# Patient Record
Sex: Female | Born: 1985
Health system: Southern US, Community
[De-identification: ages and names within clinical notes are randomized; demographics above are authoritative.]

## PROBLEM LIST (undated history)

## (undated) DIAGNOSIS — B370 Candidal stomatitis: Secondary | ICD-10-CM

## (undated) DIAGNOSIS — E871 Hypo-osmolality and hyponatremia: Secondary | ICD-10-CM

## (undated) DIAGNOSIS — B59 Pneumocystosis: Secondary | ICD-10-CM

## (undated) DIAGNOSIS — N390 Urinary tract infection, site not specified: Secondary | ICD-10-CM

## (undated) DIAGNOSIS — Z21 Asymptomatic human immunodeficiency virus [HIV] infection status: Secondary | ICD-10-CM

## (undated) DIAGNOSIS — R091 Pleurisy: Secondary | ICD-10-CM

## (undated) DIAGNOSIS — L309 Dermatitis, unspecified: Secondary | ICD-10-CM

## (undated) DIAGNOSIS — B3781 Candidal esophagitis: Secondary | ICD-10-CM

## (undated) DIAGNOSIS — J189 Pneumonia, unspecified organism: Secondary | ICD-10-CM

## (undated) DIAGNOSIS — D649 Anemia, unspecified: Secondary | ICD-10-CM

## (undated) DIAGNOSIS — B20 Human immunodeficiency virus [HIV] disease: Secondary | ICD-10-CM

## (undated) HISTORY — DX: Pneumocystosis: B59

## (undated) HISTORY — DX: Pneumonia, unspecified organism: J18.9

## (undated) HISTORY — PX: NO PAST SURGERIES: SHX2092

## (undated) HISTORY — DX: Anemia, unspecified: D64.9

## (undated) HISTORY — DX: Candidal stomatitis: B37.0

## (undated) HISTORY — DX: Pleurisy: R09.1

## (undated) HISTORY — DX: Hypo-osmolality and hyponatremia: E87.1

## (undated) HISTORY — DX: Dermatitis, unspecified: L30.9

## (undated) HISTORY — DX: Candidal esophagitis: B37.81

---

## 2009-03-02 ENCOUNTER — Emergency Department (HOSPITAL_COMMUNITY): Admission: EM | Admit: 2009-03-02 | Discharge: 2009-03-02 | Payer: Self-pay | Admitting: Emergency Medicine

## 2010-04-25 LAB — URINALYSIS, ROUTINE W REFLEX MICROSCOPIC
Bilirubin Urine: NEGATIVE
Nitrite: NEGATIVE
Protein, ur: NEGATIVE mg/dL
Specific Gravity, Urine: 1.017 (ref 1.005–1.030)
Urobilinogen, UA: 0.2 mg/dL (ref 0.0–1.0)

## 2010-04-25 LAB — URINE CULTURE
Colony Count: NO GROWTH
Culture: NO GROWTH

## 2010-04-25 LAB — PREGNANCY, URINE: Preg Test, Ur: NEGATIVE

## 2010-04-25 LAB — URINE MICROSCOPIC-ADD ON

## 2011-12-03 ENCOUNTER — Emergency Department (HOSPITAL_COMMUNITY)
Admission: EM | Admit: 2011-12-03 | Discharge: 2011-12-03 | Disposition: A | Discharge status: Home / Self Care | Payer: BC Managed Care – PPO | Attending: Emergency Medicine | Admitting: Emergency Medicine

## 2011-12-03 ENCOUNTER — Encounter (HOSPITAL_COMMUNITY): Payer: Self-pay | Admitting: Emergency Medicine

## 2011-12-03 DIAGNOSIS — M7918 Myalgia, other site: Secondary | ICD-10-CM

## 2011-12-03 DIAGNOSIS — Z87440 Personal history of urinary (tract) infections: Secondary | ICD-10-CM | POA: Insufficient documentation

## 2011-12-03 DIAGNOSIS — N898 Other specified noninflammatory disorders of vagina: Secondary | ICD-10-CM | POA: Insufficient documentation

## 2011-12-03 DIAGNOSIS — IMO0001 Reserved for inherently not codable concepts without codable children: Secondary | ICD-10-CM | POA: Insufficient documentation

## 2011-12-03 DIAGNOSIS — F17200 Nicotine dependence, unspecified, uncomplicated: Secondary | ICD-10-CM | POA: Insufficient documentation

## 2011-12-03 HISTORY — DX: Urinary tract infection, site not specified: N39.0

## 2011-12-03 LAB — URINE MICROSCOPIC-ADD ON

## 2011-12-03 LAB — URINALYSIS, ROUTINE W REFLEX MICROSCOPIC
Bilirubin Urine: NEGATIVE
Ketones, ur: NEGATIVE mg/dL
Nitrite: NEGATIVE
Protein, ur: NEGATIVE mg/dL
Specific Gravity, Urine: 1.021 (ref 1.005–1.030)
Urobilinogen, UA: 1 mg/dL (ref 0.0–1.0)

## 2011-12-03 MED ORDER — IBUPROFEN 400 MG PO TABS: 600.0000 mg | ORAL_TABLET | Freq: Once | ORAL | Status: DC

## 2011-12-03 MED ORDER — HYDROCODONE-ACETAMINOPHEN 5-325 MG PO TABS
2.0000 | ORAL_TABLET | Freq: Once | ORAL | Status: AC
  Administered 2011-12-03: 2 via ORAL
  Filled 2011-12-03: qty 2

## 2011-12-03 MED ORDER — HYDROCODONE-ACETAMINOPHEN 5-500 MG PO TABS: 1.0000 | ORAL_TABLET | Freq: Four times a day (QID) | ORAL | Status: DC | PRN

## 2011-12-03 NOTE — ED Notes (Signed)
Pt reports Right flank pain onset Thursday but has worsened tonight

## 2011-12-03 NOTE — ED Notes (Signed)
POCT preg NEG

## 2011-12-03 NOTE — ED Provider Notes (Signed)
Complaint of right flank pain onset 3 days ago. Pain is worse with moving or changing positions improved with remains still it became worse last night when she coughed one time she denies urinary symptoms she denies abdominal pain pain is nonradiating. Currently on menses. No other associated symptoms. On exam no acute distress lungs clear heart regular rate and rhythm abdomen nondistended normal active bowel sounds nontender. Back left-sided paralumbar pain exacerbated by sick sitting up from supine position.  Pain felt to be muscular in etiology    Orlie Dakin, MD 12/03/11 [id number]

## 2011-12-03 NOTE — ED Notes (Signed)
Pt presents with c/c of R sided flank pain that started this past Thursday (10/24).  Pt st's she's had UTIs in the past and this is the same type of pain.

## 2011-12-03 NOTE — ED Provider Notes (Signed)
History     CSN: [id number]  Arrival date & time 12/03/11  0448   First MD Initiated Contact with Patient 12/03/11 [phone]      Chief Complaint  Patient presents with  . Flank Pain    (Consider location/radiation/quality/duration/timing/severity/associated sxs/prior treatment) Patient is a 26 y.o. female presenting with flank pain. The history is provided by the patient. No language interpreter was used.  Flank Pain This is a new problem. The current episode started in the past 7 days. The problem occurs constantly. The problem has been gradually worsening. Associated symptoms include abdominal pain. Pertinent negatives include no chills, fever, nausea, rash, vomiting or weakness. The symptoms are aggravated by twisting and bending. She has tried nothing for the symptoms.   R flank/RLQ pain since last Thursday. Patient is on her period.  States that it feels like she has a uti.  Denies dysuria, frequency, or fever.    Past Medical History  Diagnosis Date  . UTI (urinary tract infection)     History reviewed. No pertinent past surgical history.  History reviewed. No pertinent family history.  History  Substance Use Topics  . Smoking status: Current Some Day Smoker  . Smokeless tobacco: Not on file  . Alcohol Use: Yes    OB History    Grav Para Term Preterm Abortions TAB SAB Ect Mult Living                  Review of Systems  Constitutional: Negative.  Negative for fever and chills.  HENT: Negative.   Eyes: Negative.   Respiratory: Negative.  Negative for shortness of breath.   Cardiovascular: Negative.   Gastrointestinal: Positive for abdominal pain. Negative for nausea and vomiting.  Genitourinary: Positive for flank pain and vaginal bleeding. Negative for dysuria, urgency, frequency, vaginal discharge and difficulty urinating.  Musculoskeletal: Positive for back pain. Negative for gait problem.       R flank  Skin: Negative for rash.  Neurological: Negative.   Negative for dizziness, weakness and light-headedness.  Psychiatric/Behavioral: Negative.   All other systems reviewed and are negative.    Allergies  Review of patient's allergies indicates no known allergies.  Home Medications  No current outpatient prescriptions on file.  BP 130/94  Pulse 90  Temp 98.5 F (36.9 C) (Oral)  Resp 18  Ht 5\' 1"  (1.549 m)  Wt 110 lb (49.896 kg)  BMI 20.78 kg/m2  SpO2 100%  LMP 12/03/2011  Physical Exam  Nursing note and vitals reviewed. Constitutional: She is oriented to person, place, and time. She appears well-developed and well-nourished.  HENT:  Head: Normocephalic and atraumatic.  Eyes: Conjunctivae normal and EOM are normal. Pupils are equal, round, and reactive to light.  Neck: Normal range of motion. Neck supple.  Cardiovascular: Normal rate, regular rhythm, normal heart sounds and intact distal pulses.  Exam reveals no gallop and no friction rub.   No murmur heard. Pulmonary/Chest: Effort normal and breath sounds normal. No respiratory distress.  Abdominal: Soft. Bowel sounds are normal. She exhibits no distension. There is tenderness. There is no rebound and no guarding.       Suprapubic tender  Genitourinary: Guaiac negative stool. No vaginal discharge found.  Musculoskeletal: Normal range of motion. She exhibits no edema and no tenderness.  Neurological: She is alert and oriented to person, place, and time. She has normal reflexes.  Skin: Skin is warm and dry.  Psychiatric: She has a normal mood and affect.    ED Course  Procedures (including critical care time)   Labs Reviewed  URINALYSIS, ROUTINE W REFLEX MICROSCOPIC   No results found.   No diagnosis found.    MDM  Flank pain better after vicodin.  U/a show no infection.  Vss. Afebrile .  Doubt kidney stone. Follow up with pcp tomorrow.  Return for fever, n/v or severe pain.  Shared visit with dr. Cathleen Fears.        Julieta Bellini, NP 12/03/11 1927

## 2013-03-11 ENCOUNTER — Emergency Department (HOSPITAL_COMMUNITY)
Admission: EM | Admit: 2013-03-11 | Discharge: 2013-03-11 | Disposition: A | Discharge status: Home / Self Care | Payer: Self-pay | Attending: Emergency Medicine | Admitting: Emergency Medicine

## 2013-03-11 ENCOUNTER — Emergency Department (HOSPITAL_COMMUNITY): Payer: Self-pay

## 2013-03-11 ENCOUNTER — Encounter (HOSPITAL_COMMUNITY): Payer: Self-pay | Admitting: Emergency Medicine

## 2013-03-11 DIAGNOSIS — F17200 Nicotine dependence, unspecified, uncomplicated: Secondary | ICD-10-CM | POA: Insufficient documentation

## 2013-03-11 DIAGNOSIS — Z87440 Personal history of urinary (tract) infections: Secondary | ICD-10-CM | POA: Insufficient documentation

## 2013-03-11 DIAGNOSIS — R000 Tachycardia, unspecified: Secondary | ICD-10-CM | POA: Insufficient documentation

## 2013-03-11 DIAGNOSIS — J029 Acute pharyngitis, unspecified: Secondary | ICD-10-CM | POA: Insufficient documentation

## 2013-03-11 DIAGNOSIS — J189 Pneumonia, unspecified organism: Secondary | ICD-10-CM

## 2013-03-11 DIAGNOSIS — J159 Unspecified bacterial pneumonia: Secondary | ICD-10-CM | POA: Insufficient documentation

## 2013-03-11 MED ORDER — AZITHROMYCIN 250 MG PO TABS
250.0000 mg | ORAL_TABLET | Freq: Every day | ORAL | Status: DC
Start: 2013-03-11 — End: 2015-12-10

## 2013-03-11 MED ORDER — ACETAMINOPHEN 325 MG PO TABS
650.0000 mg | ORAL_TABLET | Freq: Once | ORAL | Status: AC
  Administered 2013-03-11: 650 mg via ORAL
  Filled 2013-03-11: qty 2

## 2013-03-11 NOTE — ED Notes (Signed)
Pt c/o chills and generalized body aches x 2 weeks.  Pain score 6/10.

## 2013-03-11 NOTE — Discharge Instructions (Signed)
Follow up with [facility] health Community health and wellness in 2 days for reevaluation. Take full course of antibiotics. Avoid smoking. Practice good hand hygiene. Return to ED should you develop worsening symptoms, chest pain, or shortness of breath.    Emergency Department Resource Guide 1) Find a Doctor and Pay Out of Pocket Although you won't have to find out who is covered by your insurance plan, it is a good idea to ask around and get recommendations. You will then need to call the office and see if the doctor you have chosen will accept you as a new patient and what types of options they offer for patients who are self-pay. Some doctors offer discounts or will set up payment plans for their patients who do not have insurance, but you will need to ask so you aren't surprised when you get to your appointment.  2) Contact Your Local Health Department Not all health departments have doctors that can see patients for sick visits, but many do, so it is worth a call to see if yours does. If you don't know where your local health department is, you can check in your phone book. The CDC also has a tool to help you locate your state's health department, and many state websites also have listings of all of their local health departments.  3) Find a [facility] If your illness is not likely to be very severe or complicated, you may want to try a walk in clinic. These are popping up all over the country in pharmacies, drugstores, and shopping centers. They're usually staffed by nurse practitioners or physician assistants that have been trained to treat common illnesses and complaints. They're usually fairly quick and inexpensive. However, if you have serious medical issues or chronic medical problems, these are probably not your best option.  No Primary Care Doctor: - Call Health Connect at  [phone] - they can help you locate a primary care doctor that  accepts your insurance, provides certain services,  etc. - Physician Referral Service- [phone]  Chronic Pain Problems: Organization         Address  Phone   Notes  [facility]  [phone] Patients need to be referred by their primary care doctor.   Medication Assistance: Organization         Address  Phone   Notes  [facility] Medication [facility] [address] [place], [place] [address] [phone] --Must be a resident of [facility] -- Must have NO insurance coverage whatsoever (no Medicaid/ Medicare, etc.) -- The pt. MUST have a primary care doctor that directs their care regularly and follows them in the community   Med[facility]ist  [phone]   [employer]  [phone]    Agencies that provide inexpensive medical care: Organization         Address  Phone   Notes  [place]  [phone]   [facility]    [phone]   [facility] [place], [place] [address] [phone]   [place] [address] [place] [phone]   Planned Parenthood    [phone]   [facility]    [phone]   [place] and [place] [address], [place] Phone:  [phone], Fax:  [phone] Hours of Operation:  9 am - 6 pm, M-F.  Also accepts Medicaid/Medicare and  self-pay.  [facility] for [place], [address], [place] Phone: [phone], Fax: [phone]. Hours of Operation:  8:30 am - 5:30 pm, M-F.  Also accepts Medicaid and self-pay.  [facility] [place] [address] [place] Phone: [phone]   [place], [place], [place] [phone], Ext. [phone] Mondays & Thursdays: 7-9 AM.  First 15 patients are seen on a first come, first serve basis.    [place] Providers:  Organization         Address  Phone   Notes  [facility] [address], [place] [phone] Also accepts self-pay patients.  [facility] [id number] [place], [place]  [phone]   [place], [address], [place] [phone]   [facility] [address], [place] [phone]   Lucianne Lei [address], [place]   [phone] Only accepts [place] Access [place] patients after they have their name applied to their card.   Self-Pay (no insurance) in [facility]:  Organization         Address  Phone   Notes  Sickle Cell Patients, [facility] [place] [phone]   [facility] [place] [phone]   [facility] [place]  [place], [place], [place] [phone]   [facility]/Dr. Osei-Bonsu  [address], [place] or [address], [place] [phone] Phone number for both [place] and [place] locations is the same.  Urgent Medical and [facility] [place] [address] [place] [phone]   [facility] [address], [place] or [address] [phone]   [facility] [address] [place] [phone], phone; [phone], fax Sees patients 1st and 3rd Saturday of every month.  Must not qualify for public or private insurance (i.e. Medicaid, Medicare, [place] Health Choice, Veterans' Benefits)  Household income should be no more than 200% of the poverty level The clinic cannot treat you if you are pregnant or think you are pregnant  Sexually transmitted diseases are not treated at the clinic.    Dental Care: Organization         Address  Phone  Notes  [facility] of [facility] [place] [phone] Accepts children up to  age 32 who are enrolled in [place] or [place]; pregnant women with a Medicaid card; and children who have applied for Medicaid or [place] Health Choice, but were declined, whose parents can pay a reduced fee at time of service.  [facility] Department of [facility]  [address], [place] [phone] Accepts children up to age 69 who are enrolled in [place] or [place]; pregnant women with a Medicaid card; and children who have applied for Medicaid or [place] Health Choice, but were declined, whose parents can pay a reduced fee at time of service.  [facility] Adult Dental Access PROGRAM  [place] [phone] Patients are seen by appointment only. Walk-ins are not accepted. [place] will see patients 72 years of age and older. Monday - Tuesday (8am-5pm) Most Wednesdays (8:30-5pm) $  30 per visit, cash only  [facility] Adult [employer] PROGRAM  [address], [facility] [phone] Patients are seen by appointment only. Walk-ins are not accepted. [place] will see patients 41 years of age and older. One Wednesday Evening (Monthly: Volunteer Based).  $30 per visit, cash only  [place]  [phone] for adults; Children under age 58, call Graduate Pediatric Dentistry at [phone]. Children aged 52-14, please call [phone] to request a pediatric application.  Dental services are provided in all areas of dental care including fillings, crowns and bridges, complete and partial dentures, implants, gum treatment, root canals, and extractions. Preventive care is also provided. Treatment is provided to both adults and children. Patients are selected via a lottery and there is often a waiting list.   [facility] [address], [place]  [phone] [email or web address]   [facility] [address] [place], [place] [phone], Ext. [phone] Second and Fourth Thursday of  each month, opens at 6:30 AM; Clinic ends at 9 AM.  Patients are seen on a first-come first-served basis, and a limited number are seen during each clinic.   [facility]  [address] Hillard Danker [place], [place] [phone]   Eligibility Requirements You must have lived in [place], [place], or [place] counties for at least the last three months.   You cannot be eligible for state or federal sponsored [employer], including [employer], [place], or [employer].   You generally cannot be eligible for healthcare insurance through your employer.    How to apply: Eligibility screenings are held every Tuesday and Wednesday afternoon from 1:00 pm until 4:00 pm. You do not need an appointment for the interview!  [facility] [address] [place], [place]   [place]  [place] Department  [place]  [phone]    Behavioral Health Resources in the Community: Intensive Outpatient Programs Organization         Address  Phone  Notes  [place]. [address], [place], [place] [phone]   [facility]patient [address] [place], [place]   ADS: Alcohol & Drug Svcs [address] [place], [place]   [place] [address],  [place], [place] or [phone]   Substance Abuse Resources Organization         Address  Phone  Notes  Alcohol and Drug Services  [phone]   [place]  [phone]   [place]   Chinita Pester  [phone]   Residential & Outpatient Substance Abuse Program  [phone]   Psychological Services Organization         Address  Phone  Notes  [facility] [place]  [place]  [phone]   [place] [address]  [place] or [phone]    Mobile Crisis Teams Organization         Address  Phone  Notes  [facility]rapeutic [facility]atives, Mobile Crisis Care Unit  [phone]   Assertive Psychotherapeutic Services  [address] [place], [place]   [place] Levels [address] [place] [phone]    Self-Help/Support Groups Organization         Address  Phone  Notes  Mental Health Assoc. of [place] - variety of support groups  [place] Call for more information  Narcotics Anonymous (NA), Caring Services [address], [employer] [place]  2 meetings at this location   Special educational needs teacher         Address  Phone  Notes  ASAP Residential Treatment [place],    [place]  [phone]   [facility]  [address], [place] [phone], [place], [place]   [place], [place] [phone] Admissions: 8am-3pm M-F  Incentives Substance [place] [address],    [place], [place] [phone]   [facility] [address] [place], [place], [place]   [facility] [address],  [place], [place]   [facility]ight Programs - Intensive Outpatient [address] Kristeen [address], [place], [place]   [facility] ([place].) [place].,  [place], [place] [phone] or [phone]   [facility]idential Treatment Services (RTS) [address] [place], [place] Accepts Medicaid  [facility] [place] [address],  [place] [phone] Substance Abuse/Addiction Treatment   [facility] Organization         Address  Phone  Notes  [facility]  [phone]   Domenic Schwab, PhD [address] Arlis Porta [place], [place]   [phone] or [phone]   [place], [place] [phone]     [facility] [address] [place], [place] [phone] Insurance/Medicaid/sponsorship through [facility] and Families [address], [address] [place]                                    [place], [place] [phone] [place] [address], [place] [phone]    Dr. Adele Schilder  [phone]   [facility] Dept. 1) [address]  2) [place] 3)  [place] [address], [place] [phone]  [phone]   [place] [phone] or [phone] (After Hours)

## 2013-03-11 NOTE — Progress Notes (Signed)
[facility] provided pt with a list of primary care resources and ACA information.  °

## 2013-03-11 NOTE — ED Provider Notes (Signed)
CSN: [id number]     Arrival date & time 03/11/13  1216 History   First MD Initiated Contact with Patient 03/11/13 1236     Chief Complaint  Patient presents with  . Chills  . Generalized Body Aches   (Consider location/radiation/quality/duration/timing/severity/associated sxs/prior Treatment) HPI 28 yo female presents with recurrent fever/chills, cough, sore throat, and body aches. Patient states it started about 3 weeks ago and then got better with OTC medications, then started back up. Sxs got a little bit better and then worsened again. Patient states she has been taking care of her god son recently who has been sick with a cough. Patient denies any difficulty swallowing, HA, Chest pain, SOB, abdominal pain, N/V/D/C, Urinary sxs, neck pain/stiffness, or rash. Patient states sxs improved with OTC medications but continue to return. Patient is a current every other day smoker x 5 years. Admits to alcohol use on the weekends. Denies any pertinent PMH.  Past Medical History  Diagnosis Date  . UTI (urinary tract infection)    History reviewed. No pertinent past surgical history. History reviewed. No pertinent family history. History  Substance Use Topics  . Smoking status: Current Some Day Smoker    Types: Cigarettes  . Smokeless tobacco: Never Used  . Alcohol Use: Yes   OB History   Grav Para Term Preterm Abortions TAB SAB Ect Mult Living                 Review of Systems  All other systems reviewed and are negative.    Allergies  Review of patient's allergies indicates no known allergies.  Home Medications   Current Outpatient Rx  Name  Route  Sig  Dispense  Refill  . Phenylephrine-DM-GG-APAP (TYLENOL COLD/FLU SEVERE) 5-10-200-325 MG TABS   Oral   Take 1 tablet by mouth 2 (two) times daily as needed (flu-like symptoms).         Marland Kitchen azithromycin (ZITHROMAX) 250 MG tablet   Oral   Take 1 tablet (250 mg total) by mouth daily. Take first 2 tablets together, then 1 every day  until finished.   6 tablet   0    BP 94/57  Pulse 92  Temp(Src) 97.8 F (36.6 C) (Oral)  Resp 16  SpO2 100%  LMP 03/09/2013 Physical Exam  Nursing note and vitals reviewed. Constitutional: She is oriented to person, place, and time. She appears well-developed and well-nourished. No distress.  HENT:  Head: Normocephalic and atraumatic.  Eyes: Conjunctivae and EOM are normal. No scleral icterus.  Neck: Normal range of motion. Neck supple.  Cardiovascular: Regular rhythm.  Tachycardia present.  Exam reveals no gallop and no friction rub.   No murmur heard. Pulmonary/Chest: Effort normal and breath sounds normal. No respiratory distress. She has no wheezes. She has no rales.  Musculoskeletal: Normal range of motion. She exhibits no edema.  Neurological: She is alert and oriented to person, place, and time.  Skin: Skin is warm and dry. She is not diaphoretic.  Psychiatric: She has a normal mood and affect. Her behavior is normal.    ED Course  Procedures (including critical care time) Labs Review Labs Reviewed - No data to display Imaging Review Dg Chest 2 View  03/11/2013   CLINICAL DATA:  Two week history of fever and chills and body aches  EXAM: CHEST  2 VIEW  COMPARISON:  None.  FINDINGS: The lungs are adequately inflated. There are patchy increased interstitial densities anteriorly in the right lower lobe. There is no  alveolar infiltrate. There is no pleural effusion or pneumothorax. The cardiac silhouette is normal in size. The pulmonary vascularity is not engorged. The mediastinum is normal in width. The bony thorax exhibits no acute abnormality  IMPRESSION: The findings are consistent with interstitial type pneumonia in the anterior aspect of the right lower lobe.   Electronically Signed   By: David  Martinique   On: 03/11/2013 14:09    EKG Interpretation   None       MDM   1. CAP (community acquired pneumonia)    Patient afebrile. O2 sat 100% on RA. Patient tachycardic  at admission, resolved prior to discharge.   CXR shows interstitial type pneumonia. Plan to treat patient for CAP and have [facility].  Resource guide provided for additional follow up.   Meds given in ED:  Medications  acetaminophen (TYLENOL) tablet 650 mg (650 mg Oral Given 03/11/13 1321)    Discharge Medication List as of 03/11/2013  2:39 PM    START taking these medications   Details  azithromycin (ZITHROMAX) 250 MG tablet Take 1 tablet (250 mg total) by mouth daily. Take first 2 tablets together, then 1 every day until finished., Starting 03/11/2013, Until Discontinued, Print            Dossie Arbour [place], [place] 03/11/13 2339

## 2013-03-11 NOTE — ED Notes (Signed)
Patient transported to X-ray 

## 2013-03-12 NOTE — ED Provider Notes (Signed)
Medical screening examination/treatment/procedure(s) were conducted as a shared visit with non-physician practitioner(s) or resident  and myself.  I personally evaluated the patient during the encounter and agree with the findings and plan unless otherwise indicated.    I have personally reviewed any xrays and/ or EKG's with the provider and I agree with interpretation.   Fever, chills and body aches with cough.  Few rales on exam, no distress, no murmurs, abd soft/ NT, neck supple, mild dry mm.  CXR reviewed, small infiltrate right lower.  CAP abx, fup discussed.  Non toxic appearing in ED.   CAP, Flu sxs  Mariea Clonts, MD 03/12/13 1622

## 2014-12-21 ENCOUNTER — Emergency Department (HOSPITAL_COMMUNITY)
Admission: EM | Admit: 2014-12-21 | Discharge: 2014-12-21 | Disposition: A | Discharge status: Home / Self Care | Payer: Self-pay | Attending: Emergency Medicine | Admitting: Emergency Medicine

## 2014-12-21 ENCOUNTER — Encounter (HOSPITAL_COMMUNITY): Payer: Self-pay | Admitting: Family Medicine

## 2014-12-21 DIAGNOSIS — M545 Low back pain, unspecified: Secondary | ICD-10-CM

## 2014-12-21 DIAGNOSIS — J029 Acute pharyngitis, unspecified: Secondary | ICD-10-CM | POA: Insufficient documentation

## 2014-12-21 DIAGNOSIS — J069 Acute upper respiratory infection, unspecified: Secondary | ICD-10-CM | POA: Insufficient documentation

## 2014-12-21 DIAGNOSIS — Z87440 Personal history of urinary (tract) infections: Secondary | ICD-10-CM | POA: Insufficient documentation

## 2014-12-21 DIAGNOSIS — Z720 Tobacco use: Secondary | ICD-10-CM | POA: Insufficient documentation

## 2014-12-21 MED ORDER — ALBUTEROL SULFATE HFA 108 (90 BASE) MCG/ACT IN AERS
2.0000 | INHALATION_SPRAY | Freq: Once | RESPIRATORY_TRACT | Status: AC
  Administered 2014-12-21: 2 via RESPIRATORY_TRACT
  Filled 2014-12-21: qty 6.7

## 2014-12-21 NOTE — Discharge Instructions (Signed)
Read the information below.  You may return to the Emergency Department at any time for worsening condition or any new symptoms that concern you.  If you develop fevers, loss of control of bowel or bladder, weakness or numbness in your legs, or are unable to walk, return to the ER for a recheck.  If you develop high fevers that do not resolve with tylenol or ibuprofen, you have difficulty swallowing or breathing, or you are unable to tolerate fluids by mouth, return to the ER for a recheck.     Please follow up with your regular doctor.  If you miss your period next week please take a pregnancy test.  In the meantime, given your midcycle spotting, use medications that are considered safe in pregnancy.  Please see the attached list.  Take tylenol only for pain.      Back Pain, Adult Back pain is very common in adults.The cause of back pain is rarely dangerous and the pain often gets better over time.The cause of your back pain may not be known. Some common causes of back pain include:  Strain of the muscles or ligaments supporting the spine.  Wear and tear (degeneration) of the spinal disks.  Arthritis.  Direct injury to the back. For many people, back pain may return. Since back pain is rarely dangerous, most people can learn to manage this condition on their own. HOME CARE INSTRUCTIONS Watch your back pain for any changes. The following actions may help to lessen any discomfort you are feeling:  Remain active. It is stressful on your back to sit or stand in one place for long periods of time. Do not sit, drive, or stand in one place for more than 30 minutes at a time. Take short walks on even surfaces as soon as you are able.Try to increase the length of time you walk each day.  Exercise regularly as directed by your health care provider. Exercise helps your back heal faster. It also helps avoid future injury by keeping your muscles strong and flexible.  Do not stay in bed.Resting more  than 1-2 days can delay your recovery.  Pay attention to your body when you bend and lift. The most comfortable positions are those that put less stress on your recovering back. Always use proper lifting techniques, including:  Bending your knees.  Keeping the load close to your body.  Avoiding twisting.  Find a comfortable position to sleep. Use a firm mattress and lie on your side with your knees slightly bent. If you lie on your back, put a pillow under your knees.  Avoid feeling anxious or stressed.Stress increases muscle tension and can worsen back pain.It is important to recognize when you are anxious or stressed and learn ways to manage it, such as with exercise.  Take medicines only as directed by your health care provider. Over-the-counter medicines to reduce pain and inflammation are often the most helpful.Your health care provider may prescribe muscle relaxant drugs.These medicines help dull your pain so you can more quickly return to your normal activities and healthy exercise.  Apply ice to the injured area:  Put ice in a plastic bag.  Place a towel between your skin and the bag.  Leave the ice on for 20 minutes, 2-3 times a day for the first 2-3 days. After that, ice and heat may be alternated to reduce pain and spasms.  Maintain a healthy weight. Excess weight puts extra stress on your back and makes it difficult to  maintain good posture. SEEK MEDICAL CARE IF:  You have pain that is not relieved with rest or medicine.  You have increasing pain going down into the legs or buttocks.  You have pain that does not improve in one week.  You have night pain.  You lose weight.  You have a fever or chills. SEEK IMMEDIATE MEDICAL CARE IF:   You develop new bowel or bladder control problems.  You have unusual weakness or numbness in your arms or legs.  You develop nausea or vomiting.  You develop abdominal pain.  You feel faint.   This information is not  intended to replace advice given to you by your health care provider. Make sure you discuss any questions you have with your health care provider.   Document Released: 01/23/2005 Document Revised: 02/13/2014 Document Reviewed: 05/27/2013 Elsevier Interactive Patient Education 2016 [employer]  Upper Respiratory Infection, Adult Most upper respiratory infections (URIs) are a viral infection of the air passages leading to the lungs. A URI affects the nose, throat, and upper air passages. The most common type of URI is nasopharyngitis and is typically referred to as "the common cold." URIs run their course and usually go away on their own. Most of the time, a URI does not require medical attention, but sometimes a bacterial infection in the upper airways can follow a viral infection. This is called a secondary infection. Sinus and middle ear infections are common types of secondary upper respiratory infections. Bacterial pneumonia can also complicate a URI. A URI can worsen asthma and chronic obstructive pulmonary disease (COPD). Sometimes, these complications can require emergency medical care and may be life threatening.  CAUSES Almost all URIs are caused by viruses. A virus is a type of germ and can spread from one person to another.  RISKS FACTORS You may be at risk for a URI if:   You smoke.   You have chronic heart or lung disease.  You have a weakened defense (immune) system.   You are very young or very old.   You have nasal allergies or asthma.  You work in crowded or poorly ventilated areas.  You work in health care facilities or schools. SIGNS AND SYMPTOMS  Symptoms typically develop 2-3 days after you come in contact with a cold virus. Most viral URIs last 7-10 days. However, viral URIs from the influenza virus (flu virus) can last 14-18 days and are typically more severe. Symptoms may include:   Runny or stuffy (congested) nose.   Sneezing.   Cough.   Sore  throat.   Headache.   Fatigue.   Fever.   Loss of appetite.   Pain in your forehead, behind your eyes, and over your cheekbones (sinus pain).  Muscle aches.  DIAGNOSIS  Your health care provider may diagnose a URI by:  Physical exam.  Tests to check that your symptoms are not due to another condition such as:  Strep throat.  Sinusitis.  Pneumonia.  Asthma. TREATMENT  A URI goes away on its own with time. It cannot be cured with medicines, but medicines may be prescribed or recommended to relieve symptoms. Medicines may help:  Reduce your fever.  Reduce your cough.  Relieve nasal congestion. HOME CARE INSTRUCTIONS   Take medicines only as directed by your health care provider.   Gargle warm saltwater or take cough drops to comfort your throat as directed by your health care provider.  Use a warm mist humidifier or inhale steam from a shower  to increase air moisture. This may make it easier to breathe.  Drink enough fluid to keep your urine clear or pale yellow.   Eat soups and other clear broths and maintain good nutrition.   Rest as needed.   Return to work when your temperature has returned to normal or as your health care provider advises. You may need to stay home longer to avoid infecting others. You can also use a face mask and careful hand washing to prevent spread of the virus.  Increase the usage of your inhaler if you have asthma.   Do not use any tobacco products, including cigarettes, chewing tobacco, or electronic cigarettes. If you need help quitting, ask your health care provider. PREVENTION  The best way to protect yourself from getting a cold is to practice good hygiene.   Avoid oral or hand contact with people with cold symptoms.   Wash your hands often if contact occurs.  There is no clear evidence that vitamin C, vitamin E, echinacea, or exercise reduces the chance of developing a cold. However, it is always recommended to  get plenty of rest, exercise, and practice good nutrition.  SEEK MEDICAL CARE IF:   You are getting worse rather than better.   Your symptoms are not controlled by medicine.   You have chills.  You have worsening shortness of breath.  You have brown or red mucus.  You have yellow or brown nasal discharge.  You have pain in your face, especially when you bend forward.  You have a fever.  You have swollen neck glands.  You have pain while swallowing.  You have white areas in the back of your throat. SEEK IMMEDIATE MEDICAL CARE IF:   You have severe or persistent:  Headache.  Ear pain.  Sinus pain.  Chest pain.  You have chronic lung disease and any of the following:  Wheezing.  Prolonged cough.  Coughing up blood.  A change in your usual mucus.  You have a stiff neck.  You have changes in your:  Vision.  Hearing.  Thinking.  Mood. MAKE SURE YOU:   Understand these instructions.  Will watch your condition.  Will get help right away if you are not doing well or get worse.   This information is not intended to replace advice given to you by your health care provider. Make sure you discuss any questions you have with your health care provider.   Document Released: 07/19/2000 Document Revised: 06/09/2014 Document Reviewed: 04/30/2013 Elsevier Interactive Patient Education [employer].

## 2014-12-21 NOTE — ED Provider Notes (Signed)
CSN: [id number]     Arrival date & time 12/21/14  1503 History  By signing my name below, I, Stephania Fragmin, attest that this documentation has been prepared under the direction and in the presence of [facility], PA-C. Electronically Signed: Stephania Fragmin, ED Scribe. 12/21/2014. 4:12 PM.    Chief Complaint  Patient presents with  . Back Pain  . URI   The history is provided by the patient. No language interpreter was used.    HPI Comments: Krystal Short is a 29 y.o. female who presents to the Emergency Department complaining of a gradual-onset, constant, waxing and waning, non-productive cough for the past 2 weeks with associated congestion and sore throat. She states her symptoms seemed to have started out as a cold, but she is concerned about how persistent her symptoms have been. She states she has been taking Mucinex, Theraflu, and ibuprofen for her symptoms with moderate relief. Patient notes she had just returned from the [place] for vacation yesterday but states she was sick before that.   Patient also complains of atraumatic lower back pain that began when she woke up 2 weeks ago. She states her back pain is exacerbated by bending over, sitting straight, and coughing. She notes she has had increased difficulty with heavy lifting in her job secondary to her back discomfort. She states there is a possibility of pregnancy, as she has been having unprotected sex with her fiance. She also mentions having vaginal spotting 2 days ago, which is unusual for her. Patient's LMP was on 11/27/14 and was completely normal.   Patient denies SOB, wheezing, burning with urination, urinary frequency or urgency, abdominal pain, vaginal discharge, vaginal bleeding, nausea, vomiting, or loss of appetite.   Past Medical History  Diagnosis Date  . UTI (urinary tract infection)    History reviewed. No pertinent past surgical history. History reviewed. No pertinent family history. Social History   Substance Use Topics  . Smoking status: Current Some Day Smoker    Types: Cigarettes  . Smokeless tobacco: Never Used  . Alcohol Use: Yes   OB History    No data available     Review of Systems  Constitutional: Negative for appetite change.  HENT: Positive for congestion and sore throat.   Respiratory: Positive for cough. Negative for shortness of breath and wheezing.   Gastrointestinal: Negative for nausea and abdominal pain.  Genitourinary: Negative for dysuria, urgency, frequency, vaginal bleeding and vaginal discharge.  Musculoskeletal: Positive for back pain.  All other systems reviewed and are negative.    Allergies  Review of patient's allergies indicates no known allergies.  Home Medications   Prior to Admission medications   Medication Sig Start Date End Date Taking? Authorizing Provider  azithromycin (ZITHROMAX) 250 MG tablet Take 1 tablet (250 mg total) by mouth daily. Take first 2 tablets together, then 1 every day until finished. 03/11/13   Maude Leriche, PA-C  Phenylephrine-DM-GG-APAP (TYLENOL COLD/FLU SEVERE) 5-10-200-325 MG TABS Take 1 tablet by mouth 2 (two) times daily as needed (flu-like symptoms).    Historical Provider, MD   BP 115/74 mmHg  Pulse 85  Temp(Src) 97.7 F (36.5 C)  Resp 18  SpO2 100%  LMP 11/27/2014 Physical Exam  Constitutional: She appears well-developed and well-nourished. No distress.  HENT:  Head: Normocephalic and atraumatic.  Neck: Neck supple.  Cardiovascular: Normal rate and regular rhythm.   Pulmonary/Chest: Effort normal and breath sounds normal. No respiratory distress. She has no wheezes. She has no rales.  Abdominal: Soft. She exhibits no distension and no mass. There is no tenderness. There is no rebound and no guarding.  Musculoskeletal:  Spine nontender, no crepitus, or stepoffs. Lower extremities:  Strength 5/5, sensation intact, distal pulses intact.    Normal gait.   Neurological: She is alert.  Skin: She is not  diaphoretic.  Nursing note and vitals reviewed.   ED Course  Procedures (including critical care time)  DIAGNOSTIC STUDIES: Oxygen Saturation is 100% on RA, normal by my interpretation.    COORDINATION OF CARE: 3:43 PM - Discussed treatment plan with pt at bedside. Pt verbalized understanding and agreed to plan.   MDM   Final diagnoses:  URI (upper respiratory infection)  Bilateral low back pain without sciatica    Afebrile, nontoxic patient with URI symptoms x 2 weeks, lungs CTAB and O2 100% on room air.  Rare cough heard from the room.  Also complaining of muscular back pain - all worse with movement.  She had no GI/GU symptoms but did have some vaginal spotting a few days ago that is atypical for her in the middle of her cycle.  Given her mild symptoms and her possibility of pregnancy (though it would be extremely early pregnancy unlikely to show up on pregnancy test) I have advised her to use conservative management of her symptoms with home pregnancy test in 1-2 weeks and close PCP follow up.   D/C home with information regarding medications to use in pregnancy, if applicable.  Discussed result, findings, treatment, and follow up  with patient.  Pt given return precautions.  Pt verbalizes understanding and agrees with plan.       I personally performed the services described in this documentation, which was scribed in my presence. The recorded information has been reviewed and is accurate.     Clayton Bibles, PA-C 12/21/14 1746  Quintella Reichert, MD 12/25/14 [phone]

## 2014-12-21 NOTE — ED Notes (Signed)
Pt here for URI symptoms and back pain with coughing and moving x 2 weeks. Denies any urinary or vaginal complaints.

## 2014-12-24 ENCOUNTER — Emergency Department (HOSPITAL_COMMUNITY)
Admission: EM | Admit: 2014-12-24 | Discharge: 2014-12-24 | Disposition: A | Discharge status: Home / Self Care | Payer: Self-pay | Attending: Emergency Medicine | Admitting: Emergency Medicine

## 2014-12-24 ENCOUNTER — Encounter (HOSPITAL_COMMUNITY): Payer: Self-pay | Admitting: Emergency Medicine

## 2014-12-24 ENCOUNTER — Emergency Department (HOSPITAL_COMMUNITY): Payer: Self-pay

## 2014-12-24 DIAGNOSIS — Z87440 Personal history of urinary (tract) infections: Secondary | ICD-10-CM | POA: Insufficient documentation

## 2014-12-24 DIAGNOSIS — Z792 Long term (current) use of antibiotics: Secondary | ICD-10-CM | POA: Insufficient documentation

## 2014-12-24 DIAGNOSIS — F17210 Nicotine dependence, cigarettes, uncomplicated: Secondary | ICD-10-CM | POA: Insufficient documentation

## 2014-12-24 DIAGNOSIS — J069 Acute upper respiratory infection, unspecified: Secondary | ICD-10-CM | POA: Insufficient documentation

## 2014-12-24 LAB — RAPID STREP SCREEN (MED CTR MEBANE ONLY): STREPTOCOCCUS, GROUP A SCREEN (DIRECT): NEGATIVE

## 2014-12-24 MED ORDER — IBUPROFEN 800 MG PO TABS
800.0000 mg | ORAL_TABLET | Freq: Once | ORAL | Status: AC
  Administered 2014-12-24: 800 mg via ORAL
  Filled 2014-12-24: qty 1

## 2014-12-24 NOTE — ED Provider Notes (Signed)
CSN: [id number]     Arrival date & time 12/24/14  1015 History   First MD Initiated Contact with Patient 12/24/14 1103     Chief Complaint  Patient presents with  . URI     (Consider location/radiation/quality/duration/timing/severity/associated sxs/prior Treatment) HPI  Pulse 94, temperature 97.5 F (36.4 C), resp. rate 20, last menstrual period 11/27/2014, SpO2 98 %.  Krystal Short is a 29 y.o. female complaining of rhinorrhea, sore throat, dry cough onset approximately 10 days ago. Patient has low back pain which is exacerbated by cough onset several days ago. She denies fever, chills, nausea, vomiting, chest pain, shortness of breath, change in bowel or bladder habits. She has been using inhaler at home with little relief. Patient denies dysuria, hematuria, urinary frequency, urinary discharge, difficulty ambulating, saddle anesthesia, numbness, weakness, history of cancer, history of IV drug use.  Past Medical History  Diagnosis Date  . UTI (urinary tract infection)    History reviewed. No pertinent past surgical history. No family history on file. Social History  Substance Use Topics  . Smoking status: Current Some Day Smoker    Types: Cigarettes  . Smokeless tobacco: Never Used  . Alcohol Use: Yes   OB History    No data available     Review of Systems  10 systems reviewed and found to be negative, except as noted in the HPI.   Allergies  Review of patient's allergies indicates no known allergies.  Home Medications   Prior to Admission medications   Medication Sig Start Date End Date Taking? Authorizing Provider  azithromycin (ZITHROMAX) 250 MG tablet Take 1 tablet (250 mg total) by mouth daily. Take first 2 tablets together, then 1 every day until finished. 03/11/13   Maude Leriche, PA-C  Phenylephrine-DM-GG-APAP (TYLENOL COLD/FLU SEVERE) 5-10-200-325 MG TABS Take 1 tablet by mouth 2 (two) times daily as needed (flu-like symptoms).    Historical Provider, MD    Pulse 94  Temp(Src) 97.5 F (36.4 C)  Resp 20  SpO2 98%  LMP 11/27/2014 Physical Exam  Constitutional: She is oriented to person, place, and time. She appears well-developed and well-nourished. No distress.  HENT:  Head: Normocephalic and atraumatic.  Mouth/Throat: Oropharynx is clear and moist.  Eyes: Conjunctivae and EOM are normal. Pupils are equal, round, and reactive to light.  Neck: Normal range of motion.  Cardiovascular: Normal rate, regular rhythm and intact distal pulses.   Pulmonary/Chest: Effort normal and breath sounds normal.  Abdominal: Soft. There is no tenderness.  Musculoskeletal: Normal range of motion.  Neurological: She is alert and oriented to person, place, and time.  No point tenderness to percussion of lumbar spinal processes.  No TTP or paraspinal muscular spasm. Strength is 5 out of 5 to bilateral lower extremities at hip and knee; extensor hallucis longus 5 out of 5. Ankle strength 5 out of 5, no clonus, neurovascularly intact. No saddle anaesthesia. Patellar reflexes are 2+ bilaterally.    Ambulates with a coordinated in non-antalgic gait  Skin: She is not diaphoretic.  Psychiatric: She has a normal mood and affect.  Nursing note and vitals reviewed.   ED Course  Procedures (including critical care time) Labs Review Labs Reviewed  RAPID STREP SCREEN (NOT AT [facility])  CULTURE, GROUP A STREP  URINALYSIS, ROUTINE W REFLEX MICROSCOPIC (NOT AT [facility])    Imaging Review Dg Chest 2 View  12/24/2014  CLINICAL DATA:  Chest pain, sore throat for 2 weeks EXAM: CHEST  2 VIEW COMPARISON:  03/11/2013 FINDINGS: Cardiomediastinal  silhouette is stable. No acute infiltrate or pleural effusion. No pulmonary edema. Bony thorax is unremarkable. IMPRESSION: No active cardiopulmonary disease. Electronically Signed   By: Lahoma Crocker M.D.   On: 12/24/2014 11:19   I have personally reviewed and evaluated these images and lab results as part of my medical decision-making.    EKG Interpretation None      MDM   Final diagnoses:  URI (upper respiratory infection)    Filed Vitals:   12/24/14 1031 12/24/14 1035  Pulse: 94   Temp:  97.5 F (36.4 C)  TempSrc: Oral   Resp: 20   SpO2: 98%     Medications  ibuprofen (ADVIL,MOTRIN) tablet 800 mg (800 mg Oral Given 12/24/14 1206)    Krystal Short is 29 y.o. female presenting with cough, sore throat. Vital signs stable, lung sounds are clear to auscultation. Chest x-ray negative, rapid strep negative, think this is likely a viral URI. If her low back pain is simple muscular skeletal pain related to cough. I've advised her that I do not think antibiotics would be beneficial to her at this time. Advised her to rest and push fluids. Patient verbalizes her understanding.  Evaluation does not show pathology that would require ongoing emergent intervention or inpatient treatment. Pt is hemodynamically stable and mentating appropriately. Discussed findings and plan with patient/guardian, who agrees with care plan. All questions answered. Return precautions discussed and outpatient follow up given.       Monico Blitz, PA-C 12/24/14 1236  Merrily Pew, MD 12/24/14 1547

## 2014-12-24 NOTE — Discharge Instructions (Signed)
For pain control please take ibuprofen (also known as Motrin or Advil) 800mg  (this is normally 4 over the counter pills) 3 times a day  for 5 days. Take with food to minimize stomach irritation.  Do not hesitate to return to the emergency room for any new, worsening or concerning symptoms.  Please obtain primary care using resource guide below. Let them know that you were seen in the emergency room and that they will need to obtain records for further outpatient management.    Upper Respiratory Infection, Adult Most upper respiratory infections (URIs) are caused by a virus. A URI affects the nose, throat, and upper air passages. The most common type of URI is often called "the common cold." HOME CARE   Take medicines only as told by your doctor.  Gargle warm saltwater or take cough drops to comfort your throat as told by your doctor.  Use a warm mist humidifier or inhale steam from a shower to increase air moisture. This may make it easier to breathe.  Drink enough fluid to keep your pee (urine) clear or pale yellow.  Eat soups and other clear broths.  Have a healthy diet.  Rest as needed.  Go back to work when your fever is gone or your doctor says it is okay.  You may need to stay home longer to avoid giving your URI to others.  You can also wear a face mask and wash your hands often to prevent spread of the virus.  Use your inhaler more if you have asthma.  Do not use any tobacco products, including cigarettes, chewing tobacco, or electronic cigarettes. If you need help quitting, ask your doctor. GET HELP IF:  You are getting worse, not better.  Your symptoms are not helped by medicine.  You have chills.  You are getting more short of breath.  You have brown or red mucus.  You have yellow or brown discharge from your nose.  You have pain in your face, especially when you bend forward.  You have a fever.  You have puffy (swollen) neck glands.  You have pain  while swallowing.  You have white areas in the back of your throat. GET HELP RIGHT AWAY IF:   You have very bad or constant:  Headache.  Ear pain.  Pain in your forehead, behind your eyes, and over your cheekbones (sinus pain).  Chest pain.  You have long-lasting (chronic) lung disease and any of the following:  Wheezing.  Long-lasting cough.  Coughing up blood.  A change in your usual mucus.  You have a stiff neck.  You have changes in your:  Vision.  Hearing.  Thinking.  Mood. MAKE SURE YOU:   Understand these instructions.  Will watch your condition.  Will get help right away if you are not doing well or get worse.   This information is not intended to replace advice given to you by your health care provider. Make sure you discuss any questions you have with your health care provider.   Document Released: 07/12/2007 Document Revised: 06/09/2014 Document Reviewed: 04/30/2013 Elsevier Interactive Patient Education 2016 [employer].  Emergency Department Resource Guide 1) Find a Doctor and Pay Out of Pocket Although you won't have to find out who is covered by your insurance plan, it is a good idea to ask around and get recommendations. You will then need to call the office and see if the doctor you have chosen will accept you as a new patient and what  types of options they offer for patients who are self-pay. Some doctors offer discounts or will set up payment plans for their patients who do not have insurance, but you will need to ask so you aren't surprised when you get to your appointment.  2) Contact Your Local Health Department Not all health departments have doctors that can see patients for sick visits, but many do, so it is worth a call to see if yours does. If you don't know where your local health department is, you can check in your phone book. The CDC also has a tool to help you locate your state's health department, and many state websites also  have listings of all of their local health departments.  3) Find a [facility] If your illness is not likely to be very severe or complicated, you may want to try a walk in clinic. These are popping up all over the country in pharmacies, drugstores, and shopping centers. They're usually staffed by nurse practitioners or physician assistants that have been trained to treat common illnesses and complaints. They're usually fairly quick and inexpensive. However, if you have serious medical issues or chronic medical problems, these are probably not your best option.  No Primary Care Doctor: - Call Health Connect at  [phone] - they can help you locate a primary care doctor that  accepts your insurance, provides certain services, etc. - Physician Referral Service- [phone]  Chronic Pain Problems: Organization         Address  Phone   Notes  [facility]  [phone] Patients need to be referred by their primary care doctor.   Medication Assistance: Organization         Address  Phone   Notes  [facility] Medication [facility] [address] [place], [place] [address] [phone] --Must be a resident of [facility] -- Must have NO insurance coverage whatsoever (no Medicaid/ Medicare, etc.) -- The pt. MUST have a primary care doctor that directs their care regularly and follows them in the community   MedAssist  [phone]   [employer]  [phone]    Agencies that provide inexpensive medical care: Organization         Address  Phone   Notes  [place]  [phone]   [facility]    [phone]   [facility] [place], [place] [address] [phone]   [place] [address] [place] [phone]   Planned Parenthood    [phone]   [facility]    [phone]   [place] and [place], [place] Phone:  [phone], Fax:  [phone] Hours of Operation:  9 am - 6 pm, M-F.  Also accepts Medicaid/Medicare and self-pay.  [facility] for [address], [address], [place] Phone: [phone], Fax: [phone]. Hours of Operation:  8:30 am - 5:30 pm, M-F.  Also accepts Medicaid and self-pay.  [facility] [place] [address], [place] Phone: [phone]   [place], [place], [place] [phone], Ext. [phone] Mondays & Thursdays: 7-9 AM.  First 15 patients are seen on a first come, first serve basis.    [place] Providers:  Patent attorney  Notes  [facility] [address], [place] [phone] Also accepts self-pay patients.  [facility] [id number] [place], [place]  [phone]   [place], [address], [place] [phone]   [facility] [address] [place] [phone]   Lucianne Lei [address], [place]   [phone] Only accepts [place] Access [place] patients after they have their name applied to their card.   Self-Pay (no insurance) in [facility]:  Organization         Address  Phone   Notes  Sickle Cell Patients, [facility] [place] [phone]   [facility] [place] [phone]   [facility] [place]  [place], [place], [place] [phone]   [facility]/Dr. Osei-Bonsu  [address], [place] or [address], [place] [phone] Phone number for both [place] and [place] locations is the same.  Urgent Medical and [facility] [place] [address], [place] [phone]   [facility] [address]  [place] or [address] [phone]   [facility] [address], [place] [phone], phone; [phone], fax Sees patients 1st and 3rd Saturday of every month.  Must not qualify for public or private insurance (i.e. Medicaid, Medicare, [place] Health Choice, Veterans' Benefits)  Household income should be no more than 200% of the poverty level The clinic cannot treat you if you are pregnant or think you are pregnant  Sexually transmitted diseases are not treated at the clinic.    Dental Care: Organization         Address  Phone  Notes  [facility] Department of [facility] [place] [phone] Accepts children up to age 58 who are enrolled in [place] or [place]; pregnant women with a Medicaid card; and children who have applied for Medicaid or [place] Health Choice, but were declined, whose parents can pay a reduced fee at time of service.  [facility] Department of [facility]  [address], [place] [phone] Accepts children up to age 43 who are enrolled in [place] or [place]; pregnant women with a Medicaid card; and children who have applied for Medicaid or [place] Health Choice, but were declined, whose parents can pay a reduced fee at time of service.  [place] Adult Dental Access PROGRAM  [place] [phone] Patients are seen by appointment only. Walk-ins are not accepted. [place] will see patients 34 years of age and older. Monday - Tuesday (8am-5pm) Most Wednesdays (8:30-5pm) $30 per visit, cash only  [facility] Adult Dental Access PROGRAM  [address], [facility] [phone] Patients are seen by appointment only. Walk-ins are not accepted. [place] will see patients 86 years of age and older. One Wednesday Evening (Monthly: Volunteer Based).  $30 per visit, cash only  [place]  [phone] for adults; Children under age 84, call Graduate Pediatric Dentistry at [phone]. Children aged 39-14, please call [phone] to request a pediatric application.  Dental services are provided in all areas of dental care including fillings, crowns and bridges, complete  and partial dentures, implants, gum treatment, root canals, and extractions. Preventive care is also provided. Treatment is provided to both adults and children. Patients are selected via a lottery and there is often a waiting list.   [facility] [address], [place]  [phone] [email or web address]   [facility] [address] [place], [place] [phone], Ext. [phone] Second and Fourth Thursday of each month, opens at 6:30 AM; Clinic ends at 9 AM.  Patients are seen on a first-come first-served basis, and a limited number are seen during each clinic.   [facility]  [address] [place], [place] [phone]   Eligibility Requirements You must have lived in [place], [place], or [place] counties for at least the last three months.   You cannot be eligible for state or federal sponsored [employer], including [employer], [place], or [employer].   You generally cannot be eligible for healthcare insurance through your employer.    How to apply: Eligibility screenings are held every Tuesday and Wednesday afternoon from 1:00 pm until 4:00 pm. You do not need an appointment for the interview!  [facility] [address] [place], [place]   [place]  [place] Department  [place]  [phone]    Behavioral Health Resources in the Community: Intensive Outpatient Programs Organization         Address  Phone  Notes  [place] Nelson. [address], [place], [place] [phone]     [facility] Outpatient [address], [place], [place]   ADS: Alcohol & Drug Svcs [address], [place], [place]   [place] [address]  [place], [place] or [phone]   Substance Abuse Resources Organization         Address  Phone  Notes  Alcohol and Drug Services  [phone]   [place]  [phone]   [facility] [place]   Chinita Pester  [phone]   Residential & Outpatient Substance Abuse Program  [phone]   Psychological Services Organization         Address  Phone  Notes  [facility] [place]  [place]  [phone]   [place] [address], [place] or [phone]    Mobile Crisis Teams Organization         Address  Phone  Notes  [facility], Mobile Crisis Care Unit  [phone]   [facility] Psych[facility]  [address] [place], [place]   [place] [address], [place] [phone]    Self-Help/Support Groups Organization         Address  Phone             Notes  [place]. of [place] - variety of support groups  [place] Call for more information  Narcotics Anonymous (NA), [facility] [address], [employer] [place]  2 meetings at this location   Special educational needs teacher         Address  Phone  Notes  [facility] Residential Treatment [place],    [place]  [facility]  [address] [place], [place], [place]   [place], [place] [phone] Admissions: 8am-3pm  M-F  [facility] [address],    [place], [place] [phone]   [facility] [address] [place], [place], [place]   [facility] [address]  [place],  [place]   [facility] - Intensive Outpatient [address], [place], [place]   [facility] [address].,  [place], [place] [phone] or [phone]   [facility]idential Treatment Services (RTS) [address] [place], [place] Accepts Medicaid  [facility] [place] [address],  [place] [phone] Substance Abuse/Addiction Treatment   [facility] Organization         Address  Phone  Notes  [facility]  [phone]   Domenic Schwab, PhD [address] [place], [place]   [phone] or [phone]   [place], [place] [phone]   [facility] [address] [place], [place] [phone] Insurance/Medicaid/sponsorship through [facility] and Families [address], [address] [place]                                    [place], [place] [phone] [place] [address], [place] [phone]    Dr. Adele Schilder  [phone]   [facility] Dept. 1) [address] [place] 2) [place] 3)  [place] [address], [place] [phone]  [phone]   [place] [phone] or [phone] (After Hours)

## 2014-12-24 NOTE — ED Notes (Signed)
Per pt, states diagnosed with URI on the 14th-given an inhaler-states not getting better-sore throat and now having lower back pain-no dysuria

## 2014-12-24 NOTE — ED Notes (Signed)
AVS explained in detail. Knows to follow up with PCP if needed and to increase rest and fluid intake. Work note given. RR even/unlabored. No other c/c.

## 2014-12-26 LAB — CULTURE, GROUP A STREP: STREP A CULTURE: NEGATIVE

## 2015-07-26 DIAGNOSIS — H6692 Otitis media, unspecified, left ear: Secondary | ICD-10-CM | POA: Diagnosis not present

## 2015-07-26 DIAGNOSIS — R0981 Nasal congestion: Secondary | ICD-10-CM | POA: Diagnosis not present

## 2015-07-29 ENCOUNTER — Encounter (HOSPITAL_COMMUNITY): Payer: Self-pay | Admitting: Emergency Medicine

## 2015-07-29 ENCOUNTER — Ambulatory Visit (HOSPITAL_COMMUNITY)
Admission: EM | Admit: 2015-07-29 | Discharge: 2015-07-29 | Disposition: A | Discharge status: Home / Self Care | Payer: 59 | Attending: Family Medicine | Admitting: Family Medicine

## 2015-07-29 DIAGNOSIS — H6983 Other specified disorders of Eustachian tube, bilateral: Secondary | ICD-10-CM | POA: Diagnosis not present

## 2015-07-29 MED ORDER — PREDNISONE 50 MG PO TABS: ORAL_TABLET | ORAL | Status: DC

## 2015-07-29 MED ORDER — IPRATROPIUM BROMIDE 0.06 % NA SOLN: 2.0000 | Freq: Four times a day (QID) | NASAL | Status: DC

## 2015-07-29 NOTE — ED Provider Notes (Signed)
CSN: [id number]     Arrival date & time 07/29/15  1550 History   First MD Initiated Contact with Patient 07/29/15 1603     Chief Complaint  Patient presents with  . Otalgia  . Hearing Loss   (Consider location/radiation/quality/duration/timing/severity/associated sxs/prior Treatment) Patient is a 30 y.o. female presenting with ear pain. The history is provided by the patient.  Otalgia Location:  Bilateral Behind ear:  No abnormality Quality:  Pressure Severity:  Mild Onset quality:  Gradual Duration:  2 days Chronicity:  New Context comment:  Inability to hear properly , trying to valsalva. Associated symptoms: congestion   Associated symptoms: no ear discharge and no hearing loss     Past Medical History  Diagnosis Date  . UTI (urinary tract infection)    History reviewed. No pertinent past surgical history. No family history on file. Social History  Substance Use Topics  . Smoking status: Current Some Day Smoker    Types: Cigarettes  . Smokeless tobacco: Never Used  . Alcohol Use: Yes   OB History    No data available     Review of Systems  HENT: Positive for congestion, ear pain and postnasal drip. Negative for ear discharge and hearing loss.   Respiratory: Negative.   All other systems reviewed and are negative.   Allergies  Review of patient's allergies indicates no known allergies.  Home Medications   Prior to Admission medications   Medication Sig Start Date End Date Taking? Authorizing Provider  acetaminophen (TYLENOL) 325 MG tablet Take 650 mg by mouth every 6 (six) hours as needed.   Yes Historical Provider, MD  AMOXICILLIN PO Take by mouth.   Yes Historical Provider, MD  azithromycin (ZITHROMAX) 250 MG tablet Take 1 tablet (250 mg total) by mouth daily. Take first 2 tablets together, then 1 every day until finished. 03/11/13   Maude Leriche, PA-C  ipratropium (ATROVENT) 0.06 % nasal spray Place 2 sprays into both nostrils 4 (four) times daily. 07/29/15    Billy Fischer, MD  Phenylephrine-DM-GG-APAP (TYLENOL COLD/FLU SEVERE) 5-10-200-325 MG TABS Take 1 tablet by mouth 2 (two) times daily as needed (flu-like symptoms).    Historical Provider, MD  predniSONE (DELTASONE) 50 MG tablet 1 tab daily for 2 days then 1/2 tab daily for 2 days. 07/29/15   Billy Fischer, MD   Meds Ordered and Administered this Visit  Medications - No data to display  BP 120/84 mmHg  Pulse 89  Temp(Src) 98.5 F (36.9 C) (Oral)  Resp 14  SpO2 98%  LMP 07/09/2015 No data found.   Physical Exam  Constitutional: She appears well-developed and well-nourished. No distress.  HENT:  Head: Normocephalic.  Right Ear: External ear normal. Tympanic membrane is injected and retracted. Tympanic membrane mobility is abnormal. No middle ear effusion.  Left Ear: External ear normal. Tympanic membrane is injected and retracted. Tympanic membrane mobility is abnormal.  No middle ear effusion.  Nose: Nose normal.  Mouth/Throat: Oropharynx is clear and moist.  Nursing note and vitals reviewed.   ED Course  Procedures (including critical care time)  Labs Review Labs Reviewed - No data to display  Imaging Review No results found.   Visual Acuity Review  Right Eye Distance:   Left Eye Distance:   Bilateral Distance:    Right Eye Near:   Left Eye Near:    Bilateral Near:         MDM   1. ETD (eustachian tube dysfunction), bilateral  Billy Fischer, MD 07/29/15 [phone]

## 2015-07-29 NOTE — ED Notes (Signed)
Patient reports being seen for ear infection, started on amoxicillin, and no better, left ear hurts worse.  Decreased ability to hear

## 2015-09-21 ENCOUNTER — Encounter (HOSPITAL_COMMUNITY): Payer: Self-pay | Admitting: *Deleted

## 2015-09-21 ENCOUNTER — Ambulatory Visit (HOSPITAL_COMMUNITY)
Admission: EM | Admit: 2015-09-21 | Discharge: 2015-09-21 | Disposition: A | Discharge status: Home / Self Care | Payer: 59 | Attending: Family Medicine | Admitting: Family Medicine

## 2015-09-21 DIAGNOSIS — R197 Diarrhea, unspecified: Secondary | ICD-10-CM

## 2015-09-21 DIAGNOSIS — R112 Nausea with vomiting, unspecified: Secondary | ICD-10-CM

## 2015-09-21 MED ORDER — ONDANSETRON 4 MG PO TBDP
4.0000 mg | ORAL_TABLET | Freq: Once | ORAL | Status: AC
  Administered 2015-09-21: 4 mg via ORAL

## 2015-09-21 MED ORDER — DIPHENOXYLATE-ATROPINE 2.5-0.025 MG PO TABS: 2.0000 | ORAL_TABLET | Freq: Four times a day (QID) | ORAL | 0 refills | Status: DC | PRN

## 2015-09-21 MED ORDER — ONDANSETRON 4 MG PO TBDP
ORAL_TABLET | ORAL | Status: AC
  Filled 2015-09-21: qty 1

## 2015-09-21 NOTE — ED Triage Notes (Signed)
Pt  Reports   Symptoms  Of  Nausea   Vomiting  Yesterday  As  Well  As      Diarrhea  That  Has  Been       Going  On  For  sev  Days

## 2015-09-21 NOTE — ED Provider Notes (Signed)
[facility]    CSN: [id number] Arrival date & time: 09/21/15  1340  First Provider Contact:  First MD Initiated Contact with Patient 09/21/15 1445        History   Chief Complaint Chief Complaint  Patient presents with  . Diarrhea    HPI Krystal Short is a 30 y.o. female.   The history is provided by the patient.  Diarrhea  Quality:  Watery Severity:  Mild Duration:  4 days Progression:  Unchanged Ineffective treatments:  Anti-motility medications Associated symptoms: abdominal pain and vomiting   Associated symptoms: no fever     Past Medical History:  Diagnosis Date  . UTI (urinary tract infection)     There are no active problems to display for this patient.   History reviewed. No pertinent surgical history.  OB History    No data available       Home Medications    Prior to Admission medications   Medication Sig Start Date End Date Taking? Authorizing Provider  acetaminophen (TYLENOL) 325 MG tablet Take 650 mg by mouth every 6 (six) hours as needed.    Historical Provider, MD  AMOXICILLIN PO Take by mouth.    Historical Provider, MD  azithromycin (ZITHROMAX) 250 MG tablet Take 1 tablet (250 mg total) by mouth daily. Take first 2 tablets together, then 1 every day until finished. 03/11/13   Maude Leriche, PA-C  diphenoxylate-atropine (LOMOTIL) 2.5-0.025 MG tablet Take 2 tablets by mouth 4 (four) times daily as needed for diarrhea or loose stools. 09/21/15   Billy Fischer, MD  ipratropium (ATROVENT) 0.06 % nasal spray Place 2 sprays into both nostrils 4 (four) times daily. 07/29/15   Billy Fischer, MD  Phenylephrine-DM-GG-APAP (TYLENOL COLD/FLU SEVERE) 5-10-200-325 MG TABS Take 1 tablet by mouth 2 (two) times daily as needed (flu-like symptoms).    Historical Provider, MD  predniSONE (DELTASONE) 50 MG tablet 1 tab daily for 2 days then 1/2 tab daily for 2 days. 07/29/15   Billy Fischer, MD    Family History History reviewed. No pertinent family  history.  Social History Social History  Substance Use Topics  . Smoking status: Current Some Day Smoker    Types: Cigarettes  . Smokeless tobacco: Never Used  . Alcohol use Yes     Allergies   Review of patient's allergies indicates no known allergies.   Review of Systems Review of Systems  Constitutional: Negative for fever.  Respiratory: Negative.   Cardiovascular: Negative.   Gastrointestinal: Positive for abdominal pain, diarrhea, nausea and vomiting. Negative for blood in stool.  Genitourinary: Negative.   All other systems reviewed and are negative.    Physical Exam Triage Vital Signs ED Triage Vitals [09/21/15 1425]  Enc Vitals Group     BP 116/81     Pulse Rate 117     Resp 12     Temp 98.5 F (36.9 C)     Temp Source Oral     SpO2 100 %     Weight      Height      Head Circumference      Peak Flow      Pain Score      Pain Loc      Pain Edu?      Excl. in [place]?    No data found.   Updated Vital Signs BP 116/81 (BP Location: Left Arm)   Pulse 117   Temp 98.5 F (36.9 C) (Oral)  Resp 12   LMP 08/31/2015   SpO2 100%   Visual Acuity Right Eye Distance:   Left Eye Distance:   Bilateral Distance:    Right Eye Near:   Left Eye Near:    Bilateral Near:     Physical Exam  Constitutional: She is oriented to person, place, and time. She appears well-developed and well-nourished. No distress.  HENT:  Right Ear: External ear normal.  Left Ear: External ear normal.  Mouth/Throat: Oropharynx is clear and moist.  Neck: Normal range of motion. Neck supple.  Cardiovascular: Normal rate, regular rhythm and normal heart sounds.   Pulmonary/Chest: Effort normal and breath sounds normal.  Abdominal: Soft. Bowel sounds are normal. She exhibits no mass. There is no tenderness. There is no rebound and no guarding. No hernia.  Lymphadenopathy:    She has no cervical adenopathy.  Neurological: She is alert and oriented to person, place, and time.  Skin:  Skin is warm and dry.  Nursing note and vitals reviewed.    UC Treatments / Results  Labs (all labs ordered are listed, but only abnormal results are displayed) Labs Reviewed - No data to display  EKG  EKG Interpretation None       Radiology No results found.  Procedures Procedures (including critical care time)  Medications Ordered in UC Medications  ondansetron (ZOFRAN-ODT) disintegrating tablet 4 mg (4 mg Oral Given 09/21/15 1458)     Initial Impression / Assessment and Plan / UC Course  I have reviewed the triage vital signs and the nursing notes.  Pertinent labs & imaging results that were available during my care of the patient were reviewed by me and considered in my medical decision making (see chart for details).  Clinical Course      Final Clinical Impressions(s) / UC Diagnoses   Final diagnoses:  Nausea vomiting and diarrhea    New Prescriptions New Prescriptions   DIPHENOXYLATE-ATROPINE (LOMOTIL) 2.5-0.025 MG TABLET    Take 2 tablets by mouth 4 (four) times daily as needed for diarrhea or loose stools.     Billy Fischer, MD 09/21/15 1500

## 2015-12-10 ENCOUNTER — Ambulatory Visit (HOSPITAL_COMMUNITY)
Admission: EM | Admit: 2015-12-10 | Discharge: 2015-12-10 | Disposition: A | Discharge status: Home / Self Care | Payer: 59 | Attending: Family Medicine | Admitting: Family Medicine

## 2015-12-10 ENCOUNTER — Encounter (HOSPITAL_COMMUNITY): Payer: Self-pay | Admitting: Emergency Medicine

## 2015-12-10 DIAGNOSIS — L509 Urticaria, unspecified: Secondary | ICD-10-CM

## 2015-12-10 MED ORDER — PREDNISONE 20 MG PO TABS: ORAL_TABLET | ORAL | 0 refills | Status: DC

## 2015-12-10 NOTE — ED Provider Notes (Signed)
[place]    CSN: [id number] Arrival date & time: 12/10/15  1529     History   Chief Complaint Chief Complaint  Patient presents with  . Rash    HPI Krystal Short is a 30 y.o. female.   Is a 30 year old woman who presents at the urgent care center at [facility] for evaluation of a rash which began this morning.   She's been eating quite a bit of Chinese shrimp this last week. Other than the Chinese shrimp, patient's had no change in her clothing, exposures to chemicals, different soap, etc.  Patient works in the hospital.  She lives with her boyfriend who does not have a rash.      Past Medical History:  Diagnosis Date  . UTI (urinary tract infection)     There are no active problems to display for this patient.   History reviewed. No pertinent surgical history.  OB History    No data available       Home Medications    Prior to Admission medications   Medication Sig Start Date End Date Taking? Authorizing Provider  acetaminophen (TYLENOL) 325 MG tablet Take 650 mg by mouth every 6 (six) hours as needed.   Yes Historical Provider, MD  ipratropium (ATROVENT) 0.06 % nasal spray Place 2 sprays into both nostrils 4 (four) times daily. 07/29/15   Billy Fischer, MD  predniSONE (DELTASONE) 20 MG tablet Two daily with food 12/10/15   Robyn Haber, MD    Family History History reviewed. No pertinent family history.  Social History Social History  Substance Use Topics  . Smoking status: Current Some Day Smoker    Types: Cigarettes  . Smokeless tobacco: Never Used  . Alcohol use Yes     Allergies   Review of patient's allergies indicates no known allergies.   Review of Systems Review of Systems  Constitutional: Negative.   HENT: Negative.   Eyes: Negative.   Respiratory: Negative.   Cardiovascular: Negative.   Gastrointestinal: Negative.   Musculoskeletal: Negative.   Skin: Positive for rash.     Physical Exam Triage Vital  Signs ED Triage Vitals [12/10/15 1616]  Enc Vitals Group     BP 134/89     Pulse Rate 77     Resp 18     Temp 98.1 F (36.7 C)     Temp Source Oral     SpO2 100 %     Weight      Height      Head Circumference      Peak Flow      Pain Score      Pain Loc      Pain Edu?      Excl. in [place]?    No data found.   Updated Vital Signs BP 134/89 (BP Location: Left Arm)   Pulse 77   Temp 98.1 F (36.7 C) (Oral)   Resp 18   LMP 11/19/2015   SpO2 100%       Physical Exam  Constitutional: She is oriented to person, place, and time. She appears well-developed and well-nourished.  HENT:  Head: Normocephalic and atraumatic.  Right Ear: External ear normal.  Left Ear: External ear normal.  Mouth/Throat: Oropharynx is clear and moist.  Eyes: Conjunctivae and EOM are normal. Pupils are equal, round, and reactive to light.  Neck: Normal range of motion. Neck supple.  Cardiovascular: Normal rate, regular rhythm and normal heart sounds.   Pulmonary/Chest: Effort normal and  breath sounds normal.  Musculoskeletal: Normal range of motion.  Lymphadenopathy:    She has no cervical adenopathy.  Neurological: She is alert and oriented to person, place, and time.  Skin: Skin is warm and dry.  Diffuse urticarial rash, entire body  Nursing note and vitals reviewed.    UC Treatments / Results  Labs (all labs ordered are listed, but only abnormal results are displayed) Labs Reviewed - No data to display  EKG  EKG Interpretation None       Radiology No results found.  Procedures Procedures (including critical care time)  Medications Ordered in UC Medications - No data to display   Initial Impression / Assessment and Plan / UC Course  I have reviewed the triage vital signs and the nursing notes.  Pertinent labs & imaging results that were available during my care of the patient were reviewed by me and considered in my medical decision making (see chart for  details).  Clinical Course    Final Clinical Impressions(s) / UC Diagnoses   Final diagnoses:  Urticaria    New Prescriptions New Prescriptions   PREDNISONE (DELTASONE) 20 MG TABLET    Two daily with food     Robyn Haber, MD 12/10/15 1631

## 2015-12-10 NOTE — ED Triage Notes (Signed)
Here for rash all over body onset this am when she woke up  Denies new: foods, hygiene products, meds, fevers  Reports new puppy onset 1 month  A&O x4... NAD

## 2016-03-08 DIAGNOSIS — F4320 Adjustment disorder, unspecified: Secondary | ICD-10-CM | POA: Diagnosis not present

## 2016-03-21 DIAGNOSIS — F4320 Adjustment disorder, unspecified: Secondary | ICD-10-CM | POA: Diagnosis not present

## 2016-03-28 DIAGNOSIS — F4320 Adjustment disorder, unspecified: Secondary | ICD-10-CM | POA: Diagnosis not present

## 2016-04-06 ENCOUNTER — Encounter (HOSPITAL_COMMUNITY): Payer: Self-pay | Admitting: *Deleted

## 2016-04-06 ENCOUNTER — Ambulatory Visit (HOSPITAL_COMMUNITY)
Admission: EM | Admit: 2016-04-06 | Discharge: 2016-04-06 | Disposition: A | Discharge status: Home / Self Care | Payer: 59 | Attending: Internal Medicine | Admitting: Internal Medicine

## 2016-04-06 DIAGNOSIS — J111 Influenza due to unidentified influenza virus with other respiratory manifestations: Secondary | ICD-10-CM | POA: Diagnosis not present

## 2016-04-06 MED ORDER — OSELTAMIVIR PHOSPHATE 75 MG PO CAPS: 75.0000 mg | ORAL_CAPSULE | Freq: Two times a day (BID) | ORAL | 0 refills | Status: DC

## 2016-04-06 MED ORDER — TRAMADOL HCL 50 MG PO TABS: 50.0000 mg | ORAL_TABLET | Freq: Four times a day (QID) | ORAL | 0 refills | Status: DC | PRN

## 2016-04-06 NOTE — Discharge Instructions (Signed)
Tamiflu as directed. Drink plenty fluids and stay well-hydrated. Ibuprofen for aches and pains and fever. Tramadol for achiness and pain. plenty of rest.

## 2016-04-06 NOTE — ED Triage Notes (Signed)
Pt  Reports    Symptoms  Of  Cough     sortethroat   Chills  And   Body  Aches   With onset of  Symptoms   Since  Yesterday

## 2016-04-06 NOTE — ED Provider Notes (Signed)
CSN: [id number]     Arrival date & time 04/06/16  1525 History   First MD Initiated Contact with Patient 04/06/16 1552     Chief Complaint  Patient presents with  . URI   (Consider location/radiation/quality/duration/timing/severity/associated sxs/prior Treatment) 31 year old female states she had sudden onset of flulike symptoms this morning. She cough, sore throat, chills, body aches, headache, fatigue and malaise and fever. She has taken some DayQuil with modest relief. She had a flu shot this year. Denies respiratory or GI symptoms. Current temperature 99.2.      Past Medical History:  Diagnosis Date  . UTI (urinary tract infection)    History reviewed. No pertinent surgical history. History reviewed. No pertinent family history. Social History  Substance Use Topics  . Smoking status: Current Some Day Smoker    Types: Cigarettes  . Smokeless tobacco: Never Used  . Alcohol use Yes   OB History    No data available     Review of Systems  Constitutional: Positive for activity change, fatigue and fever. Negative for appetite change and chills.  HENT: Positive for sore throat. Negative for congestion, ear discharge, facial swelling, postnasal drip and rhinorrhea.   Eyes: Negative.   Respiratory: Negative.   Cardiovascular: Negative.   Gastrointestinal: Negative.   Musculoskeletal: Positive for myalgias. Negative for neck pain and neck stiffness.  Skin: Negative for pallor and rash.  Neurological: Positive for headaches.  All other systems reviewed and are negative.   Allergies  Patient has no known allergies.  Home Medications   Prior to Admission medications   Medication Sig Start Date End Date Taking? Authorizing Provider  oseltamivir (TAMIFLU) 75 MG capsule Take 1 capsule (75 mg total) by mouth 2 (two) times daily. X 5 days 04/06/16   Janne Napoleon, NP  traMADol (ULTRAM) 50 MG tablet Take 1 tablet (50 mg total) by mouth every 6 (six) hours as needed. 04/06/16   Janne Napoleon, NP   Meds Ordered and Administered this Visit  Medications - No data to display  BP 124/76   Pulse 80   Temp 99.2 F (37.3 C) (Oral)   Resp 18   LMP 03/31/2016   SpO2 100%  No data found.   Physical Exam  Constitutional: She is oriented to person, place, and time. She appears well-developed. No distress.  HENT:  Right Ear: External ear normal.  Left Ear: External ear normal.  Nose: Nose normal.  Mouth/Throat: Oropharynx is clear and moist.  Eyes: EOM are normal. Pupils are equal, round, and reactive to light.  Neck: Normal range of motion. Neck supple.  Cardiovascular: Normal rate, regular rhythm, normal heart sounds and intact distal pulses.   Pulmonary/Chest: Effort normal and breath sounds normal. No respiratory distress. She has no wheezes. She has no rales.  Musculoskeletal: Normal range of motion. She exhibits no edema.  Lymphadenopathy:    She has no cervical adenopathy.  Neurological: She is alert and oriented to person, place, and time.  Skin: Skin is warm and dry.  Psychiatric: She has a normal mood and affect.  Nursing note and vitals reviewed.   Urgent Care Course     Procedures (including critical care time)  Labs Review Labs Reviewed - No data to display  Imaging Review No results found.   Visual Acuity Review  Right Eye Distance:   Left Eye Distance:   Bilateral Distance:    Right Eye Near:   Left Eye Near:    Bilateral Near:  MDM   1. Influenza    Tamiflu as directed. Drink plenty fluids and stay well-hydrated. Ibuprofen for aches and pains and fever. Tramadol for achiness and pain. plenty of rest. Meds ordered this encounter  Medications  . oseltamivir (TAMIFLU) 75 MG capsule    Sig: Take 1 capsule (75 mg total) by mouth 2 (two) times daily. X 5 days    Dispense:  10 capsule    Refill:  0    Order Specific Question:   Supervising Provider    Answer:   Sherlene Shams [[id number]]  . traMADol (ULTRAM) 50 MG tablet     Sig: Take 1 tablet (50 mg total) by mouth every 6 (six) hours as needed.    Dispense:  15 tablet    Refill:  0    Order Specific Question:   Supervising Provider    Answer:   Sherlene Shams [[id number]]       Janne Napoleon, NP 04/06/16 [id number]

## 2016-04-13 ENCOUNTER — Encounter (HOSPITAL_COMMUNITY): Payer: Self-pay | Admitting: Emergency Medicine

## 2016-04-13 ENCOUNTER — Ambulatory Visit (HOSPITAL_COMMUNITY)
Admission: EM | Admit: 2016-04-13 | Discharge: 2016-04-13 | Disposition: A | Discharge status: Home / Self Care | Payer: 59 | Attending: Internal Medicine | Admitting: Internal Medicine

## 2016-04-13 ENCOUNTER — Ambulatory Visit (INDEPENDENT_AMBULATORY_CARE_PROVIDER_SITE_OTHER): Payer: 59

## 2016-04-13 DIAGNOSIS — J181 Lobar pneumonia, unspecified organism: Secondary | ICD-10-CM

## 2016-04-13 DIAGNOSIS — J189 Pneumonia, unspecified organism: Secondary | ICD-10-CM

## 2016-04-13 DIAGNOSIS — R05 Cough: Secondary | ICD-10-CM | POA: Diagnosis not present

## 2016-04-13 MED ORDER — AZITHROMYCIN 250 MG PO TABS: 250.0000 mg | ORAL_TABLET | Freq: Every day | ORAL | 0 refills | Status: DC

## 2016-04-13 MED ORDER — CEFTRIAXONE SODIUM 1 G IJ SOLR
INTRAMUSCULAR | Status: AC
  Filled 2016-04-13: qty 10

## 2016-04-13 MED ORDER — CEFTRIAXONE SODIUM 1 G IJ SOLR
1.0000 g | Freq: Once | INTRAMUSCULAR | Status: AC
  Administered 2016-04-13: 1 g via INTRAMUSCULAR

## 2016-04-13 MED ORDER — ALBUTEROL SULFATE HFA 108 (90 BASE) MCG/ACT IN AERS: 1.0000 | INHALATION_SPRAY | Freq: Four times a day (QID) | RESPIRATORY_TRACT | 0 refills | Status: DC | PRN

## 2016-04-13 MED ORDER — BENZONATATE 100 MG PO CAPS: 100.0000 mg | ORAL_CAPSULE | Freq: Three times a day (TID) | ORAL | 0 refills | Status: DC

## 2016-04-13 NOTE — Discharge Instructions (Signed)

## 2016-04-13 NOTE — ED Triage Notes (Signed)
Pt c/o cold sx onset: 1 week  Sx include: prod cough, LH, BA, ST, HA, intermittent fevers, SOB  Seen on 03/01 for similar sx... Given tamiflu   Taking: OTC cold meds w/temp relief.   A&O x4... NAD

## 2016-04-13 NOTE — ED Provider Notes (Signed)
CSN: [id number]     Arrival date & time 04/13/16  1417 History   First MD Initiated Contact with Patient 04/13/16 1444     Chief Complaint  Patient presents with  . URI   (Consider location/radiation/quality/duration/timing/severity/associated sxs/prior Treatment) HPI  Krystal Short is a 31 y.o. female presenting to UC with c/o 1 week of worsening URI symptoms.  Pt was seen at this facility 1 week ago, dx with influenza and started on Tamiflu.  She has been taking as prescribed but only has mild temporary relief with OTC mediations.  She reports 2 episodes of vomiting earlier today. Mild intermittent SOB. No hx of asthma. She did get the flu vaccine this year.    Past Medical History:  Diagnosis Date  . UTI (urinary tract infection)    History reviewed. No pertinent surgical history. History reviewed. No pertinent family history. Social History  Substance Use Topics  . Smoking status: Current Some Day Smoker    Types: Cigarettes  . Smokeless tobacco: Never Used  . Alcohol use Yes   OB History    No data available     Review of Systems  Constitutional: Positive for appetite change, chills, fatigue and fever.  HENT: Positive for congestion and rhinorrhea. Negative for ear pain, sore throat, trouble swallowing and voice change.   Respiratory: Positive for cough, chest tightness and shortness of breath.   Cardiovascular: Negative for chest pain and palpitations.  Gastrointestinal: Positive for nausea and vomiting. Negative for abdominal pain and diarrhea.  Musculoskeletal: Negative for arthralgias, back pain and myalgias.  Skin: Negative for rash.    Allergies  Patient has no known allergies.  Home Medications   Prior to Admission medications   Medication Sig Start Date End Date Taking? Authorizing Provider  oseltamivir (TAMIFLU) 75 MG capsule Take 1 capsule (75 mg total) by mouth 2 (two) times daily. X 5 days 04/06/16  Yes Janne Napoleon, NP  traMADol (ULTRAM) 50 MG tablet Take 1  tablet (50 mg total) by mouth every 6 (six) hours as needed. 04/06/16  Yes Janne Napoleon, NP  albuterol (PROVENTIL HFA;VENTOLIN HFA) 108 (90 Base) MCG/ACT inhaler Inhale 1-2 puffs into the lungs every 6 (six) hours as needed for wheezing or shortness of breath. 04/13/16   Noland Fordyce, PA-C  azithromycin (ZITHROMAX) 250 MG tablet Take 1 tablet (250 mg total) by mouth daily. Take first 2 tablets together, then 1 every day until finished. 04/13/16   Noland Fordyce, PA-C  benzonatate (TESSALON) 100 MG capsule Take 1 capsule (100 mg total) by mouth every 8 (eight) hours. 04/13/16   Noland Fordyce, PA-C   Meds Ordered and Administered this Visit   Medications  cefTRIAXone (ROCEPHIN) injection 1 g (not administered)    BP 122/88 (BP Location: Left Arm)   Pulse 107   Temp 98.2 F (36.8 C) (Oral)   Resp 16   LMP 04/02/2016   SpO2 95%  No data found.   Physical Exam  Constitutional: She is oriented to person, place, and time. She appears well-developed and well-nourished. No distress.  HENT:  Head: Normocephalic and atraumatic.  Right Ear: Tympanic membrane normal.  Left Ear: Tympanic membrane normal.  Nose: Nose normal.  Mouth/Throat: Uvula is midline, oropharynx is clear and moist and mucous membranes are normal.  Eyes: EOM are normal.  Neck: Normal range of motion. Neck supple.  Cardiovascular: Normal rate and regular rhythm.   Pulmonary/Chest: Effort normal. No stridor. No respiratory distress. She has no wheezes. She has rhonchi (  diffuse, clears with cough). She has no rales.  Musculoskeletal: Normal range of motion.  Lymphadenopathy:    She has no cervical adenopathy.  Neurological: She is alert and oriented to person, place, and time.  Skin: Skin is warm and dry. She is not diaphoretic.  Psychiatric: She has a normal mood and affect. Her behavior is normal.  Nursing note and vitals reviewed.   Urgent Care Course     Procedures (including critical care time)  Labs Review Labs  Reviewed - No data to display  Imaging Review Dg Chest 2 View  Result Date: 04/13/2016 CLINICAL DATA:  31 year old presenting with 1 week history of cough, fever, sore throat, headache and myalgias. Symptoms are worsening despite the fact that she is currently taking Tamiflu. EXAM: CHEST  2 VIEW COMPARISON:  12/24/2014, 03/11/2013. FINDINGS: Cardiomediastinal silhouette unremarkable, unchanged. Streaky and patchy airspace opacities in the anteromedial right lower lobe. Lungs otherwise clear. No pleural effusions. Pulmonary vascularity normal. Visualized bony thorax intact. IMPRESSION: Bronchopneumonia involving the right lower lobe. Electronically Signed   By: Evangeline Dakin M.D.   On: 04/13/2016 15:03     MDM   1. Pneumonia of right lower lobe due to infectious organism ([place])    Hx and exam c/w Right lower lobe pneumonia likely secondary to influenza.  Tx in UC: 1g Rocephin IM  Rx: Tessalon, Azithromycin, and albuterol inhaler.  F/u with PCP in 1 week if not improving.     Noland Fordyce, PA-C 04/13/16 1530

## 2016-08-13 ENCOUNTER — Encounter (HOSPITAL_COMMUNITY): Payer: Self-pay | Admitting: *Deleted

## 2016-08-13 ENCOUNTER — Ambulatory Visit (HOSPITAL_COMMUNITY)
Admission: EM | Admit: 2016-08-13 | Discharge: 2016-08-13 | Disposition: A | Discharge status: Home / Self Care | Payer: 59 | Attending: Family Medicine | Admitting: Family Medicine

## 2016-08-13 DIAGNOSIS — H1013 Acute atopic conjunctivitis, bilateral: Secondary | ICD-10-CM

## 2016-08-13 MED ORDER — OLOPATADINE HCL 0.7 % OP SOLN: 1.0000 [drp] | Freq: Every day | OPHTHALMIC | 0 refills | Status: DC

## 2016-08-13 NOTE — ED Triage Notes (Signed)
Eye  Irritation   With  Redness  In  Drainage      X  3  Days        Both  Eyes

## 2016-08-13 NOTE — ED Provider Notes (Signed)
[place]    CSN: [id number] Arrival date & time: 08/13/16  1339     History   Chief Complaint Chief Complaint  Patient presents with  . Eye Problem    HPI Krystal Short is a 31 y.o. female.   HPI  Patient presents with a two-day history of itchy, burning, and red eyes. She woke up this morning and her eyes were sealed shut with crusting. Her eyes have been burning with subsequent watering. She has not tried anything at home. No sick contacts. She's not having any headaches, nausea, or vomiting.  Past Medical History:  Diagnosis Date  . UTI (urinary tract infection)    History reviewed. No pertinent surgical history.   Home Medications    Prior to Admission medications   Medication Sig Start Date End Date Taking? Authorizing Provider  albuterol (PROVENTIL HFA;VENTOLIN HFA) 108 (90 Base) MCG/ACT inhaler Inhale 1-2 puffs into the lungs every 6 (six) hours as needed for wheezing or shortness of breath. 04/13/16   Noe Gens, PA-C  Olopatadine HCl (PAZEO) 0.7 % SOLN Apply 1 drop to eye daily. 08/13/16   Shelda Pal, DO    Family History History reviewed. No pertinent family history.  Social History Social History  Substance Use Topics  . Smoking status: Current Some Day Smoker    Types: Cigarettes  . Smokeless tobacco: Never Used  . Alcohol use Yes     Allergies   Patient has no known allergies.   Review of Systems Review of Systems  Eyes: Positive for redness.     Physical Exam Triage Vital Signs ED Triage Vitals  Enc Vitals Group     BP 08/13/16 1432 100/70     Pulse Rate 08/13/16 1432 80     Resp 08/13/16 1432 18     Temp 08/13/16 1432 98.2 F (36.8 C)     Temp Source 08/13/16 1432 Oral     SpO2 08/13/16 1432 97 %   Updated Vital Signs BP 100/70 (BP Location: Right Arm)   Pulse 80   Temp 98.2 F (36.8 C) (Oral)   Resp 18   LMP 07/29/2016   SpO2 97%   Physical Exam  Constitutional: She appears well-developed and  well-nourished. No distress.  HENT:  Head: Normocephalic and atraumatic.  Eyes:  Sclera injected b/l, no drainage, sclera unaffected, PERRLA, EOMi  Pulmonary/Chest: No respiratory distress.  Skin: Skin is warm and dry. She is not diaphoretic.  Psychiatric: She has a normal mood and affect. Judgment normal.     UC Treatments / Results  Procedures Procedures none  Initial Impression / Assessment and Plan / UC Course  I have reviewed the triage vital signs and the nursing notes.  Pertinent labs & imaging results that were available during my care of the patient were reviewed by me and considered in my medical decision making (see chart for details).     Patient presents with bilateral erythematous sclera, no significant discharge. Could be viral, will treat as a allergic. Artificial tears also recommended. Practice good hand and do not share towels. Follow-up with PCP if symptoms fail to improve. The patient is to be discharged in stable condition. The patient voiced understanding and agreement to the plan.  Final Clinical Impressions(s) / UC Diagnoses   Final diagnoses:  Acute atopic conjunctivitis of both eyes    New Prescriptions Discharge Medication List as of 08/13/2016  2:56 PM    START taking these medications   Details  Olopatadine  HCl (PAZEO) 0.7 % SOLN Apply 1 drop to eye daily., Starting Sun 08/13/2016, Normal         Shelda Pal, DO 08/13/16 1502

## 2016-08-13 NOTE — Discharge Instructions (Signed)
Use warm compresses to help with drainage/crustiness.  Use artificial tears (Refresh and Systane are name brand options) as needed to help with the burning sensation. You can use these as much as you wish, but generally people find that every 3-4 hours is helpful.   Try not to touch anything or share towels. Wash your hands frequently.

## 2017-05-11 ENCOUNTER — Ambulatory Visit (HOSPITAL_COMMUNITY)
Admission: EM | Admit: 2017-05-11 | Discharge: 2017-05-11 | Disposition: A | Discharge status: Home / Self Care | Payer: No Typology Code available for payment source | Attending: Internal Medicine | Admitting: Internal Medicine

## 2017-05-11 ENCOUNTER — Encounter (HOSPITAL_COMMUNITY): Payer: Self-pay | Admitting: Family Medicine

## 2017-05-11 DIAGNOSIS — R05 Cough: Secondary | ICD-10-CM | POA: Diagnosis not present

## 2017-05-11 DIAGNOSIS — R0789 Other chest pain: Secondary | ICD-10-CM

## 2017-05-11 DIAGNOSIS — R5381 Other malaise: Secondary | ICD-10-CM | POA: Insufficient documentation

## 2017-05-11 DIAGNOSIS — J029 Acute pharyngitis, unspecified: Secondary | ICD-10-CM

## 2017-05-11 DIAGNOSIS — Z79899 Other long term (current) drug therapy: Secondary | ICD-10-CM | POA: Diagnosis not present

## 2017-05-11 DIAGNOSIS — R0989 Other specified symptoms and signs involving the circulatory and respiratory systems: Secondary | ICD-10-CM | POA: Diagnosis not present

## 2017-05-11 DIAGNOSIS — R69 Illness, unspecified: Secondary | ICD-10-CM | POA: Diagnosis not present

## 2017-05-11 DIAGNOSIS — R0602 Shortness of breath: Secondary | ICD-10-CM | POA: Diagnosis not present

## 2017-05-11 DIAGNOSIS — F17210 Nicotine dependence, cigarettes, uncomplicated: Secondary | ICD-10-CM | POA: Diagnosis not present

## 2017-05-11 DIAGNOSIS — J111 Influenza due to unidentified influenza virus with other respiratory manifestations: Secondary | ICD-10-CM | POA: Diagnosis not present

## 2017-05-11 LAB — POCT RAPID STREP A: STREPTOCOCCUS, GROUP A SCREEN (DIRECT): NEGATIVE

## 2017-05-11 MED ORDER — ONDANSETRON HCL 4 MG PO TABS: 8.0000 mg | ORAL_TABLET | ORAL | 0 refills | Status: DC | PRN

## 2017-05-11 MED ORDER — BENZONATATE 200 MG PO CAPS: 200.0000 mg | ORAL_CAPSULE | Freq: Three times a day (TID) | ORAL | 0 refills | Status: DC | PRN

## 2017-05-11 MED ORDER — OSELTAMIVIR PHOSPHATE 75 MG PO CAPS: 75.0000 mg | ORAL_CAPSULE | Freq: Two times a day (BID) | ORAL | 0 refills | Status: DC

## 2017-05-11 NOTE — ED Triage Notes (Addendum)
Pt here for chest tightness, cough, heart burn and SOB. Reports this has been for the past week. Chest pain with breathing and belching.

## 2017-05-11 NOTE — ED Provider Notes (Signed)
[place]    CSN: [id number] Arrival date & time: 05/11/17  1527     History   Chief Complaint Chief Complaint  Patient presents with  . Shortness of Breath  . Cough    HPI Krystal Short is a 32 y.o. female.   She presents today with 2-3-day history of chest tightness, feels short of breath, increased coughing and sinus congestion.  Runny nose.  Some sore throat when she coughs.  Not vomiting, no diarrhea.  Does have achiness especially in her mid back.  Malaise.    HPI  Past Medical History:  Diagnosis Date  . UTI (urinary tract infection)     History reviewed. No pertinent surgical history.   Home Medications    Prior to Admission medications   Medication Sig Start Date End Date Taking? Authorizing Provider  albuterol (PROVENTIL HFA;VENTOLIN HFA) 108 (90 Base) MCG/ACT inhaler Inhale 1-2 puffs into the lungs every 6 (six) hours as needed for wheezing or shortness of breath. 04/13/16   Noe Gens, PA-C  benzonatate (TESSALON) 200 MG capsule Take 1 capsule (200 mg total) by mouth 3 (three) times daily as needed for cough. 05/11/17   Wynona Luna, MD  Olopatadine HCl (PAZEO) 0.7 % SOLN Apply 1 drop to eye daily. 08/13/16   Shelda Pal, DO  ondansetron (ZOFRAN) 4 MG tablet Take 2 tablets (8 mg total) by mouth every 4 (four) hours as needed for nausea or vomiting. 05/11/17   Wynona Luna, MD  oseltamivir (TAMIFLU) 75 MG capsule Take 1 capsule (75 mg total) by mouth every 12 (twelve) hours. 05/11/17   Wynona Luna, MD    Family History History reviewed. No pertinent family history.  Social History Social History   Tobacco Use  . Smoking status: Current Some Day Smoker    Types: Cigarettes  . Smokeless tobacco: Never Used  Substance Use Topics  . Alcohol use: Yes  . Drug use: No     Allergies   Patient has no known allergies.   Review of Systems Review of Systems  All other systems reviewed and are  negative.    Physical Exam Triage Vital Signs ED Triage Vitals  Enc Vitals Group     BP 05/11/17 1601 126/77     Pulse Rate 05/11/17 1601 96     Resp 05/11/17 1601 (!) 22     Temp 05/11/17 1601 97.7 F (36.5 C)     Temp src --      SpO2 05/11/17 1601 100 %     Weight --      Height --      Pain Score 05/11/17 1559 7     Pain Loc --    Updated Vital Signs BP 126/77   Pulse 96   Temp 97.7 F (36.5 C)   Resp (!) 22   LMP 05/02/2017   SpO2 100%  Physical Exam  Constitutional: She is oriented to person, place, and time.  Sounds congested, looks ill but not toxic Little bit of mouth breathing Frequent coughing  HENT:  Head: Atraumatic.  Bilateral TMs are dull, no erythema Moderate nasal congestion bilaterally Throat is injected  Eyes:  Conjugate gaze observed, no eye redness/discharge  Neck: Neck supple.  Cardiovascular: Normal rate and regular rhythm.  Pulmonary/Chest: No respiratory distress. She has no wheezes. She has no rales.  Coarse but symmetric breath sounds throughout  Abdominal: She exhibits no distension.  Musculoskeletal: Normal range of motion.  Neurological: She is  alert and oriented to person, place, and time.  Skin: Skin is warm and dry.  Nursing note and vitals reviewed.    UC Treatments / Results  Labs Results for orders placed or performed during the hospital encounter of 05/11/17  Culture, group A strep  Result Value Ref Range   Specimen Description THROAT    Special Requests NONE    Culture      CULTURE REINCUBATED FOR BETTER GROWTH Performed at [facility], [place] [address], [place],  [address]    Report Status PENDING   POCT rapid strep A [facility])  Result Value Ref Range   Streptococcus, Group A Screen (Direct) NEGATIVE NEGATIVE    EKG None Radiology No results found.  Procedures Procedures (including critical care time) None today  Final Clinical Impressions(s) / UC Diagnoses   Final diagnoses:   Influenza-like illness   Push fluids and rest. No danger signs on exam.  Ibuprofen or aleve or tylenol otc as needed for fever, achiness.  Strep swab was negative; a throat culture is pending .  The urgent care will contact you if more treatment is needed.  Prescriptions for tamiflu (for possible flu), benzonatate (for cough), and zofran (for nausea) were sent to the pharmacy.  Anticipate gradual improvement in cough, chest tightness/breathlessness over the next several days.  Cough may take a couple weeks to subside.  Recheck for persistent (>3-4 more days) fever >100.5, increasing phlegm production/nasal discharge, or if not starting to improve in a few days.   Note for work today.    ED Discharge Orders        Ordered    oseltamivir (TAMIFLU) 75 MG capsule  Every 12 hours     05/11/17 1650    ondansetron (ZOFRAN) 4 MG tablet  Every 4 hours PRN     05/11/17 1650    benzonatate (TESSALON) 200 MG capsule  3 times daily PRN     05/11/17 1652         Wynona Luna, MD 05/13/17 2019

## 2017-05-11 NOTE — Discharge Instructions (Addendum)
Push fluids and rest. No danger signs on exam.  Ibuprofen or aleve or tylenol otc as needed for fever, achiness.  Strep swab was negative; a throat culture is pending .  The urgent care will contact you if more treatment is needed.  Prescriptions for tamiflu (for possible flu), benzonatate (for cough), and zofran (for nausea) were sent to the pharmacy.  Anticipate gradual improvement in cough, chest tightness/breathlessness over the next several days.  Cough may take a couple weeks to subside.  Recheck for persistent (>3-4 more days) fever >100.5, increasing phlegm production/nasal discharge, or if not starting to improve in a few days.   Note for work today.

## 2017-05-14 ENCOUNTER — Telehealth (HOSPITAL_COMMUNITY): Payer: Self-pay

## 2017-05-14 LAB — CULTURE, GROUP A STREP (THRC)

## 2017-05-14 NOTE — Telephone Encounter (Signed)
Attempted to reach patient regarding results from recent visit. No answer at this time, voicemail left. Need to educate pt on results being within normal limits.

## 2017-06-03 ENCOUNTER — Emergency Department (HOSPITAL_COMMUNITY): Payer: No Typology Code available for payment source

## 2017-06-03 ENCOUNTER — Inpatient Hospital Stay (HOSPITAL_COMMUNITY)
Admission: EM | Admit: 2017-06-03 | Discharge: 2017-06-11 | DRG: 974 | Disposition: A | Discharge status: Home / Self Care | Payer: No Typology Code available for payment source | Attending: Internal Medicine | Admitting: Internal Medicine

## 2017-06-03 ENCOUNTER — Other Ambulatory Visit: Payer: Self-pay

## 2017-06-03 ENCOUNTER — Encounter (HOSPITAL_COMMUNITY): Payer: Self-pay | Admitting: Emergency Medicine

## 2017-06-03 DIAGNOSIS — R609 Edema, unspecified: Secondary | ICD-10-CM | POA: Diagnosis present

## 2017-06-03 DIAGNOSIS — R091 Pleurisy: Secondary | ICD-10-CM | POA: Diagnosis present

## 2017-06-03 DIAGNOSIS — D638 Anemia in other chronic diseases classified elsewhere: Secondary | ICD-10-CM | POA: Diagnosis present

## 2017-06-03 DIAGNOSIS — R6510 Systemic inflammatory response syndrome (SIRS) of non-infectious origin without acute organ dysfunction: Secondary | ICD-10-CM | POA: Diagnosis not present

## 2017-06-03 DIAGNOSIS — E860 Dehydration: Secondary | ICD-10-CM | POA: Diagnosis present

## 2017-06-03 DIAGNOSIS — K5900 Constipation, unspecified: Secondary | ICD-10-CM | POA: Diagnosis present

## 2017-06-03 DIAGNOSIS — J189 Pneumonia, unspecified organism: Secondary | ICD-10-CM | POA: Diagnosis not present

## 2017-06-03 DIAGNOSIS — L309 Dermatitis, unspecified: Secondary | ICD-10-CM

## 2017-06-03 DIAGNOSIS — E871 Hypo-osmolality and hyponatremia: Secondary | ICD-10-CM

## 2017-06-03 DIAGNOSIS — Z716 Tobacco abuse counseling: Secondary | ICD-10-CM

## 2017-06-03 DIAGNOSIS — B3781 Candidal esophagitis: Secondary | ICD-10-CM | POA: Diagnosis present

## 2017-06-03 DIAGNOSIS — F17210 Nicotine dependence, cigarettes, uncomplicated: Secondary | ICD-10-CM | POA: Diagnosis present

## 2017-06-03 DIAGNOSIS — K2970 Gastritis, unspecified, without bleeding: Secondary | ICD-10-CM | POA: Diagnosis present

## 2017-06-03 DIAGNOSIS — Z87440 Personal history of urinary (tract) infections: Secondary | ICD-10-CM

## 2017-06-03 DIAGNOSIS — B20 Human immunodeficiency virus [HIV] disease: Secondary | ICD-10-CM | POA: Diagnosis not present

## 2017-06-03 DIAGNOSIS — E876 Hypokalemia: Secondary | ICD-10-CM | POA: Diagnosis present

## 2017-06-03 DIAGNOSIS — Z720 Tobacco use: Secondary | ICD-10-CM | POA: Diagnosis present

## 2017-06-03 DIAGNOSIS — J9601 Acute respiratory failure with hypoxia: Secondary | ICD-10-CM | POA: Diagnosis present

## 2017-06-03 DIAGNOSIS — B370 Candidal stomatitis: Secondary | ICD-10-CM | POA: Diagnosis present

## 2017-06-03 DIAGNOSIS — B9729 Other coronavirus as the cause of diseases classified elsewhere: Secondary | ICD-10-CM | POA: Diagnosis present

## 2017-06-03 DIAGNOSIS — Z8701 Personal history of pneumonia (recurrent): Secondary | ICD-10-CM

## 2017-06-03 DIAGNOSIS — F17290 Nicotine dependence, other tobacco product, uncomplicated: Secondary | ICD-10-CM | POA: Diagnosis present

## 2017-06-03 DIAGNOSIS — B59 Pneumocystosis: Secondary | ICD-10-CM | POA: Diagnosis present

## 2017-06-03 DIAGNOSIS — Z79899 Other long term (current) drug therapy: Secondary | ICD-10-CM

## 2017-06-03 DIAGNOSIS — R000 Tachycardia, unspecified: Secondary | ICD-10-CM | POA: Diagnosis present

## 2017-06-03 DIAGNOSIS — J181 Lobar pneumonia, unspecified organism: Secondary | ICD-10-CM

## 2017-06-03 HISTORY — DX: Pneumonia, unspecified organism: J18.9

## 2017-06-03 HISTORY — DX: Dermatitis, unspecified: L30.9

## 2017-06-03 HISTORY — DX: Pleurisy: R09.1

## 2017-06-03 HISTORY — DX: Hypo-osmolality and hyponatremia: E87.1

## 2017-06-03 LAB — COMPREHENSIVE METABOLIC PANEL
ALK PHOS: 69 U/L (ref 38–126)
ALT: 12 U/L — ABNORMAL LOW (ref 14–54)
ANION GAP: 11 (ref 5–15)
AST: 30 U/L (ref 15–41)
Albumin: 2.6 g/dL — ABNORMAL LOW (ref 3.5–5.0)
BILIRUBIN TOTAL: 0.6 mg/dL (ref 0.3–1.2)
BUN: 6 mg/dL (ref 6–20)
CALCIUM: 8.2 mg/dL — AB (ref 8.9–10.3)
CO2: 23 mmol/L (ref 22–32)
Chloride: 98 mmol/L — ABNORMAL LOW (ref 101–111)
Creatinine, Ser: 0.66 mg/dL (ref 0.44–1.00)
GFR calc Af Amer: >60 mL/min (ref >60)
GFR calc non Af Amer: >60 mL/min (ref >60)
GLUCOSE: 101 mg/dL — AB (ref 65–99)
Potassium: 3.7 mmol/L (ref 3.5–5.1)
SODIUM: 132 mmol/L — AB (ref 135–145)
TOTAL PROTEIN: 8.4 g/dL — AB (ref 6.5–8.1)

## 2017-06-03 LAB — I-STAT BETA HCG BLOOD, ED (MC, WL, AP ONLY): I-stat hCG, quantitative: <5 m[IU]/mL (ref <5)

## 2017-06-03 LAB — CBC WITH DIFFERENTIAL/PLATELET
BASOS ABS: 0 10*3/uL (ref 0.0–0.1)
BASOS PCT: 0 %
EOS PCT: 1 %
Eosinophils Absolute: 0 10*3/uL (ref 0.0–0.7)
HEMATOCRIT: 34.4 % — AB (ref 36.0–46.0)
Hemoglobin: 11.7 g/dL — ABNORMAL LOW (ref 12.0–15.0)
LYMPHS PCT: 24 %
Lymphs Abs: 1.9 10*3/uL (ref 0.7–4.0)
MCH: 32.1 pg (ref 26.0–34.0)
MCHC: 34 g/dL (ref 30.0–36.0)
MCV: 94.5 fL (ref 78.0–100.0)
MONOS PCT: 6 %
Monocytes Absolute: 0.5 10*3/uL (ref 0.1–1.0)
NEUTROS ABS: 5.4 10*3/uL (ref 1.7–7.7)
Neutrophils Relative %: 69 %
PLATELETS: 350 10*3/uL (ref 150–400)
RBC: 3.64 MIL/uL — ABNORMAL LOW (ref 3.87–5.11)
RDW: 12.8 % (ref 11.5–15.5)
WBC: 7.8 10*3/uL (ref 4.0–10.5)

## 2017-06-03 LAB — I-STAT CG4 LACTIC ACID, ED
LACTIC ACID, VENOUS: 1.23 mmol/L (ref 0.5–1.9)
LACTIC ACID, VENOUS: 1.47 mmol/L (ref 0.5–1.9)

## 2017-06-03 MED ORDER — SODIUM CHLORIDE 0.9% FLUSH
3.0000 mL | Freq: Two times a day (BID) | INTRAVENOUS | Status: DC
  Administered 2017-06-03 – 2017-06-10 (×12): 3 mL via INTRAVENOUS

## 2017-06-03 MED ORDER — CEFTRIAXONE SODIUM 1 G IJ SOLR
1.0000 g | Freq: Once | INTRAMUSCULAR | Status: AC
  Administered 2017-06-03: 1 g via INTRAVENOUS
  Filled 2017-06-03: qty 10

## 2017-06-03 MED ORDER — ONDANSETRON HCL 4 MG PO TABS: 4.0000 mg | ORAL_TABLET | Freq: Four times a day (QID) | ORAL | Status: DC | PRN

## 2017-06-03 MED ORDER — PANTOPRAZOLE SODIUM 40 MG PO TBEC
40.0000 mg | DELAYED_RELEASE_TABLET | Freq: Every day | ORAL | Status: DC
  Administered 2017-06-04 – 2017-06-11 (×8): 40 mg via ORAL
  Filled 2017-06-03 (×8): qty 1

## 2017-06-03 MED ORDER — SODIUM CHLORIDE 0.9 % IV SOLN
INTRAVENOUS | Status: DC
  Administered 2017-06-03 – 2017-06-04 (×2): via INTRAVENOUS

## 2017-06-03 MED ORDER — ACETAMINOPHEN 325 MG PO TABS
650.0000 mg | ORAL_TABLET | Freq: Four times a day (QID) | ORAL | Status: DC | PRN
  Administered 2017-06-06 – 2017-06-10 (×4): 650 mg via ORAL
  Filled 2017-06-03 (×5): qty 2

## 2017-06-03 MED ORDER — IPRATROPIUM-ALBUTEROL 0.5-2.5 (3) MG/3ML IN SOLN
3.0000 mL | Freq: Four times a day (QID) | RESPIRATORY_TRACT | Status: DC
  Administered 2017-06-03 – 2017-06-05 (×5): 3 mL via RESPIRATORY_TRACT
  Filled 2017-06-03 (×6): qty 3

## 2017-06-03 MED ORDER — METHYLPREDNISOLONE SODIUM SUCC 40 MG IJ SOLR
40.0000 mg | Freq: Two times a day (BID) | INTRAMUSCULAR | Status: DC
  Administered 2017-06-03 – 2017-06-06 (×6): 40 mg via INTRAVENOUS
  Filled 2017-06-03 (×6): qty 1

## 2017-06-03 MED ORDER — SODIUM CHLORIDE 0.9 % IV BOLUS
1000.0000 mL | Freq: Once | INTRAVENOUS | Status: AC
  Administered 2017-06-03: 1000 mL via INTRAVENOUS

## 2017-06-03 MED ORDER — ACETAMINOPHEN 500 MG PO TABS
1000.0000 mg | ORAL_TABLET | Freq: Once | ORAL | Status: AC
  Administered 2017-06-03: 1000 mg via ORAL
  Filled 2017-06-03: qty 2

## 2017-06-03 MED ORDER — ONDANSETRON HCL 4 MG/2ML IJ SOLN: 4.0000 mg | Freq: Four times a day (QID) | INTRAMUSCULAR | Status: DC | PRN

## 2017-06-03 MED ORDER — FAMOTIDINE IN NACL 20-0.9 MG/50ML-% IV SOLN
20.0000 mg | Freq: Two times a day (BID) | INTRAVENOUS | Status: DC
  Administered 2017-06-04 – 2017-06-05 (×3): 20 mg via INTRAVENOUS
  Filled 2017-06-03 (×3): qty 50

## 2017-06-03 MED ORDER — KETOROLAC TROMETHAMINE 15 MG/ML IJ SOLN
15.0000 mg | Freq: Four times a day (QID) | INTRAMUSCULAR | Status: DC
  Administered 2017-06-03 – 2017-06-04 (×2): 15 mg via INTRAVENOUS
  Filled 2017-06-03 (×2): qty 1

## 2017-06-03 MED ORDER — SODIUM CHLORIDE 0.9 % IV SOLN
500.0000 mg | Freq: Once | INTRAVENOUS | Status: AC
  Administered 2017-06-03: 500 mg via INTRAVENOUS
  Filled 2017-06-03: qty 500

## 2017-06-03 MED ORDER — ENOXAPARIN SODIUM 40 MG/0.4ML ~~LOC~~ SOLN
40.0000 mg | SUBCUTANEOUS | Status: DC
  Administered 2017-06-04 – 2017-06-10 (×7): 40 mg via SUBCUTANEOUS
  Filled 2017-06-03 (×9): qty 0.4

## 2017-06-03 MED ORDER — SODIUM CHLORIDE 0.9 % IV SOLN
500.0000 mg | INTRAVENOUS | Status: DC
  Administered 2017-06-04: 500 mg via INTRAVENOUS
  Filled 2017-06-03 (×2): qty 500

## 2017-06-03 MED ORDER — ALBUTEROL SULFATE (2.5 MG/3ML) 0.083% IN NEBU: 2.5000 mg | INHALATION_SOLUTION | RESPIRATORY_TRACT | Status: DC | PRN

## 2017-06-03 MED ORDER — GUAIFENESIN 100 MG/5ML PO SOLN
5.0000 mL | ORAL | Status: DC | PRN
  Administered 2017-06-06: 100 mg via ORAL
  Filled 2017-06-03 (×2): qty 5

## 2017-06-03 MED ORDER — ACETAMINOPHEN 650 MG RE SUPP: 650.0000 mg | Freq: Four times a day (QID) | RECTAL | Status: DC | PRN

## 2017-06-03 MED ORDER — SODIUM CHLORIDE 0.9 % IV SOLN
1.0000 g | INTRAVENOUS | Status: DC
  Administered 2017-06-04: 1 g via INTRAVENOUS
  Filled 2017-06-03 (×2): qty 10

## 2017-06-03 NOTE — ED Notes (Signed)
Admitting provider at bedside.

## 2017-06-03 NOTE — H&P (Signed)
History and Physical    Krystal Short YKD:[id number] DOB: [birth year] DOA: 06/03/2017  **Will admit patient based on the expectation that the patient will need hospitalization/ hospital care that crosses at least 2 midnights  PCP: Patient, No Pcp Per   Attending physician: [place]  Patient coming from/Resides with: Private residence/husband  Chief Complaint: Cough, shortness of breath, chest discomfort  HPI: Krystal Short is a 32 y.o. female with medical history significant for tobacco abuse otherwise no chronic medical problems.  Patient was diagnosed with influenza on 4/5 and was treated with Tamiflu.  About 2 weeks later patient had not had any improvement in her symptoms return to urgent care and was diagnosed with right lower lobe pneumonia and treated with amoxicillin and steroids.  She has subsequently completed those antibiotics.  Again she has had no improvement in her symptoms and is reporting increased cough, intermittent chest discomfort.  She reports productive sputum that is anywhere from clear to yellow.  She reports subjective fevers.  No nausea vomiting and diarrhea but she has had a poor appetite.  She typically smokes 1 cigar a day but has not smoked in over a month.  In the ER repeat chest x-ray here concerning for either bilateral atelectasis versus evolving bilateral lower lobe pneumonia.  Patient did not have leukocytosis and was not hypoxemic.  She was observed with wet sounding paroxysmal coughing and persistent tachycardia and tachypnea.  She will be treated empirically for community-acquired pneumonia.  ED Course:  Vital Signs: BP 105/79   Pulse (!) 104   Temp 100 F (37.8 C) (Oral)   Resp (!) 30   Ht 5' 1.5" (1.562 m)   Wt 53.5 kg (118 lb)   LMP 05/30/2017   SpO2 96%   BMI 21.93 kg/m  CXR: As above Lab data: Sodium 132, potassium 3.7, chloride 98, CO2 23, glucose 101, BUN 6, creatinine 0.66, calcium 8.2, anion gap 11, albumin 2.6, LFTs not elevated,  lactic acid normal, white count 7800 with normal differential, hemoglobin 11.7, platelets 350,000, i-STAT hCG quantitative negative Medications and treatments: 1 bolus x2 L, Rocephin 1 g IV x1, Zithromax 500 mg IV x1  Review of Systems:  In addition to the HPI above,  No Headache, changes with Vision or hearing, new weakness, tingling, numbness in any extremity, dizziness, dysarthria or word finding difficulty, gait disturbance or imbalance, tremors or seizure activity No problems swallowing food or Liquids, indigestion/reflux, choking or coughing while eating, abdominal pain with or after eating No palpitations, orthopnea or DOE No Abdominal pain, N/V, melena,hematochezia, dark tarry stools, constipation No dysuria, malodorous urine, hematuria or flank pain No new skin rashes, masses or bruises, No new joint pains, aches, swelling or redness No recent unintentional weight gain or loss No polyuria, polydypsia or polyphagia   Past Medical History:  Diagnosis Date  . UTI (urinary tract infection)     No past surgical history on file.  Social History   Socioeconomic History  . Marital status: Single    Spouse name: Not on file  . Number of children: Not on file  . Years of education: Not on file  . Highest education level: Not on file  Occupational History  . Not on file  Social Needs  . Financial resource strain: Not on file  . Food insecurity:    Worry: Not on file    Inability: Not on file  . Transportation needs:    Medical: Not on file    Non-medical: Not on file  Tobacco Use  . Smoking status: Current Some Day Smoker    Types: Cigarettes  . Smokeless tobacco: Never Used  Substance and Sexual Activity  . Alcohol use: Yes  . Drug use: No  . Sexual activity: Yes    Birth control/protection: Condom  Lifestyle  . Physical activity:    Days per week: Not on file    Minutes per session: Not on file  . Stress: Not on file  Relationships  . Social connections:     Talks on phone: Not on file    Gets together: Not on file    Attends religious service: Not on file    Active member of club or organization: Not on file    Attends meetings of clubs or organizations: Not on file    Relationship status: Not on file  . Intimate partner violence:    Fear of current or ex partner: Not on file    Emotionally abused: Not on file    Physically abused: Not on file    Forced sexual activity: Not on file  Other Topics Concern  . Not on file  Social History Narrative  . Not on file    Mobility: Independent Work history: Fluid with food services at [employer]   No Known Allergies  Family history reviewed and not pertinent to current admission findings of diagnosis  Prior to Admission medications   Medication Sig Start Date End Date Taking? Authorizing Provider  albuterol (PROVENTIL HFA;VENTOLIN HFA) 108 (90 Base) MCG/ACT inhaler Inhale 1-2 puffs into the lungs every 6 (six) hours as needed for wheezing or shortness of breath. 04/13/16   Noe Gens, PA-C  benzonatate (TESSALON) 200 MG capsule Take 1 capsule (200 mg total) by mouth 3 (three) times daily as needed for cough. 05/11/17   Wynona Luna, MD  Olopatadine HCl (PAZEO) 0.7 % SOLN Apply 1 drop to eye daily. 08/13/16   Shelda Pal, DO  ondansetron (ZOFRAN) 4 MG tablet Take 2 tablets (8 mg total) by mouth every 4 (four) hours as needed for nausea or vomiting. 05/11/17   Wynona Luna, MD  oseltamivir (TAMIFLU) 75 MG capsule Take 1 capsule (75 mg total) by mouth every 12 (twelve) hours. 05/11/17   Wynona Luna, MD    Physical Exam: Vitals:   06/03/17 1535 06/03/17 1618 06/03/17 1700 06/03/17 1709  BP: 113/73 108/85 105/79   Pulse: (!) 123 (!) 117 (!) 104   Resp: 14 20 (!) 30   Temp: 98.2 F (36.8 C) 100 F (37.8 C)    TempSrc: Oral Oral    SpO2: 93% 97% 96%   Weight:    53.5 kg (118 lb)  Height:    5' 1.5" (1.562 m)      Constitutional: NAD, calm,  uncomfortable 2/2 going paroxysmal coughing and pleuritic chest discomfort Eyes: PERRL, lids and conjunctivae normal ENMT: Mucous membranes are dry. Posterior pharynx clear of any exudate or lesions. Normal dentition.  Neck: normal, supple, no masses, no thyromegaly Respiratory: To auscultation without wheezing, anteriorly with pleuritic rub area chest wall tender to palpation anteriorly.  Persistent tachypnea with increased work of breathing noted after paroxysmal coughing episodes.  Has wet sounding cough. RA Cardiovascular: Regular rhythm with resting tachycardia, no murmurs / rubs / gallops. No extremity edema. 2+ pedal pulses. No carotid bruits.  Hands and feet are cool to touch Abdomen: no tenderness, no masses palpated. No hepatosplenomegaly. Bowel sounds positive.  Musculoskeletal: no clubbing / cyanosis. No  joint deformity upper and lower extremities. Good ROM, no contractures. Normal muscle tone.  Skin: no lesions, ulcers. No induration, has diffuse facial rash with white lesions with punctate center-wart has been ongoing for greater than 1 year Neurologic: CN 2-12 grossly intact. Sensation intact, DTR normal. Strength 5/5 x all 4 extremities.  Psychiatric: Normal judgment and insight. Alert and oriented x 3. Normal mood.    Labs on Admission: I have personally reviewed following labs and imaging studies  CBC: Recent Labs  Lab 06/03/17 1413  WBC 7.8  NEUTROABS 5.4  HGB 11.7*  HCT 34.4*  MCV 94.5  PLT 270   Basic Metabolic Panel: Recent Labs  Lab 06/03/17 1413  NA 132*  K 3.7  CL 98*  CO2 23  GLUCOSE 101*  BUN 6  CREATININE 0.66  CALCIUM 8.2*   GFR: Estimated Creatinine Clearance: 79.5 mL/min (by C-G formula based on SCr of 0.66 mg/dL). Liver Function Tests: Recent Labs  Lab 06/03/17 1413  AST 30  ALT 12*  ALKPHOS 69  BILITOT 0.6  PROT 8.4*  ALBUMIN 2.6*   No results for input(s): LIPASE, AMYLASE in the last 168 hours. No results for input(s): AMMONIA in  the last 168 hours. Coagulation Profile: No results for input(s): INR, PROTIME in the last 168 hours. Cardiac Enzymes: No results for input(s): CKTOTAL, CKMB, CKMBINDEX, TROPONINI in the last 168 hours. BNP (last 3 results) No results for input(s): PROBNP in the last 8760 hours. HbA1C: No results for input(s): HGBA1C in the last 72 hours. CBG: No results for input(s): GLUCAP in the last 168 hours. Lipid Profile: No results for input(s): CHOL, HDL, LDLCALC, TRIG, CHOLHDL, LDLDIRECT in the last 72 hours. Thyroid Function Tests: No results for input(s): TSH, T4TOTAL, FREET4, T3FREE, THYROIDAB in the last 72 hours. Anemia Panel: No results for input(s): VITAMINB12, FOLATE, FERRITIN, TIBC, IRON, RETICCTPCT in the last 72 hours. Urine analysis:    Component Value Date/Time   COLORURINE YELLOW 12/03/2011 0550   APPEARANCEUR CLOUDY (A) 12/03/2011 0550   LABSPEC 1.021 12/03/2011 0550   PHURINE 6.0 12/03/2011 0550   GLUCOSEU NEGATIVE 12/03/2011 0550   HGBUR LARGE (A) 12/03/2011 0550   BILIRUBINUR NEGATIVE 12/03/2011 0550   KETONESUR NEGATIVE 12/03/2011 0550   PROTEINUR NEGATIVE 12/03/2011 0550   UROBILINOGEN 1.0 12/03/2011 0550   NITRITE NEGATIVE 12/03/2011 0550   LEUKOCYTESUR NEGATIVE 12/03/2011 0550   Sepsis Labs: @LABRCNTIP (procalcitonin:4,lacticidven:4) )No results found for this or any previous visit (from the past 240 hour(s)).   Radiological Exams on Admission: Dg Chest 2 View  Result Date: 06/03/2017 CLINICAL DATA:  Persistent cough and shortness of breath EXAM: CHEST - 2 VIEW COMPARISON:  04/13/2016 FINDINGS: Low volumes with patchy bilateral lung opacity distinct from medial right base opacity seen previously. Normal heart size. Normal mediastinal contours. No effusion or pneumothorax. No acute osseous finding. IMPRESSION: Low volumes with bilateral atelectasis or pneumonia. Electronically Signed   By: Monte Fantasia M.D.   On: 06/03/2017 15:08    EKG: (Independently  reviewed) sinus tachycardia with ventricular rate 117 bpm, QTC 435 ms, normal R wave rotation, no acute changes  Assessment/Plan Principal Problem:   CAP (community acquired pneumonia)/ Pleurisy -Patient presents with persistent URI symptoms after outpatient treatment for influenza as well as for right pneumonia (amoxicillin) with apparent failure of outpatient antibiotic treatment -Begin empiric Zithromax/Rocephin IV -Follow-up on blood cultures obtained in ER -Obtain sputum culture, urinary strep, ESR, procalcitonin, [place] viral panel -HIV per protocol -Oxygen available PRN -Scheduled IV Toradol for  pleurisy -IV Solu-Medrol 40 mg every 12 hours for pneumonia symptoms -Robitussin PRN for cough and as mucolytic -Albuterol neb every 4 hours PRN to assist with paroxysmal coughing -Protonix and Pepcid while on scheduled NSAID -If d-dimer negative patient will need CT chest without contrast to better clarify if pneumonia or other process  Active Problems:   Tachycardia -Likely related to ongoing chest wall pain, pleurisy and dehydration -Chest discomfort will obtain stat d-dimer-we will need CTA chest if positive    Acute hyponatremia -Likely secondary to dehydration -Continue normal saline at 150/HR  -Follow labs    Facial dermatitis  -Patient reports rash as described above ongoing for greater than 1 year -Continue to follow-May need dermatology follow-up as outpatient -Follow-up on HIV    Tobacco abuse -Admits to smoking 1 cigar daily up until 2 months ago -Tobacco cessation counseling    **Additional lab, imaging and/or diagnostic evaluation at discretion of supervising physician  DVT prophylaxis: Lovenox Code Status: Full Family Communication: Spouse Disposition Plan: Home Consults called: None    Krystal Short L. ANP-BC [facility] Pager [phone]   If 7PM-7AM, please contact night-coverage [email or web address] Password [facility]  06/03/2017, 5:41 PM

## 2017-06-03 NOTE — ED Provider Notes (Addendum)
[facility] EMERGENCY DEPARTMENT Provider Note   CSN: [id number] Arrival date & time: 06/03/17  1352     History   Chief Complaint Chief Complaint  Patient presents with  . Fever  . Cough  . Pneumonia    HPI Krystal Short is a 32 y.o. female presenting for evaluation of cough, shortness of breath, and fevers.  Pt states that her symptoms began 3 weeks ago.  She was evaluated at urgent care, diagnosed with possible flu and prescribed Tamiflu.  Symptoms did not improve.  She followed up with urgent care again, chest x-ray showed a possible pneumonia.  She was treated with amoxicillin, given a breathing treatment and an albuterol inhaler.  She improved for several days, until her symptoms worsened again.  On Wednesday, patient reports significant increase in shortness of breath and weakness.  She states she was unable to get up from her bed to her bathroom without becoming severely winded.  She reports continued productive cough.  She has left-sided chest pain with inspiration, coughing, and movement.  She has been using the inhaler without improvement in symptoms.  She reports subjective fevers.  She denies headache, nausea, vomiting, urinary symptoms, abnormal bowel movements.  She reports some cramping of the left upper quadrant, which she thinks is due to being hungry.  She denies leg pain or swelling.  She recently traveled to [place], was a 1 hour flight.  She has no risk factors for PE including recent surgeries, immobilization, trauma, travel, history of PE/DVT, hormone use, or history of cancer.  She does not smoke cigarettes, drinks about 3 drinks a week, denies other drug use including marijuana.  No history of asthma or COPD.  She has no other medical problems, does not take medications daily.  HPI  Past Medical History:  Diagnosis Date  . UTI (urinary tract infection)     There are no active problems to display for this patient.   No past surgical history  on file.   OB History   None      Home Medications    Prior to Admission medications   Medication Sig Start Date End Date Taking? Authorizing Provider  albuterol (PROVENTIL HFA;VENTOLIN HFA) 108 (90 Base) MCG/ACT inhaler Inhale 1-2 puffs into the lungs every 6 (six) hours as needed for wheezing or shortness of breath. 04/13/16   Noe Gens, PA-C  benzonatate (TESSALON) 200 MG capsule Take 1 capsule (200 mg total) by mouth 3 (three) times daily as needed for cough. 05/11/17   Wynona Luna, MD  Olopatadine HCl (PAZEO) 0.7 % SOLN Apply 1 drop to eye daily. 08/13/16   Shelda Pal, DO  ondansetron (ZOFRAN) 4 MG tablet Take 2 tablets (8 mg total) by mouth every 4 (four) hours as needed for nausea or vomiting. 05/11/17   Wynona Luna, MD  oseltamivir (TAMIFLU) 75 MG capsule Take 1 capsule (75 mg total) by mouth every 12 (twelve) hours. 05/11/17   Wynona Luna, MD    Family History No family history on file.  Social History Social History   Tobacco Use  . Smoking status: Current Some Day Smoker    Types: Cigarettes  . Smokeless tobacco: Never Used  Substance Use Topics  . Alcohol use: Yes  . Drug use: No     Allergies   Patient has no known allergies.   Review of Systems Review of Systems  Constitutional: Positive for fever.  Respiratory: Positive for cough and shortness of breath.  Cardiovascular: Positive for chest pain.  Gastrointestinal: Positive for abdominal pain.  All other systems reviewed and are negative.    Physical Exam Updated Vital Signs BP 108/85 (BP Location: Right Arm)   Pulse (!) 117   Temp 100 F (37.8 C) (Oral)   Resp 20   LMP 05/30/2017   SpO2 97%   Physical Exam  Constitutional: She is oriented to person, place, and time. She appears well-developed.  Patient appears acutely ill.  HENT:  Head: Normocephalic and atraumatic.  Mouth/Throat: Uvula is midline and oropharynx is clear and moist. Mucous membranes are  dry.  MM dry  Eyes: Pupils are equal, round, and reactive to light. Conjunctivae and EOM are normal.  Neck: Normal range of motion. Neck supple.  Cardiovascular: Regular rhythm and intact distal pulses.  tachycardic  Pulmonary/Chest: Breath sounds normal. Accessory muscle usage present. Tachypnea noted. No respiratory distress. She has no wheezes. She has no rhonchi. She has no rales.  Patient with increased work of breathing, speaking 3-4 words before taking a breath.  Tachypneic.  Not in respiratory distress.  Abdominal: Soft. She exhibits no distension and no mass. There is no tenderness. There is no guarding.  No TTP. Soft without rigidity, guarding, or distention  Musculoskeletal: Normal range of motion. She exhibits no edema.  No leg pain or swelling.  Radial and pedal pulses intact bilaterally.  Neurological: She is alert and oriented to person, place, and time.  Skin: Skin is warm and dry.  Vesicular rash noted on the face, has been present for > 1 yr.  Psychiatric: She has a normal mood and affect.  Nursing note and vitals reviewed.    ED Treatments / Results  Labs (all labs ordered are listed, but only abnormal results are displayed) Labs Reviewed  COMPREHENSIVE METABOLIC PANEL - Abnormal; Notable for the following components:      Result Value   Sodium 132 (*)    Chloride 98 (*)    Glucose, Bld 101 (*)    Calcium 8.2 (*)    Total Protein 8.4 (*)    Albumin 2.6 (*)    ALT 12 (*)    All other components within normal limits  CBC WITH DIFFERENTIAL/PLATELET - Abnormal; Notable for the following components:   RBC 3.64 (*)    Hemoglobin 11.7 (*)    HCT 34.4 (*)    All other components within normal limits  I-STAT CG4 LACTIC ACID, ED  I-STAT BETA HCG BLOOD, ED ([facility], [facility], [facility] ONLY)  I-STAT CG4 LACTIC ACID, ED    EKG None  Radiology Dg Chest 2 View  Result Date: 06/03/2017 CLINICAL DATA:  Persistent cough and shortness of breath EXAM: CHEST - 2 VIEW COMPARISON:   04/13/2016 FINDINGS: Low volumes with patchy bilateral lung opacity distinct from medial right base opacity seen previously. Normal heart size. Normal mediastinal contours. No effusion or pneumothorax. No acute osseous finding. IMPRESSION: Low volumes with bilateral atelectasis or pneumonia. Electronically Signed   By: Monte Fantasia M.D.   On: 06/03/2017 15:08    Procedures .Critical Care Performed by: Franchot Heidelberg, PA-C Authorized by: Franchot Heidelberg, PA-C   Critical care provider statement:    Critical care time (minutes):  35   Critical care time was exclusive of:  Teaching time and separately billable procedures and treating other patients   Critical care was necessary to treat or prevent imminent or life-threatening deterioration of the following conditions:  Sepsis   Critical care was time spent personally by me  on the following activities:  Blood draw for specimens, development of treatment plan with patient or surrogate, discussions with consultants, evaluation of patient's response to treatment, examination of patient, obtaining history from patient or surrogate, ordering and performing treatments and interventions, ordering and review of laboratory studies, ordering and review of radiographic studies, pulse oximetry, re-evaluation of patient's condition and review of old charts   I assumed direction of critical care for this patient from another provider in my specialty: no   Comments:     Pt meets sepsis criteria, tachycardia, tachypneic and febrile. 2 IV abx started   (including critical care time)  Medications Ordered in ED Medications  sodium chloride 0.9 % bolus 1,000 mL (has no administration in time range)  cefTRIAXone (ROCEPHIN) 1 g in sodium chloride 0.9 % 100 mL IVPB (has no administration in time range)  azithromycin (ZITHROMAX) 500 mg in sodium chloride 0.9 % 250 mL IVPB (has no administration in time range)  acetaminophen (TYLENOL) tablet 1,000 mg (has no  administration in time range)     Initial Impression / Assessment and Plan / ED Course  I have reviewed the triage vital signs and the nursing notes.  Pertinent labs & imaging results that were available during my care of the patient were reviewed by me and considered in my medical decision making (see chart for details).     Pt presenting for evaluation of fever, cough, shortness of breath.  Physical exam shows patient who is tachypneic and using accessory muscles.  She appears ill.  Has tried outpatient antibiotics without improvement in symptoms.  Patient meets sirs criteria as she is tachypneic and tachycardic.  X-ray reviewed and interpreted by me, shows bilateral pneumonia.  Labs show dehydration, no leukocytosis.  Initial lactate negative.  SpO2 stable.  I am concerned due to patient's respiratory status and failure of outpatient antibiotics.  Will start IV fluids and IV antibiotics and consult for admission. Case discussed with attending, Dr. Vanita Panda agrees to plan. Considered PE, but as pt with low grade fever and no risk factors, I believe infection is more likely.   Discussed with Dr. Tyrell Antonio from [facility], pt to be admitted to hospitalist service.    Final Clinical Impressions(s) / ED Diagnoses   Final diagnoses:  Pneumonia of both lower lobes due to infectious organism ([facility])  SIRS (systemic inflammatory response syndrome) [facility])    ED Discharge Orders    None       Franchot Heidelberg, PA-C 06/03/17 1808    Carmin Muskrat, MD 06/03/17 2041    Franchot Heidelberg, PA-C 06/04/17 0050    Carmin Muskrat, MD 06/04/17 [phone]

## 2017-06-03 NOTE — ED Notes (Signed)
Regular dinner tray ordered 

## 2017-06-03 NOTE — ED Triage Notes (Signed)
Pt states 3 weeks of cough with white/yellow mucous, shortness of breath, diagnosis of flu then pneumonia. Took a course of antibiotics and steroids. Still short of breath with a cough. Lung sounds clear to all lobs. Tachypnic and tachycardic during triage. Traveled in plane to [place] last week for vacation. Has pain to central chest and central back. Not on BC pills.

## 2017-06-04 ENCOUNTER — Encounter (HOSPITAL_COMMUNITY): Payer: Self-pay | Admitting: Radiology

## 2017-06-04 ENCOUNTER — Observation Stay (HOSPITAL_COMMUNITY): Payer: No Typology Code available for payment source

## 2017-06-04 DIAGNOSIS — R000 Tachycardia, unspecified: Secondary | ICD-10-CM | POA: Diagnosis not present

## 2017-06-04 DIAGNOSIS — E871 Hypo-osmolality and hyponatremia: Secondary | ICD-10-CM | POA: Diagnosis not present

## 2017-06-04 DIAGNOSIS — L309 Dermatitis, unspecified: Secondary | ICD-10-CM | POA: Diagnosis not present

## 2017-06-04 DIAGNOSIS — Z720 Tobacco use: Secondary | ICD-10-CM | POA: Diagnosis not present

## 2017-06-04 DIAGNOSIS — J189 Pneumonia, unspecified organism: Secondary | ICD-10-CM | POA: Diagnosis not present

## 2017-06-04 DIAGNOSIS — Z21 Asymptomatic human immunodeficiency virus [HIV] infection status: Secondary | ICD-10-CM | POA: Diagnosis not present

## 2017-06-04 LAB — CBC
HEMATOCRIT: 31.5 % — AB (ref 36.0–46.0)
Hemoglobin: 10.4 g/dL — ABNORMAL LOW (ref 12.0–15.0)
MCH: 31.2 pg (ref 26.0–34.0)
MCHC: 33 g/dL (ref 30.0–36.0)
MCV: 94.6 fL (ref 78.0–100.0)
PLATELETS: 335 10*3/uL (ref 150–400)
RBC: 3.33 MIL/uL — AB (ref 3.87–5.11)
RDW: 12.9 % (ref 11.5–15.5)
WBC: 7.4 10*3/uL (ref 4.0–10.5)

## 2017-06-04 LAB — PHOSPHORUS: Phosphorus: 2.1 mg/dL — ABNORMAL LOW (ref 2.5–4.6)

## 2017-06-04 LAB — COMPREHENSIVE METABOLIC PANEL
ALBUMIN: 2.2 g/dL — AB (ref 3.5–5.0)
ALT: 11 U/L — ABNORMAL LOW (ref 14–54)
AST: 25 U/L (ref 15–41)
Alkaline Phosphatase: 51 U/L (ref 38–126)
Anion gap: 5 (ref 5–15)
BILIRUBIN TOTAL: 0.4 mg/dL (ref 0.3–1.2)
BUN: 5 mg/dL — AB (ref 6–20)
CO2: 20 mmol/L — AB (ref 22–32)
Calcium: 7.2 mg/dL — ABNORMAL LOW (ref 8.9–10.3)
Chloride: 110 mmol/L (ref 101–111)
Creatinine, Ser: 0.58 mg/dL (ref 0.44–1.00)
GFR calc Af Amer: >60 mL/min (ref >60)
GFR calc non Af Amer: >60 mL/min (ref >60)
GLUCOSE: 102 mg/dL — AB (ref 65–99)
Potassium: 3.8 mmol/L (ref 3.5–5.1)
SODIUM: 135 mmol/L (ref 135–145)
TOTAL PROTEIN: 7.1 g/dL (ref 6.5–8.1)

## 2017-06-04 LAB — RESPIRATORY PANEL BY PCR
ADENOVIRUS-RVPPCR: NOT DETECTED
BORDETELLA PERTUSSIS-RVPCR: NOT DETECTED
CHLAMYDOPHILA PNEUMONIAE-RVPPCR: NOT DETECTED
CORONAVIRUS HKU1-RVPPCR: NOT DETECTED
CORONAVIRUS NL63-RVPPCR: NOT DETECTED
Coronavirus 229E: DETECTED — AB
Coronavirus OC43: NOT DETECTED
Influenza A: NOT DETECTED
Influenza B: NOT DETECTED
MYCOPLASMA PNEUMONIAE-RVPPCR: NOT DETECTED
Metapneumovirus: NOT DETECTED
PARAINFLUENZA VIRUS 3-RVPPCR: NOT DETECTED
Parainfluenza Virus 1: NOT DETECTED
Parainfluenza Virus 2: NOT DETECTED
Parainfluenza Virus 4: NOT DETECTED
RHINOVIRUS / ENTEROVIRUS - RVPPCR: NOT DETECTED
Respiratory Syncytial Virus: NOT DETECTED

## 2017-06-04 LAB — STREP PNEUMONIAE URINARY ANTIGEN: STREP PNEUMO URINARY ANTIGEN: NEGATIVE

## 2017-06-04 LAB — SEDIMENTATION RATE: SED RATE: 130 mm/h — AB (ref 0–22)

## 2017-06-04 LAB — MAGNESIUM
Magnesium: 1.4 mg/dL — ABNORMAL LOW (ref 1.7–2.4)
Magnesium: 1.5 mg/dL — ABNORMAL LOW (ref 1.7–2.4)

## 2017-06-04 LAB — PROCALCITONIN

## 2017-06-04 LAB — BRAIN NATRIURETIC PEPTIDE: B Natriuretic Peptide: 118.3 pg/mL — ABNORMAL HIGH (ref 0.0–100.0)

## 2017-06-04 LAB — D-DIMER, QUANTITATIVE: D-Dimer, Quant: 0.78 ug/mL-FEU — ABNORMAL HIGH (ref 0.00–0.50)

## 2017-06-04 MED ORDER — IOPAMIDOL (ISOVUE-370) INJECTION 76%
INTRAVENOUS | Status: AC
  Filled 2017-06-04: qty 100

## 2017-06-04 MED ORDER — K PHOS MONO-SOD PHOS DI & MONO 155-852-130 MG PO TABS
250.0000 mg | ORAL_TABLET | Freq: Every day | ORAL | Status: DC
  Administered 2017-06-04 – 2017-06-11 (×8): 250 mg via ORAL
  Filled 2017-06-04 (×8): qty 1

## 2017-06-04 MED ORDER — IOPAMIDOL (ISOVUE-370) INJECTION 76%
100.0000 mL | Freq: Once | INTRAVENOUS | Status: AC | PRN
  Administered 2017-06-04: 100 mL via INTRAVENOUS

## 2017-06-04 MED ORDER — KETOROLAC TROMETHAMINE 15 MG/ML IJ SOLN
15.0000 mg | Freq: Four times a day (QID) | INTRAMUSCULAR | Status: AC | PRN
  Administered 2017-06-05 (×2): 15 mg via INTRAVENOUS
  Filled 2017-06-04 (×3): qty 1

## 2017-06-04 MED ORDER — MAGNESIUM SULFATE 2 GM/50ML IV SOLN
2.0000 g | Freq: Once | INTRAVENOUS | Status: AC
  Administered 2017-06-04: 2 g via INTRAVENOUS
  Filled 2017-06-04: qty 50

## 2017-06-04 NOTE — ED Notes (Signed)
Pt back from CT

## 2017-06-04 NOTE — ED Notes (Signed)
Pt ambulated to restroom with steady gait.  No acute distress noted.

## 2017-06-04 NOTE — ED Notes (Signed)
Patient transported to CT 

## 2017-06-04 NOTE — ED Notes (Signed)
Breakfast tray ordered 

## 2017-06-04 NOTE — ED Notes (Signed)
Pt sleeping soundly. NAD noted. Resp even and non-labored. Will continue to monitor.

## 2017-06-04 NOTE — Progress Notes (Signed)
PROGRESS NOTE    Krystal Short  RKY:[id number] DOB: Feb 21, 1985 DOA: 06/03/2017 PCP: Patient, No Pcp Per   Outpatient Specialists:     Brief Narrative:  Krystal Short is a 32 y.o. female with medical history significant for tobacco abuse otherwise no chronic medical problems.  Patient was diagnosed with influenza on 4/5 and was treated with Tamiflu.  About 2 weeks later patient had not had any improvement in her symptoms return to urgent care and was diagnosed with right lower lobe pneumonia and treated with amoxicillin and steroids.  She has subsequently completed those antibiotics.  Again she has had no improvement in her symptoms and is reporting increased cough, intermittent chest discomfort.  She reports productive sputum that is anywhere from clear to yellow.  She reports subjective fevers.  No nausea vomiting and diarrhea but she has had a poor appetite.  She typically smokes 1 cigar a day but has not smoked in over a month.  In the ER repeat chest x-ray here concerning for either bilateral atelectasis versus evolving bilateral lower lobe pneumonia.  Patient did not have leukocytosis and was not hypoxemic.  She was observed with wet sounding paroxysmal coughing and persistent tachycardia and tachypnea.  She will be treated empirically for community-acquired pneumonia.      Assessment & Plan:   Principal Problem:   CAP (community acquired pneumonia) Active Problems:   Pleurisy   Tachycardia   Facial dermatitis   Tobacco abuse   Acute hyponatremia   CAP (community acquired pneumonia)/ Pleurisy -Patient presents with persistent URI symptoms after outpatient treatment for influenza as well as for right pneumonia (amoxicillin) with apparent failure of outpatient antibiotic treatment -IV abx (for now as pro-calcitonin is negative) -NP swab pending -HIV pending -Scheduled IV Toradol for pleurisy -IV Solu-Medrol 40 mg every 12 hours for pneumonia symptoms -Robitussin PRN for cough  and as mucolytic -Albuterol neb every 4 hours PRN to assist with paroxysmal coughing -Protonix and Pepcid while on scheduled NSAID    Tachycardia -Likely related to ongoing chest wall pain, pleurisy and dehydration -d dimer mildly elevated so have ordered CTA this AM    Acute hyponatremia -Likely secondary to dehydration -improved with IVF    Facial dermatitis  -Patient reports rash as described above ongoing for greater than 1 year -Continue to follow-May need dermatology follow-up as outpatient -HIV pending    Tobacco abuse -Admits to smoking 1 cigar daily up until 2 months ago -Tobacco cessation counseling  Hypomagnesemia/hypophosphatemia -replace  Awaiting CTA, otherwise will need outpatient pulmonary follow up for further work up with PFTs etc   DVT prophylaxis:  Lovenox   Code Status: Full Code   Family Communication:   Disposition Plan:     Consultants:        Subjective: Feeling the same as yesterday, no improvement  Objective: Vitals:   06/03/17 1709 06/03/17 1730 06/03/17 2320 06/04/17 0345  BP:  121/87 110/76 130/90  Pulse:  100 98 77  Resp:  (!) 39 (!) 22 20  Temp:   99.2 F (37.3 C) 97.8 F (36.6 C)  TempSrc:   Oral Oral  SpO2:  91% 97% 97%  Weight: 53.5 kg (118 lb)     Height: 5' 1.5" (1.562 m)       Intake/Output Summary (Last 24 hours) at 06/04/2017 1123 Last data filed at 06/04/2017 0615 Gross per 24 hour  Intake 3350 ml  Output -  Net 3350 ml   Filed Weights   06/03/17 1709  Weight:  53.5 kg (118 lb)    Examination:  General exam: Appears calm and comfortable- thin african [place] female Respiratory system: no increased work of breathing Cardiovascular system: rrr Gastrointestinal system: Abdomen is nondistended, soft and nontender. No organomegaly or masses felt. Normal bowel sounds heard. Central nervous system: Alert and oriented. No focal neurological deficits. Extremities: Symmetric 5 x 5 power. Skin: No  rashes, lesions or ulcers Psychiatry: Judgement and insight appear normal. Mood & affect appropriate.     Data Reviewed: I have personally reviewed following labs and imaging studies  CBC: Recent Labs  Lab 06/03/17 1413  WBC 7.8  NEUTROABS 5.4  HGB 11.7*  HCT 34.4*  MCV 94.5  PLT 518   Basic Metabolic Panel: Recent Labs  Lab 06/03/17 1413 06/03/17 2320 06/04/17 0022  NA 132*  --  135  K 3.7  --  3.8  CL 98*  --  110  CO2 23  --  20*  GLUCOSE 101*  --  102*  BUN 6  --  5*  CREATININE 0.66  --  0.58  CALCIUM 8.2*  --  7.2*  MG  --  1.5* 1.4*  PHOS  --  2.1*  --    GFR: Estimated Creatinine Clearance: 79.5 mL/min (by C-G formula based on SCr of 0.58 mg/dL). Liver Function Tests: Recent Labs  Lab 06/03/17 1413 06/04/17 0022  AST 30 25  ALT 12* 11*  ALKPHOS 69 51  BILITOT 0.6 0.4  PROT 8.4* 7.1  ALBUMIN 2.6* 2.2*   No results for input(s): LIPASE, AMYLASE in the last 168 hours. No results for input(s): AMMONIA in the last 168 hours. Coagulation Profile: No results for input(s): INR, PROTIME in the last 168 hours. Cardiac Enzymes: No results for input(s): CKTOTAL, CKMB, CKMBINDEX, TROPONINI in the last 168 hours. BNP (last 3 results) No results for input(s): PROBNP in the last 8760 hours. HbA1C: No results for input(s): HGBA1C in the last 72 hours. CBG: No results for input(s): GLUCAP in the last 168 hours. Lipid Profile: No results for input(s): CHOL, HDL, LDLCALC, TRIG, CHOLHDL, LDLDIRECT in the last 72 hours. Thyroid Function Tests: No results for input(s): TSH, T4TOTAL, FREET4, T3FREE, THYROIDAB in the last 72 hours. Anemia Panel: No results for input(s): VITAMINB12, FOLATE, FERRITIN, TIBC, IRON, RETICCTPCT in the last 72 hours. Urine analysis:    Component Value Date/Time   COLORURINE YELLOW 12/03/2011 0550   APPEARANCEUR CLOUDY (A) 12/03/2011 0550   LABSPEC 1.021 12/03/2011 0550   PHURINE 6.0 12/03/2011 0550   GLUCOSEU NEGATIVE 12/03/2011 0550    HGBUR LARGE (A) 12/03/2011 0550   BILIRUBINUR NEGATIVE 12/03/2011 0550   KETONESUR NEGATIVE 12/03/2011 0550   PROTEINUR NEGATIVE 12/03/2011 0550   UROBILINOGEN 1.0 12/03/2011 0550   NITRITE NEGATIVE 12/03/2011 0550   LEUKOCYTESUR NEGATIVE 12/03/2011 0550     )No results found for this or any previous visit (from the past 240 hour(s)).    Anti-infectives (From admission, onward)   Start     Dose/Rate Route Frequency Ordered Stop   06/04/17 1800  azithromycin (ZITHROMAX) 500 mg in sodium chloride 0.9 % 250 mL IVPB     500 mg 250 mL/hr over 60 Minutes Intravenous Every 24 hours 06/03/17 1721     06/04/17 1700  cefTRIAXone (ROCEPHIN) 1 g in sodium chloride 0.9 % 100 mL IVPB     1 g 200 mL/hr over 30 Minutes Intravenous Every 24 hours 06/03/17 1721     06/03/17 1645  cefTRIAXone (ROCEPHIN) 1 g in sodium  chloride 0.9 % 100 mL IVPB     1 g 200 mL/hr over 30 Minutes Intravenous  Once 06/03/17 1637 06/03/17 1741   06/03/17 1645  azithromycin (ZITHROMAX) 500 mg in sodium chloride 0.9 % 250 mL IVPB     500 mg 250 mL/hr over 60 Minutes Intravenous  Once 06/03/17 1637 06/03/17 2030       Radiology Studies: Dg Chest 2 View  Result Date: 06/03/2017 CLINICAL DATA:  Persistent cough and shortness of breath EXAM: CHEST - 2 VIEW COMPARISON:  04/13/2016 FINDINGS: Low volumes with patchy bilateral lung opacity distinct from medial right base opacity seen previously. Normal heart size. Normal mediastinal contours. No effusion or pneumothorax. No acute osseous finding. IMPRESSION: Low volumes with bilateral atelectasis or pneumonia. Electronically Signed   By: Monte Fantasia M.D.   On: 06/03/2017 15:08        Scheduled Meds: . enoxaparin (LOVENOX) injection  40 mg Subcutaneous Q24H  . iopamidol      . ipratropium-albuterol  3 mL Nebulization Q6H  . ketorolac  15 mg Intravenous Q6H  . methylPREDNISolone (SOLU-MEDROL) injection  40 mg Intravenous Q12H  . pantoprazole  40 mg Oral Daily  .  sodium chloride flush  3 mL Intravenous Q12H   Continuous Infusions: . sodium chloride 150 mL/hr at 06/04/17 0620  . azithromycin    . cefTRIAXone (ROCEPHIN)  IV    . famotidine (PEPCID) IV 20 mg (06/04/17 1053)  . magnesium sulfate 1 - 4 g bolus IVPB       LOS: 0 days    Time spent: 25 min    Geradine Girt, DO [facility] Pager [phone]  If 7PM-7AM, please contact night-coverage [email or web address] Password [facility] 06/04/2017, 11:23 AM

## 2017-06-05 DIAGNOSIS — K2970 Gastritis, unspecified, without bleeding: Secondary | ICD-10-CM | POA: Diagnosis present

## 2017-06-05 DIAGNOSIS — R21 Rash and other nonspecific skin eruption: Secondary | ICD-10-CM

## 2017-06-05 DIAGNOSIS — R000 Tachycardia, unspecified: Secondary | ICD-10-CM | POA: Diagnosis present

## 2017-06-05 DIAGNOSIS — K1370 Unspecified lesions of oral mucosa: Secondary | ICD-10-CM

## 2017-06-05 DIAGNOSIS — I361 Nonrheumatic tricuspid (valve) insufficiency: Secondary | ICD-10-CM | POA: Diagnosis not present

## 2017-06-05 DIAGNOSIS — Z8701 Personal history of pneumonia (recurrent): Secondary | ICD-10-CM | POA: Diagnosis not present

## 2017-06-05 DIAGNOSIS — F17210 Nicotine dependence, cigarettes, uncomplicated: Secondary | ICD-10-CM

## 2017-06-05 DIAGNOSIS — B379 Candidiasis, unspecified: Secondary | ICD-10-CM | POA: Diagnosis not present

## 2017-06-05 DIAGNOSIS — R091 Pleurisy: Secondary | ICD-10-CM | POA: Diagnosis present

## 2017-06-05 DIAGNOSIS — J189 Pneumonia, unspecified organism: Secondary | ICD-10-CM | POA: Diagnosis present

## 2017-06-05 DIAGNOSIS — E871 Hypo-osmolality and hyponatremia: Secondary | ICD-10-CM | POA: Diagnosis not present

## 2017-06-05 DIAGNOSIS — J9601 Acute respiratory failure with hypoxia: Secondary | ICD-10-CM | POA: Diagnosis not present

## 2017-06-05 DIAGNOSIS — D638 Anemia in other chronic diseases classified elsewhere: Secondary | ICD-10-CM | POA: Diagnosis present

## 2017-06-05 DIAGNOSIS — R6510 Systemic inflammatory response syndrome (SIRS) of non-infectious origin without acute organ dysfunction: Secondary | ICD-10-CM | POA: Diagnosis not present

## 2017-06-05 DIAGNOSIS — R1013 Epigastric pain: Secondary | ICD-10-CM | POA: Diagnosis not present

## 2017-06-05 DIAGNOSIS — F17290 Nicotine dependence, other tobacco product, uncomplicated: Secondary | ICD-10-CM | POA: Diagnosis present

## 2017-06-05 DIAGNOSIS — B370 Candidal stomatitis: Secondary | ICD-10-CM | POA: Diagnosis not present

## 2017-06-05 DIAGNOSIS — B20 Human immunodeficiency virus [HIV] disease: Secondary | ICD-10-CM | POA: Diagnosis not present

## 2017-06-05 DIAGNOSIS — Z21 Asymptomatic human immunodeficiency virus [HIV] infection status: Secondary | ICD-10-CM | POA: Diagnosis not present

## 2017-06-05 DIAGNOSIS — B9729 Other coronavirus as the cause of diseases classified elsewhere: Secondary | ICD-10-CM | POA: Diagnosis present

## 2017-06-05 DIAGNOSIS — J1289 Other viral pneumonia: Secondary | ICD-10-CM

## 2017-06-05 DIAGNOSIS — R609 Edema, unspecified: Secondary | ICD-10-CM | POA: Diagnosis present

## 2017-06-05 DIAGNOSIS — Z720 Tobacco use: Secondary | ICD-10-CM | POA: Diagnosis not present

## 2017-06-05 DIAGNOSIS — L309 Dermatitis, unspecified: Secondary | ICD-10-CM | POA: Diagnosis not present

## 2017-06-05 DIAGNOSIS — Z87440 Personal history of urinary (tract) infections: Secondary | ICD-10-CM | POA: Diagnosis not present

## 2017-06-05 DIAGNOSIS — E876 Hypokalemia: Secondary | ICD-10-CM | POA: Diagnosis present

## 2017-06-05 DIAGNOSIS — Z79899 Other long term (current) drug therapy: Secondary | ICD-10-CM | POA: Diagnosis not present

## 2017-06-05 DIAGNOSIS — B3781 Candidal esophagitis: Secondary | ICD-10-CM | POA: Diagnosis not present

## 2017-06-05 DIAGNOSIS — Z716 Tobacco abuse counseling: Secondary | ICD-10-CM | POA: Diagnosis not present

## 2017-06-05 DIAGNOSIS — B59 Pneumocystosis: Secondary | ICD-10-CM | POA: Diagnosis not present

## 2017-06-05 DIAGNOSIS — K5900 Constipation, unspecified: Secondary | ICD-10-CM | POA: Diagnosis present

## 2017-06-05 DIAGNOSIS — E860 Dehydration: Secondary | ICD-10-CM | POA: Diagnosis present

## 2017-06-05 LAB — HIV 1/2 AB DIFFERENTIATION
HIV 1 AB: POSITIVE — AB
HIV 2 AB: NEGATIVE

## 2017-06-05 LAB — LEGIONELLA PNEUMOPHILA SEROGP 1 UR AG: L. pneumophila Serogp 1 Ur Ag: NEGATIVE

## 2017-06-05 LAB — HIV ANTIBODY (ROUTINE TESTING W REFLEX): HIV SCREEN 4TH GENERATION: REACTIVE — AB

## 2017-06-05 MED ORDER — DEXTROSE 5 % IV SOLN
240.0000 mg | Freq: Four times a day (QID) | INTRAVENOUS | Status: DC
Start: 2017-06-05 — End: 2017-06-08
  Administered 2017-06-05 – 2017-06-08 (×12): 240 mg via INTRAVENOUS
  Filled 2017-06-05 (×13): qty 15

## 2017-06-05 MED ORDER — FAMOTIDINE 20 MG PO TABS
20.0000 mg | ORAL_TABLET | Freq: Two times a day (BID) | ORAL | Status: DC
  Administered 2017-06-05 – 2017-06-11 (×12): 20 mg via ORAL
  Filled 2017-06-05 (×12): qty 1

## 2017-06-05 NOTE — Consult Note (Signed)
Name: Krystal Short MRN: [id number] DOB: October 24, 1985    ADMISSION DATE:  06/03/2017 CONSULTATION DATE:  06/05/2017  REFERRING MD :  Dr. Eliseo Squires  CHIEF COMPLAINT: Dyspnea  BRIEF PATIENT DESCRIPTION: 32 year old female with recent flu and subsequent bronchitis.  Now admitted for "pneumonia".  CT scan of the chest with diffuse infiltrates.  SIGNIFICANT EVENTS    STUDIES:  CTA chest 4/30 > No demonstrable pulmonary embolus. No thoracic aortic aneurysm or Dissection. Widespread airspace opacity bilaterally. There is frank consolidation in portions of the right lower lobe. Diffuse ground-glass opacity elsewhere is likely due to small airways obstructive disease and atelectasis. A degree of alveolar edema superimposed cannot be excluded. Echo 4/30 >      Respiratory Viral 4/28 > Coronavirus 229E  HISTORY OF PRESENT ILLNESS: 32 year old female with no significant past medical history.  She was in her usual state of health into the beginning of April 2019 when she developed upper respiratory type symptoms and was diagnosed with the flu.  She was treated with Tamiflu and initially improved.  In the immediate subsequent period she developed shortness of breath and recurrent cough.  She was seen by her primary care provider and imaging was performed.  It was felt as though she had developed bronchitis and was started on antibiotics, steroids, and was given albuterol.  She initially improved and went on her family's vacation to [place] the week of 4/21, however, her shortness of breath was so profound that she was unable to participate in activities and remained in the hotel room for most of the trip.  Upon returning, she presented to the emergency department with these complaints.  Initial imaging was concerning for hemoglobin infiltrate and she was admitted for recurrent pneumonia.  She was treated with antibiotics and steroids.  She had been having some slow improvement and went for CT angiogram on 4/30 to  rule out PE.  No PE was found, however, there was diffuse airspace disease.  There were some areas of consolidation surrounded by groundglass opacification. Pulmonay asked to see.   She reports smoking one cigar per day (but stopped a few months ago), has about 4-5 alcoholic drinks per week, and does not use illegal drugs. She is married and reports her husband as her only sexual partner. 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 There are pets in the home, but only dogs. No birds. She has no recent environmental or occupational exposures that she knows of. No known sick contacts, but she has been on a plane twice in the past week or so. No family history of autoimmune disease.  PAST MEDICAL HISTORY :   has a past medical history of UTI (urinary tract infection).  has no past surgical history on file. Prior to Admission medications   Medication Sig Start Date End Date Taking? Authorizing Provider  albuterol (PROVENTIL HFA;VENTOLIN HFA) 108 (90 Base) MCG/ACT inhaler Inhale 1-2 puffs into the lungs every 6 (six) hours as needed for wheezing or shortness of breath. 04/13/16  Yes Phelps, Erin O, PA-C  guaiFENesin (MUCINEX PO) Take 1 tablet by mouth 2 (two) times daily as needed (cough/congestion).   Yes [provider]  ibuprofen (ADVIL,MOTRIN) 200 MG tablet Take 400 mg by mouth every 6 (six) hours as needed for fever or headache (pain).   Yes [provider]  amoxicillin-clavulanate (AUGMENTIN) 875-125 MG tablet Take 1 tablet by mouth 2 (two) times daily. 7 day course completed 06/02/17 05/26/17   [provider]  benzonatate (  TESSALON) 200 MG capsule Take 1 capsule (200 mg total) by mouth 3 (three) times daily as needed for cough. Patient not taking: Reported on 06/03/2017 05/11/17   Wynona Luna, MD  Olopatadine HCl (PAZEO) 0.7 % SOLN Apply 1 drop to eye daily. Patient not taking: Reported on 06/03/2017 08/13/16   Shelda Pal, DO  ondansetron (ZOFRAN)  4 MG tablet Take 2 tablets (8 mg total) by mouth every 4 (four) hours as needed for nausea or vomiting. Patient not taking: Reported on 06/03/2017 05/11/17   Wynona Luna, MD  predniSONE (DELTASONE) 10 MG tablet Take 40 mg by mouth daily. 5 day course completed on or about 4/25/109 05/26/17   [provider]   No Known Allergies  FAMILY HISTORY:  family history is not on file. SOCIAL HISTORY:  reports that she has been smoking cigarettes.  She has never used smokeless tobacco. She reports that she drinks alcohol. She reports that she does not use drugs.  REVIEW OF SYSTEMS:   Bolds are positive  Constitutional: weight loss, gain, night sweats, Fevers, chills, fatigue .  HEENT: headaches, Sore throat, sneezing, nasal congestion, post nasal drip, Difficulty swallowing, Tooth/dental problems, visual complaints visual changes, ear ache CV:  chest pain, radiates:,Orthopnea, PND, swelling in lower extremities, dizziness, palpitations, syncope.  GI  heartburn, indigestion, abdominal pain, nausea, vomiting, diarrhea, change in bowel habits, loss of appetite, bloody stools.  Resp: cough, productive: white/occasionally yellow sputum , hemoptysis, dyspnea, chest pain, pleuritic.  Skin: rash or itching or icterus GU: dysuria, change in color of urine, urgency or frequency. flank pain, hematuria  MS: joint pain or swelling. decreased range of motion  Psych: change in mood or affect. depression or anxiety.  Neuro: difficulty with speech, weakness, numbness, ataxia    SUBJECTIVE:   VITAL SIGNS: Temp:  [97.2 F (36.2 C)-97.9 F (36.6 C)] 97.2 F (36.2 C) (04/30 0500) Pulse Rate:  [68-87] 68 (04/30 0500) Resp:  [17-20] 17 (04/30 0500) BP: (121-137)/(81-104) 137/93 (04/30 0500) SpO2:  [96 %-100 %] 96 % (04/30 0759) Weight:  [54.6 kg (120 lb 5.9 oz)] 54.6 kg (120 lb 5.9 oz) (04/29 1622)  PHYSICAL EXAMINATION: General:  Young adult black female in NAD on oxygen Neuro:  Alert,  oriented, non-focal HEENT:  [place]/AT, PERRL, no JVD. Cardiovascular:  Borderline tachy, regular, no MRG. No peripheral edema.  Lungs:  Rales throughout. Unlabored on 2L [place]. O2 sats 99% Abdomen:  Soft, non-tender, non-distended Musculoskeletal:  No acute deformity or ROM limitation Skin:  Grossly intact  Recent Labs  Lab 06/03/17 1413 06/04/17 0022  NA 132* 135  K 3.7 3.8  CL 98* 110  CO2 23 20*  BUN 6 5*  CREATININE 0.66 0.58  GLUCOSE 101* 102*   Recent Labs  Lab 06/03/17 1413 06/04/17 1633  HGB 11.7* 10.4*  HCT 34.4* 31.5*  WBC 7.8 7.4  PLT 350 335   Dg Chest 2 View  Result Date: 06/03/2017 CLINICAL DATA:  Persistent cough and shortness of breath EXAM: CHEST - 2 VIEW COMPARISON:  04/13/2016 FINDINGS: Low volumes with patchy bilateral lung opacity distinct from medial right base opacity seen previously. Normal heart size. Normal mediastinal contours. No effusion or pneumothorax. No acute osseous finding. IMPRESSION: Low volumes with bilateral atelectasis or pneumonia. Electronically Signed   By: Monte Fantasia M.D.   On: 06/03/2017 15:08   Ct Angio Chest Pe W Or Wo Contrast  Result Date: 06/04/2017 CLINICAL DATA:  Shortness of breath and chest pain EXAM: CT  ANGIOGRAPHY CHEST WITH CONTRAST TECHNIQUE: Multidetector CT imaging of the chest was performed using the standard protocol during bolus administration of intravenous contrast. Multiplanar CT image reconstructions and MIPs were obtained to evaluate the vascular anatomy. CONTRAST:  190mL ISOVUE-370 IOPAMIDOL (ISOVUE-370) INJECTION 76% COMPARISON:  Chest radiograph June 03, 2017 FINDINGS: Cardiovascular: There is no demonstrable pulmonary embolus. There is no thoracic aortic aneurysm or dissection. The visualized great vessels appear normal. There is no pericardial effusion or pericardial thickening. Mediastinum/Nodes: Visualized thyroid appears unremarkable. There are scattered subcentimeter mediastinal lymph nodes. There is no  adenopathy by size criteria on this study. There is a small hiatal hernia. Lungs/Pleura: There is multifocal airspace opacity, likely due to a combination of small airways obstructive disease and atelectasis. There is frank consolidation throughout portions of the right lower lobe. There is possibly a degree of superimposed alveolar edema. There are small pleural effusions bilaterally. Upper Abdomen: Visualized upper abdominal structures appear unremarkable. Musculoskeletal: There are no blastic or lytic bone lesions. No chest wall lesions are evident. Review of the MIP images confirms the above findings. IMPRESSION: 1. No demonstrable pulmonary embolus. No thoracic aortic aneurysm or dissection. 2. Widespread airspace opacity bilaterally. There is frank consolidation in portions of the right lower lobe. Diffuse ground-glass opacity elsewhere is likely due to small airways obstructive disease and atelectasis. A degree of alveolar edema superimposed cannot be excluded. 3.  No adenopathy by size criteria. 4.  Small hiatal hernia. Electronically Signed   By: Lowella Grip III M.D.   On: 06/04/2017 15:42    ASSESSMENT / PLAN:  32 year old female with no PMH. Recent flu and never quite returned to baseline. Was felt to have bronchitis and treated with ABX/steroids. Then went to [place] and got worse again. Admitted for CAP. PCT and WBC normal. With recent history of flu, and current coronavirus by PCR. Suspect this is a viral or post-viral process. Doesn't sound like ILD, no true personal or family history that would raise suspicion for this. Although, she does have a facial rash for over a year. There may be an element of pulmonary edema, but she does not appear volume up on exam. If anything she has not been eating or drinking well, so could be some risk of diuresis. BNP borderline high and Echo pending.   A: Diffuse pulmonary infiltrates with hypoxia. Suspect this is viral or post viral process. Cannot  rule out CAP.   P: - Supplemental O2 as indicated to keep SpO2 > 92% - Low threshold to DC antibiotics - No diuresis for now. Will see what echo looks like.  - Continue steroids with slow taper.  - Continue patient feels they help.  - HIV pending - Consider sending autoimmune panel, however, she has been on steroids for a few days now.  Ultimately, may be slow to recover and need to go home on O2 with plans for pulmonary follow up for outpatient continued workup.   Georgann Housekeeper, AGACNP-BC [facility] Pulmonology/Critical Care Pager [phone] or [phone]  06/05/2017 1:05 PM

## 2017-06-05 NOTE — Progress Notes (Signed)
PROGRESS NOTE    Krystal Short  RKY:[id number] DOB: 1985/06/12 DOA: 06/03/2017 PCP: Patient, No Pcp Per   Outpatient Specialists:     Brief Narrative:  Krystal Short is a 32 y.o. female with medical history significant for tobacco abuse otherwise no chronic medical problems.  Patient was diagnosed with influenza on 4/5 and was treated with Tamiflu.  About 2 weeks later patient had not had any improvement in her symptoms return to urgent care and was diagnosed with right lower lobe pneumonia and treated with amoxicillin and steroids.  She has subsequently completed those antibiotics.  Again she has had no improvement in her symptoms and is reporting increased cough, intermittent chest discomfort.  She reports productive sputum that is anywhere from clear to yellow.  She reports subjective fevers.  No nausea vomiting and diarrhea but she has had a poor appetite.  She typically smokes 1 cigar a day but has not smoked in over a month.  In the ER repeat chest x-ray here concerning for either bilateral atelectasis versus evolving bilateral lower lobe pneumonia.  Patient did not have leukocytosis and was not hypoxemic.  She was observed with wet sounding paroxysmal coughing and persistent tachycardia and tachypnea.  She will be treated empirically for community-acquired pneumonia.      Assessment & Plan:   Principal Problem:   CAP (community acquired pneumonia) Active Problems:   Pleurisy   Tachycardia   Facial dermatitis   Tobacco abuse   Acute hyponatremia   Community acquired pneumonia  HIV + -appreciate Dr. Algis Downs consult Per ID: Plan: 1. Check CD4 and HIV RNA and genotype 2. Start bactrim 3. Continue steroids 4. Check RPR, gc/chlamydia 5. Check hepatitis panel 6. Hold ART for now.  7. Sputum for PCP DFA.   CAP (community acquired pneumonia)/ Pleurisy -Patient presents with persistent URI symptoms after outpatient treatment for influenza as well as for right pneumonia  (amoxicillin) with apparent failure of outpatient antibiotic treatment -NP swab with virus -HIV positive, see above -PRN pain meds -IV Solu-Medrol 40 mg every 12 hours for pneumonia symptoms -Robitussin PRN for cough and as mucolytic -Albuterol neb every 4 hours PRN to assist with paroxysmal coughing -Protonix and Pepcid while on scheduled NSAID    Tachycardia -resolved -CTA negative     Acute hyponatremia -Likely secondary to dehydration -improved with IVF    Facial dermatitis  -Patient reports rash as described above ongoing for greater than 1 year -Continue to follow-May need dermatology follow-up as outpatient -HIV positive    Tobacco abuse -Admits to smoking 1 cigar daily up until 2 months ago -Tobacco cessation counseling  Hypomagnesemia/hypophosphatemia -replace     DVT prophylaxis:  Lovenox   Code Status: Full Code   Family Communication:   Disposition Plan:     Consultants:   ID  PCCM     Subjective: Still very short of breath with exertion  Objective: Vitals:   06/05/17 0500 06/05/17 0759 06/05/17 1342 06/05/17 1402  BP: (!) 137/93   (!) 133/96  Pulse: 68   (!) 103  Resp: 17   17  Temp: (!) 97.2 F (36.2 C)   97.6 F (36.4 C)  TempSrc: Oral   Oral  SpO2: 100% 96% 98% 98%  Weight:      Height:        Intake/Output Summary (Last 24 hours) at 06/05/2017 1605 Last data filed at 06/05/2017 1403 Gross per 24 hour  Intake 540 ml  Output -  Net 540 ml  Filed Weights   06/03/17 1709 06/04/17 1622  Weight: 53.5 kg (118 lb) 54.6 kg (120 lb 5.9 oz)    Examination:  General exam: in bed- on 2L Respiratory system: increased work ofbreathing with talking Cardiovascular system: rrr Gastrointestinal system: +BS, soft Central nervous system: alert Extremities: moves all 4 ext     Data Reviewed: I have personally reviewed following labs and imaging studies  CBC: Recent Labs  Lab 06/03/17 1413 06/04/17 1633  WBC 7.8 7.4   NEUTROABS 5.4  --   HGB 11.7* 10.4*  HCT 34.4* 31.5*  MCV 94.5 94.6  PLT 350 322   Basic Metabolic Panel: Recent Labs  Lab 06/03/17 1413 06/03/17 2320 06/04/17 0022  NA 132*  --  135  K 3.7  --  3.8  CL 98*  --  110  CO2 23  --  20*  GLUCOSE 101*  --  102*  BUN 6  --  5*  CREATININE 0.66  --  0.58  CALCIUM 8.2*  --  7.2*  MG  --  1.5* 1.4*  PHOS  --  2.1*  --    GFR: Estimated Creatinine Clearance: 79.5 mL/min (by C-G formula based on SCr of 0.58 mg/dL). Liver Function Tests: Recent Labs  Lab 06/03/17 1413 06/04/17 0022  AST 30 25  ALT 12* 11*  ALKPHOS 69 51  BILITOT 0.6 0.4  PROT 8.4* 7.1  ALBUMIN 2.6* 2.2*   No results for input(s): LIPASE, AMYLASE in the last 168 hours. No results for input(s): AMMONIA in the last 168 hours. Coagulation Profile: No results for input(s): INR, PROTIME in the last 168 hours. Cardiac Enzymes: No results for input(s): CKTOTAL, CKMB, CKMBINDEX, TROPONINI in the last 168 hours. BNP (last 3 results) No results for input(s): PROBNP in the last 8760 hours. HbA1C: No results for input(s): HGBA1C in the last 72 hours. CBG: No results for input(s): GLUCAP in the last 168 hours. Lipid Profile: No results for input(s): CHOL, HDL, LDLCALC, TRIG, CHOLHDL, LDLDIRECT in the last 72 hours. Thyroid Function Tests: No results for input(s): TSH, T4TOTAL, FREET4, T3FREE, THYROIDAB in the last 72 hours. Anemia Panel: No results for input(s): VITAMINB12, FOLATE, FERRITIN, TIBC, IRON, RETICCTPCT in the last 72 hours. Urine analysis:    Component Value Date/Time   COLORURINE YELLOW 12/03/2011 0550   APPEARANCEUR CLOUDY (A) 12/03/2011 0550   LABSPEC 1.021 12/03/2011 0550   PHURINE 6.0 12/03/2011 0550   GLUCOSEU NEGATIVE 12/03/2011 0550   HGBUR LARGE (A) 12/03/2011 0550   BILIRUBINUR NEGATIVE 12/03/2011 0550   KETONESUR NEGATIVE 12/03/2011 0550   PROTEINUR NEGATIVE 12/03/2011 0550   UROBILINOGEN 1.0 12/03/2011 0550   NITRITE NEGATIVE  12/03/2011 0550   LEUKOCYTESUR NEGATIVE 12/03/2011 0550     ) Recent Results (from the past 240 hour(s))  Respiratory Panel by PCR     Status: Abnormal   Collection Time: 06/03/17 11:31 PM  Result Value Ref Range Status   Adenovirus NOT DETECTED NOT DETECTED Final   Coronavirus 229E DETECTED (A) NOT DETECTED Final   Coronavirus HKU1 NOT DETECTED NOT DETECTED Final   Coronavirus NL63 NOT DETECTED NOT DETECTED Final   Coronavirus OC43 NOT DETECTED NOT DETECTED Final   Metapneumovirus NOT DETECTED NOT DETECTED Final   Rhinovirus / Enterovirus NOT DETECTED NOT DETECTED Final   Influenza A NOT DETECTED NOT DETECTED Final   Influenza B NOT DETECTED NOT DETECTED Final   Parainfluenza Virus 1 NOT DETECTED NOT DETECTED Final   Parainfluenza Virus 2 NOT DETECTED NOT  DETECTED Final   Parainfluenza Virus 3 NOT DETECTED NOT DETECTED Final   Parainfluenza Virus 4 NOT DETECTED NOT DETECTED Final   Respiratory Syncytial Virus NOT DETECTED NOT DETECTED Final   Bordetella pertussis NOT DETECTED NOT DETECTED Final   Chlamydophila pneumoniae NOT DETECTED NOT DETECTED Final   Mycoplasma pneumoniae NOT DETECTED NOT DETECTED Final      Anti-infectives (From admission, onward)   Start     Dose/Rate Route Frequency Ordered Stop   06/04/17 1800  azithromycin (ZITHROMAX) 500 mg in sodium chloride 0.9 % 250 mL IVPB  Status:  Discontinued     500 mg 250 mL/hr over 60 Minutes Intravenous Every 24 hours 06/03/17 1721 06/05/17 1553   06/04/17 1700  cefTRIAXone (ROCEPHIN) 1 g in sodium chloride 0.9 % 100 mL IVPB  Status:  Discontinued     1 g 200 mL/hr over 30 Minutes Intravenous Every 24 hours 06/03/17 1721 06/05/17 1553   06/03/17 1645  cefTRIAXone (ROCEPHIN) 1 g in sodium chloride 0.9 % 100 mL IVPB     1 g 200 mL/hr over 30 Minutes Intravenous  Once 06/03/17 1637 06/03/17 1741   06/03/17 1645  azithromycin (ZITHROMAX) 500 mg in sodium chloride 0.9 % 250 mL IVPB     500 mg 250 mL/hr over 60 Minutes  Intravenous  Once 06/03/17 1637 06/03/17 2030       Radiology Studies: Ct Angio Chest Pe W Or Wo Contrast  Result Date: 06/04/2017 CLINICAL DATA:  Shortness of breath and chest pain EXAM: CT ANGIOGRAPHY CHEST WITH CONTRAST TECHNIQUE: Multidetector CT imaging of the chest was performed using the standard protocol during bolus administration of intravenous contrast. Multiplanar CT image reconstructions and MIPs were obtained to evaluate the vascular anatomy. CONTRAST:  150mL ISOVUE-370 IOPAMIDOL (ISOVUE-370) INJECTION 76% COMPARISON:  Chest radiograph June 03, 2017 FINDINGS: Cardiovascular: There is no demonstrable pulmonary embolus. There is no thoracic aortic aneurysm or dissection. The visualized great vessels appear normal. There is no pericardial effusion or pericardial thickening. Mediastinum/Nodes: Visualized thyroid appears unremarkable. There are scattered subcentimeter mediastinal lymph nodes. There is no adenopathy by size criteria on this study. There is a small hiatal hernia. Lungs/Pleura: There is multifocal airspace opacity, likely due to a combination of small airways obstructive disease and atelectasis. There is frank consolidation throughout portions of the right lower lobe. There is possibly a degree of superimposed alveolar edema. There are small pleural effusions bilaterally. Upper Abdomen: Visualized upper abdominal structures appear unremarkable. Musculoskeletal: There are no blastic or lytic bone lesions. No chest wall lesions are evident. Review of the MIP images confirms the above findings. IMPRESSION: 1. No demonstrable pulmonary embolus. No thoracic aortic aneurysm or dissection. 2. Widespread airspace opacity bilaterally. There is frank consolidation in portions of the right lower lobe. Diffuse ground-glass opacity elsewhere is likely due to small airways obstructive disease and atelectasis. A degree of alveolar edema superimposed cannot be excluded. 3.  No adenopathy by size  criteria. 4.  Small hiatal hernia. Electronically Signed   By: Lowella Grip III M.D.   On: 06/04/2017 15:42        Scheduled Meds: . enoxaparin (LOVENOX) injection  40 mg Subcutaneous Q24H  . famotidine  20 mg Oral BID  . ipratropium-albuterol  3 mL Nebulization Q6H  . methylPREDNISolone (SOLU-MEDROL) injection  40 mg Intravenous Q12H  . pantoprazole  40 mg Oral Daily  . phosphorus  250 mg Oral Daily  . sodium chloride flush  3 mL Intravenous Q12H   Continuous Infusions:  LOS: 0 days    Time spent: 25 min    Geradine Girt, DO [facility] Pager [phone]  If 7PM-7AM, please contact night-coverage [email or web address] Password [email or web address] 06/05/2017, 4:05 PM

## 2017-06-05 NOTE — Progress Notes (Signed)
ANTIBIOTIC CONSULT NOTE - INITIAL  Pharmacy Consult for Bactrim Indication: pneumonia PCP  No Known Allergies  Patient Measurements: Height: 5' 1.5" (156.2 cm) Weight: 120 lb 5.9 oz (54.6 kg) IBW/kg (Calculated) : 48.95 Adjusted Body Weight:   Vital Signs: Temp: 97.6 F (36.4 C) (04/30 1402) Temp Source: Oral (04/30 1402) BP: 133/96 (04/30 1402) Pulse Rate: 103 (04/30 1402) Intake/Output from previous day: 04/29 0701 - 04/30 0700 In: 400 [IV Piggyback:400] Out: -  Intake/Output from this shift: Total I/O In: 240 [P.O.:240] Out: -   Labs: Recent Labs    06/03/17 1413 06/04/17 0022 06/04/17 1633  WBC 7.8  --  7.4  HGB 11.7*  --  10.4*  PLT 350  --  335  CREATININE 0.66 0.58  --    Estimated Creatinine Clearance: 79.5 mL/min (by C-G formula based on SCr of 0.58 mg/dL). No results for input(s): VANCOTROUGH, VANCOPEAK, VANCORANDOM, GENTTROUGH, GENTPEAK, GENTRANDOM, TOBRATROUGH, TOBRAPEAK, TOBRARND, AMIKACINPEAK, AMIKACINTROU, AMIKACIN in the last 72 hours.   Microbiology:   Medical History: Past Medical History:  Diagnosis Date  . UTI (urinary tract infection)    Assessment: CC/HPI: Recent flu and bronchitis. Treated with Tamiflu. Developed SOB and recurrent cough. Treated outpatient for bronchitis.CT scan of the chest with diffuse infiltrates  PMH: none  Significant events: Recent flu and bronchitis  ID: new dx HIV, non-resolving PNA, thrush: viral or post viral process, cannot r/o CAP; D3 CTX + azith, steroids, PCT <0.1. WBC 7.4  4/28: RVP: Coronavirus 229E 4/28: Rocephin>>4/30 4/28: Azithro>>4/30  Goal of Therapy:  Eradication of infection  Plan:  Bactrim IV 240mg  IV q 6 hrs (17mg /kg/d trimethoprim)  Krystal Short, PharmD, BCPS Clinical Staff Pharmacist Pager [phone]  Krystal Short 06/05/2017,4:11 PM

## 2017-06-05 NOTE — Consult Note (Signed)
[facility] for Infectious Disease    Date of Admission:  06/03/2017   Total days of antibiotics: 2 ceftriaxone/azithro               Reason for Consult: HIV+    Referring Provider: CHAMP   Assessment: HIV+ Pneumonia thrush  Plan: 1. Check CD4 and HIV RNA and genotype 2. Start bactrim 3. Continue steroids 4. Check RPR, gc/chlamydia 5. Check hepatitis panel 6. Hold ART for now.  7. Can stop ceftriaxone/azithro 8. Sputum for PCP DFA.   Comment- I disclosed her dx to her. I let her know that we are awaiting more test to confirm her dx.  Her husband will be her this pm.    Thank you so much for this truly  interesting consult,  Principal Problem:   CAP (community acquired pneumonia) Active Problems:   Pleurisy   Tachycardia   Facial dermatitis   Tobacco abuse   Acute hyponatremia   Community acquired pneumonia   . enoxaparin (LOVENOX) injection  40 mg Subcutaneous Q24H  . famotidine  20 mg Oral BID  . ipratropium-albuterol  3 mL Nebulization Q6H  . methylPREDNISolone (SOLU-MEDROL) injection  40 mg Intravenous Q12H  . pantoprazole  40 mg Oral Daily  . phosphorus  250 mg Oral Daily  . sodium chloride flush  3 mL Intravenous Q12H    HPI: Krystal Short is a 32 y.o. female with several weeks of DOE and then cough. She states she had worsening doe with her cough. She had a few episodes of f/c.  Her PCP gve her a course of prednisone which improved her however she then worsened.  She did have oral lesions at her last PCP f/u and was given a mouth wash for this .  She is now found to have a positive HIV test.  She is also found to have corona virus positive on her viral screen.   She has been married for 1 year. Her last HIV test was several years ago (-).  She has also noted a bumpy skin rash on her face for the last year.   Review of Systems: Review of Systems  Constitutional: Positive for chills and fever.  Respiratory: Positive for cough and  shortness of breath.   Gastrointestinal: Negative for constipation and diarrhea.  Genitourinary: Negative for dysuria.  Skin: Positive for rash.  no lymphadenopathy Normal periods.  ,Please see HPI. All other systems reviewed and negative.   Past Medical History:  Diagnosis Date  . UTI (urinary tract infection)     Social History   Tobacco Use  . Smoking status: Current Some Day Smoker    Types: Cigarettes  . Smokeless tobacco: Never Used  Substance Use Topics  . Alcohol use: Yes  . Drug use: No    No family history on file.   Medications:  Scheduled: . enoxaparin (LOVENOX) injection  40 mg Subcutaneous Q24H  . famotidine  20 mg Oral BID  . ipratropium-albuterol  3 mL Nebulization Q6H  . methylPREDNISolone (SOLU-MEDROL) injection  40 mg Intravenous Q12H  . pantoprazole  40 mg Oral Daily  . phosphorus  250 mg Oral Daily  . sodium chloride flush  3 mL Intravenous Q12H    Abtx:  Anti-infectives (From admission, onward)   Start     Dose/Rate Route Frequency Ordered Stop   06/04/17 1800  azithromycin (ZITHROMAX) 500 mg in sodium chloride 0.9 % 250 mL IVPB     500  mg 250 mL/hr over 60 Minutes Intravenous Every 24 hours 06/03/17 1721     06/04/17 1700  cefTRIAXone (ROCEPHIN) 1 g in sodium chloride 0.9 % 100 mL IVPB     1 g 200 mL/hr over 30 Minutes Intravenous Every 24 hours 06/03/17 1721     06/03/17 1645  cefTRIAXone (ROCEPHIN) 1 g in sodium chloride 0.9 % 100 mL IVPB     1 g 200 mL/hr over 30 Minutes Intravenous  Once 06/03/17 1637 06/03/17 1741   06/03/17 1645  azithromycin (ZITHROMAX) 500 mg in sodium chloride 0.9 % 250 mL IVPB     500 mg 250 mL/hr over 60 Minutes Intravenous  Once 06/03/17 1637 06/03/17 2030        OBJECTIVE: Blood pressure (!) 133/96, pulse (!) 103, temperature 97.6 F (36.4 C), temperature source Oral, resp. rate 17, height 5' 1.5" (1.562 m), weight 54.6 kg (120 lb 5.9 oz), last menstrual period 05/30/2017, SpO2 98 %.  Physical Exam    Constitutional: She is oriented to person, place, and time. She appears well-developed and well-nourished.  HENT:  Mouth/Throat: Oropharyngeal exudate present.  Eyes: Pupils are equal, round, and reactive to light. EOM are normal.  Neck: Normal range of motion. Neck supple.  Cardiovascular: Normal rate, regular rhythm and normal heart sounds.  Pulmonary/Chest: Effort normal. She has decreased breath sounds.  Abdominal: Soft. Bowel sounds are normal. She exhibits no distension. There is no tenderness.  Lymphadenopathy:    She has no cervical adenopathy.  Neurological: She is alert and oriented to person, place, and time.  Skin: Skin is warm and dry. Rash noted.    Lab Results Results for orders placed or performed during the hospital encounter of 06/03/17 (from the past 48 hour(s))  I-Stat CG4 Lactic Acid, ED     Status: None   Collection Time: 06/03/17  5:20 PM  Result Value Ref Range   Lactic Acid, Venous 1.47 0.5 - 1.9 mmol/L  Procalcitonin - Baseline     Status: None   Collection Time: 06/03/17 11:20 PM  Result Value Ref Range   Procalcitonin <0.10 ng/mL    Comment:        Interpretation: PCT (Procalcitonin) <= 0.5 ng/mL: Systemic infection (sepsis) is not likely. Local bacterial infection is possible. (NOTE)       Sepsis PCT Algorithm           Lower Respiratory Tract                                      Infection PCT Algorithm    ----------------------------     ----------------------------         PCT < 0.25 ng/mL                PCT < 0.10 ng/mL         Strongly encourage             Strongly discourage   discontinuation of antibiotics    initiation of antibiotics    ----------------------------     -----------------------------       PCT 0.25 - 0.50 ng/mL            PCT 0.10 - 0.25 ng/mL               OR       >80% decrease in PCT  Discourage initiation of                                            antibiotics      Encourage discontinuation           of  antibiotics    ----------------------------     -----------------------------         PCT >= 0.50 ng/mL              PCT 0.26 - 0.50 ng/mL               AND        <80% decrease in PCT             Encourage initiation of                                             antibiotics       Encourage continuation           of antibiotics    ----------------------------     -----------------------------        PCT >= 0.50 ng/mL                  PCT > 0.50 ng/mL               AND         increase in PCT                  Strongly encourage                                      initiation of antibiotics    Strongly encourage escalation           of antibiotics                                     -----------------------------                                           PCT <= 0.25 ng/mL                                                 OR                                        > 80% decrease in PCT                                     Discontinue / Do not initiate  antibiotics Performed at [facility], [place] [address], [place], [place] [address]   Sedimentation rate     Status: Abnormal   Collection Time: 06/03/17 11:20 PM  Result Value Ref Range   Sed Rate 130 (H) 0 - 22 mm/hr    Comment: Performed at [facility] [address], [place], [place] [address]  HIV antibody (Routine Screening)     Status: Abnormal   Collection Time: 06/03/17 11:20 PM  Result Value Ref Range   HIV Screen 4th Generation wRfx Reactive (A) Non Reactive    Comment: (NOTE)                  Client Requested Flag See additional algorithm testing elsewhere in this report. Performed At: [facility] [place], [place] [id number] Rush Farmer MD [id number] Performed at [facility], [place] [address], [place], [place] [address]   Magnesium     Status: Abnormal   Collection Time: 06/03/17 11:20 PM  Result Value Ref Range   Magnesium 1.5  (L) 1.7 - 2.4 mg/dL    Comment: Performed at [facility] [address] [place], [place] [address]  Phosphorus     Status: Abnormal   Collection Time: 06/03/17 11:20 PM  Result Value Ref Range   Phosphorus 2.1 (L) 2.5 - 4.6 mg/dL    Comment: Performed at [facility] [address] [place], [place] [address]  D-dimer, quantitative (not at [facility])     Status: Abnormal   Collection Time: 06/03/17 11:20 PM  Result Value Ref Range   D-Dimer, Quant 0.78 (H) 0.00 - 0.50 ug/mL-FEU    Comment: (NOTE) At the manufacturer cut-off of 0.50 ug/mL FEU, this assay has been documented to exclude PE with a sensitivity and negative predictive value of 97 to 99%.  At this time, this assay has not been approved by the FDA to exclude DVT/VTE. Results should be correlated with clinical presentation. Performed at [facility], [place] [address] [place], [place] [address]   HIV 1/2 Ab Differentiation     Status: Abnormal   Collection Time: 06/03/17 11:20 PM  Result Value Ref Range   HIV 1 Ab Positive (A) Negative    Comment:                   Client Requested Flag   HIV 2 Ab Negative Negative   Note SEE COMMENT     Comment: (NOTE) RESULT:HIV-1 Positive Laboratory evidence consistent with established HIV-1 infection is present. Performed At: [facility] [place], [place] [id number] Rush Farmer MD [id number] Performed at [facility], [place] [address], [place],  [address]   Respiratory Panel by PCR     Status: Abnormal   Collection Time: 06/03/17 11:31 PM  Result Value Ref Range   Adenovirus NOT DETECTED NOT DETECTED   Coronavirus 229E DETECTED (A) NOT DETECTED   Coronavirus HKU1 NOT DETECTED NOT DETECTED   Coronavirus NL63 NOT DETECTED NOT DETECTED   Coronavirus OC43 NOT DETECTED NOT DETECTED   Metapneumovirus NOT DETECTED NOT DETECTED   Rhinovirus / Enterovirus NOT DETECTED NOT DETECTED   Influenza A NOT DETECTED NOT DETECTED    Influenza B NOT DETECTED NOT DETECTED   Parainfluenza Virus 1 NOT DETECTED NOT DETECTED   Parainfluenza Virus 2 NOT DETECTED NOT DETECTED   Parainfluenza Virus 3 NOT DETECTED NOT DETECTED   Parainfluenza Virus 4 NOT DETECTED NOT DETECTED  Respiratory Syncytial Virus NOT DETECTED NOT DETECTED   Bordetella pertussis NOT DETECTED NOT DETECTED   Chlamydophila pneumoniae NOT DETECTED NOT DETECTED   Mycoplasma pneumoniae NOT DETECTED NOT DETECTED  Strep pneumoniae urinary antigen     Status: None   Collection Time: 06/03/17 11:31 PM  Result Value Ref Range   Strep Pneumo Urinary Antigen NEGATIVE NEGATIVE    Comment:        Infection due to S. pneumoniae cannot be absolutely ruled out since the antigen present may be below the detection limit of the test. Performed at [facility], [address], [place], [place] [address]   Legionella Pneumophila Serogp 1 Ur Ag     Status: None   Collection Time: 06/03/17 11:31 PM  Result Value Ref Range   L. pneumophila Serogp 1 Ur Ag Negative Negative    Comment: (NOTE) Presumptive negative for L. pneumophila serogroup 1 antigen in urine, suggesting no recent or current infection. Legionnaires' disease cannot be ruled out since other serogroups and species may also cause disease. Performed At: [facility] [place], [place] [id number] Rush Farmer MD [id number]    Source of Sample URINE, RANDOM   Magnesium     Status: Abnormal   Collection Time: 06/04/17 12:22 AM  Result Value Ref Range   Magnesium 1.4 (L) 1.7 - 2.4 mg/dL    Comment: Performed at [facility] [address], [place], [place] [address]  Comprehensive metabolic panel     Status: Abnormal   Collection Time: 06/04/17 12:22 AM  Result Value Ref Range   Sodium 135 135 - 145 mmol/L   Potassium 3.8 3.5 - 5.1 mmol/L   Chloride 110 101 - 111 mmol/L   CO2 20 (L) 22 - 32 mmol/L   Glucose, Bld 102 (H) 65 - 99 mg/dL   BUN 5 (L) 6 - 20 mg/dL    Creatinine, Ser 0.58 0.44 - 1.00 mg/dL   Calcium 7.2 (L) 8.9 - 10.3 mg/dL   Total Protein 7.1 6.5 - 8.1 g/dL   Albumin 2.2 (L) 3.5 - 5.0 g/dL   AST 25 15 - 41 U/L   ALT 11 (L) 14 - 54 U/L   Alkaline Phosphatase 51 38 - 126 U/L   Total Bilirubin 0.4 0.3 - 1.2 mg/dL   GFR calc non Af Amer >60 >60 mL/min   GFR calc Af Amer >60 >60 mL/min    Comment: (NOTE) The eGFR has been calculated using the CKD EPI equation. This calculation has not been validated in all clinical situations. eGFR's persistently <60 mL/min signify possible Chronic Kidney Disease.    Anion gap 5 5 - 15    Comment: Performed at [facility] [address], [place], [place] [address]  CBC     Status: Abnormal   Collection Time: 06/04/17  4:33 PM  Result Value Ref Range   WBC 7.4 4.0 - 10.5 K/uL   RBC 3.33 (L) 3.87 - 5.11 MIL/uL   Hemoglobin 10.4 (L) 12.0 - 15.0 g/dL   HCT 31.5 (L) 36.0 - 46.0 %   MCV 94.6 78.0 - 100.0 fL   MCH 31.2 26.0 - 34.0 pg   MCHC 33.0 30.0 - 36.0 g/dL   RDW 12.9 11.5 - 15.5 %   Platelets 335 150 - 400 K/uL    Comment: Performed at [facility] [address], [place], [place] [address]  Brain natriuretic peptide     Status: Abnormal  Collection Time: 06/04/17  4:33 PM  Result Value Ref Range   B Natriuretic Peptide 118.3 (H) 0.0 - 100.0 pg/mL    Comment: Performed at [facility] [address], [place], [place] [address]      Component Value Date/Time   SDES THROAT 05/11/2017 1639   SPECREQUEST NONE 05/11/2017 1639   CULT  05/11/2017 1639    NO GROUP A STREP (S.PYOGENES) ISOLATED Performed at [facility], [place] [address] [place], [place] [address]    REPTSTATUS 05/14/2017 FINAL 05/11/2017 1639   Ct Angio Chest Pe W Or Wo Contrast  Result Date: 06/04/2017 CLINICAL DATA:  Shortness of breath and chest pain EXAM: CT ANGIOGRAPHY CHEST WITH CONTRAST TECHNIQUE: Multidetector CT imaging of the chest was performed using the standard protocol during bolus  administration of intravenous contrast. Multiplanar CT image reconstructions and MIPs were obtained to evaluate the vascular anatomy. CONTRAST:  133m ISOVUE-370 IOPAMIDOL (ISOVUE-370) INJECTION 76% COMPARISON:  Chest radiograph June 03, 2017 FINDINGS: Cardiovascular: There is no demonstrable pulmonary embolus. There is no thoracic aortic aneurysm or dissection. The visualized great vessels appear normal. There is no pericardial effusion or pericardial thickening. Mediastinum/Nodes: Visualized thyroid appears unremarkable. There are scattered subcentimeter mediastinal lymph nodes. There is no adenopathy by size criteria on this study. There is a small hiatal hernia. Lungs/Pleura: There is multifocal airspace opacity, likely due to a combination of small airways obstructive disease and atelectasis. There is frank consolidation throughout portions of the right lower lobe. There is possibly a degree of superimposed alveolar edema. There are small pleural effusions bilaterally. Upper Abdomen: Visualized upper abdominal structures appear unremarkable. Musculoskeletal: There are no blastic or lytic bone lesions. No chest wall lesions are evident. Review of the MIP images confirms the above findings. IMPRESSION: 1. No demonstrable pulmonary embolus. No thoracic aortic aneurysm or dissection. 2. Widespread airspace opacity bilaterally. There is frank consolidation in portions of the right lower lobe. Diffuse ground-glass opacity elsewhere is likely due to small airways obstructive disease and atelectasis. A degree of alveolar edema superimposed cannot be excluded. 3.  No adenopathy by size criteria. 4.  Small hiatal hernia. Electronically Signed   By: WLowella GripIII M.D.   On: 06/04/2017 15:42   Recent Results (from the past 240 hour(s))  Respiratory Panel by PCR     Status: Abnormal   Collection Time: 06/03/17 11:31 PM  Result Value Ref Range Status   Adenovirus NOT DETECTED NOT DETECTED Final   Coronavirus  229E DETECTED (A) NOT DETECTED Final   Coronavirus HKU1 NOT DETECTED NOT DETECTED Final   Coronavirus NL63 NOT DETECTED NOT DETECTED Final   Coronavirus OC43 NOT DETECTED NOT DETECTED Final   Metapneumovirus NOT DETECTED NOT DETECTED Final   Rhinovirus / Enterovirus NOT DETECTED NOT DETECTED Final   Influenza A NOT DETECTED NOT DETECTED Final   Influenza B NOT DETECTED NOT DETECTED Final   Parainfluenza Virus 1 NOT DETECTED NOT DETECTED Final   Parainfluenza Virus 2 NOT DETECTED NOT DETECTED Final   Parainfluenza Virus 3 NOT DETECTED NOT DETECTED Final   Parainfluenza Virus 4 NOT DETECTED NOT DETECTED Final   Respiratory Syncytial Virus NOT DETECTED NOT DETECTED Final   Bordetella pertussis NOT DETECTED NOT DETECTED Final   Chlamydophila pneumoniae NOT DETECTED NOT DETECTED Final   Mycoplasma pneumoniae NOT DETECTED NOT DETECTED Final    Microbiology: Recent Results (from the past 240 hour(s))  Respiratory Panel by PCR     Status: Abnormal   Collection  Time: 06/03/17 11:31 PM  Result Value Ref Range Status   Adenovirus NOT DETECTED NOT DETECTED Final   Coronavirus 229E DETECTED (A) NOT DETECTED Final   Coronavirus HKU1 NOT DETECTED NOT DETECTED Final   Coronavirus NL63 NOT DETECTED NOT DETECTED Final   Coronavirus OC43 NOT DETECTED NOT DETECTED Final   Metapneumovirus NOT DETECTED NOT DETECTED Final   Rhinovirus / Enterovirus NOT DETECTED NOT DETECTED Final   Influenza A NOT DETECTED NOT DETECTED Final   Influenza B NOT DETECTED NOT DETECTED Final   Parainfluenza Virus 1 NOT DETECTED NOT DETECTED Final   Parainfluenza Virus 2 NOT DETECTED NOT DETECTED Final   Parainfluenza Virus 3 NOT DETECTED NOT DETECTED Final   Parainfluenza Virus 4 NOT DETECTED NOT DETECTED Final   Respiratory Syncytial Virus NOT DETECTED NOT DETECTED Final   Bordetella pertussis NOT DETECTED NOT DETECTED Final   Chlamydophila pneumoniae NOT DETECTED NOT DETECTED Final   Mycoplasma pneumoniae NOT DETECTED  NOT DETECTED Final    Radiographs and labs were personally reviewed by me.   Bobby Rumpf, MD [facility] [phone] 06/05/2017, 3:46 PM

## 2017-06-06 ENCOUNTER — Inpatient Hospital Stay (HOSPITAL_COMMUNITY): Payer: No Typology Code available for payment source

## 2017-06-06 DIAGNOSIS — B3781 Candidal esophagitis: Secondary | ICD-10-CM

## 2017-06-06 DIAGNOSIS — J189 Pneumonia, unspecified organism: Secondary | ICD-10-CM

## 2017-06-06 DIAGNOSIS — Z21 Asymptomatic human immunodeficiency virus [HIV] infection status: Secondary | ICD-10-CM

## 2017-06-06 DIAGNOSIS — I361 Nonrheumatic tricuspid (valve) insufficiency: Secondary | ICD-10-CM

## 2017-06-06 LAB — HEPATITIS PANEL, ACUTE
HCV Ab: 0.1 s/co ratio (ref 0.0–0.9)
HEP B C IGM: NEGATIVE
Hep A IgM: NEGATIVE
Hepatitis B Surface Ag: NEGATIVE

## 2017-06-06 LAB — T-HELPER CELLS (CD4) COUNT (NOT AT ARMC)
CD4 % Helper T Cell: 1 % — ABNORMAL LOW (ref 33–55)
CD4 T Cell Abs: 10 /uL — ABNORMAL LOW (ref 400–2700)

## 2017-06-06 LAB — BASIC METABOLIC PANEL
ANION GAP: 9 (ref 5–15)
BUN: <5 mg/dL — ABNORMAL LOW (ref 6–20)
CHLORIDE: 102 mmol/L (ref 101–111)
CO2: 25 mmol/L (ref 22–32)
Calcium: 8.4 mg/dL — ABNORMAL LOW (ref 8.9–10.3)
Creatinine, Ser: 0.59 mg/dL (ref 0.44–1.00)
GFR calc non Af Amer: >60 mL/min (ref >60)
Glucose, Bld: 110 mg/dL — ABNORMAL HIGH (ref 65–99)
Potassium: 3.8 mmol/L (ref 3.5–5.1)
Sodium: 136 mmol/L (ref 135–145)

## 2017-06-06 LAB — MAGNESIUM: Magnesium: 1.7 mg/dL (ref 1.7–2.4)

## 2017-06-06 LAB — ECHOCARDIOGRAM COMPLETE
HEIGHTINCHES: 61.5 in
Weight: 1925.94 oz

## 2017-06-06 LAB — CBC
HEMATOCRIT: 29.7 % — AB (ref 36.0–46.0)
HEMOGLOBIN: 9.9 g/dL — AB (ref 12.0–15.0)
MCH: 31.3 pg (ref 26.0–34.0)
MCHC: 33.3 g/dL (ref 30.0–36.0)
MCV: 94 fL (ref 78.0–100.0)
Platelets: 332 10*3/uL (ref 150–400)
RBC: 3.16 MIL/uL — AB (ref 3.87–5.11)
RDW: 13.1 % (ref 11.5–15.5)
WBC: 9.7 10*3/uL (ref 4.0–10.5)

## 2017-06-06 LAB — RPR: RPR Ser Ql: NONREACTIVE

## 2017-06-06 MED ORDER — IPRATROPIUM-ALBUTEROL 0.5-2.5 (3) MG/3ML IN SOLN
3.0000 mL | Freq: Three times a day (TID) | RESPIRATORY_TRACT | Status: DC
  Administered 2017-06-06 – 2017-06-07 (×6): 3 mL via RESPIRATORY_TRACT
  Filled 2017-06-06 (×6): qty 3

## 2017-06-06 MED ORDER — PREDNISONE 20 MG PO TABS
40.0000 mg | ORAL_TABLET | Freq: Two times a day (BID) | ORAL | Status: AC
  Administered 2017-06-06 – 2017-06-08 (×4): 40 mg via ORAL
  Filled 2017-06-06 (×5): qty 2

## 2017-06-06 MED ORDER — FLUCONAZOLE 100 MG PO TABS
100.0000 mg | ORAL_TABLET | Freq: Every day | ORAL | Status: DC
  Administered 2017-06-06 – 2017-06-11 (×6): 100 mg via ORAL
  Filled 2017-06-06 (×6): qty 1

## 2017-06-06 MED ORDER — PREDNISONE 20 MG PO TABS: 20.0000 mg | ORAL_TABLET | Freq: Every day | ORAL | Status: DC

## 2017-06-06 MED ORDER — PREDNISONE 20 MG PO TABS
40.0000 mg | ORAL_TABLET | Freq: Every day | ORAL | Status: DC
  Administered 2017-06-09 – 2017-06-11 (×3): 40 mg via ORAL
  Filled 2017-06-06 (×2): qty 2

## 2017-06-06 NOTE — Progress Notes (Addendum)
PROGRESS NOTE    Kharma Sampsel  KJZ:[id number] DOB: 11/29/1985 DOA: 06/03/2017 PCP: Patient, No Pcp Per   Outpatient Specialists:     Brief Narrative:  Alycen Mack is a 32 y.o. female with medical history significant for tobacco abuse otherwise no chronic medical problems.  Patient was diagnosed with influenza on 4/5 and was treated with Tamiflu.  About 2 weeks later patient had not had any improvement in her symptoms return to urgent care and was diagnosed with right lower lobe pneumonia and treated with amoxicillin and steroids.  She has subsequently completed those antibiotics.  Again she has had no improvement in her symptoms and is reporting increased cough, intermittent chest discomfort.  She reports productive sputum that is anywhere from clear to yellow.  She reports subjective fevers.  No nausea vomiting and diarrhea but she has had a poor appetite.  She typically smokes 1 cigar a day but has not smoked in over a month.  In the ER repeat chest x-ray here concerning for either bilateral atelectasis versus evolving bilateral lower lobe pneumonia.  Patient did not have leukocytosis and was not hypoxemic.  She was observed with wet sounding paroxysmal coughing and persistent tachycardia and tachypnea.  She will be treated empirically for community-acquired pneumonia.      Assessment & Plan:   Principal Problem:   CAP (community acquired pneumonia) Active Problems:   Pleurisy   Tachycardia   Facial dermatitis   Tobacco abuse   Acute hyponatremia   Community acquired pneumonia  HIV + -appreciate Dr. Algis Downs consult Per ID: Plan: 1. Check CD4 and HIV RNA and genotype 2. Start bactrim 3. Continue steroids 4. Check RPR, gc/chlamydia 5. Check hepatitis panel 6. Hold ART for now.  7. Sputum for PCP DFA.   Acute respiratory failure with hypoxia -wean O2 as tolerated -BNP elevated-- echo pending -wean as tolerated -? PCP PNA  CAP (community acquired pneumonia)/  Pleurisy -NP swab with coronavirus -HIV positive, see above -PRN pain meds -steroids -Robitussin PRN for cough and as mucolytic -Albuterol neb every 4 hours PRN to assist with paroxysmal coughing    Tachycardia -resolved -CTA negative for PE    Acute hyponatremia -Likely secondary to dehydration -improved with IVF    Facial dermatitis  -Patient reports rash as described above ongoing for greater than 1 year -Continue to follow-May need dermatology follow-up as outpatient -HIV positive    Tobacco abuse -Admits to smoking 1 cigar daily up until 2 months ago -Tobacco cessation counseling  Hypomagnesemia/hypophosphatemia/hypokalemia -replace     DVT prophylaxis:  Lovenox   Code Status: Full Code   Family Communication: Husband at bedside  Disposition Plan:     Consultants:   ID  PCCM     Subjective: Still shocked by diagnosis Gets winded walking still  Objective: Vitals:   06/05/17 2124 06/06/17 0456 06/06/17 0746 06/06/17 1330  BP: (!) 130/95 (!) 137/106  121/81  Pulse: 88 (!) 59 70 68  Resp: 18 18 18 18   Temp: 98.5 F (36.9 C) 97.8 F (36.6 C)  97.9 F (36.6 C)  TempSrc: Oral   Oral  SpO2: 99% 100% 95% 96%  Weight:      Height:        Intake/Output Summary (Last 24 hours) at 06/06/2017 1352 Last data filed at 06/06/2017 1123 Gross per 24 hour  Intake 625 ml  Output -  Net 625 ml   Filed Weights   06/03/17 1709 06/04/17 1622  Weight: 53.5 kg (118 lb) 54.6  kg (120 lb 5.9 oz)    Examination:  General exam: in bed- on2L Respiratory system: diminished, increased work of breathing with simple tasks like speaking Cardiovascular system: rrr Gastrointestinal system: +BS, soft Central nervous system: awake, interactive Extremities: no LE edema     Data Reviewed: I have personally reviewed following labs and imaging studies  CBC: Recent Labs  Lab 06/03/17 1413 06/04/17 1633 06/06/17 0502  WBC 7.8 7.4 9.7  NEUTROABS 5.4  --    --   HGB 11.7* 10.4* 9.9*  HCT 34.4* 31.5* 29.7*  MCV 94.5 94.6 94.0  PLT 350 335 606   Basic Metabolic Panel: Recent Labs  Lab 06/03/17 1413 06/03/17 2320 06/04/17 0022 06/06/17 0502  NA 132*  --  135 136  K 3.7  --  3.8 3.8  CL 98*  --  110 102  CO2 23  --  20* 25  GLUCOSE 101*  --  102* 110*  BUN 6  --  5* <5*  CREATININE 0.66  --  0.58 0.59  CALCIUM 8.2*  --  7.2* 8.4*  MG  --  1.5* 1.4* 1.7  PHOS  --  2.1*  --   --    GFR: Estimated Creatinine Clearance: 79.5 mL/min (by C-G formula based on SCr of 0.59 mg/dL). Liver Function Tests: Recent Labs  Lab 06/03/17 1413 06/04/17 0022  AST 30 25  ALT 12* 11*  ALKPHOS 69 51  BILITOT 0.6 0.4  PROT 8.4* 7.1  ALBUMIN 2.6* 2.2*   No results for input(s): LIPASE, AMYLASE in the last 168 hours. No results for input(s): AMMONIA in the last 168 hours. Coagulation Profile: No results for input(s): INR, PROTIME in the last 168 hours. Cardiac Enzymes: No results for input(s): CKTOTAL, CKMB, CKMBINDEX, TROPONINI in the last 168 hours. BNP (last 3 results) No results for input(s): PROBNP in the last 8760 hours. HbA1C: No results for input(s): HGBA1C in the last 72 hours. CBG: No results for input(s): GLUCAP in the last 168 hours. Lipid Profile: No results for input(s): CHOL, HDL, LDLCALC, TRIG, CHOLHDL, LDLDIRECT in the last 72 hours. Thyroid Function Tests: No results for input(s): TSH, T4TOTAL, FREET4, T3FREE, THYROIDAB in the last 72 hours. Anemia Panel: No results for input(s): VITAMINB12, FOLATE, FERRITIN, TIBC, IRON, RETICCTPCT in the last 72 hours. Urine analysis:    Component Value Date/Time   COLORURINE YELLOW 12/03/2011 0550   APPEARANCEUR CLOUDY (A) 12/03/2011 0550   LABSPEC 1.021 12/03/2011 0550   PHURINE 6.0 12/03/2011 0550   GLUCOSEU NEGATIVE 12/03/2011 0550   HGBUR LARGE (A) 12/03/2011 0550   BILIRUBINUR NEGATIVE 12/03/2011 0550   KETONESUR NEGATIVE 12/03/2011 0550   PROTEINUR NEGATIVE 12/03/2011 0550    UROBILINOGEN 1.0 12/03/2011 0550   NITRITE NEGATIVE 12/03/2011 0550   LEUKOCYTESUR NEGATIVE 12/03/2011 0550     ) Recent Results (from the past 240 hour(s))  Respiratory Panel by PCR     Status: Abnormal   Collection Time: 06/03/17 11:31 PM  Result Value Ref Range Status   Adenovirus NOT DETECTED NOT DETECTED Final   Coronavirus 229E DETECTED (A) NOT DETECTED Final   Coronavirus HKU1 NOT DETECTED NOT DETECTED Final   Coronavirus NL63 NOT DETECTED NOT DETECTED Final   Coronavirus OC43 NOT DETECTED NOT DETECTED Final   Metapneumovirus NOT DETECTED NOT DETECTED Final   Rhinovirus / Enterovirus NOT DETECTED NOT DETECTED Final   Influenza A NOT DETECTED NOT DETECTED Final   Influenza B NOT DETECTED NOT DETECTED Final   Parainfluenza Virus 1  NOT DETECTED NOT DETECTED Final   Parainfluenza Virus 2 NOT DETECTED NOT DETECTED Final   Parainfluenza Virus 3 NOT DETECTED NOT DETECTED Final   Parainfluenza Virus 4 NOT DETECTED NOT DETECTED Final   Respiratory Syncytial Virus NOT DETECTED NOT DETECTED Final   Bordetella pertussis NOT DETECTED NOT DETECTED Final   Chlamydophila pneumoniae NOT DETECTED NOT DETECTED Final   Mycoplasma pneumoniae NOT DETECTED NOT DETECTED Final      Anti-infectives (From admission, onward)   Start     Dose/Rate Route Frequency Ordered Stop   06/05/17 1800  sulfamethoxazole-trimethoprim (BACTRIM) 240 mg in dextrose 5 % 250 mL IVPB     240 mg 265 mL/hr over 60 Minutes Intravenous Every 6 hours 06/05/17 1610     06/04/17 1800  azithromycin (ZITHROMAX) 500 mg in sodium chloride 0.9 % 250 mL IVPB  Status:  Discontinued     500 mg 250 mL/hr over 60 Minutes Intravenous Every 24 hours 06/03/17 1721 06/05/17 1553   06/04/17 1700  cefTRIAXone (ROCEPHIN) 1 g in sodium chloride 0.9 % 100 mL IVPB  Status:  Discontinued     1 g 200 mL/hr over 30 Minutes Intravenous Every 24 hours 06/03/17 1721 06/05/17 1553   06/03/17 1645  cefTRIAXone (ROCEPHIN) 1 g in sodium chloride  0.9 % 100 mL IVPB     1 g 200 mL/hr over 30 Minutes Intravenous  Once 06/03/17 1637 06/03/17 1741   06/03/17 1645  azithromycin (ZITHROMAX) 500 mg in sodium chloride 0.9 % 250 mL IVPB     500 mg 250 mL/hr over 60 Minutes Intravenous  Once 06/03/17 1637 06/03/17 2030       Radiology Studies: Ct Angio Chest Pe W Or Wo Contrast  Result Date: 06/04/2017 CLINICAL DATA:  Shortness of breath and chest pain EXAM: CT ANGIOGRAPHY CHEST WITH CONTRAST TECHNIQUE: Multidetector CT imaging of the chest was performed using the standard protocol during bolus administration of intravenous contrast. Multiplanar CT image reconstructions and MIPs were obtained to evaluate the vascular anatomy. CONTRAST:  163mL ISOVUE-370 IOPAMIDOL (ISOVUE-370) INJECTION 76% COMPARISON:  Chest radiograph June 03, 2017 FINDINGS: Cardiovascular: There is no demonstrable pulmonary embolus. There is no thoracic aortic aneurysm or dissection. The visualized great vessels appear normal. There is no pericardial effusion or pericardial thickening. Mediastinum/Nodes: Visualized thyroid appears unremarkable. There are scattered subcentimeter mediastinal lymph nodes. There is no adenopathy by size criteria on this study. There is a small hiatal hernia. Lungs/Pleura: There is multifocal airspace opacity, likely due to a combination of small airways obstructive disease and atelectasis. There is frank consolidation throughout portions of the right lower lobe. There is possibly a degree of superimposed alveolar edema. There are small pleural effusions bilaterally. Upper Abdomen: Visualized upper abdominal structures appear unremarkable. Musculoskeletal: There are no blastic or lytic bone lesions. No chest wall lesions are evident. Review of the MIP images confirms the above findings. IMPRESSION: 1. No demonstrable pulmonary embolus. No thoracic aortic aneurysm or dissection. 2. Widespread airspace opacity bilaterally. There is frank consolidation in  portions of the right lower lobe. Diffuse ground-glass opacity elsewhere is likely due to small airways obstructive disease and atelectasis. A degree of alveolar edema superimposed cannot be excluded. 3.  No adenopathy by size criteria. 4.  Small hiatal hernia. Electronically Signed   By: Lowella Grip III M.D.   On: 06/04/2017 15:42        Scheduled Meds: . enoxaparin (LOVENOX) injection  40 mg Subcutaneous Q24H  . famotidine  20 mg Oral BID  .  ipratropium-albuterol  3 mL Nebulization TID  . methylPREDNISolone (SOLU-MEDROL) injection  40 mg Intravenous Q12H  . pantoprazole  40 mg Oral Daily  . phosphorus  250 mg Oral Daily  . sodium chloride flush  3 mL Intravenous Q12H   Continuous Infusions: . sulfamethoxazole-trimethoprim Stopped (06/06/17 0732)     LOS: 1 day    Time spent: 25 min    Geradine Girt, DO [facility] Pager [phone]  If 7PM-7AM, please contact night-coverage [email or web address] Password [email or web address] 06/06/2017, 1:52 PM

## 2017-06-06 NOTE — Progress Notes (Signed)
  Echocardiogram 2D Echocardiogram has been performed.  Krystal Short 06/06/2017, 9:51 AM

## 2017-06-06 NOTE — Progress Notes (Addendum)
[facility]  Date of Admission:  06/03/2017              ASSESSMENT/PLAN  Newly diagnosed HIV positive likely AIDS with probable PCP and thrush who was being treated for pneumonia that was unresponsive to previous antibiotic therapy. Awaiting viral load and CD4 count. Started on Bactrim yesterday and on steroids. May need to adjust steroid dose. Husband aware of diagnosis and would like to be tested.   1. Continue Bactrim (day 2) with goal of therapy for 21 days. Will adjust prednisone as needed for PCP therapy. 2. Fluconazole for thrush. 3. Await CD4 and viral load prior to ART initiation.    Principal Problem:   CAP (community acquired pneumonia) Active Problems:   Pleurisy   Tachycardia   Facial dermatitis   Tobacco abuse   Acute hyponatremia   Community acquired pneumonia   . enoxaparin (LOVENOX) injection  40 mg Subcutaneous Q24H  . famotidine  20 mg Oral BID  . ipratropium-albuterol  3 mL Nebulization TID  . methylPREDNISolone (SOLU-MEDROL) injection  40 mg Intravenous Q12H  . pantoprazole  40 mg Oral Daily  . phosphorus  250 mg Oral Daily  . sodium chloride flush  3 mL Intravenous Q12H    SUBJECTIVE:  Doing okay today. She is aware of HIV and has discussed with her husband. Occasional shortness of breath. Decreased cough. No fevers, chills, nausea, vomiting, diarrhea, changes in vision or headaches.   No Known Allergies   Review of Systems: Review of Systems  Constitutional: Negative for chills, diaphoresis, fever and weight loss.  Respiratory: Negative for cough, shortness of breath and wheezing.   Cardiovascular: Negative for chest pain and leg swelling.  Gastrointestinal: Negative for abdominal pain, constipation, diarrhea, nausea and vomiting.      OBJECTIVE: Vitals:   06/05/17 2124 06/06/17 0456 06/06/17 0746 06/06/17 1330  BP: (!) 130/95 (!) 137/106  121/81  Pulse: 88 (!) 59 70 68  Resp: 18 18 18 18   Temp: 98.5 F  (36.9 C) 97.8 F (36.6 C)  97.9 F (36.6 C)  TempSrc: Oral   Oral  SpO2: 99% 100% 95% 96%  Weight:      Height:       Body mass index is 22.38 kg/m.  Physical Exam  Constitutional: She is oriented to person, place, and time. No distress.  Cardiovascular: Normal rate, regular rhythm, normal heart sounds and intact distal pulses.  Pulmonary/Chest: Effort normal and breath sounds normal. No stridor. No respiratory distress. She has no wheezes. She has no rales. She exhibits no tenderness.  Neurological: She is alert and oriented to person, place, and time.  Skin: Skin is warm and dry.  Psychiatric: She has a normal mood and affect. Her behavior is normal.    Lab Results Lab Results  Component Value Date   WBC 9.7 06/06/2017   HGB 9.9 (L) 06/06/2017   HCT 29.7 (L) 06/06/2017   MCV 94.0 06/06/2017   PLT 332 06/06/2017    Lab Results  Component Value Date   CREATININE 0.59 06/06/2017   BUN <5 (L) 06/06/2017   NA 136 06/06/2017   K 3.8 06/06/2017   CL 102 06/06/2017   CO2 25 06/06/2017    Lab Results  Component Value Date   ALT 11 (L) 06/04/2017   AST 25 06/04/2017   ALKPHOS 51 06/04/2017   BILITOT 0.4 06/04/2017     Microbiology: Recent Results (from the past 240 hour(s))  Respiratory Panel  by PCR     Status: Abnormal   Collection Time: 06/03/17 11:31 PM  Result Value Ref Range Status   Adenovirus NOT DETECTED NOT DETECTED Final   Coronavirus 229E DETECTED (A) NOT DETECTED Final   Coronavirus HKU1 NOT DETECTED NOT DETECTED Final   Coronavirus NL63 NOT DETECTED NOT DETECTED Final   Coronavirus OC43 NOT DETECTED NOT DETECTED Final   Metapneumovirus NOT DETECTED NOT DETECTED Final   Rhinovirus / Enterovirus NOT DETECTED NOT DETECTED Final   Influenza A NOT DETECTED NOT DETECTED Final   Influenza B NOT DETECTED NOT DETECTED Final   Parainfluenza Virus 1 NOT DETECTED NOT DETECTED Final   Parainfluenza Virus 2 NOT DETECTED NOT DETECTED Final   Parainfluenza Virus 3  NOT DETECTED NOT DETECTED Final   Parainfluenza Virus 4 NOT DETECTED NOT DETECTED Final   Respiratory Syncytial Virus NOT DETECTED NOT DETECTED Final   Bordetella pertussis NOT DETECTED NOT DETECTED Final   Chlamydophila pneumoniae NOT DETECTED NOT DETECTED Final   Mycoplasma pneumoniae NOT DETECTED NOT DETECTED Final     Terri Piedra, NP [facility] [phone] Pager  06/06/2017  1:50 PM

## 2017-06-07 ENCOUNTER — Encounter (HOSPITAL_COMMUNITY): Payer: Self-pay | Admitting: General Practice

## 2017-06-07 DIAGNOSIS — Z21 Asymptomatic human immunodeficiency virus [HIV] infection status: Secondary | ICD-10-CM

## 2017-06-07 DIAGNOSIS — B370 Candidal stomatitis: Secondary | ICD-10-CM

## 2017-06-07 DIAGNOSIS — B3781 Candidal esophagitis: Secondary | ICD-10-CM

## 2017-06-07 DIAGNOSIS — B59 Pneumocystosis: Secondary | ICD-10-CM

## 2017-06-07 DIAGNOSIS — B20 Human immunodeficiency virus [HIV] disease: Secondary | ICD-10-CM

## 2017-06-07 LAB — BASIC METABOLIC PANEL
Anion gap: 11 (ref 5–15)
CHLORIDE: 101 mmol/L (ref 101–111)
CO2: 24 mmol/L (ref 22–32)
CREATININE: 0.74 mg/dL (ref 0.44–1.00)
Calcium: 8.8 mg/dL — ABNORMAL LOW (ref 8.9–10.3)
GFR calc Af Amer: >60 mL/min (ref >60)
GFR calc non Af Amer: >60 mL/min (ref >60)
Glucose, Bld: 95 mg/dL (ref 65–99)
Potassium: 3.2 mmol/L — ABNORMAL LOW (ref 3.5–5.1)
SODIUM: 136 mmol/L (ref 135–145)

## 2017-06-07 LAB — CBC WITH DIFFERENTIAL/PLATELET
Basophils Absolute: 0 10*3/uL (ref 0.0–0.1)
Basophils Relative: 0 %
EOS ABS: 0 10*3/uL (ref 0.0–0.7)
EOS PCT: 0 %
HCT: 33.5 % — ABNORMAL LOW (ref 36.0–46.0)
HEMOGLOBIN: 11.4 g/dL — AB (ref 12.0–15.0)
LYMPHS ABS: 1.5 10*3/uL (ref 0.7–4.0)
Lymphocytes Relative: 17 %
MCH: 31.7 pg (ref 26.0–34.0)
MCHC: 34 g/dL (ref 30.0–36.0)
MCV: 93.1 fL (ref 78.0–100.0)
MONO ABS: 0.5 10*3/uL (ref 0.1–1.0)
MONOS PCT: 6 %
Neutro Abs: 6.7 10*3/uL (ref 1.7–7.7)
Neutrophils Relative %: 77 %
Platelets: 328 10*3/uL (ref 150–400)
RBC: 3.6 MIL/uL — ABNORMAL LOW (ref 3.87–5.11)
RDW: 13 % (ref 11.5–15.5)
WBC: 8.7 10*3/uL (ref 4.0–10.5)

## 2017-06-07 LAB — MAGNESIUM: MAGNESIUM: 1.5 mg/dL — AB (ref 1.7–2.4)

## 2017-06-07 MED ORDER — GUAIFENESIN ER 600 MG PO TB12
600.0000 mg | ORAL_TABLET | Freq: Two times a day (BID) | ORAL | Status: AC
  Administered 2017-06-07 (×2): 600 mg via ORAL
  Filled 2017-06-07 (×2): qty 1

## 2017-06-07 MED ORDER — MAGNESIUM OXIDE 400 (241.3 MG) MG PO TABS
400.0000 mg | ORAL_TABLET | Freq: Once | ORAL | Status: AC
Start: 2017-06-07 — End: 2017-06-07
  Administered 2017-06-07: 400 mg via ORAL
  Filled 2017-06-07: qty 1

## 2017-06-07 MED ORDER — POTASSIUM CHLORIDE CRYS ER 20 MEQ PO TBCR
40.0000 meq | EXTENDED_RELEASE_TABLET | Freq: Once | ORAL | Status: AC
  Administered 2017-06-07: 40 meq via ORAL
  Filled 2017-06-07: qty 2

## 2017-06-07 MED ORDER — BICTEGRAVIR-EMTRICITAB-TENOFOV 50-200-25 MG PO TABS
1.0000 | ORAL_TABLET | Freq: Every day | ORAL | Status: DC
  Administered 2017-06-07 – 2017-06-11 (×5): 1 via ORAL
  Filled 2017-06-07 (×5): qty 1

## 2017-06-07 NOTE — Progress Notes (Signed)
PROGRESS NOTE    Krystal Short  EVO:[id number] DOB: Jan 22, 1986 DOA: 06/03/2017 PCP: Patient, No Pcp Per   Outpatient Specialists:     Brief Narrative:  Krystal Short is a 32 y.o. female with medical history significant for tobacco abuse otherwise no chronic medical problems.  Patient was diagnosed with influenza on 4/5 and was treated with Tamiflu.  About 2 weeks later patient had not had any improvement in her symptoms return to urgent care and was diagnosed with right lower lobe pneumonia and treated with amoxicillin and steroids.  She has subsequently completed those antibiotics.  Again she has had no improvement in her symptoms and is reporting increased cough, intermittent chest discomfort.  She reports productive sputum that is anywhere from clear to yellow.  She reports subjective fevers.  No nausea vomiting and diarrhea but she has had a poor appetite.  She typically smokes 1 cigar a day but has not smoked in over a month.  In the ER repeat chest x-ray here concerning for either bilateral atelectasis versus evolving bilateral lower lobe pneumonia.  Patient did not have leukocytosis and was not hypoxemic.  She was observed with wet sounding paroxysmal coughing and persistent tachycardia and tachypnea.  She will be treated empirically for community-acquired pneumonia.      Assessment & Plan:   Principal Problem:   CAP (community acquired pneumonia) Active Problems:   Pleurisy   Tachycardia   Facial dermatitis   Tobacco abuse   Acute hyponatremia   Community acquired pneumonia   Thrush of mouth and esophagus ([place])   AIDS ([place])   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 (HCC)  HIV + -appreciate Dr. Algis Downs consult Per ID: Plan: 1. Check CD4 and HIV RNA and genotype 2. Cont bactrim 3. Continue steroids 4. Check RPR, gc/chlamydia 5. Check hepatitis panel 6. Retroviral meds per ID 7. Sputum for PCP DFA.   CAP (community acquired pneumonia)/  Pleurisy -Patient presents with persistent URI symptoms after outpatient treatment for influenza as well as for right pneumonia (amoxicillin) with apparent failure of outpatient antibiotic treatment -NP swab with virus -HIV positive, see above -PRN pain meds -IV Solu-Medrol 40 mg every 12 hours for pneumonia symptoms -Robitussin PRN for cough and as mucolytic -Albuterol neb every 4 hours PRN to assist with paroxysmal coughing -Protonix and Pepcid while on scheduled NSAID    Tachycardia -resolved -CTA negative     Acute hyponatremia -Likely secondary to dehydration -improved with IVF    Facial dermatitis  -Patient reports rash as described above ongoing for greater than 1 year -Continue to follow-May need dermatology follow-up as outpatient -HIV positive    Tobacco abuse -Admits to smoking 1 cigar daily up until 2 months ago -Tobacco cessation counseling  Hypomagnesemia/hypophosphatemia -replace     DVT prophylaxis:  Lovenox   Code Status: Full Code   Family Communication:   Disposition Plan:     Consultants:   ID  PCCM     Subjective: Still short of breath with exertion. Able to move across the room.  Objective: Vitals:   06/07/17 0434 06/07/17 0927 06/07/17 1429 06/07/17 1601  BP: (!) 125/100   105/75  Pulse: 68 77 83 100  Resp: 18 18 16 18   Temp: 98 F (36.7 C)   98.1 F (36.7 C)  TempSrc: Oral   Oral  SpO2: 100% 95% 95% 96%  Weight:      Height:        Intake/Output Summary (Last 24 hours) at 06/07/2017  [place] filed at 06/07/2017 1036 Gross per 24 hour  Intake 265 ml  Output -  Net 265 ml   Filed Weights   06/03/17 1709 06/04/17 1622  Weight: 53.5 kg (118 lb) 54.6 kg (120 lb 5.9 oz)    Examination:  General exam: in bed- on 2L Respiratory system: increased work ofbreathing with talking Cardiovascular system: rrr Gastrointestinal system: +BS, soft Central nervous system: alert Extremities: moves all 4  ext     Data Reviewed: I have personally reviewed following labs and imaging studies  CBC: Recent Labs  Lab 06/03/17 1413 06/04/17 1633 06/06/17 0502 06/07/17 1022  WBC 7.8 7.4 9.7 8.7  NEUTROABS 5.4  --   --  6.7  HGB 11.7* 10.4* 9.9* 11.4*  HCT 34.4* 31.5* 29.7* 33.5*  MCV 94.5 94.6 94.0 93.1  PLT 350 335 332 161   Basic Metabolic Panel: Recent Labs  Lab 06/03/17 1413 06/03/17 2320 06/04/17 0022 06/06/17 0502 06/07/17 1022  NA 132*  --  135 136 136  K 3.7  --  3.8 3.8 3.2*  CL 98*  --  110 102 101  CO2 23  --  20* 25 24  GLUCOSE 101*  --  102* 110* 95  BUN 6  --  5* <5* <5*  CREATININE 0.66  --  0.58 0.59 0.74  CALCIUM 8.2*  --  7.2* 8.4* 8.8*  MG  --  1.5* 1.4* 1.7 1.5*  PHOS  --  2.1*  --   --   --    GFR: Estimated Creatinine Clearance: 79.5 mL/min (by C-G formula based on SCr of 0.74 mg/dL). Liver Function Tests: Recent Labs  Lab 06/03/17 1413 06/04/17 0022  AST 30 25  ALT 12* 11*  ALKPHOS 69 51  BILITOT 0.6 0.4  PROT 8.4* 7.1  ALBUMIN 2.6* 2.2*   No results for input(s): LIPASE, AMYLASE in the last 168 hours. No results for input(s): AMMONIA in the last 168 hours. Coagulation Profile: No results for input(s): INR, PROTIME in the last 168 hours. Cardiac Enzymes: No results for input(s): CKTOTAL, CKMB, CKMBINDEX, TROPONINI in the last 168 hours. BNP (last 3 results) No results for input(s): PROBNP in the last 8760 hours. HbA1C: No results for input(s): HGBA1C in the last 72 hours. CBG: No results for input(s): GLUCAP in the last 168 hours. Lipid Profile: No results for input(s): CHOL, HDL, LDLCALC, TRIG, CHOLHDL, LDLDIRECT in the last 72 hours. Thyroid Function Tests: No results for input(s): TSH, T4TOTAL, FREET4, T3FREE, THYROIDAB in the last 72 hours. Anemia Panel: No results for input(s): VITAMINB12, FOLATE, FERRITIN, TIBC, IRON, RETICCTPCT in the last 72 hours. Urine analysis:    Component Value Date/Time   COLORURINE YELLOW  12/03/2011 0550   APPEARANCEUR CLOUDY (A) 12/03/2011 0550   LABSPEC 1.021 12/03/2011 0550   PHURINE 6.0 12/03/2011 0550   GLUCOSEU NEGATIVE 12/03/2011 0550   HGBUR LARGE (A) 12/03/2011 0550   BILIRUBINUR NEGATIVE 12/03/2011 0550   KETONESUR NEGATIVE 12/03/2011 0550   PROTEINUR NEGATIVE 12/03/2011 0550   UROBILINOGEN 1.0 12/03/2011 0550   NITRITE NEGATIVE 12/03/2011 0550   LEUKOCYTESUR NEGATIVE 12/03/2011 0550     ) Recent Results (from the past 240 hour(s))  Respiratory Panel by PCR     Status: Abnormal   Collection Time: 06/03/17 11:31 PM  Result Value Ref Range Status   Adenovirus NOT DETECTED NOT DETECTED Final   Coronavirus 229E DETECTED (A) NOT DETECTED Final   Coronavirus HKU1 NOT DETECTED NOT DETECTED Final  Coronavirus NL63 NOT DETECTED NOT DETECTED Final   Coronavirus OC43 NOT DETECTED NOT DETECTED Final   Metapneumovirus NOT DETECTED NOT DETECTED Final   Rhinovirus / Enterovirus NOT DETECTED NOT DETECTED Final   Influenza A NOT DETECTED NOT DETECTED Final   Influenza B NOT DETECTED NOT DETECTED Final   Parainfluenza Virus 1 NOT DETECTED NOT DETECTED Final   Parainfluenza Virus 2 NOT DETECTED NOT DETECTED Final   Parainfluenza Virus 3 NOT DETECTED NOT DETECTED Final   Parainfluenza Virus 4 NOT DETECTED NOT DETECTED Final   Respiratory Syncytial Virus NOT DETECTED NOT DETECTED Final   Bordetella pertussis NOT DETECTED NOT DETECTED Final   Chlamydophila pneumoniae NOT DETECTED NOT DETECTED Final   Mycoplasma pneumoniae NOT DETECTED NOT DETECTED Final      Anti-infectives (From admission, onward)   Start     Dose/Rate Route Frequency Ordered Stop   06/07/17 1400  bictegravir-emtricitabine-tenofovir AF (BIKTARVY) 50-200-25 MG per tablet 1 tablet     1 tablet Oral Daily 06/07/17 1303     06/06/17 1500  fluconazole (DIFLUCAN) tablet 100 mg     100 mg Oral Daily 06/06/17 1423 06/13/17 0959   06/05/17 1800  sulfamethoxazole-trimethoprim (BACTRIM) 240 mg in dextrose 5 %  250 mL IVPB     240 mg 265 mL/hr over 60 Minutes Intravenous Every 6 hours 06/05/17 1610     06/04/17 1800  azithromycin (ZITHROMAX) 500 mg in sodium chloride 0.9 % 250 mL IVPB  Status:  Discontinued     500 mg 250 mL/hr over 60 Minutes Intravenous Every 24 hours 06/03/17 1721 06/05/17 1553   06/04/17 1700  cefTRIAXone (ROCEPHIN) 1 g in sodium chloride 0.9 % 100 mL IVPB  Status:  Discontinued     1 g 200 mL/hr over 30 Minutes Intravenous Every 24 hours 06/03/17 1721 06/05/17 1553   06/03/17 1645  cefTRIAXone (ROCEPHIN) 1 g in sodium chloride 0.9 % 100 mL IVPB     1 g 200 mL/hr over 30 Minutes Intravenous  Once 06/03/17 1637 06/03/17 1741   06/03/17 1645  azithromycin (ZITHROMAX) 500 mg in sodium chloride 0.9 % 250 mL IVPB     500 mg 250 mL/hr over 60 Minutes Intravenous  Once 06/03/17 1637 06/03/17 2030       Radiology Studies: No results found.      Scheduled Meds: . bictegravir-emtricitabine-tenofovir AF  1 tablet Oral Daily  . enoxaparin (LOVENOX) injection  40 mg Subcutaneous Q24H  . famotidine  20 mg Oral BID  . fluconazole  100 mg Oral Daily  . guaiFENesin  600 mg Oral BID  . ipratropium-albuterol  3 mL Nebulization TID  . pantoprazole  40 mg Oral Daily  . phosphorus  250 mg Oral Daily  . predniSONE  40 mg Oral BID WC   Followed by  . [START ON 06/09/2017] predniSONE  40 mg Oral Q breakfast   Followed by  . [START ON 06/14/2017] predniSONE  20 mg Oral Q breakfast  . sodium chloride flush  3 mL Intravenous Q12H   Continuous Infusions: . sulfamethoxazole-trimethoprim 240 mg (06/07/17 1704)     LOS: 2 days    Time spent: 25 min    Elwin Mocha, DO [facility] Pager AMION  If 7PM-7AM, please contact night-coverage [email or web address] Password [facility] 06/07/2017, 6:15 PM

## 2017-06-07 NOTE — Progress Notes (Signed)
[facility] for Infectious Disease  Date of Admission:  06/03/2017              ASSESSMENT/PLAN  Krystal Short has newly diagnosed AIDS with a CD4 count of 10 and probable PCP Pneumonia. She remains on Bactrim and prednisone. She is stable and appears to be improving. Tolerating the antibiotics well with no adverse side effects. Still requires nasal cannula for now. HIV viral load and genotype remain pending.   1. Continue Bactrim with prednisone taper. 2. Start Biktarvy.  3. Continue Fluconazole for thrush - if no improvements may need a higher dosage.  4. We will continue to follow.   Principal Problem:   CAP (community acquired pneumonia) Active Problems:   Pleurisy   Tachycardia   Facial dermatitis   Tobacco abuse   Acute hyponatremia   Community acquired pneumonia   Thrush of mouth and esophagus ([place])   . enoxaparin (LOVENOX) injection  40 mg Subcutaneous Q24H  . famotidine  20 mg Oral BID  . fluconazole  100 mg Oral Daily  . guaiFENesin  600 mg Oral BID  . ipratropium-albuterol  3 mL Nebulization TID  . pantoprazole  40 mg Oral Daily  . phosphorus  250 mg Oral Daily  . predniSONE  40 mg Oral BID WC   Followed by  . [START ON 06/09/2017] predniSONE  40 mg Oral Q breakfast   Followed by  . [START ON 06/14/2017] predniSONE  20 mg Oral Q breakfast  . sodium chloride flush  3 mL Intravenous Q12H    SUBJECTIVE:  Feeling better today. Continues to have episodes of shortness of breath. Tolerating antibiotics with no adverse side effects. Has been up and walking around a little.   No Known Allergies   Review of Systems: Review of Systems  Constitutional: Negative for chills and fever.  Eyes: Negative for blurred vision, double vision, photophobia, pain, discharge and redness.  Respiratory: Positive for shortness of breath (occasional). Negative for cough and wheezing.   Cardiovascular: Negative for chest pain and leg swelling.  Gastrointestinal: Negative for  abdominal pain, constipation, diarrhea, nausea and vomiting.  Skin: Positive for rash.      OBJECTIVE: Vitals:   06/06/17 2059 06/06/17 2113 06/07/17 0434 06/07/17 0927  BP:  (!) 132/97 (!) 125/100   Pulse: 91 (!) 101 68 77  Resp: 18 18 18 18   Temp:  98 F (36.7 C) 98 F (36.7 C)   TempSrc:  Oral Oral   SpO2: 99% 93% 100% 95%  Weight:      Height:       Body mass index is 22.38 kg/m.  Physical Exam  Constitutional: She is cooperative. She appears ill. Nasal cannula in place.  Appears thin; pleasant.   Cardiovascular: Normal rate, regular rhythm, normal heart sounds and intact distal pulses. Exam reveals no gallop and no friction rub.  No murmur heard. Pulmonary/Chest: Effort normal and breath sounds normal. No stridor. No respiratory distress. She has no wheezes. She has no rales. She exhibits no tenderness.  Abdominal: Soft. Bowel sounds are normal. She exhibits no distension. There is no tenderness.  Neurological: She is alert.  Skin: Skin is warm and dry.  Psychiatric: She has a normal mood and affect. Her behavior is normal.    Lab Results Lab Results  Component Value Date   WBC 8.7 06/07/2017   HGB 11.4 (L) 06/07/2017   HCT 33.5 (L) 06/07/2017   MCV 93.1 06/07/2017   PLT 328 06/07/2017  Lab Results  Component Value Date   CREATININE 0.59 06/06/2017   BUN <5 (L) 06/06/2017   NA 136 06/06/2017   K 3.8 06/06/2017   CL 102 06/06/2017   CO2 25 06/06/2017    Lab Results  Component Value Date   ALT 11 (L) 06/04/2017   AST 25 06/04/2017   ALKPHOS 51 06/04/2017   BILITOT 0.4 06/04/2017     Microbiology: Recent Results (from the past 240 hour(s))  Respiratory Panel by PCR     Status: Abnormal   Collection Time: 06/03/17 11:31 PM  Result Value Ref Range Status   Adenovirus NOT DETECTED NOT DETECTED Final   Coronavirus 229E DETECTED (A) NOT DETECTED Final   Coronavirus HKU1 NOT DETECTED NOT DETECTED Final   Coronavirus NL63 NOT DETECTED NOT DETECTED  Final   Coronavirus OC43 NOT DETECTED NOT DETECTED Final   Metapneumovirus NOT DETECTED NOT DETECTED Final   Rhinovirus / Enterovirus NOT DETECTED NOT DETECTED Final   Influenza A NOT DETECTED NOT DETECTED Final   Influenza B NOT DETECTED NOT DETECTED Final   Parainfluenza Virus 1 NOT DETECTED NOT DETECTED Final   Parainfluenza Virus 2 NOT DETECTED NOT DETECTED Final   Parainfluenza Virus 3 NOT DETECTED NOT DETECTED Final   Parainfluenza Virus 4 NOT DETECTED NOT DETECTED Final   Respiratory Syncytial Virus NOT DETECTED NOT DETECTED Final   Bordetella pertussis NOT DETECTED NOT DETECTED Final   Chlamydophila pneumoniae NOT DETECTED NOT DETECTED Final   Mycoplasma pneumoniae NOT DETECTED NOT DETECTED Final     Terri Piedra, NP [place] [phone] Pager  06/07/2017  11:08 AM

## 2017-06-08 LAB — BASIC METABOLIC PANEL
ANION GAP: 7 (ref 5–15)
BUN: 6 mg/dL (ref 6–20)
CO2: 23 mmol/L (ref 22–32)
Calcium: 8.7 mg/dL — ABNORMAL LOW (ref 8.9–10.3)
Chloride: 102 mmol/L (ref 101–111)
Creatinine, Ser: 0.73 mg/dL (ref 0.44–1.00)
GFR calc Af Amer: >60 mL/min (ref >60)
GLUCOSE: 113 mg/dL — AB (ref 65–99)
POTASSIUM: 4.5 mmol/L (ref 3.5–5.1)
Sodium: 132 mmol/L — ABNORMAL LOW (ref 135–145)

## 2017-06-08 LAB — GC/CHLAMYDIA PROBE AMP (~~LOC~~) NOT AT ARMC
Chlamydia: NEGATIVE
NEISSERIA GONORRHEA: NEGATIVE

## 2017-06-08 LAB — CBC WITH DIFFERENTIAL/PLATELET
BASOS ABS: 0 10*3/uL (ref 0.0–0.1)
Basophils Relative: 0 %
Eosinophils Absolute: 0 10*3/uL (ref 0.0–0.7)
Eosinophils Relative: 0 %
HEMATOCRIT: 30.5 % — AB (ref 36.0–46.0)
HEMOGLOBIN: 10.4 g/dL — AB (ref 12.0–15.0)
LYMPHS PCT: 8 %
Lymphs Abs: 0.9 10*3/uL (ref 0.7–4.0)
MCH: 31.2 pg (ref 26.0–34.0)
MCHC: 34.1 g/dL (ref 30.0–36.0)
MCV: 91.6 fL (ref 78.0–100.0)
Monocytes Absolute: 0.6 10*3/uL (ref 0.1–1.0)
Monocytes Relative: 5 %
NEUTROS ABS: 9.7 10*3/uL — AB (ref 1.7–7.7)
NEUTROS PCT: 87 %
Platelets: 330 10*3/uL (ref 150–400)
RBC: 3.33 MIL/uL — ABNORMAL LOW (ref 3.87–5.11)
RDW: 13 % (ref 11.5–15.5)
WBC: 11.2 10*3/uL — AB (ref 4.0–10.5)

## 2017-06-08 LAB — MAGNESIUM: Magnesium: 1.7 mg/dL (ref 1.7–2.4)

## 2017-06-08 LAB — PHOSPHORUS: Phosphorus: 3.2 mg/dL (ref 2.5–4.6)

## 2017-06-08 MED ORDER — SULFAMETHOXAZOLE-TRIMETHOPRIM 800-160 MG PO TABS
2.0000 | ORAL_TABLET | Freq: Three times a day (TID) | ORAL | Status: DC
  Administered 2017-06-08 – 2017-06-11 (×9): 2 via ORAL
  Filled 2017-06-08 (×9): qty 2

## 2017-06-08 MED ORDER — PREDNISONE 20 MG PO TABS: ORAL_TABLET | ORAL | 0 refills | Status: AC

## 2017-06-08 MED ORDER — BICTEGRAVIR-EMTRICITAB-TENOFOV 50-200-25 MG PO TABS: 1.0000 | ORAL_TABLET | Freq: Every day | ORAL | 2 refills | Status: DC

## 2017-06-08 MED ORDER — SULFAMETHOXAZOLE-TRIMETHOPRIM 800-160 MG PO TABS: 2.0000 | ORAL_TABLET | Freq: Three times a day (TID) | ORAL | 0 refills | Status: AC

## 2017-06-08 MED FILL — BIKTARVY 50-200-25 MG TABS: 50-200-25 | 30 days supply | Qty: 30 | Fill #0

## 2017-06-08 MED FILL — predniSONE 20 MG TABS: 20 | 16 days supply | Qty: 21 | Fill #0

## 2017-06-08 MED FILL — SULFAMETHOXAZOLE-TMP DS TAB: 800-160 | 17 days supply | Qty: 102 | Fill #0

## 2017-06-08 NOTE — Progress Notes (Addendum)
Pharmacy Consult  Consulted by ID team to ensure patient had discharge prescriptions. Delivered a 30 day supply of Biktarvy and remainder of prednisone and Bactrim prescriptions to patient bedside. Counseled patient on how to take medications and she expressed understanding.   Jimmy Footman, PharmD, BCPS PGY2 Infectious Diseases Pharmacy Resident Pager: [phone]

## 2017-06-08 NOTE — Progress Notes (Signed)
[facility] for Infectious Disease  Date of Admission:  06/03/2017            ASSESSMENT/PLAN  Krystal Short is newly diagnosed HIV and presumed PCP given initial CD4 count at 10. Viral load and genotype remain pending. Started on [place] yesterday and tolerating it well. Continues to receive treatment for PCP with Bactrim and prednisone. She is tolerating these medications with no adverse side effects. Now off nasal cannula at rest with need following activity. Will arrange for patient to obtain 30 day supply of Biktarvy prior to discharge and follow up in ID clinic.  1. Continue Bactrim and Prednisone for presumed PCP with goal therapy of 21 days.  2. Continue Biktarvy for ART.  3. Continue to wean O2.   Dr. Megan Salon is available this weekend as needed. We will follow up on Monday Principal Problem:   CAP (community acquired pneumonia) Active Problems:   Pleurisy   Tachycardia   Facial dermatitis   Tobacco abuse   Acute hyponatremia   Community acquired pneumonia   Thrush of mouth and esophagus ([place])   AIDS ([place])   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 ([place])   . bictegravir-emtricitabine-tenofovir AF  1 tablet Oral Daily  . enoxaparin (LOVENOX) injection  40 mg Subcutaneous Q24H  . famotidine  20 mg Oral BID  . fluconazole  100 mg Oral Daily  . pantoprazole  40 mg Oral Daily  . phosphorus  250 mg Oral Daily  . predniSONE  40 mg Oral BID WC   Followed by  . [START ON 06/09/2017] predniSONE  40 mg Oral Q breakfast   Followed by  . [START ON 06/14/2017] predniSONE  20 mg Oral Q breakfast  . sodium chloride flush  3 mL Intravenous Q12H    SUBJECTIVE:  Feeling better today. Activity levels continue to improve. Off oxygen last night, but continues to need it following going to the bathroom and movement. Denies fevers, chills, night sweats, nausea, vomiting or diarrhea. Denies adverse side effects of Biktarvy that was started yesterday.   No Known  Allergies   Review of Systems: Review of Systems  Constitutional: Positive for malaise/fatigue. Negative for chills, diaphoresis and fever.  Respiratory: Positive for cough and shortness of breath. Negative for hemoptysis, sputum production and wheezing.   Cardiovascular: Negative for chest pain and leg swelling.  Gastrointestinal: Negative for abdominal pain, constipation, diarrhea, nausea and vomiting.      OBJECTIVE: Vitals:   06/07/17 1601 06/07/17 1937 06/07/17 2107 06/08/17 0506  BP: 105/75  119/67 100/66  Pulse: 100  (!) 104 76  Resp: 18  16 (!) 24  Temp: 98.1 F (36.7 C)  98 F (36.7 C) 97.8 F (36.6 C)  TempSrc: Oral  Oral Oral  SpO2: 96% 95% 100% 100%  Weight:      Height:       Body mass index is 22.38 kg/m.  Physical Exam  Constitutional: No distress.  Appears thin; pleasant.   Cardiovascular: Normal rate, regular rhythm, normal heart sounds and intact distal pulses. Exam reveals no gallop and no friction rub.  No murmur heard. Pulmonary/Chest: Effort normal and breath sounds normal. No stridor. No respiratory distress. She has no wheezes. She has no rales. She exhibits no tenderness.  Abdominal: Soft. Bowel sounds are normal. She exhibits no distension and no mass. There is no tenderness. There is no rebound and no guarding. No hernia.  Neurological: She is alert.  Skin: Skin is warm and  dry.  Psychiatric: She has a normal mood and affect. Her behavior is normal.    Lab Results Lab Results  Component Value Date   WBC 11.2 (H) 06/08/2017   HGB 10.4 (L) 06/08/2017   HCT 30.5 (L) 06/08/2017   MCV 91.6 06/08/2017   PLT 330 06/08/2017    Lab Results  Component Value Date   CREATININE 0.73 06/08/2017   BUN 6 06/08/2017   NA 132 (L) 06/08/2017   K 4.5 06/08/2017   CL 102 06/08/2017   CO2 23 06/08/2017    Lab Results  Component Value Date   ALT 11 (L) 06/04/2017   AST 25 06/04/2017   ALKPHOS 51 06/04/2017   BILITOT 0.4 06/04/2017      Microbiology: Recent Results (from the past 240 hour(s))  Respiratory Panel by PCR     Status: Abnormal   Collection Time: 06/03/17 11:31 PM  Result Value Ref Range Status   Adenovirus NOT DETECTED NOT DETECTED Final   Coronavirus 229E DETECTED (A) NOT DETECTED Final   Coronavirus HKU1 NOT DETECTED NOT DETECTED Final   Coronavirus NL63 NOT DETECTED NOT DETECTED Final   Coronavirus OC43 NOT DETECTED NOT DETECTED Final   Metapneumovirus NOT DETECTED NOT DETECTED Final   Rhinovirus / Enterovirus NOT DETECTED NOT DETECTED Final   Influenza A NOT DETECTED NOT DETECTED Final   Influenza B NOT DETECTED NOT DETECTED Final   Parainfluenza Virus 1 NOT DETECTED NOT DETECTED Final   Parainfluenza Virus 2 NOT DETECTED NOT DETECTED Final   Parainfluenza Virus 3 NOT DETECTED NOT DETECTED Final   Parainfluenza Virus 4 NOT DETECTED NOT DETECTED Final   Respiratory Syncytial Virus NOT DETECTED NOT DETECTED Final   Bordetella pertussis NOT DETECTED NOT DETECTED Final   Chlamydophila pneumoniae NOT DETECTED NOT DETECTED Final   Mycoplasma pneumoniae NOT DETECTED NOT DETECTED Final     Terri Piedra, NP [place] [facility] Infectious Disease [facility] [phone] Pager  06/08/2017  8:54 AM

## 2017-06-08 NOTE — Progress Notes (Signed)
PROGRESS NOTE    Krystal Short  OXB:[id number] DOB: 09/08/1985 DOA: 06/03/2017 PCP: Patient, No Pcp Per   Outpatient Specialists:     Brief Narrative:  Krystal Short is a 32 y.o. female with medical history significant for tobacco abuse otherwise no chronic medical problems.  Patient was diagnosed with influenza on 4/5 and was treated with Tamiflu.  About 2 weeks later patient had not had any improvement in her symptoms return to urgent care and was diagnosed with right lower lobe pneumonia and treated with amoxicillin and steroids.  She has subsequently completed those antibiotics.  Again she has had no improvement in her symptoms and is reporting increased cough, intermittent chest discomfort.  She reports productive sputum that is anywhere from clear to yellow.  She reports subjective fevers.  No nausea vomiting and diarrhea but she has had a poor appetite.  She typically smokes 1 cigar a day but has not smoked in over a month.  In the ER repeat chest x-ray here concerning for either bilateral atelectasis versus evolving bilateral lower lobe pneumonia.  Patient did not have leukocytosis and was not hypoxemic.  She was observed with wet sounding paroxysmal coughing and persistent tachycardia and tachypnea.  She is being seen by infectious disease and suspected PCP pneumonia. Initiated on Bactrim    Assessment & Plan:   Principal Problem:   CAP (community acquired pneumonia) Active Problems:   Pleurisy   Tachycardia   Facial dermatitis   Tobacco abuse   Acute hyponatremia   Community acquired pneumonia   Thrush of mouth and esophagus ([place])   AIDS ([place])   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 ([place])  1. CAP (community acquired pneumonia)/ PCP pneumonia: -patient on Bactrimand prednisone. Also on Diflucan. Symptoms seem to be improving. Oxygen demand has decreased. Continue care per infectious disease  2 HIV +/AIDS:  -appreciate Dr. Algis Downs consult Per ID: -[id number]  count of 10 -Viral load still pending. -Patient initiated on BIKTARVY today -Fluconazole added for possible oral trash   3 Sinus Tachycardia -resolved -CTA negative    4. Acute hyponatremia -Likely secondary to dehydration -improved with IVF   5. Facial dermatitis  -Patient reports rash as described above ongoing for greater than 1 year -Most likely due to his HIV 8 -Continue to follow-May need dermatology follow-up as outpatient   6. Tobacco abuse -Admits to smoking 1 cigar daily up until 2 months ago -Tobacco cessation counseling  7. Hypomagnesemia/hypophosphatemia -replaced  DVT prophylaxis:  Lovenox   Code Status: Full Code   Family Communication:   Disposition Plan:   -Home  Consultants:   ID  PCCM     Subjective: Still Short of breath with exertion. Able to move across the room.  Objective: Vitals:   06/07/17 1601 06/07/17 1937 06/07/17 2107 06/08/17 0506  BP: 105/75  119/67 100/66  Pulse: 100  (!) 104 76  Resp: 18  16 (!) 24  Temp: 98.1 F (36.7 C)  98 F (36.7 C) 97.8 F (36.6 C)  TempSrc: Oral  Oral Oral  SpO2: 96% 95% 100% 100%  Weight:      Height:        Intake/Output Summary (Last 24 hours) at 06/08/2017 0920 Last data filed at 06/07/2017 1036 Gross per 24 hour  Intake 265 ml  Output -  Net 265 ml   Filed Weights   06/03/17 1709 06/04/17 1622  Weight: 53.5 kg (118 lb) 54.6 kg (120 lb 5.9 oz)    Examination:  General exam: Stable, NAD Respiratory system: increased work ofbreathing with talking Cardiovascular system: rrr Gastrointestinal system: +BS, soft Central nervous system: alert Extremities: moves all 4 ext     Data Reviewed: I have personally reviewed following labs and imaging studies  CBC: Recent Labs  Lab 06/03/17 1413 06/04/17 1633 06/06/17 0502 06/07/17 1022 06/08/17 0402  WBC 7.8 7.4 9.7 8.7 11.2*  NEUTROABS 5.4  --   --  6.7 9.7*  HGB 11.7* 10.4* 9.9* 11.4* 10.4*  HCT 34.4* 31.5* 29.7*  33.5* 30.5*  MCV 94.5 94.6 94.0 93.1 91.6  PLT 350 335 332 328 213   Basic Metabolic Panel: Recent Labs  Lab 06/03/17 1413 06/03/17 2320 06/04/17 0022 06/06/17 0502 06/07/17 1022 06/08/17 0402  NA 132*  --  135 136 136 132*  K 3.7  --  3.8 3.8 3.2* 4.5  CL 98*  --  110 102 101 102  CO2 23  --  20* 25 24 23   GLUCOSE 101*  --  102* 110* 95 113*  BUN 6  --  5* <5* <5* 6  CREATININE 0.66  --  0.58 0.59 0.74 0.73  CALCIUM 8.2*  --  7.2* 8.4* 8.8* 8.7*  MG  --  1.5* 1.4* 1.7 1.5* 1.7  PHOS  --  2.1*  --   --   --  3.2   GFR: Estimated Creatinine Clearance: 79.5 mL/min (by C-G formula based on SCr of 0.73 mg/dL). Liver Function Tests: Recent Labs  Lab 06/03/17 1413 06/04/17 0022  AST 30 25  ALT 12* 11*  ALKPHOS 69 51  BILITOT 0.6 0.4  PROT 8.4* 7.1  ALBUMIN 2.6* 2.2*   No results for input(s): LIPASE, AMYLASE in the last 168 hours. No results for input(s): AMMONIA in the last 168 hours. Coagulation Profile: No results for input(s): INR, PROTIME in the last 168 hours. Cardiac Enzymes: No results for input(s): CKTOTAL, CKMB, CKMBINDEX, TROPONINI in the last 168 hours. BNP (last 3 results) No results for input(s): PROBNP in the last 8760 hours. HbA1C: No results for input(s): HGBA1C in the last 72 hours. CBG: No results for input(s): GLUCAP in the last 168 hours. Lipid Profile: No results for input(s): CHOL, HDL, LDLCALC, TRIG, CHOLHDL, LDLDIRECT in the last 72 hours. Thyroid Function Tests: No results for input(s): TSH, T4TOTAL, FREET4, T3FREE, THYROIDAB in the last 72 hours. Anemia Panel: No results for input(s): VITAMINB12, FOLATE, FERRITIN, TIBC, IRON, RETICCTPCT in the last 72 hours. Urine analysis:    Component Value Date/Time   COLORURINE YELLOW 12/03/2011 0550   APPEARANCEUR CLOUDY (A) 12/03/2011 0550   LABSPEC 1.021 12/03/2011 0550   PHURINE 6.0 12/03/2011 0550   GLUCOSEU NEGATIVE 12/03/2011 0550   HGBUR LARGE (A) 12/03/2011 0550   BILIRUBINUR NEGATIVE  12/03/2011 0550   KETONESUR NEGATIVE 12/03/2011 0550   PROTEINUR NEGATIVE 12/03/2011 0550   UROBILINOGEN 1.0 12/03/2011 0550   NITRITE NEGATIVE 12/03/2011 0550   LEUKOCYTESUR NEGATIVE 12/03/2011 0550     ) Recent Results (from the past 240 hour(s))  Respiratory Panel by PCR     Status: Abnormal   Collection Time: 06/03/17 11:31 PM  Result Value Ref Range Status   Adenovirus NOT DETECTED NOT DETECTED Final   Coronavirus 229E DETECTED (A) NOT DETECTED Final   Coronavirus HKU1 NOT DETECTED NOT DETECTED Final   Coronavirus NL63 NOT DETECTED NOT DETECTED Final   Coronavirus OC43 NOT DETECTED NOT DETECTED Final   Metapneumovirus NOT DETECTED NOT DETECTED Final   Rhinovirus / Enterovirus NOT DETECTED  NOT DETECTED Final   Influenza A NOT DETECTED NOT DETECTED Final   Influenza B NOT DETECTED NOT DETECTED Final   Parainfluenza Virus 1 NOT DETECTED NOT DETECTED Final   Parainfluenza Virus 2 NOT DETECTED NOT DETECTED Final   Parainfluenza Virus 3 NOT DETECTED NOT DETECTED Final   Parainfluenza Virus 4 NOT DETECTED NOT DETECTED Final   Respiratory Syncytial Virus NOT DETECTED NOT DETECTED Final   Bordetella pertussis NOT DETECTED NOT DETECTED Final   Chlamydophila pneumoniae NOT DETECTED NOT DETECTED Final   Mycoplasma pneumoniae NOT DETECTED NOT DETECTED Final      Anti-infectives (From admission, onward)   Start     Dose/Rate Route Frequency Ordered Stop   06/07/17 1400  bictegravir-emtricitabine-tenofovir AF (BIKTARVY) 50-200-25 MG per tablet 1 tablet     1 tablet Oral Daily 06/07/17 1303     06/06/17 1500  fluconazole (DIFLUCAN) tablet 100 mg     100 mg Oral Daily 06/06/17 1423 06/13/17 0959   06/05/17 1800  sulfamethoxazole-trimethoprim (BACTRIM) 240 mg in dextrose 5 % 250 mL IVPB     240 mg 265 mL/hr over 60 Minutes Intravenous Every 6 hours 06/05/17 1610     06/04/17 1800  azithromycin (ZITHROMAX) 500 mg in sodium chloride 0.9 % 250 mL IVPB  Status:  Discontinued     500  mg 250 mL/hr over 60 Minutes Intravenous Every 24 hours 06/03/17 1721 06/05/17 1553   06/04/17 1700  cefTRIAXone (ROCEPHIN) 1 g in sodium chloride 0.9 % 100 mL IVPB  Status:  Discontinued     1 g 200 mL/hr over 30 Minutes Intravenous Every 24 hours 06/03/17 1721 06/05/17 1553   06/03/17 1645  cefTRIAXone (ROCEPHIN) 1 g in sodium chloride 0.9 % 100 mL IVPB     1 g 200 mL/hr over 30 Minutes Intravenous  Once 06/03/17 1637 06/03/17 1741   06/03/17 1645  azithromycin (ZITHROMAX) 500 mg in sodium chloride 0.9 % 250 mL IVPB     500 mg 250 mL/hr over 60 Minutes Intravenous  Once 06/03/17 1637 06/03/17 2030       Radiology Studies: No results found.  Scheduled Meds: . bictegravir-emtricitabine-tenofovir AF  1 tablet Oral Daily  . enoxaparin (LOVENOX) injection  40 mg Subcutaneous Q24H  . famotidine  20 mg Oral BID  . fluconazole  100 mg Oral Daily  . pantoprazole  40 mg Oral Daily  . phosphorus  250 mg Oral Daily  . predniSONE  40 mg Oral BID WC   Followed by  . [START ON 06/09/2017] predniSONE  40 mg Oral Q breakfast   Followed by  . [START ON 06/14/2017] predniSONE  20 mg Oral Q breakfast  . sodium chloride flush  3 mL Intravenous Q12H   Continuous Infusions: . sulfamethoxazole-trimethoprim 240 mg (06/08/17 0739)     LOS: 3 days    Time spent: 25 min    GARBA,LAWAL, MD [facility] Pager [email or web address]  If 7PM-7AM, please contact night-coverage [email or web address] Password [email or web address] 06/08/2017, 9:20 AM

## 2017-06-09 DIAGNOSIS — L309 Dermatitis, unspecified: Secondary | ICD-10-CM

## 2017-06-09 DIAGNOSIS — R1013 Epigastric pain: Secondary | ICD-10-CM

## 2017-06-09 DIAGNOSIS — B59 Pneumocystosis: Secondary | ICD-10-CM

## 2017-06-09 DIAGNOSIS — Z720 Tobacco use: Secondary | ICD-10-CM

## 2017-06-09 MED ORDER — SENNA 8.6 MG PO TABS
2.0000 | ORAL_TABLET | Freq: Every day | ORAL | Status: DC
  Administered 2017-06-09 – 2017-06-11 (×3): 17.2 mg via ORAL
  Filled 2017-06-09 (×3): qty 2

## 2017-06-09 MED ORDER — ALUM & MAG HYDROXIDE-SIMETH 200-200-20 MG/5ML PO SUSP
30.0000 mL | Freq: Four times a day (QID) | ORAL | Status: DC | PRN
  Administered 2017-06-10 – 2017-06-11 (×3): 30 mL via ORAL
  Filled 2017-06-09 (×3): qty 30

## 2017-06-09 MED ORDER — BISACODYL 10 MG RE SUPP: 10.0000 mg | Freq: Every day | RECTAL | Status: DC | PRN

## 2017-06-09 NOTE — Progress Notes (Signed)
PROGRESS NOTE   Krystal Short  BJY:[id number]    DOB: 1985/04/28    DOA: 06/03/2017  PCP: Patient, No Pcp Per   I have briefly reviewed patients previous medical records in [facility].  Brief Narrative:  32 year old married female with no known prior significant PMH, tobacco abuse, diagnosed with influenza on 4/5 and treated with Tamiflu but did not get better, seen at urgent care 2 weeks later and diagnosed with RLL pneumonia and completed a course of antibiotics and steroids but had ongoing productive cough, subjective fevers, dyspnea on exertion and presented to ED 4/28.  Now diagnosed newly with HIV and presumed PCP.  ID consulted.  Improving.   Assessment & Plan:   Principal Problem:   CAP (community acquired pneumonia) Active Problems:   Pleurisy   Tachycardia   Facial dermatitis   Tobacco abuse   Acute hyponatremia   Community acquired pneumonia   Thrush of mouth and esophagus ([place])   AIDS ([place])   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 ([place])   Newly diagnosed HIV: HIV screen +.  HIV 1 Ab +.  HIV 2 Ab -.  CD4 count: 10.  Hepatitis panel, RPR, chlamydia and gonorrhea probe: Negative.  Infectious disease consulted.  Viral load and genotype pending.  Started on Biktarvy 5/2.  Outpatient follow-up in ID clinic in 2 weeks with Dr. Bobby Rumpf or Ulice Brilliant.  Presumed PCP pneumonia: HIV work-up as indicated above.  Initially treated with IV azithromycin and ceftriaxone.  ID was consulted.  Now transitioned to 21 days of Bactrim DS 2 tid + prednisone taper for 21 days.  RSV panel positive for coronavirus.  Slowly improving but still hypoxic with activity.  Patient also on Diflucan for oral thrush?  Need to continue at DC.  Sputum culture 4/5: No group A strep identified.  Urine pneumococcal and Legionella antigen negative.  CTA chest 4/29: No PE, thoracic aortic aneurysm or dissection.  Widespread ASD bilaterally.  Pilar Plate consolidation in portions of right lower  lobe.  Diffuse groundglass opacity.  Degree of alveolar edema.  TTE: LVEF 60-65%.  Oral thrush: Remains on fluconazole.  Acute respiratory failure with hypoxia: Secondary to suspected PCP pneumonia.  Treat pneumonia as above.  She desaturated to 84% on room air just standing in the room.  Wean off of oxygen as tolerated.  May need home oxygen temporarily.  Normocytic anemia: Likely chronic disease.  Stable.  Follow CBCs periodically.  Mild hyponatremia: Clinically euvolemic.  Could be related to PCP.  Stable.  Hypokalemia/hypomagnesemia/hypophosphatemia: Replaced.  Facial rash/dermatitis: Rash apparently ongoing for 1 year.  May need outpatient dermatology consultation.  Tobacco abuse: Cessation counseled.  Abdominal pain: DD: Gastritis, musculoskeletal related to prior coughing, constipation.  Continue PPI.  Added Maalox.  Bowel regimen.  Monitor closely.  Sinus tachycardia: Resolved.  Not on telemetry.   DVT prophylaxis: Lovenox Code Status: Full Family Communication: None at bed Disposition: See home possibly in the next 1-2 days.   Consultants:  Infectious  Procedures:  None  Antimicrobials:  IV ceftriaxone and azithromycin-discontinued Fluconazole > Bactrim >   Subjective: Cough has improved, mild intermittent and less productive.  Denies chest pain on coughing.  Dyspnea on mild exertion, walking to the bathroom.  Since sometime last night, reports pain in the epigastric/on the ribcages mostly on the right side.  Last BM 3 days ago.  No nausea or vomiting.  ROS: As above.  Objective:  Vitals:   06/08/17 1355 06/08/17 2215 06/09/17 0522  06/09/17 1307  BP: 102/71 (!) 96/57 110/66 96/64  Pulse: 78 74 71 87  Resp: (!) 22 16 12 16   Temp: 97.7 F (36.5 C) 98.6 F (37 C) 98 F (36.7 C) 97.7 F (36.5 C)  TempSrc: Oral Oral Oral Oral  SpO2: 98% 100% 100% 97%  Weight:      Height:        Examination:  General exam: Pleasant young female, moderately built in  thinly nourished, lying comfortably propped up in bed without distress. Respiratory system: Slightly harsh breath sounds bilaterally without wheezing, rhonchi or crackles. Respiratory effort normal. Cardiovascular system: S1 & S2 heard, RRR. No JVD, murmurs, rubs, gallops or clicks. No pedal edema. Gastrointestinal system: Abdomen is nondistended, soft.  Mild tenderness in epigastric region without rigidity, guarding or rebound.  No chest wall tenderness appreciated. No organomegaly or masses felt. Normal bowel sounds heard. Central nervous system: Alert and oriented. No focal neurological deficits. Extremities: Symmetric 5 x 5 power. Skin: Multiple facial small polyp-like lesions. Psychiatry: Judgement and insight appear normal. Mood & affect appropriate.     Data Reviewed: I have personally reviewed following labs and imaging studies  CBC: Recent Labs  Lab 06/03/17 1413 06/04/17 1633 06/06/17 0502 06/07/17 1022 06/08/17 0402  WBC 7.8 7.4 9.7 8.7 11.2*  NEUTROABS 5.4  --   --  6.7 9.7*  HGB 11.7* 10.4* 9.9* 11.4* 10.4*  HCT 34.4* 31.5* 29.7* 33.5* 30.5*  MCV 94.5 94.6 94.0 93.1 91.6  PLT 350 335 332 328 675   Basic Metabolic Panel: Recent Labs  Lab 06/03/17 1413 06/03/17 2320 06/04/17 0022 06/06/17 0502 06/07/17 1022 06/08/17 0402  NA 132*  --  135 136 136 132*  K 3.7  --  3.8 3.8 3.2* 4.5  CL 98*  --  110 102 101 102  CO2 23  --  20* 25 24 23   GLUCOSE 101*  --  102* 110* 95 113*  BUN 6  --  5* <5* <5* 6  CREATININE 0.66  --  0.58 0.59 0.74 0.73  CALCIUM 8.2*  --  7.2* 8.4* 8.8* 8.7*  MG  --  1.5* 1.4* 1.7 1.5* 1.7  PHOS  --  2.1*  --   --   --  3.2   Liver Function Tests: Recent Labs  Lab 06/03/17 1413 06/04/17 0022  AST 30 25  ALT 12* 11*  ALKPHOS 69 51  BILITOT 0.6 0.4  PROT 8.4* 7.1  ALBUMIN 2.6* 2.2*     Recent Results (from the past 240 hour(s))  Respiratory Panel by PCR     Status: Abnormal   Collection Time: 06/03/17 11:31 PM  Result Value Ref  Range Status   Adenovirus NOT DETECTED NOT DETECTED Final   Coronavirus 229E DETECTED (A) NOT DETECTED Final   Coronavirus HKU1 NOT DETECTED NOT DETECTED Final   Coronavirus NL63 NOT DETECTED NOT DETECTED Final   Coronavirus OC43 NOT DETECTED NOT DETECTED Final   Metapneumovirus NOT DETECTED NOT DETECTED Final   Rhinovirus / Enterovirus NOT DETECTED NOT DETECTED Final   Influenza A NOT DETECTED NOT DETECTED Final   Influenza B NOT DETECTED NOT DETECTED Final   Parainfluenza Virus 1 NOT DETECTED NOT DETECTED Final   Parainfluenza Virus 2 NOT DETECTED NOT DETECTED Final   Parainfluenza Virus 3 NOT DETECTED NOT DETECTED Final   Parainfluenza Virus 4 NOT DETECTED NOT DETECTED Final   Respiratory Syncytial Virus NOT DETECTED NOT DETECTED Final   Bordetella pertussis NOT DETECTED NOT DETECTED Final  Chlamydophila pneumoniae NOT DETECTED NOT DETECTED Final   Mycoplasma pneumoniae NOT DETECTED NOT DETECTED Final         Radiology Studies: No results found.      Scheduled Meds: . bictegravir-emtricitabine-tenofovir AF  1 tablet Oral Daily  . enoxaparin (LOVENOX) injection  40 mg Subcutaneous Q24H  . famotidine  20 mg Oral BID  . fluconazole  100 mg Oral Daily  . pantoprazole  40 mg Oral Daily  . phosphorus  250 mg Oral Daily  . predniSONE  40 mg Oral Q breakfast   Followed by  . [START ON 06/14/2017] predniSONE  20 mg Oral Q breakfast  . senna  2 tablet Oral Daily  . sodium chloride flush  3 mL Intravenous Q12H  . sulfamethoxazole-trimethoprim  2 tablet Oral Q8H   Continuous Infusions:   LOS: 4 days     Vernell Leep, MD, FACP, [facility]. [facility] Pager [phone]  If 7PM-7AM, please contact night-coverage [email or web address] Password [email or web address] 06/09/2017, 2:14 PM

## 2017-06-09 NOTE — Progress Notes (Signed)
SATURATION QUALIFICATIONS: (This note is used to comply with regulatory documentation for home oxygen)  Patient Saturations on Room Air at Rest = 93%  Patient Saturations on Room Air while Ambulating= 84% just standing around in the room  prior to the walking in the hallway. Patient stated she can be on RA prior to going to the rest room  and when she returns she has to use O2 100% of the time to re- oxygenate.  Patient Saturations on  2 Liters of oxygen while Ambulating = 94-87% Longer patient walked the more her sats decreased. We walked 120+ feet and increased patient to 4L and she was 87 when when return to the room. Patient recoverd in less than 2 mins on 4L to 100%.  Patient is 96 percent now on 2L.

## 2017-06-10 NOTE — Progress Notes (Signed)
PROGRESS NOTE   Krystal Short  KGM:[id number]    DOB: 08-16-1985    DOA: 06/03/2017  PCP: Patient, No Pcp Per   I have briefly reviewed patients previous medical records in [facility].  Brief Narrative:  32 year old married female with no known prior significant PMH, tobacco abuse, diagnosed with influenza on 4/5 and treated with Tamiflu but did not get better, seen at urgent care 2 weeks later and diagnosed with RLL pneumonia and completed a course of antibiotics and steroids but had ongoing productive cough, subjective fevers, dyspnea on exertion and presented to ED 4/28.  Now diagnosed newly with HIV and presumed PCP.  ID consulted.  Improving.   Assessment & Plan:   Principal Problem:   CAP (community acquired pneumonia) Active Problems:   Pleurisy   Tachycardia   Facial dermatitis   Tobacco abuse   Acute hyponatremia   Community acquired pneumonia   Thrush of mouth and esophagus ([place])   AIDS ([place])   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 ([place])   Newly diagnosed HIV: HIV screen +.  HIV 1 Ab +.  HIV 2 Ab -.  CD4 count: 10.  Hepatitis panel, RPR, chlamydia and gonorrhea probe: Negative.  Infectious disease consulted.  Viral load and genotype pending.  Started on Biktarvy 5/2.  Outpatient follow-up in ID clinic in 2 weeks with Dr. Bobby Rumpf or Ulice Brilliant.  Stable.  Presumed PCP pneumonia: HIV work-up as indicated above.  Initially treated with IV azithromycin and ceftriaxone.  ID was consulted.  Now transitioned to 21 days of Bactrim DS 2 tid + prednisone taper for 21 days.  RSV panel positive for coronavirus.  Slowly improving but still dyspneic and hypoxic with minimal activity.  Complete 7 days of Diflucan for oral thrush.  Sputum culture 4/5: No group A strep identified.  Urine pneumococcal and Legionella antigen negative.  CTA chest 4/29: No PE, thoracic aortic aneurysm or dissection.  Widespread ASD bilaterally.  Pilar Plate consolidation in portions of  right lower lobe.  Diffuse groundglass opacity.  Degree of alveolar edema.  TTE: LVEF 60-65%.  Slowly improving.  Oral thrush: Remains on fluconazole, complete total 7 days course..  Acute respiratory failure with hypoxia: Secondary to suspected PCP pneumonia.  Treat pneumonia as above.  She desaturated to 84% on room air just standing in the room on 5/4.  Wean off of oxygen as tolerated.  May need home oxygen temporarily.  Reassess home oxygen requirement prior to possible discharge 5/6.  Normocytic anemia: Likely chronic disease.  Stable.  Follow CBCs periodically.  Mild hyponatremia: Clinically euvolemic.  Could be related to PCP.  Stable.  Hypokalemia/hypomagnesemia/hypophosphatemia: Replaced.  Facial rash/dermatitis: Rash apparently ongoing for 1 year.  May need outpatient dermatology consultation.?  Improving.  Tobacco abuse: Cessation counseled.  Abdominal pain: DD: Gastritis, musculoskeletal related to prior coughing, constipation.  Continue PPI.  Added Maalox.  Bowel regimen.  Monitor closely.  Relief with Tylenol suggesting musculoskeletal rather than others.  Better but still has pain.  Sinus tachycardia: Resolved.  Not on telemetry.   DVT prophylaxis: Lovenox Code Status: Full Family Communication: None at bed Disposition: DC home pending ID follow-up possibly 5/6.   Consultants:  Infectious  Procedures:  None  Antimicrobials:  IV ceftriaxone and azithromycin-discontinued Fluconazole > Bactrim >   Subjective: Still having intermittent epigastric/lower substernal region pain, intermittent, relieved with Tylenol, had small soft BM today, has not tried Maalox.  Gets easily short of breath just getting up from her  bed and going to the bathroom.  As per RN, no acute issues reported.  ROS: As above.  Objective:  Vitals:   06/09/17 0522 06/09/17 1307 06/09/17 2051 06/10/17 0425  BP: 110/66 96/64 91/61  95/60  Pulse: 71 87 75 76  Resp: 12 16 17 18   Temp: 98 F  (36.7 C) 97.7 F (36.5 C) 97.7 F (36.5 C) (!) 97.5 F (36.4 C)  TempSrc: Oral Oral Oral Oral  SpO2: 100% 97% 98% 98%  Weight:      Height:        Examination: No significant change in exam compared to 06/09/2017.  Patient was interviewed and examined along with her RN in room.  General exam: Pleasant young female, moderately built in thinly nourished, lying comfortably propped up in bed without distress. Respiratory system: Slightly harsh breath sounds bilaterally without wheezing, rhonchi or crackles. Respiratory effort normal. Cardiovascular system: S1 & S2 heard, RRR. No JVD, murmurs, rubs, gallops or clicks. No pedal edema. Gastrointestinal system: Abdomen is nondistended, soft.  Mild tenderness in epigastric region without rigidity, guarding or rebound.  No chest wall tenderness appreciated. No organomegaly or masses felt. Normal bowel sounds heard. Central nervous system: Alert and oriented. No focal neurological deficits. Extremities: Symmetric 5 x 5 power. Skin: Multiple facial small polyp-like lesions. Psychiatry: Judgement and insight appear normal. Mood & affect appropriate.     Data Reviewed: I have personally reviewed following labs and imaging studies  CBC: Recent Labs  Lab 06/03/17 1413 06/04/17 1633 06/06/17 0502 06/07/17 1022 06/08/17 0402  WBC 7.8 7.4 9.7 8.7 11.2*  NEUTROABS 5.4  --   --  6.7 9.7*  HGB 11.7* 10.4* 9.9* 11.4* 10.4*  HCT 34.4* 31.5* 29.7* 33.5* 30.5*  MCV 94.5 94.6 94.0 93.1 91.6  PLT 350 335 332 328 474   Basic Metabolic Panel: Recent Labs  Lab 06/03/17 1413 06/03/17 2320 06/04/17 0022 06/06/17 0502 06/07/17 1022 06/08/17 0402  NA 132*  --  135 136 136 132*  K 3.7  --  3.8 3.8 3.2* 4.5  CL 98*  --  110 102 101 102  CO2 23  --  20* 25 24 23   GLUCOSE 101*  --  102* 110* 95 113*  BUN 6  --  5* <5* <5* 6  CREATININE 0.66  --  0.58 0.59 0.74 0.73  CALCIUM 8.2*  --  7.2* 8.4* 8.8* 8.7*  MG  --  1.5* 1.4* 1.7 1.5* 1.7  PHOS  --   2.1*  --   --   --  3.2   Liver Function Tests: Recent Labs  Lab 06/03/17 1413 06/04/17 0022  AST 30 25  ALT 12* 11*  ALKPHOS 69 51  BILITOT 0.6 0.4  PROT 8.4* 7.1  ALBUMIN 2.6* 2.2*     Recent Results (from the past 240 hour(s))  Respiratory Panel by PCR     Status: Abnormal   Collection Time: 06/03/17 11:31 PM  Result Value Ref Range Status   Adenovirus NOT DETECTED NOT DETECTED Final   Coronavirus 229E DETECTED (A) NOT DETECTED Final   Coronavirus HKU1 NOT DETECTED NOT DETECTED Final   Coronavirus NL63 NOT DETECTED NOT DETECTED Final   Coronavirus OC43 NOT DETECTED NOT DETECTED Final   Metapneumovirus NOT DETECTED NOT DETECTED Final   Rhinovirus / Enterovirus NOT DETECTED NOT DETECTED Final   Influenza A NOT DETECTED NOT DETECTED Final   Influenza B NOT DETECTED NOT DETECTED Final   Parainfluenza Virus 1 NOT DETECTED NOT DETECTED Final  Parainfluenza Virus 2 NOT DETECTED NOT DETECTED Final   Parainfluenza Virus 3 NOT DETECTED NOT DETECTED Final   Parainfluenza Virus 4 NOT DETECTED NOT DETECTED Final   Respiratory Syncytial Virus NOT DETECTED NOT DETECTED Final   Bordetella pertussis NOT DETECTED NOT DETECTED Final   Chlamydophila pneumoniae NOT DETECTED NOT DETECTED Final   Mycoplasma pneumoniae NOT DETECTED NOT DETECTED Final         Radiology Studies: No results found.      Scheduled Meds: . bictegravir-emtricitabine-tenofovir AF  1 tablet Oral Daily  . enoxaparin (LOVENOX) injection  40 mg Subcutaneous Q24H  . famotidine  20 mg Oral BID  . fluconazole  100 mg Oral Daily  . pantoprazole  40 mg Oral Daily  . phosphorus  250 mg Oral Daily  . predniSONE  40 mg Oral Q breakfast   Followed by  . [START ON 06/14/2017] predniSONE  20 mg Oral Q breakfast  . senna  2 tablet Oral Daily  . sodium chloride flush  3 mL Intravenous Q12H  . sulfamethoxazole-trimethoprim  2 tablet Oral Q8H   Continuous Infusions:   LOS: 5 days     Vernell Leep, MD, FACP,  [facility]. [facility] Pager [phone]  If 7PM-7AM, please contact night-coverage [email or web address] Password [email or web address] 06/10/2017, 12:06 PM

## 2017-06-11 DIAGNOSIS — J9601 Acute respiratory failure with hypoxia: Secondary | ICD-10-CM

## 2017-06-11 MED ORDER — ALUM & MAG HYDROXIDE-SIMETH 200-200-20 MG/5ML PO SUSP: 30.0000 mL | Freq: Four times a day (QID) | ORAL | 0 refills | Status: DC | PRN

## 2017-06-11 MED ORDER — FAMOTIDINE 20 MG PO TABS: 20.0000 mg | ORAL_TABLET | Freq: Two times a day (BID) | ORAL | 0 refills | Status: DC

## 2017-06-11 MED FILL — FAMOTIDINE 20 MG TABLET: 20 | 30 days supply | Qty: 60 | Fill #0

## 2017-06-11 NOTE — Discharge Summary (Signed)
Physician Discharge Summary  Krystal Short AVW:[id number] DOB: 08-13-1985  PCP: Seward Carol, MD  Admit date: 06/03/2017 Discharge date: 06/11/2017  Recommendations for Outpatient Follow-up:  1. Mauricio Po, FNP/ID on 06/26/2017 at 10 AM.  To be seen with repeat labs (CBC & CMP). 2. Dr. Seward Carol, PCP on 08/06/2017 at 1:45 PM. 3. Consider outpatient dermatology consultation for facial rash.  Home Health: None Equipment/Devices: Oxygen via nasal cannula at 3 L/min continuously.  Discharge Condition: Improved and stable CODE STATUS: Full Diet recommendation: Regular diet.  Discharge Diagnoses:  Principal Problem:   CAP (community acquired pneumonia) Active Problems:   Pleurisy   Tachycardia   Facial dermatitis   Tobacco abuse   Acute hyponatremia   Community acquired pneumonia   Thrush of mouth and esophagus ([place])   AIDS ([place])   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 [facility])   Brief Summary: 32 year old married female with no known prior significant PMH, tobacco abuse, diagnosed with influenza on 4/5 and treated with Tamiflu but did not get better, seen at urgent care 2 weeks later and diagnosed with RLL pneumonia and completed a course of antibiotics and steroids but had ongoing productive cough, subjective fevers, dyspnea on exertion and presented to ED 4/28.  Now diagnosed newly with HIV and presumed PCP.  ID consulted.     Assessment & Plan:   Newly diagnosed HIV: HIV screen +.  HIV 1 Ab +.  HIV 2 Ab -.  CD4 count: 10.  Hepatitis panel, RPR, chlamydia and gonorrhea probe: Negative.  Infectious disease consulted.  Viral load and genotype pending.  Started on Biktarvy 5/2.  She has ART with her and has outpatient follow-up in ID clinic in 2 weeks with Ulice Brilliant.  Stable.  Presumed PCP pneumonia: HIV work-up as indicated above.  Initially treated with IV azithromycin and ceftriaxone.  ID was consulted.  Now transitioned to 21 days of Bactrim DS 2 tid +  prednisone taper for 21 days.  RSV panel positive for coronavirus.  Slowly improving but still dyspneic and hypoxic with minimal activity.  Completed 6 days of Diflucan for oral thrush which has resolved.  Sputum culture 4/5: No group A strep identified.  Urine pneumococcal and Legionella antigen negative.  CTA chest 4/29: No PE, thoracic aortic aneurysm or dissection.  Widespread ASD bilaterally.  Pilar Plate consolidation in portions of right lower lobe.  Diffuse groundglass opacity.  Degree of alveolar edema.  TTE: LVEF 60-65%.  Slowly improving.  Patient has Bactrim DS and prednisone prescriptions already filled.  Discussed with pharmacy and advised patient to skip days of these medications that she has already received in the hospital and complete the remainder of course at home.  Oral thrush: Completed 6 days of fluconazole.  Thrush resolved.  Discontinued at discharge.  Acute respiratory failure with hypoxia: Secondary to suspected PCP pneumonia.  Treat pneumonia as above.    She remains hypoxic on room air and will be discharged on home oxygen.  This has to be reassessed during outpatient follow-up and wean off as tolerated.  Normocytic anemia: Likely chronic disease.  Stable.  Follow CBCs periodically.  Mild hyponatremia: Clinically euvolemic.  Could be related to PCP.  Stable.  Hypokalemia/hypomagnesemia/hypophosphatemia: Replaced.  Periodic outpatient follow-up.  Facial rash/dermatitis: Rash apparently ongoing for 1 year.  May need outpatient dermatology consultation.?  Improving.  Tobacco abuse: Cessation counseled.  Abdominal pain: DD: Gastritis, musculoskeletal related to prior coughing, constipation.    As per patient, improved with Maalox that she  got yesterday.  Continue PRN Maalox and Pepcid.  Resolved.  Sinus tachycardia: Resolved.  Not on telemetry.   Consultants:  Infectious disease PCCM  Procedures:  None    Discharge Instructions  Discharge Instructions     Call MD for:  difficulty breathing, headache or visual disturbances   Complete by:  As directed    Call MD for:  extreme fatigue   Complete by:  As directed    Call MD for:  persistant dizziness or light-headedness   Complete by:  As directed    Call MD for:  persistant nausea and vomiting   Complete by:  As directed    Call MD for:  severe uncontrolled pain   Complete by:  As directed    Call MD for:  temperature >100.4   Complete by:  As directed    Diet general   Complete by:  As directed    Increase activity slowly   Complete by:  As directed        Medication List    STOP taking these medications   amoxicillin-clavulanate 875-125 MG tablet Commonly known as:  AUGMENTIN   benzonatate 200 MG capsule Commonly known as:  TESSALON   Olopatadine HCl 0.7 % Soln Commonly known as:  PAZEO   ondansetron 4 MG tablet Commonly known as:  ZOFRAN     TAKE these medications   albuterol 108 (90 Base) MCG/ACT inhaler Commonly known as:  PROVENTIL HFA;VENTOLIN HFA Inhale 1-2 puffs into the lungs every 6 (six) hours as needed for wheezing or shortness of breath.   alum & mag hydroxide-simeth 200-200-20 MG/5ML suspension Commonly known as:  MAALOX/MYLANTA Take 30 mLs by mouth every 6 (six) hours as needed for indigestion or heartburn.   bictegravir-emtricitabine-tenofovir AF 50-200-25 MG Tabs tablet Commonly known as:  BIKTARVY Take 1 tablet by mouth daily.   famotidine 20 MG tablet Commonly known as:  PEPCID Take 1 tablet (20 mg total) by mouth 2 (two) times daily.   ibuprofen 200 MG tablet Commonly known as:  ADVIL,MOTRIN Take 400 mg by mouth every 6 (six) hours as needed for fever or headache (pain).   MUCINEX PO Take 1 tablet by mouth 2 (two) times daily as needed (cough/congestion).   predniSONE 20 MG tablet Commonly known as:  DELTASONE Take 2 tablets (40 mg total) by mouth daily with breakfast for 5 days, THEN 1 tablet (20 mg total) daily with breakfast for 11  days. Start taking on:  06/09/2017 What changed:    medication strength  See the new instructions.   sulfamethoxazole-trimethoprim 800-160 MG tablet Commonly known as:  BACTRIM DS,SEPTRA DS Take 2 tablets by mouth every 8 (eight) hours for 17 days.      Follow-up Information    [facility]. Go on 08/06/2017.   Why:  Appointment with Dr.Ronald Polite 08/06/2017 at 1:45pm.   Contact information:    [address], [address] [place]       [place], FNP Follow up on 06/26/2017.   Specialties:  Family Medicine, Infectious Diseases Why:  10 am. To be seen with repeat labs (CBC & CMP). Contact information: [address] [place] [address] [phone]          No Known Allergies    Procedures/Studies: Dg Chest 2 View  Result Date: 06/03/2017 CLINICAL DATA:  Persistent cough and shortness of breath EXAM: CHEST - 2 VIEW  COMPARISON:  04/13/2016 FINDINGS: Low volumes with patchy bilateral lung opacity distinct from medial right base opacity seen previously. Normal heart size. Normal mediastinal contours. No effusion or pneumothorax. No acute osseous finding. IMPRESSION: Low volumes with bilateral atelectasis or pneumonia. Electronically Signed   By: Monte Fantasia M.D.   On: 06/03/2017 15:08   Ct Angio Chest Pe W Or Wo Contrast  Result Date: 06/04/2017 CLINICAL DATA:  Shortness of breath and chest pain EXAM: CT ANGIOGRAPHY CHEST WITH CONTRAST TECHNIQUE: Multidetector CT imaging of the chest was performed using the standard protocol during bolus administration of intravenous contrast. Multiplanar CT image reconstructions and MIPs were obtained to evaluate the vascular anatomy. CONTRAST:  142mL ISOVUE-370 IOPAMIDOL (ISOVUE-370) INJECTION 76% COMPARISON:  Chest radiograph June 03, 2017 FINDINGS: Cardiovascular: There is no demonstrable pulmonary embolus. There is no thoracic aortic aneurysm or dissection.  The visualized great vessels appear normal. There is no pericardial effusion or pericardial thickening. Mediastinum/Nodes: Visualized thyroid appears unremarkable. There are scattered subcentimeter mediastinal lymph nodes. There is no adenopathy by size criteria on this study. There is a small hiatal hernia. Lungs/Pleura: There is multifocal airspace opacity, likely due to a combination of small airways obstructive disease and atelectasis. There is frank consolidation throughout portions of the right lower lobe. There is possibly a degree of superimposed alveolar edema. There are small pleural effusions bilaterally. Upper Abdomen: Visualized upper abdominal structures appear unremarkable. Musculoskeletal: There are no blastic or lytic bone lesions. No chest wall lesions are evident. Review of the MIP images confirms the above findings. IMPRESSION: 1. No demonstrable pulmonary embolus. No thoracic aortic aneurysm or dissection. 2. Widespread airspace opacity bilaterally. There is frank consolidation in portions of the right lower lobe. Diffuse ground-glass opacity elsewhere is likely due to small airways obstructive disease and atelectasis. A degree of alveolar edema superimposed cannot be excluded. 3.  No adenopathy by size criteria. 4.  Small hiatal hernia. Electronically Signed   By: Lowella Grip III M.D.   On: 06/04/2017 15:42      Subjective: States that she feels much better.  Abdominal pain resolved yesterday after 2 doses of Maalox.  No chest pain, cough, fever or chills.  No dizziness or lightheadedness.  Dyspnea on exertion.  Discharge Exam:  Vitals:   06/10/17 0425 06/10/17 1426 06/10/17 2100 06/11/17 0500  BP: 95/60 103/67 97/68 99/81   Pulse: 76 79 82 71  Resp: 18 18 18 18   Temp: (!) 97.5 F (36.4 C) 98 F (36.7 C) 98 F (36.7 C) 97.9 F (36.6 C)  TempSrc: Oral Oral Oral Oral  SpO2: 98% 97% 96% 99%  Weight:      Height:        General exam: Pleasant young female, moderately  built in thinly nourished, lying comfortably propped up in bed without distress. Respiratory system: Clear to auscultation. Respiratory effort normal. Cardiovascular system: S1 & S2 heard, RRR. No JVD, murmurs, rubs, gallops or clicks. No pedal edema. Gastrointestinal system: Abdomen is nondistended, soft and nontender today. No organomegaly or masses felt. Normal bowel sounds heard. Central nervous system: Alert and oriented. No focal neurological deficits. Extremities: Symmetric 5 x 5 power. Skin: Multiple facial minute polyp-like lesions. Psychiatry: Judgement and insight appear normal. Mood & affect appropriate.       The results of significant diagnostics from this hospitalization (including imaging, microbiology, ancillary and laboratory) are listed below for reference.     Microbiology: Recent Results (from the past 240 hour(s))  Respiratory Panel by PCR  Status: Abnormal   Collection Time: 06/03/17 11:31 PM  Result Value Ref Range Status   Adenovirus NOT DETECTED NOT DETECTED Final   Coronavirus 229E DETECTED (A) NOT DETECTED Final   Coronavirus HKU1 NOT DETECTED NOT DETECTED Final   Coronavirus NL63 NOT DETECTED NOT DETECTED Final   Coronavirus OC43 NOT DETECTED NOT DETECTED Final   Metapneumovirus NOT DETECTED NOT DETECTED Final   Rhinovirus / Enterovirus NOT DETECTED NOT DETECTED Final   Influenza A NOT DETECTED NOT DETECTED Final   Influenza B NOT DETECTED NOT DETECTED Final   Parainfluenza Virus 1 NOT DETECTED NOT DETECTED Final   Parainfluenza Virus 2 NOT DETECTED NOT DETECTED Final   Parainfluenza Virus 3 NOT DETECTED NOT DETECTED Final   Parainfluenza Virus 4 NOT DETECTED NOT DETECTED Final   Respiratory Syncytial Virus NOT DETECTED NOT DETECTED Final   Bordetella pertussis NOT DETECTED NOT DETECTED Final   Chlamydophila pneumoniae NOT DETECTED NOT DETECTED Final   Mycoplasma pneumoniae NOT DETECTED NOT DETECTED Final     Labs: CBC: Recent Labs  Lab  06/04/17 1633 06/06/17 0502 06/07/17 1022 06/08/17 0402  WBC 7.4 9.7 8.7 11.2*  NEUTROABS  --   --  6.7 9.7*  HGB 10.4* 9.9* 11.4* 10.4*  HCT 31.5* 29.7* 33.5* 30.5*  MCV 94.6 94.0 93.1 91.6  PLT 335 332 328 917   Basic Metabolic Panel: Recent Labs  Lab 06/06/17 0502 06/07/17 1022 06/08/17 0402  NA 136 136 132*  K 3.8 3.2* 4.5  CL 102 101 102  CO2 25 24 23   GLUCOSE 110* 95 113*  BUN <5* <5* 6  CREATININE 0.59 0.74 0.73  CALCIUM 8.4* 8.8* 8.7*  MG 1.7 1.5* 1.7  PHOS  --   --  3.2   BNP (last 3 results) Recent Labs    06/04/17 1633  BNP 118.3*     Time coordinating discharge: 35 minutes  SIGNED:  Vernell Leep, MD, FACP, [facility]. [facility] Pager [phone]  If 7PM-7AM, please contact night-coverage [email or web address] Password [email or web address] 06/11/2017, 1:16 PM

## 2017-06-11 NOTE — Progress Notes (Signed)
SATURATION QUALIFICATIONS: (This note is used to comply with regulatory documentation for home oxygen)  Patient Saturations on Room Air at Rest = 82%  Patient Saturations on Room Air while Ambulating = N/A, Pt could not tolerate ambulation on room air  Patient Saturations on 3 Liters of oxygen while Ambulating = 93%  Please briefly explain why patient needs home oxygen: Pt requires oxygen in order to maintain O2 saturations

## 2017-06-11 NOTE — Care Management Note (Signed)
Case Management Note  Patient Details  Name: Krystal Short MRN: [id number] Date of Birth: [birth year]  Subjective/Objective:   CAP                 Action/Plan: Transition to home with oxygen. Pt without PCP, NCM scheduled appointment with  [place] [facility] [place] Gaynelle Arabian, 08/06/2017 at 1:45pm. NCM made pt aware and noted on AVS  Pt states has transportation to home.  Expected Discharge Date:   06/11/2017           Expected Discharge Plan:  Home/Self Care  In-House Referral:     Discharge planning Services  CM Consult  Post Acute Care Choice:    Choice offered to:   patient  DME Arranged:   oxygen  DME Agency:   [facility], referral made .... Orders pending and have been requested  from MD by NCM.   HH Arranged:   n/a HH Agency:   n/a  Status of Service:  Completed, signed off  If discussed at [place] of Stay Meetings, dates discussed:    Additional Comments:  Sharin Mons, RN 06/11/2017, 10:53 AM

## 2017-06-11 NOTE — Progress Notes (Signed)
Pt discharged home with husband. instructed on how to "crack" a new oxygen tank as well as how to turn the key to start the flow.  Husband and pt verbalize understanding.  Letter given for work excuse.

## 2017-06-11 NOTE — Discharge Instructions (Signed)
Please get your medications reviewed and adjusted by your Primary MD. ° °Please request your Primary MD to go over all Hospital Tests and Procedure/Radiological results at the follow up, please get all Hospital records sent to your Prim MD by signing hospital release before you go home. ° °If you had Pneumonia of Lung problems at the Hospital: °Please get a 2 view Chest X ray done in 6-8 weeks after hospital discharge or sooner if instructed by your Primary MD. ° °If you have Congestive Heart Failure: °Please call your Cardiologist or Primary MD anytime you have any of the following symptoms:  °1) 3 pound weight gain in 24 hours or 5 pounds in 1 week  °2) shortness of breath, with or without a dry hacking cough  °3) swelling in the hands, feet or stomach  °4) if you have to sleep on extra pillows at night in order to breathe ° °Follow cardiac low salt diet and 1.5 lit/day fluid restriction. ° °If you have diabetes °Accuchecks 4 times/day, Once in AM empty stomach and then before each meal. °Log in all results and show them to your primary doctor at your next visit. °If any glucose reading is under 80 or above 300 call your primary MD immediately. ° °If you have Seizure/Convulsions/Epilepsy: °Please do not drive, operate heavy machinery, participate in activities at heights or participate in high speed sports until you have seen by Primary MD or a Neurologist and advised to do so again. ° °If you had Gastrointestinal Bleeding: °Please ask your Primary MD to check a complete blood count within one week of discharge or at your next visit. Your endoscopic/colonoscopic biopsies that are pending at the time of discharge, will also need to followed by your Primary MD. ° °Get Medicines reviewed and adjusted. °Please take all your medications with you for your next visit with your Primary MD ° °Please request your Primary MD to go over all hospital tests and procedure/radiological results at the follow up, please ask your  Primary MD to get all Hospital records sent to his/her office. ° °If you experience worsening of your admission symptoms, develop shortness of breath, life threatening emergency, suicidal or homicidal thoughts you must seek medical attention immediately by calling 911 or calling your MD immediately  if symptoms less severe. ° °You must read complete instructions/literature along with all the possible adverse reactions/side effects for all the Medicines you take and that have been prescribed to you. Take any new Medicines after you have completely understood and accpet all the possible adverse reactions/side effects.  ° °Do not drive or operate heavy machinery when taking Pain medications.  ° °Do not take more than prescribed Pain, Sleep and Anxiety Medications ° °Special Instructions: If you have smoked or chewed Tobacco  in the last 2 yrs please stop smoking, stop any regular Alcohol  and or any Recreational drug use. ° °Wear Seat belts while driving. ° °Please note °You were cared for by a hospitalist during your hospital stay. If you have any questions about your discharge medications or the care you received while you were in the hospital after you are discharged, you can call the unit and asked to speak with the hospitalist on call if the hospitalist that took care of you is not available. Once you are discharged, your primary care physician will handle any further medical issues. Please note that NO REFILLS for any discharge medications will be authorized once you are discharged, as it is imperative that you   return to your primary care physician (or establish a relationship with a primary care physician if you do not have one) for your aftercare needs so that they can reassess your need for medications and monitor your lab values.  You can reach the hospitalist office at phone [phone] or fax [phone]   If you do not have a primary care physician, you can call [phone] for a physician  referral.   HIV Infection and AIDS HIV (human immunodeficiency virus) infection is a permanent (chronic) viral infection. HIV kills white blood cells that are called CD4 cells. These cells help to control the body's defense system (immune system) and fight infection. If a person does not have enough CD4 cells, he or she can develop infections, cancers, and other health problems. What are the causes? This condition is caused by HIV. This virus is passed from one person to another person:  Through sex.  Through contact with infected blood.  During childbirth or breastfeeding.  What increases the risk? This condition is more likely to develop in people who:  Have unprotected sex.  Share needles or other drug equipment.  What are the signs or symptoms? Symptoms of this condition usually develop in phases: Asymptomatic phase You may not feel sick, or you may only feel sick some of the time. Many people in this phase do not know that they have HIV. Symptoms may include:  Low-grade fever.  Rash.  Fatigue.  Sore throat.  Headaches.  Nausea, vomiting, or diarrhea.  Night sweats.  Early symptomatic phase You may notice:  Your early symptoms getting worse or happening more often.  Oral, vaginal, or rectal sores that are caused by infections.  Problems that are related to inflammation, such as joint pain.  Symptomatic phase (AIDS, or acquired immunodeficiency syndrome) Your immune system no longer protects you from infections and other health problems. You may get infections that you would not normally get if your immune system was healthy and working properly (opportunistic diseases). Problems that are caused by opportunistic diseases include:  Coughing.  Trouble breathing.  Diarrhea.  Skin sores.  Trouble swallowing.  High fevers.  Blurred vision.  Stiff neck.  Mental confusion.  You may also begin to notice:  Weight loss.  Tingling or pain in your  hands and feet.  Mouth sores or tooth pain.  Severe fatigue.  How is this diagnosed? This condition is diagnosed with:  A screening test to check blood for a chemical (antibody) that is produced only when the body is fighting HIV.  A blood test to confirm the presence of HIV.  How is this treated? There is no cure for this condition, but treatment can help to keep HIV from getting worse.  You will be given medicines that may slow down the rate at which HIV multiplies in your body (antiretroviral therapy, or ART). ART may: ? Keep your immune system as healthy as possible and help it work better. ? Decrease the amount of HIV in your body. ? Reduce the risk of problems caused by HIV. ? Prolong your life. ? Improve the quality of your life. ? Help prevent passing HIV to someone.  You will need to have routine lab tests performed to monitor your treatment and immune system.  Follow these instructions at home: Medicines  Take over-the-counter and prescription medicines only as told by your health care provider.  If you were prescribed an antibiotic medicine, take it as told by your health care provider. Do not stop taking  the antibiotic even if you start to feel better. Lifestyle  Stop or decrease your use of alcohol and recreational drugs, which can cause further damage to your immune system. They can also cause problems with your liver, lungs, and heart.  Do not use any products that contain nicotine or tobacco, such as cigarettes and e-cigarettes. If you need help quitting, ask your health care provider.  Do not share needles or other equipment that is used for injecting, smoking, or snorting drugs.  Protect yourself from other STIs (sexually transmitted infections) by using condoms when you have sex. This includes vaginal, oral, and anal sex.  Eat in a healthy way, get enough sleep, and exercise. General instructions  Tell your sexual partners that you have HIV. Encourage  them to get tested.  Keep your vaccinations up to date. Make sure that you get all recommended vaccines, including vaccines for hepatitis A, hepatitis B, measles, and influenza.  See your dentist regularly. Brush and floss your teeth every day.  See a counselor or a Education officer, museum to help you solve problems and find any services that you need.  Get support from your family and friends.  Keep all follow-up visits as told by your health care provider. This is important. You will need to have routine blood tests every 3-6 months to monitor your health and to make sure your treatment is working. How is this prevented? To prevent the spread of HIV:  Talk with your health care provider about protecting your sexual partners from HIV. Your health care provider may encourage your partner to take medicines to decrease the risk of getting HIV (pre-exposure prophylaxis, or PrEP).  Use a condom every time you have sex. This includes vaginal, oral, and anal sex. ? The condom should be in place from the beginning of the sexual activity to the end. ? Use only latex or polyurethane condoms and water-based lubricants. ? Wearing a condom reduces, but does not completely eliminate, your risk of spreading HIV. ? Condoms also protect you from other STIs.  Avoid alcohol and recreational drugs that affect your judgment. They may make you forget to use a condom or may increase your chances of participating in high-risk sex.  Do not share equipment that is used to take drugs, such as needles, syringes, cookers, tourniquets, pipes, or straws. If you share equipment, clean it before and after you use it.  Contact a health care provider if:  You lose a lot of weight.  You have extreme fatigue.  You have trouble swallowing.  You have vomiting or diarrhea that does not get better.  You have muscle pain or joint pain.  You have any problems that are related to your medicines. Get help right away if:  You  have a rash that causes your skin to peel.  You develop blisters inside your mouth.  You have pain in your abdomen.  You have swelling around your eyes, or you have eye redness.  You have a high fever and chills.  You have shortness of breath.  You have a cough that is dry (nonproductive) or wet (productive).  You have vision problems, such as blind spots, flashing lights, or decreased or blurred vision.  You have a persistent headache, confusion, or changes in the way that you think, feel, or behave (altered mental status). This information is not intended to replace advice given to you by your health care provider. Make sure you discuss any questions you have with your health care provider.  Document Released: 11/07/2013 Document Revised: 08/13/2015 Document Reviewed: 07/12/2015 Elsevier Interactive Patient Education  2018 [employer].    Pneumocystis Pneumonia Pneumocystis pneumonia is an infection that affects the lungs. The infection is extremely rare in healthy people. It most frequently occurs in people whose natural disease-fighting system (immune system) is weak. What are the causes? This condition is caused by breathing in a fungus called Pneumocystis jiroveci. What are the signs or symptoms? Common symptoms of this condition include:  Fever.  Mild or dry cough.  Shortness of breath, especially with any physical activity.  Rapid breathing.  How is this diagnosed? This condition may be diagnosed with tests, such as:  A test in which a sample of fluid or tissue from your lungs is looked at under a microscope (immunologic stain). To get the sample, you may be asked to cough into a tissue. Or, a small, flexible tube will be guided to your lungs through your nose or mouth to get the sample (bronchoscopy).  A chest X-ray.  Blood tests.  How is this treated? This condition is treated with antibiotic medicine. Sometimes it is also treated with a medicine to reduce  inflammation (steroid). Treatment may begin before test results confirm your diagnosis. This is because the condition is very serious. Treatment may take several weeks. Follow these instructions at home:  Take over-the-counter and prescription medicines only as told by your health care provider.  Take your antibiotic medicine as told by your health care provider. Do not stop taking the antibiotic even if you start to feel better.  Do not smoke. Smoking can make your condition worse.  Keep all follow-up visits as told by your health care provider. This is important. How is this prevented? If you are at high risk for getting this condition again, your health care provider may prescribe an antibiotic to prevent this from happening. Contact a health care provider if:  Any of your symptoms are getting worse instead of better.  You have nausea or vomiting.  You have diarrhea.  You have a skin rash. This information is not intended to replace advice given to you by your health care provider. Make sure you discuss any questions you have with your health care provider. Document Released: 04/15/2002 Document Revised: 11/05/2015 Document Reviewed: 11/05/2015 Elsevier Interactive Patient Education  [employer].

## 2017-06-12 ENCOUNTER — Other Ambulatory Visit: Payer: Self-pay

## 2017-06-12 NOTE — Patient Outreach (Signed)
[facility]) Care Management  06/12/2017  Krystal Short [birth year] [id number]   Telephone call for transition of care call.  Member was hospitalized on 06/03/17 for pneumonia and new dx of HIV.  Member discharged on 06/11/17  Subjective: Member states that she is slowly getting better.  States that her home O2 was delivered and she is using.  States she gets SOB with exertion.  States she is still coughing.  States she has the incentive spirometer and she is using it.  States she is to see the doctor at infectious disease on 06/26/17.  States she does not have an appt with her primary care yet.  States she has called to file for her [employer].  States she does not remember if she has the short term disability or the hospital indemnity.  States that her husband is supportive and he will take her to appts if she is not able to drive.  States she has her medications and she uses the [facility].   Objective:  Per chart review Member was hospitalized on 06/03/17 for pneumonia and new dx of HIV.  Member discharged on 06/11/17.  Hx of tobacco abuse.    Assessment:  Transition of care call completed Reviewed discharge instructions and s/s to call MD Instructed to call primary care provider to schedule follow in the next 7 days Reinforced to keep appt with infectious disease on 06/26/17 Reviewed [facility] benefits and instructed to call Benefits line to verify if she has short term disability and hospital indemnity Discussed contacting the [facility] and given contact number.  Instructed RNCM will mail brochure for Employee Assistance Counseling Program to her   Plan: Plan to close case.  Member was assessed with no further interventions needed Plan to mail successful outreach letter Plan to mail Employee Assistance Counseling Program brochure  Peter Garter RN, [place] Management Coordinator [facility] [employer] [phone]

## 2017-06-20 ENCOUNTER — Emergency Department (HOSPITAL_COMMUNITY)
Admission: EM | Admit: 2017-06-20 | Discharge: 2017-06-20 | Disposition: A | Discharge status: Home / Self Care | Payer: No Typology Code available for payment source | Attending: Emergency Medicine | Admitting: Emergency Medicine

## 2017-06-20 ENCOUNTER — Emergency Department (HOSPITAL_BASED_OUTPATIENT_CLINIC_OR_DEPARTMENT_OTHER)
Admit: 2017-06-20 | Discharge: 2017-06-20 | Disposition: A | Discharge status: Home / Self Care | Payer: No Typology Code available for payment source | Attending: Emergency Medicine | Admitting: Emergency Medicine

## 2017-06-20 ENCOUNTER — Other Ambulatory Visit: Payer: Self-pay

## 2017-06-20 ENCOUNTER — Encounter (HOSPITAL_COMMUNITY): Payer: Self-pay

## 2017-06-20 DIAGNOSIS — M79609 Pain in unspecified limb: Secondary | ICD-10-CM

## 2017-06-20 DIAGNOSIS — Z87891 Personal history of nicotine dependence: Secondary | ICD-10-CM | POA: Insufficient documentation

## 2017-06-20 DIAGNOSIS — M79662 Pain in left lower leg: Secondary | ICD-10-CM | POA: Diagnosis not present

## 2017-06-20 DIAGNOSIS — B20 Human immunodeficiency virus [HIV] disease: Secondary | ICD-10-CM | POA: Insufficient documentation

## 2017-06-20 DIAGNOSIS — M79604 Pain in right leg: Secondary | ICD-10-CM

## 2017-06-20 DIAGNOSIS — M79661 Pain in right lower leg: Secondary | ICD-10-CM | POA: Insufficient documentation

## 2017-06-20 DIAGNOSIS — Z79899 Other long term (current) drug therapy: Secondary | ICD-10-CM | POA: Diagnosis not present

## 2017-06-20 DIAGNOSIS — M79605 Pain in left leg: Secondary | ICD-10-CM

## 2017-06-20 LAB — COMPREHENSIVE METABOLIC PANEL
ALT: 29 U/L (ref 14–54)
AST: 26 U/L (ref 15–41)
Albumin: 3.1 g/dL — ABNORMAL LOW (ref 3.5–5.0)
Alkaline Phosphatase: 49 U/L (ref 38–126)
Anion gap: 8 (ref 5–15)
BUN: 19 mg/dL (ref 6–20)
CHLORIDE: 103 mmol/L (ref 101–111)
CO2: 21 mmol/L — AB (ref 22–32)
Calcium: 8.4 mg/dL — ABNORMAL LOW (ref 8.9–10.3)
Creatinine, Ser: 1.2 mg/dL — ABNORMAL HIGH (ref 0.44–1.00)
GFR calc Af Amer: >60 mL/min (ref >60)
GFR, EST NON AFRICAN AMERICAN: 60 mL/min — AB (ref >60)
Glucose, Bld: 81 mg/dL (ref 65–99)
POTASSIUM: 4.8 mmol/L (ref 3.5–5.1)
Sodium: 132 mmol/L — ABNORMAL LOW (ref 135–145)
Total Bilirubin: 0.2 mg/dL — ABNORMAL LOW (ref 0.3–1.2)
Total Protein: 7.2 g/dL (ref 6.5–8.1)

## 2017-06-20 LAB — CBC WITH DIFFERENTIAL/PLATELET
Abs Immature Granulocytes: 0 10*3/uL (ref 0.0–0.1)
Basophils Absolute: 0 10*3/uL (ref 0.0–0.1)
Basophils Relative: 1 %
EOS ABS: 0 10*3/uL (ref 0.0–0.7)
Eosinophils Relative: 1 %
HEMATOCRIT: 36 % (ref 36.0–46.0)
Hemoglobin: 12.2 g/dL (ref 12.0–15.0)
Immature Granulocytes: 1 %
LYMPHS ABS: 1.2 10*3/uL (ref 0.7–4.0)
Lymphocytes Relative: 26 %
MCH: 31 pg (ref 26.0–34.0)
MCHC: 33.9 g/dL (ref 30.0–36.0)
MCV: 91.4 fL (ref 78.0–100.0)
MONO ABS: 0.4 10*3/uL (ref 0.1–1.0)
Monocytes Relative: 10 %
Neutro Abs: 2.8 10*3/uL (ref 1.7–7.7)
Neutrophils Relative %: 61 %
Platelets: 186 10*3/uL (ref 150–400)
RBC: 3.94 MIL/uL (ref 3.87–5.11)
RDW: 13.1 % (ref 11.5–15.5)
WBC: 4.4 10*3/uL (ref 4.0–10.5)

## 2017-06-20 LAB — D-DIMER, QUANTITATIVE: D-Dimer, Quant: 0.66 ug/mL-FEU — ABNORMAL HIGH (ref 0.00–0.50)

## 2017-06-20 NOTE — ED Triage Notes (Signed)
Pt states she has bilateral leg pain from the knee to the ankle. Pt states she had recent pneumonia and has been sedentary. Pt concerned she could have blood clot, no hx, no oral contraceptive.

## 2017-06-20 NOTE — Progress Notes (Signed)
Bilateral lower extremity venous duplex has been completed. Negative for DVT. Results were given to Dr. Alvino Chapel.  06/20/17 2:07 PM Krystal Short RVT

## 2017-06-20 NOTE — ED Notes (Signed)
This tech attempted to collect blood work x2 unsuccessfully. Phlebotomy made aware.

## 2017-06-20 NOTE — Discharge Instructions (Addendum)
Continue current medications.   Schedule to see Dr. Delfina Redwood for recheck.   Follow up in the ID clinic for evaluation.  Try ibuprofen for muscle cramps.  Drink plenty of fluids.

## 2017-06-21 NOTE — ED Provider Notes (Signed)
[place] EMERGENCY DEPARTMENT Provider Note   CSN: [id number] Arrival date & time: 06/20/17  2409     History   Chief Complaint Chief Complaint  Patient presents with  . Leg Pain    HPI Krystal Short is a 32 y.o. female.  The history is provided by the patient. No language interpreter was used.  Leg Pain   This is a new problem. The current episode started yesterday. The problem occurs constantly. The pain is present in the right lower leg. The quality of the pain is described as aching. The pain is moderate. Associated symptoms include numbness. Pertinent negatives include full range of motion. The symptoms are aggravated by activity. She has tried nothing for the symptoms. The treatment provided no relief. There has been no history of extremity trauma.  Pt reports she had same in left leg yesterday.  Pt is sorried about having a blood clot.  Pt recently diagnosed with HIV/Aids.    Past Medical History:  Diagnosis Date  . Pneumonia 06/06/2017  . UTI (urinary tract infection)     Patient Active Problem List   Diagnosis Date Noted  . AIDS ([place])   . 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 ([place])   . Thrush of mouth and esophagus ([place])   . Community acquired pneumonia 06/05/2017  . CAP (community acquired pneumonia) 06/03/2017  . Pleurisy 06/03/2017  . Tachycardia 06/03/2017  . Facial dermatitis 06/03/2017  . Tobacco abuse 06/03/2017  . Acute hyponatremia 06/03/2017    Past Surgical History:  Procedure Laterality Date  . NO PAST SURGERIES       OB History   None      Home Medications    Prior to Admission medications   Medication Sig Start Date End Date Taking? Authorizing Provider  albuterol (PROVENTIL HFA;VENTOLIN HFA) 108 (90 Base) MCG/ACT inhaler Inhale 1-2 puffs into the lungs every 6 (six) hours as needed for wheezing or shortness of breath. 04/13/16   Noe Gens, PA-C  alum & mag hydroxide-simeth (MAALOX/MYLANTA)  200-200-20 MG/5ML suspension Take 30 mLs by mouth every 6 (six) hours as needed for indigestion or heartburn. 06/11/17   Hongalgi, Lenis Dickinson, MD  bictegravir-emtricitabine-tenofovir AF (BIKTARVY) 50-200-25 MG TABS tablet Take 1 tablet by mouth daily. 06/08/17   Elwyn Reach, MD  famotidine (PEPCID) 20 MG tablet Take 1 tablet (20 mg total) by mouth 2 (two) times daily. 06/11/17   Hongalgi, Lenis Dickinson, MD  guaiFENesin (MUCINEX PO) Take 1 tablet by mouth 2 (two) times daily as needed (cough/congestion).    [provider]  ibuprofen (ADVIL,MOTRIN) 200 MG tablet Take 400 mg by mouth every 6 (six) hours as needed for fever or headache (pain).    [provider]  predniSONE (DELTASONE) 20 MG tablet Take 2 tablets (40 mg total) by mouth daily with breakfast for 5 days, THEN 1 tablet (20 mg total) daily with breakfast for 11 days. 06/09/17 06/25/17  Elwyn Reach, MD  sulfamethoxazole-trimethoprim (BACTRIM DS,SEPTRA DS) 800-160 MG tablet Take 2 tablets by mouth every 8 (eight) hours for 17 days. 06/08/17 06/25/17  Elwyn Reach, MD    Family History History reviewed. No pertinent family history.  Social History Social History   Tobacco Use  . Smoking status: Former Smoker    Types: Cigarettes    Last attempt to quit: 04/24/2017    Years since quitting: 0.1  . Smokeless tobacco: Never Used  Substance Use Topics  . Alcohol use: Yes  .  Drug use: No     Allergies   Patient has no known allergies.   Review of Systems Review of Systems  Neurological: Positive for numbness.  All other systems reviewed and are negative.    Physical Exam Updated Vital Signs BP 105/79 (BP Location: Right Arm)   Pulse 91   Temp 97.8 F (36.6 C) (Oral)   Resp 20   Ht 5\' 1"  (1.549 m)   Wt 54.4 kg (120 lb)   LMP 05/30/2017   SpO2 100%   BMI 22.67 kg/m   Physical Exam  Constitutional: She appears well-developed and well-nourished.  HENT:  Head: Normocephalic and atraumatic.  Right Ear:  External ear normal.  Left Ear: External ear normal.  Nose: Nose normal.  Mouth/Throat: Oropharynx is clear and moist.  Eyes: Pupils are equal, round, and reactive to light.  Neck: Normal range of motion.  Cardiovascular: Normal rate.  Pulmonary/Chest: Effort normal.  Abdominal: Soft.  Musculoskeletal: Normal range of motion.  Neurological: She is alert.  Skin: Skin is warm.  Psychiatric: She has a normal mood and affect.  Nursing note and vitals reviewed.    ED Treatments / Results  Labs (all labs ordered are listed, but only abnormal results are displayed) Labs Reviewed  COMPREHENSIVE METABOLIC PANEL - Abnormal; Notable for the following components:      Result Value   Sodium 132 (*)    CO2 21 (*)    Creatinine, Ser 1.20 (*)    Calcium 8.4 (*)    Albumin 3.1 (*)    Total Bilirubin 0.2 (*)    GFR calc non Af Amer 60 (*)    All other components within normal limits  D-DIMER, QUANTITATIVE (NOT AT [facility]) - Abnormal; Notable for the following components:   D-Dimer, Quant 0.66 (*)    All other components within normal limits  CBC WITH DIFFERENTIAL/PLATELET    EKG None  Radiology No results found.  Procedures Procedures (including critical care time)  Medications Ordered in ED Medications - No data to display   Initial Impression / Assessment and Plan / ED Course  I have reviewed the triage vital signs and the nursing notes.  Pertinent labs & imaging results that were available during my care of the patient were reviewed by me and considered in my medical decision making (see chart for details).     MDM  Pt had elevated ddimer.  Doppler of bilat lower legs, no dvt.  Pt counseled on results.  Pt has an appointment in the ID clinic.  Pt plans to schedule to see Dr. Delfina Redwood. Final Clinical Impressions(s) / ED Diagnoses   Final diagnoses:  Bilateral leg pain    ED Discharge Orders    None    An After Visit Summary was printed and given to the patient.     Sidney Ace 06/21/17 1300    Davonna Belling, MD 06/25/17 [phone]

## 2017-06-26 ENCOUNTER — Encounter: Payer: Self-pay | Admitting: Family

## 2017-06-26 ENCOUNTER — Ambulatory Visit (INDEPENDENT_AMBULATORY_CARE_PROVIDER_SITE_OTHER): Payer: No Typology Code available for payment source | Admitting: Family

## 2017-06-26 VITALS — BP 109/84 | HR 93 | Temp 97.5°F | Ht 61.0 in | Wt 116.1 lb

## 2017-06-26 DIAGNOSIS — B081 Molluscum contagiosum: Secondary | ICD-10-CM | POA: Diagnosis not present

## 2017-06-26 DIAGNOSIS — B20 Human immunodeficiency virus [HIV] disease: Secondary | ICD-10-CM

## 2017-06-26 DIAGNOSIS — B59 Pneumocystosis: Secondary | ICD-10-CM

## 2017-06-26 MED ORDER — SULFAMETHOXAZOLE-TRIMETHOPRIM 800-160 MG PO TABS: 1.0000 | ORAL_TABLET | Freq: Every day | ORAL | 3 refills | Status: DC

## 2017-06-26 NOTE — Patient Instructions (Addendum)
Nice to see you.  Please continue to take your Biktarvy and Bactrim  Bactrim will change from 3x per day to 1x per day with the refill.   If the pain in legs worsen, please let us know.   They will call with a referral to dermatology.

## 2017-06-26 NOTE — Progress Notes (Signed)
Subjective:    Patient ID: [name], female    DOB: 02-Dec-1985, 32 y.o.   MRN: [id number]  Chief Complaint  Patient presents with  . Hospitalization Follow-up    HPI:  Maleaha Breiner is a 32 y.o. female who presents today for an initial office visit following hospitalization.  Ms. Gintz was evaluated in the emergency department and admitted to the hospital on 06/03/17 with the chief complaint of fever, cough, pneumonia. Symptoms had started approximately 3 weeks prior to presentation. She was treated previously by urgent care with amoxicillin with no symptom improvement. Chest x-ray showed low volumes with bilateral atelectasis or pneumonia. CT scan with no pulmonary emboli and widespread airspace opacities bilaterally with diffuse groundglass opacity. She was also noted to have oral lesions and given a mouthwash by her primary care provider. She was found to be positive for corona virus on viral screen and HIV 1 positive. Initially placed on ceftriaxone and azithromycin which was discontinued for Bactrim and prednisone. Found to have a CD4 count of 10. Diagnosed with PCP pneumonia and continued on Bactrim and prednisone for the remainder of hospitalization. She was also started on Biktarvy for her HIV disease. All hospital records, labs and imaging were reviewed in detail.  Since leaving the hospital she has had improvement in her symptoms although continues to experience the associated symptoms of fatigue. This is slowly improving. Describes getting easily fatigued with activities of daily living such as taking a shower or standing for periods of time. Denies fevers, chills, or sweats. Continues to take the Bactrim, prednisone and Biktarvy as prescribed with no adverse side effects. Notes the new associated symptom of waxing and waning sharp and shooting pains located in her bilateral lower extremities occurring a couple times per day and lasting for a few seconds to a couple minutes.  There are no triggers or movements that she is aware of. Denies any trauma or injury.   No Known Allergies    Outpatient Medications Prior to Visit  Medication Sig Dispense Refill  . albuterol (PROVENTIL HFA;VENTOLIN HFA) 108 (90 Base) MCG/ACT inhaler Inhale 1-2 puffs into the lungs every 6 (six) hours as needed for wheezing or shortness of breath. 1 Inhaler 0  . alum & mag hydroxide-simeth (MAALOX/MYLANTA) 200-200-20 MG/5ML suspension Take 30 mLs by mouth every 6 (six) hours as needed for indigestion or heartburn. 355 mL 0  . bictegravir-emtricitabine-tenofovir AF (BIKTARVY) 50-200-25 MG TABS tablet Take 1 tablet by mouth daily. 30 tablet 2  . famotidine (PEPCID) 20 MG tablet Take 1 tablet (20 mg total) by mouth 2 (two) times daily. 60 tablet 0  . guaiFENesin (MUCINEX PO) Take 1 tablet by mouth 2 (two) times daily as needed (cough/congestion).    Marland Kitchen ibuprofen (ADVIL,MOTRIN) 200 MG tablet Take 400 mg by mouth every 6 (six) hours as needed for fever or headache (pain).     No facility-administered medications prior to visit.      Past Medical History:  Diagnosis Date  . Pneumonia 06/06/2017  . UTI (urinary tract infection)       Past Surgical History:  Procedure Laterality Date  . NO PAST SURGERIES        No family history on file.    Social History   Socioeconomic History  . Marital status: Single    Spouse name: Not on file  . Number of children: Not on file  . Years of education: Not on file  . Highest education level: Not on file  Occupational  History  . Not on file  Social Needs  . Financial resource strain: Not on file  . Food insecurity:    Worry: Not on file    Inability: Not on file  . Transportation needs:    Medical: Not on file    Non-medical: Not on file  Tobacco Use  . Smoking status: Former Smoker    Types: Cigarettes    Last attempt to quit: 04/24/2017    Years since quitting: 0.1  . Smokeless tobacco: Never Used  Substance and Sexual  Activity  . Alcohol use: Yes  . Drug use: No  . Sexual activity: Yes    Birth control/protection: Condom  Lifestyle  . Physical activity:    Days per week: Not on file    Minutes per session: Not on file  . Stress: Not on file  Relationships  . Social connections:    Talks on phone: Not on file    Gets together: Not on file    Attends religious service: Not on file    Active member of club or organization: Not on file    Attends meetings of clubs or organizations: Not on file    Relationship status: Not on file  . Intimate partner violence:    Fear of current or ex partner: Not on file    Emotionally abused: Not on file    Physically abused: Not on file    Forced sexual activity: Not on file  Other Topics Concern  . Not on file  Social History Narrative  . Not on file      Review of Systems  Constitutional: Positive for fatigue. Negative for activity change, appetite change, diaphoresis, fever and unexpected weight change.  HENT: Negative for congestion, sinus pressure and sore throat.   Respiratory: Positive for cough. Negative for chest tightness, shortness of breath and wheezing.   Cardiovascular: Negative for chest pain and leg swelling.  Gastrointestinal: Negative for abdominal pain, constipation, diarrhea, nausea and vomiting.  Genitourinary: Negative for dysuria, flank pain, frequency, genital sores, hematuria and urgency.  Neurological: Negative for weakness and headaches.       Objective:    BP 109/84   Pulse 93   Temp (!) 97.5 F (36.4 C)   Ht 5\' 1"  (1.549 m)   Wt 116 lb 1.9 oz (52.7 kg)   LMP 05/30/2017   SpO2 100%   BMI 21.94 kg/m  Nursing note and vital signs reviewed.  Physical Exam  Constitutional: She is oriented to person, place, and time. She appears well-developed and well-nourished. No distress.  HENT:  Mouth/Throat: Oropharynx is clear and moist.  Eyes: Conjunctivae are normal.  Neck: Neck supple.  Cardiovascular: Normal rate, regular  rhythm, normal heart sounds and intact distal pulses. Exam reveals no gallop and no friction rub.  No murmur heard. Pulmonary/Chest: Effort normal and breath sounds normal. No respiratory distress. She has no wheezes. She has no rales. She exhibits no tenderness.  Abdominal: Soft. Bowel sounds are normal. There is no tenderness.  Lymphadenopathy:    She has no cervical adenopathy.  Neurological: She is alert and oriented to person, place, and time.  Skin: Skin is warm and dry. Rash (Located primarily on her face with apparent vesicles that are skin colored with umbilication) noted. She is not diaphoretic.  Psychiatric: She has a normal mood and affect. Her behavior is normal.       Assessment & Plan:   Problem List Items Addressed This Visit  Respiratory   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 ([place]) - Primary    Appears improved with Bactrim and prednisone treatments. Continues to have some fatigue although is improving. Complete Bactrim 3 times daily as prescribed with change to once daily at completion for PCP prophylaxis. Complete prednisone as prescribed. No additional treatment is necessary at this time. Follow-up if symptoms return or worsen.      Relevant Medications   sulfamethoxazole-trimethoprim (BACTRIM DS,SEPTRA DS) 800-160 MG tablet     Musculoskeletal and Integument   Molluscum contagiosum    Facial rash appears consistent with molluscum contagiosum most likely related to low CD4 count. Referral placed to dermatology for further assessment and treatment as indicated.      Relevant Medications   sulfamethoxazole-trimethoprim (BACTRIM DS,SEPTRA DS) 800-160 MG tablet   Other Relevant Orders   Ambulatory referral to Dermatology     Other   AIDS [facility])    Ms. Lover is newly diagnosed with HIV/AIDs with most recent CD4 count of 10. Her initial viral load remains pending at this time. She is tolerating the [place] with no adverse side effects. She was  negative for gonorrhea, chlamydia, and syphilis. She was not immune to Hepatitis B. Will recheck HIV viral load and CD4 count today. Will continue on Bactrim for PCP prophylaxis following completion of treatment dosage. Continue current dose of Biktarvy. Declines condoms. Husband is now on PrEP. Plan to follow up in 1 month or sooner if needed. Will plan for Hepatitis B series as well as Prevnar at next office visit.       Relevant Medications   sulfamethoxazole-trimethoprim (BACTRIM DS,SEPTRA DS) 800-160 MG tablet   Other Relevant Orders   T-helper cell (CD4)- ([facility] clinic only)   HIV 1 RNA quant-no reflex-bld       I am having Triva Ruz start on sulfamethoxazole-trimethoprim. I am also having her maintain her albuterol, ibuprofen, guaiFENesin (MUCINEX PO), bictegravir-emtricitabine-tenofovir AF, alum & mag hydroxide-simeth, and famotidine.   Meds ordered this encounter  Medications  . sulfamethoxazole-trimethoprim (BACTRIM DS,SEPTRA DS) 800-160 MG tablet    Sig: Take 1 tablet by mouth daily. Start at completion of other prescription.    Dispense:  30 tablet    Refill:  3    Order Specific Question:   Supervising Provider    Answer:   Carlyle Basques [[id number]]     Follow-up: Return in about 1 month (around 07/27/2017).  Mauricio Po, [place] for Infectious Disease

## 2017-06-27 ENCOUNTER — Telehealth: Payer: Self-pay

## 2017-06-27 ENCOUNTER — Telehealth: Payer: Self-pay | Admitting: Behavioral Health

## 2017-06-27 ENCOUNTER — Encounter: Payer: Self-pay | Admitting: Family

## 2017-06-27 DIAGNOSIS — B081 Molluscum contagiosum: Secondary | ICD-10-CM | POA: Insufficient documentation

## 2017-06-27 LAB — REFLEX TO GENOSURE(R) MG EDI: HIV GenoSure(R): 1

## 2017-06-27 LAB — HIV GENOSURE(R) MG

## 2017-06-27 LAB — T-HELPER CELL (CD4) - (RCID CLINIC ONLY)
CD4 T CELL HELPER: 3 % — AB (ref 33–55)
CD4 T Cell Abs: 40 /uL — ABNORMAL LOW (ref 400–2700)

## 2017-06-27 LAB — HIV-1 RNA, PCR (GRAPH) RFX/GENO EDI
HIV-1 RNA BY PCR: 344000 copies/mL
HIV-1 RNA QUANT, LOG: 5.537 {Log_copies}/mL

## 2017-06-27 NOTE — Telephone Encounter (Signed)
Paperwork completed and returned to [facility] who copied the paperwork, faxed it and has a copy available for pick up.

## 2017-06-27 NOTE — Telephone Encounter (Signed)
Pt came into clinic today for Terri Piedra, np to sign some FMLA papers. Pt would like for [place] to fax the paper work once it has been completed and signed to the fax number listed. PT would also like a call back to get a copy of the paper work to keep in her records. Will inform Marya Amsler that the paper work is ready for him to sign in triage. [place]

## 2017-06-27 NOTE — Assessment & Plan Note (Signed)
Facial rash appears consistent with molluscum contagiosum most likely related to low CD4 count. Referral placed to dermatology for further assessment and treatment as indicated.

## 2017-06-27 NOTE — Telephone Encounter (Signed)
Patient's FMLA paperwork faxed to [employer] [phone].  Also a copy placed to be scanned in Epic, and patient copy ready for pick up. Pricilla Riffle RN

## 2017-06-27 NOTE — Telephone Encounter (Signed)
Thank you :)

## 2017-06-27 NOTE — Assessment & Plan Note (Signed)
Krystal Short is newly diagnosed with HIV/AIDs with most recent CD4 count of 10. Her initial viral load remains pending at this time. She is tolerating the [place] with no adverse side effects. She was negative for gonorrhea, chlamydia, and syphilis. She was not immune to Hepatitis B. Will recheck HIV viral load and CD4 count today. Will continue on Bactrim for PCP prophylaxis following completion of treatment dosage. Continue current dose of Biktarvy. Declines condoms. Husband is now on PrEP. Plan to follow up in 1 month or sooner if needed. Will plan for Hepatitis B series as well as Prevnar at next office visit.

## 2017-06-27 NOTE — Assessment & Plan Note (Signed)
Appears improved with Bactrim and prednisone treatments. Continues to have some fatigue although is improving. Complete Bactrim 3 times daily as prescribed with change to once daily at completion for PCP prophylaxis. Complete prednisone as prescribed. No additional treatment is necessary at this time. Follow-up if symptoms return or worsen.

## 2017-06-28 LAB — HIV-1 RNA QUANT-NO REFLEX-BLD
HIV 1 RNA QUANT: 124 {copies}/mL — AB
HIV-1 RNA Quant, Log: 2.09 Log copies/mL — ABNORMAL HIGH

## 2017-06-29 ENCOUNTER — Encounter (INDEPENDENT_AMBULATORY_CARE_PROVIDER_SITE_OTHER): Payer: Self-pay

## 2017-07-09 ENCOUNTER — Encounter: Payer: Self-pay | Admitting: *Deleted

## 2017-07-12 MED FILL — BIKTARVY 50-200-25 MG TABS: 50-200-25 | 30 days supply | Qty: 30 | Fill #1

## 2017-08-06 ENCOUNTER — Other Ambulatory Visit: Payer: Self-pay | Admitting: Nurse Practitioner

## 2017-08-06 DIAGNOSIS — N63 Unspecified lump in unspecified breast: Secondary | ICD-10-CM

## 2017-08-08 ENCOUNTER — Other Ambulatory Visit: Payer: No Typology Code available for payment source

## 2017-08-10 MED FILL — BIKTARVY 50-200-25 MG TABS: 50-200-25 | 30 days supply | Qty: 30 | Fill #2

## 2017-08-15 ENCOUNTER — Telehealth: Payer: Self-pay

## 2017-08-15 ENCOUNTER — Encounter: Payer: Self-pay | Admitting: Family

## 2017-08-15 ENCOUNTER — Ambulatory Visit
Admission: RE | Admit: 2017-08-15 | Discharge: 2017-08-15 | Disposition: A | Discharge status: Home / Self Care | Payer: No Typology Code available for payment source | Source: Ambulatory Visit | Attending: Family | Admitting: Family

## 2017-08-15 ENCOUNTER — Ambulatory Visit
Admission: RE | Admit: 2017-08-15 | Discharge: 2017-08-15 | Disposition: A | Discharge status: Home / Self Care | Payer: No Typology Code available for payment source | Source: Ambulatory Visit | Attending: Nurse Practitioner | Admitting: Nurse Practitioner

## 2017-08-15 ENCOUNTER — Ambulatory Visit (INDEPENDENT_AMBULATORY_CARE_PROVIDER_SITE_OTHER): Payer: No Typology Code available for payment source | Admitting: Family

## 2017-08-15 VITALS — BP 118/82 | HR 103 | Temp 97.6°F | Resp 18 | Ht 61.0 in | Wt 120.0 lb

## 2017-08-15 DIAGNOSIS — R05 Cough: Secondary | ICD-10-CM | POA: Insufficient documentation

## 2017-08-15 DIAGNOSIS — N63 Unspecified lump in unspecified breast: Secondary | ICD-10-CM

## 2017-08-15 DIAGNOSIS — R059 Cough, unspecified: Secondary | ICD-10-CM

## 2017-08-15 DIAGNOSIS — B20 Human immunodeficiency virus [HIV] disease: Secondary | ICD-10-CM | POA: Diagnosis not present

## 2017-08-15 DIAGNOSIS — B081 Molluscum contagiosum: Secondary | ICD-10-CM | POA: Diagnosis not present

## 2017-08-15 MED ORDER — AZITHROMYCIN 600 MG PO TABS
1200.0000 mg | ORAL_TABLET | ORAL | 0 refills | Status: DC
Start: 2017-08-15 — End: 2017-09-27

## 2017-08-15 MED ORDER — AZITHROMYCIN 600 MG PO TABS: 1200.0000 mg | ORAL_TABLET | Freq: Every day | ORAL | 0 refills | Status: DC

## 2017-08-15 NOTE — Assessment & Plan Note (Signed)
Symptoms and exam appear consistent with viral infection as she seems improved since initial onset with the exception of the dizziness. She remains on Biktarvy and Bactrim following PCP about 2 months ago. There is concern for potential of opportunistic infection given her most recent CD4 count of 40 previously. Will start her on weekly azithromycin and obtain a chest x-ray. Recommend continued Biktarvy and Bactrim. OTC medications as needed for symptom relief. Follow up pending results of labs and imaging.

## 2017-08-15 NOTE — Assessment & Plan Note (Signed)
Awaiting dermatology referral. Will check status. New referral placed if needed.

## 2017-08-15 NOTE — Progress Notes (Signed)
Subjective:    Patient ID: Krystal Short, female    DOB: 12-18-1985, 32 y.o.   MRN: [id number]  Chief Complaint  Patient presents with  . sick visit    non productive cough, dizziness, SOB     HPI:  Krystal Short is a 32 y.o. female who presents today for an acute office visit.  She recently returned to work and felt okay, however on Monday she began experiencing the associated symptoms of shortness of breath, cough, and dizziness. Felt like she may have had a fever on Monday. She has noted some improvements and is a little dizzy presently. She continues to take her Biktarvy and Bactrim. She was most recently treated for pneumonia. She is drinking about 64 ounces of water. Dizzy spells occur 2-3 times per week.   No Known Allergies   Outpatient Medications Prior to Visit  Medication Sig Dispense Refill  . bictegravir-emtricitabine-tenofovir AF (BIKTARVY) 50-200-25 MG TABS tablet Take 1 tablet by mouth daily. 30 tablet 2  . ibuprofen (ADVIL,MOTRIN) 200 MG tablet Take 400 mg by mouth every 6 (six) hours as needed for fever or headache (pain).    Marland Kitchen sulfamethoxazole-trimethoprim (BACTRIM DS,SEPTRA DS) 800-160 MG tablet Take 1 tablet by mouth daily. Start at completion of other prescription. 30 tablet 3  . albuterol (PROVENTIL HFA;VENTOLIN HFA) 108 (90 Base) MCG/ACT inhaler Inhale 1-2 puffs into the lungs every 6 (six) hours as needed for wheezing or shortness of breath. (Patient not taking: Reported on 08/15/2017) 1 Inhaler 0  . alum & mag hydroxide-simeth (MAALOX/MYLANTA) 200-200-20 MG/5ML suspension Take 30 mLs by mouth every 6 (six) hours as needed for indigestion or heartburn. (Patient not taking: Reported on 08/15/2017) 355 mL 0  . famotidine (PEPCID) 20 MG tablet Take 1 tablet (20 mg total) by mouth 2 (two) times daily. (Patient not taking: Reported on 08/15/2017) 60 tablet 0  . guaiFENesin (MUCINEX PO) Take 1 tablet by mouth 2 (two) times daily as needed (cough/congestion).     No  facility-administered medications prior to visit.      Past Medical History:  Diagnosis Date  . Pneumonia 06/06/2017  . UTI (urinary tract infection)      Past Surgical History:  Procedure Laterality Date  . NO PAST SURGERIES         Review of Systems  Constitutional: Positive for fever. Negative for chills and fatigue.  HENT: Negative for congestion.   Eyes:       Denies changes in vision  Respiratory: Positive for cough and shortness of breath. Negative for chest tightness and wheezing.   Cardiovascular: Negative for chest pain and leg swelling.  Neurological: Positive for dizziness. Negative for weakness and headaches.      Objective:    BP 118/82   Pulse (!) 103   Temp 97.6 F (36.4 C) (Oral)   Resp 18   Ht 5' 1"  (1.549 m)   Wt 120 lb (54.4 kg)   LMP 08/03/2017 (Exact Date)   SpO2 100%   BMI 22.67 kg/m  Nursing note and vital signs reviewed.  Physical Exam  Constitutional: She is oriented to person, place, and time. She appears well-developed and well-nourished. No distress.  HENT:  Right Ear: Hearing, tympanic membrane, external ear and ear canal normal.  Left Ear: Hearing, tympanic membrane, external ear and ear canal normal.  Nose: Right sinus exhibits no maxillary sinus tenderness and no frontal sinus tenderness. Left sinus exhibits no maxillary sinus tenderness and no frontal sinus tenderness.  Mouth/Throat: Uvula  is midline, oropharynx is clear and moist and mucous membranes are normal.  Cardiovascular: Normal rate, regular rhythm, normal heart sounds and intact distal pulses.  Pulmonary/Chest: Effort normal and breath sounds normal.  Neurological: She is alert and oriented to person, place, and time.  Skin: Skin is warm and dry.  Psychiatric: She has a normal mood and affect. Her behavior is normal. Judgment and thought content normal.       Assessment & Plan:   Problem List Items Addressed This Visit      Musculoskeletal and Integument    Molluscum contagiosum    Awaiting dermatology referral. Will check status. New referral placed if needed.       Relevant Medications   azithromycin (ZITHROMAX) 600 MG tablet   Other Relevant Orders   Ambulatory referral to Dermatology     Other   AIDS ([place])    Most recent CD4 count of 40 with viral load of 120. She reports adherence to [facility] and Bactrim. There is concern for opportunistic infection and will check Crypto antigen, Toxoplasma IGG/IGM, Urine histoplasmosis, HIV viral load and CD4. Added azithromycin for MAC prevention. Continue current dosage of Bactrim and Biktarvy.  Follow up pending lab work results.       Relevant Medications   azithromycin (ZITHROMAX) 600 MG tablet   Other Relevant Orders   HIV 1 RNA quant-no reflex-bld   T-helper cell (CD4)- ([facility] clinic only)   Cryptococcal antigen   Toxoplasma antibodies- IgG and  IgM   Histoplasma Antigen, Urine   CBC   Comprehensive metabolic panel   Cough - Primary    Symptoms and exam appear consistent with viral infection as she seems improved since initial onset with the exception of the dizziness. She remains on Biktarvy and Bactrim following PCP about 2 months ago. There is concern for potential of opportunistic infection given her most recent CD4 count of 40 previously. Will start her on weekly azithromycin and obtain a chest x-ray. Recommend continued Biktarvy and Bactrim. OTC medications as needed for symptom relief. Follow up pending results of labs and imaging.       Relevant Orders   Cryptococcal antigen   Toxoplasma antibodies- IgG and  IgM   Histoplasma Antigen, Urine   CBC   Comprehensive metabolic panel   DG Chest 2 View       I have changed Leonarda Eslick's azithromycin. I am also having her maintain her albuterol, ibuprofen, guaiFENesin (MUCINEX PO), bictegravir-emtricitabine-tenofovir AF, alum & mag hydroxide-simeth, famotidine, and sulfamethoxazole-trimethoprim.   Meds ordered this encounter    Medications  . DISCONTD: azithromycin (ZITHROMAX) 600 MG tablet    Sig: Take 2 tablets (1,200 mg total) by mouth daily.    Dispense:  8 tablet    Refill:  0    Order Specific Question:   Supervising Provider    Answer:   Carlyle Basques [[id number]]  . azithromycin (ZITHROMAX) 600 MG tablet    Sig: Take 2 tablets (1,200 mg total) by mouth once a week.    Dispense:  8 tablet    Refill:  0    Please cancel previous order for azithromycin    Order Specific Question:   Supervising Provider    Answer:   Carlyle Basques [[id number]]     Follow-up: Return in about 1 month (around 09/15/2017), or if symptoms worsen or fail to improve.   Terri Piedra, MSN, [facility] for Infectious Disease

## 2017-08-15 NOTE — Assessment & Plan Note (Signed)
Most recent CD4 count of 40 with viral load of 120. She reports adherence to [facility] [address] and Bactrim. There is concern for opportunistic infection and will check Crypto antigen, Toxoplasma IGG/IGM, Urine histoplasmosis, HIV viral load and CD4. Added azithromycin for MAC prevention. Continue current dosage of Bactrim and Biktarvy.  Follow up pending lab work results.

## 2017-08-15 NOTE — Patient Instructions (Signed)
Nice to see you.  We will check your lab work and x-ray today.   Please start taking azithromycin.  Continue to take Biktarvy and Bactrim as prescribed.   Additional treatment pending results.

## 2017-08-15 NOTE — Telephone Encounter (Signed)
Patient walked into office with complaint of cough and shortness of breath.  She returned to work last week and has feeling exhausted.  Would like to return to work with reduced hours.  Office visit suggested.  Will see Marya Amsler, NP this evening.   Laverle Patter, RN

## 2017-08-16 LAB — T-HELPER CELL (CD4) - (RCID CLINIC ONLY)
CD4 % Helper T Cell: 3 % — ABNORMAL LOW (ref 33–55)
CD4 T CELL ABS: 40 /uL — AB (ref 400–2700)

## 2017-08-17 ENCOUNTER — Encounter (INDEPENDENT_AMBULATORY_CARE_PROVIDER_SITE_OTHER): Payer: Self-pay

## 2017-08-17 LAB — CBC
HEMATOCRIT: 27.5 % — AB (ref 35.0–45.0)
Hemoglobin: 9.6 g/dL — ABNORMAL LOW (ref 11.7–15.5)
MCH: 33.8 pg — ABNORMAL HIGH (ref 27.0–33.0)
MCHC: 34.9 g/dL (ref 32.0–36.0)
MCV: 96.8 fL (ref 80.0–100.0)
MPV: 9.6 fL (ref 7.5–12.5)
PLATELETS: 263 10*3/uL (ref 140–400)
RBC: 2.84 10*6/uL — AB (ref 3.80–5.10)
RDW: 16.9 % — AB (ref 11.0–15.0)
WBC: 5.1 10*3/uL (ref 3.8–10.8)

## 2017-08-17 LAB — COMPREHENSIVE METABOLIC PANEL
AG Ratio: 0.8 (calc) — ABNORMAL LOW (ref 1.0–2.5)
ALKALINE PHOSPHATASE (APISO): 57 U/L (ref 33–115)
ALT: 7 U/L (ref 6–29)
AST: 22 U/L (ref 10–30)
Albumin: 3.5 g/dL — ABNORMAL LOW (ref 3.6–5.1)
BILIRUBIN TOTAL: 0.3 mg/dL (ref 0.2–1.2)
BUN: 7 mg/dL (ref 7–25)
CALCIUM: 8.1 mg/dL — AB (ref 8.6–10.2)
CO2: 26 mmol/L (ref 20–32)
Chloride: 102 mmol/L (ref 98–110)
Creat: 0.75 mg/dL (ref 0.50–1.10)
Globulin: 4.2 g/dL (calc) — ABNORMAL HIGH (ref 1.9–3.7)
Glucose, Bld: 99 mg/dL (ref 65–99)
POTASSIUM: 3.6 mmol/L (ref 3.5–5.3)
Sodium: 135 mmol/L (ref 135–146)
Total Protein: 7.7 g/dL (ref 6.1–8.1)

## 2017-08-17 LAB — TOXOPLASMA ANTIBODIES- IGG AND  IGM: Toxoplasma IgG Ratio: <7.2 IU/mL

## 2017-08-17 LAB — HIV-1 RNA QUANT-NO REFLEX-BLD
HIV 1 RNA QUANT: 60 {copies}/mL — AB
HIV-1 RNA Quant, Log: 1.78 Log copies/mL — ABNORMAL HIGH

## 2017-08-18 LAB — HISTOPLASMA ANTIGEN, URINE

## 2017-08-20 ENCOUNTER — Encounter (INDEPENDENT_AMBULATORY_CARE_PROVIDER_SITE_OTHER): Payer: Self-pay

## 2017-09-13 ENCOUNTER — Telehealth: Payer: Self-pay

## 2017-09-13 ENCOUNTER — Other Ambulatory Visit: Payer: No Typology Code available for payment source

## 2017-09-13 ENCOUNTER — Other Ambulatory Visit: Payer: Self-pay

## 2017-09-13 DIAGNOSIS — B20 Human immunodeficiency virus [HIV] disease: Secondary | ICD-10-CM

## 2017-09-13 MED ORDER — BICTEGRAVIR-EMTRICITAB-TENOFOV 50-200-25 MG PO TABS: 1.0000 | ORAL_TABLET | Freq: Every day | ORAL | 3 refills | Status: DC

## 2017-09-13 MED FILL — BIKTARVY 50-200-25 MG TABS: 50-200-25 | 30 days supply | Qty: 30 | Fill #0

## 2017-09-13 NOTE — Telephone Encounter (Addendum)
Patient walked into office for refill of Biktarvy sent to [facility] outpatient pharmacy. Refill sent they have it in stock for pickup today. Patient needed labs and appointment for one month follow- up per last office note. Orders placed, lab collected, appointments scheduled. [place]

## 2017-09-14 LAB — T-HELPER CELL (CD4) - (RCID CLINIC ONLY)
CD4 % Helper T Cell: 6 % — ABNORMAL LOW (ref 33–55)
CD4 T CELL ABS: 70 /uL — AB (ref 400–2700)

## 2017-09-17 LAB — CBC WITH DIFFERENTIAL/PLATELET
BASOS ABS: 20 {cells}/uL (ref 0–200)
Basophils Relative: 0.4 %
Eosinophils Absolute: 41 cells/uL (ref 15–500)
Eosinophils Relative: 0.8 %
HEMATOCRIT: 28.7 % — AB (ref 35.0–45.0)
Hemoglobin: 9.9 g/dL — ABNORMAL LOW (ref 11.7–15.5)
LYMPHS ABS: 1010 {cells}/uL (ref 850–3900)
MCH: 34.3 pg — ABNORMAL HIGH (ref 27.0–33.0)
MCHC: 34.5 g/dL (ref 32.0–36.0)
MCV: 99.3 fL (ref 80.0–100.0)
MPV: 10.1 fL (ref 7.5–12.5)
Monocytes Relative: 15.1 %
NEUTROS PCT: 63.9 %
Neutro Abs: 3259 cells/uL (ref 1500–7800)
PLATELETS: 267 10*3/uL (ref 140–400)
RBC: 2.89 10*6/uL — AB (ref 3.80–5.10)
RDW: 13.6 % (ref 11.0–15.0)
TOTAL LYMPHOCYTE: 19.8 %
WBC: 5.1 10*3/uL (ref 3.8–10.8)
WBCMIX: 770 {cells}/uL (ref 200–950)

## 2017-09-17 LAB — COMPLETE METABOLIC PANEL WITH GFR
AG Ratio: 0.9 (calc) — ABNORMAL LOW (ref 1.0–2.5)
ALBUMIN MSPROF: 4.1 g/dL (ref 3.6–5.1)
ALKALINE PHOSPHATASE (APISO): 61 U/L (ref 33–115)
ALT: 8 U/L (ref 6–29)
AST: 23 U/L (ref 10–30)
BUN: 12 mg/dL (ref 7–25)
CALCIUM: 9.1 mg/dL (ref 8.6–10.2)
CO2: 25 mmol/L (ref 20–32)
CREATININE: 0.66 mg/dL (ref 0.50–1.10)
Chloride: 101 mmol/L (ref 98–110)
GFR, EST AFRICAN AMERICAN: 137 mL/min/{1.73_m2} (ref >=60)
GFR, EST NON AFRICAN AMERICAN: 119 mL/min/{1.73_m2} (ref >=60)
GLOBULIN: 4.8 g/dL — AB (ref 1.9–3.7)
GLUCOSE: 94 mg/dL (ref 65–99)
Potassium: 4.1 mmol/L (ref 3.5–5.3)
SODIUM: 130 mmol/L — AB (ref 135–146)
TOTAL PROTEIN: 8.9 g/dL — AB (ref 6.1–8.1)
Total Bilirubin: 0.5 mg/dL (ref 0.2–1.2)

## 2017-09-17 LAB — HIV-1 RNA QUANT-NO REFLEX-BLD
HIV 1 RNA Quant: 40 copies/mL — ABNORMAL HIGH
HIV-1 RNA Quant, Log: 1.6 Log copies/mL — ABNORMAL HIGH

## 2017-09-27 ENCOUNTER — Ambulatory Visit (INDEPENDENT_AMBULATORY_CARE_PROVIDER_SITE_OTHER): Payer: No Typology Code available for payment source | Admitting: Family

## 2017-09-27 ENCOUNTER — Encounter: Payer: Self-pay | Admitting: Family

## 2017-09-27 VITALS — BP 109/72 | HR 92 | Temp 98.3°F | Wt 126.8 lb

## 2017-09-27 DIAGNOSIS — Z23 Encounter for immunization: Secondary | ICD-10-CM | POA: Diagnosis not present

## 2017-09-27 DIAGNOSIS — Z01419 Encounter for gynecological examination (general) (routine) without abnormal findings: Secondary | ICD-10-CM

## 2017-09-27 DIAGNOSIS — B20 Human immunodeficiency virus [HIV] disease: Secondary | ICD-10-CM

## 2017-09-27 MED ORDER — BICTEGRAVIR-EMTRICITAB-TENOFOV 50-200-25 MG PO TABS: 1.0000 | ORAL_TABLET | Freq: Every day | ORAL | 3 refills | Status: DC

## 2017-09-27 MED ORDER — SULFAMETHOXAZOLE-TRIMETHOPRIM 800-160 MG PO TABS: 1.0000 | ORAL_TABLET | Freq: Every day | ORAL | 3 refills | Status: DC

## 2017-09-27 MED FILL — SULFAMETHOXAZOLE-TMP DS TAB: 800-160 | 30 days supply | Qty: 30 | Fill #0

## 2017-09-27 NOTE — Patient Instructions (Signed)
Nice to see you.  Continue to take your Biktarvy and Bactrim.  Schedule a nurse visit in a month for a flu shot.   Follow up office visit in 3 months or sooner if needed with lab work 1-2 weeks prior to your appointment.  Please check on your vaccinations.

## 2017-09-27 NOTE — Progress Notes (Signed)
Subjective:    Patient ID: Krystal Short, female    DOB: 09/24/1985, 32 y.o.   MRN: [id number]  Chief Complaint  Patient presents with  . HIV Positive/AIDS     HPI:  Krystal Short is a 32 y.o. female who presents today for routine follow up of HIV disease.   Ms. Kotowski was last seen in the office on 06/26/17 following initial hospitalization for PCP pneumonia and newly diagnosed HIV. Her initial viral load was 344,000 with a CD4 count of 10 . She was started on Biktarvy and Bactrim in the hospital. She followed up with continued cough on 08/15/17 with a CD4 count of 40 and viral load 124.  She recently completed blood work on 09/13/17 showing a viral load of 40 and CD4 count of 70.   Ms. Burd continues to take her Biktarvy and Bactrim as prescribed with occasional nausea in the afternoons that generally waxes and wanes. Severity of the symptoms is mild. She has not missed any doses and has no problems obtaining her medication from the [facility] Denies fevers, chills, night sweats, headaches, changes in vision, neck pain/stiffness, nausea, diarrhea, vomiting, lesions or rashes.    No Known Allergies    Outpatient Medications Prior to Visit  Medication Sig Dispense Refill  . albuterol (PROVENTIL HFA;VENTOLIN HFA) 108 (90 Base) MCG/ACT inhaler Inhale 1-2 puffs into the lungs every 6 (six) hours as needed for wheezing or shortness of breath. 1 Inhaler 0  . bictegravir-emtricitabine-tenofovir AF (BIKTARVY) 50-200-25 MG TABS tablet Take 1 tablet by mouth daily. 30 tablet 3  . sulfamethoxazole-trimethoprim (BACTRIM DS,SEPTRA DS) 800-160 MG tablet Take 1 tablet by mouth daily. Start at completion of other prescription. 30 tablet 3  . alum & mag hydroxide-simeth (MAALOX/MYLANTA) 200-200-20 MG/5ML suspension Take 30 mLs by mouth every 6 (six) hours as needed for indigestion or heartburn. (Patient not taking: Reported on 09/27/2017) 355 mL 0  . famotidine (PEPCID) 20 MG  tablet Take 1 tablet (20 mg total) by mouth 2 (two) times daily. (Patient not taking: Reported on 08/15/2017) 60 tablet 0  . guaiFENesin (MUCINEX PO) Take 1 tablet by mouth 2 (two) times daily as needed (cough/congestion).    Marland Kitchen ibuprofen (ADVIL,MOTRIN) 200 MG tablet Take 400 mg by mouth every 6 (six) hours as needed for fever or headache (pain).    Marland Kitchen azithromycin (ZITHROMAX) 600 MG tablet Take 2 tablets (1,200 mg total) by mouth once a week. (Patient not taking: Reported on 09/27/2017) 8 tablet 0   No facility-administered medications prior to visit.      Past Medical History:  Diagnosis Date  . Pneumonia 06/06/2017  . UTI (urinary tract infection)      Past Surgical History:  Procedure Laterality Date  . NO PAST SURGERIES         Review of Systems  Constitutional: Negative for appetite change, chills, diaphoresis, fatigue, fever and unexpected weight change.  Eyes:       Negative for acute change in vision  Respiratory: Negative for chest tightness, shortness of breath and wheezing.   Cardiovascular: Negative for chest pain.  Gastrointestinal: Negative for diarrhea, nausea and vomiting.  Genitourinary: Negative for dysuria, pelvic pain and vaginal discharge.  Musculoskeletal: Negative for neck pain and neck stiffness.  Skin: Negative for rash.  Neurological: Negative for seizures, syncope, weakness and headaches.  Hematological: Negative for adenopathy. Does not bruise/bleed easily.  Psychiatric/Behavioral: Negative for hallucinations.      Objective:    BP 109/72  Pulse 92   Temp 98.3 F (36.8 C)   Wt 126 lb 12.8 oz (57.5 kg)   LMP 09/22/2017 (Exact Date)   BMI 23.96 kg/m  Nursing note and vital signs reviewed.  Physical Exam  Constitutional: She is oriented to person, place, and time. She appears well-developed. No distress.  HENT:  Mouth/Throat: Oropharynx is clear and moist.  Eyes: Conjunctivae are normal.  Neck: Neck supple.  Cardiovascular: Normal rate,  regular rhythm, normal heart sounds and intact distal pulses. Exam reveals no gallop and no friction rub.  No murmur heard. Pulmonary/Chest: Effort normal and breath sounds normal. No respiratory distress. She has no wheezes. She has no rales. She exhibits no tenderness.  Abdominal: Soft. Bowel sounds are normal. There is no tenderness.  Lymphadenopathy:    She has no cervical adenopathy.  Neurological: She is alert and oriented to person, place, and time.  Skin: Skin is warm and dry. No rash noted.  Psychiatric: She has a normal mood and affect. Her behavior is normal. Judgment and thought content normal.       Assessment & Plan:   Problem List Items Addressed This Visit      Other   AIDS ([place]) - Primary    Ms. Dattilio has improved viral load of 40 with a CD4 count of 70. She is adherent to her Biktarvy and Bactrim with minimal side effects and has no problems obtaining her medications. She has no signs/symptoms of opportunistic infection currently through history or physical exam. Menveo updated today. She will check on pneumococcal and HPV vaccinations. Continue current dose of Biktarvy and Bactrim. No indication for Azithromycin, so will discontinue. Follow up office visit in 3 months or sooner if needed with lab work 1-2 weeks prior to appointment. Recommend flu shot when available.       Relevant Medications   bictegravir-emtricitabine-tenofovir AF (BIKTARVY) 50-200-25 MG TABS tablet   sulfamethoxazole-trimethoprim (BACTRIM DS,SEPTRA DS) 800-160 MG tablet   Other Relevant Orders   T-helper cell (CD4)- ([facility] clinic only)   HIV 1 RNA quant-no reflex-bld   CBC   Comprehensive metabolic panel   MENINGOCOCCAL MCV4O (Completed)    Other Visit Diagnoses    Encounter for well woman exam       Relevant Orders   Ambulatory referral to Gynecology       I have discontinued Kirah Haralson's azithromycin. I am also having her maintain her albuterol, ibuprofen, guaiFENesin (MUCINEX  PO), alum & mag hydroxide-simeth, famotidine, bictegravir-emtricitabine-tenofovir AF, and sulfamethoxazole-trimethoprim.   Meds ordered this encounter  Medications  . bictegravir-emtricitabine-tenofovir AF (BIKTARVY) 50-200-25 MG TABS tablet    Sig: Take 1 tablet by mouth daily.    Dispense:  30 tablet    Refill:  3    Order Specific Question:   Supervising Provider    Answer:   Carlyle Basques [[id number]]  . sulfamethoxazole-trimethoprim (BACTRIM DS,SEPTRA DS) 800-160 MG tablet    Sig: Take 1 tablet by mouth daily. Start at completion of other prescription.    Dispense:  30 tablet    Refill:  3    Order Specific Question:   Supervising Provider    Answer:   Carlyle Basques [[id number]]     Follow-up: Return in about 3 months (around 12/28/2017), or if symptoms worsen or fail to improve.   Terri Piedra, MSN, FNP-C Nurse Practitioner [facility] [place] [facility] Office phone: [phone] Pager: [place] number: [phone]

## 2017-09-27 NOTE — Assessment & Plan Note (Signed)
Krystal Short has improved viral load of 40 with a CD4 count of 70. She is adherent to her Biktarvy and Bactrim with minimal side effects and has no problems obtaining her medications. She has no signs/symptoms of opportunistic infection currently through history or physical exam. Menveo updated today. She will check on pneumococcal and HPV vaccinations. Continue current dose of Biktarvy and Bactrim. No indication for Azithromycin, so will discontinue. Follow up office visit in 3 months or sooner if needed with lab work 1-2 weeks prior to appointment. Recommend flu shot when available.

## 2017-10-05 MED FILL — BIKTARVY 50-200-25 MG TABS: 50-200-25 | 30 days supply | Qty: 30 | Fill #0

## 2017-10-15 ENCOUNTER — Telehealth: Payer: Self-pay

## 2017-10-15 NOTE — Telephone Encounter (Signed)
Patient walked into office today to inform Terri Piedra, NP about some vaccine concerns. Patient would like to update Marya Amsler that she has received her prevnar 13 vaccine at her PCP office. Also that she has not received her HPV vaccine yet. Patient would like to have HPV vaccine done at next appointment 12/31/17. [place]

## 2017-10-15 NOTE — Telephone Encounter (Signed)
Noted. Thanks.

## 2017-10-15 NOTE — Telephone Encounter (Signed)
Called patient to find out when she had her last pap smear. Patient states it has been awhile since she had one done. Patient is waiting on a call from gynecology to schedule a pap smear.  [place]

## 2017-10-16 NOTE — Telephone Encounter (Signed)
Noted  

## 2017-10-25 ENCOUNTER — Ambulatory Visit (HOSPITAL_COMMUNITY)
Admission: EM | Admit: 2017-10-25 | Discharge: 2017-10-25 | Disposition: A | Discharge status: Home / Self Care | Payer: No Typology Code available for payment source | Attending: Family Medicine | Admitting: Family Medicine

## 2017-10-25 ENCOUNTER — Ambulatory Visit (INDEPENDENT_AMBULATORY_CARE_PROVIDER_SITE_OTHER): Payer: No Typology Code available for payment source

## 2017-10-25 ENCOUNTER — Other Ambulatory Visit: Payer: Self-pay

## 2017-10-25 ENCOUNTER — Encounter (HOSPITAL_COMMUNITY): Payer: Self-pay | Admitting: *Deleted

## 2017-10-25 DIAGNOSIS — J22 Unspecified acute lower respiratory infection: Secondary | ICD-10-CM

## 2017-10-25 DIAGNOSIS — R05 Cough: Secondary | ICD-10-CM | POA: Diagnosis not present

## 2017-10-25 HISTORY — DX: Asymptomatic human immunodeficiency virus (hiv) infection status: Z21

## 2017-10-25 HISTORY — DX: Human immunodeficiency virus (HIV) disease: B20

## 2017-10-25 MED ORDER — AZITHROMYCIN 250 MG PO TABS: 250.0000 mg | ORAL_TABLET | Freq: Every day | ORAL | 0 refills | Status: DC

## 2017-10-25 NOTE — Discharge Instructions (Signed)
Get plenty of rest and push fluids Use OTC medication as needed for symptomatic relief Take antibiotic as directed and to completion Continue with inhaler as needed for shortness of breath or wheezing Follow up with PCP if symptoms persist Return or go to ER if you have any new or worsening symptoms such as worsening cough, shortness of breath, chest pain, fever that does not moderate with treatment, worsening fatigue, night sweats, etc..Marland Kitchen

## 2017-10-25 NOTE — ED Provider Notes (Signed)
[place]   [id number] 10/25/17 Arrival Time: 1552  CC: cough   SUBJECTIVE:  Krystal Short is a 32 y.o. female hx significant for HIV, who presents with cough x 3 days ago.  Denies positive sick exposure or precipitating event.  Describes cough as constant and dry.  Has tried therflu with relief.  Denies worsening factors.  Reports previous symptoms in the past and diagnosed with pneumonia.  Complains of fever, chills, night sweats, fatigue, and rhinorrhea. Denies sinus pain, sore throat, SOB, wheezing, chest pain, nausea, changes in bowel or bladder habits.    ROS: As per HPI.  Past Medical History:  Diagnosis Date  . Pneumonia 06/06/2017  . UTI (urinary tract infection)    Past Surgical History:  Procedure Laterality Date  . NO PAST SURGERIES     No Known Allergies No current facility-administered medications on file prior to encounter.    Current Outpatient Medications on File Prior to Encounter  Medication Sig Dispense Refill  . albuterol (PROVENTIL HFA;VENTOLIN HFA) 108 (90 Base) MCG/ACT inhaler Inhale 1-2 puffs into the lungs every 6 (six) hours as needed for wheezing or shortness of breath. 1 Inhaler 0  . bictegravir-emtricitabine-tenofovir AF (BIKTARVY) 50-200-25 MG TABS tablet Take 1 tablet by mouth daily. 30 tablet 3  . guaiFENesin (MUCINEX PO) Take 1 tablet by mouth 2 (two) times daily as needed (cough/congestion).    Marland Kitchen ibuprofen (ADVIL,MOTRIN) 200 MG tablet Take 400 mg by mouth every 6 (six) hours as needed for fever or headache (pain).    Marland Kitchen sulfamethoxazole-trimethoprim (BACTRIM DS,SEPTRA DS) 800-160 MG tablet Take 1 tablet by mouth daily. Start at completion of other prescription. 30 tablet 3    Social History   Socioeconomic History  . Marital status: Married    Spouse name: Not on file  . Number of children: Not on file  . Years of education: Not on file  . Highest education level: Not on file  Occupational History  . Not on file  Social Needs    . Financial resource strain: Not on file  . Food insecurity:    Worry: Not on file    Inability: Not on file  . Transportation needs:    Medical: Not on file    Non-medical: Not on file  Tobacco Use  . Smoking status: Former Smoker    Types: Cigarettes    Last attempt to quit: 12/25/2016    Years since quitting: 0.8  . Smokeless tobacco: Never Used  Substance and Sexual Activity  . Alcohol use: Yes  . Drug use: No  . Sexual activity: Yes    Birth control/protection: Condom  Lifestyle  . Physical activity:    Days per week: Not on file    Minutes per session: Not on file  . Stress: Not on file  Relationships  . Social connections:    Talks on phone: Not on file    Gets together: Not on file    Attends religious service: Not on file    Active member of club or organization: Not on file    Attends meetings of clubs or organizations: Not on file    Relationship status: Not on file  . Intimate partner violence:    Fear of current or ex partner: Not on file    Emotionally abused: Not on file    Physically abused: Not on file    Forced sexual activity: Not on file  Other Topics Concern  . Not on file  Social History Narrative  .  Not on file   No family history on file.  OBJECTIVE:  Vitals:   10/25/17 1624  BP: 102/66  Pulse: (!) 121  Resp: 20  Temp: 100 F (37.8 C)  TempSrc: Oral  SpO2: 100%     General appearance: AOx3; appears fatigued HEENT: PERRL.  EOM grossly intact.  Sinuses nontender; mild clear rhinorrhea; tonsils nonerythematous, uvula midline Neck: supple without LAD Lungs: clear to auscultation bilaterally without adventitious breath sounds Heart: regular rate and rhythm.  Radial pulses 2+ symmetrical bilaterally Skin: warm and moist Psychological: alert and cooperative; normal mood and affect  DIAGNOSTIC STUDIES:  Dg Chest 2 View  Result Date: 10/25/2017 CLINICAL DATA:  Dry cough with fever EXAM: CHEST - 2 VIEW COMPARISON:  08/15/2017  FINDINGS: The heart size and mediastinal contours are within normal limits. Both lungs are clear. The visualized skeletal structures are unremarkable. IMPRESSION: No active cardiopulmonary disease. Electronically Signed   By: Donavan Foil M.D.   On: 10/25/2017 17:10   ASSESSMENT & PLAN:  1. Lower respiratory infection     Meds ordered this encounter  Medications  . azithromycin (ZITHROMAX) 250 MG tablet    Sig: Take 1 tablet (250 mg total) by mouth daily. Take first 2 tablets together, then 1 every day until finished.    Dispense:  6 tablet    Refill:  0    Order Specific Question:   Supervising Provider    Answer:   Wynona Luna [[id number]]    Get plenty of rest and push fluids Use OTC medication as needed for symptomatic relief Take antibiotic as directed and to completion Continue with inhaler as needed for shortness of breath or wheezing Follow up with PCP next week for reevaluation (handwritten on AVS) Return or go to ER if you have any new or worsening symptoms such as worsening cough, shortness of breath, chest pain, fever that does not moderate with treatment, worsening fatigue, night sweats, etc...  Given strict return and ER precautions due to recent HIV diagnosis with cough, elevated heart rate, and fever in office.  Patient aware and in agreement with this plan.    Reviewed expectations re: course of current medical issues. Questions answered. Outlined signs and symptoms indicating need for more acute intervention. Patient verbalized understanding. After Visit Summary given.          Lestine Box, PA-C 10/25/17 1743

## 2017-10-25 NOTE — ED Triage Notes (Signed)
C/o cough and generalized bodyaches onset Tues

## 2017-10-29 ENCOUNTER — Other Ambulatory Visit: Payer: Self-pay

## 2017-11-12 MED FILL — BIKTARVY 50-200-25 MG TABS: 50-200-25 | 30 days supply | Qty: 30 | Fill #1

## 2017-11-15 ENCOUNTER — Ambulatory Visit: Payer: No Typology Code available for payment source | Admitting: Family

## 2017-11-22 ENCOUNTER — Encounter: Payer: Self-pay | Admitting: General Practice

## 2017-12-13 MED FILL — BIKTARVY 50-200-25 MG TABS: 50-200-25 | 30 days supply | Qty: 30 | Fill #2

## 2017-12-13 MED FILL — SULFAMETHOXAZOLE-TMP DS TAB: 800-160 | 30 days supply | Qty: 30 | Fill #1

## 2017-12-17 ENCOUNTER — Other Ambulatory Visit: Payer: No Typology Code available for payment source

## 2017-12-17 DIAGNOSIS — B20 Human immunodeficiency virus [HIV] disease: Secondary | ICD-10-CM

## 2017-12-19 LAB — T-HELPER CELL (CD4) - (RCID CLINIC ONLY)
CD4 % Helper T Cell: 4 % — ABNORMAL LOW (ref 33–55)
CD4 T Cell Abs: 50 /uL — ABNORMAL LOW (ref 400–2700)

## 2017-12-19 LAB — COMPREHENSIVE METABOLIC PANEL
AG Ratio: 0.7 (calc) — ABNORMAL LOW (ref 1.0–2.5)
ALT: 5 U/L — AB (ref 6–29)
AST: 20 U/L (ref 10–30)
Albumin: 3.7 g/dL (ref 3.6–5.1)
Alkaline phosphatase (APISO): 75 U/L (ref 33–115)
BILIRUBIN TOTAL: 0.6 mg/dL (ref 0.2–1.2)
BUN: 11 mg/dL (ref 7–25)
CALCIUM: 8.9 mg/dL (ref 8.6–10.2)
CO2: 24 mmol/L (ref 20–32)
Chloride: 100 mmol/L (ref 98–110)
Creat: 0.86 mg/dL (ref 0.50–1.10)
Globulin: 5 g/dL (calc) — ABNORMAL HIGH (ref 1.9–3.7)
Glucose, Bld: 90 mg/dL (ref 65–99)
Potassium: 4.4 mmol/L (ref 3.5–5.3)
SODIUM: 132 mmol/L — AB (ref 135–146)
TOTAL PROTEIN: 8.7 g/dL — AB (ref 6.1–8.1)

## 2017-12-19 LAB — CBC
HCT: 29.6 % — ABNORMAL LOW (ref 35.0–45.0)
HEMOGLOBIN: 9.9 g/dL — AB (ref 11.7–15.5)
MCH: 32.1 pg (ref 27.0–33.0)
MCHC: 33.4 g/dL (ref 32.0–36.0)
MCV: 96.1 fL (ref 80.0–100.0)
MPV: 10.8 fL (ref 7.5–12.5)
PLATELETS: 238 10*3/uL (ref 140–400)
RBC: 3.08 10*6/uL — ABNORMAL LOW (ref 3.80–5.10)
RDW: 13.8 % (ref 11.0–15.0)
WBC: 6.3 10*3/uL (ref 3.8–10.8)

## 2017-12-19 LAB — HIV-1 RNA QUANT-NO REFLEX-BLD
HIV 1 RNA Quant: 26 copies/mL — ABNORMAL HIGH
HIV-1 RNA Quant, Log: 1.41 Log copies/mL — ABNORMAL HIGH

## 2017-12-31 ENCOUNTER — Ambulatory Visit (INDEPENDENT_AMBULATORY_CARE_PROVIDER_SITE_OTHER): Payer: No Typology Code available for payment source | Admitting: Family

## 2017-12-31 ENCOUNTER — Encounter: Payer: Self-pay | Admitting: Family

## 2017-12-31 VITALS — BP 109/72 | HR 84 | Temp 97.6°F | Wt 127.0 lb

## 2017-12-31 DIAGNOSIS — B20 Human immunodeficiency virus [HIV] disease: Secondary | ICD-10-CM

## 2017-12-31 DIAGNOSIS — Z5181 Encounter for therapeutic drug level monitoring: Secondary | ICD-10-CM | POA: Insufficient documentation

## 2017-12-31 DIAGNOSIS — Z23 Encounter for immunization: Secondary | ICD-10-CM

## 2017-12-31 DIAGNOSIS — Z0000 Encounter for general adult medical examination without abnormal findings: Secondary | ICD-10-CM | POA: Diagnosis not present

## 2017-12-31 MED ORDER — SULFAMETHOXAZOLE-TRIMETHOPRIM 800-160 MG PO TABS: 1.0000 | ORAL_TABLET | Freq: Every day | ORAL | 3 refills | Status: DC

## 2017-12-31 MED ORDER — BICTEGRAVIR-EMTRICITAB-TENOFOV 50-200-25 MG PO TABS: 1.0000 | ORAL_TABLET | Freq: Every day | ORAL | 3 refills | Status: DC

## 2017-12-31 NOTE — Progress Notes (Signed)
HPI: Krystal Short is a 32 y.o. female who presents to the [place] [facility] clinic today to follow-up with Marya Amsler for her HIV infection.  Patient Active Problem List   Diagnosis Date Noted  . Cough 08/15/2017  . Molluscum contagiosum 06/27/2017  . AIDS ([place])   . 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 ([place])   . Thrush of mouth and esophagus ([place])   . Community acquired pneumonia 06/05/2017  . CAP (community acquired pneumonia) 06/03/2017  . Pleurisy 06/03/2017  . Tachycardia 06/03/2017  . Facial dermatitis 06/03/2017  . Tobacco abuse 06/03/2017  . Acute hyponatremia 06/03/2017    Patient's Medications  New Prescriptions   No medications on file  Previous Medications   No medications on file  Modified Medications   Modified Medication Previous Medication   BICTEGRAVIR-EMTRICITABINE-TENOFOVIR AF (BIKTARVY) 50-200-25 MG TABS TABLET bictegravir-emtricitabine-tenofovir AF (BIKTARVY) 50-200-25 MG TABS tablet      Take 1 tablet by mouth daily.    Take 1 tablet by mouth daily.   SULFAMETHOXAZOLE-TRIMETHOPRIM (BACTRIM DS,SEPTRA DS) 800-160 MG TABLET sulfamethoxazole-trimethoprim (BACTRIM DS,SEPTRA DS) 800-160 MG tablet      Take 1 tablet by mouth daily. Start at completion of other prescription.    Take 1 tablet by mouth daily. Start at completion of other prescription.  Discontinued Medications   ALBUTEROL (PROVENTIL HFA;VENTOLIN HFA) 108 (90 BASE) MCG/ACT INHALER    Inhale 1-2 puffs into the lungs every 6 (six) hours as needed for wheezing or shortness of breath.   AZITHROMYCIN (ZITHROMAX) 250 MG TABLET    Take 1 tablet (250 mg total) by mouth daily. Take first 2 tablets together, then 1 every day until finished.   GUAIFENESIN (MUCINEX PO)    Take 1 tablet by mouth 2 (two) times daily as needed (cough/congestion).   IBUPROFEN (ADVIL,MOTRIN) 200 MG TABLET    Take 400 mg by mouth every 6 (six) hours as needed for fever or headache (pain).    Allergies: No Known Allergies  Past  Medical History: Past Medical History:  Diagnosis Date  . HIV (human immunodeficiency virus infection) ([place])   . Pneumonia 06/06/2017  . UTI (urinary tract infection)     Social History: Social History   Socioeconomic History  . Marital status: Married    Spouse name: Not on file  . Number of children: Not on file  . Years of education: Not on file  . Highest education level: Not on file  Occupational History  . Not on file  Social Needs  . Financial resource strain: Not on file  . Food insecurity:    Worry: Not on file    Inability: Not on file  . Transportation needs:    Medical: Not on file    Non-medical: Not on file  Tobacco Use  . Smoking status: Former Smoker    Types: Cigarettes    Last attempt to quit: 12/25/2016    Years since quitting: 1.0  . Smokeless tobacco: Never Used  Substance and Sexual Activity  . Alcohol use: Yes  . Drug use: No  . Sexual activity: Yes    Birth control/protection: Condom  Lifestyle  . Physical activity:    Days per week: Not on file    Minutes per session: Not on file  . Stress: Not on file  Relationships  . Social connections:    Talks on phone: Not on file    Gets together: Not on file    Attends religious service: Not on file    Active  member of club or organization: Not on file    Attends meetings of clubs or organizations: Not on file    Relationship status: Not on file  Other Topics Concern  . Not on file  Social History Narrative  . Not on file    Labs: Lab Results  Component Value Date   HIV1RNAQUANT 26 (H) 12/17/2017   HIV1RNAQUANT 40 (H) 09/13/2017   HIV1RNAQUANT 60 (H) 08/15/2017   CD4TABS 50 (L) 12/17/2017   CD4TABS 70 (L) 09/13/2017   CD4TABS 40 (L) 08/15/2017    RPR and STI Lab Results  Component Value Date   LABRPR Non Reactive 06/05/2017    STI Results GC CT  06/05/2017 Negative Negative    Hepatitis B Lab Results  Component Value Date   HEPBSAG Negative 06/05/2017   Hepatitis C No  results found for: HEPCAB, HCVRNAPCRQN Hepatitis A No results found for: HAV Lipids: No results found for: CHOL, TRIG, HDL, CHOLHDL, VLDL, LDLCALC  Current HIV Regimen: Biktarvy  Assessment: Krystal Short is here today to see Marya Amsler to follow up for her HIV infection. I originally saw her husband for PrEP but he wanted to defer Truvada due to the kidney and bone issues associated with it.  Descovy is now approved for PrEP so I was able to meet with her and discuss the difference in Descovy and Truvada so she can share that information with her husband.  I also told her to have him call me and I can discuss with him as well and get him started.   Plan: - Information about Descovy for PrEP  Cassie L. Kuppelweiser, PharmD, [place], [place] 12/31/2017, 4:39 PM

## 2017-12-31 NOTE — Progress Notes (Signed)
Subjective:    Patient ID: Krystal Short, female    DOB: August 18, 1985, 32 y.o.   MRN: [id number]  Chief Complaint  Patient presents with  . HIV Positive/AIDS     HPI:  Krystal Short is a 32 y.o. female who presents today for routine follow up of HIV/AIDS.   Krystal Short was last seen in the office of 09/27/17 for routine follow up and had been adherent to her antiretroviral regimen of Biktarvy supplemented by Bactrim for OI prophylaxis. CD4 count was 70 and viral load was 40. She was continued on Biktarvy and Bactrim. Most recent blood work completed shows a viral load that is undetectable and CD4 count of 50. Kidney function, liver function and electrolytes were normal. Health maintaince due includes Prevnar, influenza vaccination, and cervical cancer screening.   Krystal Short continues to take her Biktarvy as prescribed supplemented by Bactrim for OI prophylaxis.  She does have nausea when taking her medications about 3-4 times per week with a severity that is manageable.  She has not missed any doses of medication and has no problems obtaining her medication from [facility] outpatient pharmacy.  Able to see his a dermatologist with removal of musculum contagiosum spots and  will continue to follow-up.  She has gained 20 pounds since diagnosis and is feeling well.  Her last dental visit was approximately 2 months ago and generally has appointments every 6 months for checkups.  Denies fevers, chills, night sweats, headaches, changes in vision, neck pain/stiffness, nausea, diarrhea, vomiting, lesions or rashes.    No Known Allergies    Outpatient Medications Prior to Visit  Medication Sig Dispense Refill  . albuterol (PROVENTIL HFA;VENTOLIN HFA) 108 (90 Base) MCG/ACT inhaler Inhale 1-2 puffs into the lungs every 6 (six) hours as needed for wheezing or shortness of breath. 1 Inhaler 0  . azithromycin (ZITHROMAX) 250 MG tablet Take 1 tablet (250 mg total) by mouth daily. Take first 2  tablets together, then 1 every day until finished. 6 tablet 0  . bictegravir-emtricitabine-tenofovir AF (BIKTARVY) 50-200-25 MG TABS tablet Take 1 tablet by mouth daily. 30 tablet 3  . guaiFENesin (MUCINEX PO) Take 1 tablet by mouth 2 (two) times daily as needed (cough/congestion).    Marland Kitchen ibuprofen (ADVIL,MOTRIN) 200 MG tablet Take 400 mg by mouth every 6 (six) hours as needed for fever or headache (pain).    Marland Kitchen sulfamethoxazole-trimethoprim (BACTRIM DS,SEPTRA DS) 800-160 MG tablet Take 1 tablet by mouth daily. Start at completion of other prescription. 30 tablet 3   No facility-administered medications prior to visit.      Past Medical History:  Diagnosis Date  . HIV (human immunodeficiency virus infection) ([place])   . Pneumonia 06/06/2017  . UTI (urinary tract infection)      Past Surgical History:  Procedure Laterality Date  . NO PAST SURGERIES      Review of Systems  Constitutional: Negative for appetite change, chills, diaphoresis, fatigue, fever and unexpected weight change.  Eyes:       Negative for acute change in vision  Respiratory: Negative for chest tightness, shortness of breath and wheezing.   Cardiovascular: Negative for chest pain.  Gastrointestinal: Negative for diarrhea, nausea and vomiting.  Genitourinary: Negative for dysuria, pelvic pain and vaginal discharge.  Musculoskeletal: Negative for neck pain and neck stiffness.  Skin: Negative for rash.  Neurological: Negative for seizures, syncope, weakness and headaches.  Hematological: Negative for adenopathy. Does not bruise/bleed easily.  Psychiatric/Behavioral: Negative for hallucinations.  Objective:    BP 109/72   Pulse 84   Temp 97.6 F (36.4 C) (Oral)   Wt 127 lb (57.6 kg)   LMP 11/20/2017   BMI 24.00 kg/m  Nursing note and vital signs reviewed.  Physical Exam  Constitutional: She is oriented to person, place, and time. She appears well-developed and well-nourished. No distress.  Seated in the  chair; pleasant.  HENT:  Mouth/Throat: Oropharynx is clear and moist.  Musculum contagiosum noted on face.   Eyes: Conjunctivae are normal.  Neck: Neck supple.  Cardiovascular: Normal rate, regular rhythm, normal heart sounds and intact distal pulses. Exam reveals no gallop and no friction rub.  No murmur heard. Pulmonary/Chest: Effort normal and breath sounds normal. No respiratory distress. She has no wheezes. She has no rales. She exhibits no tenderness.  Abdominal: Soft. Bowel sounds are normal. There is no tenderness.  Lymphadenopathy:    She has no cervical adenopathy.  Neurological: She is alert and oriented to person, place, and time.  Skin: Skin is warm and dry. No rash noted.  Psychiatric: She has a normal mood and affect.       Assessment & Plan:   Problem List Items Addressed This Visit      Other   AIDS ([place]) - Primary    Krystal Short continues to have well-controlled HIV/AIDS with her current antiretroviral regimen of Biktarvy supplemented with Bactrim.  She remains undetectable with a CD4 count of 50.  She did describe feeling sick when doing her blood work which has since resolved.  She has no signs/symptoms of opportunistic infection or progressive HIV disease.  Continue current dose of Biktarvy supplemented with Bactrim for OI prophylaxis.  Declines condoms.  Has been not currently on PrEP but may consider.  Follow-up office visit in 3 months with blood work 1 to 2 weeks prior to appointment.      Relevant Medications   sulfamethoxazole-trimethoprim (BACTRIM DS,SEPTRA DS) 800-160 MG tablet   bictegravir-emtricitabine-tenofovir AF (BIKTARVY) 50-200-25 MG TABS tablet   Other Relevant Orders   T-helper cell (CD4)- ([facility] clinic only)   HIV-1 RNA quant-no reflex-bld   CBC   Lipid panel   Comprehensive metabolic panel   RPR   Healthcare maintenance    HPV series initiated and Menveo updated today.  She has cervical cancer screening scheduled for 01/10/2018 with  gynecology.  Dental screenings are up-to-date per recommendations and attends every 6 months for cleanings.  She received her flu shot from work.       Other Visit Diagnoses    Need for meningococcal vaccination       Relevant Orders   MENINGOCOCCAL MCV4O (Completed)   Need for HPV vaccination       Relevant Orders   HPV 9-valent vaccine,Recombinat (Completed)       I have discontinued [place]'s albuterol, ibuprofen, guaiFENesin (MUCINEX PO), and azithromycin. I am also having her maintain her sulfamethoxazole-trimethoprim and bictegravir-emtricitabine-tenofovir AF.   Meds ordered this encounter  Medications  . sulfamethoxazole-trimethoprim (BACTRIM DS,SEPTRA DS) 800-160 MG tablet    Sig: Take 1 tablet by mouth daily. Start at completion of other prescription.    Dispense:  30 tablet    Refill:  3    Order Specific Question:   Supervising Provider    Answer:   Carlyle Basques [[id number]]  . bictegravir-emtricitabine-tenofovir AF (BIKTARVY) 50-200-25 MG TABS tablet    Sig: Take 1 tablet by mouth daily.    Dispense:  30 tablet    Refill:  3    Order Specific Question:   Supervising Provider    Answer:   Carlyle Basques [[id number]]     Follow-up: Return in about 3 months (around 04/02/2018), or if symptoms worsen or fail to improve.   Terri Piedra, MSN, FNP-C Nurse Practitioner [facility] [place] [facility] Office phone: [phone] Pager: [place] number: [phone]

## 2017-12-31 NOTE — Assessment & Plan Note (Signed)
HPV series initiated and Menveo updated today.  She has cervical cancer screening scheduled for 01/10/2018 with gynecology.  Dental screenings are up-to-date per recommendations and attends every 6 months for cleanings.  She received her flu shot from work.

## 2017-12-31 NOTE — Assessment & Plan Note (Signed)
[name] continues to have well-controlled HIV/AIDS with her current antiretroviral regimen of Biktarvy supplemented with Bactrim.  She remains undetectable with a CD4 count of 50.  She did describe feeling sick when doing her blood work which has since resolved.  She has no signs/symptoms of opportunistic infection or progressive HIV disease.  Continue current dose of Biktarvy supplemented with Bactrim for OI prophylaxis.  Declines condoms.  Has been not currently on PrEP but may consider.  Follow-up office visit in 3 months with blood work 1 to 2 weeks prior to appointment.

## 2017-12-31 NOTE — Patient Instructions (Signed)
Nice to see you.  You are looking great!  Please continue to take your Biktarvy and Bactrim.  Plan for follow up in 3 months or sooner if needed with lab work 1-2 weeks prior to your appointment.   Please schedule an appointment for a nurse visit for the second HPV vaccination.

## 2018-01-09 ENCOUNTER — Ambulatory Visit (INDEPENDENT_AMBULATORY_CARE_PROVIDER_SITE_OTHER): Payer: No Typology Code available for payment source | Admitting: Obstetrics and Gynecology

## 2018-01-09 ENCOUNTER — Encounter: Payer: Self-pay | Admitting: Obstetrics and Gynecology

## 2018-01-09 ENCOUNTER — Other Ambulatory Visit: Payer: Self-pay

## 2018-01-09 VITALS — BP 112/75 | HR 88 | Temp 97.8°F | Ht 61.5 in | Wt 122.4 lb

## 2018-01-09 DIAGNOSIS — Z1151 Encounter for screening for human papillomavirus (HPV): Secondary | ICD-10-CM | POA: Diagnosis not present

## 2018-01-09 DIAGNOSIS — B373 Candidiasis of vulva and vagina: Secondary | ICD-10-CM

## 2018-01-09 DIAGNOSIS — N760 Acute vaginitis: Secondary | ICD-10-CM | POA: Diagnosis not present

## 2018-01-09 DIAGNOSIS — Z124 Encounter for screening for malignant neoplasm of cervix: Secondary | ICD-10-CM | POA: Diagnosis not present

## 2018-01-09 DIAGNOSIS — Z0000 Encounter for general adult medical examination without abnormal findings: Secondary | ICD-10-CM | POA: Diagnosis not present

## 2018-01-09 DIAGNOSIS — N898 Other specified noninflammatory disorders of vagina: Secondary | ICD-10-CM | POA: Diagnosis not present

## 2018-01-09 DIAGNOSIS — B9689 Other specified bacterial agents as the cause of diseases classified elsewhere: Secondary | ICD-10-CM

## 2018-01-09 DIAGNOSIS — Z30013 Encounter for initial prescription of injectable contraceptive: Secondary | ICD-10-CM

## 2018-01-09 DIAGNOSIS — Z3202 Encounter for pregnancy test, result negative: Secondary | ICD-10-CM | POA: Diagnosis not present

## 2018-01-09 DIAGNOSIS — Z3042 Encounter for surveillance of injectable contraceptive: Secondary | ICD-10-CM | POA: Diagnosis not present

## 2018-01-09 DIAGNOSIS — Z113 Encounter for screening for infections with a predominantly sexual mode of transmission: Secondary | ICD-10-CM | POA: Diagnosis not present

## 2018-01-09 LAB — POCT URINE PREGNANCY: PREG TEST UR: NEGATIVE

## 2018-01-09 MED ORDER — MEDROXYPROGESTERONE ACETATE 150 MG/ML IM SUSP
150.0000 mg | Freq: Once | INTRAMUSCULAR | Status: AC
  Administered 2018-01-09: 150 mg via INTRAMUSCULAR

## 2018-01-09 NOTE — Progress Notes (Addendum)
Subjective:    Krystal Short is a 32 y.o. female who presents for an annual exam. The patient has no complaints today. The patient is sexually active. GYN screening history: 2016, normal. The patient wears seatbelts: yes. The patient participates in regular exercise: no. Has the patient ever been transfused or tattooed?: no. The patient reports that there is not domestic violence in her life.   Menstrual History: OB History    Gravida  0   Para  0   Term  0   Preterm  0   AB  0   Living  0     SAB  0   TAB  0   Ectopic  0   Multiple  0   Live Births  0           Patient's last menstrual period was 12/29/2017 (approximate).   Review of Systems A comprehensive review of systems was negative except for: Respiratory: positive for cough    Objective:    BP 112/75 (BP Location: Left Arm, Patient Position: Sitting, Cuff Size: Normal)   Pulse 88   Temp 97.8 F (36.6 C) (Oral)   Ht 5' 1.5" (1.562 m)   Wt 55.5 kg   LMP 12/29/2017 (Approximate)   SpO2 100%   BMI 22.75 kg/m   General Appearance:    Alert, cooperative, no distress, appears stated age  Head:    Normocephalic, without obvious abnormality, atraumatic  Eyes:    PERRL, conjunctiva/corneas clear, EOM's intact, fundi    benign, both eyes  Ears:    Normal TM's and external ear canals, both ears  Nose:   Nares normal, septum midline, mucosa normal, no drainage    or sinus tenderness  Throat:   Lips, mucosa, and tongue normal; teeth and gums normal  Neck:   Supple, symmetrical, trachea midline, no adenopathy;    thyroid:  no enlargement/tenderness/nodules; no carotid   bruit or JVD  Back:     Symmetric, no curvature, ROM normal, no CVA tenderness  Lungs:     Clear to auscultation bilaterally, respirations unlabored  Chest Wall:    No tenderness or deformity   Heart:    Regular rate and rhythm, S1 and S2 normal, no murmur, rub   or gallop  Breast Exam:    No tenderness, masses, or nipple abnormality   Abdomen:     Soft, non-tender, bowel sounds active all four quadrants,    no masses, no organomegaly  Genitalia:    Normal female without lesion, discharge or tenderness  Rectal:    Normal tone, normal prostate, no masses or tenderness;   guaiac negative stool  Extremities:   Extremities normal, atraumatic, no cyanosis or edema  Pulses:   2+ and symmetric all extremities  Skin:   Skin color, texture, turgor normal, no rashes or lesions  Lymph nodes:   Cervical, supraclavicular, and axillary nodes normal  Neurologic:   CNII-XII intact, normal strength, sensation and reflexes    throughout  .    Assessment:  Pap smear for cervical cancer screening - Plan: Cytology - PAP( [place])  Screen for sexually transmitted diseases  Encounter for Depo-Provera contraception - Plan: POCT urine pregnancy, medroxyPROGESTERone (DEPO-PROVERA) injection 150 mg  Healthcare maintenance  Encounter for initial prescription of injectable contraceptive HIV(+) status  Healthy female exam.    Plan:     All questions answered. Await pap smear results. Chlamydia specimen. Contraception: condoms and Depo-Provera injections. Discussed healthy lifestyle modifications. Neurosurgeon distributed. GC  specimen. HSV specimen. Pap smear. Pregnancy test, result: negative.

## 2018-01-11 LAB — CYTOLOGY - PAP
BACTERIAL VAGINITIS: POSITIVE — AB
Candida vaginitis: POSITIVE — AB
Chlamydia: NEGATIVE
DIAGNOSIS: HIGH — AB
HPV: DETECTED — AB
Neisseria Gonorrhea: NEGATIVE
Trichomonas: NEGATIVE

## 2018-01-14 ENCOUNTER — Telehealth: Payer: Self-pay | Admitting: *Deleted

## 2018-01-14 DIAGNOSIS — B379 Candidiasis, unspecified: Secondary | ICD-10-CM

## 2018-01-14 DIAGNOSIS — N760 Acute vaginitis: Principal | ICD-10-CM

## 2018-01-14 DIAGNOSIS — B9689 Other specified bacterial agents as the cause of diseases classified elsewhere: Secondary | ICD-10-CM

## 2018-01-14 MED ORDER — METRONIDAZOLE 500 MG PO TABS: 500.0000 mg | ORAL_TABLET | Freq: Two times a day (BID) | ORAL | 0 refills | Status: DC

## 2018-01-14 MED ORDER — TERCONAZOLE 0.4 % VA CREA: 1.0000 | TOPICAL_CREAM | Freq: Every day | VAGINAL | 0 refills | Status: DC

## 2018-01-14 MED FILL — BIKTARVY 50-200-25 MG TABS: 50-200-25 | 30 days supply | Qty: 30 | Fill #3

## 2018-01-14 MED FILL — SULFAMETHOXAZOLE-TMP DS TAB: 800-160 | 30 days supply | Qty: 30 | Fill #2

## 2018-01-14 NOTE — Telephone Encounter (Signed)
-----   Message from Laury Deep, [place] sent at 01/13/2018 12:16 PM EST ----- Treat for BV and yeast

## 2018-01-31 ENCOUNTER — Ambulatory Visit (INDEPENDENT_AMBULATORY_CARE_PROVIDER_SITE_OTHER): Payer: No Typology Code available for payment source | Admitting: *Deleted

## 2018-01-31 DIAGNOSIS — B20 Human immunodeficiency virus [HIV] disease: Secondary | ICD-10-CM | POA: Diagnosis not present

## 2018-01-31 DIAGNOSIS — Z23 Encounter for immunization: Secondary | ICD-10-CM

## 2018-02-11 ENCOUNTER — Other Ambulatory Visit: Payer: Self-pay | Admitting: Nurse Practitioner

## 2018-02-11 ENCOUNTER — Ambulatory Visit
Admission: RE | Admit: 2018-02-11 | Discharge: 2018-02-11 | Disposition: A | Discharge status: Home / Self Care | Payer: No Typology Code available for payment source | Source: Ambulatory Visit | Attending: Nurse Practitioner | Admitting: Nurse Practitioner

## 2018-02-11 DIAGNOSIS — R52 Pain, unspecified: Secondary | ICD-10-CM | POA: Diagnosis not present

## 2018-02-11 DIAGNOSIS — R059 Cough, unspecified: Secondary | ICD-10-CM

## 2018-02-11 DIAGNOSIS — R05 Cough: Secondary | ICD-10-CM

## 2018-02-11 DIAGNOSIS — R062 Wheezing: Secondary | ICD-10-CM | POA: Diagnosis not present

## 2018-02-12 MED FILL — BIKTARVY 50-200-25 MG TABS: 50-200-25 | 30 days supply | Qty: 30 | Fill #0

## 2018-02-12 MED FILL — SULFAMETHOXAZOLE-TMP DS TAB: 800-160 | 30 days supply | Qty: 30 | Fill #3

## 2018-02-14 ENCOUNTER — Telehealth: Payer: Self-pay | Admitting: Behavioral Health

## 2018-02-14 NOTE — Telephone Encounter (Signed)
Does she have any other symptoms beside congestion? Any fevers? This could be a viral infection that is going to take some time to improve despite over the counter medications. If she is not feeling better she can make an acute appointment.

## 2018-02-14 NOTE — Telephone Encounter (Signed)
Patient called stating she has been sick since Sunday 02/10/2018.  She states she went to see her primary care provider and they tested her for the flu, did a chest x-ray and she states everything was negative.  She states her primary care providers office told her to call her infectious disease doctor if she was not feeling better.  Patient states she continues to have nasal congestion.  She states she has increased her fluid intake, she is taking sudafed and has tried Mucinex daily.  She wanted Terri Piedra to be aware and see if he had any additional suggestions. Pricilla Riffle RN

## 2018-02-18 NOTE — Telephone Encounter (Signed)
Called patient to see how she was doing.  Patient states her symptoms have finally subsided.  Patient states she does not need f/u at this time.  Also gave her information about [facility]  An evisits through My chart in the future. Pricilla Riffle RN

## 2018-02-25 ENCOUNTER — Ambulatory Visit (INDEPENDENT_AMBULATORY_CARE_PROVIDER_SITE_OTHER): Payer: No Typology Code available for payment source | Admitting: Family Medicine

## 2018-02-25 DIAGNOSIS — R112 Nausea with vomiting, unspecified: Secondary | ICD-10-CM

## 2018-02-25 DIAGNOSIS — R87613 High grade squamous intraepithelial lesion on cytologic smear of cervix (HGSIL): Secondary | ICD-10-CM

## 2018-02-25 MED ORDER — ONDANSETRON 4 MG PO TBDP
4.0000 mg | ORAL_TABLET | Freq: Once | ORAL | Status: AC
  Administered 2018-02-25: 4 mg via ORAL

## 2018-02-25 NOTE — Progress Notes (Signed)
Patient scheduled for colposcopy due to HSIL PAP. Patient having fevers, nausea, vomiting, diarrhea. Will reschedule.

## 2018-02-25 NOTE — Addendum Note (Signed)
Addended by: Bethanne Ginger on: 02/25/2018 03:38 PM   Modules accepted: Orders

## 2018-02-25 NOTE — Progress Notes (Addendum)
Per provider gave pt Ondansetron orally.Zofran for nausea & vomiting.

## 2018-02-27 DIAGNOSIS — B081 Molluscum contagiosum: Secondary | ICD-10-CM | POA: Diagnosis not present

## 2018-02-27 DIAGNOSIS — B078 Other viral warts: Secondary | ICD-10-CM | POA: Diagnosis not present

## 2018-03-04 ENCOUNTER — Encounter: Payer: Self-pay | Admitting: Family

## 2018-03-04 ENCOUNTER — Ambulatory Visit: Payer: 59 | Admitting: Family

## 2018-03-04 VITALS — BP 95/68 | HR 132 | Temp 97.6°F | Wt 120.0 lb

## 2018-03-04 DIAGNOSIS — R059 Cough, unspecified: Secondary | ICD-10-CM

## 2018-03-04 DIAGNOSIS — R05 Cough: Secondary | ICD-10-CM

## 2018-03-04 MED ORDER — AZITHROMYCIN 250 MG PO TABS: 500.0000 mg | ORAL_TABLET | Freq: Every day | ORAL | 0 refills | Status: AC

## 2018-03-04 MED ORDER — AMOXICILLIN-POT CLAVULANATE 875-125 MG PO TABS: 1.0000 | ORAL_TABLET | Freq: Two times a day (BID) | ORAL | 0 refills | Status: DC

## 2018-03-04 NOTE — Assessment & Plan Note (Signed)
Krystal Short appears to have signs/symptoms consistent with a bacterial sinus infection with concern for pneumonia given the shortness of breath and tachycardia. This may be related to dehydration as well. Unlikely opportunistic infection as she remains on Bactrim and Biktarvy. Will treat for CAP with Augmentin and Azithromycin. Continue OTC medications as needed for symptoms relief and supportive care. Follow up if symptoms worsen or do not improve.

## 2018-03-04 NOTE — Patient Instructions (Signed)
Nice to see you.  Please continue to take your Biktarvy.  Start the antibiotics and take until completed.  General Recommendations:    Please drink plenty of fluids.  Get plenty of rest   Sleep in humidified air  Use saline nasal sprays  Netti pot   OTC Medications:  Decongestants - helps relieve congestion   Flonase (generic fluticasone) or Nasacort (generic triamcinolone) - please make sure to use the "cross-over" technique at a 45 degree angle towards the opposite eye as opposed to straight up the nasal passageway.   Sudafed (generic pseudoephedrine - Note this is the one that is available behind the pharmacy counter); Products with phenylephrine (-PE) may also be used but is often not as effective as pseudoephedrine.   If you have HIGH BLOOD PRESSURE - Coricidin HBP; AVOID any product that is -D as this contains pseudoephedrine which may increase your blood pressure.  Afrin (oxymetazoline) every 6-8 hours for up to 3 days.   Allergies - helps relieve runny nose, itchy eyes and sneezing   Claritin (generic loratidine), Allegra (fexofenidine), or Zyrtec (generic cyrterizine) for runny nose. These medications should not cause drowsiness.  Note - Benadryl (generic diphenhydramine) may be used however may cause drowsiness  Cough -   Delsym or Robitussin (generic dextromethorphan)  Expectorants - helps loosen mucus to ease removal   Mucinex (generic guaifenesin) as directed on the package.  Headaches / General Aches   Tylenol (generic acetaminophen) - DO NOT EXCEED 3 grams (3,000 mg) in a 24 hour time period  Advil/Motrin (generic ibuprofen)   Sore Throat -   Salt water gargle   Chloraseptic (generic benzocaine) spray or lozenges / Sucrets (generic dyclonine)

## 2018-03-04 NOTE — Progress Notes (Signed)
Subjective:    Patient ID: Krystal Short, female    DOB: [birth year], 33 y.o.   MRN: [id number]  Chief Complaint  Patient presents with  . URI      HPI:  Krystal Short is a 33 y.o. female who presents today for an acute office visit.   This is a new problem. Mr. Ghazarian has been experiencing cough, congestion, sinus pain/pressure with occasional lightheadedness and shortness of breath that has been going on for about 3 weeks and been refractory to OTC Alkaseltzer plus. Last fevers were about 1 week ago. Course of the symptoms appears to be improving very slowly. Previously seen in primary care about 2 weeks ago with recommended symptomatic treatment.    No Known Allergies    Outpatient Medications Prior to Visit  Medication Sig Dispense Refill  . bictegravir-emtricitabine-tenofovir AF (BIKTARVY) 50-200-25 MG TABS tablet Take 1 tablet by mouth daily. 30 tablet 3  . metroNIDAZOLE (FLAGYL) 500 MG tablet Take 1 tablet (500 mg total) by mouth 2 (two) times daily. 14 tablet 0  . sulfamethoxazole-trimethoprim (BACTRIM DS,SEPTRA DS) 800-160 MG tablet Take 1 tablet by mouth daily. Start at completion of other prescription. 30 tablet 3  . terconazole (TERAZOL 7) 0.4 % vaginal cream Place 1 applicator vaginally at bedtime. 45 g 0   No facility-administered medications prior to visit.      Past Medical History:  Diagnosis Date  . Acute hyponatremia 06/03/2017  . CAP (community acquired pneumonia) 06/03/2017  . Community acquired pneumonia 06/05/2017  . Facial dermatitis 06/03/2017  . HIV (human immunodeficiency virus infection) ([place])   . Pleurisy 06/03/2017  . Pneumonia 06/06/2017  . 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 ([place])   . Thrush of mouth and esophagus ([place])   . UTI (urinary tract infection)      Past Surgical History:  Procedure Laterality Date  . NO PAST SURGERIES      Review of Systems  Constitutional: Negative for chills and fever.  HENT: Positive  for congestion, sinus pressure, sinus pain and sore throat.   Respiratory: Positive for cough and shortness of breath. Negative for chest tightness.   Cardiovascular: Negative for chest pain.  Gastrointestinal: Negative for constipation, diarrhea, nausea and vomiting.  Neurological: Positive for light-headedness. Negative for weakness.      Objective:    BP 95/68   Pulse (!) 132   Temp 97.6 F (36.4 C) (Oral)   Wt 120 lb (54.4 kg)   BMI 22.31 kg/m  Nursing note and vital signs reviewed.  Physical Exam Constitutional:      General: She is not in acute distress.    Appearance: She is well-developed. She is ill-appearing.  HENT:     Right Ear: Hearing, tympanic membrane, ear canal and external ear normal.     Left Ear: Hearing, tympanic membrane, ear canal and external ear normal.     Nose:     Right Sinus: Maxillary sinus tenderness present. No frontal sinus tenderness.     Left Sinus: Maxillary sinus tenderness present. No frontal sinus tenderness.     Mouth/Throat:     Mouth: Mucous membranes are moist.     Pharynx: Oropharynx is clear. Uvula midline. No pharyngeal swelling or oropharyngeal exudate.  Cardiovascular:     Rate and Rhythm: Regular rhythm. Tachycardia present.     Heart sounds: Normal heart sounds.  Pulmonary:     Effort: Pulmonary effort is normal.     Breath sounds: Normal breath sounds.  Skin:  General: Skin is warm and dry.  Neurological:     Mental Status: She is alert and oriented to person, place, and time.  Psychiatric:        Behavior: Behavior normal.        Thought Content: Thought content normal.        Judgment: Judgment normal.        Assessment & Plan:   Problem List Items Addressed This Visit      Other   Cough - Primary    Krystal Short appears to have signs/symptoms consistent with a bacterial sinus infection with concern for pneumonia given the shortness of breath and tachycardia. This may be related to dehydration as well.  Unlikely opportunistic infection as she remains on Bactrim and Biktarvy. Will treat for CAP with Augmentin and Azithromycin. Continue OTC medications as needed for symptoms relief and supportive care. Follow up if symptoms worsen or do not improve.       Relevant Medications   amoxicillin-clavulanate (AUGMENTIN) 875-125 MG tablet   azithromycin (ZITHROMAX Z-PAK) 250 MG tablet       I am having Krystal Short start on amoxicillin-clavulanate and azithromycin. I am also having her maintain her sulfamethoxazole-trimethoprim, bictegravir-emtricitabine-tenofovir AF, metroNIDAZOLE, and terconazole.   Meds ordered this encounter  Medications  . amoxicillin-clavulanate (AUGMENTIN) 875-125 MG tablet    Sig: Take 1 tablet by mouth 2 (two) times daily.    Dispense:  14 tablet    Refill:  0    Order Specific Question:   Supervising Provider    Answer:   Carlyle Basques [[id number]]  . azithromycin (ZITHROMAX Z-PAK) 250 MG tablet    Sig: Take 2 tablets (500 mg total) by mouth daily for 3 days.    Dispense:  6 each    Refill:  0    Order Specific Question:   Supervising Provider    Answer:   Carlyle Basques [[id number]]     Follow-up: Return if symptoms worsen or fail to improve.   Terri Piedra, MSN, FNP-C Nurse Practitioner [facility] [place] [facility] Office phone: [phone] Pager: [place] number: [phone]

## 2018-03-08 ENCOUNTER — Inpatient Hospital Stay (HOSPITAL_COMMUNITY)
Admission: EM | Admit: 2018-03-08 | Discharge: 2018-03-10 | DRG: 974 | Disposition: A | Discharge status: Home / Self Care | Payer: 59 | Source: Ambulatory Visit | Attending: Internal Medicine | Admitting: Internal Medicine

## 2018-03-08 ENCOUNTER — Encounter (HOSPITAL_COMMUNITY): Payer: Self-pay

## 2018-03-08 ENCOUNTER — Other Ambulatory Visit: Payer: Self-pay

## 2018-03-08 ENCOUNTER — Emergency Department (HOSPITAL_COMMUNITY): Payer: 59

## 2018-03-08 DIAGNOSIS — R21 Rash and other nonspecific skin eruption: Secondary | ICD-10-CM | POA: Diagnosis present

## 2018-03-08 DIAGNOSIS — R0602 Shortness of breath: Secondary | ICD-10-CM | POA: Diagnosis not present

## 2018-03-08 DIAGNOSIS — Z87891 Personal history of nicotine dependence: Secondary | ICD-10-CM

## 2018-03-08 DIAGNOSIS — R0902 Hypoxemia: Secondary | ICD-10-CM | POA: Diagnosis not present

## 2018-03-08 DIAGNOSIS — B20 Human immunodeficiency virus [HIV] disease: Principal | ICD-10-CM | POA: Diagnosis present

## 2018-03-08 DIAGNOSIS — J189 Pneumonia, unspecified organism: Secondary | ICD-10-CM | POA: Diagnosis present

## 2018-03-08 DIAGNOSIS — Z792 Long term (current) use of antibiotics: Secondary | ICD-10-CM

## 2018-03-08 DIAGNOSIS — R0789 Other chest pain: Secondary | ICD-10-CM | POA: Diagnosis not present

## 2018-03-08 DIAGNOSIS — Z79899 Other long term (current) drug therapy: Secondary | ICD-10-CM | POA: Diagnosis not present

## 2018-03-08 DIAGNOSIS — I950 Idiopathic hypotension: Secondary | ICD-10-CM | POA: Diagnosis not present

## 2018-03-08 DIAGNOSIS — B348 Other viral infections of unspecified site: Secondary | ICD-10-CM | POA: Diagnosis present

## 2018-03-08 DIAGNOSIS — J9601 Acute respiratory failure with hypoxia: Secondary | ICD-10-CM | POA: Diagnosis present

## 2018-03-08 DIAGNOSIS — D539 Nutritional anemia, unspecified: Secondary | ICD-10-CM | POA: Diagnosis present

## 2018-03-08 DIAGNOSIS — R002 Palpitations: Secondary | ICD-10-CM | POA: Diagnosis not present

## 2018-03-08 DIAGNOSIS — R42 Dizziness and giddiness: Secondary | ICD-10-CM | POA: Diagnosis not present

## 2018-03-08 DIAGNOSIS — B59 Pneumocystosis: Secondary | ICD-10-CM | POA: Diagnosis present

## 2018-03-08 DIAGNOSIS — R000 Tachycardia, unspecified: Secondary | ICD-10-CM | POA: Diagnosis not present

## 2018-03-08 DIAGNOSIS — R0603 Acute respiratory distress: Secondary | ICD-10-CM

## 2018-03-08 DIAGNOSIS — R079 Chest pain, unspecified: Secondary | ICD-10-CM | POA: Diagnosis not present

## 2018-03-08 DIAGNOSIS — R05 Cough: Secondary | ICD-10-CM | POA: Diagnosis not present

## 2018-03-08 LAB — CREATININE, SERUM
Creatinine, Ser: 1.03 mg/dL — ABNORMAL HIGH (ref 0.44–1.00)
GFR calc Af Amer: >60 mL/min (ref >60)
GFR calc non Af Amer: >60 mL/min (ref >60)

## 2018-03-08 LAB — CBC
HCT: 21.1 % — ABNORMAL LOW (ref 36.0–46.0)
HEMATOCRIT: 19.1 % — AB (ref 36.0–46.0)
Hemoglobin: 5.9 g/dL — CL (ref 12.0–15.0)
Hemoglobin: 6.5 g/dL — CL (ref 12.0–15.0)
MCH: 31.4 pg (ref 26.0–34.0)
MCH: 31.1 pg (ref 26.0–34.0)
MCHC: 30.9 g/dL (ref 30.0–36.0)
MCHC: 30.8 g/dL (ref 30.0–36.0)
MCV: 101.6 fL — ABNORMAL HIGH (ref 80.0–100.0)
MCV: 101 fL — ABNORMAL HIGH (ref 80.0–100.0)
PLATELETS: 130 10*3/uL — AB (ref 150–400)
Platelets: 149 10*3/uL — ABNORMAL LOW (ref 150–400)
RBC: 1.88 MIL/uL — ABNORMAL LOW (ref 3.87–5.11)
RBC: 2.09 MIL/uL — ABNORMAL LOW (ref 3.87–5.11)
RDW: 17.2 % — ABNORMAL HIGH (ref 11.5–15.5)
RDW: 17.1 % — ABNORMAL HIGH (ref 11.5–15.5)
WBC: 8.6 10*3/uL (ref 4.0–10.5)
WBC: 8.2 10*3/uL (ref 4.0–10.5)
nRBC: 0.3 % — ABNORMAL HIGH (ref 0.0–0.2)
nRBC: 0.4 % — ABNORMAL HIGH (ref 0.0–0.2)

## 2018-03-08 LAB — RETICULOCYTES
Immature Retic Fract: 25 % — ABNORMAL HIGH (ref 2.3–15.9)
RBC.: 1.88 MIL/uL — ABNORMAL LOW (ref 3.87–5.11)
RETIC CT PCT: 6.2 % — AB (ref 0.4–3.1)
Retic Count, Absolute: 116.6 10*3/uL (ref 19.0–186.0)

## 2018-03-08 LAB — URINALYSIS, ROUTINE W REFLEX MICROSCOPIC
Bilirubin Urine: NEGATIVE
Glucose, UA: NEGATIVE mg/dL
Hgb urine dipstick: NEGATIVE
Ketones, ur: NEGATIVE mg/dL
Leukocytes, UA: NEGATIVE
Nitrite: NEGATIVE
Protein, ur: NEGATIVE mg/dL
Specific Gravity, Urine: 1.015 (ref 1.005–1.030)
pH: 6 (ref 5.0–8.0)

## 2018-03-08 LAB — CBC WITH DIFFERENTIAL/PLATELET
Abs Immature Granulocytes: 0.55 K/uL — ABNORMAL HIGH (ref 0.00–0.07)
Basophils Absolute: 0 K/uL (ref 0.0–0.1)
Basophils Relative: 0 %
Eosinophils Absolute: 0 K/uL (ref 0.0–0.5)
Eosinophils Relative: 0 %
HCT: 29.9 % — ABNORMAL LOW (ref 36.0–46.0)
Hemoglobin: 8.9 g/dL — ABNORMAL LOW (ref 12.0–15.0)
Immature Granulocytes: 6 %
Lymphocytes Relative: 17 %
Lymphs Abs: 1.7 K/uL (ref 0.7–4.0)
MCH: 30.3 pg (ref 26.0–34.0)
MCHC: 29.8 g/dL — ABNORMAL LOW (ref 30.0–36.0)
MCV: 101.7 fL — ABNORMAL HIGH (ref 80.0–100.0)
Monocytes Absolute: 1.9 K/uL — ABNORMAL HIGH (ref 0.1–1.0)
Monocytes Relative: 20 %
Neutro Abs: 5.3 K/uL (ref 1.7–7.7)
Neutrophils Relative %: 57 %
Platelets: 183 K/uL (ref 150–400)
RBC: 2.94 MIL/uL — ABNORMAL LOW (ref 3.87–5.11)
RDW: 17.2 % — ABNORMAL HIGH (ref 11.5–15.5)
WBC: 9.5 K/uL (ref 4.0–10.5)
nRBC: 0.2 % (ref 0.0–0.2)

## 2018-03-08 LAB — COMPREHENSIVE METABOLIC PANEL WITH GFR
ALT: 20 U/L (ref 0–44)
AST: 37 U/L (ref 15–41)
Albumin: 3.5 g/dL (ref 3.5–5.0)
Alkaline Phosphatase: 95 U/L (ref 38–126)
Anion gap: 12 (ref 5–15)
BUN: 16 mg/dL (ref 6–20)
CO2: 19 mmol/L — ABNORMAL LOW (ref 22–32)
Calcium: 9.3 mg/dL (ref 8.9–10.3)
Chloride: 99 mmol/L (ref 98–111)
Creatinine, Ser: 1.22 mg/dL — ABNORMAL HIGH (ref 0.44–1.00)
GFR calc Af Amer: >60 mL/min
GFR calc non Af Amer: 59 mL/min — ABNORMAL LOW
Glucose, Bld: 84 mg/dL (ref 70–99)
Potassium: 5.2 mmol/L — ABNORMAL HIGH (ref 3.5–5.1)
Sodium: 130 mmol/L — ABNORMAL LOW (ref 135–145)
Total Bilirubin: 0.6 mg/dL (ref 0.3–1.2)
Total Protein: 9.8 g/dL — ABNORMAL HIGH (ref 6.5–8.1)

## 2018-03-08 LAB — LACTIC ACID, PLASMA: Lactic Acid, Venous: 1.4 mmol/L (ref 0.5–1.9)

## 2018-03-08 LAB — LACTATE DEHYDROGENASE: LDH: 341 U/L — ABNORMAL HIGH (ref 98–192)

## 2018-03-08 LAB — IRON AND TIBC
Iron: 82 ug/dL (ref 28–170)
Saturation Ratios: 26 % (ref 10.4–31.8)
TIBC: 315 ug/dL (ref 250–450)
UIBC: 233 ug/dL

## 2018-03-08 LAB — VITAMIN B12: VITAMIN B 12: 920 pg/mL — AB (ref 180–914)

## 2018-03-08 LAB — FOLATE: Folate: 8.1 ng/mL (ref >5.9)

## 2018-03-08 LAB — BRAIN NATRIURETIC PEPTIDE: B Natriuretic Peptide: 8.8 pg/mL (ref 0.0–100.0)

## 2018-03-08 LAB — FERRITIN: Ferritin: 578 ng/mL — ABNORMAL HIGH (ref 11–307)

## 2018-03-08 MED ORDER — ONDANSETRON HCL 4 MG PO TABS: 4.0000 mg | ORAL_TABLET | Freq: Four times a day (QID) | ORAL | Status: DC | PRN

## 2018-03-08 MED ORDER — ACETAMINOPHEN 325 MG PO TABS: 650.0000 mg | ORAL_TABLET | Freq: Four times a day (QID) | ORAL | Status: DC | PRN

## 2018-03-08 MED ORDER — SODIUM CHLORIDE 0.9 % IV SOLN
2.0000 g | INTRAVENOUS | Status: DC
  Administered 2018-03-08: 2 g via INTRAVENOUS
  Filled 2018-03-08 (×2): qty 20

## 2018-03-08 MED ORDER — LACTATED RINGERS IV BOLUS (SEPSIS)
1000.0000 mL | Freq: Once | INTRAVENOUS | Status: AC
  Administered 2018-03-08: 1000 mL via INTRAVENOUS

## 2018-03-08 MED ORDER — SULFAMETHOXAZOLE-TRIMETHOPRIM 400-80 MG/5ML IV SOLN
320.0000 mg | Freq: Three times a day (TID) | INTRAVENOUS | Status: DC
  Administered 2018-03-08 – 2018-03-10 (×5): 320 mg via INTRAVENOUS
  Filled 2018-03-08 (×7): qty 20

## 2018-03-08 MED ORDER — ONDANSETRON HCL 4 MG/2ML IJ SOLN
4.0000 mg | Freq: Four times a day (QID) | INTRAMUSCULAR | Status: DC | PRN
  Administered 2018-03-10: 4 mg via INTRAVENOUS
  Filled 2018-03-08: qty 2

## 2018-03-08 MED ORDER — LACTATED RINGERS IV BOLUS (SEPSIS)
500.0000 mL | Freq: Once | INTRAVENOUS | Status: AC
  Administered 2018-03-08: 500 mL via INTRAVENOUS

## 2018-03-08 MED ORDER — SULFAMETHOXAZOLE-TRIMETHOPRIM 800-160 MG PO TABS
1.0000 | ORAL_TABLET | Freq: Once | ORAL | Status: AC
  Administered 2018-03-08: 1 via ORAL
  Filled 2018-03-08: qty 1

## 2018-03-08 MED ORDER — BICTEGRAVIR-EMTRICITAB-TENOFOV 50-200-25 MG PO TABS
1.0000 | ORAL_TABLET | Freq: Every day | ORAL | Status: DC
  Administered 2018-03-09: 1 via ORAL
  Filled 2018-03-08 (×2): qty 1

## 2018-03-08 MED ORDER — ACETAMINOPHEN 650 MG RE SUPP: 650.0000 mg | Freq: Four times a day (QID) | RECTAL | Status: DC | PRN

## 2018-03-08 MED ORDER — LACTATED RINGERS IV BOLUS (SEPSIS)
250.0000 mL | Freq: Once | INTRAVENOUS | Status: AC
  Administered 2018-03-08: 250 mL via INTRAVENOUS

## 2018-03-08 MED ORDER — VANCOMYCIN HCL IN DEXTROSE 1-5 GM/200ML-% IV SOLN
1000.0000 mg | Freq: Once | INTRAVENOUS | Status: AC
  Administered 2018-03-08: 1000 mg via INTRAVENOUS
  Filled 2018-03-08: qty 200

## 2018-03-08 MED ORDER — ENOXAPARIN SODIUM 40 MG/0.4ML ~~LOC~~ SOLN
40.0000 mg | SUBCUTANEOUS | Status: DC
  Administered 2018-03-09: 40 mg via SUBCUTANEOUS
  Filled 2018-03-08 (×2): qty 0.4

## 2018-03-08 MED ORDER — SODIUM CHLORIDE 0.9% IV SOLUTION
Freq: Once | INTRAVENOUS | Status: AC
  Administered 2018-03-09: 01:00:00 via INTRAVENOUS

## 2018-03-08 MED ORDER — SODIUM CHLORIDE 0.9 % IV SOLN
500.0000 mg | INTRAVENOUS | Status: DC
  Administered 2018-03-08 – 2018-03-09 (×2): 500 mg via INTRAVENOUS
  Filled 2018-03-08 (×3): qty 500

## 2018-03-08 NOTE — ED Notes (Signed)
Pt ambulatory to the bathroom w/o assistance and with steady gait On returning to rm pt c/o SOB RN Dewayne Hatch notified

## 2018-03-08 NOTE — ED Triage Notes (Signed)
Coming from PCP-states she has had a cough for about a month-has been worked up for PNA-CXR's have been negative-states SOB upon exertion-on birth control injection since December-states HR 125, BP 118/63-history of HIV

## 2018-03-08 NOTE — H&P (Signed)
History and Physical    Krystal Short NKN:[id number] DOB: [birth year] DOA: 03/08/2018  PCP: Dianna Rossetti, NP  Patient coming from: Home.  Chief Complaint: Shortness of breath.  HPI: Krystal Short is a 33 y.o. female with history of AIDS last CD4 count was 27 December 17, 2017 presents to the ER with ongoing shortness of breath and weakness.  Patient states her symptoms started a month ago and had been to her PCP on February 10, 2018 and had checked for flu which was negative.  Patient had persistent symptoms shortness of breath on exertion had fever chills sweats and productive cough.  Had gone to her infectious disease clinic on March 04, 2018 and was prescribed Zithromax and Augmentin for sinusitis and possible pneumonia.  At the time patient also was noticed to be tachycardic.  Since patient symptoms persisted patient came to the ER.  ED Course: In the ER patient was tachycardic with heart rate around 120s hemoglobin was around 8.9 dropped from 9.9 and November and patient is observed to have progressive decrease in hemoglobin.  Macrocytic picture.  Patient states he does not have menstrual cycle since she is getting Depo shot and EKG shows sinus tachycardia.  Chest x-ray shows bilateral haziness concerning for atypical pneumonia.  Patient started on empiric antibiotics for community-acquired pneumonia and also Bactrim added for possible pneumocystis.  Patient is not hypoxic at this time.  Patient admitted for pneumonia and progressive anemia.  Review of Systems: As per HPI, rest all negative.   Past Medical History:  Diagnosis Date  . Acute hyponatremia 06/03/2017  . CAP (community acquired pneumonia) 06/03/2017  . Community acquired pneumonia 06/05/2017  . Facial dermatitis 06/03/2017  . HIV (human immunodeficiency virus infection) ([place])   . Pleurisy 06/03/2017  . Pneumonia 06/06/2017  . 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 ([place])   . Thrush of mouth and esophagus ([place])    . UTI (urinary tract infection)     Past Surgical History:  Procedure Laterality Date  . NO PAST SURGERIES       reports that she quit smoking about 14 months ago. Her smoking use included cigarettes. She has never used smokeless tobacco. She reports current alcohol use. She reports that she does not use drugs.  No Known Allergies  History reviewed. No pertinent family history.  Prior to Admission medications   Medication Sig Start Date End Date Taking? Authorizing Provider  amoxicillin-clavulanate (AUGMENTIN) 875-125 MG tablet Take 1 tablet by mouth 2 (two) times daily. 03/04/18  Yes Golden Circle, FNP  bictegravir-emtricitabine-tenofovir AF (BIKTARVY) 50-200-25 MG TABS tablet Take 1 tablet by mouth daily. 12/31/17  Yes Golden Circle, FNP  imiquimod [place]) 5 % cream Apply 1 application topically See admin instructions. Every 3 days 03/05/18  Yes [provider]  sulfamethoxazole-trimethoprim (BACTRIM DS,SEPTRA DS) 800-160 MG tablet Take 1 tablet by mouth daily. Start at completion of other prescription. 12/31/17  Yes Golden Circle, FNP  metroNIDAZOLE (FLAGYL) 500 MG tablet Take 1 tablet (500 mg total) by mouth 2 (two) times daily. Patient not taking: Reported on 03/08/2018 01/14/18   Laury Deep, CNM  terconazole (TERAZOL 7) 0.4 % vaginal cream Place 1 applicator vaginally at bedtime. Patient not taking: Reported on 03/08/2018 01/14/18   Laury Deep, CNM    Physical Exam: Vitals:   03/08/18 1730 03/08/18 1754 03/08/18 1800 03/08/18 1900  BP: 94/75 94/75 100/75 109/80  Pulse:  (!) 111 99 (!) 101  Resp: 19 19 19  (!)  21  Temp:      TempSrc:      SpO2:  100% 100% 100%      Constitutional: Moderately built and nourished. Vitals:   03/08/18 1730 03/08/18 1754 03/08/18 1800 03/08/18 1900  BP: 94/75 94/75 100/75 109/80  Pulse:  (!) 111 99 (!) 101  Resp: 19 19 19  (!) 21  Temp:      TempSrc:      SpO2:  100% 100% 100%   Eyes: Anicteric no  pallor. ENMT: No discharge from the ears eyes nose and mouth. Neck: No mass felt.  No neck rigidity.  No JVD appreciated. Respiratory: No rhonchi or crepitations. Cardiovascular: S1-S2 heard. Abdomen: Soft nontender bowel sounds present. Musculoskeletal: No edema.  No joint effusion. Skin: No rash. Neurologic: Alert awake oriented to time place and person.  Moves all extremities. Psychiatric: Appears normal.  Normal affect.   Labs on Admission: I have personally reviewed following labs and imaging studies  CBC: Recent Labs  Lab 03/08/18 1722  WBC 9.5  NEUTROABS 5.3  HGB 8.9*  HCT 29.9*  MCV 101.7*  PLT 093   Basic Metabolic Panel: Recent Labs  Lab 03/08/18 1722  NA 130*  K 5.2*  CL 99  CO2 19*  GLUCOSE 84  BUN 16  CREATININE 1.22*  CALCIUM 9.3   GFR: Estimated Creatinine Clearance: 52.2 mL/min (A) (by C-G formula based on SCr of 1.22 mg/dL (H)). Liver Function Tests: Recent Labs  Lab 03/08/18 1722  AST 37  ALT 20  ALKPHOS 95  BILITOT 0.6  PROT 9.8*  ALBUMIN 3.5   No results for input(s): LIPASE, AMYLASE in the last 168 hours. No results for input(s): AMMONIA in the last 168 hours. Coagulation Profile: No results for input(s): INR, PROTIME in the last 168 hours. Cardiac Enzymes: No results for input(s): CKTOTAL, CKMB, CKMBINDEX, TROPONINI in the last 168 hours. BNP (last 3 results) No results for input(s): PROBNP in the last 8760 hours. HbA1C: No results for input(s): HGBA1C in the last 72 hours. CBG: No results for input(s): GLUCAP in the last 168 hours. Lipid Profile: No results for input(s): CHOL, HDL, LDLCALC, TRIG, CHOLHDL, LDLDIRECT in the last 72 hours. Thyroid Function Tests: No results for input(s): TSH, T4TOTAL, FREET4, T3FREE, THYROIDAB in the last 72 hours. Anemia Panel: No results for input(s): VITAMINB12, FOLATE, FERRITIN, TIBC, IRON, RETICCTPCT in the last 72 hours. Urine analysis:    Component Value Date/Time   COLORURINE YELLOW  03/08/2018 1722   APPEARANCEUR CLEAR 03/08/2018 1722   LABSPEC 1.015 03/08/2018 1722   PHURINE 6.0 03/08/2018 1722   GLUCOSEU NEGATIVE 03/08/2018 1722   HGBUR NEGATIVE 03/08/2018 1722   BILIRUBINUR NEGATIVE 03/08/2018 1722   KETONESUR NEGATIVE 03/08/2018 1722   PROTEINUR NEGATIVE 03/08/2018 1722   UROBILINOGEN 1.0 12/03/2011 0550   NITRITE NEGATIVE 03/08/2018 1722   LEUKOCYTESUR NEGATIVE 03/08/2018 1722   Sepsis Labs: @LABRCNTIP (procalcitonin:4,lacticidven:4) )No results found for this or any previous visit (from the past 240 hour(s)).   Radiological Exams on Admission: Dg Chest 2 View  Result Date: 03/08/2018 CLINICAL DATA:  Cough for approximately 1 month. EXAM: CHEST - 2 VIEW COMPARISON:  PA and lateral chest 02/11/2018, 06/03/2017 and 10/25/2017. FINDINGS: Hazy bilateral airspace disease is present bilaterally and new since the most recent exam. Heart size is normal. No pneumothorax or pleural effusion. No acute or focal bony abnormality. IMPRESSION: Diffuse hazy bilateral airspace disease is new since the most recent examination and worrisome for pneumonia, possibly atypical. Electronically Signed  By: Inge Rise M.D.   On: 03/08/2018 15:54    EKG: Independently reviewed.  Sinus tachycardia.  Assessment/Plan Principal Problem:   Acute respiratory failure with hypoxia (HCC) Active Problems:   AIDS ([place])   CAP (community acquired pneumonia)    1. Acute respiratory failure with hypoxia secondary to pneumonia -patient on ceftriaxone and Zithromax.  I have added Bactrim per pharmacy for now for covering pneumocystis.  Patient is non-hypoxic so did not had prednisone for now.  Will check LDH level.  Check sputum cultures blood cultures urine for Legionella strep antigen.  Respiratory viral panel is pending. 2. Macrocytic anemia progressively worsening -patient denies any obvious GI bleed symptoms or any menorrhagia.  We will check anemia panel repeat CBC and if there is any  further drop or hemoglobin less than 7 will transfuse.  There is no obvious cause for blood loss and may have to check for any bone marrow suppression from patient medications.  May need hematology consult at that time. 3. AIDS on antiretroviral and prophylactic medications.   DVT prophylaxis: SCDs. Code Status: Full code. Family Communication: Discussed with patient. Disposition Plan: Home. Consults called: None. Admission status: Inpatient.   [name] MD [facility] Pager [phone].  If 7PM-7AM, please contact night-coverage [email or web address] Password [facility]  03/08/2018, 8:47 PM

## 2018-03-08 NOTE — Progress Notes (Signed)
A consult was received from an ED physician for vanc per pharmacy dosing.  The patient's profile has been reviewed for ht/wt/allergies/indication/available labs.   A one time order has been placed for vanc 1g.  Further antibiotics/pharmacy consults should be ordered by admitting physician if indicated.                       Thank you, Kara Mead 03/08/2018  4:57 PM

## 2018-03-08 NOTE — ED Notes (Signed)
ED TO INPATIENT HANDOFF REPORT  Name/Age/Gender Krystal Short 33 y.o. female  Code Status    Code Status Orders  (From admission, onward)         Start     Ordered   03/08/18 2044  Full code  Continuous     03/08/18 2046        Code Status History    Date Active Date Inactive Code Status Order ID Comments User Context   06/03/2017 1741 06/11/2017 2216 Full Code [id number]  [name], NP ED      Home/SNF/Other Home  Chief Complaint shortness of breath, dizziness  Level of Care/Admitting Diagnosis ED Disposition    ED Disposition Condition [facility]: [facility] [[id number]]  Level of Care: Telemetry [5]  Admit to tele based on following criteria: Monitor for Ischemic changes  Diagnosis: Acute respiratory failure with hypoxia [facility]) [[id number]]  Admitting Physician: [name] [phone]  Attending Physician: [name] [phone]  Estimated length of stay: past midnight tomorrow  Certification:: I certify this patient will need inpatient services for at least 2 midnights  PT Class (Do Not Modify): Inpatient [101]  PT Acc Code (Do Not Modify): Private [1]       Medical History Past Medical History:  Diagnosis Date  . Acute hyponatremia 06/03/2017  . CAP (community acquired pneumonia) 06/03/2017  . Community acquired pneumonia 06/05/2017  . Facial dermatitis 06/03/2017  . HIV (human immunodeficiency virus infection) ([place])   . Pleurisy 06/03/2017  . Pneumonia 06/06/2017  . 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 ([place])   . Thrush of mouth and esophagus ([place])   . UTI (urinary tract infection)     Allergies No Known Allergies  IV Location/Drains/Wounds Patient Lines/Drains/Airways Status   Active Line/Drains/Airways    Name:   Placement date:   Placement time:   Site:   Days:   Peripheral IV 03/08/18 Left Antecubital   03/08/18    1720    Antecubital   less than 1   Peripheral IV 03/08/18 Left  Arm   03/08/18    1745    Arm   less than 1          Labs/Imaging Results for orders placed or performed during the hospital encounter of 03/08/18 (from the past 48 hour(s))  Lactic acid, plasma     Status: None   Collection Time: 03/08/18  5:22 PM  Result Value Ref Range   Lactic Acid, Venous 1.4 0.5 - 1.9 mmol/L    Comment: Performed at [facility], [place] [address] [place], [place] [address]  Comprehensive metabolic panel     Status: Abnormal   Collection Time: 03/08/18  5:22 PM  Result Value Ref Range   Sodium 130 (L) 135 - 145 mmol/L   Potassium 5.2 (H) 3.5 - 5.1 mmol/L   Chloride 99 98 - 111 mmol/L   CO2 19 (L) 22 - 32 mmol/L   Glucose, Bld 84 70 - 99 mg/dL   BUN 16 6 - 20 mg/dL   Creatinine, Ser 1.22 (H) 0.44 - 1.00 mg/dL   Calcium 9.3 8.9 - 10.3 mg/dL   Total Protein 9.8 (H) 6.5 - 8.1 g/dL   Albumin 3.5 3.5 - 5.0 g/dL   AST 37 15 - 41 U/L   ALT 20 0 - 44 U/L   Alkaline Phosphatase 95 38 - 126 U/L   Total Bilirubin 0.6 0.3 - 1.2 mg/dL  GFR calc non Af Amer 59 (L) >60 mL/min   GFR calc Af Amer >60 >60 mL/min   Anion gap 12 5 - 15    Comment: Performed at [facility], [place] [address], [place], [place] [address]  CBC WITH DIFFERENTIAL     Status: Abnormal   Collection Time: 03/08/18  5:22 PM  Result Value Ref Range   WBC 9.5 4.0 - 10.5 K/uL   RBC 2.94 (L) 3.87 - 5.11 MIL/uL   Hemoglobin 8.9 (L) 12.0 - 15.0 g/dL   HCT 29.9 (L) 36.0 - 46.0 %   MCV 101.7 (H) 80.0 - 100.0 fL   MCH 30.3 26.0 - 34.0 pg   MCHC 29.8 (L) 30.0 - 36.0 g/dL   RDW 17.2 (H) 11.5 - 15.5 %   Platelets 183 150 - 400 K/uL   nRBC 0.2 0.0 - 0.2 %   Neutrophils Relative % 57 %   Neutro Abs 5.3 1.7 - 7.7 K/uL   Lymphocytes Relative 17 %   Lymphs Abs 1.7 0.7 - 4.0 K/uL   Monocytes Relative 20 %   Monocytes Absolute 1.9 (H) 0.1 - 1.0 K/uL   Eosinophils Relative 0 %   Eosinophils Absolute 0.0 0.0 - 0.5 K/uL   Basophils Relative 0 %   Basophils Absolute 0.0 0.0  - 0.1 K/uL   WBC Morphology MILD LEFT SHIFT (1-5% METAS, OCC MYELO, OCC BANDS)    Immature Granulocytes 6 %   Abs Immature Granulocytes 0.55 (H) 0.00 - 0.07 K/uL    Comment: Performed at [facility], [place] [address] [place], [place] [address]  Urinalysis, Routine w reflex microscopic     Status: None   Collection Time: 03/08/18  5:22 PM  Result Value Ref Range   Color, Urine YELLOW YELLOW   APPearance CLEAR CLEAR   Specific Gravity, Urine 1.015 1.005 - 1.030   pH 6.0 5.0 - 8.0   Glucose, UA NEGATIVE NEGATIVE mg/dL   Hgb urine dipstick NEGATIVE NEGATIVE   Bilirubin Urine NEGATIVE NEGATIVE   Ketones, ur NEGATIVE NEGATIVE mg/dL   Protein, ur NEGATIVE NEGATIVE mg/dL   Nitrite NEGATIVE NEGATIVE   Leukocytes, UA NEGATIVE NEGATIVE    Comment: Performed at [place] [address] [place], [place] [address]  CBC     Status: Abnormal   Collection Time: 03/08/18  9:29 PM  Result Value Ref Range   WBC 8.6 4.0 - 10.5 K/uL   RBC 1.88 (L) 3.87 - 5.11 MIL/uL   Hemoglobin 5.9 (LL) 12.0 - 15.0 g/dL    Comment: REPEATED TO VERIFY DELTA CHECK NOTED THIS CRITICAL RESULT HAS VERIFIED AND BEEN CALLED TO J TALKINGTON RN BY [facility] ON 01 31 2020 AT 2204, AND HAS BEEN READ BACK. CRITICAL RESULT VERIFIED    HCT 19.1 (L) 36.0 - 46.0 %   MCV 101.6 (H) 80.0 - 100.0 fL   MCH 31.4 26.0 - 34.0 pg   MCHC 30.9 30.0 - 36.0 g/dL   RDW 17.2 (H) 11.5 - 15.5 %   Platelets 130 (L) 150 - 400 K/uL   nRBC 0.3 (H) 0.0 - 0.2 %    Comment: Performed at [facility], [place] [address] [place], [place] [address]  Creatinine, serum     Status: Abnormal   Collection Time: 03/08/18  9:29 PM  Result Value Ref Range   Creatinine, Ser 1.03 (H) 0.44 - 1.00 mg/dL   GFR calc non Af Amer >60 >60 mL/min  GFR calc Af Amer >60 >60 mL/min    Comment: Performed at [facility], [place] [address] [place], [place] [address]  Lactate dehydrogenase      Status: Abnormal   Collection Time: 03/08/18  9:29 PM  Result Value Ref Range   LDH 341 (H) 98 - 192 U/L    Comment: Performed at [facility], [place] [address] [place], [place] [address]  Vitamin B12     Status: Abnormal   Collection Time: 03/08/18  9:29 PM  Result Value Ref Range   Vitamin B-12 920 (H) 180 - 914 pg/mL    Comment: (NOTE) This assay is not validated for testing neonatal or myeloproliferative syndrome specimens for Vitamin B12 levels. Performed at [facility], [place] [address] [place], [place] [address]   Folate     Status: None   Collection Time: 03/08/18  9:29 PM  Result Value Ref Range   Folate 8.1 >5.9 ng/mL    Comment: Performed at [facility], [place] [address] [place], [place] [address]  Iron and TIBC     Status: None   Collection Time: 03/08/18  9:29 PM  Result Value Ref Range   Iron 82 28 - 170 ug/dL   TIBC 315 250 - 450 ug/dL   Saturation Ratios 26 10.4 - 31.8 %   UIBC 233 ug/dL    Comment: Performed at [facility], [place] [address], [place], [place] [address]  Ferritin     Status: Abnormal   Collection Time: 03/08/18  9:29 PM  Result Value Ref Range   Ferritin 578 (H) 11 - 307 ng/mL    Comment: Performed at [facility], [place] [address], [place], [place] [address]  Reticulocytes     Status: Abnormal   Collection Time: 03/08/18  9:29 PM  Result Value Ref Range   Retic Ct Pct 6.2 (H) 0.4 - 3.1 %   RBC. 1.88 (L) 3.87 - 5.11 MIL/uL   Retic Count, Absolute 116.6 19.0 - 186.0 K/uL   Immature Retic Fract 25.0 (H) 2.3 - 15.9 %    Comment: Performed at [facility], [place] [address], [place], [place] [address]  Type and screen [place]     Status: None (Preliminary result)   Collection Time: 03/08/18  9:29 PM  Result Value Ref Range   ABO/RH(D) PENDING    Antibody Screen PENDING    Sample Expiration       03/11/2018 Performed at [facility], [place] [address] [place], [place] [address]   Brain natriuretic peptide     Status: None   Collection Time: 03/08/18  9:29 PM  Result Value Ref Range   B Natriuretic Peptide 8.8 0.0 - 100.0 pg/mL    Comment: Performed at [facility], [place] [address], [place],  [address]   Dg Chest 2 View  Result Date: 03/08/2018 CLINICAL DATA:  Cough for approximately 1 month. EXAM: CHEST - 2 VIEW COMPARISON:  PA and lateral chest 02/11/2018, 06/03/2017 and 10/25/2017. FINDINGS: Hazy bilateral airspace disease is present bilaterally and new since the most recent exam. Heart size is normal. No pneumothorax or pleural effusion. No acute or focal bony abnormality. IMPRESSION: Diffuse hazy bilateral airspace disease is new since the most recent examination and worrisome for pneumonia, possibly atypical. Electronically Signed   By: Inge Rise M.D.   On: 03/08/2018 15:54   EKG Interpretation  Date/Time:  Friday March 08 2018 15:11:10 EST Ventricular Rate:  125 PR Interval:    QRS Duration: 80 QT Interval:  302 QTC Calculation: 436 R Axis:   67 Text Interpretation:  Sinus tachycardia No acute changes No significant change since last tracing Confirmed by Varney Biles [phone]) on 03/08/2018 5:25:38 PM   Pending Labs Unresulted Labs (From admission, onward)    Start     Ordered   03/15/18 0500  Creatinine, serum  (enoxaparin (LOVENOX)    CrCl >/= 30 ml/min)  Weekly,   R    Comments:  while on enoxaparin therapy    03/08/18 2046   03/09/18 2458  Basic metabolic panel  Tomorrow morning,   R     03/08/18 2046   03/09/18 0500  CBC  Tomorrow morning,   R     03/08/18 2046   03/08/18 2210  CBC  ONCE - STAT,   STAT     03/08/18 2209   03/08/18 1926  Pneumocystis smear by DFA  Once,   R     03/08/18 1925   03/08/18 1727  Respiratory Panel by PCR  (Respiratory virus panel with precautions)  Once,   R     03/08/18 1726    03/08/18 1651  Blood Culture (routine x 2)  BLOOD CULTURE X 2,   STAT     03/08/18 1653   03/08/18 1651  Urine culture  ONCE - STAT,   STAT     03/08/18 1653          Vitals/Pain Today's Vitals   03/08/18 1900 03/08/18 2200 03/08/18 2230 03/08/18 2300  BP: 109/80 90/63 (!) 89/56 91/60  Pulse: (!) 101 (!) 101 (!) 101 (!) 101  Resp: (!) 21 19 (!) 25 15  Temp:      TempSrc:      SpO2: 100% 98% 100% 99%  PainSc:        Isolation Precautions Droplet precaution  Medications Medications  cefTRIAXone (ROCEPHIN) 2 g in sodium chloride 0.9 % 100 mL IVPB (0 g Intravenous Stopped 03/08/18 1848)  azithromycin (ZITHROMAX) 500 mg in sodium chloride 0.9 % 250 mL IVPB (0 mg Intravenous Stopped 03/08/18 1954)  bictegravir-emtricitabine-tenofovir AF (BIKTARVY) 50-200-25 MG per tablet 1 tablet (has no administration in time range)  acetaminophen (TYLENOL) tablet 650 mg (has no administration in time range)    Or  acetaminophen (TYLENOL) suppository 650 mg (has no administration in time range)  ondansetron (ZOFRAN) tablet 4 mg (has no administration in time range)    Or  ondansetron (ZOFRAN) injection 4 mg (has no administration in time range)  enoxaparin (LOVENOX) injection 40 mg (has no administration in time range)  sulfamethoxazole-trimethoprim (BACTRIM) 320 mg in dextrose 5 % 500 mL IVPB (320 mg Intravenous New Bag/Given 03/08/18 2226)  lactated ringers bolus 1,000 mL (0 mLs Intravenous Stopped 03/08/18 1848)    And  lactated ringers bolus 500 mL (0 mLs Intravenous Stopped 03/08/18 1922)    And  lactated ringers bolus 250 mL (0 mLs Intravenous Stopped 03/08/18 1923)  vancomycin (VANCOCIN) IVPB 1000 mg/200 mL premix (0 mg Intravenous Stopped 03/08/18 2034)  sulfamethoxazole-trimethoprim (BACTRIM DS,SEPTRA DS) 800-160 MG per tablet 1 tablet (1 tablet Oral Given 03/08/18 1935)    Mobility walks

## 2018-03-08 NOTE — Progress Notes (Signed)
Pharmacy Antibiotic Note  Krystal Short is a 33 y.o. female admitted on 03/08/2018 with suspected PCP.  Pharmacy has been consulted for Bactrim dosing.  Plan: Start Bactrim IV 15-20mg /kg/day divided q8     Temp (24hrs), Avg:97.9 F (36.6 C), Min:97.7 F (36.5 C), Max:98.1 F (36.7 C)  Recent Labs  Lab 03/08/18 1722  WBC 9.5  CREATININE 1.22*  LATICACIDVEN 1.4    Estimated Creatinine Clearance: 52.2 mL/min (A) (by C-G formula based on SCr of 1.22 mg/dL (H)).    No Known Allergies   Thank you for allowing pharmacy to be a part of this patient's care.  Kara Mead 03/08/2018 8:58 PM

## 2018-03-08 NOTE — ED Notes (Signed)
CRITICAL VALUE STICKER  CRITICAL VALUE: Hgb 5.9  RECEIVER (on-site recipient of call): Jake T RN  [place] NOTIFIED: 2214  MESSENGER (representative from lab): Harrell Lark  MD NOTIFIED: Hal Hope  TIME OF NOTIFICATION: 2220  RESPONSE: see orders

## 2018-03-08 NOTE — ED Notes (Signed)
EKG completed, cannot print off of computer. Will try again later

## 2018-03-08 NOTE — ED Provider Notes (Signed)
[facility] DEPT Provider Note   CSN: [id number] Arrival date & time: 03/08/18  1500     History   Chief Complaint Chief Complaint  Patient presents with  . Shortness of Breath    HPI Krystal Short is a 33 y.o. female.  HPI  33 year old female with history of HIV-AIDS comes in with chief complaint of shortness of breath. Patient states that she has been sick with URI-like symptoms for the last 1 month.  She was seen by her PCP and checked for influenza on January 5.  At that time her flu came back negative and she did not have any abnormal lung findings therefore she was closely monitored.  Over the past few days patient has had worsening of her shortness of breath and cough.  She is also having URI-like symptoms.  Her ID doctor started patient on Augmentin and azithromycin.  She states that despite taking the medications her symptoms have not improved significantly.  She saw her PCP today, and was advised to come to the ER.  Patient has history of PCP pneumonia and she takes Bactrim prophylactically.  Past Medical History:  Diagnosis Date  . Acute hyponatremia 06/03/2017  . CAP (community acquired pneumonia) 06/03/2017  . Community acquired pneumonia 06/05/2017  . Facial dermatitis 06/03/2017  . HIV (human immunodeficiency virus infection) ([place])   . Pleurisy 06/03/2017  . Pneumonia 06/06/2017  . 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)   . Thrush of mouth and esophagus ([place])   . UTI (urinary tract infection)     Patient Active Problem List   Diagnosis Date Noted  . Acute respiratory failure with hypoxia ([place]) 03/08/2018  . CAP (community acquired pneumonia) 03/08/2018  . Healthcare maintenance 12/31/2017  . Cough 08/15/2017  . Molluscum contagiosum 06/27/2017  . AIDS ([place])   . Tachycardia 06/03/2017  . Tobacco abuse 06/03/2017    Past Surgical History:  Procedure Laterality Date  . NO PAST SURGERIES       OB  History    Gravida  0   Para  0   Term  0   Preterm  0   AB  0   Living  0     SAB  0   TAB  0   Ectopic  0   Multiple  0   Live Births  0            Home Medications    Prior to Admission medications   Medication Sig Start Date End Date Taking? Authorizing Provider  amoxicillin-clavulanate (AUGMENTIN) 875-125 MG tablet Take 1 tablet by mouth 2 (two) times daily. 03/04/18  Yes Golden Circle, FNP  bictegravir-emtricitabine-tenofovir AF (BIKTARVY) 50-200-25 MG TABS tablet Take 1 tablet by mouth daily. 12/31/17  Yes Golden Circle, FNP  imiquimod [place]) 5 % cream Apply 1 application topically See admin instructions. Every 3 days 03/05/18  Yes [provider]  sulfamethoxazole-trimethoprim (BACTRIM DS,SEPTRA DS) 800-160 MG tablet Take 1 tablet by mouth daily. Start at completion of other prescription. 12/31/17  Yes Golden Circle, FNP  metroNIDAZOLE (FLAGYL) 500 MG tablet Take 1 tablet (500 mg total) by mouth 2 (two) times daily. Patient not taking: Reported on 03/08/2018 01/14/18   Laury Deep, CNM  terconazole (TERAZOL 7) 0.4 % vaginal cream Place 1 applicator vaginally at bedtime. Patient not taking: Reported on 03/08/2018 01/14/18   Laury Deep, CNM    Family History History reviewed. No pertinent family history.  Social History  Social History   Tobacco Use  . Smoking status: Former Smoker    Types: Cigarettes    Last attempt to quit: 12/25/2016    Years since quitting: 1.2  . Smokeless tobacco: Never Used  Substance Use Topics  . Alcohol use: Yes    Comment: weekend/socially  . Drug use: No     Allergies   Patient has no known allergies.   Review of Systems Review of Systems  Constitutional: Positive for activity change.  Respiratory: Positive for cough and shortness of breath.   Cardiovascular: Negative for chest pain.  Allergic/Immunologic: Positive for immunocompromised state.  All other systems reviewed and are  negative.    Physical Exam Updated Vital Signs BP 97/72 (BP Location: Right Arm)   Pulse 96   Temp 98 F (36.7 C) (Oral)   Resp 20   Ht 5\' 1"  (1.549 m)   Wt 53 kg   SpO2 95%   BMI 22.09 kg/m   Physical Exam Vitals signs and nursing note reviewed.  Constitutional:      Appearance: She is well-developed.  HENT:     Head: Normocephalic and atraumatic.  Eyes:     Pupils: Pupils are equal, round, and reactive to light.  Neck:     Musculoskeletal: Neck supple.  Cardiovascular:     Rate and Rhythm: Regular rhythm. Tachycardia present.     Heart sounds: Normal heart sounds. No murmur.  Pulmonary:     Effort: Pulmonary effort is normal. Tachypnea present. No accessory muscle usage.     Breath sounds: Examination of the right-lower field reveals rales. Examination of the left-lower field reveals rales. Rales present. No decreased breath sounds, wheezing or rhonchi.  Abdominal:     General: There is no distension.     Palpations: Abdomen is soft.  Skin:    General: Skin is warm and dry.     Findings: Rash present.  Neurological:     Mental Status: She is alert and oriented to person, place, and time.      ED Treatments / Results  Labs (all labs ordered are listed, but only abnormal results are displayed) Labs Reviewed  RESPIRATORY PANEL BY PCR - Abnormal; Notable for the following components:      Result Value   Rhinovirus / Enterovirus DETECTED (*)    All other components within normal limits  COMPREHENSIVE METABOLIC PANEL - Abnormal; Notable for the following components:   Sodium 130 (*)    Potassium 5.2 (*)    CO2 19 (*)    Creatinine, Ser 1.22 (*)    Total Protein 9.8 (*)    GFR calc non Af Amer 59 (*)    All other components within normal limits  CBC WITH DIFFERENTIAL/PLATELET - Abnormal; Notable for the following components:   RBC 2.94 (*)    Hemoglobin 8.9 (*)    HCT 29.9 (*)    MCV 101.7 (*)    MCHC 29.8 (*)    RDW 17.2 (*)    Monocytes Absolute 1.9  (*)    Abs Immature Granulocytes 0.55 (*)    All other components within normal limits  BASIC METABOLIC PANEL - Abnormal; Notable for the following components:   Sodium 130 (*)    CO2 18 (*)    Glucose, Bld 150 (*)    Calcium 8.1 (*)    All other components within normal limits  CBC - Abnormal; Notable for the following components:   RBC 2.52 (*)    Hemoglobin 7.7 (*)  HCT 24.5 (*)    RDW 17.4 (*)    Platelets 137 (*)    All other components within normal limits  CBC - Abnormal; Notable for the following components:   RBC 1.88 (*)    Hemoglobin 5.9 (*)    HCT 19.1 (*)    MCV 101.6 (*)    RDW 17.2 (*)    Platelets 130 (*)    nRBC 0.3 (*)    All other components within normal limits  CREATININE, SERUM - Abnormal; Notable for the following components:   Creatinine, Ser 1.03 (*)    All other components within normal limits  LACTATE DEHYDROGENASE - Abnormal; Notable for the following components:   LDH 341 (*)    All other components within normal limits  VITAMIN B12 - Abnormal; Notable for the following components:   Vitamin B-12 920 (*)    All other components within normal limits  FERRITIN - Abnormal; Notable for the following components:   Ferritin 578 (*)    All other components within normal limits  RETICULOCYTES - Abnormal; Notable for the following components:   Retic Ct Pct 6.2 (*)    RBC. 1.88 (*)    Immature Retic Fract 25.0 (*)    All other components within normal limits  CBC - Abnormal; Notable for the following components:   RBC 2.09 (*)    Hemoglobin 6.5 (*)    HCT 21.1 (*)    MCV 101.0 (*)    RDW 17.1 (*)    Platelets 149 (*)    nRBC 0.4 (*)    All other components within normal limits  CULTURE, BLOOD (ROUTINE X 2)  CULTURE, BLOOD (ROUTINE X 2)  URINE CULTURE  PNEUMOCYSTIS JIROVECI SMEAR BY DFA  LACTIC ACID, PLASMA  URINALYSIS, ROUTINE W REFLEX MICROSCOPIC  FOLATE  IRON AND TIBC  BRAIN NATRIURETIC PEPTIDE  CBC  TYPE AND SCREEN  ABO/RH    PREPARE RBC (CROSSMATCH)    EKG EKG Interpretation  Date/Time:  Friday March 08 2018 15:11:10 EST Ventricular Rate:  125 PR Interval:    QRS Duration: 80 QT Interval:  302 QTC Calculation: 436 R Axis:   67 Text Interpretation:  Sinus tachycardia No acute changes No significant change since last tracing Confirmed by Varney Biles ([id number]) on 03/08/2018 5:25:38 PM   Radiology Dg Chest 2 View  Result Date: 03/08/2018 CLINICAL DATA:  Cough for approximately 1 month. EXAM: CHEST - 2 VIEW COMPARISON:  PA and lateral chest 02/11/2018, 06/03/2017 and 10/25/2017. FINDINGS: Hazy bilateral airspace disease is present bilaterally and new since the most recent exam. Heart size is normal. No pneumothorax or pleural effusion. No acute or focal bony abnormality. IMPRESSION: Diffuse hazy bilateral airspace disease is new since the most recent examination and worrisome for pneumonia, possibly atypical. Electronically Signed   By: Inge Rise M.D.   On: 03/08/2018 15:54    Procedures .Critical Care Performed by: Varney Biles, MD Authorized by: Varney Biles, MD   Critical care provider statement:    Critical care time (minutes):  35   Critical care time was exclusive of:  Separately billable procedures and treating other patients   Critical care was necessary to treat or prevent imminent or life-threatening deterioration of the following conditions:  Sepsis and circulatory failure   Critical care was time spent personally by me on the following activities:  Discussions with consultants, evaluation of patient's response to treatment, examination of patient, ordering and performing treatments and interventions, ordering and review of laboratory  studies, ordering and review of radiographic studies, pulse oximetry, re-evaluation of patient's condition, obtaining history from patient or surrogate and review of old charts   (including critical care time)  Medications Ordered in  ED Medications  azithromycin (ZITHROMAX) 500 mg in sodium chloride 0.9 % 250 mL IVPB ( Intravenous Stopped 03/09/18 1733)  bictegravir-emtricitabine-tenofovir AF (BIKTARVY) 50-200-25 MG per tablet 1 tablet (1 tablet Oral Given 03/09/18 2251)  acetaminophen (TYLENOL) tablet 650 mg (has no administration in time range)    Or  acetaminophen (TYLENOL) suppository 650 mg (has no administration in time range)  ondansetron (ZOFRAN) tablet 4 mg (has no administration in time range)    Or  ondansetron (ZOFRAN) injection 4 mg (has no administration in time range)  sulfamethoxazole-trimethoprim (BACTRIM) 320 mg in dextrose 5 % 500 mL IVPB (0 mg Intravenous Duplicate 06/10/54 2563)  cefTRIAXone (ROCEPHIN) 1 g in sodium chloride 0.9 % 100 mL IVPB (1 g Intravenous New Bag/Given 03/09/18 1738)  lactated ringers bolus 1,000 mL (0 mLs Intravenous Stopped 03/08/18 1848)    And  lactated ringers bolus 500 mL (0 mLs Intravenous Stopped 03/08/18 1922)    And  lactated ringers bolus 250 mL (0 mLs Intravenous Stopped 03/08/18 1923)  vancomycin (VANCOCIN) IVPB 1000 mg/200 mL premix (0 mg Intravenous Stopped 03/08/18 2034)  sulfamethoxazole-trimethoprim (BACTRIM DS,SEPTRA DS) 800-160 MG per tablet 1 tablet (1 tablet Oral Given 03/08/18 1935)  0.9 %  sodium chloride infusion (Manually program via Guardrails IV Fluids) ( Intravenous Stopped 03/09/18 0432)     Initial Impression / Assessment and Plan / ED Course  I have reviewed the triage vital signs and the nursing notes.  Pertinent labs & imaging results that were available during my care of the patient were reviewed by me and considered in my medical decision making (see chart for details).     33 year old female comes in with chief complaint of exertional dyspnea.  She is got history of HIV-AIDS and is on Bactrim prophylactically.  She has history of PCP pneumonia.  On exam patient is tachycardic and mildly tachypneic.  She is not hypoxic and lung exam reveals bibasilar  rales.  Patient is already on Augmentin and azithromycin without significant improvement.  Differential diagnosis includes.  Other possibilities include multidrug-resistant pneumonia and severe PCP pneumonia.  Viral infection is also possible.  We will call infectious disease doctor to be on board, but patient will need admission to the hospital for optimization.  Final Clinical Impressions(s) / ED Diagnoses   Final diagnoses:  Acute respiratory distress  Community acquired pneumonia, unspecified laterality    ED Discharge Orders    None       Varney Biles, MD 03/10/18 [phone]

## 2018-03-08 NOTE — ED Notes (Signed)
Bed: WA10 Expected date:  Expected time:  Means of arrival:  Comments: 

## 2018-03-09 DIAGNOSIS — B20 Human immunodeficiency virus [HIV] disease: Principal | ICD-10-CM

## 2018-03-09 LAB — BASIC METABOLIC PANEL WITH GFR
Anion gap: 9 (ref 5–15)
BUN: 10 mg/dL (ref 6–20)
CO2: 18 mmol/L — ABNORMAL LOW (ref 22–32)
Calcium: 8.1 mg/dL — ABNORMAL LOW (ref 8.9–10.3)
Chloride: 103 mmol/L (ref 98–111)
Creatinine, Ser: 0.95 mg/dL (ref 0.44–1.00)
GFR calc Af Amer: >60 mL/min
GFR calc non Af Amer: >60 mL/min
Glucose, Bld: 150 mg/dL — ABNORMAL HIGH (ref 70–99)
Potassium: 4.1 mmol/L (ref 3.5–5.1)
Sodium: 130 mmol/L — ABNORMAL LOW (ref 135–145)

## 2018-03-09 LAB — RESPIRATORY PANEL BY PCR

## 2018-03-09 LAB — CBC
HCT: 24.5 % — ABNORMAL LOW (ref 36.0–46.0)
Hemoglobin: 7.7 g/dL — ABNORMAL LOW (ref 12.0–15.0)
MCH: 30.6 pg (ref 26.0–34.0)
MCHC: 31.4 g/dL (ref 30.0–36.0)
MCV: 97.2 fL (ref 80.0–100.0)
Platelets: 137 K/uL — ABNORMAL LOW (ref 150–400)
RBC: 2.52 MIL/uL — ABNORMAL LOW (ref 3.87–5.11)
RDW: 17.4 % — ABNORMAL HIGH (ref 11.5–15.5)
WBC: 9 K/uL (ref 4.0–10.5)
nRBC: 0.2 % (ref 0.0–0.2)

## 2018-03-09 LAB — PREPARE RBC (CROSSMATCH)

## 2018-03-09 LAB — ABO/RH: ABO/RH(D): O POS

## 2018-03-09 MED ORDER — SODIUM CHLORIDE 0.9 % IV SOLN
1.0000 g | INTRAVENOUS | Status: DC
  Administered 2018-03-09: 1 g via INTRAVENOUS
  Filled 2018-03-09: qty 10
  Filled 2018-03-09: qty 1

## 2018-03-09 NOTE — Progress Notes (Signed)
PROGRESS NOTE    Krystal Short  RAQ:[id number] DOB: 03-Nov-1985 DOA: 03/08/2018 PCP: Dianna Rossetti, NP    Brief Narrative:  33 y.o. female with history of AIDS last CD4 count was 50 December 17, 2017 presents to the ER with ongoing shortness of breath and weakness.  Patient states her symptoms started a month ago and had been to her PCP on February 10, 2018 and had checked for flu which was negative.  Patient had persistent symptoms shortness of breath on exertion had fever chills sweats and productive cough.  Had gone to her infectious disease clinic on March 04, 2018 and was prescribed Zithromax and Augmentin for sinusitis and possible pneumonia.  At the time patient also was noticed to be tachycardic.  Since patient symptoms persisted patient came to the ER.  ED Course: In the ER patient was tachycardic with heart rate around 120s hemoglobin was around 8.9 dropped from 9.9 and November and patient is observed to have progressive decrease in hemoglobin.  Macrocytic picture.  Patient states he does not have menstrual cycle since she is getting Depo shot and EKG shows sinus tachycardia.  Chest x-ray shows bilateral haziness concerning for atypical pneumonia.  Patient started on empiric antibiotics for community-acquired pneumonia and also Bactrim added for possible pneumocystis.  Patient is not hypoxic at this time.  Patient admitted for pneumonia and progressive anemia.  Assessment & Plan:   Principal Problem:   Acute respiratory failure with hypoxia (HCC) Active Problems:   AIDS ([place])   CAP (community acquired pneumonia)  1. Acute respiratory failure with hypoxia secondary to pneumonia, rhinovirus 1. patient on ceftriaxone and Zithromax.   2. Bactrim was added per pharmacy for now for covering pneumocystis.  3. sputum cultures blood cultures urine for Legionella strep antigen. 4. Respiratory viral panel pos for rhinovirus 2. Macrocytic anemia progressively worsening 1. patient denies  any obvious GI bleed symptoms or any menorrhagia.   2. Iron level normal 3. Suspect marrow suppression related to AIDS. 3. AIDS on antiretroviral and prophylactic medications. 1. Stable at present  DVT prophylaxis: SCD's Code Status: Full Family Communication: Pt in room, family at bedside Disposition Plan: Uncertain at this time  Consultants:     Procedures:     Antimicrobials: Anti-infectives (From admission, onward)   Start     Dose/Rate Route Frequency Ordered Stop   03/09/18 1700  cefTRIAXone (ROCEPHIN) 1 g in sodium chloride 0.9 % 100 mL IVPB     1 g 200 mL/hr over 30 Minutes Intravenous Every 24 hours 03/09/18 1315     03/09/18 1000  bictegravir-emtricitabine-tenofovir AF (BIKTARVY) 50-200-25 MG per tablet 1 tablet     1 tablet Oral Daily 03/08/18 2046     03/08/18 2200  sulfamethoxazole-trimethoprim (BACTRIM) 320 mg in dextrose 5 % 500 mL IVPB     320 mg 346.7 mL/hr over 90 Minutes Intravenous Every 8 hours 03/08/18 2101     03/08/18 1930  sulfamethoxazole-trimethoprim (BACTRIM DS,SEPTRA DS) 800-160 MG per tablet 1 tablet     1 tablet Oral  Once 03/08/18 1920 03/08/18 1935   03/08/18 1700  vancomycin (VANCOCIN) IVPB 1000 mg/200 mL premix     1,000 mg 200 mL/hr over 60 Minutes Intravenous  Once 03/08/18 1654 03/08/18 2034   03/08/18 1700  cefTRIAXone (ROCEPHIN) 2 g in sodium chloride 0.9 % 100 mL IVPB  Status:  Discontinued     2 g 200 mL/hr over 30 Minutes Intravenous Every 24 hours 03/08/18 1654 03/09/18 1315   03/08/18 1700  azithromycin (ZITHROMAX) 500 mg in sodium chloride 0.9 % 250 mL IVPB     500 mg 250 mL/hr over 60 Minutes Intravenous Every 24 hours 03/08/18 1654         Subjective: Reports feeling better. Sneezing, runny nose  Objective: Vitals:   03/09/18 0031 03/09/18 0050 03/09/18 0100 03/09/18 0430  BP: (!) 90/56 100/68  92/70  Pulse: 99 (!) 101 91 91  Resp: 14 16  18   Temp: 98.2 F (36.8 C) 98.1 F (36.7 C)  98.3 F (36.8 C)  TempSrc:  Oral Oral  Oral  SpO2: 100% 100%  100%  Weight:      Height:        Intake/Output Summary (Last 24 hours) at 03/09/2018 1358 Last data filed at 03/09/2018 0600 Gross per 24 hour  Intake 3454.16 ml  Output -  Net 3454.16 ml   Filed Weights   03/08/18 2359  Weight: 53 kg    Examination:  General exam: Appears calm and comfortable  Respiratory system: Clear to auscultation. Respiratory effort normal. Cardiovascular system: S1 & S2 heard, RRR Gastrointestinal system: Abdomen is nondistended, soft and nontender. No organomegaly or masses felt. Normal bowel sounds heard. Central nervous system: Alert and oriented. No focal neurological deficits. Extremities: Symmetric 5 x 5 power. Skin: No rashes, lesions Psychiatry: Judgement and insight appear normal. Mood & affect appropriate.   Data Reviewed: I have personally reviewed following labs and imaging studies  CBC: Recent Labs  Lab 03/08/18 1722 03/08/18 2129 03/08/18 2222 03/09/18 0714  WBC 9.5 8.6 8.2 9.0  NEUTROABS 5.3  --   --   --   HGB 8.9* 5.9* 6.5* 7.7*  HCT 29.9* 19.1* 21.1* 24.5*  MCV 101.7* 101.6* 101.0* 97.2  PLT 183 130* 149* 761*   Basic Metabolic Panel: Recent Labs  Lab 03/08/18 1722 03/08/18 2129 03/09/18 0714  NA 130*  --  130*  K 5.2*  --  4.1  CL 99  --  103  CO2 19*  --  18*  GLUCOSE 84  --  150*  BUN 16  --  10  CREATININE 1.22* 1.03* 0.95  CALCIUM 9.3  --  8.1*   GFR: Estimated Creatinine Clearance: 65.3 mL/min (by C-G formula based on SCr of 0.95 mg/dL). Liver Function Tests: Recent Labs  Lab 03/08/18 1722  AST 37  ALT 20  ALKPHOS 95  BILITOT 0.6  PROT 9.8*  ALBUMIN 3.5   No results for input(s): LIPASE, AMYLASE in the last 168 hours. No results for input(s): AMMONIA in the last 168 hours. Coagulation Profile: No results for input(s): INR, PROTIME in the last 168 hours. Cardiac Enzymes: No results for input(s): CKTOTAL, CKMB, CKMBINDEX, TROPONINI in the last 168 hours. BNP  (last 3 results) No results for input(s): PROBNP in the last 8760 hours. HbA1C: No results for input(s): HGBA1C in the last 72 hours. CBG: No results for input(s): GLUCAP in the last 168 hours. Lipid Profile: No results for input(s): CHOL, HDL, LDLCALC, TRIG, CHOLHDL, LDLDIRECT in the last 72 hours. Thyroid Function Tests: No results for input(s): TSH, T4TOTAL, FREET4, T3FREE, THYROIDAB in the last 72 hours. Anemia Panel: Recent Labs    03/08/18 2129  VITAMINB12 920*  FOLATE 8.1  FERRITIN 578*  TIBC 315  IRON 82  RETICCTPCT 6.2*   Sepsis Labs: Recent Labs  Lab 03/08/18 1722  LATICACIDVEN 1.4    Recent Results (from the past 240 hour(s))  Blood Culture (routine x 2)     Status:  None (Preliminary result)   Collection Time: 03/08/18  5:20 PM  Result Value Ref Range Status   Specimen Description BLOOD LEFT ANTECUBITAL  Final   Special Requests   Final    BOTTLES DRAWN AEROBIC AND ANAEROBIC Blood Culture adequate volume Performed at [facility] [address], [place], [place] [address]    Culture NO GROWTH < 12 HOURS  Final   Report Status PENDING  Incomplete  Respiratory Panel by PCR     Status: Abnormal   Collection Time: 03/08/18  5:36 PM  Result Value Ref Range Status   Adenovirus NOT DETECTED NOT DETECTED Final   Coronavirus 229E NOT DETECTED NOT DETECTED Final   Coronavirus HKU1 NOT DETECTED NOT DETECTED Final   Coronavirus NL63 NOT DETECTED NOT DETECTED Final   Coronavirus OC43 NOT DETECTED NOT DETECTED Final   Metapneumovirus NOT DETECTED NOT DETECTED Final   Rhinovirus / Enterovirus DETECTED (A) NOT DETECTED Final   Influenza A NOT DETECTED NOT DETECTED Final   Influenza B NOT DETECTED NOT DETECTED Final   Parainfluenza Virus 1 NOT DETECTED NOT DETECTED Final   Parainfluenza Virus 2 NOT DETECTED NOT DETECTED Final   Parainfluenza Virus 3 NOT DETECTED NOT DETECTED Final   Parainfluenza Virus 4 NOT DETECTED NOT DETECTED Final    Respiratory Syncytial Virus NOT DETECTED NOT DETECTED Final   Bordetella pertussis NOT DETECTED NOT DETECTED Final   Chlamydophila pneumoniae NOT DETECTED NOT DETECTED Final   Mycoplasma pneumoniae NOT DETECTED NOT DETECTED Final    Comment: Performed at [facility], [place] [address], [place], [place] [address]  Blood Culture (routine x 2)     Status: None (Preliminary result)   Collection Time: 03/08/18  5:50 PM  Result Value Ref Range Status   Specimen Description BLOOD LEFT ARM  Final   Special Requests   Final    BOTTLES DRAWN AEROBIC AND ANAEROBIC Blood Culture results may not be optimal due to an excessive volume of blood received in culture bottles Performed at [facility], [place] [address] [place], [place] [address]    Culture NO GROWTH < 12 HOURS  Final   Report Status PENDING  Incomplete     Radiology Studies: Dg Chest 2 View  Result Date: 03/08/2018 CLINICAL DATA:  Cough for approximately 1 month. EXAM: CHEST - 2 VIEW COMPARISON:  PA and lateral chest 02/11/2018, 06/03/2017 and 10/25/2017. FINDINGS: Hazy bilateral airspace disease is present bilaterally and new since the most recent exam. Heart size is normal. No pneumothorax or pleural effusion. No acute or focal bony abnormality. IMPRESSION: Diffuse hazy bilateral airspace disease is new since the most recent examination and worrisome for pneumonia, possibly atypical. Electronically Signed   By: Inge Rise M.D.   On: 03/08/2018 15:54    Scheduled Meds: . bictegravir-emtricitabine-tenofovir AF  1 tablet Oral Daily   Continuous Infusions: . azithromycin Stopped (03/08/18 1954)  . cefTRIAXone (ROCEPHIN)  IV    . sulfamethoxazole-trimethoprim 320 mg (03/09/18 0554)     LOS: 1 day   Marylu Lund, MD [facility] Pager On Amion  If 7PM-7AM, please contact night-coverage 03/09/2018, 1:58 PM

## 2018-03-10 LAB — TYPE AND SCREEN
ABO/RH(D): O POS
Antibody Screen: NEGATIVE
Unit division: 0

## 2018-03-10 LAB — CBC
HCT: 26.8 % — ABNORMAL LOW (ref 36.0–46.0)
Hemoglobin: 8.2 g/dL — ABNORMAL LOW (ref 12.0–15.0)
MCH: 29.7 pg (ref 26.0–34.0)
MCHC: 30.6 g/dL (ref 30.0–36.0)
MCV: 97.1 fL (ref 80.0–100.0)
Platelets: 156 10*3/uL (ref 150–400)
RBC: 2.76 MIL/uL — ABNORMAL LOW (ref 3.87–5.11)
RDW: 18.7 % — ABNORMAL HIGH (ref 11.5–15.5)
WBC: 7.8 10*3/uL (ref 4.0–10.5)
nRBC: 0 % (ref 0.0–0.2)

## 2018-03-10 LAB — BPAM RBC
Blood Product Expiration Date: 202003012359
ISSUE DATE / TIME: 202002010029
UNIT TYPE AND RH: 5100

## 2018-03-10 LAB — URINE CULTURE: Culture: NO GROWTH

## 2018-03-10 MED ORDER — AZITHROMYCIN 250 MG PO TABS: ORAL_TABLET | ORAL | 0 refills | Status: AC

## 2018-03-10 MED ORDER — CEFDINIR 300 MG PO CAPS: 300.0000 mg | ORAL_CAPSULE | Freq: Two times a day (BID) | ORAL | 0 refills | Status: AC

## 2018-03-10 NOTE — Discharge Summary (Signed)
Physician Discharge Summary  Krystal Short UMP:[id number] DOB: 1985-07-13 DOA: 03/08/2018  PCP: Dianna Rossetti, NP  Admit date: 03/08/2018 Discharge date: 03/10/2018  Admitted From: Home Disposition:  Home  Recommendations for Outpatient Follow-up:  1. Follow up with PCP in 1-2 weeks  Discharge Condition:Improved CODE STATUS:Full Diet recommendation: Regular   Brief/Interim Summary: 33 y.o.femalewithhistory of AIDS last CD4 count was 50 December 17, 2017 presents to the ER with ongoing shortness of breath and weakness. Patient states her symptoms started a month ago and had been to her PCP on February 10, 2018 and had checked for flu which was negative. Patient had persistent symptoms shortness of breath on exertion had fever chills sweats and productive cough. Had gone to her infectious disease clinic on March 04, 2018 and was prescribed Zithromax and Augmentin for sinusitis and possible pneumonia. At the time patient also was noticed to be tachycardic. Since patient symptoms persisted patient came to the ER.  ED Course:In the ER patient was tachycardic with heart rate around 120s hemoglobin was around 8.9 dropped from 9.9 and November and patient is observed to have progressive decrease in hemoglobin. Macrocytic picture. Patient states he does not have menstrual cycle since she is getting Depo shot and EKG shows sinus tachycardia. Chest x-ray shows bilateral haziness concerning for atypical pneumonia. Patient started on empiric antibiotics for community-acquired pneumonia and also Bactrim added for possible pneumocystis. Patient is not hypoxic at this time. Patient admitted for pneumonia and progressive anemia.  Discharge Diagnoses:  Principal Problem:   Acute respiratory failure with hypoxia ([place]) Active Problems:   AIDS ([place])   CAP (community acquired pneumonia)   1. Acute respiratory failure with hypoxia secondary to pneumonia, rhinovirus 1. patient on ceftriaxone  and Zithromax.  2. Bactrim was added per pharmacy for now for covering pneumocystis.  3. Respiratory viral panel pos for rhinovirus 4. Clinically improved 5. Will discharge on azithromycin and omicef to complete course 2. Macrocytic anemia progressively worsening 1. patient denies any obvious GI bleed symptoms or any menorrhagia.  2. Iron level normal 3. Suspect marrow suppression related to AIDS. 4. Hgb now trending up without further intervention 3. AIDS on antiretroviral and prophylactic medications. 1. Stable at present  Discharge Instructions   Allergies as of 03/10/2018   No Known Allergies     Medication List    STOP taking these medications   amoxicillin-clavulanate 875-125 MG tablet Commonly known as:  AUGMENTIN   metroNIDAZOLE 500 MG tablet Commonly known as:  FLAGYL   terconazole 0.4 % vaginal cream Commonly known as:  TERAZOL 7     TAKE these medications   azithromycin 250 MG tablet Commonly known as:  ZITHROMAX Z-PAK Take one tab daily x 3 more days   bictegravir-emtricitabine-tenofovir AF 50-200-25 MG Tabs tablet Commonly known as:  BIKTARVY Take 1 tablet by mouth daily.   cefdinir 300 MG capsule Commonly known as:  OMNICEF Take 1 capsule (300 mg total) by mouth 2 (two) times daily for 3 days.   imiquimod 5 % cream Commonly known as:  ALDARA Apply 1 application topically See admin instructions. Every 3 days   sulfamethoxazole-trimethoprim 800-160 MG tablet Commonly known as:  BACTRIM DS,SEPTRA DS Take 1 tablet by mouth daily. Start at completion of other prescription.      Follow-up Information    Dianna Rossetti, NP. Schedule an appointment as soon as possible for a visit in 1 week(s).   Specialty:  Nurse Practitioner Contact information: [address] [place]  [address] [phone]          No Known Allergies  Procedures/Studies: Dg Chest 2 View  Result Date: 03/08/2018 CLINICAL DATA:  Cough for approximately  1 month. EXAM: CHEST - 2 VIEW COMPARISON:  PA and lateral chest 02/11/2018, 06/03/2017 and 10/25/2017. FINDINGS: Hazy bilateral airspace disease is present bilaterally and new since the most recent exam. Heart size is normal. No pneumothorax or pleural effusion. No acute or focal bony abnormality. IMPRESSION: Diffuse hazy bilateral airspace disease is new since the most recent examination and worrisome for pneumonia, possibly atypical. Electronically Signed   By: Inge Rise M.D.   On: 03/08/2018 15:54   Dg Chest 2 View  Result Date: 02/11/2018 CLINICAL DATA:  33 year old with cough. EXAM: CHEST - 2 VIEW COMPARISON:  10/25/2017 FINDINGS: The heart size and mediastinal contours are within normal limits. Both lungs are clear. The visualized skeletal structures are unremarkable. No pleural effusions. IMPRESSION: No active cardiopulmonary disease. Electronically Signed   By: Markus Daft M.D.   On: 02/11/2018 11:24    Subjective: Eager to go home  Discharge Exam: Vitals:   03/10/18 0519 03/10/18 0527  BP: (!) 81/63 102/67  Pulse: 85 81  Resp: 18   Temp: (!) 97.5 F (36.4 C)   SpO2: 100%    Vitals:   03/09/18 1455 03/09/18 2053 03/10/18 0519 03/10/18 0527  BP: 98/62 97/72 (!) 81/63 102/67  Pulse: 90 96 85 81  Resp: 16 20 18    Temp: 98 F (36.7 C) 98 F (36.7 C) (!) 97.5 F (36.4 C)   TempSrc: Oral Oral Oral   SpO2: 100% 95% 100%   Weight:      Height:        General: Pt is alert, awake, not in acute distress Cardiovascular: RRR, S1/S2 +, no rubs, no gallops Respiratory: CTA bilaterally, no wheezing, no rhonchi Abdominal: Soft, NT, ND, bowel sounds + Extremities: no edema, no cyanosis   The results of significant diagnostics from this hospitalization (including imaging, microbiology, ancillary and laboratory) are listed below for reference.     Microbiology: Recent Results (from the past 240 hour(s))  Blood Culture (routine x 2)     Status: None (Preliminary result)    Collection Time: 03/08/18  5:20 PM  Result Value Ref Range Status   Specimen Description BLOOD LEFT ANTECUBITAL  Final   Special Requests   Final    BOTTLES DRAWN AEROBIC AND ANAEROBIC Blood Culture adequate volume Performed at [facility] [address] [place], [place] [address]    Culture NO GROWTH 2 DAYS  Final   Report Status PENDING  Incomplete  Urine culture     Status: None   Collection Time: 03/08/18  5:22 PM  Result Value Ref Range Status   Specimen Description   Final    URINE, RANDOM Performed at [facility], [place] [address], [place], [place] [address]    Special Requests   Final    NONE Performed at [facility], [place] [address], [place], [place] [address]    Culture   Final    NO GROWTH Performed at [facility], [place] [address] [place], [place] [address]    Report Status 03/10/2018 FINAL  Final  Respiratory Panel by PCR     Status: Abnormal   Collection Time: 03/08/18  5:36 PM  Result Value Ref Range Status   Adenovirus NOT DETECTED NOT DETECTED Final   Coronavirus 229E NOT DETECTED NOT DETECTED  Final   Coronavirus HKU1 NOT DETECTED NOT DETECTED Final   Coronavirus NL63 NOT DETECTED NOT DETECTED Final   Coronavirus OC43 NOT DETECTED NOT DETECTED Final   Metapneumovirus NOT DETECTED NOT DETECTED Final   Rhinovirus / Enterovirus DETECTED (A) NOT DETECTED Final   Influenza A NOT DETECTED NOT DETECTED Final   Influenza B NOT DETECTED NOT DETECTED Final   Parainfluenza Virus 1 NOT DETECTED NOT DETECTED Final   Parainfluenza Virus 2 NOT DETECTED NOT DETECTED Final   Parainfluenza Virus 3 NOT DETECTED NOT DETECTED Final   Parainfluenza Virus 4 NOT DETECTED NOT DETECTED Final   Respiratory Syncytial Virus NOT DETECTED NOT DETECTED Final   Bordetella pertussis NOT DETECTED NOT DETECTED Final   Chlamydophila pneumoniae NOT DETECTED NOT DETECTED Final   Mycoplasma pneumoniae NOT DETECTED NOT DETECTED  Final    Comment: Performed at [facility], [place] [address], [place], [place] [address]  Blood Culture (routine x 2)     Status: None (Preliminary result)   Collection Time: 03/08/18  5:50 PM  Result Value Ref Range Status   Specimen Description BLOOD LEFT ARM  Final   Special Requests   Final    BOTTLES DRAWN AEROBIC AND ANAEROBIC Blood Culture results may not be optimal due to an excessive volume of blood received in culture bottles Performed at [facility], [place] [address] [place], [place] [address]    Culture NO GROWTH 2 DAYS  Final   Report Status PENDING  Incomplete     Labs: BNP (last 3 results) Recent Labs    06/04/17 1633 03/08/18 2129  BNP 118.3* 8.8   Basic Metabolic Panel: Recent Labs  Lab 03/08/18 1722 03/08/18 2129 03/09/18 0714  NA 130*  --  130*  K 5.2*  --  4.1  CL 99  --  103  CO2 19*  --  18*  GLUCOSE 84  --  150*  BUN 16  --  10  CREATININE 1.22* 1.03* 0.95  CALCIUM 9.3  --  8.1*   Liver Function Tests: Recent Labs  Lab 03/08/18 1722  AST 37  ALT 20  ALKPHOS 95  BILITOT 0.6  PROT 9.8*  ALBUMIN 3.5   No results for input(s): LIPASE, AMYLASE in the last 168 hours. No results for input(s): AMMONIA in the last 168 hours. CBC: Recent Labs  Lab 03/08/18 1722 03/08/18 2129 03/08/18 2222 03/09/18 0714 03/10/18 0524  WBC 9.5 8.6 8.2 9.0 7.8  NEUTROABS 5.3  --   --   --   --   HGB 8.9* 5.9* 6.5* 7.7* 8.2*  HCT 29.9* 19.1* 21.1* 24.5* 26.8*  MCV 101.7* 101.6* 101.0* 97.2 97.1  PLT 183 130* 149* 137* 156   Cardiac Enzymes: No results for input(s): CKTOTAL, CKMB, CKMBINDEX, TROPONINI in the last 168 hours. BNP: Invalid input(s): POCBNP CBG: No results for input(s): GLUCAP in the last 168 hours. D-Dimer No results for input(s): DDIMER in the last 72 hours. Hgb A1c No results for input(s): HGBA1C in the last 72 hours. Lipid Profile No results for input(s): CHOL, HDL, LDLCALC, TRIG, CHOLHDL, LDLDIRECT in the  last 72 hours. Thyroid function studies No results for input(s): TSH, T4TOTAL, T3FREE, THYROIDAB in the last 72 hours.  Invalid input(s): FREET3 Anemia work up Recent Labs    03/08/18 2129  VITAMINB12 920*  FOLATE 8.1  FERRITIN 578*  TIBC 315  IRON 82  RETICCTPCT 6.2*   Urinalysis    Component Value Date/Time   COLORURINE  YELLOW 03/08/2018 [place] 03/08/2018 1722   LABSPEC 1.015 03/08/2018 1722   PHURINE 6.0 03/08/2018 1722   GLUCOSEU NEGATIVE 03/08/2018 1722   HGBUR NEGATIVE 03/08/2018 1722   BILIRUBINUR NEGATIVE 03/08/2018 1722   KETONESUR NEGATIVE 03/08/2018 1722   PROTEINUR NEGATIVE 03/08/2018 1722   UROBILINOGEN 1.0 12/03/2011 0550   NITRITE NEGATIVE 03/08/2018 1722   LEUKOCYTESUR NEGATIVE 03/08/2018 1722   Sepsis Labs Invalid input(s): PROCALCITONIN,  WBC,  LACTICIDVEN Microbiology Recent Results (from the past 240 hour(s))  Blood Culture (routine x 2)     Status: None (Preliminary result)   Collection Time: 03/08/18  5:20 PM  Result Value Ref Range Status   Specimen Description BLOOD LEFT ANTECUBITAL  Final   Special Requests   Final    BOTTLES DRAWN AEROBIC AND ANAEROBIC Blood Culture adequate volume Performed at [facility], [place] [address] [place], [place] [address]    Culture NO GROWTH 2 DAYS  Final   Report Status PENDING  Incomplete  Urine culture     Status: None   Collection Time: 03/08/18  5:22 PM  Result Value Ref Range Status   Specimen Description   Final    URINE, RANDOM Performed at [facility], [place] [address], [place], [place] [address]    Special Requests   Final    NONE Performed at [facility], [place] [address], [place], [place] [address]    Culture   Final    NO GROWTH Performed at [facility], [place] [address], [place], [place] [address]    Report Status 03/10/2018 FINAL  Final  Respiratory Panel by PCR     Status: Abnormal   Collection Time:  03/08/18  5:36 PM  Result Value Ref Range Status   Adenovirus NOT DETECTED NOT DETECTED Final   Coronavirus 229E NOT DETECTED NOT DETECTED Final   Coronavirus HKU1 NOT DETECTED NOT DETECTED Final   Coronavirus NL63 NOT DETECTED NOT DETECTED Final   Coronavirus OC43 NOT DETECTED NOT DETECTED Final   Metapneumovirus NOT DETECTED NOT DETECTED Final   Rhinovirus / Enterovirus DETECTED (A) NOT DETECTED Final   Influenza A NOT DETECTED NOT DETECTED Final   Influenza B NOT DETECTED NOT DETECTED Final   Parainfluenza Virus 1 NOT DETECTED NOT DETECTED Final   Parainfluenza Virus 2 NOT DETECTED NOT DETECTED Final   Parainfluenza Virus 3 NOT DETECTED NOT DETECTED Final   Parainfluenza Virus 4 NOT DETECTED NOT DETECTED Final   Respiratory Syncytial Virus NOT DETECTED NOT DETECTED Final   Bordetella pertussis NOT DETECTED NOT DETECTED Final   Chlamydophila pneumoniae NOT DETECTED NOT DETECTED Final   Mycoplasma pneumoniae NOT DETECTED NOT DETECTED Final    Comment: Performed at [facility], [place] [address], [place], [place] [address]  Blood Culture (routine x 2)     Status: None (Preliminary result)   Collection Time: 03/08/18  5:50 PM  Result Value Ref Range Status   Specimen Description BLOOD LEFT ARM  Final   Special Requests   Final    BOTTLES DRAWN AEROBIC AND ANAEROBIC Blood Culture results may not be optimal due to an excessive volume of blood received in culture bottles Performed at [facility], [place] [address] [place], [place] [address]    Culture NO GROWTH 2 DAYS  Final   Report Status PENDING  Incomplete   Time spent: 30 min  SIGNED:   Marylu Lund, MD  [facility] 03/10/2018, 10:32 AM  If 7PM-7AM, please contact night-coverage

## 2018-03-10 NOTE — Plan of Care (Signed)
Discharge instructions reviewed with patient and significant other, questions answered, verbalized understanding.  Hard copy scripts given to patient for antibiotics with instructions to take all prescribed doses.

## 2018-03-11 ENCOUNTER — Other Ambulatory Visit: Payer: Self-pay | Admitting: *Deleted

## 2018-03-11 ENCOUNTER — Encounter: Payer: Self-pay | Admitting: *Deleted

## 2018-03-11 ENCOUNTER — Ambulatory Visit: Payer: No Typology Code available for payment source | Admitting: Obstetrics & Gynecology

## 2018-03-11 NOTE — Patient Outreach (Signed)
[facility]) Care Management  03/11/2018  Krystal Short 04-12-1985 [id number]   Subjective: Telephone call to patient's home / mobile number, spoke with patient, and HIPAA verified.  Discussed [facility] Care Management UMR Transition of care follow up, patient voiced understanding, and is in agreement to follow up.    Patient states she is feeling better, has a follow up appointment with primary provider on 03/18/2018, and per chart with infectious MD on 04/22/2018.  Patient states she is able to manage self care and has assistance as needed. Patient voices understanding of medical diagnosis and treatment plan.  States she is accessing the following [facility] benefits: outpatient pharmacy, hospital indemnity (not sure if chosen, (verbally given contact number for [employer] [phone], will file claim if appropriate, verbally given contact number for [place] [employer] [phone] to request itemized bill), and will call [employer] today to update family medical leave act (FMLA) claim.  Discussed Advanced Directives, advised of [facility] [employer] Advanced Directives document completion benefit, patient voices understanding, and  declined to access benefit at this time.  States she is very appreciative of the follow up and is in agreement to receive [facility] [employer] information.      Objective: Per KPN (Knowledge Performance Now, point of care tool) and chart review, patient hospitalized 03/08/2018 -03/10/2018 for CAP (community acquired pneumonia), Acute respiratory failure with hypoxia.  Patient also has a history of AIDS.       Assessment: Received UMR Transition of care follow up referral on 03/11/2018.   Transition of care follow up completed, no care management needs, and will proceed with case closure.      Plan: RNCM will send patient successful outreach letter, [facility] pamphlet, and magnet. RNCM will complete case closure due to follow up completed / no care management  needs.       Olanrewaju Osborn H. Annia Friendly, BSN, [place] Management [facility] Telephonic CM Phone: [phone] Fax: [phone]

## 2018-03-13 LAB — CULTURE, BLOOD (ROUTINE X 2)
CULTURE: NO GROWTH
Culture: NO GROWTH
Special Requests: ADEQUATE

## 2018-03-15 MED FILL — SULFAMETHOXAZOLE-TMP DS TAB: 800-160 | 30 days supply | Qty: 30 | Fill #0

## 2018-03-15 MED FILL — BIKTARVY 50-200-25 MG TABS: 50-200-25 | 30 days supply | Qty: 30 | Fill #1

## 2018-03-18 DIAGNOSIS — I950 Idiopathic hypotension: Secondary | ICD-10-CM | POA: Diagnosis not present

## 2018-03-18 DIAGNOSIS — J189 Pneumonia, unspecified organism: Secondary | ICD-10-CM | POA: Diagnosis not present

## 2018-03-25 ENCOUNTER — Encounter: Payer: Self-pay | Admitting: Obstetrics & Gynecology

## 2018-03-25 ENCOUNTER — Other Ambulatory Visit (HOSPITAL_COMMUNITY)
Admission: RE | Admit: 2018-03-25 | Discharge: 2018-03-25 | Disposition: A | Discharge status: Home / Self Care | Payer: 59 | Source: Ambulatory Visit | Attending: Obstetrics & Gynecology | Admitting: Obstetrics & Gynecology

## 2018-03-25 ENCOUNTER — Other Ambulatory Visit: Payer: Self-pay

## 2018-03-25 ENCOUNTER — Ambulatory Visit (INDEPENDENT_AMBULATORY_CARE_PROVIDER_SITE_OTHER): Payer: 59 | Admitting: Obstetrics & Gynecology

## 2018-03-25 ENCOUNTER — Encounter: Payer: Self-pay | Admitting: *Deleted

## 2018-03-25 VITALS — BP 103/66 | HR 59 | Wt 123.6 lb

## 2018-03-25 DIAGNOSIS — N871 Moderate cervical dysplasia: Secondary | ICD-10-CM | POA: Diagnosis not present

## 2018-03-25 DIAGNOSIS — R87613 High grade squamous intraepithelial lesion on cytologic smear of cervix (HGSIL): Secondary | ICD-10-CM

## 2018-03-25 DIAGNOSIS — D069 Carcinoma in situ of cervix, unspecified: Secondary | ICD-10-CM | POA: Diagnosis not present

## 2018-03-25 LAB — POCT PREGNANCY, URINE: Preg Test, Ur: NEGATIVE

## 2018-03-25 NOTE — Progress Notes (Signed)
HPI 33 yo P0 here for a colpo. She recently had a HGSIL pap. She has HIV as well.   Review of Systems     Objective:   Physical Exam Breathing, conversing, and ambulating normally Well nourished, well hydrated Black female, no apparent distress UPT negative, consent signed, time out done Cervix prepped with acetic acid. Transformation zone seen in its entirety. Colpo adequate. The ectocervix was normal. A small area of mosacism seen at the opening of the endocervix at the 12 o'clock position.  I biopsied this area. Silver nitrate achieved hemostasis. ECC obtained. She tolerated the procedure well.      Assessment & Plan:  HGSIL on pap, probable CIN 2 on colpo. Await pathology I discussed possible future treatments of cryo and LEEP. I will send her a Mychart message about my recommendation after I review the pathology.

## 2018-04-01 ENCOUNTER — Ambulatory Visit (INDEPENDENT_AMBULATORY_CARE_PROVIDER_SITE_OTHER): Payer: 59 | Admitting: *Deleted

## 2018-04-01 VITALS — BP 116/79 | HR 99 | Temp 98.2°F | Ht 61.5 in | Wt 121.8 lb

## 2018-04-01 DIAGNOSIS — Z3042 Encounter for surveillance of injectable contraceptive: Secondary | ICD-10-CM

## 2018-04-01 MED ORDER — MEDROXYPROGESTERONE ACETATE 150 MG/ML IM SUSP
150.0000 mg | Freq: Once | INTRAMUSCULAR | Status: AC
  Administered 2018-04-01: 150 mg via INTRAMUSCULAR

## 2018-04-01 NOTE — Progress Notes (Signed)
   Pt in for Depo Provera injection.  Pt tolerated Depo injection. Depo given right upper outer quadrant.  Next injection due May 12-26, 2020.  Reminder card given. Derl Barrow, RN

## 2018-04-02 ENCOUNTER — Other Ambulatory Visit: Payer: 59

## 2018-04-02 DIAGNOSIS — B20 Human immunodeficiency virus [HIV] disease: Secondary | ICD-10-CM

## 2018-04-04 LAB — T-HELPER CELL (CD4) - (RCID CLINIC ONLY)
CD4 T CELL HELPER: 2 % — AB (ref 33–55)
CD4 T Cell Abs: 10 /uL — ABNORMAL LOW (ref 400–2700)

## 2018-04-08 LAB — COMPREHENSIVE METABOLIC PANEL
AG RATIO: 0.8 (calc) — AB (ref 1.0–2.5)
ALT: 9 U/L (ref 6–29)
AST: 21 U/L (ref 10–30)
Albumin: 3.6 g/dL (ref 3.6–5.1)
Alkaline phosphatase (APISO): 80 U/L (ref 31–125)
BUN: 10 mg/dL (ref 7–25)
CO2: 22 mmol/L (ref 20–32)
Calcium: 8.3 mg/dL — ABNORMAL LOW (ref 8.6–10.2)
Chloride: 105 mmol/L (ref 98–110)
Creat: 0.78 mg/dL (ref 0.50–1.10)
Globulin: 4.6 g/dL (calc) — ABNORMAL HIGH (ref 1.9–3.7)
Glucose, Bld: 91 mg/dL (ref 65–99)
Potassium: 4.1 mmol/L (ref 3.5–5.3)
SODIUM: 135 mmol/L (ref 135–146)
Total Bilirubin: 0.6 mg/dL (ref 0.2–1.2)
Total Protein: 8.2 g/dL — ABNORMAL HIGH (ref 6.1–8.1)

## 2018-04-08 LAB — LIPID PANEL
Cholesterol: 56 mg/dL (ref <200)
HDL: <5 mg/dL — ABNORMAL LOW (ref >=50)
Triglycerides: 195 mg/dL — ABNORMAL HIGH (ref <150)

## 2018-04-08 LAB — CBC
HCT: 27.2 % — ABNORMAL LOW (ref 35.0–45.0)
HEMOGLOBIN: 9.4 g/dL — AB (ref 11.7–15.5)
MCH: 33.7 pg — ABNORMAL HIGH (ref 27.0–33.0)
MCHC: 34.6 g/dL (ref 32.0–36.0)
MCV: 97.5 fL (ref 80.0–100.0)
MPV: 11 fL (ref 7.5–12.5)
Platelets: 149 10*3/uL (ref 140–400)
RBC: 2.79 10*6/uL — ABNORMAL LOW (ref 3.80–5.10)
RDW: 19 % — AB (ref 11.0–15.0)
WBC: 5.6 10*3/uL (ref 3.8–10.8)

## 2018-04-08 LAB — HIV-1 RNA QUANT-NO REFLEX-BLD
HIV 1 RNA Quant: <20 copies/mL — AB
HIV-1 RNA QUANT, LOG: DETECTED {Log_copies}/mL — AB

## 2018-04-08 LAB — RPR: RPR Ser Ql: NONREACTIVE

## 2018-04-15 MED FILL — BIKTARVY 50-200-25 MG TABS: 50-200-25 | 30 days supply | Qty: 30 | Fill #2 | Status: TO

## 2018-04-15 MED FILL — SULFAMETHOXAZOLE-TMP DS TAB: 800-160 | 30 days supply | Qty: 30 | Fill #1 | Status: TO

## 2018-04-22 ENCOUNTER — Ambulatory Visit (INDEPENDENT_AMBULATORY_CARE_PROVIDER_SITE_OTHER): Payer: 59 | Admitting: Family

## 2018-04-22 ENCOUNTER — Encounter (HOSPITAL_COMMUNITY): Payer: Self-pay | Admitting: Emergency Medicine

## 2018-04-22 ENCOUNTER — Encounter: Payer: Self-pay | Admitting: Family

## 2018-04-22 ENCOUNTER — Ambulatory Visit (HOSPITAL_COMMUNITY)
Admission: EM | Admit: 2018-04-22 | Discharge: 2018-04-22 | Disposition: A | Discharge status: Home / Self Care | Payer: 59 | Attending: Family Medicine | Admitting: Family Medicine

## 2018-04-22 ENCOUNTER — Telehealth (HOSPITAL_COMMUNITY): Payer: Self-pay | Admitting: Emergency Medicine

## 2018-04-22 ENCOUNTER — Other Ambulatory Visit: Payer: Self-pay

## 2018-04-22 VITALS — BP 101/79 | HR 100 | Temp 98.2°F

## 2018-04-22 DIAGNOSIS — R059 Cough, unspecified: Secondary | ICD-10-CM

## 2018-04-22 DIAGNOSIS — J069 Acute upper respiratory infection, unspecified: Secondary | ICD-10-CM | POA: Diagnosis not present

## 2018-04-22 DIAGNOSIS — R05 Cough: Secondary | ICD-10-CM | POA: Diagnosis not present

## 2018-04-22 DIAGNOSIS — Z0000 Encounter for general adult medical examination without abnormal findings: Secondary | ICD-10-CM | POA: Diagnosis not present

## 2018-04-22 DIAGNOSIS — B20 Human immunodeficiency virus [HIV] disease: Secondary | ICD-10-CM | POA: Diagnosis not present

## 2018-04-22 LAB — RESPIRATORY PANEL BY PCR
Adenovirus: NOT DETECTED
BORDETELLA PERTUSSIS-RVPCR: NOT DETECTED
CHLAMYDOPHILA PNEUMONIAE-RVPPCR: NOT DETECTED
Coronavirus 229E: NOT DETECTED
Coronavirus HKU1: NOT DETECTED
Coronavirus NL63: NOT DETECTED
Coronavirus OC43: NOT DETECTED
INFLUENZA A H1 2009-RVPPR: DETECTED — AB
INFLUENZA B-RVPPCR: NOT DETECTED
MYCOPLASMA PNEUMONIAE-RVPPCR: NOT DETECTED
Metapneumovirus: NOT DETECTED
Parainfluenza Virus 1: NOT DETECTED
Parainfluenza Virus 2: NOT DETECTED
Parainfluenza Virus 3: NOT DETECTED
Parainfluenza Virus 4: NOT DETECTED
Respiratory Syncytial Virus: NOT DETECTED
Rhinovirus / Enterovirus: NOT DETECTED

## 2018-04-22 MED ORDER — OSELTAMIVIR PHOSPHATE 75 MG PO CAPS: 75.0000 mg | ORAL_CAPSULE | Freq: Two times a day (BID) | ORAL | 0 refills | Status: AC

## 2018-04-22 MED ORDER — BICTEGRAVIR-EMTRICITAB-TENOFOV 50-200-25 MG PO TABS: 1.0000 | ORAL_TABLET | Freq: Every day | ORAL | 3 refills | Status: DC

## 2018-04-22 MED ORDER — BENZONATATE 100 MG PO CAPS: 100.0000 mg | ORAL_CAPSULE | Freq: Three times a day (TID) | ORAL | 0 refills | Status: DC | PRN

## 2018-04-22 MED ORDER — AZITHROMYCIN 250 MG PO TABS: ORAL_TABLET | ORAL | 0 refills | Status: AC

## 2018-04-22 MED ORDER — SULFAMETHOXAZOLE-TRIMETHOPRIM 800-160 MG PO TABS: 1.0000 | ORAL_TABLET | Freq: Every day | ORAL | 3 refills | Status: DC

## 2018-04-22 NOTE — ED Triage Notes (Signed)
Pt here for URI sx x 2 days

## 2018-04-22 NOTE — Progress Notes (Signed)
Subjective:    Patient ID: Krystal Short, female    DOB: Jun 21, 1985, 33 y.o.   MRN: [id number]  Chief Complaint  Patient presents with  . HIV Positive/AIDS  . Cough     HPI:  Krystal Short is a 33 y.o. female who presents today for routine follow-up of HIV/AIDS.  Krystal Short was last seen in the office on 12/31/2017 for routine follow-up with good adherence and tolerance to her ART regimen of Biktarvy supplemented with Bactrim for OI prophylaxis.  CD4 count at the time was 50 with a viral load that was undetectable.  Most recent blood work completed on 04/02/2018 shows continued viral suppression and remaining undetectable with a CD4 count of 10.  Kidney function, liver function, electrolytes within normal ranges.  Hemoglobin slightly low indicating anemia.  RPR was negative for syphilis.  Krystal Short continues to take her Biktarvy and Bactrim as prescribed with no adverse side effects or missed doses.  She has been sick over the last 24 to 48 hours with cough and fever.  Her husband was sick previously starting about 4 to 5 days ago with similar symptoms.  She was seen in urgent care earlier today with respiratory panel pending.  No history of travel or concern for coronavirus.  She continues to receive her medications from Krystal Short outpatient pharmacy without complication.  Continues to work a steady job in [employer].  Housing is stable and remains in a good relationship with her husband.  Denies feelings of being down, depressed, or hopeless in the last 2 weeks.  No recreational or illicit drug use.   No Known Allergies    Outpatient Medications Prior to Visit  Medication Sig Dispense Refill  . azithromycin (ZITHROMAX) 250 MG tablet Take 2 tablets (500 mg total) by mouth daily for 1 day, THEN 1 tablet (250 mg total) daily for 4 days. 6 tablet 0  . benzonatate (TESSALON) 100 MG capsule Take 1 capsule (100 mg total) by mouth 3 (three) times daily as needed for cough. 21  capsule 0  . imiquimod (ALDARA) 5 % cream Apply 1 application topically See admin instructions. Every 3 days    . bictegravir-emtricitabine-tenofovir AF (BIKTARVY) 50-200-25 MG TABS tablet Take 1 tablet by mouth daily. 30 tablet 3  . sulfamethoxazole-trimethoprim (BACTRIM DS,SEPTRA DS) 800-160 MG tablet Take 1 tablet by mouth daily. Start at completion of other prescription. 30 tablet 3   No facility-administered medications prior to visit.      Past Medical History:  Diagnosis Date  . Acute hyponatremia 06/03/2017  . CAP (community acquired pneumonia) 06/03/2017  . Community acquired pneumonia 06/05/2017  . Facial dermatitis 06/03/2017  . HIV (human immunodeficiency virus infection) ([place])   . Pleurisy 06/03/2017  . Pneumonia 06/06/2017  . 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 ([place])   . Thrush of mouth and esophagus ([place])   . UTI (urinary tract infection)      Past Surgical History:  Procedure Laterality Date  . NO PAST SURGERIES         Review of Systems  Constitutional: Positive for fatigue. Negative for appetite change, chills, diaphoresis, fever and unexpected weight change.  HENT: Positive for congestion. Negative for sinus pressure, sinus pain and sore throat.   Eyes:       Negative for acute change in vision  Respiratory: Positive for cough. Negative for chest tightness, shortness of breath and wheezing.   Cardiovascular: Negative for chest pain.  Gastrointestinal: Negative for diarrhea, nausea  and vomiting.  Genitourinary: Negative for dysuria, pelvic pain and vaginal discharge.  Musculoskeletal: Negative for neck pain and neck stiffness.  Skin: Negative for rash.  Neurological: Negative for seizures, syncope, weakness and headaches.  Hematological: Negative for adenopathy. Does not bruise/bleed easily.  Psychiatric/Behavioral: Negative for hallucinations.      Objective:    BP 101/79   Pulse 100   Temp 98.2 F (36.8 C)  Nursing note and vital  signs reviewed.  Physical Exam Constitutional:      General: She is not in acute distress.    Appearance: She is well-developed. She is ill-appearing.  Cardiovascular:     Rate and Rhythm: Normal rate and regular rhythm.     Heart sounds: Normal heart sounds.  Pulmonary:     Effort: Pulmonary effort is normal.     Breath sounds: Normal breath sounds. No wheezing, rhonchi or rales.  Chest:     Chest wall: No tenderness.  Skin:    General: Skin is warm and dry.  Neurological:     Mental Status: She is alert and oriented to person, place, and time.        Assessment & Plan:   Problem List Items Addressed This Visit      Other   AIDS ([place]) - Primary    Krystal Short has adequately controlled HIV/AIDS with her current ART regimen of Biktarvy supplemented with Bactrim for OI prophylaxis.  Viral load remains suppressed and undetectable with CD4 count of 10.  No signs/symptoms of opportunistic infection or progressive HIV disease at present.  Symptoms unlikely related to opportunistic infection.  Continue current dose of Biktarvy supplemented with Bactrim for OI prophylaxis.  Plan for follow-up in 3 months or sooner if needed with blood work 1 to 2 weeks prior to appointment.      Relevant Medications   bictegravir-emtricitabine-tenofovir AF (BIKTARVY) 50-200-25 MG TABS tablet   sulfamethoxazole-trimethoprim (BACTRIM DS,SEPTRA DS) 800-160 MG tablet   Other Relevant Orders   T-helper cell (CD4)- ([facility] clinic only)   HIV-1 RNA quant-no reflex-bld   CBC   Comprehensive metabolic panel   Cough    New onset influenza-like illness within the last 24 to 48 hours with fever and cough.  Likely a viral infection.  No history of travel or concern for coronavirus.  Does not appear to be related to opportunistic infection.  Continue to monitor respiratory panel obtained by urgent care.  Recommend symptom relief and supportive care as needed.  Follow-up if symptoms worsen or do not improve.       Healthcare maintenance     Illness vaccinations held today due to illness.  Discussed importance of safe sexual practice to reduce risk of acquisition and transmission of STI.  Declines condoms.  Remains in a monogamous relationship.          I am having Dayzha Cayer maintain her imiquimod, azithromycin, benzonatate, bictegravir-emtricitabine-tenofovir AF, and sulfamethoxazole-trimethoprim.   Meds ordered this encounter  Medications  . bictegravir-emtricitabine-tenofovir AF (BIKTARVY) 50-200-25 MG TABS tablet    Sig: Take 1 tablet by mouth daily.    Dispense:  30 tablet    Refill:  3    Order Specific Question:   Supervising Provider    Answer:   Carlyle Basques [[id number]]  . sulfamethoxazole-trimethoprim (BACTRIM DS,SEPTRA DS) 800-160 MG tablet    Sig: Take 1 tablet by mouth daily. Start at completion of other prescription.    Dispense:  30 tablet    Refill:  3  Order Specific Question:   Supervising Provider    Answer:   Carlyle Basques [[id number]]     Follow-up: Return in about 3 months (around 07/23/2018), or if symptoms worsen or fail to improve.   Terri Piedra, MSN, FNP-C Nurse Practitioner [facility] Office phone: [phone] Pager: [place] number: [phone]

## 2018-04-22 NOTE — Assessment & Plan Note (Signed)
   Illness vaccinations held today due to illness.  Discussed importance of safe sexual practice to reduce risk of acquisition and transmission of STI.  Declines condoms.  Remains in a monogamous relationship.

## 2018-04-22 NOTE — Assessment & Plan Note (Signed)
New onset influenza-like illness within the last 24 to 48 hours with fever and cough.  Likely a viral infection.  No history of travel or concern for coronavirus.  Does not appear to be related to opportunistic infection.  Continue to monitor respiratory panel obtained by urgent care.  Recommend symptom relief and supportive care as needed.  Follow-up if symptoms worsen or do not improve.

## 2018-04-22 NOTE — Patient Instructions (Signed)
Nice to see you.  Hope that you get to feeling better.  I will watch your respiratory panel.   Keep taking your Biktarvy as prescribed along with Bactrim.   Plan for follow up in 3 months or sooner if needed with lab work 1-2 weeks prior to office visit.

## 2018-04-22 NOTE — ED Provider Notes (Signed)
[facility]    CSN: [id number] Arrival date & time: 04/22/18  1116     History   Chief Complaint Chief Complaint  Patient presents with  . URI    HPI Krystal Short is a 33 y.o. female.   [name] presents with her S/O with complaints of cough. Started yesterday and worse today. Causes sore throat and chest pain. No shortness of breath . Her S/O was ill first and is feeling improved. Last night had chills and nausea as well as ear pain. This has improved. No further nausea. No diarrhea today, did have some yesterday. No abdominal pain. No new rash. No recent travel. No known exposure to novel Covid-19. Hx of hospitalization due to PNA, last hospitalization was positive for rhinovirus. No congestion. Hx of CAP, HIV, pleurisy, uti, tachycardia.     ROS per HPI, negative if not otherwise mentioned.      Past Medical History:  Diagnosis Date  . Acute hyponatremia 06/03/2017  . CAP (community acquired pneumonia) 06/03/2017  . Community acquired pneumonia 06/05/2017  . Facial dermatitis 06/03/2017  . HIV (human immunodeficiency virus infection) ([place])   . Pleurisy 06/03/2017  . Pneumonia 06/06/2017  . 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 ([place])   . Thrush of mouth and esophagus ([place])   . UTI (urinary tract infection)     Patient Active Problem List   Diagnosis Date Noted  . Acute respiratory failure with hypoxia ([place]) 03/08/2018  . CAP (community acquired pneumonia) 03/08/2018  . Healthcare maintenance 12/31/2017  . Cough 08/15/2017  . Molluscum contagiosum 06/27/2017  . AIDS ([place])   . Tachycardia 06/03/2017  . Tobacco abuse 06/03/2017    Past Surgical History:  Procedure Laterality Date  . NO PAST SURGERIES      OB History    Gravida  0   Para  0   Term  0   Preterm  0   AB  0   Living  0     SAB  0   TAB  0   Ectopic  0   Multiple  0   Live Births  0            Home Medications    Prior to Admission medications    Medication Sig Start Date End Date Taking? Authorizing Provider  azithromycin (ZITHROMAX) 250 MG tablet Take 2 tablets (500 mg total) by mouth daily for 1 day, THEN 1 tablet (250 mg total) daily for 4 days. 04/22/18 04/27/18  Zigmund Gottron, NP  benzonatate (TESSALON) 100 MG capsule Take 1 capsule (100 mg total) by mouth 3 (three) times daily as needed for cough. 04/22/18   Zigmund Gottron, NP  bictegravir-emtricitabine-tenofovir AF (BIKTARVY) 50-200-25 MG TABS tablet Take 1 tablet by mouth daily. 12/31/17   Golden Circle, FNP  imiquimod (ALDARA) 5 % cream Apply 1 application topically See admin instructions. Every 3 days 03/05/18   [provider]  sulfamethoxazole-trimethoprim (BACTRIM DS,SEPTRA DS) 800-160 MG tablet Take 1 tablet by mouth daily. Start at completion of other prescription. 12/31/17   Golden Circle, FNP    Family History Family History  Problem Relation Age of Onset  . Healthy Father   . Healthy Mother     Social History Social History   Tobacco Use  . Smoking status: Former Smoker    Types: Cigarettes    Last attempt to quit: 12/25/2016    Years since quitting: 1.3  . Smokeless tobacco: Never Used  Substance Use Topics  . Alcohol use: Yes    Comment: weekend/socially  . Drug use: No     Allergies   Patient has no known allergies.   Review of Systems Review of Systems   Physical Exam Triage Vital Signs ED Triage Vitals [04/22/18 1158]  Enc Vitals Group     BP 132/71     Pulse Rate (!) 108     Resp 18     Temp (!) 97.4 F (36.3 C)     Temp Source Temporal     SpO2 100 %     Weight      Height      Head Circumference      Peak Flow      Pain Score 5     Pain Loc      Pain Edu?      Excl. in [place]?    No data found.  Updated Vital Signs BP 132/71 (BP Location: Right Arm)   Pulse (!) 108   Temp (!) 97.4 F (36.3 C) (Temporal)   Resp 18   SpO2 100%    Physical Exam Constitutional:      General: She is not in acute  distress.    Appearance: She is well-developed.  HENT:     Head: Normocephalic and atraumatic.     Right Ear: Tympanic membrane, ear canal and external ear normal.     Left Ear: Tympanic membrane, ear canal and external ear normal.     Nose: Nose normal.     Mouth/Throat:     Pharynx: Uvula midline.     Tonsils: No tonsillar exudate.  Eyes:     Conjunctiva/sclera: Conjunctivae normal.     Pupils: Pupils are equal, round, and reactive to light.  Cardiovascular:     Rate and Rhythm: Regular rhythm. Tachycardia present.     Heart sounds: Normal heart sounds.  Pulmonary:     Effort: Pulmonary effort is normal.     Breath sounds: Normal breath sounds.     Comments: Strong dry cough noted intermittently; no increased work of breathing, lungs clear  Skin:    General: Skin is warm and dry.  Neurological:     Mental Status: She is alert and oriented to person, place, and time.      UC Treatments / Results  Labs (all labs ordered are listed, but only abnormal results are displayed) Labs Reviewed  RESPIRATORY PANEL BY PCR  POCT INFLUENZA A/B    EKG None  Radiology No results found.  Procedures Procedures (including critical care time)  Medications Ordered in UC Medications - No data to display  Initial Impression / Assessment and Plan / UC Course  I have reviewed the triage vital signs and the nursing notes.  Pertinent labs & imaging results that were available during my care of the patient were reviewed by me and considered in my medical decision making (see chart for details).     Non toxic in appearance, mild tachycardia which patient tends to have in the past as well. Imaging deferred today as symptoms started yesterday as well as clear lungs at this time. No increased work of breathing, tachypnea or hypoxia. Unable to complete a rapid influenza here today but viral respiratory panel collected and pending at this time, as suspect this is viral in nature. With hx of HIV  will cover for atypicals with azithromycin as well. Strict return precautions. Patient verbalized understanding and agreeable to plan.    Final Clinical  Impressions(s) / UC Diagnoses   Final diagnoses:  Upper respiratory tract infection, unspecified type     Discharge Instructions     Complete course of antibiotics.  Tessalon as needed for cough.  Will notify you of any positive findings from your viral panel and if any changes to treatment are needed.   If worsening of symptoms, worse cough, shortness of breath, chills or fever, please go to the ER.     ED Prescriptions    Medication Sig Dispense Auth. Provider   azithromycin (ZITHROMAX) 250 MG tablet Take 2 tablets (500 mg total) by mouth daily for 1 day, THEN 1 tablet (250 mg total) daily for 4 days. 6 tablet Augusto Gamble B, NP   benzonatate (TESSALON) 100 MG capsule Take 1 capsule (100 mg total) by mouth 3 (three) times daily as needed for cough. 21 capsule Zigmund Gottron, NP     Controlled Substance Prescriptions [place] Controlled Substance Registry consulted? Not Applicable   Zigmund Gottron, NP 04/22/18 1310

## 2018-04-22 NOTE — Discharge Instructions (Signed)
Complete course of antibiotics.  Tessalon as needed for cough.  Will notify you of any positive findings from your viral panel and if any changes to treatment are needed.   If worsening of symptoms, worse cough, shortness of breath, chills or fever, please go to the ER.

## 2018-04-22 NOTE — Assessment & Plan Note (Signed)
Krystal Short has adequately controlled HIV/AIDS with her current ART regimen of Biktarvy supplemented with Bactrim for OI prophylaxis.  Viral load remains suppressed and undetectable with CD4 count of 10.  No signs/symptoms of opportunistic infection or progressive HIV disease at present.  Symptoms unlikely related to opportunistic infection.  Continue current dose of Biktarvy supplemented with Bactrim for OI prophylaxis.  Plan for follow-up in 3 months or sooner if needed with blood work 1 to 2 weeks prior to appointment.

## 2018-04-23 ENCOUNTER — Telehealth (HOSPITAL_COMMUNITY): Payer: Self-pay | Admitting: Emergency Medicine

## 2018-04-23 NOTE — Telephone Encounter (Signed)
Positive influenza on viral panel. tamiflu provided due to high risk patient.    Zigmund Gottron, NP 04/22/2018 2:25 PM

## 2018-04-23 NOTE — Telephone Encounter (Signed)
Patient contacted and made aware of all results, all questions answered.   

## 2018-04-25 ENCOUNTER — Ambulatory Visit: Payer: 59 | Admitting: Obstetrics & Gynecology

## 2018-05-10 ENCOUNTER — Ambulatory Visit (INDEPENDENT_AMBULATORY_CARE_PROVIDER_SITE_OTHER): Payer: 59 | Admitting: Obstetrics & Gynecology

## 2018-05-10 ENCOUNTER — Encounter: Payer: Self-pay | Admitting: Obstetrics & Gynecology

## 2018-05-10 ENCOUNTER — Other Ambulatory Visit: Payer: Self-pay

## 2018-05-10 ENCOUNTER — Other Ambulatory Visit (HOSPITAL_COMMUNITY)
Admission: RE | Admit: 2018-05-10 | Discharge: 2018-05-10 | Disposition: A | Discharge status: Home / Self Care | Payer: 59 | Source: Ambulatory Visit | Attending: Obstetrics & Gynecology | Admitting: Obstetrics & Gynecology

## 2018-05-10 VITALS — BP 112/78 | HR 71 | Temp 97.8°F | Ht 61.5 in | Wt 125.0 lb

## 2018-05-10 DIAGNOSIS — Z3202 Encounter for pregnancy test, result negative: Secondary | ICD-10-CM

## 2018-05-10 DIAGNOSIS — D060 Carcinoma in situ of endocervix: Secondary | ICD-10-CM | POA: Diagnosis not present

## 2018-05-10 DIAGNOSIS — N871 Moderate cervical dysplasia: Secondary | ICD-10-CM | POA: Diagnosis not present

## 2018-05-10 DIAGNOSIS — B20 Human immunodeficiency virus [HIV] disease: Secondary | ICD-10-CM

## 2018-05-10 DIAGNOSIS — Z21 Asymptomatic human immunodeficiency virus [HIV] infection status: Secondary | ICD-10-CM | POA: Diagnosis not present

## 2018-05-10 LAB — LAB REPORT - SCANNED: Preg Test, Ur: NEGATIVE

## 2018-05-10 NOTE — Progress Notes (Signed)
Unable to scan results into Epic due to meter issue. Entered urine pregnancy test done in office today= negative.  Linda,RN

## 2018-05-10 NOTE — Progress Notes (Signed)
   Subjective:    Patient ID: Krystal Short, female    DOB: November 09, 1985, 33 y.o.   MRN: [id number]  HPI 33 yo married P0 here for a LEEP due to CIN3 on ECC and CIN2 on cervical biopsy after a HGSIL pap last year. She has HIV. She uses depo provera for contraception and is up to date with her injections.  Review of Systems     Objective:   Physical Exam Breathing, conversing, and ambulating normally Well nourished, well hydrated Black female, no apparent distress  Colpo Biopsy:   Risks, benefits, alternatives, and limitations of procedure explained to patient, including pain, bleeding, infection, failure to remove abnormal tissue and failure to cure dysplasia, need for repeat procedures, damage to pelvic organs, cervical incompetence.  Role of HPV,cervical dysplasia and need for close followup was empasized. Informed written consent was obtained. All questions were answered. Time out performed. Urine pregnancy test was negative.  Procedure: The patient was placed in lithotomy position and the bivalved coated speculum was placed in the patient's vagina. A grounding pad placed on the patient. Acetic acid was applied to the cervix and areas of decreased uptake were noted around the transformation zone, viewed through the colposcope.  Local anesthesia was administered via an intracervical block using 10 cc of 2% Lidocaine with epinephrine. The suction was turned on and the medium 1X Fisher Cone Biopsy Excisor on 92 Watts of cutting current was used to excise the entire transformation zone and any areas of visible dysplasia. I obtained an ECC.  Excellent hemostasis was achieved using roller ball coagulation set at 50 Watts coagulation current. The speculum was removed from the vagina. Specimens were sent to pathology.  The patient tolerated the procedure well. Post-operative instructions given to patient, including instruction to seek medical attention for persistent bright red bleeding, fever,  abdominal/pelvic pain, dysuria, nausea or vomiting. She was also told about the possibility of having copious yellow to black tinged discharge for weeks. She was counseled to avoid anything in the vagina (sex/douching/tampons) for 3 weeks. She has a 4 week post-operative check to assess wound healing, review results and discuss further management.       Assessment & Plan:  CIN 2&3 now s/p LEEP- await pathology I will send her a Mychart message about the results.

## 2018-05-13 MED FILL — SULFAMETHOXAZOLE-TMP DS TAB: 800-160 | 30 days supply | Qty: 30 | Fill #0

## 2018-05-13 MED FILL — BIKTARVY 50-200-25 MG TABS: 50-200-25 | 30 days supply | Qty: 30 | Fill #0

## 2018-05-24 ENCOUNTER — Ambulatory Visit (HOSPITAL_COMMUNITY)
Admission: EM | Admit: 2018-05-24 | Discharge: 2018-05-24 | Disposition: A | Discharge status: Home / Self Care | Payer: 59 | Attending: Family Medicine | Admitting: Family Medicine

## 2018-05-24 ENCOUNTER — Encounter (HOSPITAL_COMMUNITY): Payer: Self-pay

## 2018-05-24 DIAGNOSIS — E860 Dehydration: Secondary | ICD-10-CM | POA: Insufficient documentation

## 2018-05-24 DIAGNOSIS — E861 Hypovolemia: Secondary | ICD-10-CM | POA: Diagnosis not present

## 2018-05-24 DIAGNOSIS — B349 Viral infection, unspecified: Secondary | ICD-10-CM | POA: Diagnosis not present

## 2018-05-24 DIAGNOSIS — R112 Nausea with vomiting, unspecified: Secondary | ICD-10-CM

## 2018-05-24 DIAGNOSIS — E871 Hypo-osmolality and hyponatremia: Secondary | ICD-10-CM | POA: Diagnosis not present

## 2018-05-24 DIAGNOSIS — I9589 Other hypotension: Secondary | ICD-10-CM | POA: Diagnosis not present

## 2018-05-24 LAB — BASIC METABOLIC PANEL
Anion gap: 9 (ref 5–15)
BUN: 13 mg/dL (ref 6–20)
CO2: 18 mmol/L — ABNORMAL LOW (ref 22–32)
Calcium: 8.6 mg/dL — ABNORMAL LOW (ref 8.9–10.3)
Chloride: 100 mmol/L (ref 98–111)
Creatinine, Ser: 1.1 mg/dL — ABNORMAL HIGH (ref 0.44–1.00)
GFR calc Af Amer: >60 mL/min (ref >60)
GFR calc non Af Amer: >60 mL/min (ref >60)
Glucose, Bld: 111 mg/dL — ABNORMAL HIGH (ref 70–99)
Potassium: 5.1 mmol/L (ref 3.5–5.1)
Sodium: 127 mmol/L — ABNORMAL LOW (ref 135–145)

## 2018-05-24 LAB — CBC
HCT: 32.8 % — ABNORMAL LOW (ref 36.0–46.0)
Hemoglobin: 11.7 g/dL — ABNORMAL LOW (ref 12.0–15.0)
MCH: 34.7 pg — ABNORMAL HIGH (ref 26.0–34.0)
MCHC: 35.7 g/dL (ref 30.0–36.0)
MCV: 97.3 fL (ref 80.0–100.0)
Platelets: 75 10*3/uL — ABNORMAL LOW (ref 150–400)
RBC: 3.37 MIL/uL — ABNORMAL LOW (ref 3.87–5.11)
RDW: 14.2 % (ref 11.5–15.5)
WBC: 10.5 10*3/uL (ref 4.0–10.5)
nRBC: 0 % (ref 0.0–0.2)

## 2018-05-24 MED ORDER — SODIUM CHLORIDE 0.9 % IV SOLN
Freq: Once | INTRAVENOUS | Status: AC
  Administered 2018-05-24: 10:00:00 via INTRAVENOUS

## 2018-05-24 MED ORDER — ONDANSETRON HCL 4 MG PO TABS: 4.0000 mg | ORAL_TABLET | Freq: Four times a day (QID) | ORAL | 0 refills | Status: DC

## 2018-05-24 MED ORDER — SODIUM CHLORIDE 0.9 % IV BOLUS
1000.0000 mL | Freq: Once | INTRAVENOUS | Status: AC
  Administered 2018-05-24: 1000 mL via INTRAVENOUS

## 2018-05-24 MED ORDER — ONDANSETRON HCL 4 MG/2ML IJ SOLN
INTRAMUSCULAR | Status: AC
  Filled 2018-05-24: qty 2

## 2018-05-24 MED ORDER — ONDANSETRON HCL 4 MG/2ML IJ SOLN
4.0000 mg | Freq: Once | INTRAMUSCULAR | Status: AC
  Administered 2018-05-24: 10:00:00 4 mg via INTRAMUSCULAR

## 2018-05-24 NOTE — ED Triage Notes (Signed)
Pt states [facility] d/t low b/p; states vomit x2 this am

## 2018-05-24 NOTE — Discharge Instructions (Addendum)
I am giving you a prescription for Zofran.  This is for nausea and vomiting.  Continue to take it as needed You need to drink plenty of fluids Bland diet as tolerated.  Add more salt to diet You have a viral illness.  It is impossible to tell whether it is influenza, COVID-19, or another virus.  It is treated by rest, fluids, Tylenol, Robitussin-DM.  If you get worse instead of better, or more short of breath, presented to the ER for follow-up.  You should call your family doctor when you get home today and set up an appointment for early next week to determine if you are ready to return to work.  Continue to take your temperature a couple times a day, and write them down.  You will not be able to return to work until you have been without fever for 3 days

## 2018-05-24 NOTE — ED Triage Notes (Signed)
Pt c/o fever, cough, and nausea x3 days, last tylenol at 12am

## 2018-05-24 NOTE — ED Notes (Signed)
Pt states has her own tylenol with her

## 2018-05-24 NOTE — ED Provider Notes (Signed)
[facility]    CSN: [id number] Arrival date & time: 05/24/18  0849     History   Chief Complaint Chief Complaint  Patient presents with  . Fever    HPI Krystal Short is a 33 y.o. female.   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 Has been careful about handwashing and wearing a mask to prevent COVID-19 Is HIV positive and under the care of infectious disease Is compliant with care Was positive for influenza A when seen last month 04/22/2018, took course of tamiflu Old notes and labs reviewed Hospitalized also in Jan for respiratory illness, and was hypotensive and tachycardic then Here today because she tells me she has been sick for a couple of weeks.  Not feeling well.  Fevers that come and go.  Slight cough.  No real shortness of breath.  Chest pain or congestion.  Does not feel like pneumonia of previous hospitalization.  She states that yesterday she had a decreased appetite but trying to drink enough fluids.  Today she has thrown up everything she is eaten.  No abdominal pain.  No diarrhea.  No headache.  She feels weak but not specifically lightheaded or orthostatic.  He went to an [facility] for evaluation.  They sent her to the emergency room for hypotension.  She came here because she was hoping to avoid the high ER co-pay.  She is also hoping to avoid hospitalization   Past Medical History:  Diagnosis Date  . Acute hyponatremia 06/03/2017  . CAP (community acquired pneumonia) 06/03/2017  . Community acquired pneumonia 06/05/2017  . Facial dermatitis 06/03/2017  . HIV (human immunodeficiency virus infection) ([place])   . Pleurisy 06/03/2017  . Pneumonia 06/06/2017  . 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 ([place])   . Thrush of mouth and esophagus ([place])   . UTI (urinary tract infection)     Patient Active Problem List   Diagnosis Date Noted  . Acute respiratory failure with hypoxia ([place]) 03/08/2018  . CAP  (community acquired pneumonia) 03/08/2018  . Healthcare maintenance 12/31/2017  . Cough 08/15/2017  . Molluscum contagiosum 06/27/2017  . AIDS ([place])   . Tachycardia 06/03/2017  . Tobacco abuse 06/03/2017    Past Surgical History:  Procedure Laterality Date  . NO PAST SURGERIES      OB History    Gravida  0   Para  0   Term  0   Preterm  0   AB  0   Living  0     SAB  0   TAB  0   Ectopic  0   Multiple  0   Live Births  0            Home Medications    Prior to Admission medications   Medication Sig Start Date End Date Taking? Authorizing Provider  sulfamethoxazole-trimethoprim (BACTRIM DS) 800-160 MG tablet Take 1 tablet by mouth daily.   Yes [provider]  bictegravir-emtricitabine-tenofovir AF (BIKTARVY) 50-200-25 MG TABS tablet Take 1 tablet by mouth daily. 04/22/18   Golden Circle, FNP  imiquimod (ALDARA) 5 % cream Apply 1 application topically See admin instructions. Every 3 days 03/05/18   [provider]  ondansetron (ZOFRAN) 4 MG tablet Take 1 tablet (4 mg total) by mouth every 6 (six) hours. 05/24/18   Raylene Everts, MD    Family History Family History  Problem Relation Age of Onset  . Healthy Father   .  Healthy Mother     Social History Social History   Tobacco Use  . Smoking status: Former Smoker    Types: Cigarettes    Last attempt to quit: 12/25/2016    Years since quitting: 1.4  . Smokeless tobacco: Never Used  Substance Use Topics  . Alcohol use: Yes    Comment: weekend/socially  . Drug use: No     Allergies   Patient has no known allergies.   Review of Systems Review of Systems  Constitutional: Positive for activity change, appetite change, chills, fatigue and fever.  HENT: Negative for ear pain and sore throat.   Eyes: Negative for pain and visual disturbance.  Respiratory: Positive for cough. Negative for shortness of breath.   Cardiovascular: Negative for chest pain and palpitations.   Gastrointestinal: Positive for nausea and vomiting. Negative for abdominal pain.  Genitourinary: Negative for dysuria and hematuria.  Musculoskeletal: Negative for arthralgias and back pain.  Skin: Negative for color change and rash.  Neurological: Negative for seizures, syncope and light-headedness.  Psychiatric/Behavioral: Negative for confusion.  All other systems reviewed and are negative.    Physical Exam Triage Vital Signs ED Triage Vitals [05/24/18 0904]  Enc Vitals Group     BP (!) 83/63     Pulse Rate (!) 130     Resp 20     Temp 98.4 F (36.9 C)     Temp Source Oral     SpO2 100 %     Weight      Height      Head Circumference      Peak Flow      Pain Score 7     Pain Loc      Pain Edu?      Excl. in [place]?    No data found.  Updated Vital Signs BP 103/69 (BP Location: Left Arm)   Pulse (!) 119   Temp (!) 101.3 F (38.5 C) (Oral)   Resp 18   LMP 05/07/2018 (Within Days) Comment: spotting  SpO2 100%      Physical Exam Constitutional:      General: She is not in acute distress.    Appearance: She is well-developed and normal weight. She is ill-appearing.  HENT:     Head: Normocephalic and atraumatic.     Right Ear: Tympanic membrane and ear canal normal.     Left Ear: Tympanic membrane and ear canal normal.     Nose: Nose normal. No congestion.     Mouth/Throat:     Mouth: Mucous membranes are dry.  Eyes:     Conjunctiva/sclera: Conjunctivae normal.     Pupils: Pupils are equal, round, and reactive to light.  Neck:     Musculoskeletal: Normal range of motion.  Cardiovascular:     Rate and Rhythm: Normal rate and regular rhythm.     Heart sounds: Normal heart sounds.  Pulmonary:     Effort: Pulmonary effort is normal. No respiratory distress.     Breath sounds: Normal breath sounds.     Comments: Lungs are clear Abdominal:     General: Abdomen is flat. There is no distension.     Palpations: Abdomen is soft.     Tenderness: There is no  abdominal tenderness.  Musculoskeletal: Normal range of motion.        General: No swelling or tenderness.  Lymphadenopathy:     Cervical: No cervical adenopathy.  Skin:    General: Skin is warm.  Comments: Skin warm and damp  Neurological:     General: No focal deficit present.     Mental Status: She is alert.  Psychiatric:        Mood and Affect: Mood normal.        Behavior: Behavior normal.      UC Treatments / Results  Labs (all labs ordered are listed, but only abnormal results are displayed) Labs Reviewed  BASIC METABOLIC PANEL - Abnormal; Notable for the following components:      Result Value   Sodium 127 (*)    CO2 18 (*)    Glucose, Bld 111 (*)    Creatinine, Ser 1.10 (*)    Calcium 8.6 (*)    All other components within normal limits  CBC - Abnormal; Notable for the following components:   RBC 3.37 (*)    Hemoglobin 11.7 (*)    HCT 32.8 (*)    MCH 34.7 (*)    Platelets 75 (*)    All other components within normal limits    EKG None  Radiology No results found.  Procedures Procedures -gave patient 1 L of IV be fluids which improved her blood pressure and pulse somewhat.  Because she was still feeling weak she received a second liter of fluids.  After that she did feel well enough to try to go home.  She is given Zofran.  We discussed her test results.  We discussed the importance of going to the emergency room promptly if worse instead of better at any time.  Medications Ordered in UC Medications  ondansetron (ZOFRAN) injection 4 mg (4 mg Intramuscular Given 05/24/18 0940)  0.9 %  sodium chloride infusion ( Intravenous Stopped 05/24/18 1022)  sodium chloride 0.9 % bolus 1,000 mL (0 mLs Intravenous Stopped 05/24/18 1121)    Initial Impression / Assessment and Plan / UC Course  I have reviewed the triage vital signs and the nursing notes.  Pertinent labs & imaging results that were available during my care of the patient were reviewed by me and  considered in my medical decision making (see chart for details).     Mr. Terri Piedra, FNP, in the department infectious diseases sent a memo regarding her visit Final Clinical Impressions(s) / UC Diagnoses   Final diagnoses:  Viral illness  Dehydration  Hyponatremia  Hypotension due to hypovolemia     Discharge Instructions     I am giving you a prescription for Zofran.  This is for nausea and vomiting.  Continue to take it as needed You need to drink plenty of fluids Bland diet as tolerated.  Add more salt to diet You have a viral illness.  It is impossible to tell whether it is influenza, COVID-19, or another virus.  It is treated by rest, fluids, Tylenol, Robitussin-DM.  If you get worse instead of better, or more short of breath, presented to the ER for follow-up.  You should call your family doctor when you get home today and set up an appointment for early next week to determine if you are ready to return to work.  Continue to take your temperature a couple times a day, and write them down.  You will not be able to return to work until you have been without fever for 3 days    ED Prescriptions    Medication Sig Dispense Auth. Provider   ondansetron (ZOFRAN) 4 MG tablet Take 1 tablet (4 mg total) by mouth every 6 (six) hours. 12 tablet  Raylene Everts, MD     Controlled Substance Prescriptions [place] Controlled Substance Registry consulted? Not Applicable   Raylene Everts, MD 05/24/18 1157

## 2018-05-28 ENCOUNTER — Ambulatory Visit (INDEPENDENT_AMBULATORY_CARE_PROVIDER_SITE_OTHER): Payer: 59 | Admitting: Family

## 2018-05-28 ENCOUNTER — Other Ambulatory Visit: Payer: Self-pay

## 2018-05-28 ENCOUNTER — Encounter (HOSPITAL_COMMUNITY): Payer: Self-pay

## 2018-05-28 ENCOUNTER — Inpatient Hospital Stay (HOSPITAL_COMMUNITY)
Admission: EM | Admit: 2018-05-28 | Discharge: 2018-05-31 | DRG: 975 | Disposition: A | Discharge status: Home / Self Care | Payer: 59 | Attending: Internal Medicine | Admitting: Internal Medicine

## 2018-05-28 ENCOUNTER — Inpatient Hospital Stay (HOSPITAL_COMMUNITY): Payer: 59

## 2018-05-28 ENCOUNTER — Encounter: Payer: Self-pay | Admitting: Family

## 2018-05-28 ENCOUNTER — Emergency Department (HOSPITAL_COMMUNITY): Payer: 59

## 2018-05-28 DIAGNOSIS — D696 Thrombocytopenia, unspecified: Secondary | ICD-10-CM | POA: Diagnosis present

## 2018-05-28 DIAGNOSIS — R0902 Hypoxemia: Secondary | ICD-10-CM | POA: Diagnosis present

## 2018-05-28 DIAGNOSIS — Z20828 Contact with and (suspected) exposure to other viral communicable diseases: Secondary | ICD-10-CM

## 2018-05-28 DIAGNOSIS — Z87891 Personal history of nicotine dependence: Secondary | ICD-10-CM

## 2018-05-28 DIAGNOSIS — E871 Hypo-osmolality and hyponatremia: Secondary | ICD-10-CM | POA: Diagnosis not present

## 2018-05-28 DIAGNOSIS — D649 Anemia, unspecified: Secondary | ICD-10-CM | POA: Diagnosis not present

## 2018-05-28 DIAGNOSIS — R509 Fever, unspecified: Secondary | ICD-10-CM | POA: Diagnosis present

## 2018-05-28 DIAGNOSIS — R6510 Systemic inflammatory response syndrome (SIRS) of non-infectious origin without acute organ dysfunction: Secondary | ICD-10-CM

## 2018-05-28 DIAGNOSIS — R0602 Shortness of breath: Secondary | ICD-10-CM

## 2018-05-28 DIAGNOSIS — A419 Sepsis, unspecified organism: Secondary | ICD-10-CM | POA: Diagnosis not present

## 2018-05-28 DIAGNOSIS — R05 Cough: Secondary | ICD-10-CM | POA: Diagnosis present

## 2018-05-28 DIAGNOSIS — B20 Human immunodeficiency virus [HIV] disease: Principal | ICD-10-CM | POA: Diagnosis present

## 2018-05-28 DIAGNOSIS — Z79899 Other long term (current) drug therapy: Secondary | ICD-10-CM

## 2018-05-28 DIAGNOSIS — R0600 Dyspnea, unspecified: Secondary | ICD-10-CM

## 2018-05-28 DIAGNOSIS — Z21 Asymptomatic human immunodeficiency virus [HIV] infection status: Secondary | ICD-10-CM | POA: Diagnosis not present

## 2018-05-28 DIAGNOSIS — R918 Other nonspecific abnormal finding of lung field: Secondary | ICD-10-CM | POA: Diagnosis not present

## 2018-05-28 DIAGNOSIS — R590 Localized enlarged lymph nodes: Secondary | ICD-10-CM | POA: Diagnosis not present

## 2018-05-28 DIAGNOSIS — R0609 Other forms of dyspnea: Secondary | ICD-10-CM | POA: Insufficient documentation

## 2018-05-28 DIAGNOSIS — R599 Enlarged lymph nodes, unspecified: Secondary | ICD-10-CM | POA: Diagnosis present

## 2018-05-28 DIAGNOSIS — B59 Pneumocystosis: Secondary | ICD-10-CM | POA: Diagnosis present

## 2018-05-28 DIAGNOSIS — J189 Pneumonia, unspecified organism: Secondary | ICD-10-CM | POA: Diagnosis present

## 2018-05-28 LAB — BLOOD GAS, VENOUS
Acid-base deficit: 7.1 mmol/L — ABNORMAL HIGH (ref 0.0–2.0)
Bicarbonate: 16.9 mmol/L — ABNORMAL LOW (ref 20.0–28.0)
O2 Saturation: 72.7 %
Patient temperature: 98.6
pCO2, Ven: 29.5 mmHg — ABNORMAL LOW (ref 44.0–60.0)
pH, Ven: 7.377 (ref 7.250–7.430)
pO2, Ven: 44.2 mmHg (ref 32.0–45.0)

## 2018-05-28 LAB — RESPIRATORY PANEL BY PCR

## 2018-05-28 LAB — COMPREHENSIVE METABOLIC PANEL
ALT: 31 U/L (ref 0–44)
AST: 44 U/L — ABNORMAL HIGH (ref 15–41)
Albumin: 2.4 g/dL — ABNORMAL LOW (ref 3.5–5.0)
Alkaline Phosphatase: 180 U/L — ABNORMAL HIGH (ref 38–126)
Anion gap: 12 (ref 5–15)
BUN: 16 mg/dL (ref 6–20)
CO2: 16 mmol/L — ABNORMAL LOW (ref 22–32)
Calcium: 8.2 mg/dL — ABNORMAL LOW (ref 8.9–10.3)
Chloride: 104 mmol/L (ref 98–111)
Creatinine, Ser: 1.02 mg/dL — ABNORMAL HIGH (ref 0.44–1.00)
GFR calc Af Amer: >60 mL/min (ref >60)
GFR calc non Af Amer: >60 mL/min (ref >60)
Glucose, Bld: 91 mg/dL (ref 70–99)
Potassium: 4 mmol/L (ref 3.5–5.1)
Sodium: 132 mmol/L — ABNORMAL LOW (ref 135–145)
Total Bilirubin: 1.2 mg/dL (ref 0.3–1.2)
Total Protein: 8.1 g/dL (ref 6.5–8.1)

## 2018-05-28 LAB — CBC WITH DIFFERENTIAL/PLATELET
Band Neutrophils: 7 %
Basophils Absolute: 0 10*3/uL (ref 0.0–0.1)
Basophils Relative: 0 %
Blasts: 0 %
Eosinophils Absolute: 0 10*3/uL (ref 0.0–0.5)
Eosinophils Relative: 0 %
HCT: 25.4 % — ABNORMAL LOW (ref 36.0–46.0)
Hemoglobin: 8.6 g/dL — ABNORMAL LOW (ref 12.0–15.0)
Lymphocytes Relative: 3 %
Lymphs Abs: 0.3 10*3/uL — ABNORMAL LOW (ref 0.7–4.0)
MCH: 33.7 pg (ref 26.0–34.0)
MCHC: 33.9 g/dL (ref 30.0–36.0)
MCV: 99.6 fL (ref 80.0–100.0)
Metamyelocytes Relative: 1 %
Monocytes Absolute: 1 10*3/uL (ref 0.1–1.0)
Monocytes Relative: 10 %
Myelocytes: 1 %
Neutro Abs: 8.7 10*3/uL — ABNORMAL HIGH (ref 1.7–7.7)
Neutrophils Relative %: 78 %
Other: 0 %
Platelets: 68 10*3/uL — ABNORMAL LOW (ref 150–400)
Promyelocytes Relative: 0 %
RBC: 2.55 MIL/uL — ABNORMAL LOW (ref 3.87–5.11)
RDW: 15.1 % (ref 11.5–15.5)
WBC: 10 10*3/uL (ref 4.0–10.5)
nRBC: 0 % (ref 0.0–0.2)
nRBC: 0 /100 WBC

## 2018-05-28 LAB — PHOSPHORUS: Phosphorus: 3 mg/dL (ref 2.5–4.6)

## 2018-05-28 LAB — C-REACTIVE PROTEIN: CRP: 31 mg/dL — ABNORMAL HIGH (ref <1.0)

## 2018-05-28 LAB — MRSA PCR SCREENING: MRSA by PCR: NEGATIVE

## 2018-05-28 LAB — LACTATE DEHYDROGENASE: LDH: 566 U/L — ABNORMAL HIGH (ref 98–192)

## 2018-05-28 LAB — SARS CORONAVIRUS 2 BY RT PCR (HOSPITAL ORDER, PERFORMED IN ~~LOC~~ HOSPITAL LAB): SARS Coronavirus 2: NEGATIVE

## 2018-05-28 LAB — TROPONIN I: Troponin I: <0.03 ng/mL (ref <0.03)

## 2018-05-28 LAB — FERRITIN: Ferritin: 1229 ng/mL — ABNORMAL HIGH (ref 11–307)

## 2018-05-28 LAB — MAGNESIUM: Magnesium: 1.5 mg/dL — ABNORMAL LOW (ref 1.7–2.4)

## 2018-05-28 LAB — LACTIC ACID, PLASMA
Lactic Acid, Venous: 2.2 mmol/L (ref 0.5–1.9)
Lactic Acid, Venous: 2.2 mmol/L (ref 0.5–1.9)

## 2018-05-28 LAB — PROCALCITONIN: Procalcitonin: 3.99 ng/mL

## 2018-05-28 LAB — I-STAT BETA HCG BLOOD, ED (MC, WL, AP ONLY): I-stat hCG, quantitative: <5 m[IU]/mL (ref <5)

## 2018-05-28 LAB — FIBRINOGEN: Fibrinogen: >800 mg/dL — ABNORMAL HIGH (ref 210–475)

## 2018-05-28 LAB — D-DIMER, QUANTITATIVE: D-Dimer, Quant: 0.89 ug/mL-FEU — ABNORMAL HIGH (ref 0.00–0.50)

## 2018-05-28 LAB — TRIGLYCERIDES: Triglycerides: 312 mg/dL — ABNORMAL HIGH (ref <150)

## 2018-05-28 MED ORDER — SODIUM CHLORIDE 0.9 % IV BOLUS
1000.0000 mL | Freq: Once | INTRAVENOUS | Status: AC
  Administered 2018-05-28: 14:00:00 1000 mL via INTRAVENOUS

## 2018-05-28 MED ORDER — SULFAMETHOXAZOLE-TRIMETHOPRIM 800-160 MG PO TABS: 2.0000 | ORAL_TABLET | Freq: Three times a day (TID) | ORAL | Status: DC

## 2018-05-28 MED ORDER — SODIUM CHLORIDE 0.9 % IV SOLN: 1.0000 g | INTRAVENOUS | Status: DC

## 2018-05-28 MED ORDER — SODIUM CHLORIDE 0.9 % IV SOLN: 500.0000 mg | INTRAVENOUS | Status: DC

## 2018-05-28 MED ORDER — SODIUM CHLORIDE 0.9% FLUSH
3.0000 mL | Freq: Two times a day (BID) | INTRAVENOUS | Status: DC
  Administered 2018-05-28 – 2018-05-30 (×4): 3 mL via INTRAVENOUS

## 2018-05-28 MED ORDER — PREDNISONE 20 MG PO TABS
40.0000 mg | ORAL_TABLET | Freq: Two times a day (BID) | ORAL | Status: DC
  Administered 2018-05-28 – 2018-05-31 (×6): 40 mg via ORAL
  Filled 2018-05-28 (×6): qty 2

## 2018-05-28 MED ORDER — SODIUM CHLORIDE 0.9% FLUSH: 3.0000 mL | INTRAVENOUS | Status: DC | PRN

## 2018-05-28 MED ORDER — SODIUM CHLORIDE 0.9 % IV SOLN: 250.0000 mL | INTRAVENOUS | Status: DC | PRN

## 2018-05-28 MED ORDER — BICTEGRAVIR-EMTRICITAB-TENOFOV 50-200-25 MG PO TABS
1.0000 | ORAL_TABLET | Freq: Every day | ORAL | Status: DC
  Administered 2018-05-28 – 2018-05-31 (×4): 1 via ORAL
  Filled 2018-05-28 (×5): qty 1

## 2018-05-28 MED ORDER — SODIUM CHLORIDE 0.9 % IV SOLN
500.0000 mg | Freq: Once | INTRAVENOUS | Status: AC
  Administered 2018-05-28: 19:00:00 500 mg via INTRAVENOUS
  Filled 2018-05-28: qty 500

## 2018-05-28 MED ORDER — ACETAMINOPHEN 325 MG PO TABS
650.0000 mg | ORAL_TABLET | Freq: Once | ORAL | Status: AC
  Administered 2018-05-28: 650 mg via ORAL
  Filled 2018-05-28: qty 2

## 2018-05-28 MED ORDER — SODIUM CHLORIDE 0.9 % IV BOLUS
1000.0000 mL | Freq: Once | INTRAVENOUS | Status: AC
  Administered 2018-05-28: 1000 mL via INTRAVENOUS

## 2018-05-28 MED ORDER — PREDNISONE 20 MG PO TABS: 40.0000 mg | ORAL_TABLET | Freq: Every day | ORAL | Status: DC

## 2018-05-28 MED ORDER — ORAL CARE MOUTH RINSE
15.0000 mL | Freq: Two times a day (BID) | OROMUCOSAL | Status: DC
  Administered 2018-05-29 – 2018-05-31 (×5): 15 mL via OROMUCOSAL

## 2018-05-28 MED ORDER — SULFAMETHOXAZOLE-TRIMETHOPRIM 400-80 MG/5ML IV SOLN
320.0000 mg | Freq: Three times a day (TID) | INTRAVENOUS | Status: DC
  Administered 2018-05-28 – 2018-05-30 (×6): 320 mg via INTRAVENOUS
  Filled 2018-05-28 (×7): qty 20

## 2018-05-28 MED ORDER — PREDNISONE 20 MG PO TABS: 20.0000 mg | ORAL_TABLET | Freq: Every day | ORAL | Status: DC

## 2018-05-28 MED ORDER — CHLORHEXIDINE GLUCONATE CLOTH 2 % EX PADS
6.0000 | MEDICATED_PAD | Freq: Every day | CUTANEOUS | Status: DC
  Administered 2018-05-28 – 2018-05-29 (×2): 6 via TOPICAL

## 2018-05-28 MED ORDER — SODIUM CHLORIDE 0.9 % IV BOLUS
500.0000 mL | Freq: Once | INTRAVENOUS | Status: AC
  Administered 2018-05-28: 500 mL via INTRAVENOUS

## 2018-05-28 MED ORDER — SODIUM CHLORIDE 0.9 % IV SOLN
1.0000 g | Freq: Once | INTRAVENOUS | Status: AC
  Administered 2018-05-28: 16:00:00 1 g via INTRAVENOUS
  Filled 2018-05-28: qty 10

## 2018-05-28 NOTE — ED Notes (Signed)
ED TO INPATIENT HANDOFF REPORT  ED Nurse Name and Phone #:  [phone]  S Name/Age/Gender Krystal Short 33 y.o. female Room/Bed: WA19/WA19  Code Status   Code Status: Prior  Home/SNF/Other Home Patient oriented to: self, place, time and situation Is this baseline? Yes   Triage Complete: Triage complete  Chief Complaint cough  chills   Triage Note Patient c/o non productive cough, body aches, fatigue, and a fever x 5 days(none in the past 2 days). Patient went to [facility] and was told to come to the ED for possible admission. Patient states she was test for C-19 last week and was negative, but [facility] did another test today and results not back.   Allergies No Known Allergies  Level of Care/Admitting Diagnosis ED Disposition    ED Disposition Condition [facility] Area: [place] [[id number]]  Level of Care: Stepdown [14]  Admit to SDU based on following criteria: Hemodynamic compromise or significant risk of instability:  Patient requiring short term acute titration and management of vasoactive drips, and invasive monitoring (i.e., CVP and Arterial line).  Covid Evaluation: N/A  Diagnosis: Pneumonia [227785]  Admitting Physician: [name] [[id number]]  Attending Physician: Dana Allan I [[id number]]  Estimated length of stay: past midnight tomorrow  Certification:: I certify this patient will need inpatient services for at least 2 midnights  PT Class (Do Not Modify): Inpatient [101]  PT Acc Code (Do Not Modify): Private [1]       B Medical/Surgery History Past Medical History:  Diagnosis Date  . Acute hyponatremia 06/03/2017  . CAP (community acquired pneumonia) 06/03/2017  . Community acquired pneumonia 06/05/2017  . Facial dermatitis 06/03/2017  . HIV (human immunodeficiency virus infection) ([place])   . Pleurisy 06/03/2017  . Pneumonia 06/06/2017  . 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 Thrush of  mouth and esophagus ([place])   . UTI (urinary tract infection)    Past Surgical History:  Procedure Laterality Date  . NO PAST SURGERIES       A IV Location/Drains/Wounds Patient Lines/Drains/Airways Status   Active Line/Drains/Airways    Name:   Placement date:   Placement time:   Site:   Days:   Peripheral IV 05/28/18 Right Antecubital   05/28/18    1417    Antecubital   less than 1          Intake/Output Last 24 hours  Intake/Output Summary (Last 24 hours) at 05/28/2018 1756 Last data filed at 05/28/2018 1644 Gross per 24 hour  Intake 1000 ml  Output -  Net 1000 ml    Labs/Imaging Results for orders placed or performed during the hospital encounter of 05/28/18 (from the past 48 hour(s))  CBC with Differential     Status: Abnormal   Collection Time: 05/28/18  1:22 PM  Result Value Ref Range   WBC 10.0 4.0 - 10.5 K/uL    Comment: REPEATED TO VERIFY WHITE COUNT CONFIRMED ON SMEAR    RBC 2.55 (L) 3.87 - 5.11 MIL/uL   Hemoglobin 8.6 (L) 12.0 - 15.0 g/dL   HCT 25.4 (L) 36.0 - 46.0 %   MCV 99.6 80.0 - 100.0 fL   MCH 33.7 26.0 - 34.0 pg   MCHC 33.9 30.0 - 36.0 g/dL   RDW 15.1 11.5 - 15.5 %   Platelets 68 (L) 150 - 400 K/uL    Comment: REPEATED TO VERIFY PLATELET COUNT CONFIRMED BY SMEAR SPECIMEN CHECKED FOR CLOTS  Immature Platelet Fraction may be clinically indicated, consider ordering this additional test [id number]    nRBC 0.0 0.0 - 0.2 %   Neutrophils Relative % 78 %   Lymphocytes Relative 3 %   Monocytes Relative 10 %   Eosinophils Relative 0 %   Basophils Relative 0 %   Band Neutrophils 7 %   Metamyelocytes Relative 1 %   Myelocytes 1 %   Promyelocytes Relative 0 %   Blasts 0 %   nRBC 0 0 /100 WBC   Other 0 %   Neutro Abs 8.7 (H) 1.7 - 7.7 K/uL   Lymphs Abs 0.3 (L) 0.7 - 4.0 K/uL   Monocytes Absolute 1.0 0.1 - 1.0 K/uL   Eosinophils Absolute 0.0 0.0 - 0.5 K/uL   Basophils Absolute 0.0 0.0 - 0.1 K/uL   WBC Morphology MILD LEFT SHIFT (1-5% METAS, OCC  MYELO, OCC BANDS)     Comment: TOXIC GRANULATION DOHLE BODIES WHITE COUNT CONFIRMED ON SMEAR Performed at [facility], [place] [address], [place], [place] [address]   Comprehensive metabolic panel     Status: Abnormal   Collection Time: 05/28/18  1:22 PM  Result Value Ref Range   Sodium 132 (L) 135 - 145 mmol/L   Potassium 4.0 3.5 - 5.1 mmol/L   Chloride 104 98 - 111 mmol/L   CO2 16 (L) 22 - 32 mmol/L   Glucose, Bld 91 70 - 99 mg/dL   BUN 16 6 - 20 mg/dL   Creatinine, Ser 1.02 (H) 0.44 - 1.00 mg/dL   Calcium 8.2 (L) 8.9 - 10.3 mg/dL   Total Protein 8.1 6.5 - 8.1 g/dL   Albumin 2.4 (L) 3.5 - 5.0 g/dL   AST 44 (H) 15 - 41 U/L   ALT 31 0 - 44 U/L   Alkaline Phosphatase 180 (H) 38 - 126 U/L   Total Bilirubin 1.2 0.3 - 1.2 mg/dL   GFR calc non Af Amer >60 >60 mL/min   GFR calc Af Amer >60 >60 mL/min   Anion gap 12 5 - 15    Comment: Performed at [facility], [place] [address], [place], [place] [address]  Lactate dehydrogenase     Status: Abnormal   Collection Time: 05/28/18  1:22 PM  Result Value Ref Range   LDH 566 (H) 98 - 192 U/L    Comment: Performed at [facility], [place] [address] [place], [place] [address]  Lactic acid, plasma     Status: Abnormal   Collection Time: 05/28/18  1:22 PM  Result Value Ref Range   Lactic Acid, Venous 2.2 (HH) 0.5 - 1.9 mmol/L    Comment: CRITICAL RESULT CALLED TO, READ BACK BY AND VERIFIED WITH: Tsosie Billing [phone] @ [place] Performed at [facility], [place] [address], [place], [place] [address]   Troponin I - ONCE - STAT     Status: None   Collection Time: 05/28/18  1:22 PM  Result Value Ref Range   Troponin I <0.03 <0.03 ng/mL    Comment: Performed at [facility], [place] [address] [place], [place] [address]  D-dimer, quantitative     Status: Abnormal   Collection Time: 05/28/18  1:22 PM  Result Value Ref Range   D-Dimer, Quant 0.89  (H) 0.00 - 0.50 ug/mL-FEU    Comment: (NOTE) At the manufacturer cut-off of 0.50 ug/mL FEU, this assay has been documented to exclude PE with a sensitivity and negative  predictive value of 97 to 99%.  At this time, this assay has not been approved by the FDA to exclude DVT/VTE. Results should be correlated with clinical presentation. Performed at [facility], [place] [address] [place], [place] [address]   Procalcitonin     Status: None   Collection Time: 05/28/18  1:22 PM  Result Value Ref Range   Procalcitonin 3.99 ng/mL    Comment:        Interpretation: PCT > 2 ng/mL: Systemic infection (sepsis) is likely, unless other causes are known. (NOTE)       Sepsis PCT Algorithm           Lower Respiratory Tract                                      Infection PCT Algorithm    ----------------------------     ----------------------------         PCT < 0.25 ng/mL                PCT < 0.10 ng/mL         Strongly encourage             Strongly discourage   discontinuation of antibiotics    initiation of antibiotics    ----------------------------     -----------------------------       PCT 0.25 - 0.50 ng/mL            PCT 0.10 - 0.25 ng/mL               OR       >80% decrease in PCT            Discourage initiation of                                            antibiotics      Encourage discontinuation           of antibiotics    ----------------------------     -----------------------------         PCT >= 0.50 ng/mL              PCT 0.26 - 0.50 ng/mL               AND       <80% decrease in PCT              Encourage initiation of                                             antibiotics       Encourage continuation           of antibiotics    ----------------------------     -----------------------------        PCT >= 0.50 ng/mL                  PCT > 0.50 ng/mL               AND         increase in PCT  Strongly encourage                                       initiation of antibiotics    Strongly encourage escalation           of antibiotics                                     -----------------------------                                           PCT <= 0.25 ng/mL                                                 OR                                        > 80% decrease in PCT                                     Discontinue / Do not initiate                                             antibiotics Performed at [place] [address] [place], [place] [address]   Fibrinogen     Status: Abnormal   Collection Time: 05/28/18  1:22 PM  Result Value Ref Range   Fibrinogen >800 (H) 210 - 475 mg/dL    Comment: Performed at [facility], [place] [address], [place], [place] [address]  SARS Coronavirus 2 [facility] order, Performed in [facility] hospital lab)     Status: None   Collection Time: 05/28/18  1:25 PM  Result Value Ref Range   SARS Coronavirus 2 NEGATIVE NEGATIVE    Comment: (NOTE) If result is NEGATIVE SARS-CoV-2 target nucleic acids are NOT DETECTED. The SARS-CoV-2 RNA is generally detectable in upper and lower  respiratory specimens during the acute phase of infection. The lowest  concentration of SARS-CoV-2 viral copies this assay can detect is 250  copies / mL. A negative result does not preclude SARS-CoV-2 infection  and should not be used as the sole basis for treatment or other  patient management decisions.  A negative result may occur with  improper specimen collection / handling, submission of specimen other  than nasopharyngeal swab, presence of viral mutation(s) within the  areas targeted by this assay, and inadequate number of viral copies  (<250 copies / mL). A negative result must be combined with clinical  observations, patient history, and epidemiological information. If result is POSITIVE SARS-CoV-2 target nucleic acids are DETECTED. The SARS-CoV-2 RNA is generally  detectable in upper and lower  respiratory specimens dur ing the acute phase of infection.  Positive  results are indicative of active infection  with SARS-CoV-2.  Clinical  correlation with patient history and other diagnostic information is  necessary to determine patient infection status.  Positive results do  not rule out bacterial infection or co-infection with other viruses. If result is PRESUMPTIVE POSTIVE SARS-CoV-2 nucleic acids MAY BE PRESENT.   A presumptive positive result was obtained on the submitted specimen  and confirmed on repeat testing.  While 2019 novel coronavirus  (SARS-CoV-2) nucleic acids may be present in the submitted sample  additional confirmatory testing may be necessary for epidemiological  and / or clinical management purposes  to differentiate between  SARS-CoV-2 and other Sarbecovirus currently known to infect humans.  If clinically indicated additional testing with an alternate test  methodology [id number]) is advised. The SARS-CoV-2 RNA is generally  detectable in upper and lower respiratory sp ecimens during the acute  phase of infection. The expected result is Negative. Fact Sheet for Patients:  StrictlyIdeas.no Fact Sheet for Healthcare Providers: BankingDealers.co.za This test is not yet approved or cleared by the [place] FDA and has been authorized for detection and/or diagnosis of SARS-CoV-2 by FDA under an Emergency Use Authorization (EUA).  This EUA will remain in effect (meaning this test can be used) for the duration of the COVID-19 declaration under Section 564(b)(1) of the Act, 21 U.S.C. section 360bbb-3(b)(1), unless the authorization is terminated or revoked sooner. Performed at [facility], [place] [address], [place], [place] [address]   Ferritin     Status: Abnormal   Collection Time: 05/28/18  1:25 PM  Result Value Ref Range   Ferritin 1,229 (H) 11 - 307 ng/mL     Comment: Performed at [facility], [place] [address], [place], [place] [address]  Triglycerides     Status: Abnormal   Collection Time: 05/28/18  1:25 PM  Result Value Ref Range   Triglycerides 312 (H) <150 mg/dL    Comment: Performed at [facility], [place] [address], [place], [place] [address]  C-reactive protein     Status: Abnormal   Collection Time: 05/28/18  1:25 PM  Result Value Ref Range   CRP 31.0 (H) <1.0 mg/dL    Comment: Performed at [facility], [place] [address], [place], [place] [address]  I-Stat Beta hCG blood, ED ([facility], [facility], [facility] only)     Status: None   Collection Time: 05/28/18  2:15 PM  Result Value Ref Range   I-stat hCG, quantitative <5.0 <5 mIU/mL   Comment 3            Comment:   GEST. AGE      CONC.  (mIU/mL)   <=1 WEEK        5 - 50     2 WEEKS       50 - 500     3 WEEKS       100 - 10,000     4 WEEKS     1,000 - 30,000        FEMALE AND NON-PREGNANT FEMALE:     LESS THAN 5 mIU/mL   Blood gas, venous     Status: Abnormal   Collection Time: 05/28/18  4:51 PM  Result Value Ref Range   pH, Ven 7.377 7.250 - 7.430   pCO2, Ven 29.5 (L) 44.0 - 60.0 mmHg   pO2, Ven 44.2 32.0 - 45.0 mmHg   Bicarbonate 16.9 (L) 20.0 - 28.0 mmol/L   Acid-base deficit 7.1 (H) 0.0 - 2.0 mmol/L   O2  Saturation 72.7 %   Patient temperature 98.6    Collection site DRAWN BY RN    Drawn by DRAWN BY RN    Sample type VEIN     Comment: Performed at [place] [address], [place], [place] [address]   Dg Chest Portable 1 View  Result Date: 05/28/2018 CLINICAL DATA:  Cough, fever. EXAM: PORTABLE CHEST 1 VIEW COMPARISON:  Radiographs of March 08, 2018. FINDINGS: The heart size and mediastinal contours are within normal limits. No pneumothorax or pleural effusion is noted. Increased diffuse reticular densities are noted throughout both lungs, concerning for atypical infection or pneumonia. The visualized skeletal  structures are unremarkable. IMPRESSION: Increased bilateral diffuse reticular densities are noted concerning for atypical infection or pneumonia. Electronically Signed   By: Marijo Conception M.D.   On: 05/28/2018 14:43    Pending Labs Unresulted Labs (From admission, onward)    Start     Ordered   05/28/18 1353  Urinalysis, Routine w reflex microscopic  ONCE - STAT,   STAT     05/28/18 1353   05/28/18 1326  Respiratory Panel by PCR  (Respiratory virus panel with precautions)  Once,   R    Question:  Patient immune status  Answer:  Immunocompromised   05/28/18 1325   05/28/18 1325  Blood Culture (routine x 2)  BLOOD CULTURE X 2,   STAT    Question:  Patient immune status  Answer:  Immunocompromised   05/28/18 1325   05/28/18 1322  Lactic acid, plasma  Now then every 2 hours,   STAT     05/28/18 1322   Signed and Held  Legionella Pneumophila Serogp 1 Ur Ag  Once,   R     Signed and Held   Signed and Held  Strep pneumoniae urinary antigen  Once,   R     Signed and Held   Signed and Held  Expectorated sputum assessment w rflx to resp cult  Once,   R     Signed and Held   Visual merchandiser and Held  Magnesium  Once,   R     Signed and Held   Visual merchandiser and Held  Phosphorus  Once,   R     Signed and Held   Visual merchandiser and Occupational hygienist morning,   R     Signed and Held   Visual merchandiser and Held  CBC  Tomorrow morning,   R     Signed and Held   Signed and Held  Sodium, urine, random  Once,   R     Signed and Held   Signed and Held  Osmolality  Once,   R     Signed and Held   Signed and Held  Osmolality, urine  Once,   R     Signed and Held   Signed and Held  Heparin induced platelet Ab (HIT antibody)  Once,   R     Signed and Held          Vitals/Pain Today's Vitals   05/28/18 1537 05/28/18 1600 05/28/18 1630 05/28/18 1700  BP: 91/67 (!) 89/60 (!) 87/66 92/63  Pulse: (!) 118 (!) 110 (!) 104 97  Resp: (!) 25 (!) 23 (!) 28 (!) 28  Temp:      TempSrc:      SpO2: 100% 100% 100%  100%  Weight:      Height:      PainSc:  Isolation Precautions Droplet precaution  Medications Medications  azithromycin (ZITHROMAX) 500 mg in sodium chloride 0.9 % 250 mL IVPB (has no administration in time range)  predniSONE (DELTASONE) tablet 40 mg (has no administration in time range)  sulfamethoxazole-trimethoprim (BACTRIM) 320 mg in dextrose 5 % 500 mL IVPB (has no administration in time range)  sodium chloride 0.9 % bolus 1,000 mL (0 mLs Intravenous Stopped 05/28/18 1644)  acetaminophen (TYLENOL) tablet 650 mg (650 mg Oral Given 05/28/18 1418)  cefTRIAXone (ROCEPHIN) 1 g in sodium chloride 0.9 % 100 mL IVPB (1 g Intravenous New Bag/Given 05/28/18 1533)  sodium chloride 0.9 % bolus 1,000 mL (1,000 mLs Intravenous New Bag/Given 05/28/18 1644)    Mobility walks Low fall risk   Focused Assessments Pulmonary Assessment Handoff:  Lung sounds:   O2 Device: Room Air  Nasal cannula 2L      R Recommendations: See Admitting Provider Note  Report given to:   Additional Notes:

## 2018-05-28 NOTE — Progress Notes (Signed)
CRITICAL VALUE ALERT  Critical Value:  Lactic Acid 2.2  Date & Time Notied:  05/28/18 2120  Provider Notified: [facility] paged  Orders Received/Actions taken: Awaiting orders  Mariann Laster RN

## 2018-05-28 NOTE — ED Triage Notes (Signed)
Patient c/o non productive cough, body aches, fatigue, and a fever x 5 days(none in the past 2 days). Patient went to [facility] and was told to come to the ED for possible admission. Patient states she was test for C-19 last week and was negative, but [facility] did another test today and results not back.

## 2018-05-28 NOTE — Assessment & Plan Note (Signed)
[name] has well controlled AIDS, however her CD4 count is 10. I have concerns for pneumonia despite her prophylaxis with Bactrim. Recommend continuing [place] and Bactrim pending work up.

## 2018-05-28 NOTE — H&P (Signed)
History and Physical  Krystal Short CNO:[id number] DOB: 10/01/1985 DOA: 05/28/2018  Referring physician: ER physician PCP: Dianna Rossetti, NP (Inactive)  Outpatient Specialists:    Patient coming from: Home  Chief Complaint: Fever, shortness of breath and cough for a week.  HPI: Patient is a 33 year old African-American female that tells me that she was diagnosed with HIV about a year ago.  Patient reports being married to her husband for at least 2 years, and has been with him for 5 years.  CD4 count done February 2020 was 10.  Other past medical problems includes pneumocystis Jirovecii pneumonia (PCP pneumonia), oral thrush and candidal esophagitis, community-acquired pneumonia and hyponatremia.  Patient presents with 1 week history of fever, shortness of breath, cough that is minimally productive, fever and night sweats.  No headache, no neck pain, no URI symptoms, no GI symptoms and no urinary symptoms.  ED Course: Patient was started on IV antibiotics.  Case was discussed with infectious disease team.  Hospitalist team was called to admit patient.  Pertinent labs: WBC of 10, hemoglobin of 8.6, hematocrit of 25.4, MCV of 99.0 platelet count of 68.  Chemistry reveals sodium of 132, potassium of 4, chloride 104, CO2 of 16, BUN of 16 with creatinine of 1.02 and blood sugar of 91.  LDH is 566.  Troponin is less than 0.03.  Ferritin is 1229, CRP of 31, lactic acid of 2.2 with procalcitonin of 3.99.  0.89 with a fibrinogen of greater than 800.  EKG: Independently reviewed.   Imaging: independently reviewed.   Review of Systems:  Negative for visual changes, sore throat, rash, new muscle aches, chest pain, dysuria, bleeding, n/v/abdominal pain.  Past Medical History:  Diagnosis Date  . Acute hyponatremia 06/03/2017  . CAP (community acquired pneumonia) 06/03/2017  . Community acquired pneumonia 06/05/2017  . Facial dermatitis 06/03/2017  . HIV (human immunodeficiency virus infection) ([place])   .  Pleurisy 06/03/2017  . Pneumonia 06/06/2017  . 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 ([place])   . Thrush of mouth and esophagus ([place])   . UTI (urinary tract infection)     Past Surgical History:  Procedure Laterality Date  . NO PAST SURGERIES       reports that she quit smoking about 17 months ago. Her smoking use included cigarettes. She has never used smokeless tobacco. She reports current alcohol use. She reports that she does not use drugs.  No Known Allergies  Family History  Problem Relation Age of Onset  . Healthy Father   . Healthy Mother      Prior to Admission medications   Medication Sig Start Date End Date Taking? Authorizing Provider  acetaminophen (TYLENOL) 325 MG tablet Take 650 mg by mouth every 6 (six) hours as needed for mild pain or headache.   Yes [provider]  bictegravir-emtricitabine-tenofovir AF (BIKTARVY) 50-200-25 MG TABS tablet Take 1 tablet by mouth daily. 04/22/18  Yes Golden Circle, FNP  imiquimod [place]) 5 % cream Apply 1 application topically See admin instructions. Every 3 days 03/05/18  Yes [provider]  ondansetron (ZOFRAN) 4 MG tablet Take 1 tablet (4 mg total) by mouth every 6 (six) hours. 05/24/18  Yes Raylene Everts, MD  sulfamethoxazole-trimethoprim (BACTRIM DS) 800-160 MG tablet Take 1 tablet by mouth daily.   Yes [provider]    Physical Exam: Vitals:   05/28/18 1406 05/28/18 1500 05/28/18 1530 05/28/18 1537  BP: 93/67 91/64 91/67  91/67  Pulse: (!) 127 (!) 120 Marland Kitchen)  119 (!) 118  Resp: (!) 26 (!) 42 (!) 40 (!) 25  Temp:      TempSrc:      SpO2: 95% 100% 100% 100%  Weight:      Height:        Constitutional:  . Appears calm and comfortable. Eyes:  . No pallor. No jaundice.  ENMT:  . external ears, nose appear normal Neck:  . Neck is supple. No JVD Respiratory:  . CTA bilaterally, no w/r/r.  . Respiratory effort normal. No retractions or accessory muscle use  Cardiovascular:  . S1S2 . No LE extremity edema   Abdomen:  . Abdomen is soft and non tender. Organs are difficult to assess. Neurologic:  . Awake and alert. . Moves all limbs.  Wt Readings from Last 3 Encounters:  05/28/18 56.7 kg  05/10/18 56.7 kg  04/01/18 55.2 kg    I have personally reviewed following labs and imaging studies  Labs on Admission:  CBC: Recent Labs  Lab 05/24/18 0925 05/28/18 1322  WBC 10.5 10.0  NEUTROABS  --  8.7*  HGB 11.7* 8.6*  HCT 32.8* 25.4*  MCV 97.3 99.6  PLT 75* 68*   Basic Metabolic Panel: Recent Labs  Lab 05/24/18 0925 05/28/18 1322  NA 127* 132*  K 5.1 4.0  CL 100 104  CO2 18* 16*  GLUCOSE 111* 91  BUN 13 16  CREATININE 1.10* 1.02*  CALCIUM 8.6* 8.2*   Liver Function Tests: Recent Labs  Lab 05/28/18 1322  AST 44*  ALT 31  ALKPHOS 180*  BILITOT 1.2  PROT 8.1  ALBUMIN 2.4*   No results for input(s): LIPASE, AMYLASE in the last 168 hours. No results for input(s): AMMONIA in the last 168 hours. Coagulation Profile: No results for input(s): INR, PROTIME in the last 168 hours. Cardiac Enzymes: Recent Labs  Lab 05/28/18 1322  TROPONINI <0.03   BNP (last 3 results) No results for input(s): PROBNP in the last 8760 hours. HbA1C: No results for input(s): HGBA1C in the last 72 hours. CBG: No results for input(s): GLUCAP in the last 168 hours. Lipid Profile: Recent Labs    05/28/18 1325  TRIG 312*   Thyroid Function Tests: No results for input(s): TSH, T4TOTAL, FREET4, T3FREE, THYROIDAB in the last 72 hours. Anemia Panel: Recent Labs    05/28/18 1325  FERRITIN 1,229*   Urine analysis:    Component Value Date/Time   COLORURINE YELLOW 03/08/2018 1722   APPEARANCEUR CLEAR 03/08/2018 1722   LABSPEC 1.015 03/08/2018 1722   PHURINE 6.0 03/08/2018 1722   GLUCOSEU NEGATIVE 03/08/2018 1722   HGBUR NEGATIVE 03/08/2018 1722   BILIRUBINUR NEGATIVE 03/08/2018 1722   KETONESUR NEGATIVE 03/08/2018 1722   PROTEINUR  NEGATIVE 03/08/2018 1722   UROBILINOGEN 1.0 12/03/2011 0550   NITRITE NEGATIVE 03/08/2018 1722   LEUKOCYTESUR NEGATIVE 03/08/2018 1722   Sepsis Labs: @LABRCNTIP (procalcitonin:4,lacticidven:4) ) Recent Results (from the past 240 hour(s))  SARS Coronavirus 2 [facility] order, Performed in [place] hospital lab)     Status: None   Collection Time: 05/28/18  1:25 PM  Result Value Ref Range Status   SARS Coronavirus 2 NEGATIVE NEGATIVE Final    Comment: (NOTE) If result is NEGATIVE SARS-CoV-2 target nucleic acids are NOT DETECTED. The SARS-CoV-2 RNA is generally detectable in upper and lower  respiratory specimens during the acute phase of infection. The lowest  concentration of SARS-CoV-2 viral copies this assay can detect is 250  copies / mL. A negative result does not preclude SARS-CoV-2  infection  and should not be used as the sole basis for treatment or other  patient management decisions.  A negative result may occur with  improper specimen collection / handling, submission of specimen other  than nasopharyngeal swab, presence of viral mutation(s) within the  areas targeted by this assay, and inadequate number of viral copies  (<250 copies / mL). A negative result must be combined with clinical  observations, patient history, and epidemiological information. If result is POSITIVE SARS-CoV-2 target nucleic acids are DETECTED. The SARS-CoV-2 RNA is generally detectable in upper and lower  respiratory specimens dur ing the acute phase of infection.  Positive  results are indicative of active infection with SARS-CoV-2.  Clinical  correlation with patient history and other diagnostic information is  necessary to determine patient infection status.  Positive results do  not rule out bacterial infection or co-infection with other viruses. If result is PRESUMPTIVE POSTIVE SARS-CoV-2 nucleic acids MAY BE PRESENT.   A presumptive positive result was obtained on the submitted specimen   and confirmed on repeat testing.  While 2019 novel coronavirus  (SARS-CoV-2) nucleic acids may be present in the submitted sample  additional confirmatory testing may be necessary for epidemiological  and / or clinical management purposes  to differentiate between  SARS-CoV-2 and other Sarbecovirus currently known to infect humans.  If clinically indicated additional testing with an alternate test  methodology [phone]) is advised. The SARS-CoV-2 RNA is generally  detectable in upper and lower respiratory sp ecimens during the acute  phase of infection. The expected result is Negative. Fact Sheet for Patients:  StrictlyIdeas.no Fact Sheet for Healthcare Providers: BankingDealers.co.za This test is not yet approved or cleared by the [place] FDA and has been authorized for detection and/or diagnosis of SARS-CoV-2 by FDA under an Emergency Use Authorization (EUA).  This EUA will remain in effect (meaning this test can be used) for the duration of the COVID-19 declaration under Section 564(b)(1) of the Act, 21 U.S.C. section 360bbb-3(b)(1), unless the authorization is terminated or revoked sooner. Performed at [facility], [place] [address], [place], [place] [address]       Radiological Exams on Admission: Dg Chest Portable 1 View  Result Date: 05/28/2018 CLINICAL DATA:  Cough, fever. EXAM: PORTABLE CHEST 1 VIEW COMPARISON:  Radiographs of March 08, 2018. FINDINGS: The heart size and mediastinal contours are within normal limits. No pneumothorax or pleural effusion is noted. Increased diffuse reticular densities are noted throughout both lungs, concerning for atypical infection or pneumonia. The visualized skeletal structures are unremarkable. IMPRESSION: Increased bilateral diffuse reticular densities are noted concerning for atypical infection or pneumonia. Electronically Signed   By: Marijo Conception M.D.   On:  05/28/2018 14:43    EKG: Independently reviewed.   Active Problems:   * No active hospital problems. *   Assessment/Plan Likely pneumonia, worrisome for PCP pneumonia: -Admit patient for further assessment and management -Induced sputum for PCP -IV Bactrim -ABG -Consider steroids if indicated -Infectious disease input is appreciated  HIV/AIDS: Last CD4 count was 10 (about 2 months ago) Infectious disease team is directing management  Thrombocytopenia: Cause is unclear at the moment Check HIV 1 and 2 antibodies Further management will depend on hospital course  DVT prophylaxis: SCD Code Status: Full code Family Communication:  Disposition Plan: Home eventually Consults called: ER has consulted infectious disease team Admission status: inpatient  Time spent: 65 minutes  Dana Allan, MD  [facility] Pager #: [phone]  7PM-7AM contact night coverage as above  05/28/2018, 3:42 PM

## 2018-05-28 NOTE — Progress Notes (Signed)
Subjective:    Patient ID: Krystal Short, female    DOB: 06-Aug-1985, 33 y.o.   MRN: [id number]  Chief Complaint  Patient presents with   Shortness of Breath     Virtual Visit via Telephone Note   I connected with [name] on 05/28/2018 at 11am by telephone and verified that I am speaking with the correct person using two identifiers.   I discussed the limitations, risks, security and privacy concerns of performing an evaluation and management service by telephone and the availability of in person appointments. I also discussed with the patient that there may be a patient responsible charge related to this service. The patient expressed understanding and agreed to proceed.   HPI:  Krystal Short is a 33 y.o. female with HIV disease who began feeling ill in the beginning of April following being diagnosed with influenza s/p treatment with Tamiflu about 1 month prior. Symptoms started of with cough and waxing and waning fever for the past couple of weeks with temperatures ranging from 100.3 up to 103. She experienced worsening of symptoms about 2 days ago with onset of night sweats, body aches and fatigue. Her last fever was Sunday and has been afebrile for the past 24 hours. Cough has been non-productive and she has had increasing shortness of breath with minimal activity level. She was tested for Coronavirus on April 9th which was negative. 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 She has been out of work for the past couple of weeks. Seen in Urgent Care on 4/17 and diagnosed with viral illness and given 2 L of fluids for tachycardia and hypotension. She was seen at [facility] this morning and noted to have hypotension once again with tachycardia. Initially hypoxic but then 98% oxygen saturation.   She continues to take her Biktarvy as prescribed with no adverse side effects.  Most recent viral load was undetectable and CD4 count was 10. She remains on Bactrim double strength daily for PCP prophylaxis.   No Known Allergies    Facility-Administered Medications Prior to Visit  Medication Dose Short Frequency Provider Last Rate Last Dose   azithromycin (ZITHROMAX) 500 mg in sodium chloride 0.9 % 250 mL IVPB  500 mg Intravenous Once Gibbons, Claudia J, PA-C       sodium chloride 0.9 % bolus 1,000 mL  1,000 mL Intravenous Once Kinnie Feil, PA-C       Outpatient Medications Prior to Visit  Medication Sig Dispense Refill   bictegravir-emtricitabine-tenofovir AF (BIKTARVY) 50-200-25 MG TABS tablet Take 1 tablet by mouth daily. 30 tablet 3   imiquimod (ALDARA) 5 % cream Apply 1 application topically See admin instructions. Every 3 days     ondansetron (ZOFRAN) 4 MG tablet Take 1 tablet (4 mg total) by mouth every 6 (six) hours. 12 tablet 0   sulfamethoxazole-trimethoprim (BACTRIM DS) 800-160 MG tablet Take 1 tablet by mouth daily.       Past Medical History:  Diagnosis Date   Acute hyponatremia 06/03/2017   CAP (community acquired pneumonia) 06/03/2017   Community acquired pneumonia 06/05/2017   Facial dermatitis 06/03/2017   HIV (human immunodeficiency virus infection) ([place])    Pleurisy 06/03/2017   Pneumonia 06/06/2017   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 ([place])    Thrush of mouth and esophagus ([facility])    UTI (urinary tract infection)  Past Surgical History:  Procedure Laterality Date   NO PAST SURGERIES         Review of Systems  Constitutional: Positive for chills, fatigue and fever.  Respiratory: Positive for cough and shortness of breath. Negative for chest tightness and wheezing.   Cardiovascular: Negative for chest pain.  Gastrointestinal: Negative for abdominal pain, constipation, diarrhea, nausea and vomiting.  Neurological: Positive for dizziness and light-headedness.      Objective:    Nursing  note and vital signs reviewed.    Ms. Orzechowski is pleasant to speak with and sounds ill on the phone.  Assessment & Plan:   Problem List Items Addressed This Visit      Other   AIDS ([place]) - Primary    Ms. Cerny has well controlled AIDS, however her CD4 count is 10. I have concerns for pneumonia despite her prophylaxis with Bactrim. Recommend continuing [place] and Bactrim pending work up.       Shortness of breath    Ms. Lillo has shortness of breath with cough and fever concerning for pneumonia. Risk factors do not appear consistent with Coronavirus/Covid-19 however cannot be ruled out given her symptoms. Testing for Covid-19 performed with results pending. She is in need of further work up as I am concerned her symptoms are consistent with SIRS/SEPSIS. I have attempted to directly admit her with the hospitalist providing the recommendation to go directly to the ED. I have informed Ms. Mccomber of the plan to go to [facility] as she is being ruled out for coronavirus and informed Dr. Linus Salmons who is the ID provider on call regarding her situation.          I am having Kolina Losasso maintain her imiquimod, bictegravir-emtricitabine-tenofovir AF, ondansetron, and sulfamethoxazole-trimethoprim.   I discussed the assessment and treatment plan with the patient. The patient was provided an opportunity to ask questions and all were answered. The patient agreed with the plan and demonstrated an understanding of the instructions.   The patient was advised to call back or seek an in-person evaluation if the symptoms worsen or if the condition fails to improve as anticipated.   I provided  12 minutes of non-face-to-face time during this encounter.  Follow-up: Pending hospitalization.   Terri Piedra, MSN, FNP-C Nurse Practitioner [facility] for Infectious Disease [place] number: [phone]

## 2018-05-28 NOTE — Patient Instructions (Signed)
Good to speak with you.   Based on your symptoms and after speaking with the hospitalist, I recommend you go to the ED for further evaluation.

## 2018-05-28 NOTE — ED Provider Notes (Addendum)
[facility] DEPT Provider Note   CSN: [id number] Arrival date & time: 05/28/18  1257    History   Chief Complaint Chief Complaint  Patient presents with  . Shortness of Breath  . Cough  . Generalized Body Aches    HPI Krystal Short is a 33 y.o. female with history of HIV/AIDS CD4 count 10, Bactrim prophylaxis, anemia, molluscum contagiosum is here for evaluation of shortness of breath.  She went to [facility] earlier today and states they obtained nasopharyngeal swab for COVID-19 which is pending, reportedly the provider there called her ID doctor Milton NP who advised her to come to the ED.  Reports having cold-like symptoms since April 4, initially mild.  She went to urgent care on 4/17 for evaluation of this and received IV fluids and discharged with Zofran.  She has had worsening URI symptoms for the last 5 days including nighttime chills and sweats, increased rhinorrhea, dry cough, chest tightness and shortness of breath with exertion, nausea, vomiting, looser stools, body aches, extreme fatigue.  Her chest tightness and shortness of breath has progressed in the last 2 days.  She works in the [place] at [employer] and has been wearing a mask at work.  No known exposure to confirmed COVID.  No recent travel.  She lives with husband who has no symptoms.  Has been compliant with her HIV/AIDS medicines.  Has history of PCP pneumonia that required admission in January 2020.  No med/intervention prior to arrival.  No underlying cardiac or lung diseases.  No history of blood clots/PEs.  No recent risk factors for PE/DVT. She is teary eyed and worried about admission. No associated headache, neck pain, new rashes, urinary symptoms.    HPI  Past Medical History:  Diagnosis Date  . Acute hyponatremia 06/03/2017  . CAP (community acquired pneumonia) 06/03/2017  . Community acquired pneumonia 06/05/2017  . Facial dermatitis 06/03/2017  . HIV (human  immunodeficiency virus infection) ([place])   . Pleurisy 06/03/2017  . Pneumonia 06/06/2017  . 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 ([place])   . Thrush of mouth and esophagus ([place])   . UTI (urinary tract infection)     Patient Active Problem List   Diagnosis Date Noted  . Pneumonia 05/28/2018  . Acute respiratory failure with hypoxia ([place]) 03/08/2018  . CAP (community acquired pneumonia) 03/08/2018  . Healthcare maintenance 12/31/2017  . Cough 08/15/2017  . Molluscum contagiosum 06/27/2017  . AIDS ([place])   . Tachycardia 06/03/2017  . Tobacco abuse 06/03/2017    Past Surgical History:  Procedure Laterality Date  . NO PAST SURGERIES       OB History    Gravida  0   Para  0   Term  0   Preterm  0   AB  0   Living  0     SAB  0   TAB  0   Ectopic  0   Multiple  0   Live Births  0            Home Medications    Prior to Admission medications   Medication Sig Start Date End Date Taking? Authorizing Provider  acetaminophen (TYLENOL) 325 MG tablet Take 650 mg by mouth every 6 (six) hours as needed for mild pain or headache.   Yes [provider]  bictegravir-emtricitabine-tenofovir AF (BIKTARVY) 50-200-25 MG TABS tablet Take 1 tablet by mouth daily. 04/22/18  Yes Golden Circle, FNP  imiquimod [place])  5 % cream Apply 1 application topically See admin instructions. Every 3 days 03/05/18  Yes [provider]  ondansetron (ZOFRAN) 4 MG tablet Take 1 tablet (4 mg total) by mouth every 6 (six) hours. 05/24/18  Yes Raylene Everts, MD  sulfamethoxazole-trimethoprim (BACTRIM DS) 800-160 MG tablet Take 1 tablet by mouth daily.   Yes [provider]    Family History Family History  Problem Relation Age of Onset  . Healthy Father   . Healthy Mother     Social History Social History   Tobacco Use  . Smoking status: Former Smoker    Types: Cigarettes    Last attempt to quit: 12/25/2016    Years since quitting: 1.4   . Smokeless tobacco: Never Used  Substance Use Topics  . Alcohol use: Yes    Comment: weekend/socially  . Drug use: No     Allergies   Patient has no known allergies.   Review of Systems Review of Systems  Constitutional: Positive for chills, fatigue and fever.  HENT: Positive for rhinorrhea.   Respiratory: Positive for cough, chest tightness and shortness of breath.   Gastrointestinal: Positive for diarrhea, nausea and vomiting.  Musculoskeletal: Positive for myalgias.  Allergic/Immunologic: Positive for immunocompromised state.  All other systems reviewed and are negative.    Physical Exam Updated Vital Signs BP 91/67   Pulse (!) 118   Temp (!) 101.8 F (38.8 C) (Rectal)   Resp (!) 25   Ht 5' 1.5" (1.562 m)   Wt 56.7 kg   LMP 05/07/2018 (Within Days) Comment: spotting  SpO2 100%   BMI 23.24 kg/m   Physical Exam Vitals signs and nursing note reviewed.  Constitutional:      Appearance: She is well-developed.     Comments: Non toxic. Teary eyed   HENT:     Head: Normocephalic and atraumatic.     Nose: Nose normal.  Eyes:     Conjunctiva/sclera: Conjunctivae normal.  Neck:     Musculoskeletal: Normal range of motion.  Cardiovascular:     Rate and Rhythm: Regular rhythm. Tachycardia present.     Comments: SBP 88.  1+ radial and DP pulses bilaterally.  Brisk cap refill distally. Pulmonary:     Effort: Pulmonary effort is normal.     Breath sounds: Examination of the right-lower field reveals decreased breath sounds. Examination of the left-lower field reveals decreased breath sounds. Decreased breath sounds present.     Comments: Tachypneic.  No hypoxia noted during exam.  Diminished lung sounds to lower lobes but no wheezing, rales.  Speaking in full sentences. Abdominal:     General: Bowel sounds are normal.     Palpations: Abdomen is soft.     Tenderness: There is no abdominal tenderness.     Comments: No G/R/R. No suprapubic or CVA tenderness. Negative  Murphy's and McBurney's. Active BS to lower quadrants.   Musculoskeletal: Normal range of motion.  Skin:    General: Skin is warm and dry.     Capillary Refill: Capillary refill takes less than 2 seconds.     Comments: Flesh-colored, painless bumps around hairline and face.  Neurological:     Mental Status: She is alert.  Psychiatric:        Behavior: Behavior normal.      ED Treatments / Results  Labs (all labs ordered are listed, but only abnormal results are displayed) Labs Reviewed  CBC WITH DIFFERENTIAL/PLATELET - Abnormal; Notable for the following components:  Result Value   RBC 2.55 (*)    Hemoglobin 8.6 (*)    HCT 25.4 (*)    Platelets 68 (*)    Neutro Abs 8.7 (*)    Lymphs Abs 0.3 (*)    All other components within normal limits  COMPREHENSIVE METABOLIC PANEL - Abnormal; Notable for the following components:   Sodium 132 (*)    CO2 16 (*)    Creatinine, Ser 1.02 (*)    Calcium 8.2 (*)    Albumin 2.4 (*)    AST 44 (*)    Alkaline Phosphatase 180 (*)    All other components within normal limits  LACTATE DEHYDROGENASE - Abnormal; Notable for the following components:   LDH 566 (*)    All other components within normal limits  LACTIC ACID, PLASMA - Abnormal; Notable for the following components:   Lactic Acid, Venous 2.2 (*)    All other components within normal limits  D-DIMER, QUANTITATIVE (NOT AT [facility]) - Abnormal; Notable for the following components:   D-Dimer, Quant 0.89 (*)    All other components within normal limits  FERRITIN - Abnormal; Notable for the following components:   Ferritin 1,229 (*)    All other components within normal limits  TRIGLYCERIDES - Abnormal; Notable for the following components:   Triglycerides 312 (*)    All other components within normal limits  FIBRINOGEN - Abnormal; Notable for the following components:   Fibrinogen >800 (*)    All other components within normal limits  C-REACTIVE PROTEIN - Abnormal; Notable for the  following components:   CRP 31.0 (*)    All other components within normal limits  SARS CORONAVIRUS 2 (HOSPITAL ORDER, [facility])  CULTURE, BLOOD (ROUTINE X 2)  CULTURE, BLOOD (ROUTINE X 2)  RESPIRATORY PANEL BY PCR  TROPONIN I  PROCALCITONIN  LACTIC ACID, PLASMA  URINALYSIS, ROUTINE W REFLEX MICROSCOPIC  BLOOD GAS, ARTERIAL  I-STAT BETA HCG BLOOD, ED ([facility], [facility], [facility] ONLY)  I-STAT VENOUS BLOOD GAS, ED    EKG None  Radiology Dg Chest Portable 1 View  Result Date: 05/28/2018 CLINICAL DATA:  Cough, fever. EXAM: PORTABLE CHEST 1 VIEW COMPARISON:  Radiographs of March 08, 2018. FINDINGS: The heart size and mediastinal contours are within normal limits. No pneumothorax or pleural effusion is noted. Increased diffuse reticular densities are noted throughout both lungs, concerning for atypical infection or pneumonia. The visualized skeletal structures are unremarkable. IMPRESSION: Increased bilateral diffuse reticular densities are noted concerning for atypical infection or pneumonia. Electronically Signed   By: Marijo Conception M.D.   On: 05/28/2018 14:43    Procedures .Critical Care Performed by: Kinnie Feil, PA-C Authorized by: Kinnie Feil, PA-C   Critical care provider statement:    Critical care time (minutes):  45   Critical care was necessary to treat or prevent imminent or life-threatening deterioration of the following conditions:  Sepsis and respiratory failure   Critical care was time spent personally by me on the following activities:  Discussions with consultants, evaluation of patient's response to treatment, examination of patient, ordering and performing treatments and interventions, ordering and review of laboratory studies, ordering and review of radiographic studies, pulse oximetry, re-evaluation of patient's condition, obtaining history from patient or surrogate and review of old charts   I assumed direction of critical care for  this patient from another provider in my specialty: no     (including critical care time)  Medications Ordered in ED Medications  azithromycin (ZITHROMAX) 500 mg in sodium chloride 0.9 % 250 mL IVPB (has no administration in time range)  sodium chloride 0.9 % bolus 1,000 mL (has no administration in time range)  sodium chloride 0.9 % bolus 1,000 mL (1,000 mLs Intravenous New Bag/Given 05/28/18 1418)  acetaminophen (TYLENOL) tablet 650 mg (650 mg Oral Given 05/28/18 1418)  cefTRIAXone (ROCEPHIN) 1 g in sodium chloride 0.9 % 100 mL IVPB (1 g Intravenous New Bag/Given 05/28/18 1533)     Initial Impression / Assessment and Plan / ED Course  I have reviewed the triage vital signs and the nursing notes.  Pertinent labs & imaging results that were available during my care of the patient were reviewed by me and considered in my medical decision making (see chart for details).  Clinical Course as of May 27 1604  Tue May 28, 2018  1350 Temp(!): 101.8 F (38.8 C) [CG]  1351 Pulse Rate(!): 133 [CG]  1351 BP(!): 88/58 [CG]  1421 Spoke to Dr Novella Olive who agreesn with ctx and azithromycin for CAP, continue bactrim prophylaxis daily.    [CG]  1508 IMPRESSION: Increased bilateral diffuse reticular densities are noted concerning for atypical infection or pneumonia.  DG Chest Portable 1 View [CG]  [phone] RN notified me pt was at 89% on RA, low oxygen requirements <4L/min at this time   [CG]  1514 Creatinine(!): 1.02 [CG]  1516 AST(!): 44 [CG]  1516 Alkaline Phosphatase(!): 180 [CG]  1516 D-Dimer, Quant(!): 0.89 [CG]  1516 Fibrinogen(!): >800 [CG]  1516 Lactic Acid, Venous(!!): 2.2 [CG]  1516 LDH(!): 566 [CG]  1516 Ferritin(!): 1,229 [CG]  1516 Triglycerides(!): 312 [CG]  1516 CRP(!): 31.0 [CG]    Clinical Course User Index [CG] Kinnie Feil, PA-C       Immunocompromised patient with symptoms concerning for viral URI including COVID-19 versus other virus versus bacterial pneumonia versus  PCP pneumonia.  Lower suspicion for PE, ACS, HF exacerbation.   1400: Tachycardic, hypotensive, with rectal fever.  She meets SIRS criteria with source likely pulmonary.  Sepsis code initiated.  No labs at this time but chest x-ray reviewed by myself and EDMD looks abnormal.  Given low BP, SIRS criteria, will give 1 L IV fluids pending lactate.  She is tachypneic but has no hypoxia, rales or signs of fluid overload at this time.  We will plan to monitor closely.  Will call ID given history of AIDS for recommendations on antibiotics.  Anticipate admission.   1515: Transient hypoxia in ED now on [place] at < 4L/min. RR in the 40s when patient was crying, in the 20s otherwise.  Labs and imaging as above. COVID-19 negative, increasing suspicion for PCP PNA.  ID has been consulted.    1605: RT unable to obtain ABG, will get VBG.  Persistent soft BP in the low 90s, will give a second liter, MAP still >65.  Final Clinical Impressions(s) / ED Diagnoses   Final diagnoses:  SIRS (systemic inflammatory response syndrome) ([place])  Hypoxemia    ED Discharge Orders    None         Arlean Hopping 05/28/18 1606    Jola Schmidt, MD 05/28/18 [phone]

## 2018-05-28 NOTE — Progress Notes (Signed)
Pharmacy Antibiotic Note  Krystal Short is a 33 y.o. female admitted on 05/28/2018 with concern for PJP pneumonia.  Pharmacy has been consulted for Bactrim dosing.  Plan:  Bactrim 320mg  (Trimethoprim component) IV q8h.  Monitor renal function, K+, cultures, clinical course.    Height: 5' 1.5" (156.2 cm) Weight: 125 lb (56.7 kg) IBW/kg (Calculated) : 48.95  Temp (24hrs), Avg:100.2 F (37.9 C), Min:98.5 F (36.9 C), Max:101.8 F (38.8 C)  Recent Labs  Lab 05/24/18 0925 05/28/18 1322  WBC 10.5 10.0  CREATININE 1.10* 1.02*  LATICACIDVEN  --  2.2*    Estimated Creatinine Clearance: 61.8 mL/min (A) (by C-G formula based on SCr of 1.02 mg/dL (H)).    No Known Allergies  Antimicrobials this admission: 4/21 Ceftriaxone x 1 4/21 Azithromycin x 1 4/21 Bactrim >>  Dose adjustments this admission: --  Microbiology results: 4/21 BCx: sent 4/21 COVID-19: negative  4/21 RVP: ordered 4/21 Strep pneumo ur ag: ordered 4/21 Legionella ur ag: ordered  Thank you for allowing pharmacy to be a part of this patient's care.   Lindell Spar, PharmD, BCPS Pager: [phone] 05/28/2018 5:19 PM

## 2018-05-28 NOTE — Progress Notes (Signed)
RT attempted sputum induction with Flutter valve.  Pt has dry cough with fair effort and hesitant to cough due to pain.  Collection cup left at bedside for pt to utilize.  RT to monitor and assess as needed.

## 2018-05-28 NOTE — Consult Note (Signed)
[facility] for Infectious Disease       Reason for Consult: HIV, immunosuppression   Referring Physician: Dr. Alvy Beal  Active Problems:   Pneumonia  HIV   Recommendations: CT chest Bactrim at treatment doses for PJP - I will start Consider prednisone if PaO2 < 70  Assessment: She has reticular densities, low CD4 count, sob with activity concerning for PJP pneumonia.  Doubt CAP.  Densities on CXR though a bit atypical, would do a CT to better define.  Chronic anemia Relatively new thrombocytopenia of unknown etiology   Antibiotics: Starting Bactrim  HPI: Krystal Short is a 33 y.o. female with HIV well-controlled on Biktarvy but persistently low CD4 count sent in with concerns for pneumonia.  CXR with reticular areas and has had some DOE.  No fever.  Recently low platelets.  Covid-19 negative.  ABG ordered.  No sputum, no hemoptysis.  No associated rash. Recently Flu positive.  CXR independently reviewed and reticular areas noted.  Atypical a bit for PJP but clinically fits.  No typical for CAP.    Review of Systems:  Constitutional: negative for fevers, chills, anorexia and weight loss Respiratory: positive for dyspnea on exertion, negative for sputum, hemoptysis or pleurisy/chest pain Gastrointestinal: negative for nausea and diarrhea Musculoskeletal: negative for myalgias and arthralgias All other systems reviewed and are negative    Past Medical History:  Diagnosis Date  . Acute hyponatremia 06/03/2017  . CAP (community acquired pneumonia) 06/03/2017  . Community acquired pneumonia 06/05/2017  . Facial dermatitis 06/03/2017  . HIV (human immunodeficiency virus infection) ([place])   . Pleurisy 06/03/2017  . Pneumonia 06/06/2017  . 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 ([place])   . Thrush of mouth and esophagus ([place])   . UTI (urinary tract infection)     Social History   Tobacco Use  . Smoking status: Former Smoker    Types: Cigarettes   Last attempt to quit: 12/25/2016    Years since quitting: 1.4  . Smokeless tobacco: Never Used  Substance Use Topics  . Alcohol use: Yes    Comment: weekend/socially  . Drug use: No    Family History  Problem Relation Age of Onset  . Healthy Father   . Healthy Mother     No Known Allergies  Physical Exam: Constitutional: in no apparent distress  Vitals:   05/28/18 1530 05/28/18 1537  BP: 91/67 91/67  Pulse: (!) 119 (!) 118  Resp: (!) 40 (!) 25  Temp:    SpO2: 100% 100%   EYES: anicteric ENMT: no thrush Cardiovascular: Cor RRR Respiratory: CTA B, no wheezes noted; some mild increase in respiratory effort GI: Bowel sounds are normal, liver is not enlarged, spleen is not enlarged Musculoskeletal: no pedal edema noted Skin: negatives: no rash Neuro: non-focal  Lab Results  Component Value Date   WBC 10.0 05/28/2018   HGB 8.6 (L) 05/28/2018   HCT 25.4 (L) 05/28/2018   MCV 99.6 05/28/2018   PLT 68 (L) 05/28/2018    Lab Results  Component Value Date   CREATININE 1.02 (H) 05/28/2018   BUN 16 05/28/2018   NA 132 (L) 05/28/2018   K 4.0 05/28/2018   CL 104 05/28/2018   CO2 16 (L) 05/28/2018    Lab Results  Component Value Date   ALT 31 05/28/2018   AST 44 (H) 05/28/2018   ALKPHOS 180 (H) 05/28/2018     Microbiology: Recent Results (from the past 240 hour(s))  SARS Coronavirus 2 [facility]  order, Performed in [facility] hospital lab)     Status: None   Collection Time: 05/28/18  1:25 PM  Result Value Ref Range Status   SARS Coronavirus 2 NEGATIVE NEGATIVE Final    Comment: (NOTE) If result is NEGATIVE SARS-CoV-2 target nucleic acids are NOT DETECTED. The SARS-CoV-2 RNA is generally detectable in upper and lower  respiratory specimens during the acute phase of infection. The lowest  concentration of SARS-CoV-2 viral copies this assay can detect is 250  copies / mL. A negative result does not preclude SARS-CoV-2 infection  and should not be used as the sole  basis for treatment or other  patient management decisions.  A negative result may occur with  improper specimen collection / handling, submission of specimen other  than nasopharyngeal swab, presence of viral mutation(s) within the  areas targeted by this assay, and inadequate number of viral copies  (<250 copies / mL). A negative result must be combined with clinical  observations, patient history, and epidemiological information. If result is POSITIVE SARS-CoV-2 target nucleic acids are DETECTED. The SARS-CoV-2 RNA is generally detectable in upper and lower  respiratory specimens dur ing the acute phase of infection.  Positive  results are indicative of active infection with SARS-CoV-2.  Clinical  correlation with patient history and other diagnostic information is  necessary to determine patient infection status.  Positive results do  not rule out bacterial infection or co-infection with other viruses. If result is PRESUMPTIVE POSTIVE SARS-CoV-2 nucleic acids MAY BE PRESENT.   A presumptive positive result was obtained on the submitted specimen  and confirmed on repeat testing.  While 2019 novel coronavirus  (SARS-CoV-2) nucleic acids may be present in the submitted sample  additional confirmatory testing may be necessary for epidemiological  and / or clinical management purposes  to differentiate between  SARS-CoV-2 and other Sarbecovirus currently known to infect humans.  If clinically indicated additional testing with an alternate test  methodology [phone]) is advised. The SARS-CoV-2 RNA is generally  detectable in upper and lower respiratory sp ecimens during the acute  phase of infection. The expected result is Negative. Fact Sheet for Patients:  StrictlyIdeas.no Fact Sheet for Healthcare Providers: BankingDealers.co.za This test is not yet approved or cleared by the [place] FDA and has been authorized for detection  and/or diagnosis of SARS-CoV-2 by FDA under an Emergency Use Authorization (EUA).  This EUA will remain in effect (meaning this test can be used) for the duration of the COVID-19 declaration under Section 564(b)(1) of the Act, 21 U.S.C. section 360bbb-3(b)(1), unless the authorization is terminated or revoked sooner. Performed at [facility], [place] [address] [place], [place] [address]      W , MD [facility] [email or web address] 05/28/2018, 4:45 PM

## 2018-05-28 NOTE — ED Notes (Signed)
ED Provider at bedside. 

## 2018-05-28 NOTE — Assessment & Plan Note (Signed)
Krystal Short has shortness of breath with cough and fever concerning for pneumonia. Risk factors do not appear consistent with Coronavirus/Covid-19 however cannot be ruled out given her symptoms. Testing for Covid-19 performed with results pending. She is in need of further work up as I am concerned her symptoms are consistent with SIRS/SEPSIS. I have attempted to directly admit her with the hospitalist providing the recommendation to go directly to the ED. I have informed Krystal Short of the plan to go to [facility] as she is being ruled out for coronavirus and informed Dr. Linus Salmons who is the ID provider on call regarding her situation.

## 2018-05-29 ENCOUNTER — Telehealth: Payer: Self-pay | Admitting: Acute Care

## 2018-05-29 DIAGNOSIS — D696 Thrombocytopenia, unspecified: Secondary | ICD-10-CM

## 2018-05-29 DIAGNOSIS — R0902 Hypoxemia: Secondary | ICD-10-CM

## 2018-05-29 DIAGNOSIS — E871 Hypo-osmolality and hyponatremia: Secondary | ICD-10-CM

## 2018-05-29 LAB — BASIC METABOLIC PANEL
Anion gap: 8 (ref 5–15)
BUN: 11 mg/dL (ref 6–20)
CO2: 15 mmol/L — ABNORMAL LOW (ref 22–32)
Calcium: 7.4 mg/dL — ABNORMAL LOW (ref 8.9–10.3)
Chloride: 110 mmol/L (ref 98–111)
Creatinine, Ser: 0.87 mg/dL (ref 0.44–1.00)
GFR calc Af Amer: >60 mL/min (ref >60)
GFR calc non Af Amer: >60 mL/min (ref >60)
Glucose, Bld: 126 mg/dL — ABNORMAL HIGH (ref 70–99)
Potassium: 3.7 mmol/L (ref 3.5–5.1)
Sodium: 133 mmol/L — ABNORMAL LOW (ref 135–145)

## 2018-05-29 LAB — LACTIC ACID, PLASMA: Lactic Acid, Venous: 1.7 mmol/L (ref 0.5–1.9)

## 2018-05-29 LAB — OSMOLALITY: Osmolality: 281 mOsm/kg (ref 275–295)

## 2018-05-29 LAB — CBC
HCT: 24.3 % — ABNORMAL LOW (ref 36.0–46.0)
Hemoglobin: 8.2 g/dL — ABNORMAL LOW (ref 12.0–15.0)
MCH: 32.9 pg (ref 26.0–34.0)
MCHC: 33.7 g/dL (ref 30.0–36.0)
MCV: 97.6 fL (ref 80.0–100.0)
Platelets: 63 10*3/uL — ABNORMAL LOW (ref 150–400)
RBC: 2.49 MIL/uL — ABNORMAL LOW (ref 3.87–5.11)
RDW: 15 % (ref 11.5–15.5)
WBC: 13 10*3/uL — ABNORMAL HIGH (ref 4.0–10.5)
nRBC: 0 % (ref 0.0–0.2)

## 2018-05-29 MED ORDER — SODIUM CHLORIDE 0.9 % IV SOLN
INTRAVENOUS | Status: DC
  Administered 2018-05-29 – 2018-05-30 (×2): via INTRAVENOUS

## 2018-05-29 MED ORDER — MAGNESIUM SULFATE 2 GM/50ML IV SOLN
2.0000 g | Freq: Once | INTRAVENOUS | Status: AC
  Administered 2018-05-29: 2 g via INTRAVENOUS
  Filled 2018-05-29: qty 50

## 2018-05-29 NOTE — Progress Notes (Addendum)
PROGRESS NOTE    Krystal Short  ZES:[id number] DOB: 1986-01-01 DOA: 05/28/2018 PCP: Dianna Rossetti, NP (Inactive)   Brief Narrative: Patient is a 33 year old African-American female with a past medical history of HIV, PCP  who presents to the emergency department with complaints of fever, shortness of breath, cough.  Chest x-ray done on presentation also history of atypical infection.  COVID-19 ruled out.  Possibility of PCP pneumonia.  ID consulted.  CT chest showed enlarged lymph nodes/the possibility of sarcoidosis.  Assessment & Plan:   Active Problems:   Pneumonia   Atypical pneumonia/likely PCP : Respiratory status has improved today.  Patient feels better today.  Started treatment for PCP pneumonia as per ID.  ID was following.  Also started on steroids. Afebrile this morning.  In sinus tachycardia.  Possible sarcoidosis: No history of psychosis in the past.  CT chest showed enlarged mediastinal and bilateral hilar lymph nodes.  Possible sarcoidosis.  No history of sarcoidosis in the past.  I have notified pulmonary team for arranging  outpatient follow-up.  HIV/AIDS: Last CD4 count was 10 about 2 months ago.  Follows with infectious disease.  ID following.  Thrombocytopenia: Most likely associated with HIV medications.  We will continue to monitor.  Hypomagnesemia: Supplemented.  Sinus tachycardia: We will start on gentle IV fluids.         DVT prophylaxis: SCD Code Status: Full Family Communication: None present at the bedside Disposition Plan: Likely home in 1 to 2 days.  Need to be off oxygen.  ID clearance   Consultants: ID  Procedures: None  Antimicrobials:  Anti-infectives (From admission, onward)   Start     Dose/Rate Route Frequency Ordered Stop   05/29/18 1800  azithromycin (ZITHROMAX) 500 mg in sodium chloride 0.9 % 250 mL IVPB     500 mg 250 mL/hr over 60 Minutes Intravenous Every 24 hours 05/28/18 1841 06/03/18 1759   05/29/18 1600  cefTRIAXone  (ROCEPHIN) 1 g in sodium chloride 0.9 % 100 mL IVPB     1 g 200 mL/hr over 30 Minutes Intravenous Every 24 hours 05/28/18 1841 06/05/18 1559   05/28/18 2200  sulfamethoxazole-trimethoprim (BACTRIM DS) 800-160 MG per tablet 2 tablet  Status:  Discontinued     2 tablet Oral Every 8 hours 05/28/18 1841 05/28/18 1856   05/28/18 2000  bictegravir-emtricitabine-tenofovir AF (BIKTARVY) 50-200-25 MG per tablet 1 tablet     1 tablet Oral Daily 05/28/18 1841     05/28/18 1800  sulfamethoxazole-trimethoprim (BACTRIM) 320 mg in dextrose 5 % 500 mL IVPB     320 mg 346.7 mL/hr over 90 Minutes Intravenous Every 8 hours 05/28/18 1712     05/28/18 1430  cefTRIAXone (ROCEPHIN) 1 g in sodium chloride 0.9 % 100 mL IVPB     1 g 200 mL/hr over 30 Minutes Intravenous  Once 05/28/18 1421 05/28/18 1603   05/28/18 1430  azithromycin (ZITHROMAX) 500 mg in sodium chloride 0.9 % 250 mL IVPB     500 mg 250 mL/hr over 60 Minutes Intravenous  Once 05/28/18 1421 05/28/18 1943      Subjective: Patient seen and examined at bedside this morning.  Feels comfortable this morning.  In sinus tachycardia.  Denies any shortness of breath or chest pain.  Afebrile.  Objective: Vitals:   05/28/18 2000 05/28/18 2320 05/29/18 0400 05/29/18 0500  BP:    107/68  Pulse:    (!) 102  Resp:    (!) 34  Temp:  98.9 F (37.2 C)  97.6 F (36.4 C)   TempSrc:  Oral Oral   SpO2: 98%   99%  Weight:    58.3 kg  Height:        Intake/Output Summary (Last 24 hours) at 05/29/2018 0725 Last data filed at 05/29/2018 0400 Gross per 24 hour  Intake 1757.97 ml  Output -  Net 1757.97 ml   Filed Weights   05/28/18 1311 05/29/18 0500  Weight: 56.7 kg 58.3 kg    Examination:  General exam: Appears calm and comfortable ,Not in distress,average built HEENT:PERRL,Oral mucosa moist, Ear/Nose normal on gross exam Respiratory system: Bilateral equal air entry, normal vesicular breath sounds, no wheezes or crackles  Cardiovascular system: Sinu  tacycardia.S1 & S2 heard, RRR. No JVD, murmurs, rubs, gallops or clicks. No pedal edema. Gastrointestinal system: Abdomen is nondistended, soft and nontender. No organomegaly or masses felt. Normal bowel sounds heard. Central nervous system: Alert and oriented. No focal neurological deficits. Extremities: No edema, no clubbing ,no cyanosis, distal peripheral pulses palpable. Skin: No rashes, lesions or ulcers,no icterus ,no pallor MSK: Normal muscle bulk,tone ,power Psychiatry: Judgement and insight appear normal. Mood & affect appropriate.     Data Reviewed: I have personally reviewed following labs and imaging studies  CBC: Recent Labs  Lab 05/24/18 0925 05/28/18 1322 05/29/18 0039  WBC 10.5 10.0 13.0*  NEUTROABS  --  8.7*  --   HGB 11.7* 8.6* 8.2*  HCT 32.8* 25.4* 24.3*  MCV 97.3 99.6 97.6  PLT 75* 68* 63*   Basic Metabolic Panel: Recent Labs  Lab 05/24/18 0925 05/28/18 1322 05/28/18 2017 05/29/18 0039  NA 127* 132*  --  133*  K 5.1 4.0  --  3.7  CL 100 104  --  110  CO2 18* 16*  --  15*  GLUCOSE 111* 91  --  126*  BUN 13 16  --  11  CREATININE 1.10* 1.02*  --  0.87  CALCIUM 8.6* 8.2*  --  7.4*  MG  --   --  1.5*  --   PHOS  --   --  3.0  --    GFR: Estimated Creatinine Clearance: 72.5 mL/min (by C-G formula based on SCr of 0.87 mg/dL). Liver Function Tests: Recent Labs  Lab 05/28/18 1322  AST 44*  ALT 31  ALKPHOS 180*  BILITOT 1.2  PROT 8.1  ALBUMIN 2.4*   No results for input(s): LIPASE, AMYLASE in the last 168 hours. No results for input(s): AMMONIA in the last 168 hours. Coagulation Profile: No results for input(s): INR, PROTIME in the last 168 hours. Cardiac Enzymes: Recent Labs  Lab 05/28/18 1322  TROPONINI <0.03   BNP (last 3 results) No results for input(s): PROBNP in the last 8760 hours. HbA1C: No results for input(s): HGBA1C in the last 72 hours. CBG: No results for input(s): GLUCAP in the last 168 hours. Lipid Profile: Recent Labs     05/28/18 1325  TRIG 312*   Thyroid Function Tests: No results for input(s): TSH, T4TOTAL, FREET4, T3FREE, THYROIDAB in the last 72 hours. Anemia Panel: Recent Labs    05/28/18 1325  FERRITIN 1,229*   Sepsis Labs: Recent Labs  Lab 05/28/18 1322 05/28/18 2017 05/29/18 0039  PROCALCITON 3.99  --   --   LATICACIDVEN 2.2* 2.2* 1.7    Recent Results (from the past 240 hour(s))  SARS Coronavirus 2 [facility] order, Performed in [facility] hospital lab)     Status: None   Collection Time: 05/28/18  1:25 PM  Result Value Ref Range Status   SARS Coronavirus 2 NEGATIVE NEGATIVE Final    Comment: (NOTE) If result is NEGATIVE SARS-CoV-2 target nucleic acids are NOT DETECTED. The SARS-CoV-2 RNA is generally detectable in upper and lower  respiratory specimens during the acute phase of infection. The lowest  concentration of SARS-CoV-2 viral copies this assay can detect is 250  copies / mL. A negative result does not preclude SARS-CoV-2 infection  and should not be used as the sole basis for treatment or other  patient management decisions.  A negative result may occur with  improper specimen collection / handling, submission of specimen other  than nasopharyngeal swab, presence of viral mutation(s) within the  areas targeted by this assay, and inadequate number of viral copies  (<250 copies / mL). A negative result must be combined with clinical  observations, patient history, and epidemiological information. If result is POSITIVE SARS-CoV-2 target nucleic acids are DETECTED. The SARS-CoV-2 RNA is generally detectable in upper and lower  respiratory specimens dur ing the acute phase of infection.  Positive  results are indicative of active infection with SARS-CoV-2.  Clinical  correlation with patient history and other diagnostic information is  necessary to determine patient infection status.  Positive results do  not rule out bacterial infection or co-infection with other  viruses. If result is PRESUMPTIVE POSTIVE SARS-CoV-2 nucleic acids MAY BE PRESENT.   A presumptive positive result was obtained on the submitted specimen  and confirmed on repeat testing.  While 2019 novel coronavirus  (SARS-CoV-2) nucleic acids may be present in the submitted sample  additional confirmatory testing may be necessary for epidemiological  and / or clinical management purposes  to differentiate between  SARS-CoV-2 and other Sarbecovirus currently known to infect humans.  If clinically indicated additional testing with an alternate test  methodology [phone]) is advised. The SARS-CoV-2 RNA is generally  detectable in upper and lower respiratory sp ecimens during the acute  phase of infection. The expected result is Negative. Fact Sheet for Patients:  StrictlyIdeas.no Fact Sheet for Healthcare Providers: BankingDealers.co.za This test is not yet approved or cleared by the [place] FDA and has been authorized for detection and/or diagnosis of SARS-CoV-2 by FDA under an Emergency Use Authorization (EUA).  This EUA will remain in effect (meaning this test can be used) for the duration of the COVID-19 declaration under Section 564(b)(1) of the Act, 21 U.S.C. section 360bbb-3(b)(1), unless the authorization is terminated or revoked sooner. Performed at [facility], [place] [address] [place], [place] [address]   Respiratory Panel by PCR     Status: None   Collection Time: 05/28/18  1:26 PM  Result Value Ref Range Status   Adenovirus NOT DETECTED NOT DETECTED Final   Coronavirus 229E NOT DETECTED NOT DETECTED Final    Comment: (NOTE) The Coronavirus on the Respiratory Panel, DOES NOT test for the novel  Coronavirus (2019 nCoV)    Coronavirus HKU1 NOT DETECTED NOT DETECTED Final   Coronavirus NL63 NOT DETECTED NOT DETECTED Final   Coronavirus OC43 NOT DETECTED NOT DETECTED Final   Metapneumovirus NOT  DETECTED NOT DETECTED Final   Rhinovirus / Enterovirus NOT DETECTED NOT DETECTED Final   Influenza A NOT DETECTED NOT DETECTED Final   Influenza B NOT DETECTED NOT DETECTED Final   Parainfluenza Virus 1 NOT DETECTED NOT DETECTED Final   Parainfluenza Virus 2 NOT DETECTED NOT DETECTED Final   Parainfluenza Virus 3 NOT DETECTED NOT DETECTED Final   Parainfluenza Virus  4 NOT DETECTED NOT DETECTED Final   Respiratory Syncytial Virus NOT DETECTED NOT DETECTED Final   Bordetella pertussis NOT DETECTED NOT DETECTED Final   Chlamydophila pneumoniae NOT DETECTED NOT DETECTED Final   Mycoplasma pneumoniae NOT DETECTED NOT DETECTED Final    Comment: Performed at [facility], [place] [address], [place], [place] [address]  MRSA PCR Screening     Status: None   Collection Time: 05/28/18  6:48 PM  Result Value Ref Range Status   MRSA by PCR NEGATIVE NEGATIVE Final    Comment:        The GeneXpert MRSA Assay (FDA approved for NASAL specimens only), is one component of a comprehensive MRSA colonization surveillance program. It is not intended to diagnose MRSA infection nor to guide or monitor treatment for MRSA infections. Performed at [facility], [place] [address], [place], [place] [address]   Blood Culture (routine x 2)     Status: None (Preliminary result)   Collection Time: 05/28/18  8:17 PM  Result Value Ref Range Status   Specimen Description   Final    BLOOD RIGHT HAND Performed at [facility] [address] [place], [place] [address]    Special Requests   Final    BOTTLES DRAWN AEROBIC ONLY Blood Culture results may not be optimal due to an inadequate volume of blood received in culture bottles Performed at [facility] [address] [place],  [address]    Culture PENDING  Incomplete   Report Status PENDING  Incomplete         Radiology Studies: Ct Chest Wo Contrast  Result Date: 05/28/2018 CLINICAL DATA:   33 year old female with history of nonproductive cough and body aches with fever and fatigue for the past 5 days. EXAM: CT CHEST WITHOUT CONTRAST TECHNIQUE: Multidetector CT imaging of the chest was performed following the standard protocol without IV contrast. COMPARISON:  Chest CT 06/04/2017. FINDINGS: Cardiovascular: Heart size is normal. There is no significant pericardial fluid, thickening or pericardial calcification. No atherosclerotic calcifications in the thoracic aorta or the coronary arteries. Mediastinum/Nodes: Multiple enlarged mediastinal and bilateral hilar lymph nodes measuring up to 1.8 cm in short axis in the right paratracheal nodal station. Esophagus is unremarkable in appearance. No axillary lymphadenopathy. Lungs/Pleura: Unusual appearance in the lungs with diffuse thickening of the peribronchovascular interstitium and diffuse thickening of the fissures, all of which appears micronodular. Some subpleural thickening is also noted in the periphery of the lungs bilaterally. These findings are diffuse throughout the lungs, with minimal sparing in the extreme lung bases, and most severe involvement throughout the mid to upper lungs. No confluent consolidative airspace disease. No pleural effusions. Upper Abdomen: Unremarkable. Musculoskeletal: There are no aggressive appearing lytic or blastic lesions noted in the visualized portions of the skeleton. IMPRESSION: 1. Unusual appearance of the thorax with a spectrum of findings most suggestive of sarcoidosis, as detailed above. Electronically Signed   By: Vinnie Langton M.D.   On: 05/28/2018 18:05   Dg Chest Portable 1 View  Result Date: 05/28/2018 CLINICAL DATA:  Cough, fever. EXAM: PORTABLE CHEST 1 VIEW COMPARISON:  Radiographs of March 08, 2018. FINDINGS: The heart size and mediastinal contours are within normal limits. No pneumothorax or pleural effusion is noted. Increased diffuse reticular densities are noted throughout both lungs,  concerning for atypical infection or pneumonia. The visualized skeletal structures are unremarkable. IMPRESSION: Increased bilateral diffuse reticular densities are noted concerning for atypical infection or pneumonia. Electronically  Signed   By: Marijo Conception M.D.   On: 05/28/2018 14:43        Scheduled Meds: . bictegravir-emtricitabine-tenofovir AF  1 tablet Oral Daily  . Chlorhexidine Gluconate Cloth  6 each Topical Daily  . mouth rinse  15 mL Mouth Rinse BID  . [START ON 06/03/2018] predniSONE  40 mg Oral Q breakfast   And  . [START ON 06/08/2018] predniSONE  20 mg Oral Q breakfast  . predniSONE  40 mg Oral BID  . sodium chloride flush  3 mL Intravenous Q12H   Continuous Infusions: . sodium chloride    . azithromycin    . cefTRIAXone (ROCEPHIN)  IV    . sulfamethoxazole-trimethoprim Stopped (05/29/18 0704)     LOS: 1 day    Time spent: 35 mins.More than 50% of that time was spent in counseling and/or coordination of care.      Shelly Coss, MD [facility] Pager [phone]  If 7PM-7AM, please contact night-coverage [email or web address] Password [facility] 05/29/2018, 7:25 AM

## 2018-05-29 NOTE — Progress Notes (Signed)
Pt complains of unrelieved back pain. Repositioning and heat packs did not help. [facility] paged again. No PRNs or any pain medications ordered at this time.  Will continue to monitor patient.  Mariann Laster RN

## 2018-05-29 NOTE — Telephone Encounter (Signed)
Received below staff message from Marni Griffon, NP: Erick Colace, NP  P [facility] Triage [facility] team   Can you get this woman on schedule for new consult. 6-8 weeks down the road w/ any MD   Reason for consult  Lymphadenopathy ? Sarcoid  Consulting MD: Krystal Short w/ [facility]   Please put it in computer   Thanks   Krystal Short    Pt is currently still admitted in the Short but per Krystal Short, schedule the appt and they will see it. Pt has been scheduled for consult with Krystal Short Friday,  July 10 at 9:45. Called [facility] and spoke with Krystal Soho, RN letting her know that the appt is scheduled for pt's consult and that it is avail in pt's epic chart. Krystal Short expressed understanding. Nothing further needed.

## 2018-05-29 NOTE — Progress Notes (Signed)
[place] for Infectious Disease   Reason for visit: Follow up on hypoxia  Interval History: CT noted and concern for sarcoid.  Now off of O2.  Afebrile about 24 hours.  WBC up to 13, on prednisone. Feels better overall.     Physical Exam: Constitutional:  Vitals:   05/29/18 1100 05/29/18 1200  BP: 107/61 107/70  Pulse: (!) 111 (!) 116  Resp: (!) 32 (!) 35  Temp:    SpO2: 94% 98%   patient appears in NAD Eyes: anicteric HENT: no thrush Respiratory: Normal respiratory effort; CTA B Cardiovascular: RRR   Review of Systems: Constitutional: negative for chills and anorexia Gastrointestinal: negative for nausea and diarrhea Integument/breast: negative for new rash  Lab Results  Component Value Date   WBC 13.0 (H) 05/29/2018   HGB 8.2 (L) 05/29/2018   HCT 24.3 (L) 05/29/2018   MCV 97.6 05/29/2018   PLT 63 (L) 05/29/2018    Lab Results  Component Value Date   CREATININE 0.87 05/29/2018   BUN 11 05/29/2018   NA 133 (L) 05/29/2018   K 3.7 05/29/2018   CL 110 05/29/2018   CO2 15 (L) 05/29/2018    Lab Results  Component Value Date   ALT 31 05/28/2018   AST 44 (H) 05/28/2018   ALKPHOS 180 (H) 05/28/2018     Microbiology: Recent Results (from the past 240 hour(s))  SARS Coronavirus 2 [facility] order, Performed in [facility] hospital lab)     Status: None   Collection Time: 05/28/18  1:25 PM  Result Value Ref Range Status   SARS Coronavirus 2 NEGATIVE NEGATIVE Final    Comment: (NOTE) If result is NEGATIVE SARS-CoV-2 target nucleic acids are NOT DETECTED. The SARS-CoV-2 RNA is generally detectable in upper and lower  respiratory specimens during the acute phase of infection. The lowest  concentration of SARS-CoV-2 viral copies this assay can detect is 250  copies / mL. A negative result does not preclude SARS-CoV-2 infection  and should not be used as the sole basis for treatment or other  patient management decisions.  A negative result may occur with   improper specimen collection / handling, submission of specimen other  than nasopharyngeal swab, presence of viral mutation(s) within the  areas targeted by this assay, and inadequate number of viral copies  (<250 copies / mL). A negative result must be combined with clinical  observations, patient history, and epidemiological information. If result is POSITIVE SARS-CoV-2 target nucleic acids are DETECTED. The SARS-CoV-2 RNA is generally detectable in upper and lower  respiratory specimens dur ing the acute phase of infection.  Positive  results are indicative of active infection with SARS-CoV-2.  Clinical  correlation with patient history and other diagnostic information is  necessary to determine patient infection status.  Positive results do  not rule out bacterial infection or co-infection with other viruses. If result is PRESUMPTIVE POSTIVE SARS-CoV-2 nucleic acids MAY BE PRESENT.   A presumptive positive result was obtained on the submitted specimen  and confirmed on repeat testing.  While 2019 novel coronavirus  (SARS-CoV-2) nucleic acids may be present in the submitted sample  additional confirmatory testing may be necessary for epidemiological  and / or clinical management purposes  to differentiate between  SARS-CoV-2 and other Sarbecovirus currently known to infect humans.  If clinically indicated additional testing with an alternate test  methodology [phone]) is advised. The SARS-CoV-2 RNA is generally  detectable in upper and lower respiratory sp ecimens during the  acute  phase of infection. The expected result is Negative. Fact Sheet for Patients:  StrictlyIdeas.no Fact Sheet for Healthcare Providers: BankingDealers.co.za This test is not yet approved or cleared by the [place] FDA and has been authorized for detection and/or diagnosis of SARS-CoV-2 by FDA under an Emergency Use Authorization (EUA).  This EUA will  remain in effect (meaning this test can be used) for the duration of the COVID-19 declaration under Section 564(b)(1) of the Act, 21 U.S.C. section 360bbb-3(b)(1), unless the authorization is terminated or revoked sooner. Performed at [facility], [place] [address], [place], [place] [address]   Respiratory Panel by PCR     Status: None   Collection Time: 05/28/18  1:26 PM  Result Value Ref Range Status   Adenovirus NOT DETECTED NOT DETECTED Final   Coronavirus 229E NOT DETECTED NOT DETECTED Final    Comment: (NOTE) The Coronavirus on the Respiratory Panel, DOES NOT test for the novel  Coronavirus (2019 nCoV)    Coronavirus HKU1 NOT DETECTED NOT DETECTED Final   Coronavirus NL63 NOT DETECTED NOT DETECTED Final   Coronavirus OC43 NOT DETECTED NOT DETECTED Final   Metapneumovirus NOT DETECTED NOT DETECTED Final   Rhinovirus / Enterovirus NOT DETECTED NOT DETECTED Final   Influenza A NOT DETECTED NOT DETECTED Final   Influenza B NOT DETECTED NOT DETECTED Final   Parainfluenza Virus 1 NOT DETECTED NOT DETECTED Final   Parainfluenza Virus 2 NOT DETECTED NOT DETECTED Final   Parainfluenza Virus 3 NOT DETECTED NOT DETECTED Final   Parainfluenza Virus 4 NOT DETECTED NOT DETECTED Final   Respiratory Syncytial Virus NOT DETECTED NOT DETECTED Final   Bordetella pertussis NOT DETECTED NOT DETECTED Final   Chlamydophila pneumoniae NOT DETECTED NOT DETECTED Final   Mycoplasma pneumoniae NOT DETECTED NOT DETECTED Final    Comment: Performed at [facility], [place]. [address], [place], [place] [address]  MRSA PCR Screening     Status: None   Collection Time: 05/28/18  6:48 PM  Result Value Ref Range Status   MRSA by PCR NEGATIVE NEGATIVE Final    Comment:        The GeneXpert MRSA Assay (FDA approved for NASAL specimens only), is one component of a comprehensive MRSA colonization surveillance program. It is not intended to diagnose MRSA infection nor to guide or  monitor treatment for MRSA infections. Performed at [facility], [place] [address], [place], [place] [address]   Blood Culture (routine x 2)     Status: None (Preliminary result)   Collection Time: 05/28/18  8:17 PM  Result Value Ref Range Status   Specimen Description   Final    BLOOD RIGHT HAND Performed at [facility] [address] [place], [place] [address]    Special Requests   Final    BOTTLES DRAWN AEROBIC ONLY Blood Culture results may not be optimal due to an inadequate volume of blood received in culture bottles Performed at [facility] [address] [place],  [address]    Culture PENDING  Incomplete   Report Status PENDING  Incomplete    Impression/Plan:  1. Possible PJP pneumonia - hypoxia, now improved, CT findings.  Concern for PJP.  On treatment including steroids.  Will check PJP DFA.  If positive, would favor this over sarcoid.  If negative though, would be unlcear.  She will need 21 days of treatment for PJP.  She was though on Bactrim prophylaxis so still make  PJP a bit less likely.  Has follow up with pulmonary arranged.  Follow up CXR in a month may be helpful.   2.  HIV - continue on Biktarvy.  No changes.  3.  Thrombocytopenia - persistent in the last week.  Not worsening.  HIT panel in process.    4. Hyponatremia - improved.

## 2018-05-30 DIAGNOSIS — B59 Pneumocystosis: Secondary | ICD-10-CM | POA: Diagnosis not present

## 2018-05-30 DIAGNOSIS — D649 Anemia, unspecified: Secondary | ICD-10-CM

## 2018-05-30 DIAGNOSIS — R0602 Shortness of breath: Secondary | ICD-10-CM | POA: Diagnosis not present

## 2018-05-30 DIAGNOSIS — E871 Hypo-osmolality and hyponatremia: Secondary | ICD-10-CM | POA: Diagnosis not present

## 2018-05-30 DIAGNOSIS — R05 Cough: Secondary | ICD-10-CM | POA: Diagnosis not present

## 2018-05-30 DIAGNOSIS — Z87891 Personal history of nicotine dependence: Secondary | ICD-10-CM | POA: Diagnosis not present

## 2018-05-30 DIAGNOSIS — R918 Other nonspecific abnormal finding of lung field: Secondary | ICD-10-CM

## 2018-05-30 DIAGNOSIS — Z20828 Contact with and (suspected) exposure to other viral communicable diseases: Secondary | ICD-10-CM | POA: Diagnosis not present

## 2018-05-30 DIAGNOSIS — R509 Fever, unspecified: Secondary | ICD-10-CM | POA: Diagnosis not present

## 2018-05-30 DIAGNOSIS — B20 Human immunodeficiency virus [HIV] disease: Secondary | ICD-10-CM | POA: Diagnosis not present

## 2018-05-30 DIAGNOSIS — R590 Localized enlarged lymph nodes: Secondary | ICD-10-CM

## 2018-05-30 DIAGNOSIS — R0902 Hypoxemia: Secondary | ICD-10-CM | POA: Diagnosis not present

## 2018-05-30 LAB — CBC WITH DIFFERENTIAL/PLATELET
Band Neutrophils: 1 %
Basophils Absolute: 0 10*3/uL (ref 0.0–0.1)
Basophils Relative: 0 %
Blasts: 0 %
Eosinophils Absolute: 0 10*3/uL (ref 0.0–0.5)
Eosinophils Relative: 0 %
HCT: 21.9 % — ABNORMAL LOW (ref 36.0–46.0)
Hemoglobin: 7.1 g/dL — ABNORMAL LOW (ref 12.0–15.0)
Lymphocytes Relative: 9 %
Lymphs Abs: 1.2 10*3/uL (ref 0.7–4.0)
MCH: 32.6 pg (ref 26.0–34.0)
MCHC: 32.4 g/dL (ref 30.0–36.0)
MCV: 100.5 fL — ABNORMAL HIGH (ref 80.0–100.0)
Metamyelocytes Relative: 0 %
Monocytes Absolute: 0.6 10*3/uL (ref 0.1–1.0)
Monocytes Relative: 5 %
Myelocytes: 0 %
Neutro Abs: 11.5 10*3/uL — ABNORMAL HIGH (ref 1.7–7.7)
Neutrophils Relative %: 85 %
Other: 0 %
Platelets: 56 10*3/uL — ABNORMAL LOW (ref 150–400)
Promyelocytes Relative: 0 %
RBC: 2.18 MIL/uL — ABNORMAL LOW (ref 3.87–5.11)
RDW: 15.5 % (ref 11.5–15.5)
WBC: 13.1 10*3/uL — ABNORMAL HIGH (ref 4.0–10.5)
nRBC: 0 % (ref 0.0–0.2)
nRBC: 0 /100 WBC

## 2018-05-30 LAB — BASIC METABOLIC PANEL
Anion gap: 7 (ref 5–15)
BUN: 9 mg/dL (ref 6–20)
CO2: 16 mmol/L — ABNORMAL LOW (ref 22–32)
Calcium: 7.7 mg/dL — ABNORMAL LOW (ref 8.9–10.3)
Chloride: 111 mmol/L (ref 98–111)
Creatinine, Ser: 0.69 mg/dL (ref 0.44–1.00)
GFR calc Af Amer: >60 mL/min (ref >60)
GFR calc non Af Amer: >60 mL/min (ref >60)
Glucose, Bld: 125 mg/dL — ABNORMAL HIGH (ref 70–99)
Potassium: 3.7 mmol/L (ref 3.5–5.1)
Sodium: 134 mmol/L — ABNORMAL LOW (ref 135–145)

## 2018-05-30 LAB — URINALYSIS, ROUTINE W REFLEX MICROSCOPIC
Bilirubin Urine: NEGATIVE
Glucose, UA: NEGATIVE mg/dL
Hgb urine dipstick: NEGATIVE
Ketones, ur: NEGATIVE mg/dL
Leukocytes,Ua: NEGATIVE
Nitrite: NEGATIVE
Protein, ur: NEGATIVE mg/dL
Specific Gravity, Urine: 1.014 (ref 1.005–1.030)
pH: 6 (ref 5.0–8.0)

## 2018-05-30 LAB — IRON AND TIBC
Iron: 34 ug/dL (ref 28–170)
Saturation Ratios: 14 % (ref 10.4–31.8)
TIBC: 238 ug/dL — ABNORMAL LOW (ref 250–450)
UIBC: 204 ug/dL

## 2018-05-30 LAB — OSMOLALITY, URINE: Osmolality, Ur: 385 mOsm/kg (ref 300–900)

## 2018-05-30 LAB — FERRITIN: Ferritin: 603 ng/mL — ABNORMAL HIGH (ref 11–307)

## 2018-05-30 LAB — STREP PNEUMONIAE URINARY ANTIGEN: Strep Pneumo Urinary Antigen: NEGATIVE

## 2018-05-30 LAB — MAGNESIUM: Magnesium: 2.2 mg/dL (ref 1.7–2.4)

## 2018-05-30 LAB — SODIUM, URINE, RANDOM: Sodium, Ur: 66 mmol/L

## 2018-05-30 MED ORDER — ENSURE ENLIVE PO LIQD
237.0000 mL | Freq: Two times a day (BID) | ORAL | Status: DC
  Administered 2018-05-30 – 2018-05-31 (×2): 237 mL via ORAL

## 2018-05-30 MED ORDER — SULFAMETHOXAZOLE-TRIMETHOPRIM 800-160 MG PO TABS
2.0000 | ORAL_TABLET | Freq: Three times a day (TID) | ORAL | Status: DC
  Administered 2018-05-30 – 2018-05-31 (×3): 2 via ORAL
  Filled 2018-05-30 (×3): qty 2

## 2018-05-30 NOTE — Plan of Care (Signed)
  Problem: Clinical Measurements: Goal: Will remain free from infection Outcome: Progressing Goal: Diagnostic test results will improve Outcome: Progressing Goal: Respiratory complications will improve Outcome: Progressing   Problem: Nutrition: Goal: Adequate nutrition will be maintained Outcome: Not Progressing   Problem: Education: Goal: Knowledge of General Education information will improve Description Including pain rating scale, medication(s)/side effects and non-pharmacologic comfort measures Outcome: Adequate for Discharge   Problem: Health Behavior/Discharge Planning: Goal: Ability to manage health-related needs will improve Outcome: Adequate for Discharge   Problem: Clinical Measurements: Goal: Ability to maintain clinical measurements within normal limits will improve Outcome: Adequate for Discharge Goal: Cardiovascular complication will be avoided Outcome: Adequate for Discharge   Problem: Activity: Goal: Risk for activity intolerance will decrease Outcome: Adequate for Discharge   Problem: Elimination: Goal: Will not experience complications related to bowel motility Outcome: Adequate for Discharge Goal: Will not experience complications related to urinary retention Outcome: Adequate for Discharge   Problem: Pain Managment: Goal: General experience of comfort will improve Outcome: Adequate for Discharge   Problem: Safety: Goal: Ability to remain free from injury will improve Outcome: Adequate for Discharge   Problem: Skin Integrity: Goal: Risk for impaired skin integrity will decrease Outcome: Adequate for Discharge

## 2018-05-30 NOTE — Progress Notes (Addendum)
PROGRESS NOTE    Krystal Short  OMB:[id number] DOB: 02-19-1985 DOA: 05/28/2018 PCP: Dianna Rossetti, NP (Inactive)   Brief Narrative: Patient is a 33 year old African-American female with a past medical history of HIV, PCP  who presents to the emergency department with complaints of fever, shortness of breath, cough.  Chest x-ray done on presentation suggestive of atypical infection.  COVID-19 ruled out.  Possibility of PCP pneumonia.  ID consulted.  CT chest showed enlarged lymph nodes.  Assessment & Plan:   Active Problems:   Pneumonia   Atypical pneumonia/likely PCP : Respiratory status remains stable.  Continue on room air.  No complaints of shortness of breath or cough.   Started treatment for PC pneumonia as per ID. On bactrim. ID has been following.  Also started on steroids. Afebrile this morning.   Possible sarcoidosis: No history of sarcoidosis in the past.  CT chest showed enlarged mediastinal and bilateral hilar lymph nodes.  Low possibility of sarcoidosis. I have notified pulmonary team for arranging  outpatient follow-up.  HIV/AIDS: Last CD4 count was 10 about 2 months ago.  Follows with infectious disease.  ID following.  Thrombocytopenia: Most likely associated with HIV medications.  We will continue to monitor.  Hypomagnesemia: Supplemented.  Sinus tachycardia: Much improved.         DVT prophylaxis: SCD Code Status: Full Family Communication: None present at the bedside Disposition Plan: Home tomorrow  Consultants: ID  Procedures: None  Antimicrobials:  Anti-infectives (From admission, onward)   Start     Dose/Rate Route Frequency Ordered Stop   05/29/18 1800  azithromycin (ZITHROMAX) 500 mg in sodium chloride 0.9 % 250 mL IVPB  Status:  Discontinued     500 mg 250 mL/hr over 60 Minutes Intravenous Every 24 hours 05/28/18 1841 05/29/18 1037   05/29/18 1600  cefTRIAXone (ROCEPHIN) 1 g in sodium chloride 0.9 % 100 mL IVPB  Status:  Discontinued     1 g  200 mL/hr over 30 Minutes Intravenous Every 24 hours 05/28/18 1841 05/29/18 1037   05/28/18 2200  sulfamethoxazole-trimethoprim (BACTRIM DS) 800-160 MG per tablet 2 tablet  Status:  Discontinued     2 tablet Oral Every 8 hours 05/28/18 1841 05/28/18 1856   05/28/18 2000  bictegravir-emtricitabine-tenofovir AF (BIKTARVY) 50-200-25 MG per tablet 1 tablet     1 tablet Oral Daily 05/28/18 1841     05/28/18 1800  sulfamethoxazole-trimethoprim (BACTRIM) 320 mg in dextrose 5 % 500 mL IVPB     320 mg 346.7 mL/hr over 90 Minutes Intravenous Every 8 hours 05/28/18 1712     05/28/18 1430  cefTRIAXone (ROCEPHIN) 1 g in sodium chloride 0.9 % 100 mL IVPB     1 g 200 mL/hr over 30 Minutes Intravenous  Once 05/28/18 1421 05/28/18 1603   05/28/18 1430  azithromycin (ZITHROMAX) 500 mg in sodium chloride 0.9 % 250 mL IVPB     500 mg 250 mL/hr over 60 Minutes Intravenous  Once 05/28/18 1421 05/28/18 1943      Subjective: Patient seen and examined at bedside this morning.  Hemodynamically stable.  Currently on room air.  Denies any chest pain or shortness of breath.  Feels very tired and states she wants to stay 1 more day. Objective: Vitals:   05/30/18 0200 05/30/18 0300 05/30/18 0400 05/30/18 0622  BP: (!) 95/54 104/65 115/71 107/74  Pulse:   97 (!) 102  Resp: (!) 29 (!) 29 (!) 27 17  Temp:   98.2 F (36.8 C) 98.1 F (  36.7 C)  TempSrc:   Oral Oral  SpO2:   96% 98%  Weight:      Height:        Intake/Output Summary (Last 24 hours) at 05/30/2018 1217 Last data filed at 05/30/2018 1207 Gross per 24 hour  Intake 754.32 ml  Output 225 ml  Net 529.32 ml   Filed Weights   05/28/18 1311 05/29/18 0500  Weight: 56.7 kg 58.3 kg    Examination:  General exam: Appears calm and comfortable ,Not in distress,average built HEENT:PERRL,Oral mucosa moist, Ear/Nose normal on gross exam Respiratory system: Bilateral equal air entry, normal vesicular breath sounds, no wheezes or crackles  Cardiovascular  system: S1 & S2 heard, RRR. No JVD, murmurs, rubs, gallops or clicks. Gastrointestinal system: Abdomen is nondistended, soft and nontender. No organomegaly or masses felt. Normal bowel sounds heard. Central nervous system: Alert and oriented. No focal neurological deficits. Extremities: No edema, no clubbing ,no cyanosis, distal peripheral pulses palpable. Skin: No rashes, lesions or ulcers,no icterus ,no pallor MSK: Normal muscle bulk,tone ,power Psychiatry: Judgement and insight appear normal. Mood & affect appropriate.      Data Reviewed: I have personally reviewed following labs and imaging studies  CBC: Recent Labs  Lab 05/24/18 0925 05/28/18 1322 05/29/18 0039 05/30/18 0253  WBC 10.5 10.0 13.0* 13.1*  NEUTROABS  --  8.7*  --  11.5*  HGB 11.7* 8.6* 8.2* 7.1*  HCT 32.8* 25.4* 24.3* 21.9*  MCV 97.3 99.6 97.6 100.5*  PLT 75* 68* 63* 56*   Basic Metabolic Panel: Recent Labs  Lab 05/24/18 0925 05/28/18 1322 05/28/18 2017 05/29/18 0039 05/30/18 0253  NA 127* 132*  --  133* 134*  K 5.1 4.0  --  3.7 3.7  CL 100 104  --  110 111  CO2 18* 16*  --  15* 16*  GLUCOSE 111* 91  --  126* 125*  BUN 13 16  --  11 9  CREATININE 1.10* 1.02*  --  0.87 0.69  CALCIUM 8.6* 8.2*  --  7.4* 7.7*  MG  --   --  1.5*  --  2.2  PHOS  --   --  3.0  --   --    GFR: Estimated Creatinine Clearance: 78.8 mL/min (by C-G formula based on SCr of 0.69 mg/dL). Liver Function Tests: Recent Labs  Lab 05/28/18 1322  AST 44*  ALT 31  ALKPHOS 180*  BILITOT 1.2  PROT 8.1  ALBUMIN 2.4*   No results for input(s): LIPASE, AMYLASE in the last 168 hours. No results for input(s): AMMONIA in the last 168 hours. Coagulation Profile: No results for input(s): INR, PROTIME in the last 168 hours. Cardiac Enzymes: Recent Labs  Lab 05/28/18 1322  TROPONINI <0.03   BNP (last 3 results) No results for input(s): PROBNP in the last 8760 hours. HbA1C: No results for input(s): HGBA1C in the last 72 hours.  CBG: No results for input(s): GLUCAP in the last 168 hours. Lipid Profile: Recent Labs    05/28/18 1325  TRIG 312*   Thyroid Function Tests: No results for input(s): TSH, T4TOTAL, FREET4, T3FREE, THYROIDAB in the last 72 hours. Anemia Panel: Recent Labs    05/28/18 1325 05/30/18 0904  FERRITIN 1,229* 603*  TIBC  --  238*  IRON  --  34   Sepsis Labs: Recent Labs  Lab 05/28/18 1322 05/28/18 2017 05/29/18 0039  PROCALCITON 3.99  --   --   LATICACIDVEN 2.2* 2.2* 1.7    Recent Results (  from the past 240 hour(s))  SARS Coronavirus 2 [facility] order, Performed in [facility] hospital lab)     Status: None   Collection Time: 05/28/18  1:25 PM  Result Value Ref Range Status   SARS Coronavirus 2 NEGATIVE NEGATIVE Final    Comment: (NOTE) If result is NEGATIVE SARS-CoV-2 target nucleic acids are NOT DETECTED. The SARS-CoV-2 RNA is generally detectable in upper and lower  respiratory specimens during the acute phase of infection. The lowest  concentration of SARS-CoV-2 viral copies this assay can detect is 250  copies / mL. A negative result does not preclude SARS-CoV-2 infection  and should not be used as the sole basis for treatment or other  patient management decisions.  A negative result may occur with  improper specimen collection / handling, submission of specimen other  than nasopharyngeal swab, presence of viral mutation(s) within the  areas targeted by this assay, and inadequate number of viral copies  (<250 copies / mL). A negative result must be combined with clinical  observations, patient history, and epidemiological information. If result is POSITIVE SARS-CoV-2 target nucleic acids are DETECTED. The SARS-CoV-2 RNA is generally detectable in upper and lower  respiratory specimens dur ing the acute phase of infection.  Positive  results are indicative of active infection with SARS-CoV-2.  Clinical  correlation with patient history and other diagnostic  information is  necessary to determine patient infection status.  Positive results do  not rule out bacterial infection or co-infection with other viruses. If result is PRESUMPTIVE POSTIVE SARS-CoV-2 nucleic acids MAY BE PRESENT.   A presumptive positive result was obtained on the submitted specimen  and confirmed on repeat testing.  While 2019 novel coronavirus  (SARS-CoV-2) nucleic acids may be present in the submitted sample  additional confirmatory testing may be necessary for epidemiological  and / or clinical management purposes  to differentiate between  SARS-CoV-2 and other Sarbecovirus currently known to infect humans.  If clinically indicated additional testing with an alternate test  methodology [phone]) is advised. The SARS-CoV-2 RNA is generally  detectable in upper and lower respiratory sp ecimens during the acute  phase of infection. The expected result is Negative. Fact Sheet for Patients:  StrictlyIdeas.no Fact Sheet for Healthcare Providers: BankingDealers.co.za This test is not yet approved or cleared by the [place] FDA and has been authorized for detection and/or diagnosis of SARS-CoV-2 by FDA under an Emergency Use Authorization (EUA).  This EUA will remain in effect (meaning this test can be used) for the duration of the COVID-19 declaration under Section 564(b)(1) of the Act, 21 U.S.C. section 360bbb-3(b)(1), unless the authorization is terminated or revoked sooner. Performed at [facility], [place] [address] [place], [place] [address]   Respiratory Panel by PCR     Status: None   Collection Time: 05/28/18  1:26 PM  Result Value Ref Range Status   Adenovirus NOT DETECTED NOT DETECTED Final   Coronavirus 229E NOT DETECTED NOT DETECTED Final    Comment: (NOTE) The Coronavirus on the Respiratory Panel, DOES NOT test for the novel  Coronavirus (2019 nCoV)    Coronavirus HKU1 NOT DETECTED  NOT DETECTED Final   Coronavirus NL63 NOT DETECTED NOT DETECTED Final   Coronavirus OC43 NOT DETECTED NOT DETECTED Final   Metapneumovirus NOT DETECTED NOT DETECTED Final   Rhinovirus / Enterovirus NOT DETECTED NOT DETECTED Final   Influenza A NOT DETECTED NOT DETECTED Final   Influenza B NOT DETECTED NOT DETECTED Final  Parainfluenza Virus 1 NOT DETECTED NOT DETECTED Final   Parainfluenza Virus 2 NOT DETECTED NOT DETECTED Final   Parainfluenza Virus 3 NOT DETECTED NOT DETECTED Final   Parainfluenza Virus 4 NOT DETECTED NOT DETECTED Final   Respiratory Syncytial Virus NOT DETECTED NOT DETECTED Final   Bordetella pertussis NOT DETECTED NOT DETECTED Final   Chlamydophila pneumoniae NOT DETECTED NOT DETECTED Final   Mycoplasma pneumoniae NOT DETECTED NOT DETECTED Final    Comment: Performed at [facility], [place] [address] [place], [place] [address]  MRSA PCR Screening     Status: None   Collection Time: 05/28/18  6:48 PM  Result Value Ref Range Status   MRSA by PCR NEGATIVE NEGATIVE Final    Comment:        The GeneXpert MRSA Assay (FDA approved for NASAL specimens only), is one component of a comprehensive MRSA colonization surveillance program. It is not intended to diagnose MRSA infection nor to guide or monitor treatment for MRSA infections. Performed at [facility], [place] [address], [place], [place] [address]   Blood Culture (routine x 2)     Status: None (Preliminary result)   Collection Time: 05/28/18  8:17 PM  Result Value Ref Range Status   Specimen Description   Final    BLOOD RIGHT HAND Performed at [place] [address], [place], [place] [address]    Special Requests   Final    BOTTLES DRAWN AEROBIC ONLY Blood Culture results may not be optimal due to an inadequate volume of blood received in culture bottles   Culture   Final    NO GROWTH 2 DAYS Performed at [facility], [place] [address], [place],  [place] [address]    Report Status PENDING  Incomplete  Blood Culture (routine x 2)     Status: None (Preliminary result)   Collection Time: 05/28/18  8:17 PM  Result Value Ref Range Status   Specimen Description   Final    BLOOD RIGHT ANTECUBITAL Performed at [place] [address] [place], [place] [address]    Special Requests   Final    BOTTLES DRAWN AEROBIC ONLY Blood Culture results may not be optimal due to an inadequate volume of blood received in culture bottles Performed at [place] [address] [place], [place] [address]    Culture   Final    NO GROWTH 2 DAYS Performed at [place] [address], [place], [place] [address]    Report Status PENDING  Incomplete         Radiology Studies: Ct Chest Wo Contrast  Result Date: 05/28/2018 CLINICAL DATA:  33 year old female with history of nonproductive cough and body aches with fever and fatigue for the past 5 days. EXAM: CT CHEST WITHOUT CONTRAST TECHNIQUE: Multidetector CT imaging of the chest was performed following the standard protocol without IV contrast. COMPARISON:  Chest CT 06/04/2017. FINDINGS: Cardiovascular: Heart size is normal. There is no significant pericardial fluid, thickening or pericardial calcification. No atherosclerotic calcifications in the thoracic aorta or the coronary arteries. Mediastinum/Nodes: Multiple enlarged mediastinal and bilateral hilar lymph nodes measuring up to 1.8 cm in short axis in the right paratracheal nodal station. Esophagus is unremarkable in appearance. No axillary lymphadenopathy. Lungs/Pleura: Unusual appearance in the lungs with diffuse thickening of the peribronchovascular interstitium and diffuse thickening of the fissures, all of which appears micronodular. Some subpleural thickening is also noted in the periphery  of the lungs bilaterally. These findings are diffuse throughout the lungs, with minimal sparing in the extreme lung  bases, and most severe involvement throughout the mid to upper lungs. No confluent consolidative airspace disease. No pleural effusions. Upper Abdomen: Unremarkable. Musculoskeletal: There are no aggressive appearing lytic or blastic lesions noted in the visualized portions of the skeleton. IMPRESSION: 1. Unusual appearance of the thorax with a spectrum of findings most suggestive of sarcoidosis, as detailed above. Electronically Signed   By: Vinnie Langton M.D.   On: 05/28/2018 18:05   Dg Chest Portable 1 View  Result Date: 05/28/2018 CLINICAL DATA:  Cough, fever. EXAM: PORTABLE CHEST 1 VIEW COMPARISON:  Radiographs of March 08, 2018. FINDINGS: The heart size and mediastinal contours are within normal limits. No pneumothorax or pleural effusion is noted. Increased diffuse reticular densities are noted throughout both lungs, concerning for atypical infection or pneumonia. The visualized skeletal structures are unremarkable. IMPRESSION: Increased bilateral diffuse reticular densities are noted concerning for atypical infection or pneumonia. Electronically Signed   By: Marijo Conception M.D.   On: 05/28/2018 14:43        Scheduled Meds: . bictegravir-emtricitabine-tenofovir AF  1 tablet Oral Daily  . Chlorhexidine Gluconate Cloth  6 each Topical Daily  . mouth rinse  15 mL Mouth Rinse BID  . [START ON 06/03/2018] predniSONE  40 mg Oral Q breakfast   And  . [START ON 06/08/2018] predniSONE  20 mg Oral Q breakfast  . predniSONE  40 mg Oral BID  . sodium chloride flush  3 mL Intravenous Q12H   Continuous Infusions: . sodium chloride    . sodium chloride 75 mL/hr at 05/30/18 0629  . sulfamethoxazole-trimethoprim 320 mg (05/30/18 0343)     LOS: 2 days    Time spent: 25 mins.More than 50% of that time was spent in counseling and/or coordination of care.      Shelly Coss, MD [facility] Pager [phone]  If 7PM-7AM, please contact night-coverage [email or web address] Password [facility]  05/30/2018, 12:17 PM

## 2018-05-30 NOTE — Progress Notes (Signed)
[facility]   Reason for visit: Follow up on hypoxia  Interval History: CT noted and concern for sarcoid.  Now off of O2.  Afebrile about 24 hours.  WBC up to 13, on prednisone. Feels better overall.     Physical Exam: Constitutional:  Vitals:   05/30/18 0400 05/30/18 0622  BP: 115/71 107/74  Pulse: 97 (!) 102  Resp: (!) 27 17  Temp: 98.2 F (36.8 C) 98.1 F (36.7 C)  SpO2: 96% 98%   patient appears in NAD Eyes: anicteric HENT: no thrush Respiratory: Normal respiratory effort; CTA B Cardiovascular: RRR   Review of Systems: Constitutional: negative for chills and anorexia Gastrointestinal: negative for nausea and diarrhea Integument/breast: negative for new rash  Lab Results  Component Value Date   WBC 13.1 (H) 05/30/2018   HGB 7.1 (L) 05/30/2018   HCT 21.9 (L) 05/30/2018   MCV 100.5 (H) 05/30/2018   PLT 56 (L) 05/30/2018    Lab Results  Component Value Date   CREATININE 0.69 05/30/2018   BUN 9 05/30/2018   NA 134 (L) 05/30/2018   K 3.7 05/30/2018   CL 111 05/30/2018   CO2 16 (L) 05/30/2018    Lab Results  Component Value Date   ALT 31 05/28/2018   AST 44 (H) 05/28/2018   ALKPHOS 180 (H) 05/28/2018     Microbiology: Recent Results (from the past 240 hour(s))  SARS Coronavirus 2 [facility] order, Performed in [place] hospital lab)     Status: None   Collection Time: 05/28/18  1:25 PM  Result Value Ref Range Status   SARS Coronavirus 2 NEGATIVE NEGATIVE Final    Comment: (NOTE) If result is NEGATIVE SARS-CoV-2 target nucleic acids are NOT DETECTED. The SARS-CoV-2 RNA is generally detectable in upper and lower  respiratory specimens during the acute phase of infection. The lowest  concentration of SARS-CoV-2 viral copies this assay can detect is 250  copies / mL. A negative result does not preclude SARS-CoV-2 infection  and should not be used as the sole basis for treatment or other  patient management decisions.  A  negative result may occur with  improper specimen collection / handling, submission of specimen other  than nasopharyngeal swab, presence of viral mutation(s) within the  areas targeted by this assay, and inadequate number of viral copies  (<250 copies / mL). A negative result must be combined with clinical  observations, patient history, and epidemiological information. If result is POSITIVE SARS-CoV-2 target nucleic acids are DETECTED. The SARS-CoV-2 RNA is generally detectable in upper and lower  respiratory specimens dur ing the acute phase of infection.  Positive  results are indicative of active infection with SARS-CoV-2.  Clinical  correlation with patient history and other diagnostic information is  necessary to determine patient infection status.  Positive results do  not rule out bacterial infection or co-infection with other viruses. If result is PRESUMPTIVE POSTIVE SARS-CoV-2 nucleic acids MAY BE PRESENT.   A presumptive positive result was obtained on the submitted specimen  and confirmed on repeat testing.  While 2019 novel coronavirus  (SARS-CoV-2) nucleic acids may be present in the submitted sample  additional confirmatory testing may be necessary for epidemiological  and / or clinical management purposes  to differentiate between  SARS-CoV-2 and other Sarbecovirus currently known to infect humans.  If clinically indicated additional testing with an alternate test  methodology [phone]) is advised. The SARS-CoV-2 RNA is generally  detectable in upper and lower  respiratory sp ecimens during the acute  phase of infection. The expected result is Negative. Fact Sheet for Patients:  StrictlyIdeas.no Fact Sheet for Healthcare Providers: BankingDealers.co.za This test is not yet approved or cleared by the [place] FDA and has been authorized for detection and/or diagnosis of SARS-CoV-2 by FDA under an Emergency Use  Authorization (EUA).  This EUA will remain in effect (meaning this test can be used) for the duration of the COVID-19 declaration under Section 564(b)(1) of the Act, 21 U.S.C. section 360bbb-3(b)(1), unless the authorization is terminated or revoked sooner. Performed at [facility], [place] [address], [place], [place] [address]   Respiratory Panel by PCR     Status: None   Collection Time: 05/28/18  1:26 PM  Result Value Ref Range Status   Adenovirus NOT DETECTED NOT DETECTED Final   Coronavirus 229E NOT DETECTED NOT DETECTED Final    Comment: (NOTE) The Coronavirus on the Respiratory Panel, DOES NOT test for the novel  Coronavirus (2019 nCoV)    Coronavirus HKU1 NOT DETECTED NOT DETECTED Final   Coronavirus NL63 NOT DETECTED NOT DETECTED Final   Coronavirus OC43 NOT DETECTED NOT DETECTED Final   Metapneumovirus NOT DETECTED NOT DETECTED Final   Rhinovirus / Enterovirus NOT DETECTED NOT DETECTED Final   Influenza A NOT DETECTED NOT DETECTED Final   Influenza B NOT DETECTED NOT DETECTED Final   Parainfluenza Virus 1 NOT DETECTED NOT DETECTED Final   Parainfluenza Virus 2 NOT DETECTED NOT DETECTED Final   Parainfluenza Virus 3 NOT DETECTED NOT DETECTED Final   Parainfluenza Virus 4 NOT DETECTED NOT DETECTED Final   Respiratory Syncytial Virus NOT DETECTED NOT DETECTED Final   Bordetella pertussis NOT DETECTED NOT DETECTED Final   Chlamydophila pneumoniae NOT DETECTED NOT DETECTED Final   Mycoplasma pneumoniae NOT DETECTED NOT DETECTED Final    Comment: Performed at [facility], [place]. [address] [place], [place] [address]  MRSA PCR Screening     Status: None   Collection Time: 05/28/18  6:48 PM  Result Value Ref Range Status   MRSA by PCR NEGATIVE NEGATIVE Final    Comment:        The GeneXpert MRSA Assay (FDA approved for NASAL specimens only), is one component of a comprehensive MRSA colonization surveillance program. It is not intended to diagnose  MRSA infection nor to guide or monitor treatment for MRSA infections. Performed at [facility], [place] [address] [place], [place] [address]   Blood Culture (routine x 2)     Status: None (Preliminary result)   Collection Time: 05/28/18  8:17 PM  Result Value Ref Range Status   Specimen Description   Final    BLOOD RIGHT HAND Performed at [facility] [address] [place], [place] [address]    Special Requests   Final    BOTTLES DRAWN AEROBIC ONLY Blood Culture results may not be optimal due to an inadequate volume of blood received in culture bottles   Culture   Final    NO GROWTH 2 DAYS Performed at [facility], [place] [address], [place],  [address]    Report Status PENDING  Incomplete  Blood Culture (routine x 2)     Status: None (Preliminary result)   Collection Time: 05/28/18  8:17 PM  Result Value Ref Range Status   Specimen Description   Final    BLOOD RIGHT ANTECUBITAL Performed at [facility] [address] [place],  [place] [address]    Special Requests   Final    BOTTLES DRAWN AEROBIC ONLY Blood Culture results may not be optimal due to an inadequate volume of blood received in culture bottles Performed at [facility] [address], [place], [place] [address]    Culture   Final    NO GROWTH 2 DAYS Performed at [facility] [address] [place], [place] [address]    Report Status PENDING  Incomplete    Impression/Plan:  1. Possible PJP pneumonia - hypoxia, now improved, CT findings.  Concern for PJP.  On treatment including steroids.  No sputum so unable to check DFA.   Treat for 21 days including steroids.    2.  HIV - continue on Biktarvy.  No changes.  3.  Thrombocytopenia - persistent in the last week.  Not worsening.  HIT panel in process.  Will monitor.   4. Anemia - some decrease, likely dilutional.  Will repeat CBC at next visit  5.  Hilar  lymphadenopathy with peribronchovascular interstitium thickening - though possibly related to #1, it is still possible that her pulmonary/CT findings are c/w sarcoid rather than #1.  Will repeat a CXR in about 2-3 weeks.  She has follow up with pulmonary scheduled for July.     Has follow up in our clinic for Wednesday.

## 2018-05-31 LAB — CBC WITH DIFFERENTIAL/PLATELET
Abs Immature Granulocytes: 0.53 10*3/uL — ABNORMAL HIGH (ref 0.00–0.07)
Basophils Absolute: 0 10*3/uL (ref 0.0–0.1)
Basophils Relative: 0 %
Eosinophils Absolute: 0 10*3/uL (ref 0.0–0.5)
Eosinophils Relative: 0 %
HCT: 23.3 % — ABNORMAL LOW (ref 36.0–46.0)
Hemoglobin: 7.7 g/dL — ABNORMAL LOW (ref 12.0–15.0)
Immature Granulocytes: 4 %
Lymphocytes Relative: 13 %
Lymphs Abs: 1.6 10*3/uL (ref 0.7–4.0)
MCH: 32.9 pg (ref 26.0–34.0)
MCHC: 33 g/dL (ref 30.0–36.0)
MCV: 99.6 fL (ref 80.0–100.0)
Monocytes Absolute: 1.1 10*3/uL — ABNORMAL HIGH (ref 0.1–1.0)
Monocytes Relative: 8 %
Neutro Abs: 9.9 10*3/uL — ABNORMAL HIGH (ref 1.7–7.7)
Neutrophils Relative %: 75 %
Platelets: 60 10*3/uL — ABNORMAL LOW (ref 150–400)
RBC: 2.34 MIL/uL — ABNORMAL LOW (ref 3.87–5.11)
RDW: 15.7 % — ABNORMAL HIGH (ref 11.5–15.5)
WBC: 13.1 10*3/uL — ABNORMAL HIGH (ref 4.0–10.5)
nRBC: 0.9 % — ABNORMAL HIGH (ref 0.0–0.2)

## 2018-05-31 LAB — HEPARIN INDUCED PLATELET AB (HIT ANTIBODY): Heparin Induced Plt Ab: 0.081 OD (ref 0.000–0.400)

## 2018-05-31 MED ORDER — PREDNISONE 20 MG PO TABS: 40.0000 mg | ORAL_TABLET | Freq: Two times a day (BID) | ORAL | 0 refills | Status: DC

## 2018-05-31 MED ORDER — PREDNISONE 20 MG PO TABS: 20.0000 mg | ORAL_TABLET | Freq: Every day | ORAL | 0 refills | Status: DC

## 2018-05-31 MED ORDER — SULFAMETHOXAZOLE-TRIMETHOPRIM 800-160 MG PO TABS: 2.0000 | ORAL_TABLET | Freq: Three times a day (TID) | ORAL | 0 refills | Status: DC

## 2018-05-31 MED ORDER — SULFAMETHOXAZOLE-TRIMETHOPRIM 800-160 MG PO TABS: ORAL_TABLET | ORAL | Status: DC

## 2018-05-31 MED ORDER — PREDNISONE 20 MG PO TABS: 40.0000 mg | ORAL_TABLET | Freq: Every day | ORAL | 0 refills | Status: DC

## 2018-05-31 MED FILL — SULFAMETHOXAZOLE-TMP DS TAB: 800-160 | 19 days supply | Qty: 114 | Fill #0

## 2018-05-31 MED FILL — predniSONE 20 MG TABS: 20 | 3 days supply | Qty: 10 | Fill #0

## 2018-05-31 NOTE — Progress Notes (Signed)
Pt stable and comfortable. Pulse rate increases when pt up to bathroom. No change in pt condition.

## 2018-05-31 NOTE — TOC Initial Note (Signed)
Transition of Care [facility]) - Initial/Assessment Note    Patient Details  Name: Krystal Short MRN: [id number] Date of Birth: 1985-05-25  Transition of Care [facility]) CM/SW Contact:    Purcell Mouton, RN Phone Number: 05/31/2018, 11:41 AM  Clinical Narrative:                   Expected Discharge Plan: Home/Self Care Barriers to Discharge: No Barriers Identified   Patient Goals and CMS Choice        Expected Discharge Plan and Services Expected Discharge Plan: Home/Self Care   Discharge Planning Services: CM Consult   Living arrangements for the past 2 months: Single Family Home Expected Discharge Date: 05/31/18                                    Prior Living Arrangements/Services Living arrangements for the past 2 months: Single Family Home Lives with:: Spouse Patient language and need for interpreter reviewed:: No Do you feel safe going back to the place where you live?: Yes               Activities of Daily Living Home Assistive Devices/Equipment: None ADL Screening (condition at time of admission) Patient's cognitive ability adequate to safely complete daily activities?: Yes Is the patient deaf or have difficulty hearing?: No Does the patient have difficulty seeing, even when wearing glasses/contacts?: No Does the patient have difficulty concentrating, remembering, or making decisions?: No Patient able to express need for assistance with ADLs?: Yes Does the patient have difficulty dressing or bathing?: No Independently performs ADLs?: Yes (appropriate for developmental age) Does the patient have difficulty walking or climbing stairs?: Yes(secondary to weakness) Weakness of Legs: Both Weakness of Arms/Hands: None  Permission Sought/Granted                  Emotional Assessment     Affect (typically observed): Accepting Orientation: : Oriented to Self, Oriented to Place, Oriented to  Time, Oriented to Situation      Admission diagnosis:   Hypoxemia [R09.02] SIRS (systemic inflammatory response syndrome) (HCC) [R65.10] Patient Active Problem List   Diagnosis Date Noted  . Pneumonia 05/28/2018  . Shortness of breath 05/28/2018  . Acute respiratory failure with hypoxia ([place]) 03/08/2018  . CAP (community acquired pneumonia) 03/08/2018  . Healthcare maintenance 12/31/2017  . Cough 08/15/2017  . Molluscum contagiosum 06/27/2017  . AIDS ([place])   . Tachycardia 06/03/2017  . Tobacco abuse 06/03/2017   PCP:  Dianna Rossetti, NP (Inactive) Pharmacy:   [id number] - [place], [place] [address] Phone: [phone] Fax: [phone]  [place], [place] - [address] [facility]. [address] [place] [address] Phone: [phone] Fax: [phone]     Social Determinants of Health (SDOH) Interventions    Readmission Risk Interventions No flowsheet data found.

## 2018-05-31 NOTE — Discharge Summary (Signed)
Physician Discharge Summary  Krystal Short NID:[id number] DOB: [birth year] DOA: 05/28/2018  PCP: Dianna Rossetti, NP (Inactive)  Admit date: 05/28/2018 Discharge date: 05/31/2018  Admitted From: Home Disposition:  Home  Discharge Condition:Stable CODE STATUS:FULL Diet recommendation: Regular  Brief/Interim Summary: Patient is a 33 year old African-American female with a past medical history of HIV, PCP  who presents to the emergency department with complaints of fever, shortness of breath, cough.  Chest x-ray done on presentation suggestive of atypical infection.  COVID-19 ruled out.  Possibility of PCP pneumonia.  ID consulted.  CT chest showed enlarged lymph nodes.  Patient was started on Bactrim and prednisone for PCP.  Her respiratory status significantly improved.  She is hemodynamically stable for discharge today.  She will be called by pulmonology for follow-up because her CT was concerning for sarcoidosis although there is less possibility.  Following problems were addressed during her hospitalization:  Atypical pneumonia/likely PCP : Respiratory status remains stable.Saturating fine  on room air.  No complaints of shortness of breath or cough.   Started treatment for PC pneumonia as per ID. On bactrim and prednisone. ID had been following.  Repeat chest x-ray in 2 to 3 weeks.  Follow-up with pulmonology as an outpatient.   Possible sarcoidosis: No history of sarcoidosis in the past.  CT chest showed enlarged mediastinal and bilateral hilar lymph nodes.  Low possibility of sarcoidosis. I have notified pulmonary team for arranging  outpatient follow-up.  HIV/AIDS: Last CD4 count was 10 about 2 months ago.  Follows with infectious disease.  ID was following.  Thrombocytopenia/Anemia: Most likely associated with HIV medications.  Continue outpatient monitoring.  She has been transfused in the past for anemia.  Hypomagnesemia: Supplemented.  Sinus tachycardia: Much  improved.   Discharge Diagnoses:  Active Problems:   Pneumonia    Discharge Instructions  Discharge Instructions    Diet - low sodium heart healthy   Complete by:  As directed    Discharge instructions   Complete by:  As directed    1)Please take prescribed medications as instructed. 2)Follow up with Infectious disease as an outpatient. 3)Follow up with your PCP in 2 weeks.  Do a CBC test during  follow-up.Do a chest Xray during the follow up. 4)You will be called by pulmonology for follow-up as an outpatient.   Increase activity slowly   Complete by:  As directed      Allergies as of 05/31/2018      Reactions   Heparin       Medication List    TAKE these medications   acetaminophen 325 MG tablet Commonly known as:  TYLENOL Take 650 mg by mouth every 6 (six) hours as needed for mild pain or headache.   bictegravir-emtricitabine-tenofovir AF 50-200-25 MG Tabs tablet Commonly known as:  Biktarvy Take 1 tablet by mouth daily.   imiquimod 5 % cream Commonly known as:  ALDARA Apply 1 application topically See admin instructions. Every 3 days   ondansetron 4 MG tablet Commonly known as:  ZOFRAN Take 1 tablet (4 mg total) by mouth every 6 (six) hours.   predniSONE 20 MG tablet Commonly known as:  DELTASONE Take 2 tablets (40 mg total) by mouth 2 (two) times daily.   predniSONE 20 MG tablet Commonly known as:  DELTASONE Take 2 tablets (40 mg total) by mouth daily with breakfast. Start taking on:  June 03, 2018   predniSONE 20 MG tablet Commonly known as:  DELTASONE Take 1 tablet (20 mg total) by mouth  daily with breakfast. Start taking on:  Jun 08, 2018   sulfamethoxazole-trimethoprim 800-160 MG tablet Commonly known as:  BACTRIM DS Resume this only after finishing the new prescription What changed:    how much to take  how to take this  when to take this  additional instructions   sulfamethoxazole-trimethoprim 800-160 MG tablet Commonly known as:   BACTRIM DS Take 2 tablets by mouth every 8 (eight) hours for 19 days. What changed:  You were already taking a medication with the same name, and this prescription was added. Make sure you understand how and when to take each.      Follow-up Information    Dianna Rossetti, NP. Schedule an appointment as soon as possible for a visit in 1 week(s).   Specialty:  Nurse Practitioner Contact information: [address] [place] [address] [phone]          Allergies  Allergen Reactions  . Heparin     Consultations: ID  Procedures/Studies: Ct Chest Wo Contrast  Result Date: 05/28/2018 CLINICAL DATA:  33 year old female with history of nonproductive cough and body aches with fever and fatigue for the past 5 days. EXAM: CT CHEST WITHOUT CONTRAST TECHNIQUE: Multidetector CT imaging of the chest was performed following the standard protocol without IV contrast. COMPARISON:  Chest CT 06/04/2017. FINDINGS: Cardiovascular: Heart size is normal. There is no significant pericardial fluid, thickening or pericardial calcification. No atherosclerotic calcifications in the thoracic aorta or the coronary arteries. Mediastinum/Nodes: Multiple enlarged mediastinal and bilateral hilar lymph nodes measuring up to 1.8 cm in short axis in the right paratracheal nodal station. Esophagus is unremarkable in appearance. No axillary lymphadenopathy. Lungs/Pleura: Unusual appearance in the lungs with diffuse thickening of the peribronchovascular interstitium and diffuse thickening of the fissures, all of which appears micronodular. Some subpleural thickening is also noted in the periphery of the lungs bilaterally. These findings are diffuse throughout the lungs, with minimal sparing in the extreme lung bases, and most severe involvement throughout the mid to upper lungs. No confluent consolidative airspace disease. No pleural effusions. Upper Abdomen: Unremarkable. Musculoskeletal: There are no  aggressive appearing lytic or blastic lesions noted in the visualized portions of the skeleton. IMPRESSION: 1. Unusual appearance of the thorax with a spectrum of findings most suggestive of sarcoidosis, as detailed above. Electronically Signed   By: Vinnie Langton M.D.   On: 05/28/2018 18:05   Dg Chest Portable 1 View  Result Date: 05/28/2018 CLINICAL DATA:  Cough, fever. EXAM: PORTABLE CHEST 1 VIEW COMPARISON:  Radiographs of March 08, 2018. FINDINGS: The heart size and mediastinal contours are within normal limits. No pneumothorax or pleural effusion is noted. Increased diffuse reticular densities are noted throughout both lungs, concerning for atypical infection or pneumonia. The visualized skeletal structures are unremarkable. IMPRESSION: Increased bilateral diffuse reticular densities are noted concerning for atypical infection or pneumonia. Electronically Signed   By: Marijo Conception M.D.   On: 05/28/2018 14:43       Subjective: Patient seen and examined at bedside this morning.  Remains comfortable.  Hemodynamically stable for discharge.  Discharge Exam: Vitals:   05/31/18 0410 05/31/18 0835  BP: 112/72 114/75  Pulse: (!) 109 (!) 116  Resp: 20 18  Temp: 97.6 F (36.4 C) 98.3 F (36.8 C)  SpO2: 95% 98%   Vitals:   05/30/18 2001 05/31/18 0014 05/31/18 0410 05/31/18 0835  BP: (!) 103/58 110/82 112/72 114/75  Pulse: (!) 107 96 (!) 109 (!) 116  Resp:  18 18 20 18   Temp:  98 F (36.7 C) 97.6 F (36.4 C) 98.3 F (36.8 C)  TempSrc: Oral Oral Oral Oral  SpO2: 96% 92% 95% 98%  Weight:      Height:        General: Pt is alert, awake, not in acute distress Cardiovascular: RRR, S1/S2 +, no rubs, no gallops Respiratory: CTA bilaterally, no wheezing, no rhonchi Abdominal: Soft, NT, ND, bowel sounds + Extremities: no edema, no cyanosis    The results of significant diagnostics from this hospitalization (including imaging, microbiology, ancillary and laboratory) are listed  below for reference.     Microbiology: Recent Results (from the past 240 hour(s))  SARS Coronavirus 2 [facility] order, Performed in [facility] hospital lab)     Status: None   Collection Time: 05/28/18  1:25 PM  Result Value Ref Range Status   SARS Coronavirus 2 NEGATIVE NEGATIVE Final    Comment: (NOTE) If result is NEGATIVE SARS-CoV-2 target nucleic acids are NOT DETECTED. The SARS-CoV-2 RNA is generally detectable in upper and lower  respiratory specimens during the acute phase of infection. The lowest  concentration of SARS-CoV-2 viral copies this assay can detect is 250  copies / mL. A negative result does not preclude SARS-CoV-2 infection  and should not be used as the sole basis for treatment or other  patient management decisions.  A negative result may occur with  improper specimen collection / handling, submission of specimen other  than nasopharyngeal swab, presence of viral mutation(s) within the  areas targeted by this assay, and inadequate number of viral copies  (<250 copies / mL). A negative result must be combined with clinical  observations, patient history, and epidemiological information. If result is POSITIVE SARS-CoV-2 target nucleic acids are DETECTED. The SARS-CoV-2 RNA is generally detectable in upper and lower  respiratory specimens dur ing the acute phase of infection.  Positive  results are indicative of active infection with SARS-CoV-2.  Clinical  correlation with patient history and other diagnostic information is  necessary to determine patient infection status.  Positive results do  not rule out bacterial infection or co-infection with other viruses. If result is PRESUMPTIVE POSTIVE SARS-CoV-2 nucleic acids MAY BE PRESENT.   A presumptive positive result was obtained on the submitted specimen  and confirmed on repeat testing.  While 2019 novel coronavirus  (SARS-CoV-2) nucleic acids may be present in the submitted sample  additional confirmatory  testing may be necessary for epidemiological  and / or clinical management purposes  to differentiate between  SARS-CoV-2 and other Sarbecovirus currently known to infect humans.  If clinically indicated additional testing with an alternate test  methodology [phone]) is advised. The SARS-CoV-2 RNA is generally  detectable in upper and lower respiratory sp ecimens during the acute  phase of infection. The expected result is Negative. Fact Sheet for Patients:  StrictlyIdeas.no Fact Sheet for Healthcare Providers: BankingDealers.co.za This test is not yet approved or cleared by the [place] FDA and has been authorized for detection and/or diagnosis of SARS-CoV-2 by FDA under an Emergency Use Authorization (EUA).  This EUA will remain in effect (meaning this test can be used) for the duration of the COVID-19 declaration under Section 564(b)(1) of the Act, 21 U.S.C. section 360bbb-3(b)(1), unless the authorization is terminated or revoked sooner. Performed at [facility], [place] [address], [place], [place] [address]   Respiratory Panel by PCR     Status: None   Collection Time: 05/28/18  1:26  PM  Result Value Ref Range Status   Adenovirus NOT DETECTED NOT DETECTED Final   Coronavirus 229E NOT DETECTED NOT DETECTED Final    Comment: (NOTE) The Coronavirus on the Respiratory Panel, DOES NOT test for the novel  Coronavirus (2019 nCoV)    Coronavirus HKU1 NOT DETECTED NOT DETECTED Final   Coronavirus NL63 NOT DETECTED NOT DETECTED Final   Coronavirus OC43 NOT DETECTED NOT DETECTED Final   Metapneumovirus NOT DETECTED NOT DETECTED Final   Rhinovirus / Enterovirus NOT DETECTED NOT DETECTED Final   Influenza A NOT DETECTED NOT DETECTED Final   Influenza B NOT DETECTED NOT DETECTED Final   Parainfluenza Virus 1 NOT DETECTED NOT DETECTED Final   Parainfluenza Virus 2 NOT DETECTED NOT DETECTED Final   Parainfluenza Virus 3  NOT DETECTED NOT DETECTED Final   Parainfluenza Virus 4 NOT DETECTED NOT DETECTED Final   Respiratory Syncytial Virus NOT DETECTED NOT DETECTED Final   Bordetella pertussis NOT DETECTED NOT DETECTED Final   Chlamydophila pneumoniae NOT DETECTED NOT DETECTED Final   Mycoplasma pneumoniae NOT DETECTED NOT DETECTED Final    Comment: Performed at [facility], [place] [address], [place], [place] [address]  MRSA PCR Screening     Status: None   Collection Time: 05/28/18  6:48 PM  Result Value Ref Range Status   MRSA by PCR NEGATIVE NEGATIVE Final    Comment:        The GeneXpert MRSA Assay (FDA approved for NASAL specimens only), is one component of a comprehensive MRSA colonization surveillance program. It is not intended to diagnose MRSA infection nor to guide or monitor treatment for MRSA infections. Performed at [facility], [place] [address] [place], [place] [address]   Blood Culture (routine x 2)     Status: None (Preliminary result)   Collection Time: 05/28/18  8:17 PM  Result Value Ref Range Status   Specimen Description   Final    BLOOD RIGHT HAND Performed at [place] [address] [place], [place] [address]    Special Requests   Final    BOTTLES DRAWN AEROBIC ONLY Blood Culture results may not be optimal due to an inadequate volume of blood received in culture bottles   Culture   Final    NO GROWTH 3 DAYS Performed at [facility], [place] [address] [place], [place] [address]    Report Status PENDING  Incomplete  Blood Culture (routine x 2)     Status: None (Preliminary result)   Collection Time: 05/28/18  8:17 PM  Result Value Ref Range Status   Specimen Description   Final    BLOOD RIGHT ANTECUBITAL Performed at [place] [address] [place], [place] [address]    Special Requests   Final    BOTTLES DRAWN AEROBIC ONLY Blood Culture results may not be optimal due to an inadequate volume of  blood received in culture bottles Performed at [place] [address], [place], [place] [address]    Culture   Final    NO GROWTH 3 DAYS Performed at [facility], [place] [address] [place], [place] [address]    Report Status PENDING  Incomplete     Labs: BNP (last 3 results) Recent Labs    06/04/17 1633 03/08/18 2129  BNP 118.3* 8.8   Basic Metabolic Panel: Recent Labs  Lab 05/28/18 1322 05/28/18 2017 05/29/18 0039 05/30/18 0253  NA 132*  --  133* 134*  K 4.0  --  3.7 3.7  CL 104  --  110 111  CO2 16*  --  15* 16*  GLUCOSE 91  --  126* 125*  BUN 16  --  11 9  CREATININE 1.02*  --  0.87 0.69  CALCIUM 8.2*  --  7.4* 7.7*  MG  --  1.5*  --  2.2  PHOS  --  3.0  --   --    Liver Function Tests: Recent Labs  Lab 05/28/18 1322  AST 44*  ALT 31  ALKPHOS 180*  BILITOT 1.2  PROT 8.1  ALBUMIN 2.4*   No results for input(s): LIPASE, AMYLASE in the last 168 hours. No results for input(s): AMMONIA in the last 168 hours. CBC: Recent Labs  Lab 05/28/18 1322 05/29/18 0039 05/30/18 0253 05/31/18 0415  WBC 10.0 13.0* 13.1* 13.1*  NEUTROABS 8.7*  --  11.5* 9.9*  HGB 8.6* 8.2* 7.1* 7.7*  HCT 25.4* 24.3* 21.9* 23.3*  MCV 99.6 97.6 100.5* 99.6  PLT 68* 63* 56* 60*   Cardiac Enzymes: Recent Labs  Lab 05/28/18 1322  TROPONINI <0.03   BNP: Invalid input(s): POCBNP CBG: No results for input(s): GLUCAP in the last 168 hours. D-Dimer Recent Labs    05/28/18 1322  DDIMER 0.89*   Hgb A1c No results for input(s): HGBA1C in the last 72 hours. Lipid Profile Recent Labs    05/28/18 1325  TRIG 312*   Thyroid function studies No results for input(s): TSH, T4TOTAL, T3FREE, THYROIDAB in the last 72 hours.  Invalid input(s): FREET3 Anemia work up Recent Labs    05/28/18 1325 05/30/18 0904  FERRITIN 1,229* 603*  TIBC  --  238*  IRON  --  34   Urinalysis    Component Value Date/Time   COLORURINE YELLOW 05/30/2018 0825    APPEARANCEUR CLEAR 05/30/2018 0825   LABSPEC 1.014 05/30/2018 0825   PHURINE 6.0 05/30/2018 0825   GLUCOSEU NEGATIVE 05/30/2018 0825   HGBUR NEGATIVE 05/30/2018 0825   BILIRUBINUR NEGATIVE 05/30/2018 0825   KETONESUR NEGATIVE 05/30/2018 0825   PROTEINUR NEGATIVE 05/30/2018 0825   UROBILINOGEN 1.0 12/03/2011 0550   NITRITE NEGATIVE 05/30/2018 0825   LEUKOCYTESUR NEGATIVE 05/30/2018 0825   Sepsis Labs Invalid input(s): PROCALCITONIN,  WBC,  LACTICIDVEN Microbiology Recent Results (from the past 240 hour(s))  SARS Coronavirus 2 [facility] order, Performed in [place] hospital lab)     Status: None   Collection Time: 05/28/18  1:25 PM  Result Value Ref Range Status   SARS Coronavirus 2 NEGATIVE NEGATIVE Final    Comment: (NOTE) If result is NEGATIVE SARS-CoV-2 target nucleic acids are NOT DETECTED. The SARS-CoV-2 RNA is generally detectable in upper and lower  respiratory specimens during the acute phase of infection. The lowest  concentration of SARS-CoV-2 viral copies this assay can detect is 250  copies / mL. A negative result does not preclude SARS-CoV-2 infection  and should not be used as the sole basis for treatment or other  patient management decisions.  A negative result may occur with  improper specimen collection / handling, submission of specimen other  than nasopharyngeal swab, presence of viral mutation(s) within the  areas targeted by this assay, and inadequate number of viral copies  (<250 copies / mL). A negative result must be combined with clinical  observations, patient history, and epidemiological information. If result is POSITIVE SARS-CoV-2 target nucleic acids are DETECTED. The SARS-CoV-2 RNA is generally detectable in upper and lower  respiratory specimens dur ing the acute phase of infection.  Positive  results are indicative of active infection with SARS-CoV-2.  Clinical  correlation with patient history and other diagnostic information is  necessary  to determine patient infection status.  Positive results do  not rule out bacterial infection or co-infection with other viruses. If result is PRESUMPTIVE POSTIVE SARS-CoV-2 nucleic acids MAY BE PRESENT.   A presumptive positive result was obtained on the submitted specimen  and confirmed on repeat testing.  While 2019 novel coronavirus  (SARS-CoV-2) nucleic acids may be present in the submitted sample  additional confirmatory testing may be necessary for epidemiological  and / or clinical management purposes  to differentiate between  SARS-CoV-2 and other Sarbecovirus currently known to infect humans.  If clinically indicated additional testing with an alternate test  methodology [phone]) is advised. The SARS-CoV-2 RNA is generally  detectable in upper and lower respiratory sp ecimens during the acute  phase of infection. The expected result is Negative. Fact Sheet for Patients:  StrictlyIdeas.no Fact Sheet for Healthcare Providers: BankingDealers.co.za This test is not yet approved or cleared by the [place] FDA and has been authorized for detection and/or diagnosis of SARS-CoV-2 by FDA under an Emergency Use Authorization (EUA).  This EUA will remain in effect (meaning this test can be used) for the duration of the COVID-19 declaration under Section 564(b)(1) of the Act, 21 U.S.C. section 360bbb-3(b)(1), unless the authorization is terminated or revoked sooner. Performed at [facility], [place] [address] [place], [place] [address]   Respiratory Panel by PCR     Status: None   Collection Time: 05/28/18  1:26 PM  Result Value Ref Range Status   Adenovirus NOT DETECTED NOT DETECTED Final   Coronavirus 229E NOT DETECTED NOT DETECTED Final    Comment: (NOTE) The Coronavirus on the Respiratory Panel, DOES NOT test for the novel  Coronavirus (2019 nCoV)    Coronavirus HKU1 NOT DETECTED NOT DETECTED Final    Coronavirus NL63 NOT DETECTED NOT DETECTED Final   Coronavirus OC43 NOT DETECTED NOT DETECTED Final   Metapneumovirus NOT DETECTED NOT DETECTED Final   Rhinovirus / Enterovirus NOT DETECTED NOT DETECTED Final   Influenza A NOT DETECTED NOT DETECTED Final   Influenza B NOT DETECTED NOT DETECTED Final   Parainfluenza Virus 1 NOT DETECTED NOT DETECTED Final   Parainfluenza Virus 2 NOT DETECTED NOT DETECTED Final   Parainfluenza Virus 3 NOT DETECTED NOT DETECTED Final   Parainfluenza Virus 4 NOT DETECTED NOT DETECTED Final   Respiratory Syncytial Virus NOT DETECTED NOT DETECTED Final   Bordetella pertussis NOT DETECTED NOT DETECTED Final   Chlamydophila pneumoniae NOT DETECTED NOT DETECTED Final   Mycoplasma pneumoniae NOT DETECTED NOT DETECTED Final    Comment: Performed at [facility], [place]. [address], [place], [place] [address]  MRSA PCR Screening     Status: None   Collection Time: 05/28/18  6:48 PM  Result Value Ref Range Status   MRSA by PCR NEGATIVE NEGATIVE Final    Comment:        The GeneXpert MRSA Assay (FDA approved for NASAL specimens only), is one component of a comprehensive MRSA colonization surveillance program. It is not intended to diagnose MRSA infection nor to guide or monitor treatment for MRSA infections. Performed at [facility], [place] [address], [place], [place] [address]   Blood Culture (routine x 2)     Status: None (Preliminary result)   Collection Time:  05/28/18  8:17 PM  Result Value Ref Range Status   Specimen Description   Final    BLOOD RIGHT HAND Performed at [place] [address], [place], [place] [address]    Special Requests   Final    BOTTLES DRAWN AEROBIC ONLY Blood Culture results may not be optimal due to an inadequate volume of blood received in culture bottles   Culture   Final    NO GROWTH 3 DAYS Performed at [facility], [place] [address], [place], [place] [address]    Report  Status PENDING  Incomplete  Blood Culture (routine x 2)     Status: None (Preliminary result)   Collection Time: 05/28/18  8:17 PM  Result Value Ref Range Status   Specimen Description   Final    BLOOD RIGHT ANTECUBITAL Performed at [place] [address], [place], [place] [address]    Special Requests   Final    BOTTLES DRAWN AEROBIC ONLY Blood Culture results may not be optimal due to an inadequate volume of blood received in culture bottles Performed at [place] [address], [place], [place] [address]    Culture   Final    NO GROWTH 3 DAYS Performed at [facility], [place] [address] [place], [place] [address]    Report Status PENDING  Incomplete    Please note: You were cared for by a hospitalist during your hospital stay. Once you are discharged, your primary care physician will handle any further medical issues. Please note that NO REFILLS for any discharge medications will be authorized once you are discharged, as it is imperative that you return to your primary care physician (or establish a relationship with a primary care physician if you do not have one) for your post hospital discharge needs so that they can reassess your need for medications and monitor your lab values.    Time coordinating discharge: 40 minutes  SIGNED:   Shelly Coss, MD  [facility] 05/31/2018, 10:02 AM Pager [phone]  If 7PM-7AM, please contact night-coverage [email or web address] Password [email or web address]

## 2018-06-01 LAB — LEGIONELLA PNEUMOPHILA SEROGP 1 UR AG: L. pneumophila Serogp 1 Ur Ag: NEGATIVE

## 2018-06-02 LAB — CULTURE, BLOOD (ROUTINE X 2)
Culture: NO GROWTH
Culture: NO GROWTH

## 2018-06-03 ENCOUNTER — Other Ambulatory Visit: Payer: Self-pay | Admitting: *Deleted

## 2018-06-03 ENCOUNTER — Encounter: Payer: Self-pay | Admitting: *Deleted

## 2018-06-03 NOTE — Patient Outreach (Addendum)
[facility]) Care Management  06/03/2018  Krystal Short [birth year] [id number]  Transition of care call   Referral received: Initial outreach: Insurance: [place] [employer]   Subjective: Initial successful telephone call to patient's preferred number in order to complete transition of care assessment; 2 HIPAA identifiers verified. Explained purpose of call and completed transition of care assessment.  Krystal Short states she is doing OK, still somewhat weak, occasional cough, no fever, she says she is taking her temperature 3 times daily. Tolerating  diet , denies bowel or bladder problems.  Spouse is assisting  with her recovery.  She voices understanding that she should have a follow up chest X ray and CBC when she sess her primary care provider, preferably in the next week.  She says she does not have the hospital indemnity insurance.   Objective:  Krystal Short was hospitalized at [facility] from 4/21-4/24/2020 for atypical pneumonia. Comorbidities include: HIV/AIDS, anemia She was discharged to home on 05/31/18 without the need for home health services or DME.   Assessment:  Patient voices good understanding of all discharge instructions.  See transition of care flowsheet for assessment details.   Plan:  It was noted that she has a bad debt status with her [facility] primary care provider so provided Krystal Short with the phone number and e-mail address for [facility] benefits and suggested she contact them to see if she is eligible for the [place]" to help her with her providers' office visits copays.  Reviewed post hospital discharge treatment plan for atypical pneumonia and HIV/AIDS, medication adherence and the importance of follow up with her primary care provider for chest x ray and CBC and specialist with infectious disease and pulmonologist. No ongoing care management needs identified so will close case to [place] [facility] care management services and route successful outreach letter with [place] Management pamphlet and 24 Hour Nurse Line Magnet to [place] [facility] clinical pool to be mailed to patient's home address.   Barrington Ellison RN,CCM,CDE [place] [facility] Coordinator Office Phone [phone] Office Fax [phone]

## 2018-06-04 ENCOUNTER — Telehealth: Payer: Self-pay | Admitting: Infectious Diseases

## 2018-06-04 NOTE — Telephone Encounter (Signed)
COVID-19 Pre-Screening Questions: ° °Do you currently have a fever (>100 °F), chills or unexplained body aches? No  °Are you currently experiencing new cough, shortness of breath, sore throat, runny nose?no  °•  °Have you recently travelled outside the state of [place] in the last 14 days?no  °•  °Have you been in contact with someone that is currently pending confirmation of Covid19 testing or has been confirmed to have the Covid19 virus?  No  °

## 2018-06-05 ENCOUNTER — Encounter: Payer: Self-pay | Admitting: Infectious Diseases

## 2018-06-05 ENCOUNTER — Other Ambulatory Visit: Payer: Self-pay

## 2018-06-05 ENCOUNTER — Ambulatory Visit (INDEPENDENT_AMBULATORY_CARE_PROVIDER_SITE_OTHER): Payer: 59 | Admitting: Infectious Diseases

## 2018-06-05 VITALS — BP 85/59 | HR 105 | Temp 97.7°F | Wt 120.0 lb

## 2018-06-05 DIAGNOSIS — B20 Human immunodeficiency virus [HIV] disease: Secondary | ICD-10-CM

## 2018-06-05 DIAGNOSIS — R0602 Shortness of breath: Secondary | ICD-10-CM

## 2018-06-05 DIAGNOSIS — B59 Pneumocystosis: Secondary | ICD-10-CM | POA: Diagnosis not present

## 2018-06-05 DIAGNOSIS — R000 Tachycardia, unspecified: Secondary | ICD-10-CM

## 2018-06-05 DIAGNOSIS — B3781 Candidal esophagitis: Secondary | ICD-10-CM

## 2018-06-05 DIAGNOSIS — J189 Pneumonia, unspecified organism: Secondary | ICD-10-CM | POA: Diagnosis not present

## 2018-06-05 MED ORDER — FLUCONAZOLE 200 MG PO TABS: 200.0000 mg | ORAL_TABLET | Freq: Every day | ORAL | 0 refills | Status: DC

## 2018-06-05 NOTE — Assessment & Plan Note (Addendum)
She is not hypoxic on exam today and is breathing normally with normal lung assessment.  This is multifactorial considering her presumed PCP diagnosis, sarcoidosis, profound anemia.  Return precautions to emergency room were discussed today.

## 2018-06-05 NOTE — Assessment & Plan Note (Signed)
Systolic blood pressure less than 90 today with mild tachycardia.  She wants to start the fluconazole to treat her thrush and rehydrate with oral fluids if possible.  She will come back to see me at the beginning of next week for close follow-up.  Advised strict precautions to return to urgent care or ER to receive intravenous hydration provided and discussed today. She is severely anemic with hemoglobin of 7.7.  This is chronic however may benefit from a blood transfusion as she has been rehydrated with intravenous fluids multiple times.  We will repeat CBC on Monday.

## 2018-06-05 NOTE — Assessment & Plan Note (Addendum)
She is not hypoxic and currently on day of treatment.  She will continue her Bactrim and prednisone for 21-day course.  Would like to see her again at the end of treatment to repeat her chest x-ray and follow-up how she is feeling.  May need to repeat steroids for her given her very low CD4 count.  Her prescription on file from the hospital is unclear with regards to how much prednisone she was dispensed.  We will ask her to bring her bottle on Monday so we can see how much she has left and ensure she is taking it as prescribed. She should be on 20 mg QD soon.

## 2018-06-05 NOTE — Assessment & Plan Note (Signed)
She has been adherent to her [place] however has ongoing findings consistent with AIDS defining illnesses with oral thrush, PCP pneumonia and CD4 counts that have remained below 100.  I would like for her to resume her azithromycin once weekly.  If she does have an underlying diagnosis of sarcoidosis I wonder if this is contributing to her ongoing low CD4 as well.  Discussed today that she may take quite a while to rebound given all that is going on. She has regular follow-up scheduled with Marya Amsler in June however I plan on seeing her back next week for a check-in and moving her appointment up sooner to reevaluate at the end of her pneumonia treatment to repeat a chest x-ray and ensure she is doing well off steroids.

## 2018-06-05 NOTE — Progress Notes (Signed)
Name: Krystal Short  DOB: 07-Jul-1985 MRN: [id number] PCP: [name]    Patient Active Problem List   Diagnosis Date Noted  . Pneumonia 05/28/2018  . Shortness of breath 05/28/2018  . Pneumocystis jiroveci pneumonia ([place]) 03/08/2018  . Healthcare maintenance 12/31/2017  . Cough 08/15/2017  . Molluscum contagiosum 06/27/2017  . AIDS ([place])   . 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 ([place])   . Esophageal candidiasis ([place])   . Tachycardia 06/03/2017     Subjective:  CC: Hospital follow-up  HPI: Krystal Short is here for hospital follow-up.  She was hospitalized at [facility] from April 21 through the 24th.  She was seen by Dr. Linus Salmons.  Ruled out but 19, respiratory viral panel PCR negative, flu negative.  She has had kind of a subacute worsening of shortness of breath and cough.  This combined with her CD4 count of 10 was concerning for PJP pneumonia.  She was started presumptively on treatment with Bactrim and his own taper. Unable to perform induced sputum to confirm diagnosis.  CT scan revealed an unusual appearance of the thorax with a spectrum of findings most suggestive for sarcoidosis.  Pulmonology team did see her in the hospital and outpatient visit has been arranged.  Has been on her [place] for about a year now.  2 months ago showed her viral load was undetectable.  CD4 count has been very slow to reconstitute and is about the same as of a year ago.  She was in the past taking azithromycin however she is not taking this currently.  Continues to take her Bactrim 3 times a day and is down to prednisone once daily.  She is still feeling weak and is experiencing shortness of breath doing normal things around the house.  She reports some mild and occasional dizziness with standing.  This is been worse lately because she is not been able to hold fluids down due to painful swallowing and thinks she is got some thrush in her mouth.  Did a telephone call  with her new PCP today.  Currently remains out of work.   Depression screen [facility] 2/9 06/05/2018  Decreased Interest 0  Down, Depressed, Hopeless 0  PHQ - 2 Score 0  Altered sleeping -  Tired, decreased energy -  Change in appetite -  Feeling bad or failure about yourself  -  Trouble concentrating -  Moving slowly or fidgety/restless -  Suicidal thoughts -  PHQ-9 Score -    Review of Systems  Constitutional: Positive for malaise/fatigue. Negative for chills, fever and weight loss.  HENT: Positive for congestion and sore throat (thrush). Negative for sinus pain.   Respiratory: Positive for cough (dry) and shortness of breath. Negative for hemoptysis.   Cardiovascular: Negative for chest pain, orthopnea and leg swelling.  Gastrointestinal: Negative for abdominal pain, diarrhea and vomiting.  Genitourinary: Negative.   Musculoskeletal: Negative.   Skin: Positive for rash (face).  Neurological: Negative for headaches.    Past Medical History:  Diagnosis Date  . Acute hyponatremia 06/03/2017  . CAP (community acquired pneumonia) 06/03/2017  . Community acquired pneumonia 06/05/2017  . Facial dermatitis 06/03/2017  . HIV (human immunodeficiency virus infection) ([place])   . Pleurisy 06/03/2017  . Pneumonia 06/06/2017  . 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 ([place])   . Thrush of mouth and esophagus ([place])   . UTI (urinary tract infection)     Outpatient Medications Prior to Visit  Medication Sig  Dispense Refill  . acetaminophen (TYLENOL) 325 MG tablet Take 650 mg by mouth every 6 (six) hours as needed for mild pain or headache.    . bictegravir-emtricitabine-tenofovir AF (BIKTARVY) 50-200-25 MG TABS tablet Take 1 tablet by mouth daily. 30 tablet 3  . imiquimod (ALDARA) 5 % cream Apply 1 application topically See admin instructions. Every 3 days    . ondansetron (ZOFRAN) 4 MG tablet Take 1 tablet (4 mg total) by mouth every 6 (six) hours. 12 tablet 0  . predniSONE  (DELTASONE) 20 MG tablet Take 2 tablets (40 mg total) by mouth 2 (two) times daily. 10 tablet 0  . sulfamethoxazole-trimethoprim (BACTRIM DS) 800-160 MG tablet Take 2 tablets by mouth every 8 (eight) hours for 19 days. 114 tablet 0  . predniSONE (DELTASONE) 20 MG tablet Take 2 tablets (40 mg total) by mouth daily with breakfast. 10 tablet 0  . [START ON 06/08/2018] predniSONE (DELTASONE) 20 MG tablet Take 1 tablet (20 mg total) by mouth daily with breakfast. 11 tablet 0  . sulfamethoxazole-trimethoprim (BACTRIM DS) 800-160 MG tablet Resume this only after finishing the new prescription     No facility-administered medications prior to visit.      Allergies  Allergen Reactions  . Heparin     Social History   Tobacco Use  . Smoking status: Former Smoker    Types: Cigarettes    Last attempt to quit: 12/25/2016    Years since quitting: 1.4  . Smokeless tobacco: Never Used  Substance Use Topics  . Alcohol use: Yes    Comment: weekend/socially  . Drug use: No    Family History  Problem Relation Age of Onset  . Healthy Father   . Healthy Mother     Social History   Substance and Sexual Activity  Sexual Activity Yes  . Birth control/protection: Injection     Objective:   Vitals:   06/05/18 1625  BP: (!) 85/59  Pulse: (!) 105  Temp: 97.7 F (36.5 C)  SpO2: 100%  Weight: 120 lb (54.4 kg)   Body mass index is 22.31 kg/m.  Physical Exam Constitutional:      Comments: Seated comfortably in the chair today in no distress.  She appears tired.  Breathing comfortably.  HENT:     Nose: Congestion present.     Mouth/Throat:     Comments: White patches consistent with oral thrush overlying soft palate and posterior oropharynx Eyes:     General: No scleral icterus.    Conjunctiva/sclera: Conjunctivae normal.     Pupils: Pupils are equal, round, and reactive to light.  Cardiovascular:     Rate and Rhythm: Tachycardia present.     Heart sounds: Murmur (systolic LLSB)  present.  Pulmonary:     Effort: Pulmonary effort is normal.     Breath sounds: Normal breath sounds. No wheezing or rales.  Abdominal:     General: Bowel sounds are normal. There is no distension.     Palpations: Abdomen is soft.  Musculoskeletal:        General: No swelling.  Skin:    General: Skin is warm and dry.     Capillary Refill: Capillary refill takes less than 2 seconds.     Comments: Small pearly nodular rash across face consistent with molluscum   Neurological:     Mental Status: She is oriented to person, place, and time.  Psychiatric:        Mood and Affect: Mood normal.     Lab  Results Lab Results  Component Value Date   WBC 13.1 (H) 05/31/2018   HGB 7.7 (L) 05/31/2018   HCT 23.3 (L) 05/31/2018   MCV 99.6 05/31/2018   PLT 60 (L) 05/31/2018    Lab Results  Component Value Date   CREATININE 0.69 05/30/2018   BUN 9 05/30/2018   NA 134 (L) 05/30/2018   K 3.7 05/30/2018   CL 111 05/30/2018   CO2 16 (L) 05/30/2018    Lab Results  Component Value Date   ALT 31 05/28/2018   AST 44 (H) 05/28/2018   ALKPHOS 180 (H) 05/28/2018   BILITOT 1.2 05/28/2018    Lab Results  Component Value Date   CHOL 56 04/02/2018   HDL <5 (L) 04/02/2018   [place]  04/02/2018     Comment:     Result not calculated because one or more required values exceed analytical limits. Reference range: <100 . Desirable range <100 mg/dL for primary prevention;   <70 mg/dL for patients with CHD or diabetic patients  with > or = 2 CHD risk factors. Marland Kitchen LDL-C is now calculated using the Martin-Hopkins  calculation, which is a validated novel method providing  better accuracy than the Friedewald equation in the  estimation of LDL-C.  Cresenciano Genre et al. Annamaria Helling. 4008;676(19): 2061-2068  (http://education.[employer].com/faq/FAQ164)    TRIG 312 (H) 05/28/2018   CHOLHDL  04/02/2018     Comment:     Result not calculated because one or more required values exceed analytical limits.     HIV 1 RNA Quant (copies/mL)  Date Value  04/02/2018 <20 DETECTED (A)  12/17/2017 26 (H)  09/13/2017 40 (H)   CD4 T Cell Abs (/uL)  Date Value  04/02/2018 10 (L)  12/17/2017 50 (L)  09/13/2017 70 (L)     Assessment & Plan:   Problem List Items Addressed This Visit      Unprioritized   Tachycardia    Systolic blood pressure less than 90 today with mild tachycardia.  She wants to start the fluconazole to treat her thrush and rehydrate with oral fluids if possible.  She will come back to see me at the beginning of next week for close follow-up.  Advised strict precautions to return to urgent care or ER to receive intravenous hydration provided and discussed today. She is severely anemic with hemoglobin of 7.7.  This is chronic however may benefit from a blood transfusion as she has been rehydrated with intravenous fluids multiple times.  We will repeat CBC on Monday.       Esophageal candidiasis (HCC)    Will prescribe 2-week course of fluconazole 200 mg daily.  She can extend this out to 3 weeks if needed.      Relevant Medications   fluconazole (DIFLUCAN) 200 MG tablet   AIDS ([place])    She has been adherent to her [place] however has ongoing findings consistent with AIDS defining illnesses with oral thrush, PCP pneumonia and CD4 counts that have remained below 100.  I would like for her to resume her azithromycin once weekly.  If she does have an underlying diagnosis of sarcoidosis I wonder if this is contributing to her ongoing low CD4 as well.  Discussed today that she may take quite a while to rebound given all that is going on. She has regular follow-up scheduled with Marya Amsler in June however I plan on seeing her back next week for a check-in and moving her appointment up sooner to reevaluate at the end of  her pneumonia treatment to repeat a chest x-ray and ensure she is doing well off steroids.      Relevant Medications   fluconazole (DIFLUCAN) 200 MG tablet   Pneumonia of both  lungs due to Pneumocystis jirovecii (HCC)   Relevant Medications   fluconazole (DIFLUCAN) 200 MG tablet   Pneumocystis jiroveci pneumonia ([place])    She is not hypoxic and currently on day of treatment.  She will continue her Bactrim and prednisone for 21-day course.  Would like to see her again at the end of treatment to repeat her chest x-ray and follow-up how she is feeling.  May need to repeat steroids for her given her very low CD4 count.      Relevant Medications   fluconazole (DIFLUCAN) 200 MG tablet   Shortness of breath - Primary    She is not hypoxic on exam today and is breathing normally with normal lung assessment.  This is multifactorial considering her presumed PCP diagnosis, sarcoidosis, profound anemia.  Return precautions to emergency room were discussed today.         Janene Madeira, MSN, NP-C [facility] [place] Pager: [phone] Office: [phone]  06/05/18  5:14 PM

## 2018-06-05 NOTE — Assessment & Plan Note (Signed)
Will prescribe 2-week course of fluconazole 200 mg daily.  She can extend this out to 3 weeks if needed.

## 2018-06-05 NOTE — Patient Instructions (Addendum)
Very nice to meet you today.  I your lungs sound great.  I do not hear anything concerning on exam today.  Your blood oxygen was 100% which is excellent.  You will continue to slowly feel improvement during the course of treatment for this kind of pneumonia.  I think your fatigue is not just due to this current infection.  Your blood levels are quite low.  The other word for this is anemia.  This may be in part due to a mild deficiency and iron but I think is overall likely related to your HIV.  This is also contributing to your shortness of breath doing activities you once were okay doing.  Your blood pressure is a bit low today.  I would like to see you back early next week to check your blood pressure again.  We may need to give you some IV fluids at this time.  We will probably also repeat your hemoglobin and potassium levels to make sure you are doing okay.   Please however if you feel worse and are unable to keep fluids down, feel like your heart rate is beating out of your chest, getting very dizzy when you sit or stand I want you to consider going to urgent care or the emergency room to get IV fluids.  For your thrush I will give you 2 weeks of fluconazole.  Continue to take this until swallowing is no longer painful and then at least 3 more days beyond this.  If you find that you need a longer course we can extend it out for 3 weeks total.   Anemia  Anemia is a condition in which you do not have enough red blood cells or hemoglobin. Hemoglobin is a substance in red blood cells that carries oxygen. When you do not have enough red blood cells or hemoglobin (are anemic), your body cannot get enough oxygen and your organs may not work properly. As a result, you may feel very tired or have other problems. What are the causes? Common causes of anemia include:  Excessive bleeding. Anemia can be caused by excessive bleeding inside or outside the body, including bleeding from the intestine or  from periods in women.  Poor nutrition.  Long-lasting (chronic) kidney, thyroid, and liver disease.  Bone marrow disorders.  Cancer and treatments for cancer.  HIV (human immunodeficiency virus) and AIDS (acquired immunodeficiency syndrome).  Treatments for HIV and AIDS.  Spleen problems.  Blood disorders.  Infections, medicines, and autoimmune disorders that destroy red blood cells. What are the signs or symptoms? Symptoms of this condition include:  Minor weakness.  Dizziness.  Headache.  Feeling heartbeats that are irregular or faster than normal (palpitations).  Shortness of breath, especially with exercise.  Paleness.  Cold sensitivity.  Indigestion.  Nausea.  Difficulty sleeping.  Difficulty concentrating. Symptoms may occur suddenly or develop slowly. If your anemia is mild, you may not have symptoms. How is this diagnosed? This condition is diagnosed based on:  Blood tests.  Your medical history.  A physical exam.  Bone marrow biopsy. Your health care provider may also check your stool (feces) for blood and may do additional testing to look for the cause of your bleeding. You may also have other tests, including:  Imaging tests, such as a CT scan or MRI.  Endoscopy.  Colonoscopy. How is this treated? Treatment for this condition depends on the cause. If you continue to lose a lot of blood, you may need to be treated  at a hospital. Treatment may include:  Taking supplements of iron, vitamin E33, or folic acid.  Taking a hormone medicine (erythropoietin) that can help to stimulate red blood cell growth.  Having a blood transfusion. This may be needed if you lose a lot of blood.  Making changes to your diet.  Having surgery to remove your spleen. Follow these instructions at home:  Take over-the-counter and prescription medicines only as told by your health care provider.  Take supplements only as told by your health care provider.   Follow any diet instructions that you were given.  Keep all follow-up visits as told by your health care provider. This is important. Contact a health care provider if:  You develop new bleeding anywhere in the body. Get help right away if:  You are very weak.  You are short of breath.  You have pain in your abdomen or chest.  You are dizzy or feel faint.  You have trouble concentrating.  You have bloody or black, tarry stools.  You vomit repeatedly or you vomit up blood. Summary  Anemia is a condition in which you do not have enough red blood cells or enough of a substance in your red blood cells that carries oxygen (hemoglobin).  Symptoms may occur suddenly or develop slowly.  If your anemia is mild, you may not have symptoms.  This condition is diagnosed with blood tests as well as a medical history and physical exam. Other tests may be needed.  Treatment for this condition depends on the cause of the anemia. This information is not intended to replace advice given to you by your health care provider. Make sure you discuss any questions you have with your health care provider. Document Released: 03/02/2004 Document Revised: 02/25/2016 Document Reviewed: 02/25/2016 Elsevier Interactive Patient Education  2019 [employer].   Sarcoidosis  Sarcoidosis is a disease that can cause inflammation in many areas of the body. It most often affects the lungs (pulmonary sarcoidosis). Sarcoidosis can also affect the lymph nodes, liver, eyes, skin, heart, or any other body tissue. Normally, cells that are part of your body's disease-fighting system (immune system) attack harmful substances (such as germs) in your body. This immune system response causes inflammation. After the harmful substance is destroyed, the inflammation and the immune cells go away. When you have sarcoidosis, your immune system causes inflammation even when there are no harmful substances, and the inflammation does  not go away. Sarcoidosis also causes cells from your immune system to form small clumps of tissue (granulomas) in the affected area of your body. What are the causes? The exact cause of sarcoidosis is not known.  It is possible that if you have a family history of this disease (genetic predisposition), the immune system response that leads to inflammation may be triggered by something in your environment, such as:  Bacteria or viruses.  Metals.  Chemicals.  Dust.  Mold or mildew. What increases the risk? You may be at a greater risk for sarcoidosis if you:  Have a family history of the disease.  Are African-American.  Are of Northern European descent.  Are 78-78 years old.  Work as a Airline pilot.  Work in an environment where you are exposed to metals, chemicals, mold or mildew, or insecticides. What are the signs or symptoms? Some people with sarcoidosis have no symptoms. Others have very mild symptoms. The symptoms usually depend on the organ that is affected. Sarcoidosis most often affects the lungs, which may include symptoms such  as:  Chest pain.  Coughing.  Wheezing.  Shortness of breath. Other common symptoms include:  Night sweats.  Fever.  Weight loss.  Fatigue.  Swollen lymph nodes.  Joint pain. How is this diagnosed? Sarcoidosis may be diagnosed based on:  Your symptoms and medical history.  A physical exam.  Imaging tests to check for granulomas such as: ? Chest X-ray. ? CT scan. ? MRI. ? PET scan.  Lung function tests. These tests evaluate your breathing and check for problems that may be related to sarcoidosis.  A procedure to remove a tissue sample for testing (biopsy). You may have a biopsy of lung tissue if that is where you are having symptoms. You may have tests to check for any complications of the condition. These tests may include:  Eye exams.  MRI of the heart or brain.  Echocardiogram.  Electrocardiogram (EKG or ECG).  How is this treated? In some cases, sarcoidosis does not require a specific treatment because it causes no symptoms or mild symptoms. If your symptoms bother you or are severe, you may be prescribed medicines to reduce inflammation or relieve symptoms. These medicines may include:  Prednisone. This is a steroid that reduces inflammation related to sarcoidosis.  Hydroxychloroquine. This may be used to treat sarcoidosis that affects the skin, eyes, or brain.  Methotrexate, leflunomide, or azathioprine. These medicines affect the immune system and can help with sarcoidosis in the joints, eyes, skin, or lungs.  Medicines that you breathe in (inhalers). Inhalers can help you breathe if sarcoidosis affects your lungs. Follow these instructions at home:   Do not use any products that contain nicotine or tobacco, such as cigarettes and e-cigarettes. If you need help quitting, ask your health care provider.  Avoid secondhand smoke and irritating dust or chemicals. Stay indoors on days when air quality is poor in your area.  Return to your normal activities as told by your health care provider. Ask your health care provider what activities are safe for you.  Take or use over-the-counter and prescription medicines only as told by your health care provider.  Keep all follow-up visits as told by your health care provider. This is important. Contact a health care provider if:  You have vision problems.  You have a dry cough that does not go away.  You have an irregular heartbeat.  You have pain or aches in your joints, hands, or feet.  You have an unexplained rash. Get help right away if:  You have chest pain.  You have difficulty breathing. Summary  Sarcoidosis is a disease that can cause inflammation in many body areas of the body. It most often affects the lungs (pulmonary sarcoidosis). It can also affect the lymph nodes, liver, eyes, skin, heart, or any other body tissue.  When you  have sarcoidosis, cells from your immune system form small clumps of tissue (granulomas) in the affected area of your body.  Sarcoidosis sometimes does not require a specific treatment because it causes no symptoms or mild symptoms.  If your symptoms bother you or are severe, you may be prescribed medicines to reduce inflammation or relieve symptoms. This information is not intended to replace advice given to you by your health care provider. Make sure you discuss any questions you have with your health care provider. Document Released: 11/24/2003 Document Revised: 10/31/2016 Document Reviewed: 10/31/2016 Elsevier Interactive Patient Education  2019 [employer].

## 2018-06-06 ENCOUNTER — Telehealth: Payer: Self-pay | Admitting: Obstetrics & Gynecology

## 2018-06-06 ENCOUNTER — Telehealth: Payer: Self-pay | Admitting: Infectious Diseases

## 2018-06-06 NOTE — Telephone Encounter (Signed)
I spoke with Ms. Sample and explained that she will need a CKC. Drema Balzarine will schedule this.

## 2018-06-06 NOTE — Telephone Encounter (Signed)
COVID-19 Pre-Screening Questions: ° °Do you currently have a fever (>100 °F), chills or unexplained body aches? No  ° °Are you currently experiencing new cough, shortness of breath, sore throat, runny nose? No  °•  °Have you recently travelled outside the state of [place] in the last 14 days? No  °•  °1. Have you been in contact with someone that is currently pending confirmation of Covid19 testing or has been confirmed to have the Covid19 virus?  No  ° °

## 2018-06-10 ENCOUNTER — Other Ambulatory Visit: Payer: Self-pay

## 2018-06-10 ENCOUNTER — Ambulatory Visit (INDEPENDENT_AMBULATORY_CARE_PROVIDER_SITE_OTHER): Payer: 59 | Admitting: Infectious Diseases

## 2018-06-10 ENCOUNTER — Encounter: Payer: Self-pay | Admitting: Infectious Diseases

## 2018-06-10 VITALS — BP 90/63 | HR 97 | Temp 97.4°F | Wt 112.0 lb

## 2018-06-10 DIAGNOSIS — D649 Anemia, unspecified: Secondary | ICD-10-CM | POA: Insufficient documentation

## 2018-06-10 DIAGNOSIS — R87610 Atypical squamous cells of undetermined significance on cytologic smear of cervix (ASC-US): Secondary | ICD-10-CM | POA: Diagnosis not present

## 2018-06-10 DIAGNOSIS — D696 Thrombocytopenia, unspecified: Secondary | ICD-10-CM | POA: Insufficient documentation

## 2018-06-10 DIAGNOSIS — Z5181 Encounter for therapeutic drug level monitoring: Secondary | ICD-10-CM

## 2018-06-10 DIAGNOSIS — R87810 Cervical high risk human papillomavirus (HPV) DNA test positive: Secondary | ICD-10-CM | POA: Diagnosis not present

## 2018-06-10 DIAGNOSIS — B59 Pneumocystosis: Secondary | ICD-10-CM | POA: Diagnosis not present

## 2018-06-10 DIAGNOSIS — B20 Human immunodeficiency virus [HIV] disease: Secondary | ICD-10-CM | POA: Diagnosis not present

## 2018-06-10 DIAGNOSIS — B3781 Candidal esophagitis: Secondary | ICD-10-CM

## 2018-06-10 MED ORDER — SODIUM CHLORIDE 0.9 % IV SOLN
Freq: Once | INTRAVENOUS | Status: AC
  Administered 2018-06-10: 15:00:00 via INTRAVENOUS

## 2018-06-10 NOTE — Progress Notes (Signed)
RN received order for Peripheral IV with bolus of 1L normal saline.  22 gauge IV started in right antecubital, labs obtained.  Intravenous fluids were administered per [place], started at 1430.  IV infusion complete at 1605, patient reports feeling better, dizziness resolved.  PIV discontinued, catheter tip intact.   Landis Gandy, RN

## 2018-06-10 NOTE — Progress Notes (Addendum)
Name: Krystal Short  DOB: 07-28-1985 MRN: [id number] PCP: [name]    Patient Active Problem List   Diagnosis Date Noted  . Symptomatic anemia 06/10/2018  . Thrombocytopenia ([place]) 06/10/2018  . ASCUS with positive high risk HPV cervical 06/10/2018  . Pneumonia 05/28/2018  . Pneumocystis jiroveci pneumonia ([place]) 03/08/2018  . Medication monitoring encounter 12/31/2017  . Cough 08/15/2017  . Molluscum contagiosum 06/27/2017  . AIDS ([place])   . 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 ([place])   . Esophageal candidiasis ([place])   . Tachycardia 06/03/2017     Subjective:  CC: 5-day follow-up for hypotension tachycardia and anemia.   HPI: Krystal Short is here for 5-day follow-up after being seen last week for hospital follow-up and found to be hypotensive and tachycardic.  Her thrush has resolved with taking her fluconazole and she has been able to hold fluids down.  She has struggled with some nausea however.  She is taking Zofran with decent relief.  Her husband is working today and she drove herself to her appointment.  She does have some ongoing dizziness with standing.  She feels that she has spent most of the weekend asleep at home.  Has ongoing weakness and malaise.  No fevers, chills, cough, shortness of breath.  Otherwise no new complaints.  She has about 8 days left to complete her treatment for presumed PJP.  Down to 20 mg of prednisone daily.  She has required blood transfusion in the past for symptomatic anemia.  She is open to receiving another one if it will help her feel better.   Depression screen [facility] 2/9 06/05/2018  Decreased Interest 0  Down, Depressed, Hopeless 0  PHQ - 2 Score 0  Altered sleeping -  Tired, decreased energy -  Change in appetite -  Feeling bad or failure about yourself  -  Trouble concentrating -  Moving slowly or fidgety/restless -  Suicidal thoughts -  PHQ-9 Score -    Review of Systems  Constitutional:  Positive for malaise/fatigue. Negative for chills, fever and weight loss.  HENT: Negative for congestion, sinus pain and sore throat.   Respiratory: Negative for cough, hemoptysis and shortness of breath.   Cardiovascular: Negative for chest pain, orthopnea and leg swelling.  Gastrointestinal: Positive for nausea. Negative for abdominal pain, diarrhea and vomiting.  Genitourinary: Negative for dysuria.  Musculoskeletal: Negative for joint pain and myalgias.  Skin: Positive for rash (face).  Neurological: Positive for dizziness and weakness. Negative for headaches.    Past Medical History:  Diagnosis Date  . Acute hyponatremia 06/03/2017  . CAP (community acquired pneumonia) 06/03/2017  . Community acquired pneumonia 06/05/2017  . Facial dermatitis 06/03/2017  . HIV (human immunodeficiency virus infection) ([place])   . Pleurisy 06/03/2017  . Pneumonia 06/06/2017  . 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 ([place])   . Thrush of mouth and esophagus ([place])   . UTI (urinary tract infection)     Outpatient Medications Prior to Visit  Medication Sig Dispense Refill  . acetaminophen (TYLENOL) 325 MG tablet Take 650 mg by mouth every 6 (six) hours as needed for mild pain or headache.    . bictegravir-emtricitabine-tenofovir AF (BIKTARVY) 50-200-25 MG TABS tablet Take 1 tablet by mouth daily. 30 tablet 3  . fluconazole (DIFLUCAN) 200 MG tablet Take 1 tablet (200 mg total) by mouth daily. 14 tablet 0  . imiquimod (ALDARA) 5 % cream Apply 1 application topically See admin instructions. Every 3 days    .  ondansetron (ZOFRAN) 4 MG tablet Take 1 tablet (4 mg total) by mouth every 6 (six) hours. 12 tablet 0  . predniSONE (DELTASONE) 20 MG tablet Take 2 tablets (40 mg total) by mouth 2 (two) times daily. 10 tablet 0  . sulfamethoxazole-trimethoprim (BACTRIM DS) 800-160 MG tablet Take 2 tablets by mouth every 8 (eight) hours for 19 days. 114 tablet 0   No facility-administered medications prior to  visit.      Allergies  Allergen Reactions  . Heparin     Social History   Tobacco Use  . Smoking status: Former Smoker    Types: Cigarettes    Last attempt to quit: 12/25/2016    Years since quitting: 1.4  . Smokeless tobacco: Never Used  Substance Use Topics  . Alcohol use: Yes    Comment: weekend/socially  . Drug use: No    Family History  Problem Relation Age of Onset  . Healthy Father   . Healthy Mother     Social History   Substance and Sexual Activity  Sexual Activity Yes  . Birth control/protection: Injection     Objective:   Vitals:   06/10/18 1345 06/10/18 1432 06/10/18 1605  BP: 110/79 110/80 90/63  Pulse: (!) 140 (!) 104 97  Temp: (!) 97.4 F (36.3 C)    SpO2: 100% 98% 100%  Weight: 112 lb (50.8 kg)     Body mass index is 20.82 kg/m.  Physical Exam Constitutional:      Appearance: She is ill-appearing.     Comments: Krystal Short appears tired/fatigued and weak today.  She is seated comfortably in the chair and is able to move without assistance.  HENT:     Mouth/Throat:     Pharynx: No oropharyngeal exudate.     Comments: Thrush resolved Eyes:     General: No scleral icterus.    Pupils: Pupils are equal, round, and reactive to light.     Comments: Conjunctiva pale  Cardiovascular:     Rate and Rhythm: Tachycardia present.     Heart sounds: Murmur (systolic LLSB) present.  Pulmonary:     Effort: Pulmonary effort is normal.     Breath sounds: Normal breath sounds. No wheezing or rales.  Abdominal:     General: Bowel sounds are normal. There is no distension.     Palpations: Abdomen is soft.  Musculoskeletal:        General: No swelling.  Skin:    General: Skin is warm and dry.     Capillary Refill: Capillary refill takes less than 2 seconds.     Comments: Small pearly nodular rash across face consistent with molluscum   Neurological:     Mental Status: She is oriented to person, place, and time.  Psychiatric:        Mood and Affect:  Mood normal.     Lab Results Lab Results  Component Value Date   WBC 13.1 (H) 05/31/2018   HGB 7.7 (L) 05/31/2018   HCT 23.3 (L) 05/31/2018   MCV 99.6 05/31/2018   PLT 60 (L) 05/31/2018    Lab Results  Component Value Date   CREATININE 0.69 05/30/2018   BUN 9 05/30/2018   NA 134 (L) 05/30/2018   K 3.7 05/30/2018   CL 111 05/30/2018   CO2 16 (L) 05/30/2018    Lab Results  Component Value Date   ALT 31 05/28/2018   AST 44 (H) 05/28/2018   ALKPHOS 180 (H) 05/28/2018   BILITOT 1.2 05/28/2018  Lab Results  Component Value Date   CHOL 56 04/02/2018   HDL <5 (L) 04/02/2018   Krystal Short  04/02/2018     Comment:     Result not calculated because one or more required values exceed analytical limits. Reference range: <100 . Desirable range <100 mg/dL for primary prevention;   <70 mg/dL for patients with CHD or diabetic patients  with > or = 2 CHD risk factors. Marland Kitchen LDL-C is now calculated using the Martin-Hopkins  calculation, which is a validated novel method providing  better accuracy than the Friedewald equation in the  estimation of LDL-C.  Cresenciano Genre et al. Annamaria Helling. 0350;093(81): 2061-2068  (http://education.[employer].com/faq/FAQ164)    TRIG 312 (H) 05/28/2018   CHOLHDL  04/02/2018     Comment:     Result not calculated because one or more required values exceed analytical limits.    HIV 1 RNA Quant (copies/mL)  Date Value  04/02/2018 <20 DETECTED (A)  12/17/2017 26 (H)  09/13/2017 40 (H)   CD4 T Cell Abs (/uL)  Date Value  04/02/2018 10 (L)  12/17/2017 50 (L)  09/13/2017 70 (L)     Assessment & Plan:   Problem List Items Addressed This Visit      Unprioritized   AIDS ([place]) - Primary    She will continue her Biktarvy.  Continue prophylactic Bactrim as indicated below.      Relevant Medications   0.9 %  sodium chloride infusion (Completed)   ASCUS with positive high risk HPV cervical    ASCUS suggesting CIN-2 with positive HPV on Pap smear.   Awaiting cervical biopsy with upcoming procedure in July.       Esophageal candidiasis ([place])    She will finish out 2 weeks of fluconazole for presumed esophageal involvement.  Patches in the oropharynx have resolved.      Medication monitoring encounter    We will check BMP to assess potassium and creatinine today considering her high-dose Bactrim.      Relevant Orders   Basic metabolic panel   Pneumocystis jiroveci pneumonia ([place])    She has 8 days left of treatment with Bactrim 2 tablets 3 times a day and is down to prednisone 20 mg once daily.  She has 1 refill to pick up which is the lower dose of the prednisone.  She knows to continue her Bactrim 1 tablet once a day after she completes treatment.  We will have her follow-up at the end of next week with Marya Amsler with plan to repeat her chest x-ray here at [place] [facility].  She is non-hypoxic nontoxic not tachycardic and not short of breath today.      Symptomatic anemia    Her hemoglobin dropped to 6 back in January February of this year.  She has slowly trended back down to 7.1 range.  Considering she has had intravenous hydration at multiple outpatient appointments and hospitalization we discussed transfusion of 1 unit of red blood cells today.  She thinks this is a good idea and will help her feel better as she recalls it did back in February.    We will give her a 500 cc bolus of normal saline in the office today to help with her dizziness and tachycardia until we can get her an appointment for transfusion.  Anemia likely multifactorial and I am sure myelosuppression from HIV is playing a role, however she has had undetectable viral loads since August 2019 and would have expected to see at least a little  bit of improvement by now.  We will set up for transfusion of 1 unit of blood through short stay and refer to hematology for further work-up and management.      Relevant Orders   CBC   Ambulatory referral to Hematology   Insert  peripheral IV   Thrombocytopenia (HCC)    Platelet count recently at 60,000.  She is got no acute signs of active bleeding.  Hematology referral as described above.      Relevant Orders   Ambulatory referral to Hematology     2:26 PM - IV started by Harvin Hazel, RN. Blood drawn. Unable to get T&S in clinic and will need to be done @ [facility].   2:43 PM - HR down to 104 from 140. BP stable. Patient resting comfortably and given warm blankets.   3:00 PM - Patient tolerating infusion well. Increased to 750 cc/h   4:00 PM - HR < 100 after 1L NS. BP still marginal but > 90 SBP.  Unlikely to get transfusion appointment until early next week.  We will see what her CBC results show.  We discussed precautions to go to ER if needed sooner.  60 minutes of face-to-face time was given to the patient today during encounter.  Greater than 80% of the time in direct face-to-face care with the additional in care coordination.  Janene Madeira, MSN, NP-C [facility] [place] Pager: [phone] Office: [phone]  06/10/18  4:30 PM

## 2018-06-10 NOTE — Assessment & Plan Note (Signed)
She will continue her Biktarvy.  Continue prophylactic Bactrim as indicated below.

## 2018-06-10 NOTE — Assessment & Plan Note (Addendum)
ASCUS suggesting CIN-2 with positive HPV on Pap smear.  Awaiting cervical biopsy with upcoming procedure in July.

## 2018-06-10 NOTE — Assessment & Plan Note (Signed)
Platelet count recently at 60,000.  She is got no acute signs of active bleeding.  Hematology referral as described above.

## 2018-06-10 NOTE — Assessment & Plan Note (Signed)
She will finish out 2 weeks of fluconazole for presumed esophageal involvement.  Patches in the oropharynx have resolved.

## 2018-06-10 NOTE — Assessment & Plan Note (Addendum)
Her hemoglobin dropped to 6 back in January February of this year.  She has slowly trended back down to 7.1 range.  Considering she has had intravenous hydration at multiple outpatient appointments and hospitalization we discussed transfusion of 1 unit of red blood cells today.  She thinks this is a good idea and will help her feel better as she recalls it did back in February.    We will give her a 500 cc bolus of normal saline in the office today to help with her dizziness and tachycardia until we can get her an appointment for transfusion.  Anemia likely multifactorial and I am sure myelosuppression from HIV is playing a role, however she has had undetectable viral loads since August 2019 and would have expected to see at least a little bit of improvement by now.  We will set up for transfusion of 1 unit of blood through short stay and refer to hematology for further work-up and management.

## 2018-06-10 NOTE — Assessment & Plan Note (Signed)
She has 8 days left of treatment with Bactrim 2 tablets 3 times a day and is down to prednisone 20 mg once daily.  She has 1 refill to pick up which is the lower dose of the prednisone.  She knows to continue her Bactrim 1 tablet once a day after she completes treatment.  We will have her follow-up at the end of next week with Marya Amsler with plan to repeat her chest x-ray here at [place].  She is non-hypoxic nontoxic not tachycardic and not short of breath today.

## 2018-06-10 NOTE — Patient Instructions (Addendum)
I am starting that you continue to feel weak.  We will give you some IV fluids today in hopes that this perks you up a little bit.  We will repeat your hemoglobin (red blood count).  I do suspect that we will need to get you a unit of blood to help you feel better.  It would be a good idea to get you referred to hematology to help with your ongoing anemia management.  You should complete your treatment next week for the pneumonia we think you have.  After you are done with taking your Bactrim 2 tablets 3 times a day he should continue this 1 tablet once a day until we tell you to stop.  Finish your prednisone 20 mg per day through May 12th then stop.  Please make an appointment for Thursday, May 14 to come back and see Krystal Short so we can follow you closely.

## 2018-06-10 NOTE — Assessment & Plan Note (Signed)
We will check BMP to assess potassium and creatinine today considering her high-dose Bactrim.

## 2018-06-11 ENCOUNTER — Other Ambulatory Visit: Payer: Self-pay

## 2018-06-11 LAB — BASIC METABOLIC PANEL
BUN/Creatinine Ratio: 18 (calc) (ref 6–22)
BUN: 23 mg/dL (ref 7–25)
CO2: 14 mmol/L — ABNORMAL LOW (ref 20–32)
Calcium: 9 mg/dL (ref 8.6–10.2)
Chloride: 95 mmol/L — ABNORMAL LOW (ref 98–110)
Creat: 1.26 mg/dL — ABNORMAL HIGH (ref 0.50–1.10)
Glucose, Bld: 81 mg/dL (ref 65–99)
Potassium: 5.3 mmol/L (ref 3.5–5.3)
Sodium: 124 mmol/L — ABNORMAL LOW (ref 135–146)

## 2018-06-11 LAB — CBC
HCT: 24.9 % — ABNORMAL LOW (ref 35.0–45.0)
Hemoglobin: 8.8 g/dL — ABNORMAL LOW (ref 11.7–15.5)
MCH: 34.4 pg — ABNORMAL HIGH (ref 27.0–33.0)
MCHC: 35.3 g/dL (ref 32.0–36.0)
MCV: 97.3 fL (ref 80.0–100.0)
MPV: 11.8 fL (ref 7.5–12.5)
Platelets: 239 10*3/uL (ref 140–400)
RBC: 2.56 10*6/uL — ABNORMAL LOW (ref 3.80–5.10)
RDW: 19.5 % — ABNORMAL HIGH (ref 11.0–15.0)
WBC: 23.7 10*3/uL — ABNORMAL HIGH (ref 3.8–10.8)

## 2018-06-12 ENCOUNTER — Inpatient Hospital Stay: Payer: 59 | Attending: Oncology | Admitting: Oncology

## 2018-06-12 ENCOUNTER — Telehealth: Payer: Self-pay

## 2018-06-12 ENCOUNTER — Encounter: Payer: Self-pay | Admitting: Oncology

## 2018-06-12 DIAGNOSIS — Z79899 Other long term (current) drug therapy: Secondary | ICD-10-CM | POA: Insufficient documentation

## 2018-06-12 DIAGNOSIS — Z21 Asymptomatic human immunodeficiency virus [HIV] infection status: Secondary | ICD-10-CM

## 2018-06-12 DIAGNOSIS — D649 Anemia, unspecified: Secondary | ICD-10-CM

## 2018-06-12 DIAGNOSIS — Z87891 Personal history of nicotine dependence: Secondary | ICD-10-CM | POA: Insufficient documentation

## 2018-06-12 DIAGNOSIS — Z8701 Personal history of pneumonia (recurrent): Secondary | ICD-10-CM

## 2018-06-12 DIAGNOSIS — Z888 Allergy status to other drugs, medicaments and biological substances status: Secondary | ICD-10-CM | POA: Insufficient documentation

## 2018-06-12 DIAGNOSIS — B20 Human immunodeficiency virus [HIV] disease: Secondary | ICD-10-CM | POA: Insufficient documentation

## 2018-06-12 DIAGNOSIS — D696 Thrombocytopenia, unspecified: Secondary | ICD-10-CM | POA: Diagnosis not present

## 2018-06-12 DIAGNOSIS — D72829 Elevated white blood cell count, unspecified: Secondary | ICD-10-CM | POA: Insufficient documentation

## 2018-06-12 DIAGNOSIS — N179 Acute kidney failure, unspecified: Secondary | ICD-10-CM

## 2018-06-12 NOTE — Addendum Note (Signed)
Addended by: [place] Callas on: 06/12/2018 05:08 PM   Modules accepted: Orders

## 2018-06-12 NOTE — Telephone Encounter (Signed)
-----   Message from [place] Callas, NP sent at 06/12/2018  4:23 PM EDT ----- Please call Krystal Short to get her scheduled for a repeat lab apt Thursday 5/7 so we can make sure her kidney function is getting better and not worse. It increased a little. I am hoping that the IV fluids helped this. And actually her hemoglobin is improved so I think she can avoid a  Blood transfusion for now.

## 2018-06-12 NOTE — Progress Notes (Signed)
Please call Krystal Short to get her scheduled for a repeat lab apt Thursday 5/7 so we can make sure her kidney function is getting better and not worse. It increased a little. I am hoping that the IV fluids helped this. And actually her hemoglobin is improved so I think she can avoid a  Blood transfusion for now.

## 2018-06-12 NOTE — Telephone Encounter (Signed)
Called Krystal Short to relay results per Krystal Madeira NP and to schedule lab appointment for tomorrow 06/13/2018 to recheck kidney function.  Krystal Short confirmed identity, and understood instructions with feedback. Will come in 06/13/2018 at [facility] for labs.

## 2018-06-12 NOTE — Progress Notes (Signed)
Pt states she had had pneumonia several times and she has HIV. She can't remember having any plt issues before now. She is on atb and normally she takes 1 pill a day but because of pneumonia she is taking 2 tablets tid. now

## 2018-06-12 NOTE — Progress Notes (Signed)
She has had some elevation in her creatinine likely due to dehydration in the setting of high-dose Bactrim for presumed PJP treatment.  We rehydrated her in the clinic on the day the labs were drawn and will have her come by tomorrow to repeat her kidney function.  Her anemia appears to have improved with hemoglobin 8.8.  This may be concentrated due to dehydration.  We will repeat this as well to determine need for transfusion Monday as currently scheduled.  If her creatinine remains elevated will likely end her therapy a few days early and transition to dapsone once daily for secondary prophylaxis and plan to repeat a chest x-ray with follow-up next week.

## 2018-06-12 NOTE — Addendum Note (Signed)
Addended by: [place] Callas on: 06/12/2018 04:59 PM   Modules accepted: Orders

## 2018-06-13 ENCOUNTER — Other Ambulatory Visit: Payer: Self-pay

## 2018-06-13 ENCOUNTER — Other Ambulatory Visit: Payer: 59

## 2018-06-13 ENCOUNTER — Telehealth: Payer: Self-pay | Admitting: General Practice

## 2018-06-13 DIAGNOSIS — D649 Anemia, unspecified: Secondary | ICD-10-CM

## 2018-06-13 DIAGNOSIS — N179 Acute kidney failure, unspecified: Secondary | ICD-10-CM

## 2018-06-13 DIAGNOSIS — B20 Human immunodeficiency virus [HIV] disease: Secondary | ICD-10-CM

## 2018-06-13 NOTE — Telephone Encounter (Signed)
Called and notified pt of Virtual visit with Dr. Hulan Fray to discuss surgery on 07/11/2018 at 0855.  Webex info given with provider Meeting number and info sent to [place] as well. Pt verbalized understanding.

## 2018-06-14 ENCOUNTER — Telehealth: Payer: Self-pay

## 2018-06-14 ENCOUNTER — Encounter: Payer: Self-pay | Admitting: Oncology

## 2018-06-14 LAB — BASIC METABOLIC PANEL
BUN: 17 mg/dL (ref 7–25)
CO2: 19 mmol/L — ABNORMAL LOW (ref 20–32)
Calcium: 9 mg/dL (ref 8.6–10.2)
Chloride: 98 mmol/L (ref 98–110)
Creat: 1.06 mg/dL (ref 0.50–1.10)
Glucose, Bld: 134 mg/dL — ABNORMAL HIGH (ref 65–99)
Potassium: 5.7 mmol/L — ABNORMAL HIGH (ref 3.5–5.3)
Sodium: 126 mmol/L — ABNORMAL LOW (ref 135–146)

## 2018-06-14 LAB — HEMOGLOBIN AND HEMATOCRIT, BLOOD
HCT: 24 % — ABNORMAL LOW (ref 35.0–45.0)
Hemoglobin: 8.4 g/dL — ABNORMAL LOW (ref 11.7–15.5)

## 2018-06-14 LAB — GLUCOSE 6 PHOSPHATE DEHYDROGENASE: G-6PDH: 20.6 U/g Hgb — ABNORMAL HIGH (ref 7.0–20.5)

## 2018-06-14 MED ORDER — DAPSONE 100 MG PO TABS: 100.0000 mg | ORAL_TABLET | Freq: Every day | ORAL | 0 refills | Status: DC

## 2018-06-14 NOTE — Telephone Encounter (Signed)
Pt informed to stop Bactrim and she will be advised next week on when to add back into regimen.  For now take Dapsone, which will be sent to pharmacy.   Her blood transfusion with sickle cell has been cancelled.   Dapsone 100 mg added and sent to pharmacy.    Laverle Patter, RN

## 2018-06-14 NOTE — Progress Notes (Signed)
I connected with Krystal Short on 06/14/18 at 10:30 AM EDT by video enabled telemedicine visit and verified that I am speaking with the correct person using two identifiers.   I discussed the limitations, risks, security and privacy concerns of performing an evaluation and management service by telemedicine and the availability of in-person appointments. I also discussed with the patient that there may be a patient responsible charge related to this service. The patient expressed understanding and agreed to proceed.  Other persons participating in the visit and their role in the encounter:  none  Patient's location:  home Provider's location:  home  Reason for referral: Anemia/thrombocytopenia Referring provider: Dr. Doren Custard  History of present illness: Patient is a 33 year old African-American female with a history of HIV which is well controlled. She follows up with infectious disease for the same.  Her CD4 counts have remained below 100 and she has been having recurrent episodes of pneumonia over the last 3 months.  She was recently discharged from the hospital on 05/31/2018 for the same.  She is currently on steroids and Bactrim.She also underwent CT chest without contrast during that admission which showed multiple enlarged mediastinal and bilateral hilar lymph nodes.  Diffuse thickening of the peribronchovascular interstitium and diffuse thickening of fissures subpleural thickening noted these findings are diffuse throughout the lungs and most severe involvement of the mid to upper lungs most suggestive of sarcoidosis.  Patient was seen by Dr. Tawanna Solo from pulmonary and has an appointment with him next week.  She has been referred to [place] for anemia and thrombocytopenia.  Her most recent CBC from 06/10/2018 showed white count of 23.7, H&H of 8.8/24.9 and a platelet count of 239.  This was when she was on prednisone.  Looking back at her prior CBCs patient had a normal white count of 28 March 2018  and a normal platelet count.  When she was admitted to the hospital for pneumonia she had transient thrombocytopenia when her platelet counts dropped to60s and her white count increased to 13.  With regards to anemia patient has had chronic mild normocytic anemia and her hemoglobin fluctuates between 9-11.  Currently patient reports fatigue and some chronic cough.  Appetite is stable and she denies any unintentional weight loss.  Denies any lumps or bumps anywhere.   Review of Systems  Constitutional: Positive for malaise/fatigue. Negative for chills, fever and weight loss.  HENT: Negative for congestion, ear discharge and nosebleeds.   Eyes: Negative for blurred vision.  Respiratory: Positive for cough. Negative for hemoptysis, sputum production, shortness of breath and wheezing.   Cardiovascular: Negative for chest pain, palpitations, orthopnea and claudication.  Gastrointestinal: Negative for abdominal pain, blood in stool, constipation, diarrhea, heartburn, melena, nausea and vomiting.  Genitourinary: Negative for dysuria, flank pain, frequency, hematuria and urgency.  Musculoskeletal: Negative for back pain, joint pain and myalgias.  Skin: Negative for rash.  Neurological: Negative for dizziness, tingling, focal weakness, seizures, weakness and headaches.  Endo/Heme/Allergies: Does not bruise/bleed easily.  Psychiatric/Behavioral: Negative for depression and suicidal ideas. The patient does not have insomnia.     Allergies  Allergen Reactions  . Heparin     Past Medical History:  Diagnosis Date  . Acute hyponatremia 06/03/2017  . Anemia   . CAP (community acquired pneumonia) 06/03/2017  . Community acquired pneumonia 06/05/2017  . Facial dermatitis 06/03/2017  . HIV (human immunodeficiency virus infection) ([place])   . Pleurisy 06/03/2017  . Pneumonia 06/06/2017  . Pneumonia of both lungs due to Pneumocystis  jirovecii ([place])   . Thrush of mouth and esophagus ([place])   . UTI (urinary  tract infection)     Past Surgical History:  Procedure Laterality Date  . NO PAST SURGERIES      Social History   Socioeconomic History  . Marital status: Married    Spouse name: Not on file  . Number of children: Not on file  . Years of education: college  . Highest education level: Bachelor's degree (e.g., BA, AB, BS)  Occupational History  . Occupation: [employer]    Employer: [employer]  . Financial resource strain: Not hard at all  . Food insecurity:    Worry: Never true    Inability: Never true  . Transportation needs:    Medical: No    Non-medical: No  Tobacco Use  . Smoking status: Former Smoker    Types: Cigarettes    Last attempt to quit: 12/25/2016    Years since quitting: 1.4  . Smokeless tobacco: Never Used  Substance and Sexual Activity  . Alcohol use: Yes    Comment: weekend/socially  . Drug use: No  . Sexual activity: Not Currently    Birth control/protection: Injection  Lifestyle  . Physical activity:    Days per week: 0 days    Minutes per session: 0 min  . Stress: Not at all  Relationships  . Social connections:    Talks on phone: Once a week    Gets together: Once a week    Attends religious service: More than 4 times per year    Active member of club or organization: No    Attends meetings of clubs or organizations: Never    Relationship status: Married  . Intimate partner violence:    Fear of current or ex partner: No    Emotionally abused: No    Physically abused: No    Forced sexual activity: No  Other Topics Concern  . Not on file  Social History Narrative   Patient does not drive, if her husband cannot provide transportation she takes uses Lyft services   She has not smoked since 2018    Family History  Problem Relation Age of Onset  . Healthy Father   . Healthy Mother      Current Outpatient Medications:  .  acetaminophen (TYLENOL) 325 MG tablet, Take 650 mg by mouth every 6 (six) hours  as needed for mild pain or headache., Disp: , Rfl:  .  bictegravir-emtricitabine-tenofovir AF (BIKTARVY) 50-200-25 MG TABS tablet, Take 1 tablet by mouth daily., Disp: 30 tablet, Rfl: 3 .  ondansetron (ZOFRAN) 4 MG tablet, Take 1 tablet (4 mg total) by mouth every 6 (six) hours. (Patient taking differently: Take 4 mg by mouth every 6 (six) hours as needed. ), Disp: 12 tablet, Rfl: 0 .  predniSONE (DELTASONE) 20 MG tablet, Take 2 tablets (40 mg total) by mouth 2 (two) times daily., Disp: 10 tablet, Rfl: 0 .  sulfamethoxazole-trimethoprim (BACTRIM DS) 800-160 MG tablet, Take 2 tablets by mouth every 8 (eight) hours for 19 days., Disp: 114 tablet, Rfl: 0  Ct Chest Wo Contrast  Result Date: 05/28/2018 CLINICAL DATA:  33 year old female with history of nonproductive cough and body aches with fever and fatigue for the past 5 days. EXAM: CT CHEST WITHOUT CONTRAST TECHNIQUE: Multidetector CT imaging of the chest was performed following the standard protocol without IV contrast. COMPARISON:  Chest CT 06/04/2017. FINDINGS: Cardiovascular: Heart size is normal. There is  no significant pericardial fluid, thickening or pericardial calcification. No atherosclerotic calcifications in the thoracic aorta or the coronary arteries. Mediastinum/Nodes: Multiple enlarged mediastinal and bilateral hilar lymph nodes measuring up to 1.8 cm in short axis in the right paratracheal nodal station. Esophagus is unremarkable in appearance. No axillary lymphadenopathy. Lungs/Pleura: Unusual appearance in the lungs with diffuse thickening of the peribronchovascular interstitium and diffuse thickening of the fissures, all of which appears micronodular. Some subpleural thickening is also noted in the periphery of the lungs bilaterally. These findings are diffuse throughout the lungs, with minimal sparing in the extreme lung bases, and most severe involvement throughout the mid to upper lungs. No confluent consolidative airspace disease. No  pleural effusions. Upper Abdomen: Unremarkable. Musculoskeletal: There are no aggressive appearing lytic or blastic lesions noted in the visualized portions of the skeleton. IMPRESSION: 1. Unusual appearance of the thorax with a spectrum of findings most suggestive of sarcoidosis, as detailed above. Electronically Signed   By: Vinnie Langton M.D.   On: 05/28/2018 18:05   Dg Chest Portable 1 View  Result Date: 05/28/2018 CLINICAL DATA:  Cough, fever. EXAM: PORTABLE CHEST 1 VIEW COMPARISON:  Radiographs of March 08, 2018. FINDINGS: The heart size and mediastinal contours are within normal limits. No pneumothorax or pleural effusion is noted. Increased diffuse reticular densities are noted throughout both lungs, concerning for atypical infection or pneumonia. The visualized skeletal structures are unremarkable. IMPRESSION: Increased bilateral diffuse reticular densities are noted concerning for atypical infection or pneumonia. Electronically Signed   By: Marijo Conception M.D.   On: 05/28/2018 14:43    No images are attached to the encounter.   CMP Latest Ref Rng & Units 06/13/2018  Glucose 65 - 99 mg/dL 134(H)  BUN 7 - 25 mg/dL 17  Creatinine 0.50 - 1.10 mg/dL 1.06  Sodium 135 - 146 mmol/L 126(L)  Potassium 3.5 - 5.3 mmol/L 5.7(H)  Chloride 98 - 110 mmol/L 98  CO2 20 - 32 mmol/L 19(L)  Calcium 8.6 - 10.2 mg/dL 9.0  Total Protein 6.5 - 8.1 g/dL -  Total Bilirubin 0.3 - 1.2 mg/dL -  Alkaline Phos 38 - 126 U/L -  AST 15 - 41 U/L -  ALT 0 - 44 U/L -   CBC Latest Ref Rng & Units 06/13/2018  WBC 3.8 - 10.8 Thousand/uL -  Hemoglobin 11.7 - 15.5 g/dL 8.4(L)  Hematocrit 35.0 - 45.0 % 24.0(L)  Platelets 140 - 400 Thousand/uL -     Observation/objective: Appears in no acute distress over the video visit today.  Breathing is nonlabored  Assessment and plan: Patient is a 33 year old female with a history of HIV and ongoing repeated episodes of pneumonia referred for anemia and  thrombocytopenia  1.  Leukocytosis: Patient has had a normal white count up until February 2020 and her white count has been recently elevated probably secondary to underlying pneumonia and/or steroid use.  Continue to monitor.  2.  Thrombocytopenia: Patient has had no prior history of thrombocytopenia and her platelet counts transiently dropped to the 60s when she was hospitalized for pneumonia.  Her platelet count was normal on 06/10/2018.  I suspect this may be due to his transient myelosuppression from her pneumonia.  Continue to monitor  3.  Normocytic anemia: This may be secondary to her underlying HIV.  I will do a complete anemia work-up including a CBC with differential, CMP, ferritin and iron studies, TSH, reticulocyte count, smear review, myeloma panel and serum free light chains, LDH and Coombs  test.  B12 and folate levels were normal in January 2020.  However since patient is on prednisone presently I would like to wait and get these labs in 10 days time after she comes off prednisone  4.  Diffuse lung abnormalities and mediastinal adenopathy noted on CT scan: Findings suggestive of sarcoidosis.  Patient has a follow-up scheduled with Dr. Tawanna Solo from pulmonary.  She may need bronchoscopy down the line after her pneumonia resolves.  I will follow-up with their recommendations.  Malignancy is less likely in the differential but will need bronchoscopy for definitive diagnosis  Follow-up instructions: Labs after 10 days followed by video visit  I discussed the assessment and treatment plan with the patient. The patient was provided an opportunity to ask questions and all were answered. The patient agreed with the plan and demonstrated an understanding of the instructions.   The patient was advised to call back or seek an in-person evaluation if the symptoms worsen or if the condition fails to improve as anticipated.    Visit Diagnosis: 1. Normocytic anemia   2. Leukocytosis, unspecified  type   3. Thrombocytopenia ([place])     Dr. Randa Evens, MD, MPH [facility] at [facility] [phone] 06/14/2018 9:18 AM

## 2018-06-14 NOTE — Telephone Encounter (Signed)
-----   Message from [place] Callas, NP sent at 06/14/2018  9:02 AM EDT ----- Please call to have her stop her bactrim. Her potassium is too high. Kidney function and blood counts better. Will send in some dapsone for her to take one pill once a day for now to protect her immune system. We will likely resume one bactrim a day next week after her body has a chance to get rid of the extra potassium.  Also please help her cancel appt Monday for blood transfusion at [facility]. I think we can avoid this for now.  Thank you

## 2018-06-17 ENCOUNTER — Ambulatory Visit (HOSPITAL_COMMUNITY): Payer: 59

## 2018-06-18 ENCOUNTER — Other Ambulatory Visit: Payer: Self-pay

## 2018-06-18 ENCOUNTER — Ambulatory Visit: Payer: 59 | Admitting: Internal Medicine

## 2018-06-18 ENCOUNTER — Ambulatory Visit (INDEPENDENT_AMBULATORY_CARE_PROVIDER_SITE_OTHER): Payer: 59

## 2018-06-18 ENCOUNTER — Encounter: Payer: Self-pay | Admitting: Internal Medicine

## 2018-06-18 ENCOUNTER — Ambulatory Visit (INDEPENDENT_AMBULATORY_CARE_PROVIDER_SITE_OTHER): Payer: 59 | Admitting: *Deleted

## 2018-06-18 VITALS — BP 113/74 | HR 105 | Temp 98.3°F | Ht 61.5 in | Wt 125.0 lb

## 2018-06-18 VITALS — BP 102/68 | HR 98 | Temp 98.4°F | Ht 61.5 in | Wt 125.0 lb

## 2018-06-18 DIAGNOSIS — R0609 Other forms of dyspnea: Secondary | ICD-10-CM

## 2018-06-18 DIAGNOSIS — Z3042 Encounter for surveillance of injectable contraceptive: Secondary | ICD-10-CM | POA: Diagnosis not present

## 2018-06-18 DIAGNOSIS — R0600 Dyspnea, unspecified: Secondary | ICD-10-CM | POA: Diagnosis not present

## 2018-06-18 MED ORDER — MEDROXYPROGESTERONE ACETATE 150 MG/ML IM SUSP
150.0000 mg | Freq: Once | INTRAMUSCULAR | Status: AC
  Administered 2018-06-18: 150 mg via INTRAMUSCULAR

## 2018-06-18 NOTE — Patient Instructions (Signed)
Please remember to go to the  x-ray department  for your tests - we will call you with the results when they are available    Please schedule a follow up office visit in 3 weeks, sooner if needed with all active medications in hand

## 2018-06-18 NOTE — Progress Notes (Signed)
   Subjective:  Pt in for Depo Provera injection.    Objective: Nurse visit for birth control  Assessment: Denies any side effects of Depo Provera  Plan: Pt tolerated Depo injection. Depo given left upper outer quadrant.  Next injection due July 28-September 17, 2018.  Reminder card given. Derl Barrow, RN

## 2018-06-18 NOTE — Progress Notes (Signed)
Krystal Short, female    DOB: 03-Jun-1985,     MRN: [id number]   Brief patient profile:  30 yobf  Quit smoking 12/2016 s resp problems but dx with HIV April 2019 presented with pna and breathing never right since so referred to pulmonary clinic 06/18/2018 by Dr   Tawanna Solo s/p admit:   Admit date: 05/28/2018 Discharge date: 05/31/2018  Admitted From: Home Disposition:  Home  Discharge Condition:Stable CODE STATUS:FULL Diet recommendation: Regular  Brief/Interim Summary: Patient is a 33 year old African-American female with a past medical history of HIV, PCP who presents to the emergency department with complaints of fever, shortness of breath, cough. Chest x-ray done on presentation suggestiveof atypical infection. COVID-19 ruled out. Possibility of PCP pneumonia. ID consulted. CT chest showed enlarged lymph nodes.  Patient was started on Bactrim and prednisone for PCP.  Her respiratory status significantly improved.  She is hemodynamically stable for discharge today.  She will be called by pulmonology for follow-up because her CT was concerning for sarcoidosis although there is less possibility.  Following problems were addressed during her hospitalization:  Atypical pneumonia/likely PCP :Respiratory status remains stable.Saturating fine  on room air. No complaints of shortness of breath or cough. Started treatment for PC pneumonia as per ID. On bactrim and prednisone.ID had beenfollowing.  Repeat chest x-ray in 2 to 3 weeks.  Follow-up with pulmonology as an outpatient.   Possible sarcoidosis: No history ofsarcoidosisin the past. CT chest showed enlarged mediastinal and bilateral hilar lymph nodes. Low possibility ofsarcoidosis. I have notified pulmonary team for arranging outpatient follow-up.  HIV/AIDS: Last CD4 count was 10 about 2 months PTA Follows with infectious disease. ID was following.  Thrombocytopenia/Anemia: Most likely associated with HIV  medications. Continue outpatient monitoring.  She has been transfused in the past for anemia.  Hypomagnesemia: Supplemented.  Sinus tachycardia:Much improved.      History of Present Illness  06/18/2018  Pulmonary/ 1st office eval/Krystal Short  Chief Complaint  Patient presents with  . Pulmonary Consult    Referred by Dr. Tawanna Solo. Pt c/o SOB for the past year- with or without exertion. She was slighty SOB walking from lobby to exam room today. She also c/o non prod cough.   Used to be able work out including running for 30 min including p quit smoking/ ran track in HS = 400 meters in  [place] Dyspnea:  Room to room doe Cough: better, was always dry assoc with some hb cc's since on prednisone  Sleep: on side/ bed flat / two pillows, wakes up coughing sev times a night, better p something to drink SABA use: none now/ Proair not helpful in past - no longer using  Prednisone last dose 06/18/2018 better x 50%   No obvious day to day or daytime variability or assoc excess/ purulent sputum or mucus plugs or hemoptysis or cp or chest tightness, subjective wheeze or overt sinus  symptoms.   Sleeping  without nocturnal  or early am exacerbation  of respiratory  c/o's or need for noct saba. Also denies any obvious fluctuation of symptoms with weather or environmental changes or other aggravating or alleviating factors except as outlined above   No unusual exposure hx or h/o childhood pna/ asthma or knowledge of premature birth.  Current Allergies, Complete Past Medical History, Past Surgical History, Family History, and Social History were reviewed in [employer] record.  ROS  The following are not active complaints unless bolded Hoarseness, sore throat, dysphagia, dental problems, itching, sneezing,  nasal congestion or discharge of excess mucus or purulent secretions, ear ache,   fever, chills, sweats, unintended wt loss or wt gain, classically pleuritic or exertional cp,   orthopnea pnd or arm/hand swelling  or leg swelling, presyncope, palpitations, abdominal pain, anorexia, nausea, vomiting, diarrhea  or change in bowel habits or change in bladder habits, change in stools or change in urine, dysuria, hematuria,  rash, arthralgias, visual complaints, headache, numbness, weakness or ataxia or problems with walking or coordination,  change in mood or  memory.          Past Medical History:  Diagnosis Date  . Acute hyponatremia 06/03/2017  . Anemia   . CAP (community acquired pneumonia) 06/03/2017  . Community acquired pneumonia 06/05/2017  . Facial dermatitis 06/03/2017  . HIV (human immunodeficiency virus infection) ([place])   . Pleurisy 06/03/2017  . Pneumonia 06/06/2017  . 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 ([place])   . Thrush of mouth and esophagus ([place])   . UTI (urinary tract infection)     Outpatient Medications Prior to Visit  Medication Sig Dispense Refill  . acetaminophen (TYLENOL) 325 MG tablet Take 650 mg by mouth every 6 (six) hours as needed for mild pain or headache.    . bictegravir-emtricitabine-tenofovir AF (BIKTARVY) 50-200-25 MG TABS tablet Take 1 tablet by mouth daily. 30 tablet 3  . dapsone 100 MG tablet Take 1 tablet (100 mg total) by mouth daily. 30 tablet 0  . ondansetron (ZOFRAN) 4 MG tablet Take 1 tablet (4 mg total) by mouth every 6 (six) hours. (Patient taking differently: Take 4 mg by mouth every 6 (six) hours as needed. ) 12 tablet 0  . predniSONE (DELTASONE) 20 MG tablet Take 2 tablets (40 mg total) by mouth 2 (two) times daily. 10 tablet 0  . sulfamethoxazole-trimethoprim (BACTRIM DS) 800-160 MG tablet Take 2 tablets by mouth every 8 (eight) hours for 19 days. 114 tablet 0      Objective:     BP 102/68 (BP Location: Left Arm, Cuff Size: Normal)   Pulse 98   Temp 98.4 F (36.9 C) (Oral)   Ht 5' 1.5" (1.562 m)   Wt 125 lb (56.7 kg)   SpO2 100%   BMI 23.24 kg/m   SpO2: 100 %  RA   Wt Readings from Last 3  Encounters:  06/18/18 125 lb (56.7 kg)  06/10/18 112 lb (50.8 kg)  06/05/18 120 lb (54.4 kg)     amb pleasant bf nad    HEENT: nl dentition, turbinates bilaterally, and oropharynx. Nl external ear canals without cough reflex   NECK :  without JVD/Nodes/TM/ nl carotid upstrokes bilaterally   LUNGS: no acc muscle use,  Nl contour chest which is clear to A and P bilaterally without cough on insp or exp maneuvers   CV:  RRR  no s3 or murmur or increase in P2, and no edema   ABD:  soft and nontender with nl inspiratory excursion in the supine position. No bruits or organomegaly appreciated, bowel sounds nl  MS:  Nl gait/ ext warm without deformities, calf tenderness, cyanosis or clubbing No obvious joint restrictions   SKIN: warm and dry without lesions    NEURO:  alert, approp, nl sensorium with  no motor or cerebellar deficits apparent.     Lab Results  Component Value Date   HGB 8.4 (L) 06/13/2018   HGB 8.8 (L) 06/10/2018   HGB 7.7 (L) 05/31/2018     CXR PA  and Lateral:   06/18/2018 :    I personally reviewed images and agree with radiology impression as follows:   Near complete resolution of previously seen reticulonodular densities.      Assessment   DOE (dyspnea on exertion) Onset April 2019 with dx of HIV and pulmonary infiltrates c/w PCP  - 06/18/2018   Walked RA  2 laps @  approx 211ft each @ slow/mod pace  stopped due to  Light headed > sob, sats 100%   Serial chest x-rays were reviewed and are consistent with resolving opportunistic infection most likely PCP with a normal exam now and the expectation that she will gradually improve.  Note however she is still anemic and her main complaint is more related to that (fatigue, lightheaded with activity).  One  caveat is that she is just finished a round of prednisone which may make her look better than she really is so I would like to follow her up in 3 weeks to see if she sustains the same benefit off of  prednisone.   Total time devoted to counseling  > 50 % of initial 60 min office visit:  review extensive case hx/ inpt notes  with pt/  directly observed portions of ambulatory 02 saturation study/ discussion of options/alternatives/ personally creating written customized instructions  in presence of pt  then going over those specific  Instructions directly with the pt including how to use all of the meds but in particular covering each new medication in detail and the difference between the maintenance= "automatic" meds and the prns using an action plan format for the latter (If this problem/symptom => do that organization reading Left to right).  Please see AVS from this visit for a full list of these instructions which I personally wrote for this pt and  are unique to this visit.      Christinia Gully, MD 06/18/2018

## 2018-06-19 ENCOUNTER — Encounter: Payer: Self-pay | Admitting: Internal Medicine

## 2018-06-19 ENCOUNTER — Telehealth: Payer: Self-pay | Admitting: Family

## 2018-06-19 MED FILL — BIKTARVY 50-200-25 MG TABS: 50-200-25 | 30 days supply | Qty: 30 | Fill #0

## 2018-06-19 NOTE — Progress Notes (Signed)
Spoke with pt and notified of results per Dr. Wert. Pt verbalized understanding and denied any questions. 

## 2018-06-19 NOTE — Assessment & Plan Note (Signed)
Onset April 2019 with dx of HIV and pulmonary infiltrates c/w PCP  - 06/18/2018   Walked RA  2 laps @  approx 241ft each @ slow/mod pace  stopped due to  Light headed > sob, sats 100%   Serial chest x-rays were reviewed and are consistent with resolving opportunistic infection most likely PCP with a normal exam now and the expectation that she will gradually improve.  Note however she is still anemic and her main complaint is more related to that (fatigue, lightheaded with activity).  1 caveat is that she is just finished a round of prednisone which may make her look better than she really is so I would like to follow her up in 3 weeks to see if she sustains the same benefit off of prednisone.   Total time devoted to counseling  > 50 % of initial 60 min office visit:  review extensive case hx/ inpt notes  with pt/  directly observed portions of ambulatory 02 saturation study/ discussion of options/alternatives/ personally creating written customized instructions  in presence of pt  then going over those specific  Instructions directly with the pt including how to use all of the meds but in particular covering each new medication in detail and the difference between the maintenance= "automatic" meds and the prns using an action plan format for the latter (If this problem/symptom => do that organization reading Left to right).  Please see AVS from this visit for a full list of these instructions which I personally wrote for this pt and  are unique to this visit.

## 2018-06-19 NOTE — Telephone Encounter (Signed)
COVID-19 Pre-Screening Questions: ° °Do you currently have a fever (>100 °F), chills or unexplained body aches? No  ° °Are you currently experiencing new cough, shortness of breath, sore throat, runny nose? No  °•  °Have you recently travelled outside the state of [place] in the last 14 days? No  °•  °1. Have you been in contact with someone that is currently pending confirmation of Covid19 testing or has been confirmed to have the Covid19 virus?  No  ° °

## 2018-06-20 ENCOUNTER — Other Ambulatory Visit: Payer: Self-pay

## 2018-06-20 ENCOUNTER — Ambulatory Visit (INDEPENDENT_AMBULATORY_CARE_PROVIDER_SITE_OTHER): Payer: 59 | Admitting: Family

## 2018-06-20 ENCOUNTER — Encounter: Payer: Self-pay | Admitting: Family

## 2018-06-20 VITALS — BP 106/73 | HR 119 | Temp 98.0°F

## 2018-06-20 DIAGNOSIS — R87810 Cervical high risk human papillomavirus (HPV) DNA test positive: Secondary | ICD-10-CM | POA: Diagnosis not present

## 2018-06-20 DIAGNOSIS — B59 Pneumocystosis: Secondary | ICD-10-CM | POA: Diagnosis not present

## 2018-06-20 DIAGNOSIS — Z5181 Encounter for therapeutic drug level monitoring: Secondary | ICD-10-CM | POA: Diagnosis not present

## 2018-06-20 DIAGNOSIS — R87610 Atypical squamous cells of undetermined significance on cytologic smear of cervix (ASC-US): Secondary | ICD-10-CM

## 2018-06-20 DIAGNOSIS — B20 Human immunodeficiency virus [HIV] disease: Secondary | ICD-10-CM | POA: Diagnosis not present

## 2018-06-20 MED ORDER — BICTEGRAVIR-EMTRICITAB-TENOFOV 50-200-25 MG PO TABS: 1.0000 | ORAL_TABLET | Freq: Every day | ORAL | 3 refills | Status: DC

## 2018-06-20 MED ORDER — DAPSONE 100 MG PO TABS: 100.0000 mg | ORAL_TABLET | Freq: Every day | ORAL | 0 refills | Status: DC

## 2018-06-20 NOTE — Patient Instructions (Signed)
Nice to see you.  Please continue to take your Biktarvy and Dapsone.  We will check your blood work today.  Continue to follow up with Oncology and Pulmonology  Let us know if you need your FMLA papers completed.  Plan for follow up in 1 month or sooner if needed.

## 2018-06-20 NOTE — Progress Notes (Signed)
Subjective:    Patient ID: Krystal Short, female    DOB: Jun 07, 1985, 33 y.o.   MRN: [id number]  Chief Complaint  Patient presents with  . Follow-up    feeling better, still some achiness. ?s about bactrim     HPI:  Krystal Short is a 33 y.o. female with AIDS who was last seen in the office on 06/10/18 with continued treatment for presumed PCP pneumonia and concern for sarcoidosis. She has since seen pulmonology for potential sarcoidosis and oncology for anemia. No definitive diagnosis at present and has follow ups scheduled.   Krystal Short continues to take her Biktarvy and dapsone as prescribed. She completed her prednisone about 2 days ago. She has had no adverse side effects. She is feeling better today although continues to have some body aches and occasional dizziness. Her appetite is returning and she is eating and drinking more. No shortness of breath but does feel like she is not able to get complete breaths of air in at a time. She has not been back to work since the early April due to this ongoing condition. She is scheduled for a conization cervical biopsy with abnormal Pap Smear and colposcopy.    Allergies  Allergen Reactions  . Heparin       Outpatient Medications Prior to Visit  Medication Sig Dispense Refill  . acetaminophen (TYLENOL) 325 MG tablet Take 650 mg by mouth every 6 (six) hours as needed for mild pain or headache.    . bictegravir-emtricitabine-tenofovir AF (BIKTARVY) 50-200-25 MG TABS tablet Take 1 tablet by mouth daily. 30 tablet 3  . dapsone 100 MG tablet Take 1 tablet (100 mg total) by mouth daily. 30 tablet 0  . ondansetron (ZOFRAN) 4 MG tablet Take 1 tablet (4 mg total) by mouth every 6 (six) hours. (Patient not taking: Reported on 06/20/2018) 12 tablet 0   No facility-administered medications prior to visit.      Past Medical History:  Diagnosis Date  . Acute hyponatremia 06/03/2017  . Anemia   . CAP (community acquired pneumonia) 06/03/2017   . Community acquired pneumonia 06/05/2017  . Facial dermatitis 06/03/2017  . HIV (human immunodeficiency virus infection) ([place])   . Pleurisy 06/03/2017  . Pneumonia 06/06/2017  . 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 ([place])   . Thrush of mouth and esophagus ([place])   . UTI (urinary tract infection)      Past Surgical History:  Procedure Laterality Date  . NO PAST SURGERIES         Review of Systems  Constitutional: Negative for chills and fever.  Respiratory: Positive for chest tightness. Negative for shortness of breath and wheezing.   Cardiovascular: Negative for chest pain and palpitations.  Gastrointestinal: Negative for constipation, diarrhea, nausea and vomiting.      Objective:    BP 106/73   Pulse (!) 119   Temp 98 F (36.7 C)   LMP  (LMP Unknown) Comment: depo injection this week (06/20/18)  SpO2 99% Comment: room air Nursing note and vital signs reviewed.  Physical Exam Constitutional:      General: She is not in acute distress.    Appearance: She is well-developed.  Cardiovascular:     Rate and Rhythm: Regular rhythm. Tachycardia present.     Heart sounds: Normal heart sounds.  Pulmonary:     Effort: Pulmonary effort is normal.     Breath sounds: Normal breath sounds.  Skin:    General: Skin is warm and dry.  Neurological:     Mental Status: She is alert and oriented to person, place, and time.  Psychiatric:        Mood and Affect: Mood normal.        Assessment & Plan:   Problem List Items Addressed This Visit      Respiratory   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 ([place])    Completed treatment. Feeling better today. Continue to follow up with pulmonology as planned to rule out differential diagnoses. Remain on Dapsone for prevention.       Relevant Medications   bictegravir-emtricitabine-tenofovir AF (BIKTARVY) 50-200-25 MG TABS tablet   dapsone 100 MG tablet     Genitourinary   ASCUS with positive high risk  HPV cervical    Has cervical biopsy pending for July 2020.         Other   AIDS ([place]) - Primary    Krystal Short has adequately controlled AIDS with a viral load that has been suppressed however complicated with low CD4 count of 10. There is question if her most recent bout of pneumonia could be PCP despite the fact that she was well maintained on Bactrim. She has completed treatment and is feeling better today. Differentials for her symptoms remain plausable with a concern for malignancy given abnormal Pap. We will check her CD4 count and viral load to today. Plan to continue current dose of Biktarvy and Dapsone for now with goal of transitioning Dapsone back to Bactrim if able depending on kidney function.       Relevant Medications   bictegravir-emtricitabine-tenofovir AF (BIKTARVY) 50-200-25 MG TABS tablet   dapsone 100 MG tablet   Other Relevant Orders   HIV-1 RNA quant-no reflex-bld   T-helper cell (CD4)- ([facility] clinic only)   COMPLETE METABOLIC PANEL WITH GFR   CBC w/Diff   Medication monitoring encounter    Previous with hyperkalemia. Check CBC and CMET.           I am having Krystal Short maintain her ondansetron, acetaminophen, bictegravir-emtricitabine-tenofovir AF, and dapsone.   Meds ordered this encounter  Medications  . bictegravir-emtricitabine-tenofovir AF (BIKTARVY) 50-200-25 MG TABS tablet    Sig: Take 1 tablet by mouth daily.    Dispense:  30 tablet    Refill:  3    Order Specific Question:   Supervising Provider    Answer:   Carlyle Basques [[id number]]  . dapsone 100 MG tablet    Sig: Take 1 tablet (100 mg total) by mouth daily.    Dispense:  30 tablet    Refill:  0    Order Specific Question:   Supervising Provider    Answer:   Carlyle Basques [[id number]]     Follow-up: Return in about 1 month (around 07/21/2018), or if symptoms worsen or fail to improve.   Terri Piedra, MSN, FNP-C Nurse Practitioner [facility] [place] number: [phone]

## 2018-06-20 NOTE — Assessment & Plan Note (Signed)
Completed treatment. Feeling better today. Continue to follow up with pulmonology as planned to rule out differential diagnoses. Remain on Dapsone for prevention.

## 2018-06-20 NOTE — Assessment & Plan Note (Signed)
Has cervical biopsy pending for July 2020.

## 2018-06-20 NOTE — Assessment & Plan Note (Signed)
Krystal Short has adequately controlled AIDS with a viral load that has been suppressed however complicated with low CD4 count of 10. There is question if her most recent bout of pneumonia could be PCP despite the fact that she was well maintained on Bactrim. She has completed treatment and is feeling better today. Differentials for her symptoms remain plausable with a concern for malignancy given abnormal Pap. We will check her CD4 count and viral load to today. Plan to continue current dose of Biktarvy and Dapsone for now with goal of transitioning Dapsone back to Bactrim if able depending on kidney function.

## 2018-06-20 NOTE — Assessment & Plan Note (Signed)
Previous with hyperkalemia. Check CBC and CMET.

## 2018-06-21 LAB — T-HELPER CELL (CD4) - (RCID CLINIC ONLY)
CD4 % Helper T Cell: 3 % — ABNORMAL LOW (ref 33–65)
CD4 T Cell Abs: <35 /uL — ABNORMAL LOW (ref 400–1790)

## 2018-06-23 ENCOUNTER — Other Ambulatory Visit: Payer: Self-pay

## 2018-06-24 ENCOUNTER — Inpatient Hospital Stay: Payer: 59

## 2018-06-25 ENCOUNTER — Telehealth: Payer: Self-pay

## 2018-06-25 NOTE — Telephone Encounter (Signed)
Called patient to see if she had a complete copy of FMLA form. Our office was faxed incomplete form from [employer]. Patient states she did not receive a copy of form, but was able to give her case manager information. .  Attempted to call Case Manager to have complete form faxed to our office. Unable to reach them at this time,left voicemail requesting complete FMLA form. [employer]: Case Manager: Neysa Hotter  P: [phone] ext [place], [place]

## 2018-06-25 NOTE — Telephone Encounter (Signed)
Called Samantha with [employer] to have FMLA paperwork refaxed to our office. Krystal Short was able to take my call and states she will fax another copy of form to our office. FMLA form should arrive by 5/20 pm. Faxed claims can take up to 24 hours before they arrive.  Aundria Rud, [place]

## 2018-06-26 LAB — CBC WITH DIFFERENTIAL/PLATELET
Absolute Monocytes: 602 cells/uL (ref 200–950)
Basophils Absolute: 10 cells/uL (ref 0–200)
Basophils Relative: 0.2 %
Eosinophils Absolute: 10 cells/uL — ABNORMAL LOW (ref 15–500)
Eosinophils Relative: 0.2 %
HCT: 23.1 % — ABNORMAL LOW (ref 35.0–45.0)
Hemoglobin: 8 g/dL — ABNORMAL LOW (ref 11.7–15.5)
Lymphs Abs: 714 cells/uL — ABNORMAL LOW (ref 850–3900)
MCH: 37.7 pg — ABNORMAL HIGH (ref 27.0–33.0)
MCHC: 34.6 g/dL (ref 32.0–36.0)
MCV: 109 fL — ABNORMAL HIGH (ref 80.0–100.0)
MPV: 10.3 fL (ref 7.5–12.5)
Monocytes Relative: 11.8 %
Neutro Abs: 3764 cells/uL (ref 1500–7800)
Neutrophils Relative %: 73.8 %
Platelets: 190 10*3/uL (ref 140–400)
RBC: 2.12 10*6/uL — ABNORMAL LOW (ref 3.80–5.10)
Total Lymphocyte: 14 %
WBC: 5.1 10*3/uL (ref 3.8–10.8)

## 2018-06-26 LAB — COMPLETE METABOLIC PANEL WITH GFR
AG Ratio: 0.9 (calc) — ABNORMAL LOW (ref 1.0–2.5)
ALT: 14 U/L (ref 6–29)
AST: 34 U/L — ABNORMAL HIGH (ref 10–30)
Albumin: 3.4 g/dL — ABNORMAL LOW (ref 3.6–5.1)
Alkaline phosphatase (APISO): 88 U/L (ref 31–125)
BUN: 9 mg/dL (ref 7–25)
CO2: 24 mmol/L (ref 20–32)
Calcium: 8.6 mg/dL (ref 8.6–10.2)
Chloride: 102 mmol/L (ref 98–110)
Creat: 0.75 mg/dL (ref 0.50–1.10)
GFR, Est African American: 123 mL/min/{1.73_m2} (ref >=60)
GFR, Est Non African American: 106 mL/min/{1.73_m2} (ref >=60)
Globulin: 3.8 g/dL (calc) — ABNORMAL HIGH (ref 1.9–3.7)
Glucose, Bld: 75 mg/dL (ref 65–99)
Potassium: 4.6 mmol/L (ref 3.5–5.3)
Sodium: 135 mmol/L (ref 135–146)
Total Bilirubin: 1.1 mg/dL (ref 0.2–1.2)
Total Protein: 7.2 g/dL (ref 6.1–8.1)

## 2018-06-26 LAB — HIV-1 RNA QUANT-NO REFLEX-BLD
HIV 1 RNA Quant: <20 copies/mL — AB
HIV-1 RNA Quant, Log: <1.3 Log copies/mL — AB

## 2018-06-27 ENCOUNTER — Ambulatory Visit: Payer: 59 | Admitting: Oncology

## 2018-06-28 ENCOUNTER — Telehealth: Payer: Self-pay | Admitting: *Deleted

## 2018-06-28 NOTE — Telephone Encounter (Signed)
I am concerned about the continuous symptoms that she is having especially when her steroid levels come down. Would recommend follow up with pulmonology while off steroids as this could be related to sarcoidosis. The only other option we would have at this time would be going to Urgent Care for evaluation. I do not think she needs to go to the ED at the present time but if shortness of breath worsens it would be recommended.

## 2018-06-28 NOTE — Telephone Encounter (Signed)
Patient called to give a check up on her symptoms. She states she still feels bad fever 06/27/18 102.3, cough, chills, sweats, slight headache, body aches, no appetite, and when she blows her nose it bleeds. She is staying hydrated but not much change. She does state that she only gets short of breath when moving around but denies any pain in her chest. Advised her will let Calone know and give her a call back once he responds.   Reminded her we have not gotten her paperwork.

## 2018-07-02 ENCOUNTER — Other Ambulatory Visit: Payer: Self-pay

## 2018-07-02 ENCOUNTER — Inpatient Hospital Stay: Payer: 59

## 2018-07-02 DIAGNOSIS — D649 Anemia, unspecified: Secondary | ICD-10-CM | POA: Diagnosis not present

## 2018-07-02 DIAGNOSIS — D72829 Elevated white blood cell count, unspecified: Secondary | ICD-10-CM | POA: Diagnosis not present

## 2018-07-02 DIAGNOSIS — Z79899 Other long term (current) drug therapy: Secondary | ICD-10-CM | POA: Diagnosis not present

## 2018-07-02 DIAGNOSIS — B20 Human immunodeficiency virus [HIV] disease: Secondary | ICD-10-CM | POA: Diagnosis not present

## 2018-07-02 DIAGNOSIS — D696 Thrombocytopenia, unspecified: Secondary | ICD-10-CM | POA: Diagnosis not present

## 2018-07-02 DIAGNOSIS — Z888 Allergy status to other drugs, medicaments and biological substances status: Secondary | ICD-10-CM | POA: Diagnosis not present

## 2018-07-02 DIAGNOSIS — Z87891 Personal history of nicotine dependence: Secondary | ICD-10-CM | POA: Diagnosis not present

## 2018-07-02 LAB — RETICULOCYTES
Immature Retic Fract: 10.4 % (ref 2.3–15.9)
RBC.: 3.79 MIL/uL — ABNORMAL LOW (ref 3.87–5.11)
Retic Count, Absolute: 77.3 10*3/uL (ref 19.0–186.0)
Retic Ct Pct: 2 % (ref 0.4–3.1)

## 2018-07-02 NOTE — Telephone Encounter (Signed)
Noted. Thanks.

## 2018-07-02 NOTE — Telephone Encounter (Signed)
FMLA paper work faxed to our office. Paperwork placed in Terri Piedra, FNP box. [place]

## 2018-07-03 NOTE — Telephone Encounter (Signed)
Patient called today stating symptoms have gotten worse. No fever today, temp was 98.6. Advised for patient to contact PCP or go to urgent care for evaluation. [place]

## 2018-07-04 ENCOUNTER — Emergency Department (HOSPITAL_COMMUNITY): Payer: 59

## 2018-07-04 ENCOUNTER — Inpatient Hospital Stay (HOSPITAL_COMMUNITY): Payer: 59

## 2018-07-04 ENCOUNTER — Other Ambulatory Visit: Payer: Self-pay

## 2018-07-04 ENCOUNTER — Encounter (HOSPITAL_COMMUNITY): Payer: Self-pay | Admitting: Emergency Medicine

## 2018-07-04 ENCOUNTER — Other Ambulatory Visit (HOSPITAL_COMMUNITY): Payer: Self-pay

## 2018-07-04 ENCOUNTER — Inpatient Hospital Stay (HOSPITAL_COMMUNITY)
Admission: EM | Admit: 2018-07-04 | Discharge: 2018-07-16 | DRG: 974 | Disposition: A | Discharge status: Home / Self Care | Payer: 59 | Attending: Family Medicine | Admitting: Family Medicine

## 2018-07-04 DIAGNOSIS — E861 Hypovolemia: Secondary | ICD-10-CM | POA: Diagnosis present

## 2018-07-04 DIAGNOSIS — J984 Other disorders of lung: Secondary | ICD-10-CM | POA: Diagnosis not present

## 2018-07-04 DIAGNOSIS — J189 Pneumonia, unspecified organism: Secondary | ICD-10-CM | POA: Diagnosis not present

## 2018-07-04 DIAGNOSIS — B20 Human immunodeficiency virus [HIV] disease: Secondary | ICD-10-CM | POA: Diagnosis not present

## 2018-07-04 DIAGNOSIS — R87810 Cervical high risk human papillomavirus (HPV) DNA test positive: Secondary | ICD-10-CM | POA: Diagnosis present

## 2018-07-04 DIAGNOSIS — D696 Thrombocytopenia, unspecified: Secondary | ICD-10-CM | POA: Diagnosis present

## 2018-07-04 DIAGNOSIS — A419 Sepsis, unspecified organism: Principal | ICD-10-CM | POA: Diagnosis present

## 2018-07-04 DIAGNOSIS — J939 Pneumothorax, unspecified: Secondary | ICD-10-CM

## 2018-07-04 DIAGNOSIS — R000 Tachycardia, unspecified: Secondary | ICD-10-CM

## 2018-07-04 DIAGNOSIS — R634 Abnormal weight loss: Secondary | ICD-10-CM | POA: Diagnosis not present

## 2018-07-04 DIAGNOSIS — R002 Palpitations: Secondary | ICD-10-CM | POA: Diagnosis not present

## 2018-07-04 DIAGNOSIS — R918 Other nonspecific abnormal finding of lung field: Secondary | ICD-10-CM | POA: Diagnosis present

## 2018-07-04 DIAGNOSIS — E871 Hypo-osmolality and hyponatremia: Secondary | ICD-10-CM | POA: Diagnosis present

## 2018-07-04 DIAGNOSIS — Z825 Family history of asthma and other chronic lower respiratory diseases: Secondary | ICD-10-CM

## 2018-07-04 DIAGNOSIS — J9601 Acute respiratory failure with hypoxia: Secondary | ICD-10-CM | POA: Diagnosis not present

## 2018-07-04 DIAGNOSIS — Z87891 Personal history of nicotine dependence: Secondary | ICD-10-CM

## 2018-07-04 DIAGNOSIS — D761 Hemophagocytic lymphohistiocytosis: Secondary | ICD-10-CM | POA: Diagnosis not present

## 2018-07-04 DIAGNOSIS — D539 Nutritional anemia, unspecified: Secondary | ICD-10-CM | POA: Diagnosis present

## 2018-07-04 DIAGNOSIS — D638 Anemia in other chronic diseases classified elsewhere: Secondary | ICD-10-CM | POA: Diagnosis present

## 2018-07-04 DIAGNOSIS — R162 Hepatomegaly with splenomegaly, not elsewhere classified: Secondary | ICD-10-CM | POA: Diagnosis not present

## 2018-07-04 DIAGNOSIS — N926 Irregular menstruation, unspecified: Secondary | ICD-10-CM | POA: Diagnosis present

## 2018-07-04 DIAGNOSIS — D649 Anemia, unspecified: Secondary | ICD-10-CM

## 2018-07-04 DIAGNOSIS — D7589 Other specified diseases of blood and blood-forming organs: Secondary | ICD-10-CM | POA: Diagnosis present

## 2018-07-04 DIAGNOSIS — R87613 High grade squamous intraepithelial lesion on cytologic smear of cervix (HGSIL): Secondary | ICD-10-CM | POA: Diagnosis present

## 2018-07-04 DIAGNOSIS — Z8701 Personal history of pneumonia (recurrent): Secondary | ICD-10-CM

## 2018-07-04 DIAGNOSIS — R05 Cough: Secondary | ICD-10-CM | POA: Diagnosis not present

## 2018-07-04 DIAGNOSIS — N390 Urinary tract infection, site not specified: Secondary | ICD-10-CM | POA: Diagnosis present

## 2018-07-04 DIAGNOSIS — B3781 Candidal esophagitis: Secondary | ICD-10-CM | POA: Diagnosis present

## 2018-07-04 DIAGNOSIS — D75A Glucose-6-phosphate dehydrogenase (G6PD) deficiency without anemia: Secondary | ICD-10-CM | POA: Diagnosis present

## 2018-07-04 DIAGNOSIS — D61818 Other pancytopenia: Secondary | ICD-10-CM

## 2018-07-04 DIAGNOSIS — Z888 Allergy status to other drugs, medicaments and biological substances status: Secondary | ICD-10-CM | POA: Diagnosis not present

## 2018-07-04 DIAGNOSIS — R5383 Other fatigue: Secondary | ICD-10-CM | POA: Diagnosis not present

## 2018-07-04 DIAGNOSIS — R590 Localized enlarged lymph nodes: Secondary | ICD-10-CM | POA: Diagnosis not present

## 2018-07-04 DIAGNOSIS — N179 Acute kidney failure, unspecified: Secondary | ICD-10-CM | POA: Diagnosis present

## 2018-07-04 DIAGNOSIS — B370 Candidal stomatitis: Secondary | ICD-10-CM | POA: Diagnosis present

## 2018-07-04 DIAGNOSIS — R5381 Other malaise: Secondary | ICD-10-CM | POA: Diagnosis not present

## 2018-07-04 DIAGNOSIS — Z79899 Other long term (current) drug therapy: Secondary | ICD-10-CM

## 2018-07-04 DIAGNOSIS — R591 Generalized enlarged lymph nodes: Secondary | ICD-10-CM | POA: Diagnosis present

## 2018-07-04 DIAGNOSIS — E875 Hyperkalemia: Secondary | ICD-10-CM | POA: Diagnosis not present

## 2018-07-04 DIAGNOSIS — Z9889 Other specified postprocedural states: Secondary | ICD-10-CM | POA: Diagnosis not present

## 2018-07-04 DIAGNOSIS — D759 Disease of blood and blood-forming organs, unspecified: Secondary | ICD-10-CM | POA: Diagnosis not present

## 2018-07-04 DIAGNOSIS — Z20828 Contact with and (suspected) exposure to other viral communicable diseases: Secondary | ICD-10-CM | POA: Diagnosis present

## 2018-07-04 DIAGNOSIS — R87610 Atypical squamous cells of undetermined significance on cytologic smear of cervix (ASC-US): Secondary | ICD-10-CM | POA: Diagnosis present

## 2018-07-04 DIAGNOSIS — E860 Dehydration: Secondary | ICD-10-CM | POA: Diagnosis present

## 2018-07-04 DIAGNOSIS — Z8619 Personal history of other infectious and parasitic diseases: Secondary | ICD-10-CM | POA: Diagnosis not present

## 2018-07-04 DIAGNOSIS — J8410 Pulmonary fibrosis, unspecified: Secondary | ICD-10-CM | POA: Diagnosis not present

## 2018-07-04 DIAGNOSIS — B59 Pneumocystosis: Secondary | ICD-10-CM | POA: Diagnosis not present

## 2018-07-04 DIAGNOSIS — R0609 Other forms of dyspnea: Secondary | ICD-10-CM | POA: Diagnosis not present

## 2018-07-04 DIAGNOSIS — R846 Abnormal cytological findings in specimens from respiratory organs and thorax: Secondary | ICD-10-CM | POA: Diagnosis not present

## 2018-07-04 DIAGNOSIS — D509 Iron deficiency anemia, unspecified: Secondary | ICD-10-CM | POA: Diagnosis present

## 2018-07-04 DIAGNOSIS — R0602 Shortness of breath: Secondary | ICD-10-CM | POA: Diagnosis not present

## 2018-07-04 DIAGNOSIS — Z21 Asymptomatic human immunodeficiency virus [HIV] infection status: Secondary | ICD-10-CM | POA: Diagnosis not present

## 2018-07-04 LAB — VITAMIN B12: Vitamin B-12: 725 pg/mL (ref 180–914)

## 2018-07-04 LAB — DIRECT ANTIGLOBULIN TEST (NOT AT ARMC)
DAT, IgG: NEGATIVE
DAT, complement: NEGATIVE

## 2018-07-04 LAB — COMPREHENSIVE METABOLIC PANEL
ALT: 12 U/L (ref 0–44)
AST: 39 U/L (ref 15–41)
Albumin: 2.4 g/dL — ABNORMAL LOW (ref 3.5–5.0)
Alkaline Phosphatase: 153 U/L — ABNORMAL HIGH (ref 38–126)
Anion gap: 11 (ref 5–15)
BUN: 26 mg/dL — ABNORMAL HIGH (ref 6–20)
CO2: 23 mmol/L (ref 22–32)
Calcium: 7.4 mg/dL — ABNORMAL LOW (ref 8.9–10.3)
Chloride: 94 mmol/L — ABNORMAL LOW (ref 98–111)
Creatinine, Ser: 1.13 mg/dL — ABNORMAL HIGH (ref 0.44–1.00)
GFR calc Af Amer: >60 mL/min (ref >60)
GFR calc non Af Amer: >60 mL/min (ref >60)
Glucose, Bld: 116 mg/dL — ABNORMAL HIGH (ref 70–99)
Potassium: 4.1 mmol/L (ref 3.5–5.1)
Sodium: 128 mmol/L — ABNORMAL LOW (ref 135–145)
Total Bilirubin: 1.4 mg/dL — ABNORMAL HIGH (ref 0.3–1.2)
Total Protein: 7.3 g/dL (ref 6.5–8.1)

## 2018-07-04 LAB — LACTIC ACID, PLASMA
Lactic Acid, Venous: 2.6 mmol/L (ref 0.5–1.9)
Lactic Acid, Venous: 2.2 mmol/L (ref 0.5–1.9)

## 2018-07-04 LAB — D-DIMER, QUANTITATIVE: D-Dimer, Quant: 0.47 ug/mL-FEU (ref 0.00–0.50)

## 2018-07-04 LAB — URINALYSIS, ROUTINE W REFLEX MICROSCOPIC
Bilirubin Urine: NEGATIVE
Glucose, UA: NEGATIVE mg/dL
Hgb urine dipstick: NEGATIVE
Ketones, ur: NEGATIVE mg/dL
Leukocytes,Ua: NEGATIVE
Nitrite: NEGATIVE
Protein, ur: NEGATIVE mg/dL
Specific Gravity, Urine: 1.009 (ref 1.005–1.030)
pH: 6 (ref 5.0–8.0)

## 2018-07-04 LAB — RETICULOCYTES
Immature Retic Fract: 26.4 % — ABNORMAL HIGH (ref 2.3–15.9)
RBC.: 1.54 MIL/uL — ABNORMAL LOW (ref 3.87–5.11)
Retic Count, Absolute: 30.2 10*3/uL (ref 19.0–186.0)
Retic Ct Pct: 2 % (ref 0.4–3.1)

## 2018-07-04 LAB — IRON AND TIBC
Iron: 58 ug/dL (ref 28–170)
Saturation Ratios: 18 % (ref 10.4–31.8)
TIBC: 324 ug/dL (ref 250–450)
UIBC: 266 ug/dL

## 2018-07-04 LAB — SARS CORONAVIRUS 2 BY RT PCR (HOSPITAL ORDER, PERFORMED IN ~~LOC~~ HOSPITAL LAB): SARS Coronavirus 2: NEGATIVE

## 2018-07-04 LAB — I-STAT BETA HCG BLOOD, ED (MC, WL, AP ONLY): I-stat hCG, quantitative: <5 m[IU]/mL (ref <5)

## 2018-07-04 LAB — PREPARE RBC (CROSSMATCH)

## 2018-07-04 LAB — C-REACTIVE PROTEIN: CRP: 32.7 mg/dL — ABNORMAL HIGH (ref <1.0)

## 2018-07-04 LAB — MRSA PCR SCREENING: MRSA by PCR: NEGATIVE

## 2018-07-04 LAB — FOLATE: Folate: 13.7 ng/mL (ref >5.9)

## 2018-07-04 LAB — SAVE SMEAR(SSMR), FOR PROVIDER SLIDE REVIEW

## 2018-07-04 LAB — FERRITIN: Ferritin: 1106 ng/mL — ABNORMAL HIGH (ref 11–307)

## 2018-07-04 LAB — LACTATE DEHYDROGENASE: LDH: 595 U/L — ABNORMAL HIGH (ref 98–192)

## 2018-07-04 MED ORDER — FOLIC ACID 1 MG PO TABS
2.0000 mg | ORAL_TABLET | Freq: Every day | ORAL | Status: DC
  Administered 2018-07-05 – 2018-07-16 (×13): 2 mg via ORAL
  Filled 2018-07-04 (×14): qty 2

## 2018-07-04 MED ORDER — ONDANSETRON HCL 4 MG PO TABS: 4.0000 mg | ORAL_TABLET | Freq: Four times a day (QID) | ORAL | Status: DC | PRN

## 2018-07-04 MED ORDER — HYDROCODONE-HOMATROPINE 5-1.5 MG/5ML PO SYRP
5.0000 mL | ORAL_SOLUTION | Freq: Four times a day (QID) | ORAL | Status: DC | PRN
  Administered 2018-07-05 – 2018-07-08 (×4): 5 mL via ORAL
  Filled 2018-07-04 (×5): qty 5

## 2018-07-04 MED ORDER — ORAL CARE MOUTH RINSE
15.0000 mL | Freq: Two times a day (BID) | OROMUCOSAL | Status: DC
  Administered 2018-07-04 – 2018-07-16 (×15): 15 mL via OROMUCOSAL

## 2018-07-04 MED ORDER — SODIUM CHLORIDE 0.9% IV SOLUTION: Freq: Once | INTRAVENOUS | Status: DC

## 2018-07-04 MED ORDER — SODIUM CHLORIDE 0.9 % IV SOLN
INTRAVENOUS | Status: DC
  Administered 2018-07-05 (×2): via INTRAVENOUS

## 2018-07-04 MED ORDER — SODIUM CHLORIDE 0.9 % IV SOLN
500.0000 mg | Freq: Once | INTRAVENOUS | Status: AC
  Administered 2018-07-04: 500 mg via INTRAVENOUS
  Filled 2018-07-04: qty 500

## 2018-07-04 MED ORDER — PREDNISONE 20 MG PO TABS
40.0000 mg | ORAL_TABLET | Freq: Every day | ORAL | Status: DC
  Administered 2018-07-04 – 2018-07-16 (×13): 40 mg via ORAL
  Filled 2018-07-04 (×13): qty 2

## 2018-07-04 MED ORDER — LACTATED RINGERS IV BOLUS (SEPSIS)
500.0000 mL | Freq: Once | INTRAVENOUS | Status: AC
  Administered 2018-07-04: 14:00:00 500 mL via INTRAVENOUS

## 2018-07-04 MED ORDER — SODIUM CHLORIDE (PF) 0.9 % IJ SOLN
INTRAMUSCULAR | Status: AC
  Filled 2018-07-04: qty 50

## 2018-07-04 MED ORDER — B COMPLEX-C PO TABS
1.0000 | ORAL_TABLET | Freq: Every day | ORAL | Status: DC
  Administered 2018-07-05 – 2018-07-16 (×13): 1 via ORAL
  Filled 2018-07-04 (×13): qty 1

## 2018-07-04 MED ORDER — SODIUM CHLORIDE 0.9 % IV SOLN
2.0000 g | Freq: Once | INTRAVENOUS | Status: AC
  Administered 2018-07-04: 2 g via INTRAVENOUS
  Filled 2018-07-04: qty 2

## 2018-07-04 MED ORDER — BICTEGRAVIR-EMTRICITAB-TENOFOV 50-200-25 MG PO TABS
1.0000 | ORAL_TABLET | Freq: Every day | ORAL | Status: DC
  Administered 2018-07-05 – 2018-07-16 (×12): 1 via ORAL
  Filled 2018-07-04 (×12): qty 1

## 2018-07-04 MED ORDER — VANCOMYCIN HCL IN DEXTROSE 1-5 GM/200ML-% IV SOLN
1000.0000 mg | Freq: Once | INTRAVENOUS | Status: AC
  Administered 2018-07-04: 13:00:00 1000 mg via INTRAVENOUS
  Filled 2018-07-04: qty 200

## 2018-07-04 MED ORDER — ONDANSETRON HCL 4 MG/2ML IJ SOLN: 4.0000 mg | Freq: Four times a day (QID) | INTRAMUSCULAR | Status: DC | PRN

## 2018-07-04 MED ORDER — SODIUM CHLORIDE 0.9% FLUSH
3.0000 mL | Freq: Once | INTRAVENOUS | Status: AC
  Administered 2018-07-04: 13:00:00 3 mL via INTRAVENOUS

## 2018-07-04 MED ORDER — LACTATED RINGERS IV BOLUS (SEPSIS)
250.0000 mL | Freq: Once | INTRAVENOUS | Status: AC
  Administered 2018-07-04: 250 mL via INTRAVENOUS

## 2018-07-04 MED ORDER — IOHEXOL 300 MG/ML  SOLN
75.0000 mL | Freq: Once | INTRAMUSCULAR | Status: AC | PRN
  Administered 2018-07-04: 75 mL via INTRAVENOUS

## 2018-07-04 MED ORDER — LACTATED RINGERS IV BOLUS (SEPSIS)
1000.0000 mL | Freq: Once | INTRAVENOUS | Status: AC
  Administered 2018-07-04: 14:00:00 1000 mL via INTRAVENOUS

## 2018-07-04 MED ORDER — SULFAMETHOXAZOLE-TRIMETHOPRIM 800-160 MG PO TABS
2.0000 | ORAL_TABLET | Freq: Three times a day (TID) | ORAL | Status: DC
  Administered 2018-07-04 – 2018-07-10 (×17): 2 via ORAL
  Filled 2018-07-04 (×18): qty 2

## 2018-07-04 MED ORDER — CHLORHEXIDINE GLUCONATE CLOTH 2 % EX PADS
6.0000 | MEDICATED_PAD | Freq: Every day | CUTANEOUS | Status: DC
  Administered 2018-07-04 – 2018-07-12 (×5): 6 via TOPICAL

## 2018-07-04 MED ORDER — HYDROCODONE-ACETAMINOPHEN 7.5-325 MG PO TABS
1.0000 | ORAL_TABLET | Freq: Four times a day (QID) | ORAL | Status: DC | PRN
  Administered 2018-07-06 – 2018-07-08 (×4): 1 via ORAL
  Filled 2018-07-04 (×4): qty 1

## 2018-07-04 NOTE — ED Notes (Signed)
Lab called, lactic acid is 2.2, pt's nurse notified

## 2018-07-04 NOTE — ED Triage Notes (Signed)
Per pt, was recently discharged from hospital-started feeling bad again of the 16 th-weak, fever free since last Friday-SOB at rest and upon exertion-covid neg-nonproductive cough since last friday

## 2018-07-04 NOTE — Progress Notes (Signed)
A consult was received from an ED physician for vancomycin and cefepime per pharmacy dosing.  The patient's profile has been reviewed for ht/wt/allergies/indication/available labs.    A one time order has been placed for vancomycin 1000 mg IV and cefepime 2gm IV.  Further antibiotics/pharmacy consults should be ordered by admitting physician if indicated.                       Thank you, Lynelle Doctor 07/04/2018  1:08 PM

## 2018-07-04 NOTE — ED Notes (Signed)
ED TO INPATIENT HANDOFF REPORT  ED Nurse Name and Phone #: Dola Argyle [phone]  S Name/Age/Gender Krystal Short 33 y.o. female Room/Bed: WA07/WA07  Code Status   Code Status: Prior  Home/SNF/Other Home Patient oriented to: self, place, time and situation Is this baseline? Yes   Triage Complete: Triage complete  Chief Complaint fever.sob  Triage Note Per pt, was recently discharged from hospital-started feeling bad again of the 16 th-weak, fever free since last Friday-SOB at rest and upon exertion-covid neg-nonproductive cough since last friday   Allergies Allergies  Allergen Reactions  . Heparin     Level of Care/Admitting Diagnosis ED Disposition    ED Disposition Condition Comment   Admit  Hospital Area: [place] [facility] [[id number]]  Level of Care: Stepdown [14]  Admit to SDU based on following criteria: Respiratory Distress:  Frequent assessment and/or intervention to maintain adequate ventilation/respiration, pulmonary toilet, and respiratory treatment.  Admit to SDU based on following criteria: Hemodynamic compromise or significant risk of instability:  Patient requiring short term acute titration and management of vasoactive drips, and invasive monitoring (i.e., CVP and Arterial line).  Covid Evaluation: Confirmed COVID Negative  Diagnosis: Acute respiratory failure with hypoxia [facility] [[id number]]  Admitting Physician: [name] [phone]  Attending Physician: [name] [phone]  Estimated length of stay: past midnight tomorrow  Certification:: I certify this patient will need inpatient services for at least 2 midnights  PT Class (Do Not Modify): Inpatient [101]  PT Acc Code (Do Not Modify): Private [1]       B Medical/Surgery History Past Medical History:  Diagnosis Date  . Acute hyponatremia 06/03/2017  . Anemia   . CAP (community acquired pneumonia) 06/03/2017  . Community acquired pneumonia 06/05/2017  . Facial dermatitis 06/03/2017   . HIV (human immunodeficiency virus infection) ([place])   . Pleurisy 06/03/2017  . Pneumonia 06/06/2017  . 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 Thrush of mouth and esophagus ([place])   . UTI (urinary tract infection)    Past Surgical History:  Procedure Laterality Date  . NO PAST SURGERIES       A IV Location/Drains/Wounds Patient Lines/Drains/Airways Status   Active Line/Drains/Airways    Name:   Placement date:   Placement time:   Site:   Days:   Peripheral IV 07/04/18 Right Antecubital   07/04/18    1235    Antecubital   less than 1   Peripheral IV 07/04/18 Left Antecubital   07/04/18    1240    Antecubital   less than 1          Intake/Output Last 24 hours  Intake/Output Summary (Last 24 hours) at 07/04/2018 1555 Last data filed at 07/04/2018 1435 Gross per 24 hour  Intake 550 ml  Output -  Net 550 ml    Labs/Imaging Results for orders placed or performed during the hospital encounter of 07/04/18 (from the past 48 hour(s))  I-Stat beta hCG blood, ED     Status: None   Collection Time: 07/04/18 12:50 PM  Result Value Ref Range   I-stat hCG, quantitative <5.0 <5 mIU/mL   Comment 3            Comment:   GEST. AGE      CONC.  (mIU/mL)   <=1 WEEK        5 - 50     2 WEEKS       50 - 500  3 WEEKS       100 - 10,000     4 WEEKS     1,000 - 30,000        FEMALE AND NON-PREGNANT FEMALE:     LESS THAN 5 mIU/mL   Lactic acid, plasma     Status: Abnormal   Collection Time: 07/04/18 12:52 PM  Result Value Ref Range   Lactic Acid, Venous 2.6 (HH) 0.5 - 1.9 mmol/L    Comment: CRITICAL RESULT CALLED TO, READ BACK BY AND VERIFIED WITH: KHALID,A. RN AT 1320 07/04/18 MULLINS,T Performed at [facility], [place] [address], [place], [place] [address]   Comprehensive metabolic panel     Status: Abnormal   Collection Time: 07/04/18 12:54 PM  Result Value Ref Range   Sodium 128 (L) 135 - 145 mmol/L   Potassium 4.1 3.5 - 5.1 mmol/L    Chloride 94 (L) 98 - 111 mmol/L   CO2 23 22 - 32 mmol/L   Glucose, Bld 116 (H) 70 - 99 mg/dL   BUN 26 (H) 6 - 20 mg/dL   Creatinine, Ser 1.13 (H) 0.44 - 1.00 mg/dL   Calcium 7.4 (L) 8.9 - 10.3 mg/dL   Total Protein 7.3 6.5 - 8.1 g/dL   Albumin 2.4 (L) 3.5 - 5.0 g/dL   AST 39 15 - 41 U/L   ALT 12 0 - 44 U/L   Alkaline Phosphatase 153 (H) 38 - 126 U/L   Total Bilirubin 1.4 (H) 0.3 - 1.2 mg/dL   GFR calc non Af Amer >60 >60 mL/min   GFR calc Af Amer >60 >60 mL/min   Anion gap 11 5 - 15    Comment: Performed at [facility], [place] [address], [place], [place] [address]  CBC with Differential     Status: Abnormal   Collection Time: 07/04/18 12:54 PM  Result Value Ref Range   WBC 6.6 4.0 - 10.5 K/uL   RBC 1.57 (L) 3.87 - 5.11 MIL/uL   Hemoglobin 5.1 (LL) 12.0 - 15.0 g/dL    Comment: REPEATED TO VERIFY THIS CRITICAL RESULT HAS VERIFIED AND BEEN CALLED TO WEST,S. RN BY KATHLEEN COHEN ON 05 28 2020 AT 4315, AND HAS BEEN READ BACK. CRITICAL RESULT VERIFIED    HCT 16.8 (L) 36.0 - 46.0 %   MCV 107.0 (H) 80.0 - 100.0 fL   MCH 32.5 26.0 - 34.0 pg   MCHC 30.4 30.0 - 36.0 g/dL   RDW 22.1 (H) 11.5 - 15.5 %   Platelets 14 (LL) 150 - 400 K/uL    Comment: REPEATED TO VERIFY PLATELET COUNT CONFIRMED BY SMEAR SPECIMEN CHECKED FOR CLOTS Immature Platelet Fraction may be clinically indicated, consider ordering this additional test [id number]    nRBC 1.4 (H) 0.0 - 0.2 %   Neutrophils Relative % 62 %   Lymphocytes Relative 21 %   Monocytes Relative 14 %   Eosinophils Relative 0 %   Basophils Relative 3 %   Band Neutrophils 0 %   Metamyelocytes Relative 0 %   Myelocytes 0 %   Promyelocytes Relative 0 %   Blasts 0 %   nRBC 0 0 /100 WBC   Other 0 %   Neutro Abs 4.1 1.7 - 7.7 K/uL   Lymphs Abs 1.4 0.7 - 4.0 K/uL   Monocytes Absolute 0.9 0.1 - 1.0 K/uL   Eosinophils Absolute 0.0 0.0 - 0.5 K/uL   Basophils Absolute 0.2 (H) 0.0 - 0.1 K/uL   Smear Review  MORPHOLOGY UNREMARKABLE      Comment: Performed at [place] [address], [place], [place] [address]  SARS Coronavirus 2 (CEPHEID- Performed in [facility] hospital lab), Hosp Order     Status: None   Collection Time: 07/04/18  1:50 PM  Result Value Ref Range   SARS Coronavirus 2 NEGATIVE NEGATIVE    Comment: (NOTE) If result is NEGATIVE SARS-CoV-2 target nucleic acids are NOT DETECTED. The SARS-CoV-2 RNA is generally detectable in upper and lower  respiratory specimens during the acute phase of infection. The lowest  concentration of SARS-CoV-2 viral copies this assay can detect is 250  copies / mL. A negative result does not preclude SARS-CoV-2 infection  and should not be used as the sole basis for treatment or other  patient management decisions.  A negative result may occur with  improper specimen collection / handling, submission of specimen other  than nasopharyngeal swab, presence of viral mutation(s) within the  areas targeted by this assay, and inadequate number of viral copies  (<250 copies / mL). A negative result must be combined with clinical  observations, patient history, and epidemiological information. If result is POSITIVE SARS-CoV-2 target nucleic acids are DETECTED. The SARS-CoV-2 RNA is generally detectable in upper and lower  respiratory specimens dur ing the acute phase of infection.  Positive  results are indicative of active infection with SARS-CoV-2.  Clinical  correlation with patient history and other diagnostic information is  necessary to determine patient infection status.  Positive results do  not rule out bacterial infection or co-infection with other viruses. If result is PRESUMPTIVE POSTIVE SARS-CoV-2 nucleic acids MAY BE PRESENT.   A presumptive positive result was obtained on the submitted specimen  and confirmed on repeat testing.  While 2019 novel coronavirus  (SARS-CoV-2) nucleic acids may be present in the submitted sample  additional  confirmatory testing may be necessary for epidemiological  and / or clinical management purposes  to differentiate between  SARS-CoV-2 and other Sarbecovirus currently known to infect humans.  If clinically indicated additional testing with an alternate test  methodology [phone]) is advised. The SARS-CoV-2 RNA is generally  detectable in upper and lower respiratory sp ecimens during the acute  phase of infection. The expected result is Negative. Fact Sheet for Patients:  StrictlyIdeas.no Fact Sheet for Healthcare Providers: BankingDealers.co.za This test is not yet approved or cleared by the [place] FDA and has been authorized for detection and/or diagnosis of SARS-CoV-2 by FDA under an Emergency Use Authorization (EUA).  This EUA will remain in effect (meaning this test can be used) for the duration of the COVID-19 declaration under Section 564(b)(1) of the Act, 21 U.S.C. section 360bbb-3(b)(1), unless the authorization is terminated or revoked sooner. Performed at [facility], [place] [address] [place], [place] [address]   C-reactive protein     Status: Abnormal   Collection Time: 07/04/18  2:15 PM  Result Value Ref Range   CRP 32.7 (H) <1.0 mg/dL    Comment: Performed at [facility], [place] [address] [place], [place] [address]  Ferritin     Status: Abnormal   Collection Time: 07/04/18  2:15 PM  Result Value Ref Range   Ferritin 1,106 (H) 11 - 307 ng/mL    Comment: Performed at [facility], [place] [address], [place], [place] [address]  Folate     Status: None   Collection Time: 07/04/18  2:15 PM  Result Value Ref Range  Folate 13.7 >5.9 ng/mL    Comment: Performed at [facility], [place] [address] [place], [place] [address]  Iron and TIBC     Status: None   Collection Time: 07/04/18  2:15 PM  Result Value Ref Range   Iron 58 28 - 170 ug/dL   TIBC  324 250 - 450 ug/dL   Saturation Ratios 18 10.4 - 31.8 %   UIBC 266 ug/dL    Comment: Performed at [facility], [place] [address], [place], [place] [address]  Vitamin B12     Status: None   Collection Time: 07/04/18  2:15 PM  Result Value Ref Range   Vitamin B-12 725 180 - 914 pg/mL    Comment: (NOTE) This assay is not validated for testing neonatal or myeloproliferative syndrome specimens for Vitamin B12 levels. Performed at [facility], [place] [address], [place], [place] [address]   Type and screen     Status: None   Collection Time: 07/04/18  2:31 PM  Result Value Ref Range   ABO/RH(D) O POS    Antibody Screen NEG    Sample Expiration      07/07/2018,2359 Performed at [facility], [place] [address], [place], [place] [address]   Lactic acid, plasma     Status: Abnormal   Collection Time: 07/04/18  2:33 PM  Result Value Ref Range   Lactic Acid, Venous 2.2 (HH) 0.5 - 1.9 mmol/L    Comment: CRITICAL RESULT CALLED TO, READ BACK BY AND VERIFIED WITH: A.KHALID AT 1511 ON 07/04/18 BY N.THOMPSON Performed at [facility], [place] [address], [place], [place] [address]   Lactate dehydrogenase     Status: Abnormal   Collection Time: 07/04/18  2:40 PM  Result Value Ref Range   LDH 595 (H) 98 - 192 U/L    Comment: Performed at [facility], [place] [address], [place], [place] [address]  Reticulocytes     Status: Abnormal   Collection Time: 07/04/18  2:45 PM  Result Value Ref Range   Retic Ct Pct 2.0 0.4 - 3.1 %   RBC. 1.54 (L) 3.87 - 5.11 MIL/uL   Retic Count, Absolute 30.2 19.0 - 186.0 K/uL   Immature Retic Fract 26.4 (H) 2.3 - 15.9 %    Comment: Performed at [facility], [place] [address], [place], [place] [address]  Save Smear     Status: None   Collection Time: 07/04/18  2:47 PM  Result Value Ref Range   Smear Review SMEAR STAINED AND AVAILABLE FOR REVIEW     Comment: Performed  at [facility], [place] [address] [place], [place] [address]  Prepare RBC     Status: None   Collection Time: 07/04/18  3:03 PM  Result Value Ref Range   Order Confirmation      ORDER PROCESSED BY BLOOD BANK Performed at [place] [address] [place], [place] [address]    Dg Chest Portable 1 View  Result Date: 07/04/2018 CLINICAL DATA:  Worsening shortness of breath and cough EXAM: PORTABLE CHEST 1 VIEW COMPARISON:  06/18/2018 FINDINGS: There is bilateral diffuse reticular interstitial thickening. There is no focal consolidation. There is no pleural effusion or pneumothorax. The heart and mediastinal contours are unremarkable. The osseous structures are unremarkable. IMPRESSION: Bilateral diffuse reticular interstitial thickening which may reflect mild interstitial edema versus pneumonia including atypical pneumonia. Electronically Signed   By: Kathreen Devoid  On: 07/04/2018 12:57    Pending Labs Unresulted Labs (From admission, onward)    Start     Ordered   07/04/18 1448  Direct antiglobulin test  Once,   STAT     07/04/18 1447   07/04/18 1430  Haptoglobin  Once,   STAT     07/04/18 1430   07/04/18 1415  D-dimer, quantitative (not at [facility])  Add-on,   R     07/04/18 1414   07/04/18 1304  Blood Culture (routine x 2)  BLOOD CULTURE X 2,   STAT     07/04/18 1303   07/04/18 1210  Urinalysis, Routine w reflex microscopic  ONCE - STAT,   STAT     07/04/18 1210          Vitals/Pain Today's Vitals   07/04/18 1400 07/04/18 1430 07/04/18 1500 07/04/18 1530  BP: 92/67 95/60 105/62 95/64  Pulse: (!) 126 (!) 124 (!) 128 (!) 122  Resp: (!) 37 (!) 34 (!) 34 (!) 37  Temp:      TempSrc:      SpO2: 97% 97% 96% 97%  Weight:      Height:      PainSc:        Isolation Precautions No active isolations  Medications Medications  0.9 %  sodium chloride infusion (Manually program via Guardrails IV Fluids) (has no administration in time range)   HYDROcodone-homatropine (HYCODAN) 5-1.5 MG/5ML syrup 5 mL (has no administration in time range)  HYDROcodone-acetaminophen (NORCO) 7.5-325 MG per tablet 1 tablet (has no administration in time range)  sodium chloride flush (NS) 0.9 % injection 3 mL (3 mLs Intravenous Given 07/04/18 1236)  vancomycin (VANCOCIN) IVPB 1000 mg/200 mL premix (0 mg Intravenous Stopped 07/04/18 1434)  ceFEPIme (MAXIPIME) 2 g in sodium chloride 0.9 % 100 mL IVPB (0 g Intravenous Stopped 07/04/18 1435)  azithromycin (ZITHROMAX) 500 mg in sodium chloride 0.9 % 250 mL IVPB (0 mg Intravenous Stopped 07/04/18 1435)  lactated ringers bolus 1,000 mL (0 mLs Intravenous Stopped 07/04/18 1404)    And  lactated ringers bolus 500 mL (0 mLs Intravenous Stopped 07/04/18 1350)    And  lactated ringers bolus 250 mL (0 mLs Intravenous Stopped 07/04/18 1342)    Mobility walks Moderate fall risk   Focused Assessments Pulmonary Assessment Handoff:  Lung sounds:   O2 Device: Nasal Cannula O2 Flow Rate (L/min): 3 L/min      R Recommendations: See Admitting Provider Note  Report given to:   Additional Notes:

## 2018-07-04 NOTE — ED Provider Notes (Addendum)
[facility] DEPT Provider Note   CSN: [id number] Arrival date & time: 07/04/18  1153    History   Chief Complaint Chief Complaint  Patient presents with  . Shortness of Breath    HPI Krystal Short is a 33 y.o. female.  33 year old female comes in a chief complaint of shortness of breath. Patient has history of HIV AIDS, on dapsone for PCP pneumonia and anemia.  Patient comes in with chief complaint of shortness of breath and cough.  She has been seeing her ID doctors and has been put on antibiotics.  She reports that over the past few days she has shortness of breath with slightest of exertion.  She also has a cough that is mostly nonproductive.  Patient was having fevers, but they have resolved.  She has been on antibiotics, but not improving.  She denies any wheezing, blood loss. Pt has no hx of PE, DVT and denies any exogenous hormone (testosterone / estrogen) use, long distance travels or surgery in the past 6 weeks, active cancer, recent immobilization.   Past Medical History:  Diagnosis Date  . Acute hyponatremia 06/03/2017  . Anemia   . CAP (community acquired pneumonia) 06/03/2017  . Community acquired pneumonia 06/05/2017  . Facial dermatitis 06/03/2017  . HIV (human immunodeficiency virus infection) ([place])   . Pleurisy 06/03/2017  . Pneumonia 06/06/2017  . 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 ([place])   . Thrush of mouth and esophagus ([place])   . UTI (urinary tract infection)     Patient Active Problem List   Diagnosis Date Noted  . Acute respiratory failure with hypoxia ([place]) 07/04/2018  . Symptomatic anemia 06/10/2018  . Thrombocytopenia ([place]) 06/10/2018  . ASCUS with positive high risk HPV cervical 06/10/2018  . Pneumonia 05/28/2018  . DOE (dyspnea on exertion) 05/28/2018  . Pneumocystis jiroveci pneumonia ([place]) 03/08/2018  . Medication monitoring encounter 12/31/2017  . Cough 08/15/2017  . Molluscum contagiosum  06/27/2017  . AIDS ([place])   . 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 ([place])   . Esophageal candidiasis ([place])   . Tachycardia 06/03/2017    Past Surgical History:  Procedure Laterality Date  . NO PAST SURGERIES       OB History    Gravida  0   Para  0   Term  0   Preterm  0   AB  0   Living  0     SAB  0   TAB  0   Ectopic  0   Multiple  0   Live Births  0            Home Medications    Prior to Admission medications   Medication Sig Start Date End Date Taking? Authorizing Provider  acetaminophen (TYLENOL) 325 MG tablet Take 650 mg by mouth every 6 (six) hours as needed for mild pain or headache.   Yes [provider]  bictegravir-emtricitabine-tenofovir AF (BIKTARVY) 50-200-25 MG TABS tablet Take 1 tablet by mouth daily. 06/20/18  Yes Golden Circle, FNP  dapsone 100 MG tablet Take 1 tablet (100 mg total) by mouth daily. 06/20/18  Yes Golden Circle, FNP  diphenhydrAMINE-PE-APAP (THERAFLU EXPRESSMAX) 12.5-5-325 MG/15ML LIQD Take 15 mLs by mouth as needed (flu like symptoms).   Yes [provider]  sodium-potassium bicarbonate (ALKA-SELTZER GOLD) TBEF dissolvable tablet Take 1 tablet by mouth daily as needed (headache/pain).   Yes [provider]  ondansetron (ZOFRAN) 4 MG tablet  Take 1 tablet (4 mg total) by mouth every 6 (six) hours. Patient not taking: Reported on 07/04/2018 05/24/18   Raylene Everts, MD    Family History Family History  Problem Relation Age of Onset  . Healthy Father   . Healthy Mother   . Asthma Paternal Grandmother     Social History Social History   Tobacco Use  . Smoking status: Former Smoker    Packs/day: 0.25    Years: 8.00    Pack years: 2.00    Types: Cigarettes, Cigars    Last attempt to quit: 12/25/2016    Years since quitting: 1.5  . Smokeless tobacco: Never Used  Substance Use Topics  . Alcohol use: Yes    Comment: weekend/socially  . Drug use: No      Allergies   Heparin   Review of Systems Review of Systems  Constitutional: Positive for activity change.  Respiratory: Positive for cough and shortness of breath.   Gastrointestinal: Negative for nausea and vomiting.  Allergic/Immunologic: Positive for immunocompromised state.  Hematological: Does not bruise/bleed easily.  All other systems reviewed and are negative.    Physical Exam Updated Vital Signs BP 91/66   Pulse 93   Temp 97.7 F (36.5 C) (Oral)   Resp (!) 25   Ht 5\' 1"  (1.549 m)   Wt 56.7 kg   LMP  (LMP Unknown) Comment: depo injection this week (06/20/18)  SpO2 93%   BMI 23.62 kg/m   Physical Exam Vitals signs and nursing note reviewed.  Constitutional:      Appearance: She is well-developed.  HENT:     Head: Normocephalic and atraumatic.  Eyes:     Conjunctiva/sclera: Conjunctivae normal.     Pupils: Pupils are equal, round, and reactive to light.  Neck:     Musculoskeletal: Normal range of motion and neck supple.  Cardiovascular:     Rate and Rhythm: Regular rhythm. Tachycardia present.     Heart sounds: Normal heart sounds.  Pulmonary:     Effort: Pulmonary effort is normal. Tachypnea present. No respiratory distress.     Breath sounds: Normal breath sounds.  Abdominal:     General: Bowel sounds are normal. There is no distension.     Palpations: Abdomen is soft.     Tenderness: There is no abdominal tenderness. There is no guarding or rebound.  Skin:    General: Skin is warm and dry.  Neurological:     Mental Status: She is alert and oriented to person, place, and time.      ED Treatments / Results  Labs (all labs ordered are listed, but only abnormal results are displayed) Labs Reviewed  LACTIC ACID, PLASMA - Abnormal; Notable for the following components:      Result Value   Lactic Acid, Venous 2.6 (*)    All other components within normal limits  LACTIC ACID, PLASMA - Abnormal; Notable for the following components:   Lactic Acid,  Venous 2.2 (*)    All other components within normal limits  COMPREHENSIVE METABOLIC PANEL - Abnormal; Notable for the following components:   Sodium 128 (*)    Chloride 94 (*)    Glucose, Bld 116 (*)    BUN 26 (*)    Creatinine, Ser 1.13 (*)    Calcium 7.4 (*)    Albumin 2.4 (*)    Alkaline Phosphatase 153 (*)    Total Bilirubin 1.4 (*)    All other components within normal limits  CBC WITH  DIFFERENTIAL/PLATELET - Abnormal; Notable for the following components:   RBC 1.57 (*)    Hemoglobin 5.1 (*)    HCT 16.8 (*)    MCV 107.0 (*)    RDW 22.1 (*)    Platelets 14 (*)    nRBC 1.4 (*)    Basophils Absolute 0.2 (*)    All other components within normal limits  URINALYSIS, ROUTINE W REFLEX MICROSCOPIC - Abnormal; Notable for the following components:   APPearance HAZY (*)    All other components within normal limits  LACTATE DEHYDROGENASE - Abnormal; Notable for the following components:   LDH 595 (*)    All other components within normal limits  C-REACTIVE PROTEIN - Abnormal; Notable for the following components:   CRP 32.7 (*)    All other components within normal limits  FERRITIN - Abnormal; Notable for the following components:   Ferritin 1,106 (*)    All other components within normal limits  RETICULOCYTES - Abnormal; Notable for the following components:   RBC. 1.54 (*)    Immature Retic Fract 26.4 (*)    All other components within normal limits  COMPREHENSIVE METABOLIC PANEL - Abnormal; Notable for the following components:   Sodium 130 (*)    CO2 21 (*)    Glucose, Bld 132 (*)    Calcium 7.1 (*)    Total Protein 6.2 (*)    Albumin 2.0 (*)    All other components within normal limits  CBC - Abnormal; Notable for the following components:   RBC 2.85 (*)    Hemoglobin 9.0 (*)    HCT 27.3 (*)    RDW 20.4 (*)    Platelets 11 (*)    nRBC 0.5 (*)    All other components within normal limits  IMMATURE PLATELET FRACTION - Abnormal; Notable for the following  components:   Immature Platelet Fraction 13.2 (*)    All other components within normal limits  CBC WITH DIFFERENTIAL/PLATELET - Abnormal; Notable for the following components:   RBC 2.59 (*)    Hemoglobin 8.0 (*)    HCT 24.8 (*)    RDW 21.0 (*)    Platelets 9 (*)    nRBC 0.4 (*)    Monocytes Absolute 2.1 (*)    Abs Immature Granulocytes 0.93 (*)    All other components within normal limits  RETICULOCYTES - Abnormal; Notable for the following components:   RBC. 2.59 (*)    Immature Retic Fract 19.0 (*)    All other components within normal limits  DIC (DISSEMINATED INTRAVASCULAR COAGULATION) PANEL - Abnormal; Notable for the following components:   Prothrombin Time 17.2 (*)    INR 1.4 (*)    Fibrinogen 738 (*)    D-Dimer, Quant 1.54 (*)    Platelets 9 (*)    All other components within normal limits  SARS CORONAVIRUS 2 (HOSPITAL ORDER, [facility])  MRSA PCR SCREENING  CULTURE, BLOOD (ROUTINE X 2)  CULTURE, BLOOD (ROUTINE X 2)  ACID FAST SMEAR (AFB)  ACID FAST CULTURE WITH REFLEXED SENSITIVITIES  D-DIMER, QUANTITATIVE (NOT AT [facility])  FOLATE  IRON AND TIBC  VITAMIN B12  SAVE SMEAR ([facility])  HAPTOGLOBIN  CBC  I-STAT BETA HCG BLOOD, ED ([facility], [facility], [facility] ONLY)  TYPE AND SCREEN  DIRECT ANTIGLOBULIN TEST (NOT AT [facility])  PREPARE RBC (CROSSMATCH)    EKG None  Date: 07/04/2018  Rate: 124  Rhythm: normal sinus rhythm  QRS Axis: normal  Intervals: normal  ST/T Wave  abnormalities: normal  Conduction Disutrbances: none  Narrative Interpretation: unremarkable  Nonspecific ST and T wave changes. No acute changes.   Radiology Ct Chest W Contrast  Result Date: 07/04/2018 CLINICAL DATA:  Increased shortness of breath EXAM: CT CHEST WITH CONTRAST TECHNIQUE: Multidetector CT imaging of the chest was performed during intravenous contrast administration. CONTRAST:  57mL OMNIPAQUE IOHEXOL 300 MG/ML  SOLN COMPARISON:  05/28/2018 FINDINGS: Cardiovascular: No  significant vascular findings. Normal heart size. No pericardial effusion. Mediastinum/Nodes: Stable mediastinal and bilateral hilar lymphadenopathy measuring up to 17 mm in short axis along the right paratracheal location. Normal trachea. Normal esophagus. Normal thyroid gland. Lungs/Pleura: Diffuse bilateral interstitial thickening and fissural thickening. Peripheral sub pleural thickening again noted bilaterally. Abnormality involves bilateral upper and lower lungs with the lung bases least affected. Trace left pleural effusion. No right pleural effusion. Upper Abdomen: No acute abnormality. Musculoskeletal: No chest wall abnormality. No acute or significant osseous findings. IMPRESSION: 1. Worsening diffuse bilateral interstitial and fissural thickening with sub pleural thickening and scattered areas of ground-glass opacity. Differential considerations include chronic interstitial lung disease such as sarcoidosis versus atypical infection given the patient's history of HIV. Electronically Signed   By: Kathreen Devoid   On: 07/04/2018 18:34   Dg Chest Portable 1 View  Result Date: 07/04/2018 CLINICAL DATA:  Worsening shortness of breath and cough EXAM: PORTABLE CHEST 1 VIEW COMPARISON:  06/18/2018 FINDINGS: There is bilateral diffuse reticular interstitial thickening. There is no focal consolidation. There is no pleural effusion or pneumothorax. The heart and mediastinal contours are unremarkable. The osseous structures are unremarkable. IMPRESSION: Bilateral diffuse reticular interstitial thickening which may reflect mild interstitial edema versus pneumonia including atypical pneumonia. Electronically Signed   By: Kathreen Devoid   On: 07/04/2018 12:57    Procedures .Critical Care Performed by: Varney Biles, MD Authorized by: Varney Biles, MD   Critical care provider statement:    Critical care time (minutes):  52   Critical care was necessary to treat or prevent imminent or life-threatening  deterioration of the following conditions:  Respiratory failure (Anemia and thrombocytopenia)   Critical care was time spent personally by me on the following activities:  Discussions with consultants, evaluation of patient's response to treatment, examination of patient, ordering and performing treatments and interventions, ordering and review of laboratory studies, ordering and review of radiographic studies, pulse oximetry, re-evaluation of patient's condition, obtaining history from patient or surrogate and review of old charts   (including critical care time)  Medications Ordered in ED Medications  0.9 %  sodium chloride infusion (Manually program via Guardrails IV Fluids) (has no administration in time range)  HYDROcodone-homatropine (HYCODAN) 5-1.5 MG/5ML syrup 5 mL (has no administration in time range)  HYDROcodone-acetaminophen (NORCO) 7.5-325 MG per tablet 1 tablet (has no administration in time range)  predniSONE (DELTASONE) tablet 40 mg (40 mg Oral Given 07/05/18 0847)  bictegravir-emtricitabine-tenofovir AF (BIKTARVY) 50-200-25 MG per tablet 1 tablet (1 tablet Oral Given 07/05/18 0847)  ondansetron (ZOFRAN) tablet 4 mg (has no administration in time range)    Or  ondansetron (ZOFRAN) injection 4 mg (has no administration in time range)  0.9 %  sodium chloride infusion ( Intravenous New Bag/Given 07/05/18 0849)  sodium chloride (PF) 0.9 % injection (has no administration in time range)  MEDLINE mouth rinse (15 mLs Mouth Rinse Given 07/05/18 0848)  Chlorhexidine Gluconate Cloth 2 % PADS 6 each (6 each Topical Given 07/05/18 0527)  sulfamethoxazole-trimethoprim (BACTRIM DS) 800-160 MG per tablet 2 tablet (2 tablets Oral  Given 02/20/50 0802)  folic acid (FOLVITE) tablet 2 mg (2 mg Oral Given 07/05/18 0847)  B-complex with vitamin C tablet 1 tablet (1 tablet Oral Given 07/05/18 0847)  sodium chloride flush (NS) 0.9 % injection 3 mL (3 mLs Intravenous Given 07/04/18 1236)  vancomycin (VANCOCIN)  IVPB 1000 mg/200 mL premix (0 mg Intravenous Stopped 07/04/18 1434)  ceFEPIme (MAXIPIME) 2 g in sodium chloride 0.9 % 100 mL IVPB (0 g Intravenous Stopped 07/04/18 1435)  azithromycin (ZITHROMAX) 500 mg in sodium chloride 0.9 % 250 mL IVPB (0 mg Intravenous Stopped 07/04/18 1435)  lactated ringers bolus 1,000 mL (0 mLs Intravenous Stopped 07/04/18 1404)    And  lactated ringers bolus 500 mL (0 mLs Intravenous Stopped 07/04/18 1350)    And  lactated ringers bolus 250 mL (0 mLs Intravenous Stopped 07/04/18 1342)  iohexol (OMNIPAQUE) 300 MG/ML solution 75 mL (75 mLs Intravenous Contrast Given 07/04/18 1753)     Initial Impression / Assessment and Plan / ED Course  I have reviewed the triage vital signs and the nursing notes.  Pertinent labs & imaging results that were available during my care of the patient were reviewed by me and considered in my medical decision making (see chart for details).    Patient comes in a chief complaint of shortness of breath and cough. Has history of HIV AIDS, on dapsone for PCP prophylaxis.  Also has had admissions for respiratory complaints recently and has been on outpatient antibiotics.  She is noted to be tachycardic, tachypneic.  She reports exertional shortness of breath along with a cough that is producing clear phlegm and fevers that have now resolved.   Differential diagnosis includes PE, pneumothorax, large pleural effusion, pneumonia, pulmonary edema, severe anemia.  Lab results indicated that patient has in fact significant anemia.  She continues to state that there is no blood loss.  She had required transfusion this year.  We will send out anemia panel.  She will need blood transfusion and has been consented.  Additionally she is noted to have thrombocytopenia, mild AKI, mildly elevated bilirubin.  We will also send out work-up for TTP.  I spoke with Dr. Irene Limbo, hematology.  Given that patient has history of HIV and is on medications for that, is on  dapsone, has a viral infection-like symptoms -it appears less likely that patient has TTP.  For now he wants [place] to send out a peripheral smear.  He will see the patient.  Medicine has been called for admission.    Final Clinical Impressions(s) / ED Diagnoses   Final diagnoses:  Thrombocytopenia ([place])  Anemia, unspecified type  Other pancytopenia [facility])    ED Discharge Orders    None       Varney Biles, MD 07/05/18 2336    Varney Biles, MD 07/05/18 0930

## 2018-07-04 NOTE — Consult Note (Addendum)
[place]  [phone] [phone]    [place]  Referring MD:  Dr. Cordelia Poche  Reason for Referral: Anemia and Thrombocytopenia  HPI: Ms. Hoff is a 33 year old female with a past medical history significant for AIDS (recent CD4 count was <35 on 06/20/2018),  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, questionable sarcoidosis (currently undergoing pulmonary work-up), ASCUS, and anemia. She is being followed by Dr. Janese Banks at the [facility] for her anemia and thrombocytopenia and currently undergoing workup. The patient has a long-standing history of anemia. Her highest hemoglobin on record is 12.2, but her hemoglobin typically is between 9-10. She reports receiving 1 blood transfusion in the past. Her platelets on 06/20/2018 were normal at 190,000, however, in April 2020 they did drop down into the 50,000 range. They have otherwise been normal.   The patient states that she had an e-visit with her PCP today and was advised to come to the ER. She reports shortness of breath and cough as her main complaint. She reports that she had a fever last week up to 103, but the fevers have now resolved. She denies chills. She is having chest discomfort at the time of my visit. Denies headaches and dizziness. No epistaxis, hemoptysis, hematemesis, hematuria, melena, hematochezia. No rashes or petechiae. Hematology was asked to see the patient for evaluation of her anemia and thrombocytopenia.   Past Medical History:  Diagnosis Date  . Acute hyponatremia 06/03/2017  . Anemia   . CAP (community acquired pneumonia) 06/03/2017  . Community acquired pneumonia 06/05/2017  . Facial dermatitis 06/03/2017  . HIV (human immunodeficiency virus infection) ([place])   . Pleurisy 06/03/2017  . Pneumonia 06/06/2017  . 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 ([place])   . Thrush of mouth and esophagus ([place])   . UTI (urinary tract  infection)   :    Past Surgical History:  Procedure Laterality Date  . NO PAST SURGERIES    :   CURRENT MEDS: Current Facility-Administered Medications  Medication Dose Route Frequency Provider Last Rate Last Dose  . 0.9 %  sodium chloride infusion (Manually program via Guardrails IV Fluids)   Intravenous Once [name], MD      . HYDROcodone-homatropine (HYCODAN) 5-1.5 MG/5ML syrup 5 mL  5 mL Oral Q6H PRN [name], MD       Current Outpatient Medications  Medication Sig Dispense Refill  . acetaminophen (TYLENOL) 325 MG tablet Take 650 mg by mouth every 6 (six) hours as needed for mild pain or headache.    . bictegravir-emtricitabine-tenofovir AF (BIKTARVY) 50-200-25 MG TABS tablet Take 1 tablet by mouth daily. 30 tablet 3  . dapsone 100 MG tablet Take 1 tablet (100 mg total) by mouth daily. 30 tablet 0  . diphenhydrAMINE-PE-APAP (THERAFLU EXPRESSMAX) 12.5-5-325 MG/15ML LIQD Take 15 mLs by mouth as needed (flu like symptoms).    . sodium-potassium bicarbonate (ALKA-SELTZER GOLD) TBEF dissolvable tablet Take 1 tablet by mouth daily as needed (headache/pain).    . ondansetron (ZOFRAN) 4 MG tablet Take 1 tablet (4 mg total) by mouth every 6 (six) hours. (Patient not taking: Reported on 07/04/2018) 12 tablet 0      Allergies  Allergen Reactions  . Heparin   :  Family History  Problem Relation Age of Onset  . Healthy Father   . Healthy Mother   . Asthma Paternal Grandmother   :  Social History   Socioeconomic History  .  Marital status: Married    Spouse name: Not on file  . Number of children: Not on file  . Years of education: college  . Highest education level: Bachelor's degree (e.g., BA, AB, BS)  Occupational History  . Occupation: [employer]    Employer: [employer]  . Financial resource strain: Not hard at all  . Food insecurity:    Worry: Never true    Inability: Never true  . Transportation needs:    Medical: No     Non-medical: No  Tobacco Use  . Smoking status: Former Smoker    Packs/day: 0.25    Years: 8.00    Pack years: 2.00    Types: Cigarettes, Cigars    Last attempt to quit: 12/25/2016    Years since quitting: 1.5  . Smokeless tobacco: Never Used  Substance and Sexual Activity  . Alcohol use: Yes    Comment: weekend/socially  . Drug use: No  . Sexual activity: Not Currently    Birth control/protection: Injection  Lifestyle  . Physical activity:    Days per week: 0 days    Minutes per session: 0 min  . Stress: Not at all  Relationships  . Social connections:    Talks on phone: Once a week    Gets together: Once a week    Attends religious service: More than 4 times per year    Active member of club or organization: No    Attends meetings of clubs or organizations: Never    Relationship status: Married  . Intimate partner violence:    Fear of current or ex partner: No    Emotionally abused: No    Physically abused: No    Forced sexual activity: No  Other Topics Concern  . Not on file  Social History Narrative   Patient does not drive, if her husband cannot provide transportation she takes uses Ly[employer] services   She has not smoked since 2018  :  REVIEW OF SYSTEM:  A comprehensive 14-point ROS was negative except as noted in the HPI.  Exam: Patient Vitals for the past 24 hrs:  BP Temp Temp src Pulse Resp SpO2 Height Weight  07/04/18 1530 95/64 - - (!) 122 (!) 37 97 % - -  07/04/18 1500 105/62 - - (!) 128 (!) 34 96 % - -  07/04/18 1430 95/60 - - (!) 124 (!) 34 97 % - -  07/04/18 1400 92/67 - - (!) 126 (!) 37 97 % - -  07/04/18 1330 103/71 - - (!) 132 (!) 42 97 % - -  07/04/18 1313 - - - - - - _0  (1.549 m) 125 lb (56.7 kg)  07/04/18 1300 99/70 - - (!) 135 (!) 30 100 % - -  07/04/18 1244 99/62 - - (!) 134 (!) 34 100 % - -  07/04/18 1205 106/63 99.1 F (37.3 C) Oral (!) 135 20 (!) 83 % - -    General:  Alert. No acute distress.  Skin exam was without ecchymosis,  petechiae.   Neuro exam was nonfocal. Patient was alert and oriented.  Attention was good.   Language was appropriate.  Mood was normal without depression.  Speech was not pressured.  Thought content was not tangential.    Full exam deferred due to COVID-19 pandemic.  LABS:  Lab Results  Component Value Date   WBC 6.6 07/04/2018   HGB 5.1 (LL) 07/04/2018   HCT 16.8 (L) 07/04/2018  PLT 14 (LL) 07/04/2018   GLUCOSE 116 (H) 07/04/2018   CHOL 56 04/02/2018   TRIG 312 (H) 05/28/2018   HDL <5 (L) 04/02/2018   [place]  04/02/2018     Comment:     Result not calculated because one or more required values exceed analytical limits. Reference range: <100 . Desirable range <100 mg/dL for primary prevention;   <70 mg/dL for patients with CHD or diabetic patients  with > or = 2 CHD risk factors. Marland Kitchen LDL-C is now calculated using the Martin-Hopkins  calculation, which is a validated novel method providing  better accuracy than the Friedewald equation in the  estimation of LDL-C.  Cresenciano Genre et al. Annamaria Helling. 8295;621(30): 2061-2068  (http://education.[employer].com/faq/FAQ164)    ALT 12 07/04/2018   AST 39 07/04/2018   NA 128 (L) 07/04/2018   K 4.1 07/04/2018   CL 94 (L) 07/04/2018   CREATININE 1.13 (H) 07/04/2018   BUN 26 (H) 07/04/2018   CO2 23 07/04/2018    Dg Chest 2 View  Result Date: 06/18/2018 CLINICAL DATA:  Dyspnea on exertion EXAM: CHEST - 2 VIEW COMPARISON:  05/28/2018 FINDINGS: Cardiac shadow is within normal limits. The reticulonodular densities seen on prior examination have nearly completely resolved. No sizable infiltrate or effusion is seen. No bony abnormality is noted. IMPRESSION: Near complete resolution of previously seen reticulonodular densities. Electronically Signed   By: Inez Catalina M.D.   On: 06/18/2018 15:47   Dg Chest Portable 1 View  Result Date: 07/04/2018 CLINICAL DATA:  Worsening shortness of breath and cough EXAM: PORTABLE CHEST 1 VIEW COMPARISON:   06/18/2018 FINDINGS: There is bilateral diffuse reticular interstitial thickening. There is no focal consolidation. There is no pleural effusion or pneumothorax. The heart and mediastinal contours are unremarkable. The osseous structures are unremarkable. IMPRESSION: Bilateral diffuse reticular interstitial thickening which may reflect mild interstitial edema versus pneumonia including atypical pneumonia. Electronically Signed   By: Kathreen Devoid   On: 07/04/2018 12:57    ASSESSMENT AND PLAN:  This is a 33 year old female with: 1. Macrocytic Anemia 2. Thrombocytopenia 3. Penumonia 4. Sepsis secondary to #3 5. AKI 6. HIV/AIDS    -The patient has a longstanding history of anemia.  Prior hematology evaluation thought this may be related to her underlying HIV.  Anemia and thrombocytopenia are uncommon side effects related to her HIV medication.  -She now has significant worsening of her anemia and thrombocytopenia -possibly related to her underlying sepsis and acute infection.  Concern for TTP is lower.  Absolute reticulocyte count is normal at 30.2 and immature reticulocyte fraction is elevated at 26.4%  LDH is elevated at 595.  It was also elevated 1 month ago at 566.  Await additional work-up including ferritin, iron studies, folate, vitamin B12 level, haptoglobin. -We will review peripheral smear. -Recommend packed red blood cell transfusion for hemoglobin less than 7.0 or active bleeding. -Recommend platelet transfusion for platelet count of less than 10,000 or active bleeding. -Treatment of pneumonia per hospitalist. -Hydration per hospitalist team.   Thank you for this referral.  Mikey Bussing, DNP, AGPCNP-BC, AOCNP   ADDENDUM  Patient with sever HIV/AIDS with CD4 count<50 with fevers, chills, new worsening severe anemia and thrombocytopenia. She has had recurrent respiratory infections. Recent CT chest showed mediastinal LNadenopathy and lung infiltrates concern for atypical  infection vs sarcoidosis vs lymphoma.  Hgb is 5.1 with macrocytosis. With severe acute thrombocytopenia plt 14k. PBS (personally reviewed by me)- no schistocytes to suggest TTP. Polychromasia noted with  some Bite cells . Coombs test neg making AIHA less likely. Concern for possible Dapsone related hemolysis. Patient has also being on high dose of bactrim which can cause anemia and thrombocytopenia.  HIV/AIDS itself can cause cytopenias but the rapidity and time frame is a little unusual Anemia and thrombocytopenia could be from infection/sepsis or other viral infection. covid 19 neg. MAC infection is also a possibility Elevated LDH - has been persistent and does not appear to clearly co-relate with hemolysis.   -no smear evidence of TTP/MAHA -hold dapsone -hold bactrim if not strongly indicated. -workup and mx of respiratory  of infection and fevers per hospitalist - consider ID and pulmonary input. -CT guided bone marrow biopsy/aspiration to r/o BM infiltrative process, MAC, other etiologies. -watch for bleeding -B complex and folic acid replacement to counteract BM suppression by bactrim -transfuse PRBC prn for hgb<7 or if bleeding or symptomatic. -transfuse platelets for PLT<10k or if bleeding. -will continue to follow  Sullivan Lone MD MS

## 2018-07-04 NOTE — H&P (Addendum)
History and Physical    Krystal Short ZJQ:[id number] DOB: July 23, 1985 DOA: 07/04/2018  PCP: [place], [place] Patient coming from: Home  Chief Complaint: Shortness of breath  HPI: Krystal Short is a 33 y.o. female with medical history significant of HIV/AIDS, anemia, thrombocytopenia. Patient presents secondary to worsening dyspnea on exertion, cough. She has had worsening symptoms for the past week. Similar to previous presentation one month prior. Cough is not productive. She reports a fever as high as 103 about 1.5 weeks ago. No known sick contacts. She has been tested multiple times for COVID-19.  ED Course: Vitals: Afebrile, pulse of 110s to 120s, respirations of mid 20s to low 30s, BP of 105/60, SpO2 mid to high 90s via [place] Labs: Sodium of 128, chloride of 94, creatinine of 1.13, hemoglobin of 5.1, platelets of 14 Imaging: Chest x-ray significant for diffuse interstitial thickening concerning for atypical pneumonia vs edema Medications/Course: Vancomycin, cefepime, azithromycin, 1.75 L LR bolus  Review of Systems: Review of Systems  Constitutional: Positive for fever.  Respiratory: Positive for cough and shortness of breath. Negative for sputum production.   Cardiovascular: Negative for chest pain.  Gastrointestinal: Positive for nausea and vomiting. Negative for abdominal pain, constipation and diarrhea.  All other systems reviewed and are negative.   Past Medical History:  Diagnosis Date  . Acute hyponatremia 06/03/2017  . Anemia   . CAP (community acquired pneumonia) 06/03/2017  . Community acquired pneumonia 06/05/2017  . Facial dermatitis 06/03/2017  . HIV (human immunodeficiency virus infection) ([place])   . Pleurisy 06/03/2017  . Pneumonia 06/06/2017  . 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 ([place])   . Thrush of mouth and esophagus ([place])   . UTI (urinary tract infection)     Past Surgical History:  Procedure Laterality Date  . NO PAST  SURGERIES       reports that she quit smoking about 18 months ago. Her smoking use included cigarettes and cigars. She has a 2.00 pack-year smoking history. She has never used smokeless tobacco. She reports current alcohol use. She reports that she does not use drugs.  Allergies  Allergen Reactions  . Heparin     Family History  Problem Relation Age of Onset  . Healthy Father   . Healthy Mother   . Asthma Paternal Grandmother    Prior to Admission medications   Medication Sig Start Date End Date Taking? Authorizing Provider  acetaminophen (TYLENOL) 325 MG tablet Take 650 mg by mouth every 6 (six) hours as needed for mild pain or headache.   Yes [provider]  bictegravir-emtricitabine-tenofovir AF (BIKTARVY) 50-200-25 MG TABS tablet Take 1 tablet by mouth daily. 06/20/18  Yes Golden Circle, FNP  dapsone 100 MG tablet Take 1 tablet (100 mg total) by mouth daily. 06/20/18  Yes Golden Circle, FNP  diphenhydrAMINE-PE-APAP (THERAFLU EXPRESSMAX) 12.5-5-325 MG/15ML LIQD Take 15 mLs by mouth as needed (flu like symptoms).   Yes [provider]  sodium-potassium bicarbonate (ALKA-SELTZER GOLD) TBEF dissolvable tablet Take 1 tablet by mouth daily as needed (headache/pain).   Yes [provider]  ondansetron (ZOFRAN) 4 MG tablet Take 1 tablet (4 mg total) by mouth every 6 (six) hours. Patient not taking: Reported on 07/04/2018 05/24/18   Raylene Everts, MD    Physical Exam:  Physical Exam Constitutional:      General: She is not in acute distress.    Appearance: She is well-developed. She is not ill-appearing or diaphoretic.  Eyes:  Conjunctiva/sclera: Conjunctivae normal.     Pupils: Pupils are equal, round, and reactive to light.  Neck:     Musculoskeletal: Normal range of motion.  Cardiovascular:     Rate and Rhythm: Regular rhythm. Tachycardia present.     Heart sounds: Normal heart sounds. No murmur.  Pulmonary:     Effort: Pulmonary effort  is normal. Tachypnea present. No respiratory distress.     Breath sounds: Decreased breath sounds and rales present. No wheezing.  Abdominal:     General: Bowel sounds are normal. There is no distension.     Palpations: Abdomen is soft.     Tenderness: There is no abdominal tenderness. There is no guarding or rebound.  Musculoskeletal: Normal range of motion.        General: No tenderness.  Lymphadenopathy:     Cervical: No cervical adenopathy.  Skin:    General: Skin is warm and dry.     Findings: Rash present. Rash is papular (face).  Neurological:     Mental Status: She is alert and oriented to person, place, and time.  Psychiatric:        Mood and Affect: Mood and affect normal.      Labs on Admission: I have personally reviewed following labs and imaging studies  CBC: Recent Labs  Lab 07/04/18 1254  WBC 6.6  NEUTROABS 4.1  HGB 5.1*  HCT 16.8*  MCV 107.0*  PLT 14*    Basic Metabolic Panel: Recent Labs  Lab 07/04/18 1254  NA 128*  K 4.1  CL 94*  CO2 23  GLUCOSE 116*  BUN 26*  CREATININE 1.13*  CALCIUM 7.4*    GFR: Estimated Creatinine Clearance: 54.4 mL/min (A) (by C-G formula based on SCr of 1.13 mg/dL (H)).  Liver Function Tests: Recent Labs  Lab 07/04/18 1254  AST 39  ALT 12  ALKPHOS 153*  BILITOT 1.4*  PROT 7.3  ALBUMIN 2.4*   No results for input(s): LIPASE, AMYLASE in the last 168 hours. No results for input(s): AMMONIA in the last 168 hours.  Coagulation Profile: No results for input(s): INR, PROTIME in the last 168 hours.  Cardiac Enzymes: No results for input(s): CKTOTAL, CKMB, CKMBINDEX, TROPONINI in the last 168 hours.  BNP (last 3 results) No results for input(s): PROBNP in the last 8760 hours.  HbA1C: No results for input(s): HGBA1C in the last 72 hours.  CBG: No results for input(s): GLUCAP in the last 168 hours.  Lipid Profile: No results for input(s): CHOL, HDL, LDLCALC, TRIG, CHOLHDL, LDLDIRECT in the last 72 hours.   Thyroid Function Tests: No results for input(s): TSH, T4TOTAL, FREET4, T3FREE, THYROIDAB in the last 72 hours.  Anemia Panel: Recent Labs    07/02/18 1122 07/04/18 1445  RETICCTPCT 2.0 2.0    Urine analysis:    Component Value Date/Time   COLORURINE YELLOW 05/30/2018 0825   APPEARANCEUR CLEAR 05/30/2018 0825   LABSPEC 1.014 05/30/2018 0825   PHURINE 6.0 05/30/2018 0825   GLUCOSEU NEGATIVE 05/30/2018 0825   HGBUR NEGATIVE 05/30/2018 0825   BILIRUBINUR NEGATIVE 05/30/2018 0825   KETONESUR NEGATIVE 05/30/2018 0825   PROTEINUR NEGATIVE 05/30/2018 0825   UROBILINOGEN 1.0 12/03/2011 0550   NITRITE NEGATIVE 05/30/2018 0825   LEUKOCYTESUR NEGATIVE 05/30/2018 0825     Radiological Exams on Admission: Dg Chest Portable 1 View  Result Date: 07/04/2018 CLINICAL DATA:  Worsening shortness of breath and cough EXAM: PORTABLE CHEST 1 VIEW COMPARISON:  06/18/2018 FINDINGS: There is bilateral diffuse reticular interstitial thickening.  There is no focal consolidation. There is no pleural effusion or pneumothorax. The heart and mediastinal contours are unremarkable. The osseous structures are unremarkable. IMPRESSION: Bilateral diffuse reticular interstitial thickening which may reflect mild interstitial edema versus pneumonia including atypical pneumonia. Electronically Signed   By: Kathreen Devoid   On: 07/04/2018 12:57    EKG: Independently reviewed. Sinus tachycardia  Assessment/Plan Principal Problem:   Acute respiratory failure with hypoxia (HCC) Active Problems:   Tachycardia   AIDS (HCC)   Pneumocystis jiroveci pneumonia (HCC)   DOE (dyspnea on exertion)   Symptomatic anemia   Thrombocytopenia (HCC)   Acute respiratory failure Secondary to sarcoid flare vs recurrent PJP vs CAP. Patient tested this visit for SARS-Cov-2 which was negative; she has been tested multiple times. -Oxygen therapy to keep O2 sats greater than 92% -Continue to treat empirically for possible CAP vs HCAP   Abnormal chest x-ray Consistent with previous findings which were suggestive of atypical pneumonia vs sarcoidosis. Was previously treated with Bactrim and prednisone. She has completed her treatment with last dose taken about two week ago. Given Vancomycin, ceftriaxone and azithromycin in the ED -Will treat as PJP pneumonia vs sarcoidosis with Bactrim and steroids -ID consulted. Recommending Bactrim -CT chest  Dyspnea on exertion Secondary to above in addition to anemia.  Sepsis Present on admission. Source is pulmonary with possible pneumonia. Given empiric antibiotics in the ED. Blood cultures pending -Start Bactrim and steroids as mentioned above -Blood cultures pending  Acute on chronic anemia Patient has a history of iron deficiency anemia in addition to possible adverse effect from HIV therapy. Acutely worsened without evidence of extravascular bleeding. Bilirubin is mildly elevated -Transfuse 2 units of PRBC; post-transfusion CBC -CRP, ferritin, folate, iron, TIBC, vitamin B12, haptoglobin, d-dimer, LDH pending  Thrombocytopenia Acutely worsened. Thought previously secondary to pneumonia with some improvement earlier this month. Severe today. No spontaneous bleeding noted. Associated worsened anemia. On dapsone as an outpatient. Discussed with hematology and lower concern for TTP at this moment -SCDs -Hematology recommendations pending -Labs as mentioned above  Acute kidney injury Likely secondary to some dehydration/hypovolemia. Mild. -IV fluids -AM BMP; watch while on Bactrim treatment  AIDS/HIV Last viral load was undetectable, however, last CD4 count was also low at <35. Not currently on Bactrim. Patient is adherent with regimen -Continue Biktarvy   DVT prophylaxis: SCDs Code Status: Full code Family Communication: None Disposition Plan: Stepdown unit Consults called: Hematology, Infectious disease Admission status: Inpatient   Cordelia Poche, MD [facility] 07/04/2018, 3:24 PM  If 7PM-7AM, please contact night-coverage [email or web address] Password [email or web address]

## 2018-07-04 NOTE — ED Notes (Signed)
Lab called, pt has lactic acid 2.6, RN notified

## 2018-07-04 NOTE — ED Notes (Signed)
Per PCP-recently discharged for same symptoms-states patient felt better after d/c although recently started spiking a fever, dyspneic-tested neg for Covid-history of HIV, PNA-sending for eval

## 2018-07-05 ENCOUNTER — Other Ambulatory Visit: Payer: Self-pay

## 2018-07-05 ENCOUNTER — Inpatient Hospital Stay (HOSPITAL_COMMUNITY): Payer: 59

## 2018-07-05 ENCOUNTER — Inpatient Hospital Stay: Payer: 59 | Admitting: Oncology

## 2018-07-05 DIAGNOSIS — Z9889 Other specified postprocedural states: Secondary | ICD-10-CM

## 2018-07-05 DIAGNOSIS — D539 Nutritional anemia, unspecified: Secondary | ICD-10-CM

## 2018-07-05 DIAGNOSIS — E875 Hyperkalemia: Secondary | ICD-10-CM

## 2018-07-05 DIAGNOSIS — Z888 Allergy status to other drugs, medicaments and biological substances status: Secondary | ICD-10-CM

## 2018-07-05 DIAGNOSIS — R002 Palpitations: Secondary | ICD-10-CM

## 2018-07-05 DIAGNOSIS — Z87891 Personal history of nicotine dependence: Secondary | ICD-10-CM

## 2018-07-05 LAB — TYPE AND SCREEN
ABO/RH(D): O POS
Antibody Screen: NEGATIVE
Unit division: 0
Unit division: 0

## 2018-07-05 LAB — COMPREHENSIVE METABOLIC PANEL
ALT: 12 U/L (ref 0–44)
AST: 32 U/L (ref 15–41)
Albumin: 2 g/dL — ABNORMAL LOW (ref 3.5–5.0)
Alkaline Phosphatase: 104 U/L (ref 38–126)
Anion gap: 8 (ref 5–15)
BUN: 14 mg/dL (ref 6–20)
CO2: 21 mmol/L — ABNORMAL LOW (ref 22–32)
Calcium: 7.1 mg/dL — ABNORMAL LOW (ref 8.9–10.3)
Chloride: 101 mmol/L (ref 98–111)
Creatinine, Ser: 0.82 mg/dL (ref 0.44–1.00)
GFR calc Af Amer: >60 mL/min (ref >60)
GFR calc non Af Amer: >60 mL/min (ref >60)
Glucose, Bld: 132 mg/dL — ABNORMAL HIGH (ref 70–99)
Potassium: 4.3 mmol/L (ref 3.5–5.1)
Sodium: 130 mmol/L — ABNORMAL LOW (ref 135–145)
Total Bilirubin: 0.8 mg/dL (ref 0.3–1.2)
Total Protein: 6.2 g/dL — ABNORMAL LOW (ref 6.5–8.1)

## 2018-07-05 LAB — BPAM RBC
Blood Product Expiration Date: 202006232359
Blood Product Expiration Date: 202006232359
ISSUE DATE / TIME: 202005281655
ISSUE DATE / TIME: 202005281939
Unit Type and Rh: 5100
Unit Type and Rh: 5100

## 2018-07-05 LAB — CBC WITH DIFFERENTIAL/PLATELET
Abs Immature Granulocytes: 0.93 10*3/uL — ABNORMAL HIGH (ref 0.00–0.07)
Abs Immature Granulocytes: 0.71 10*3/uL — ABNORMAL HIGH (ref 0.00–0.07)
Basophils Absolute: 0 10*3/uL (ref 0.0–0.1)
Basophils Absolute: 0 10*3/uL (ref 0.0–0.1)
Basophils Relative: 0 %
Basophils Relative: 0 %
Eosinophils Absolute: 0 10*3/uL (ref 0.0–0.5)
Eosinophils Absolute: 0 10*3/uL (ref 0.0–0.5)
Eosinophils Relative: 0 %
Eosinophils Relative: 0 %
HCT: 24.8 % — ABNORMAL LOW (ref 36.0–46.0)
HCT: 25.6 % — ABNORMAL LOW (ref 36.0–46.0)
Hemoglobin: 8 g/dL — ABNORMAL LOW (ref 12.0–15.0)
Hemoglobin: 8 g/dL — ABNORMAL LOW (ref 12.0–15.0)
Immature Granulocytes: 10 %
Immature Granulocytes: 8 %
Lymphocytes Relative: 12 %
Lymphocytes Relative: 14 %
Lymphs Abs: 1.2 10*3/uL (ref 0.7–4.0)
Lymphs Abs: 1.3 10*3/uL (ref 0.7–4.0)
MCH: 30.9 pg (ref 26.0–34.0)
MCH: 30.8 pg (ref 26.0–34.0)
MCHC: 32.3 g/dL (ref 30.0–36.0)
MCHC: 31.3 g/dL (ref 30.0–36.0)
MCV: 95.8 fL (ref 80.0–100.0)
MCV: 98.5 fL (ref 80.0–100.0)
Monocytes Absolute: 2.1 10*3/uL — ABNORMAL HIGH (ref 0.1–1.0)
Monocytes Absolute: 2.2 10*3/uL — ABNORMAL HIGH (ref 0.1–1.0)
Monocytes Relative: 22 %
Monocytes Relative: 23 %
Neutro Abs: 5.5 10*3/uL (ref 1.7–7.7)
Neutro Abs: 5.2 10*3/uL (ref 1.7–7.7)
Neutrophils Relative %: 56 %
Neutrophils Relative %: 55 %
Platelets: 9 10*3/uL — CL (ref 150–400)
Platelets: 39 10*3/uL — ABNORMAL LOW (ref 150–400)
RBC: 2.59 MIL/uL — ABNORMAL LOW (ref 3.87–5.11)
RBC: 2.6 MIL/uL — ABNORMAL LOW (ref 3.87–5.11)
RDW: 21 % — ABNORMAL HIGH (ref 11.5–15.5)
RDW: 21.6 % — ABNORMAL HIGH (ref 11.5–15.5)
WBC: 9.7 10*3/uL (ref 4.0–10.5)
WBC: 9.3 10*3/uL (ref 4.0–10.5)
nRBC: 0.4 % — ABNORMAL HIGH (ref 0.0–0.2)
nRBC: 1.4 % — ABNORMAL HIGH (ref 0.0–0.2)

## 2018-07-05 LAB — RETICULOCYTES
Immature Retic Fract: 19 % — ABNORMAL HIGH (ref 2.3–15.9)
RBC.: 2.59 MIL/uL — ABNORMAL LOW (ref 3.87–5.11)
Retic Count, Absolute: 37.3 10*3/uL (ref 19.0–186.0)
Retic Ct Pct: 1.4 % (ref 0.4–3.1)

## 2018-07-05 LAB — CBC
HCT: 27.3 % — ABNORMAL LOW (ref 36.0–46.0)
Hemoglobin: 9 g/dL — ABNORMAL LOW (ref 12.0–15.0)
MCH: 31.6 pg (ref 26.0–34.0)
MCHC: 33 g/dL (ref 30.0–36.0)
MCV: 95.8 fL (ref 80.0–100.0)
Platelets: 11 10*3/uL — CL (ref 150–400)
RBC: 2.85 MIL/uL — ABNORMAL LOW (ref 3.87–5.11)
RDW: 20.4 % — ABNORMAL HIGH (ref 11.5–15.5)
WBC: 9.4 10*3/uL (ref 4.0–10.5)
nRBC: 0.5 % — ABNORMAL HIGH (ref 0.0–0.2)

## 2018-07-05 LAB — DIC (DISSEMINATED INTRAVASCULAR COAGULATION)PANEL
D-Dimer, Quant: 1.54 ug/mL-FEU — ABNORMAL HIGH (ref 0.00–0.50)
Fibrinogen: 738 mg/dL — ABNORMAL HIGH (ref 210–475)
INR: 1.4 — ABNORMAL HIGH (ref 0.8–1.2)
Platelets: 9 10*3/uL — CL (ref 150–400)
Prothrombin Time: 17.2 seconds — ABNORMAL HIGH (ref 11.4–15.2)
Smear Review: NONE SEEN
aPTT: 35 seconds (ref 24–36)

## 2018-07-05 LAB — IMMATURE PLATELET FRACTION: Immature Platelet Fraction: 13.2 % — ABNORMAL HIGH (ref 1.2–8.6)

## 2018-07-05 LAB — HAPTOGLOBIN: Haptoglobin: 251 mg/dL (ref 33–278)

## 2018-07-05 MED ORDER — FENTANYL CITRATE (PF) 100 MCG/2ML IJ SOLN
INTRAMUSCULAR | Status: AC
  Filled 2018-07-05: qty 2

## 2018-07-05 MED ORDER — FLUMAZENIL 0.5 MG/5ML IV SOLN
INTRAVENOUS | Status: AC
  Filled 2018-07-05: qty 5

## 2018-07-05 MED ORDER — MIDAZOLAM HCL 2 MG/2ML IJ SOLN
INTRAMUSCULAR | Status: AC | PRN
  Administered 2018-07-05 (×2): 1 mg via INTRAVENOUS

## 2018-07-05 MED ORDER — SODIUM CHLORIDE 0.9% IV SOLUTION: Freq: Once | INTRAVENOUS | Status: DC

## 2018-07-05 MED ORDER — LIDOCAINE-EPINEPHRINE (PF) 2 %-1:200000 IJ SOLN
INTRAMUSCULAR | Status: AC | PRN
  Administered 2018-07-05: 10 mL

## 2018-07-05 MED ORDER — DIPHENHYDRAMINE HCL 25 MG PO CAPS
25.0000 mg | ORAL_CAPSULE | Freq: Once | ORAL | Status: AC
  Administered 2018-07-05: 13:00:00 25 mg via ORAL
  Filled 2018-07-05: qty 1

## 2018-07-05 MED ORDER — MIDAZOLAM HCL 2 MG/2ML IJ SOLN
INTRAMUSCULAR | Status: AC
  Filled 2018-07-05: qty 2

## 2018-07-05 MED ORDER — NALOXONE HCL 0.4 MG/ML IJ SOLN
INTRAMUSCULAR | Status: AC
  Filled 2018-07-05: qty 1

## 2018-07-05 MED ORDER — ACETAMINOPHEN 325 MG PO TABS
650.0000 mg | ORAL_TABLET | Freq: Once | ORAL | Status: AC
  Administered 2018-07-05: 650 mg via ORAL
  Filled 2018-07-05: qty 2

## 2018-07-05 NOTE — Procedures (Signed)
Pre-procedure Diagnosis: Anemia and thrombocytopenia Post-procedure Diagnosis: Same  Technically successful CT guided bone marrow aspiration and biopsy of left iliac crest.   Complications: None Immediate  EBL: None  Signed: Sandi Mariscal Pager: [phone] 07/05/2018, 11:05 AM

## 2018-07-05 NOTE — Consult Note (Signed)
Chief Complaint: Patient was seen in consultation today for CT-guided bone marrow biopsy Chief Complaint  Patient presents with  . Shortness of Breath     Referring Physician(s): Kale,G  Supervising Physician: Sandi Mariscal  Patient Status: [facility] - In-pt  History of Present Illness: Krystal Short is a 33 y.o. female ex smoker  with past medical history significant for HIV/AIDS, pneumocystis pneumonia,?  sarcoidosis, anemia and thrombocytopenia.  She recently presented to [facility] with worsening dyspnea/cough, intermittent fevers.  Subsequent imaging revealed worsening diffuse bilateral interstitial and fissural thickening with subpleural thickening and scattered areas of groundglass opacity suggestive of sarcoid versus atypical infection.  She is COVID negative.  Current labs include WBC 9.7, hemoglobin 8, platelets 9k, creatinine 0.82, D-dimer 1.54.  Request now received from heme/onc for CT-guided bone marrow biopsy for further evaluation.  Past Medical History:  Diagnosis Date  . Acute hyponatremia 06/03/2017  . Anemia   . CAP (community acquired pneumonia) 06/03/2017  . Community acquired pneumonia 06/05/2017  . Facial dermatitis 06/03/2017  . HIV (human immunodeficiency virus infection) ([place])   . Pleurisy 06/03/2017  . Pneumonia 06/06/2017  . 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 ([place])   . Thrush of mouth and esophagus ([place])   . UTI (urinary tract infection)     Past Surgical History:  Procedure Laterality Date  . NO PAST SURGERIES      Allergies: Heparin  Medications: Prior to Admission medications   Medication Sig Start Date End Date Taking? Authorizing Provider  acetaminophen (TYLENOL) 325 MG tablet Take 650 mg by mouth every 6 (six) hours as needed for mild pain or headache.   Yes [provider]  bictegravir-emtricitabine-tenofovir AF (BIKTARVY) 50-200-25 MG TABS tablet Take 1 tablet by mouth daily. 06/20/18  Yes Golden Circle, FNP  dapsone 100 MG tablet Take 1 tablet (100 mg total) by mouth daily. 06/20/18  Yes Golden Circle, FNP  diphenhydrAMINE-PE-APAP (THERAFLU EXPRESSMAX) 12.5-5-325 MG/15ML LIQD Take 15 mLs by mouth as needed (flu like symptoms).   Yes [provider]  sodium-potassium bicarbonate (ALKA-SELTZER GOLD) TBEF dissolvable tablet Take 1 tablet by mouth daily as needed (headache/pain).   Yes [provider]  ondansetron (ZOFRAN) 4 MG tablet Take 1 tablet (4 mg total) by mouth every 6 (six) hours. Patient not taking: Reported on 07/04/2018 05/24/18   Raylene Everts, MD     Family History  Problem Relation Age of Onset  . Healthy Father   . Healthy Mother   . Asthma Paternal Grandmother     Social History   Socioeconomic History  . Marital status: Married    Spouse name: Not on file  . Number of children: Not on file  . Years of education: college  . Highest education level: Bachelor's degree (e.g., BA, AB, BS)  Occupational History  . Occupation: [employer]    Employer: [employer]  . Financial resource strain: Not hard at all  . Food insecurity:    Worry: Never true    Inability: Never true  . Transportation needs:    Medical: No    Non-medical: No  Tobacco Use  . Smoking status: Former Smoker    Packs/day: 0.25    Years: 8.00    Pack years: 2.00    Types: Cigarettes, Cigars    Last attempt to quit: 12/25/2016    Years since quitting: 1.5  . Smokeless tobacco: Never Used  Substance and Sexual Activity  .  Alcohol use: Yes    Comment: weekend/socially  . Drug use: No  . Sexual activity: Not Currently    Birth control/protection: Injection  Lifestyle  . Physical activity:    Days per week: 0 days    Minutes per session: 0 min  . Stress: Not at all  Relationships  . Social connections:    Talks on phone: Once a week    Gets together: Once a week    Attends religious service: More than 4 times per year    Active  member of club or organization: No    Attends meetings of clubs or organizations: Never    Relationship status: Married  Other Topics Concern  . Not on file  Social History Narrative   Patient does not drive, if her husband cannot provide transportation she takes uses Lyft services   She has not smoked since 2018      Review of Systems see above; denies headache, chest pain, abdominal pain, nausea, vomiting or bleeding  Vital Signs: BP 91/66   Pulse 93   Temp 97.7 F (36.5 C) (Oral)   Resp (!) 25   Ht 5' 1"  (1.549 m)   Wt 125 lb (56.7 kg)   LMP  (LMP Unknown) Comment: depo injection this week (06/20/18)  SpO2 93%   BMI 23.62 kg/m   Physical Exam awake, alert.  Chest with diminished breath sounds bilaterally.  Heart with regular rate and rhythm.  Abdomen soft, positive bowel sounds, nontender.  No lower extremity edema.  Imaging: Dg Chest 2 View  Result Date: 06/18/2018 CLINICAL DATA:  Dyspnea on exertion EXAM: CHEST - 2 VIEW COMPARISON:  05/28/2018 FINDINGS: Cardiac shadow is within normal limits. The reticulonodular densities seen on prior examination have nearly completely resolved. No sizable infiltrate or effusion is seen. No bony abnormality is noted. IMPRESSION: Near complete resolution of previously seen reticulonodular densities. Electronically Signed   By: Inez Catalina M.D.   On: 06/18/2018 15:47   Ct Chest W Contrast  Result Date: 07/04/2018 CLINICAL DATA:  Increased shortness of breath EXAM: CT CHEST WITH CONTRAST TECHNIQUE: Multidetector CT imaging of the chest was performed during intravenous contrast administration. CONTRAST:  28m OMNIPAQUE IOHEXOL 300 MG/ML  SOLN COMPARISON:  05/28/2018 FINDINGS: Cardiovascular: No significant vascular findings. Normal heart size. No pericardial effusion. Mediastinum/Nodes: Stable mediastinal and bilateral hilar lymphadenopathy measuring up to 17 mm in short axis along the right paratracheal location. Normal trachea. Normal  esophagus. Normal thyroid gland. Lungs/Pleura: Diffuse bilateral interstitial thickening and fissural thickening. Peripheral sub pleural thickening again noted bilaterally. Abnormality involves bilateral upper and lower lungs with the lung bases least affected. Trace left pleural effusion. No right pleural effusion. Upper Abdomen: No acute abnormality. Musculoskeletal: No chest wall abnormality. No acute or significant osseous findings. IMPRESSION: 1. Worsening diffuse bilateral interstitial and fissural thickening with sub pleural thickening and scattered areas of ground-glass opacity. Differential considerations include chronic interstitial lung disease such as sarcoidosis versus atypical infection given the patient's history of HIV. Electronically Signed   By: HKathreen Devoid  On: 07/04/2018 18:34   Dg Chest Portable 1 View  Result Date: 07/04/2018 CLINICAL DATA:  Worsening shortness of breath and cough EXAM: PORTABLE CHEST 1 VIEW COMPARISON:  06/18/2018 FINDINGS: There is bilateral diffuse reticular interstitial thickening. There is no focal consolidation. There is no pleural effusion or pneumothorax. The heart and mediastinal contours are unremarkable. The osseous structures are unremarkable. IMPRESSION: Bilateral diffuse reticular interstitial thickening which may reflect mild interstitial edema  versus pneumonia including atypical pneumonia. Electronically Signed   By: Kathreen Devoid   On: 07/04/2018 12:57    Labs:  CBC: Recent Labs    06/20/18 1516 07/04/18 1254 07/04/18 2358 07/05/18 0242  WBC 5.1 6.6 9.4 9.7  HGB 8.0* 5.1* 9.0* 8.0*  HCT 23.1* 16.8* 27.3* 24.8*  PLT 190 14* 11* 9*  9*    COAGS: Recent Labs    07/05/18 0242  INR 1.4*  APTT 35    BMP: Recent Labs    05/30/18 0253  06/13/18 1437 06/20/18 1516 07/04/18 1254 07/05/18 0242  NA 134*   < > 126* 135 128* 130*  K 3.7   < > 5.7* 4.6 4.1 4.3  CL 111   < > 98 102 94* 101  CO2 16*   < > 19* 24 23 21*  GLUCOSE 125*    < > 134* 75 116* 132*  BUN 9   < > 17 9 26* 14  CALCIUM 7.7*   < > 9.0 8.6 7.4* 7.1*  CREATININE 0.69   < > 1.06 0.75 1.13* 0.82  GFRNONAA >60  --   --  106 >60 >60  GFRAA >60  --   --  123 >60 >60   < > = values in this interval not displayed.    LIVER FUNCTION TESTS: Recent Labs    03/08/18 1722  05/28/18 1322 06/20/18 1516 07/04/18 1254 07/05/18 0242  BILITOT 0.6   < > 1.2 1.1 1.4* 0.8  AST 37   < > 44* 34* 39 32  ALT 20   < > 31 14 12 12   ALKPHOS 95  --  180*  --  153* 104  PROT 9.8*   < > 8.1 7.2 7.3 6.2*  ALBUMIN 3.5  --  2.4*  --  2.4* 2.0*   < > = values in this interval not displayed.    TUMOR MARKERS: No results for input(s): AFPTM, CEA, CA199, CHROMGRNA in the last 8760 hours.  Assessment and Plan: 33 y.o. female ex smoker  with past medical history significant for HIV/AIDS, pneumocystis pneumonia,?  sarcoidosis, anemia and thrombocytopenia.  She recently presented to [facility] with worsening dyspnea/cough, intermittent fevers.  Subsequent imaging revealed worsening diffuse bilateral interstitial and fissural thickening with subpleural thickening and scattered areas of groundglass opacity suggestive of sarcoid versus atypical infection.  She is COVID negative.  Current labs include WBC 9.7, hemoglobin 8, platelets 9k, creatinine 0.82, D-dimer 1.54.  BP remains soft.  Request now received from heme/onc for CT-guided bone marrow biopsy for further evaluation.Risks and benefits of procedure was discussed with the patient  including, but not limited to bleeding, infection, damage to adjacent structures or low yield requiring additional tests.  All of the questions were answered and there is agreement to proceed.  Consent signed and in chart.     Thank you for this interesting consult.  I greatly enjoyed meeting Krystal Short and look forward to participating in their care.  A copy of this report was sent to the requesting provider on this date.   Electronically Signed: D. Rowe Robert, PA-C 07/05/2018, 9:39 AM   I spent a total of  25 minutes   in face to face in clinical consultation, greater than 50% of which was counseling/coordinating care for CT-guided bone marrow biopsy

## 2018-07-05 NOTE — Progress Notes (Signed)
PROGRESS NOTE    Krystal Short  PFX:[id number] DOB: [birth year] DOA: 07/04/2018 PCP: [name]    Brief Narrative:   33 year old female with history of HIV/AIDS, chronic anemia and thrombocytopenia with recent admission for suspected PCP infection, latest CD4 count 35 presented to the hospital secondary to worsening dyspnea on exertion, cough and fever as high as 103 for about a week.  Multiple COVID-19 test negative. In the emergency room, afebrile, tachycardic and tachypneic, oxygen 86% on room air, hemoglobin 5.1, platelets 14, CT scan showed diffuse bilateral infiltrates suspected PCP.  Admitted with hematology and ID consultation.  Assessment & Plan:   Principal Problem:   Acute respiratory failure with hypoxia (HCC) Active Problems:   Tachycardia   AIDS (HCC)   Pneumocystis jiroveci pneumonia (HCC)   DOE (dyspnea on exertion)   Symptomatic anemia   Thrombocytopenia (HCC)  Acute hypoxic respiratory failure: Suspected secondary to recurrent PJP or other atypical pneumonias versus sarcoidosis.  Most likely PJP. Continue on oxygen to keep saturations more than 90%. Incentive spirometry deep breathing exercises and albuterol inhaler. Patient was given a dose of vancomycin, ceftriaxone and azithromycin in the ER, infectious disease suggested to treat with Bactrim and steroid. Cultures are pending.  Acute on chronic anemia, symptomatic anemia and thrombocytopenia: Suspect secondary to primary HIV disease.  No evidence of hemolysis. Received 2 units of PRBC transfusion with appropriate response.  We will continue to monitor. Platelets 9000, undergoing bone marrow biopsy today. Followed by hematology and appreciate their input.  Not recommended for transfusion at this time.  Sepsis with no endorgan dysfunction: Fairly stable.  Currently metallic stable.  Cultures are pending.  AIDS/HIV: Viral load undetectable.  CD4 count less than 35.  On Biktarvy and  followed by ID.  Acute kidney injury: Improved with IV fluid administration.  Called and discussed case with Dr. Pascal Lux from interventional radiology for bone marrow biopsy.  Keep n.p.o.  Suggested no need for platelet transfusion for bone marrow biopsy.  DVT prophylaxis: SCDs Code Status: Full code Family Communication: None Disposition Plan: Inpatient stepdown   Consultants:   Infectious disease  Hematology  Radiology  Procedures:   None  Antimicrobials:   Bactrim, 07/04/2018   Subjective: Patient seen and examined.  Afebrile overnight.  On 2 L oxygen.  86% on room air.  Has some dry cough.  Denies any nausea or vomiting.  Objective: Vitals:   07/05/18 0323 07/05/18 0700 07/05/18 0800 07/05/18 0850  BP:  (!) 86/64 91/66   Pulse:  91 90 93  Resp:  (!) 24 (!) 24 (!) 25  Temp: 97.9 F (36.6 C)  97.7 F (36.5 C)   TempSrc: Oral  Oral   SpO2:  96% 96% 93%  Weight:      Height:        Intake/Output Summary (Last 24 hours) at 07/05/2018 0919 Last data filed at 07/05/2018 0849 Gross per 24 hour  Intake 1244.83 ml  Output 300 ml  Net 944.83 ml   Filed Weights   07/04/18 1313  Weight: 56.7 kg    Examination:  General exam: Appears calm and comfortable, on 2 L oxygen. Respiratory system: Clear to auscultation. Respiratory effort normal. Cardiovascular system: S1 & S2 heard, RRR. No JVD, murmurs, rubs, gallops or clicks. No pedal edema. Gastrointestinal system: Abdomen is nondistended, soft and nontender. No organomegaly or masses felt. Normal bowel sounds heard. Central nervous system: Alert and oriented. No focal neurological deficits. Extremities: Symmetric 5 x 5 power. Skin:  No rashes, lesions or ulcers Psychiatry: Judgement and insight appear normal. Mood & affect appropriate.     Data Reviewed: I have personally reviewed following labs and imaging studies  CBC: Recent Labs  Lab 07/04/18 1254 07/04/18 2358 07/05/18 0242  WBC 6.6 9.4 9.7   NEUTROABS 4.1  --  5.5  HGB 5.1* 9.0* 8.0*  HCT 16.8* 27.3* 24.8*  MCV 107.0* 95.8 95.8  PLT 14* 11* 9*  9*   Basic Metabolic Panel: Recent Labs  Lab 07/04/18 1254 07/05/18 0242  NA 128* 130*  K 4.1 4.3  CL 94* 101  CO2 23 21*  GLUCOSE 116* 132*  BUN 26* 14  CREATININE 1.13* 0.82  CALCIUM 7.4* 7.1*   GFR: Estimated Creatinine Clearance: 75 mL/min (by C-G formula based on SCr of 0.82 mg/dL). Liver Function Tests: Recent Labs  Lab 07/04/18 1254 07/05/18 0242  AST 39 32  ALT 12 12  ALKPHOS 153* 104  BILITOT 1.4* 0.8  PROT 7.3 6.2*  ALBUMIN 2.4* 2.0*   No results for input(s): LIPASE, AMYLASE in the last 168 hours. No results for input(s): AMMONIA in the last 168 hours. Coagulation Profile: Recent Labs  Lab 07/05/18 0242  INR 1.4*   Cardiac Enzymes: No results for input(s): CKTOTAL, CKMB, CKMBINDEX, TROPONINI in the last 168 hours. BNP (last 3 results) No results for input(s): PROBNP in the last 8760 hours. HbA1C: No results for input(s): HGBA1C in the last 72 hours. CBG: No results for input(s): GLUCAP in the last 168 hours. Lipid Profile: No results for input(s): CHOL, HDL, LDLCALC, TRIG, CHOLHDL, LDLDIRECT in the last 72 hours. Thyroid Function Tests: No results for input(s): TSH, T4TOTAL, FREET4, T3FREE, THYROIDAB in the last 72 hours. Anemia Panel: Recent Labs    07/04/18 1415 07/04/18 1445 07/05/18 0242  VITAMINB12 725  --   --   FOLATE 13.7  --   --   FERRITIN 1,106*  --   --   TIBC 324  --   --   IRON 58  --   --   RETICCTPCT  --  2.0 1.4   Sepsis Labs: Recent Labs  Lab 07/04/18 1252 07/04/18 1433  LATICACIDVEN 2.6* 2.2*    Recent Results (from the past 240 hour(s))  SARS Coronavirus 2 (CEPHEID- Performed in [place] hospital lab), Hosp Order     Status: None   Collection Time: 07/04/18  1:50 PM  Result Value Ref Range Status   SARS Coronavirus 2 NEGATIVE NEGATIVE Final    Comment: (NOTE) If result is NEGATIVE SARS-CoV-2  target nucleic acids are NOT DETECTED. The SARS-CoV-2 RNA is generally detectable in upper and lower  respiratory specimens during the acute phase of infection. The lowest  concentration of SARS-CoV-2 viral copies this assay can detect is 250  copies / mL. A negative result does not preclude SARS-CoV-2 infection  and should not be used as the sole basis for treatment or other  patient management decisions.  A negative result may occur with  improper specimen collection / handling, submission of specimen other  than nasopharyngeal swab, presence of viral mutation(s) within the  areas targeted by this assay, and inadequate number of viral copies  (<250 copies / mL). A negative result must be combined with clinical  observations, patient history, and epidemiological information. If result is POSITIVE SARS-CoV-2 target nucleic acids are DETECTED. The SARS-CoV-2 RNA is generally detectable in upper and lower  respiratory specimens dur ing the acute phase of infection.  Positive  results  are indicative of active infection with SARS-CoV-2.  Clinical  correlation with patient history and other diagnostic information is  necessary to determine patient infection status.  Positive results do  not rule out bacterial infection or co-infection with other viruses. If result is PRESUMPTIVE POSTIVE SARS-CoV-2 nucleic acids MAY BE PRESENT.   A presumptive positive result was obtained on the submitted specimen  and confirmed on repeat testing.  While 2019 novel coronavirus  (SARS-CoV-2) nucleic acids may be present in the submitted sample  additional confirmatory testing may be necessary for epidemiological  and / or clinical management purposes  to differentiate between  SARS-CoV-2 and other Sarbecovirus currently known to infect humans.  If clinically indicated additional testing with an alternate test  methodology [id number]) is advised. The SARS-CoV-2 RNA is generally  detectable in upper and lower  respiratory sp ecimens during the acute  phase of infection. The expected result is Negative. Fact Sheet for Patients:  StrictlyIdeas.no Fact Sheet for Healthcare Providers: BankingDealers.co.za This test is not yet approved or cleared by the [place] FDA and has been authorized for detection and/or diagnosis of SARS-CoV-2 by FDA under an Emergency Use Authorization (EUA).  This EUA will remain in effect (meaning this test can be used) for the duration of the COVID-19 declaration under Section 564(b)(1) of the Act, 21 U.S.C. section 360bbb-3(b)(1), unless the authorization is terminated or revoked sooner. Performed at [facility], [place] [address] [place], [place] [address]   MRSA PCR Screening     Status: None   Collection Time: 07/04/18  4:21 PM  Result Value Ref Range Status   MRSA by PCR NEGATIVE NEGATIVE Final    Comment:        The GeneXpert MRSA Assay (FDA approved for NASAL specimens only), is one component of a comprehensive MRSA colonization surveillance program. It is not intended to diagnose MRSA infection nor to guide or monitor treatment for MRSA infections. Performed at [facility], [place] [address], [place], [place] [address]          Radiology Studies: Ct Chest W Contrast  Result Date: 07/04/2018 CLINICAL DATA:  Increased shortness of breath EXAM: CT CHEST WITH CONTRAST TECHNIQUE: Multidetector CT imaging of the chest was performed during intravenous contrast administration. CONTRAST:  12m OMNIPAQUE IOHEXOL 300 MG/ML  SOLN COMPARISON:  05/28/2018 FINDINGS: Cardiovascular: No significant vascular findings. Normal heart size. No pericardial effusion. Mediastinum/Nodes: Stable mediastinal and bilateral hilar lymphadenopathy measuring up to 17 mm in short axis along the right paratracheal location. Normal trachea. Normal esophagus. Normal thyroid gland. Lungs/Pleura: Diffuse  bilateral interstitial thickening and fissural thickening. Peripheral sub pleural thickening again noted bilaterally. Abnormality involves bilateral upper and lower lungs with the lung bases least affected. Trace left pleural effusion. No right pleural effusion. Upper Abdomen: No acute abnormality. Musculoskeletal: No chest wall abnormality. No acute or significant osseous findings. IMPRESSION: 1. Worsening diffuse bilateral interstitial and fissural thickening with sub pleural thickening and scattered areas of ground-glass opacity. Differential considerations include chronic interstitial lung disease such as sarcoidosis versus atypical infection given the patient's history of HIV. Electronically Signed   By: HKathreen Devoid  On: 07/04/2018 18:34   Dg Chest Portable 1 View  Result Date: 07/04/2018 CLINICAL DATA:  Worsening shortness of breath and cough EXAM: PORTABLE CHEST 1 VIEW COMPARISON:  06/18/2018 FINDINGS: There is bilateral diffuse reticular interstitial thickening. There is no focal consolidation. There is no pleural effusion or pneumothorax. The heart and mediastinal contours are unremarkable.  The osseous structures are unremarkable. IMPRESSION: Bilateral diffuse reticular interstitial thickening which may reflect mild interstitial edema versus pneumonia including atypical pneumonia. Electronically Signed   By: Kathreen Devoid   On: 07/04/2018 12:57        Scheduled Meds: . sodium chloride   Intravenous Once  . B-complex with vitamin C  1 tablet Oral Daily  . bictegravir-emtricitabine-tenofovir AF  1 tablet Oral Daily  . Chlorhexidine Gluconate Cloth  6 each Topical Q0600  . folic acid  2 mg Oral Daily  . mouth rinse  15 mL Mouth Rinse BID  . predniSONE  40 mg Oral Q breakfast  . sulfamethoxazole-trimethoprim  2 tablet Oral Q8H   Continuous Infusions: . sodium chloride 100 mL/hr at 07/05/18 0849     LOS: 1 day    Time spent: 25 minutes    Barb Merino, MD [facility]  Pager [phone]  If 7PM-7AM, please contact night-coverage [email or web address] Password [facility] 07/05/2018, 9:19 AM

## 2018-07-05 NOTE — Consult Note (Signed)
Date of Admission:  07/04/2018          Reason for Consult: Possible PCP pneumonia, anemia, TTpenia in patient with HIV disease   Referring Provider: Dr. Lonny Prude   Assessment:  1. PCP vs possibly sarcoid 2. HIV/AIDS--well controlled but still low CD4 3. Anemia 4. Thrombocytopenia   Plan:  1. Would treat for PCP with bactrim and prednisone (latter would also treat Sarcoid) 2. Agree with bone marrow biopsy--ALSO Send specimen if possible for AFB stain and culture 3. I am sending AFB blood cultures 4. Continue Biktarvy Principal Problem:   Acute respiratory failure with hypoxia (HCC) Active Problems:   Tachycardia   AIDS (HCC)   Pneumocystis jiroveci pneumonia (HCC)   DOE (dyspnea on exertion)   Symptomatic anemia   Thrombocytopenia (HCC)   Scheduled Meds: . sodium chloride   Intravenous Once  . B-complex with vitamin C  1 tablet Oral Daily  . bictegravir-emtricitabine-tenofovir AF  1 tablet Oral Daily  . Chlorhexidine Gluconate Cloth  6 each Topical Q0600  . folic acid  2 mg Oral Daily  . mouth rinse  15 mL Mouth Rinse BID  . predniSONE  40 mg Oral Q breakfast  . sulfamethoxazole-trimethoprim  2 tablet Oral Q8H   Continuous Infusions: . sodium chloride 100 mL/hr at 07/05/18 0849   PRN Meds:.HYDROcodone-acetaminophen, HYDROcodone-homatropine, ondansetron **OR** ondansetron (ZOFRAN) IV y HPI: Krystal Short is a 33 y.o. female with HIV/AIDS diagnosed in April 2019 who rapidly suppressed her HIV virus with Biktarvy but who has had not been able to fully reconstitute her immune system with CD4 having been at 10, rising to 70. She has recently been admitted to the hospital and treated empirically for PCP pneumonia. She had been on bactrim prophylacticall though thought PCP can still occur. Questions were raised re possible sarcoid which of course would also be treated effectively with steroids  She completed her bactrim and steroid course which was complicated by  hyperkalemia (it sounds as if she was taking too high a dose, higher than rx bactrim) then transitioned to dapsone.  She has maintained high adherence to her ARV and dapsone.  She has not been on M avium prophylaxis given modern guidelines that do not push this given that most ppl recontitute CD4 fairly rapidly worsening dyspnea, cough, sputum producgton, fever to 103. She came to ER where COVID 19 negative. CT scan shows diffuse infiltrated thouth c/w PCP vs sarcoid  She had profound anemia and TTPenia on admission along with macrocytic anemia.   She has received PRBC. She had prior Ttpenia but not to this extent. Dr. Irene Limbo has seen her and do not believe she has TTP. The has been some concern for dapsone induced hemolysis. For what it is worth she did have norma G6pd level  My own thoughts is that M avium could be a cause or sarcoid that was suspected previously based on chest imaging, vs malignancy.  She has been supergbly adherent to her ARV  I agree that we need a bone marrow biopsy to get a diagnosis re her TTPnia and anemia.  If possible would send a specimen also  For AFB culture  My partner Dr. Johnnye Sima is covering over the weekend and will check in on her tomorrow.  She presents with    Review of Systems: Review of Systems  Constitutional: Positive for fever and malaise/fatigue. Negative for chills, diaphoresis and weight loss.  HENT: Negative for congestion, hearing loss, sore throat and tinnitus.  Eyes: Negative for blurred vision and double vision.  Respiratory: Positive for cough, sputum production and shortness of breath. Negative for wheezing.   Cardiovascular: Positive for palpitations. Negative for chest pain and leg swelling.  Gastrointestinal: Negative for abdominal pain, blood in stool, constipation, diarrhea, heartburn, melena, nausea and vomiting.  Genitourinary: Negative for dysuria, flank pain and hematuria.  Musculoskeletal: Negative for back pain, falls,  joint pain and myalgias.  Skin: Negative for itching and rash.  Neurological: Negative for dizziness, sensory change, focal weakness, loss of consciousness, weakness and headaches.  Endo/Heme/Allergies: Does not bruise/bleed easily.  Psychiatric/Behavioral: Negative for depression, memory loss and suicidal ideas. The patient is not nervous/anxious.     Past Medical History:  Diagnosis Date  . Acute hyponatremia 06/03/2017  . Anemia   . CAP (community acquired pneumonia) 06/03/2017  . Community acquired pneumonia 06/05/2017  . Facial dermatitis 06/03/2017  . HIV (human immunodeficiency virus infection) ([place])   . Pleurisy 06/03/2017  . Pneumonia 06/06/2017  . 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 ([place])   . Thrush of mouth and esophagus ([place])   . UTI (urinary tract infection)     Social History   Tobacco Use  . Smoking status: Former Smoker    Packs/day: 0.25    Years: 8.00    Pack years: 2.00    Types: Cigarettes, Cigars    Last attempt to quit: 12/25/2016    Years since quitting: 1.5  . Smokeless tobacco: Never Used  Substance Use Topics  . Alcohol use: Yes    Comment: weekend/socially  . Drug use: No    Family History  Problem Relation Age of Onset  . Healthy Father   . Healthy Mother   . Asthma Paternal Grandmother    Allergies  Allergen Reactions  . Heparin     OBJECTIVE: Blood pressure 91/66, pulse 93, temperature 97.7 F (36.5 C), temperature source Oral, resp. rate (!) 25, height _0  (1.549 m), weight 56.7 kg, SpO2 93 %.  Physical Exam Constitutional:      General: She is not in acute distress.    Appearance: She is not diaphoretic.  HENT:     Head: Normocephalic and atraumatic.     Right Ear: External ear normal.     Left Ear: External ear normal.     Nose: Nose normal.     Mouth/Throat:     Pharynx: No oropharyngeal exudate.  Eyes:     General: No scleral icterus.    Conjunctiva/sclera: Conjunctivae normal.     Pupils: Pupils are  equal, round, and reactive to light.  Neck:     Musculoskeletal: Normal range of motion and neck supple.  Cardiovascular:     Rate and Rhythm: Normal rate and regular rhythm.     Heart sounds: Normal heart sounds. No murmur. No friction rub. No gallop.   Pulmonary:     Effort: Prolonged expiration present. No tachypnea, accessory muscle usage or respiratory distress.     Breath sounds: Normal breath sounds. No stridor. No wheezing or rales.  Abdominal:     General: Bowel sounds are normal. There is no distension.     Palpations: Abdomen is soft.     Tenderness: There is no abdominal tenderness. There is no rebound.  Musculoskeletal: Normal range of motion.        General: No tenderness.  Lymphadenopathy:     Cervical: No cervical adenopathy.  Skin:    General: Skin is warm and dry.  Coloration: Skin is not pale.     Findings: No erythema or rash.  Neurological:     Mental Status: She is alert and oriented to person, place, and time.     Coordination: Coordination normal.  Psychiatric:        Judgment: Judgment normal.     Lab Results Lab Results  Component Value Date   WBC 9.7 07/05/2018   HGB 8.0 (L) 07/05/2018   HCT 24.8 (L) 07/05/2018   MCV 95.8 07/05/2018   PLT 9 (LL) 07/05/2018   PLT 9 (LL) 07/05/2018    Lab Results  Component Value Date   CREATININE 0.82 07/05/2018   BUN 14 07/05/2018   NA 130 (L) 07/05/2018   K 4.3 07/05/2018   CL 101 07/05/2018   CO2 21 (L) 07/05/2018    Lab Results  Component Value Date   ALT 12 07/05/2018   AST 32 07/05/2018   ALKPHOS 104 07/05/2018   BILITOT 0.8 07/05/2018     Microbiology: Recent Results (from the past 240 hour(s))  SARS Coronavirus 2 (CEPHEID- Performed in [place] hospital lab), Hosp Order     Status: None   Collection Time: 07/04/18  1:50 PM  Result Value Ref Range Status   SARS Coronavirus 2 NEGATIVE NEGATIVE Final    Comment: (NOTE) If result is NEGATIVE SARS-CoV-2 target nucleic acids are NOT  DETECTED. The SARS-CoV-2 RNA is generally detectable in upper and lower  respiratory specimens during the acute phase of infection. The lowest  concentration of SARS-CoV-2 viral copies this assay can detect is 250  copies / mL. A negative result does not preclude SARS-CoV-2 infection  and should not be used as the sole basis for treatment or other  patient management decisions.  A negative result may occur with  improper specimen collection / handling, submission of specimen other  than nasopharyngeal swab, presence of viral mutation(s) within the  areas targeted by this assay, and inadequate number of viral copies  (<250 copies / mL). A negative result must be combined with clinical  observations, patient history, and epidemiological information. If result is POSITIVE SARS-CoV-2 target nucleic acids are DETECTED. The SARS-CoV-2 RNA is generally detectable in upper and lower  respiratory specimens dur ing the acute phase of infection.  Positive  results are indicative of active infection with SARS-CoV-2.  Clinical  correlation with patient history and other diagnostic information is  necessary to determine patient infection status.  Positive results do  not rule out bacterial infection or co-infection with other viruses. If result is PRESUMPTIVE POSTIVE SARS-CoV-2 nucleic acids MAY BE PRESENT.   A presumptive positive result was obtained on the submitted specimen  and confirmed on repeat testing.  While 2019 novel coronavirus  (SARS-CoV-2) nucleic acids may be present in the submitted sample  additional confirmatory testing may be necessary for epidemiological  and / or clinical management purposes  to differentiate between  SARS-CoV-2 and other Sarbecovirus currently known to infect humans.  If clinically indicated additional testing with an alternate test  methodology [phone]) is advised. The SARS-CoV-2 RNA is generally  detectable in upper and lower respiratory sp ecimens during  the acute  phase of infection. The expected result is Negative. Fact Sheet for Patients:  StrictlyIdeas.no Fact Sheet for Healthcare Providers: BankingDealers.co.za This test is not yet approved or cleared by the [place] FDA and has been authorized for detection and/or diagnosis of SARS-CoV-2 by FDA under an Emergency Use Authorization (EUA).  This EUA will remain in  effect (meaning this test can be used) for the duration of the COVID-19 declaration under Section 564(b)(1) of the Act, 21 U.S.C. section 360bbb-3(b)(1), unless the authorization is terminated or revoked sooner. Performed at [facility], [place] [address], [place], [place] [address]   MRSA PCR Screening     Status: None   Collection Time: 07/04/18  4:21 PM  Result Value Ref Range Status   MRSA by PCR NEGATIVE NEGATIVE Final    Comment:        The GeneXpert MRSA Assay (FDA approved for NASAL specimens only), is one component of a comprehensive MRSA colonization surveillance program. It is not intended to diagnose MRSA infection nor to guide or monitor treatment for MRSA infections. Performed at [facility], [place] [address] [place], [place] [address]     Alcide Evener, [facility] [phone] pager  07/05/2018, 10:12 AM

## 2018-07-05 NOTE — Progress Notes (Addendum)
HEMATOLOGY-ONCOLOGY PROGRESS NOTE  SUBJECTIVE: The patient just returned from her bone marrow biopsy.  She overall tolerated this well.  States that she is tired this morning.  Denies bleeding.  She has no other complaints this morning.  REVIEW OF SYSTEMS:   Constitutional: No fever since admission. Eyes: Denies blurriness of vision Ears, nose, mouth, throat, and face: Denies mucositis or sore throat Respiratory: Still has cough and shortness of breath. Cardiovascular: Denies palpitations, chest discomfort Gastrointestinal:  Denies nausea, heartburn or change in bowel habits Skin: Denies abnormal skin rashes Lymphatics: Denies new lymphadenopathy or easy bruising Neurological:Denies numbness, tingling or new weaknesses Behavioral/Psych: Mood is stable, no new changes  Extremities: No lower extremity edema All other systems were reviewed with the patient and are negative.  I have reviewed the past medical history, past surgical history, social history and family history with the patient and they are unchanged from previous note.   PHYSICAL EXAMINATION:  Vitals:   07/05/18 0901 07/05/18 1025  BP:  (!) 89/57  Pulse: 97 92  Resp: 17 (!) 28  Temp:    SpO2: 91% 97%   Filed Weights   07/04/18 1313  Weight: 125 lb (56.7 kg)    Intake/Output from previous day: 05/28 0701 - 05/29 0700 In: 1244.8 [P.O.:120; Blood:574.8; IV Piggyback:550] Out: 300 [Urine:300]  GENERAL:alert, no distress and comfortable SKIN: skin color, texture, turgor are normal, no rashes or significant lesions EYES: normal, Conjunctiva are pink and non-injected, sclera clear OROPHARYNX:no exudate, no erythema and lips, buccal mucosa, and tongue normal  NECK: supple, thyroid normal size, non-tender, without nodularity LYMPH:  no palpable lymphadenopathy in the cervical, axillary or inguinal LUNGS: clear to auscultation and percussion with normal breathing effort HEART: regular rate & rhythm and no murmurs and no  lower extremity edema ABDOMEN:abdomen soft, non-tender and normal bowel sounds Musculoskeletal:no cyanosis of digits and no clubbing  NEURO: alert & oriented x 3 with fluent speech, no focal motor/sensory deficits  LABORATORY DATA:  I have reviewed the data as listed CMP Latest Ref Rng & Units 07/05/2018 07/04/2018 06/20/2018  Glucose 70 - 99 mg/dL 132(H) 116(H) 75  BUN 6 - 20 mg/dL 14 26(H) 9  Creatinine 0.44 - 1.00 mg/dL 0.82 1.13(H) 0.75  Sodium 135 - 145 mmol/L 130(L) 128(L) 135  Potassium 3.5 - 5.1 mmol/L 4.3 4.1 4.6  Chloride 98 - 111 mmol/L 101 94(L) 102  CO2 22 - 32 mmol/L 21(L) 23 24  Calcium 8.9 - 10.3 mg/dL 7.1(L) 7.4(L) 8.6  Total Protein 6.5 - 8.1 g/dL 6.2(L) 7.3 7.2  Total Bilirubin 0.3 - 1.2 mg/dL 0.8 1.4(H) 1.1  Alkaline Phos 38 - 126 U/L 104 153(H) -  AST 15 - 41 U/L 32 39 34(H)  ALT 0 - 44 U/L 12 12 14     Lab Results  Component Value Date   WBC 9.7 07/05/2018   HGB 8.0 (L) 07/05/2018   HCT 24.8 (L) 07/05/2018   MCV 95.8 07/05/2018   PLT 9 (LL) 07/05/2018   PLT 9 (LL) 07/05/2018   NEUTROABS 5.5 07/05/2018    Dg Chest 2 View  Result Date: 06/18/2018 CLINICAL DATA:  Dyspnea on exertion EXAM: CHEST - 2 VIEW COMPARISON:  05/28/2018 FINDINGS: Cardiac shadow is within normal limits. The reticulonodular densities seen on prior examination have nearly completely resolved. No sizable infiltrate or effusion is seen. No bony abnormality is noted. IMPRESSION: Near complete resolution of previously seen reticulonodular densities. Electronically Signed   By: Inez Catalina M.D.   On:  06/18/2018 15:47   Ct Chest W Contrast  Result Date: 07/04/2018 CLINICAL DATA:  Increased shortness of breath EXAM: CT CHEST WITH CONTRAST TECHNIQUE: Multidetector CT imaging of the chest was performed during intravenous contrast administration. CONTRAST:  62m OMNIPAQUE IOHEXOL 300 MG/ML  SOLN COMPARISON:  05/28/2018 FINDINGS: Cardiovascular: No significant vascular findings. Normal heart size. No  pericardial effusion. Mediastinum/Nodes: Stable mediastinal and bilateral hilar lymphadenopathy measuring up to 17 mm in short axis along the right paratracheal location. Normal trachea. Normal esophagus. Normal thyroid gland. Lungs/Pleura: Diffuse bilateral interstitial thickening and fissural thickening. Peripheral sub pleural thickening again noted bilaterally. Abnormality involves bilateral upper and lower lungs with the lung bases least affected. Trace left pleural effusion. No right pleural effusion. Upper Abdomen: No acute abnormality. Musculoskeletal: No chest wall abnormality. No acute or significant osseous findings. IMPRESSION: 1. Worsening diffuse bilateral interstitial and fissural thickening with sub pleural thickening and scattered areas of ground-glass opacity. Differential considerations include chronic interstitial lung disease such as sarcoidosis versus atypical infection given the patient's history of HIV. Electronically Signed   By: HKathreen Devoid  On: 07/04/2018 18:34   Dg Chest Portable 1 View  Result Date: 07/04/2018 CLINICAL DATA:  Worsening shortness of breath and cough EXAM: PORTABLE CHEST 1 VIEW COMPARISON:  06/18/2018 FINDINGS: There is bilateral diffuse reticular interstitial thickening. There is no focal consolidation. There is no pleural effusion or pneumothorax. The heart and mediastinal contours are unremarkable. The osseous structures are unremarkable. IMPRESSION: Bilateral diffuse reticular interstitial thickening which may reflect mild interstitial edema versus pneumonia including atypical pneumonia. Electronically Signed   By: HKathreen Devoid  On: 07/04/2018 12:57    ASSESSMENT AND PLAN: 1. Anemia  2. Thrombocytopenia 3. PCP Pneumonia 4. Sepsis secondary to #3 5. ? Sarcoidosis 6. AKI 7. HIV/AIDS  -Anemia thrombocytopenia are likely multifactorial.  She has no evidence of TTP/MAHA.   Await results of bone marrow biopsy. -Continue to hold dapsone. -Recommend  holding Bactrim if not strongly indicated as this can cause anemia and thrombocytopenia. ID feels this is currently need for significant suspicion of PCP pneumonia.  -Continue B complex vitamin and folic acid to support hematopoiesis in the setting of bactrim use and some COombs neg hemolysis from Dapsone -Transfuse packed red blood cells for hemoglobin less than 7.0 or active bleeding.  No transfusion is indicated today. -Transfuse platelets for platelet count less than 20,000 if active bleeding or in the setting of sepsis.  Platelet count is 9000 today.  Will transfuse 1 unit of platelets today. -Please do 1 hour post count after each use of platelets.    LOS: 1 day   [place] AGPCNP-BC, AOCNP 07/05/18   ADDENDUM  Patient was Personally and independently interviewed, examined and relevant elements of the history of present illness were reviewed in details and an assessment and plan was created. All elements of the patient's history of present illness , assessment and plan were discussed in details with KMikey BussingDNP. The above documentation reflects our combined findings assessment and plan.  GSullivan LoneMD MS

## 2018-07-06 DIAGNOSIS — J9601 Acute respiratory failure with hypoxia: Secondary | ICD-10-CM

## 2018-07-06 DIAGNOSIS — B59 Pneumocystosis: Secondary | ICD-10-CM

## 2018-07-06 LAB — ACID FAST SMEAR (AFB, MYCOBACTERIA): Acid Fast Smear: NEGATIVE

## 2018-07-06 NOTE — Progress Notes (Signed)
HEMATOLOGY-ONCOLOGY PROGRESS NOTE  SUBJECTIVE:   Patient notes her breathing is better. Mild pain at the site of the BM Bx site. No fevers. No other acute new symptoms. PLT improved. hgb stable.  REVIEW OF SYSTEMS:   Constitutional: No fever since admission. Eyes: Denies blurriness of vision Ears, nose, mouth, throat, and face: Denies mucositis or sore throat Respiratory: Still has cough and shortness of breath. Cardiovascular: Denies palpitations, chest discomfort Gastrointestinal:  Denies nausea, heartburn or change in bowel habits Skin: Denies abnormal skin rashes Lymphatics: Denies new lymphadenopathy or easy bruising Neurological:Denies numbness, tingling or new weaknesses Behavioral/Psych: Mood is stable, no new changes  Extremities: No lower extremity edema All other systems were reviewed with the patient and are negative.  I have reviewed the past medical history, past surgical history, social history and family history with the patient and they are unchanged from previous note.   PHYSICAL EXAMINATION:  Vitals:   07/06/18 1355 07/06/18 1958  BP: 94/68 98/78  Pulse: 92 88  Resp: 18 16  Temp: 97.6 F (36.4 C) 98.2 F (36.8 C)  SpO2: 99% (!) 87%   Filed Weights   07/04/18 1313 07/06/18 1100  Weight: 125 lb (56.7 kg) 134 lb 7.7 oz (61 kg)   . GENERAL:alert, in no acute distress and comfortable SKIN: no acute rashes, no significant lesions EYES: perl OROPHARYNX: MMM, no exudates, no oropharyngeal erythema or ulceration NECK: supple, no JVD LYMPH:  no palpable lymphadenopathy in the cervical, axillary or inguinal regions LUNGS: few basal rales on rhonci HEART: regular rate & rhythm ABDOMEN:  normoactive bowel sounds , non tender, not distended. Extremity: no pedal edema PSYCH: alert & oriented x 3 with fluent speech NEURO: no focal motor/sensory deficits   LABORATORY DATA:  I have reviewed the data as listed CMP Latest Ref Rng & Units 07/05/2018 07/04/2018  06/20/2018  Glucose 70 - 99 mg/dL 132(H) 116(H) 75  BUN 6 - 20 mg/dL 14 26(H) 9  Creatinine 0.44 - 1.00 mg/dL 0.82 1.13(H) 0.75  Sodium 135 - 145 mmol/L 130(L) 128(L) 135  Potassium 3.5 - 5.1 mmol/L 4.3 4.1 4.6  Chloride 98 - 111 mmol/L 101 94(L) 102  CO2 22 - 32 mmol/L 21(L) 23 24  Calcium 8.9 - 10.3 mg/dL 7.1(L) 7.4(L) 8.6  Total Protein 6.5 - 8.1 g/dL 6.2(L) 7.3 7.2  Total Bilirubin 0.3 - 1.2 mg/dL 0.8 1.4(H) 1.1  Alkaline Phos 38 - 126 U/L 104 153(H) -  AST 15 - 41 U/L 32 39 34(H)  ALT 0 - 44 U/L _0 . CBC    Component Value Date/Time   WBC 9.3 07/05/2018 1621   RBC 2.60 (L) 07/05/2018 1621   HGB 8.0 (L) 07/05/2018 1621   HCT 25.6 (L) 07/05/2018 1621   PLT 39 (L) 07/05/2018 1621   MCV 98.5 07/05/2018 1621   MCH 30.8 07/05/2018 1621   MCHC 31.3 07/05/2018 1621   RDW 21.6 (H) 07/05/2018 1621   LYMPHSABS 1.3 07/05/2018 1621   MONOABS 2.2 (H) 07/05/2018 1621   EOSABS 0.0 07/05/2018 1621   BASOSABS 0.0 07/05/2018 1621     Dg Chest 2 View  Result Date: 06/18/2018 CLINICAL DATA:  Dyspnea on exertion EXAM: CHEST - 2 VIEW COMPARISON:  05/28/2018 FINDINGS: Cardiac shadow is within normal limits. The reticulonodular densities seen on prior examination have nearly completely resolved. No sizable infiltrate or effusion is seen. No bony abnormality is noted. IMPRESSION: Near complete resolution of previously seen reticulonodular densities. Electronically Signed  By: Inez Catalina M.D.   On: 06/18/2018 15:47   Ct Chest W Contrast  Result Date: 07/04/2018 CLINICAL DATA:  Increased shortness of breath EXAM: CT CHEST WITH CONTRAST TECHNIQUE: Multidetector CT imaging of the chest was performed during intravenous contrast administration. CONTRAST:  79m OMNIPAQUE IOHEXOL 300 MG/ML  SOLN COMPARISON:  05/28/2018 FINDINGS: Cardiovascular: No significant vascular findings. Normal heart size. No pericardial effusion. Mediastinum/Nodes: Stable mediastinal and bilateral hilar lymphadenopathy  measuring up to 17 mm in short axis along the right paratracheal location. Normal trachea. Normal esophagus. Normal thyroid gland. Lungs/Pleura: Diffuse bilateral interstitial thickening and fissural thickening. Peripheral sub pleural thickening again noted bilaterally. Abnormality involves bilateral upper and lower lungs with the lung bases least affected. Trace left pleural effusion. No right pleural effusion. Upper Abdomen: No acute abnormality. Musculoskeletal: No chest wall abnormality. No acute or significant osseous findings. IMPRESSION: 1. Worsening diffuse bilateral interstitial and fissural thickening with sub pleural thickening and scattered areas of ground-glass opacity. Differential considerations include chronic interstitial lung disease such as sarcoidosis versus atypical infection given the patient's history of HIV. Electronically Signed   By: HKathreen Devoid  On: 07/04/2018 18:34   Ct Biopsy  Result Date: 07/05/2018 INDICATION: History of HIV/AIDS, now with anemia and thrombocytopenia. Please perform CT-guided bone marrow biopsy for tissue diagnostic purposes. Please obtain a sample for additional culture analysis. EXAM: CT-GUIDED BONE MARROW BIOPSY AND ASPIRATION MEDICATIONS: None ANESTHESIA/SEDATION: Versed 2 mg IV Sedation Time: 10 Minutes; The patient was continuously monitored during the procedure by the interventional radiology nurse under my direct supervision. COMPLICATIONS: None immediate. PROCEDURE: Informed consent was obtained from the patient following an explanation of the procedure, risks, benefits and alternatives. The patient understands, agrees and consents for the procedure. All questions were addressed. A time out was performed prior to the initiation of the procedure. The patient was positioned prone and non-contrast localization CT was performed of the pelvis to demonstrate the iliac marrow spaces. The operative site was prepped and draped in the usual sterile fashion. Under  sterile conditions and local anesthesia, a 22 gauge spinal needle was utilized for procedural planning. Next, an 11 gauge coaxial bone biopsy needle was advanced into the left iliac marrow space. Needle position was confirmed with CT imaging. Initially, bone marrow aspiration was performed. Next, a bone marrow biopsy was obtained with the 11 gauge outer bone marrow device. Samples were prepared with the cytotechnologist and deemed adequate. The needle was removed intact. Hemostasis was obtained with compression and a dressing was placed. The patient tolerated the procedure well without immediate post procedural complication. IMPRESSION: Successful CT guided left iliac bone marrow aspiration and core biopsy. Note, a portion of the core biopsy with set aside for culture analysis. Electronically Signed   By: JSandi MariscalM.D.   On: 07/05/2018 11:43   Dg Chest Portable 1 View  Result Date: 07/04/2018 CLINICAL DATA:  Worsening shortness of breath and cough EXAM: PORTABLE CHEST 1 VIEW COMPARISON:  06/18/2018 FINDINGS: There is bilateral diffuse reticular interstitial thickening. There is no focal consolidation. There is no pleural effusion or pneumothorax. The heart and mediastinal contours are unremarkable. The osseous structures are unremarkable. IMPRESSION: Bilateral diffuse reticular interstitial thickening which may reflect mild interstitial edema versus pneumonia including atypical pneumonia. Electronically Signed   By: HKathreen Devoid  On: 07/04/2018 12:57   Ct Bone Marrow Biopsy & Aspiration  Result Date: 07/05/2018 INDICATION: History of HIV/AIDS, now with anemia and thrombocytopenia. Please perform CT-guided bone marrow biopsy  for tissue diagnostic purposes. Please obtain a sample for additional culture analysis. EXAM: CT-GUIDED BONE MARROW BIOPSY AND ASPIRATION MEDICATIONS: None ANESTHESIA/SEDATION: Versed 2 mg IV Sedation Time: 10 Minutes; The patient was continuously monitored during the procedure by the  interventional radiology nurse under my direct supervision. COMPLICATIONS: None immediate. PROCEDURE: Informed consent was obtained from the patient following an explanation of the procedure, risks, benefits and alternatives. The patient understands, agrees and consents for the procedure. All questions were addressed. A time out was performed prior to the initiation of the procedure. The patient was positioned prone and non-contrast localization CT was performed of the pelvis to demonstrate the iliac marrow spaces. The operative site was prepped and draped in the usual sterile fashion. Under sterile conditions and local anesthesia, a 22 gauge spinal needle was utilized for procedural planning. Next, an 11 gauge coaxial bone biopsy needle was advanced into the left iliac marrow space. Needle position was confirmed with CT imaging. Initially, bone marrow aspiration was performed. Next, a bone marrow biopsy was obtained with the 11 gauge outer bone marrow device. Samples were prepared with the cytotechnologist and deemed adequate. The needle was removed intact. Hemostasis was obtained with compression and a dressing was placed. The patient tolerated the procedure well without immediate post procedural complication. IMPRESSION: Successful CT guided left iliac bone marrow aspiration and core biopsy. Note, a portion of the core biopsy with set aside for culture analysis. Electronically Signed   By: Sandi Mariscal M.D.   On: 07/05/2018 11:43    ASSESSMENT AND PLAN: 1. Anemia - stable post transfusion. No overt intravascular hemolysis currently. No TTP. Could be from batrim, HIV r/o other etiologies in light of mediastinal LNadenoparthy 2. Thrombocytopenia 3. PCP , COVid19 neg 4. Sepsis secondary to #3 5. ? Sarcoidosis 6. AKI 7. HIV/AIDS PLAN -Anemia thrombocytopenia are likely multifactorial.  She has no evidence of TTP/MAHA.   Await results of bone marrow biopsy. -will need to monitor counts with Bactrim -plt  better on folic acid and steroids -Continue B complex vitamin and folic acid to support hematopoiesi. -Transfuse packed red blood cells for hemoglobin less than 7.0 or active bleeding.  No transfusion is indicated today. -Transfuse platelets for platelet count less than 20,000 if active bleeding or in the setting of sepsis.  Platelet count is 29000 today- no transfusion indicated -Please do 1 hour post count after each use of platelets.  Sullivan Lone MD MS

## 2018-07-06 NOTE — Progress Notes (Signed)
PROGRESS NOTE    Krystal Short  WLS:[id number] DOB: 05-15-1985 DOA: 07/04/2018 PCP: [name]    Brief Narrative:   33 year old female with history of HIV/AIDS, chronic anemia and thrombocytopenia with recent admission for suspected PCP infection, latest CD4 count 35 presented to the hospital secondary to worsening dyspnea on exertion, cough and fever as high as 103 for about a week.  Multiple COVID-19 test negative. In the emergency room, afebrile, tachycardic and tachypneic, oxygen 86% on room air, hemoglobin 5.1, platelets 14, CT scan showed diffuse bilateral infiltrates suspected PCP.  Admitted with hematology and ID consultation.  Assessment & Plan:   Principal Problem:   Acute respiratory failure with hypoxia (HCC) Active Problems:   Tachycardia   AIDS (HCC)   Pneumocystis jiroveci pneumonia (HCC)   DOE (dyspnea on exertion)   Symptomatic anemia   Thrombocytopenia (HCC)  Acute hypoxic respiratory failure: Suspected secondary to recurrent PJP or other atypical pneumonias versus sarcoidosis.  Most likely PJP. Continue on oxygen to keep saturations more than 90%. Incentive spirometry deep breathing exercises and albuterol inhaler. Patient was given a dose of vancomycin, ceftriaxone and azithromycin in the ER, infectious disease suggested to treat with Bactrim and steroid. Cultures are pending.  Acute on chronic anemia, symptomatic anemia and thrombocytopenia: Under investigation. Suspect secondary to primary HIV disease.  No evidence of hemolysis. Received 2 units of PRBC transfusion with appropriate response.  We will continue to monitor. Platelets 9000 and received 1 units of platelets , 39 today. Followed by hematology and appreciate their input.  On multivitamins.  Sepsis with no endorgan dysfunction: Fairly stable.  Currently metallic stable.  Cultures are pending.  AIDS/HIV: Viral load undetectable.  CD4 count less than 35.  On Biktarvy and  followed by ID.  Acute kidney injury: Improved with IV fluid administration.  Discontinue IV fluids.  Discontinue telemetry.  Patient can be transferred to general bed. DVT prophylaxis: SCDs Code Status: Full code Family Communication: None Disposition Plan: MedSurg bed.  Continues to need hospitalization.   Consultants:   Infectious disease  Hematology  Radiology  Procedures:   None  Antimicrobials:   Bactrim, 07/04/2018   Subjective: Patient seen and examined.  Afebrile overnight.  On 2 L oxygen.  A small amount of mucoid sputum.  Denies any nausea or vomiting.  Objective: Vitals:   07/06/18 0500 07/06/18 0600 07/06/18 0700 07/06/18 0805  BP: 113/89 (!) 101/56 (!) 95/37   Pulse: 92 91 95   Resp: 19 18 (!) 21   Temp:    97.7 F (36.5 C)  TempSrc:    Oral  SpO2: 96% 94% 95%   Weight:      Height:        Intake/Output Summary (Last 24 hours) at 07/06/2018 0833 Last data filed at 07/06/2018 0700 Gross per 24 hour  Intake 3237.72 ml  Output --  Net 3237.72 ml   Filed Weights   07/04/18 1313  Weight: 56.7 kg    Examination:  General exam: Appears calm and comfortable, on 2 L oxygen. Respiratory system: Clear to auscultation. Respiratory effort normal. Cardiovascular system: S1 & S2 heard, RRR. No JVD, murmurs, rubs, gallops or clicks. No pedal edema. Gastrointestinal system: Abdomen is nondistended, soft and nontender. No organomegaly or masses felt. Normal bowel sounds heard. Central nervous system: Alert and oriented. No focal neurological deficits. Extremities: Symmetric 5 x 5 power. Skin: No rashes, lesions or ulcers Psychiatry: Judgement and insight appear normal. Mood & affect appropriate.  Data Reviewed: I have personally reviewed following labs and imaging studies  CBC: Recent Labs  Lab 07/04/18 1254 07/04/18 2358 07/05/18 0242 07/05/18 1621  WBC 6.6 9.4 9.7 9.3  NEUTROABS 4.1  --  5.5 5.2  HGB 5.1* 9.0* 8.0* 8.0*  HCT 16.8* 27.3*  24.8* 25.6*  MCV 107.0* 95.8 95.8 98.5  PLT 14* 11* 9*   9* 39*   Basic Metabolic Panel: Recent Labs  Lab 07/04/18 1254 07/05/18 0242  NA 128* 130*  K 4.1 4.3  CL 94* 101  CO2 23 21*  GLUCOSE 116* 132*  BUN 26* 14  CREATININE 1.13* 0.82  CALCIUM 7.4* 7.1*   GFR: Estimated Creatinine Clearance: 75 mL/min (by C-G formula based on SCr of 0.82 mg/dL). Liver Function Tests: Recent Labs  Lab 07/04/18 1254 07/05/18 0242  AST 39 32  ALT 12 12  ALKPHOS 153* 104  BILITOT 1.4* 0.8  PROT 7.3 6.2*  ALBUMIN 2.4* 2.0*   No results for input(s): LIPASE, AMYLASE in the last 168 hours. No results for input(s): AMMONIA in the last 168 hours. Coagulation Profile: Recent Labs  Lab 07/05/18 0242  INR 1.4*   Cardiac Enzymes: No results for input(s): CKTOTAL, CKMB, CKMBINDEX, TROPONINI in the last 168 hours. BNP (last 3 results) No results for input(s): PROBNP in the last 8760 hours. HbA1C: No results for input(s): HGBA1C in the last 72 hours. CBG: No results for input(s): GLUCAP in the last 168 hours. Lipid Profile: No results for input(s): CHOL, HDL, LDLCALC, TRIG, CHOLHDL, LDLDIRECT in the last 72 hours. Thyroid Function Tests: No results for input(s): TSH, T4TOTAL, FREET4, T3FREE, THYROIDAB in the last 72 hours. Anemia Panel: Recent Labs    07/04/18 1415 07/04/18 1445 07/05/18 0242  VITAMINB12 725  --   --   FOLATE 13.7  --   --   FERRITIN 1,106*  --   --   TIBC 324  --   --   IRON 58  --   --   RETICCTPCT  --  2.0 1.4   Sepsis Labs: Recent Labs  Lab 07/04/18 1252 07/04/18 1433  LATICACIDVEN 2.6* 2.2*    Recent Results (from the past 240 hour(s))  Blood Culture (routine x 2)     Status: None (Preliminary result)   Collection Time: 07/04/18 12:30 PM  Result Value Ref Range Status   Specimen Description   Final    BLOOD RIGHT ANTECUBITAL Performed at [facility], [place] [address] [place], [place] [address]    Special Requests   Final     BOTTLES DRAWN AEROBIC AND ANAEROBIC Blood Culture adequate volume Performed at [place] [address] [place], [place] [address]    Culture   Final    NO GROWTH < 24 HOURS Performed at [place] [address], [place], [place] [address]    Report Status PENDING  Incomplete  Blood Culture (routine x 2)     Status: None (Preliminary result)   Collection Time: 07/04/18 12:40 PM  Result Value Ref Range Status   Specimen Description   Final    BLOOD LEFT ANTECUBITAL Performed at [place] [address], [place], [place] [address]    Special Requests   Final    BOTTLES DRAWN AEROBIC AND ANAEROBIC Blood Culture adequate volume Performed at [place] [address], [place], [place] [address]    Culture   Final    NO GROWTH < 24 HOURS  Performed at [facility], [place] [address] [place], [place] [address]    Report Status PENDING  Incomplete  SARS Coronavirus 2 (CEPHEID- Performed in [place] hospital lab), Hosp Order     Status: None   Collection Time: 07/04/18  1:50 PM  Result Value Ref Range Status   SARS Coronavirus 2 NEGATIVE NEGATIVE Final    Comment: (NOTE) If result is NEGATIVE SARS-CoV-2 target nucleic acids are NOT DETECTED. The SARS-CoV-2 RNA is generally detectable in upper and lower  respiratory specimens during the acute phase of infection. The lowest  concentration of SARS-CoV-2 viral copies this assay can detect is 250  copies / mL. A negative result does not preclude SARS-CoV-2 infection  and should not be used as the sole basis for treatment or other  patient management decisions.  A negative result may occur with  improper specimen collection / handling, submission of specimen other  than nasopharyngeal swab, presence of viral mutation(s) within the  areas targeted by this assay, and inadequate number of viral copies  (<250 copies / mL). A negative result must be combined with  clinical  observations, patient history, and epidemiological information. If result is POSITIVE SARS-CoV-2 target nucleic acids are DETECTED. The SARS-CoV-2 RNA is generally detectable in upper and lower  respiratory specimens dur ing the acute phase of infection.  Positive  results are indicative of active infection with SARS-CoV-2.  Clinical  correlation with patient history and other diagnostic information is  necessary to determine patient infection status.  Positive results do  not rule out bacterial infection or co-infection with other viruses. If result is PRESUMPTIVE POSTIVE SARS-CoV-2 nucleic acids MAY BE PRESENT.   A presumptive positive result was obtained on the submitted specimen  and confirmed on repeat testing.  While 2019 novel coronavirus  (SARS-CoV-2) nucleic acids may be present in the submitted sample  additional confirmatory testing may be necessary for epidemiological  and / or clinical management purposes  to differentiate between  SARS-CoV-2 and other Sarbecovirus currently known to infect humans.  If clinically indicated additional testing with an alternate test  methodology [phone]) is advised. The SARS-CoV-2 RNA is generally  detectable in upper and lower respiratory sp ecimens during the acute  phase of infection. The expected result is Negative. Fact Sheet for Patients:  StrictlyIdeas.no Fact Sheet for Healthcare Providers: BankingDealers.co.za This test is not yet approved or cleared by the [place] FDA and has been authorized for detection and/or diagnosis of SARS-CoV-2 by FDA under an Emergency Use Authorization (EUA).  This EUA will remain in effect (meaning this test can be used) for the duration of the COVID-19 declaration under Section 564(b)(1) of the Act, 21 U.S.C. section 360bbb-3(b)(1), unless the authorization is terminated or revoked sooner. Performed at [facility],  [place] [address] [place], [place] [address]   MRSA PCR Screening     Status: None   Collection Time: 07/04/18  4:21 PM  Result Value Ref Range Status   MRSA by PCR NEGATIVE NEGATIVE Final    Comment:        The GeneXpert MRSA Assay (FDA approved for NASAL specimens only), is one component of a comprehensive MRSA colonization surveillance program. It is not intended to diagnose MRSA infection nor to guide or monitor treatment for MRSA infections. Performed at [facility], [place] [address], [place],  [address]   Aerobic/Anaerobic Culture (surgical/deep wound)     Status: None (Preliminary result)   Collection Time: 07/05/18  11:06 AM  Result Value Ref Range Status   Specimen Description   Final    BONE MARROW Performed at [place] [address], [place], [place] [address]    Special Requests   Final    NONE Performed at [facility], [place] [address], [place], [place] [address]    Gram Stain   Final    RARE WBC PRESENT,BOTH PMN AND MONONUCLEAR NO ORGANISMS SEEN Performed at [facility], [place] [address] [place], [place] [address]    Culture PENDING  Incomplete   Report Status PENDING  Incomplete         Radiology Studies: Ct Chest W Contrast  Result Date: 07/04/2018 CLINICAL DATA:  Increased shortness of breath EXAM: CT CHEST WITH CONTRAST TECHNIQUE: Multidetector CT imaging of the chest was performed during intravenous contrast administration. CONTRAST:  54m OMNIPAQUE IOHEXOL 300 MG/ML  SOLN COMPARISON:  05/28/2018 FINDINGS: Cardiovascular: No significant vascular findings. Normal heart size. No pericardial effusion. Mediastinum/Nodes: Stable mediastinal and bilateral hilar lymphadenopathy measuring up to 17 mm in short axis along the right paratracheal location. Normal trachea. Normal esophagus. Normal thyroid gland. Lungs/Pleura: Diffuse bilateral interstitial thickening and fissural thickening.  Peripheral sub pleural thickening again noted bilaterally. Abnormality involves bilateral upper and lower lungs with the lung bases least affected. Trace left pleural effusion. No right pleural effusion. Upper Abdomen: No acute abnormality. Musculoskeletal: No chest wall abnormality. No acute or significant osseous findings. IMPRESSION: 1. Worsening diffuse bilateral interstitial and fissural thickening with sub pleural thickening and scattered areas of ground-glass opacity. Differential considerations include chronic interstitial lung disease such as sarcoidosis versus atypical infection given the patient's history of HIV. Electronically Signed   By: HKathreen Devoid  On: 07/04/2018 18:34   Ct Biopsy  Result Date: 07/05/2018 INDICATION: History of HIV/AIDS, now with anemia and thrombocytopenia. Please perform CT-guided bone marrow biopsy for tissue diagnostic purposes. Please obtain a sample for additional culture analysis. EXAM: CT-GUIDED BONE MARROW BIOPSY AND ASPIRATION MEDICATIONS: None ANESTHESIA/SEDATION: Versed 2 mg IV Sedation Time: 10 Minutes; The patient was continuously monitored during the procedure by the interventional radiology nurse under my direct supervision. COMPLICATIONS: None immediate. PROCEDURE: Informed consent was obtained from the patient following an explanation of the procedure, risks, benefits and alternatives. The patient understands, agrees and consents for the procedure. All questions were addressed. A time out was performed prior to the initiation of the procedure. The patient was positioned prone and non-contrast localization CT was performed of the pelvis to demonstrate the iliac marrow spaces. The operative site was prepped and draped in the usual sterile fashion. Under sterile conditions and local anesthesia, a 22 gauge spinal needle was utilized for procedural planning. Next, an 11 gauge coaxial bone biopsy needle was advanced into the left iliac marrow space. Needle position  was confirmed with CT imaging. Initially, bone marrow aspiration was performed. Next, a bone marrow biopsy was obtained with the 11 gauge outer bone marrow device. Samples were prepared with the cytotechnologist and deemed adequate. The needle was removed intact. Hemostasis was obtained with compression and a dressing was placed. The patient tolerated the procedure well without immediate post procedural complication. IMPRESSION: Successful CT guided left iliac bone marrow aspiration and core biopsy. Note, a portion of the core biopsy with set aside for culture analysis. Electronically Signed   By: JSandi MariscalM.D.   On: 07/05/2018 11:43   Dg Chest Portable 1 View  Result Date: 07/04/2018 CLINICAL DATA:  Worsening shortness of breath and cough EXAM: PORTABLE CHEST 1 VIEW COMPARISON:  06/18/2018 FINDINGS: There is bilateral diffuse reticular interstitial thickening. There is no focal consolidation. There is no pleural effusion or pneumothorax. The heart and mediastinal contours are unremarkable. The osseous structures are unremarkable. IMPRESSION: Bilateral diffuse reticular interstitial thickening which may reflect mild interstitial edema versus pneumonia including atypical pneumonia. Electronically Signed   By: Kathreen Devoid   On: 07/04/2018 12:57   Ct Bone Marrow Biopsy & Aspiration  Result Date: 07/05/2018 INDICATION: History of HIV/AIDS, now with anemia and thrombocytopenia. Please perform CT-guided bone marrow biopsy for tissue diagnostic purposes. Please obtain a sample for additional culture analysis. EXAM: CT-GUIDED BONE MARROW BIOPSY AND ASPIRATION MEDICATIONS: None ANESTHESIA/SEDATION: Versed 2 mg IV Sedation Time: 10 Minutes; The patient was continuously monitored during the procedure by the interventional radiology nurse under my direct supervision. COMPLICATIONS: None immediate. PROCEDURE: Informed consent was obtained from the patient following an explanation of the procedure, risks, benefits and  alternatives. The patient understands, agrees and consents for the procedure. All questions were addressed. A time out was performed prior to the initiation of the procedure. The patient was positioned prone and non-contrast localization CT was performed of the pelvis to demonstrate the iliac marrow spaces. The operative site was prepped and draped in the usual sterile fashion. Under sterile conditions and local anesthesia, a 22 gauge spinal needle was utilized for procedural planning. Next, an 11 gauge coaxial bone biopsy needle was advanced into the left iliac marrow space. Needle position was confirmed with CT imaging. Initially, bone marrow aspiration was performed. Next, a bone marrow biopsy was obtained with the 11 gauge outer bone marrow device. Samples were prepared with the cytotechnologist and deemed adequate. The needle was removed intact. Hemostasis was obtained with compression and a dressing was placed. The patient tolerated the procedure well without immediate post procedural complication. IMPRESSION: Successful CT guided left iliac bone marrow aspiration and core biopsy. Note, a portion of the core biopsy with set aside for culture analysis. Electronically Signed   By: Sandi Mariscal M.D.   On: 07/05/2018 11:43        Scheduled Meds:  sodium chloride   Intravenous Once   sodium chloride   Intravenous Once   B-complex with vitamin C  1 tablet Oral Daily   bictegravir-emtricitabine-tenofovir AF  1 tablet Oral Daily   Chlorhexidine Gluconate Cloth  6 each Topical [id number]   folic acid  2 mg Oral Daily   mouth rinse  15 mL Mouth Rinse BID   predniSONE  40 mg Oral Q breakfast   sulfamethoxazole-trimethoprim  2 tablet Oral Q8H   Continuous Infusions:    LOS: 2 days    Time spent: 25 minutes    Barb Merino, MD [facility] Pager [phone]  If 7PM-7AM, please contact night-coverage [email or web address] Password [facility] 07/06/2018, 8:33 AM

## 2018-07-06 NOTE — Progress Notes (Signed)
INFECTIOUS DISEASE PROGRESS NOTE  ID: Krystal Short is a 33 y.o. female with  Principal Problem:   Acute respiratory failure with hypoxia ([facility]) Active Problems:   Tachycardia   AIDS (HCC)   Pneumocystis jiroveci pneumonia (HCC)   DOE (dyspnea on exertion)   Symptomatic anemia   Thrombocytopenia (HCC)  Subjective: Now on 2L [place] but feels like her breathing is better.  Complains of pain at bone marrow bx site.    Abtx:  Anti-infectives (From admission, onward)   Start     Dose/Rate Route Frequency Ordered Stop   07/05/18 1000  bictegravir-emtricitabine-tenofovir AF (BIKTARVY) 50-200-25 MG per tablet 1 tablet     1 tablet Oral Daily 07/04/18 1709     07/04/18 2000  sulfamethoxazole-trimethoprim (BACTRIM DS) 800-160 MG per tablet 2 tablet     2 tablet Oral Every 8 hours 07/04/18 1936     07/04/18 1315  vancomycin (VANCOCIN) IVPB 1000 mg/200 mL premix     1,000 mg 200 mL/hr over 60 Minutes Intravenous  Once 07/04/18 1305 07/04/18 1434   07/04/18 1315  ceFEPIme (MAXIPIME) 2 g in sodium chloride 0.9 % 100 mL IVPB     2 g 200 mL/hr over 30 Minutes Intravenous  Once 07/04/18 1305 07/04/18 1435   07/04/18 1315  azithromycin (ZITHROMAX) 500 mg in sodium chloride 0.9 % 250 mL IVPB     500 mg 250 mL/hr over 60 Minutes Intravenous  Once 07/04/18 1305 07/04/18 1435      Medications:  Scheduled:  sodium chloride   Intravenous Once   sodium chloride   Intravenous Once   B-complex with vitamin C  1 tablet Oral Daily   bictegravir-emtricitabine-tenofovir AF  1 tablet Oral Daily   Chlorhexidine Gluconate Cloth  6 each Topical [id number]   folic acid  2 mg Oral Daily   mouth rinse  15 mL Mouth Rinse BID   predniSONE  40 mg Oral Q breakfast   sulfamethoxazole-trimethoprim  2 tablet Oral Q8H    Objective: Vital signs in last 24 hours: Temp:  [96.8 F (36 C)-97.9 F (36.6 C)] 97.7 F (36.5 C) (05/30 1100) Pulse Rate:  [83-134] 96 (05/30 1100) Resp:  [17-26] 18 (05/30  1100) BP: (85-120)/(37-89) 90/58 (05/30 1100) SpO2:  [47 %-99 %] 95 % (05/30 1100) Weight:  [61 kg] 61 kg (05/30 1100)   General appearance: alert, cooperative and no distress Resp: diminished breath sounds anterior - bilateral Cardio: regular rate and rhythm GI: normal findings: bowel sounds normal and soft, non-tender Extremities: edema none  Lab Results Recent Labs    07/04/18 1254  07/05/18 0242 07/05/18 1621  WBC 6.6   < > 9.7 9.3  HGB 5.1*   < > 8.0* 8.0*  HCT 16.8*   < > 24.8* 25.6*  NA 128*  --  130*  --   K 4.1  --  4.3  --   CL 94*  --  101  --   CO2 23  --  21*  --   BUN 26*  --  14  --   CREATININE 1.13*  --  0.82  --    < > = values in this interval not displayed.   Liver Panel Recent Labs    07/04/18 1254 07/05/18 0242  PROT 7.3 6.2*  ALBUMIN 2.4* 2.0*  AST 39 32  ALT 12 12  ALKPHOS 153* 104  BILITOT 1.4* 0.8   Sedimentation Rate No results for input(s): ESRSEDRATE in the last 72 hours. C-Reactive  Protein Recent Labs    07/04/18 1415  CRP 32.7*    Microbiology: Recent Results (from the past 240 hour(s))  Blood Culture (routine x 2)     Status: None (Preliminary result)   Collection Time: 07/04/18 12:30 PM  Result Value Ref Range Status   Specimen Description   Final    BLOOD RIGHT ANTECUBITAL Performed at [facility] [address], [place], [place] [address]    Special Requests   Final    BOTTLES DRAWN AEROBIC AND ANAEROBIC Blood Culture adequate volume Performed at [facility] [address], [place], [place] [address]    Culture   Final    NO GROWTH 2 DAYS Performed at [facility] [address], [place], [place] [address]    Report Status PENDING  Incomplete  Blood Culture (routine x 2)     Status: None (Preliminary result)   Collection Time: 07/04/18 12:40 PM  Result Value Ref Range Status   Specimen Description   Final    BLOOD LEFT ANTECUBITAL Performed at [facility] [address], [place], [place] [address]    Special Requests   Final    BOTTLES DRAWN AEROBIC AND ANAEROBIC Blood Culture adequate volume Performed at [facility] [address] [place], [place] [address]    Culture   Final    NO GROWTH 2 DAYS Performed at [facility] [address] [place], [place] [address]    Report Status PENDING  Incomplete  SARS Coronavirus 2 (CEPHEID- Performed in [facility] hospital lab), Hosp Order     Status: None   Collection Time: 07/04/18  1:50 PM  Result Value Ref Range Status   SARS Coronavirus 2 NEGATIVE NEGATIVE Final    Comment: (NOTE) If result is NEGATIVE SARS-CoV-2 target nucleic acids are NOT DETECTED. The SARS-CoV-2 RNA is generally detectable in upper and lower  respiratory specimens during the acute phase of infection. The lowest  concentration of SARS-CoV-2 viral copies this assay can detect is 250  copies / mL. A negative result does not preclude SARS-CoV-2 infection  and should not be used as the sole basis for treatment or other  patient management decisions.  A negative result may occur with  improper specimen collection / handling, submission of specimen other  than nasopharyngeal swab, presence of viral mutation(s) within the  areas targeted by this assay, and inadequate number of viral copies  (<250 copies / mL). A negative result must be combined with clinical  observations, patient history, and epidemiological information. If result is POSITIVE SARS-CoV-2 target nucleic acids are DETECTED. The SARS-CoV-2 RNA is generally detectable in upper and lower  respiratory specimens dur ing the acute phase of infection.  Positive  results are indicative of active infection with SARS-CoV-2.  Clinical  correlation with patient history and other diagnostic information is  necessary to determine patient infection status.  Positive results do  not rule out bacterial infection or  co-infection with other viruses. If result is PRESUMPTIVE POSTIVE SARS-CoV-2 nucleic acids MAY BE PRESENT.   A presumptive positive result was obtained on the submitted specimen  and confirmed on repeat testing.  While 2019 novel coronavirus  (SARS-CoV-2) nucleic acids may be present in the submitted sample  additional confirmatory testing may be necessary for epidemiological  and / or clinical management purposes  to differentiate between  SARS-CoV-2 and other Sarbecovirus currently known to infect humans.  If clinically indicated additional  testing with an alternate test  methodology [id number]) is advised. The SARS-CoV-2 RNA is generally  detectable in upper and lower respiratory sp ecimens during the acute  phase of infection. The expected result is Negative. Fact Sheet for Patients:  StrictlyIdeas.no Fact Sheet for Healthcare Providers: BankingDealers.co.za This test is not yet approved or cleared by the [place] FDA and has been authorized for detection and/or diagnosis of SARS-CoV-2 by FDA under an Emergency Use Authorization (EUA).  This EUA will remain in effect (meaning this test can be used) for the duration of the COVID-19 declaration under Section 564(b)(1) of the Act, 21 U.S.C. section 360bbb-3(b)(1), unless the authorization is terminated or revoked sooner. Performed at [facility], [place] [address], [place], [place] [address]   MRSA PCR Screening     Status: None   Collection Time: 07/04/18  4:21 PM  Result Value Ref Range Status   MRSA by PCR NEGATIVE NEGATIVE Final    Comment:        The GeneXpert MRSA Assay (FDA approved for NASAL specimens only), is one component of a comprehensive MRSA colonization surveillance program. It is not intended to diagnose MRSA infection nor to guide or monitor treatment for MRSA infections. Performed at [facility], [place] [address]  [place], [place] [address]   Aerobic/Anaerobic Culture (surgical/deep wound)     Status: None (Preliminary result)   Collection Time: 07/05/18 11:06 AM  Result Value Ref Range Status   Specimen Description   Final    BONE MARROW Performed at [facility] [address] [place], [place] [address]    Special Requests   Final    NONE Performed at [facility], [place] [address] [place], [place] [address]    Gram Stain   Final    RARE WBC PRESENT,BOTH PMN AND MONONUCLEAR NO ORGANISMS SEEN    Culture   Final    NO GROWTH < 24 HOURS Performed at [facility], [place] [address], [place], [place] [address]    Report Status PENDING  Incomplete    Studies/Results: Ct Chest W Contrast  Result Date: 07/04/2018 CLINICAL DATA:  Increased shortness of breath EXAM: CT CHEST WITH CONTRAST TECHNIQUE: Multidetector CT imaging of the chest was performed during intravenous contrast administration. CONTRAST:  81m OMNIPAQUE IOHEXOL 300 MG/ML  SOLN COMPARISON:  05/28/2018 FINDINGS: Cardiovascular: No significant vascular findings. Normal heart size. No pericardial effusion. Mediastinum/Nodes: Stable mediastinal and bilateral hilar lymphadenopathy measuring up to 17 mm in short axis along the right paratracheal location. Normal trachea. Normal esophagus. Normal thyroid gland. Lungs/Pleura: Diffuse bilateral interstitial thickening and fissural thickening. Peripheral sub pleural thickening again noted bilaterally. Abnormality involves bilateral upper and lower lungs with the lung bases least affected. Trace left pleural effusion. No right pleural effusion. Upper Abdomen: No acute abnormality. Musculoskeletal: No chest wall abnormality. No acute or significant osseous findings. IMPRESSION: 1. Worsening diffuse bilateral interstitial and fissural thickening with sub pleural thickening and scattered areas of ground-glass opacity. Differential considerations include chronic  interstitial lung disease such as sarcoidosis versus atypical infection given the patient's history of HIV. Electronically Signed   By: HKathreen Devoid  On: 07/04/2018 18:34   Ct Biopsy  Result Date: 07/05/2018 INDICATION: History of HIV/AIDS, now with anemia and thrombocytopenia. Please perform CT-guided bone marrow biopsy for tissue diagnostic purposes. Please obtain a sample for additional culture analysis. EXAM: CT-GUIDED BONE MARROW BIOPSY AND ASPIRATION MEDICATIONS: None ANESTHESIA/SEDATION: Versed 2 mg IV Sedation  Time: 10 Minutes; The patient was continuously monitored during the procedure by the interventional radiology nurse under my direct supervision. COMPLICATIONS: None immediate. PROCEDURE: Informed consent was obtained from the patient following an explanation of the procedure, risks, benefits and alternatives. The patient understands, agrees and consents for the procedure. All questions were addressed. A time out was performed prior to the initiation of the procedure. The patient was positioned prone and non-contrast localization CT was performed of the pelvis to demonstrate the iliac marrow spaces. The operative site was prepped and draped in the usual sterile fashion. Under sterile conditions and local anesthesia, a 22 gauge spinal needle was utilized for procedural planning. Next, an 11 gauge coaxial bone biopsy needle was advanced into the left iliac marrow space. Needle position was confirmed with CT imaging. Initially, bone marrow aspiration was performed. Next, a bone marrow biopsy was obtained with the 11 gauge outer bone marrow device. Samples were prepared with the cytotechnologist and deemed adequate. The needle was removed intact. Hemostasis was obtained with compression and a dressing was placed. The patient tolerated the procedure well without immediate post procedural complication. IMPRESSION: Successful CT guided left iliac bone marrow aspiration and core biopsy. Note, a portion of  the core biopsy with set aside for culture analysis. Electronically Signed   By: Sandi Mariscal M.D.   On: 07/05/2018 11:43   Ct Bone Marrow Biopsy & Aspiration  Result Date: 07/05/2018 INDICATION: History of HIV/AIDS, now with anemia and thrombocytopenia. Please perform CT-guided bone marrow biopsy for tissue diagnostic purposes. Please obtain a sample for additional culture analysis. EXAM: CT-GUIDED BONE MARROW BIOPSY AND ASPIRATION MEDICATIONS: None ANESTHESIA/SEDATION: Versed 2 mg IV Sedation Time: 10 Minutes; The patient was continuously monitored during the procedure by the interventional radiology nurse under my direct supervision. COMPLICATIONS: None immediate. PROCEDURE: Informed consent was obtained from the patient following an explanation of the procedure, risks, benefits and alternatives. The patient understands, agrees and consents for the procedure. All questions were addressed. A time out was performed prior to the initiation of the procedure. The patient was positioned prone and non-contrast localization CT was performed of the pelvis to demonstrate the iliac marrow spaces. The operative site was prepped and draped in the usual sterile fashion. Under sterile conditions and local anesthesia, a 22 gauge spinal needle was utilized for procedural planning. Next, an 11 gauge coaxial bone biopsy needle was advanced into the left iliac marrow space. Needle position was confirmed with CT imaging. Initially, bone marrow aspiration was performed. Next, a bone marrow biopsy was obtained with the 11 gauge outer bone marrow device. Samples were prepared with the cytotechnologist and deemed adequate. The needle was removed intact. Hemostasis was obtained with compression and a dressing was placed. The patient tolerated the procedure well without immediate post procedural complication. IMPRESSION: Successful CT guided left iliac bone marrow aspiration and core biopsy. Note, a portion of the core biopsy with set  aside for culture analysis. Electronically Signed   By: Sandi Mariscal M.D.   On: 07/05/2018 11:43     Assessment/Plan: PCP vs Sarc[facility] AIDS (April 2019) Anemia, Thrombocytopenia  Total days of antibiotics: 2 bactrim/prednisone biktarvy  Her anemia is stable, her thrombocytopenia is improved.  Her prev LDH was quite high (595) Her Bone Marrow Bx/Cx are pending.  Will continue to watch, overall she feels better.          Bobby Rumpf MD, FACP Infectious Diseases (pager) [phone] [email or web address] 07/06/2018, 1:29 PM  LOS: 2 days

## 2018-07-06 NOTE — Progress Notes (Signed)
Pt arrived to rm 1520 at 1100.

## 2018-07-07 ENCOUNTER — Other Ambulatory Visit: Payer: Self-pay | Admitting: Infectious Diseases

## 2018-07-07 DIAGNOSIS — B20 Human immunodeficiency virus [HIV] disease: Secondary | ICD-10-CM

## 2018-07-07 LAB — PREPARE PLATELET PHERESIS: Unit division: 0

## 2018-07-07 LAB — BPAM PLATELET PHERESIS
Blood Product Expiration Date: 202005302359
ISSUE DATE / TIME: 202005291335
Unit Type and Rh: 8400

## 2018-07-07 LAB — CBC
HCT: 23.1 % — ABNORMAL LOW (ref 36.0–46.0)
Hemoglobin: 7.2 g/dL — ABNORMAL LOW (ref 12.0–15.0)
MCH: 30.8 pg (ref 26.0–34.0)
MCHC: 31.2 g/dL (ref 30.0–36.0)
MCV: 98.7 fL (ref 80.0–100.0)
Platelets: 21 10*3/uL — CL (ref 150–400)
RBC: 2.34 MIL/uL — ABNORMAL LOW (ref 3.87–5.11)
RDW: 21.3 % — ABNORMAL HIGH (ref 11.5–15.5)
WBC: 6.7 10*3/uL (ref 4.0–10.5)
nRBC: 3.6 % — ABNORMAL HIGH (ref 0.0–0.2)

## 2018-07-07 NOTE — Progress Notes (Signed)
CRITICAL VALUE ALERT  Critical Value:  Platelets 21   Date & Time Notied:  07/07/18 at 0655  Provider Notified: [facility]  Orders Received/Actions taken: pending orders , on-coming shift RN made aware of this lab valve

## 2018-07-07 NOTE — Progress Notes (Signed)
PROGRESS NOTE    Krystal Short  QMG:[id number] DOB: [birth year] DOA: 07/04/2018 PCP: [name]    Brief Narrative:   33 year old female with history of HIV/AIDS, chronic anemia and thrombocytopenia with recent admission for suspected PCP infection, latest CD4 count 35 presented to the hospital secondary to worsening dyspnea on exertion, cough and fever as high as 103 for about a week.  Multiple COVID-19 test negative. In the emergency room, afebrile, tachycardic and tachypneic, oxygen 86% on room air, hemoglobin 5.1, platelets 14, CT scan showed diffuse bilateral infiltrates suspected PCP.  Admitted with hematology and ID consultation.  Assessment & Plan:   Principal Problem:   Acute respiratory failure with hypoxia (HCC) Active Problems:   Tachycardia   AIDS (HCC)   Pneumocystis jiroveci pneumonia (HCC)   DOE (dyspnea on exertion)   Symptomatic anemia   Thrombocytopenia (HCC)  Acute hypoxic respiratory failure: Suspected secondary to recurrent PJP or other atypical pneumonias versus sarcoidosis.  Most likely PJP. Continue on oxygen to keep saturations more than 90%. Incentive spirometry deep breathing exercises and albuterol inhaler. Patient was given a dose of vancomycin, ceftriaxone and azithromycin in the ER, infectious disease suggested to treat with Bactrim and steroid. Cultures are pending and negative so far.  Acute on chronic anemia, symptomatic anemia and thrombocytopenia: Under investigation. Suspect secondary to primary HIV disease.  No evidence of hemolysis. Received 2 units of PRBC transfusion with appropriate response.  Now down to 7.2. We will continue to monitor. Platelets 9000 and received 1 units of platelets , 21 today. Followed by hematology and appreciate their input.  On multivitamins. Status post bone marrow biopsy, results are pending.  Followed by hematology.  Sepsis with no endorgan dysfunction: Fairly stable.  Currently  metallic stable.  Cultures are pending.  AIDS/HIV: Viral load undetectable.  CD4 count less than 35.  On Biktarvy and followed by ID.  Acute kidney injury: Improved with IV fluid administration.  DVT prophylaxis: SCDs Code Status: Full code Family Communication: None Disposition Plan: MedSurg bed.  Continues to need hospitalization.   Consultants:   Infectious disease  Hematology  Radiology  Procedures:   None  Antimicrobials:   Bactrim, 07/04/2018   Subjective: Patient seen and examined.  Afebrile overnight.  On 2 L oxygen.  Denies any nausea or vomiting.  Objective: Vitals:   07/06/18 1355 07/06/18 1958 07/07/18 0509 07/07/18 1307  BP: 94/68 98/78 (!) 82/51 (!) 90/55  Pulse: 92 88 (!) 102 (!) 104  Resp: 18 16 16 16   Temp: 97.6 F (36.4 C) 98.2 F (36.8 C) 98.4 F (36.9 C) 98.6 F (37 C)  TempSrc: Oral Oral Oral Oral  SpO2: 99% (!) 87% 94% 95%  Weight:      Height:        Intake/Output Summary (Last 24 hours) at 07/07/2018 1515 Last data filed at 07/07/2018 1300 Gross per 24 hour  Intake 240 ml  Output 701 ml  Net -461 ml   Filed Weights   07/04/18 1313 07/06/18 1100  Weight: 56.7 kg 61 kg    Examination:  General exam: Appears calm and comfortable, on 2 L oxygen. chronically sick looking . Respiratory system: Clear to auscultation. Respiratory effort normal. Cardiovascular system: S1 & S2 heard, RRR. No JVD, murmurs, rubs, gallops or clicks. No pedal edema. Gastrointestinal system: Abdomen is nondistended, soft and nontender. No organomegaly or masses felt. Normal bowel sounds heard. Central nervous system: Alert and oriented. No focal neurological deficits. Extremities: Symmetric 5 x 5  power. Skin: No rashes, lesions or ulcers Psychiatry: Judgement and insight appear normal. Mood & affect appropriate.     Data Reviewed: I have personally reviewed following labs and imaging studies  CBC: Recent Labs  Lab 07/04/18 1254 07/04/18 2358  07/05/18 0242 07/05/18 1621 07/07/18 0528  WBC 6.6 9.4 9.7 9.3 6.7  NEUTROABS 4.1  --  5.5 5.2  --   HGB 5.1* 9.0* 8.0* 8.0* 7.2*  HCT 16.8* 27.3* 24.8* 25.6* 23.1*  MCV 107.0* 95.8 95.8 98.5 98.7  PLT 14* 11* 9*  9* 39* 21*   Basic Metabolic Panel: Recent Labs  Lab 07/04/18 1254 07/05/18 0242  NA 128* 130*  K 4.1 4.3  CL 94* 101  CO2 23 21*  GLUCOSE 116* 132*  BUN 26* 14  CREATININE 1.13* 0.82  CALCIUM 7.4* 7.1*   GFR: Estimated Creatinine Clearance: 85.5 mL/min (by C-G formula based on SCr of 0.82 mg/dL). Liver Function Tests: Recent Labs  Lab 07/04/18 1254 07/05/18 0242  AST 39 32  ALT 12 12  ALKPHOS 153* 104  BILITOT 1.4* 0.8  PROT 7.3 6.2*  ALBUMIN 2.4* 2.0*   No results for input(s): LIPASE, AMYLASE in the last 168 hours. No results for input(s): AMMONIA in the last 168 hours. Coagulation Profile: Recent Labs  Lab 07/05/18 0242  INR 1.4*   Cardiac Enzymes: No results for input(s): CKTOTAL, CKMB, CKMBINDEX, TROPONINI in the last 168 hours. BNP (last 3 results) No results for input(s): PROBNP in the last 8760 hours. HbA1C: No results for input(s): HGBA1C in the last 72 hours. CBG: No results for input(s): GLUCAP in the last 168 hours. Lipid Profile: No results for input(s): CHOL, HDL, LDLCALC, TRIG, CHOLHDL, LDLDIRECT in the last 72 hours. Thyroid Function Tests: No results for input(s): TSH, T4TOTAL, FREET4, T3FREE, THYROIDAB in the last 72 hours. Anemia Panel: Recent Labs    07/05/18 0242  RETICCTPCT 1.4   Sepsis Labs: Recent Labs  Lab 07/04/18 1252 07/04/18 1433  LATICACIDVEN 2.6* 2.2*    Recent Results (from the past 240 hour(s))  Blood Culture (routine x 2)     Status: None (Preliminary result)   Collection Time: 07/04/18 12:30 PM  Result Value Ref Range Status   Specimen Description   Final    BLOOD RIGHT ANTECUBITAL Performed at [facility] [address] [place], [place] [address]    Special Requests    Final    BOTTLES DRAWN AEROBIC AND ANAEROBIC Blood Culture adequate volume Performed at [facility] [address], [place], [place] [address]    Culture   Final    NO GROWTH 2 DAYS Performed at [facility] [address], [place], [place] [address]    Report Status PENDING  Incomplete  Blood Culture (routine x 2)     Status: None (Preliminary result)   Collection Time: 07/04/18 12:40 PM  Result Value Ref Range Status   Specimen Description   Final    BLOOD LEFT ANTECUBITAL Performed at [facility] [address] [place], [place] [address]    Special Requests   Final    BOTTLES DRAWN AEROBIC AND ANAEROBIC Blood Culture adequate volume Performed at [facility] [address], [place], [place] [address]    Culture   Final    NO GROWTH 2 DAYS Performed at [facility] [address], [place], [place] [address]    Report Status PENDING  Incomplete  SARS Coronavirus 2 (  CEPHEID- Performed in [facility] hospital lab), Hosp Order     Status: None   Collection Time: 07/04/18  1:50 PM  Result Value Ref Range Status   SARS Coronavirus 2 NEGATIVE NEGATIVE Final    Comment: (NOTE) If result is NEGATIVE SARS-CoV-2 target nucleic acids are NOT DETECTED. The SARS-CoV-2 RNA is generally detectable in upper and lower  respiratory specimens during the acute phase of infection. The lowest  concentration of SARS-CoV-2 viral copies this assay can detect is 250  copies / mL. A negative result does not preclude SARS-CoV-2 infection  and should not be used as the sole basis for treatment or other  patient management decisions.  A negative result may occur with  improper specimen collection / handling, submission of specimen other  than nasopharyngeal swab, presence of viral mutation(s) within the  areas targeted by this assay, and inadequate number of viral copies  (<250 copies / mL). A negative result must be combined  with clinical  observations, patient history, and epidemiological information. If result is POSITIVE SARS-CoV-2 target nucleic acids are DETECTED. The SARS-CoV-2 RNA is generally detectable in upper and lower  respiratory specimens dur ing the acute phase of infection.  Positive  results are indicative of active infection with SARS-CoV-2.  Clinical  correlation with patient history and other diagnostic information is  necessary to determine patient infection status.  Positive results do  not rule out bacterial infection or co-infection with other viruses. If result is PRESUMPTIVE POSTIVE SARS-CoV-2 nucleic acids MAY BE PRESENT.   A presumptive positive result was obtained on the submitted specimen  and confirmed on repeat testing.  While 2019 novel coronavirus  (SARS-CoV-2) nucleic acids may be present in the submitted sample  additional confirmatory testing may be necessary for epidemiological  and / or clinical management purposes  to differentiate between  SARS-CoV-2 and other Sarbecovirus currently known to infect humans.  If clinically indicated additional testing with an alternate test  methodology [phone]) is advised. The SARS-CoV-2 RNA is generally  detectable in upper and lower respiratory sp ecimens during the acute  phase of infection. The expected result is Negative. Fact Sheet for Patients:  StrictlyIdeas.no Fact Sheet for Healthcare Providers: BankingDealers.co.za This test is not yet approved or cleared by the [place] FDA and has been authorized for detection and/or diagnosis of SARS-CoV-2 by FDA under an Emergency Use Authorization (EUA).  This EUA will remain in effect (meaning this test can be used) for the duration of the COVID-19 declaration under Section 564(b)(1) of the Act, 21 U.S.C. section 360bbb-3(b)(1), unless the authorization is terminated or revoked sooner. Performed at [facility], [place] [address], [place], [place] [address]   MRSA PCR Screening     Status: None   Collection Time: 07/04/18  4:21 PM  Result Value Ref Range Status   MRSA by PCR NEGATIVE NEGATIVE Final    Comment:        The GeneXpert MRSA Assay (FDA approved for NASAL specimens only), is one component of a comprehensive MRSA colonization surveillance program. It is not intended to diagnose MRSA infection nor to guide or monitor treatment for MRSA infections. Performed at [facility], [place] [address], [place], [place] [address]   Aerobic/Anaerobic Culture (surgical/deep wound)     Status: None (Preliminary result)   Collection Time: 07/05/18 11:06 AM  Result Value Ref Range Status   Specimen Description   Final    BONE MARROW Performed at [facility], [place] [address] [place], [place] [address]    Special Requests   Final    NONE Performed at [facility], [place] [address], [place], [place] [address]    Gram Stain   Final    RARE WBC PRESENT,BOTH PMN AND MONONUCLEAR NO ORGANISMS SEEN    Culture   Final    NO GROWTH 2 DAYS NO ANAEROBES ISOLATED; CULTURE IN PROGRESS FOR 5 DAYS Performed at [facility], [place] [address], [place], [place] [address]    Report Status PENDING  Incomplete  Acid Fast Smear (AFB)     Status: None   Collection Time: 07/05/18 11:06 AM  Result Value Ref Range Status   AFB Specimen Processing Comment  Final    Comment: Tissue Grinding and Digestion/Decontamination   Acid Fast Smear Negative  Final    Comment: (NOTE) Performed At: [facility] [place], [place] [id number] Rush Farmer MD WP:[id number]    Source (AFB) BONE MARROW  Final    Comment: Performed at [facility], [place] [address], [place], [place] [address]         Radiology Studies: No results found.      Scheduled Meds: . sodium chloride   Intravenous Once  . sodium  chloride   Intravenous Once  . B-complex with vitamin C  1 tablet Oral Daily  . bictegravir-emtricitabine-tenofovir AF  1 tablet Oral Daily  . Chlorhexidine Gluconate Cloth  6 each Topical Q0600  . folic acid  2 mg Oral Daily  . mouth rinse  15 mL Mouth Rinse BID  . predniSONE  40 mg Oral Q breakfast  . sulfamethoxazole-trimethoprim  2 tablet Oral Q8H   Continuous Infusions:    LOS: 3 days    Time spent: 25 minutes    Barb Merino, MD [facility] Pager [phone]  If 7PM-7AM, please contact night-coverage [email or web address] Password [email or web address] 07/07/2018, 3:15 PM

## 2018-07-08 DIAGNOSIS — R5381 Other malaise: Secondary | ICD-10-CM

## 2018-07-08 DIAGNOSIS — Z79899 Other long term (current) drug therapy: Secondary | ICD-10-CM

## 2018-07-08 DIAGNOSIS — R05 Cough: Secondary | ICD-10-CM

## 2018-07-08 DIAGNOSIS — R5383 Other fatigue: Secondary | ICD-10-CM

## 2018-07-08 DIAGNOSIS — R918 Other nonspecific abnormal finding of lung field: Secondary | ICD-10-CM | POA: Diagnosis present

## 2018-07-08 DIAGNOSIS — R591 Generalized enlarged lymph nodes: Secondary | ICD-10-CM | POA: Diagnosis present

## 2018-07-08 DIAGNOSIS — R0602 Shortness of breath: Secondary | ICD-10-CM

## 2018-07-08 LAB — CBC
HCT: 20.2 % — ABNORMAL LOW (ref 36.0–46.0)
Hemoglobin: 6.6 g/dL — CL (ref 12.0–15.0)
MCH: 31 pg (ref 26.0–34.0)
MCHC: 32.7 g/dL (ref 30.0–36.0)
MCV: 94.8 fL (ref 80.0–100.0)
Platelets: 19 10*3/uL — CL (ref 150–400)
RBC: 2.13 MIL/uL — ABNORMAL LOW (ref 3.87–5.11)
RDW: 21.1 % — ABNORMAL HIGH (ref 11.5–15.5)
WBC: 6.6 10*3/uL (ref 4.0–10.5)
nRBC: 2.1 % — ABNORMAL HIGH (ref 0.0–0.2)

## 2018-07-08 LAB — HEMOGLOBIN AND HEMATOCRIT, BLOOD
HCT: 28.2 % — ABNORMAL LOW (ref 36.0–46.0)
Hemoglobin: 9.2 g/dL — ABNORMAL LOW (ref 12.0–15.0)

## 2018-07-08 LAB — PREPARE RBC (CROSSMATCH)

## 2018-07-08 MED ORDER — ALUM & MAG HYDROXIDE-SIMETH 200-200-20 MG/5ML PO SUSP
30.0000 mL | ORAL | Status: DC | PRN
  Administered 2018-07-08 – 2018-07-09 (×2): 30 mL via ORAL
  Filled 2018-07-08 (×2): qty 30

## 2018-07-08 MED ORDER — SODIUM CHLORIDE 0.9% IV SOLUTION: Freq: Once | INTRAVENOUS | Status: DC

## 2018-07-08 NOTE — Progress Notes (Signed)
CRITICAL VALUE ALERT  Critical Value:  Hgb- 6.6 and platelets - 19  Date & Time Notied:  07/08/2018 @0556   Provider Notified: floor coverage MD  Orders Received/Actions taken: pending

## 2018-07-08 NOTE — Progress Notes (Addendum)
HEMATOLOGY-ONCOLOGY PROGRESS NOTE  SUBJECTIVE:   Patient reports improvement in her breathing.  Has remained afebrile.  Denies bleeding.  Hemoglobin is down this morning and she is scheduled to receive 2 units of packed red blood cells.  Platelet count is slowly drifting down.  1 unit of platelets has been ordered already.  No other complaints this morning.  REVIEW OF SYSTEMS:   Constitutional: No fever since admission. Eyes: Denies blurriness of vision Ears, nose, mouth, throat, and face: Denies mucositis or sore throat Respiratory: Still has cough and shortness of breath. Cardiovascular: Denies palpitations, chest discomfort Gastrointestinal:  Denies nausea, heartburn or change in bowel habits Skin: Denies abnormal skin rashes Lymphatics: Denies new lymphadenopathy or easy bruising Neurological:Denies numbness, tingling or new weaknesses Behavioral/Psych: Mood is stable, no new changes  Extremities: No lower extremity edema All other systems were reviewed with the patient and are negative.  I have reviewed the past medical history, past surgical history, social history and family history with the patient and they are unchanged from previous note.   PHYSICAL EXAMINATION:  Vitals:   07/08/18 0702 07/08/18 0930  BP: (!) 85/65 104/69  Pulse: (!) 103 (!) 111  Resp: 20 (!) 22  Temp: 98.1 F (36.7 C) 98.5 F (36.9 C)  SpO2: 97% 97%   Filed Weights   07/04/18 1313 07/06/18 1100  Weight: 125 lb (56.7 kg) 134 lb 7.7 oz (61 kg)   . GENERAL:alert, in no acute distress and comfortable SKIN: no acute rashes, no significant lesions EYES: perl OROPHARYNX: MMM, no exudates, no oropharyngeal erythema or ulceration NECK: supple, no JVD LYMPH:  no palpable lymphadenopathy in the cervical, axillary or inguinal regions LUNGS: few basal rales on rhonci HEART: regular rate & rhythm ABDOMEN:  normoactive bowel sounds , non tender, not distended. Extremity: no pedal edema PSYCH: alert &  oriented x 3 with fluent speech NEURO: no focal motor/sensory deficits   LABORATORY DATA:  I have reviewed the data as listed CMP Latest Ref Rng & Units 07/05/2018 07/04/2018 06/20/2018  Glucose 70 - 99 mg/dL 132(H) 116(H) 75  BUN 6 - 20 mg/dL 14 26(H) 9  Creatinine 0.44 - 1.00 mg/dL 0.82 1.13(H) 0.75  Sodium 135 - 145 mmol/L 130(L) 128(L) 135  Potassium 3.5 - 5.1 mmol/L 4.3 4.1 4.6  Chloride 98 - 111 mmol/L 101 94(L) 102  CO2 22 - 32 mmol/L 21(L) 23 24  Calcium 8.9 - 10.3 mg/dL 7.1(L) 7.4(L) 8.6  Total Protein 6.5 - 8.1 g/dL 6.2(L) 7.3 7.2  Total Bilirubin 0.3 - 1.2 mg/dL 0.8 1.4(H) 1.1  Alkaline Phos 38 - 126 U/L 104 153(H) -  AST 15 - 41 U/L 32 39 34(H)  ALT 0 - 44 U/L 12 12 14   . CBC    Component Value Date/Time   WBC 6.6 07/08/2018 0511   RBC 2.13 (L) 07/08/2018 0511   HGB 6.6 (LL) 07/08/2018 0511   HCT 20.2 (L) 07/08/2018 0511   PLT 19 (LL) 07/08/2018 0511   MCV 94.8 07/08/2018 0511   MCH 31.0 07/08/2018 0511   MCHC 32.7 07/08/2018 0511   RDW 21.1 (H) 07/08/2018 0511   LYMPHSABS 1.3 07/05/2018 1621   MONOABS 2.2 (H) 07/05/2018 1621   EOSABS 0.0 07/05/2018 1621   BASOSABS 0.0 07/05/2018 1621     Dg Chest 2 View  Result Date: 06/18/2018 CLINICAL DATA:  Dyspnea on exertion EXAM: CHEST - 2 VIEW COMPARISON:  05/28/2018 FINDINGS: Cardiac shadow is within normal limits. The reticulonodular densities seen on prior  examination have nearly completely resolved. No sizable infiltrate or effusion is seen. No bony abnormality is noted. IMPRESSION: Near complete resolution of previously seen reticulonodular densities. Electronically Signed   By: Inez Catalina M.D.   On: 06/18/2018 15:47   Ct Chest W Contrast  Result Date: 07/04/2018 CLINICAL DATA:  Increased shortness of breath EXAM: CT CHEST WITH CONTRAST TECHNIQUE: Multidetector CT imaging of the chest was performed during intravenous contrast administration. CONTRAST:  57m OMNIPAQUE IOHEXOL 300 MG/ML  SOLN COMPARISON:  05/28/2018  FINDINGS: Cardiovascular: No significant vascular findings. Normal heart size. No pericardial effusion. Mediastinum/Nodes: Stable mediastinal and bilateral hilar lymphadenopathy measuring up to 17 mm in short axis along the right paratracheal location. Normal trachea. Normal esophagus. Normal thyroid gland. Lungs/Pleura: Diffuse bilateral interstitial thickening and fissural thickening. Peripheral sub pleural thickening again noted bilaterally. Abnormality involves bilateral upper and lower lungs with the lung bases least affected. Trace left pleural effusion. No right pleural effusion. Upper Abdomen: No acute abnormality. Musculoskeletal: No chest wall abnormality. No acute or significant osseous findings. IMPRESSION: 1. Worsening diffuse bilateral interstitial and fissural thickening with sub pleural thickening and scattered areas of ground-glass opacity. Differential considerations include chronic interstitial lung disease such as sarcoidosis versus atypical infection given the patient's history of HIV. Electronically Signed   By: HKathreen Devoid  On: 07/04/2018 18:34   Ct Biopsy  Result Date: 07/05/2018 INDICATION: History of HIV/AIDS, now with anemia and thrombocytopenia. Please perform CT-guided bone marrow biopsy for tissue diagnostic purposes. Please obtain a sample for additional culture analysis. EXAM: CT-GUIDED BONE MARROW BIOPSY AND ASPIRATION MEDICATIONS: None ANESTHESIA/SEDATION: Versed 2 mg IV Sedation Time: 10 Minutes; The patient was continuously monitored during the procedure by the interventional radiology nurse under my direct supervision. COMPLICATIONS: None immediate. PROCEDURE: Informed consent was obtained from the patient following an explanation of the procedure, risks, benefits and alternatives. The patient understands, agrees and consents for the procedure. All questions were addressed. A time out was performed prior to the initiation of the procedure. The patient was positioned prone  and non-contrast localization CT was performed of the pelvis to demonstrate the iliac marrow spaces. The operative site was prepped and draped in the usual sterile fashion. Under sterile conditions and local anesthesia, a 22 gauge spinal needle was utilized for procedural planning. Next, an 11 gauge coaxial bone biopsy needle was advanced into the left iliac marrow space. Needle position was confirmed with CT imaging. Initially, bone marrow aspiration was performed. Next, a bone marrow biopsy was obtained with the 11 gauge outer bone marrow device. Samples were prepared with the cytotechnologist and deemed adequate. The needle was removed intact. Hemostasis was obtained with compression and a dressing was placed. The patient tolerated the procedure well without immediate post procedural complication. IMPRESSION: Successful CT guided left iliac bone marrow aspiration and core biopsy. Note, a portion of the core biopsy with set aside for culture analysis. Electronically Signed   By: JSandi MariscalM.D.   On: 07/05/2018 11:43   Dg Chest Portable 1 View  Result Date: 07/04/2018 CLINICAL DATA:  Worsening shortness of breath and cough EXAM: PORTABLE CHEST 1 VIEW COMPARISON:  06/18/2018 FINDINGS: There is bilateral diffuse reticular interstitial thickening. There is no focal consolidation. There is no pleural effusion or pneumothorax. The heart and mediastinal contours are unremarkable. The osseous structures are unremarkable. IMPRESSION: Bilateral diffuse reticular interstitial thickening which may reflect mild interstitial edema versus pneumonia including atypical pneumonia. Electronically Signed   By: HKathreen Devoid  On: 07/04/2018 12:57   Ct Bone Marrow Biopsy & Aspiration  Result Date: 07/05/2018 INDICATION: History of HIV/AIDS, now with anemia and thrombocytopenia. Please perform CT-guided bone marrow biopsy for tissue diagnostic purposes. Please obtain a sample for additional culture analysis. EXAM: CT-GUIDED  BONE MARROW BIOPSY AND ASPIRATION MEDICATIONS: None ANESTHESIA/SEDATION: Versed 2 mg IV Sedation Time: 10 Minutes; The patient was continuously monitored during the procedure by the interventional radiology nurse under my direct supervision. COMPLICATIONS: None immediate. PROCEDURE: Informed consent was obtained from the patient following an explanation of the procedure, risks, benefits and alternatives. The patient understands, agrees and consents for the procedure. All questions were addressed. A time out was performed prior to the initiation of the procedure. The patient was positioned prone and non-contrast localization CT was performed of the pelvis to demonstrate the iliac marrow spaces. The operative site was prepped and draped in the usual sterile fashion. Under sterile conditions and local anesthesia, a 22 gauge spinal needle was utilized for procedural planning. Next, an 11 gauge coaxial bone biopsy needle was advanced into the left iliac marrow space. Needle position was confirmed with CT imaging. Initially, bone marrow aspiration was performed. Next, a bone marrow biopsy was obtained with the 11 gauge outer bone marrow device. Samples were prepared with the cytotechnologist and deemed adequate. The needle was removed intact. Hemostasis was obtained with compression and a dressing was placed. The patient tolerated the procedure well without immediate post procedural complication. IMPRESSION: Successful CT guided left iliac bone marrow aspiration and core biopsy. Note, a portion of the core biopsy with set aside for culture analysis. Electronically Signed   By: Sandi Mariscal M.D.   On: 07/05/2018 11:43    ASSESSMENT AND PLAN: 1. Anemia - stable post transfusion. No overt intravascular hemolysis currently. No TTP. Could be from batrim, HIV r/o other etiologies in light of mediastinal LNadenoparthy 2. Thrombocytopenia 3. PCP , COVid19 neg 4. Sepsis secondary to #3 5. ? Sarcoidosis 6. AKI 7.  HIV/AIDS PLAN -Anemia thrombocytopenia are likely multifactorial.  She has no evidence of TTP/MAHA.  -Await results of bone marrow biopsy. -will need to monitor counts with Bactrim -Continue folic acid and B complex vitamin to support hematopoiesis. -Transfuse packed red blood cells for hemoglobin less than 7.0 or active bleeding.  She is scheduled to receive 2 units packed red blood cells today. -Transfuse platelets for platelet count less than 20,000 if active bleeding or in the setting of sepsis.  Platelet count is 19000 today.  1 unit of platelets has been ordered already. -Please do 1 hour post count after each use of platelets.   Mikey Bussing, DNP, AGPCNP-BC, AOCNP   ADDENDUM  .Patient was Personally and independently interviewed, examined and relevant elements of the history of present illness were reviewed in details and an assessment and plan was created. All elements of the patient's history of present illness , assessment and plan were discussed in details with Mikey Bussing, DNP. The above documentation reflects our combined findings assessment and plan.  Sullivan Lone MD MS

## 2018-07-08 NOTE — Progress Notes (Signed)
PROGRESS NOTE    Krystal Short  ASN:[id number] DOB: 1985-02-16 DOA: 07/04/2018 PCP: [name]    Brief Narrative:  33 year old female with history of HIV/AIDS, chronic anemia and thrombocytopenia with recent admission for suspected PCP infection, latest CD4 count 35 presented to the hospital secondary to worsening dyspnea on exertion, cough and fever as high as 103 for about a week.  Multiple COVID-19 test negative. In the emergency room, afebrile, tachycardic and tachypneic, oxygen 86% on room air, hemoglobin 5.1, platelets 14, CT scan showed diffuse bilateral infiltrates suspected PCP.  Admitted with hematology and ID consultation.  PRBC, total 4 units Platelets, total 2 units  Assessment & Plan:   Principal Problem:   Acute respiratory failure with hypoxia (HCC) Active Problems:   Tachycardia   AIDS (HCC)   Pneumocystis jiroveci pneumonia (HCC)   DOE (dyspnea on exertion)   Symptomatic anemia   Thrombocytopenia (HCC)  Acute hypoxic respiratory failure: Suspected secondary to recurrent PJP pneumonia versus sarcoidosis.  Most likely PJP. Continue on oxygen to keep saturations more than 90%. Incentive spirometry deep breathing exercises and albuterol inhaler. Patient was given a dose of vancomycin, ceftriaxone and azithromycin in the ER, infectious disease suggested to treat with Bactrim and steroid. Cultures are pending and negative so far.  Acute on chronic anemia, symptomatic anemia and thrombocytopenia: Under investigation. Suspect secondary to primary HIV disease.  No evidence of hemolysis. Initially responded to 2 units of PRBC, again dropped down to 6.  Receiving 2 more units of PRBC today.   1 unit platelets, responded from 9000-21,000 and back to 19,000 today.  1 more unit ordered today. On multivitamins. Status post bone marrow biopsy, results are pending.  Followed by hematology.  Sepsis with no endorgan dysfunction: Fairly stable.   Currently metallic stable.  Cultures are pending.  AIDS/HIV: Viral load undetectable.  CD4 count less than 35.  On Biktarvy and followed by ID.  Acute kidney injury: Improved with IV fluid administration.  DVT prophylaxis: SCDs Code Status: Full code Family Communication: None Disposition Plan: MedSurg bed.  Continues to need hospitalization.   Consultants:   Infectious disease  Hematology  Radiology  Procedures:   None  Antimicrobials:   Bactrim, 07/04/2018   Subjective: Patient seen and examined.  Afebrile overnight.  On 2 L oxygen.  Denies any nausea or vomiting. Hemoglobin back to 6.6.  Platelets 19,000.  Objective: Vitals:   07/07/18 2042 07/08/18 0702 07/08/18 0930 07/08/18 1014  BP: 103/70 (!) 85/65 104/69 105/64  Pulse: (!) 107 (!) 103 (!) 111 (!) 111  Resp: 20 20 (!) 22 (!) 24  Temp: 98.2 F (36.8 C) 98.1 F (36.7 C) 98.5 F (36.9 C) 98.1 F (36.7 C)  TempSrc: Oral Oral Oral Oral  SpO2: 99% 97% 97% 96%  Weight:      Height:        Intake/Output Summary (Last 24 hours) at 07/08/2018 1132 Last data filed at 07/08/2018 1014 Gross per 24 hour  Intake 1073 ml  Output 1150 ml  Net -77 ml   Filed Weights   07/04/18 1313 07/06/18 1100  Weight: 56.7 kg 61 kg    Examination:  General exam: Appears calm and comfortable, on 2 L oxygen. chronically sick looking . Respiratory system: Clear to auscultation. Respiratory effort normal. Cardiovascular system: S1 & S2 heard, RRR. No JVD, murmurs, rubs, gallops or clicks. No pedal edema. Gastrointestinal system: Abdomen is nondistended, soft and nontender. No organomegaly or masses felt. Normal bowel sounds heard.  Central nervous system: Alert and oriented. No focal neurological deficits. Extremities: Symmetric 5 x 5 power. Skin: No rashes, lesions or ulcers Psychiatry: Judgement and insight appear normal. Mood & affect appropriate.     Data Reviewed: I have personally reviewed following labs and imaging  studies  CBC: Recent Labs  Lab 07/04/18 1254 07/04/18 2358 07/05/18 0242 07/05/18 1621 07/07/18 0528 07/08/18 0511  WBC 6.6 9.4 9.7 9.3 6.7 6.6  NEUTROABS 4.1  --  5.5 5.2  --   --   HGB 5.1* 9.0* 8.0* 8.0* 7.2* 6.6*  HCT 16.8* 27.3* 24.8* 25.6* 23.1* 20.2*  MCV 107.0* 95.8 95.8 98.5 98.7 94.8  PLT 14* 11* 9*  9* 39* 21* 19*   Basic Metabolic Panel: Recent Labs  Lab 07/04/18 1254 07/05/18 0242  NA 128* 130*  K 4.1 4.3  CL 94* 101  CO2 23 21*  GLUCOSE 116* 132*  BUN 26* 14  CREATININE 1.13* 0.82  CALCIUM 7.4* 7.1*   GFR: Estimated Creatinine Clearance: 85.5 mL/min (by C-G formula based on SCr of 0.82 mg/dL). Liver Function Tests: Recent Labs  Lab 07/04/18 1254 07/05/18 0242  AST 39 32  ALT 12 12  ALKPHOS 153* 104  BILITOT 1.4* 0.8  PROT 7.3 6.2*  ALBUMIN 2.4* 2.0*   No results for input(s): LIPASE, AMYLASE in the last 168 hours. No results for input(s): AMMONIA in the last 168 hours. Coagulation Profile: Recent Labs  Lab 07/05/18 0242  INR 1.4*   Cardiac Enzymes: No results for input(s): CKTOTAL, CKMB, CKMBINDEX, TROPONINI in the last 168 hours. BNP (last 3 results) No results for input(s): PROBNP in the last 8760 hours. HbA1C: No results for input(s): HGBA1C in the last 72 hours. CBG: No results for input(s): GLUCAP in the last 168 hours. Lipid Profile: No results for input(s): CHOL, HDL, LDLCALC, TRIG, CHOLHDL, LDLDIRECT in the last 72 hours. Thyroid Function Tests: No results for input(s): TSH, T4TOTAL, FREET4, T3FREE, THYROIDAB in the last 72 hours. Anemia Panel: No results for input(s): VITAMINB12, FOLATE, FERRITIN, TIBC, IRON, RETICCTPCT in the last 72 hours. Sepsis Labs: Recent Labs  Lab 07/04/18 1252 07/04/18 1433  LATICACIDVEN 2.6* 2.2*    Recent Results (from the past 240 hour(s))  Blood Culture (routine x 2)     Status: None (Preliminary result)   Collection Time: 07/04/18 12:30 PM  Result Value Ref Range Status   Specimen  Description   Final    BLOOD RIGHT ANTECUBITAL Performed at [facility] [address], [place], [place] [address]    Special Requests   Final    BOTTLES DRAWN AEROBIC AND ANAEROBIC Blood Culture adequate volume Performed at [facility] [address] [place], [place] [address]    Culture   Final    NO GROWTH 4 DAYS Performed at [facility], [place] [address], [place], [place] [address]    Report Status PENDING  Incomplete  Blood Culture (routine x 2)     Status: None (Preliminary result)   Collection Time: 07/04/18 12:40 PM  Result Value Ref Range Status   Specimen Description   Final    BLOOD LEFT ANTECUBITAL Performed at [facility] [address], [place], [place] [address]    Special Requests   Final    BOTTLES DRAWN AEROBIC AND ANAEROBIC Blood Culture adequate volume Performed at [facility] [address] [place], [place] [address]    Culture   Final    NO  GROWTH 4 DAYS Performed at [facility], [place] [address], [place], [place] [address]    Report Status PENDING  Incomplete  SARS Coronavirus 2 (CEPHEID- Performed in [place] hospital lab), Hosp Order     Status: None   Collection Time: 07/04/18  1:50 PM  Result Value Ref Range Status   SARS Coronavirus 2 NEGATIVE NEGATIVE Final    Comment: (NOTE) If result is NEGATIVE SARS-CoV-2 target nucleic acids are NOT DETECTED. The SARS-CoV-2 RNA is generally detectable in upper and lower  respiratory specimens during the acute phase of infection. The lowest  concentration of SARS-CoV-2 viral copies this assay can detect is 250  copies / mL. A negative result does not preclude SARS-CoV-2 infection  and should not be used as the sole basis for treatment or other  patient management decisions.  A negative result may occur with  improper specimen collection / handling, submission of specimen other  than nasopharyngeal swab, presence  of viral mutation(s) within the  areas targeted by this assay, and inadequate number of viral copies  (<250 copies / mL). A negative result must be combined with clinical  observations, patient history, and epidemiological information. If result is POSITIVE SARS-CoV-2 target nucleic acids are DETECTED. The SARS-CoV-2 RNA is generally detectable in upper and lower  respiratory specimens dur ing the acute phase of infection.  Positive  results are indicative of active infection with SARS-CoV-2.  Clinical  correlation with patient history and other diagnostic information is  necessary to determine patient infection status.  Positive results do  not rule out bacterial infection or co-infection with other viruses. If result is PRESUMPTIVE POSTIVE SARS-CoV-2 nucleic acids MAY BE PRESENT.   A presumptive positive result was obtained on the submitted specimen  and confirmed on repeat testing.  While 2019 novel coronavirus  (SARS-CoV-2) nucleic acids may be present in the submitted sample  additional confirmatory testing may be necessary for epidemiological  and / or clinical management purposes  to differentiate between  SARS-CoV-2 and other Sarbecovirus currently known to infect humans.  If clinically indicated additional testing with an alternate test  methodology [phone]) is advised. The SARS-CoV-2 RNA is generally  detectable in upper and lower respiratory sp ecimens during the acute  phase of infection. The expected result is Negative. Fact Sheet for Patients:  StrictlyIdeas.no Fact Sheet for Healthcare Providers: BankingDealers.co.za This test is not yet approved or cleared by the [place] FDA and has been authorized for detection and/or diagnosis of SARS-CoV-2 by FDA under an Emergency Use Authorization (EUA).  This EUA will remain in effect (meaning this test can be used) for the duration of the COVID-19 declaration under Section  564(b)(1) of the Act, 21 U.S.C. section 360bbb-3(b)(1), unless the authorization is terminated or revoked sooner. Performed at [facility], [place] [address], [place], [place] [address]   MRSA PCR Screening     Status: None   Collection Time: 07/04/18  4:21 PM  Result Value Ref Range Status   MRSA by PCR NEGATIVE NEGATIVE Final    Comment:        The GeneXpert MRSA Assay (FDA approved for NASAL specimens only), is one component of a comprehensive MRSA colonization surveillance program. It is not intended to diagnose MRSA infection nor to guide or monitor treatment for MRSA infections. Performed at [facility], [place] [address] [place], [place] [address]   Aerobic/Anaerobic Culture (surgical/deep wound)     Status: None (Preliminary result)  Collection Time: 07/05/18 11:06 AM  Result Value Ref Range Status   Specimen Description   Final    BONE MARROW Performed at [facility] [address], [place], [place] [address]    Special Requests   Final    NONE Performed at [facility], [place] [address], [place], [place] [address]    Gram Stain   Final    RARE WBC PRESENT,BOTH PMN AND MONONUCLEAR NO ORGANISMS SEEN    Culture   Final    NO GROWTH 3 DAYS NO ANAEROBES ISOLATED; CULTURE IN PROGRESS FOR 5 DAYS Performed at [facility], [place] [address], [place], [place] [address]    Report Status PENDING  Incomplete  Acid Fast Smear (AFB)     Status: None   Collection Time: 07/05/18 11:06 AM  Result Value Ref Range Status   AFB Specimen Processing Comment  Final    Comment: Tissue Grinding and Digestion/Decontamination   Acid Fast Smear Negative  Final    Comment: (NOTE) Performed At: [facility] [place], [place] [id number] Rush Farmer MD GO:[id number]    Source (AFB) BONE MARROW  Final    Comment: Performed at [facility], [place] [address]  [place],  [address]         Radiology Studies: No results found.      Scheduled Meds: . sodium chloride   Intravenous Once  . sodium chloride   Intravenous Once  . sodium chloride   Intravenous Once  . sodium chloride   Intravenous Once  . B-complex with vitamin C  1 tablet Oral Daily  . bictegravir-emtricitabine-tenofovir AF  1 tablet Oral Daily  . Chlorhexidine Gluconate Cloth  6 each Topical Q0600  . folic acid  2 mg Oral Daily  . mouth rinse  15 mL Mouth Rinse BID  . predniSONE  40 mg Oral Q breakfast  . sulfamethoxazole-trimethoprim  2 tablet Oral Q8H   Continuous Infusions:    LOS: 4 days    Time spent: 25 minutes    Barb Merino, MD [facility] Pager [phone]  If 7PM-7AM, please contact night-coverage [email or web address] Password [facility] 07/08/2018, 11:32 AM

## 2018-07-08 NOTE — Progress Notes (Signed)
Patient ID: Krystal Short, female   DOB: 1985/09/18, 33 y.o.   MRN: [id number]         [facility] for Infectious Disease  Date of Admission:  07/04/2018           Day 5 trimethoprim sulfamethoxazole and prednisone ASSESSMENT: Although her CD4 count remains critically low it is extremely rare to develop opportunistic infections when there is sustained HIV viral suppression.  Between her admission in April and being readmitted on 07/04/2018 she was taking dapsone for pneumocystis prophylaxis.  Prior to that she had been on trimethoprim sulfamethoxazole.  Dapsone is unlikely to cause marrow suppression.  I suspect that she is better primarily due to the combination of steroids, supplemental oxygen and transfusions.  Hopefully her bone marrow biopsy will provide some answers as to what is causing her acute illness.  PLAN: 1. Continue Biktarvy and trimethoprim sulfamethoxazole for now 2. Await results of bone marrow biopsy  Principal Problem:   Acute respiratory failure with hypoxia (HCC) Active Problems:   Pulmonary infiltrates   Hilar adenopathy   Symptomatic anemia   Thrombocytopenia (HCC)   AIDS (acquired immune deficiency syndrome) (HCC)   ASCUS with positive high risk HPV cervical   Scheduled Meds: . sodium chloride   Intravenous Once  . sodium chloride   Intravenous Once  . sodium chloride   Intravenous Once  . sodium chloride   Intravenous Once  . B-complex with vitamin C  1 tablet Oral Daily  . bictegravir-emtricitabine-tenofovir AF  1 tablet Oral Daily  . Chlorhexidine Gluconate Cloth  6 each Topical Q0600  . folic acid  2 mg Oral Daily  . mouth rinse  15 mL Mouth Rinse BID  . predniSONE  40 mg Oral Q breakfast  . sulfamethoxazole-trimethoprim  2 tablet Oral Q8H   Continuous Infusions: PRN Meds:.HYDROcodone-acetaminophen, HYDROcodone-homatropine, ondansetron **OR** ondansetron (ZOFRAN) IV   SUBJECTIVE: She says that she is feeling about "50%" better since  admission.,  She has mild, persistent dry cough.  Her shortness of breath has improved.  She affirms that she never misses doses of her Biktarvy or dapsone.  Review of Systems: Review of Systems  Constitutional: Positive for malaise/fatigue and weight loss. Negative for chills, diaphoresis and fever.  HENT: Negative for congestion and sore throat.   Respiratory: Positive for cough and shortness of breath. Negative for sputum production and wheezing.   Cardiovascular: Negative for chest pain.  Gastrointestinal: Negative for abdominal pain, diarrhea, nausea and vomiting.  Skin: Negative for rash.    Allergies  Allergen Reactions  . Heparin     OBJECTIVE: Vitals:   07/08/18 1014 07/08/18 1300 07/08/18 1445 07/08/18 1515  BP: 105/64 102/78 99/71 101/73  Pulse: (!) 111 98 93 94  Resp: (!) 24 16 18 18   Temp: 98.1 F (36.7 C) (!) 97.5 F (36.4 C) 97.7 F (36.5 C) (!) 97.4 F (36.3 C)  TempSrc: Oral Oral Oral Oral  SpO2: 96% 98% 98% 96%  Weight:      Height:       Body mass index is 24.6 kg/m.  Physical Exam Constitutional:      Comments: She is resting quietly in bed.  Cardiovascular:     Rate and Rhythm: Normal rate and regular rhythm.     Heart sounds: No murmur.  Pulmonary:     Effort: Pulmonary effort is normal.     Breath sounds: No wheezing, rhonchi or rales.  Lymphadenopathy:     Head:     Right  side of head: No submandibular adenopathy.     Left side of head: No submandibular adenopathy.     Cervical: No cervical adenopathy.     Upper Body:     Right upper body: No axillary adenopathy.     Left upper body: No axillary adenopathy.  Psychiatric:        Mood and Affect: Mood normal.     Lab Results Lab Results  Component Value Date   WBC 6.6 07/08/2018   HGB 9.2 (L) 07/08/2018   HCT 28.2 (L) 07/08/2018   MCV 94.8 07/08/2018   PLT 19 (LL) 07/08/2018    Lab Results  Component Value Date   CREATININE 0.82 07/05/2018   BUN 14 07/05/2018   NA 130 (L)  07/05/2018   K 4.3 07/05/2018   CL 101 07/05/2018   CO2 21 (L) 07/05/2018    Lab Results  Component Value Date   ALT 12 07/05/2018   AST 32 07/05/2018   ALKPHOS 104 07/05/2018   BILITOT 0.8 07/05/2018    HIV 1 RNA Quant (copies/mL)  Date Value  06/20/2018 <20 DETECTED (A)  04/02/2018 <20 DETECTED (A)  12/17/2017 26 (H)   CD4 T Cell Abs (/uL)  Date Value  06/20/2018 <35 (L)  04/02/2018 10 (L)  12/17/2017 50 (L)   Microbiology: Recent Results (from the past 240 hour(s))  Blood Culture (routine x 2)     Status: None (Preliminary result)   Collection Time: 07/04/18 12:30 PM  Result Value Ref Range Status   Specimen Description   Final    BLOOD RIGHT ANTECUBITAL Performed at [facility], [place] [address], [place], [place] [address]    Special Requests   Final    BOTTLES DRAWN AEROBIC AND ANAEROBIC Blood Culture adequate volume Performed at [facility] [address], [place], [place] [address]    Culture   Final    NO GROWTH 4 DAYS Performed at [facility], [place] [address], [place], [place] [address]    Report Status PENDING  Incomplete  Blood Culture (routine x 2)     Status: None (Preliminary result)   Collection Time: 07/04/18 12:40 PM  Result Value Ref Range Status   Specimen Description   Final    BLOOD LEFT ANTECUBITAL Performed at [facility] [address], [place], [place] [address]    Special Requests   Final    BOTTLES DRAWN AEROBIC AND ANAEROBIC Blood Culture adequate volume Performed at [facility] [address] [place], [place] [address]    Culture   Final    NO GROWTH 4 DAYS Performed at [facility], [place] [address], [place], [place] [address]    Report Status PENDING  Incomplete  SARS Coronavirus 2 (CEPHEID- Performed in [place] hospital lab), Hosp Order     Status: None   Collection Time: 07/04/18  1:50 PM  Result Value Ref Range Status   SARS  Coronavirus 2 NEGATIVE NEGATIVE Final    Comment: (NOTE) If result is NEGATIVE SARS-CoV-2 target nucleic acids are NOT DETECTED. The SARS-CoV-2 RNA is generally detectable in upper and lower  respiratory specimens during the acute phase of infection. The lowest  concentration of SARS-CoV-2 viral copies this assay can detect is 250  copies / mL. A negative result does not preclude SARS-CoV-2 infection  and should not be used as the sole basis for treatment or other  patient management decisions.  A negative result  may occur with  improper specimen collection / handling, submission of specimen other  than nasopharyngeal swab, presence of viral mutation(s) within the  areas targeted by this assay, and inadequate number of viral copies  (<250 copies / mL). A negative result must be combined with clinical  observations, patient history, and epidemiological information. If result is POSITIVE SARS-CoV-2 target nucleic acids are DETECTED. The SARS-CoV-2 RNA is generally detectable in upper and lower  respiratory specimens dur ing the acute phase of infection.  Positive  results are indicative of active infection with SARS-CoV-2.  Clinical  correlation with patient history and other diagnostic information is  necessary to determine patient infection status.  Positive results do  not rule out bacterial infection or co-infection with other viruses. If result is PRESUMPTIVE POSTIVE SARS-CoV-2 nucleic acids MAY BE PRESENT.   A presumptive positive result was obtained on the submitted specimen  and confirmed on repeat testing.  While 2019 novel coronavirus  (SARS-CoV-2) nucleic acids may be present in the submitted sample  additional confirmatory testing may be necessary for epidemiological  and / or clinical management purposes  to differentiate between  SARS-CoV-2 and other Sarbecovirus currently known to infect humans.  If clinically indicated additional testing with an alternate test   methodology [phone]) is advised. The SARS-CoV-2 RNA is generally  detectable in upper and lower respiratory sp ecimens during the acute  phase of infection. The expected result is Negative. Fact Sheet for Patients:  StrictlyIdeas.no Fact Sheet for Healthcare Providers: BankingDealers.co.za This test is not yet approved or cleared by the [place] FDA and has been authorized for detection and/or diagnosis of SARS-CoV-2 by FDA under an Emergency Use Authorization (EUA).  This EUA will remain in effect (meaning this test can be used) for the duration of the COVID-19 declaration under Section 564(b)(1) of the Act, 21 U.S.C. section 360bbb-3(b)(1), unless the authorization is terminated or revoked sooner. Performed at [facility], [place] [address], [place], [place] [address]   MRSA PCR Screening     Status: None   Collection Time: 07/04/18  4:21 PM  Result Value Ref Range Status   MRSA by PCR NEGATIVE NEGATIVE Final    Comment:        The GeneXpert MRSA Assay (FDA approved for NASAL specimens only), is one component of a comprehensive MRSA colonization surveillance program. It is not intended to diagnose MRSA infection nor to guide or monitor treatment for MRSA infections. Performed at [facility], [place] [address] [place], [place] [address]   Aerobic/Anaerobic Culture (surgical/deep wound)     Status: None (Preliminary result)   Collection Time: 07/05/18 11:06 AM  Result Value Ref Range Status   Specimen Description   Final    BONE MARROW Performed at [facility] [address] [place], [place] [address]    Special Requests   Final    NONE Performed at [facility], [place] [address], [place], [place] [address]    Gram Stain   Final    RARE WBC PRESENT,BOTH PMN AND MONONUCLEAR NO ORGANISMS SEEN    Culture   Final    NO GROWTH 3 DAYS NO ANAEROBES  ISOLATED; CULTURE IN PROGRESS FOR 5 DAYS Performed at [facility], [place] [address] [place], [place] [address]    Report Status PENDING  Incomplete  Acid Fast Smear (AFB)     Status: None   Collection Time: 07/05/18 11:06 AM  Result Value Ref Range  Status   AFB Specimen Processing Comment  Final    Comment: Tissue Grinding and Digestion/Decontamination   Acid Fast Smear Negative  Final    Comment: (NOTE) Performed At: [facility] [place], [place] [id number] Rush Farmer MD WI:[id number]    Source (AFB) BONE MARROW  Final    Comment: Performed at [facility] [address], [place], [place] [address]    Michel Bickers, [place] [facility] [phone] pager   [phone] cell 07/08/2018, 3:26 PM

## 2018-07-09 ENCOUNTER — Ambulatory Visit: Payer: 59 | Admitting: Internal Medicine

## 2018-07-09 DIAGNOSIS — Z21 Asymptomatic human immunodeficiency virus [HIV] infection status: Secondary | ICD-10-CM

## 2018-07-09 LAB — CULTURE, BLOOD (ROUTINE X 2)
Culture: NO GROWTH
Culture: NO GROWTH
Special Requests: ADEQUATE
Special Requests: ADEQUATE

## 2018-07-09 LAB — CBC
HCT: 34.3 % — ABNORMAL LOW (ref 36.0–46.0)
Hemoglobin: 11.4 g/dL — ABNORMAL LOW (ref 12.0–15.0)
MCH: 31.3 pg (ref 26.0–34.0)
MCHC: 33.2 g/dL (ref 30.0–36.0)
MCV: 94.2 fL (ref 80.0–100.0)
Platelets: 25 10*3/uL — CL (ref 150–400)
RBC: 3.64 MIL/uL — ABNORMAL LOW (ref 3.87–5.11)
RDW: 19.7 % — ABNORMAL HIGH (ref 11.5–15.5)
WBC: 9.1 10*3/uL (ref 4.0–10.5)
nRBC: 1.2 % — ABNORMAL HIGH (ref 0.0–0.2)

## 2018-07-09 LAB — TYPE AND SCREEN
ABO/RH(D): O POS
Antibody Screen: NEGATIVE
Unit division: 0
Unit division: 0

## 2018-07-09 LAB — BPAM RBC
Blood Product Expiration Date: 202006252359
Blood Product Expiration Date: 202006252359
ISSUE DATE / TIME: 202006010924
ISSUE DATE / TIME: 202006011748
Unit Type and Rh: 5100
Unit Type and Rh: 5100

## 2018-07-09 LAB — BASIC METABOLIC PANEL
Anion gap: 6 (ref 5–15)
BUN: 11 mg/dL (ref 6–20)
CO2: 17 mmol/L — ABNORMAL LOW (ref 22–32)
Calcium: 7.9 mg/dL — ABNORMAL LOW (ref 8.9–10.3)
Chloride: 108 mmol/L (ref 98–111)
Creatinine, Ser: 0.8 mg/dL (ref 0.44–1.00)
GFR calc Af Amer: >60 mL/min (ref >60)
GFR calc non Af Amer: >60 mL/min (ref >60)
Glucose, Bld: 102 mg/dL — ABNORMAL HIGH (ref 70–99)
Potassium: 5.1 mmol/L (ref 3.5–5.1)
Sodium: 131 mmol/L — ABNORMAL LOW (ref 135–145)

## 2018-07-09 LAB — BPAM PLATELET PHERESIS
Blood Product Expiration Date: 202006032359
ISSUE DATE / TIME: 202006011445
Unit Type and Rh: 5100

## 2018-07-09 LAB — PREPARE PLATELET PHERESIS: Unit division: 0

## 2018-07-09 NOTE — Progress Notes (Signed)
PROGRESS NOTE    Krystal Short  LHT:[id number] DOB: 1985-10-16 DOA: 07/04/2018 PCP: [name]    Brief Narrative:  33 year old female with history of HIV/AIDS, chronic anemia and thrombocytopenia with recent admission for suspected PCP infection, latest CD4 count 35 , undetectable viral load presented to the hospital secondary to worsening dyspnea on exertion, cough and fever as high as 103 for about a week.  Multiple COVID-19 test negative. In the emergency room, afebrile, tachycardic and tachypneic, oxygen 86% on room air, hemoglobin 5.1, platelets 14, CT scan showed diffuse bilateral infiltrates suspected PCP.  Admitted with hematology and ID consultation.  PRBC, total 4 units--now appropriately responded Platelets, total 2 units-fluctuating levels. Bone marrow biopsy, 07/06/2018 and results are pending.  Negative for hemolysis.  Assessment & Plan:   Principal Problem:   Acute respiratory failure with hypoxia (HCC) Active Problems:   AIDS (acquired immune deficiency syndrome) (HCC)   Symptomatic anemia   Thrombocytopenia (HCC)   ASCUS with positive high risk HPV cervical   Pulmonary infiltrates   Hilar adenopathy  Acute hypoxic respiratory failure:  Suspected secondary to recurrent PJP pneumonia versus sarcoidosis.  Rapid response with steroids every time, leaning more towards sarcoidosis.  Oxygenation improved.   Incentive spirometry deep breathing exercises and albuterol inhaler. Patient was given a dose of vancomycin, ceftriaxone and azithromycin in the ER,  Followed by infectious disease currently remains on Bactrim and steroids.  All cultures are negative so far.    Acute on chronic anemia, symptomatic anemia and thrombocytopenia: Under investigation. Suspect secondary to primary HIV disease. No evidence of hemolysis. PRBC fluctuating, total 4 units transfusion and hemoglobin is adequate today.   Platelets fluctuate, total 2 units transfusion and  is stable today.   On multivitamins. Status post bone marrow biopsy, results are pending.  Followed by hematology.  Sepsis with no endorgan dysfunction: Fairly stable.    AIDS/HIV: Viral load undetectable.  CD4 count less than 35.  On Biktarvy and followed by ID.  Acute kidney injury: Improved with IV fluid administration.  DVT prophylaxis: SCDs Code Status: Full code Family Communication: None Disposition Plan: MedSurg bed.  Continues to need hospitalization.   Consultants:   Infectious disease  Hematology  Radiology  Procedures:   None  Antimicrobials:   Bactrim, 07/04/2018   Subjective: Patient seen and examined.  Afebrile overnight.  On room air now. Denies any nausea or vomiting. Hemoglobin jumped from 6.6-11.4, 5 unit response to 2 units of PRBC. Platelets 19,000-25,000 with 1 unit of platelets. Patient herself feels less short of breath on ambulation and was able to walk around with no need for oxygen.  Objective: Vitals:   07/08/18 1757 07/08/18 1814 07/08/18 2034 07/09/18 0505  BP: 99/63 99/65 106/79 96/65  Pulse: (!) 102 97 95 87  Resp: 16 18 18 18   Temp: 97.8 F (36.6 C) (!) 97.3 F (36.3 C) 97.7 F (36.5 C) 97.7 F (36.5 C)  TempSrc: Oral Oral Oral Oral  SpO2: 96% 98% 98% 98%  Weight:      Height:        Intake/Output Summary (Last 24 hours) at 07/09/2018 1248 Last data filed at 07/09/2018 0958 Gross per 24 hour  Intake 2120.07 ml  Output 1900 ml  Net 220.07 ml   Filed Weights   07/04/18 1313 07/06/18 1100  Weight: 56.7 kg 61 kg    Examination:  General exam: Appears calm and comfortable, on room air today. chronically sick looking . Respiratory system: Clear to  auscultation. Respiratory effort normal. Cardiovascular system: S1 & S2 heard, RRR. No JVD, murmurs, rubs, gallops or clicks. No pedal edema. Gastrointestinal system: Abdomen is nondistended, soft and nontender. No organomegaly or masses felt. Normal bowel sounds heard.  Central nervous system: Alert and oriented. No focal neurological deficits. Extremities: Symmetric 5 x 5 power. Skin: No rashes, lesions or ulcers Psychiatry: Judgement and insight appear normal. Mood & affect appropriate.     Data Reviewed: I have personally reviewed following labs and imaging studies  CBC: Recent Labs  Lab 07/04/18 1254  07/05/18 0242 07/05/18 1621 07/07/18 0528 07/08/18 0511 07/08/18 1458 07/09/18 0656  WBC 6.6   < > 9.7 9.3 6.7 6.6  --  9.1  NEUTROABS 4.1  --  5.5 5.2  --   --   --   --   HGB 5.1*   < > 8.0* 8.0* 7.2* 6.6* 9.2* 11.4*  HCT 16.8*   < > 24.8* 25.6* 23.1* 20.2* 28.2* 34.3*  MCV 107.0*   < > 95.8 98.5 98.7 94.8  --  94.2  PLT 14*   < > 9*  9* 39* 21* 19*  --  25*   < > = values in this interval not displayed.   Basic Metabolic Panel: Recent Labs  Lab 07/04/18 1254 07/05/18 0242 07/09/18 0656  NA 128* 130* 131*  K 4.1 4.3 5.1  CL 94* 101 108  CO2 23 21* 17*  GLUCOSE 116* 132* 102*  BUN 26* 14 11  CREATININE 1.13* 0.82 0.80  CALCIUM 7.4* 7.1* 7.9*   GFR: Estimated Creatinine Clearance: 87.7 mL/min (by C-G formula based on SCr of 0.8 mg/dL). Liver Function Tests: Recent Labs  Lab 07/04/18 1254 07/05/18 0242  AST 39 32  ALT 12 12  ALKPHOS 153* 104  BILITOT 1.4* 0.8  PROT 7.3 6.2*  ALBUMIN 2.4* 2.0*   No results for input(s): LIPASE, AMYLASE in the last 168 hours. No results for input(s): AMMONIA in the last 168 hours. Coagulation Profile: Recent Labs  Lab 07/05/18 0242  INR 1.4*   Cardiac Enzymes: No results for input(s): CKTOTAL, CKMB, CKMBINDEX, TROPONINI in the last 168 hours. BNP (last 3 results) No results for input(s): PROBNP in the last 8760 hours. HbA1C: No results for input(s): HGBA1C in the last 72 hours. CBG: No results for input(s): GLUCAP in the last 168 hours. Lipid Profile: No results for input(s): CHOL, HDL, LDLCALC, TRIG, CHOLHDL, LDLDIRECT in the last 72 hours. Thyroid Function Tests: No  results for input(s): TSH, T4TOTAL, FREET4, T3FREE, THYROIDAB in the last 72 hours. Anemia Panel: No results for input(s): VITAMINB12, FOLATE, FERRITIN, TIBC, IRON, RETICCTPCT in the last 72 hours. Sepsis Labs: Recent Labs  Lab 07/04/18 1252 07/04/18 1433  LATICACIDVEN 2.6* 2.2*    Recent Results (from the past 240 hour(s))  Blood Culture (routine x 2)     Status: None   Collection Time: 07/04/18 12:30 PM  Result Value Ref Range Status   Specimen Description   Final    BLOOD RIGHT ANTECUBITAL Performed at [facility] [place] [address] [place], [place] [address]    Special Requests   Final    BOTTLES DRAWN AEROBIC AND ANAEROBIC Blood Culture adequate volume Performed at [facility] [address], [place], [place] [address]    Culture   Final    NO GROWTH 5 DAYS Performed at [facility], [place] [address], [place], [place] [address]    Report Status 07/09/2018 FINAL  Final  Blood Culture (routine x 2)     Status: None   Collection Time: 07/04/18 12:40 PM  Result Value Ref Range Status   Specimen Description   Final    BLOOD LEFT ANTECUBITAL Performed at [facility] [address], [place], [place] [address]    Special Requests   Final    BOTTLES DRAWN AEROBIC AND ANAEROBIC Blood Culture adequate volume Performed at [facility] [address] [place], [place] [address]    Culture   Final    NO GROWTH 5 DAYS Performed at [facility], [place] [address] [place], [place] [address]    Report Status 07/09/2018 FINAL  Final  SARS Coronavirus 2 (CEPHEID- Performed in [facility] hospital lab), Hosp Order     Status: None   Collection Time: 07/04/18  1:50 PM  Result Value Ref Range Status   SARS Coronavirus 2 NEGATIVE NEGATIVE Final    Comment: (NOTE) If result is NEGATIVE SARS-CoV-2 target nucleic acids are NOT DETECTED. The SARS-CoV-2 RNA is generally detectable in upper and lower   respiratory specimens during the acute phase of infection. The lowest  concentration of SARS-CoV-2 viral copies this assay can detect is 250  copies / mL. A negative result does not preclude SARS-CoV-2 infection  and should not be used as the sole basis for treatment or other  patient management decisions.  A negative result may occur with  improper specimen collection / handling, submission of specimen other  than nasopharyngeal swab, presence of viral mutation(s) within the  areas targeted by this assay, and inadequate number of viral copies  (<250 copies / mL). A negative result must be combined with clinical  observations, patient history, and epidemiological information. If result is POSITIVE SARS-CoV-2 target nucleic acids are DETECTED. The SARS-CoV-2 RNA is generally detectable in upper and lower  respiratory specimens dur ing the acute phase of infection.  Positive  results are indicative of active infection with SARS-CoV-2.  Clinical  correlation with patient history and other diagnostic information is  necessary to determine patient infection status.  Positive results do  not rule out bacterial infection or co-infection with other viruses. If result is PRESUMPTIVE POSTIVE SARS-CoV-2 nucleic acids MAY BE PRESENT.   A presumptive positive result was obtained on the submitted specimen  and confirmed on repeat testing.  While 2019 novel coronavirus  (SARS-CoV-2) nucleic acids may be present in the submitted sample  additional confirmatory testing may be necessary for epidemiological  and / or clinical management purposes  to differentiate between  SARS-CoV-2 and other Sarbecovirus currently known to infect humans.  If clinically indicated additional testing with an alternate test  methodology [phone]) is advised. The SARS-CoV-2 RNA is generally  detectable in upper and lower respiratory sp ecimens during the acute  phase of infection. The expected result is Negative. Fact  Sheet for Patients:  StrictlyIdeas.no Fact Sheet for Healthcare Providers: BankingDealers.co.za This test is not yet approved or cleared by the [place] FDA and has been authorized for detection and/or diagnosis of SARS-CoV-2 by FDA under an Emergency Use Authorization (EUA).  This EUA will remain in effect (meaning this test can be used) for the duration of the COVID-19 declaration under Section 564(b)(1) of the Act, 21 U.S.C. section 360bbb-3(b)(1), unless the authorization is terminated or revoked sooner. Performed at [facility], [place] [address] [place], [place] [address]   MRSA PCR Screening     Status: None  Collection Time: 07/04/18  4:21 PM  Result Value Ref Range Status   MRSA by PCR NEGATIVE NEGATIVE Final    Comment:        The GeneXpert MRSA Assay (FDA approved for NASAL specimens only), is one component of a comprehensive MRSA colonization surveillance program. It is not intended to diagnose MRSA infection nor to guide or monitor treatment for MRSA infections. Performed at [facility], [place] [facility] [place] [address], [place], [place] [address]   Aerobic/Anaerobic Culture (surgical/deep wound)     Status: None (Preliminary result)   Collection Time: 07/05/18 11:06 AM  Result Value Ref Range Status   Specimen Description   Final    BONE MARROW Performed at [facility] [address], [place], [place] [address]    Special Requests   Final    NONE Performed at [facility], [place] [address], [place], [place] [address]    Gram Stain   Final    RARE WBC PRESENT,BOTH PMN AND MONONUCLEAR NO ORGANISMS SEEN    Culture   Final    NO GROWTH 4 DAYS NO ANAEROBES ISOLATED; CULTURE IN PROGRESS FOR 5 DAYS Performed at [facility], [place] [address] [place], [place] [address]    Report Status PENDING  Incomplete  Acid Fast Smear (AFB)     Status: None    Collection Time: 07/05/18 11:06 AM  Result Value Ref Range Status   AFB Specimen Processing Comment  Final    Comment: Tissue Grinding and Digestion/Decontamination   Acid Fast Smear Negative  Final    Comment: (NOTE) Performed At: [facility] [place], [place] [id number] Rush Farmer MD LX:[id number]    Source (AFB) BONE MARROW  Final    Comment: Performed at [facility], [place] [address], [place],  [address]  Fungus Culture With Stain     Status: None (Preliminary result)   Collection Time: 07/05/18 11:06 AM  Result Value Ref Range Status   Fungus Stain Final report  Final    Comment: (NOTE) Performed At: [facility] [place], [place] [id number] Rush Farmer MD OZ:[id number]    Fungus (Mycology) Culture PENDING  Incomplete   Fungal Source BONE MARROW  Final    Comment: Performed at [facility], [place] [address] [place],  [address]  Fungus Culture Result     Status: None   Collection Time: 07/05/18 11:06 AM  Result Value Ref Range Status   Result 1 Comment  Final    Comment: (NOTE) KOH/Calcofluor preparation:  no fungus observed. Performed At: [facility] [place], [place] [id number] Rush Farmer MD UE:[id number]          Radiology Studies: No results found.      Scheduled Meds: . sodium chloride   Intravenous Once  . sodium chloride   Intravenous Once  . sodium chloride   Intravenous Once  . sodium chloride   Intravenous Once  . B-complex with vitamin C  1 tablet Oral Daily  . bictegravir-emtricitabine-tenofovir AF  1 tablet Oral Daily  . Chlorhexidine Gluconate Cloth  6 each Topical Q0600  . folic acid  2 mg Oral Daily  . mouth rinse  15 mL Mouth Rinse BID  . predniSONE  40 mg Oral Q breakfast  . sulfamethoxazole-trimethoprim  2 tablet Oral Q8H   Continuous Infusions:    LOS: 5 days    Time spent: 25 minutes    Dante Gang  Ashley Montminy, MD [facility] Pager [phone]  If 7PM-7AM, please contact night-coverage [email or web address] Password [facility] 07/09/2018, 12:48 PM

## 2018-07-09 NOTE — Progress Notes (Signed)
Patient ID: Krystal Short, female   DOB: Dec 28, 1985, 33 y.o.   MRN: [id number]         [facility] for Infectious Disease  Date of Admission:  07/04/2018           Day 6 trimethoprim sulfamethoxazole and prednisone ASSESSMENT: She has had fairly prompt improvement, most likely due to blood transfusion and steroids.  I doubt that this is opportunistic infection secondary to her HIV infection.  Hopefully, the bone marrow biopsy will provide clues as to what has caused her acute illness.  PLAN: 1. Continue Biktarvy and trimethoprim sulfamethoxazole for now 2. Await results of bone marrow biopsy  Principal Problem:   Acute respiratory failure with hypoxia (HCC) Active Problems:   Pulmonary infiltrates   Hilar adenopathy   Symptomatic anemia   Thrombocytopenia (HCC)   AIDS (acquired immune deficiency syndrome) (HCC)   ASCUS with positive high risk HPV cervical   Scheduled Meds: . sodium chloride   Intravenous Once  . sodium chloride   Intravenous Once  . sodium chloride   Intravenous Once  . sodium chloride   Intravenous Once  . B-complex with vitamin C  1 tablet Oral Daily  . bictegravir-emtricitabine-tenofovir AF  1 tablet Oral Daily  . Chlorhexidine Gluconate Cloth  6 each Topical Q0600  . folic acid  2 mg Oral Daily  . mouth rinse  15 mL Mouth Rinse BID  . predniSONE  40 mg Oral Q breakfast  . sulfamethoxazole-trimethoprim  2 tablet Oral Q8H   Continuous Infusions: PRN Meds:.alum & mag hydroxide-simeth, HYDROcodone-acetaminophen, HYDROcodone-homatropine, ondansetron **OR** ondansetron (ZOFRAN) IV   SUBJECTIVE: She is feeling much better today.  She has been up walking in her room today without supplemental oxygen and has no more shortness of breath.  She has only mild and occasional dry cough.  Review of Systems: Review of Systems  Constitutional: Positive for malaise/fatigue and weight loss. Negative for chills, diaphoresis and fever.  HENT: Negative for  congestion and sore throat.   Respiratory: Positive for cough. Negative for sputum production, shortness of breath and wheezing.   Cardiovascular: Negative for chest pain.  Gastrointestinal: Negative for abdominal pain, diarrhea, nausea and vomiting.  Skin: Negative for rash.    Allergies  Allergen Reactions  . Heparin     OBJECTIVE: Vitals:   07/08/18 1757 07/08/18 1814 07/08/18 2034 07/09/18 0505  BP: 99/63 99/65 106/79 96/65  Pulse: (!) 102 97 95 87  Resp: 16 18 18 18   Temp: 97.8 F (36.6 C) (!) 97.3 F (36.3 C) 97.7 F (36.5 C) 97.7 F (36.5 C)  TempSrc: Oral Oral Oral Oral  SpO2: 96% 98% 98% 98%  Weight:      Height:       Body mass index is 24.6 kg/m.  Physical Exam Constitutional:      Comments: She is smiling and in good spirits.  Cardiovascular:     Rate and Rhythm: Normal rate and regular rhythm.     Heart sounds: No murmur.  Pulmonary:     Effort: Pulmonary effort is normal.     Breath sounds: No wheezing, rhonchi or rales.  Lymphadenopathy:     Head:     Right side of head: No submandibular adenopathy.     Left side of head: No submandibular adenopathy.     Cervical: No cervical adenopathy.     Upper Body:     Right upper body: No axillary adenopathy.     Left upper body: No axillary adenopathy.  Psychiatric:        Mood and Affect: Mood normal.     Lab Results Lab Results  Component Value Date   WBC 9.1 07/09/2018   HGB 11.4 (L) 07/09/2018   HCT 34.3 (L) 07/09/2018   MCV 94.2 07/09/2018   PLT 25 (LL) 07/09/2018    Lab Results  Component Value Date   CREATININE 0.80 07/09/2018   BUN 11 07/09/2018   NA 131 (L) 07/09/2018   K 5.1 07/09/2018   CL 108 07/09/2018   CO2 17 (L) 07/09/2018    Lab Results  Component Value Date   ALT 12 07/05/2018   AST 32 07/05/2018   ALKPHOS 104 07/05/2018   BILITOT 0.8 07/05/2018    HIV 1 RNA Quant (copies/mL)  Date Value  06/20/2018 <20 DETECTED (A)  04/02/2018 <20 DETECTED (A)  12/17/2017 26 (H)    CD4 T Cell Abs (/uL)  Date Value  06/20/2018 <35 (L)  04/02/2018 10 (L)  12/17/2017 50 (L)   Microbiology: Recent Results (from the past 240 hour(s))  Blood Culture (routine x 2)     Status: None   Collection Time: 07/04/18 12:30 PM  Result Value Ref Range Status   Specimen Description   Final    BLOOD RIGHT ANTECUBITAL Performed at [facility], [place] [address] [place], [place] [address]    Special Requests   Final    BOTTLES DRAWN AEROBIC AND ANAEROBIC Blood Culture adequate volume Performed at [place] [address], [place], [place] [address]    Culture   Final    NO GROWTH 5 DAYS Performed at [facility], [place] [address] [place], [place] [address]    Report Status 07/09/2018 FINAL  Final  Blood Culture (routine x 2)     Status: None   Collection Time: 07/04/18 12:40 PM  Result Value Ref Range Status   Specimen Description   Final    BLOOD LEFT ANTECUBITAL Performed at [place] [address], [place], [place] [address]    Special Requests   Final    BOTTLES DRAWN AEROBIC AND ANAEROBIC Blood Culture adequate volume Performed at [facility] [address], [place], [place] [address]    Culture   Final    NO GROWTH 5 DAYS Performed at [facility], [place] [address] [place], [place] [address]    Report Status 07/09/2018 FINAL  Final  SARS Coronavirus 2 (CEPHEID- Performed in [place] hospital lab), Hosp Order     Status: None   Collection Time: 07/04/18  1:50 PM  Result Value Ref Range Status   SARS Coronavirus 2 NEGATIVE NEGATIVE Final    Comment: (NOTE) If result is NEGATIVE SARS-CoV-2 target nucleic acids are NOT DETECTED. The SARS-CoV-2 RNA is generally detectable in upper and lower  respiratory specimens during the acute phase of infection. The lowest  concentration of SARS-CoV-2 viral copies this assay can detect is 250  copies / mL. A negative result  does not preclude SARS-CoV-2 infection  and should not be used as the sole basis for treatment or other  patient management decisions.  A negative result may occur with  improper specimen collection / handling, submission of specimen other  than nasopharyngeal swab, presence of viral mutation(s) within the  areas targeted by this assay, and inadequate number of viral copies  (<250 copies / mL). A negative result must be combined with clinical  observations, patient history, and epidemiological  information. If result is POSITIVE SARS-CoV-2 target nucleic acids are DETECTED. The SARS-CoV-2 RNA is generally detectable in upper and lower  respiratory specimens dur ing the acute phase of infection.  Positive  results are indicative of active infection with SARS-CoV-2.  Clinical  correlation with patient history and other diagnostic information is  necessary to determine patient infection status.  Positive results do  not rule out bacterial infection or co-infection with other viruses. If result is PRESUMPTIVE POSTIVE SARS-CoV-2 nucleic acids MAY BE PRESENT.   A presumptive positive result was obtained on the submitted specimen  and confirmed on repeat testing.  While 2019 novel coronavirus  (SARS-CoV-2) nucleic acids may be present in the submitted sample  additional confirmatory testing may be necessary for epidemiological  and / or clinical management purposes  to differentiate between  SARS-CoV-2 and other Sarbecovirus currently known to infect humans.  If clinically indicated additional testing with an alternate test  methodology [phone]) is advised. The SARS-CoV-2 RNA is generally  detectable in upper and lower respiratory sp ecimens during the acute  phase of infection. The expected result is Negative. Fact Sheet for Patients:  StrictlyIdeas.no Fact Sheet for Healthcare Providers: BankingDealers.co.za This test is not yet approved or  cleared by the [place] FDA and has been authorized for detection and/or diagnosis of SARS-CoV-2 by FDA under an Emergency Use Authorization (EUA).  This EUA will remain in effect (meaning this test can be used) for the duration of the COVID-19 declaration under Section 564(b)(1) of the Act, 21 U.S.C. section 360bbb-3(b)(1), unless the authorization is terminated or revoked sooner. Performed at [facility], [place] [address] [place], [place] [address]   MRSA PCR Screening     Status: None   Collection Time: 07/04/18  4:21 PM  Result Value Ref Range Status   MRSA by PCR NEGATIVE NEGATIVE Final    Comment:        The GeneXpert MRSA Assay (FDA approved for NASAL specimens only), is one component of a comprehensive MRSA colonization surveillance program. It is not intended to diagnose MRSA infection nor to guide or monitor treatment for MRSA infections. Performed at [facility], [place] [address] [place], [place] [address]   Aerobic/Anaerobic Culture (surgical/deep wound)     Status: None (Preliminary result)   Collection Time: 07/05/18 11:06 AM  Result Value Ref Range Status   Specimen Description   Final    BONE MARROW Performed at [facility] [address] [place], [place] [address]    Special Requests   Final    NONE Performed at [facility], [place] [address], [place], [place] [address]    Gram Stain   Final    RARE WBC PRESENT,BOTH PMN AND MONONUCLEAR NO ORGANISMS SEEN    Culture   Final    NO GROWTH 3 DAYS NO ANAEROBES ISOLATED; CULTURE IN PROGRESS FOR 5 DAYS Performed at [facility], [place] [address], [place], [place] [address]    Report Status PENDING  Incomplete  Acid Fast Smear (AFB)     Status: None   Collection Time: 07/05/18 11:06 AM  Result Value Ref Range Status   AFB Specimen Processing Comment  Final    Comment: Tissue Grinding and Digestion/Decontamination   Acid  Fast Smear Negative  Final    Comment: (NOTE) Performed At: [facility] [place], [place] [id number] Rush Farmer MD [id number]    Source (AFB) BONE MARROW  Final  Comment: Performed at [facility], [place] [address] [place], [place] [address]  Fungus Culture With Stain     Status: None (Preliminary result)   Collection Time: 07/05/18 11:06 AM  Result Value Ref Range Status   Fungus Stain Final report  Final    Comment: (NOTE) Performed At: [facility] [place], [place] [id number] Rush Farmer MD [id number]    Fungus (Mycology) Culture PENDING  Incomplete   Fungal Source BONE MARROW  Final    Comment: Performed at [facility], [place] [address] [place], [place] [address]  Fungus Culture Result     Status: None   Collection Time: 07/05/18 11:06 AM  Result Value Ref Range Status   Result 1 Comment  Final    Comment: (NOTE) KOH/Calcofluor preparation:  no fungus observed. Performed At: [facility] [address] [place], [place] [id number] Rush Farmer MD [id number]     Michel Bickers, [place] for [place] [phone] pager   [phone] cell 07/09/2018, 10:15 AM

## 2018-07-09 NOTE — Progress Notes (Addendum)
HEMATOLOGY-ONCOLOGY PROGRESS NOTE  SUBJECTIVE:   Breathing improved. No longer on oxygen. Denies bleeding. Remains afebrile. Has no other new complaints this morning.   REVIEW OF SYSTEMS:   Constitutional: Denies fevers and chills. Eyes: Denies blurriness of vision Ears, nose, mouth, throat, and face: Denies mucositis or sore throat Respiratory: Cough and shortness of breath have resolved.  Cardiovascular: Denies palpitations, chest discomfort Gastrointestinal:  Denies nausea, heartburn or change in bowel habits Skin: Denies abnormal skin rashes Lymphatics: Denies new lymphadenopathy or easy bruising Neurological:Denies numbness, tingling or new weaknesses Behavioral/Psych: Mood is stable, no new changes  Extremities: No lower extremity edema All other systems were reviewed with the patient and are negative.  I have reviewed the past medical history, past surgical history, social history and family history with the patient and they are unchanged from previous note.   PHYSICAL EXAMINATION:  Vitals:   07/08/18 2034 07/09/18 0505  BP: 106/79 96/65  Pulse: 95 87  Resp: 18 18  Temp: 97.7 F (36.5 C) 97.7 F (36.5 C)  SpO2: 98% 98%   Filed Weights   07/04/18 1313 07/06/18 1100  Weight: 125 lb (56.7 kg) 134 lb 7.7 oz (61 kg)   . GENERAL:alert, in no acute distress and comfortable SKIN: no acute rashes, no significant lesions EYES: perl OROPHARYNX: MMM, no exudates, no oropharyngeal erythema or ulceration NECK: supple, no JVD LYMPH:  no palpable lymphadenopathy in the cervical, axillary or inguinal regions LUNGS: few basal rales on rhonci HEART: regular rate & rhythm ABDOMEN:  normoactive bowel sounds , non tender, not distended. Extremity: no pedal edema PSYCH: alert & oriented x 3 with fluent speech NEURO: no focal motor/sensory deficits   LABORATORY DATA:  I have reviewed the data as listed CMP Latest Ref Rng & Units 07/09/2018 07/05/2018 07/04/2018  Glucose 70 - 99  mg/dL 102(H) 132(H) 116(H)  BUN 6 - 20 mg/dL 11 14 26(H)  Creatinine 0.44 - 1.00 mg/dL 0.80 0.82 1.13(H)  Sodium 135 - 145 mmol/L 131(L) 130(L) 128(L)  Potassium 3.5 - 5.1 mmol/L 5.1 4.3 4.1  Chloride 98 - 111 mmol/L 108 101 94(L)  CO2 22 - 32 mmol/L 17(L) 21(L) 23  Calcium 8.9 - 10.3 mg/dL 7.9(L) 7.1(L) 7.4(L)  Total Protein 6.5 - 8.1 g/dL - 6.2(L) 7.3  Total Bilirubin 0.3 - 1.2 mg/dL - 0.8 1.4(H)  Alkaline Phos 38 - 126 U/L - 104 153(H)  AST 15 - 41 U/L - 32 39  ALT 0 - 44 U/L - 12 12  . CBC    Component Value Date/Time   WBC 9.1 07/09/2018 0656   RBC 3.64 (L) 07/09/2018 0656   HGB 11.4 (L) 07/09/2018 0656   HCT 34.3 (L) 07/09/2018 0656   PLT 25 (LL) 07/09/2018 0656   MCV 94.2 07/09/2018 0656   MCH 31.3 07/09/2018 0656   MCHC 33.2 07/09/2018 0656   RDW 19.7 (H) 07/09/2018 0656   LYMPHSABS 1.3 07/05/2018 1621   MONOABS 2.2 (H) 07/05/2018 1621   EOSABS 0.0 07/05/2018 1621   BASOSABS 0.0 07/05/2018 1621     Dg Chest 2 View  Result Date: 06/18/2018 CLINICAL DATA:  Dyspnea on exertion EXAM: CHEST - 2 VIEW COMPARISON:  05/28/2018 FINDINGS: Cardiac shadow is within normal limits. The reticulonodular densities seen on prior examination have nearly completely resolved. No sizable infiltrate or effusion is seen. No bony abnormality is noted. IMPRESSION: Near complete resolution of previously seen reticulonodular densities. Electronically Signed   By: Inez Catalina M.D.   On: 06/18/2018  15:47   Ct Chest W Contrast  Result Date: 07/04/2018 CLINICAL DATA:  Increased shortness of breath EXAM: CT CHEST WITH CONTRAST TECHNIQUE: Multidetector CT imaging of the chest was performed during intravenous contrast administration. CONTRAST:  35m OMNIPAQUE IOHEXOL 300 MG/ML  SOLN COMPARISON:  05/28/2018 FINDINGS: Cardiovascular: No significant vascular findings. Normal heart size. No pericardial effusion. Mediastinum/Nodes: Stable mediastinal and bilateral hilar lymphadenopathy measuring up to 17 mm  in short axis along the right paratracheal location. Normal trachea. Normal esophagus. Normal thyroid gland. Lungs/Pleura: Diffuse bilateral interstitial thickening and fissural thickening. Peripheral sub pleural thickening again noted bilaterally. Abnormality involves bilateral upper and lower lungs with the lung bases least affected. Trace left pleural effusion. No right pleural effusion. Upper Abdomen: No acute abnormality. Musculoskeletal: No chest wall abnormality. No acute or significant osseous findings. IMPRESSION: 1. Worsening diffuse bilateral interstitial and fissural thickening with sub pleural thickening and scattered areas of ground-glass opacity. Differential considerations include chronic interstitial lung disease such as sarcoidosis versus atypical infection given the patient's history of HIV. Electronically Signed   By: HKathreen Devoid  On: 07/04/2018 18:34   Ct Biopsy  Result Date: 07/05/2018 INDICATION: History of HIV/AIDS, now with anemia and thrombocytopenia. Please perform CT-guided bone marrow biopsy for tissue diagnostic purposes. Please obtain a sample for additional culture analysis. EXAM: CT-GUIDED BONE MARROW BIOPSY AND ASPIRATION MEDICATIONS: None ANESTHESIA/SEDATION: Versed 2 mg IV Sedation Time: 10 Minutes; The patient was continuously monitored during the procedure by the interventional radiology nurse under my direct supervision. COMPLICATIONS: None immediate. PROCEDURE: Informed consent was obtained from the patient following an explanation of the procedure, risks, benefits and alternatives. The patient understands, agrees and consents for the procedure. All questions were addressed. A time out was performed prior to the initiation of the procedure. The patient was positioned prone and non-contrast localization CT was performed of the pelvis to demonstrate the iliac marrow spaces. The operative site was prepped and draped in the usual sterile fashion. Under sterile conditions and  local anesthesia, a 22 gauge spinal needle was utilized for procedural planning. Next, an 11 gauge coaxial bone biopsy needle was advanced into the left iliac marrow space. Needle position was confirmed with CT imaging. Initially, bone marrow aspiration was performed. Next, a bone marrow biopsy was obtained with the 11 gauge outer bone marrow device. Samples were prepared with the cytotechnologist and deemed adequate. The needle was removed intact. Hemostasis was obtained with compression and a dressing was placed. The patient tolerated the procedure well without immediate post procedural complication. IMPRESSION: Successful CT guided left iliac bone marrow aspiration and core biopsy. Note, a portion of the core biopsy with set aside for culture analysis. Electronically Signed   By: JSandi MariscalM.D.   On: 07/05/2018 11:43   Dg Chest Portable 1 View  Result Date: 07/04/2018 CLINICAL DATA:  Worsening shortness of breath and cough EXAM: PORTABLE CHEST 1 VIEW COMPARISON:  06/18/2018 FINDINGS: There is bilateral diffuse reticular interstitial thickening. There is no focal consolidation. There is no pleural effusion or pneumothorax. The heart and mediastinal contours are unremarkable. The osseous structures are unremarkable. IMPRESSION: Bilateral diffuse reticular interstitial thickening which may reflect mild interstitial edema versus pneumonia including atypical pneumonia. Electronically Signed   By: HKathreen Devoid  On: 07/04/2018 12:57   Ct Bone Marrow Biopsy & Aspiration  Result Date: 07/05/2018 INDICATION: History of HIV/AIDS, now with anemia and thrombocytopenia. Please perform CT-guided bone marrow biopsy for tissue diagnostic purposes. Please obtain a sample for  additional culture analysis. EXAM: CT-GUIDED BONE MARROW BIOPSY AND ASPIRATION MEDICATIONS: None ANESTHESIA/SEDATION: Versed 2 mg IV Sedation Time: 10 Minutes; The patient was continuously monitored during the procedure by the interventional  radiology nurse under my direct supervision. COMPLICATIONS: None immediate. PROCEDURE: Informed consent was obtained from the patient following an explanation of the procedure, risks, benefits and alternatives. The patient understands, agrees and consents for the procedure. All questions were addressed. A time out was performed prior to the initiation of the procedure. The patient was positioned prone and non-contrast localization CT was performed of the pelvis to demonstrate the iliac marrow spaces. The operative site was prepped and draped in the usual sterile fashion. Under sterile conditions and local anesthesia, a 22 gauge spinal needle was utilized for procedural planning. Next, an 11 gauge coaxial bone biopsy needle was advanced into the left iliac marrow space. Needle position was confirmed with CT imaging. Initially, bone marrow aspiration was performed. Next, a bone marrow biopsy was obtained with the 11 gauge outer bone marrow device. Samples were prepared with the cytotechnologist and deemed adequate. The needle was removed intact. Hemostasis was obtained with compression and a dressing was placed. The patient tolerated the procedure well without immediate post procedural complication. IMPRESSION: Successful CT guided left iliac bone marrow aspiration and core biopsy. Note, a portion of the core biopsy with set aside for culture analysis. Electronically Signed   By: Sandi Mariscal M.D.   On: 07/05/2018 11:43    ASSESSMENT AND PLAN: 1. Anemia - stable post transfusion. No overt intravascular hemolysis currently. No TTP. Could be from batrim, HIV r/o other etiologies in light of mediastinal LNadenoparthy 2. Thrombocytopenia 3. PCP , COVid19 neg 4. Sepsis secondary to #3 5. ? Sarcoidosis 6. AKI 7. HIV/AIDS  PLAN -Anemia thrombocytopenia are likely multifactorial.  She has no evidence of TTP/MAHA.  -Await results of bone marrow biopsy. -Will need to monitor counts with Bactrim -Continue folic  acid and B complex vitamin to support hematopoiesis. -Transfuse packed red blood cells for hemoglobin less than 7.0 or active bleeding. Hgb has improved to 11.4. No transfusion is indicated. Has received 4 units PRBC this admission. -Transfuse platelets for platelet count less than 20,000 if active bleeding or in the setting of sepsis.  Platelet count is 25000 today. No transfusion indicated today. Has received 2 units of platelets this admission.  -Please do 1 hour post count after each use of platelets.   Mikey Bussing, DNP, AGPCNP-BC, AOCNP   ADDENDUM  .Patient was Personally and independently interviewed, examined and relevant elements of the history of present illness were reviewed in details and an assessment and plan was created. All elements of the patient's history of present illness , assessment and plan were discussed in details with Mikey Bussing, DNP. The above documentation reflects our combined findings assessment and plan.  Discussed Prelim BM Bx read with Dr Melina Copa -- no overt evidence of malignancy,lymphoma or granuloma. Additional IHC's pending. Would likely have final results tomorrow.  Sullivan Lone MD MS

## 2018-07-10 DIAGNOSIS — N179 Acute kidney failure, unspecified: Secondary | ICD-10-CM

## 2018-07-10 DIAGNOSIS — Z20828 Contact with and (suspected) exposure to other viral communicable diseases: Secondary | ICD-10-CM

## 2018-07-10 DIAGNOSIS — D761 Hemophagocytic lymphohistiocytosis: Secondary | ICD-10-CM

## 2018-07-10 LAB — CBC WITH DIFFERENTIAL/PLATELET
Band Neutrophils: 0 %
Basophils Absolute: 0.2 10*3/uL — ABNORMAL HIGH (ref 0.0–0.1)
Basophils Relative: 3 %
Blasts: 0 %
Eosinophils Absolute: 0 10*3/uL (ref 0.0–0.5)
Eosinophils Relative: 0 %
HCT: 16.8 % — ABNORMAL LOW (ref 36.0–46.0)
Hemoglobin: 5.1 g/dL — CL (ref 12.0–15.0)
Lymphocytes Relative: 21 %
Lymphs Abs: 1.4 10*3/uL (ref 0.7–4.0)
MCH: 32.5 pg (ref 26.0–34.0)
MCHC: 30.4 g/dL (ref 30.0–36.0)
MCV: 107 fL — ABNORMAL HIGH (ref 80.0–100.0)
Metamyelocytes Relative: 0 %
Monocytes Absolute: 0.9 10*3/uL (ref 0.1–1.0)
Monocytes Relative: 14 %
Myelocytes: 0 %
Neutro Abs: 4.1 10*3/uL (ref 1.7–7.7)
Neutrophils Relative %: 62 %
Other: 0 %
Platelets: 14 10*3/uL — CL (ref 150–400)
Promyelocytes Relative: 0 %
RBC: 1.57 MIL/uL — ABNORMAL LOW (ref 3.87–5.11)
RDW: 22.1 % — ABNORMAL HIGH (ref 11.5–15.5)
WBC: 6.6 10*3/uL (ref 4.0–10.5)
nRBC: 0 /100 WBC
nRBC: 1.4 % — ABNORMAL HIGH (ref 0.0–0.2)

## 2018-07-10 LAB — BASIC METABOLIC PANEL
Anion gap: 8 (ref 5–15)
BUN: 10 mg/dL (ref 6–20)
CO2: 16 mmol/L — ABNORMAL LOW (ref 22–32)
Calcium: 7.9 mg/dL — ABNORMAL LOW (ref 8.9–10.3)
Chloride: 106 mmol/L (ref 98–111)
Creatinine, Ser: 0.78 mg/dL (ref 0.44–1.00)
GFR calc Af Amer: >60 mL/min (ref >60)
GFR calc non Af Amer: >60 mL/min (ref >60)
Glucose, Bld: 131 mg/dL — ABNORMAL HIGH (ref 70–99)
Potassium: 5.2 mmol/L — ABNORMAL HIGH (ref 3.5–5.1)
Sodium: 130 mmol/L — ABNORMAL LOW (ref 135–145)

## 2018-07-10 LAB — AEROBIC/ANAEROBIC CULTURE W GRAM STAIN (SURGICAL/DEEP WOUND): Culture: NO GROWTH

## 2018-07-10 LAB — CBC
HCT: 32.1 % — ABNORMAL LOW (ref 36.0–46.0)
Hemoglobin: 10.8 g/dL — ABNORMAL LOW (ref 12.0–15.0)
MCH: 30.9 pg (ref 26.0–34.0)
MCHC: 33.6 g/dL (ref 30.0–36.0)
MCV: 92 fL (ref 80.0–100.0)
Platelets: 37 10*3/uL — ABNORMAL LOW (ref 150–400)
RBC: 3.49 MIL/uL — ABNORMAL LOW (ref 3.87–5.11)
RDW: 19.1 % — ABNORMAL HIGH (ref 11.5–15.5)
WBC: 9.1 10*3/uL (ref 4.0–10.5)
nRBC: 0.7 % — ABNORMAL HIGH (ref 0.0–0.2)

## 2018-07-10 MED ORDER — DAPSONE 100 MG PO TABS
100.0000 mg | ORAL_TABLET | Freq: Every day | ORAL | Status: DC
  Administered 2018-07-10 – 2018-07-16 (×7): 100 mg via ORAL
  Filled 2018-07-10 (×7): qty 1

## 2018-07-10 NOTE — Progress Notes (Signed)
Patient ID: Krystal Short, female   DOB: [birth year], 33 y.o.   MRN: [id number]         [facility]  Date of Admission:  07/04/2018           Day 7 trimethoprim sulfamethoxazole and prednisone ASSESSMENT: She has had fairly prompt improvement, most likely due to blood transfusion and steroids.  I doubt that this is opportunistic infection secondary to her HIV infection.  The verbal report is that her bone marrow exam was unremarkable.  She saw Dr. Clois Comber after her hospitalization in April.  He had her stop prednisone.  I would suggest reconsulting pulmonary while she is here or shortly after discharge to see if they feel further evaluation for sarcoid is needed and for help with management of her steroids.  She has developed hyperkalemia on trimethoprim/Sulfamethoxazole.  I seriously doubt that she has pneumocystis pneumonia.  I will stop trimethoprim sulfamethoxazole and restart prophylactic dapsone.  PLAN: 1. Continue Biktarvy  2. Restart dapsone 3. Await results of bone marrow biopsy 4. Recommend pulmonary reevaluation  Principal Problem:   Acute respiratory failure with hypoxia (HCC) Active Problems:   Pulmonary infiltrates   Hilar adenopathy   Symptomatic anemia   Thrombocytopenia (HCC)   AIDS (acquired immune deficiency syndrome) (HCC)   ASCUS with positive high risk HPV cervical   Scheduled Meds: . sodium chloride   Intravenous Once  . B-complex with vitamin C  1 tablet Oral Daily  . bictegravir-emtricitabine-tenofovir AF  1 tablet Oral Daily  . Chlorhexidine Gluconate Cloth  6 each Topical Q0600  . folic acid  2 mg Oral Daily  . mouth rinse  15 mL Mouth Rinse BID  . predniSONE  40 mg Oral Q breakfast  . sulfamethoxazole-trimethoprim  2 tablet Oral Q8H   Continuous Infusions: PRN Meds:.alum & mag hydroxide-simeth, HYDROcodone-acetaminophen, HYDROcodone-homatropine, ondansetron **OR** ondansetron (ZOFRAN) IV   SUBJECTIVE: She is feeling  much better today.  She has been up walking in her room today without supplemental oxygen and has no more shortness of breath.  She has only mild and occasional dry cough.  Review of Systems: Review of Systems  Constitutional: Positive for malaise/fatigue and weight loss. Negative for chills, diaphoresis and fever.  HENT: Negative for congestion and sore throat.   Respiratory: Positive for cough. Negative for sputum production, shortness of breath and wheezing.   Cardiovascular: Negative for chest pain.  Gastrointestinal: Negative for abdominal pain, diarrhea, nausea and vomiting.  Skin: Negative for rash.    Allergies  Allergen Reactions  . Heparin     OBJECTIVE: Vitals:   07/09/18 1309 07/09/18 2114 07/10/18 0514 07/10/18 1343  BP: 92/73 1_0  Pulse: 88 93 88 84  Resp: _1 Temp: 97.8 F (36.6 C) 97.7 F (36.5 C) 98.1 F (36.7 C) 97.7 F (36.5 C)  TempSrc: Oral Oral Oral Oral  SpO2: 96% 99% 100% 100%  Weight:      Height:       Body mass index is 24.6 kg/m.  Physical Exam Constitutional:      Comments: She is smiling and in good spirits.  Cardiovascular:     Rate and Rhythm: Normal rate and regular rhythm.     Heart sounds: No murmur.  Pulmonary:     Effort: Pulmonary effort is normal.     Breath sounds: No wheezing, rhonchi or rales.  Lymphadenopathy:     Head:     Right side of head:  No submandibular adenopathy.     Left side of head: No submandibular adenopathy.     Cervical: No cervical adenopathy.     Upper Body:     Right upper body: No axillary adenopathy.     Left upper body: No axillary adenopathy.  Psychiatric:        Mood and Affect: Mood normal.     Lab Results Lab Results  Component Value Date   WBC 9.1 07/10/2018   HGB 10.8 (L) 07/10/2018   HCT 32.1 (L) 07/10/2018   MCV 92.0 07/10/2018   PLT 37 (L) 07/10/2018    Lab Results  Component Value Date   CREATININE 0.78 07/10/2018   BUN 10 07/10/2018   NA 130 (L)  07/10/2018   K 5.2 (H) 07/10/2018   CL 106 07/10/2018   CO2 16 (L) 07/10/2018    Lab Results  Component Value Date   ALT 12 07/05/2018   AST 32 07/05/2018   ALKPHOS 104 07/05/2018   BILITOT 0.8 07/05/2018    HIV 1 RNA Quant (copies/mL)  Date Value  06/20/2018 <20 DETECTED (A)  04/02/2018 <20 DETECTED (A)  12/17/2017 26 (H)   CD4 T Cell Abs (/uL)  Date Value  06/20/2018 <35 (L)  04/02/2018 10 (L)  12/17/2017 50 (L)   Microbiology: Recent Results (from the past 240 hour(s))  Blood Culture (routine x 2)     Status: None   Collection Time: 07/04/18 12:30 PM  Result Value Ref Range Status   Specimen Description   Final    BLOOD RIGHT ANTECUBITAL Performed at [facility], [place] [address], [place], [place] [address]    Special Requests   Final    BOTTLES DRAWN AEROBIC AND ANAEROBIC Blood Culture adequate volume Performed at [place] [address] [place], [place] [address]    Culture   Final    NO GROWTH 5 DAYS Performed at [facility], [place] [address], [place], [place] [address]    Report Status 07/09/2018 FINAL  Final  Blood Culture (routine x 2)     Status: None   Collection Time: 07/04/18 12:40 PM  Result Value Ref Range Status   Specimen Description   Final    BLOOD LEFT ANTECUBITAL Performed at [place] [address] [place], [place] [address]    Special Requests   Final    BOTTLES DRAWN AEROBIC AND ANAEROBIC Blood Culture adequate volume Performed at [facility] [address] [place], [place] [address]    Culture   Final    NO GROWTH 5 DAYS Performed at [facility], [place] [address], [place], [place] [address]    Report Status 07/09/2018 FINAL  Final  SARS Coronavirus 2 (CEPHEID- Performed in [place] hospital lab), Hosp Order     Status: None   Collection Time: 07/04/18  1:50 PM  Result Value Ref Range Status   SARS Coronavirus 2 NEGATIVE NEGATIVE  Final    Comment: (NOTE) If result is NEGATIVE SARS-CoV-2 target nucleic acids are NOT DETECTED. The SARS-CoV-2 RNA is generally detectable in upper and lower  respiratory specimens during the acute phase of infection. The lowest  concentration of SARS-CoV-2 viral copies this assay can detect is 250  copies / mL. A negative result does not preclude SARS-CoV-2 infection  and should not be used as the sole basis for treatment or other  patient management decisions.  A negative result may occur with  improper specimen collection / handling, submission of specimen other  than nasopharyngeal swab, presence of viral mutation(s) within the  areas targeted by this assay, and inadequate number of viral copies  (<250 copies / mL). A negative result must be combined with clinical  observations, patient history, and epidemiological information. If result is POSITIVE SARS-CoV-2 target nucleic acids are DETECTED. The SARS-CoV-2 RNA is generally detectable in upper and lower  respiratory specimens dur ing the acute phase of infection.  Positive  results are indicative of active infection with SARS-CoV-2.  Clinical  correlation with patient history and other diagnostic information is  necessary to determine patient infection status.  Positive results do  not rule out bacterial infection or co-infection with other viruses. If result is PRESUMPTIVE POSTIVE SARS-CoV-2 nucleic acids MAY BE PRESENT.   A presumptive positive result was obtained on the submitted specimen  and confirmed on repeat testing.  While 2019 novel coronavirus  (SARS-CoV-2) nucleic acids may be present in the submitted sample  additional confirmatory testing may be necessary for epidemiological  and / or clinical management purposes  to differentiate between  SARS-CoV-2 and other Sarbecovirus currently known to infect humans.  If clinically indicated additional testing with an alternate test  methodology [phone]) is advised. The  SARS-CoV-2 RNA is generally  detectable in upper and lower respiratory sp ecimens during the acute  phase of infection. The expected result is Negative. Fact Sheet for Patients:  StrictlyIdeas.no Fact Sheet for Healthcare Providers: BankingDealers.co.za This test is not yet approved or cleared by the [place] FDA and has been authorized for detection and/or diagnosis of SARS-CoV-2 by FDA under an Emergency Use Authorization (EUA).  This EUA will remain in effect (meaning this test can be used) for the duration of the COVID-19 declaration under Section 564(b)(1) of the Act, 21 U.S.C. section 360bbb-3(b)(1), unless the authorization is terminated or revoked sooner. Performed at [facility], [place] [address] [place], [place] [address]   MRSA PCR Screening     Status: None   Collection Time: 07/04/18  4:21 PM  Result Value Ref Range Status   MRSA by PCR NEGATIVE NEGATIVE Final    Comment:        The GeneXpert MRSA Assay (FDA approved for NASAL specimens only), is one component of a comprehensive MRSA colonization surveillance program. It is not intended to diagnose MRSA infection nor to guide or monitor treatment for MRSA infections. Performed at [facility], [place] [address], [place], [place] [address]   Aerobic/Anaerobic Culture (surgical/deep wound)     Status: None   Collection Time: 07/05/18 11:06 AM  Result Value Ref Range Status   Specimen Description   Final    BONE MARROW Performed at [place] [address], [place], [place] [address]    Special Requests   Final    NONE Performed at [facility], [place] [address], [place], [place] [address]    Gram Stain   Final    RARE WBC PRESENT,BOTH PMN AND MONONUCLEAR NO ORGANISMS SEEN    Culture   Final    No growth aerobically or anaerobically. Performed at [facility], [place] [address], [place], [place] [address]    Report Status 07/10/2018 FINAL  Final  Acid Fast Smear (AFB)     Status: None   Collection Time: 07/05/18 11:06 AM  Result Value Ref Range Status   AFB Specimen Processing Comment  Final    Comment:  Tissue Grinding and Digestion/Decontamination   Acid Fast Smear Negative  Final    Comment: (NOTE) Performed At: [facility] [place], [place] [id number] Rush Farmer MD OF:[id number]    Source (AFB) BONE MARROW  Final    Comment: Performed at [place] [address], [place], [place] [address]  Fungus Culture With Stain     Status: None (Preliminary result)   Collection Time: 07/05/18 11:06 AM  Result Value Ref Range Status   Fungus Stain Final report  Final    Comment: (NOTE) Performed At: [facility] [place], [place] [id number] Rush Farmer MD [id number]    Fungus (Mycology) Culture PENDING  Incomplete   Fungal Source BONE MARROW  Final    Comment: Performed at [facility], [place] [address] [place], [place] [address]  Fungus Culture Result     Status: None   Collection Time: 07/05/18 11:06 AM  Result Value Ref Range Status   Result 1 Comment  Final    Comment: (NOTE) KOH/Calcofluor preparation:  no fungus observed. Performed At: [facility] [address] [place], [place] [id number] Rush Farmer MD [id number]     Michel Bickers, [place] [facility] [place] [phone] pager   [phone] cell 07/10/2018, 3:10 PM

## 2018-07-10 NOTE — Progress Notes (Addendum)
HEMATOLOGY-ONCOLOGY PROGRESS NOTE  SUBJECTIVE: Resting quietly this morning.  She has no complaints.  Denies bleeding.  Labs from this morning are pending.  She has no other new complaints.  REVIEW OF SYSTEMS:   Constitutional: Denies fevers and chills. Eyes: Denies blurriness of vision Ears, nose, mouth, throat, and face: Denies mucositis or sore throat Respiratory: Cough and shortness of breath have resolved.  Cardiovascular: Denies palpitations, chest discomfort Gastrointestinal:  Denies nausea, heartburn or change in bowel habits Skin: Denies abnormal skin rashes Lymphatics: Denies new lymphadenopathy or easy bruising Neurological:Denies numbness, tingling or new weaknesses Behavioral/Psych: Mood is stable, no new changes  Extremities: No lower extremity edema All other systems were reviewed with the patient and are negative.  I have reviewed the past medical history, past surgical history, social history and family history with the patient and they are unchanged from previous note.   PHYSICAL EXAMINATION:  Vitals:   07/09/18 2114 07/10/18 0514  BP: 100/77 92/62  Pulse: 93 88  Resp: 15 18  Temp: 97.7 F (36.5 C) 98.1 F (36.7 C)  SpO2: 99% 100%   Filed Weights   07/04/18 1313 07/06/18 1100  Weight: 125 lb (56.7 kg) 134 lb 7.7 oz (61 kg)   . GENERAL:alert, in no acute distress and comfortable SKIN: no acute rashes, no significant lesions EYES: perl OROPHARYNX: MMM, no exudates, no oropharyngeal erythema or ulceration NECK: supple, no JVD LYMPH:  no palpable lymphadenopathy in the cervical, axillary or inguinal regions LUNGS: few basal rales on rhonci HEART: regular rate & rhythm ABDOMEN:  normoactive bowel sounds , non tender, not distended. Extremity: no pedal edema PSYCH: alert & oriented x 3 with fluent speech NEURO: no focal motor/sensory deficits   LABORATORY DATA:  I have reviewed the data as listed CMP Latest Ref Rng & Units 07/09/2018 07/05/2018 07/04/2018   Glucose 70 - 99 mg/dL 102(H) 132(H) 116(H)  BUN 6 - 20 mg/dL 11 14 26(H)  Creatinine 0.44 - 1.00 mg/dL 0.80 0.82 1.13(H)  Sodium 135 - 145 mmol/L 131(L) 130(L) 128(L)  Potassium 3.5 - 5.1 mmol/L 5.1 4.3 4.1  Chloride 98 - 111 mmol/L 108 101 94(L)  CO2 22 - 32 mmol/L 17(L) 21(L) 23  Calcium 8.9 - 10.3 mg/dL 7.9(L) 7.1(L) 7.4(L)  Total Protein 6.5 - 8.1 g/dL - 6.2(L) 7.3  Total Bilirubin 0.3 - 1.2 mg/dL - 0.8 1.4(H)  Alkaline Phos 38 - 126 U/L - 104 153(H)  AST 15 - 41 U/L - 32 39  ALT 0 - 44 U/L - 12 12  . CBC    Component Value Date/Time   WBC 9.1 07/09/2018 0656   RBC 3.64 (L) 07/09/2018 0656   HGB 11.4 (L) 07/09/2018 0656   HCT 34.3 (L) 07/09/2018 0656   PLT 25 (LL) 07/09/2018 0656   MCV 94.2 07/09/2018 0656   MCH 31.3 07/09/2018 0656   MCHC 33.2 07/09/2018 0656   RDW 19.7 (H) 07/09/2018 0656   LYMPHSABS 1.3 07/05/2018 1621   MONOABS 2.2 (H) 07/05/2018 1621   EOSABS 0.0 07/05/2018 1621   BASOSABS 0.0 07/05/2018 1621     Dg Chest 2 View  Result Date: 06/18/2018 CLINICAL DATA:  Dyspnea on exertion EXAM: CHEST - 2 VIEW COMPARISON:  05/28/2018 FINDINGS: Cardiac shadow is within normal limits. The reticulonodular densities seen on prior examination have nearly completely resolved. No sizable infiltrate or effusion is seen. No bony abnormality is noted. IMPRESSION: Near complete resolution of previously seen reticulonodular densities. Electronically Signed   By: Elta Guadeloupe  Lukens M.D.   On: 06/18/2018 15:47   Ct Chest W Contrast  Result Date: 07/04/2018 CLINICAL DATA:  Increased shortness of breath EXAM: CT CHEST WITH CONTRAST TECHNIQUE: Multidetector CT imaging of the chest was performed during intravenous contrast administration. CONTRAST:  96m OMNIPAQUE IOHEXOL 300 MG/ML  SOLN COMPARISON:  05/28/2018 FINDINGS: Cardiovascular: No significant vascular findings. Normal heart size. No pericardial effusion. Mediastinum/Nodes: Stable mediastinal and bilateral hilar lymphadenopathy  measuring up to 17 mm in short axis along the right paratracheal location. Normal trachea. Normal esophagus. Normal thyroid gland. Lungs/Pleura: Diffuse bilateral interstitial thickening and fissural thickening. Peripheral sub pleural thickening again noted bilaterally. Abnormality involves bilateral upper and lower lungs with the lung bases least affected. Trace left pleural effusion. No right pleural effusion. Upper Abdomen: No acute abnormality. Musculoskeletal: No chest wall abnormality. No acute or significant osseous findings. IMPRESSION: 1. Worsening diffuse bilateral interstitial and fissural thickening with sub pleural thickening and scattered areas of ground-glass opacity. Differential considerations include chronic interstitial lung disease such as sarcoidosis versus atypical infection given the patient's history of HIV. Electronically Signed   By: HKathreen Devoid  On: 07/04/2018 18:34   Ct Biopsy  Result Date: 07/05/2018 INDICATION: History of HIV/AIDS, now with anemia and thrombocytopenia. Please perform CT-guided bone marrow biopsy for tissue diagnostic purposes. Please obtain a sample for additional culture analysis. EXAM: CT-GUIDED BONE MARROW BIOPSY AND ASPIRATION MEDICATIONS: None ANESTHESIA/SEDATION: Versed 2 mg IV Sedation Time: 10 Minutes; The patient was continuously monitored during the procedure by the interventional radiology nurse under my direct supervision. COMPLICATIONS: None immediate. PROCEDURE: Informed consent was obtained from the patient following an explanation of the procedure, risks, benefits and alternatives. The patient understands, agrees and consents for the procedure. All questions were addressed. A time out was performed prior to the initiation of the procedure. The patient was positioned prone and non-contrast localization CT was performed of the pelvis to demonstrate the iliac marrow spaces. The operative site was prepped and draped in the usual sterile fashion. Under  sterile conditions and local anesthesia, a 22 gauge spinal needle was utilized for procedural planning. Next, an 11 gauge coaxial bone biopsy needle was advanced into the left iliac marrow space. Needle position was confirmed with CT imaging. Initially, bone marrow aspiration was performed. Next, a bone marrow biopsy was obtained with the 11 gauge outer bone marrow device. Samples were prepared with the cytotechnologist and deemed adequate. The needle was removed intact. Hemostasis was obtained with compression and a dressing was placed. The patient tolerated the procedure well without immediate post procedural complication. IMPRESSION: Successful CT guided left iliac bone marrow aspiration and core biopsy. Note, a portion of the core biopsy with set aside for culture analysis. Electronically Signed   By: JSandi MariscalM.D.   On: 07/05/2018 11:43   Dg Chest Portable 1 View  Result Date: 07/04/2018 CLINICAL DATA:  Worsening shortness of breath and cough EXAM: PORTABLE CHEST 1 VIEW COMPARISON:  06/18/2018 FINDINGS: There is bilateral diffuse reticular interstitial thickening. There is no focal consolidation. There is no pleural effusion or pneumothorax. The heart and mediastinal contours are unremarkable. The osseous structures are unremarkable. IMPRESSION: Bilateral diffuse reticular interstitial thickening which may reflect mild interstitial edema versus pneumonia including atypical pneumonia. Electronically Signed   By: HKathreen Devoid  On: 07/04/2018 12:57   Ct Bone Marrow Biopsy & Aspiration  Result Date: 07/05/2018 INDICATION: History of HIV/AIDS, now with anemia and thrombocytopenia. Please perform CT-guided bone marrow biopsy for tissue diagnostic  purposes. Please obtain a sample for additional culture analysis. EXAM: CT-GUIDED BONE MARROW BIOPSY AND ASPIRATION MEDICATIONS: None ANESTHESIA/SEDATION: Versed 2 mg IV Sedation Time: 10 Minutes; The patient was continuously monitored during the procedure by the  interventional radiology nurse under my direct supervision. COMPLICATIONS: None immediate. PROCEDURE: Informed consent was obtained from the patient following an explanation of the procedure, risks, benefits and alternatives. The patient understands, agrees and consents for the procedure. All questions were addressed. A time out was performed prior to the initiation of the procedure. The patient was positioned prone and non-contrast localization CT was performed of the pelvis to demonstrate the iliac marrow spaces. The operative site was prepped and draped in the usual sterile fashion. Under sterile conditions and local anesthesia, a 22 gauge spinal needle was utilized for procedural planning. Next, an 11 gauge coaxial bone biopsy needle was advanced into the left iliac marrow space. Needle position was confirmed with CT imaging. Initially, bone marrow aspiration was performed. Next, a bone marrow biopsy was obtained with the 11 gauge outer bone marrow device. Samples were prepared with the cytotechnologist and deemed adequate. The needle was removed intact. Hemostasis was obtained with compression and a dressing was placed. The patient tolerated the procedure well without immediate post procedural complication. IMPRESSION: Successful CT guided left iliac bone marrow aspiration and core biopsy. Note, a portion of the core biopsy with set aside for culture analysis. Electronically Signed   By: Sandi Mariscal M.D.   On: 07/05/2018 11:43    ASSESSMENT AND PLAN: 1. Anemia - stable post transfusion. No overt intravascular hemolysis currently. No TTP. Could be from batrim, HIV r/o other etiologies in light of mediastinal LNadenoparthy 2. Thrombocytopenia 3. PCP , COVid19 neg 4. Sepsis secondary to #3 5. ? Sarcoidosis 6. AKI 7. HIV/AIDS  PLAN -Anemia thrombocytopenia are likely multifactorial.  She has no evidence of TTP/MAHA.  -Preliminary bone marrow biopsy results showed no overt evidence of malignancy,  lymphoma, or granuloma.  Additional immunohistochemical stains are pending.  May have final result later today. -Will need to monitor counts with Bactrim -Continue folic acid and B complex vitamin to support hematopoiesis. -Transfuse packed red blood cells for hemoglobin less than 7.0 or active bleeding.  CBC is pending today. -Transfuse platelets for platelet count less than 20,000 if active bleeding or in the setting of sepsis.  CBC is pending today. -Please do 1 hour post count after each use of platelets.   Mikey Bussing, DNP, AGPCNP-BC, AOCNP   ADDENDUM  .Patient was Personally and independently interviewed, examined and relevant elements of the history of present illness were reviewed in details and an assessment and plan was created. All elements of the patient's history of present illness , assessment and plan were discussed in details with Mikey Bussing, DNP . The above documentation reflects our combined findings assessment and plan.  Discussed bone marrow biopsy with Dr. Melina Copa.  No overt evidence of granulomas lymphoma AFB or other overt viral inclusions.  No overt evidence of MDS-like changes related to HIV. No megaloblastoid changes. Some minor hemophagocytic forms noted in the bone marrow which is likely reactive given her recurrent infections.  Bone marrow biopsy reviewed as noted below. Results were discussed with the patient personally.  Patient has been seen by pulmonary with a plan to do bronchoscopy and possible EBUS directed lymph node biopsy.  No acute interventions for the minor hemophagocytic changes at this time other than treating underlying infectious process or acute inflammatory process.  Hopefully the  pulmonary evaluation might reveal the etiology of her pulmonary infiltrates and mediastinal adenopathy and might drive further management. She has been taken off Bactrim which might help her platelet counts and anemia would continue folic acid at 2 to 5 mg daily.   Monitor for overt hemolysis since she has been restarted on dapsone and there was some concern for presence of bite cells on her initial smear though her measured baseline G6PD levels were reasonable. If the patient develops significant pancytopenia might need to consider high-dose steroids judiciously to address any sepsis and infection related secondary hemophagocytic syndrome.   CBC    Component Value Date/Time   WBC 9.1 07/10/2018 1001   RBC 3.49 (L) 07/10/2018 1001   HGB 10.8 (L) 07/10/2018 1001   HCT 32.1 (L) 07/10/2018 1001   PLT 37 (L) 07/10/2018 1001   MCV 92.0 07/10/2018 1001   MCH 30.9 07/10/2018 1001   MCHC 33.6 07/10/2018 1001   RDW 19.1 (H) 07/10/2018 1001   LYMPHSABS 1.3 07/05/2018 1621   MONOABS 2.2 (H) 07/05/2018 1621   EOSABS 0.0 07/05/2018 1621   BASOSABS 0.0 07/05/2018 1621      Sullivan Lone MD MS

## 2018-07-10 NOTE — Progress Notes (Addendum)
[facility] PROGRESS NOTE  Krystal Short VQQ:[id number] DOB: 1985/06/14 DOA: 07/04/2018 PCP: [name]   Narrative:  33 year old African-American female Tobacco abuse ~12/2016 Diagnosed with HIV  06/05/2017 when admitted at that time screen +, HIV 1 antibody positive HIV-2 antibody negative CD4 was 10-he was started on Biktarvy and was set up for follow-up At that time she was thought to have suspected PJP, Was sent home on prednisone and Bactrim  Had 2 subsequent admissions 03/08/2018-03/10/2018  On her last admission 421 through 424 she had a CT scan which that was more suggestive of enlarged mediastinal bilateral hilar lymph nodes sarcoid and had a follow-up with Dr. Melvyn Novas of pulmonology--it was felt that time of his exam she had resolving opportunistic infection with the caveat that having just finished prednisone patient may have look better than she really was-he wanted to follow-up with her around the beginning of June but she came back to the hospital before then  she was also referred in the outpatient setting to hematology because of thrombocytopenia and anemia-  Readmitted 5/28 fevers 103 sinus tach sodium 128 hemoglobin 5.1 platelet 14 Received vancomycin cefepime azithromycin and boluses of IV fluid  -- hematology was consulted here because hemoglobin was 5.1 with severe acute thrombocytopenia platelets in the 14 range Oncology feels not TTP and felt this may be related to either dapsone or Bactrim   A & Plan  Atypical infection versus sarcoid Personally discussed with infectious disease Stopping Bactrim-greatly appreciate their input-appreciate input from pulmonology for consideration of bronchoscopy for tissue diagnosis Would continue prednisone at this time as this would treat sarcoid--dapsone being resumed Acute anemia thrombocytopeniaI  Etiology still unclear bone marrow biopsy shows trilineage forms and hemophagocytic cells which might  largely be nonspecific?  Defer to oncology further management Till date 4 units PRBC, 2 units platelets?   counts improved as below Watch counts while on dapsone--Bactrim DC 6/3 Iron and saturation ratios are normal indicators of this not being iron deficiency She tells me today that she has been spotting however and has irregular menses-see below AKI on admission Metabolic acidosis  BUN/creatinine 26/1.1 and now resolved Acidosis is probably secondary to iatrogenic medication-if it does not resolve we will initiate a search and start bicarb HGSIL Needs outpatient follow-up patient states has this in early July and should keep appointment HIV AIDS Await HIV-1 quant, fungal culture is negative AFB is negative, blood cultures are negative Defer to ID at this time?  Sarcoid counts improved as below-continue Biktarvy Former tobacco abuse and ~2018   DVT SCD code Status: Full communication: Patient alone disposition Plan: Await further work-up of anemia watch counts next several days   Verlon Au, MD  [facility] [email or web address] 7PM-7AM contact night coverage as above 07/10/2018, 3:12 PM  LOS: 6 days   Consultants:  ID  Hematology  Pulmonology  Procedures:  Multiple  Antimicrobials:  Dapsone Biktarvy  Interval history/Subjective: Seems much improved compared to prior not on oxygen Eating and drinking No chest pain but had some last night No sputum No diarrhea No dysphagia or difficulty swallowing No fever  Objective:  Vitals:  Vitals:   07/10/18 0514 07/10/18 1343  BP: 92/62 92/77  Pulse: 88 84  Resp: 18 18  Temp: 98.1 F (36.7 C) 97.7 F (36.5 C)  SpO2: 100% 100%    Exam:  Pleasant alert oriented no submandibular lymphadenopathy no supraclavicular lymphadenopathy No thyromegaly Chest clear no rales no rhonchi Abdomen soft no rebound  no guarding Range of motion intact Patient has belly ring    I have personally reviewed the  following:  DATA   Labs:  Sodium 130 potassium 5.2 BUN 30 creatinine down from admission 14/0.8-->10/0.7  CO2 16  Platelet count 19--25-->37 currently  Hemoglobin 5.1-->10.8  Imaging studies:  Multiple  Medical tests:  None  Test discussed with performing physician:  He has discussed with pulmonology in detail  Decision to obtain old records:  Yes  Review and summation of old records:  He has extensively summarized  Scheduled Meds: . sodium chloride   Intravenous Once  . B-complex with vitamin C  1 tablet Oral Daily  . bictegravir-emtricitabine-tenofovir AF  1 tablet Oral Daily  . Chlorhexidine Gluconate Cloth  6 each Topical Q0600  . folic acid  2 mg Oral Daily  . mouth rinse  15 mL Mouth Rinse BID  . predniSONE  40 mg Oral Q breakfast  . sulfamethoxazole-trimethoprim  2 tablet Oral Q8H   Continuous Infusions:  Principal Problem:   Acute respiratory failure with hypoxia (HCC) Active Problems:   AIDS (acquired immune deficiency syndrome) (HCC)   Symptomatic anemia   Thrombocytopenia (HCC)   ASCUS with positive high risk HPV cervical   Pulmonary infiltrates   Hilar adenopathy   LOS: 6 days

## 2018-07-10 NOTE — Consult Note (Signed)
NAMEGeorganne Short, MRN:  [id number], DOB:  November 02, 1985, LOS: 6 ADMISSION DATE:  07/04/2018, CONSULTATION DATE:  07/10/2018 REFERRING MD:  Verlon Au, CHIEF COMPLAINT:  pneumonia   Brief History   33 year old lady diagnosed with HIV on antiretrovirals Has been on treatment for PJP with Bactrim and steroids Concern for sarcoidosis versus opportunistic infection Had been on steroids  Significant concerns with a thrombocytopenia and anemia-relationship to dapsone or Bactrim  History of present illness   Readmitted 5/28 with fevers Started on antibiotics for pneumonia  Past Medical History   Past Medical History:  Diagnosis Date  . Acute hyponatremia 06/03/2017  . Anemia   . CAP (community acquired pneumonia) 06/03/2017  . Community acquired pneumonia 06/05/2017  . Facial dermatitis 06/03/2017  . HIV (human immunodeficiency virus infection) ([place])   . Pleurisy 06/03/2017  . Pneumonia 06/06/2017  . 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 ([place])   . Thrush of mouth and esophagus ([place])   . UTI (urinary tract infection)    Significant Hospital Events     Consults:  PCCM 07/10/2018  Procedures:  Recent bone marrow biopsy-5/29  Significant Diagnostic Tests:    Micro Data:  Blood culture 4/28>>  Antimicrobials:  Azithromycin 5/28 Cefepime 5/28 Bactrim 5/29 Vancomycin 5/28 Biktarvy  Interim history/subjective:  Feels her breathing is stable at present Denies any chest pains or discomfort  Objective   Blood pressure 92/77, pulse 84, temperature 97.7 F (36.5 C), temperature source Oral, resp. rate 18, height _0  (1.575 m), weight 61 kg, SpO2 100 %.        Intake/Output Summary (Last 24 hours) at 07/10/2018 1622 Last data filed at 07/10/2018 6734 Gross per 24 hour  Intake 360 ml  Output -  Net 360 ml   Filed Weights   07/04/18 1313 07/06/18 1100  Weight: 56.7 kg 61 kg    Examination: General: Young lady, does not appear to be in distress HENT: Moist  oral mucosa Lungs: Clear breath sounds Cardiovascular: S1-S2 appreciated Abdomen: Bowel sounds appreciated Extremities: No clubbing, no edema Neuro: Alert and oriented x3 GU:   Resolved Hospital Problem list    Assessment & Plan:   Interstitial process Multifocal infiltrates -Recent pneumonia -Concern for PJP, sarcoidosis presenting with interstitial lung disease -Bronchoscopy, endobronchial ultrasound for lymph node biopsy discussed with patient -Will schedule  Mediastinal adenopathy -May be seen in a pneumonic process, immunocompromised pneumonia, sarcoidosis is less likely to present this way without previous history chronic cough and progressive shortness of breath  AIDS  -Defer to ID  Best practice:   Disposition: plan for bronchoscopy  Labs   CBC: Recent Labs  Lab 07/04/18 1254  07/05/18 0242 07/05/18 1621 07/07/18 0528 07/08/18 0511 07/08/18 1458 07/09/18 0656 07/10/18 1001  WBC 6.6   < > 9.7 9.3 6.7 6.6  --  9.1 9.1  NEUTROABS 4.1  --  5.5 5.2  --   --   --   --   --   HGB 5.1*   < > 8.0* 8.0* 7.2* 6.6* 9.2* 11.4* 10.8*  HCT 16.8*   < > 24.8* 25.6* 23.1* 20.2* 28.2* 34.3* 32.1*  MCV 107.0*   < > 95.8 98.5 98.7 94.8  --  94.2 92.0  PLT 14*   < > 9*  9* 39* 21* 19*  --  25* 37*   < > = values in this interval not displayed.    Basic Metabolic Panel: Recent Labs  Lab 07/04/18 1254 07/05/18 0242 07/09/18 1937  07/10/18 1001  NA 128* 130* 131* 130*  K 4.1 4.3 5.1 5.2*  CL 94* 101 108 106  CO2 23 21* 17* 16*  GLUCOSE 116* 132* 102* 131*  BUN 26* _0 CREATININE 1.13* 0.82 0.80 0.78  CALCIUM 7.4* 7.1* 7.9* 7.9*   GFR: Estimated Creatinine Clearance: 87.7 mL/min (by C-G formula based on SCr of 0.78 mg/dL). Recent Labs  Lab 07/04/18 1252  07/04/18 1433  07/07/18 0528 07/08/18 0511 07/09/18 0656 07/10/18 1001  WBC  --    < >  --    < > 6.7 6.6 9.1 9.1  LATICACIDVEN 2.6*  --  2.2*  --   --   --   --   --    < > = values in this interval  not displayed.    Liver Function Tests: Recent Labs  Lab 07/04/18 1254 07/05/18 0242  AST 39 32  ALT 12 12  ALKPHOS 153* 104  BILITOT 1.4* 0.8  PROT 7.3 6.2*  ALBUMIN 2.4* 2.0*   No results for input(s): LIPASE, AMYLASE in the last 168 hours. No results for input(s): AMMONIA in the last 168 hours.  ABG    Component Value Date/Time   HCO3 16.9 (L) 05/28/2018 1651   ACIDBASEDEF 7.1 (H) 05/28/2018 1651   O2SAT 72.7 05/28/2018 1651     Coagulation Profile: Recent Labs  Lab 07/05/18 0242  INR 1.4*    Cardiac Enzymes: No results for input(s): CKTOTAL, CKMB, CKMBINDEX, TROPONINI in the last 168 hours.  HbA1C: No results found for: HGBA1C  CBG: No results for input(s): GLUCAP in the last 168 hours.  Review of Systems:   Denies significant SOB at rest, some sob with exertion Admits to occasional cough  Past Medical History  She,  has a past medical history of Acute hyponatremia (06/03/2017), Anemia, CAP (community acquired pneumonia) (06/03/2017), Community acquired pneumonia (06/05/2017), Facial dermatitis (06/03/2017), HIV (human immunodeficiency virus infection) ([place]), Pleurisy (06/03/2017), Pneumonia (06/06/2017), 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 ([place]), Thrush of mouth and esophagus ([place]), and UTI (urinary tract infection).   Surgical History    Past Surgical History:  Procedure Laterality Date  . NO PAST SURGERIES       Social History   reports that she quit smoking about 18 months ago. Her smoking use included cigarettes and cigars. She has a 2.00 pack-year smoking history. She has never used smokeless tobacco. She reports current alcohol use. She reports that she does not use drugs.   Family History   Her family history includes Asthma in her paternal grandmother; Healthy in her father and mother.   Allergies Allergies  Allergen Reactions  . Heparin

## 2018-07-11 ENCOUNTER — Inpatient Hospital Stay (HOSPITAL_COMMUNITY): Payer: 59

## 2018-07-11 ENCOUNTER — Ambulatory Visit: Payer: 59 | Admitting: Obstetrics & Gynecology

## 2018-07-11 ENCOUNTER — Telehealth: Payer: Self-pay | Admitting: Obstetrics and Gynecology

## 2018-07-11 DIAGNOSIS — R590 Localized enlarged lymph nodes: Secondary | ICD-10-CM

## 2018-07-11 DIAGNOSIS — D761 Hemophagocytic lymphohistiocytosis: Secondary | ICD-10-CM

## 2018-07-11 DIAGNOSIS — R918 Other nonspecific abnormal finding of lung field: Secondary | ICD-10-CM

## 2018-07-11 LAB — PROTIME-INR
INR: 1.1 (ref 0.8–1.2)
INR: 1.1 (ref 0.8–1.2)
Prothrombin Time: 14.1 seconds (ref 11.4–15.2)
Prothrombin Time: 14.3 seconds (ref 11.4–15.2)

## 2018-07-11 LAB — COMPREHENSIVE METABOLIC PANEL
ALT: 141 U/L — ABNORMAL HIGH (ref 0–44)
AST: 154 U/L — ABNORMAL HIGH (ref 15–41)
Albumin: 2.6 g/dL — ABNORMAL LOW (ref 3.5–5.0)
Alkaline Phosphatase: 205 U/L — ABNORMAL HIGH (ref 38–126)
Anion gap: 7 (ref 5–15)
BUN: 11 mg/dL (ref 6–20)
CO2: 17 mmol/L — ABNORMAL LOW (ref 22–32)
Calcium: 8.1 mg/dL — ABNORMAL LOW (ref 8.9–10.3)
Chloride: 106 mmol/L (ref 98–111)
Creatinine, Ser: 0.73 mg/dL (ref 0.44–1.00)
GFR calc Af Amer: >60 mL/min (ref >60)
GFR calc non Af Amer: >60 mL/min (ref >60)
Glucose, Bld: 184 mg/dL — ABNORMAL HIGH (ref 70–99)
Potassium: 4.7 mmol/L (ref 3.5–5.1)
Sodium: 130 mmol/L — ABNORMAL LOW (ref 135–145)
Total Bilirubin: 0.2 mg/dL — ABNORMAL LOW (ref 0.3–1.2)
Total Protein: 7 g/dL (ref 6.5–8.1)

## 2018-07-11 LAB — CBC WITH DIFFERENTIAL/PLATELET
Abs Immature Granulocytes: 0.64 10*3/uL — ABNORMAL HIGH (ref 0.00–0.07)
Basophils Absolute: 0.1 10*3/uL (ref 0.0–0.1)
Basophils Relative: 1 %
Eosinophils Absolute: 0 10*3/uL (ref 0.0–0.5)
Eosinophils Relative: 0 %
HCT: 33.9 % — ABNORMAL LOW (ref 36.0–46.0)
Hemoglobin: 11.2 g/dL — ABNORMAL LOW (ref 12.0–15.0)
Immature Granulocytes: 6 %
Lymphocytes Relative: 17 %
Lymphs Abs: 1.7 10*3/uL (ref 0.7–4.0)
MCH: 32.4 pg (ref 26.0–34.0)
MCHC: 33 g/dL (ref 30.0–36.0)
MCV: 98 fL (ref 80.0–100.0)
Monocytes Absolute: 1.8 10*3/uL — ABNORMAL HIGH (ref 0.1–1.0)
Monocytes Relative: 17 %
Neutro Abs: 6.2 10*3/uL (ref 1.7–7.7)
Neutrophils Relative %: 59 %
Platelets: 28 10*3/uL — CL (ref 150–400)
RBC: 3.46 MIL/uL — ABNORMAL LOW (ref 3.87–5.11)
RDW: 19.2 % — ABNORMAL HIGH (ref 11.5–15.5)
WBC: 10.5 10*3/uL (ref 4.0–10.5)
nRBC: 0.5 % — ABNORMAL HIGH (ref 0.0–0.2)

## 2018-07-11 LAB — LACTATE DEHYDROGENASE: LDH: 298 U/L — ABNORMAL HIGH (ref 98–192)

## 2018-07-11 LAB — HIV-1 RNA QUANT-NO REFLEX-BLD
HIV 1 RNA Quant: <20 copies/mL
LOG10 HIV-1 RNA: UNDETERMINED log10copy/mL

## 2018-07-11 LAB — CBC
HCT: 34.8 % — ABNORMAL LOW (ref 36.0–46.0)
Hemoglobin: 10.9 g/dL — ABNORMAL LOW (ref 12.0–15.0)
MCH: 31.2 pg (ref 26.0–34.0)
MCHC: 31.3 g/dL (ref 30.0–36.0)
MCV: 99.7 fL (ref 80.0–100.0)
Platelets: 27 10*3/uL — CL (ref 150–400)
RBC: 3.49 MIL/uL — ABNORMAL LOW (ref 3.87–5.11)
RDW: 19.1 % — ABNORMAL HIGH (ref 11.5–15.5)
WBC: 10.7 10*3/uL — ABNORMAL HIGH (ref 4.0–10.5)
nRBC: 0.3 % — ABNORMAL HIGH (ref 0.0–0.2)

## 2018-07-11 LAB — TYPE AND SCREEN
ABO/RH(D): O POS
Antibody Screen: NEGATIVE

## 2018-07-11 LAB — APTT: aPTT: 36 seconds (ref 24–36)

## 2018-07-11 LAB — URIC ACID: Uric Acid, Serum: 3.3 mg/dL (ref 2.5–7.1)

## 2018-07-11 MED ORDER — SODIUM CHLORIDE 0.9% IV SOLUTION
Freq: Once | INTRAVENOUS | Status: AC
  Administered 2018-07-11: 11:00:00 via INTRAVENOUS

## 2018-07-11 MED ORDER — IOHEXOL 300 MG/ML  SOLN
100.0000 mL | Freq: Once | INTRAMUSCULAR | Status: AC | PRN
  Administered 2018-07-11: 100 mL via INTRAVENOUS

## 2018-07-11 MED ORDER — IOHEXOL 300 MG/ML  SOLN
30.0000 mL | Freq: Once | INTRAMUSCULAR | Status: AC | PRN
  Administered 2018-07-11: 30 mL via ORAL

## 2018-07-11 MED ORDER — SODIUM CHLORIDE (PF) 0.9 % IJ SOLN
INTRAMUSCULAR | Status: AC
  Filled 2018-07-11: qty 50

## 2018-07-11 MED ORDER — SODIUM BICARBONATE 650 MG PO TABS
650.0000 mg | ORAL_TABLET | Freq: Every day | ORAL | Status: DC
  Administered 2018-07-11 – 2018-07-16 (×6): 650 mg via ORAL
  Filled 2018-07-11 (×6): qty 1

## 2018-07-11 NOTE — Progress Notes (Addendum)
HEMATOLOGY-ONCOLOGY PROGRESS NOTE  SUBJECTIVE: Pt has no new issues this morning. No bleeding. For bronchoscopy on 07/12/2018.  REVIEW OF SYSTEMS:   Constitutional: Denies fevers and chills. Eyes: Denies blurriness of vision Ears, nose, mouth, throat, and face: Denies mucositis or sore throat Respiratory: Cough and shortness of breath have resolved.  Cardiovascular: Denies palpitations, chest discomfort Gastrointestinal:  Denies nausea, heartburn or change in bowel habits Skin: Denies abnormal skin rashes Lymphatics: Denies new lymphadenopathy or easy bruising Neurological:Denies numbness, tingling or new weaknesses Behavioral/Psych: Mood is stable, no new changes  Extremities: No lower extremity edema All other systems were reviewed with the patient and are negative.  I have reviewed the past medical history, past surgical history, social history and family history with the patient and they are unchanged from previous note.   PHYSICAL EXAMINATION:  Vitals:   07/11/18 1252 07/11/18 1315  BP: 98/68 101/68  Pulse: 89 94  Resp: 16 18  Temp: 98 F (36.7 C) 98 F (36.7 C)  SpO2: 98% 96%   Filed Weights   07/04/18 1313 07/06/18 1100  Weight: 125 lb (56.7 kg) 134 lb 7.7 oz (61 kg)   . GENERAL:alert, in no acute distress and comfortable SKIN: no acute rashes, no significant lesions EYES: perl OROPHARYNX: MMM, no exudates, no oropharyngeal erythema or ulceration NECK: supple, no JVD LYMPH:  no palpable lymphadenopathy in the cervical, axillary or inguinal regions LUNGS: few basal rales on rhonci HEART: regular rate & rhythm ABDOMEN:  normoactive bowel sounds , non tender, not distended. Extremity: no pedal edema PSYCH: alert & oriented x 3 with fluent speech NEURO: no focal motor/sensory deficits   LABORATORY DATA:  I have reviewed the data as listed CMP Latest Ref Rng & Units 07/11/2018 07/10/2018 07/09/2018  Glucose 70 - 99 mg/dL 184(H) 131(H) 102(H)  BUN 6 - 20 mg/dL _0 Creatinine 0.44 - 1.00 mg/dL 0.73 0.78 0.80  Sodium 135 - 145 mmol/L 130(L) 130(L) 131(L)  Potassium 3.5 - 5.1 mmol/L 4.7 5.2(H) 5.1  Chloride 98 - 111 mmol/L 106 106 108  CO2 22 - 32 mmol/L 17(L) 16(L) 17(L)  Calcium 8.9 - 10.3 mg/dL 8.1(L) 7.9(L) 7.9(L)  Total Protein 6.5 - 8.1 g/dL 7.0 - -  Total Bilirubin 0.3 - 1.2 mg/dL 0.2(L) - -  Alkaline Phos 38 - 126 U/L 205(H) - -  AST 15 - 41 U/L 154(H) - -  ALT 0 - 44 U/L 141(H) - -  . CBC    Component Value Date/Time   WBC 10.7 (H) 07/11/2018 1109   RBC 3.49 (L) 07/11/2018 1109   HGB 10.9 (L) 07/11/2018 1109   HCT 34.8 (L) 07/11/2018 1109   PLT 27 (LL) 07/11/2018 1109   MCV 99.7 07/11/2018 1109   MCH 31.2 07/11/2018 1109   MCHC 31.3 07/11/2018 1109   RDW 19.1 (H) 07/11/2018 1109   LYMPHSABS 1.7 07/11/2018 0848   MONOABS 1.8 (H) 07/11/2018 0848   EOSABS 0.0 07/11/2018 0848   BASOSABS 0.1 07/11/2018 0848     Dg Chest 2 View  Result Date: 06/18/2018 CLINICAL DATA:  Dyspnea on exertion EXAM: CHEST - 2 VIEW COMPARISON:  05/28/2018 FINDINGS: Cardiac shadow is within normal limits. The reticulonodular densities seen on prior examination have nearly completely resolved. No sizable infiltrate or effusion is seen. No bony abnormality is noted. IMPRESSION: Near complete resolution of previously seen reticulonodular densities. Electronically Signed   By: Inez Catalina M.D.   On: 06/18/2018 15:47   Ct Chest W  Contrast ° °Result Date: 07/04/2018 °CLINICAL DATA:  Increased shortness of breath EXAM: CT CHEST WITH CONTRAST TECHNIQUE: Multidetector CT imaging of the chest was performed during intravenous contrast administration. CONTRAST:  75mL OMNIPAQUE IOHEXOL 300 MG/ML  SOLN COMPARISON:  05/28/2018 FINDINGS: Cardiovascular: No significant vascular findings. Normal heart size. No pericardial effusion. Mediastinum/Nodes: Stable mediastinal and bilateral hilar lymphadenopathy measuring up to 17 mm in short axis along the right paratracheal location.  Normal trachea. Normal esophagus. Normal thyroid gland. Lungs/Pleura: Diffuse bilateral interstitial thickening and fissural thickening. Peripheral sub pleural thickening again noted bilaterally. Abnormality involves bilateral upper and lower lungs with the lung bases least affected. Trace left pleural effusion. No right pleural effusion. Upper Abdomen: No acute abnormality. Musculoskeletal: No chest wall abnormality. No acute or significant osseous findings. IMPRESSION: 1. Worsening diffuse bilateral interstitial and fissural thickening with sub pleural thickening and scattered areas of ground-glass opacity. Differential considerations include chronic interstitial lung disease such as sarcoidosis versus atypical infection given the patient's history of HIV. Electronically Signed   By: Hetal  Patel   On: 07/04/2018 18:34  ° °Ct Biopsy ° °Result Date: 07/05/2018 °INDICATION: History of HIV/AIDS, now with anemia and thrombocytopenia. Please perform CT-guided bone marrow biopsy for tissue diagnostic purposes. Please obtain a sample for additional culture analysis. EXAM: CT-GUIDED BONE MARROW BIOPSY AND ASPIRATION MEDICATIONS: None ANESTHESIA/SEDATION: Versed 2 mg IV Sedation Time: 10 Minutes; The patient was continuously monitored during the procedure by the interventional radiology nurse under my direct supervision. COMPLICATIONS: None immediate. PROCEDURE: Informed consent was obtained from the patient following an explanation of the procedure, risks, benefits and alternatives. The patient understands, agrees and consents for the procedure. All questions were addressed. A time out was performed prior to the initiation of the procedure. The patient was positioned prone and non-contrast localization CT was performed of the pelvis to demonstrate the iliac marrow spaces. The operative site was prepped and draped in the usual sterile fashion. Under sterile conditions and local anesthesia, a 22 gauge spinal needle was  utilized for procedural planning. Next, an 11 gauge coaxial bone biopsy needle was advanced into the left iliac marrow space. Needle position was confirmed with CT imaging. Initially, bone marrow aspiration was performed. Next, a bone marrow biopsy was obtained with the 11 gauge outer bone marrow device. Samples were prepared with the cytotechnologist and deemed adequate. The needle was removed intact. Hemostasis was obtained with compression and a dressing was placed. The patient tolerated the procedure well without immediate post procedural complication. IMPRESSION: Successful CT guided left iliac bone marrow aspiration and core biopsy. Note, a portion of the core biopsy with set aside for culture analysis. Electronically Signed   By: John  Watts M.D.   On: 07/05/2018 11:43  ° °Dg Chest Portable 1 View ° °Result Date: 07/04/2018 °CLINICAL DATA:  Worsening shortness of breath and cough EXAM: PORTABLE CHEST 1 VIEW COMPARISON:  06/18/2018 FINDINGS: There is bilateral diffuse reticular interstitial thickening. There is no focal consolidation. There is no pleural effusion or pneumothorax. The heart and mediastinal contours are unremarkable. The osseous structures are unremarkable. IMPRESSION: Bilateral diffuse reticular interstitial thickening which may reflect mild interstitial edema versus pneumonia including atypical pneumonia. Electronically Signed   By: Hetal  Patel   On: 07/04/2018 12:57  ° °Ct Bone Marrow Biopsy & Aspiration ° °Result Date: 07/05/2018 °INDICATION: History of HIV/AIDS, now with anemia and thrombocytopenia. Please perform CT-guided bone marrow biopsy for tissue diagnostic purposes. Please obtain a sample for additional culture analysis. EXAM: CT-GUIDED BONE   MARROW BIOPSY AND ASPIRATION MEDICATIONS: None ANESTHESIA/SEDATION: Versed 2 mg IV Sedation Time: 10 Minutes; The patient was continuously monitored during the procedure by the interventional radiology nurse under my direct supervision.  COMPLICATIONS: None immediate. PROCEDURE: Informed consent was obtained from the patient following an explanation of the procedure, risks, benefits and alternatives. The patient understands, agrees and consents for the procedure. All questions were addressed. A time out was performed prior to the initiation of the procedure. The patient was positioned prone and non-contrast localization CT was performed of the pelvis to demonstrate the iliac marrow spaces. The operative site was prepped and draped in the usual sterile fashion. Under sterile conditions and local anesthesia, a 22 gauge spinal needle was utilized for procedural planning. Next, an 11 gauge coaxial bone biopsy needle was advanced into the left iliac marrow space. Needle position was confirmed with CT imaging. Initially, bone marrow aspiration was performed. Next, a bone marrow biopsy was obtained with the 11 gauge outer bone marrow device. Samples were prepared with the cytotechnologist and deemed adequate. The needle was removed intact. Hemostasis was obtained with compression and a dressing was placed. The patient tolerated the procedure well without immediate post procedural complication. IMPRESSION: Successful CT guided left iliac bone marrow aspiration and core biopsy. Note, a portion of the core biopsy with set aside for culture analysis. Electronically Signed   By: John  Watts M.D.   On: 07/05/2018 11:43  ° ° ° ° °ASSESSMENT AND PLAN: °1. Anemia - stable post transfusion. No overt intravascular hemolysis currently. No TTP. Could be from batrim, HIV r/o other etiologies in light of mediastinal LNadenoparthy °2. Thrombocytopenia °3. PCP , COVid19 neg °4. Sepsis secondary to #3; resolved °5. ? Sarcoidosis °6. AKI; resolved °7. HIV/AIDS ° °PLAN °-Anemia thrombocytopenia are likely multifactorial.  She has no evidence of TTP/MAHA.  °-Bone marrow biopsy results showed no overt evidence of granulomas lymphoma AFB or other overt viral inclusions.  No  overt evidence of MDS-like changes related to HIV. No megaloblastoid changes. °Some minor hemophagocytic forms noted in the bone marrow which is likely reactive given her recurrent infections. No acute interventions for the minor hemophagocytic changes at this time other than treating underlying infectious process or acute inflammatory process.  °-Await results of bronchoscopy °-Bactrim D/C on 07/10/2018; hopefully should help her platelet count and anemia. °-Monitor for overt hemolysis since she has been restarted on dapsone and there was some concern for presence of bite cells on her initial smear though her measured baseline G6PD levels were reasonable. °-Continue folic acid and B complex vitamin to support hematopoiesis. °-Transfuse packed red blood cells for hemoglobin less than 7.0 or active bleeding.  No transfusion indicated today °-Transfuse platelets for platelet count less than 20,000 if active bleeding or in the setting of sepsis. No transfusion indicated today. °-Please do 1 hour post count after each use of platelets. ° ° °Kristin Curcio, DNP, AGPCNP-BC, AOCNP ° °ADDENDUM ° °.Patient was Personally and independently interviewed, examined and relevant elements of the history of present illness were reviewed in details and an assessment and plan was created. All elements of the patient's history of present illness , assessment and plan were discussed in details with Kristin Curcio, DNP. The above documentation reflects our combined findings assessment and plan. °  °. °CBC Latest Ref Rng & Units 07/12/2018 07/11/2018 07/11/2018  °WBC 4.0 - 10.5 K/uL 12.0(H) 10.7(H) 10.5  °Hemoglobin 12.0 - 15.0 g/dL 11.0(L) 10.9(L) 11.2(L)  °Hematocrit 36.0 - 46.0 % 35.2(L) 34.8(L) 33.9(L)  °  Platelets 150 - 400 K/uL 56(L) 27(LL) 28(LL)   PLT improved today off Bactrim Bronchoscopic sampling done -- results pending. No other acute hematologic interventions at this time. -monitor counts and transfuse as needed. -ID ,  pulmonary and hospitalist input noted  Sullivan Lone MD MS

## 2018-07-11 NOTE — Progress Notes (Signed)
CRITICAL VALUE ALERT  Critical Value:  Platelet = 28  Date & Time Notied:  07/11/2018 at 1022  Provider Notified: yes  Orders Received/Actions taken: waiting for new order. Patient is alert and oriented x4.

## 2018-07-11 NOTE — Progress Notes (Signed)
[facility] PROGRESS NOTE  Krystal Short KXF:[id number] DOB: Jan 14, 1986 DOA: 07/04/2018 PCP: [name]   Narrative:  33 year old African-American female Tobacco abuse ~12/2016 Diagnosed with HIV 06/05/2017 when admitted screen +, HIV 1 antibody positive HIV-2 antibody negative CD4 was 10-he was started on Biktarvy and was set up for follow-up At that time she was thought to have suspected PJP, Was sent home on prednisone and Bactrim  Had 2 subsequent admissions 03/08/2018-03/10/2018  On her last admission 4/21 through 424 she had a CT scan which that was more suggestive of enlarged mediastinal bilateral hilar lymph nodes sarcoid and had a follow-up with Dr. Melvyn Novas of pulmonology--?she had resolving opportunistic infection with the caveat that having just finished prednisone patient may have look better than she really was-he wanted to follow-up with her around the beginning of June but she came back to the hospital before then  she was also referred in the outpatient setting to hematology because of thrombocytopenia and anemia-  Readmitted 5/28 fevers 103 sinus tach sodium 128 hemoglobin 5.1 platelet 14 Received vancomycin cefepime azithromycin and boluses of IV fluid  -- hematology was consulted here because hemoglobin was 5.1 with severe acute thrombocytopenia platelets in the 14 range Oncology feels not TTP, ID input has been helpful in addition to pulmonology delineating planning She had a bone marrow biopsy 5/29 showing trilineage forms hemophagocytic cells which was nonspecific    A & Plan  Atypical infection versus sarcoid Hoping bronchoscopy 6/5 can shed some light on diagnosis and underlying etiology? Some evidence Mycoplasma PNa can cause some of the lab anomalies we are seeing--defer to ID counsel however prednisone at this time-off Bactrim--dapsone 6/3 Appreciate multiple consultant input in this interesting case Acute anemia  thrombocytopenia Irregular periods on Depo-Provera since December 2019 for contraception, not menorrhagia Etiology still unclear-- Defer to oncology further management Till date 4 units PRBC, 2 units platelets?  Will need another transfusion prior to bronchoscopy Left shift/Reticulosytocis points to immature precursors, platelet smear 5/28 -feel this may be either an infection that has not been picked up or hematological malignancy?-not TTP Will check Uric acid, LDH in am and rpt Haptoglobin In addition I am able to palpate a slightly enlarged liver?  Spleen so I will order CT scan abdomen pelvis for completion state AKI on admission Metabolic acidosis non-anion gap  BUN/creatinine 26/1.1 and now resolved Starting bicarb 650 twice daily HGSIL Needs outpatient follow-up patient states has this in early July and should keep appointment Do not know if this has some bearing on her low blood counts?  Might need transvaginal ultrasound HIV AIDS Await HIV-1 quant, fungal culture is negative AFB is negative, blood cultures are negative Defer to ID-continue Biktarvy, dapsone as above Former tobacco abuse and ~2018   DVT SCD code Status: Full communication: Patient alone disposition Plan: Await further work-up of anemia watch counts next several days   Verlon Au, MD  [facility] [place] [email or web address] 7PM-7AM contact night coverage as above 07/11/2018, 1:04 PM  LOS: 7 days   Consultants:  ID  Hematology  Pulmonology  Procedures:  Multiple  Antimicrobials:  Dapsone Biktarvy  Interval history/Subjective:  Patient getting platelets for procedure tomorrow No distress Some mild vaginal spotting No dysuria, - diarrhea, - fever, - chills, - dysphagia - Cough  Objective:  Vitals:  Vitals:   07/11/18 0541 07/11/18 1252  BP: 104/80 98/68  Pulse: 93 89  Resp: 20 16  Temp: 98.1 F (36.7 C) 98 F (36.7  C)  SpO2: 97% 98%    Exam:  Pleasant alert coherent no  distress EOMI NCAT mild pallor no icterus GCS 15 Mallampati 1 No submandibular lymphadenopathy Chest clear Slight increased percussion note below right costal margin, bimanual examination reveals slightly enlarged spleen Abdomen distended Vaginal exam deferred Lower extremities are soft supple    I have personally reviewed the following:  DATA  Labs:  Sodium 130 potassium 5.2->4.7  BUN stable  CO2 17  Platelet count 19--25-->37-->27 once again currently  Hemoglobin 5.1-->10.8  Imaging studies:  Multiple  Medical tests:  None  Test discussed with performing physician:  He has discussed with pulmonology in detail  Decision to obtain old records:  Yes  Review and summation of old records:  He has extensively summarized  Scheduled Meds: . sodium chloride   Intravenous Once  . B-complex with vitamin C  1 tablet Oral Daily  . bictegravir-emtricitabine-tenofovir AF  1 tablet Oral Daily  . Chlorhexidine Gluconate Cloth  6 each Topical Q0600  . dapsone  100 mg Oral Daily  . folic acid  2 mg Oral Daily  . mouth rinse  15 mL Mouth Rinse BID  . predniSONE  40 mg Oral Q breakfast   Continuous Infusions:  Principal Problem:   Acute respiratory failure with hypoxia (HCC) Active Problems:   AIDS (acquired immune deficiency syndrome) (HCC)   Symptomatic anemia   Thrombocytopenia (HCC)   ASCUS with positive high risk HPV cervical   Pulmonary infiltrates   Hilar adenopathy   Hemophagocytosis present in bone marrow ([place])   LOS: 7 days

## 2018-07-11 NOTE — Progress Notes (Signed)
NAMEAdella Short, MRN:  [id number], DOB:  26-Aug-1985, LOS: 7 ADMISSION DATE:  07/04/2018, CONSULTATION DATE:  07/11/2018 REFERRING MD:  Verlon Au, CHIEF COMPLAINT: Pneumonia  Brief History   33 year old lady diagnosed with HIV on antiretrovirals Has been on treatment for PJP with Bactrim and steroids Concern for sarcoidosis versus opportunistic infection Had been on steroids  Significant concerns with a thrombocytopenia and anemia-relationship to dapsone or Bactrim  History of present illness   Readmitted 5/28 with fevers Started on antibiotics for pneumonia  Past Medical History   Past Medical History:  Diagnosis Date  . Acute hyponatremia 06/03/2017  . Anemia   . CAP (community acquired pneumonia) 06/03/2017  . Community acquired pneumonia 06/05/2017  . Facial dermatitis 06/03/2017  . HIV (human immunodeficiency virus infection) ([place])   . Pleurisy 06/03/2017  . Pneumonia 06/06/2017  . 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 ([place])   . Thrush of mouth and esophagus ([place])   . UTI (urinary tract infection)    Significant Hospital Events     Consults:  PCCM 07/09/2018  Procedures:  Recent bone marrow biopsy-5/29  Significant Diagnostic Tests:    Micro Data:  Blood culture 4/28>>  Antimicrobials:  Azithromycin 5/28 Cefepime 5/28 Bactrim 5/29 Vancomycin 5/28 Biktarvy   Interim history/subjective:  Feels breathing is stable Comfortable Objective   Blood pressure 104/80, pulse 93, temperature 98.1 F (36.7 C), temperature source Oral, resp. rate 20, height 5' 2"  (1.575 m), weight 61 kg, SpO2 97 %.        Intake/Output Summary (Last 24 hours) at 07/11/2018 1043 Last data filed at 07/11/2018 1607 Gross per 24 hour  Intake 600 ml  Output 300 ml  Net 300 ml   Filed Weights   07/04/18 1313 07/06/18 1100  Weight: 56.7 kg 61 kg    Examination: General: Young lady, does not appear to be in distress HENT: Moist oral mucosa Lungs: Clear breath  sounds Cardiovascular: S1-S2 appreciated Abdomen: Bowel sounds appreciated Extremities: No clubbing, no edema Neuro: Alert and oriented x3 GU:   Resolved Hospital Problem list     Assessment & Plan:   Abnormal CT scan of the chest showing multifocal infiltrates, interstitial process -Recent pneumonia -Concern for BJP, sarcoidosis -Bronchoscopy scheduled for tomorrow at 10 AM -Ultrasound-guided lymph node aspiration scheduled  Mediastinal adenopathy -May be secondary to pneumonic process -Concern for immunocompromised pneumonia -Sarcoidosis is less likely as she did not have any significant previous history-less likely to present with fibrotic sarcoidosis without significant history in the past  AIDS -Defer to ID  Best practice:   Disposition: Plan for bronchoscopy Transfuse prior to bronchoscopy as platelet count is still too low for any biopsies  Labs   CBC: Recent Labs  Lab 07/04/18 1254  07/05/18 0242 07/05/18 1621 07/07/18 0528 07/08/18 0511 07/08/18 1458 07/09/18 0656 07/10/18 1001 07/11/18 0848  WBC 6.6   < > 9.7 9.3 6.7 6.6  --  9.1 9.1 10.5  NEUTROABS 4.1  --  5.5 5.2  --   --   --   --   --  6.2  HGB 5.1*   < > 8.0* 8.0* 7.2* 6.6* 9.2* 11.4* 10.8* 11.2*  HCT 16.8*   < > 24.8* 25.6* 23.1* 20.2* 28.2* 34.3* 32.1* 33.9*  MCV 107.0*   < > 95.8 98.5 98.7 94.8  --  94.2 92.0 98.0  PLT 14*   < > 9*  9* 39* 21* 19*  --  25* 37* 28*   < > =  values in this interval not displayed.    Basic Metabolic Panel: Recent Labs  Lab 07/04/18 1254 07/05/18 0242 07/09/18 0656 07/10/18 1001 07/11/18 0848  NA 128* 130* 131* 130* 130*  K 4.1 4.3 5.1 5.2* 4.7  CL 94* 101 108 106 106  CO2 23 21* 17* 16* 17*  GLUCOSE 116* 132* 102* 131* 184*  BUN 26* 14 11 10 11   CREATININE 1.13* 0.82 0.80 0.78 0.73  CALCIUM 7.4* 7.1* 7.9* 7.9* 8.1*   GFR: Estimated Creatinine Clearance: 87.7 mL/min (by C-G formula based on SCr of 0.73 mg/dL). Recent Labs  Lab 07/04/18 1252   07/04/18 1433  07/08/18 0511 07/09/18 0656 07/10/18 1001 07/11/18 0848  WBC  --    < >  --    < > 6.6 9.1 9.1 10.5  LATICACIDVEN 2.6*  --  2.2*  --   --   --   --   --    < > = values in this interval not displayed.    Liver Function Tests: Recent Labs  Lab 07/04/18 1254 07/05/18 0242 07/11/18 0848  AST 39 32 154*  ALT 12 12 141*  ALKPHOS 153* 104 205*  BILITOT 1.4* 0.8 0.2*  PROT 7.3 6.2* 7.0  ALBUMIN 2.4* 2.0* 2.6*   No results for input(s): LIPASE, AMYLASE in the last 168 hours. No results for input(s): AMMONIA in the last 168 hours.  ABG    Component Value Date/Time   HCO3 16.9 (L) 05/28/2018 1651   ACIDBASEDEF 7.1 (H) 05/28/2018 1651   O2SAT 72.7 05/28/2018 1651     Coagulation Profile: Recent Labs  Lab 07/05/18 0242 07/11/18 0848  INR 1.4* 1.1    Cardiac Enzymes: No results for input(s): CKTOTAL, CKMB, CKMBINDEX, TROPONINI in the last 168 hours.  HbA1C: No results found for: HGBA1C  CBG: No results for input(s): GLUCAP in the last 168 hours.  Review of Systems:   Review of Systems  HENT: Negative.   Eyes: Negative.   Respiratory: Positive for cough.   Cardiovascular: Negative for chest pain.  Gastrointestinal: Negative.   Genitourinary: Negative.   Skin: Negative.      Past Medical History  She,  has a past medical history of Acute hyponatremia (06/03/2017), Anemia, CAP (community acquired pneumonia) (06/03/2017), Community acquired pneumonia (06/05/2017), Facial dermatitis (06/03/2017), HIV (human immunodeficiency virus infection) ([place]), Pleurisy (06/03/2017), Pneumonia (06/06/2017), 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 ([place]), Thrush of mouth and esophagus ([place]), and UTI (urinary tract infection).   Surgical History    Past Surgical History:  Procedure Laterality Date  . NO PAST SURGERIES       Social History   reports that she quit smoking about 18 months ago. Her smoking use included cigarettes and cigars. She has a 2.00  pack-year smoking history. She has never used smokeless tobacco. She reports current alcohol use. She reports that she does not use drugs.   Family History   Her family history includes Asthma in her paternal grandmother; Healthy in her father and mother.   Allergies Allergies  Allergen Reactions  . Heparin      Home Medications  Prior to Admission medications   Medication Sig Start Date End Date Taking? Authorizing Provider

## 2018-07-11 NOTE — Progress Notes (Signed)
Patient ID: Krystal Short, female   DOB: May 09, 1985, 33 y.o.   MRN: [id number]         [facility]  Date of Admission:  07/04/2018         ASSESSMENT: The cause of Krystal Short's anemia, thrombocytopenia, interstitial infiltrates and hilar adenopathy remain uncertain.  I think opportunistic infection related to HIV is relatively unlikely.  When first diagnosed 1 year ago her viral load was 344,000 but she had prompt and complete viral suppression to less than 20 making complications of HIV unlikely even before she has had significant CD4 reconstitution.  Since her CD4 count remains very low it is unlikely that her recent acute illnesses are due to an immune reconstitution syndrome.  Most recently she has been on dapsone for pneumocystis prophylaxis.  It sounds like her adherence has been perfect.  Dapsone and Biktarvy are unlikely to cause severe cytopenias like she has.  Pulmonary is planning a bronchoscopy and lymph node biopsy tomorrow.  PLAN: 1. Continue Biktarvy, dapsone and prednisone 2. Await results of bronchoscopy and lymph node biopsy  Principal Problem:   Acute respiratory failure with hypoxia (HCC) Active Problems:   Pulmonary infiltrates   Hilar adenopathy   Symptomatic anemia   Thrombocytopenia (HCC)   AIDS (acquired immune deficiency syndrome) (HCC)   ASCUS with positive high risk HPV cervical   Hemophagocytosis present in bone marrow (HCC)   Scheduled Meds: . sodium chloride   Intravenous Once  . B-complex with vitamin C  1 tablet Oral Daily  . bictegravir-emtricitabine-tenofovir AF  1 tablet Oral Daily  . Chlorhexidine Gluconate Cloth  6 each Topical Q0600  . dapsone  100 mg Oral Daily  . folic acid  2 mg Oral Daily  . mouth rinse  15 mL Mouth Rinse BID  . predniSONE  40 mg Oral Q breakfast   Continuous Infusions: PRN Meds:.alum & mag hydroxide-simeth, HYDROcodone-acetaminophen, HYDROcodone-homatropine, ondansetron **OR** ondansetron  (ZOFRAN) IV   SUBJECTIVE: Krystal Short is feeling pretty much back to normal today.  Review of Systems: Review of Systems  Constitutional: Positive for weight loss. Negative for chills, diaphoresis, fever and malaise/fatigue.  HENT: Negative for congestion and sore throat.   Respiratory: Negative for cough, sputum production, shortness of breath and wheezing.   Cardiovascular: Negative for chest pain.  Gastrointestinal: Negative for abdominal pain, diarrhea, nausea and vomiting.  Skin: Negative for rash.    Allergies  Allergen Reactions  . Heparin     OBJECTIVE: Vitals:   07/10/18 0514 07/10/18 1343 07/10/18 2153 07/11/18 0541  BP: 92/62 92/77 115/82 104/80  Pulse: 88 84 82 93  Resp: 18 18 20 20   Temp: 98.1 F (36.7 C) 97.7 F (36.5 C) 98.2 F (36.8 C) 98.1 F (36.7 C)  TempSrc: Oral Oral Oral Oral  SpO2: 100% 100% 98% 97%  Weight:      Height:       Body mass index is 24.6 kg/m.  Physical Exam Constitutional:      Comments: She is smiling and in good spirits.  Cardiovascular:     Rate and Rhythm: Normal rate and regular rhythm.     Heart sounds: No murmur.  Pulmonary:     Effort: Pulmonary effort is normal.     Breath sounds: Normal breath sounds. No wheezing, rhonchi or rales.  Lymphadenopathy:     Head:     Right side of head: No submandibular adenopathy.     Left side of head: No submandibular adenopathy.  Cervical: No cervical adenopathy.     Upper Body:     Right upper body: No axillary adenopathy.     Left upper body: No axillary adenopathy.  Psychiatric:        Mood and Affect: Mood normal.     Lab Results Lab Results  Component Value Date   WBC 10.5 07/11/2018   HGB 11.2 (L) 07/11/2018   HCT 33.9 (L) 07/11/2018   MCV 98.0 07/11/2018   PLT 28 (LL) 07/11/2018    Lab Results  Component Value Date   CREATININE 0.73 07/11/2018   BUN 11 07/11/2018   NA 130 (L) 07/11/2018   K 4.7 07/11/2018   CL 106 07/11/2018   CO2 17 (L) 07/11/2018     Lab Results  Component Value Date   ALT 141 (H) 07/11/2018   AST 154 (H) 07/11/2018   ALKPHOS 205 (H) 07/11/2018   BILITOT 0.2 (L) 07/11/2018    HIV 1 RNA Quant (copies/mL)  Date Value  06/20/2018 <20 DETECTED (A)  04/02/2018 <20 DETECTED (A)  12/17/2017 26 (H)   CD4 T Cell Abs (/uL)  Date Value  06/20/2018 <35 (L)  04/02/2018 10 (L)  12/17/2017 50 (L)   Microbiology: Recent Results (from the past 240 hour(s))  Blood Culture (routine x 2)     Status: None   Collection Time: 07/04/18 12:30 PM  Result Value Ref Range Status   Specimen Description   Final    BLOOD RIGHT ANTECUBITAL Performed at [facility], [place] [address] [place], [place] [address]    Special Requests   Final    BOTTLES DRAWN AEROBIC AND ANAEROBIC Blood Culture adequate volume Performed at [place] [address] [place], [place] [address]    Culture   Final    NO GROWTH 5 DAYS Performed at [facility], [place] [address] [place], [place] [address]    Report Status 07/09/2018 FINAL  Final  Blood Culture (routine x 2)     Status: None   Collection Time: 07/04/18 12:40 PM  Result Value Ref Range Status   Specimen Description   Final    BLOOD LEFT ANTECUBITAL Performed at [place] [address] [place], [place] [address]    Special Requests   Final    BOTTLES DRAWN AEROBIC AND ANAEROBIC Blood Culture adequate volume Performed at [place] [address], [place], [place] [address]    Culture   Final    NO GROWTH 5 DAYS Performed at [facility], [place] [address] [place], [place] [address]    Report Status 07/09/2018 FINAL  Final  SARS Coronavirus 2 (CEPHEID- Performed in [place] hospital lab), Hosp Order     Status: None   Collection Time: 07/04/18  1:50 PM  Result Value Ref Range Status   SARS Coronavirus 2 NEGATIVE NEGATIVE Final    Comment: (NOTE) If result is NEGATIVE SARS-CoV-2 target  nucleic acids are NOT DETECTED. The SARS-CoV-2 RNA is generally detectable in upper and lower  respiratory specimens during the acute phase of infection. The lowest  concentration of SARS-CoV-2 viral copies this assay can detect is 250  copies / mL. A negative result does not preclude SARS-CoV-2 infection  and should not be used as the sole basis for treatment or other  patient management decisions.  A negative result may occur with  improper specimen collection / handling, submission of specimen other  than nasopharyngeal swab, presence of  viral mutation(s) within the  areas targeted by this assay, and inadequate number of viral copies  (<250 copies / mL). A negative result must be combined with clinical  observations, patient history, and epidemiological information. If result is POSITIVE SARS-CoV-2 target nucleic acids are DETECTED. The SARS-CoV-2 RNA is generally detectable in upper and lower  respiratory specimens dur ing the acute phase of infection.  Positive  results are indicative of active infection with SARS-CoV-2.  Clinical  correlation with patient history and other diagnostic information is  necessary to determine patient infection status.  Positive results do  not rule out bacterial infection or co-infection with other viruses. If result is PRESUMPTIVE POSTIVE SARS-CoV-2 nucleic acids MAY BE PRESENT.   A presumptive positive result was obtained on the submitted specimen  and confirmed on repeat testing.  While 2019 novel coronavirus  (SARS-CoV-2) nucleic acids may be present in the submitted sample  additional confirmatory testing may be necessary for epidemiological  and / or clinical management purposes  to differentiate between  SARS-CoV-2 and other Sarbecovirus currently known to infect humans.  If clinically indicated additional testing with an alternate test  methodology [phone]) is advised. The SARS-CoV-2 RNA is generally  detectable in upper and lower  respiratory sp ecimens during the acute  phase of infection. The expected result is Negative. Fact Sheet for Patients:  StrictlyIdeas.no Fact Sheet for Healthcare Providers: BankingDealers.co.za This test is not yet approved or cleared by the [place] FDA and has been authorized for detection and/or diagnosis of SARS-CoV-2 by FDA under an Emergency Use Authorization (EUA).  This EUA will remain in effect (meaning this test can be used) for the duration of the COVID-19 declaration under Section 564(b)(1) of the Act, 21 U.S.C. section 360bbb-3(b)(1), unless the authorization is terminated or revoked sooner. Performed at [facility], [place] [address] [place], [place] [address]   MRSA PCR Screening     Status: None   Collection Time: 07/04/18  4:21 PM  Result Value Ref Range Status   MRSA by PCR NEGATIVE NEGATIVE Final    Comment:        The GeneXpert MRSA Assay (FDA approved for NASAL specimens only), is one component of a comprehensive MRSA colonization surveillance program. It is not intended to diagnose MRSA infection nor to guide or monitor treatment for MRSA infections. Performed at [facility], [place] [address] [place], [place] [address]   Aerobic/Anaerobic Culture (surgical/deep wound)     Status: None   Collection Time: 07/05/18 11:06 AM  Result Value Ref Range Status   Specimen Description   Final    BONE MARROW Performed at [place] [address], [place], [place] [address]    Special Requests   Final    NONE Performed at [facility] [place] [address] [place], [place] [address]    Gram Stain   Final    RARE WBC PRESENT,BOTH PMN AND MONONUCLEAR NO ORGANISMS SEEN    Culture   Final    No growth aerobically or anaerobically. Performed at [facility], [place] [address], [place],  [address]    Report Status 07/10/2018  FINAL  Final  Acid Fast Smear (AFB)     Status: None   Collection Time: 07/05/18 11:06 AM  Result Value Ref Range Status   AFB Specimen Processing Comment  Final    Comment: Tissue Grinding and Digestion/Decontamination   Acid Fast Smear Negative  Final  Comment: (NOTE) Performed At: [facility] [place], [place] [id number] Rush Farmer MD [id number]    Source (AFB) BONE MARROW  Final    Comment: Performed at [place] [address] [place], [place] [address]  Fungus Culture With Stain     Status: None (Preliminary result)   Collection Time: 07/05/18 11:06 AM  Result Value Ref Range Status   Fungus Stain Final report  Final    Comment: (NOTE) Performed At: [facility] [place], [place] [id number] Rush Farmer MD [id number]    Fungus (Mycology) Culture PENDING  Incomplete   Fungal Source BONE MARROW  Final    Comment: Performed at [facility], [place] [address] [place], [place] [address]  Fungus Culture Result     Status: None   Collection Time: 07/05/18 11:06 AM  Result Value Ref Range Status   Result 1 Comment  Final    Comment: (NOTE) KOH/Calcofluor preparation:  no fungus observed. Performed At: [facility] [address] [place], [place] [id number] Rush Farmer MD [id number]     Michel Bickers, [place] for [place] [phone] pager   [phone] cell 07/11/2018, 10:44 AM

## 2018-07-11 NOTE — Telephone Encounter (Signed)
The patient called to stating she is currently in the hospital. Informed the appointment was cancelled.

## 2018-07-11 NOTE — H&P (View-Only) (Signed)
NAMEShiah Short, MRN:  [id number], DOB:  02-20-1985, LOS: 7 ADMISSION DATE:  07/04/2018, CONSULTATION DATE:  07/11/2018 REFERRING MD:  Verlon Au, CHIEF COMPLAINT: Pneumonia  Brief History   33 year old lady diagnosed with HIV on antiretrovirals Has been on treatment for PJP with Bactrim and steroids Concern for sarcoidosis versus opportunistic infection Had been on steroids  Significant concerns with a thrombocytopenia and anemia-relationship to dapsone or Bactrim  History of present illness   Readmitted 5/28 with fevers Started on antibiotics for pneumonia  Past Medical History   Past Medical History:  Diagnosis Date  . Acute hyponatremia 06/03/2017  . Anemia   . CAP (community acquired pneumonia) 06/03/2017  . Community acquired pneumonia 06/05/2017  . Facial dermatitis 06/03/2017  . HIV (human immunodeficiency virus infection) ([place])   . Pleurisy 06/03/2017  . Pneumonia 06/06/2017  . 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 ([place])   . Thrush of mouth and esophagus ([place])   . UTI (urinary tract infection)    Significant Hospital Events     Consults:  PCCM 07/09/2018  Procedures:  Recent bone marrow biopsy-5/29  Significant Diagnostic Tests:    Micro Data:  Blood culture 4/28>>  Antimicrobials:  Azithromycin 5/28 Cefepime 5/28 Bactrim 5/29 Vancomycin 5/28 Biktarvy   Interim history/subjective:  Feels breathing is stable Comfortable Objective   Blood pressure 104/80, pulse 93, temperature 98.1 F (36.7 C), temperature source Oral, resp. rate 20, height 5' 2"  (1.575 m), weight 61 kg, SpO2 97 %.        Intake/Output Summary (Last 24 hours) at 07/11/2018 1043 Last data filed at 07/11/2018 1275 Gross per 24 hour  Intake 600 ml  Output 300 ml  Net 300 ml   Filed Weights   07/04/18 1313 07/06/18 1100  Weight: 56.7 kg 61 kg    Examination: General: Young lady, does not appear to be in distress HENT: Moist oral mucosa Lungs: Clear breath  sounds Cardiovascular: S1-S2 appreciated Abdomen: Bowel sounds appreciated Extremities: No clubbing, no edema Neuro: Alert and oriented x3 GU:   Resolved Hospital Problem list     Assessment & Plan:   Abnormal CT scan of the chest showing multifocal infiltrates, interstitial process -Recent pneumonia -Concern for BJP, sarcoidosis -Bronchoscopy scheduled for tomorrow at 10 AM -Ultrasound-guided lymph node aspiration scheduled  Mediastinal adenopathy -May be secondary to pneumonic process -Concern for immunocompromised pneumonia -Sarcoidosis is less likely as she did not have any significant previous history-less likely to present with fibrotic sarcoidosis without significant history in the past  AIDS -Defer to ID  Best practice:   Disposition: Plan for bronchoscopy Transfuse prior to bronchoscopy as platelet count is still too low for any biopsies  Labs   CBC: Recent Labs  Lab 07/04/18 1254  07/05/18 0242 07/05/18 1621 07/07/18 0528 07/08/18 0511 07/08/18 1458 07/09/18 0656 07/10/18 1001 07/11/18 0848  WBC 6.6   < > 9.7 9.3 6.7 6.6  --  9.1 9.1 10.5  NEUTROABS 4.1  --  5.5 5.2  --   --   --   --   --  6.2  HGB 5.1*   < > 8.0* 8.0* 7.2* 6.6* 9.2* 11.4* 10.8* 11.2*  HCT 16.8*   < > 24.8* 25.6* 23.1* 20.2* 28.2* 34.3* 32.1* 33.9*  MCV 107.0*   < > 95.8 98.5 98.7 94.8  --  94.2 92.0 98.0  PLT 14*   < > 9*  9* 39* 21* 19*  --  25* 37* 28*   < > =  values in this interval not displayed.    Basic Metabolic Panel: Recent Labs  Lab 07/04/18 1254 07/05/18 0242 07/09/18 0656 07/10/18 1001 07/11/18 0848  NA 128* 130* 131* 130* 130*  K 4.1 4.3 5.1 5.2* 4.7  CL 94* 101 108 106 106  CO2 23 21* 17* 16* 17*  GLUCOSE 116* 132* 102* 131* 184*  BUN 26* 14 11 10 11   CREATININE 1.13* 0.82 0.80 0.78 0.73  CALCIUM 7.4* 7.1* 7.9* 7.9* 8.1*   GFR: Estimated Creatinine Clearance: 87.7 mL/min (by C-G formula based on SCr of 0.73 mg/dL). Recent Labs  Lab 07/04/18 1252   07/04/18 1433  07/08/18 0511 07/09/18 0656 07/10/18 1001 07/11/18 0848  WBC  --    < >  --    < > 6.6 9.1 9.1 10.5  LATICACIDVEN 2.6*  --  2.2*  --   --   --   --   --    < > = values in this interval not displayed.    Liver Function Tests: Recent Labs  Lab 07/04/18 1254 07/05/18 0242 07/11/18 0848  AST 39 32 154*  ALT 12 12 141*  ALKPHOS 153* 104 205*  BILITOT 1.4* 0.8 0.2*  PROT 7.3 6.2* 7.0  ALBUMIN 2.4* 2.0* 2.6*   No results for input(s): LIPASE, AMYLASE in the last 168 hours. No results for input(s): AMMONIA in the last 168 hours.  ABG    Component Value Date/Time   HCO3 16.9 (L) 05/28/2018 1651   ACIDBASEDEF 7.1 (H) 05/28/2018 1651   O2SAT 72.7 05/28/2018 1651     Coagulation Profile: Recent Labs  Lab 07/05/18 0242 07/11/18 0848  INR 1.4* 1.1    Cardiac Enzymes: No results for input(s): CKTOTAL, CKMB, CKMBINDEX, TROPONINI in the last 168 hours.  HbA1C: No results found for: HGBA1C  CBG: No results for input(s): GLUCAP in the last 168 hours.  Review of Systems:   Review of Systems  HENT: Negative.   Eyes: Negative.   Respiratory: Positive for cough.   Cardiovascular: Negative for chest pain.  Gastrointestinal: Negative.   Genitourinary: Negative.   Skin: Negative.      Past Medical History  She,  has a past medical history of Acute hyponatremia (06/03/2017), Anemia, CAP (community acquired pneumonia) (06/03/2017), Community acquired pneumonia (06/05/2017), Facial dermatitis (06/03/2017), HIV (human immunodeficiency virus infection) ([place]), Pleurisy (06/03/2017), Pneumonia (06/06/2017), 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 ([place]), Thrush of mouth and esophagus ([place]), and UTI (urinary tract infection).   Surgical History    Past Surgical History:  Procedure Laterality Date  . NO PAST SURGERIES       Social History   reports that she quit smoking about 18 months ago. Her smoking use included cigarettes and cigars. She has a 2.00  pack-year smoking history. She has never used smokeless tobacco. She reports current alcohol use. She reports that she does not use drugs.   Family History   Her family history includes Asthma in her paternal grandmother; Healthy in her father and mother.   Allergies Allergies  Allergen Reactions  . Heparin      Home Medications  Prior to Admission medications   Medication Sig Start Date End Date Taking? Authorizing Provider

## 2018-07-12 ENCOUNTER — Inpatient Hospital Stay (HOSPITAL_COMMUNITY): Payer: 59 | Admitting: Anesthesiology

## 2018-07-12 ENCOUNTER — Encounter (HOSPITAL_COMMUNITY)
Admission: EM | Disposition: A | Discharge status: Home / Self Care | Payer: Self-pay | Source: Home / Self Care | Attending: Family Medicine

## 2018-07-12 ENCOUNTER — Inpatient Hospital Stay (HOSPITAL_COMMUNITY): Payer: 59

## 2018-07-12 ENCOUNTER — Ambulatory Visit (HOSPITAL_COMMUNITY): Payer: 59 | Attending: Obstetrics & Gynecology

## 2018-07-12 ENCOUNTER — Encounter (HOSPITAL_COMMUNITY): Payer: Self-pay | Admitting: *Deleted

## 2018-07-12 HISTORY — PX: BRONCHIAL WASHINGS: SHX5105

## 2018-07-12 HISTORY — PX: ENDOBRONCHIAL ULTRASOUND: SHX5096

## 2018-07-12 HISTORY — PX: FINE NEEDLE ASPIRATION BIOPSY: CATH118315

## 2018-07-12 HISTORY — PX: BIOPSY: SHX5522

## 2018-07-12 HISTORY — PX: VIDEO BRONCHOSCOPY: SHX5072

## 2018-07-12 LAB — COMPREHENSIVE METABOLIC PANEL
ALT: 151 U/L — ABNORMAL HIGH (ref 0–44)
AST: 119 U/L — ABNORMAL HIGH (ref 15–41)
Albumin: 2.8 g/dL — ABNORMAL LOW (ref 3.5–5.0)
Alkaline Phosphatase: 197 U/L — ABNORMAL HIGH (ref 38–126)
Anion gap: 6 (ref 5–15)
BUN: 9 mg/dL (ref 6–20)
CO2: 20 mmol/L — ABNORMAL LOW (ref 22–32)
Calcium: 8.3 mg/dL — ABNORMAL LOW (ref 8.9–10.3)
Chloride: 105 mmol/L (ref 98–111)
Creatinine, Ser: 0.68 mg/dL (ref 0.44–1.00)
GFR calc Af Amer: >60 mL/min (ref >60)
GFR calc non Af Amer: >60 mL/min (ref >60)
Glucose, Bld: 149 mg/dL — ABNORMAL HIGH (ref 70–99)
Potassium: 4.4 mmol/L (ref 3.5–5.1)
Sodium: 131 mmol/L — ABNORMAL LOW (ref 135–145)
Total Bilirubin: 0.4 mg/dL (ref 0.3–1.2)
Total Protein: 7.4 g/dL (ref 6.5–8.1)

## 2018-07-12 LAB — CBC WITH DIFFERENTIAL/PLATELET
Abs Immature Granulocytes: 0.7 10*3/uL — ABNORMAL HIGH (ref 0.00–0.07)
Basophils Absolute: 0 10*3/uL (ref 0.0–0.1)
Basophils Relative: 0 %
Eosinophils Absolute: 0 10*3/uL (ref 0.0–0.5)
Eosinophils Relative: 0 %
HCT: 35.2 % — ABNORMAL LOW (ref 36.0–46.0)
Hemoglobin: 11 g/dL — ABNORMAL LOW (ref 12.0–15.0)
Immature Granulocytes: 6 %
Lymphocytes Relative: 16 %
Lymphs Abs: 1.9 10*3/uL (ref 0.7–4.0)
MCH: 30.6 pg (ref 26.0–34.0)
MCHC: 31.3 g/dL (ref 30.0–36.0)
MCV: 97.8 fL (ref 80.0–100.0)
Monocytes Absolute: 2.3 10*3/uL — ABNORMAL HIGH (ref 0.1–1.0)
Monocytes Relative: 19 %
Neutro Abs: 7 10*3/uL (ref 1.7–7.7)
Neutrophils Relative %: 59 %
Platelets: 56 10*3/uL — ABNORMAL LOW (ref 150–400)
RBC: 3.6 MIL/uL — ABNORMAL LOW (ref 3.87–5.11)
RDW: 19.1 % — ABNORMAL HIGH (ref 11.5–15.5)
WBC: 12 10*3/uL — ABNORMAL HIGH (ref 4.0–10.5)
nRBC: 0.2 % (ref 0.0–0.2)

## 2018-07-12 LAB — PREPARE PLATELET PHERESIS: Unit division: 0

## 2018-07-12 LAB — PROTIME-INR
INR: 1.1 (ref 0.8–1.2)
Prothrombin Time: 14 seconds (ref 11.4–15.2)

## 2018-07-12 LAB — CD4/CD8 (T-HELPER/T-SUPPRESSOR CELL)
CD4 absolute: 41 /uL — ABNORMAL LOW (ref 400–1790)
CD4%: 2 % — ABNORMAL LOW (ref 33–65)
CD8 T Cell Abs: 821 /uL (ref 190–1000)
CD8tox: 49 % — ABNORMAL HIGH (ref 12–40)
Ratio: 0.05 — ABNORMAL LOW (ref 1.0–3.0)
Total lymphocyte count: 1694 /uL (ref 1000–4000)

## 2018-07-12 LAB — BPAM PLATELET PHERESIS
Blood Product Expiration Date: 202006052359
ISSUE DATE / TIME: 202006041257
Unit Type and Rh: 7300

## 2018-07-12 LAB — HAPTOGLOBIN: Haptoglobin: 179 mg/dL (ref 33–278)

## 2018-07-12 LAB — BODY FLUID CELL COUNT WITH DIFFERENTIAL: Total Nucleated Cell Count, Fluid: 350 cu mm (ref 0–1000)

## 2018-07-12 SURGERY — ENDOBRONCHIAL ULTRASOUND (EBUS)
Anesthesia: General

## 2018-07-12 MED ORDER — PHENYLEPHRINE 40 MCG/ML (10ML) SYRINGE FOR IV PUSH (FOR BLOOD PRESSURE SUPPORT)
PREFILLED_SYRINGE | INTRAVENOUS | Status: DC | PRN
  Administered 2018-07-12 (×2): 40 ug via INTRAVENOUS

## 2018-07-12 MED ORDER — MIDAZOLAM HCL 2 MG/2ML IJ SOLN
INTRAMUSCULAR | Status: AC
  Filled 2018-07-12: qty 2

## 2018-07-12 MED ORDER — LACTATED RINGERS IV SOLN
INTRAVENOUS | Status: DC
  Administered 2018-07-12: 10:00:00 via INTRAVENOUS

## 2018-07-12 MED ORDER — PROPOFOL 10 MG/ML IV BOLUS
INTRAVENOUS | Status: DC | PRN
  Administered 2018-07-12: 150 mg via INTRAVENOUS
  Administered 2018-07-12: 50 mg via INTRAVENOUS

## 2018-07-12 MED ORDER — PROPOFOL 10 MG/ML IV BOLUS
INTRAVENOUS | Status: AC
  Filled 2018-07-12: qty 20

## 2018-07-12 MED ORDER — SUGAMMADEX SODIUM 200 MG/2ML IV SOLN
INTRAVENOUS | Status: DC | PRN
  Administered 2018-07-12: 150 mg via INTRAVENOUS

## 2018-07-12 MED ORDER — CEPASTAT 14.5 MG MT LOZG: 1.0000 | LOZENGE | OROMUCOSAL | Status: DC | PRN

## 2018-07-12 MED ORDER — MENTHOL 3 MG MT LOZG
1.0000 | LOZENGE | OROMUCOSAL | Status: DC | PRN
  Filled 2018-07-12: qty 9

## 2018-07-12 MED ORDER — LIDOCAINE 2% (20 MG/ML) 5 ML SYRINGE
INTRAMUSCULAR | Status: DC | PRN
  Administered 2018-07-12: 50 mg via INTRAVENOUS

## 2018-07-12 MED ORDER — FENTANYL CITRATE (PF) 100 MCG/2ML IJ SOLN
INTRAMUSCULAR | Status: AC
  Filled 2018-07-12: qty 2

## 2018-07-12 MED ORDER — FENTANYL CITRATE (PF) 100 MCG/2ML IJ SOLN
INTRAMUSCULAR | Status: DC | PRN
  Administered 2018-07-12: 100 ug via INTRAVENOUS

## 2018-07-12 MED ORDER — ROCURONIUM BROMIDE 10 MG/ML (PF) SYRINGE
PREFILLED_SYRINGE | INTRAVENOUS | Status: DC | PRN
  Administered 2018-07-12: 50 mg via INTRAVENOUS
  Administered 2018-07-12: 10 mg via INTRAVENOUS

## 2018-07-12 MED ORDER — ONDANSETRON HCL 4 MG/2ML IJ SOLN
INTRAMUSCULAR | Status: DC | PRN
  Administered 2018-07-12: 4 mg via INTRAVENOUS

## 2018-07-12 MED ORDER — MIDAZOLAM HCL 5 MG/5ML IJ SOLN
INTRAMUSCULAR | Status: DC | PRN
  Administered 2018-07-12: 2 mg via INTRAVENOUS

## 2018-07-12 NOTE — Anesthesia Postprocedure Evaluation (Signed)
Anesthesia Post Note  Patient: Product/process development scientist  Procedure(s) Performed: ENDOBRONCHIAL ULTRASOUND (N/A ) VIDEO BRONCHOSCOPY WITHOUT FLUORO (N/A ) FINE NEEDLE ASPIRATION BIOPSY     Patient location during evaluation: PACU Anesthesia Type: General Level of consciousness: awake and alert Pain management: pain level controlled Vital Signs Assessment: post-procedure vital signs reviewed and stable Respiratory status: spontaneous breathing, nonlabored ventilation and respiratory function stable Cardiovascular status: blood pressure returned to baseline and stable Postop Assessment: no apparent nausea or vomiting Anesthetic complications: no    Last Vitals:  Vitals:   07/12/18 1150 07/12/18 1200  BP: 103/71 91/68  Pulse: 100 96  Resp: 13 16  Temp:    SpO2: 99% 100%    Last Pain:  Vitals:   07/12/18 1200  TempSrc:   PainSc: 0-No pain                 Audry Pili

## 2018-07-12 NOTE — Progress Notes (Signed)
Video Bronchoscopy post EBUS Procedure: Interventional bronchial washing obtained Interventional bronchial biopsy obtained   Patient tolerated procedure well, remained in Endoscopy suite for recovery.

## 2018-07-12 NOTE — CV Procedure (Signed)
Name:  Alexea Blase MRN:  [id number] DOB:  Mar 24, 1985  PROCEDURE NOTE  Procedure(s): Flexible bronchoscopy [phone]) Bronchial alveolar lavage [phone]) of the right upper lobe Transbronchial lung biopsy, single lobe (32440) of the right upper lobe Endobronchial ultrasound (10272) Transbronchial needle aspiration (53664) of the4R Transbronchial needle aspiration, additional lobe (40347) of the 7  Indications:  Hilar / mediastinal lymphadenopathy.  Consent:  Procedure, benefits, risks and alternatives discussed.  Questions answered.  Consent obtained.  Anesthesia:  General endotracheal.  Procedure summary:  Appropriate equipment was assembled.  The patient was brought to the operating room and identified as [name].  Safety timeout was performed. The patient was placed supine on the operating table, airway established and general anesthesia administered by Anesthesia team.   After the appropriate level of anesthesia was assured, flexible video bronchoscope was lubricated and inserted through the endotracheal tube.   Airway examination was performed bilaterally to subsegmental level.  Minimal clear secretions were noted, mucosa appeared normal and no endobronchial lesions were identified.  Bronchial alveolar lavage of the right middle lobe was performed with 100 mL of normal saline and return of 75 mL of fluid, after which the bronchoscope was withdrawn.  Endobronchial ultrasound video bronchoscope was then lubricated and inserted through the endotracheal tube. Surveillance of the mediastinal and and bilateral hilar lymph node stations was performed.  Pathologically enlarged lymph nodes were noted.  Endobronchial ultrasound guided transbronchial needle aspiration of 4R node (passes 4), 7 (passes 3) was performed, after which EBUS bronchoscope was withdrawn.  Flexible video bronchoscope was used again to perform endobronchial mucosal biopsies and transbronchial biopsies right upper  lobe.  After hemostasis was assure, the bronchoscope was withdrawn.  The patient was extubated in operating room and transferred to PACU. Post-procedure chest x-ray was ordered.  Specimens sent: Bronchial alveolar lavage specimen of the right upper lobe for cell count (including CD4/CD8 assessment), microbiology and cytology.  Complications:  No immediate complications were noted.  Hemodynamic parameters and oxygenation remained stable throughout the procedure.  Estimated blood loss:  Less then 10 mL.  Sherrilyn Rist, MD [place], PCCM Cell: [phone]  07/12/2018, 11:42 AM

## 2018-07-12 NOTE — Anesthesia Preprocedure Evaluation (Addendum)
Anesthesia Evaluation  Patient identified by MRN, date of birth, ID band Patient awake    Reviewed: Allergy & Precautions, NPO status , Patient's Chart, lab work & pertinent test results  History of Anesthesia Complications Negative for: history of anesthetic complications  Airway Mallampati: II  TM Distance: >3 FB Neck ROM: Full    Dental  (+) Dental Advisory Given, Teeth Intact   Pulmonary former smoker,   Possible sarcoidosis    breath sounds clear to auscultation       Cardiovascular negative cardio ROS   Rhythm:Regular Rate:Normal     Neuro/Psych negative neurological ROS  negative psych ROS   GI/Hepatic negative GI ROS,  Elevated LFTs    Endo/Other   Hyponatremia   Renal/GU negative Renal ROS     Musculoskeletal negative musculoskeletal ROS (+)   Abdominal   Peds  Hematology  (+) anemia , HIV,  Thrombocytopenia - PLT 56k    Anesthesia Other Findings   Reproductive/Obstetrics                          Anesthesia Physical Anesthesia Plan  ASA: III  Anesthesia Plan: General   Post-op Pain Management:    Induction: Intravenous  PONV Risk Score and Plan: 3 and Treatment may vary due to age or medical condition, Ondansetron and Midazolam  Airway Management Planned: Oral ETT  Additional Equipment: None  Intra-op Plan:   Post-operative Plan: Extubation in OR  Informed Consent: I have reviewed the patients History and Physical, chart, labs and discussed the procedure including the risks, benefits and alternatives for the proposed anesthesia with the patient or authorized representative who has indicated his/her understanding and acceptance.     Dental advisory given  Plan Discussed with: CRNA and Anesthesiologist  Anesthesia Plan Comments:        Anesthesia Quick Evaluation

## 2018-07-12 NOTE — Discharge Instructions (Signed)
Discharge to medical floor

## 2018-07-12 NOTE — Progress Notes (Signed)
Received call from endo that patient needs 2nd COVID testing. Stat ordered placed. Night RN called  ICU and 4th floor, both floors told RN they are unable to do testing at this time. Called ED, Anderson Malta stated she will send a nurse to do COVID testing as soon as she can. Will pass on the day RN.

## 2018-07-12 NOTE — Anesthesia Procedure Notes (Addendum)
Procedure Name: Intubation Date/Time: 07/12/2018 10:23 AM Performed by: Anne Fu, CRNA Pre-anesthesia Checklist: Patient identified, Emergency Drugs available, Suction available, Patient being monitored and Timeout performed Patient Re-evaluated:Patient Re-evaluated prior to induction Oxygen Delivery Method: Circle system utilized Preoxygenation: Pre-oxygenation with 100% oxygen Induction Type: IV induction Ventilation: Mask ventilation without difficulty Laryngoscope Size: Mac and 4 Grade View: Grade I Tube type: Oral Tube size: 8.0 mm Number of attempts: 1 Airway Equipment and Method: Stylet Placement Confirmation: ETT inserted through vocal cords under direct vision,  positive ETCO2 and breath sounds checked- equal and bilateral Secured at: 21 cm Tube secured with: Tape Dental Injury: Teeth and Oropharynx as per pre-operative assessment

## 2018-07-12 NOTE — Interval H&P Note (Signed)
History and Physical Interval Note:  07/12/2018 11:53 AM  Jenessa Kary  has presented today for surgery, with the diagnosis of suspected sarcoidosis.  The various methods of treatment have been discussed with the patient and family. After consideration of risks, benefits and other options for treatment, the patient has consented to  Procedure(s): ENDOBRONCHIAL ULTRASOUND (N/A) VIDEO BRONCHOSCOPY WITHOUT FLUORO (N/A) FINE NEEDLE ASPIRATION BIOPSY as a surgical intervention.  The patient's history has been reviewed, patient examined, no change in status, stable for surgery.  I have reviewed the patient's chart and labs.  Questions were answered to the patient's satisfaction.     Adewale A Olalere

## 2018-07-12 NOTE — Transfer of Care (Signed)
Immediate Anesthesia Transfer of Care Note  Patient: Krystal Short  Procedure(s) Performed: Procedure(s): ENDOBRONCHIAL ULTRASOUND (N/A) VIDEO BRONCHOSCOPY WITHOUT FLUORO (N/A) FINE NEEDLE ASPIRATION BIOPSY  Patient Location: PACU  Anesthesia Type:General  Level of Consciousness:  sedated, patient cooperative and responds to stimulation  Airway & Oxygen Therapy:Patient Spontanous Breathing and Patient connected to face mask oxgen  Post-op Assessment:  Report given to PACU RN and Post -op Vital signs reviewed and stable  Post vital signs:  Reviewed and stable  Last Vitals:  Vitals:   07/12/18 0500 07/12/18 0959  BP: 109/76 103/74  Pulse: 90 98  Resp: 18 20  Temp: 36.8 C 36.8 C  SpO2: 03% 88%    Complications: No apparent anesthesia complications

## 2018-07-12 NOTE — Progress Notes (Signed)
NAMEMazey Short, MRN:  [id number], DOB:  02/01/1986, LOS: 8 ADMISSION DATE:  07/04/2018, CONSULTATION DATE:  07/11/2018 REFERRING MD:  Verlon Au, CHIEF COMPLAINT: Pneumonia  Brief History   33 year old lady diagnosed with HIV on antiretrovirals Has been on treatment for PJP with Bactrim and steroids Concern for sarcoidosis versus opportunistic infection Had been on steroids  Significant concerns with a thrombocytopenia and anemia-relationship to dapsone or Bactrim  She is status post bronchoscopy today 07/12/2018-tolerated well  History of present illness   Readmitted 5/28 with fevers Started on antibiotics for pneumonia  Past Medical History   Past Medical History:  Diagnosis Date  . Acute hyponatremia 06/03/2017  . Anemia   . CAP (community acquired pneumonia) 06/03/2017  . Community acquired pneumonia 06/05/2017  . Facial dermatitis 06/03/2017  . HIV (human immunodeficiency virus infection) ([place])   . Pleurisy 06/03/2017  . Pneumonia 06/06/2017  . 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 ([place])   . Thrush of mouth and esophagus ([place])   . UTI (urinary tract infection)    Significant Hospital Events   Bronchoscopy, EBUS 07/12/2018  Consults:  PCCM 07/09/2018  Procedures:  Recent bone marrow biopsy-5/29  Significant Diagnostic Tests:  Bronchoscopy  Micro Data:  Blood culture 4/28>> BAL Fungal cultures Cytology from lymph node aspiration  Antimicrobials:  Azithromycin 5/28 Cefepime 5/28 Bactrim 5/29 Vancomycin 5/28 Biktarvy   Interim history/subjective:  Feels breathing is stable, breathing feels better On room air at present Objective   Blood pressure 96/71, pulse 91, temperature 97.7 F (36.5 C), temperature source Oral, resp. rate 20, height _0  (1.575 m), weight 61 kg, SpO2 98 %.        Intake/Output Summary (Last 24 hours) at 07/12/2018 1643 Last data filed at 07/12/2018 1151 Gross per 24 hour  Intake 1140 ml  Output 10 ml  Net 1130 ml    Filed Weights   07/04/18 1313 07/06/18 1100 07/12/18 0959  Weight: 56.7 kg 61 kg 61 kg    Examination: Young lady, does not appear to be in distress Moist oral mucosa Clear breath sounds S1-S2 appreciated Bowel sounds appreciated No clubbing, no edema Alert and oriented x3  Resolved Hospital Problem list     Assessment & Plan:   Abnormal CT scan of the chest showing multifocal infiltrates, interstitial process -Recent pneumonia -Concern for PJP, sarcoidosis -Status post bronchoscopy, aspiration of lymph nodes, biopsy right upper lobe -Follow cell count, follow CD4/8 ratio, histology  Mediastinal adenopathy may be secondary to pneumonic process -Concern for immunocompromised pneumonia -Sarcoidosis less likely-follow biopsy  AIDS -Defer to ID   Best practice:   Disposition: Plan for bronchoscopy Transfuse prior to bronchoscopy as platelet count is still too low for any biopsies  Labs   CBC: Recent Labs  Lab 07/09/18 0656 07/10/18 1001 07/11/18 0848 07/11/18 1109 07/12/18 0828  WBC 9.1 9.1 10.5 10.7* 12.0*  NEUTROABS  --   --  6.2  --  7.0  HGB 11.4* 10.8* 11.2* 10.9* 11.0*  HCT 34.3* 32.1* 33.9* 34.8* 35.2*  MCV 94.2 92.0 98.0 99.7 97.8  PLT 25* 37* 28* 27* 56*    Basic Metabolic Panel: Recent Labs  Lab 07/09/18 0656 07/10/18 1001 07/11/18 0848 07/12/18 0812  NA 131* 130* 130* 131*  K 5.1 5.2* 4.7 4.4  CL 108 106 106 105  CO2 17* 16* 17* 20*  GLUCOSE 102* 131* 184* 149*  BUN _1 CREATININE 0.80 0.78 0.73 0.68  CALCIUM 7.9* 7.9* 8.1*  8.3*   GFR: Estimated Creatinine Clearance: 87.7 mL/min (by C-G formula based on SCr of 0.68 mg/dL). Recent Labs  Lab 07/10/18 1001 07/11/18 0848 07/11/18 1109 07/12/18 0828  WBC 9.1 10.5 10.7* 12.0*    Liver Function Tests: Recent Labs  Lab 07/11/18 0848 07/12/18 0812  AST 154* 119*  ALT 141* 151*  ALKPHOS 205* 197*  BILITOT 0.2* 0.4  PROT 7.0 7.4  ALBUMIN 2.6* 2.8*   No results for  input(s): LIPASE, AMYLASE in the last 168 hours. No results for input(s): AMMONIA in the last 168 hours.  ABG    Component Value Date/Time   HCO3 16.9 (L) 05/28/2018 1651   ACIDBASEDEF 7.1 (H) 05/28/2018 1651   O2SAT 72.7 05/28/2018 1651     Coagulation Profile: Recent Labs  Lab 07/11/18 0848 07/11/18 1109 07/12/18 0812  INR 1.1 1.1 1.1    Cardiac Enzymes: No results for input(s): CKTOTAL, CKMB, CKMBINDEX, TROPONINI in the last 168 hours.  HbA1C: No results found for: HGBA1C  CBG: No results for input(s): GLUCAP in the last 168 hours.  Review of Systems:   Review of Systems  HENT: Negative.   Eyes: Negative.   Respiratory: Negative for cough.   Cardiovascular: Negative for chest pain and orthopnea.  Gastrointestinal: Negative.   Genitourinary: Negative.   Skin: Negative.      Past Medical History  She,  has a past medical history of Acute hyponatremia (06/03/2017), Anemia, CAP (community acquired pneumonia) (06/03/2017), Community acquired pneumonia (06/05/2017), Facial dermatitis (06/03/2017), HIV (human immunodeficiency virus infection) ([place]), Pleurisy (06/03/2017), Pneumonia (06/06/2017), 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 ([place]), Thrush of mouth and esophagus ([place]), and UTI (urinary tract infection).   Surgical History    Past Surgical History:  Procedure Laterality Date  . BIOPSY  07/12/2018   Procedure: BIOPSY;  Surgeon: Laurin Coder, MD;  Location: [facility] ENDOSCOPY;  Service: Endoscopy;;  . BRONCHIAL WASHINGS  07/12/2018   Procedure: BRONCHIAL WASHINGS;  Surgeon: Laurin Coder, MD;  Location: [facility];  Service: Endoscopy;;  . ENDOBRONCHIAL ULTRASOUND N/A 07/12/2018   Procedure: ENDOBRONCHIAL ULTRASOUND;  Surgeon: Laurin Coder, MD;  Location: [facility];  Service: Endoscopy;  Laterality: N/A;  . FINE NEEDLE ASPIRATION BIOPSY  07/12/2018   Procedure: FINE NEEDLE ASPIRATION BIOPSY;  Surgeon: Laurin Coder, MD;  Location: [facility]  ENDOSCOPY;  Service: Endoscopy;;  . NO PAST SURGERIES    . VIDEO BRONCHOSCOPY N/A 07/12/2018   Procedure: VIDEO BRONCHOSCOPY WITHOUT FLUORO;  Surgeon: Laurin Coder, MD;  Location: [facility];  Service: Endoscopy;  Laterality: N/A;     Social History   reports that she quit smoking about 18 months ago. Her smoking use included cigarettes and cigars. She has a 2.00 pack-year smoking history. She has never used smokeless tobacco. She reports current alcohol use. She reports that she does not use drugs.   Family History   Her family history includes Asthma in her paternal grandmother; Healthy in her father and mother.

## 2018-07-12 NOTE — Progress Notes (Addendum)
Patient ID: Krystal Short, female   DOB: 1985/03/08, 33 y.o.   MRN: [id number]          [facility]    Date of Admission:  07/04/2018            [name] remains afebrile.  She is currently out of her room for her bronchoscopy.  We will follow-up on Monday, 07/15/2018.  Please call my partner, Catalina Antigua [phone]), for any infectious disease questions this weekend.  Michel Bickers, MD [facility] [phone] pager   [phone] cell 07/12/2018, 10:01 AM

## 2018-07-12 NOTE — Progress Notes (Signed)
[facility] PROGRESS NOTE  Indica Marcott BHA:[id number] DOB: 08/30/1985 DOA: 07/04/2018 PCP: [name]   Narrative:  33 year old African-American female Tobacco abuse ~12/2016 Diagnosed with HIV 06/05/2017 when admitted screen +, HIV 1 antibody positive HIV-2 antibody negative CD4 was 10-he was started on Biktarvy and was set up for follow-up At that time she was thought to have suspected PJP, Was sent home on prednisone and Bactrim  Had 2 subsequent admissions 03/08/2018-03/10/2018  On her last admission 4/21 through 424 she had a CT scan which that was more suggestive of enlarged mediastinal bilateral hilar lymph nodes sarcoid and had a follow-up with Dr. Melvyn Novas of pulmonology--?she had resolving opportunistic infection with the caveat that having just finished prednisone patient may have look better than she really was-he wanted to follow-up with her around the beginning of June but she came back to the hospital before then  she was also referred in the outpatient setting to hematology because of thrombocytopenia and anemia-  Readmitted 5/28 fevers 103 sinus tach sodium 128 hemoglobin 5.1 platelet 14 Received vancomycin cefepime azithromycin and boluses of IV fluid  -- hematology was consulted here because hemoglobin was 5.1 with severe acute thrombocytopenia platelets in the 14 range Oncology feels not TTP, ID input has been helpful in addition to pulmonology delineating planning She had a bone marrow biopsy 5/29 showing trilineage forms hemophagocytic cells which was nonspecific    A & Plan  Atypical infection versus sarcoid Hoping bronchoscopy 6/5 results will give [place] a diagnosis Per ID counsel however prednisone at this time-off Bactrim--dapsone 6/3 She is not ready for discharge as she has been hospitalized multiple times without a clear answer and I feel she needs bronchoscopy reports prior to decisions about home Following all bronchoscopy labs  including acid-fast culture respiratory culture surgical pathology KOH prep and cytology Acute anemia thrombocytopenia Irregular periods on Depo-Provera since December 2019 for contraception, not menorrhagia Etiology still unclear-- Defer to oncology further management Till date 4 units PRBC, 3 units of platelets Hepatosplenomegaly noted on CT 6/4 Platelets are up but she received a transfusion yesterday Labs a.m. AKI on admission Metabolic acidosis non-anion gap  BUN/creatinine 26/1.1 and now resolved Continue bicarb 650 labs a.m. Slightly elevated LFTs Etiology unclear last check 5/29 LFTs were normal however pattern at this time shows equal preponderance AST ALT elevation with mild elevation of alk phos-. will scan with abdominal ultrasound HGSIL Needs outpatient follow-up patient states has this in early July and should keep appointment Do not know if this has some bearing on her low blood counts?  Might need transvaginal ultrasound  HIV AIDS patient's HIV quant is less than 20 however her CD4 counts are depressed and her CD8 counts are elevated-it is unclear how to interpret this in the setting of undifferentiated pancytopenia, fungal culture is negative AFB is negative, blood cultures are negative Defer to ID-continue Biktarvy, dapsone as above Former tobacco abuse and ~2018   DVT SCD code Status: Full communication: Patient alone disposition Plan:    Verlon Au, MD  [facility] [place] [email or web address] 7PM-7AM contact night coverage as above 07/12/2018, 3:45 PM  LOS: 8 days   Consultants:  ID  Hematology  Pulmonology  Procedures:  Multiple  Antimicrobials:  Dapsone Biktarvy  Interval history/Subjective:  Throat very sore-I went to see the patient earlier in the day but she had just gotten back from bronchoscopy and is more awake when I saw her at around 1400 Other than being sore she  has no other issues no diarrhea no thrush  Objective:  Vitals:   Vitals:   07/12/18 1220 07/12/18 1248  BP: 103/71 96/71  Pulse: (!) 103 91  Resp: 17 20  Temp:  97.7 F (36.5 C)  SpO2: 96% 98%    Exam:  Coherent no distress EOMI NCAT Back of throat seems slightly red no thrush Chest clear no added sound S1-S2 no murmur Abdomen soft slight distention No neurological findings    I have personally reviewed the following:  DATA  Labs:  Sodium 130-->131 potassium 5.2->4.7-->4.4  LFTs 119 AST, ALT 151  CO2 17-->20  Platelet count 19--25-->37-->27-->56 once again currently  Hemoglobin 5.1-->10.8-->11  WBC up from 10.7-12.0 with mild left shift  Imaging studies:  Multiple  Medical tests:  None  Decision to obtain old records:  Yes  Review and summation of old records:  He has extensively summarized  Scheduled Meds: . B-complex with vitamin C  1 tablet Oral Daily  . bictegravir-emtricitabine-tenofovir AF  1 tablet Oral Daily  . Chlorhexidine Gluconate Cloth  6 each Topical Q0600  . dapsone  100 mg Oral Daily  . folic acid  2 mg Oral Daily  . mouth rinse  15 mL Mouth Rinse BID  . predniSONE  40 mg Oral Q breakfast  . sodium bicarbonate  650 mg Oral Daily   Continuous Infusions:  Principal Problem:   Acute respiratory failure with hypoxia (HCC) Active Problems:   AIDS (acquired immune deficiency syndrome) (HCC)   Symptomatic anemia   Thrombocytopenia (HCC)   ASCUS with positive high risk HPV cervical   Pulmonary infiltrates   Hilar adenopathy   Hemophagocytosis present in bone marrow ([place])   LOS: 8 days

## 2018-07-13 LAB — COMPREHENSIVE METABOLIC PANEL
ALT: 113 U/L — ABNORMAL HIGH (ref 0–44)
AST: 57 U/L — ABNORMAL HIGH (ref 15–41)
Albumin: 2.8 g/dL — ABNORMAL LOW (ref 3.5–5.0)
Alkaline Phosphatase: 192 U/L — ABNORMAL HIGH (ref 38–126)
Anion gap: 8 (ref 5–15)
BUN: 8 mg/dL (ref 6–20)
CO2: 20 mmol/L — ABNORMAL LOW (ref 22–32)
Calcium: 8.1 mg/dL — ABNORMAL LOW (ref 8.9–10.3)
Chloride: 102 mmol/L (ref 98–111)
Creatinine, Ser: 0.64 mg/dL (ref 0.44–1.00)
GFR calc Af Amer: >60 mL/min (ref >60)
GFR calc non Af Amer: >60 mL/min (ref >60)
Glucose, Bld: 345 mg/dL — ABNORMAL HIGH (ref 70–99)
Potassium: 4.5 mmol/L (ref 3.5–5.1)
Sodium: 130 mmol/L — ABNORMAL LOW (ref 135–145)
Total Bilirubin: 0.5 mg/dL (ref 0.3–1.2)
Total Protein: 7.8 g/dL (ref 6.5–8.1)

## 2018-07-13 LAB — CBC WITH DIFFERENTIAL/PLATELET
Abs Immature Granulocytes: 0.67 10*3/uL — ABNORMAL HIGH (ref 0.00–0.07)
Basophils Absolute: 0 10*3/uL (ref 0.0–0.1)
Basophils Relative: 0 %
Eosinophils Absolute: 0 10*3/uL (ref 0.0–0.5)
Eosinophils Relative: 0 %
HCT: 34.4 % — ABNORMAL LOW (ref 36.0–46.0)
Hemoglobin: 10.9 g/dL — ABNORMAL LOW (ref 12.0–15.0)
Immature Granulocytes: 4 %
Lymphocytes Relative: 12 %
Lymphs Abs: 1.8 10*3/uL (ref 0.7–4.0)
MCH: 31.4 pg (ref 26.0–34.0)
MCHC: 31.7 g/dL (ref 30.0–36.0)
MCV: 99.1 fL (ref 80.0–100.0)
Monocytes Absolute: 1.5 10*3/uL — ABNORMAL HIGH (ref 0.1–1.0)
Monocytes Relative: 10 %
Neutro Abs: 11.2 10*3/uL — ABNORMAL HIGH (ref 1.7–7.7)
Neutrophils Relative %: 74 %
Platelets: 51 10*3/uL — ABNORMAL LOW (ref 150–400)
RBC: 3.47 MIL/uL — ABNORMAL LOW (ref 3.87–5.11)
RDW: 19.2 % — ABNORMAL HIGH (ref 11.5–15.5)
WBC: 15.2 10*3/uL — ABNORMAL HIGH (ref 4.0–10.5)
nRBC: 0 % (ref 0.0–0.2)

## 2018-07-13 LAB — KOH PREP

## 2018-07-13 NOTE — Progress Notes (Signed)
[facility] PROGRESS NOTE  Krystal Short KDT:[id number] DOB: 03-Nov-1985 DOA: 07/04/2018 PCP: [name]   Narrative:  33 year old African-American female, Tobacco abuse ~ quit11/2018 HIV 06/05/2017, HIV 1+ antibody positive HIV-2 antibody negative CD4 was 10-he was started on Biktarvy and was set up for follow-up At that time she was thought to have suspected PJP, Was sent home on prednisone and Bactrim  Had 2 subsequent admissions 03/08/2018-03/10/2018  On her last admission 4/21 through 424 she had a CT scan which that was more suggestive of enlarged mediastinal bilateral hilar lymph nodes sarcoid and had a follow-up with Dr. Melvyn Novas of pulmonology--?she had resolving opportunistic infection with the caveat that having just finished prednisone patient may have look better than she really was-he wanted to follow-up with her around the beginning of June but she came back to the hospital before then  she was also referred in the outpatient setting to hematology because of thrombocytopenia and anemia-  Readmitted 5/28 fevers 103 sinus tach sodium 128 hemoglobin 5.1 platelet 14 Received vancomycin cefepime azithromycin and boluses of IV fluid  -- hematology was consulted here because hemoglobin was 5.1 with severe acute thrombocytopenia platelets in the 14 range Oncology feels not TTP, ID input has been helpful in addition to pulmonology delineating planning She had a bone marrow biopsy 5/29 showing trilineage forms hemophagocytic cells which was nonspecific Bronchoscopy performed 6/5  Awaiting answers and conintued clinical stability prior to discharge    A & Plan  Atypical infection versus sarcoid Hoping bronchoscopy 6/5 results will give [place] a diagnosis Per ID counsel however prednisone at this time-off Bactrim--dapsone 6/3 She is not ready for discharge as she has been hospitalized multiple times without a clear answer and I feel she needs bronchoscopy reports  prior to decisions about home Following all bronchoscopy labs--see blow results for details Acute anemia thrombocytopenia Irregular periods on Depo-Provera since December 2019 for contraception, not menorrhagia Etiology still unclear-- Defer to oncology further management Till date 4 units PRBC, 3 units of platelets Hepatosplenomegaly noted on CT 6/4 Platelets are up but she received a transfusion 6/5 AKI on admission Metabolic acidosis non-anion gap  BUN/creatinine 26/1.1 and now resolved Continue bicarb 650 labs a.m. Slightly elevated LFTs Etiology unclear last check 5/29 LFTs were normal however pattern at this time shows equal preponderance AST ALT elevation with mild elevation of alk phos- will scan with abdominal ultrasound if persistent trend upwards, however appears to be leveling off, which coincides with PLT being stable---etiologies are unclear HGSIL Needs outpatient follow-up patient states has this in early July and should keep appointment Do not know if this has some bearing on her low blood counts?  Might need transvaginal ultrasound  HIV AIDS RNA quant <20 fungal culture is negative AFB is negative, blood cultures are negative Defer to ID-continue Biktarvy, dapsone as above Former tobacco abuse and ~2018   DVT SCD code Status: Full communication: Patient alone disposition Plan:    Verlon Au, MD  [facility] [place] [email or web address] 7PM-7AM contact night coverage as above 07/13/2018, 1:49 PM  LOS: 9 days   Consultants:  ID  Hematology  Pulmonology  Procedures:  Multiple  Antimicrobials:  Dapsone Biktarvy  Interval history/Subjective:  Throat still sore No cp No fever No cough no cold Eating ice cream for throat   Objective:  Vitals:  Vitals:   07/12/18 2220 07/13/18 0529  BP: 93/68 95/65  Pulse: (!) 108 93  Resp:  17  Temp: 98.1 F (36.7  C) 98 F (36.7 C)  SpO2: 95% 97%    Exam:  Coherent no distress EOMI NCAT Throat  stable Chest clear no added sound S1-S2 no murmur Abdomen soft slight distention No neurological findings  I have personally reviewed the following:  DATA  Bronchoscopy labs 6/5   Body fluid count hazy-350 wbc, cytology , afb,koh, fungal cult, surg path etc pending Labs:  Sodium 130-->131 potassium 5.2->4.7-->4.4-->4.5  LFTs 119 AST, ALT 151  CO2 17-->20-->20  Platelet count 19--25-->37-->27-->56-->51 once again currently  Hemoglobin 5.1-->10.8-->11-->10.9  WBC up from 10.7-12.0-->15.2 with mild left shift [afebrile]  Imaging studies:  Multiple  Medical tests:  None  Decision to obtain old records:  Yes  Review and summation of old records:  He has extensively summarized  Scheduled Meds: . B-complex with vitamin C  1 tablet Oral Daily  . bictegravir-emtricitabine-tenofovir AF  1 tablet Oral Daily  . Chlorhexidine Gluconate Cloth  6 each Topical Q0600  . dapsone  100 mg Oral Daily  . folic acid  2 mg Oral Daily  . mouth rinse  15 mL Mouth Rinse BID  . predniSONE  40 mg Oral Q breakfast  . sodium bicarbonate  650 mg Oral Daily   Continuous Infusions:  Principal Problem:   Acute respiratory failure with hypoxia (HCC) Active Problems:   AIDS (acquired immune deficiency syndrome) (HCC)   Symptomatic anemia   Thrombocytopenia (HCC)   ASCUS with positive high risk HPV cervical   Pulmonary infiltrates   Hilar adenopathy   Hemophagocytosis present in bone marrow ([place])   LOS: 9 days

## 2018-07-14 DIAGNOSIS — Z20828 Contact with and (suspected) exposure to other viral communicable diseases: Secondary | ICD-10-CM | POA: Diagnosis not present

## 2018-07-14 DIAGNOSIS — B59 Pneumocystosis: Secondary | ICD-10-CM | POA: Diagnosis not present

## 2018-07-14 DIAGNOSIS — J9601 Acute respiratory failure with hypoxia: Secondary | ICD-10-CM | POA: Diagnosis not present

## 2018-07-14 DIAGNOSIS — N390 Urinary tract infection, site not specified: Secondary | ICD-10-CM | POA: Diagnosis not present

## 2018-07-14 DIAGNOSIS — B370 Candidal stomatitis: Secondary | ICD-10-CM | POA: Diagnosis not present

## 2018-07-14 DIAGNOSIS — B3781 Candidal esophagitis: Secondary | ICD-10-CM | POA: Diagnosis not present

## 2018-07-14 DIAGNOSIS — B20 Human immunodeficiency virus [HIV] disease: Secondary | ICD-10-CM | POA: Diagnosis not present

## 2018-07-14 DIAGNOSIS — A419 Sepsis, unspecified organism: Secondary | ICD-10-CM | POA: Diagnosis not present

## 2018-07-14 DIAGNOSIS — N179 Acute kidney failure, unspecified: Secondary | ICD-10-CM | POA: Diagnosis not present

## 2018-07-14 LAB — ACID FAST SMEAR (AFB, MYCOBACTERIA): Acid Fast Smear: NEGATIVE

## 2018-07-14 NOTE — Progress Notes (Addendum)
Patient seen examined and no clinical changes Sore throat is better eating salad, swedish fish candy No cough no cold '   BP 107/81   Pulse 97   Temp (!) 97.4 F (36.3 C) (Oral)   Resp 18   Ht _0  (1.575 m)   Wt 61 kg   LMP  (LMP Unknown) Comment: depo injection this week (06/20/18)  SpO2 100%   BMI 24.60 kg/m   eomi ncat no pallor,ict Throat soft cta b s1 s2 no m/r/g abd soft nt slighlty distended No le edema   P PAth review pending BAL cult microscopy also re-incubated  No real changes today, so no charge--await Path prior to further dispo planning for home  Verneita Griffes, MD [facility] 2:31 PM

## 2018-07-15 ENCOUNTER — Other Ambulatory Visit: Payer: Self-pay

## 2018-07-15 DIAGNOSIS — Z8619 Personal history of other infectious and parasitic diseases: Secondary | ICD-10-CM

## 2018-07-15 DIAGNOSIS — B20 Human immunodeficiency virus [HIV] disease: Secondary | ICD-10-CM

## 2018-07-15 DIAGNOSIS — R634 Abnormal weight loss: Secondary | ICD-10-CM

## 2018-07-15 LAB — CBC WITH DIFFERENTIAL/PLATELET
Abs Immature Granulocytes: 0.37 10*3/uL — ABNORMAL HIGH (ref 0.00–0.07)
Basophils Absolute: 0 10*3/uL (ref 0.0–0.1)
Basophils Relative: 0 %
Eosinophils Absolute: 0 10*3/uL (ref 0.0–0.5)
Eosinophils Relative: 0 %
HCT: 32.6 % — ABNORMAL LOW (ref 36.0–46.0)
Hemoglobin: 10.1 g/dL — ABNORMAL LOW (ref 12.0–15.0)
Immature Granulocytes: 4 %
Lymphocytes Relative: 19 %
Lymphs Abs: 2 10*3/uL (ref 0.7–4.0)
MCH: 31.4 pg (ref 26.0–34.0)
MCHC: 31 g/dL (ref 30.0–36.0)
MCV: 101.2 fL — ABNORMAL HIGH (ref 80.0–100.0)
Monocytes Absolute: 1.1 10*3/uL — ABNORMAL HIGH (ref 0.1–1.0)
Monocytes Relative: 10 %
Neutro Abs: 7.2 10*3/uL (ref 1.7–7.7)
Neutrophils Relative %: 67 %
Platelets: 79 10*3/uL — ABNORMAL LOW (ref 150–400)
RBC: 3.22 MIL/uL — ABNORMAL LOW (ref 3.87–5.11)
RDW: 19 % — ABNORMAL HIGH (ref 11.5–15.5)
WBC: 10.6 10*3/uL — ABNORMAL HIGH (ref 4.0–10.5)
nRBC: 0 % (ref 0.0–0.2)

## 2018-07-15 LAB — COMPREHENSIVE METABOLIC PANEL
ALT: 63 U/L — ABNORMAL HIGH (ref 0–44)
AST: 23 U/L (ref 15–41)
Albumin: 2.7 g/dL — ABNORMAL LOW (ref 3.5–5.0)
Alkaline Phosphatase: 148 U/L — ABNORMAL HIGH (ref 38–126)
Anion gap: 10 (ref 5–15)
BUN: 10 mg/dL (ref 6–20)
CO2: 20 mmol/L — ABNORMAL LOW (ref 22–32)
Calcium: 8.4 mg/dL — ABNORMAL LOW (ref 8.9–10.3)
Chloride: 103 mmol/L (ref 98–111)
Creatinine, Ser: 0.6 mg/dL (ref 0.44–1.00)
GFR calc Af Amer: >60 mL/min (ref >60)
GFR calc non Af Amer: >60 mL/min (ref >60)
Glucose, Bld: 227 mg/dL — ABNORMAL HIGH (ref 70–99)
Potassium: 3.8 mmol/L (ref 3.5–5.1)
Sodium: 133 mmol/L — ABNORMAL LOW (ref 135–145)
Total Bilirubin: 0.5 mg/dL (ref 0.3–1.2)
Total Protein: 7.4 g/dL (ref 6.5–8.1)

## 2018-07-15 LAB — PROTIME-INR
INR: 1 (ref 0.8–1.2)
Prothrombin Time: 13.5 seconds (ref 11.4–15.2)

## 2018-07-15 LAB — CULTURE, RESPIRATORY W GRAM STAIN

## 2018-07-15 MED ORDER — AZITHROMYCIN 250 MG PO TABS
250.0000 mg | ORAL_TABLET | Freq: Every day | ORAL | Status: DC
  Administered 2018-07-16: 250 mg via ORAL
  Filled 2018-07-15: qty 1

## 2018-07-15 MED ORDER — DAPSONE 100 MG PO TABS: 100.0000 mg | ORAL_TABLET | Freq: Every day | ORAL | 0 refills | Status: DC

## 2018-07-15 MED ORDER — AZITHROMYCIN 250 MG PO TABS
500.0000 mg | ORAL_TABLET | Freq: Every day | ORAL | Status: AC
  Administered 2018-07-15: 500 mg via ORAL
  Filled 2018-07-15: qty 2

## 2018-07-15 NOTE — Progress Notes (Signed)
Patient ID: Krystal Short, female   DOB: 02-19-1985, 33 y.o.   MRN: [id number]         [facility]  Date of Admission:  07/04/2018         ASSESSMENT: Krystal Short underwent bronchoscopy and biopsy with a lymph node and lung biopsy 3 days ago.  Budding yeast were seen on stain but this is likely to be an insignificant colonizing species of Candida.  I do not feel Krystal Short needs to be on antifungal therapy.  Interestingly, BAL fluid cultures have grown Bordetella bronchiseptica, the cause of the canine kennel cough.  There are scattered case reports of HIV infected individuals developing pneumonia mimicking PJP presumably caused by Bordetella bronchiseptica.  The species Bordetella are susceptible to trimethoprim sulfamethoxazole.  Krystal Short received a 21-day course of therapy from 4/21-5/01/2019 and recently completed a second 6-day course of treatment.  I cannot find any case reports of relapsing pneumonia or reinfection but this certainly could be possible.  Krystal Short is currently asymptomatic but it her chest x-ray does still reveal some diffuse reticulonodular changes similar to her chest x-ray upon admission.  I will start her on azithromycin and await results of her biopsies.  PLAN: 1. Continue Biktarvy, dapsone and prednisone 2. Start azithromycin 3. Await results of lymph node and lung biopsies   Principal Problem:   Acute respiratory failure with hypoxia (HCC) Active Problems:   Pulmonary infiltrates   Hilar adenopathy   Symptomatic anemia   Thrombocytopenia (HCC)   AIDS (acquired immune deficiency syndrome) (HCC)   ASCUS with positive high risk HPV cervical   Hemophagocytosis present in bone marrow (HCC)   Scheduled Meds: . B-complex with vitamin C  1 tablet Oral Daily  . bictegravir-emtricitabine-tenofovir AF  1 tablet Oral Daily  . Chlorhexidine Gluconate Cloth  6 each Topical Q0600  . dapsone  100 mg Oral Daily  . folic acid  2 mg Oral Daily  . mouth rinse  15 mL  Mouth Rinse BID  . predniSONE  40 mg Oral Q breakfast  . sodium bicarbonate  650 mg Oral Daily   Continuous Infusions: PRN Meds:.alum & mag hydroxide-simeth, HYDROcodone-acetaminophen, menthol-cetylpyridinium, ondansetron **OR** [DISCONTINUED] ondansetron (ZOFRAN) IV   SUBJECTIVE: Krystal Short is feeling pretty much back to normal today.  Krystal Short is bored.  Krystal Short has a 6-year-old dog who is in good health.  Review of Systems: Review of Systems  Constitutional: Positive for weight loss. Negative for chills, diaphoresis, fever and malaise/fatigue.  HENT: Negative for congestion and sore throat.   Respiratory: Negative for cough, sputum production, shortness of breath and wheezing.   Cardiovascular: Negative for chest pain.  Gastrointestinal: Negative for abdominal pain, diarrhea, nausea and vomiting.  Skin: Negative for rash.    Allergies  Allergen Reactions  . Heparin     OBJECTIVE: Vitals:   07/14/18 1451 07/14/18 1958 07/15/18 0525 07/15/18 1503  BP: 102/65 111/73 108/76 104/75  Pulse: (!) 110 93 97 97  Resp: _0 Temp: 97.8 F (36.6 C) 97.8 F (36.6 C) 98 F (36.7 C) (!) 97.5 F (36.4 C)  TempSrc: Oral Oral Oral Oral  SpO2: 97% 100% 98% 100%  Weight:      Height:       Body mass index is 24.6 kg/m.  Physical Exam Constitutional:      Comments: Krystal Short is smiling and in good spirits.  Cardiovascular:     Rate and Rhythm: Normal rate and regular rhythm.     Heart  sounds: No murmur.  Pulmonary:     Effort: Pulmonary effort is normal.     Breath sounds: Normal breath sounds. No wheezing, rhonchi or rales.  Lymphadenopathy:     Head:     Right side of head: No submandibular adenopathy.     Left side of head: No submandibular adenopathy.     Cervical: No cervical adenopathy.     Upper Body:     Right upper body: No axillary adenopathy.     Left upper body: No axillary adenopathy.  Psychiatric:        Mood and Affect: Mood normal.     Lab Results Lab Results   Component Value Date   WBC 10.6 (H) 07/15/2018   HGB 10.1 (L) 07/15/2018   HCT 32.6 (L) 07/15/2018   MCV 101.2 (H) 07/15/2018   PLT 79 (L) 07/15/2018    Lab Results  Component Value Date   CREATININE 0.60 07/15/2018   BUN 10 07/15/2018   NA 133 (L) 07/15/2018   K 3.8 07/15/2018   CL 103 07/15/2018   CO2 20 (L) 07/15/2018    Lab Results  Component Value Date   ALT 63 (H) 07/15/2018   AST 23 07/15/2018   ALKPHOS 148 (H) 07/15/2018   BILITOT 0.5 07/15/2018    HIV 1 RNA Quant (copies/mL)  Date Value  07/06/2018 <20  06/20/2018 <20 DETECTED (A)  04/02/2018 <20 DETECTED (A)   CD4 T Cell Abs (/uL)  Date Value  06/20/2018 <35 (L)  04/02/2018 10 (L)  12/17/2017 50 (L)   Microbiology: Recent Results (from the past 240 hour(s))  Culture, respiratory (non-expectorated)     Status: None   Collection Time: 07/12/18 11:48 AM  Result Value Ref Range Status   Specimen Description   Final    BRONCHIAL ALVEOLAR LAVAGE RIGHT UPPER LOBE Performed at [facility], [place] [address], [place], [place] [address]    Special Requests   Final    Immunocompromised Performed at [facility], [place] [address], [place], [place] [address]    Gram Stain   Final    FEW WBC PRESENT, PREDOMINANTLY MONONUCLEAR NO ORGANISMS SEEN    Culture   Final    FEW BORDETELLA BRONCHISEPTICA Standardized susceptibility testing for this organism is not available. Performed at [facility], [place] [address] [place], [place] [address]    Report Status 07/15/2018 FINAL  Final  Acid Fast Smear (AFB)     Status: None   Collection Time: 07/12/18 11:48 AM  Result Value Ref Range Status   AFB Specimen Processing Concentration  Final   Acid Fast Smear Negative  Final    Comment: (NOTE) Performed At: [facility] [place], [place] [id number] Rush Farmer MD [id number]    Source (AFB) BRONCHIAL ALVEOLAR LAVAGE  Final    Comment: RIGHT UPPER LOBE  Performed at [facility], [place] [address] [place], [place] [address]   Culture, fungus without smear     Status: None (Preliminary result)   Collection Time: 07/12/18 11:48 AM  Result Value Ref Range Status   Specimen Description   Final    BRONCHIAL ALVEOLAR LAVAGE RIGHT UPPER LOBE Performed at [place] [address], [place], [place] [address]    Special Requests   Final    Immunocompromised Performed at [facility], [place] [address], [place], [place] [address]    Culture   Final    NO FUNGUS  ISOLATED AFTER 3 DAYS Performed at [facility], [place] [address], [place], [place] [address]    Report Status PENDING  Incomplete  KOH prep     Status: None   Collection Time: 07/12/18 11:48 AM  Result Value Ref Range Status   Specimen Description   Final    BRONCHIAL ALVEOLAR LAVAGE Performed at [place] [address], [place], [place] [address]    Special Requests   Final    Immunocompromised Performed at [facility], [place] [address] [place], [place] [address]    [place]   Final    BUDDING YEAST SEEN Performed at [facility], [place] [place],  [place] [address]    Report Status 07/13/2018 FINAL  Final    Michel Bickers, MD [facility] [place] [facility] [phone] pager   [phone] cell 07/15/2018, 3:29 PM

## 2018-07-15 NOTE — Progress Notes (Signed)
[facility] PROGRESS NOTE  Krystal Short [id number] DOB: 02-Sep-1985 DOA: 07/04/2018 PCP: [name]   Narrative:  33 year old African-American female, Tobacco abuse ~ quit11/2018 HIV 06/05/2017, HIV 1+ antibody positive HIV-2 antibody negative CD4 was 10-he was started on Biktarvy and was set up for follow-up At that time she was thought to have suspected PJP, Was sent home on prednisone and Bactrim  Had 2 subsequent admissions 03/08/2018-03/10/2018  On her last admission 4/21 through 424 she had a CT scan which that was more suggestive of enlarged mediastinal bilateral hilar lymph nodes sarcoid and had a follow-up with Dr. Melvyn Novas of pulmonology--?she had resolving opportunistic infection with the caveat that having just finished prednisone patient may have look better than she really was-he wanted to follow-up with her around the beginning of June but she came back to the hospital before then  she was also referred in the outpatient setting to hematology because of thrombocytopenia and anemia-  Readmitted 5/28 fevers 103 sinus tach sodium 128 hemoglobin 5.1 platelet 14 Received vancomycin cefepime azithromycin and boluses of IV fluid  -- hematology was consulted here because hemoglobin was 5.1 with severe acute thrombocytopenia platelets in the 14 range Oncology feels not TTP, ID input has been helpful in addition to pulmonology delineating planning She had a bone marrow biopsy 5/29 showing trilineage forms hemophagocytic cells which was nonspecific Bronchoscopy performed 6/5   results show Bordetella bronchiseptica-placed on azithromycin by ID Awaiting cultures at this time, I called  cytology ([phone] and I am still awaiting results    A & Plan  Atypical infection versus sarcoid -off Bactrim--dapsone 6/3 Azithromycin started 6/8 Await cytology from bronchoscopy Platelets have durably improved in addition to fall in leukocytosis, marked improvement  in LFTs leading me to believe we are either suppressing some unknown inflammation/autoimmune process-this could be an atypical infection it appears--greatly appreciate consultant expertise Acute anemia thrombocytopenia Irregular periods on Depo-Provera since December 2019 for contraception, not menorrhagia Etiology still unclear-- Defer to oncology further management Till date 4 units PRBC, 3 units of platelets Hepatosplenomegaly noted on CT 6/4 Much improved overall AKI on admission Metabolic acidosis non-anion gap-improved to some degree  BUN/creatinine 26/1.1 and now resolved 650-continuing bicarb at this time Slightly elevated LFTs Etiology unclear-possibly infectious-trend of LFTs: Since 6/5 to near normal levels 6/8 HGSIL Needs outpatient follow-up patient states has this in early July and should keep appointment HIV AIDS RNA quant <20 fungal culture is negative AFB is negative, blood cultures are negative Defer to ID-continue Biktarvy, dapsone as above Former tobacco abuse and ~2018   DVT SCD code Status: Full communication: Patient alone disposition Plan:    Verlon Au, MD  [facility] [employer] app [place] [email or web address] 7PM-7AM contact night coverage as above 07/15/2018, 4:38 PM  LOS: 11 days   Consultants:  ID  Hematology  Pulmonology  Procedures:  Multiple  Antimicrobials:  Dapsone Biktarvy  Interval history/Subjective:  Pleasant alert no distress Appetite is improved No fever no thrush no diarrhea   Objective:  Vitals:  Vitals:   07/15/18 0525 07/15/18 1503  BP: 108/76 104/75  Pulse: 97 97  Resp: 16 16  Temp: 98 F (36.7 C) (!) 97.5 F (36.4 C)  SpO2: 98% 100%    Exam:   Throat stable Chest clear no added sound S1-S2 no murmur Abdomen soft slight distention No neurological findings  I have personally reviewed the following:  DATA  Bronchoscopy labs 6/5   Body fluid count hazy-350 wbc, cytology , afb,koh,  fungal cult shows  rare Candida  Culture shows Bordetella bronchiseptica Labs:  Sodium 130-->131 potassium 5.2->4.7-->4.4-->4.5  LFTs 119 AST, ALT 151--->AST 23/ALT 63 currently  CO2 17-->20-->20  Platelet count 19--25-->37-->27-->56-->51-->87  Hemoglobin 5.1-->10.8-->11-->10.9  WBC up from 10.7-12.0-->15.2-->10.6   Scheduled Meds: . azithromycin  500 mg Oral Daily   Followed by  . [START ON 07/16/2018] azithromycin  250 mg Oral Daily  . B-complex with vitamin C  1 tablet Oral Daily  . bictegravir-emtricitabine-tenofovir AF  1 tablet Oral Daily  . Chlorhexidine Gluconate Cloth  6 each Topical Q0600  . dapsone  100 mg Oral Daily  . folic acid  2 mg Oral Daily  . mouth rinse  15 mL Mouth Rinse BID  . predniSONE  40 mg Oral Q breakfast  . sodium bicarbonate  650 mg Oral Daily   Continuous Infusions:  Principal Problem:   Acute respiratory failure with hypoxia (HCC) Active Problems:   AIDS (acquired immune deficiency syndrome) (HCC)   Symptomatic anemia   Thrombocytopenia (HCC)   ASCUS with positive high risk HPV cervical   Pulmonary infiltrates   Hilar adenopathy   Hemophagocytosis present in bone marrow (HCC)   LOS: 11 days

## 2018-07-15 NOTE — Progress Notes (Addendum)
HEMATOLOGY-ONCOLOGY PROGRESS NOTE  SUBJECTIVE: Pt has no new issues this morning. No bleeding. No new complaints.  REVIEW OF SYSTEMS:   Constitutional: Denies fevers and chills. Eyes: Denies blurriness of vision Ears, nose, mouth, throat, and face: Denies mucositis or sore throat Respiratory: Denies cough and shortness of breath.  Cardiovascular: Denies palpitations, chest discomfort Gastrointestinal:  Denies nausea, heartburn or change in bowel habits Skin: Denies abnormal skin rashes Lymphatics: Denies new lymphadenopathy or easy bruising Neurological:Denies numbness, tingling or new weaknesses Behavioral/Psych: Mood is stable, no new changes  Extremities: No lower extremity edema All other systems were reviewed with the patient and are negative.  I have reviewed the past medical history, past surgical history, social history and family history with the patient and they are unchanged from previous note.   PHYSICAL EXAMINATION:  Vitals:   07/14/18 1958 07/15/18 0525  BP: 111/73 108/76  Pulse: 93 97  Resp: 16 16  Temp: 97.8 F (36.6 C) 98 F (36.7 C)  SpO2: 100% 98%   Filed Weights   07/04/18 1313 07/06/18 1100 07/12/18 0959  Weight: 125 lb (56.7 kg) 134 lb 7.7 oz (61 kg) 134 lb 7.7 oz (61 kg)   . GENERAL:alert, in no acute distress and comfortable SKIN: no acute rashes, no significant lesions EYES: perl OROPHARYNX: MMM, no exudates, no oropharyngeal erythema or ulceration NECK: supple, no JVD LYMPH:  no palpable lymphadenopathy in the cervical, axillary or inguinal regions LUNGS: few basal rales on rhonci HEART: regular rate & rhythm ABDOMEN:  normoactive bowel sounds , non tender, not distended. Extremity: no pedal edema PSYCH: alert & oriented x 3 with fluent speech NEURO: no focal motor/sensory deficits   LABORATORY DATA:  I have reviewed the data as listed CMP Latest Ref Rng & Units 07/15/2018 07/13/2018 07/12/2018  Glucose 70 - 99 mg/dL 227(H) 345(H) 149(H)    BUN 6 - 20 mg/dL 10 8 9   Creatinine 0.44 - 1.00 mg/dL 0.60 0.64 0.68  Sodium 135 - 145 mmol/L 133(L) 130(L) 131(L)  Potassium 3.5 - 5.1 mmol/L 3.8 4.5 4.4  Chloride 98 - 111 mmol/L 103 102 105  CO2 22 - 32 mmol/L 20(L) 20(L) 20(L)  Calcium 8.9 - 10.3 mg/dL 8.4(L) 8.1(L) 8.3(L)  Total Protein 6.5 - 8.1 g/dL 7.4 7.8 7.4  Total Bilirubin 0.3 - 1.2 mg/dL 0.5 0.5 0.4  Alkaline Phos 38 - 126 U/L 148(H) 192(H) 197(H)  AST 15 - 41 U/L 23 57(H) 119(H)  ALT 0 - 44 U/L 63(H) 113(H) 151(H)  . CBC    Component Value Date/Time   WBC 10.6 (H) 07/15/2018 0817   RBC 3.22 (L) 07/15/2018 0817   HGB 10.1 (L) 07/15/2018 0817   HCT 32.6 (L) 07/15/2018 0817   PLT 79 (L) 07/15/2018 0817   MCV 101.2 (H) 07/15/2018 0817   MCH 31.4 07/15/2018 0817   MCHC 31.0 07/15/2018 0817   RDW 19.0 (H) 07/15/2018 0817   LYMPHSABS 2.0 07/15/2018 0817   MONOABS 1.1 (H) 07/15/2018 0817   EOSABS 0.0 07/15/2018 0817   BASOSABS 0.0 07/15/2018 0817     Dg Chest 2 View  Result Date: 06/18/2018 CLINICAL DATA:  Dyspnea on exertion EXAM: CHEST - 2 VIEW COMPARISON:  05/28/2018 FINDINGS: Cardiac shadow is within normal limits. The reticulonodular densities seen on prior examination have nearly completely resolved. No sizable infiltrate or effusion is seen. No bony abnormality is noted. IMPRESSION: Near complete resolution of previously seen reticulonodular densities. Electronically Signed   By: Linus Mako.D.  On: 06/18/2018 15:47   Ct Chest W Contrast  Result Date: 07/04/2018 CLINICAL DATA:  Increased shortness of breath EXAM: CT CHEST WITH CONTRAST TECHNIQUE: Multidetector CT imaging of the chest was performed during intravenous contrast administration. CONTRAST:  62m OMNIPAQUE IOHEXOL 300 MG/ML  SOLN COMPARISON:  05/28/2018 FINDINGS: Cardiovascular: No significant vascular findings. Normal heart size. No pericardial effusion. Mediastinum/Nodes: Stable mediastinal and bilateral hilar lymphadenopathy measuring up to 17 mm  in short axis along the right paratracheal location. Normal trachea. Normal esophagus. Normal thyroid gland. Lungs/Pleura: Diffuse bilateral interstitial thickening and fissural thickening. Peripheral sub pleural thickening again noted bilaterally. Abnormality involves bilateral upper and lower lungs with the lung bases least affected. Trace left pleural effusion. No right pleural effusion. Upper Abdomen: No acute abnormality. Musculoskeletal: No chest wall abnormality. No acute or significant osseous findings. IMPRESSION: 1. Worsening diffuse bilateral interstitial and fissural thickening with sub pleural thickening and scattered areas of ground-glass opacity. Differential considerations include chronic interstitial lung disease such as sarcoidosis versus atypical infection given the patient's history of HIV. Electronically Signed   By: HKathreen Devoid  On: 07/04/2018 18:34   Ct Abdomen Pelvis W Contrast  Result Date: 07/11/2018 CLINICAL DATA:  Hepatomegaly.  Patient has no current complaints. EXAM: CT ABDOMEN AND PELVIS WITH CONTRAST TECHNIQUE: Multidetector CT imaging of the abdomen and pelvis was performed using the standard protocol following bolus administration of intravenous contrast. CONTRAST:  103mOMNIPAQUE IOHEXOL 300 MG/ML SOLN, 3066mMNIPAQUE IOHEXOL 300 MG/ML SOLN COMPARISON:  CT chest dated Jul 04, 2018. FINDINGS: Lower chest: Improving septal thickening and ground-glass density at the lung bases. Resolved pleural effusions. Hepatobiliary: Mild hepatomegaly. No focal liver abnormality. The gallbladder is unremarkable. No biliary dilatation. Pancreas: Unremarkable. No pancreatic ductal dilatation or surrounding inflammatory changes. Spleen: Mild splenomegaly.  No focal abnormality. Adrenals/Urinary Tract: Adrenal glands are unremarkable. Kidneys are normal, without renal calculi, focal lesion, or hydronephrosis. Bladder is unremarkable. Stomach/Bowel: Small hiatal hernia. The stomach is otherwise  within normal limits. No bowel wall thickening, distention, or surrounding inflammatory changes. Normal appendix. Vascular/Lymphatic: No significant vascular findings are present. No enlarged abdominal or pelvic lymph nodes. Reproductive: Uterus and bilateral adnexa are unremarkable. Tampon in the vagina. Other: Trace free fluid in the pelvis is likely physiologic. No pneumoperitoneum. Musculoskeletal: No acute or significant osseous findings. Degenerative disc disease at L5-S1. IMPRESSION: 1. Mild hepatosplenomegaly.  No focal abnormality. 2. Improving septal thickening and ground-glass density at the lung bases, favor resolving atypical infection given history of HIV. Electronically Signed   By: WilTitus DubinD.   On: 07/11/2018 18:16   Ct Biopsy  Result Date: 07/05/2018 INDICATION: History of HIV/AIDS, now with anemia and thrombocytopenia. Please perform CT-guided bone marrow biopsy for tissue diagnostic purposes. Please obtain a sample for additional culture analysis. EXAM: CT-GUIDED BONE MARROW BIOPSY AND ASPIRATION MEDICATIONS: None ANESTHESIA/SEDATION: Versed 2 mg IV Sedation Time: 10 Minutes; The patient was continuously monitored during the procedure by the interventional radiology nurse under my direct supervision. COMPLICATIONS: None immediate. PROCEDURE: Informed consent was obtained from the patient following an explanation of the procedure, risks, benefits and alternatives. The patient understands, agrees and consents for the procedure. All questions were addressed. A time out was performed prior to the initiation of the procedure. The patient was positioned prone and non-contrast localization CT was performed of the pelvis to demonstrate the iliac marrow spaces. The operative site was prepped and draped in the usual sterile fashion. Under sterile conditions and local anesthesia, a 22 gauge  spinal needle was utilized for procedural planning. Next, an 11 gauge coaxial bone biopsy needle was  advanced into the left iliac marrow space. Needle position was confirmed with CT imaging. Initially, bone marrow aspiration was performed. Next, a bone marrow biopsy was obtained with the 11 gauge outer bone marrow device. Samples were prepared with the cytotechnologist and deemed adequate. The needle was removed intact. Hemostasis was obtained with compression and a dressing was placed. The patient tolerated the procedure well without immediate post procedural complication. IMPRESSION: Successful CT guided left iliac bone marrow aspiration and core biopsy. Note, a portion of the core biopsy with set aside for culture analysis. Electronically Signed   By: Sandi Mariscal M.D.   On: 07/05/2018 11:43   Dg Chest Port 1 View  Result Date: 07/12/2018 CLINICAL DATA:  Status post endobronchial ultrasound in aspiration. EXAM: PORTABLE CHEST 1 VIEW COMPARISON:  Radiograph of Jul 04, 2018. FINDINGS: The heart size and mediastinal contours are within normal limits. Both lungs are clear. No pneumothorax or pleural effusion is noted. The visualized skeletal structures are unremarkable. IMPRESSION: No active disease. Electronically Signed   By: Marijo Conception M.D.   On: 07/12/2018 15:22   Dg Chest Portable 1 View  Result Date: 07/04/2018 CLINICAL DATA:  Worsening shortness of breath and cough EXAM: PORTABLE CHEST 1 VIEW COMPARISON:  06/18/2018 FINDINGS: There is bilateral diffuse reticular interstitial thickening. There is no focal consolidation. There is no pleural effusion or pneumothorax. The heart and mediastinal contours are unremarkable. The osseous structures are unremarkable. IMPRESSION: Bilateral diffuse reticular interstitial thickening which may reflect mild interstitial edema versus pneumonia including atypical pneumonia. Electronically Signed   By: Kathreen Devoid   On: 07/04/2018 12:57   Ct Bone Marrow Biopsy & Aspiration  Result Date: 07/05/2018 INDICATION: History of HIV/AIDS, now with anemia and  thrombocytopenia. Please perform CT-guided bone marrow biopsy for tissue diagnostic purposes. Please obtain a sample for additional culture analysis. EXAM: CT-GUIDED BONE MARROW BIOPSY AND ASPIRATION MEDICATIONS: None ANESTHESIA/SEDATION: Versed 2 mg IV Sedation Time: 10 Minutes; The patient was continuously monitored during the procedure by the interventional radiology nurse under my direct supervision. COMPLICATIONS: None immediate. PROCEDURE: Informed consent was obtained from the patient following an explanation of the procedure, risks, benefits and alternatives. The patient understands, agrees and consents for the procedure. All questions were addressed. A time out was performed prior to the initiation of the procedure. The patient was positioned prone and non-contrast localization CT was performed of the pelvis to demonstrate the iliac marrow spaces. The operative site was prepped and draped in the usual sterile fashion. Under sterile conditions and local anesthesia, a 22 gauge spinal needle was utilized for procedural planning. Next, an 11 gauge coaxial bone biopsy needle was advanced into the left iliac marrow space. Needle position was confirmed with CT imaging. Initially, bone marrow aspiration was performed. Next, a bone marrow biopsy was obtained with the 11 gauge outer bone marrow device. Samples were prepared with the cytotechnologist and deemed adequate. The needle was removed intact. Hemostasis was obtained with compression and a dressing was placed. The patient tolerated the procedure well without immediate post procedural complication. IMPRESSION: Successful CT guided left iliac bone marrow aspiration and core biopsy. Note, a portion of the core biopsy with set aside for culture analysis. Electronically Signed   By: Sandi Mariscal M.D.   On: 07/05/2018 11:43      ASSESSMENT AND PLAN: 1. Anemia - stable post transfusion. No overt intravascular hemolysis  currently. No TTP. Could be from batrim,  HIV r/o other etiologies in light of mediastinal LNadenoparthy 2. Thrombocytopenia 3. PCP , COVid19 neg 4. Sepsis secondary to #3; resolved 5. ? Sarcoidosis 6. AKI; resolved 7. HIV/AIDS  PLAN -Anemia thrombocytopenia are likely multifactorial.  She has no evidence of TTP/MAHA.  -Bone marrow biopsy results showed no overt evidence of granulomas lymphoma AFB or other overt viral inclusions.  No overt evidence of MDS-like changes related to HIV. No megaloblastoid changes. Some minor hemophagocytic forms noted in the bone marrow which is likely reactive given her recurrent infections. No acute interventions for the minor hemophagocytic changes at this time other than treating underlying infectious process or acute inflammatory process.  -Await results of bronchoscopy -Bactrim D/C on 07/10/2018.  Platelet count slowly recovering.  Hemoglobin remained stable. -Monitor for overt hemolysis since she has been restarted on dapsone and there was some concern for presence of bite cells on her initial smear though her measured baseline G6PD levels were reasonable. -Continue folic acid and B complex vitamin to support hematopoiesis. -Transfuse packed red blood cells for hemoglobin less than 7.0 or active bleeding.  No transfusion indicated today -Transfuse platelets for platelet count less than 20,000 if active bleeding or in the setting of sepsis. No transfusion indicated today. -Please do 1 hour post count after each use of platelets.  We will sign off at this time.  Reconsult hematology if there are additional questions or concerns.  Discussed outpatient f/u with Hematology. The patient states that it would be much easier for her to be seen in [place] instead of [place] since she lives here. We can arrange for outpatient follow-up here in [facility] with Dr. Irene Limbo.    Mikey Bussing, DNP, AGPCNP-BC, AOCNP  ADDENDUM  .Patient was Personally and independently interviewed, examined and relevant  elements of the history of present illness were reviewed in details and an assessment and plan was created. All elements of the patient's history of present illness , assessment and plan were discussed in details with Mikey Bussing, DNP. The above documentation reflects our combined findings assessment and plan.  Sullivan Lone MD MS

## 2018-07-16 LAB — COMPREHENSIVE METABOLIC PANEL
ALT: 50 U/L — ABNORMAL HIGH (ref 0–44)
AST: 31 U/L (ref 15–41)
Albumin: 2.9 g/dL — ABNORMAL LOW (ref 3.5–5.0)
Alkaline Phosphatase: 136 U/L — ABNORMAL HIGH (ref 38–126)
Anion gap: 8 (ref 5–15)
BUN: 13 mg/dL (ref 6–20)
CO2: 24 mmol/L (ref 22–32)
Calcium: 8.4 mg/dL — ABNORMAL LOW (ref 8.9–10.3)
Chloride: 102 mmol/L (ref 98–111)
Creatinine, Ser: 0.68 mg/dL (ref 0.44–1.00)
GFR calc Af Amer: >60 mL/min (ref >60)
GFR calc non Af Amer: >60 mL/min (ref >60)
Glucose, Bld: 257 mg/dL — ABNORMAL HIGH (ref 70–99)
Potassium: 3.9 mmol/L (ref 3.5–5.1)
Sodium: 134 mmol/L — ABNORMAL LOW (ref 135–145)
Total Bilirubin: 0.7 mg/dL (ref 0.3–1.2)
Total Protein: 7.6 g/dL (ref 6.5–8.1)

## 2018-07-16 LAB — CBC WITH DIFFERENTIAL/PLATELET
Abs Immature Granulocytes: 0.19 10*3/uL — ABNORMAL HIGH (ref 0.00–0.07)
Basophils Absolute: 0 10*3/uL (ref 0.0–0.1)
Basophils Relative: 0 %
Eosinophils Absolute: 0 10*3/uL (ref 0.0–0.5)
Eosinophils Relative: 0 %
HCT: 33.1 % — ABNORMAL LOW (ref 36.0–46.0)
Hemoglobin: 10.2 g/dL — ABNORMAL LOW (ref 12.0–15.0)
Immature Granulocytes: 3 %
Lymphocytes Relative: 28 %
Lymphs Abs: 1.8 10*3/uL (ref 0.7–4.0)
MCH: 32 pg (ref 26.0–34.0)
MCHC: 30.8 g/dL (ref 30.0–36.0)
MCV: 103.8 fL — ABNORMAL HIGH (ref 80.0–100.0)
Monocytes Absolute: 0.8 10*3/uL (ref 0.1–1.0)
Monocytes Relative: 12 %
Neutro Abs: 3.8 10*3/uL (ref 1.7–7.7)
Neutrophils Relative %: 57 %
Platelets: 108 10*3/uL — ABNORMAL LOW (ref 150–400)
RBC: 3.19 MIL/uL — ABNORMAL LOW (ref 3.87–5.11)
RDW: 19.2 % — ABNORMAL HIGH (ref 11.5–15.5)
WBC: 6.6 10*3/uL (ref 4.0–10.5)
nRBC: 0 % (ref 0.0–0.2)

## 2018-07-16 LAB — PROTIME-INR
INR: 1 (ref 0.8–1.2)
Prothrombin Time: 13.2 seconds (ref 11.4–15.2)

## 2018-07-16 MED ORDER — PREDNISONE 20 MG PO TABS: 40.0000 mg | ORAL_TABLET | Freq: Every day | ORAL | 0 refills | Status: DC

## 2018-07-16 MED ORDER — DOXYCYCLINE HYCLATE 100 MG PO TABS: 100.0000 mg | ORAL_TABLET | Freq: Two times a day (BID) | ORAL | 0 refills | Status: AC

## 2018-07-16 MED ORDER — AZITHROMYCIN 250 MG PO TABS: ORAL_TABLET | ORAL | 0 refills | Status: DC

## 2018-07-16 MED FILL — predniSONE 20 MG TABS: 20 | 30 days supply | Qty: 30 | Fill #0

## 2018-07-16 MED FILL — DOXYCYCLINE HYCLATE 100 MG: 100 | 5 days supply | Qty: 10 | Fill #0

## 2018-07-16 NOTE — Progress Notes (Signed)
   NAMEDaisie Short, MRN:  [id number], DOB:  Aug 19, 1985, LOS: 12 ADMISSION DATE:  07/04/2018, CONSULTATION DATE:  07/11/2018 REFERRING MD:  Verlon Au, CHIEF COMPLAINT: Pneumonia  Brief History   33 yo female with hx of HIV on therapy presented with fever and persistent b/l pulmonary infiltrates.  Prior hx of PCP.  Concern for sarcoidosis versus opportunistic infection.  Significant Hospital Events   Bronchoscopy, EBUS 07/12/2018  Interim history/subjective:  Denies chest pain, cough, sputum, hemoptysis.  No fever.    Objective   Blood pressure 105/80, pulse 90, temperature 98 F (36.7 C), temperature source Oral, resp. rate 18, height _0  (1.575 m), weight 61 kg, SpO2 100 %.        Intake/Output Summary (Last 24 hours) at 07/16/2018 1033 Last data filed at 07/15/2018 1503 Gross per 24 hour  Intake 240 ml  Output -  Net 240 ml   Filed Weights   07/04/18 1313 07/06/18 1100 07/12/18 0959  Weight: 56.7 kg 61 kg 61 kg    Examination:  General - alert Eyes - pupils reactive ENT - no sinus tenderness, no stridor Cardiac - regular rate/rhythm, no murmur Chest - equal breath sounds b/l, no wheezing or rales Abdomen - soft, non tender, + bowel sounds Extremities - no cyanosis, clubbing, or edema Skin - small lumps on skin Neuro - normal strength, moves extremities, follows commands  Discussion:  Bronchoscopy results did not show malignancy or granulomas.  Culture was positive for Bordetella.  KOH prep from BAL showed budding yeast, but not sure this is significant.  She has clinical improvement with steroids, so presumptive dx of sarcoid is still likely.  Assessment & Plan:   Steroid responsive pulmonary infiltrates with presumptive diagnosis of sarcoidosis. Plan - continue prednisone 40 mg daily until outpt pulmonary follow up  Bordetella bronchiseptica. Plan - continue zithromax per ID  Budding yeast in BAL KOH prep > likely candida. Plan - f/u cultures  Hx of  Molluscum contagiosum, Epidermodysplasia verruciformis. Plan - f/u with dermatology as outpt  Hx of HIV. Plan - ID following  She is scheduled for pulmonary office follow up with Geraldo Pitter at 2:30 pm on 07/30/18.  Please call if additional help needed while she is in hospital.   Labs    CMP Latest Ref Rng & Units 07/15/2018 07/13/2018 07/12/2018  Glucose 70 - 99 mg/dL 227(H) 345(H) 149(H)  BUN 6 - 20 mg/dL _1 Creatinine 0.44 - 1.00 mg/dL 0.60 0.64 0.68  Sodium 135 - 145 mmol/L 133(L) 130(L) 131(L)  Potassium 3.5 - 5.1 mmol/L 3.8 4.5 4.4  Chloride 98 - 111 mmol/L 103 102 105  CO2 22 - 32 mmol/L 20(L) 20(L) 20(L)  Calcium 8.9 - 10.3 mg/dL 8.4(L) 8.1(L) 8.3(L)  Total Protein 6.5 - 8.1 g/dL 7.4 7.8 7.4  Total Bilirubin 0.3 - 1.2 mg/dL 0.5 0.5 0.4  Alkaline Phos 38 - 126 U/L 148(H) 192(H) 197(H)  AST 15 - 41 U/L 23 57(H) 119(H)  ALT 0 - 44 U/L 63(H) 113(H) 151(H)   CBC Latest Ref Rng & Units 07/15/2018 07/13/2018 07/12/2018  WBC 4.0 - 10.5 K/uL 10.6(H) 15.2(H) 12.0(H)  Hemoglobin 12.0 - 15.0 g/dL 10.1(L) 10.9(L) 11.0(L)  Hematocrit 36.0 - 46.0 % 32.6(L) 34.4(L) 35.2(L)  Platelets 150 - 400 K/uL 79(L) 51(L) 56(L)   Chesley Mires, MD [place] 07/16/2018, 10:40 AM

## 2018-07-16 NOTE — Discharge Summary (Signed)
Physician Discharge Summary  Krystal Short MRN:[id number] DOB: 01/23/1986 DOA: 07/04/2018  PCP: [facility]  Admit date: 07/04/2018 Discharge date: 07/16/2018  Time spent: 25 minutes  Recommendations for Outpatient Follow-up:  1. Complete 4 more days of azithromycin ending 07/19/2018 2. Requires continued steroids until follow-up visit with pulmonology 07/30/2018 3. Follow-up with ID to be scheduled follow-up with dermatology to be scheduled  Discharge Diagnoses:  Principal Problem:   Acute respiratory failure with hypoxia (HCC) Active Problems:   AIDS (acquired immune deficiency syndrome) (HCC)   Symptomatic anemia   Thrombocytopenia (HCC)   ASCUS with positive high risk HPV cervical   Pulmonary infiltrates   Hilar adenopathy   Hemophagocytosis present in bone marrow (HCC)   Discharge Condition: Much improved  Diet recommendation: Regular  Filed Weights   07/04/18 1313 07/06/18 1100 07/12/18 0959  Weight: 56.7 kg 61 kg 61 kg    History of present illness:  33-year-old African-American female, Tobacco abuse ~ quit 12/2016 HIV 06/05/2017, HIV 1+ antibody positive HIV-2 antibody negative CD4 was 10-he was started on Biktarvy and was set up for follow-up At that time she was thought to have suspected PJP, Was sent home on prednisone and Bactrim  Had 2 subsequent admissions 03/08/2018-03/10/2018  On her last admission 4/21 through 424 she had a CT scan which that was more suggestive of enlarged mediastinal bilateral hilar lymph nodes sarcoid    follow-up with Dr. wert of pulmonology--?she had resolving opportunistic infection with the caveat that having just finished prednisone patient may have look better than she really was-he wanted to follow-up with her around the beginning of June but she came back to the hospital before then  she was also referred in the outpatient setting to hematology because of thrombocytopenia and anemia-  Readmitted 5/28 fevers  103 sinus tach sodium 128 hemoglobin 5.1 platelet 14 Received vancomycin cefepime azithromycin and boluses of IV fluid  -- hematology was consulted here because hemoglobin was 5.1 with severe acute thrombocytopenia platelets in the 14 range  Oncology feels not TTP, ID input has been helpful in addition to pulmonology delineating planning  She had a bone marrow biopsy 5/29 showing trilineage forms hemophagocytic cells which was nonspecific  Bronchoscopy performed 6/5== Bordetella bronchiseptica-placed on azithromycin by IDTo complete on 6/12 Cytology did not show any malignant cells see below for further details  Hospital Course:  Atypical infection with Bordetella Bronchiseptica versus sarcoid -off Bactrim--dapsone 6/3 Azithromycin started 6/8 Await cytology from bronchoscopy Platelets have durably improved in addition to fall in leukocytosis, marked improvement in LFTs leading me to believe we are either suppressing some unknown inflammation/autoimmune process-this could be an atypical infection it appears-and we will continue to treat with azithromycin through 6/12 as above Acute anemia thrombocytopenia Irregular periods on Depo-Provera since December 2019 for contraception, not menorrhagia Till date 4 units PRBC, 3 units of platelets Hepatosplenomegaly noted on CT 6/4 Counts improved over course of hospital stay--feel related to infectious process AKI on admission Metabolic acidosis non-anion gap-improved to some degree  BUN/creatinine 26/1.1 and now resolved Slightly elevated LFTs Etiology unclear-possibly infectious-trend of LFTs: Since 6/5 to near normal levels 6/8 HGSIL Needs outpatient follow-up patient states has this in early July and should keep appointment HIV AIDS RNA quant <20 fungal culture is negative AFB is negative, blood cultures are negative Defer to ID-continue Biktarvy, dapsone as above Former tobacco abuse and  ~2018   Consultants:  ID  Hematology  Pulmonology  Procedures:  Bone marrow biopsy    Bronchoalveolar lavage  Antimicrobials:  Dapsone Biktarvy  Azithromycin ending on 6/12  Discharge Exam: Vitals:   07/16/18 0459 07/16/18 1237  BP: 105/80 98/66  Pulse: 90 (!) 102  Resp: 18 12  Temp: 98 F (36.7 C) 98.1 F (36.7 C)  SpO2: 100% 100%    Awake alert pleasant no distress extraocular movements intact no icterus no pallor Chest is clear Face has varruca and irregularities Abdomen is slightly distended no rebound no guarding No lower extremity edema Neurologically intact   Discharge Instructions Discharge Instructions    Diet - low sodium heart healthy   Complete by:  As directed    Discharge instructions   Complete by:  As directed    Please complete 4 more days of azithromycin and continue steroids without tapering until you see the lung doctors I would recommend you also follow-up with your infectious disease and dermatology doctors You will need obviously lab work from your infectious disease doctors in the next couple of weeks and they will call you for follow-up   Increase activity slowly   Complete by:  As directed      Allergies as of 07/16/2018      Reactions   Heparin       Medication List    TAKE these medications   acetaminophen 325 MG tablet Commonly known as:  TYLENOL Take 650 mg by mouth every 6 (six) hours as needed for mild pain or headache.   azithromycin 250 MG tablet Commonly known as:  ZITHROMAX Finish course Start taking on:  July 17, 2018   bictegravir-emtricitabine-tenofovir AF 50-200-25 MG Tabs tablet Commonly known as:  Biktarvy Take 1 tablet by mouth daily.   dapsone 100 MG tablet Take 1 tablet (100 mg total) by mouth daily.   ondansetron 4 MG tablet Commonly known as:  ZOFRAN Take 1 tablet (4 mg total) by mouth every 6 (six) hours.   predniSONE 20 MG tablet Commonly known as:  DELTASONE Take 2 tablets (40 mg  total) by mouth daily with breakfast. Start taking on:  July 17, 2018   sodium-potassium bicarbonate Tbef dissolvable tablet Commonly known as:  ALKA-SELTZER GOLD Take 1 tablet by mouth daily as needed (headache/pain).   Theraflu ExpressMax 12.5-5-325 MG/15ML Liqd Generic drug:  diphenhydrAMINE-PE-APAP Take 15 mLs by mouth as needed (flu like symptoms).      Allergies  Allergen Reactions  . Heparin    Follow-up Information    Walsh, Elizabeth W, NP Follow up on 07/30/2018.   Specialty:  Pulmonary Disease Why:  Follow up with lung doctors at 2:30 pm. Contact information: [address] [place] [address] [phone]            The results of significant diagnostics from this hospitalization (including imaging, microbiology, ancillary and laboratory) are listed below for reference.    Significant Diagnostic Studies: Dg Chest 2 View  Result Date: 06/18/2018 CLINICAL DATA:  Dyspnea on exertion EXAM: CHEST - 2 VIEW COMPARISON:  05/28/2018 FINDINGS: Cardiac shadow is within normal limits. The reticulonodular densities seen on prior examination have nearly completely resolved. No sizable infiltrate or effusion is seen. No bony abnormality is noted. IMPRESSION: Near complete resolution of previously seen reticulonodular densities. Electronically Signed   By: Mark  Lukens M.D.   On: 06/18/2018 15:47   Ct Chest W Contrast  Result Date: 07/04/2018 CLINICAL DATA:  Increased shortness of breath EXAM: CT CHEST WITH CONTRAST TECHNIQUE: Multidetector CT imaging of the chest was performed during intravenous contrast   administration. CONTRAST:  75mL OMNIPAQUE IOHEXOL 300 MG/ML  SOLN COMPARISON:  05/28/2018 FINDINGS: Cardiovascular: No significant vascular findings. Normal heart size. No pericardial effusion. Mediastinum/Nodes: Stable mediastinal and bilateral hilar lymphadenopathy measuring up to 17 mm in short axis along the right paratracheal location. Normal trachea. Normal  esophagus. Normal thyroid gland. Lungs/Pleura: Diffuse bilateral interstitial thickening and fissural thickening. Peripheral sub pleural thickening again noted bilaterally. Abnormality involves bilateral upper and lower lungs with the lung bases least affected. Trace left pleural effusion. No right pleural effusion. Upper Abdomen: No acute abnormality. Musculoskeletal: No chest wall abnormality. No acute or significant osseous findings. IMPRESSION: 1. Worsening diffuse bilateral interstitial and fissural thickening with sub pleural thickening and scattered areas of ground-glass opacity. Differential considerations include chronic interstitial lung disease such as sarcoidosis versus atypical infection given the patient's history of HIV. Electronically Signed   By: Hetal  Patel   On: 07/04/2018 18:34   Ct Abdomen Pelvis W Contrast  Result Date: 07/11/2018 CLINICAL DATA:  Hepatomegaly.  Patient has no current complaints. EXAM: CT ABDOMEN AND PELVIS WITH CONTRAST TECHNIQUE: Multidetector CT imaging of the abdomen and pelvis was performed using the standard protocol following bolus administration of intravenous contrast. CONTRAST:  100mL OMNIPAQUE IOHEXOL 300 MG/ML SOLN, 30mL OMNIPAQUE IOHEXOL 300 MG/ML SOLN COMPARISON:  CT chest dated Jul 04, 2018. FINDINGS: Lower chest: Improving septal thickening and ground-glass density at the lung bases. Resolved pleural effusions. Hepatobiliary: Mild hepatomegaly. No focal liver abnormality. The gallbladder is unremarkable. No biliary dilatation. Pancreas: Unremarkable. No pancreatic ductal dilatation or surrounding inflammatory changes. Spleen: Mild splenomegaly.  No focal abnormality. Adrenals/Urinary Tract: Adrenal glands are unremarkable. Kidneys are normal, without renal calculi, focal lesion, or hydronephrosis. Bladder is unremarkable. Stomach/Bowel: Small hiatal hernia. The stomach is otherwise within normal limits. No bowel wall thickening, distention, or surrounding  inflammatory changes. Normal appendix. Vascular/Lymphatic: No significant vascular findings are present. No enlarged abdominal or pelvic lymph nodes. Reproductive: Uterus and bilateral adnexa are unremarkable. Tampon in the vagina. Other: Trace free fluid in the pelvis is likely physiologic. No pneumoperitoneum. Musculoskeletal: No acute or significant osseous findings. Degenerative disc disease at L5-S1. IMPRESSION: 1. Mild hepatosplenomegaly.  No focal abnormality. 2. Improving septal thickening and ground-glass density at the lung bases, favor resolving atypical infection given history of HIV. Electronically Signed   By: William T Derry M.D.   On: 07/11/2018 18:16   Ct Biopsy  Result Date: 07/05/2018 INDICATION: History of HIV/AIDS, now with anemia and thrombocytopenia. Please perform CT-guided bone marrow biopsy for tissue diagnostic purposes. Please obtain a sample for additional culture analysis. EXAM: CT-GUIDED BONE MARROW BIOPSY AND ASPIRATION MEDICATIONS: None ANESTHESIA/SEDATION: Versed 2 mg IV Sedation Time: 10 Minutes; The patient was continuously monitored during the procedure by the interventional radiology nurse under my direct supervision. COMPLICATIONS: None immediate. PROCEDURE: Informed consent was obtained from the patient following an explanation of the procedure, risks, benefits and alternatives. The patient understands, agrees and consents for the procedure. All questions were addressed. A time out was performed prior to the initiation of the procedure. The patient was positioned prone and non-contrast localization CT was performed of the pelvis to demonstrate the iliac marrow spaces. The operative site was prepped and draped in the usual sterile fashion. Under sterile conditions and local anesthesia, a 22 gauge spinal needle was utilized for procedural planning. Next, an 11 gauge coaxial bone biopsy needle was advanced into the left iliac marrow space. Needle position was confirmed with  CT imaging. Initially, bone marrow aspiration was performed.   Next, a bone marrow biopsy was obtained with the 11 gauge outer bone marrow device. Samples were prepared with the cytotechnologist and deemed adequate. The needle was removed intact. Hemostasis was obtained with compression and a dressing was placed. The patient tolerated the procedure well without immediate post procedural complication. IMPRESSION: Successful CT guided left iliac bone marrow aspiration and core biopsy. Note, a portion of the core biopsy with set aside for culture analysis. Electronically Signed   By: John  Watts M.D.   On: 07/05/2018 11:43   Dg Chest Port 1 View  Result Date: 07/12/2018 CLINICAL DATA:  Status post endobronchial ultrasound in aspiration. EXAM: PORTABLE CHEST 1 VIEW COMPARISON:  Radiograph of Jul 04, 2018. FINDINGS: The heart size and mediastinal contours are within normal limits. Both lungs are clear. No pneumothorax or pleural effusion is noted. The visualized skeletal structures are unremarkable. IMPRESSION: No active disease. Electronically Signed   By: James  Green Jr M.D.   On: 07/12/2018 15:22   Dg Chest Portable 1 View  Result Date: 07/04/2018 CLINICAL DATA:  Worsening shortness of breath and cough EXAM: PORTABLE CHEST 1 VIEW COMPARISON:  06/18/2018 FINDINGS: There is bilateral diffuse reticular interstitial thickening. There is no focal consolidation. There is no pleural effusion or pneumothorax. The heart and mediastinal contours are unremarkable. The osseous structures are unremarkable. IMPRESSION: Bilateral diffuse reticular interstitial thickening which may reflect mild interstitial edema versus pneumonia including atypical pneumonia. Electronically Signed   By: Hetal  Patel   On: 07/04/2018 12:57   Ct Bone Marrow Biopsy & Aspiration  Result Date: 07/05/2018 INDICATION: History of HIV/AIDS, now with anemia and thrombocytopenia. Please perform CT-guided bone marrow biopsy for tissue diagnostic  purposes. Please obtain a sample for additional culture analysis. EXAM: CT-GUIDED BONE MARROW BIOPSY AND ASPIRATION MEDICATIONS: None ANESTHESIA/SEDATION: Versed 2 mg IV Sedation Time: 10 Minutes; The patient was continuously monitored during the procedure by the interventional radiology nurse under my direct supervision. COMPLICATIONS: None immediate. PROCEDURE: Informed consent was obtained from the patient following an explanation of the procedure, risks, benefits and alternatives. The patient understands, agrees and consents for the procedure. All questions were addressed. A time out was performed prior to the initiation of the procedure. The patient was positioned prone and non-contrast localization CT was performed of the pelvis to demonstrate the iliac marrow spaces. The operative site was prepped and draped in the usual sterile fashion. Under sterile conditions and local anesthesia, a 22 gauge spinal needle was utilized for procedural planning. Next, an 11 gauge coaxial bone biopsy needle was advanced into the left iliac marrow space. Needle position was confirmed with CT imaging. Initially, bone marrow aspiration was performed. Next, a bone marrow biopsy was obtained with the 11 gauge outer bone marrow device. Samples were prepared with the cytotechnologist and deemed adequate. The needle was removed intact. Hemostasis was obtained with compression and a dressing was placed. The patient tolerated the procedure well without immediate post procedural complication. IMPRESSION: Successful CT guided left iliac bone marrow aspiration and core biopsy. Note, a portion of the core biopsy with set aside for culture analysis. Electronically Signed   By: John  Watts M.D.   On: 07/05/2018 11:43    Microbiology: Recent Results (from the past 240 hour(s))  Culture, respiratory (non-expectorated)     Status: None   Collection Time: 07/12/18 11:48 AM  Result Value Ref Range Status   Specimen Description   Final     BRONCHIAL ALVEOLAR LAVAGE RIGHT UPPER LOBE Performed at [facility], [address] [place], [place] [address]    Special Requests   Final    Immunocompromised Performed at [facility], [address] [place], [place] [address]    Gram Stain   Final    FEW WBC PRESENT, PREDOMINANTLY MONONUCLEAR NO ORGANISMS SEEN    Culture   Final    FEW BORDETELLA BRONCHISEPTICA Standardized susceptibility testing for this organism is not available. Performed at [facility], [address] [place], [place] [address]    Report Status 07/15/2018 FINAL  Final  Acid Fast Smear (AFB)     Status: None   Collection Time: 07/12/18 11:48 AM  Result Value Ref Range Status   AFB Specimen Processing Concentration  Final   Acid Fast Smear Negative  Final    Comment: (NOTE) Performed At: [facility] [address] [place], [place] [id number] Nagendra Sanjai MD Ph:[phone]    Source (AFB) BRONCHIAL ALVEOLAR LAVAGE  Final    Comment: RIGHT UPPER LOBE Performed at [facility], [address] [place], [place] [address]   Culture, fungus without smear     Status: None (Preliminary result)   Collection Time: 07/12/18 11:48 AM  Result Value Ref Range Status   Specimen Description   Final    BRONCHIAL ALVEOLAR LAVAGE RIGHT UPPER LOBE Performed at [facility], [address] [place], [place] [address]    Special Requests   Final    Immunocompromised Performed at [facility], [address] [place] , [place] [address]    Culture   Final    No Fungi Isolated in 4 Weeks Performed at [facility], [address] [place], [place] [address]    Report Status PENDING  Incomplete  KOH prep     Status: None   Collection Time: 07/12/18 11:48 AM  Result Value Ref Range Status   Specimen Description   Final    BRONCHIAL ALVEOLAR LAVAGE Performed at [facility], [address] [place], [place] [address]    Special Requests   Final    Immunocompromised Performed at [facility], [address] [place], [place] [address]    KOH Prep   Final    BUDDING YEAST SEEN Performed at [facility], [address] [place],  [address]    Report Status 07/13/2018 FINAL  Final     Labs: Basic Metabolic Panel: Recent Labs  Lab 07/11/18 0848 07/12/18 0812 07/13/18 0852 07/15/18 0817 07/16/18 1031  NA 130* 131* 130* 133* 134*  K 4.7 4.4 4.5 3.8 3.9  CL 106 105 102 103 102  CO2 17* 20* 20* 20* 24  GLUCOSE 184* 149* 345* 227* 257*  BUN 11 9 8 10 13  CREATININE 0.73 0.68 0.64 0.60 0.68  CALCIUM 8.1* 8.3* 8.1* 8.4* 8.4*   Liver Function Tests: Recent Labs  Lab 07/11/18 0848 07/12/18 0812 07/13/18 0852 07/15/18 0817 07/16/18 1031  AST 154* 119* 57* 23 31  ALT 141* 151* 113* 63* 50*  ALKPHOS 205* 197* 192* 148* 136*  BILITOT 0.2* 0.4 0.5 0.5 0.7  PROT 7.0 7.4 7.8 7.4 7.6  ALBUMIN 2.6* 2.8* 2.8* 2.7* 2.9*   No results for input(s): LIPASE, AMYLASE in the last 168 hours. No results for input(s): AMMONIA in the last 168 hours. CBC: Recent Labs  Lab 07/11/18 0848 07/11/18 1109 07/12/18 0828 07/13/18 0852 07/15/18 0817 07/16/18 1031  WBC 10.5 10.7* 12.0* 15.2* 10.6*   6.6  NEUTROABS 6.2  --  7.0 11.2* 7.2 3.8  HGB 11.2* 10.9* 11.0* 10.9* 10.1* 10.2*  HCT 33.9* 34.8* 35.2* 34.4* 32.6* 33.1*  MCV 98.0 99.7 97.8 99.1 101.2* 103.8*  PLT 28* 27* 56* 51* 79* 108*   Cardiac Enzymes: No results for input(s): CKTOTAL, CKMB, CKMBINDEX, TROPONINI in the last 168 hours. BNP: BNP (last 3 results) Recent Labs    03/08/18 2129  BNP 8.8    ProBNP (last 3 results) No results for input(s): PROBNP in the last 8760 hours.  CBG: No results for input(s): GLUCAP in the last 168 hours.     Signed:  Jai-Gurmukh Samtani MD   [facility] 07/16/2018, 1:22 PM   

## 2018-07-16 NOTE — Progress Notes (Signed)
Patient was given discharge instructions with no immediate questions or concerns. Patient will be taken downstairs via wheelchair for discharge.

## 2018-07-16 NOTE — Progress Notes (Signed)
Antimicrobial Stewardship - Brief Note  Patient with B. Bronchiseptica isolated from BAL.  Case discussed with ID and plan to change azithromycin to doxycyline.   Plan:  Was able to change azithromycin to doxycyline prior to leaving hospital and discuss change with patient.  New prescription sent to [facility] outpatient pharmacy for doxycyline 152m PO BID x 5 days as per Dr. CMegan Salon  DDoreene Eland PharmD, BCPS.   Work Cell: [phone]/10/2018 3:08 PM

## 2018-07-17 ENCOUNTER — Encounter: Payer: Self-pay | Admitting: Hematology

## 2018-07-17 NOTE — Progress Notes (Signed)
Patient ID: Krystal Short, female   DOB: 01-13-1986, 33 y.o.   MRN: [id number]          [facility] Infectious Disease    Date of Admission:  07/04/2018     Krystal Short's BAL culture grew Bordetella bronchiseptica, the usual because of the canine kennel cough.  Upon further investigation it appears that this species of Bordetella is frequently resistant to macrolide antibiotics.  Therefore I asked our ID pharmacist, Mina Marble, to change azithromycin to doxycycline just before her discharge yesterday.  I am not absolutely certain that Bordetella is the cause of her recent, recurrent febrile respiratory process but feel a 5-day course of therapy is warranted.  The trimethoprim sulfamethoxazole that she has received is also likely to have been helping clinically if this is the cause (she is now on dapsone).  I will arrange follow-up in our clinic next week.         Michel Bickers, MD [facility] for [facility] [place] [facility] [phone] pager   [phone] cell 07/17/2018, 3:21 PM

## 2018-07-18 ENCOUNTER — Encounter: Payer: Self-pay | Admitting: *Deleted

## 2018-07-18 ENCOUNTER — Other Ambulatory Visit: Payer: Self-pay | Admitting: *Deleted

## 2018-07-18 NOTE — Patient Outreach (Addendum)
[facility]) Care Management  07/18/2018  Krystal Short [birth year] [id number]  Transition of care call   Referral received: 07/05/18 Initial outreach: 07/18/18 Insurance: [place] Choice Plan   Subjective: Initial successful telephone call to patient's mobile number in order to complete transition of care assessment; 2 HIPAA identifiers verified. Explained purpose of call and completed transition of care assessment.  Krystal Short, a [place], states she is feeling better, still has a cough usually in the morning, she denies fever, chills or aches, or pain,  tolerating diet, denies bowel or bladder problems.  Spouse is assisting with her recovery.  She verifies that she has scheduled follow up appointments with her infectious disease and pulmonary providers. She says she can't drive so her husband provides transportation or she pays for a Lyft.    Objective:  [name] was hospitalized at [facility] from 5/28-6/9 for acute respiratory failure with hypoxia, and severe anemia. On 07/12/18 she had a endobronchial ultrasound, video bronchoscopy, fine needle aspiration biopsy and biopsy of bronchial washings.  Bronchial washing showed bordetella bronchiseptica and was treated with azithromycin with course completion on 6/12. Krystal Short also received several units of blood and platelets as her Hgb on admission was 5.1 and her platelet count was 12. Comorbidities include: HIV/AIDS, anemia, hilar adenopathy, molluscum contagiosum and thrombocytopenia   Assessment:  Patient voices good understanding of all discharge instructions.  See transition of care flowsheet for assessment details.   Plan:  Reviewed hospital discharge diagnosis of acute respiratory failure,  severe anemia, bordetella bronchiseptica lung infection and thrombocytopenia and treatment plan using hospital discharge instructions, assessing medication adherence, reviewing  problems requiring  provider notification, and discussing the importance of follow up with primary care provider and specialists as directed. No ongoing care management needs identified so will close case to [place] [facility] care management services and route successful outreach letter with [place] Management pamphlet and 24 Hour Nurse Line Magnet to [place] [facility] clinical pool to be mailed to patient's home address.   Barrington Ellison RN,CCM,CDE [place] Management Coordinator Office Phone [phone] Office Fax [phone]

## 2018-07-19 ENCOUNTER — Telehealth: Payer: Self-pay | Admitting: Family

## 2018-07-19 NOTE — Telephone Encounter (Signed)
COVID-19 Pre-Screening Questions: ° °Do you currently have a fever (>100 °F), chills or unexplained body aches? No   ° °Are you currently experiencing new cough, shortness of breath, sore throat, runny nose? No   °•  °Have you recently travelled outside the state of [place] in the last 14 days? no °•  °1. Have you been in contact with someone that is currently pending confirmation of Covid19 testing or has been confirmed to have the Covid19 virus?  No  ° °

## 2018-07-22 ENCOUNTER — Ambulatory Visit: Payer: 59 | Admitting: Family

## 2018-07-22 ENCOUNTER — Other Ambulatory Visit: Payer: Self-pay

## 2018-07-22 ENCOUNTER — Encounter: Payer: Self-pay | Admitting: Family

## 2018-07-22 VITALS — BP 113/80 | HR 114 | Temp 98.1°F | Wt 124.0 lb

## 2018-07-22 DIAGNOSIS — B20 Human immunodeficiency virus [HIV] disease: Secondary | ICD-10-CM

## 2018-07-22 DIAGNOSIS — D696 Thrombocytopenia, unspecified: Secondary | ICD-10-CM | POA: Diagnosis not present

## 2018-07-22 DIAGNOSIS — D649 Anemia, unspecified: Secondary | ICD-10-CM

## 2018-07-22 DIAGNOSIS — J9601 Acute respiratory failure with hypoxia: Secondary | ICD-10-CM | POA: Diagnosis not present

## 2018-07-22 NOTE — Patient Instructions (Signed)
Nice to see you.  Please continue to take your Biktarvy and Dapsone.  Continue with the prednisone until seen by Pulmonology  Refills of medication will be sent to the pharmacy.  You can postpone your upcoming appointment for another month.  Please let us know if you have any questions.   Have a great day and stay safe!

## 2018-07-22 NOTE — Progress Notes (Signed)
Subjective:    Patient ID: Krystal Short, female    DOB: 10-Oct-1985, 33 y.o.   MRN: [id number]  No chief complaint on file.    HPI:  Krystal Short is a 33 y.o. female with AIDS was last seen in the office on 06/20/18 for hospitalization follow up with concern for recurrent PCP pneumonia versus sarcoidosis. She has had well controlled HIV/AIDS with good adherence and tolerance to her ART regimen of Biktarvy. CD4 count remains low <35 at the time. Since her last office visit she was once again hospitalized with worsening dyspnea on exertion, cough and fevers at home. Chest x-rays were concerning for atypical infection versus sarcoidosis. Given her continued anemia a bone biopsy was performed and found to be unremarkable with low likelihood of opportunistic infection. Bronchoscopy was performed a grew insignificant colonizing Candida and BAL fluid cultures grew Bordetalla bronchiseptica, the cause of kennel cough. These species are susceptible to Bactrim for which she had been previously treated for PCP pneumonia shortly before. It was found to be resistant to azithromycin and she was changed to a 5 day course of doxycycline. She was clinically improved with steroids with presumed diagnosis of sarcoidosis. Also continued on her dapsone and Biktarvy. Surgical pathology with no malignancy identified and scant lung parachemya with fibrosis and reactive changes. All hospital records, labs and imaging were reviewed in detail.   [name] has been doing well since leaving the hospital. She continues to have good adherence and tolerance to her ART regimen of Biktarvy supplemented with dapsone for OI prophylaxis. She has completed the doxycycline about 3 days ago now and continues to take 40 mg of prednisone daily. She has not had any fevers, chills or sweats. Symptoms appear improved and she is eating and drinking well at present. Follow up with pulmonology is in 1 week.    Allergies  Allergen Reactions     Heparin       Outpatient Medications Prior to Visit  Medication Sig Dispense Refill   acetaminophen (TYLENOL) 325 MG tablet Take 650 mg by mouth every 6 (six) hours as needed for mild pain or headache.     bictegravir-emtricitabine-tenofovir AF (BIKTARVY) 50-200-25 MG TABS tablet Take 1 tablet by mouth daily. 30 tablet 3   dapsone 100 MG tablet Take 1 tablet (100 mg total) by mouth daily. 30 tablet 0   diphenhydrAMINE-PE-APAP (THERAFLU EXPRESSMAX) 12.5-5-325 MG/15ML LIQD Take 15 mLs by mouth as needed (flu like symptoms).     ondansetron (ZOFRAN) 4 MG tablet Take 1 tablet (4 mg total) by mouth every 6 (six) hours. 12 tablet 0   predniSONE (DELTASONE) 20 MG tablet Take 2 tablets (40 mg total) by mouth daily with breakfast. 30 tablet 0   sodium-potassium bicarbonate (ALKA-SELTZER GOLD) TBEF dissolvable tablet Take 1 tablet by mouth daily as needed (headache/pain).     No facility-administered medications prior to visit.      Past Medical History:  Diagnosis Date   Acute hyponatremia 06/03/2017   Anemia    CAP (community acquired pneumonia) 06/03/2017   Community acquired pneumonia 06/05/2017   Facial dermatitis 06/03/2017   HIV (human immunodeficiency virus infection) ([place])    Pleurisy 06/03/2017   Pneumonia 06/06/2017   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 ([place])    Thrush of mouth and esophagus ([place])    UTI (urinary tract infection)      Past Surgical History:  Procedure Laterality Date   BIOPSY  07/12/2018   Procedure: BIOPSY;  Surgeon:  Laurin Coder, MD;  Location: [facility];  Service: Endoscopy;;   BRONCHIAL WASHINGS  07/12/2018   Procedure: BRONCHIAL WASHINGS;  Surgeon: Laurin Coder, MD;  Location: [facility];  Service: Endoscopy;;   ENDOBRONCHIAL ULTRASOUND N/A 07/12/2018   Procedure: ENDOBRONCHIAL ULTRASOUND;  Surgeon: Laurin Coder, MD;  Location: [facility];  Service: Endoscopy;  Laterality: N/A;   FINE NEEDLE  ASPIRATION BIOPSY  07/12/2018   Procedure: FINE NEEDLE ASPIRATION BIOPSY;  Surgeon: Laurin Coder, MD;  Location: [facility];  Service: Endoscopy;;   NO PAST SURGERIES     VIDEO BRONCHOSCOPY N/A 07/12/2018   Procedure: VIDEO BRONCHOSCOPY WITHOUT FLUORO;  Surgeon: Laurin Coder, MD;  Location: [facility];  Service: Endoscopy;  Laterality: N/A;       Review of Systems  Constitutional: Negative for appetite change, chills, diaphoresis, fatigue, fever and unexpected weight change.  Eyes:       Negative for acute change in vision  Respiratory: Negative for cough, chest tightness, shortness of breath and wheezing.   Cardiovascular: Negative for chest pain.  Gastrointestinal: Negative for diarrhea, nausea and vomiting.  Genitourinary: Negative for dysuria, pelvic pain and vaginal discharge.  Musculoskeletal: Negative for neck pain and neck stiffness.  Skin: Negative for rash.  Neurological: Negative for seizures, syncope, weakness and headaches.  Hematological: Negative for adenopathy. Does not bruise/bleed easily.  Psychiatric/Behavioral: Negative for hallucinations.      Objective:    BP 113/80    Pulse (!) 114    Temp 98.1 F (36.7 C)    Wt 124 lb (56.2 kg)    BMI 22.68 kg/m  Nursing note and vital signs reviewed.  Physical Exam Constitutional:      General: She is not in acute distress.    Appearance: She is well-developed.     Comments: Seated in the chair; pleasant.   Eyes:     Conjunctiva/sclera: Conjunctivae normal.  Neck:     Musculoskeletal: Neck supple.  Cardiovascular:     Rate and Rhythm: Normal rate and regular rhythm.     Heart sounds: Normal heart sounds. No murmur. No friction rub. No gallop.   Pulmonary:     Effort: Pulmonary effort is normal. No respiratory distress.     Breath sounds: Normal breath sounds. No wheezing or rales.  Chest:     Chest wall: No tenderness.  Abdominal:     General: Bowel sounds are normal.     Palpations: Abdomen is  soft.     Tenderness: There is no abdominal tenderness.  Lymphadenopathy:     Cervical: No cervical adenopathy.  Skin:    General: Skin is warm and dry.     Findings: No rash.  Neurological:     Mental Status: She is alert and oriented to person, place, and time.  Psychiatric:        Mood and Affect: Mood normal.        Assessment & Plan:   Problem List Items Addressed This Visit      Respiratory   Acute respiratory failure with hypoxia ([place])    Improved since leaving this hospital with stable vital signs and improved breathing. Favoring sarcoidosis or autoimmune as she continues to be improved while on prednisone. Would not anticipate such improvement with any opportunistic infections. Continue 40 mg of prednisone daily until follow up with pulmonology.       Relevant Orders   COMPLETE METABOLIC PANEL WITH GFR     Other   AIDS (acquired immune deficiency  syndrome) ([place]) - Primary    Well controlled at present with good adherence and tolerance to her ART regimen of Biktarvy supplemented with Dapsone for OI prophylaxis. She currently has no signs/symptoms concerning for opportunistic infection. Continue current dose of Biktarvy.       Symptomatic anemia    Appears improved today and asymptomatic. Will check CBC w/diff.       Relevant Orders   CBC w/Diff   Thrombocytopenia [facility]    Hematology following. Will check CBC w/diff today.       Relevant Orders   COMPLETE METABOLIC PANEL WITH GFR       I am having Vitoria Nakayama maintain her ondansetron, acetaminophen, bictegravir-emtricitabine-tenofovir AF, sodium-potassium bicarbonate, diphenhydrAMINE-PE-APAP, dapsone, and predniSONE.   Follow-up: Return in about 2 months (around 09/21/2018), or if symptoms worsen or fail to improve.   Terri Piedra, MSN, FNP-C Nurse Practitioner [facility] for Infectious Disease [place] number: [phone]

## 2018-07-22 NOTE — Assessment & Plan Note (Signed)
Appears improved today and asymptomatic. Will check CBC w/diff.

## 2018-07-22 NOTE — Assessment & Plan Note (Signed)
Well controlled at present with good adherence and tolerance to her ART regimen of Biktarvy supplemented with Dapsone for OI prophylaxis. She currently has no signs/symptoms concerning for opportunistic infection. Continue current dose of Biktarvy.

## 2018-07-22 NOTE — Assessment & Plan Note (Signed)
Improved since leaving this hospital with stable vital signs and improved breathing. Favoring sarcoidosis or autoimmune as she continues to be improved while on prednisone. Would not anticipate such improvement with any opportunistic infections. Continue 40 mg of prednisone daily until follow up with pulmonology.

## 2018-07-22 NOTE — Assessment & Plan Note (Signed)
Hematology following. Will check CBC w/diff today.

## 2018-07-23 LAB — CBC WITH DIFFERENTIAL/PLATELET
Absolute Monocytes: 422 cells/uL (ref 200–950)
Basophils Absolute: 7 cells/uL (ref 0–200)
Basophils Relative: 0.1 %
Eosinophils Absolute: 0 cells/uL — ABNORMAL LOW (ref 15–500)
Eosinophils Relative: 0 %
HCT: 30.2 % — ABNORMAL LOW (ref 35.0–45.0)
Hemoglobin: 10.1 g/dL — ABNORMAL LOW (ref 11.7–15.5)
Lymphs Abs: 791 cells/uL — ABNORMAL LOW (ref 850–3900)
MCH: 32 pg (ref 27.0–33.0)
MCHC: 33.4 g/dL (ref 32.0–36.0)
MCV: 95.6 fL (ref 80.0–100.0)
MPV: 11.5 fL (ref 7.5–12.5)
Monocytes Relative: 6.3 %
Neutro Abs: 5481 cells/uL (ref 1500–7800)
Neutrophils Relative %: 81.8 %
Platelets: 204 10*3/uL (ref 140–400)
RBC: 3.16 10*6/uL — ABNORMAL LOW (ref 3.80–5.10)
RDW: 19.1 % — ABNORMAL HIGH (ref 11.0–15.0)
Total Lymphocyte: 11.8 %
WBC: 6.7 10*3/uL (ref 3.8–10.8)

## 2018-07-23 LAB — COMPLETE METABOLIC PANEL WITH GFR
AG Ratio: 0.9 (calc) — ABNORMAL LOW (ref 1.0–2.5)
ALT: 25 U/L (ref 6–29)
AST: 21 U/L (ref 10–30)
Albumin: 3.5 g/dL — ABNORMAL LOW (ref 3.6–5.1)
Alkaline phosphatase (APISO): 91 U/L (ref 31–125)
BUN: 11 mg/dL (ref 7–25)
CO2: 23 mmol/L (ref 20–32)
Calcium: 8.9 mg/dL (ref 8.6–10.2)
Chloride: 100 mmol/L (ref 98–110)
Creat: 0.79 mg/dL (ref 0.50–1.10)
GFR, Est African American: 116 mL/min/{1.73_m2} (ref >=60)
GFR, Est Non African American: 100 mL/min/{1.73_m2} (ref >=60)
Globulin: 3.9 g/dL (calc) — ABNORMAL HIGH (ref 1.9–3.7)
Glucose, Bld: 193 mg/dL — ABNORMAL HIGH (ref 65–99)
Potassium: 4.5 mmol/L (ref 3.5–5.3)
Sodium: 135 mmol/L (ref 135–146)
Total Bilirubin: 1.3 mg/dL — ABNORMAL HIGH (ref 0.2–1.2)
Total Protein: 7.4 g/dL (ref 6.1–8.1)

## 2018-07-26 MED FILL — DAPSONE 100 MG TABLET: 100 | 30 days supply | Qty: 30 | Fill #0

## 2018-07-26 MED FILL — BIKTARVY 50-200-25 MG TABS: 50-200-25 | 30 days supply | Qty: 30 | Fill #1

## 2018-07-29 ENCOUNTER — Telehealth: Payer: Self-pay | Admitting: *Deleted

## 2018-07-29 ENCOUNTER — Telehealth: Payer: Self-pay | Admitting: Pulmonary Disease

## 2018-07-29 NOTE — Telephone Encounter (Signed)
Patient called to advise she has a follow up with pulmonology but she has had a low grade fever since Friday 07/26/18 and shortness of breath today. She has been trying to call them all day and not able to get a call back as she wants to let them know about the fever and see what their policy is due to Covid. Advised I will call to see if I can help by getting someone to call her.   Called pulmonology and gave them the information and was told someone will give them a call.

## 2018-07-29 NOTE — Telephone Encounter (Signed)
Fever since Friday between 100-103 (taking Tylenol and IBU), increased SHOB since Thursday.  Dry cough, tightness in chest.  Denies n/v/d.  Did travel to [facility] 6/13-6/14 and stayed overnight - wore mask while out.  Walked on [employer]  Stayed with spouse in the room.  Chills, night sweats, loss of appetite.  Changed office visit to a video visit with [facility] NP  Nothing further needed; will sign off.

## 2018-07-30 ENCOUNTER — Ambulatory Visit (INDEPENDENT_AMBULATORY_CARE_PROVIDER_SITE_OTHER): Payer: 59 | Admitting: Primary Care

## 2018-07-30 ENCOUNTER — Encounter: Payer: Self-pay | Admitting: Primary Care

## 2018-07-30 ENCOUNTER — Other Ambulatory Visit: Payer: 59

## 2018-07-30 ENCOUNTER — Other Ambulatory Visit: Payer: Self-pay

## 2018-07-30 ENCOUNTER — Telehealth: Payer: Self-pay

## 2018-07-30 DIAGNOSIS — R6889 Other general symptoms and signs: Secondary | ICD-10-CM | POA: Diagnosis not present

## 2018-07-30 DIAGNOSIS — Z20822 Contact with and (suspected) exposure to covid-19: Secondary | ICD-10-CM

## 2018-07-30 DIAGNOSIS — R918 Other nonspecific abnormal finding of lung field: Secondary | ICD-10-CM

## 2018-07-30 MED ORDER — PREDNISONE 20 MG PO TABS: ORAL_TABLET | ORAL | 0 refills | Status: DC

## 2018-07-30 MED ORDER — AZITHROMYCIN 250 MG PO TABS: ORAL_TABLET | ORAL | 0 refills | Status: DC

## 2018-07-30 NOTE — Progress Notes (Signed)
Virtual Visit via Telephone Note  I connected with Krystal Short on 07/30/18 at  9:30 AM EDT by telephone and verified that I am speaking with the correct person using two identifiers.  Location: Patient: Home Provider: Office   I discussed the limitations, risks, security and privacy concerns of performing an evaluation and management service by telephone and the availability of in person appointments. I also discussed with the patient that there may be a patient responsible charge related to this service. The patient expressed understanding and agreed to proceed.   History of Present Illness: 33 year old female, former smoker. PMH significant for AIDS, pulmonary infiltrates, hilar adenopathy, thrombocytopenia, anemia, hemophagocytosis present in bone marrow. Patient of Dr. Melvyn Novas, seen for initial consult on 06/18/18. CT chest in April showed enlarged mediastinal bilateral hilar lymph nodes suggestive of sarcoid versus atypical infection. Recent hospitalization for acute respiratory failure with hypoxia.  Hospital course 5/28-07/16/18: Patient presented to ED with fever and tachycardia. Sodium was 128, Hgb 5.1 and severe thrombocytopenia with plt in 14. Oncology did not feels TTP. Bone marrow biopsy showed trilineage forms hemophagocytic cells which was nonspecific. Bronchoscopy on 6/5 positive for bordella bronchiseptica. Started on azithromycin per ID on 6/8. Discharged on prednisone 67m daily.   07/30/2018 Patient called today for televisit/hospital follow-up. Complains of fevers 100-103 and shortness of breath x 3 days. Travelled to [place] 6/13-6/14. Temperature this morning was 99.6 without any tylenol. Taking alka-seltzer cold (which has asprin). She has a dry barking cough. She was given doxycycline on discharge from hospital and prednisone 417mdaily until follow-up. Has an apt with ID on 08/13/18.    Observations/Objective:  - Mild-moderate dyspnea and dry cough  Assessment and  Plan:   Atypical infection with Bordetella bronchiseptica vs sarcoid - Re-occurrence of fever and shortness of breath x 3 days - Sending in RX for azithromycin (zpack) - Continue prednisone 4011md x 10 days; 19m90m10 days; 10mg22m0 days (slow taper) - Encouraged delsym cough syrup twice daily and tessalon perles TID for cough suppression  Fever - Advised Tylenol 650mg 13mhours ATC - Encourage pushing oral fluids - Has an apt for lab draw today  - Repeat COVID testing d/t recent travel   Follow Up Instructions:  -FU with Dr. Wert iMelvyn Novasweeks and ID on 7/7; advised ED if symptoms worsen or fever does not improve  I discussed the assessment and treatment plan with the patient. The patient was provided an opportunity to ask questions and all were answered. The patient agreed with the plan and demonstrated an understanding of the instructions.   The patient was advised to call back or seek an in-person evaluation if the symptoms worsen or if the condition fails to improve as anticipated.  I provided 30 minutes of non-face-to-face time during this encounter.   Krystal Short

## 2018-07-30 NOTE — Telephone Encounter (Signed)
Spoke with patient, scheduled her for COVID 19 testing at [facility] today at 1pm.  Testing protocol reviewed with patient, she expressed understanding.

## 2018-07-30 NOTE — Patient Instructions (Addendum)
RX: - Azithromycin (z-pack as directed) - Refill prednisone (continue 40mg  daily x 10 days; then 20mg  daily x 10 days; then 10mg  daily x 10 days)  Recommendations: -Take delsym cough syrup twice daily  -Take tessalon perles three times daily for cough -Tylenol 650mg  every 6 hours for fever (take around the clock for the next 2-3 days) - ED if shortness of breath or fever worsen   Follow-up: - Infectious disease on 7/7 - Dr. Melvyn Novas in 2 weeks (7/8 at 2:30pm please wear a mask)

## 2018-07-30 NOTE — Telephone Encounter (Signed)
Pt needs tested for Covid-19 please.  She has had fevers for 3-4 days, SOB, chills, cough, traveled, and muscle soreness.  Provider is Geraldo Pitter, NP.  Thank you!

## 2018-08-01 ENCOUNTER — Inpatient Hospital Stay (HOSPITAL_COMMUNITY)
Admission: EM | Admit: 2018-08-01 | Discharge: 2018-08-22 | DRG: 974 | Disposition: A | Discharge status: Home / Self Care | Payer: 59 | Attending: Family Medicine | Admitting: Family Medicine

## 2018-08-01 ENCOUNTER — Other Ambulatory Visit: Payer: Self-pay

## 2018-08-01 ENCOUNTER — Emergency Department (HOSPITAL_COMMUNITY): Payer: 59

## 2018-08-01 DIAGNOSIS — D508 Other iron deficiency anemias: Secondary | ICD-10-CM | POA: Diagnosis not present

## 2018-08-01 DIAGNOSIS — K760 Fatty (change of) liver, not elsewhere classified: Secondary | ICD-10-CM | POA: Diagnosis present

## 2018-08-01 DIAGNOSIS — N939 Abnormal uterine and vaginal bleeding, unspecified: Secondary | ICD-10-CM | POA: Diagnosis not present

## 2018-08-01 DIAGNOSIS — D539 Nutritional anemia, unspecified: Secondary | ICD-10-CM | POA: Diagnosis present

## 2018-08-01 DIAGNOSIS — R17 Unspecified jaundice: Secondary | ICD-10-CM | POA: Diagnosis not present

## 2018-08-01 DIAGNOSIS — I313 Pericardial effusion (noninflammatory): Secondary | ICD-10-CM | POA: Diagnosis present

## 2018-08-01 DIAGNOSIS — R87613 High grade squamous intraepithelial lesion on cytologic smear of cervix (HGSIL): Secondary | ICD-10-CM | POA: Diagnosis present

## 2018-08-01 DIAGNOSIS — Y95 Nosocomial condition: Secondary | ICD-10-CM | POA: Diagnosis present

## 2018-08-01 DIAGNOSIS — R918 Other nonspecific abnormal finding of lung field: Secondary | ICD-10-CM | POA: Diagnosis not present

## 2018-08-01 DIAGNOSIS — Z825 Family history of asthma and other chronic lower respiratory diseases: Secondary | ICD-10-CM

## 2018-08-01 DIAGNOSIS — R6520 Severe sepsis without septic shock: Secondary | ICD-10-CM | POA: Diagnosis present

## 2018-08-01 DIAGNOSIS — Z87891 Personal history of nicotine dependence: Secondary | ICD-10-CM | POA: Diagnosis not present

## 2018-08-01 DIAGNOSIS — M545 Low back pain: Secondary | ICD-10-CM | POA: Diagnosis not present

## 2018-08-01 DIAGNOSIS — B081 Molluscum contagiosum: Secondary | ICD-10-CM | POA: Diagnosis present

## 2018-08-01 DIAGNOSIS — A4189 Other specified sepsis: Principal | ICD-10-CM | POA: Diagnosis present

## 2018-08-01 DIAGNOSIS — R5081 Fever presenting with conditions classified elsewhere: Secondary | ICD-10-CM | POA: Diagnosis not present

## 2018-08-01 DIAGNOSIS — Z21 Asymptomatic human immunodeficiency virus [HIV] infection status: Secondary | ICD-10-CM | POA: Diagnosis not present

## 2018-08-01 DIAGNOSIS — E162 Hypoglycemia, unspecified: Secondary | ICD-10-CM | POA: Diagnosis not present

## 2018-08-01 DIAGNOSIS — J1289 Other viral pneumonia: Secondary | ICD-10-CM | POA: Diagnosis not present

## 2018-08-01 DIAGNOSIS — Z9889 Other specified postprocedural states: Secondary | ICD-10-CM | POA: Diagnosis not present

## 2018-08-01 DIAGNOSIS — J189 Pneumonia, unspecified organism: Secondary | ICD-10-CM | POA: Diagnosis not present

## 2018-08-01 DIAGNOSIS — E871 Hypo-osmolality and hyponatremia: Secondary | ICD-10-CM | POA: Diagnosis not present

## 2018-08-01 DIAGNOSIS — B20 Human immunodeficiency virus [HIV] disease: Secondary | ICD-10-CM | POA: Diagnosis not present

## 2018-08-01 DIAGNOSIS — A419 Sepsis, unspecified organism: Secondary | ICD-10-CM | POA: Diagnosis present

## 2018-08-01 DIAGNOSIS — Z888 Allergy status to other drugs, medicaments and biological substances status: Secondary | ICD-10-CM | POA: Diagnosis not present

## 2018-08-01 DIAGNOSIS — Z7952 Long term (current) use of systemic steroids: Secondary | ICD-10-CM

## 2018-08-01 DIAGNOSIS — R600 Localized edema: Secondary | ICD-10-CM | POA: Diagnosis not present

## 2018-08-01 DIAGNOSIS — R531 Weakness: Secondary | ICD-10-CM | POA: Diagnosis not present

## 2018-08-01 DIAGNOSIS — N179 Acute kidney failure, unspecified: Secondary | ICD-10-CM | POA: Diagnosis present

## 2018-08-01 DIAGNOSIS — B9729 Other coronavirus as the cause of diseases classified elsewhere: Secondary | ICD-10-CM | POA: Diagnosis present

## 2018-08-01 DIAGNOSIS — R000 Tachycardia, unspecified: Secondary | ICD-10-CM | POA: Diagnosis not present

## 2018-08-01 DIAGNOSIS — R05 Cough: Secondary | ICD-10-CM | POA: Diagnosis not present

## 2018-08-01 DIAGNOSIS — R0603 Acute respiratory distress: Secondary | ICD-10-CM | POA: Diagnosis not present

## 2018-08-01 DIAGNOSIS — Z79899 Other long term (current) drug therapy: Secondary | ICD-10-CM

## 2018-08-01 DIAGNOSIS — E440 Moderate protein-calorie malnutrition: Secondary | ICD-10-CM | POA: Diagnosis present

## 2018-08-01 DIAGNOSIS — R509 Fever, unspecified: Secondary | ICD-10-CM | POA: Diagnosis not present

## 2018-08-01 DIAGNOSIS — Z8701 Personal history of pneumonia (recurrent): Secondary | ICD-10-CM | POA: Diagnosis not present

## 2018-08-01 DIAGNOSIS — K219 Gastro-esophageal reflux disease without esophagitis: Secondary | ICD-10-CM | POA: Diagnosis present

## 2018-08-01 DIAGNOSIS — D649 Anemia, unspecified: Secondary | ICD-10-CM | POA: Diagnosis not present

## 2018-08-01 DIAGNOSIS — R079 Chest pain, unspecified: Secondary | ICD-10-CM | POA: Diagnosis present

## 2018-08-01 DIAGNOSIS — B59 Pneumocystosis: Secondary | ICD-10-CM | POA: Diagnosis not present

## 2018-08-01 DIAGNOSIS — D61818 Other pancytopenia: Secondary | ICD-10-CM

## 2018-08-01 DIAGNOSIS — I499 Cardiac arrhythmia, unspecified: Secondary | ICD-10-CM | POA: Diagnosis not present

## 2018-08-01 DIAGNOSIS — M549 Dorsalgia, unspecified: Secondary | ICD-10-CM | POA: Diagnosis not present

## 2018-08-01 DIAGNOSIS — R739 Hyperglycemia, unspecified: Secondary | ICD-10-CM | POA: Diagnosis present

## 2018-08-01 DIAGNOSIS — R0602 Shortness of breath: Secondary | ICD-10-CM | POA: Diagnosis not present

## 2018-08-01 DIAGNOSIS — D6959 Other secondary thrombocytopenia: Secondary | ICD-10-CM | POA: Diagnosis present

## 2018-08-01 DIAGNOSIS — Z6823 Body mass index (BMI) 23.0-23.9, adult: Secondary | ICD-10-CM

## 2018-08-01 DIAGNOSIS — R0789 Other chest pain: Secondary | ICD-10-CM | POA: Diagnosis not present

## 2018-08-01 DIAGNOSIS — F329 Major depressive disorder, single episode, unspecified: Secondary | ICD-10-CM | POA: Diagnosis not present

## 2018-08-01 DIAGNOSIS — B379 Candidiasis, unspecified: Secondary | ICD-10-CM | POA: Diagnosis not present

## 2018-08-01 DIAGNOSIS — E222 Syndrome of inappropriate secretion of antidiuretic hormone: Secondary | ICD-10-CM | POA: Diagnosis present

## 2018-08-01 DIAGNOSIS — E860 Dehydration: Secondary | ICD-10-CM | POA: Diagnosis not present

## 2018-08-01 DIAGNOSIS — J9601 Acute respiratory failure with hypoxia: Secondary | ICD-10-CM | POA: Diagnosis not present

## 2018-08-01 DIAGNOSIS — D696 Thrombocytopenia, unspecified: Secondary | ICD-10-CM | POA: Diagnosis not present

## 2018-08-01 DIAGNOSIS — R093 Abnormal sputum: Secondary | ICD-10-CM | POA: Diagnosis not present

## 2018-08-01 DIAGNOSIS — D640 Hereditary sideroblastic anemia: Secondary | ICD-10-CM | POA: Diagnosis not present

## 2018-08-01 DIAGNOSIS — Z8709 Personal history of other diseases of the respiratory system: Secondary | ICD-10-CM | POA: Diagnosis not present

## 2018-08-01 DIAGNOSIS — R0902 Hypoxemia: Secondary | ICD-10-CM | POA: Diagnosis not present

## 2018-08-01 DIAGNOSIS — K828 Other specified diseases of gallbladder: Secondary | ICD-10-CM | POA: Diagnosis present

## 2018-08-01 DIAGNOSIS — E872 Acidosis: Secondary | ICD-10-CM | POA: Diagnosis present

## 2018-08-01 DIAGNOSIS — D759 Disease of blood and blood-forming organs, unspecified: Secondary | ICD-10-CM | POA: Diagnosis not present

## 2018-08-01 DIAGNOSIS — Z20828 Contact with and (suspected) exposure to other viral communicable diseases: Secondary | ICD-10-CM | POA: Diagnosis present

## 2018-08-01 DIAGNOSIS — R071 Chest pain on breathing: Secondary | ICD-10-CM | POA: Diagnosis not present

## 2018-08-01 MED ORDER — SODIUM CHLORIDE 0.9 % IV BOLUS (SEPSIS): 1000.0000 mL | Freq: Once | INTRAVENOUS | Status: DC

## 2018-08-01 MED ORDER — VANCOMYCIN HCL 10 G IV SOLR
1250.0000 mg | Freq: Once | INTRAVENOUS | Status: AC
  Administered 2018-08-02: 1250 mg via INTRAVENOUS
  Filled 2018-08-01: qty 1250

## 2018-08-01 MED ORDER — ACETAMINOPHEN 325 MG PO TABS
650.0000 mg | ORAL_TABLET | Freq: Once | ORAL | Status: AC
  Administered 2018-08-01: 650 mg via ORAL
  Filled 2018-08-01: qty 2

## 2018-08-01 MED ORDER — LACTATED RINGERS IV BOLUS (SEPSIS): 500.0000 mL | Freq: Once | INTRAVENOUS | Status: DC

## 2018-08-01 MED ORDER — SODIUM CHLORIDE 0.9 % IV BOLUS
1000.0000 mL | Freq: Once | INTRAVENOUS | Status: AC
  Administered 2018-08-01: 1000 mL via INTRAVENOUS

## 2018-08-01 MED ORDER — SODIUM CHLORIDE 0.9 % IV SOLN
2.0000 g | INTRAVENOUS | Status: AC
  Administered 2018-08-01: 2 g via INTRAVENOUS
  Filled 2018-08-01: qty 2

## 2018-08-01 MED ORDER — ACETAMINOPHEN 500 MG PO TABS
1000.0000 mg | ORAL_TABLET | Freq: Once | ORAL | Status: DC
  Filled 2018-08-01: qty 2

## 2018-08-01 MED ORDER — SODIUM CHLORIDE 0.9 % IV BOLUS (SEPSIS): 500.0000 mL | Freq: Once | INTRAVENOUS | Status: DC

## 2018-08-01 MED ORDER — LACTATED RINGERS IV BOLUS (SEPSIS): 1000.0000 mL | Freq: Once | INTRAVENOUS | Status: DC

## 2018-08-01 MED ORDER — LACTATED RINGERS IV BOLUS (SEPSIS): 250.0000 mL | Freq: Once | INTRAVENOUS | Status: DC

## 2018-08-01 MED ORDER — SODIUM CHLORIDE 0.9 % IV BOLUS (SEPSIS): 250.0000 mL | Freq: Once | INTRAVENOUS | Status: DC

## 2018-08-01 NOTE — ED Provider Notes (Signed)
[facility] DEPT Provider Note   CSN: [id number] Arrival date & time: 08/01/18  2204    History   Chief Complaint Chief Complaint  Patient presents with  . Shortness of Breath  . Weakness    HPI Krystal Short is a 33 y.o. female with a history of HIV, sarcoidosis, and hemophagocytosis in the bone marrow presents to the emergency department with a chief complaint of fever.  Patient has had a fever and chills for the last for the last 6 days.  She reports that her fever has not gone below 100 F despite taking Tylenol regularly.  Last dose of Tylenol was at 1700.   She reports that 3 days ago that she developed shortness of breath.  She has had an associated nonproductive cough that is accompanied by chest pain and tightness that resolves along with the coughing episode.  She reports associated headache and nausea, but has had no vomiting or diarrhea.  Reports that she has been feeling generally weak and has barely been out of bed for the last few days.  She has been eating and drinking less today, but has voided approximately 3 times.  She denies urinary symptoms, vaginal discharge or vaginal bleeding, melena, hematochezia, hemoptysis, hematemesis, abdominal pain, nasal congestion, confusion, or neck pain or stiffness.  She reports that she was restarted on 40 mg of prednisone and azithromycin with by pulmonology on 6/23.  Reports that she has been compliant with her home HIV medications.  Per chart review from most recent admission on 5/28:  "She had a bone marrow biopsy 5/29 showing trilineage forms hemophagocytic cells which was nonspecific  Bronchoscopy performed 6/5== Bordetella bronchiseptica-placed on azithromycin by IDTo complete on 6/12 Cytology did not show any malignant cells see below for further details"     The history is provided by the patient and medical records. No language interpreter was used.    Past Medical History:   Diagnosis Date  . Acute hyponatremia 06/03/2017  . Anemia   . CAP (community acquired pneumonia) 06/03/2017  . Community acquired pneumonia 06/05/2017  . Facial dermatitis 06/03/2017  . HIV (human immunodeficiency virus infection) ([place])   . Pleurisy 06/03/2017  . Pneumonia 06/06/2017  . 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 ([place])   . Thrush of mouth and esophagus ([place])   . UTI (urinary tract infection)     Patient Active Problem List   Diagnosis Date Noted  . Fever 08/02/2018  . Hemophagocytosis present in bone marrow ([place])   . Pulmonary infiltrates 07/08/2018  . Hilar adenopathy 07/08/2018  . Acute respiratory failure with hypoxia ([place]) 07/04/2018  . Symptomatic anemia 06/10/2018  . Thrombocytopenia ([place]) 06/10/2018  . ASCUS with positive high risk HPV cervical 06/10/2018  . Medication monitoring encounter 12/31/2017  . Molluscum contagiosum 06/27/2017  . AIDS (acquired immune deficiency syndrome) ([place])   . Esophageal candidiasis [facility])     Past Surgical History:  Procedure Laterality Date  . BIOPSY  07/12/2018   Procedure: BIOPSY;  Surgeon: Laurin Coder, MD;  Location: [facility] ENDOSCOPY;  Service: Endoscopy;;  . BRONCHIAL WASHINGS  07/12/2018   Procedure: BRONCHIAL WASHINGS;  Surgeon: Laurin Coder, MD;  Location: [facility];  Service: Endoscopy;;  . ENDOBRONCHIAL ULTRASOUND N/A 07/12/2018   Procedure: ENDOBRONCHIAL ULTRASOUND;  Surgeon: Laurin Coder, MD;  Location: [facility];  Service: Endoscopy;  Laterality: N/A;  . FINE NEEDLE ASPIRATION BIOPSY  07/12/2018   Procedure: FINE NEEDLE ASPIRATION BIOPSY;  Surgeon: Sherrilyn Rist  A, MD;  Location: [facility];  Service: Endoscopy;;  . NO PAST SURGERIES    . VIDEO BRONCHOSCOPY N/A 07/12/2018   Procedure: VIDEO BRONCHOSCOPY WITHOUT FLUORO;  Surgeon: Laurin Coder, MD;  Location: [facility];  Service: Endoscopy;  Laterality: N/A;     OB History    Gravida  0   Para  0   Term  0   Preterm  0    AB  0   Living  0     SAB  0   TAB  0   Ectopic  0   Multiple  0   Live Births  0            Home Medications    Prior to Admission medications   Medication Sig Start Date End Date Taking? Authorizing Provider  azithromycin (ZITHROMAX) 250 MG tablet Zpack taper as directed 07/30/18  Yes Martyn Ehrich, NP  bictegravir-emtricitabine-tenofovir AF (BIKTARVY) 50-200-25 MG TABS tablet Take 1 tablet by mouth daily. 06/20/18  Yes Golden Circle, FNP  dapsone 100 MG tablet Take 1 tablet (100 mg total) by mouth daily. 07/15/18  Yes Golden Circle, FNP  acetaminophen (TYLENOL) 325 MG tablet Take 650 mg by mouth every 6 (six) hours as needed for mild pain or headache.    [provider]  ondansetron (ZOFRAN) 4 MG tablet Take 1 tablet (4 mg total) by mouth every 6 (six) hours. 05/24/18   Raylene Everts, MD  predniSONE (DELTASONE) 20 MG tablet Take 2 tabs daily x 10 days; then 1 tab daily x 10 days; then half tab daily x 10 days 07/30/18   Martyn Ehrich, NP  sodium-potassium bicarbonate (ALKA-SELTZER GOLD) TBEF dissolvable tablet Take 1 tablet by mouth daily as needed (headache/pain).    [provider]    Family History Family History  Problem Relation Age of Onset  . Healthy Father   . Healthy Mother   . Asthma Paternal Grandmother     Social History Social History   Tobacco Use  . Smoking status: Former Smoker    Packs/day: 0.25    Years: 8.00    Pack years: 2.00    Types: Cigarettes, Cigars    Quit date: 12/25/2016    Years since quitting: 1.6  . Smokeless tobacco: Never Used  Substance Use Topics  . Alcohol use: Yes    Comment: weekend/socially  . Drug use: No     Allergies   Heparin   Review of Systems Review of Systems  Constitutional: Positive for chills, fatigue and fever. Negative for activity change.  HENT: Positive for sore throat. Negative for congestion.   Eyes: Negative for visual disturbance.  Respiratory:  Positive for cough, chest tightness and shortness of breath.   Cardiovascular: Positive for chest pain. Negative for palpitations and leg swelling.  Gastrointestinal: Positive for nausea. Negative for abdominal pain, diarrhea and vomiting.  Genitourinary: Negative for dysuria, frequency, vaginal bleeding and vaginal discharge.  Musculoskeletal: Negative for back pain.  Skin: Negative for rash.  Allergic/Immunologic: Negative for immunocompromised state.  Neurological: Positive for weakness and headaches. Negative for light-headedness.  Psychiatric/Behavioral: Negative for confusion.     Physical Exam Updated Vital Signs BP 99/66   Pulse (!) 127   Temp 99.9 F (37.7 C) (Oral)   Resp 14   Ht 5' 1"  (1.549 m)   Wt 56.7 kg   SpO2 96%   BMI 23.62 kg/m   Physical Exam Vitals signs and nursing note reviewed.  Constitutional:      General: She is not in acute distress.    Appearance: She is ill-appearing, toxic-appearing and diaphoretic.  HENT:     Head: Normocephalic.  Eyes:     Conjunctiva/sclera: Conjunctivae normal.  Neck:     Musculoskeletal: Normal range of motion and neck supple.  Cardiovascular:     Rate and Rhythm: Regular rhythm. Tachycardia present.     Pulses: Normal pulses.     Heart sounds: Normal heart sounds. No murmur. No friction rub. No gallop.   Pulmonary:     Effort: Pulmonary effort is normal. Tachypnea present. No accessory muscle usage or respiratory distress.     Breath sounds: Normal breath sounds. No wheezing, rhonchi or rales.  Chest:     Chest wall: No tenderness.  Abdominal:     General: There is no distension.     Palpations: Abdomen is soft.  Musculoskeletal:     Right lower leg: She exhibits no tenderness. No edema.     Left lower leg: She exhibits no tenderness. No edema.  Skin:    General: Skin is warm.     Findings: No ecchymosis or rash.  Neurological:     Mental Status: She is alert.  Psychiatric:        Behavior: Behavior normal.       ED Treatments / Results  Labs (all labs ordered are listed, but only abnormal results are displayed) Labs Reviewed  LACTIC ACID, PLASMA - Abnormal; Notable for the following components:      Result Value   Lactic Acid, Venous 2.7 (*)    All other components within normal limits  COMPREHENSIVE METABOLIC PANEL - Abnormal; Notable for the following components:   Sodium 126 (*)    Chloride 93 (*)    CO2 20 (*)    Glucose, Bld 107 (*)    BUN 25 (*)    Creatinine, Ser 1.23 (*)    Calcium 7.5 (*)    Albumin 2.0 (*)    AST 44 (*)    Total Bilirubin 1.9 (*)    GFR calc non Af Amer 58 (*)    All other components within normal limits  CBC WITH DIFFERENTIAL/PLATELET - Abnormal; Notable for the following components:   WBC 16.0 (*)    RBC 1.87 (*)    Hemoglobin 6.2 (*)    HCT 19.5 (*)    MCV 104.3 (*)    RDW 20.2 (*)    Platelets 22 (*)    nRBC 0.6 (*)    Neutro Abs 11.8 (*)    Abs Immature Granulocytes 2.35 (*)    All other components within normal limits  LACTATE DEHYDROGENASE - Abnormal; Notable for the following components:   LDH 487 (*)    All other components within normal limits  RETICULOCYTES - Abnormal; Notable for the following components:   RBC. 1.86 (*)    All other components within normal limits  SARS CORONAVIRUS 2 (HOSPITAL ORDER, [facility])  CULTURE, BLOOD (ROUTINE X 2)  CULTURE, BLOOD (ROUTINE X 2)  SAVE SMEAR (SSMR)  LACTIC ACID, PLASMA  URINALYSIS, ROUTINE W REFLEX MICROSCOPIC  TYPE AND SCREEN  PREPARE RBC (CROSSMATCH)    EKG None  Radiology Dg Chest Port 1 View  Result Date: 08/02/2018 CLINICAL DATA:  Fever and hypoxia EXAM: PORTABLE CHEST 1 VIEW COMPARISON:  07/12/2018, 06/18/2018, CT chest 07/05/2018 FINDINGS: Bilateral reticular and ground-glass opacity, increased compared to prior. Normal heart size. No pneumothorax. IMPRESSION: Interval  worsening of bilateral reticular and ground-glass infiltrates since radiograph  07/12/2018. Electronically Signed   By: Donavan Foil M.D.   On: 08/02/2018 00:03    Procedures .Critical Care Performed by: Joanne Gavel, PA-C Authorized by: Joanne Gavel, PA-C   Critical care provider statement:    Critical care time (minutes):  60   Critical care time was exclusive of:  Separately billable procedures and treating other patients and teaching time   Critical care was necessary to treat or prevent imminent or life-threatening deterioration of the following conditions:  Sepsis and circulatory failure   Critical care was time spent personally by me on the following activities:  Re-evaluation of patient's condition, review of old charts, discussions with consultants, obtaining history from patient or surrogate, examination of patient, evaluation of patient's response to treatment, ordering and review of radiographic studies, ordering and review of laboratory studies, ordering and performing treatments and interventions, pulse oximetry and development of treatment plan with patient or surrogate   (including critical care time)  Medications Ordered in ED Medications  vancomycin (VANCOCIN) 1,250 mg in sodium chloride 0.9 % 250 mL IVPB (1,250 mg Intravenous New Bag/Given 08/02/18 0032)  lactated ringers bolus 500 mL (has no administration in time range)    And  lactated ringers bolus 250 mL (has no administration in time range)  0.9 %  sodium chloride infusion (has no administration in time range)  acetaminophen (TYLENOL) tablet 650 mg (650 mg Oral Given 08/01/18 2336)  ceFEPIme (MAXIPIME) 2 g in sodium chloride 0.9 % 100 mL IVPB (0 g Intravenous Stopped 08/02/18 0058)  sodium chloride 0.9 % bolus 1,000 mL (1,000 mLs Intravenous New Bag/Given 08/01/18 2338)     Initial Impression / Assessment and Plan / ED Course  I have reviewed the triage vital signs and the nursing notes.  Pertinent labs & imaging results that were available during my care of the patient were reviewed  by me and considered in my medical decision making (see chart for details).        33 year old female with a history of HIV, sarcoidosis, and hemophagocytosis in the bone marrow presenting with fever, shortness of breath, cough, and generalized weakness over the last few days.  On arrival, the patient appears toxic.  Febrile to 101.2 with heart rate in the 160s and tachypneic with respiration rate in the 30s.  She is hypoxic to 88 to 89% that resolves with 2 L nasal cannula.  Initial map is 79 with blood pressure of 122/64; however, shortly after arrival patient's blood pressure decreased to 90s over 60s with map in the mid 70s. The patient was initially evaluated by Dr. Rogene Houston, attending physician, but later discussed with Dr. Florina Ou at shift change.  Code sepsis was initiated.  Blood cultures x2 have been drawn.  She was started on cefepime and vancomycin for pulmonary etiology given chest x-ray demonstrating interval worsening of bilateral reticular and groundglass infiltrates since 6/5.  COVID-19 test was negative.  She was given a 30 cc/kg bolus with improvement of her blood pressure with map in the 80s history elevated lactate, initially 2.7.   Although patient has a leukocytosis of 15. She has had a 4 g drop in hemoglobin in the last 10 days.  Hemoglobin 6.2, down from 10.1 on 6/15 in addition to platelet count of 22, down from 204.  She denies any source of bleeding including vaginal bleeding, hematemesis, hemoptysis, melena, hematochezia.  Patient has recently been worked up for hemophagocytosis of the  bone marrow versus sarcoidosis.  Consulted heme-onc and spoke with Dr. Lindi Adie regarding the patient's anemia and any contraindications to blood products.  No contraindications to blood products.  Dr. Lindi Adie recommends adding on reticulocyte count and LDH.  Type and screen has been ordered as well as 2 units of packed RBCs.  He is requested to be in boxed and added to the patient's chart in  addition to Dr. Irene Limbo, which has been completed.   Labs are otherwise generally reassuring, including electrolytes.  Consulted the hospitalist team and spoke with Dr. Maudie Mercury who will accept the patient for admission. The patient appears reasonably stabilized for admission considering the current resources, flow, and capabilities available in the ED at this time, and I doubt any other [facility] requiring further screening and/or treatment in the ED prior to admission.  Final Clinical Impressions(s) / ED Diagnoses   Final diagnoses:  Sepsis, due to unspecified organism, unspecified whether acute organ dysfunction present ([place])  Thrombocytopenia ([place])  Anemia, unspecified type    ED Discharge Orders    None       Joanne Gavel, PA-C 08/02/18 0136    M[place]us, Jenny Reichmann, MD 08/02/18 [phone]

## 2018-08-01 NOTE — ED Notes (Signed)
Bed: OY43 Expected date:  Expected time:  Means of arrival:  Comments: Neg. pressure

## 2018-08-01 NOTE — ED Triage Notes (Signed)
Patient is SOB upon exertion and while sitting, tested for COVID and awaiting send-out results. Patient complains of fever, weakness, SOB.

## 2018-08-01 NOTE — Progress Notes (Signed)
A consult was received from an ED physician for cefepime and vancomycin per pharmacy dosing.  The patient's profile has been reviewed for ht/wt/allergies/indication/available labs.   A one time order has been placed for Cefepime 2 Gm and Vancomycin 1250 mg.  Further antibiotics/pharmacy consults should be ordered by admitting physician if indicated.                       Thank you, Dorrene German 08/01/2018  10:44 PM

## 2018-08-02 ENCOUNTER — Encounter (HOSPITAL_COMMUNITY): Payer: Self-pay | Admitting: Internal Medicine

## 2018-08-02 ENCOUNTER — Inpatient Hospital Stay (HOSPITAL_COMMUNITY): Payer: 59

## 2018-08-02 DIAGNOSIS — R509 Fever, unspecified: Secondary | ICD-10-CM | POA: Diagnosis not present

## 2018-08-02 DIAGNOSIS — B20 Human immunodeficiency virus [HIV] disease: Secondary | ICD-10-CM

## 2018-08-02 DIAGNOSIS — E860 Dehydration: Secondary | ICD-10-CM | POA: Diagnosis present

## 2018-08-02 DIAGNOSIS — Z21 Asymptomatic human immunodeficiency virus [HIV] infection status: Secondary | ICD-10-CM | POA: Diagnosis not present

## 2018-08-02 DIAGNOSIS — Z8701 Personal history of pneumonia (recurrent): Secondary | ICD-10-CM

## 2018-08-02 DIAGNOSIS — R000 Tachycardia, unspecified: Secondary | ICD-10-CM | POA: Diagnosis not present

## 2018-08-02 DIAGNOSIS — A4189 Other specified sepsis: Secondary | ICD-10-CM | POA: Diagnosis present

## 2018-08-02 DIAGNOSIS — D72829 Elevated white blood cell count, unspecified: Secondary | ICD-10-CM | POA: Diagnosis not present

## 2018-08-02 DIAGNOSIS — D696 Thrombocytopenia, unspecified: Secondary | ICD-10-CM

## 2018-08-02 DIAGNOSIS — Y95 Nosocomial condition: Secondary | ICD-10-CM | POA: Diagnosis present

## 2018-08-02 DIAGNOSIS — E222 Syndrome of inappropriate secretion of antidiuretic hormone: Secondary | ICD-10-CM | POA: Diagnosis present

## 2018-08-02 DIAGNOSIS — J9601 Acute respiratory failure with hypoxia: Secondary | ICD-10-CM | POA: Diagnosis not present

## 2018-08-02 DIAGNOSIS — Z6823 Body mass index (BMI) 23.0-23.9, adult: Secondary | ICD-10-CM | POA: Diagnosis not present

## 2018-08-02 DIAGNOSIS — R093 Abnormal sputum: Secondary | ICD-10-CM | POA: Diagnosis not present

## 2018-08-02 DIAGNOSIS — R071 Chest pain on breathing: Secondary | ICD-10-CM | POA: Diagnosis not present

## 2018-08-02 DIAGNOSIS — R079 Chest pain, unspecified: Secondary | ICD-10-CM | POA: Diagnosis not present

## 2018-08-02 DIAGNOSIS — Z9889 Other specified postprocedural states: Secondary | ICD-10-CM | POA: Diagnosis not present

## 2018-08-02 DIAGNOSIS — Z79899 Other long term (current) drug therapy: Secondary | ICD-10-CM | POA: Diagnosis not present

## 2018-08-02 DIAGNOSIS — R739 Hyperglycemia, unspecified: Secondary | ICD-10-CM | POA: Diagnosis present

## 2018-08-02 DIAGNOSIS — J984 Other disorders of lung: Secondary | ICD-10-CM | POA: Diagnosis not present

## 2018-08-02 DIAGNOSIS — J189 Pneumonia, unspecified organism: Secondary | ICD-10-CM | POA: Insufficient documentation

## 2018-08-02 DIAGNOSIS — R5081 Fever presenting with conditions classified elsewhere: Secondary | ICD-10-CM | POA: Diagnosis not present

## 2018-08-02 DIAGNOSIS — Z87891 Personal history of nicotine dependence: Secondary | ICD-10-CM

## 2018-08-02 DIAGNOSIS — M549 Dorsalgia, unspecified: Secondary | ICD-10-CM | POA: Diagnosis not present

## 2018-08-02 DIAGNOSIS — D649 Anemia, unspecified: Secondary | ICD-10-CM

## 2018-08-02 DIAGNOSIS — D508 Other iron deficiency anemias: Secondary | ICD-10-CM | POA: Diagnosis not present

## 2018-08-02 DIAGNOSIS — I499 Cardiac arrhythmia, unspecified: Secondary | ICD-10-CM | POA: Diagnosis not present

## 2018-08-02 DIAGNOSIS — R87613 High grade squamous intraepithelial lesion on cytologic smear of cervix (HGSIL): Secondary | ICD-10-CM | POA: Diagnosis present

## 2018-08-02 DIAGNOSIS — E871 Hypo-osmolality and hyponatremia: Secondary | ICD-10-CM | POA: Diagnosis not present

## 2018-08-02 DIAGNOSIS — B9729 Other coronavirus as the cause of diseases classified elsewhere: Secondary | ICD-10-CM | POA: Diagnosis not present

## 2018-08-02 DIAGNOSIS — B081 Molluscum contagiosum: Secondary | ICD-10-CM | POA: Diagnosis not present

## 2018-08-02 DIAGNOSIS — E872 Acidosis: Secondary | ICD-10-CM | POA: Diagnosis present

## 2018-08-02 DIAGNOSIS — R6520 Severe sepsis without septic shock: Secondary | ICD-10-CM | POA: Diagnosis present

## 2018-08-02 DIAGNOSIS — Z20828 Contact with and (suspected) exposure to other viral communicable diseases: Secondary | ICD-10-CM | POA: Diagnosis present

## 2018-08-02 DIAGNOSIS — R1030 Lower abdominal pain, unspecified: Secondary | ICD-10-CM | POA: Diagnosis not present

## 2018-08-02 DIAGNOSIS — I313 Pericardial effusion (noninflammatory): Secondary | ICD-10-CM | POA: Diagnosis present

## 2018-08-02 DIAGNOSIS — D759 Disease of blood and blood-forming organs, unspecified: Secondary | ICD-10-CM | POA: Diagnosis not present

## 2018-08-02 DIAGNOSIS — R0602 Shortness of breath: Secondary | ICD-10-CM | POA: Diagnosis not present

## 2018-08-02 DIAGNOSIS — E440 Moderate protein-calorie malnutrition: Secondary | ICD-10-CM | POA: Diagnosis present

## 2018-08-02 DIAGNOSIS — B59 Pneumocystosis: Secondary | ICD-10-CM | POA: Diagnosis not present

## 2018-08-02 DIAGNOSIS — M545 Low back pain: Secondary | ICD-10-CM | POA: Diagnosis not present

## 2018-08-02 DIAGNOSIS — Z8709 Personal history of other diseases of the respiratory system: Secondary | ICD-10-CM | POA: Diagnosis not present

## 2018-08-02 DIAGNOSIS — R110 Nausea: Secondary | ICD-10-CM | POA: Diagnosis not present

## 2018-08-02 DIAGNOSIS — Z825 Family history of asthma and other chronic lower respiratory diseases: Secondary | ICD-10-CM | POA: Diagnosis not present

## 2018-08-02 DIAGNOSIS — R945 Abnormal results of liver function studies: Secondary | ICD-10-CM | POA: Diagnosis not present

## 2018-08-02 DIAGNOSIS — A419 Sepsis, unspecified organism: Secondary | ICD-10-CM | POA: Diagnosis present

## 2018-08-02 DIAGNOSIS — D640 Hereditary sideroblastic anemia: Secondary | ICD-10-CM | POA: Diagnosis not present

## 2018-08-02 DIAGNOSIS — R531 Weakness: Secondary | ICD-10-CM | POA: Diagnosis not present

## 2018-08-02 DIAGNOSIS — J1289 Other viral pneumonia: Secondary | ICD-10-CM | POA: Diagnosis not present

## 2018-08-02 DIAGNOSIS — F329 Major depressive disorder, single episode, unspecified: Secondary | ICD-10-CM | POA: Diagnosis not present

## 2018-08-02 DIAGNOSIS — Z888 Allergy status to other drugs, medicaments and biological substances status: Secondary | ICD-10-CM

## 2018-08-02 DIAGNOSIS — Z7952 Long term (current) use of systemic steroids: Secondary | ICD-10-CM | POA: Diagnosis not present

## 2018-08-02 DIAGNOSIS — R0603 Acute respiratory distress: Secondary | ICD-10-CM

## 2018-08-02 DIAGNOSIS — R918 Other nonspecific abnormal finding of lung field: Secondary | ICD-10-CM | POA: Diagnosis not present

## 2018-08-02 DIAGNOSIS — R17 Unspecified jaundice: Secondary | ICD-10-CM | POA: Diagnosis not present

## 2018-08-02 DIAGNOSIS — D539 Nutritional anemia, unspecified: Secondary | ICD-10-CM | POA: Diagnosis present

## 2018-08-02 DIAGNOSIS — N179 Acute kidney failure, unspecified: Secondary | ICD-10-CM | POA: Diagnosis not present

## 2018-08-02 DIAGNOSIS — R05 Cough: Secondary | ICD-10-CM | POA: Diagnosis not present

## 2018-08-02 LAB — IRON AND TIBC
Iron: 19 ug/dL — ABNORMAL LOW (ref 28–170)
Saturation Ratios: 11 % (ref 10.4–31.8)
TIBC: 173 ug/dL — ABNORMAL LOW (ref 250–450)
UIBC: 154 ug/dL

## 2018-08-02 LAB — LACTATE DEHYDROGENASE
LDH: 343 U/L — ABNORMAL HIGH (ref 98–192)
LDH: 487 U/L — ABNORMAL HIGH (ref 98–192)

## 2018-08-02 LAB — CBC WITH DIFFERENTIAL/PLATELET
Abs Immature Granulocytes: 2.35 10*3/uL — ABNORMAL HIGH (ref 0.00–0.07)
Basophils Absolute: 0 10*3/uL (ref 0.0–0.1)
Basophils Relative: 0 %
Eosinophils Absolute: 0 10*3/uL (ref 0.0–0.5)
Eosinophils Relative: 0 %
HCT: 19.5 % — ABNORMAL LOW (ref 36.0–46.0)
Hemoglobin: 6.2 g/dL — CL (ref 12.0–15.0)
Immature Granulocytes: 15 %
Lymphocytes Relative: 10 %
Lymphs Abs: 1.6 10*3/uL (ref 0.7–4.0)
MCH: 33.2 pg (ref 26.0–34.0)
MCHC: 31.8 g/dL (ref 30.0–36.0)
MCV: 104.3 fL — ABNORMAL HIGH (ref 80.0–100.0)
Monocytes Absolute: 0.3 10*3/uL (ref 0.1–1.0)
Monocytes Relative: 2 %
Neutro Abs: 11.8 10*3/uL — ABNORMAL HIGH (ref 1.7–7.7)
Neutrophils Relative %: 73 %
Platelets: 22 10*3/uL — CL (ref 150–400)
RBC: 1.87 MIL/uL — ABNORMAL LOW (ref 3.87–5.11)
RDW: 20.2 % — ABNORMAL HIGH (ref 11.5–15.5)
WBC: 16 10*3/uL — ABNORMAL HIGH (ref 4.0–10.5)
nRBC: 0.6 % — ABNORMAL HIGH (ref 0.0–0.2)

## 2018-08-02 LAB — RAPID URINE DRUG SCREEN, HOSP PERFORMED
Amphetamines: NOT DETECTED
Barbiturates: NOT DETECTED
Benzodiazepines: NOT DETECTED
Cocaine: NOT DETECTED
Opiates: POSITIVE — AB
Tetrahydrocannabinol: NOT DETECTED

## 2018-08-02 LAB — COMPREHENSIVE METABOLIC PANEL
ALT: 21 U/L (ref 0–44)
ALT: 18 U/L (ref 0–44)
AST: 44 U/L — ABNORMAL HIGH (ref 15–41)
AST: 34 U/L (ref 15–41)
Albumin: 2 g/dL — ABNORMAL LOW (ref 3.5–5.0)
Albumin: 1.9 g/dL — ABNORMAL LOW (ref 3.5–5.0)
Alkaline Phosphatase: 122 U/L (ref 38–126)
Alkaline Phosphatase: 104 U/L (ref 38–126)
Anion gap: 13 (ref 5–15)
Anion gap: 12 (ref 5–15)
BUN: 25 mg/dL — ABNORMAL HIGH (ref 6–20)
BUN: 17 mg/dL (ref 6–20)
CO2: 20 mmol/L — ABNORMAL LOW (ref 22–32)
CO2: 15 mmol/L — ABNORMAL LOW (ref 22–32)
Calcium: 7.5 mg/dL — ABNORMAL LOW (ref 8.9–10.3)
Calcium: 6.9 mg/dL — ABNORMAL LOW (ref 8.9–10.3)
Chloride: 93 mmol/L — ABNORMAL LOW (ref 98–111)
Chloride: 104 mmol/L (ref 98–111)
Creatinine, Ser: 1.23 mg/dL — ABNORMAL HIGH (ref 0.44–1.00)
Creatinine, Ser: 0.98 mg/dL (ref 0.44–1.00)
GFR calc Af Amer: >60 mL/min (ref >60)
GFR calc Af Amer: >60 mL/min (ref >60)
GFR calc non Af Amer: 58 mL/min — ABNORMAL LOW (ref >60)
GFR calc non Af Amer: >60 mL/min (ref >60)
Glucose, Bld: 107 mg/dL — ABNORMAL HIGH (ref 70–99)
Glucose, Bld: 245 mg/dL — ABNORMAL HIGH (ref 70–99)
Potassium: 4.7 mmol/L (ref 3.5–5.1)
Potassium: 4.7 mmol/L (ref 3.5–5.1)
Sodium: 126 mmol/L — ABNORMAL LOW (ref 135–145)
Sodium: 131 mmol/L — ABNORMAL LOW (ref 135–145)
Total Bilirubin: 1.9 mg/dL — ABNORMAL HIGH (ref 0.3–1.2)
Total Bilirubin: 1.1 mg/dL (ref 0.3–1.2)
Total Protein: 6.9 g/dL (ref 6.5–8.1)
Total Protein: 6.3 g/dL — ABNORMAL LOW (ref 6.5–8.1)

## 2018-08-02 LAB — CBC
HCT: 29 % — ABNORMAL LOW (ref 36.0–46.0)
Hemoglobin: 9.3 g/dL — ABNORMAL LOW (ref 12.0–15.0)
MCH: 30.7 pg (ref 26.0–34.0)
MCHC: 32.1 g/dL (ref 30.0–36.0)
MCV: 95.7 fL (ref 80.0–100.0)
Platelets: 17 10*3/uL — CL (ref 150–400)
RBC: 3.03 MIL/uL — ABNORMAL LOW (ref 3.87–5.11)
RDW: 22.3 % — ABNORMAL HIGH (ref 11.5–15.5)
WBC: 18 10*3/uL — ABNORMAL HIGH (ref 4.0–10.5)
nRBC: 0.4 % — ABNORMAL HIGH (ref 0.0–0.2)

## 2018-08-02 LAB — LACTIC ACID, PLASMA
Lactic Acid, Venous: 2.7 mmol/L (ref 0.5–1.9)
Lactic Acid, Venous: 4.6 mmol/L (ref 0.5–1.9)
Lactic Acid, Venous: 6.1 mmol/L (ref 0.5–1.9)
Lactic Acid, Venous: 5.9 mmol/L (ref 0.5–1.9)
Lactic Acid, Venous: 3.2 mmol/L (ref 0.5–1.9)

## 2018-08-02 LAB — SEDIMENTATION RATE: Sed Rate: 140 mm/hr — ABNORMAL HIGH (ref 0–22)

## 2018-08-02 LAB — FERRITIN: Ferritin: 5223 ng/mL — ABNORMAL HIGH (ref 11–307)

## 2018-08-02 LAB — RETICULOCYTES
Immature Retic Fract: 11.1 % (ref 2.3–15.9)
RBC.: 1.86 MIL/uL — ABNORMAL LOW (ref 3.87–5.11)
Retic Count, Absolute: 22.5 10*3/uL (ref 19.0–186.0)
Retic Ct Pct: 1.2 % (ref 0.4–3.1)

## 2018-08-02 LAB — SAVE SMEAR(SSMR), FOR PROVIDER SLIDE REVIEW

## 2018-08-02 LAB — PLATELET COUNT: Platelets: 28 10*3/uL — CL (ref 150–400)

## 2018-08-02 LAB — HEMOGLOBIN A1C
Hgb A1c MFr Bld: 4.7 % — ABNORMAL LOW (ref 4.8–5.6)
Mean Plasma Glucose: 88.19 mg/dL

## 2018-08-02 LAB — INFLUENZA PANEL BY PCR (TYPE A & B)
Influenza A By PCR: NEGATIVE
Influenza B By PCR: NEGATIVE

## 2018-08-02 LAB — ECHOCARDIOGRAM COMPLETE
Height: 61 in
Weight: 2000 oz

## 2018-08-02 LAB — MRSA PCR SCREENING: MRSA by PCR: NEGATIVE

## 2018-08-02 LAB — PREPARE RBC (CROSSMATCH)

## 2018-08-02 LAB — BRAIN NATRIURETIC PEPTIDE: B Natriuretic Peptide: 144 pg/mL — ABNORMAL HIGH (ref 0.0–100.0)

## 2018-08-02 LAB — PREGNANCY, URINE: Preg Test, Ur: NEGATIVE

## 2018-08-02 LAB — GLUCOSE, CAPILLARY: Glucose-Capillary: 111 mg/dL — ABNORMAL HIGH (ref 70–99)

## 2018-08-02 LAB — TSH: TSH: 0.877 u[IU]/mL (ref 0.350–4.500)

## 2018-08-02 LAB — TRIGLYCERIDES: Triglycerides: 229 mg/dL — ABNORMAL HIGH (ref <150)

## 2018-08-02 LAB — IMMATURE PLATELET FRACTION: Immature Platelet Fraction: 13.1 % — ABNORMAL HIGH (ref 1.2–8.6)

## 2018-08-02 LAB — TROPONIN I (HIGH SENSITIVITY)
Troponin I (High Sensitivity): 10 ng/L (ref <18)
Troponin I (High Sensitivity): 10 ng/L (ref <18)

## 2018-08-02 LAB — FIBRINOGEN: Fibrinogen: >800 mg/dL — ABNORMAL HIGH (ref 210–475)

## 2018-08-02 LAB — SARS CORONAVIRUS 2 BY RT PCR (HOSPITAL ORDER, PERFORMED IN ~~LOC~~ HOSPITAL LAB): SARS Coronavirus 2: NEGATIVE

## 2018-08-02 LAB — VITAMIN B12: Vitamin B-12: 1073 pg/mL — ABNORMAL HIGH (ref 180–914)

## 2018-08-02 MED ORDER — IOHEXOL 350 MG/ML SOLN
100.0000 mL | Freq: Once | INTRAVENOUS | Status: AC | PRN
  Administered 2018-08-02: 100 mL via INTRAVENOUS

## 2018-08-02 MED ORDER — MORPHINE SULFATE (PF) 2 MG/ML IV SOLN
2.0000 mg | INTRAVENOUS | Status: DC | PRN
  Administered 2018-08-02 – 2018-08-07 (×7): 2 mg via INTRAVENOUS
  Filled 2018-08-02 (×8): qty 1

## 2018-08-02 MED ORDER — FOLIC ACID 1 MG PO TABS
2.0000 mg | ORAL_TABLET | Freq: Every day | ORAL | Status: DC
  Administered 2018-08-02 – 2018-08-22 (×21): 2 mg via ORAL
  Filled 2018-08-02 (×21): qty 2

## 2018-08-02 MED ORDER — SODIUM CHLORIDE 0.9 % IV BOLUS
1000.0000 mL | Freq: Once | INTRAVENOUS | Status: AC
  Administered 2018-08-02: 1000 mL via INTRAVENOUS

## 2018-08-02 MED ORDER — ORAL CARE MOUTH RINSE
15.0000 mL | Freq: Two times a day (BID) | OROMUCOSAL | Status: DC
  Administered 2018-08-02 – 2018-08-21 (×31): 15 mL via OROMUCOSAL

## 2018-08-02 MED ORDER — SODIUM CHLORIDE 0.9% IV SOLUTION
Freq: Once | INTRAVENOUS | Status: AC
  Administered 2018-08-03: 10:00:00 via INTRAVENOUS

## 2018-08-02 MED ORDER — SODIUM CHLORIDE 3 % IN NEBU
4.0000 mL | INHALATION_SOLUTION | Freq: Every day | RESPIRATORY_TRACT | Status: DC
  Filled 2018-08-02: qty 4

## 2018-08-02 MED ORDER — AZITHROMYCIN 250 MG PO TABS
250.0000 mg | ORAL_TABLET | Freq: Every day | ORAL | Status: AC
  Administered 2018-08-02 – 2018-08-03 (×2): 250 mg via ORAL
  Filled 2018-08-02 (×2): qty 1

## 2018-08-02 MED ORDER — SODIUM CHLORIDE 0.9 % IV SOLN
INTRAVENOUS | Status: AC
  Administered 2018-08-02: 14:00:00 via INTRAVENOUS

## 2018-08-02 MED ORDER — SODIUM CHLORIDE 0.9 % IV SOLN
INTRAVENOUS | Status: DC
  Administered 2018-08-02: 06:00:00 via INTRAVENOUS

## 2018-08-02 MED ORDER — PRIMAQUINE PHOSPHATE 26.3 MG PO TABS
30.0000 mg | ORAL_TABLET | Freq: Every day | ORAL | Status: DC
  Administered 2018-08-02 – 2018-08-22 (×21): 30 mg via ORAL
  Filled 2018-08-02 (×3): qty 2
  Filled 2018-08-02: qty 1
  Filled 2018-08-02 (×19): qty 2

## 2018-08-02 MED ORDER — VANCOMYCIN HCL IN DEXTROSE 1-5 GM/200ML-% IV SOLN: 1000.0000 mg | Freq: Every day | INTRAVENOUS | Status: DC

## 2018-08-02 MED ORDER — B COMPLEX-C PO TABS
1.0000 | ORAL_TABLET | Freq: Every day | ORAL | Status: DC
  Administered 2018-08-02 – 2018-08-22 (×21): 1 via ORAL
  Filled 2018-08-02 (×22): qty 1

## 2018-08-02 MED ORDER — ONDANSETRON HCL 4 MG/2ML IJ SOLN: 4.0000 mg | Freq: Four times a day (QID) | INTRAMUSCULAR | Status: DC | PRN

## 2018-08-02 MED ORDER — INSULIN ASPART 100 UNIT/ML ~~LOC~~ SOLN
0.0000 [IU] | Freq: Three times a day (TID) | SUBCUTANEOUS | Status: DC
  Administered 2018-08-03: 2 [IU] via SUBCUTANEOUS
  Administered 2018-08-03: 3 [IU] via SUBCUTANEOUS
  Administered 2018-08-03: 2 [IU] via SUBCUTANEOUS
  Administered 2018-08-05 (×2): 3 [IU] via SUBCUTANEOUS
  Administered 2018-08-05 – 2018-08-07 (×5): 2 [IU] via SUBCUTANEOUS
  Administered 2018-08-07: 3 [IU] via SUBCUTANEOUS
  Administered 2018-08-07 – 2018-08-08 (×3): 2 [IU] via SUBCUTANEOUS
  Administered 2018-08-08: 5 [IU] via SUBCUTANEOUS
  Administered 2018-08-09: 3 [IU] via SUBCUTANEOUS
  Administered 2018-08-09: 5 [IU] via SUBCUTANEOUS
  Administered 2018-08-09 – 2018-08-10 (×2): 3 [IU] via SUBCUTANEOUS
  Administered 2018-08-10: 2 [IU] via SUBCUTANEOUS
  Administered 2018-08-10: 3 [IU] via SUBCUTANEOUS
  Administered 2018-08-11: 5 [IU] via SUBCUTANEOUS
  Administered 2018-08-11: 3 [IU] via SUBCUTANEOUS
  Administered 2018-08-11: 2 [IU] via SUBCUTANEOUS
  Administered 2018-08-12: 3 [IU] via SUBCUTANEOUS
  Administered 2018-08-12: 5 [IU] via SUBCUTANEOUS

## 2018-08-02 MED ORDER — MORPHINE SULFATE (PF) 2 MG/ML IV SOLN
1.0000 mg | Freq: Once | INTRAVENOUS | Status: DC
  Filled 2018-08-02: qty 1

## 2018-08-02 MED ORDER — BICTEGRAVIR-EMTRICITAB-TENOFOV 50-200-25 MG PO TABS
1.0000 | ORAL_TABLET | Freq: Every day | ORAL | Status: DC
  Administered 2018-08-02 – 2018-08-22 (×21): 1 via ORAL
  Filled 2018-08-02 (×22): qty 1

## 2018-08-02 MED ORDER — CLINDAMYCIN PHOSPHATE 900 MG/50ML IV SOLN
900.0000 mg | Freq: Three times a day (TID) | INTRAVENOUS | Status: DC
  Administered 2018-08-02 – 2018-08-22 (×62): 900 mg via INTRAVENOUS
  Filled 2018-08-02 (×66): qty 50

## 2018-08-02 MED ORDER — SODIUM CHLORIDE 0.9 % IV SOLN
2.0000 g | Freq: Two times a day (BID) | INTRAVENOUS | Status: DC
  Administered 2018-08-02 – 2018-08-03 (×4): 2 g via INTRAVENOUS
  Filled 2018-08-02 (×5): qty 2

## 2018-08-02 MED ORDER — SODIUM CHLORIDE 3 % IN NEBU
4.0000 mL | INHALATION_SOLUTION | Freq: Once | RESPIRATORY_TRACT | Status: AC
  Administered 2018-08-02: 4 mL via RESPIRATORY_TRACT
  Filled 2018-08-02: qty 4

## 2018-08-02 MED ORDER — PREDNISONE 20 MG PO TABS: 40.0000 mg | ORAL_TABLET | Freq: Every day | ORAL | Status: DC

## 2018-08-02 MED ORDER — PREDNISONE 20 MG PO TABS
40.0000 mg | ORAL_TABLET | Freq: Every day | ORAL | Status: DC
  Administered 2018-08-02: 40 mg via ORAL
  Filled 2018-08-02: qty 2

## 2018-08-02 MED ORDER — BENZONATATE 100 MG PO CAPS
100.0000 mg | ORAL_CAPSULE | Freq: Two times a day (BID) | ORAL | Status: DC | PRN
  Administered 2018-08-02: 100 mg via ORAL
  Administered 2018-08-03: 200 mg via ORAL
  Filled 2018-08-02: qty 2
  Filled 2018-08-02 (×2): qty 1

## 2018-08-02 MED ORDER — GUAIFENESIN ER 600 MG PO TB12
600.0000 mg | ORAL_TABLET | Freq: Two times a day (BID) | ORAL | Status: DC
  Administered 2018-08-02 – 2018-08-04 (×4): 600 mg via ORAL
  Filled 2018-08-02 (×4): qty 1

## 2018-08-02 MED ORDER — METHYLPREDNISOLONE SODIUM SUCC 40 MG IJ SOLR
40.0000 mg | Freq: Two times a day (BID) | INTRAMUSCULAR | Status: DC
  Administered 2018-08-02 – 2018-08-13 (×22): 40 mg via INTRAVENOUS
  Filled 2018-08-02 (×22): qty 1

## 2018-08-02 MED ORDER — PREDNISONE 10 MG PO TABS: 10.0000 mg | ORAL_TABLET | Freq: Every day | ORAL | Status: DC

## 2018-08-02 MED ORDER — SODIUM CHLORIDE 0.9 % IV SOLN: 10.0000 mL/h | Freq: Once | INTRAVENOUS | Status: AC

## 2018-08-02 MED ORDER — SODIUM CHLORIDE (PF) 0.9 % IJ SOLN
INTRAMUSCULAR | Status: AC
  Filled 2018-08-02: qty 50

## 2018-08-02 MED ORDER — PREDNISONE 20 MG PO TABS: 20.0000 mg | ORAL_TABLET | Freq: Every day | ORAL | Status: DC

## 2018-08-02 NOTE — ED Notes (Signed)
ED TO INPATIENT HANDOFF REPORT  Name/Age/Gender Krystal Short 33 y.o. female  Code Status Code Status History    Date Active Date Inactive Code Status Order ID Comments User Context   07/04/2018 1709 07/16/2018 1809 Full Code [id number]  Krystal Aloe, MD Inpatient   05/28/2018 1841 05/31/2018 1915 Full Code [id number]  Krystal Public, MD Inpatient   03/08/2018 2046 03/10/2018 1736 Full Code [id number]  Krystal Patience, MD ED   06/03/2017 1741 06/11/2017 2216 Full Code [id number]  Krystal Parr, NP ED   Advance Care Planning Activity      Home/SNF/Other Home  Chief Complaint Fever, Cough, Sob, Fatigue  Level of Care/Admitting Diagnosis ED Disposition    ED Disposition Condition [facility] Area: [place] [[id number]]  Level of Care: Stepdown [14]  Admit to SDU based on following criteria: Hemodynamic compromise or significant risk of instability:  Patient requiring short term acute titration and management of vasoactive drips, and invasive monitoring (i.e., CVP and Arterial line).  Covid Evaluation: Screening Protocol (No Symptoms)  Diagnosis: Fever [119417]  Admitting Physician: [name] [[id number]]  Attending Physician: [name] [phone]  Estimated length of stay: past midnight tomorrow  Certification:: I certify this patient will need inpatient services for at least 2 midnights  PT Class (Do Not Modify): Inpatient [101]  PT Acc Code (Do Not Modify): Private [1]       Medical History Past Medical History:  Diagnosis Date  . Acute hyponatremia 06/03/2017  . Anemia   . CAP (community acquired pneumonia) 06/03/2017  . Community acquired pneumonia 06/05/2017  . Facial dermatitis 06/03/2017  . HIV (human immunodeficiency virus infection) ([place])   . Pleurisy 06/03/2017  . Pneumonia 06/06/2017  . 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 Thrush of mouth and esophagus ([place])   . UTI (urinary tract infection)      Allergies Allergies  Allergen Reactions  . Heparin     IV Location/Drains/Wounds Patient Lines/Drains/Airways Status   Active Line/Drains/Airways    Name:   Placement date:   Placement time:   Site:   Days:   Peripheral IV 08/01/18 Left Wrist   08/01/18    2338    Wrist   1          Labs/Imaging Results for orders placed or performed during the hospital encounter of 08/01/18 (from the past 48 hour(s))  Lactic acid, plasma     Status: Abnormal   Collection Time: 08/01/18 10:27 PM  Result Value Ref Range   Lactic Acid, Venous 2.7 (HH) 0.5 - 1.9 mmol/L    Comment: CRITICAL RESULT CALLED TO, READ BACK BY AND VERIFIED WITH: Verita Lamb DOSTER AT 4481 08/02/18 [place] Performed at [facility], [place] [address], [place], [place] [address]   Comprehensive metabolic panel     Status: Abnormal   Collection Time: 08/01/18 10:27 PM  Result Value Ref Range   Sodium 126 (L) 135 - 145 mmol/L   Potassium 4.7 3.5 - 5.1 mmol/L   Chloride 93 (L) 98 - 111 mmol/L   CO2 20 (L) 22 - 32 mmol/L   Glucose, Bld 107 (H) 70 - 99 mg/dL   BUN 25 (H) 6 - 20 mg/dL   Creatinine, Ser 1.23 (H) 0.44 - 1.00 mg/dL   Calcium 7.5 (L) 8.9 - 10.3 mg/dL   Total Protein 6.9 6.5 - 8.1 g/dL   Albumin 2.0 (L) 3.5 - 5.0 g/dL  AST 44 (H) 15 - 41 U/L   ALT 21 0 - 44 U/L   Alkaline Phosphatase 122 38 - 126 U/L   Total Bilirubin 1.9 (H) 0.3 - 1.2 mg/dL   GFR calc non Af Amer 58 (L) >60 mL/min   GFR calc Af Amer >60 >60 mL/min   Anion gap 13 5 - 15    Comment: Performed at [facility], [place] [address] [place], [place] [address]  CBC WITH DIFFERENTIAL     Status: Abnormal   Collection Time: 08/01/18 10:27 PM  Result Value Ref Range   WBC 16.0 (H) 4.0 - 10.5 K/uL    Comment: WHITE COUNT CONFIRMED ON SMEAR   RBC 1.87 (L) 3.87 - 5.11 MIL/uL   Hemoglobin 6.2 (LL) 12.0 - 15.0 g/dL    Comment: REPEATED TO VERIFY THIS CRITICAL RESULT HAS VERIFIED AND BEEN CALLED TO C [name]  RN BY AMELIA NAVARRO ON 06 26 2020 AT 0018, AND HAS BEEN READ BACK. CRITICAL RESULT VERIFIED    HCT 19.5 (L) 36.0 - 46.0 %   MCV 104.3 (H) 80.0 - 100.0 fL   MCH 33.2 26.0 - 34.0 pg   MCHC 31.8 30.0 - 36.0 g/dL   RDW 20.2 (H) 11.5 - 15.5 %   Platelets 22 (LL) 150 - 400 K/uL    Comment: REPEATED TO VERIFY PLATELET COUNT CONFIRMED BY SMEAR SPECIMEN CHECKED FOR CLOTS Immature Platelet Fraction may be clinically indicated, consider ordering this additional test [id number]    nRBC 0.6 (H) 0.0 - 0.2 %   Neutrophils Relative % 73 %   Neutro Abs 11.8 (H) 1.7 - 7.7 K/uL   Lymphocytes Relative 10 %   Lymphs Abs 1.6 0.7 - 4.0 K/uL   Monocytes Relative 2 %   Monocytes Absolute 0.3 0.1 - 1.0 K/uL   Eosinophils Relative 0 %   Eosinophils Absolute 0.0 0.0 - 0.5 K/uL   Basophils Relative 0 %   Basophils Absolute 0.0 0.0 - 0.1 K/uL   WBC Morphology MILD LEFT SHIFT (1-5% METAS, OCC MYELO, OCC BANDS)    Immature Granulocytes 15 %   Abs Immature Granulocytes 2.35 (H) 0.00 - 0.07 K/uL    Comment: Performed at [facility], [place] [address] [place], [place] [address]  SARS Coronavirus 2 (CEPHEID- Performed in [place] hospital lab), Hosp Order     Status: None   Collection Time: 08/01/18 10:28 PM   Specimen: Nasopharyngeal Swab  Result Value Ref Range   SARS Coronavirus 2 NEGATIVE NEGATIVE    Comment: (NOTE) If result is NEGATIVE SARS-CoV-2 target nucleic acids are NOT DETECTED. The SARS-CoV-2 RNA is generally detectable in upper and lower  respiratory specimens during the acute phase of infection. The lowest  concentration of SARS-CoV-2 viral copies this assay can detect is 250  copies / mL. A negative result does not preclude SARS-CoV-2 infection  and should not be used as the sole basis for treatment or other  patient management decisions.  A negative result may occur with  improper specimen collection / handling, submission of specimen other  than nasopharyngeal swab,  presence of viral mutation(s) within the  areas targeted by this assay, and inadequate number of viral copies  (<250 copies / mL). A negative result must be combined with clinical  observations, patient history, and epidemiological information. If result is POSITIVE SARS-CoV-2 target nucleic acids are DETECTED. The SARS-CoV-2 RNA is generally detectable in upper and lower  respiratory specimens dur ing the acute  phase of infection.  Positive  results are indicative of active infection with SARS-CoV-2.  Clinical  correlation with patient history and other diagnostic information is  necessary to determine patient infection status.  Positive results do  not rule out bacterial infection or co-infection with other viruses. If result is PRESUMPTIVE POSTIVE SARS-CoV-2 nucleic acids MAY BE PRESENT.   A presumptive positive result was obtained on the submitted specimen  and confirmed on repeat testing.  While 2019 novel coronavirus  (SARS-CoV-2) nucleic acids may be present in the submitted sample  additional confirmatory testing may be necessary for epidemiological  and / or clinical management purposes  to differentiate between  SARS-CoV-2 and other Sarbecovirus currently known to infect humans.  If clinically indicated additional testing with an alternate test  methodology [phone]) is advised. The SARS-CoV-2 RNA is generally  detectable in upper and lower respiratory sp ecimens during the acute  phase of infection. The expected result is Negative. Fact Sheet for Patients:  StrictlyIdeas.no Fact Sheet for Healthcare Providers: BankingDealers.co.za This test is not yet approved or cleared by the [place] FDA and has been authorized for detection and/or diagnosis of SARS-CoV-2 by FDA under an Emergency Use Authorization (EUA).  This EUA will remain in effect (meaning this test can be used) for the duration of the COVID-19 declaration under  Section 564(b)(1) of the Act, 21 U.S.C. section 360bbb-3(b)(1), unless the authorization is terminated or revoked sooner. Performed at [facility], [place] [address], [place], [place] [address]   Lactate dehydrogenase     Status: Abnormal   Collection Time: 08/02/18 12:43 AM  Result Value Ref Range   LDH 487 (H) 98 - 192 U/L    Comment: Performed at [facility], [place] [address], [place], [place] [address]  Reticulocytes     Status: Abnormal   Collection Time: 08/02/18 12:43 AM  Result Value Ref Range   Retic Ct Pct 1.2 0.4 - 3.1 %   RBC. 1.86 (L) 3.87 - 5.11 MIL/uL   Retic Count, Absolute 22.5 19.0 - 186.0 K/uL   Immature Retic Fract 11.1 2.3 - 15.9 %    Comment: Performed at [facility], [place] [address], [place], [place] [address]   Dg Chest Port 1 View  Result Date: 08/02/2018 CLINICAL DATA:  Fever and hypoxia EXAM: PORTABLE CHEST 1 VIEW COMPARISON:  07/12/2018, 06/18/2018, CT chest 07/05/2018 FINDINGS: Bilateral reticular and ground-glass opacity, increased compared to prior. Normal heart size. No pneumothorax. IMPRESSION: Interval worsening of bilateral reticular and ground-glass infiltrates since radiograph 07/12/2018. Electronically Signed   By: Donavan Foil M.D.   On: 08/02/2018 00:03    Pending Labs Unresulted Labs (From admission, onward)    Start     Ordered   08/02/18 0049  Prepare RBC  (Adult Blood Administration - PRBC)  Once,   R    Question Answer Comment  # of Units 2 units   Transfusion Indications Other (Specify)   If emergent release call blood bank [facility] [phone]   Date/Time blood product needed patient is being worked up for hemophagocytosis of bone marrow      08/02/18 0051   08/02/18 0046  Type and screen [place]  Once,   STAT    Comments: [place]    08/02/18 0045   08/02/18 0043  Save Smear (SSMR)  Once,   R     08/02/18 0043   08/01/18 2227   Lactic acid, plasma  Now then every 2 hours,   STAT     08/01/18 2229   08/01/18 2227  Blood Culture (routine x 2)  BLOOD CULTURE X 2,   STAT    Question:  Patient immune status  Answer:  Immunocompromised   08/01/18 2229   08/01/18 2227  Urinalysis, Routine w reflex microscopic  ONCE - STAT,   STAT     08/01/18 2229          Vitals/Pain Today's Vitals   08/02/18 0030 08/02/18 0100 08/02/18 0102 08/02/18 0103  BP: 108/69 99/66    Pulse: (!) 132 (!) 127    Resp: (!) 24 14    Temp:   99.9 F (37.7 C)   TempSrc:   Oral   SpO2: 97% 96%    Weight:    56.7 kg  Height:    5\' 1"  (1.549 m)  PainSc:        Isolation Precautions Airborne and Contact precautions  Medications Medications  vancomycin (VANCOCIN) 1,250 mg in sodium chloride 0.9 % 250 mL IVPB (1,250 mg Intravenous New Bag/Given 08/02/18 0032)  lactated ringers bolus 500 mL (has no administration in time range)    And  lactated ringers bolus 250 mL (has no administration in time range)  0.9 %  sodium chloride infusion (has no administration in time range)  acetaminophen (TYLENOL) tablet 650 mg (650 mg Oral Given 08/01/18 2336)  ceFEPIme (MAXIPIME) 2 g in sodium chloride 0.9 % 100 mL IVPB (0 g Intravenous Stopped 08/02/18 0058)  sodium chloride 0.9 % bolus 1,000 mL (1,000 mLs Intravenous New Bag/Given 08/01/18 2338)    Mobility walks

## 2018-08-02 NOTE — Progress Notes (Signed)
Pharmacy Antibiotic Note  Ardeth Eugenio is a 33 y.o. female admitted on 08/01/2018 with pneumonia.  Pharmacy has been consulted for cefepime and vancomycin dosing.  Plan: Cefepime 2 Gm IV q12h Vancomycin 1250 mg x1 then 1 Gm IV q24h for est AUC = 533 Goal AUC = 400-550 F/u scr/cultures/levels  Height: 5\' 1"  (154.9 cm) Weight: 125 lb (56.7 kg) IBW/kg (Calculated) : 47.8  Temp (24hrs), Avg:100.6 F (38.1 C), Min:99.9 F (37.7 C), Max:101.2 F (38.4 C)  Recent Labs  Lab 08/01/18 2227  WBC 16.0*  CREATININE 1.23*  LATICACIDVEN 2.7*    Estimated Creatinine Clearance: 50 mL/min (A) (by C-G formula based on SCr of 1.23 mg/dL (H)).    Allergies  Allergen Reactions  . Heparin Other (See Comments)    Unknown reaction    Antimicrobials this admission: 6/25 cefepime >>  6/26 vancomycin >>   Dose adjustments this admission:   Microbiology results:  BCx:   UCx:    Sputum:    MRSA PCR:   Thank you for allowing pharmacy to be a part of this patient's care.  Dorrene German 08/02/2018 1:58 AM

## 2018-08-02 NOTE — H&P (Addendum)
TRH H&P    Patient Demographics:    Krystal Short, is a 33 y.o. female  MRN: [id number]  DOB - Jan 06, 1986  Admit Date - 08/01/2018  Referring MD/NP/PA: Rodell Perna  Outpatient Primary MD for the patient is Lajean Manes, MD  Patient coming from:   home  Chief complaint- cough, dyspnea   HPI:    Krystal Short  is a 33 y.o. female, former smoker (quit 12/2016), HIV 1+, HIV2-  CD4=10(06/05/2017), c/o cough (nonproductive) and dyspnea for the past 3 days. Pt notes fever.  Pt denies chills, cp, palp, n/v, abd pain, diarrhea, brbpr, dysuria, hematuria.  Pt presented for evaluation of cough/ dyspnea.   Review of records Admission 4/28-06/11/2017 tx empirically for PCP pneumonia  Admission 1/31-03/10/2018 Acute respiratory failure secondary to pneumonia (rhinovirus) tx with rocephin/ zithromax, Bactrim for PCP Macrocytic anemia   Admission 4/21-4/24/2020 tx empirically for PCP pneumonia with Bactrim and Prednisone CT chest=> enlarged hilar lymph nodes ? Sarcoidosis   Admission 5/28-07/16/2018 tx w Vanco, cefepime, zithromax for pneumonia Bronchoscopy 6/5=> bordetella bronchiseptica tx with zithromax Hematology consulted for thrombocytopenia (not TTP)  Bone marrow biopsy 5/29 nonspecific HGSIL => needs outpatient follow up   In ED T 101.2  P 163  R 38 Bp 95/65 pox 91% Wt 56.7kg  Wbc 16.0, Hgb 6.2, Plt 22 Na 126, K 4.7, Bun 25, Creatinine 1.23  CXR IMPRESSION: Interval worsening of bilateral reticular and ground-glass infiltrates since radiograph 07/12/2018.  Pt tx with vanco iv, cefepime iv for sepsis in ED, pt will be admitted for sepsis secondary to Hcap, and anemia/ thrombocytopenia.     Review of systems:    In addition to the HPI above,    No Headache, No changes with Vision or hearing, No problems swallowing food or Liquids, No Chest pain,   No Abdominal pain, No Nausea or Vomiting,  bowel movements are regular, No Blood in stool or Urine, No dysuria, No new skin rashes or bruises, No new joints pains-aches,  No new weakness, tingling, numbness in any extremity, No recent weight gain or loss, No polyuria, polydypsia or polyphagia, No significant Mental Stressors.  All other systems reviewed and are negative.    Past History of the following :    Past Medical History:  Diagnosis Date  . Acute hyponatremia 06/03/2017  . Anemia   . CAP (community acquired pneumonia) 06/03/2017  . Community acquired pneumonia 06/05/2017  . Facial dermatitis 06/03/2017  . HIV (human immunodeficiency virus infection) ([place])   . Pleurisy 06/03/2017  . Pneumonia 06/06/2017  . 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 ([place])   . Thrush of mouth and esophagus ([place])   . UTI (urinary tract infection)       Past Surgical History:  Procedure Laterality Date  . BIOPSY  07/12/2018   Procedure: BIOPSY;  Surgeon: Laurin Coder, MD;  Location: [facility];  Service: Endoscopy;;  . BRONCHIAL WASHINGS  07/12/2018   Procedure: BRONCHIAL WASHINGS;  Surgeon: Laurin Coder, MD;  Location: [facility];  Service: Endoscopy;;  .  ENDOBRONCHIAL ULTRASOUND N/A 07/12/2018   Procedure: ENDOBRONCHIAL ULTRASOUND;  Surgeon: Laurin Coder, MD;  Location: [facility];  Service: Endoscopy;  Laterality: N/A;  . FINE NEEDLE ASPIRATION BIOPSY  07/12/2018   Procedure: FINE NEEDLE ASPIRATION BIOPSY;  Surgeon: Laurin Coder, MD;  Location: [facility];  Service: Endoscopy;;  . NO PAST SURGERIES    . VIDEO BRONCHOSCOPY N/A 07/12/2018   Procedure: VIDEO BRONCHOSCOPY WITHOUT FLUORO;  Surgeon: Laurin Coder, MD;  Location: [facility];  Service: Endoscopy;  Laterality: N/A;      Social History:      Social History   Tobacco Use  . Smoking status: Former Smoker    Packs/day: 0.25    Years: 8.00    Pack years: 2.00    Types: Cigarettes, Cigars    Quit date: 12/25/2016    Years  since quitting: 1.6  . Smokeless tobacco: Never Used  Substance Use Topics  . Alcohol use: Yes    Comment: weekend/socially       Family History :     Family History  Problem Relation Age of Onset  . Healthy Father   . Healthy Mother   . Asthma Paternal Grandmother        Home Medications:   Prior to Admission medications   Medication Sig Start Date End Date Taking? Authorizing Provider  azithromycin (ZITHROMAX) 250 MG tablet Zpack taper as directed 07/30/18  Yes Martyn Ehrich, NP  benzonatate (TESSALON) 100 MG capsule Take 100-200 mg by mouth 2 (two) times daily as needed for cough.   Yes [provider]  bictegravir-emtricitabine-tenofovir AF (BIKTARVY) 50-200-25 MG TABS tablet Take 1 tablet by mouth daily. 06/20/18  Yes Golden Circle, FNP  dapsone 100 MG tablet Take 1 tablet (100 mg total) by mouth daily. 07/15/18  Yes Golden Circle, FNP  ondansetron (ZOFRAN) 4 MG tablet Take 1 tablet (4 mg total) by mouth every 6 (six) hours. 05/24/18  Yes Raylene Everts, MD  predniSONE (DELTASONE) 20 MG tablet Take 2 tabs daily x 10 days; then 1 tab daily x 10 days; then half tab daily x 10 days Patient taking differently: Take 40 mg by mouth daily with breakfast.  07/30/18  Yes Martyn Ehrich, NP  acetaminophen (TYLENOL) 325 MG tablet Take 650 mg by mouth every 6 (six) hours as needed for mild pain or headache.    [provider]     Allergies:     Allergies  Allergen Reactions  . Heparin Other (See Comments)    Unknown reaction     Physical Exam:   Vitals  Blood pressure 95/65, pulse (!) 116, temperature 98 F (36.7 C), temperature source Oral, resp. rate (!) 28, height _0  (1.549 m), weight 56.7 kg, SpO2 98 %.  1.  General: axox3  2. Psychiatric: euthymic  3. Neurologic: cn2-12 intact, reflexes 2+ symmetric, diffuse with no clonus, motor 5/5 in all 4 ext  4. HEENMT:  Anicteric, pupils 1.51m symmetric, direct, consensual, near intact  Mmm Neck: no jvd, no bruit  5. Respiratory : Slight crackles left lung base > right lung base, no wheezing  6. Cardiovascular : Tachy  s1, s2,   7. Gastrointestinal:  Abd: soft, nt, nd, +bs  8. Skin:  Ext: no c/c/e,  No rash  9.Musculoskeletal:  Good ROM  No adenopathy      Data Review:    CBC Recent Labs  Lab 08/01/18 2227  WBC 16.0*  HGB 6.2*  HCT 19.5*  PLT 22*  MCV 104.3*  MCH 33.2  MCHC 31.8  RDW 20.2*  LYMPHSABS 1.6  MONOABS 0.3  EOSABS 0.0  BASOSABS 0.0   ------------------------------------------------------------------------------------------------------------------  Results for orders placed or performed during the hospital encounter of 08/01/18 (from the past 48 hour(s))  Lactic acid, plasma     Status: Abnormal   Collection Time: 08/01/18 10:27 PM  Result Value Ref Range   Lactic Acid, Venous 2.7 (HH) 0.5 - 1.9 mmol/L    Comment: CRITICAL RESULT CALLED TO, READ BACK BY AND VERIFIED WITH: Verita Lamb DOSTER AT 7628 08/02/18 [place] Performed at [facility], [place] [address] [place], [place] [address]   Comprehensive metabolic panel     Status: Abnormal   Collection Time: 08/01/18 10:27 PM  Result Value Ref Range   Sodium 126 (L) 135 - 145 mmol/L   Potassium 4.7 3.5 - 5.1 mmol/L   Chloride 93 (L) 98 - 111 mmol/L   CO2 20 (L) 22 - 32 mmol/L   Glucose, Bld 107 (H) 70 - 99 mg/dL   BUN 25 (H) 6 - 20 mg/dL   Creatinine, Ser 1.23 (H) 0.44 - 1.00 mg/dL   Calcium 7.5 (L) 8.9 - 10.3 mg/dL   Total Protein 6.9 6.5 - 8.1 g/dL   Albumin 2.0 (L) 3.5 - 5.0 g/dL   AST 44 (H) 15 - 41 U/L   ALT 21 0 - 44 U/L   Alkaline Phosphatase 122 38 - 126 U/L   Total Bilirubin 1.9 (H) 0.3 - 1.2 mg/dL   GFR calc non Af Amer 58 (L) >60 mL/min   GFR calc Af Amer >60 >60 mL/min   Anion gap 13 5 - 15    Comment: Performed at [facility], [place] [address], [place], [place] [address]  CBC WITH DIFFERENTIAL     Status: Abnormal    Collection Time: 08/01/18 10:27 PM  Result Value Ref Range   WBC 16.0 (H) 4.0 - 10.5 K/uL    Comment: WHITE COUNT CONFIRMED ON SMEAR   RBC 1.87 (L) 3.87 - 5.11 MIL/uL   Hemoglobin 6.2 (LL) 12.0 - 15.0 g/dL    Comment: REPEATED TO VERIFY THIS CRITICAL RESULT HAS VERIFIED AND BEEN CALLED TO C [place] RN BY AMELIA NAVARRO ON 06 26 2020 AT 0018, AND HAS BEEN READ BACK. CRITICAL RESULT VERIFIED    HCT 19.5 (L) 36.0 - 46.0 %   MCV 104.3 (H) 80.0 - 100.0 fL   MCH 33.2 26.0 - 34.0 pg   MCHC 31.8 30.0 - 36.0 g/dL   RDW 20.2 (H) 11.5 - 15.5 %   Platelets 22 (LL) 150 - 400 K/uL    Comment: REPEATED TO VERIFY PLATELET COUNT CONFIRMED BY SMEAR SPECIMEN CHECKED FOR CLOTS Immature Platelet Fraction may be clinically indicated, consider ordering this additional test [id number]    nRBC 0.6 (H) 0.0 - 0.2 %   Neutrophils Relative % 73 %   Neutro Abs 11.8 (H) 1.7 - 7.7 K/uL   Lymphocytes Relative 10 %   Lymphs Abs 1.6 0.7 - 4.0 K/uL   Monocytes Relative 2 %   Monocytes Absolute 0.3 0.1 - 1.0 K/uL   Eosinophils Relative 0 %   Eosinophils Absolute 0.0 0.0 - 0.5 K/uL   Basophils Relative 0 %   Basophils Absolute 0.0 0.0 - 0.1 K/uL   WBC Morphology MILD LEFT SHIFT (1-5% METAS, OCC MYELO, OCC BANDS)    Immature Granulocytes 15 %   Abs Immature Granulocytes 2.35 (  H) 0.00 - 0.07 K/uL    Comment: Performed at [facility], [place] [address] [place], [place] [address]  SARS Coronavirus 2 (CEPHEID- Performed in [place] hospital lab), Hosp Order     Status: None   Collection Time: 08/01/18 10:28 PM   Specimen: Nasopharyngeal Swab  Result Value Ref Range   SARS Coronavirus 2 NEGATIVE NEGATIVE    Comment: (NOTE) If result is NEGATIVE SARS-CoV-2 target nucleic acids are NOT DETECTED. The SARS-CoV-2 RNA is generally detectable in upper and lower  respiratory specimens during the acute phase of infection. The lowest  concentration of SARS-CoV-2 viral copies this assay can detect is 250   copies / mL. A negative result does not preclude SARS-CoV-2 infection  and should not be used as the sole basis for treatment or other  patient management decisions.  A negative result may occur with  improper specimen collection / handling, submission of specimen other  than nasopharyngeal swab, presence of viral mutation(s) within the  areas targeted by this assay, and inadequate number of viral copies  (<250 copies / mL). A negative result must be combined with clinical  observations, patient history, and epidemiological information. If result is POSITIVE SARS-CoV-2 target nucleic acids are DETECTED. The SARS-CoV-2 RNA is generally detectable in upper and lower  respiratory specimens dur ing the acute phase of infection.  Positive  results are indicative of active infection with SARS-CoV-2.  Clinical  correlation with patient history and other diagnostic information is  necessary to determine patient infection status.  Positive results do  not rule out bacterial infection or co-infection with other viruses. If result is PRESUMPTIVE POSTIVE SARS-CoV-2 nucleic acids MAY BE PRESENT.   A presumptive positive result was obtained on the submitted specimen  and confirmed on repeat testing.  While 2019 novel coronavirus  (SARS-CoV-2) nucleic acids may be present in the submitted sample  additional confirmatory testing may be necessary for epidemiological  and / or clinical management purposes  to differentiate between  SARS-CoV-2 and other Sarbecovirus currently known to infect humans.  If clinically indicated additional testing with an alternate test  methodology [phone]) is advised. The SARS-CoV-2 RNA is generally  detectable in upper and lower respiratory sp ecimens during the acute  phase of infection. The expected result is Negative. Fact Sheet for Patients:  StrictlyIdeas.no Fact Sheet for Healthcare Providers: BankingDealers.co.za  This test is not yet approved or cleared by the [place] FDA and has been authorized for detection and/or diagnosis of SARS-CoV-2 by FDA under an Emergency Use Authorization (EUA).  This EUA will remain in effect (meaning this test can be used) for the duration of the COVID-19 declaration under Section 564(b)(1) of the Act, 21 U.S.C. section 360bbb-3(b)(1), unless the authorization is terminated or revoked sooner. Performed at [facility], [place] [address] [place], [place] [address]   Lactate dehydrogenase     Status: Abnormal   Collection Time: 08/02/18 12:43 AM  Result Value Ref Range   LDH 487 (H) 98 - 192 U/L    Comment: Performed at [facility], [place] [address], [place], [place] [address]  Reticulocytes     Status: Abnormal   Collection Time: 08/02/18 12:43 AM  Result Value Ref Range   Retic Ct Pct 1.2 0.4 - 3.1 %   RBC. 1.86 (L) 3.87 - 5.11 MIL/uL   Retic Count, Absolute 22.5 19.0 - 186.0 K/uL   Immature Retic Fract 11.1 2.3 - 15.9 %  Comment: Performed at [facility], [place] [address], [place], [place] [address]  Save Smear [facility])     Status: None   Collection Time: 08/02/18 12:43 AM  Result Value Ref Range   Smear Review SMEAR STAINED AND AVAILABLE FOR REVIEW     Comment: Performed at [facility], [place] [address], [place], [place] [address]  Type and screen [place]     Status: None (Preliminary result)   Collection Time: 08/02/18 12:46 AM  Result Value Ref Range   ABO/RH(D) O POS    Antibody Screen NEG    Sample Expiration 08/05/2018,2359    Unit Number [id number]    Blood Component Type RED CELLS,LR    Unit division 00    Status of Unit ISSUED    Transfusion Status OK TO TRANSFUSE    Crossmatch Result      Compatible Performed at [facility], [place] [address] [place], [place] [address]    Unit Number [id number]    Blood Component Type  RED CELLS,LR    Unit division 00    Status of Unit ALLOCATED    Transfusion Status OK TO TRANSFUSE    Crossmatch Result Compatible   Prepare RBC     Status: None   Collection Time: 08/02/18 12:49 AM  Result Value Ref Range   Order Confirmation      ORDER PROCESSED BY BLOOD BANK Performed at [facility], [place] [address], [place], [place] [address]     Chemistries  Recent Labs  Lab 08/01/18 2227  NA 126*  K 4.7  CL 93*  CO2 20*  GLUCOSE 107*  BUN 25*  CREATININE 1.23*  CALCIUM 7.5*  AST 44*  ALT 21  ALKPHOS 122  BILITOT 1.9*   ------------------------------------------------------------------------------------------------------------------  ------------------------------------------------------------------------------------------------------------------ GFR: Estimated Creatinine Clearance: 50 mL/min (A) (by C-G formula based on SCr of 1.23 mg/dL (H)). Liver Function Tests: Recent Labs  Lab 08/01/18 2227  AST 44*  ALT 21  ALKPHOS 122  BILITOT 1.9*  PROT 6.9  ALBUMIN 2.0*   No results for input(s): LIPASE, AMYLASE in the last 168 hours. No results for input(s): AMMONIA in the last 168 hours. Coagulation Profile: No results for input(s): INR, PROTIME in the last 168 hours. Cardiac Enzymes: No results for input(s): CKTOTAL, CKMB, CKMBINDEX, TROPONINI in the last 168 hours. BNP (last 3 results) No results for input(s): PROBNP in the last 8760 hours. HbA1C: No results for input(s): HGBA1C in the last 72 hours. CBG: No results for input(s): GLUCAP in the last 168 hours. Lipid Profile: No results for input(s): CHOL, HDL, LDLCALC, TRIG, CHOLHDL, LDLDIRECT in the last 72 hours. Thyroid Function Tests: No results for input(s): TSH, T4TOTAL, FREET4, T3FREE, THYROIDAB in the last 72 hours. Anemia Panel: Recent Labs    08/02/18 0043  RETICCTPCT 1.2     --------------------------------------------------------------------------------------------------------------- Urine analysis:    Component Value Date/Time   COLORURINE YELLOW 07/04/2018 1621   APPEARANCEUR HAZY (A) 07/04/2018 1621   LABSPEC 1.009 07/04/2018 1621   PHURINE 6.0 07/04/2018 1621   GLUCOSEU NEGATIVE 07/04/2018 1621   HGBUR NEGATIVE 07/04/2018 1621   BILIRUBINUR NEGATIVE 07/04/2018 1621   KETONESUR NEGATIVE 07/04/2018 1621   PROTEINUR NEGATIVE 07/04/2018 1621   UROBILINOGEN 1.0 12/03/2011 0550   NITRITE NEGATIVE 07/04/2018 1621   LEUKOCYTESUR NEGATIVE 07/04/2018 1621      Imaging Results:    Dg Chest Port 1 View  Result Date: 08/02/2018 CLINICAL DATA:  Fever and hypoxia  EXAM: PORTABLE CHEST 1 VIEW COMPARISON:  07/12/2018, 06/18/2018, CT chest 07/05/2018 FINDINGS: Bilateral reticular and ground-glass opacity, increased compared to prior. Normal heart size. No pneumothorax. IMPRESSION: Interval worsening of bilateral reticular and ground-glass infiltrates since radiograph 07/12/2018. Electronically Signed   By: Donavan Foil M.D.   On: 08/02/2018 00:03       Assessment & Plan:    Active Problems:   Fever   HCAP (healthcare-associated pneumonia)   Sepsis ([place])  Sepsis (fever, tachycardia, rr>30) Hcap Covid -19 negative Blood culture x2 Fungal culture Resp viral culture Urine strep antigen Urine legionella antigen Cont zithromax vanco iv , cefepime iv pharmacy to dose  Anemia/ Thrombocytopenia secondary to ? dapsone Check LDH, retic ct Check ferritin, iron, tibc, b12, folate, ESR Transfuse 2 units prbc Check cbc in am Oncology consulted by ED, appreciate input  Tachycardia ? Secondary to sepsis/ fever Tele Trop I  Check cardiac echo  HIV Cont Biktarvy Hold Dapsone til seen by ID Please consult ID in am  Hyponatremia (mild) Hydrate wth ns iv Check cmp in am  DVT Prophylaxis-   SCDs   AM Labs Ordered, also please review Full Orders  Family  Communication: Admission, patients condition and plan of care including tests being ordered have been discussed with the patient and husband  who indicate understanding and agree with the plan and Code Status.  Code Status:  FULL CODE, pt husband informed that pt will be admitted to [facility] for sepsis  Admission status: Inpatient: Based on patients clinical presentation and evaluation of above clinical data, I have made determination that patient meets Inpatient criteria at this time.  Time spent in minutes : 70   [name] M.D on 08/02/2018 at 2:06 AM

## 2018-08-02 NOTE — Progress Notes (Signed)
  Echocardiogram 2D Echocardiogram has been performed.  Krystal Short 08/02/2018, 1:46 PM

## 2018-08-02 NOTE — ED Notes (Signed)
Date and time results received: 08/02/18 0038  Test: Lactic Acid Critical Value: 2.7  Name of Provider Notified: Mia EDPA  Orders Received? Or Actions Taken?: continue to monitor/see orders

## 2018-08-02 NOTE — Consult Note (Addendum)
PULMONARY / CRITICAL CARE MEDICINE   NAME:  Krystal Short, MRN:  [id number], DOB:  [birth year], LOS: 0 ADMISSION DATE:  08/01/2018, CONSULTATION DATE:  08/02/2018 REFERRING MD:  [facility], CHIEF COMPLAINT:  Sob/cough  BRIEF HISTORY:    33 yobf quit smoking 12/2016 with HIV presented with new sob evolving to moslty dry cough since 07/27/18 assoc with fever, aches in setting of most recent admit 5-28 to 07/16/18 with bordetella cultures on fob and rx with zmax and "all better" = back to nl activity level s need for 02 or saba then gradually worse sob x 6/20 s rigors, pleuritic cp, n or v or diarrhea > televist with pulm NP 6/23 rec zpak/ pred > no better so to ER 6/25 with   cxr c/w new as dz bilaterally so pccm consulted pm 08/02/18    HISTORY OF PRESENT ILLNESS     Prev w/u for sob 06/18/18 in pulmonary offfice  DOE (dyspnea on exertion) Onset April 2019 with dx of HIV and pulmonary infiltrates c/w PCP  - 06/18/2018   Walked RA  2 laps @  approx 236ft each @ slow/mod pace  stopped due to  Light headed > sob, sats 100%   Serial chest x-rays were reviewed and are consistent with resolving opportunistic infection most likely PCP with a normal exam now and the expectation that she will gradually improve.  Note however she is still anemic and her main complaint is more related to that (fatigue, lightheaded with activity).  rec finish rx per ID and f/u in 3 weeks > did not return   Past Medical History:  Diagnosis Date  . Acute hyponatremia 06/03/2017  . Anemia   . CAP (community acquired pneumonia) 06/03/2017  . Community acquired pneumonia 06/05/2017  . Facial dermatitis 06/03/2017  . HIV (human immunodeficiency virus infection) ([place])   . Pleurisy 06/03/2017  . Pneumonia 06/06/2017  . 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 ([place])   . Thrush of mouth and esophagus ([place])   . UTI (urinary tract infection)      SIGNIFICANT EVENTS:     STUDIES:     CULTURES:  COVID  19  6/25 neg Resp panel 6/26  Bordetllea PCR  6/26 MRSA screen 6/26 neg    ANTIBIOTICS:    Bicegravir 6/25 Maxepime 6/25 Vanc 6/25  zpac 6/26   LINES/TUBES:     CONSULTANTS:    SUBJECTIVE:  Ale to lie flat with min spont cough > mucoid    CONSTITUTIONAL: BP 102/69   Pulse (!) 113   Temp 98.1 F (36.7 C) (Oral)   Resp (!) 24   Ht 5\' 1"  (1.549 m)   Wt 56.7 kg   SpO2 97%   BMI 23.62 kg/m   0 2 2lpm np   I/O last 3 completed shifts: In: 1355.3 [I.V.:6.9; IV Piggyback:1348.3] Out: -         PHYSICAL EXAM: General:  Minimally ill appearing lying flat Neuro:  Alert/ approp HEENT:  Neck supple, no nodes  Cardiovascular:  RRR no s3  Lungs:  Minimal insp / exp rhonchi Abdomen:  Soft/ benign Musculoskeletal:  No deformities  Skin:  No rash      Lab Results  Component Value Date   ESRSEDRATE 140 (H) 08/02/2018   ESRSEDRATE 130 (H) 06/03/2017     Lab Results  Component Value Date   CD4TCELL 3 (L) 06/20/2018   CD4TABS <35 (L) 06/20/2018        CD4 absolute  41                                      07/12/18     RESOLVED PROBLEM LIST   ASSESSMENT AND PLAN   1) Acute resp failure in pt with very low CD4 and diffuse infiltrates / LD 487 is c/w PCP pna and dout Bordetella has anything to do with this or any form of ILD as was so much better at baseline prior to acutely worse 6/20  >>  rec Hypertonic saline, Bactrin/ IV steroids empirically vs have ID weigh in while waiting for micro studies.    2) Hyponatremia ? SIADH  - w/u per [facility]   3) severe thrombocyctopenia - f/u per Oncology      LABS  Glucose No results for input(s): GLUCAP in the last 168 hours.  BMET Recent Labs  Lab 08/01/18 2227  NA 126*  K 4.7  CL 93*  CO2 20*  BUN 25*  CREATININE 1.23*  GLUCOSE 107*    Liver Enzymes Recent Labs  Lab 08/01/18 2227  AST 44*  ALT 21  ALKPHOS 122  BILITOT 1.9*  ALBUMIN 2.0*     Electrolytes Recent Labs  Lab 08/01/18 2227  CALCIUM 7.5*    CBC Recent Labs  Lab 08/01/18 2227 08/02/18 1000  WBC 16.0* 18.0*  HGB 6.2* 9.3*  HCT 19.5* 29.0*  PLT 22* 17*    ABG No results for input(s): PHART, PCO2ART, PO2ART in the last 168 hours.  Coag's No results for input(s): APTT, INR in the last 168 hours.  Sepsis Markers Recent Labs  Lab 08/01/18 2227 08/02/18 1000  LATICACIDVEN 2.7* 4.6*    Cardiac Enzymes No results for input(s): TROPONINI, PROBNP in the last 168 hours.  PAST MEDICAL HISTORY :   She  has a past medical history of Acute hyponatremia (06/03/2017), Anemia, CAP (community acquired pneumonia) (06/03/2017), Community acquired pneumonia (06/05/2017), Facial dermatitis (06/03/2017), HIV (human immunodeficiency virus infection) ([place]), Pleurisy (06/03/2017), Pneumonia (06/06/2017), 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 ([place]), Thrush of mouth and esophagus ([place]), and UTI (urinary tract infection).  PAST SURGICAL HISTORY:  She  has a past surgical history that includes No past surgeries; Endobronchial ultrasound (N/A, 07/12/2018); Video bronchoscopy (N/A, 07/12/2018); Fine Needle Aspiration Biopsy (07/12/2018); biopsy (07/12/2018); and Bronchial washings (07/12/2018).  Allergies  Allergen Reactions  . Heparin Other (See Comments)    Unknown reaction    No current facility-administered medications on file prior to encounter.    Current Outpatient Medications on File Prior to Encounter  Medication Sig  . azithromycin (ZITHROMAX) 250 MG tablet Zpack taper as directed  . benzonatate (TESSALON) 100 MG capsule Take 100-200 mg by mouth 2 (two) times daily as needed for cough.  . bictegravir-emtricitabine-tenofovir AF (BIKTARVY) 50-200-25 MG TABS tablet Take 1 tablet by mouth daily.  . dapsone 100 MG tablet Take 1 tablet (100 mg total) by mouth daily.  . ondansetron (ZOFRAN) 4 MG tablet Take 1 tablet (4 mg total) by mouth every 6 (six) hours.  .  predniSONE (DELTASONE) 20 MG tablet Take 2 tabs daily x 10 days; then 1 tab daily x 10 days; then half tab daily x 10 days (Patient taking differently: Take 40 mg by mouth daily with breakfast. )  . acetaminophen (TYLENOL) 325 MG tablet Take 650 mg by mouth every 6 (six) hours as needed for mild pain or headache.  FAMILY HISTORY:   Her family history includes Asthma in her paternal grandmother; Healthy in her father and mother.  SOCIAL HISTORY:  She  reports that she quit smoking about 19 months ago. Her smoking use included cigarettes and cigars. She has a 2.00 pack-year smoking history. She has never used smokeless tobacco. She reports current alcohol use. She reports that she does not use drugs.       The patient is critically ill with multiple organ systems failure and requires high complexity decision making for assessment and support, frequent evaluation and titration of therapies, application of advanced monitoring technologies and extensive interpretation of multiple databases. Critical Care Time devoted to patient care services described in this note is 45 minutes.

## 2018-08-02 NOTE — Consult Note (Addendum)
[place]  [phone] Fax:([phone]) [place]  Referring MD:  Dr. Florencia Reasons  Reason for Referral: Anemia/thrombocytopenia  HPI: Krystal Short is a 33 year old female with a past medical history significant for AIDS (recent CD4 count was <35 on 06/20/2018),  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, questionable sarcoidosis (currently undergoing pulmonary work-up), ASCUS, and anemia.  The patient was initially seen by [place] during her last hospital admission from 07/04/2018 through 07/16/2018 for anemia and thrombocytopenia.  During that hospitalization, she underwent extensive work-up for her anemia and thrombocytopenia including a bone marrow biopsy.  The bone marrow biopsy performed on 07/05/2018 showed mildly hypercellular marrow with trilineage hematopoiesis.  The findings were nonspecific.  She had minimal dyspoietic changes in the erythroid precursors.  The changes were thought to be reactive due to her recurrent infections.  The patient was readmitted on 08/02/2018 due to cough and shortness of breath.  She was febrile in the emergency room with a temp of 101.2 and tachycardic.  On admission, her white blood cell count was 16.0, hemoglobin 6.2 and platelets 22,000.  Chest x-ray showed interval worsening of bilateral reticular and groundglass infiltrates since the prior x-ray on 07/12/2018.  The patient has received 2 units of packed red blood cells.  Repeat CBC from this morning is pending.  Additional labs that have resulted include an LDH of 487 and absolute reticulocyte count of 22.5.  Ferritin, iron studies, vitamin B12 level, folate RBC have been ordered but have not been drawn yet.  Her lactic acid level was elevated on admission at 2.7.  She had labs performed on 07/22/2018 which showed that her platelet count was completely normal at 204,000 her hemoglobin was stable at 10.1.  When seen today, the patient reports that she has ongoing  cough and shortness of breath.  She had fevers and chills at home.  She is afebrile this morning.  Denies headaches and dizziness.  Reports abdominal pain secondary to coughing.  Denies nausea and vomiting.  No constipation or diarrhea.  No bleeding.  Hematology was asked see the patient to make recommendations regarding her anemia and thrombocytopenia.  Past Medical History:  Diagnosis Date   Acute hyponatremia 06/03/2017   Anemia    CAP (community acquired pneumonia) 06/03/2017   Community acquired pneumonia 06/05/2017   Facial dermatitis 06/03/2017   HIV (human immunodeficiency virus infection) ([place])    Pleurisy 06/03/2017   Pneumonia 06/06/2017   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 ([place])    Thrush of mouth and esophagus ([facility])    UTI (urinary tract infection)   :    Past Surgical History:  Procedure Laterality Date   BIOPSY  07/12/2018   Procedure: BIOPSY;  Surgeon: Laurin Coder, MD;  Location: [facility];  Service: Endoscopy;;   BRONCHIAL WASHINGS  07/12/2018   Procedure: BRONCHIAL WASHINGS;  Surgeon: Laurin Coder, MD;  Location: [facility];  Service: Endoscopy;;   ENDOBRONCHIAL ULTRASOUND N/A 07/12/2018   Procedure: ENDOBRONCHIAL ULTRASOUND;  Surgeon: Laurin Coder, MD;  Location: [facility];  Service: Endoscopy;  Laterality: N/A;   FINE NEEDLE ASPIRATION BIOPSY  07/12/2018   Procedure: FINE NEEDLE ASPIRATION BIOPSY;  Surgeon: Laurin Coder, MD;  Location: [facility];  Service: Endoscopy;;   NO PAST SURGERIES     VIDEO BRONCHOSCOPY N/A 07/12/2018   Procedure: VIDEO BRONCHOSCOPY WITHOUT FLUORO;  Surgeon: Laurin Coder, MD;  Location: [facility];  Service:  Endoscopy;  Laterality: N/A;  :   CURRENT MEDS: Current Facility-Administered Medications  Medication Dose Route Frequency Provider Last Rate Last Dose   0.9 %  sodium chloride infusion   Intravenous Continuous [name], MD 100 mL/hr at 08/02/18 7654      azithromycin (ZITHROMAX) tablet 250 mg  250 mg Oral Daily [name], MD       benzonatate (TESSALON) capsule 100-200 mg  100-200 mg Oral BID PRN [name], MD       bictegravir-emtricitabine-tenofovir AF (BIKTARVY) 50-200-25 MG per tablet 1 tablet  1 tablet Oral Daily [name], MD       ceFEPIme (MAXIPIME) 2 g in sodium chloride 0.9 % 100 mL IVPB  2 g Intravenous Q12H Dorrene German, RPH       lactated ringers bolus 500 mL  500 mL Intravenous Once McDonald, Mia A, PA-C       And   lactated ringers bolus 250 mL  250 mL Intravenous Once McDonald, Mia A, PA-C       MEDLINE mouth rinse  15 mL Mouth Rinse BID [name], MD       ondansetron [employer]) injection 4 mg  4 mg Intravenous Q6H PRN [name], MD       Derrill Memo ON 08/12/2018] predniSONE (DELTASONE) tablet 20 mg  20 mg Oral Q breakfast [name], MD       Followed by   Derrill Memo ON 08/22/2018] predniSONE (DELTASONE) tablet 10 mg  10 mg Oral Q breakfast [name], MD       predniSONE (DELTASONE) tablet 40 mg  40 mg Oral Q breakfast [name], MD       [START ON 08/03/2018] vancomycin (VANCOCIN) IVPB 1000 mg/200 mL premix  1,000 mg Intravenous Q0600 Dorrene German, RPH          Allergies  Allergen Reactions   Heparin Other (See Comments)    Unknown reaction  :  Family History  Problem Relation Age of Onset   Healthy Father    Healthy Mother    Asthma Paternal Grandmother   :  Social History   Socioeconomic History   Marital status: Married    Spouse name: Not on file   Number of children: Not on file   Years of education: college   Highest education level: Bachelor's degree (e.g., BA, AB, BS)  Occupational History   Occupation: [employer]    Employer: [employer]   Financial resource strain: Not hard at all   Food insecurity    Worry: Never true    Inability: Never true   Transportation needs    Medical: No    Non-medical: No  Tobacco Use   Smoking  status: Former Smoker    Packs/day: 0.25    Years: 8.00    Pack years: 2.00    Types: Cigarettes, Cigars    Quit date: 12/25/2016    Years since quitting: 1.6   Smokeless tobacco: Never Used  Substance and Sexual Activity   Alcohol use: Yes    Comment: weekend/socially   Drug use: No   Sexual activity: Not Currently    Birth control/protection: Injection  Lifestyle   Physical activity    Days per week: 0 days    Minutes per session: 0 min   Stress: Not at all  Relationships   Social connections    Talks on phone: Once a week    Gets together: Once a week  Attends religious service: More than 4 times per year    Active member of club or organization: No    Attends meetings of clubs or organizations: Never    Relationship status: Married   Intimate partner violence    Fear of current or ex partner: No    Emotionally abused: No    Physically abused: No    Forced sexual activity: No  Other Topics Concern   Not on file  Social History Narrative   Patient does not drive, if her husband cannot provide transportation she takes uses Ly[employer] services   She has not smoked since 2018  :  REVIEW OF SYSTEMS: A comprehensive 14 point review of systems was negative except as noted in the HPI.  Exam: Patient Vitals for the past 24 hrs:  BP Temp Temp src Pulse Resp SpO2 Height Weight  08/02/18 0800 -- 97.7 F (36.5 C) Oral -- -- -- -- --  08/02/18 0612 -- -- -- 95 (!) 21 98 % -- --  08/02/18 0600 99/64 97.7 F (36.5 C) Oral 70 (!) 35 98 % -- --  08/02/18 0530 (!) 107/57 97.6 F (36.4 C) Oral (!) 108 (!) 34 97 % -- --  08/02/18 0501 (!) 92/58 97.7 F (36.5 C) Oral -- -- 98 % -- --  08/02/18 0446 -- -- -- (!) 109 (!) 33 99 % -- --  08/02/18 0400 94/73 -- -- (!) 108 (!) 30 100 % -- --  08/02/18 0312 -- 98.1 F (36.7 C) Oral -- -- -- -- --  08/02/18 0300 (!) 95/59 -- -- -- (!) 26 -- -- --  08/02/18 0224 (!) 89/61 98.6 F (37 C) Oral (!) 113 (!) 30 98 % -- --  08/02/18  0203 95/65 98 F (36.7 C) Oral (!) 116 (!) 28 98 % -- --  08/02/18 0200 96/61 -- -- (!) 116 (!) 35 97 % -- --  08/02/18 0103 -- -- -- -- -- -- 5' 1" (1.549 m) 125 lb (56.7 kg)  08/02/18 0102 -- 99.9 F (37.7 C) Oral -- -- -- -- --  08/02/18 0100 99/66 -- -- (!) 127 14 96 % -- --  08/02/18 0030 108/69 -- -- (!) 132 (!) 24 97 % -- --  08/02/18 0000 104/71 -- -- (!) 134 (!) 33 97 % -- --  08/01/18 2226 -- -- -- -- -- 96 % 5' 1" (1.549 m) 125 lb (56.7 kg)  08/01/18 2225 -- -- -- -- -- 96 % -- --  08/01/18 2218 122/64 (!) 101.2 F (38.4 C) Oral (!) 163 (!) 38 91 % -- --    General: Awake, alert, no distress. Eyes:  no scleral icterus.   ENT:  There were no oropharyngeal lesions.   Neck was without thyromegaly.   Lymphatics:  Negative cervical, supraclavicular or axillary adenopathy.   Respiratory: lungs were clear bilaterally without wheezing or crackles.  Cardiovascular:  Regular rate and rhythm, S1/S2, without murmur, rub or gallop.  There was no pedal edema.   GI:  abdomen was soft, flat, nontender, nondistended, without organomegaly.  Musculoskeletal:  no spinal tenderness of palpation of vertebral spine.   Skin exam was without ecchymosis, petechiae.   Neuro exam was nonfocal. Patient was alert and oriented.  Attention was good.   Language was appropriate.  Mood was normal without depression.  Speech was not pressured.  Thought content was not tangential.    LABS:  Lab Results  Component Value Date   WBC 16.0 (  H) 08/01/2018   HGB 6.2 (LL) 08/01/2018   HCT 19.5 (L) 08/01/2018   PLT 22 (LL) 08/01/2018   GLUCOSE 107 (H) 08/01/2018   CHOL 56 04/02/2018   TRIG 312 (H) 05/28/2018   HDL <5 (L) 04/02/2018   [place]  04/02/2018     Comment:     Result not calculated because one or more required values exceed analytical limits. Reference range: <100 . Desirable range <100 mg/dL for primary prevention;   <70 mg/dL for patients with CHD or diabetic patients  with > or = 2 CHD risk  factors. Marland Kitchen LDL-C is now calculated using the Martin-Hopkins  calculation, which is a validated novel method providing  better accuracy than the Friedewald equation in the  estimation of LDL-C.  Cresenciano Genre et al. Annamaria Helling. 3007;622(63): 2061-2068  (http://education.[facility].com/faq/FAQ164)    ALT 21 08/01/2018   AST 44 (H) 08/01/2018   NA 126 (L) 08/01/2018   K 4.7 08/01/2018   CL 93 (L) 08/01/2018   CREATININE 1.23 (H) 08/01/2018   BUN 25 (H) 08/01/2018   CO2 20 (L) 08/01/2018   INR 1.0 07/16/2018    Ct Chest W Contrast  Result Date: 07/04/2018 CLINICAL DATA:  Increased shortness of breath EXAM: CT CHEST WITH CONTRAST TECHNIQUE: Multidetector CT imaging of the chest was performed during intravenous contrast administration. CONTRAST:  33m OMNIPAQUE IOHEXOL 300 MG/ML  SOLN COMPARISON:  05/28/2018 FINDINGS: Cardiovascular: No significant vascular findings. Normal heart size. No pericardial effusion. Mediastinum/Nodes: Stable mediastinal and bilateral hilar lymphadenopathy measuring up to 17 mm in short axis along the right paratracheal location. Normal trachea. Normal esophagus. Normal thyroid gland. Lungs/Pleura: Diffuse bilateral interstitial thickening and fissural thickening. Peripheral sub pleural thickening again noted bilaterally. Abnormality involves bilateral upper and lower lungs with the lung bases least affected. Trace left pleural effusion. No right pleural effusion. Upper Abdomen: No acute abnormality. Musculoskeletal: No chest wall abnormality. No acute or significant osseous findings. IMPRESSION: 1. Worsening diffuse bilateral interstitial and fissural thickening with sub pleural thickening and scattered areas of ground-glass opacity. Differential considerations include chronic interstitial lung disease such as sarcoidosis versus atypical infection given the patient's history of HIV. Electronically Signed   By: HKathreen Devoid  On: 07/04/2018 18:34   Ct Abdomen Pelvis W  Contrast  Result Date: 07/11/2018 CLINICAL DATA:  Hepatomegaly.  Patient has no current complaints. EXAM: CT ABDOMEN AND PELVIS WITH CONTRAST TECHNIQUE: Multidetector CT imaging of the abdomen and pelvis was performed using the standard protocol following bolus administration of intravenous contrast. CONTRAST:  1056mOMNIPAQUE IOHEXOL 300 MG/ML SOLN, 3053mMNIPAQUE IOHEXOL 300 MG/ML SOLN COMPARISON:  CT chest dated Jul 04, 2018. FINDINGS: Lower chest: Improving septal thickening and ground-glass density at the lung bases. Resolved pleural effusions. Hepatobiliary: Mild hepatomegaly. No focal liver abnormality. The gallbladder is unremarkable. No biliary dilatation. Pancreas: Unremarkable. No pancreatic ductal dilatation or surrounding inflammatory changes. Spleen: Mild splenomegaly.  No focal abnormality. Adrenals/Urinary Tract: Adrenal glands are unremarkable. Kidneys are normal, without renal calculi, focal lesion, or hydronephrosis. Bladder is unremarkable. Stomach/Bowel: Small hiatal hernia. The stomach is otherwise within normal limits. No bowel wall thickening, distention, or surrounding inflammatory changes. Normal appendix. Vascular/Lymphatic: No significant vascular findings are present. No enlarged abdominal or pelvic lymph nodes. Reproductive: Uterus and bilateral adnexa are unremarkable. Tampon in the vagina. Other: Trace free fluid in the pelvis is likely physiologic. No pneumoperitoneum. Musculoskeletal: No acute or significant osseous findings. Degenerative disc disease at L5-S1. IMPRESSION: 1. Mild hepatosplenomegaly.  No focal abnormality.  2. Improving septal thickening and ground-glass density at the lung bases, favor resolving atypical infection given history of HIV. Electronically Signed   By: Titus Dubin M.D.   On: 07/11/2018 18:16   Ct Biopsy  Result Date: 07/05/2018 INDICATION: History of HIV/AIDS, now with anemia and thrombocytopenia. Please perform CT-guided bone marrow biopsy for  tissue diagnostic purposes. Please obtain a sample for additional culture analysis. EXAM: CT-GUIDED BONE MARROW BIOPSY AND ASPIRATION MEDICATIONS: None ANESTHESIA/SEDATION: Versed 2 mg IV Sedation Time: 10 Minutes; The patient was continuously monitored during the procedure by the interventional radiology nurse under my direct supervision. COMPLICATIONS: None immediate. PROCEDURE: Informed consent was obtained from the patient following an explanation of the procedure, risks, benefits and alternatives. The patient understands, agrees and consents for the procedure. All questions were addressed. A time out was performed prior to the initiation of the procedure. The patient was positioned prone and non-contrast localization CT was performed of the pelvis to demonstrate the iliac marrow spaces. The operative site was prepped and draped in the usual sterile fashion. Under sterile conditions and local anesthesia, a 22 gauge spinal needle was utilized for procedural planning. Next, an 11 gauge coaxial bone biopsy needle was advanced into the left iliac marrow space. Needle position was confirmed with CT imaging. Initially, bone marrow aspiration was performed. Next, a bone marrow biopsy was obtained with the 11 gauge outer bone marrow device. Samples were prepared with the cytotechnologist and deemed adequate. The needle was removed intact. Hemostasis was obtained with compression and a dressing was placed. The patient tolerated the procedure well without immediate post procedural complication. IMPRESSION: Successful CT guided left iliac bone marrow aspiration and core biopsy. Note, a portion of the core biopsy with set aside for culture analysis. Electronically Signed   By: Sandi Mariscal M.D.   On: 07/05/2018 11:43   Dg Chest Port 1 View  Result Date: 08/02/2018 CLINICAL DATA:  Fever and hypoxia EXAM: PORTABLE CHEST 1 VIEW COMPARISON:  07/12/2018, 06/18/2018, CT chest 07/05/2018 FINDINGS: Bilateral reticular and  ground-glass opacity, increased compared to prior. Normal heart size. No pneumothorax. IMPRESSION: Interval worsening of bilateral reticular and ground-glass infiltrates since radiograph 07/12/2018. Electronically Signed   By: Donavan Foil M.D.   On: 08/02/2018 00:03   Dg Chest Port 1 View  Result Date: 07/12/2018 CLINICAL DATA:  Status post endobronchial ultrasound in aspiration. EXAM: PORTABLE CHEST 1 VIEW COMPARISON:  Radiograph of Jul 04, 2018. FINDINGS: The heart size and mediastinal contours are within normal limits. Both lungs are clear. No pneumothorax or pleural effusion is noted. The visualized skeletal structures are unremarkable. IMPRESSION: No active disease. Electronically Signed   By: Marijo Conception M.D.   On: 07/12/2018 15:22   Dg Chest Portable 1 View  Result Date: 07/04/2018 CLINICAL DATA:  Worsening shortness of breath and cough EXAM: PORTABLE CHEST 1 VIEW COMPARISON:  06/18/2018 FINDINGS: There is bilateral diffuse reticular interstitial thickening. There is no focal consolidation. There is no pleural effusion or pneumothorax. The heart and mediastinal contours are unremarkable. The osseous structures are unremarkable. IMPRESSION: Bilateral diffuse reticular interstitial thickening which may reflect mild interstitial edema versus pneumonia including atypical pneumonia. Electronically Signed   By: Kathreen Devoid   On: 07/04/2018 12:57   Ct Bone Marrow Biopsy & Aspiration  Result Date: 07/05/2018 INDICATION: History of HIV/AIDS, now with anemia and thrombocytopenia. Please perform CT-guided bone marrow biopsy for tissue diagnostic purposes. Please obtain a sample for additional culture analysis. EXAM: CT-GUIDED BONE MARROW BIOPSY  AND ASPIRATION MEDICATIONS: None ANESTHESIA/SEDATION: Versed 2 mg IV Sedation Time: 10 Minutes; The patient was continuously monitored during the procedure by the interventional radiology nurse under my direct supervision. COMPLICATIONS: None immediate.  PROCEDURE: Informed consent was obtained from the patient following an explanation of the procedure, risks, benefits and alternatives. The patient understands, agrees and consents for the procedure. All questions were addressed. A time out was performed prior to the initiation of the procedure. The patient was positioned prone and non-contrast localization CT was performed of the pelvis to demonstrate the iliac marrow spaces. The operative site was prepped and draped in the usual sterile fashion. Under sterile conditions and local anesthesia, a 22 gauge spinal needle was utilized for procedural planning. Next, an 11 gauge coaxial bone biopsy needle was advanced into the left iliac marrow space. Needle position was confirmed with CT imaging. Initially, bone marrow aspiration was performed. Next, a bone marrow biopsy was obtained with the 11 gauge outer bone marrow device. Samples were prepared with the cytotechnologist and deemed adequate. The needle was removed intact. Hemostasis was obtained with compression and a dressing was placed. The patient tolerated the procedure well without immediate post procedural complication. IMPRESSION: Successful CT guided left iliac bone marrow aspiration and core biopsy. Note, a portion of the core biopsy with set aside for culture analysis. Electronically Signed   By: Sandi Mariscal M.D.   On: 07/05/2018 11:43   PATHOLOGY:  Diagnosis Bone Marrow, Aspirate,Biopsy, and Clot BONE MARROW: - MILDLY HYPERCELLULAR MARROW WITH TRILINEAGE HEMATOPOIESIS - SEE COMMENT PERIPHERAL BLOOD: - NORMOCYTIC ANEMIA AND THROMBOCYTOPENIA - SEE COMPLETE BLOOD COUNT Diagnosis Note Overall, the findings in the marrow are largely non-specific. There are minimal dyspoietic changes in the erythroid precursors. No granulomata or evidence of infection are seen. One noticeable finding, the significance of which is not known is the presence of hemophagocytic forms; clinical correlation is  recommended. This case was discussed with Dr. Irene Limbo on June 2 and 3, 2020. Thressa Sheller MD Pathologist, Electronic Signature (Case signed 07/10/2018)  ASSESSMENT AND PLAN:  1. Anemia  2. Thrombocytopenia 3. PCP , COVid19 neg 4. Sepsis  5. AKI 6. HIV/AIDS  -Anemia thrombocytopenia are likely multifactorial.  She has no evidence of TTP/MAHA based on current labs. Peripheral smear available for review.  -Bone marrow biopsy results showed no overt evidence of granulomas lymphoma AFB or other overt viral inclusions.  No overt evidence of MDS-like changes related to HIV. No megaloblastoid changes. Some minor hemophagocytic forms noted in the bone marrow which is likely reactive given her recurrent infections. No acute interventions for the minor hemophagocytic changes at this time other than treating underlying infectious process or acute inflammatory process.  -It appears that she has remained off Bactrim but is currently taking dapsone 100 mg daily.  Dapsone has been placed on hold pending infectious disease consult.  -She is not currently on folic acid or a B complex vitamin.  I have reordered these.  She should continue folic acid and B complex vitamin to support hematopoiesis. -Transfuse packed red blood cells for hemoglobin less than 7.0 or active bleeding.    CBC is pending today. -Transfuse platelets for platelet count less than 20,000 if active bleeding or in the setting of sepsis.  She is not currently bleeding.  CBC is pending. -Please do 1 hour post count after each use of platelets.  Thank you for this referral.  Mikey Bussing, DNP, AGPCNP-BC, AOCNP  ADDENDUM  .Patient was Personally and independently interviewed, examined  and relevant elements of the history of present illness were reviewed in details and an assessment and plan was created. All elements of the patient's history of present illness , assessment and plan were discussed in details with Mikey Bussing, DNP. The above  documentation reflects our combined findings assessment and plan.  Patient was seen for anemia and thrombocytopenia due to during her last hospitalization with sepsis and pneumonia as well.  She had no evidence of TTP/MAHA.  Bone marrow biopsy was done and showed no overt viral inclusions, no evidence of lymphoma or overt MDS-like changes related to HIV. There were some minor hemophagocytic forms in the bone marrow which were likely reactive related to her recurrent infections. She was also on Bactrim which was discontinued on 07/10/2018.  After discharge on Bactrim her CBC on 07/22/2018 showed stable hemoglobin of 10.1 normal WBC count of 6.7k and resolution of her thrombocytopenia with platelets of 204k. She had also been on steroids along with Bactrim for empiric PCP treatment and this could have helped any immune competent as well.  She was started on dapsone.  Her last admission peripheral blood smear showed some suggestion of bite cells and though her haptoglobin was normal could not rule out possibility of some hemolysis from dapsone. [place]om[place] test was negative  She now is readmitted with sepsis-like presentation with worsening pulmonary infiltrates likely healthcare acquired pneumonia in the setting of severe immunosuppression from her AIDS with low CD4 counts below 50. On current admission her CBC showed a hemoglobin of 6.2 with an MCV of 104, platelets are down to 22k and elevation of white counts to 16k with predominant neutrophilia.  Peripheral blood smear shows no evidence of increased schistocytes.  She does have some increased anisopoikilocytosis.  Myeloid left shift with increased bands pelgeroid neutrophils and toxic granulation.  Increased large granular lymphocytes.  Decreased platelets with no overt platelet clumping.  Large platelets noted.  No increased blasts.  Overall picture is consistent with severe sepsis which appears to be driving the patient's worsened blood counts at this  time.  Recurrent sepsis could potentially flare her secondary HLH/ hemophagocytosis which could also worsen cytopenias.  The significantly elevated ferritin to 5k would be concerning for early signs of this. LDH is elevated to 487.  Likely from her worsening pulmonary pathology would get a haptoglobin to rule out findings of hemolysis.  Patient had inadequate reticulocyte response even in the setting of severe anemia suggesting some element of bone marrow suppression in the setting of sepsis and is less consistent with overt hemolysis.  Plan -Transfuse PRBCs PRN for hemoglobin of less than 7 or if symptomatic -Transfuse platelets as needed for platelets of less than 20k in the setting of sepsis, or transfuse if actively bleeding. -Primary goal would to have the patient sepsis under control -Her antibiotics could potentially also add to thrombocytopenia and will need to be watched. -Empiric folic acid and B complex. -If worsening cytopenias might need to consider empirically using high-dose steroids to treat secondary hemophagocytosis due to recurrent severe sepsis.  -would consider Dapsone alternative if possible.   Sullivan Lone MD MS

## 2018-08-02 NOTE — Plan of Care (Signed)
  Problem: Education: Goal: Knowledge of General Education information will improve Description Including pain rating scale, medication(s)/side effects and non-pharmacologic comfort measures Outcome: Progressing   

## 2018-08-02 NOTE — Progress Notes (Signed)
PROGRESS NOTE  Krystal Short ZCH:[id number] DOB: [birth year] DOA: 08/01/2018 PCP: Lajean Manes, MD  HPI/Recap of past 24 hours:  Patient is admitted after midnight , last fever on presentation was 101.2, she has not spiked fever since then She continue to have dry /nonproductive cough, reports being sore from coughing but denies chest pain,  she appear weak and has tachypnea and sinus tachycardia, but does not use accessory muscle , does not appear in significant distress bp stable, she is on 2liter o2, no hypoxia at rest   Assessment/Plan: Active Problems:   Hyponatremia   Symptomatic anemia   Thrombocytopenia (HCC)   Fever   HCAP (healthcare-associated pneumonia)   Sepsis ([place])  Acute hypoxic respiratory failure with fever , bilateral lung infiltrate, sepsis on presentation -SARS COV 2 screening negative -concerning for pcp , she is on broad spectrum abx, steroids,  ID/pulm/critical care consulted  Patient c/o 10/10 chest pain /back pain High sensitivity troponin unremarkable, will get CTA chest/ab to r/o dissection  Prn morphin  Hyponatremia: from dehydration vs SIADH, she already received multiple fluids bolus, urine sodium /osmo test will not be reliable Improving  Acute normocytic anemia/acute thrombocytopenia: S/p prbc transfusionx2 , hgb improved from 6 to 9, plt 17, transfuse plt in the setting of sepsis Hematology consulted, will follow recommendation  Elevated blood glucose, appear has been chronic No prior diagnosis of diabetes, will check a1c, could be from stress, steroids  HIV/AIDS CD4=10 06/05/2017  Code Status: full  Family Communication: patient   Disposition Plan: critically ill, remain in ICU/stepdown   Consultants:  Hematology  ID  Pulmonology/critical care  Procedures:  prbc transfusion  plt transfusion   Antibiotics:  As above   Objective: BP 117/81   Pulse (!) 121   Temp 97.8 F (36.6 C) (Oral)   Resp (!) 30   Ht 5'  1" (1.549 m)   Wt 56.7 kg   SpO2 99%   BMI 23.62 kg/m   Intake/Output Summary (Last 24 hours) at 08/02/2018 1250 Last data filed at 08/02/2018 1000 Gross per 24 hour  Intake 1700.13 ml  Output -  Net 1700.13 ml   Filed Weights   08/01/18 2226 08/02/18 0103  Weight: 56.7 kg 56.7 kg    Exam: Patient is examined daily including today on 08/02/2018, exams remain the same as of yesterday except that has changed    General:  Weak, tachypnea, alert, oriented x3  Cardiovascular: sinus tachycardia  Respiratory: slightly course, no wheezing, no rales, no rhonchi   Abdomen: Soft/ND/NT, positive BS  Musculoskeletal: No Edema  Neuro: alert, oriented   Data Reviewed: Basic Metabolic Panel: Recent Labs  Lab 08/01/18 2227  NA 126*  K 4.7  CL 93*  CO2 20*  GLUCOSE 107*  BUN 25*  CREATININE 1.23*  CALCIUM 7.5*   Liver Function Tests: Recent Labs  Lab 08/01/18 2227  AST 44*  ALT 21  ALKPHOS 122  BILITOT 1.9*  PROT 6.9  ALBUMIN 2.0*   No results for input(s): LIPASE, AMYLASE in the last 168 hours. No results for input(s): AMMONIA in the last 168 hours. CBC: Recent Labs  Lab 08/01/18 2227 08/02/18 1000  WBC 16.0* 18.0*  NEUTROABS 11.8*  --   HGB 6.2* 9.3*  HCT 19.5* 29.0*  MCV 104.3* 95.7  PLT 22* 17*   Cardiac Enzymes:   No results for input(s): CKTOTAL, CKMB, CKMBINDEX, TROPONINI in the last 168 hours. BNP (last 3 results) Recent Labs    03/08/18 2129  BNP  8.8    ProBNP (last 3 results) No results for input(s): PROBNP in the last 8760 hours.  CBG: No results for input(s): GLUCAP in the last 168 hours.  Recent Results (from the past 240 hour(s))  Blood Culture (routine x 2)     Status: None (Preliminary result)   Collection Time: 08/01/18 10:27 PM   Specimen: BLOOD  Result Value Ref Range Status   Specimen Description   Final    BLOOD SITE NOT SPECIFIED Performed at [facility], [address], [place], [place] [address]    Special  Requests   Final    BOTTLES DRAWN AEROBIC AND ANAEROBIC Blood Culture adequate volume Performed at [facility] [address], [place], [place] [address]    Culture PENDING  Incomplete   Report Status PENDING  Incomplete  SARS Coronavirus 2 (CEPHEID- Performed in [facility] hospital lab), Hosp Order     Status: None   Collection Time: 08/01/18 10:28 PM   Specimen: Nasopharyngeal Swab  Result Value Ref Range Status   SARS Coronavirus 2 NEGATIVE NEGATIVE Final    Comment: (NOTE) If result is NEGATIVE SARS-CoV-2 target nucleic acids are NOT DETECTED. The SARS-CoV-2 RNA is generally detectable in upper and lower  respiratory specimens during the acute phase of infection. The lowest  concentration of SARS-CoV-2 viral copies this assay can detect is 250  copies / mL. A negative result does not preclude SARS-CoV-2 infection  and should not be used as the sole basis for treatment or other  patient management decisions.  A negative result may occur with  improper specimen collection / handling, submission of specimen other  than nasopharyngeal swab, presence of viral mutation(s) within the  areas targeted by this assay, and inadequate number of viral copies  (<250 copies / mL). A negative result must be combined with clinical  observations, patient history, and epidemiological information. If result is POSITIVE SARS-CoV-2 target nucleic acids are DETECTED. The SARS-CoV-2 RNA is generally detectable in upper and lower  respiratory specimens dur ing the acute phase of infection.  Positive  results are indicative of active infection with SARS-CoV-2.  Clinical  correlation with patient history and other diagnostic information is  necessary to determine patient infection status.  Positive results do  not rule out bacterial infection or co-infection with other viruses. If result is PRESUMPTIVE POSTIVE SARS-CoV-2 nucleic acids MAY BE PRESENT.   A presumptive positive result  was obtained on the submitted specimen  and confirmed on repeat testing.  While 2019 novel coronavirus  (SARS-CoV-2) nucleic acids may be present in the submitted sample  additional confirmatory testing may be necessary for epidemiological  and / or clinical management purposes  to differentiate between  SARS-CoV-2 and other Sarbecovirus currently known to infect humans.  If clinically indicated additional testing with an alternate test  methodology [phone]) is advised. The SARS-CoV-2 RNA is generally  detectable in upper and lower respiratory sp ecimens during the acute  phase of infection. The expected result is Negative. Fact Sheet for Patients:  StrictlyIdeas.no Fact Sheet for Healthcare Providers: BankingDealers.co.za This test is not yet approved or cleared by the [place] FDA and has been authorized for detection and/or diagnosis of SARS-CoV-2 by FDA under an Emergency Use Authorization (EUA).  This EUA will remain in effect (meaning this test can be used) for the duration of the COVID-19 declaration under Section 564(b)(1) of the Act, 21 U.S.C. section 360bbb-3(b)(1), unless the authorization is terminated or revoked sooner.  Performed at [facility] [place] [address] [place], [place] [address]   MRSA PCR Screening     Status: None   Collection Time: 08/02/18  3:11 AM   Specimen: Nasal Mucosa; Nasopharyngeal  Result Value Ref Range Status   MRSA by PCR NEGATIVE NEGATIVE Final    Comment:        The GeneXpert MRSA Assay (FDA approved for NASAL specimens only), is one component of a comprehensive MRSA colonization surveillance program. It is not intended to diagnose MRSA infection nor to guide or monitor treatment for MRSA infections. Performed at [facility], [place] [address], [place], [place] [address]      Studies: Dg Chest Port 1 View  Result Date: 08/02/2018 CLINICAL DATA:   Fever and hypoxia EXAM: PORTABLE CHEST 1 VIEW COMPARISON:  07/12/2018, 06/18/2018, CT chest 07/05/2018 FINDINGS: Bilateral reticular and ground-glass opacity, increased compared to prior. Normal heart size. No pneumothorax. IMPRESSION: Interval worsening of bilateral reticular and ground-glass infiltrates since radiograph 07/12/2018. Electronically Signed   By: Donavan Foil M.D.   On: 08/02/2018 00:03    Scheduled Meds: . sodium chloride   Intravenous Once  . azithromycin  250 mg Oral Daily  . B-complex with vitamin C  1 tablet Oral Daily  . bictegravir-emtricitabine-tenofovir AF  1 tablet Oral Daily  . folic acid  2 mg Oral Daily  . mouth rinse  15 mL Mouth Rinse BID  . [START ON 08/12/2018] predniSONE  20 mg Oral Q breakfast   Followed by  . [START ON 08/22/2018] predniSONE  10 mg Oral Q breakfast  . predniSONE  40 mg Oral Q breakfast    Continuous Infusions: . sodium chloride 100 mL/hr at 08/02/18 1000  . ceFEPime (MAXIPIME) IV 2 g (08/02/18 1000)  . lactated ringers     And  . lactated ringers    . [START ON 08/03/2018] vancomycin       Total Critical Care time in examining the patient bedside, evaluating Lab work and other data, over half of the total time was spent in coordinating patient care on the floor or bedisde in talking to patient/family members, communicating with nursing Staff on the floor and sub specialists ID, pulmnology/critical care to coordinate patients medical care and needs is 75 Minutes.  The condition which has caused critical injury/acute impairment of vital organ system with a high probability of sudden clinically significant deterioration and can cause Potential Life threatening injury to this patient addressed today is acute hypoxia, respiratory distress, pancytopenia, hyponatremia, HIV/AIDs with very low cd4counts   I have personally reviewed and interpreted on  08/02/2018 daily labs, tele strips, imagings as discussed above under date review session and  assessment and plans.  I reviewed all nursing notes, pharmacy notes, consultant notes,  vitals, pertinent old records  I have discussed plan of care as described above with RN , patient on 08/02/2018   Florencia Reasons MD, PhD  [facility] Pager [phone]. If 7PM-7AM, please contact night-coverage at [email or web address], password [facility] 08/02/2018, 12:50 PM  LOS: 0 days

## 2018-08-02 NOTE — Consult Note (Signed)
[facility] for Infectious Disease    Date of Admission:  08/01/2018           Day 3 azithromycin        Day 1 vancomycin        Day 1 cefepime       Reason for Consult: Recurrent fever and pulmonary infiltrates    Referring Provider: Dr. Florencia Reasons  Assessment: The cause of her recurrent pneumonitis, fever, anemia and thrombocytopenia remains uncertain.  It is unusual for HIV infected individuals to develop pneumocystis pneumonia once their viral load is undetectable even when they have not had significant CD4 reconstitution.  Nonetheless I agree with retreating her for possible pneumocystis and trying to collect sputum for examination.  I will treat her with clindamycin and primaquine to avoid any adverse reaction to trimethoprim sulfamethoxazole (further bone marrow suppression or hyperkalemia which she had on her last admission).  Continue cefepime and azithromycin but do not think that she needs MRSA coverage.  I do not see that she has been screened for parvovirus infection.  Given her profound cytopenias I will do that now.  Plan: 1. Start clindamycin and primaquine 2. Continue cefepime and azithromycin 3. Discontinue vancomycin 4. Continue prednisone 5. Agree with sputum studies and respiratory viral panels 6. Parvovirus serology  Active Problems:   Pulmonary infiltrates   Fever   Symptomatic anemia   Thrombocytopenia (HCC)   Hyponatremia   AIDS (acquired immune deficiency syndrome) (HCC)   Molluscum contagiosum   Sepsis (HCC)   Scheduled Meds: . sodium chloride   Intravenous Once  . azithromycin  250 mg Oral Daily  . B-complex with vitamin C  1 tablet Oral Daily  . bictegravir-emtricitabine-tenofovir AF  1 tablet Oral Daily  . folic acid  2 mg Oral Daily  . mouth rinse  15 mL Mouth Rinse BID  . [START ON 08/12/2018] predniSONE  20 mg Oral Q breakfast   Followed by  . [START ON 08/22/2018] predniSONE  10 mg Oral Q breakfast  . predniSONE  40 mg Oral Q  breakfast   Continuous Infusions: . sodium chloride 100 mL/hr at 08/02/18 1400  . ceFEPime (MAXIPIME) IV Stopped (08/02/18 1030)  . lactated ringers     And  . lactated ringers    . [START ON 08/03/2018] vancomycin     PRN Meds:.benzonatate, ondansetron (ZOFRAN) IV  HPI: Krystal Short is a 33 y.o. female who was admitted to the hospital in April 2019 with pneumonia.  She was discovered to have advanced HIV infection with a CD4 count of 10.  Her viral load was only 124 at that time.  Respiratory virus panel was positive for common coronavirus.  She was treated for presumed pneumocystis pneumonia with trimethoprim sulfamethoxazole and prednisone and had prompt improvement.  She was started on Biktarvy and kept on pneumocystis prophylaxis.  It appears that her adherence has been excellent.  However, she is now in her fifth hospitalization with similar presentations.  Each time she comes in with dry cough, fever, hypoxia and diffuse reticulonodular infiltrates.  She has also developed a severe, symptomatic anemia and thrombocytopenia.  During her last hospitalization she underwent bone marrow biopsy which showed only mildly hypercellular marrow with no granuloma or tumor.  She also had a CT of her abdomen and pelvis which revealed mild hepatosplenomegaly.  Previous CT of her chest had shown her bilateral infiltrates and hilar adenopathy.  She underwent a bronchoscopy on 07/12/2018.  Right upper lobe biopsy showed scant lung parenchyma with fibrosis and reactive changes.  Cytology was negative.  BAL cultures grew Bordetella bronchiseptica, the usual cause of canine kennel cough.  I treated her with a week of doxycycline.  Since her last hospitalization she has continued on Biktarvy, dapsone and prednisone 40 mg daily.  She felt like she was back to normal upon discharge several weeks ago.  She traveled to [place] for 1 day.  One week ago she began to feel much worse with severe dry cough and  progressive dyspnea.  She was seen by pulmonary on 07/30/2018 and started on azithromycin.  She continued prednisone 40 mg daily. She did not improve leading to admission early this morning.  Her temperature was 101.2 degrees.  She was tachypneic and mildly hypoxic.  Chest x-ray showed worsening reticular and groundglass infiltrates diffusely.  Her hemoglobin was down to 6.2 and her platelets were down to 22,000.  Repeat testing for COVID-19 was negative.  Her HIV viral load has been undetectable on 3 separate occasions this year.  She has had only modest CD4 reconstitution to 41.  She denies missing any doses of her medications.   Review of Systems: Review of Systems  Constitutional: Positive for chills, fever and malaise/fatigue. Negative for diaphoresis and weight loss.  HENT: Negative for congestion and sore throat.   Respiratory: Positive for cough and shortness of breath. Negative for sputum production and wheezing.   Cardiovascular: Positive for chest pain.  Gastrointestinal: Negative for abdominal pain, diarrhea, nausea and vomiting.  Genitourinary: Negative for dysuria.  Musculoskeletal: Negative for back pain, joint pain and myalgias.  Skin: Negative for rash.  Neurological: Negative for headaches.  Psychiatric/Behavioral: Negative for depression and substance abuse.    Past Medical History:  Diagnosis Date  . Acute hyponatremia 06/03/2017  . Anemia   . CAP (community acquired pneumonia) 06/03/2017  . Community acquired pneumonia 06/05/2017  . Facial dermatitis 06/03/2017  . HIV (human immunodeficiency virus infection) ([place])   . Pleurisy 06/03/2017  . Pneumonia 06/06/2017  . 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 Thrush of mouth and esophagus ([place])   . UTI (urinary tract infection)     Social History   Tobacco Use  . Smoking status: Former Smoker    Packs/day: 0.25    Years: 8.00    Pack years: 2.00    Types: Cigarettes, Cigars    Quit date:  12/25/2016    Years since quitting: 1.6  . Smokeless tobacco: Never Used  Substance Use Topics  . Alcohol use: Yes    Comment: weekend/socially  . Drug use: No    Family History  Problem Relation Age of Onset  . Healthy Father   . Healthy Mother   . Asthma Paternal Grandmother    Allergies  Allergen Reactions  . Heparin Other (See Comments)    Unknown reaction    OBJECTIVE: Blood pressure 102/69, pulse (!) 113, temperature 98.1 F (36.7 C), temperature source Oral, resp. rate (!) 24, height _0  (1.549 m), weight 56.7 kg, SpO2 97 %.  Physical Exam Constitutional:      Comments: She is in moderate respiratory distress with frequent dry cough.  HENT:     Mouth/Throat:     Pharynx: No oropharyngeal exudate.  Cardiovascular:     Rate and Rhythm: Regular rhythm.     Heart sounds: No murmur.     Comments: She is tachycardic Pulmonary:     Breath  sounds: No wheezing, rhonchi or rales.     Comments: She is tachypneic. Abdominal:     Palpations: Abdomen is soft. There is no mass.     Tenderness: There is no abdominal tenderness.  Skin:    Comments: Lesions on face compatible with molluscum.     Lab Results Lab Results  Component Value Date   WBC 18.0 (H) 08/02/2018   HGB 9.3 (L) 08/02/2018   HCT 29.0 (L) 08/02/2018   MCV 95.7 08/02/2018   PLT 17 (LL) 08/02/2018    Lab Results  Component Value Date   CREATININE 1.23 (H) 08/01/2018   BUN 25 (H) 08/01/2018   NA 126 (L) 08/01/2018   K 4.7 08/01/2018   CL 93 (L) 08/01/2018   CO2 20 (L) 08/01/2018    Lab Results  Component Value Date   ALT 21 08/01/2018   AST 44 (H) 08/01/2018   ALKPHOS 122 08/01/2018   BILITOT 1.9 (H) 08/01/2018     Microbiology: Recent Results (from the past 240 hour(s))  Blood Culture (routine x 2)     Status: None (Preliminary result)   Collection Time: 08/01/18 10:27 PM   Specimen: BLOOD  Result Value Ref Range Status   Specimen Description   Final    BLOOD SITE NOT SPECIFIED  Performed at [facility], [place] [address] [place], [place] [address]    Special Requests   Final    BOTTLES DRAWN AEROBIC AND ANAEROBIC Blood Culture adequate volume Performed at [facility] [address] [place], [place] [address]    Culture PENDING  Incomplete   Report Status PENDING  Incomplete  SARS Coronavirus 2 (CEPHEID- Performed in [facility] hospital lab), Hosp Order     Status: None   Collection Time: 08/01/18 10:28 PM   Specimen: Nasopharyngeal Swab  Result Value Ref Range Status   SARS Coronavirus 2 NEGATIVE NEGATIVE Final    Comment: (NOTE) If result is NEGATIVE SARS-CoV-2 target nucleic acids are NOT DETECTED. The SARS-CoV-2 RNA is generally detectable in upper and lower  respiratory specimens during the acute phase of infection. The lowest  concentration of SARS-CoV-2 viral copies this assay can detect is 250  copies / mL. A negative result does not preclude SARS-CoV-2 infection  and should not be used as the sole basis for treatment or other  patient management decisions.  A negative result may occur with  improper specimen collection / handling, submission of specimen other  than nasopharyngeal swab, presence of viral mutation(s) within the  areas targeted by this assay, and inadequate number of viral copies  (<250 copies / mL). A negative result must be combined with clinical  observations, patient history, and epidemiological information. If result is POSITIVE SARS-CoV-2 target nucleic acids are DETECTED. The SARS-CoV-2 RNA is generally detectable in upper and lower  respiratory specimens dur ing the acute phase of infection.  Positive  results are indicative of active infection with SARS-CoV-2.  Clinical  correlation with patient history and other diagnostic information is  necessary to determine patient infection status.  Positive results do  not rule out bacterial infection or co-infection with other viruses. If result is  PRESUMPTIVE POSTIVE SARS-CoV-2 nucleic acids MAY BE PRESENT.   A presumptive positive result was obtained on the submitted specimen  and confirmed on repeat testing.  While 2019 novel coronavirus  (SARS-CoV-2) nucleic acids may be present in the submitted sample  additional confirmatory testing may be necessary for epidemiological  and / or  clinical management purposes  to differentiate between  SARS-CoV-2 and other Sarbecovirus currently known to infect humans.  If clinically indicated additional testing with an alternate test  methodology [phone]) is advised. The SARS-CoV-2 RNA is generally  detectable in upper and lower respiratory sp ecimens during the acute  phase of infection. The expected result is Negative. Fact Sheet for Patients:  StrictlyIdeas.no Fact Sheet for Healthcare Providers: BankingDealers.co.za This test is not yet approved or cleared by the [place] FDA and has been authorized for detection and/or diagnosis of SARS-CoV-2 by FDA under an Emergency Use Authorization (EUA).  This EUA will remain in effect (meaning this test can be used) for the duration of the COVID-19 declaration under Section 564(b)(1) of the Act, 21 U.S.C. section 360bbb-3(b)(1), unless the authorization is terminated or revoked sooner. Performed at [facility], [place] [address], [place], [place] [address]   MRSA PCR Screening     Status: None   Collection Time: 08/02/18  3:11 AM   Specimen: Nasal Mucosa; Nasopharyngeal  Result Value Ref Range Status   MRSA by PCR NEGATIVE NEGATIVE Final    Comment:        The GeneXpert MRSA Assay (FDA approved for NASAL specimens only), is one component of a comprehensive MRSA colonization surveillance program. It is not intended to diagnose MRSA infection nor to guide or monitor treatment for MRSA infections. Performed at [facility], [place] [address],  [place], [place] [address]   Blood Culture (routine x 2)     Status: None (Preliminary result)   Collection Time: 08/02/18 11:24 AM   Specimen: BLOOD LEFT ARM  Result Value Ref Range Status   Specimen Description   Final    BLOOD LEFT ARM Performed at [facility], [place] [address] [place], [place] [address]    Special Requests   Final    BOTTLES DRAWN AEROBIC AND ANAEROBIC Blood Culture adequate volume Performed at [place] [address], [place], [place] [address]    Culture PENDING  Incomplete   Report Status PENDING  Incomplete    Michel Bickers, MD [facility] [phone] pager   [phone] cell 08/02/2018, 3:02 PM

## 2018-08-03 DIAGNOSIS — R0602 Shortness of breath: Secondary | ICD-10-CM

## 2018-08-03 DIAGNOSIS — J9601 Acute respiratory failure with hypoxia: Secondary | ICD-10-CM | POA: Diagnosis present

## 2018-08-03 DIAGNOSIS — D759 Disease of blood and blood-forming organs, unspecified: Secondary | ICD-10-CM

## 2018-08-03 DIAGNOSIS — R071 Chest pain on breathing: Secondary | ICD-10-CM

## 2018-08-03 DIAGNOSIS — R079 Chest pain, unspecified: Secondary | ICD-10-CM | POA: Diagnosis present

## 2018-08-03 DIAGNOSIS — R05 Cough: Secondary | ICD-10-CM

## 2018-08-03 LAB — RESPIRATORY PANEL BY PCR
Adenovirus: NOT DETECTED
Bordetella pertussis: NOT DETECTED
Chlamydophila pneumoniae: NOT DETECTED
Coronavirus 229E: DETECTED — AB
Coronavirus HKU1: NOT DETECTED
Coronavirus NL63: NOT DETECTED
Coronavirus OC43: NOT DETECTED
Influenza A: NOT DETECTED
Influenza B: NOT DETECTED
Metapneumovirus: NOT DETECTED
Mycoplasma pneumoniae: NOT DETECTED
Parainfluenza Virus 1: NOT DETECTED
Parainfluenza Virus 2: NOT DETECTED
Parainfluenza Virus 3: NOT DETECTED
Parainfluenza Virus 4: NOT DETECTED
Respiratory Syncytial Virus: NOT DETECTED
Rhinovirus / Enterovirus: NOT DETECTED

## 2018-08-03 LAB — BASIC METABOLIC PANEL
Anion gap: 10 (ref 5–15)
BUN: 17 mg/dL (ref 6–20)
CO2: 14 mmol/L — ABNORMAL LOW (ref 22–32)
Calcium: 6.9 mg/dL — ABNORMAL LOW (ref 8.9–10.3)
Chloride: 107 mmol/L (ref 98–111)
Creatinine, Ser: 1.03 mg/dL — ABNORMAL HIGH (ref 0.44–1.00)
GFR calc Af Amer: >60 mL/min (ref >60)
GFR calc non Af Amer: >60 mL/min (ref >60)
Glucose, Bld: 214 mg/dL — ABNORMAL HIGH (ref 70–99)
Potassium: 4.6 mmol/L (ref 3.5–5.1)
Sodium: 131 mmol/L — ABNORMAL LOW (ref 135–145)

## 2018-08-03 LAB — BPAM RBC
Blood Product Expiration Date: 202007212359
Blood Product Expiration Date: 202007242359
ISSUE DATE / TIME: 202006260154
ISSUE DATE / TIME: 202006260529
Unit Type and Rh: 5100
Unit Type and Rh: 5100

## 2018-08-03 LAB — CBC WITH DIFFERENTIAL/PLATELET
Band Neutrophils: 0 %
Basophils Absolute: 0 10*3/uL (ref 0.0–0.1)
Basophils Relative: 0 %
Blasts: 0 %
Eosinophils Absolute: 0 10*3/uL (ref 0.0–0.5)
Eosinophils Relative: 0 %
HCT: 27.6 % — ABNORMAL LOW (ref 36.0–46.0)
Hemoglobin: 8.4 g/dL — ABNORMAL LOW (ref 12.0–15.0)
Lymphocytes Relative: 5 %
Lymphs Abs: 0.8 10*3/uL (ref 0.7–4.0)
MCH: 29.6 pg (ref 26.0–34.0)
MCHC: 30.4 g/dL (ref 30.0–36.0)
MCV: 97.2 fL (ref 80.0–100.0)
Metamyelocytes Relative: 3 %
Monocytes Absolute: 1.2 10*3/uL — ABNORMAL HIGH (ref 0.1–1.0)
Monocytes Relative: 8 %
Myelocytes: 1 %
Neutro Abs: 13.3 10*3/uL — ABNORMAL HIGH (ref 1.7–7.7)
Neutrophils Relative %: 83 %
Other: 0 %
Platelets: 16 10*3/uL — CL (ref 150–400)
Promyelocytes Relative: 0 %
RBC: 2.84 MIL/uL — ABNORMAL LOW (ref 3.87–5.11)
RDW: 23.8 % — ABNORMAL HIGH (ref 11.5–15.5)
WBC: 15.3 10*3/uL — ABNORMAL HIGH (ref 4.0–10.5)
nRBC: 0 /100 WBC
nRBC: 0.9 % — ABNORMAL HIGH (ref 0.0–0.2)

## 2018-08-03 LAB — TYPE AND SCREEN
ABO/RH(D): O POS
Antibody Screen: NEGATIVE
Unit division: 0
Unit division: 0

## 2018-08-03 LAB — STREP PNEUMONIAE URINARY ANTIGEN: Strep Pneumo Urinary Antigen: NEGATIVE

## 2018-08-03 LAB — URINALYSIS, ROUTINE W REFLEX MICROSCOPIC
Glucose, UA: NEGATIVE mg/dL
Ketones, ur: NEGATIVE mg/dL
Leukocytes,Ua: NEGATIVE
Nitrite: NEGATIVE
Specific Gravity, Urine: 1.015 (ref 1.005–1.030)
pH: 6.5 (ref 5.0–8.0)

## 2018-08-03 LAB — PREPARE PLATELET PHERESIS: Unit division: 0

## 2018-08-03 LAB — CULTURE, FUNGUS WITHOUT SMEAR

## 2018-08-03 LAB — LACTIC ACID, PLASMA
Lactic Acid, Venous: 1.7 mmol/L (ref 0.5–1.9)
Lactic Acid, Venous: 4.4 mmol/L (ref 0.5–1.9)

## 2018-08-03 LAB — URINALYSIS, MICROSCOPIC (REFLEX): WBC, UA: NONE SEEN WBC/hpf (ref 0–5)

## 2018-08-03 LAB — BPAM PLATELET PHERESIS
Blood Product Expiration Date: 202006282359
ISSUE DATE / TIME: 202006261514
Unit Type and Rh: 6200

## 2018-08-03 LAB — HAPTOGLOBIN: Haptoglobin: 370 mg/dL — ABNORMAL HIGH (ref 33–278)

## 2018-08-03 LAB — GLUCOSE, CAPILLARY
Glucose-Capillary: 203 mg/dL — ABNORMAL HIGH (ref 70–99)
Glucose-Capillary: 168 mg/dL — ABNORMAL HIGH (ref 70–99)
Glucose-Capillary: 160 mg/dL — ABNORMAL HIGH (ref 70–99)
Glucose-Capillary: 135 mg/dL — ABNORMAL HIGH (ref 70–99)

## 2018-08-03 LAB — HEPARIN INDUCED PLATELET AB (HIT ANTIBODY): Heparin Induced Plt Ab: 0.195 OD (ref 0.000–0.400)

## 2018-08-03 LAB — MAGNESIUM: Magnesium: 1.9 mg/dL (ref 1.7–2.4)

## 2018-08-03 LAB — NOVEL CORONAVIRUS, NAA: SARS-CoV-2, NAA: NOT DETECTED

## 2018-08-03 MED ORDER — SODIUM CHLORIDE 0.9 % IV BOLUS
500.0000 mL | Freq: Once | INTRAVENOUS | Status: AC
  Administered 2018-08-03: 500 mL via INTRAVENOUS

## 2018-08-03 MED ORDER — PANTOPRAZOLE SODIUM 40 MG PO TBEC
40.0000 mg | DELAYED_RELEASE_TABLET | Freq: Two times a day (BID) | ORAL | Status: DC
  Administered 2018-08-03 – 2018-08-22 (×39): 40 mg via ORAL
  Filled 2018-08-03 (×40): qty 1

## 2018-08-03 NOTE — Progress Notes (Signed)
INFECTIOUS DISEASE PROGRESS NOTE  ID: Krystal Short is a 33 y.o. female with  Active Problems:   Hyponatremia   AIDS (acquired immune deficiency syndrome) (HCC)   Molluscum contagiosum   Symptomatic anemia   Thrombocytopenia (HCC)   Pulmonary infiltrates   Fever   Sepsis ([place])   Acute respiratory failure with hypoxemia (HCC)   Chest pain  Subjective: Adm 6-26 with cough, sob and fever.  Continued SOB, cough better (she attributes to mucinex)  Abtx:  Anti-infectives (From admission, onward)   Start     Dose/Rate Route Frequency Ordered Stop   08/03/18 0300  vancomycin (VANCOCIN) IVPB 1000 mg/200 mL premix  Status:  Discontinued     1,000 mg 200 mL/hr over 60 Minutes Intravenous Daily 08/02/18 0202 08/02/18 1527   08/02/18 1630  primaquine tablet 30 mg     30 mg Oral Daily 08/02/18 1527     08/02/18 1530  clindamycin (CLEOCIN) IVPB 900 mg     900 mg 100 mL/hr over 30 Minutes Intravenous Every 8 hours 08/02/18 1527     08/02/18 1000  ceFEPIme (MAXIPIME) 2 g in sodium chloride 0.9 % 100 mL IVPB     2 g 200 mL/hr over 30 Minutes Intravenous Every 12 hours 08/02/18 0202     08/02/18 1000  azithromycin (ZITHROMAX) tablet 250 mg    Note to Pharmacy: Zpack taper as directed     250 mg Oral Daily 08/02/18 0302 08/03/18 1018   08/02/18 1000  bictegravir-emtricitabine-tenofovir AF (BIKTARVY) 50-200-25 MG per tablet 1 tablet     1 tablet Oral Daily 08/02/18 0302     08/01/18 2300  vancomycin (VANCOCIN) 1,250 mg in sodium chloride 0.9 % 250 mL IVPB     1,250 mg 166.7 mL/hr over 90 Minutes Intravenous  Once 08/01/18 2242 08/02/18 0309   08/01/18 2245  ceFEPIme (MAXIPIME) 2 g in sodium chloride 0.9 % 100 mL IVPB     2 g 200 mL/hr over 30 Minutes Intravenous NOW 08/01/18 2241 08/02/18 0058      Medications:  Scheduled:  B-complex with vitamin C  1 tablet Oral Daily   bictegravir-emtricitabine-tenofovir AF  1 tablet Oral Daily   folic acid  2 mg Oral Daily   guaiFENesin   600 mg Oral BID   insulin aspart  0-9 Units Subcutaneous TID WC   mouth rinse  15 mL Mouth Rinse BID   methylPREDNISolone (SOLU-MEDROL) injection  40 mg Intravenous Q12H    morphine injection  1 mg Intravenous Once   pantoprazole  40 mg Oral BID   primaquine  30 mg Oral Daily    Objective: Vital signs in last 24 hours: Temp:  [97.9 F (36.6 C)-99.6 F (37.6 C)] 98.4 F (36.9 C) (06/27 1647) Pulse Rate:  [29-132] 116 (06/27 1100) Resp:  [18-39] 33 (06/27 1300) BP: (95-115)/(49-88) 100/64 (06/27 1300) SpO2:  [62 %-100 %] 97 % (06/27 1115)   General appearance: alert, cooperative and no distress Resp: wheezes anterior - bilateral, anteriorly Cardio: regular rate and rhythm GI: normal findings: bowel sounds normal and soft, non-tender Extremities: edema none  Lab Results Recent Labs    08/02/18 1000 08/02/18 1530 08/03/18 0819  WBC 18.0*  --  15.3*  HGB 9.3*  --  8.4*  HCT 29.0*  --  27.6*  NA  --  131* 131*  K  --  4.7 4.6  CL  --  104 107  CO2  --  15* 14*  BUN  --  17 17  CREATININE  --  0.98 1.03*   Liver Panel Recent Labs    08/01/18 2227 08/02/18 1530  PROT 6.9 6.3*  ALBUMIN 2.0* 1.9*  AST 44* 34  ALT 21 18  ALKPHOS 122 104  BILITOT 1.9* 1.1   Sedimentation Rate Recent Labs    08/02/18 1124  ESRSEDRATE 140*   C-Reactive Protein No results for input(s): CRP in the last 72 hours.  Microbiology: Recent Results (from the past 240 hour(s))  Novel Coronavirus, NAA ([facility])     Status: None   Collection Time: 07/30/18 10:46 AM  Result Value Ref Range Status   SARS-CoV-2, NAA Not Detected Not Detected Final    Comment: Testing was performed using the cobas(R) SARS-CoV-2 test. This test was developed and its performance characteristics determined by [employer]. This test has not been FDA cleared or approved. This test has been authorized by FDA under an Emergency Use Authorization (EUA). This test is only authorized for the duration  of time the declaration that circumstances exist justifying the authorization of the emergency use of in vitro diagnostic tests for detection of SARS-CoV-2 virus and/or diagnosis of COVID-19 infection under section 564(b)(1) of the Act, 21 U.S.C. 628BTD-1(V)(6), unless the authorization is terminated or revoked sooner. When diagnostic testing is negative, the possibility of a false negative result should be considered in the context of a patient's recent exposures and the presence of clinical signs and symptoms consistent with COVID-19. An individual without symptoms of COVID-19 and who is not shedding SARS-CoV-2 virus would expect to have a negati ve (not detected) result in this assay.   Blood Culture (routine x 2)     Status: None (Preliminary result)   Collection Time: 08/01/18 10:27 PM   Specimen: BLOOD  Result Value Ref Range Status   Specimen Description   Final    BLOOD SITE NOT SPECIFIED Performed at [facility], [address], [place], [place] [address]    Special Requests   Final    BOTTLES DRAWN AEROBIC AND ANAEROBIC Blood Culture adequate volume Performed at [facility] [address] [place], [place] [address]    Culture   Final    NO GROWTH 1 DAY Performed at [facility], [place] [address] [place], [place] [address]    Report Status PENDING  Incomplete  SARS Coronavirus 2 (CEPHEID- Performed in [facility] lab), Hosp Order     Status: None   Collection Time: 08/01/18 10:28 PM   Specimen: Nasopharyngeal Swab  Result Value Ref Range Status   SARS Coronavirus 2 NEGATIVE NEGATIVE Final    Comment: (NOTE) If result is NEGATIVE SARS-CoV-2 target nucleic acids are NOT DETECTED. The SARS-CoV-2 RNA is generally detectable in upper and lower  respiratory specimens during the acute phase of infection. The lowest  concentration of SARS-CoV-2 viral copies this assay can detect is 250  copies / mL. A negative result does not  preclude SARS-CoV-2 infection  and should not be used as the sole basis for treatment or other  patient management decisions.  A negative result may occur with  improper specimen collection / handling, submission of specimen other  than nasopharyngeal swab, presence of viral mutation(s) within the  areas targeted by this assay, and inadequate number of viral copies  (<250 copies / mL). A negative result must be combined with clinical  observations, patient history, and epidemiological information. If result is POSITIVE SARS-CoV-2 target nucleic acids are DETECTED. The SARS-CoV-2  RNA is generally detectable in upper and lower  respiratory specimens dur ing the acute phase of infection.  Positive  results are indicative of active infection with SARS-CoV-2.  Clinical  correlation with patient history and other diagnostic information is  necessary to determine patient infection status.  Positive results do  not rule out bacterial infection or co-infection with other viruses. If result is PRESUMPTIVE POSTIVE SARS-CoV-2 nucleic acids MAY BE PRESENT.   A presumptive positive result was obtained on the submitted specimen  and confirmed on repeat testing.  While 2019 novel coronavirus  (SARS-CoV-2) nucleic acids may be present in the submitted sample  additional confirmatory testing may be necessary for epidemiological  and / or clinical management purposes  to differentiate between  SARS-CoV-2 and other Sarbecovirus currently known to infect humans.  If clinically indicated additional testing with an alternate test  methodology [phone]) is advised. The SARS-CoV-2 RNA is generally  detectable in upper and lower respiratory sp ecimens during the acute  phase of infection. The expected result is Negative. Fact Sheet for Patients:  StrictlyIdeas.no Fact Sheet for Healthcare Providers: BankingDealers.co.za This test is not yet approved or cleared by  the [place] FDA and has been authorized for detection and/or diagnosis of SARS-CoV-2 by FDA under an Emergency Use Authorization (EUA).  This EUA will remain in effect (meaning this test can be used) for the duration of the COVID-19 declaration under Section 564(b)(1) of the Act, 21 U.S.C. section 360bbb-3(b)(1), unless the authorization is terminated or revoked sooner. Performed at [facility], [place] [address] [place], [place] [address]   MRSA PCR Screening     Status: None   Collection Time: 08/02/18  3:11 AM   Specimen: Nasal Mucosa; Nasopharyngeal  Result Value Ref Range Status   MRSA by PCR NEGATIVE NEGATIVE Final    Comment:        The GeneXpert MRSA Assay (FDA approved for NASAL specimens only), is one component of a comprehensive MRSA colonization surveillance program. It is not intended to diagnose MRSA infection nor to guide or monitor treatment for MRSA infections. Performed at [facility], [place] [address], [place], [place] [address]   Blood Culture (routine x 2)     Status: None (Preliminary result)   Collection Time: 08/02/18 11:24 AM   Specimen: BLOOD LEFT ARM  Result Value Ref Range Status   Specimen Description   Final    BLOOD LEFT ARM Performed at [facility], [place] [address], [place], [place] [address]    Special Requests   Final    BOTTLES DRAWN AEROBIC AND ANAEROBIC Blood Culture adequate volume Performed at [facility] [address], [place], [place] [address]    Culture   Final    NO GROWTH 1 DAY Performed at [facility], [place] [address], [place], [place] [address]    Report Status PENDING  Incomplete  Respiratory Panel by PCR     Status: Abnormal   Collection Time: 08/02/18 12:19 PM   Specimen: Nasopharyngeal Swab; Respiratory  Result Value Ref Range Status   Adenovirus NOT DETECTED NOT DETECTED Final   Coronavirus 229E DETECTED (A) NOT DETECTED Final    Comment:  (NOTE) The Coronavirus on the Respiratory Panel, DOES NOT test for the novel  Coronavirus (2019 nCoV)    Coronavirus HKU1 NOT DETECTED NOT DETECTED Final   Coronavirus NL63 NOT DETECTED NOT DETECTED Final   Coronavirus OC43 NOT DETECTED NOT DETECTED Final  Metapneumovirus NOT DETECTED NOT DETECTED Final   Rhinovirus / Enterovirus NOT DETECTED NOT DETECTED Final   Influenza A NOT DETECTED NOT DETECTED Final   Influenza B NOT DETECTED NOT DETECTED Final   Parainfluenza Virus 1 NOT DETECTED NOT DETECTED Final   Parainfluenza Virus 2 NOT DETECTED NOT DETECTED Final   Parainfluenza Virus 3 NOT DETECTED NOT DETECTED Final   Parainfluenza Virus 4 NOT DETECTED NOT DETECTED Final   Respiratory Syncytial Virus NOT DETECTED NOT DETECTED Final   Bordetella pertussis NOT DETECTED NOT DETECTED Final   Chlamydophila pneumoniae NOT DETECTED NOT DETECTED Final   Mycoplasma pneumoniae NOT DETECTED NOT DETECTED Final    Comment: Performed at [facility], [place] [address] [place], [place] [address]    Studies/Results: Dg Chest Port 1 View  Result Date: 08/02/2018 CLINICAL DATA:  Pt reported center chest pain and SOB that started a few minutes ago. EXAM: PORTABLE CHEST - 1 VIEW COMPARISON:  none FINDINGS: Worsening bilateral reticulonodular ground-glass opacities. Heart size and mediastinal contours are within normal limits. No effusion. No pneumothorax. Visualized bones unremarkable. IMPRESSION: Continued worsening of bilateral reticulonodular ground-glass opacities. Electronically Signed   By: Lucrezia Europe M.D.   On: 08/02/2018 15:51   Dg Chest Port 1 View  Result Date: 08/02/2018 CLINICAL DATA:  Fever and hypoxia EXAM: PORTABLE CHEST 1 VIEW COMPARISON:  07/12/2018, 06/18/2018, CT chest 07/05/2018 FINDINGS: Bilateral reticular and ground-glass opacity, increased compared to prior. Normal heart size. No pneumothorax. IMPRESSION: Interval worsening of bilateral reticular and ground-glass infiltrates  since radiograph 07/12/2018. Electronically Signed   By: Donavan Foil M.D.   On: 08/02/2018 00:03   Ct Angio Chest/abd/pel For Dissection W And/or W/wo  Result Date: 08/02/2018 CLINICAL DATA:  Back pain EXAM: CT ANGIOGRAPHY CHEST, ABDOMEN AND PELVIS TECHNIQUE: Multidetector CT imaging through the chest, abdomen and pelvis was performed using the standard protocol during bolus administration of intravenous contrast. Multiplanar reconstructed images and MIPs were obtained and reviewed to evaluate the vascular anatomy. CONTRAST:  151mL OMNIPAQUE IOHEXOL 350 MG/ML SOLN COMPARISON:  CT chest dated Jul 04, 2018 FINDINGS: CTA CHEST FINDINGS Cardiovascular: Evaluation for pulmonary emboli is limited by respiratory motion artifact. Given this limitation, there is no large centrally located pulmonary embolus. There is no definite dissection. The heart size is normal. There is no large pericardial effusion. The intracardiac blood pool is hypodense relative to the adjacent myocardium consistent with anemia. Mediastinum/Nodes: There are enlarged mediastinal and hilar lymph nodes. For example there is a right paratracheal lymph node measuring approximately 1.4 cm. The thyroid gland is unremarkable. There is mild supraclavicular adenopathy. Lungs/Pleura: There are diffuse ground-glass airspace opacities bilaterally with prominent interlobular septal thickening. There are trace bilateral pleural effusions. There is no pneumothorax. The trachea is unremarkable. Musculoskeletal: No chest wall abnormality. No acute or significant osseous findings. Review of the MIP images confirms the above findings. CTA ABDOMEN AND PELVIS FINDINGS VASCULAR Aorta: Normal caliber aorta without aneurysm, dissection, vasculitis or significant stenosis. Celiac: Patent without evidence of aneurysm, dissection, vasculitis or significant stenosis. SMA: Patent without evidence of aneurysm, dissection, vasculitis or significant stenosis. Renals: Both  renal arteries are patent without evidence of aneurysm, dissection, vasculitis, fibromuscular dysplasia or significant stenosis. IMA: Patent without evidence of aneurysm, dissection, vasculitis or significant stenosis. Inflow: Patent without evidence of aneurysm, dissection, vasculitis or significant stenosis. Veins: No obvious venous abnormality within the limitations of this arterial phase study. Review of the MIP images confirms the above findings. NON-VASCULAR Hepatobiliary: There is hepatic  steatosis. The gallbladder is unremarkable. Pancreas: Unremarkable. No pancreatic ductal dilatation or surrounding inflammatory changes. Spleen: The spleen is significantly enlarged. Adrenals/Urinary Tract: Adrenal glands are unremarkable. Kidneys are normal, without renal calculi, focal lesion, or hydronephrosis. Bladder is unremarkable. Stomach/Bowel: Stomach is within normal limits. Appendix appears normal. No evidence of bowel wall thickening, distention, or inflammatory changes. Lymphatic: There is scattered prominent retroperitoneal and mesenteric lymph nodes. There are prominent inguinal lymph nodes. Reproductive: Uterus and bilateral adnexa are unremarkable. Other: There is mild body wall edema. There is a small volume of free fluid in the pelvis. Musculoskeletal: No acute or significant osseous findings. Review of the MIP images confirms the above findings. IMPRESSION: 1. No evidence of a dissection. 2. Evaluation for pulmonary emboli is limited by motion artifact. Given this limitation there is no large centrally located pulmonary embolus. 3. Again noted are diffuse bilateral ground-glass airspace opacities most likely representing an atypical infectious process. Sarcoidosis can have a similar appearance in the appropriate clinical setting. 4. Trace bilateral pleural effusions. 5. Scattered adenopathy throughout the chest, abdomen, and pelvis, likely related to the patient's underlying history of HIV. 6.  Splenomegaly. 7. Hepatic steatosis. 8. Small volume free fluid in the pelvis, likely physiologic. Electronically Signed   By: Constance Holster M.D.   On: 08/02/2018 23:15   HIV 1 RNA Quant (copies/mL)  Date Value  07/06/2018 <20  06/20/2018 <20 DETECTED (A)  04/02/2018 <20 DETECTED (A)   CD4 T Cell Abs (/uL)  Date Value  06/20/2018 <35 (L)  04/02/2018 10 (L)  12/17/2017 50 (L)     Assessment/Plan: AIDS (dx 06-2018) Suspected PCP [facility]  Total days of antibiotics: 1 clinda/primaquine/cefepime/azithro biktarvy  Will check fungal panel (this would be consistent with histo although [place] is not classically endemic for histo) Will check ACE level (although she does not have granuloma on bx or CXR, her steroid responsiveness could suggest this)  Await her parvo serologies She does have one of the "common cold" viruses (corona 229), it would not give her the recurrent episodes she has had.          Bobby Rumpf MD, FACP Infectious Diseases (pager) [phone] [email or web address] 08/03/2018, 4:49 PM  LOS: 1 day

## 2018-08-03 NOTE — Progress Notes (Signed)
CRITICAL VALUE ALERT  Critical Value:  Lactic 4.4   Date & Time Notied:  08/03/2018 @  46  Provider Notified: Dr. Melvyn Novas on unit notified at [phone]  Orders Received/Actions taken: no new orders at this time

## 2018-08-03 NOTE — Progress Notes (Signed)
PROGRESS NOTE  Krystal Short VQX:[id number] DOB: 1985/03/16 DOA: 08/01/2018 PCP: Lajean Manes, MD  HPI/Recap of past 24 hours:  tmap 99.6, still has tachycardia and tachypnea Lactic acid normalized, hyponatremia impvoed Less Dry cough, being sore from the cough Overall feeling a little better    Assessment/Plan: Active Problems:   Hyponatremia   AIDS (acquired immune deficiency syndrome) (HCC)   Molluscum contagiosum   Symptomatic anemia   Thrombocytopenia (HCC)   Pulmonary infiltrates   Fever   Sepsis (HCC)  Acute hypoxic respiratory failure with fever , bilateral lung infiltrate, sepsis on presentation -SARS COV 2 screening negative, mrsa screening negative  -Respiratory viral panel + common cold virus (coronavirus 229E) -blood culture no growth so far -bordetella pertussis pcr in process -parvo b19 igg/igm in process -she recently underwent bronchoscopy on 6/5, biopsy" SCANT LUNG PARENCHYMA WITH FIBROSIS AND REACTIVE CHANGES. - NO GRANULOMATA IDENTIFIED." -concerning for pcp , sputum pcp smear in process, now on primaquine and clindamycin in addition to cefepime, zithromax, steroids,  ID/pulm/critical care consulted  Patient c/o 10/10 chest pain /back pain intermittent,  High sensitivity troponin unremarkable, CTA chest/ab negative for  dissection , no large PE Could it be acid reflux with esophageal spasm , she is on ppi twice a day Prn morphin  Hyponatremia: from dehydration vs SIADH, she already received multiple fluids bolus, urine sodium /osmo test will not be reliable Improving  Acute normocytic anemia/acute thrombocytopenia: S/p prbc transfusionx2 , hgb improved from 6 to 9, plt 17, transfuse plt in the setting of sepsis Hematology consulted, will follow recommendation  Elevated blood glucose, appear has been chronic No prior diagnosis of diabetes,  a1c 4.7, could be from stress, steroids  HIV/AIDS CD4=10 06/05/2017 On biktarvy ID following   Code  Status: full  Family Communication: patient   Disposition Plan: critically ill, remain in ICU/stepdown   Consultants:  Hematology  ID  Pulmonology/critical care  Procedures:  prbc transfusion  plt transfusion   Antibiotics:  As above   Objective: BP 102/87   Pulse (!) 127   Temp 98.5 F (36.9 C) (Oral)   Resp (!) 33   Ht 5\' 1"  (1.549 m)   Wt 56.7 kg   SpO2 96%   BMI 23.62 kg/m   Intake/Output Summary (Last 24 hours) at 08/03/2018 0836 Last data filed at 08/03/2018 0300 Gross per 24 hour  Intake 3676.11 ml  Output 750 ml  Net 2926.11 ml   Filed Weights   08/01/18 2226 08/02/18 0103  Weight: 56.7 kg 56.7 kg    Exam: Patient is examined daily including today on 08/03/2018, exams remain the same as of yesterday except that has changed    General:  Weak, tachypnea, alert, oriented x3, molluscum skin lesions on face  Cardiovascular: sinus tachycardia  Respiratory: slightly course, no wheezing, no rales, no rhonchi   Abdomen: Soft/ND/NT, positive BS  Musculoskeletal: No Edema  Neuro: alert, oriented   Data Reviewed: Basic Metabolic Panel: Recent Labs  Lab 08/01/18 2227 08/02/18 1530  NA 126* 131*  K 4.7 4.7  CL 93* 104  CO2 20* 15*  GLUCOSE 107* 245*  BUN 25* 17  CREATININE 1.23* 0.98  CALCIUM 7.5* 6.9*   Liver Function Tests: Recent Labs  Lab 08/01/18 2227 08/02/18 1530  AST 44* 34  ALT 21 18  ALKPHOS 122 104  BILITOT 1.9* 1.1  PROT 6.9 6.3*  ALBUMIN 2.0* 1.9*   No results for input(s): LIPASE, AMYLASE in the last 168 hours. No results for  input(s): AMMONIA in the last 168 hours. CBC: Recent Labs  Lab 08/01/18 2227 08/02/18 1000 08/02/18 2124  WBC 16.0* 18.0*  --   NEUTROABS 11.8*  --   --   HGB 6.2* 9.3*  --   HCT 19.5* 29.0*  --   MCV 104.3* 95.7  --   PLT 22* 17* 28*   Cardiac Enzymes:   No results for input(s): CKTOTAL, CKMB, CKMBINDEX, TROPONINI in the last 168 hours. BNP (last 3 results) Recent Labs     03/08/18 2129 08/02/18 1520  BNP 8.8 144.0*    ProBNP (last 3 results) No results for input(s): PROBNP in the last 8760 hours.  CBG: Recent Labs  Lab 08/02/18 2334 08/03/18 0748  GLUCAP 111* 203*    Recent Results (from the past 240 hour(s))  Blood Culture (routine x 2)     Status: None (Preliminary result)   Collection Time: 08/01/18 10:27 PM   Specimen: BLOOD  Result Value Ref Range Status   Specimen Description   Final    BLOOD SITE NOT SPECIFIED Performed at [facility] [address] [place], [place] [address]    Special Requests   Final    BOTTLES DRAWN AEROBIC AND ANAEROBIC Blood Culture adequate volume Performed at [facility] [address], [place], [place] [address]    Culture   Final    NO GROWTH 1 DAY Performed at [facility], [place] [address] [place], [place] [address]    Report Status PENDING  Incomplete  SARS Coronavirus 2 (CEPHEID- Performed in [facility] lab), Hosp Order     Status: None   Collection Time: 08/01/18 10:28 PM   Specimen: Nasopharyngeal Swab  Result Value Ref Range Status   SARS Coronavirus 2 NEGATIVE NEGATIVE Final    Comment: (NOTE) If result is NEGATIVE SARS-CoV-2 target nucleic acids are NOT DETECTED. The SARS-CoV-2 RNA is generally detectable in upper and lower  respiratory specimens during the acute phase of infection. The lowest  concentration of SARS-CoV-2 viral copies this assay can detect is 250  copies / mL. A negative result does not preclude SARS-CoV-2 infection  and should not be used as the sole basis for treatment or other  patient management decisions.  A negative result may occur with  improper specimen collection / handling, submission of specimen other  than nasopharyngeal swab, presence of viral mutation(s) within the  areas targeted by this assay, and inadequate number of viral copies  (<250 copies / mL). A negative result must be combined with clinical   observations, patient history, and epidemiological information. If result is POSITIVE SARS-CoV-2 target nucleic acids are DETECTED. The SARS-CoV-2 RNA is generally detectable in upper and lower  respiratory specimens dur ing the acute phase of infection.  Positive  results are indicative of active infection with SARS-CoV-2.  Clinical  correlation with patient history and other diagnostic information is  necessary to determine patient infection status.  Positive results do  not rule out bacterial infection or co-infection with other viruses. If result is PRESUMPTIVE POSTIVE SARS-CoV-2 nucleic acids MAY BE PRESENT.   A presumptive positive result was obtained on the submitted specimen  and confirmed on repeat testing.  While 2019 novel coronavirus  (SARS-CoV-2) nucleic acids may be present in the submitted sample  additional confirmatory testing may be necessary for epidemiological  and / or clinical management purposes  to differentiate between  SARS-CoV-2 and other Sarbecovirus currently known to infect humans.  If  clinically indicated additional testing with an alternate test  methodology [phone]) is advised. The SARS-CoV-2 RNA is generally  detectable in upper and lower respiratory sp ecimens during the acute  phase of infection. The expected result is Negative. Fact Sheet for Patients:  StrictlyIdeas.no Fact Sheet for Healthcare Providers: BankingDealers.co.za This test is not yet approved or cleared by the [place] FDA and has been authorized for detection and/or diagnosis of SARS-CoV-2 by FDA under an Emergency Use Authorization (EUA).  This EUA will remain in effect (meaning this test can be used) for the duration of the COVID-19 declaration under Section 564(b)(1) of the Act, 21 U.S.C. section 360bbb-3(b)(1), unless the authorization is terminated or revoked sooner. Performed at [facility], [place]  [address] [place], [place] [address]   MRSA PCR Screening     Status: None   Collection Time: 08/02/18  3:11 AM   Specimen: Nasal Mucosa; Nasopharyngeal  Result Value Ref Range Status   MRSA by PCR NEGATIVE NEGATIVE Final    Comment:        The GeneXpert MRSA Assay (FDA approved for NASAL specimens only), is one component of a comprehensive MRSA colonization surveillance program. It is not intended to diagnose MRSA infection nor to guide or monitor treatment for MRSA infections. Performed at [facility], [place] [address] [place], [place] [address]   Blood Culture (routine x 2)     Status: None (Preliminary result)   Collection Time: 08/02/18 11:24 AM   Specimen: BLOOD LEFT ARM  Result Value Ref Range Status   Specimen Description   Final    BLOOD LEFT ARM Performed at [facility], [place] [address], [place], [place] [address]    Special Requests   Final    BOTTLES DRAWN AEROBIC AND ANAEROBIC Blood Culture adequate volume Performed at [facility] [address], [place], [place] [address]    Culture   Final    NO GROWTH < 24 HOURS Performed at [facility] [address], [place], [place] [address]    Report Status PENDING  Incomplete  Respiratory Panel by PCR     Status: Abnormal   Collection Time: 08/02/18 12:19 PM   Specimen: Nasopharyngeal Swab; Respiratory  Result Value Ref Range Status   Adenovirus NOT DETECTED NOT DETECTED Final   Coronavirus 229E DETECTED (A) NOT DETECTED Final    Comment: (NOTE) The Coronavirus on the Respiratory Panel, DOES NOT test for the novel  Coronavirus (2019 nCoV)    Coronavirus HKU1 NOT DETECTED NOT DETECTED Final   Coronavirus NL63 NOT DETECTED NOT DETECTED Final   Coronavirus OC43 NOT DETECTED NOT DETECTED Final   Metapneumovirus NOT DETECTED NOT DETECTED Final   Rhinovirus / Enterovirus NOT DETECTED NOT DETECTED Final   Influenza A NOT DETECTED NOT DETECTED Final   Influenza B  NOT DETECTED NOT DETECTED Final   Parainfluenza Virus 1 NOT DETECTED NOT DETECTED Final   Parainfluenza Virus 2 NOT DETECTED NOT DETECTED Final   Parainfluenza Virus 3 NOT DETECTED NOT DETECTED Final   Parainfluenza Virus 4 NOT DETECTED NOT DETECTED Final   Respiratory Syncytial Virus NOT DETECTED NOT DETECTED Final   Bordetella pertussis NOT DETECTED NOT DETECTED Final   Chlamydophila pneumoniae NOT DETECTED NOT DETECTED Final   Mycoplasma pneumoniae NOT DETECTED NOT DETECTED Final    Comment: Performed at [facility], [place] [address], [place], [place] [address]     Studies: Dg Chest [facility] [address]  Result Date: 08/02/2018 CLINICAL DATA:  Pt reported center chest pain and SOB that started a few minutes ago. EXAM: PORTABLE CHEST - 1 VIEW COMPARISON:  none FINDINGS: Worsening bilateral reticulonodular ground-glass opacities. Heart size and mediastinal contours are within normal limits. No effusion. No pneumothorax. Visualized bones unremarkable. IMPRESSION: Continued worsening of bilateral reticulonodular ground-glass opacities. Electronically Signed   By: Lucrezia Europe M.D.   On: 08/02/2018 15:51   Ct Angio Chest/abd/pel For Dissection W And/or W/wo  Result Date: 08/02/2018 CLINICAL DATA:  Back pain EXAM: CT ANGIOGRAPHY CHEST, ABDOMEN AND PELVIS TECHNIQUE: Multidetector CT imaging through the chest, abdomen and pelvis was performed using the standard protocol during bolus administration of intravenous contrast. Multiplanar reconstructed images and MIPs were obtained and reviewed to evaluate the vascular anatomy. CONTRAST:  172mL OMNIPAQUE IOHEXOL 350 MG/ML SOLN COMPARISON:  CT chest dated Jul 04, 2018 FINDINGS: CTA CHEST FINDINGS Cardiovascular: Evaluation for pulmonary emboli is limited by respiratory motion artifact. Given this limitation, there is no large centrally located pulmonary embolus. There is no definite dissection. The heart size is normal. There is no large pericardial effusion. The  intracardiac blood pool is hypodense relative to the adjacent myocardium consistent with anemia. Mediastinum/Nodes: There are enlarged mediastinal and hilar lymph nodes. For example there is a right paratracheal lymph node measuring approximately 1.4 cm. The thyroid gland is unremarkable. There is mild supraclavicular adenopathy. Lungs/Pleura: There are diffuse ground-glass airspace opacities bilaterally with prominent interlobular septal thickening. There are trace bilateral pleural effusions. There is no pneumothorax. The trachea is unremarkable. Musculoskeletal: No chest wall abnormality. No acute or significant osseous findings. Review of the MIP images confirms the above findings. CTA ABDOMEN AND PELVIS FINDINGS VASCULAR Aorta: Normal caliber aorta without aneurysm, dissection, vasculitis or significant stenosis. Celiac: Patent without evidence of aneurysm, dissection, vasculitis or significant stenosis. SMA: Patent without evidence of aneurysm, dissection, vasculitis or significant stenosis. Renals: Both renal arteries are patent without evidence of aneurysm, dissection, vasculitis, fibromuscular dysplasia or significant stenosis. IMA: Patent without evidence of aneurysm, dissection, vasculitis or significant stenosis. Inflow: Patent without evidence of aneurysm, dissection, vasculitis or significant stenosis. Veins: No obvious venous abnormality within the limitations of this arterial phase study. Review of the MIP images confirms the above findings. NON-VASCULAR Hepatobiliary: There is hepatic steatosis. The gallbladder is unremarkable. Pancreas: Unremarkable. No pancreatic ductal dilatation or surrounding inflammatory changes. Spleen: The spleen is significantly enlarged. Adrenals/Urinary Tract: Adrenal glands are unremarkable. Kidneys are normal, without renal calculi, focal lesion, or hydronephrosis. Bladder is unremarkable. Stomach/Bowel: Stomach is within normal limits. Appendix appears normal. No  evidence of bowel wall thickening, distention, or inflammatory changes. Lymphatic: There is scattered prominent retroperitoneal and mesenteric lymph nodes. There are prominent inguinal lymph nodes. Reproductive: Uterus and bilateral adnexa are unremarkable. Other: There is mild body wall edema. There is a small volume of free fluid in the pelvis. Musculoskeletal: No acute or significant osseous findings. Review of the MIP images confirms the above findings. IMPRESSION: 1. No evidence of a dissection. 2. Evaluation for pulmonary emboli is limited by motion artifact. Given this limitation there is no large centrally located pulmonary embolus. 3. Again noted are diffuse bilateral ground-glass airspace opacities most likely representing an atypical infectious process. Sarcoidosis can have a similar appearance in the appropriate clinical setting. 4. Trace bilateral pleural effusions. 5. Scattered adenopathy throughout the chest, abdomen, and pelvis, likely related to the patient's underlying history of HIV. 6. Splenomegaly. 7. Hepatic steatosis. 8. Small volume free fluid in the pelvis, likely  physiologic. Electronically Signed   By: Constance Holster M.D.   On: 08/02/2018 23:15    Scheduled Meds: . sodium chloride   Intravenous Once  . azithromycin  250 mg Oral Daily  . B-complex with vitamin C  1 tablet Oral Daily  . bictegravir-emtricitabine-tenofovir AF  1 tablet Oral Daily  . folic acid  2 mg Oral Daily  . guaiFENesin  600 mg Oral BID  . insulin aspart  0-9 Units Subcutaneous TID WC  . mouth rinse  15 mL Mouth Rinse BID  . methylPREDNISolone (SOLU-MEDROL) injection  40 mg Intravenous Q12H  .  morphine injection  1 mg Intravenous Once  . primaquine  30 mg Oral Daily  . sodium chloride (PF)        Continuous Infusions: . sodium chloride Stopped (08/03/18 0224)  . ceFEPime (MAXIPIME) IV Stopped (08/02/18 2321)  . clindamycin (CLEOCIN) IV Stopped (08/03/18 0557)  . lactated ringers     And  .  lactated ringers       Time spent: 59mins   I have personally reviewed and interpreted on  08/03/2018 daily labs, tele strips, imagings as discussed above under date review session and assessment and plans.  I reviewed all nursing notes, pharmacy notes, consultant notes,  vitals, pertinent old records  I have discussed plan of care as described above with RN , patient on 08/03/2018   Florencia Reasons MD, PhD  [facility] Pager [phone]. If 7PM-7AM, please contact night-coverage at [email or web address], password [facility] 08/03/2018, 8:36 AM  LOS: 1 day

## 2018-08-03 NOTE — Progress Notes (Addendum)
PULMONARY / CRITICAL CARE MEDICINE   NAME:  Krystal Short, MRN:  [id number], DOB:  1985/06/19, LOS: 1 ADMISSION DATE:  08/01/2018, CONSULTATION DATE:  08/02/2018 REFERRING MD:  [facility], CHIEF COMPLAINT:  Sob/cough  BRIEF HISTORY:    33 yobf quit smoking 12/2016 with HIV presented with new sob evolving to moslty dry cough since 07/27/18 assoc with fever, aches in setting of most recent admit 5-28 to 07/16/18 with bordetella cultures on fob and rx with zmax and "all better" = back to nl activity level s need for 02 or saba then gradually worse sob x 6/20 s rigors, pleuritic cp, n or v or diarrhea > televist with pulm NP 6/23 rec zpak/ pred > no better so to ER 6/25 with   cxr c/w new as dz bilaterally so pccm consulted pm 08/02/18.      HISTORY OF PRESENT ILLNESS     Prev w/u for sob 06/18/18 in pulmonary offfice  DOE (dyspnea on exertion) Onset April 2019 with dx of HIV and pulmonary infiltrates c/w PCP  - 06/18/2018   Walked RA  2 laps @  approx 230f each @ slow/mod pace  stopped due to  Light headed > sob, sats 100%   Serial chest x-rays were reviewed and are consistent with resolving opportunistic infection most likely PCP with a normal exam and the expectation that she will gradually improve.  Note however she is still anemic and her main complaint is more related to that (fatigue, lightheaded with activity).  rec finish rx per ID and f/u in 3 weeks > did not return to pulmonary office as admitted with flare of same symptoms   Denies vapes or IV/inhaled drug exp   Past Medical History:  Diagnosis Date  . Acute hyponatremia 06/03/2017  . Anemia   . CAP (community acquired pneumonia) 06/03/2017  . Community acquired pneumonia 06/05/2017  . Facial dermatitis 06/03/2017  . HIV (human immunodeficiency virus infection) ([facility]   . Pleurisy 06/03/2017  . Pneumonia 06/06/2017  . 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 Thrush of mouth and esophagus ([facility]   .  UTI (urinary tract infection)      SIGNIFICANT EVENTS:  Worse overnight with new ant midline cp worse with coughing fit > min slt ywllow    STUDIES:   CTa 6/26: neg pe/ bilateral GG as dz  wotj adenopathy/ splenomegaly   CULTURES:  COVID 19  6/25 neg  BC x 2  6/25  Resp panel 6/26  Bordetllea PCR  6/26 MRSA screen 6/26 neg  Resp PCR 6/26  Pos coronavirus 229E Pneumocystis 6/26    ANTIBIOTICS per ID :  Vanc 6/25 -d/c 6/26 so got one dose only  Bicegravir 6/25 Maxepime 6/25 zmax  6/26  Clinda 6/26 Primaquine 6/26   LINES/TUBES:     CONSULTANTS:   ID  6/26 PCCM 6/26    SUBJECTIVE:  More uncomfortable this am with mid line pain with coughing    CONSTITUTIONAL: BP 111/81   Pulse (!) 117   Temp 98.5 F (36.9 C) (Oral)   Resp (!) 32   Ht _0  (1.549 m)   Wt 56.7 kg   SpO2 96%   BMI 23.62 kg/m    On 2lpm   I/O last 3 completed shifts: In: 5176.3 [I.V.:1513; Blood:588; IV Piggyback:3075.3] Out: 750 [Urine:750]        PHYSICAL EXAM: Pt alert, approp nad @ 30 degrees hob No jvd Oropharynx clear,  mucosa nl  Neck supple Lungs with a minimal insp rhonchi bilaterally with very min crackles and no wheeze/ rhonchi  RRR no s3 or or sign murmur Abd mod obese  with softexcursion  Extr warm with no edema or clubbing noted Neuro  Sensorium nl ,  no apparent motor deficits     Lab Results  Component Value Date   ESRSEDRATE 140 (H) 08/02/2018   ESRSEDRATE 130 (H) 06/03/2017     Lab Results  Component Value Date   CD4TCELL 3 (L) 06/20/2018   CD4TABS <35 (L) 06/20/2018        CD4 absolute                                               41                                      07/12/18     RESOLVED PROBLEM LIST   ASSESSMENT AND PLAN   1) Acute resp failure in pt with very low CD4 and diffuse infiltrates / LD 487 is c/w PCP pna and doubt Bordetella has anything to do with this or any form of chronic progressive  ILD as was so much better at baseline prior to  acutely worse 6/20  Miscellaneous:Alv microlithiasis, alv proteinosis, asp, bronchiectais, BOOP   ARDS/ AIP Occupational dz/ HSP Neoplasm Infection - Dr Hale Bogus note/interventions reviewed, agree with broad empirical rx  Drug - none of the usual suspects Pulmonary emboli, Protein disorders Edema - unlikely with bnp 665 9/93 Eosinophilic dz - no Eos on peripheral smear 08/01/18 and on bal 07/12/18 Sarcoidosis - neg tbbx and bus 6/5  Connective tissue dz assoc with high ESR Hist X / Hemorrhage esp in pt with such low plts but so far no hemoptysis and no hematuria or AKI to suggest pumonary renal syndrome  Idiopathic    >>> for now continue rx with 02 titrated to keep sats > 95% and IV solumedrol 40 mg IV q 12       2) Hyponatremia ? SIADH  - improving on rx   3) severe thrombocyctopenia Lab Results  Component Value Date   PLT 28 (LL) 08/02/2018   PLT 17 (LL) 08/02/2018   PLT 22 (LL) 08/01/2018    - f/u per Heme/Oncology    4) New midline cp likely mscp from coughing but also may be gerd related from wide swings in gastric pressure during cough - rx max ppi/ ms prn     LABS  Glucose Recent Labs  Lab 08/02/18 2334 08/03/18 0748  GLUCAP 111* 203*    BMET Recent Labs  Lab 08/01/18 2227 08/02/18 1530 08/03/18 0819  NA 126* 131* 131*  K 4.7 4.7 4.6  CL 93* 104 107  CO2 20* 15* 14*  BUN 25* 17 17  CREATININE 1.23* 0.98 1.03*  GLUCOSE 107* 245* 214*    Liver Enzymes Recent Labs  Lab 08/01/18 2227 08/02/18 1530  AST 44* 34  ALT 21 18  ALKPHOS 122 104  BILITOT 1.9* 1.1  ALBUMIN 2.0* 1.9*    Electrolytes Recent Labs  Lab 08/01/18 2227 08/02/18 1530 08/03/18 0819  CALCIUM 7.5* 6.9* 6.9*  MG  --   --  1.9    CBC Recent Labs  Lab 08/01/18 2227 08/02/18  1000 08/02/18 2124  WBC 16.0* 18.0*  --   HGB 6.2* 9.3*  --   HCT 19.5* 29.0*  --   PLT 22* 17* 28*    ABG No results for input(s): PHART, PCO2ART, PO2ART in the last 168  hours.  Coag's No results for input(s): APTT, INR in the last 168 hours.  Sepsis Markers Recent Labs  Lab 08/02/18 2124 08/03/18 0612 08/03/18 0819  LATICACIDVEN 3.2* 1.7 4.4*    Cardiac Enzymes No results for input(s): TROPONINI, PROBNP in the last 168 hours.        The patient is critically ill with multiple organ systems failure and requires high complexity decision making for assessment and support, frequent evaluation and titration of therapies, application of advanced monitoring technologies and extensive interpretation of multiple databases. Critical Care Time devoted to patient care services described in this note is 45 minutes.    Christinia Gully, MD Pulmonary and [place] [phone] After 5:30 PM or weekends, use [facility] [phone]

## 2018-08-03 NOTE — Progress Notes (Signed)
PHARMACY NOTE -  Cefepime  Pharmacy has been assisting with dosing of Cefepime for PNA.  Dosage remains stable at 2g q12 hr and need for further dosage adjustment appears unlikely at present given SCr trending back up  Pharmacy will sign off, following peripherally for culture results or dose adjustments. Please reconsult if a change in clinical status warrants re-evaluation of dosage.  Reuel Boom, PharmD, BCPS [phone] 08/03/2018, 10:19 AM

## 2018-08-04 DIAGNOSIS — N179 Acute kidney failure, unspecified: Secondary | ICD-10-CM

## 2018-08-04 DIAGNOSIS — E871 Hypo-osmolality and hyponatremia: Secondary | ICD-10-CM

## 2018-08-04 DIAGNOSIS — B081 Molluscum contagiosum: Secondary | ICD-10-CM

## 2018-08-04 DIAGNOSIS — A419 Sepsis, unspecified organism: Secondary | ICD-10-CM

## 2018-08-04 DIAGNOSIS — I499 Cardiac arrhythmia, unspecified: Secondary | ICD-10-CM

## 2018-08-04 LAB — BASIC METABOLIC PANEL
Anion gap: 10 (ref 5–15)
Anion gap: 10 (ref 5–15)
BUN: 22 mg/dL — ABNORMAL HIGH (ref 6–20)
BUN: 25 mg/dL — ABNORMAL HIGH (ref 6–20)
CO2: 14 mmol/L — ABNORMAL LOW (ref 22–32)
CO2: 14 mmol/L — ABNORMAL LOW (ref 22–32)
Calcium: 7.2 mg/dL — ABNORMAL LOW (ref 8.9–10.3)
Calcium: 7.3 mg/dL — ABNORMAL LOW (ref 8.9–10.3)
Chloride: 105 mmol/L (ref 98–111)
Chloride: 109 mmol/L (ref 98–111)
Creatinine, Ser: 0.87 mg/dL (ref 0.44–1.00)
Creatinine, Ser: 0.93 mg/dL (ref 0.44–1.00)
GFR calc Af Amer: >60 mL/min (ref >60)
GFR calc Af Amer: >60 mL/min (ref >60)
GFR calc non Af Amer: >60 mL/min (ref >60)
GFR calc non Af Amer: >60 mL/min (ref >60)
Glucose, Bld: 124 mg/dL — ABNORMAL HIGH (ref 70–99)
Glucose, Bld: 115 mg/dL — ABNORMAL HIGH (ref 70–99)
Potassium: 4.7 mmol/L (ref 3.5–5.1)
Potassium: 4.3 mmol/L (ref 3.5–5.1)
Sodium: 129 mmol/L — ABNORMAL LOW (ref 135–145)
Sodium: 133 mmol/L — ABNORMAL LOW (ref 135–145)

## 2018-08-04 LAB — CBC WITH DIFFERENTIAL/PLATELET
Abs Immature Granulocytes: 1.81 10*3/uL — ABNORMAL HIGH (ref 0.00–0.07)
Basophils Absolute: 0 10*3/uL (ref 0.0–0.1)
Basophils Relative: 0 %
Eosinophils Absolute: 0.2 10*3/uL (ref 0.0–0.5)
Eosinophils Relative: 1 %
HCT: 27.8 % — ABNORMAL LOW (ref 36.0–46.0)
Hemoglobin: 8.6 g/dL — ABNORMAL LOW (ref 12.0–15.0)
Immature Granulocytes: 13 %
Lymphocytes Relative: 4 %
Lymphs Abs: 0.6 10*3/uL — ABNORMAL LOW (ref 0.7–4.0)
MCH: 30 pg (ref 26.0–34.0)
MCHC: 30.9 g/dL (ref 30.0–36.0)
MCV: 96.9 fL (ref 80.0–100.0)
Monocytes Absolute: 0.4 10*3/uL (ref 0.1–1.0)
Monocytes Relative: 3 %
Neutro Abs: 10.7 10*3/uL — ABNORMAL HIGH (ref 1.7–7.7)
Neutrophils Relative %: 79 %
Platelets: 17 10*3/uL — CL (ref 150–400)
RBC: 2.87 MIL/uL — ABNORMAL LOW (ref 3.87–5.11)
RDW: 23.6 % — ABNORMAL HIGH (ref 11.5–15.5)
WBC: 13.7 10*3/uL — ABNORMAL HIGH (ref 4.0–10.5)
nRBC: 1.5 % — ABNORMAL HIGH (ref 0.0–0.2)

## 2018-08-04 LAB — GLUCOSE, CAPILLARY
Glucose-Capillary: 114 mg/dL — ABNORMAL HIGH (ref 70–99)
Glucose-Capillary: 104 mg/dL — ABNORMAL HIGH (ref 70–99)
Glucose-Capillary: 117 mg/dL — ABNORMAL HIGH (ref 70–99)
Glucose-Capillary: 128 mg/dL — ABNORMAL HIGH (ref 70–99)

## 2018-08-04 LAB — LEGIONELLA PNEUMOPHILA SEROGP 1 UR AG: L. pneumophila Serogp 1 Ur Ag: NEGATIVE

## 2018-08-04 LAB — MAGNESIUM: Magnesium: 1.9 mg/dL (ref 1.7–2.4)

## 2018-08-04 LAB — PHOSPHORUS: Phosphorus: 2.7 mg/dL (ref 2.5–4.6)

## 2018-08-04 LAB — LACTIC ACID, PLASMA: Lactic Acid, Venous: 2 mmol/L (ref 0.5–1.9)

## 2018-08-04 MED ORDER — SODIUM CHLORIDE 0.9 % IV SOLN
2.0000 g | Freq: Three times a day (TID) | INTRAVENOUS | Status: DC
  Administered 2018-08-04 – 2018-08-06 (×7): 2 g via INTRAVENOUS
  Filled 2018-08-04 (×7): qty 2

## 2018-08-04 MED ORDER — NITROGLYCERIN 0.4 MG SL SUBL: 0.4000 mg | SUBLINGUAL_TABLET | SUBLINGUAL | Status: DC | PRN

## 2018-08-04 MED ORDER — DM-GUAIFENESIN ER 30-600 MG PO TB12
2.0000 | ORAL_TABLET | Freq: Two times a day (BID) | ORAL | Status: DC
  Administered 2018-08-04 – 2018-08-16 (×25): 2 via ORAL
  Filled 2018-08-04 (×17): qty 2
  Filled 2018-08-04: qty 1
  Filled 2018-08-04 (×2): qty 2
  Filled 2018-08-04: qty 1
  Filled 2018-08-04 (×4): qty 2

## 2018-08-04 MED ORDER — CHLORHEXIDINE GLUCONATE CLOTH 2 % EX PADS
6.0000 | MEDICATED_PAD | Freq: Every day | CUTANEOUS | Status: DC
  Administered 2018-08-04 – 2018-08-11 (×7): 6 via TOPICAL

## 2018-08-04 MED ORDER — SODIUM CHLORIDE 0.9 % IV BOLUS
1000.0000 mL | Freq: Once | INTRAVENOUS | Status: AC
  Administered 2018-08-04: 1000 mL via INTRAVENOUS

## 2018-08-04 MED ORDER — SODIUM BICARBONATE 650 MG PO TABS
650.0000 mg | ORAL_TABLET | Freq: Three times a day (TID) | ORAL | Status: DC
  Administered 2018-08-04 – 2018-08-07 (×10): 650 mg via ORAL
  Filled 2018-08-04 (×10): qty 1

## 2018-08-04 NOTE — Progress Notes (Signed)
PULMONARY / CRITICAL CARE MEDICINE   NAME:  Krystal Short, MRN:  [id number], DOB:  1985/10/15, LOS: 2 ADMISSION DATE:  08/01/2018, CONSULTATION DATE:  08/02/2018 REFERRING MD:  [facility], CHIEF COMPLAINT:  Sob/cough  BRIEF HISTORY:    33 yobf quit smoking 12/2016 with HIV presented with new sob evolving to moslty dry cough since 07/27/18 assoc with fever, aches in setting of most recent admit 5-28 to 07/16/18 with bordetella cultures on fob and rx with zmax and "all better" = back to nl activity level s need for 02 or saba then gradually worse sob x 6/20 s rigors, pleuritic cp, n or v or diarrhea > televist with pulm NP 6/23 rec zpak/ pred > no better so to ER 6/25 with   cxr c/w new as dz bilaterally so pccm consulted pm 08/02/18.      HISTORY OF PRESENT ILLNESS     Prev w/u for sob 06/18/18 in pulmonary offfice  DOE (dyspnea on exertion) Onset April 2019 with dx of HIV and pulmonary infiltrates c/w PCP  - 06/18/2018   Walked RA  2 laps @  approx 273f each @ slow/mod pace  stopped due to  Light headed > sob, sats 100%   Serial chest x-rays were reviewed and are consistent with resolving opportunistic infection most likely PCP with a normal exam and the expectation that she will gradually improve.  Note however she is still anemic and her main complaint is more related to that (fatigue, lightheaded with activity).  rec finish rx per ID and f/u in 3 weeks > did not return to pulmonary office as admitted with flare of same symptoms   Denies vapes or IV/inhaled drug exp   Past Medical History:  Diagnosis Date  . Acute hyponatremia 06/03/2017  . Anemia   . CAP (community acquired pneumonia) 06/03/2017  . Community acquired pneumonia 06/05/2017  . Facial dermatitis 06/03/2017  . HIV (human immunodeficiency virus infection) ([facility]   . Pleurisy 06/03/2017  . Pneumonia 06/06/2017  . 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 ([facility]   . Thrush of mouth and esophagus ([facility]   .  UTI (urinary tract infection)      SIGNIFICANT EVENTS:     STUDIES:   CTa 6/26: neg pe/ bilateral GG as dz  wotj adenopathy/ splenomegaly   CULTURES:  COVID 19  6/25 neg  BC x 2  6/25  Resp panel 6/26  Bordetllea PCR  6/26 MRSA screen 6/26 neg  Resp PCR 6/26  Pos coronavirus 229E Pneumocystis 6/26  >>>   ANTIBIOTICS per ID :  Vanc 6/25 -d/c 6/26 so got one dose only  Bicegravir 6/25 Maxepime 6/25 zmax  6/26  Clinda 6/26 Primaquine 6/26   LINES/TUBES:     CONSULTANTS:   ID  6/26 PCCM 6/26    SUBJECTIVE:  Much better cough, min white mucus / still sob with activity but now sats ok at rest RA  CONSTITUTIONAL: BP 110/79   Pulse (!) 122   Temp (!) 97.4 F (36.3 C) (Oral)   Resp (!) 36   Ht _0  (1.549 m)   Wt 56.7 kg   SpO2 93%   BMI 23.62 kg/m    On ? FIO2 > RA on my exam   I/O last 3 completed shifts: In: 1470.4 [I.V.:625.3; IV Piggyback:845.1] Out: -         PHYSICAL EXAM: Pt alert, approp nad @ < 30 degrees HOB / RA No jvd Oropharynx clear,  mucosa  nl Neck supple Lungs with minimal insp crackles  Bilaterally/ poor air movement in bases RRR no s3 or or sign murmur Abd soft/ nl excursion  Extr warm with no edema or clubbing noted Neuro  Sensorium intact ,  no apparent motor deficits     Lab Results  Component Value Date   ESRSEDRATE 140 (H) 08/02/2018   ESRSEDRATE 130 (H) 06/03/2017     Lab Results  Component Value Date   CD4TCELL 3 (L) 06/20/2018   CD4TABS <35 (L) 06/20/2018        CD4 absolute                                               41                                      07/12/18     RESOLVED PROBLEM LIST   ASSESSMENT AND PLAN   1) Acute resp failure in pt with very low CD4 and diffuse infiltrates / LD 487 is c/w PCP pna and doubt Bordetella has anything to do with this or any form of chronic progressive  ILD as was so much better at baseline prior to acutely worse 6/20  Miscellaneous:Alv microlithiasis, alv proteinosis,  asp, bronchiectais, BOOP   ARDS/ AIP Occupational dz/ HSP Neoplasm Infection - Dr Campbell/ Hatcher's  note/interventions reviewed, agree with broad empirical rx  Drug - none of the usual suspects Pulmonary emboli, Protein disorders Edema - unlikely with bnp 657 8/46 Eosinophilic dz - no Eos on peripheral smear 08/01/18 and on bal 07/12/18 Sarcoidosis - neg tbbx and bus 6/5 > makes sarcoid very unlikely  Connective tissue dz assoc with high ESR Hist X / Hemorrhage esp in pt with such low plts but so far no hemoptysis and no hematuria or AKI to suggest pumonary renal syndrome  Idiopathic    >>> for now continue rx with 02 titrated to keep sats > 95% and IV solumedrol 40 mg IV q 12   >>> recheck esr in am 6/29       2) Hyponatremia ? SIADH  - slt worse am 6/28 rx per [facility] unless our input is req   3) severe thrombocyctopenia Lab Results  Component Value Date   PLT 17 (LL) 08/04/2018   PLT 16 (LL) 08/03/2018   PLT 28 (LL) 08/02/2018    - chronic/  f/u per Heme/Oncology    4) New midline cp likely mscp from coughing but also may be gerd related from wide swings in gastric pressure during cough - rx max ppi/ ms prn >  Improved am 6/28      LABS  Glucose Recent Labs  Lab 08/02/18 2334 08/03/18 0748 08/03/18 1242 08/03/18 1612 08/03/18 2159 08/04/18 0831  GLUCAP 111* 203* 168* 160* 135* 114*    BMET Recent Labs  Lab 08/02/18 1530 08/03/18 0819 08/04/18 0237  NA 131* 131* 129*  K 4.7 4.6 4.7  CL 104 107 105  CO2 15* 14* 14*  BUN 17 17 22*  CREATININE 0.98 1.03* 0.87  GLUCOSE 245* 214* 124*    Liver Enzymes Recent Labs  Lab 08/01/18 2227 08/02/18 1530  AST 44* 34  ALT 21 18  ALKPHOS 122 104  BILITOT 1.9* 1.1  ALBUMIN 2.0* 1.9*  Electrolytes Recent Labs  Lab 08/02/18 1530 08/03/18 0819 08/04/18 0230 08/04/18 0237  CALCIUM 6.9* 6.9*  --  7.2*  MG  --  1.9 1.9  --   PHOS  --   --  2.7  --     CBC Recent Labs  Lab 08/02/18 1000  08/02/18 2124 08/03/18 0819 08/04/18 0237  WBC 18.0*  --  15.3* 13.7*  HGB 9.3*  --  8.4* 8.6*  HCT 29.0*  --  27.6* 27.8*  PLT 17* 28* 16* 17*    ABG No results for input(s): PHART, PCO2ART, PO2ART in the last 168 hours.  Coag's No results for input(s): APTT, INR in the last 168 hours.  Sepsis Markers Recent Labs  Lab 08/02/18 2124 08/03/18 0612 08/03/18 0819  LATICACIDVEN 3.2* 1.7 4.4*    Cardiac Enzymes No results for input(s): TROPONINI, PROBNP in the last 168 hours.       Christinia Gully, MD [facility] [phone] After 5:30 PM or weekends, use [facility] [phone]

## 2018-08-04 NOTE — Progress Notes (Signed)
PROGRESS NOTE  Krystal Short MGN:[id number] DOB: [birth year] DOA: 08/01/2018 PCP: Lajean Manes, MD  Brief History: Krystal Short  is a 33 y.o. female, former smoker (quit 12/2016), HIV 1+, HIV2-  CD4=10(06/05/2017), c/o cough (nonproductive) and dyspnea for the past 3 days. Pt notes fever.  Pt denies chills, cp, palp, n/v, abd pain, diarrhea, brbpr, dysuria, hematuria.  Pt presented for evaluation of cough/ dyspnea.      HPI/Recap of past 24 hours:  Fever resolved, still has tachycardia and tachypnea Less Dry cough, being sore from the cough Overall feeling a little better    Assessment/Plan: Active Problems:   Hyponatremia   AIDS (acquired immune deficiency syndrome) (HCC)   Molluscum contagiosum   Symptomatic anemia   Thrombocytopenia (HCC)   Pulmonary infiltrates   Fever   Sepsis (HCC)   Acute respiratory failure with hypoxemia (HCC)   Chest pain  Acute hypoxic respiratory failure with fever , bilateral lung infiltrate, sepsis on presentation -SARS COV 2 screening negative, mrsa screening negative  -Respiratory viral panel + common cold virus (coronavirus 229E) -blood culture no growth so far -bordetella pertussis pcr in process -parvo b19 igg/igm in process -she recently underwent bronchoscopy on 6/5, biopsy" SCANT LUNG PARENCHYMA WITH FIBROSIS AND REACTIVE CHANGES. - NO GRANULOMATA IDENTIFIED." -concerning for pcp , sputum pcp smear in process, now on primaquine and clindamycin in addition to cefepime, zithromax, steroids,  ID/pulm/critical care consulted  Patient c/o 10/10 chest pain /back pain intermittent,  High sensitivity troponin unremarkable, CTA chest/ab negative for  dissection , no large PE Could it be acid reflux with esophageal spasm , she is on ppi twice a day, prn nitroglycerin  Prn morphin Reports improved  Hyponatremia: from dehydration vs SIADH, she already received multiple fluids bolus, urine sodium /osmo tests will not be reliable She does  reports drinking a lot of water  Sodium dropped again, today is 129, fluids restriction, repeat labs  AKI: Cr 1.23 on presentation, cr normalized , today is 9.16 Metabolic acidosis, start sodium bicarb   Acute normocytic anemia/acute thrombocytopenia: S/p prbc transfusionx2 , hgb improved from 6 to 9, S/p plt x1 on 6/26  Hematology consulted, will follow recommendation  Elevated blood glucose, appear has been chronic No prior diagnosis of diabetes,  a1c 4.7, could be from stress, steroids  HIV/AIDS CD4=10 06/05/2017 On biktarvy ID following   Frequent hospitalizations: Review of records summarized by Dr Maudie Mercury on admission: "Admission 4/28-06/11/2017 tx empirically for PCP pneumonia  Admission 1/31-03/10/2018 Acute respiratory failure secondary to pneumonia (rhinovirus) tx with rocephin/ zithromax, Bactrim for PCP Macrocytic anemia   Admission 4/21-4/24/2020 tx empirically for PCP pneumonia with Bactrim and Prednisone CT chest=> enlarged hilar lymph nodes ? Sarcoidosis   Admission 5/28-07/16/2018 tx w Vanco, cefepime, zithromax for pneumonia Bronchoscopy 6/5=> bordetella bronchiseptica tx with zithromax Hematology consulted for thrombocytopenia (not TTP)  Bone marrow biopsy 5/29 nonspecific HGSIL => needs outpatient follow up"  Code Status: full  Family Communication: patient   Disposition Plan: critically ill, remain in ICU/stepdown   Consultants:  Hematology  ID  Pulmonology/critical care  Procedures:  prbcx2 transfusion  plt x1 transfusion   Antibiotics:  As above   Objective: BP 110/79   Pulse (!) 122   Temp 98.2 F (36.8 C) (Oral)   Resp (!) 36   Ht 5' 1"  (1.549 m)   Wt 56.7 kg   SpO2 93%   BMI 23.62 kg/m   Intake/Output Summary (Last 24 hours) at 08/04/2018 9450 Last data filed at 08/04/2018  3704 Gross per 24 hour  Intake 396.97 ml  Output -  Net 396.97 ml   Filed Weights   08/01/18 2226 08/02/18 0103  Weight: 56.7 kg 56.7 kg     Exam: Patient is examined daily including today on 08/04/2018, exams remain the same as of yesterday except that has changed    General:  Weak, tachypnea, alert, oriented x3, molluscum skin lesions on face  Cardiovascular: sinus tachycardia  Respiratory: slightly course, no wheezing, no rales, no rhonchi   Abdomen: Soft/ND/NT, positive BS  Musculoskeletal: No Edema  Neuro: alert, oriented   Data Reviewed: Basic Metabolic Panel: Recent Labs  Lab 08/01/18 2227 08/02/18 1530 08/03/18 0819 08/04/18 0237  NA 126* 131* 131* 129*  K 4.7 4.7 4.6 4.7  CL 93* 104 107 105  CO2 20* 15* 14* 14*  GLUCOSE 107* 245* 214* 124*  BUN 25* 17 17 22*  CREATININE 1.23* 0.98 1.03* 0.87  CALCIUM 7.5* 6.9* 6.9* 7.2*  MG  --   --  1.9  --    Liver Function Tests: Recent Labs  Lab 08/01/18 2227 08/02/18 1530  AST 44* 34  ALT 21 18  ALKPHOS 122 104  BILITOT 1.9* 1.1  PROT 6.9 6.3*  ALBUMIN 2.0* 1.9*   No results for input(s): LIPASE, AMYLASE in the last 168 hours. No results for input(s): AMMONIA in the last 168 hours. CBC: Recent Labs  Lab 08/01/18 2227 08/02/18 1000 08/02/18 2124 08/03/18 0819 08/04/18 0237  WBC 16.0* 18.0*  --  15.3* 13.7*  NEUTROABS 11.8*  --   --  13.3* 10.7*  HGB 6.2* 9.3*  --  8.4* 8.6*  HCT 19.5* 29.0*  --  27.6* 27.8*  MCV 104.3* 95.7  --  97.2 96.9  PLT 22* 17* 28* 16* 17*   Cardiac Enzymes:   No results for input(s): CKTOTAL, CKMB, CKMBINDEX, TROPONINI in the last 168 hours. BNP (last 3 results) Recent Labs    03/08/18 2129 08/02/18 1520  BNP 8.8 144.0*    ProBNP (last 3 results) No results for input(s): PROBNP in the last 8760 hours.  CBG: Recent Labs  Lab 08/02/18 2334 08/03/18 0748 08/03/18 1242 08/03/18 1612 08/03/18 2159  GLUCAP 111* 203* 168* 160* 135*    Recent Results (from the past 240 hour(s))  Novel Coronavirus, NAA ([facility])     Status: None   Collection Time: 07/30/18 10:46 AM  Result Value Ref Range Status    SARS-CoV-2, NAA Not Detected Not Detected Final    Comment: Testing was performed using the cobas(R) SARS-CoV-2 test. This test was developed and its performance characteristics determined by [employer]. This test has not been FDA cleared or approved. This test has been authorized by FDA under an Emergency Use Authorization (EUA). This test is only authorized for the duration of time the declaration that circumstances exist justifying the authorization of the emergency use of in vitro diagnostic tests for detection of SARS-CoV-2 virus and/or diagnosis of COVID-19 infection under section 564(b)(1) of the Act, 21 U.S.C. 888BVQ-9(I)(5), unless the authorization is terminated or revoked sooner. When diagnostic testing is negative, the possibility of a false negative result should be considered in the context of a patient's recent exposures and the presence of clinical signs and symptoms consistent with COVID-19. An individual without symptoms of COVID-19 and who is not shedding SARS-CoV-2 virus would expect to have a negati ve (not detected) result in this assay.   Blood Culture (routine x 2)     Status: None (  Preliminary result)   Collection Time: 08/01/18 10:27 PM   Specimen: BLOOD  Result Value Ref Range Status   Specimen Description   Final    BLOOD SITE NOT SPECIFIED Performed at [facility], [address], [place], [place] [address]    Special Requests   Final    BOTTLES DRAWN AEROBIC AND ANAEROBIC Blood Culture adequate volume Performed at [place] [address], [place], [place] [address]    Culture   Final    NO GROWTH 1 DAY Performed at [facility], [place] [address] [place], [place] [address]    Report Status PENDING  Incomplete  SARS Coronavirus 2 (CEPHEID- Performed in [name] hospital lab), Hosp Order     Status: None   Collection Time: 08/01/18 10:28 PM   Specimen: Nasopharyngeal Swab  Result Value Ref Range Status    SARS Coronavirus 2 NEGATIVE NEGATIVE Final    Comment: (NOTE) If result is NEGATIVE SARS-CoV-2 target nucleic acids are NOT DETECTED. The SARS-CoV-2 RNA is generally detectable in upper and lower  respiratory specimens during the acute phase of infection. The lowest  concentration of SARS-CoV-2 viral copies this assay can detect is 250  copies / mL. A negative result does not preclude SARS-CoV-2 infection  and should not be used as the sole basis for treatment or other  patient management decisions.  A negative result may occur with  improper specimen collection / handling, submission of specimen other  than nasopharyngeal swab, presence of viral mutation(s) within the  areas targeted by this assay, and inadequate number of viral copies  (<250 copies / mL). A negative result must be combined with clinical  observations, patient history, and epidemiological information. If result is POSITIVE SARS-CoV-2 target nucleic acids are DETECTED. The SARS-CoV-2 RNA is generally detectable in upper and lower  respiratory specimens dur ing the acute phase of infection.  Positive  results are indicative of active infection with SARS-CoV-2.  Clinical  correlation with patient history and other diagnostic information is  necessary to determine patient infection status.  Positive results do  not rule out bacterial infection or co-infection with other viruses. If result is PRESUMPTIVE POSTIVE SARS-CoV-2 nucleic acids MAY BE PRESENT.   A presumptive positive result was obtained on the submitted specimen  and confirmed on repeat testing.  While 2019 novel coronavirus  (SARS-CoV-2) nucleic acids may be present in the submitted sample  additional confirmatory testing may be necessary for epidemiological  and / or clinical management purposes  to differentiate between  SARS-CoV-2 and other Sarbecovirus currently known to infect humans.  If clinically indicated additional testing with an alternate test   methodology [phone]) is advised. The SARS-CoV-2 RNA is generally  detectable in upper and lower respiratory sp ecimens during the acute  phase of infection. The expected result is Negative. Fact Sheet for Patients:  StrictlyIdeas.no Fact Sheet for Healthcare Providers: BankingDealers.co.za This test is not yet approved or cleared by the [place] FDA and has been authorized for detection and/or diagnosis of SARS-CoV-2 by FDA under an Emergency Use Authorization (EUA).  This EUA will remain in effect (meaning this test can be used) for the duration of the COVID-19 declaration under Section 564(b)(1) of the Act, 21 U.S.C. section 360bbb-3(b)(1), unless the authorization is terminated or revoked sooner. Performed at [facility], [place] [address] [place], [place] [address]   MRSA PCR Screening     Status: None   Collection Time: 08/02/18  3:11 AM   Specimen: Nasal Mucosa; Nasopharyngeal  Result Value Ref Range Status   MRSA by PCR NEGATIVE NEGATIVE Final    Comment:        The GeneXpert MRSA Assay (FDA approved for NASAL specimens only), is one component of a comprehensive MRSA colonization surveillance program. It is not intended to diagnose MRSA infection nor to guide or monitor treatment for MRSA infections. Performed at [facility], [place] [address], [place], [place] [address]   Blood Culture (routine x 2)     Status: None (Preliminary result)   Collection Time: 08/02/18 11:24 AM   Specimen: BLOOD LEFT ARM  Result Value Ref Range Status   Specimen Description   Final    BLOOD LEFT ARM Performed at [facility], [place] [address] [place], [place] [address]    Special Requests   Final    BOTTLES DRAWN AEROBIC AND ANAEROBIC Blood Culture adequate volume Performed at [place] [address] [place], [place] [address]    Culture   Final    NO GROWTH 1 DAY  Performed at [facility], [place] [address] [place], [place] [address]    Report Status PENDING  Incomplete  Respiratory Panel by PCR     Status: Abnormal   Collection Time: 08/02/18 12:19 PM   Specimen: Nasopharyngeal Swab; Respiratory  Result Value Ref Range Status   Adenovirus NOT DETECTED NOT DETECTED Final   Coronavirus 229E DETECTED (A) NOT DETECTED Final    Comment: (NOTE) The Coronavirus on the Respiratory Panel, DOES NOT test for the novel  Coronavirus (2019 nCoV)    Coronavirus HKU1 NOT DETECTED NOT DETECTED Final   Coronavirus NL63 NOT DETECTED NOT DETECTED Final   Coronavirus OC43 NOT DETECTED NOT DETECTED Final   Metapneumovirus NOT DETECTED NOT DETECTED Final   Rhinovirus / Enterovirus NOT DETECTED NOT DETECTED Final   Influenza A NOT DETECTED NOT DETECTED Final   Influenza B NOT DETECTED NOT DETECTED Final   Parainfluenza Virus 1 NOT DETECTED NOT DETECTED Final   Parainfluenza Virus 2 NOT DETECTED NOT DETECTED Final   Parainfluenza Virus 3 NOT DETECTED NOT DETECTED Final   Parainfluenza Virus 4 NOT DETECTED NOT DETECTED Final   Respiratory Syncytial Virus NOT DETECTED NOT DETECTED Final   Bordetella pertussis NOT DETECTED NOT DETECTED Final   Chlamydophila pneumoniae NOT DETECTED NOT DETECTED Final   Mycoplasma pneumoniae NOT DETECTED NOT DETECTED Final    Comment: Performed at [facility], [place] [place],  [address]     Studies: No results found.  Scheduled Meds: . B-complex with vitamin C  1 tablet Oral Daily  . bictegravir-emtricitabine-tenofovir AF  1 tablet Oral Daily  . folic acid  2 mg Oral Daily  . guaiFENesin  600 mg Oral BID  . insulin aspart  0-9 Units Subcutaneous TID WC  . mouth rinse  15 mL Mouth Rinse BID  . methylPREDNISolone (SOLU-MEDROL) injection  40 mg Intravenous Q12H  .  morphine injection  1 mg Intravenous Once  . pantoprazole  40 mg Oral BID  . primaquine  30 mg Oral Daily  . sodium bicarbonate  650 mg  Oral TID    Continuous Infusions: . ceFEPime (MAXIPIME) IV Stopped (08/03/18 2230)  . clindamycin (CLEOCIN) IV Stopped (08/04/18 0547)  . lactated ringers     And  . lactated ringers    . sodium chloride       Time spent: 22mns  I have personally reviewed and interpreted on  08/04/2018 daily labs, tele strips, imagings as discussed above under date review session and assessment and plans.  I reviewed all nursing notes, pharmacy notes, consultant notes,  vitals, pertinent old records  I have discussed plan of care as described above with RN , patient on 08/04/2018   Florencia Reasons MD, PhD  [facility] Pager [phone]. If 7PM-7AM, please contact night-coverage at [email or web address], password [email or web address] 08/04/2018, 8:19 AM  LOS: 2 days

## 2018-08-04 NOTE — Progress Notes (Signed)
PHARMACY NOTE:  ANTIMICROBIAL RENAL DOSAGE ADJUSTMENT  Current antimicrobial regimen includes a mismatch between antimicrobial dosage and estimated renal function. As per policy approved by the Pharmacy & Therapeutics and Medical Executive Committees, the antimicrobial dosage will be adjusted accordingly.  Current antimicrobial and dosage:  Cefepime 2 g q12 hr  Indication: r/o PNA  Renal Function:   Estimated Creatinine Clearance: 70.7 mL/min (by C-G formula based on SCr of 0.87 mg/dL). []      On intermittent HD, scheduled: []      On CRRT    Antimicrobial dosage has been changed to:  Cefepime 2g q8 hr    Additional Comments: n/a   Thank you for allowing pharmacy to be a part of this patient's care.  Reuel Boom, PharmD, BCPS [phone] 08/04/2018, 9:44 AM

## 2018-08-04 NOTE — Progress Notes (Signed)
INFECTIOUS DISEASE PROGRESS NOTE  ID: Krystal Short is a 33 y.o. female with  Active Problems:   Hyponatremia   AIDS (acquired immune deficiency syndrome) (HCC)   Molluscum contagiosum   Symptomatic anemia   Thrombocytopenia (HCC)   Pulmonary infiltrates   Fever   Sepsis ([facility])   Acute respiratory failure with hypoxemia (HCC)   Chest pain  Subjective: Continued SOB, occas cough.  weakness  Abtx:  Anti-infectives (From admission, onward)   Start     Dose/Rate Route Frequency Ordered Stop   08/04/18 1000  ceFEPIme (MAXIPIME) 2 g in sodium chloride 0.9 % 100 mL IVPB     2 g 200 mL/hr over 30 Minutes Intravenous Every 8 hours 08/04/18 0944     08/03/18 0300  vancomycin (VANCOCIN) IVPB 1000 mg/200 mL premix  Status:  Discontinued     1,000 mg 200 mL/hr over 60 Minutes Intravenous Daily 08/02/18 0202 08/02/18 1527   08/02/18 1630  primaquine tablet 30 mg     30 mg Oral Daily 08/02/18 1527     08/02/18 1530  clindamycin (CLEOCIN) IVPB 900 mg     900 mg 100 mL/hr over 30 Minutes Intravenous Every 8 hours 08/02/18 1527     08/02/18 1000  ceFEPIme (MAXIPIME) 2 g in sodium chloride 0.9 % 100 mL IVPB  Status:  Discontinued     2 g 200 mL/hr over 30 Minutes Intravenous Every 12 hours 08/02/18 0202 08/04/18 0944   08/02/18 1000  azithromycin (ZITHROMAX) tablet 250 mg    Note to Pharmacy: Zpack taper as directed     250 mg Oral Daily 08/02/18 0302 08/03/18 1018   08/02/18 1000  bictegravir-emtricitabine-tenofovir AF (BIKTARVY) 50-200-25 MG per tablet 1 tablet     1 tablet Oral Daily 08/02/18 0302     08/01/18 2300  vancomycin (VANCOCIN) 1,250 mg in sodium chloride 0.9 % 250 mL IVPB     1,250 mg 166.7 mL/hr over 90 Minutes Intravenous  Once 08/01/18 2242 08/02/18 0309   08/01/18 2245  ceFEPIme (MAXIPIME) 2 g in sodium chloride 0.9 % 100 mL IVPB     2 g 200 mL/hr over 30 Minutes Intravenous NOW 08/01/18 2241 08/02/18 0058      Medications:  Scheduled: . B-complex with vitamin C   1 tablet Oral Daily  . bictegravir-emtricitabine-tenofovir AF  1 tablet Oral Daily  . Chlorhexidine Gluconate Cloth  6 each Topical Daily  . dextromethorphan-guaiFENesin  2 tablet Oral BID  . folic acid  2 mg Oral Daily  . insulin aspart  0-9 Units Subcutaneous TID WC  . mouth rinse  15 mL Mouth Rinse BID  . methylPREDNISolone (SOLU-MEDROL) injection  40 mg Intravenous Q12H  .  morphine injection  1 mg Intravenous Once  . pantoprazole  40 mg Oral BID  . primaquine  30 mg Oral Daily  . sodium bicarbonate  650 mg Oral TID    Objective: Vital signs in last 24 hours: Temp:  [97.4 F (36.3 C)-98.4 F (36.9 C)] 97.4 F (36.3 C) (06/28 0855) Pulse Rate:  [117-128] 127 (06/28 1000) Resp:  [23-40] 33 (06/28 1100) BP: (84-122)/(64-91) 118/88 (06/28 1100) SpO2:  [91 %-94 %] 94 % (06/28 1000)   General appearance: alert, cooperative and mild distress Resp: rhonchi anterior - bilateral Cardio: regularly irregular rhythm GI: normal findings: bowel sounds normal and soft, non-tender  Lab Results Recent Labs    08/03/18 0819 08/04/18 0237  WBC 15.3* 13.7*  HGB 8.4* 8.6*  HCT 27.6* 27.8*  NA 131* 129*  K 4.6 4.7  CL 107 105  CO2 14* 14*  BUN 17 22*  CREATININE 1.03* 0.87   Liver Panel Recent Labs    08/01/18 2227 08/02/18 1530  PROT 6.9 6.3*  ALBUMIN 2.0* 1.9*  AST 44* 34  ALT 21 18  ALKPHOS 122 104  BILITOT 1.9* 1.1   Sedimentation Rate Recent Labs    08/02/18 1124  ESRSEDRATE 140*   C-Reactive Protein No results for input(s): CRP in the last 72 hours.  Microbiology: Recent Results (from the past 240 hour(s))  Novel Coronavirus, NAA ([facility])     Status: None   Collection Time: 07/30/18 10:46 AM  Result Value Ref Range Status   SARS-CoV-2, NAA Not Detected Not Detected Final    Comment: Testing was performed using the cobas(R) SARS-CoV-2 test. This test was developed and its performance characteristics determined by [employer]. This test has not  been FDA cleared or approved. This test has been authorized by FDA under an Emergency Use Authorization (EUA). This test is only authorized for the duration of time the declaration that circumstances exist justifying the authorization of the emergency use of in vitro diagnostic tests for detection of SARS-CoV-2 virus and/or diagnosis of COVID-19 infection under section 564(b)(1) of the Act, 21 U.S.C. 875IEP-3(I)(9), unless the authorization is terminated or revoked sooner. When diagnostic testing is negative, the possibility of a false negative result should be considered in the context of a patient's recent exposures and the presence of clinical signs and symptoms consistent with COVID-19. An individual without symptoms of COVID-19 and who is not shedding SARS-CoV-2 virus would expect to have a negati ve (not detected) result in this assay.   Blood Culture (routine x 2)     Status: None (Preliminary result)   Collection Time: 08/01/18 10:27 PM   Specimen: BLOOD  Result Value Ref Range Status   Specimen Description   Final    BLOOD SITE NOT SPECIFIED Performed at [facility], [address], [place], [place] [address]    Special Requests   Final    BOTTLES DRAWN AEROBIC AND ANAEROBIC Blood Culture adequate volume Performed at [facility] [address] [place], [place] [address]    Culture   Final    NO GROWTH 2 DAYS Performed at [facility] [address], [place], [place] [address]    Report Status PENDING  Incomplete  SARS Coronavirus 2 (CEPHEID- Performed in [facility] hospital lab), Hosp Order     Status: None   Collection Time: 08/01/18 10:28 PM   Specimen: Nasopharyngeal Swab  Result Value Ref Range Status   SARS Coronavirus 2 NEGATIVE NEGATIVE Final    Comment: (NOTE) If result is NEGATIVE SARS-CoV-2 target nucleic acids are NOT DETECTED. The SARS-CoV-2 RNA is generally detectable in upper and lower  respiratory specimens during  the acute phase of infection. The lowest  concentration of SARS-CoV-2 viral copies this assay can detect is 250  copies / mL. A negative result does not preclude SARS-CoV-2 infection  and should not be used as the sole basis for treatment or other  patient management decisions.  A negative result may occur with  improper specimen collection / handling, submission of specimen other  than nasopharyngeal swab, presence of viral mutation(s) within the  areas targeted by this assay, and inadequate number of viral copies  (<250 copies / mL). A negative result must be combined with clinical  observations, patient history, and  epidemiological information. If result is POSITIVE SARS-CoV-2 target nucleic acids are DETECTED. The SARS-CoV-2 RNA is generally detectable in upper and lower  respiratory specimens dur ing the acute phase of infection.  Positive  results are indicative of active infection with SARS-CoV-2.  Clinical  correlation with patient history and other diagnostic information is  necessary to determine patient infection status.  Positive results do  not rule out bacterial infection or co-infection with other viruses. If result is PRESUMPTIVE POSTIVE SARS-CoV-2 nucleic acids MAY BE PRESENT.   A presumptive positive result was obtained on the submitted specimen  and confirmed on repeat testing.  While 2019 novel coronavirus  (SARS-CoV-2) nucleic acids may be present in the submitted sample  additional confirmatory testing may be necessary for epidemiological  and / or clinical management purposes  to differentiate between  SARS-CoV-2 and other Sarbecovirus currently known to infect humans.  If clinically indicated additional testing with an alternate test  methodology [id number]) is advised. The SARS-CoV-2 RNA is generally  detectable in upper and lower respiratory sp ecimens during the acute  phase of infection. The expected result is Negative. Fact Sheet for Patients:   StrictlyIdeas.no Fact Sheet for Healthcare Providers: BankingDealers.co.za This test is not yet approved or cleared by the [place] FDA and has been authorized for detection and/or diagnosis of SARS-CoV-2 by FDA under an Emergency Use Authorization (EUA).  This EUA will remain in effect (meaning this test can be used) for the duration of the COVID-19 declaration under Section 564(b)(1) of the Act, 21 U.S.C. section 360bbb-3(b)(1), unless the authorization is terminated or revoked sooner. Performed at [facility], [place] [address], [place], [place] [address]   MRSA PCR Screening     Status: None   Collection Time: 08/02/18  3:11 AM   Specimen: Nasal Mucosa; Nasopharyngeal  Result Value Ref Range Status   MRSA by PCR NEGATIVE NEGATIVE Final    Comment:        The GeneXpert MRSA Assay (FDA approved for NASAL specimens only), is one component of a comprehensive MRSA colonization surveillance program. It is not intended to diagnose MRSA infection nor to guide or monitor treatment for MRSA infections. Performed at [facility], [place] [address], [place], [place] [address]   Blood Culture (routine x 2)     Status: None (Preliminary result)   Collection Time: 08/02/18 11:24 AM   Specimen: BLOOD LEFT ARM  Result Value Ref Range Status   Specimen Description   Final    BLOOD LEFT ARM Performed at [facility], [place] [address], [place], [place] [address]    Special Requests   Final    BOTTLES DRAWN AEROBIC AND ANAEROBIC Blood Culture adequate volume Performed at [facility] [address] [place], [place] [address]    Culture   Final    NO GROWTH 2 DAYS Performed at [facility] [address], [place], [place] [address]    Report Status PENDING  Incomplete  Respiratory Panel by PCR     Status: Abnormal   Collection Time: 08/02/18 12:19 PM   Specimen:  Nasopharyngeal Swab; Respiratory  Result Value Ref Range Status   Adenovirus NOT DETECTED NOT DETECTED Final   Coronavirus 229E DETECTED (A) NOT DETECTED Final    Comment: (NOTE) The Coronavirus on the Respiratory Panel, DOES NOT test for the novel  Coronavirus (2019 nCoV)    Coronavirus HKU1 NOT DETECTED NOT DETECTED Final   Coronavirus NL63 NOT  DETECTED NOT DETECTED Final   Coronavirus OC43 NOT DETECTED NOT DETECTED Final   Metapneumovirus NOT DETECTED NOT DETECTED Final   Rhinovirus / Enterovirus NOT DETECTED NOT DETECTED Final   Influenza A NOT DETECTED NOT DETECTED Final   Influenza B NOT DETECTED NOT DETECTED Final   Parainfluenza Virus 1 NOT DETECTED NOT DETECTED Final   Parainfluenza Virus 2 NOT DETECTED NOT DETECTED Final   Parainfluenza Virus 3 NOT DETECTED NOT DETECTED Final   Parainfluenza Virus 4 NOT DETECTED NOT DETECTED Final   Respiratory Syncytial Virus NOT DETECTED NOT DETECTED Final   Bordetella pertussis NOT DETECTED NOT DETECTED Final   Chlamydophila pneumoniae NOT DETECTED NOT DETECTED Final   Mycoplasma pneumoniae NOT DETECTED NOT DETECTED Final    Comment: Performed at [facility], [place] [address], [place], [place] [address]    Studies/Results: Dg Chest Port 1 View  Result Date: 08/02/2018 CLINICAL DATA:  Pt reported center chest pain and SOB that started a few minutes ago. EXAM: PORTABLE CHEST - 1 VIEW COMPARISON:  none FINDINGS: Worsening bilateral reticulonodular ground-glass opacities. Heart size and mediastinal contours are within normal limits. No effusion. No pneumothorax. Visualized bones unremarkable. IMPRESSION: Continued worsening of bilateral reticulonodular ground-glass opacities. Electronically Signed   By: Lucrezia Europe M.D.   On: 08/02/2018 15:51   Ct Angio Chest/abd/pel For Dissection W And/or W/wo  Result Date: 08/02/2018 CLINICAL DATA:  Back pain EXAM: CT ANGIOGRAPHY CHEST, ABDOMEN AND PELVIS TECHNIQUE: Multidetector CT imaging through  the chest, abdomen and pelvis was performed using the standard protocol during bolus administration of intravenous contrast. Multiplanar reconstructed images and MIPs were obtained and reviewed to evaluate the vascular anatomy. CONTRAST:  132mL OMNIPAQUE IOHEXOL 350 MG/ML SOLN COMPARISON:  CT chest dated Jul 04, 2018 FINDINGS: CTA CHEST FINDINGS Cardiovascular: Evaluation for pulmonary emboli is limited by respiratory motion artifact. Given this limitation, there is no large centrally located pulmonary embolus. There is no definite dissection. The heart size is normal. There is no large pericardial effusion. The intracardiac blood pool is hypodense relative to the adjacent myocardium consistent with anemia. Mediastinum/Nodes: There are enlarged mediastinal and hilar lymph nodes. For example there is a right paratracheal lymph node measuring approximately 1.4 cm. The thyroid gland is unremarkable. There is mild supraclavicular adenopathy. Lungs/Pleura: There are diffuse ground-glass airspace opacities bilaterally with prominent interlobular septal thickening. There are trace bilateral pleural effusions. There is no pneumothorax. The trachea is unremarkable. Musculoskeletal: No chest wall abnormality. No acute or significant osseous findings. Review of the MIP images confirms the above findings. CTA ABDOMEN AND PELVIS FINDINGS VASCULAR Aorta: Normal caliber aorta without aneurysm, dissection, vasculitis or significant stenosis. Celiac: Patent without evidence of aneurysm, dissection, vasculitis or significant stenosis. SMA: Patent without evidence of aneurysm, dissection, vasculitis or significant stenosis. Renals: Both renal arteries are patent without evidence of aneurysm, dissection, vasculitis, fibromuscular dysplasia or significant stenosis. IMA: Patent without evidence of aneurysm, dissection, vasculitis or significant stenosis. Inflow: Patent without evidence of aneurysm, dissection, vasculitis or significant  stenosis. Veins: No obvious venous abnormality within the limitations of this arterial phase study. Review of the MIP images confirms the above findings. NON-VASCULAR Hepatobiliary: There is hepatic steatosis. The gallbladder is unremarkable. Pancreas: Unremarkable. No pancreatic ductal dilatation or surrounding inflammatory changes. Spleen: The spleen is significantly enlarged. Adrenals/Urinary Tract: Adrenal glands are unremarkable. Kidneys are normal, without renal calculi, focal lesion, or hydronephrosis. Bladder is unremarkable. Stomach/Bowel: Stomach is within normal limits. Appendix appears normal. No evidence of bowel wall thickening,  distention, or inflammatory changes. Lymphatic: There is scattered prominent retroperitoneal and mesenteric lymph nodes. There are prominent inguinal lymph nodes. Reproductive: Uterus and bilateral adnexa are unremarkable. Other: There is mild body wall edema. There is a small volume of free fluid in the pelvis. Musculoskeletal: No acute or significant osseous findings. Review of the MIP images confirms the above findings. IMPRESSION: 1. No evidence of a dissection. 2. Evaluation for pulmonary emboli is limited by motion artifact. Given this limitation there is no large centrally located pulmonary embolus. 3. Again noted are diffuse bilateral ground-glass airspace opacities most likely representing an atypical infectious process. Sarcoidosis can have a similar appearance in the appropriate clinical setting. 4. Trace bilateral pleural effusions. 5. Scattered adenopathy throughout the chest, abdomen, and pelvis, likely related to the patient's underlying history of HIV. 6. Splenomegaly. 7. Hepatic steatosis. 8. Small volume free fluid in the pelvis, likely physiologic. Electronically Signed   By: Constance Holster M.D.   On: 08/02/2018 23:15     Assessment/Plan: AIDS (dx 06-2018) Suspected PCP [facility]  Total days of antibiotics: 2  clinda/primaquine/cefepime/azithro biktarvy  fungal Ab, urine histo pending ACE level pending Her PLT count and h/h are stable.  Her previous lung bx and LN bx from 6-8 were negative. Appreciate pulmonary f/u.          Bobby Rumpf MD, FACP Infectious Diseases (pager) [phone] [email or web address] 08/04/2018, 12:44 PM  LOS: 2 days

## 2018-08-04 NOTE — Progress Notes (Signed)
CRITICAL VALUE ALERT  Critical Value:  Lactic acid 2.0  Date & Time Notied:  4068  Provider Notified: Dr. Erlinda Hong  Orders Received/Actions taken: none received, will continue to monitor

## 2018-08-05 DIAGNOSIS — B9729 Other coronavirus as the cause of diseases classified elsewhere: Secondary | ICD-10-CM

## 2018-08-05 DIAGNOSIS — R000 Tachycardia, unspecified: Secondary | ICD-10-CM

## 2018-08-05 DIAGNOSIS — J1289 Other viral pneumonia: Secondary | ICD-10-CM

## 2018-08-05 LAB — CBC WITH DIFFERENTIAL/PLATELET
Abs Immature Granulocytes: 2.15 10*3/uL — ABNORMAL HIGH (ref 0.00–0.07)
Basophils Absolute: 0.1 10*3/uL (ref 0.0–0.1)
Basophils Relative: 0 %
Eosinophils Absolute: 0.2 10*3/uL (ref 0.0–0.5)
Eosinophils Relative: 1 %
HCT: 24.9 % — ABNORMAL LOW (ref 36.0–46.0)
Hemoglobin: 7.6 g/dL — ABNORMAL LOW (ref 12.0–15.0)
Immature Granulocytes: 18 %
Lymphocytes Relative: 4 %
Lymphs Abs: 0.4 10*3/uL — ABNORMAL LOW (ref 0.7–4.0)
MCH: 29.9 pg (ref 26.0–34.0)
MCHC: 30.5 g/dL (ref 30.0–36.0)
MCV: 98 fL (ref 80.0–100.0)
Monocytes Absolute: 0.5 10*3/uL (ref 0.1–1.0)
Monocytes Relative: 4 %
Neutro Abs: 8.8 10*3/uL — ABNORMAL HIGH (ref 1.7–7.7)
Neutrophils Relative %: 73 %
Platelets: 13 10*3/uL — CL (ref 150–400)
RBC: 2.54 MIL/uL — ABNORMAL LOW (ref 3.87–5.11)
RDW: 23.7 % — ABNORMAL HIGH (ref 11.5–15.5)
WBC: 12 10*3/uL — ABNORMAL HIGH (ref 4.0–10.5)
nRBC: 2.2 % — ABNORMAL HIGH (ref 0.0–0.2)

## 2018-08-05 LAB — ANGIOTENSIN CONVERTING ENZYME: Angiotensin-Converting Enzyme: 46 U/L (ref 14–82)

## 2018-08-05 LAB — FOLATE RBC
Folate, Hemolysate: 430 ng/mL
Folate, RBC: 1604 ng/mL (ref >498)
Hematocrit: 26.8 % — ABNORMAL LOW (ref 34.0–46.6)

## 2018-08-05 LAB — PARVOVIRUS B19 ANTIBODY, IGG AND IGM
Parovirus B19 IgG Abs: 1.8 index — ABNORMAL HIGH (ref 0.0–0.8)
Parovirus B19 IgM Abs: 0.1 index (ref 0.0–0.8)

## 2018-08-05 LAB — BASIC METABOLIC PANEL
Anion gap: 11 (ref 5–15)
BUN: 31 mg/dL — ABNORMAL HIGH (ref 6–20)
CO2: 12 mmol/L — ABNORMAL LOW (ref 22–32)
Calcium: 7.3 mg/dL — ABNORMAL LOW (ref 8.9–10.3)
Chloride: 109 mmol/L (ref 98–111)
Creatinine, Ser: 0.94 mg/dL (ref 0.44–1.00)
GFR calc Af Amer: >60 mL/min (ref >60)
GFR calc non Af Amer: >60 mL/min (ref >60)
Glucose, Bld: 163 mg/dL — ABNORMAL HIGH (ref 70–99)
Potassium: 4.4 mmol/L (ref 3.5–5.1)
Sodium: 132 mmol/L — ABNORMAL LOW (ref 135–145)

## 2018-08-05 LAB — GLUCOSE, CAPILLARY
Glucose-Capillary: 174 mg/dL — ABNORMAL HIGH (ref 70–99)
Glucose-Capillary: 217 mg/dL — ABNORMAL HIGH (ref 70–99)
Glucose-Capillary: 214 mg/dL — ABNORMAL HIGH (ref 70–99)
Glucose-Capillary: 235 mg/dL — ABNORMAL HIGH (ref 70–99)

## 2018-08-05 LAB — HISTOPLASMA ANTIGEN, URINE: Histoplasma Antigen, urine: <0.5 (ref <0.5)

## 2018-08-05 LAB — LACTATE DEHYDROGENASE: LDH: 232 U/L — ABNORMAL HIGH (ref 98–192)

## 2018-08-05 LAB — PATHOLOGIST SMEAR REVIEW

## 2018-08-05 LAB — PROCALCITONIN: Procalcitonin: 28.65 ng/mL

## 2018-08-05 LAB — SEDIMENTATION RATE: Sed Rate: >140 mm/hr — ABNORMAL HIGH (ref 0–22)

## 2018-08-05 MED ORDER — OXYCODONE-ACETAMINOPHEN 5-325 MG PO TABS
1.0000 | ORAL_TABLET | Freq: Four times a day (QID) | ORAL | Status: AC | PRN
  Administered 2018-08-05 – 2018-08-08 (×6): 1 via ORAL
  Filled 2018-08-05 (×6): qty 1

## 2018-08-05 NOTE — Progress Notes (Signed)
PROGRESS NOTE  Krystal Short ZOX:[id number] DOB: [birth year] DOA: 08/01/2018 PCP: Lajean Manes, MD  Brief History: Krystal Short  is a 33 y.o. female, former smoker (quit 12/2016), HIV 1+, HIV2-  CD4=10(06/05/2017), c/o cough (nonproductive) and dyspnea for the past 3 days. Pt notes fever.  Pt denies chills, cp, palp, n/v, abd pain, diarrhea, brbpr, dysuria, hematuria.  Pt presented for evaluation of cough/ dyspnea.   HPI/Recap of past 24 hours: Patient seen.  Fever has resolved.  T-max is 97.4.  Tachycardia 121 bpm noted. Blood pressure ranges from 89-138/69-81 mmHg. Respiratory symptoms seem to be improving.  Assessment/Plan: Active Problems:   Hyponatremia   AIDS (acquired immune deficiency syndrome) (HCC)   Molluscum contagiosum   Symptomatic anemia   Thrombocytopenia (HCC)   Pulmonary infiltrates   Fever   Sepsis (HCC)   Acute respiratory failure with hypoxemia (HCC)   Chest pain   AKI (acute kidney injury) ([place])  Acute hypoxic respiratory failure with fever , bilateral lung infiltrate, sepsis on presentation -SARS COV 2 screening negative, mrsa screening negative  -Respiratory viral panel + common cold virus (coronavirus 229E) -blood culture no growth so far -bordetella pertussis pcr in process -parvo b19 igg/igm in process -she recently underwent bronchoscopy on 6/5, biopsy" SCANT LUNG PARENCHYMA WITH FIBROSIS AND REACTIVE CHANGES. - NO GRANULOMATA IDENTIFIED." -concerning for pcp , sputum pcp smear in process, now on primaquine and clindamycin in addition to cefepime, zithromax, steroids,  ID/pulm/critical care consulted  Patient c/o 10/10 chest pain /back pain intermittent,  High sensitivity troponin unremarkable, CTA chest/ab negative for  dissection , no large PE Could it be acid reflux with esophageal spasm , she is on ppi twice a day, prn nitroglycerin  Prn morphin Reports improved 08/05/2018: Patient seems to be improving.  No new complaints reported.  We will  continue current management.  CT of the chest report is noted.  Hyponatremia, chronic: Etiology unclear.  Suspect SIADH.  Urine sodium and osmolality, as well as, serum osmolality done in April 2020 reviewed.  AKI: Resolved.  Metabolic acidosis: Non-anion gap acidosis No available ABG for complete analysis Currently on bicarb. Continue to monitor closely. Possibly related to HIV and associated illnesses.  Acute normocytic anemia/acute thrombocytopenia: S/p prbc transfusionx2 , hgb improved from 6 to 9, S/p plt x1 on 6/26  Hematology consulted, will follow recommendation 08/05/2018: Possibly related to HIV.  Elevated blood glucose: Continue to monitor closely.    HIV/AIDS CD4=10 06/05/2017 On biktarvy ID following   Code Status: full  Family Communication:   Disposition Plan: critically ill, remain in ICU/stepdown   Consultants:  Hematology  ID  Pulmonology/critical care  Procedures:  prbcx2 transfusion  plt x1 transfusion   Antibiotics:  IV cefepime  IV clindamycin   Objective: BP 104/69   Pulse (!) 121   Temp 98.4 F (36.9 C) (Oral)   Resp (!) 32   Ht 5\' 1"  (1.549 m)   Wt 56.7 kg   SpO2 94%   BMI 23.62 kg/m   Intake/Output Summary (Last 24 hours) at 08/05/2018 1015 Last data filed at 08/05/2018 0810 Gross per 24 hour  Intake 1157.55 ml  Output 1625 ml  Net -467.45 ml   Filed Weights   08/01/18 2226 08/02/18 0103  Weight: 56.7 kg 56.7 kg    Exam:  General: Awake and alert.  Not in any distress.  Cardiovascular: sinus tachycardia  Respiratory: Clear to auscultation anteriorly  Abdomen: Soft/ND/NT, positive BS  Musculoskeletal: Fullness of the extremities  Neuro: alert,  oriented   Data Reviewed: Basic Metabolic Panel: Recent Labs  Lab 08/02/18 1530 08/03/18 0819 08/04/18 0230 08/04/18 0237 08/04/18 1154 08/05/18 0710  NA 131* 131*  --  129* 133* 132*  K 4.7 4.6  --  4.7 4.3 4.4  CL 104 107  --  105 109 109  CO2  15* 14*  --  14* 14* 12*  GLUCOSE 245* 214*  --  124* 115* 163*  BUN 17 17  --  22* 25* 31*  CREATININE 0.98 1.03*  --  0.87 0.93 0.94  CALCIUM 6.9* 6.9*  --  7.2* 7.3* 7.3*  MG  --  1.9 1.9  --   --   --   PHOS  --   --  2.7  --   --   --    Liver Function Tests: Recent Labs  Lab 08/01/18 2227 08/02/18 1530  AST 44* 34  ALT 21 18  ALKPHOS 122 104  BILITOT 1.9* 1.1  PROT 6.9 6.3*  ALBUMIN 2.0* 1.9*   No results for input(s): LIPASE, AMYLASE in the last 168 hours. No results for input(s): AMMONIA in the last 168 hours. CBC: Recent Labs  Lab 08/01/18 2227 08/02/18 1000 08/02/18 2124 08/03/18 0819 08/04/18 0237 08/05/18 0710  WBC 16.0* 18.0*  --  15.3* 13.7* 12.0*  NEUTROABS 11.8*  --   --  13.3* 10.7* 8.8*  HGB 6.2* 9.3*  --  8.4* 8.6* 7.6*  HCT 19.5* 29.0*  --  27.6* 27.8* 24.9*  MCV 104.3* 95.7  --  97.2 96.9 98.0  PLT 22* 17* 28* 16* 17* 13*   Cardiac Enzymes:   No results for input(s): CKTOTAL, CKMB, CKMBINDEX, TROPONINI in the last 168 hours. BNP (last 3 results) Recent Labs    03/08/18 2129 08/02/18 1520  BNP 8.8 144.0*    ProBNP (last 3 results) No results for input(s): PROBNP in the last 8760 hours.  CBG: Recent Labs  Lab 08/04/18 0831 08/04/18 1244 08/04/18 1559 08/04/18 2138 08/05/18 0750  GLUCAP 114* 104* 117* 128* 174*    Recent Results (from the past 240 hour(s))  Novel Coronavirus, NAA ([facility])     Status: None   Collection Time: 07/30/18 10:46 AM  Result Value Ref Range Status   SARS-CoV-2, NAA Not Detected Not Detected Final    Comment: Testing was performed using the cobas(R) SARS-CoV-2 test. This test was developed and its performance characteristics determined by [employer]. This test has not been FDA cleared or approved. This test has been authorized by FDA under an Emergency Use Authorization (EUA). This test is only authorized for the duration of time the declaration that circumstances exist justifying the  authorization of the emergency use of in vitro diagnostic tests for detection of SARS-CoV-2 virus and/or diagnosis of COVID-19 infection under section 564(b)(1) of the Act, 21 U.S.C. 759FMB-8(G)(6), unless the authorization is terminated or revoked sooner. When diagnostic testing is negative, the possibility of a false negative result should be considered in the context of a patient's recent exposures and the presence of clinical signs and symptoms consistent with COVID-19. An individual without symptoms of COVID-19 and who is not shedding SARS-CoV-2 virus would expect to have a negati ve (not detected) result in this assay.   Blood Culture (routine x 2)     Status: None (Preliminary result)   Collection Time: 08/01/18 10:27 PM   Specimen: BLOOD  Result Value Ref Range Status   Specimen Description   Final    BLOOD SITE  NOT SPECIFIED Performed at [facility], [place] [address] [place], [place] [address]    Special Requests   Final    BOTTLES DRAWN AEROBIC AND ANAEROBIC Blood Culture adequate volume Performed at [facility] [address], [place], [place] [address]    Culture   Final    NO GROWTH 2 DAYS Performed at [facility] [address] [place], [place] [address]    Report Status PENDING  Incomplete  SARS Coronavirus 2 (CEPHEID- Performed in [facility] hospital lab), Hosp Order     Status: None   Collection Time: 08/01/18 10:28 PM   Specimen: Nasopharyngeal Swab  Result Value Ref Range Status   SARS Coronavirus 2 NEGATIVE NEGATIVE Final    Comment: (NOTE) If result is NEGATIVE SARS-CoV-2 target nucleic acids are NOT DETECTED. The SARS-CoV-2 RNA is generally detectable in upper and lower  respiratory specimens during the acute phase of infection. The lowest  concentration of SARS-CoV-2 viral copies this assay can detect is 250  copies / mL. A negative result does not preclude SARS-CoV-2 infection  and should not be used as the sole  basis for treatment or other  patient management decisions.  A negative result may occur with  improper specimen collection / handling, submission of specimen other  than nasopharyngeal swab, presence of viral mutation(s) within the  areas targeted by this assay, and inadequate number of viral copies  (<250 copies / mL). A negative result must be combined with clinical  observations, patient history, and epidemiological information. If result is POSITIVE SARS-CoV-2 target nucleic acids are DETECTED. The SARS-CoV-2 RNA is generally detectable in upper and lower  respiratory specimens dur ing the acute phase of infection.  Positive  results are indicative of active infection with SARS-CoV-2.  Clinical  correlation with patient history and other diagnostic information is  necessary to determine patient infection status.  Positive results do  not rule out bacterial infection or co-infection with other viruses. If result is PRESUMPTIVE POSTIVE SARS-CoV-2 nucleic acids MAY BE PRESENT.   A presumptive positive result was obtained on the submitted specimen  and confirmed on repeat testing.  While 2019 novel coronavirus  (SARS-CoV-2) nucleic acids may be present in the submitted sample  additional confirmatory testing may be necessary for epidemiological  and / or clinical management purposes  to differentiate between  SARS-CoV-2 and other Sarbecovirus currently known to infect humans.  If clinically indicated additional testing with an alternate test  methodology [phone]) is advised. The SARS-CoV-2 RNA is generally  detectable in upper and lower respiratory sp ecimens during the acute  phase of infection. The expected result is Negative. Fact Sheet for Patients:  StrictlyIdeas.no Fact Sheet for Healthcare Providers: BankingDealers.co.za This test is not yet approved or cleared by the [place] FDA and has been authorized for detection  and/or diagnosis of SARS-CoV-2 by FDA under an Emergency Use Authorization (EUA).  This EUA will remain in effect (meaning this test can be used) for the duration of the COVID-19 declaration under Section 564(b)(1) of the Act, 21 U.S.C. section 360bbb-3(b)(1), unless the authorization is terminated or revoked sooner. Performed at [facility], [place] [address] [place], [place] [address]   MRSA PCR Screening     Status: None   Collection Time: 08/02/18  3:11 AM   Specimen: Nasal Mucosa; Nasopharyngeal  Result Value Ref Range Status   MRSA by PCR NEGATIVE NEGATIVE Final    Comment:  The GeneXpert MRSA Assay (FDA approved for NASAL specimens only), is one component of a comprehensive MRSA colonization surveillance program. It is not intended to diagnose MRSA infection nor to guide or monitor treatment for MRSA infections. Performed at [facility], [place] [address] [place], [place] [address]   Blood Culture (routine x 2)     Status: None (Preliminary result)   Collection Time: 08/02/18 11:24 AM   Specimen: BLOOD LEFT ARM  Result Value Ref Range Status   Specimen Description   Final    BLOOD LEFT ARM Performed at [facility], [place] [address] [place], [place] [address]    Special Requests   Final    BOTTLES DRAWN AEROBIC AND ANAEROBIC Blood Culture adequate volume Performed at [facility] [address], [place], [place] [address]    Culture   Final    NO GROWTH 2 DAYS Performed at [facility] [address] [place], [place] [address]    Report Status PENDING  Incomplete  Respiratory Panel by PCR     Status: Abnormal   Collection Time: 08/02/18 12:19 PM   Specimen: Nasopharyngeal Swab; Respiratory  Result Value Ref Range Status   Adenovirus NOT DETECTED NOT DETECTED Final   Coronavirus 229E DETECTED (A) NOT DETECTED Final    Comment: (NOTE) The Coronavirus on the Respiratory Panel, DOES NOT  test for the novel  Coronavirus (2019 nCoV)    Coronavirus HKU1 NOT DETECTED NOT DETECTED Final   Coronavirus NL63 NOT DETECTED NOT DETECTED Final   Coronavirus OC43 NOT DETECTED NOT DETECTED Final   Metapneumovirus NOT DETECTED NOT DETECTED Final   Rhinovirus / Enterovirus NOT DETECTED NOT DETECTED Final   Influenza A NOT DETECTED NOT DETECTED Final   Influenza B NOT DETECTED NOT DETECTED Final   Parainfluenza Virus 1 NOT DETECTED NOT DETECTED Final   Parainfluenza Virus 2 NOT DETECTED NOT DETECTED Final   Parainfluenza Virus 3 NOT DETECTED NOT DETECTED Final   Parainfluenza Virus 4 NOT DETECTED NOT DETECTED Final   Respiratory Syncytial Virus NOT DETECTED NOT DETECTED Final   Bordetella pertussis NOT DETECTED NOT DETECTED Final   Chlamydophila pneumoniae NOT DETECTED NOT DETECTED Final   Mycoplasma pneumoniae NOT DETECTED NOT DETECTED Final    Comment: Performed at [facility], [place]. [address], [place], [place] [address]     Studies: No results found.  Scheduled Meds: . B-complex with vitamin C  1 tablet Oral Daily  . bictegravir-emtricitabine-tenofovir AF  1 tablet Oral Daily  . Chlorhexidine Gluconate Cloth  6 each Topical Daily  . dextromethorphan-guaiFENesin  2 tablet Oral BID  . folic acid  2 mg Oral Daily  . insulin aspart  0-9 Units Subcutaneous TID WC  . mouth rinse  15 mL Mouth Rinse BID  . methylPREDNISolone (SOLU-MEDROL) injection  40 mg Intravenous Q12H  .  morphine injection  1 mg Intravenous Once  . pantoprazole  40 mg Oral BID  . primaquine  30 mg Oral Daily  . sodium bicarbonate  650 mg Oral TID    Continuous Infusions: . ceFEPime (MAXIPIME) IV Stopped (08/05/18 0130)  . clindamycin (CLEOCIN) IV Stopped (08/05/18 0522)  . lactated ringers     And  . lactated ringers       Time spent: 35mins   I have personally reviewed and interpreted on  08/05/2018 daily labs, tele strips, imagings as discussed above under date review session and assessment  and plans.  I reviewed  all nursing notes, pharmacy notes, consultant notes,  vitals, pertinent old records  I have discussed plan of care as described above with RN , patient on 08/05/2018   [name] MD  [facility] If 7PM-7AM, please contact night-coverage at [email or web address], password [facility] 08/05/2018, 10:15 AM  LOS: 3 days

## 2018-08-05 NOTE — Progress Notes (Signed)
PULMONARY / CRITICAL CARE MEDICINE   NAME:  Krystal Short, MRN:  [id number], DOB:  03/25/1985, LOS: 3 ADMISSION DATE:  08/01/2018, CONSULTATION DATE:  08/02/2018 REFERRING MD:  [facility], CHIEF COMPLAINT:  Sob/cough  BRIEF HISTORY:    33 yobf quit smoking 12/2016 with HIV presented with new sob evolving to moslty dry cough since 07/27/18 assoc with fever, aches in setting of most recent admit 5-28 to 07/16/18 with bordetella cultures on fob and rx with zmax and "all better" = back to nl activity level s need for 02 or saba then gradually worse sob x 6/20 s rigors, pleuritic cp, n or v or diarrhea > televist with pulm NP 6/23 rec zpak/ pred > no better so to ER 6/25 with   cxr c/w new as dz bilaterally so pccm consulted pm 08/02/18.    Prev w/u for sob 06/18/18 in pulmonary offfice  DOE (dyspnea on exertion) Onset April 2019 with dx of HIV and pulmonary infiltrates c/w PCP  - 06/18/2018   Walked RA  2 laps @  approx 277f each @ slow/mod pace  stopped due to  Light headed > sob, sats 100%   Serial chest x-rays were reviewed and are consistent with resolving opportunistic infection most likely PCP with a normal exam and the expectation that she will gradually improve.  Note however she is still anemic and her main complaint is more related to that (fatigue, lightheaded with activity). rec finish rx per ID and f/u in 3 weeks > did not return to pulmonary office as admitted with flare of same symptoms Denies vapes or IV/inhaled drug exp  6/5- bronch bx RUL - non diagnostic   has a past medical history of Acute hyponatremia (06/03/2017), Anemia, CAP (community acquired pneumonia) (06/03/2017), Community acquired pneumonia (06/05/2017), Facial dermatitis (06/03/2017), HIV (human immunodeficiency virus infection) ([place], Pleurisy (06/03/2017), Pneumonia (06/06/2017), 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 ([place], Thrush of mouth and esophagus ([place], and UTI (urinary tract infection).   has a  past surgical history that includes No past surgeries; Endobronchial ultrasound (N/A, 07/12/2018); Video bronchoscopy (N/A, 07/12/2018); Fine Needle Aspiration Biopsy (07/12/2018); biopsy (07/12/2018); and Bronchial washings (07/12/2018).   SIGNIFICANT EVENTS:   6/28 -Much better cough, min white mucus / still sob with activity but now sats ok at rest RA.   STUDIES:   CTa 6/26: neg pe/ bilateral GG as dz  wotj adenopathy/ splenomegaly   CULTURES:  COVID 19  6/25 neg  BC x 2  6/25  Resp panel 6/26  Bordetllea PCR  6/26 MRSA screen 6/26 neg  Resp PCR 6/26  Pos coronavirus 229E Pneumocystis 6/26  >>>   ANTIBIOTICS per ID :  Vanc 6/25 -d/c 6/26 so got one dose only  Bicegravir 6/25 Maxepime 6/25 zmax  6/26  Clinda 6/26 Primaquine 6/26   LINES/TUBES:     CONSULTANTS:   ID  6/26 PCCM 6/26    SUBJECTIVE:   6/29 - ESR sitlll high but says better than yesterday. Down to 3L [place]. She thinks she has sarcoid - skin bx 2019 does not show that dx. Tbbx bx 07/12/2018 does not show that tdx   6/29 - Results for GMCKINLEIGH, SCHUCHART(MRN [id number] as of 08/05/2018 08:55  Ref. Range 06/03/2017 23:20 08/02/2018 11:24 08/05/2018 07:10  Sed Rate Latest Ref Range: 0 - 22 mm/hr 130 (H) 140 (H) >140 (H)    CONSTITUTIONAL: BP 115/72   Pulse (!) 129   Temp 98.4 F (36.9 C) (Oral)  Resp (!) 26   Ht _0  (1.549 m)   Wt 56.7 kg   SpO2 92%   BMI 23.62 kg/m    On ? FIO2 > RA on my exam   I/O last 3 completed shifts: In: 1554.5 [P.O.:720; IV Piggyback:834.5] Out: 1425 [Urine:1425]       General Appearance:  Looks stable Head:  Normocephalic, without obvious abnormality, atraumatic Eyes:  PERRL - ye, conjunctiva/corneas - mudd     Ears:  Normal external ear canals, both ears Nose:  G tube - no but has [place] O2 at 3 LNC Throat:  ETT TUBE - no , OG tube - no Neck:  Supple,  No enlargement/tenderness/nodules Lungs: Clear to auscultation bilaterally, Heart:  S1 and S2 normal, no murmur, CVP - no.   Pressors - no Abdomen:  Soft, no masses, no organomegaly Genitalia / Rectal:  Not done Extremities:  Extremities- intact Skin:  ntact in exposed areas . Sacral area - not examined Neurologic:  Sedation - none -> RASS - +1 . Moves all 4s - yes. CAM-ICU - neg . Orientation - x3+   LABS    PULMONARY No results for input(s): PHART, PCO2ART, PO2ART, HCO3, TCO2, O2SAT in the last 168 hours.  Invalid input(s): PCO2, PO2  CBC Recent Labs  Lab 08/02/18 1000 08/02/18 2124 08/03/18 0819 08/04/18 0237  HGB 9.3*  --  8.4* 8.6*  HCT 29.0*  --  27.6* 27.8*  WBC 18.0*  --  15.3* 13.7*  PLT 17* 28* 16* 17*    COAGULATION No results for input(s): INR in the last 168 hours.  CARDIAC  No results for input(s): TROPONINI in the last 168 hours. No results for input(s): PROBNP in the last 168 hours.   CHEMISTRY Recent Labs  Lab 08/02/18 1530 08/03/18 0819 08/04/18 0230 08/04/18 0237 08/04/18 1154 08/05/18 0710  NA 131* 131*  --  129* 133* 132*  K 4.7 4.6  --  4.7 4.3 4.4  CL 104 107  --  105 109 109  CO2 15* 14*  --  14* 14* 12*  GLUCOSE 245* 214*  --  124* 115* 163*  BUN 17 17  --  22* 25* 31*  CREATININE 0.98 1.03*  --  0.87 0.93 0.94  CALCIUM 6.9* 6.9*  --  7.2* 7.3* 7.3*  MG  --  1.9 1.9  --   --   --   PHOS  --   --  2.7  --   --   --    Estimated Creatinine Clearance: 65.4 mL/min (by C-G formula based on SCr of 0.94 mg/dL).   LIVER Recent Labs  Lab 08/01/18 2227 08/02/18 1530  AST 44* 34  ALT 21 18  ALKPHOS 122 104  BILITOT 1.9* 1.1  PROT 6.9 6.3*  ALBUMIN 2.0* 1.9*     INFECTIOUS Recent Labs  Lab 08/03/18 0612 08/03/18 0819 08/04/18 1301  LATICACIDVEN 1.7 4.4* 2.0*     ENDOCRINE CBG (last 3)  Recent Labs    08/04/18 1559 08/04/18 2138 08/05/18 0750  GLUCAP 117* 128* 174*         IMAGING x48h  - image(s) personally visualized  -   highlighted in bold No results found.    RESOLVED PROBLEM LIST   ASSESSMENT AND PLAN   1) Acute resp  failure in pt with very low CD4 and diffuse infiltrates / LD 487 is c/w PCP pna and doubt Bordetella has anything to do with this or any form of chronic  progressive  ILD as was so much better at baseline prior to acutely worse 6/20  Miscellaneous:Alv microlithiasis, alv proteinosis, asp, bronchiectais, BOOP   ARDS/ AIP Occupational dz/ HSP Neoplasm Infection - Dr Campbell/ Hatcher's  note/interventions reviewed, agree with broad empirical rx  Drug - none of the usual suspects Pulmonary emboli, Protein disorders Edema - unlikely with bnp 388 8/75 Eosinophilic dz - no Eos on peripheral smear 08/01/18 and on bal 07/12/18 Sarcoidosis - neg tbbx and bus 07/12/2018 > makes sarcoid very unlikely  Connective tissue dz assoc with high ESR Hist X / Hemorrhage esp in pt with such low plts but so far no hemoptysis and no hematuria or AKI to suggest pumonary renal syndrome  Idiopathic     08/05/2018 - some better subjectively . Objectively on 3L [place] - acute resp failure with infiltrates of unclear but waxing and waning etiology   PLAN - continue o2 - continue abx per ID as below - continue solumedrol - get autoimmune and vasculitis profile 08/05/2018 - track CXR 08/06/18  Can go to floor per [facility]         Antibiotics Given (last 72 hours)    Date/Time Action Medication Dose Rate   08/02/18 0945 Given   azithromycin (ZITHROMAX) tablet 250 mg 250 mg    08/02/18 0945 Given   bictegravir-emtricitabine-tenofovir AF (BIKTARVY) 50-200-25 MG per tablet 1 tablet 1 tablet    08/02/18 1000 New Bag/Given   ceFEPIme (MAXIPIME) 2 g in sodium chloride 0.9 % 100 mL IVPB 2 g 200 mL/hr   08/02/18 1629 New Bag/Given   clindamycin (CLEOCIN) IVPB 900 mg 900 mg 100 mL/hr   08/02/18 1934 Given   primaquine tablet 30 mg 30 mg    08/02/18 2139 New Bag/Given   clindamycin (CLEOCIN) IVPB 900 mg 900 mg 100 mL/hr   08/02/18 2251 New Bag/Given   ceFEPIme (MAXIPIME) 2 g in sodium chloride 0.9 % 100 mL IVPB 2 g 200  mL/hr   08/03/18 0527 New Bag/Given   clindamycin (CLEOCIN) IVPB 900 mg 900 mg 100 mL/hr   08/03/18 1016 New Bag/Given   ceFEPIme (MAXIPIME) 2 g in sodium chloride 0.9 % 100 mL IVPB 2 g 200 mL/hr   08/03/18 1017 Given   primaquine tablet 30 mg 30 mg    08/03/18 1018 Given   azithromycin (ZITHROMAX) tablet 250 mg 250 mg    08/03/18 1018 Given   bictegravir-emtricitabine-tenofovir AF (BIKTARVY) 50-200-25 MG per tablet 1 tablet 1 tablet    08/03/18 1531 New Bag/Given   clindamycin (CLEOCIN) IVPB 900 mg 900 mg 100 mL/hr   08/03/18 2200 New Bag/Given   ceFEPIme (MAXIPIME) 2 g in sodium chloride 0.9 % 100 mL IVPB 2 g 200 mL/hr   08/03/18 2252 New Bag/Given   clindamycin (CLEOCIN) IVPB 900 mg 900 mg 100 mL/hr   08/04/18 0517 New Bag/Given   clindamycin (CLEOCIN) IVPB 900 mg 900 mg 100 mL/hr   08/04/18 7972 Given   primaquine tablet 30 mg 30 mg    08/04/18 0922 Given   bictegravir-emtricitabine-tenofovir AF (BIKTARVY) 50-200-25 MG per tablet 1 tablet 1 tablet    08/04/18 1002 New Bag/Given   ceFEPIme (MAXIPIME) 2 g in sodium chloride 0.9 % 100 mL IVPB 2 g 200 mL/hr   08/04/18 1315 New Bag/Given   clindamycin (CLEOCIN) IVPB 900 mg 900 mg 100 mL/hr   08/04/18 1704 New Bag/Given   ceFEPIme (MAXIPIME) 2 g in sodium chloride 0.9 % 100 mL IVPB 2 g 200 mL/hr  08/04/18 2312 New Bag/Given   clindamycin (CLEOCIN) IVPB 900 mg 900 mg 100 mL/hr   08/05/18 0129 New Bag/Given   ceFEPIme (MAXIPIME) 2 g in sodium chloride 0.9 % 100 mL IVPB 2 g 200 mL/hr   08/05/18 0514 New Bag/Given   clindamycin (CLEOCIN) IVPB 900 mg 900 mg 100 mL/hr         SIGNATURE    Dr. Brand Males, M.D., F.C.C.P,  Pulmonary and Critical Care Medicine Staff Physician, [place] Director - Interstitial Lung Disease  Program  Pulmonary [place] at [place], [place], [address]  Pager: [phone], If no answer or between  15:00h - 7:00h: call [phone] Telephone: [phone]  9:25 AM 08/05/2018

## 2018-08-05 NOTE — Progress Notes (Addendum)
Patient ID: Krystal Short, female   DOB: 15-Feb-1985, 33 y.o.   MRN: [id number]         [facility] for Infectious Disease  Date of Admission:  08/01/2018           Day 4 clindamycin        Day 4 primaquine        Day 4 cefepime ASSESSMENT: We still do not know the cause of her recurrent pneumonitis.  Testing for pneumocystis is pending. She has never had previous confirmation of pneumocystis pneumonia and I still believe that it is unlikely to develop opportunistic infections when there is consistent and complete HIV viral suppression, even if the CD4 remains low.  Common coronavirus (not COVID-19) was detected on respiratory virus panel but I doubt that that is the cause of her acute illness.  I am not sure if she is better because of empiric antibiotics, increasing her steroids or both.  PLAN: 1. Continue clindamycin and primaquine 2. Continue cefepime for now 3. Continue Biktarvy 4. Await results of pneumocystis testing  Active Problems:   Pulmonary infiltrates   Fever   Symptomatic anemia   Thrombocytopenia (HCC)   Hyponatremia   AIDS (acquired immune deficiency syndrome) (HCC)   Molluscum contagiosum   Sepsis (HCC)   Acute respiratory failure with hypoxemia (HCC)   Chest pain   AKI (acute kidney injury) ([place])   Scheduled Meds: . B-complex with vitamin C  1 tablet Oral Daily  . bictegravir-emtricitabine-tenofovir AF  1 tablet Oral Daily  . Chlorhexidine Gluconate Cloth  6 each Topical Daily  . dextromethorphan-guaiFENesin  2 tablet Oral BID  . folic acid  2 mg Oral Daily  . insulin aspart  0-9 Units Subcutaneous TID WC  . mouth rinse  15 mL Mouth Rinse BID  . methylPREDNISolone (SOLU-MEDROL) injection  40 mg Intravenous Q12H  .  morphine injection  1 mg Intravenous Once  . pantoprazole  40 mg Oral BID  . primaquine  30 mg Oral Daily  . sodium bicarbonate  650 mg Oral TID   Continuous Infusions: . ceFEPime (MAXIPIME) IV Stopped (08/05/18 1132)  . clindamycin  (CLEOCIN) IV Stopped (08/05/18 1422)  . lactated ringers     And  . lactated ringers     PRN Meds:.benzonatate, morphine injection, nitroGLYCERIN, ondansetron (ZOFRAN) IV, oxyCODONE-acetaminophen   SUBJECTIVE: Krystal Short is feeling better today.  She is not feeling short of breath.  She is not coughing as much.  Review of Systems: Review of Systems  Constitutional: Positive for malaise/fatigue. Negative for chills, diaphoresis and fever.  Respiratory: Positive for cough, sputum production and shortness of breath.   Cardiovascular: Negative for chest pain.  Musculoskeletal: Positive for back pain.    Allergies  Allergen Reactions  . Heparin Other (See Comments)    Unknown reaction    OBJECTIVE: Vitals:   08/05/18 1135 08/05/18 1200 08/05/18 1300 08/05/18 1400  BP:  113/83 109/85 122/83  Pulse:  (!) 124 (!) 126   Resp:  (!) 30 (!) 28 (!) 33  Temp: 98 F (36.7 C)     TempSrc: Axillary     SpO2:  93% 95%   Weight:      Height:       Body mass index is 23.62 kg/m.  Physical Exam Constitutional:      Comments: She is resting quietly in her recliner.  Cardiovascular:     Rate and Rhythm: Regular rhythm.     Heart sounds: No murmur.  Comments: She is tachycardic Pulmonary:     Effort: Pulmonary effort is normal.     Breath sounds: No wheezing or rales.  Psychiatric:        Mood and Affect: Mood normal.     Lab Results Lab Results  Component Value Date   WBC 12.0 (H) 08/05/2018   HGB 7.6 (L) 08/05/2018   HCT 24.9 (L) 08/05/2018   MCV 98.0 08/05/2018   PLT 13 (LL) 08/05/2018    Lab Results  Component Value Date   CREATININE 0.94 08/05/2018   BUN 31 (H) 08/05/2018   NA 132 (L) 08/05/2018   K 4.4 08/05/2018   CL 109 08/05/2018   CO2 12 (L) 08/05/2018    Lab Results  Component Value Date   ALT 18 08/02/2018   AST 34 08/02/2018   ALKPHOS 104 08/02/2018   BILITOT 1.1 08/02/2018     Microbiology: Recent Results (from the past 240 hour(s))  Novel  Coronavirus, NAA ([facility])     Status: None   Collection Time: 07/30/18 10:46 AM  Result Value Ref Range Status   SARS-CoV-2, NAA Not Detected Not Detected Final    Comment: Testing was performed using the cobas(R) SARS-CoV-2 test. This test was developed and its performance characteristics determined by [employer]. This test has not been FDA cleared or approved. This test has been authorized by FDA under an Emergency Use Authorization (EUA). This test is only authorized for the duration of time the declaration that circumstances exist justifying the authorization of the emergency use of in vitro diagnostic tests for detection of SARS-CoV-2 virus and/or diagnosis of COVID-19 infection under section 564(b)(1) of the Act, 21 U.S.C. 937JIR-6(V)(8), unless the authorization is terminated or revoked sooner. When diagnostic testing is negative, the possibility of a false negative result should be considered in the context of a patient's recent exposures and the presence of clinical signs and symptoms consistent with COVID-19. An individual without symptoms of COVID-19 and who is not shedding SARS-CoV-2 virus would expect to have a negati ve (not detected) result in this assay.   Blood Culture (routine x 2)     Status: None (Preliminary result)   Collection Time: 08/01/18 10:27 PM   Specimen: BLOOD  Result Value Ref Range Status   Specimen Description   Final    BLOOD SITE NOT SPECIFIED Performed at [facility], [address] [place], [place] [address]    Special Requests   Final    BOTTLES DRAWN AEROBIC AND ANAEROBIC Blood Culture adequate volume Performed at [facility] [address], [place], [place] [address]    Culture   Final    NO GROWTH 3 DAYS Performed at [facility], [place] [address], [place], [place] [address]    Report Status PENDING  Incomplete  Fungus culture, blood     Status: None (Preliminary result)   Collection Time:  08/01/18 10:27 PM   Specimen: BLOOD  Result Value Ref Range Status   Specimen Description   Final    BLOOD SITE NOT SPECIFIED Performed at [facility] [address] [place], [place] [address]    Special Requests   Final    BOTTLES DRAWN AEROBIC AND ANAEROBIC Blood Culture adequate volume Performed at [facility] [address], [place], [place] [address]    Culture   Final    NO GROWTH 3 DAYS Performed at [facility], [place] [address], [place], [place] [address]  Report Status PENDING  Incomplete  SARS Coronavirus 2 (CEPHEID- Performed in [place] hospital lab), Hosp Order     Status: None   Collection Time: 08/01/18 10:28 PM   Specimen: Nasopharyngeal Swab  Result Value Ref Range Status   SARS Coronavirus 2 NEGATIVE NEGATIVE Final    Comment: (NOTE) If result is NEGATIVE SARS-CoV-2 target nucleic acids are NOT DETECTED. The SARS-CoV-2 RNA is generally detectable in upper and lower  respiratory specimens during the acute phase of infection. The lowest  concentration of SARS-CoV-2 viral copies this assay can detect is 250  copies / mL. A negative result does not preclude SARS-CoV-2 infection  and should not be used as the sole basis for treatment or other  patient management decisions.  A negative result may occur with  improper specimen collection / handling, submission of specimen other  than nasopharyngeal swab, presence of viral mutation(s) within the  areas targeted by this assay, and inadequate number of viral copies  (<250 copies / mL). A negative result must be combined with clinical  observations, patient history, and epidemiological information. If result is POSITIVE SARS-CoV-2 target nucleic acids are DETECTED. The SARS-CoV-2 RNA is generally detectable in upper and lower  respiratory specimens dur ing the acute phase of infection.  Positive  results are indicative of active infection with SARS-CoV-2.  Clinical   correlation with patient history and other diagnostic information is  necessary to determine patient infection status.  Positive results do  not rule out bacterial infection or co-infection with other viruses. If result is PRESUMPTIVE POSTIVE SARS-CoV-2 nucleic acids MAY BE PRESENT.   A presumptive positive result was obtained on the submitted specimen  and confirmed on repeat testing.  While 2019 novel coronavirus  (SARS-CoV-2) nucleic acids may be present in the submitted sample  additional confirmatory testing may be necessary for epidemiological  and / or clinical management purposes  to differentiate between  SARS-CoV-2 and other Sarbecovirus currently known to infect humans.  If clinically indicated additional testing with an alternate test  methodology [phone]) is advised. The SARS-CoV-2 RNA is generally  detectable in upper and lower respiratory sp ecimens during the acute  phase of infection. The expected result is Negative. Fact Sheet for Patients:  StrictlyIdeas.no Fact Sheet for Healthcare Providers: BankingDealers.co.za This test is not yet approved or cleared by the [place] FDA and has been authorized for detection and/or diagnosis of SARS-CoV-2 by FDA under an Emergency Use Authorization (EUA).  This EUA will remain in effect (meaning this test can be used) for the duration of the COVID-19 declaration under Section 564(b)(1) of the Act, 21 U.S.C. section 360bbb-3(b)(1), unless the authorization is terminated or revoked sooner. Performed at [facility], [place] [address] [place], [place] [address]   MRSA PCR Screening     Status: None   Collection Time: 08/02/18  3:11 AM   Specimen: Nasal Mucosa; Nasopharyngeal  Result Value Ref Range Status   MRSA by PCR NEGATIVE NEGATIVE Final    Comment:        The GeneXpert MRSA Assay (FDA approved for NASAL specimens only), is one component of a  comprehensive MRSA colonization surveillance program. It is not intended to diagnose MRSA infection nor to guide or monitor treatment for MRSA infections. Performed at [facility], [place] [address] [place], [place] [address]   Blood Culture (routine x 2)     Status: None (Preliminary result)   Collection Time: 08/02/18 11:24 AM   Specimen:  BLOOD LEFT ARM  Result Value Ref Range Status   Specimen Description   Final    BLOOD LEFT ARM Performed at [facility], [address] [place], [place] [address]    Special Requests   Final    BOTTLES DRAWN AEROBIC AND ANAEROBIC Blood Culture adequate volume Performed at [facility] [address] [place], [place] [address]    Culture   Final    NO GROWTH 3 DAYS Performed at [facility], [place] [address], [place], [place] [address]    Report Status PENDING  Incomplete  Respiratory Panel by PCR     Status: Abnormal   Collection Time: 08/02/18 12:19 PM   Specimen: Nasopharyngeal Swab; Respiratory  Result Value Ref Range Status   Adenovirus NOT DETECTED NOT DETECTED Final   Coronavirus 229E DETECTED (A) NOT DETECTED Final    Comment: (NOTE) The Coronavirus on the Respiratory Panel, DOES NOT test for the novel  Coronavirus (2019 nCoV)    Coronavirus HKU1 NOT DETECTED NOT DETECTED Final   Coronavirus NL63 NOT DETECTED NOT DETECTED Final   Coronavirus OC43 NOT DETECTED NOT DETECTED Final   Metapneumovirus NOT DETECTED NOT DETECTED Final   Rhinovirus / Enterovirus NOT DETECTED NOT DETECTED Final   Influenza A NOT DETECTED NOT DETECTED Final   Influenza B NOT DETECTED NOT DETECTED Final   Parainfluenza Virus 1 NOT DETECTED NOT DETECTED Final   Parainfluenza Virus 2 NOT DETECTED NOT DETECTED Final   Parainfluenza Virus 3 NOT DETECTED NOT DETECTED Final   Parainfluenza Virus 4 NOT DETECTED NOT DETECTED Final   Respiratory Syncytial Virus NOT DETECTED NOT DETECTED Final   Bordetella  pertussis NOT DETECTED NOT DETECTED Final   Chlamydophila pneumoniae NOT DETECTED NOT DETECTED Final   Mycoplasma pneumoniae NOT DETECTED NOT DETECTED Final    Comment: Performed at [facility], [place]. [address] [place], [place] [address]    Michel Bickers, [place] [facility] [phone] pager   [phone] cell 08/05/2018, 3:42 PM

## 2018-08-05 NOTE — Progress Notes (Signed)
HEMATOLOGY-ONCOLOGY PROGRESS NOTE  SUBJECTIVE: The patient reports that she feels better.  Her breathing has improved.  Cough is also improving.  She has remained afebrile for the past 24 hours.  Denies bleeding.  REVIEW OF SYSTEMS:   Constitutional: Denies fevers, chills or abnormal weight loss. Still having fatigue.  Eyes: Denies blurriness of vision Ears, nose, mouth, throat, and face: Denies mucositis or sore throat Respiratory: Cough and shortness of breath are improving.  Cardiovascular: Denies palpitation, chest discomfort Gastrointestinal:  Denies nausea, heartburn or change in bowel habits Skin: Denies abnormal skin rashes Lymphatics: Denies new lymphadenopathy or easy bruising Neurological:Denies numbness, tingling or new weaknesses Behavioral/Psych: Mood is stable, no new changes  Extremities: No lower extremity edema All other systems were reviewed with the patient and are negative.  I have reviewed the past medical history, past surgical history, social history and family history with the patient and they are unchanged from previous note.   PHYSICAL EXAMINATION:  Vitals:   08/05/18 0900 08/05/18 1000  BP: 104/69 107/80  Pulse: (!) 121 (!) 121  Resp: (!) 32 (!) 31  Temp:    SpO2: 94% 93%   Filed Weights   08/01/18 2226 08/02/18 0103  Weight: 125 lb (56.7 kg) 125 lb (56.7 kg)    Intake/Output from previous day: 06/28 0701 - 06/29 0700 In: 1157.6 [P.O.:720; IV Piggyback:437.6] Out: 1425 [Urine:1425]  GENERAL:alert, no distress and comfortable SKIN: skin color, texture, turgor are normal, no rashes or significant lesions EYES: normal, Conjunctiva are pink and non-injected, sclera clear OROPHARYNX:no exudate, no erythema and lips, buccal mucosa, and tongue normal  NECK: supple, thyroid normal size, non-tender, without nodularity LYMPH:  no palpable lymphadenopathy in the cervical, axillary or inguinal LUNGS: clear to auscultation and percussion with normal  breathing effort HEART: regular rate & rhythm and no murmurs and no lower extremity edema ABDOMEN:abdomen soft, non-tender and normal bowel sounds Musculoskeletal:no cyanosis of digits and no clubbing  NEURO: alert & oriented x 3 with fluent speech, no focal motor/sensory deficits  LABORATORY DATA:  I have reviewed the data as listed CMP Latest Ref Rng & Units 08/05/2018 08/04/2018 08/04/2018  Glucose 70 - 99 mg/dL 163(H) 115(H) 124(H)  BUN 6 - 20 mg/dL 31(H) 25(H) 22(H)  Creatinine 0.44 - 1.00 mg/dL 0.94 0.93 0.87  Sodium 135 - 145 mmol/L 132(L) 133(L) 129(L)  Potassium 3.5 - 5.1 mmol/L 4.4 4.3 4.7  Chloride 98 - 111 mmol/L 109 109 105  CO2 22 - 32 mmol/L 12(L) 14(L) 14(L)  Calcium 8.9 - 10.3 mg/dL 7.3(L) 7.3(L) 7.2(L)  Total Protein 6.5 - 8.1 g/dL - - -  Total Bilirubin 0.3 - 1.2 mg/dL - - -  Alkaline Phos 38 - 126 U/L - - -  AST 15 - 41 U/L - - -  ALT 0 - 44 U/L - - -    Lab Results  Component Value Date   WBC 12.0 (H) 08/05/2018   HGB 7.6 (L) 08/05/2018   HCT 24.9 (L) 08/05/2018   MCV 98.0 08/05/2018   PLT 13 (LL) 08/05/2018   NEUTROABS 8.8 (H) 08/05/2018    Ct Abdomen Pelvis W Contrast  Result Date: 07/11/2018 CLINICAL DATA:  Hepatomegaly.  Patient has no current complaints. EXAM: CT ABDOMEN AND PELVIS WITH CONTRAST TECHNIQUE: Multidetector CT imaging of the abdomen and pelvis was performed using the standard protocol following bolus administration of intravenous contrast. CONTRAST:  110m OMNIPAQUE IOHEXOL 300 MG/ML SOLN, 3108mOMNIPAQUE IOHEXOL 300 MG/ML SOLN COMPARISON:  CT chest dated  Jul 04, 2018. FINDINGS: Lower chest: Improving septal thickening and ground-glass density at the lung bases. Resolved pleural effusions. Hepatobiliary: Mild hepatomegaly. No focal liver abnormality. The gallbladder is unremarkable. No biliary dilatation. Pancreas: Unremarkable. No pancreatic ductal dilatation or surrounding inflammatory changes. Spleen: Mild splenomegaly.  No focal abnormality.  Adrenals/Urinary Tract: Adrenal glands are unremarkable. Kidneys are normal, without renal calculi, focal lesion, or hydronephrosis. Bladder is unremarkable. Stomach/Bowel: Small hiatal hernia. The stomach is otherwise within normal limits. No bowel wall thickening, distention, or surrounding inflammatory changes. Normal appendix. Vascular/Lymphatic: No significant vascular findings are present. No enlarged abdominal or pelvic lymph nodes. Reproductive: Uterus and bilateral adnexa are unremarkable. Tampon in the vagina. Other: Trace free fluid in the pelvis is likely physiologic. No pneumoperitoneum. Musculoskeletal: No acute or significant osseous findings. Degenerative disc disease at L5-S1. IMPRESSION: 1. Mild hepatosplenomegaly.  No focal abnormality. 2. Improving septal thickening and ground-glass density at the lung bases, favor resolving atypical infection given history of HIV. Electronically Signed   By: Titus Dubin M.D.   On: 07/11/2018 18:16   Dg Chest Port 1 View  Result Date: 08/02/2018 CLINICAL DATA:  Pt reported center chest pain and SOB that started a few minutes ago. EXAM: PORTABLE CHEST - 1 VIEW COMPARISON:  none FINDINGS: Worsening bilateral reticulonodular ground-glass opacities. Heart size and mediastinal contours are within normal limits. No effusion. No pneumothorax. Visualized bones unremarkable. IMPRESSION: Continued worsening of bilateral reticulonodular ground-glass opacities. Electronically Signed   By: Lucrezia Europe M.D.   On: 08/02/2018 15:51   Dg Chest Port 1 View  Result Date: 08/02/2018 CLINICAL DATA:  Fever and hypoxia EXAM: PORTABLE CHEST 1 VIEW COMPARISON:  07/12/2018, 06/18/2018, CT chest 07/05/2018 FINDINGS: Bilateral reticular and ground-glass opacity, increased compared to prior. Normal heart size. No pneumothorax. IMPRESSION: Interval worsening of bilateral reticular and ground-glass infiltrates since radiograph 07/12/2018. Electronically Signed   By: Donavan Foil  M.D.   On: 08/02/2018 00:03   Dg Chest Port 1 View  Result Date: 07/12/2018 CLINICAL DATA:  Status post endobronchial ultrasound in aspiration. EXAM: PORTABLE CHEST 1 VIEW COMPARISON:  Radiograph of Jul 04, 2018. FINDINGS: The heart size and mediastinal contours are within normal limits. Both lungs are clear. No pneumothorax or pleural effusion is noted. The visualized skeletal structures are unremarkable. IMPRESSION: No active disease. Electronically Signed   By: Marijo Conception M.D.   On: 07/12/2018 15:22   Ct Angio Chest/abd/pel For Dissection W And/or W/wo  Result Date: 08/02/2018 CLINICAL DATA:  Back pain EXAM: CT ANGIOGRAPHY CHEST, ABDOMEN AND PELVIS TECHNIQUE: Multidetector CT imaging through the chest, abdomen and pelvis was performed using the standard protocol during bolus administration of intravenous contrast. Multiplanar reconstructed images and MIPs were obtained and reviewed to evaluate the vascular anatomy. CONTRAST:  189m OMNIPAQUE IOHEXOL 350 MG/ML SOLN COMPARISON:  CT chest dated Jul 04, 2018 FINDINGS: CTA CHEST FINDINGS Cardiovascular: Evaluation for pulmonary emboli is limited by respiratory motion artifact. Given this limitation, there is no large centrally located pulmonary embolus. There is no definite dissection. The heart size is normal. There is no large pericardial effusion. The intracardiac blood pool is hypodense relative to the adjacent myocardium consistent with anemia. Mediastinum/Nodes: There are enlarged mediastinal and hilar lymph nodes. For example there is a right paratracheal lymph node measuring approximately 1.4 cm. The thyroid gland is unremarkable. There is mild supraclavicular adenopathy. Lungs/Pleura: There are diffuse ground-glass airspace opacities bilaterally with prominent interlobular septal thickening. There are trace bilateral pleural effusions. There is no  pneumothorax. The trachea is unremarkable. Musculoskeletal: No chest wall abnormality. No acute or  significant osseous findings. Review of the MIP images confirms the above findings. CTA ABDOMEN AND PELVIS FINDINGS VASCULAR Aorta: Normal caliber aorta without aneurysm, dissection, vasculitis or significant stenosis. Celiac: Patent without evidence of aneurysm, dissection, vasculitis or significant stenosis. SMA: Patent without evidence of aneurysm, dissection, vasculitis or significant stenosis. Renals: Both renal arteries are patent without evidence of aneurysm, dissection, vasculitis, fibromuscular dysplasia or significant stenosis. IMA: Patent without evidence of aneurysm, dissection, vasculitis or significant stenosis. Inflow: Patent without evidence of aneurysm, dissection, vasculitis or significant stenosis. Veins: No obvious venous abnormality within the limitations of this arterial phase study. Review of the MIP images confirms the above findings. NON-VASCULAR Hepatobiliary: There is hepatic steatosis. The gallbladder is unremarkable. Pancreas: Unremarkable. No pancreatic ductal dilatation or surrounding inflammatory changes. Spleen: The spleen is significantly enlarged. Adrenals/Urinary Tract: Adrenal glands are unremarkable. Kidneys are normal, without renal calculi, focal lesion, or hydronephrosis. Bladder is unremarkable. Stomach/Bowel: Stomach is within normal limits. Appendix appears normal. No evidence of bowel wall thickening, distention, or inflammatory changes. Lymphatic: There is scattered prominent retroperitoneal and mesenteric lymph nodes. There are prominent inguinal lymph nodes. Reproductive: Uterus and bilateral adnexa are unremarkable. Other: There is mild body wall edema. There is a small volume of free fluid in the pelvis. Musculoskeletal: No acute or significant osseous findings. Review of the MIP images confirms the above findings. IMPRESSION: 1. No evidence of a dissection. 2. Evaluation for pulmonary emboli is limited by motion artifact. Given this limitation there is no large  centrally located pulmonary embolus. 3. Again noted are diffuse bilateral ground-glass airspace opacities most likely representing an atypical infectious process. Sarcoidosis can have a similar appearance in the appropriate clinical setting. 4. Trace bilateral pleural effusions. 5. Scattered adenopathy throughout the chest, abdomen, and pelvis, likely related to the patient's underlying history of HIV. 6. Splenomegaly. 7. Hepatic steatosis. 8. Small volume free fluid in the pelvis, likely physiologic. Electronically Signed   By: Constance Holster M.D.   On: 08/02/2018 23:15    ASSESSMENT AND PLAN: 1. Anemia  2. Thrombocytopenia 3. PCP , COVid19 neg 4. Sepsis  5. AKI 6. HIV/AIDS  -Anemia thrombocytopenia are likely multifactorial. She has no evidence of TTP/MAHA based on current labs. Peripheral smear available for review.  -Bone marrow biopsy results showed no overt evidence of granulomas lymphoma AFB or other overt viral inclusions. No overt evidence of MDS-like changes related to HIV. No megaloblastoid changes. Some minor hemophagocytic forms noted in the bone marrow which is likely reactive given her recurrent infections. No acute interventions for the minor hemophagocytic changes at this time other than treating underlying infectious process or acute inflammatory process.   -It appears that she has remained off Bactrim but is currently taking dapsone 100 mg daily.  Dapsone has been stoppped.  -Overall picture is consistent with severe sepsis which appears to be driving the patient's worsened blood counts at this time.  Recurrent sepsis could potentially flare her secondary HLH/ hemophagocytosis which could also worsen cytopenias.  The significantly elevated ferritin to 5k would be concerning for early signs of this. Primary goal would to have the patient sepsis under control. Her antibiotics could potentially also add to thrombocytopenia and will need to be watched. -Continue folic acid and B  complex vitamin to support hematopoiesis. -Transfuse packed red blood cells for hemoglobin less than 7.0 or active bleeding.    -Transfuse platelets for platelet count less than 20,000  if active bleeding or in the setting of sepsis.   -Please do 1 hour post count after each use of platelets.    LOS: 3 days   Mikey Bussing, DNP, AGPCNP-BC, AOCNP 08/05/18

## 2018-08-06 DIAGNOSIS — J189 Pneumonia, unspecified organism: Secondary | ICD-10-CM

## 2018-08-06 DIAGNOSIS — M545 Low back pain: Secondary | ICD-10-CM

## 2018-08-06 DIAGNOSIS — Z21 Asymptomatic human immunodeficiency virus [HIV] infection status: Secondary | ICD-10-CM

## 2018-08-06 LAB — CBC WITH DIFFERENTIAL/PLATELET
Abs Immature Granulocytes: 0.4 10*3/uL — ABNORMAL HIGH (ref 0.00–0.07)
Band Neutrophils: 2 %
Band Neutrophils: 10 %
Basophils Absolute: 0 10*3/uL (ref 0.0–0.1)
Basophils Absolute: 0 10*3/uL (ref 0.0–0.1)
Basophils Relative: 0 %
Basophils Relative: 0 %
Blasts: 0 %
Eosinophils Absolute: 0 10*3/uL (ref 0.0–0.5)
Eosinophils Absolute: 0 10*3/uL (ref 0.0–0.5)
Eosinophils Relative: 0 %
Eosinophils Relative: 0 %
HCT: 24.2 % — ABNORMAL LOW (ref 36.0–46.0)
HCT: 29.6 % — ABNORMAL LOW (ref 36.0–46.0)
Hemoglobin: 7.5 g/dL — ABNORMAL LOW (ref 12.0–15.0)
Hemoglobin: 9.6 g/dL — ABNORMAL LOW (ref 12.0–15.0)
Lymphocytes Relative: 2 %
Lymphocytes Relative: 7 %
Lymphs Abs: 0.3 10*3/uL — ABNORMAL LOW (ref 0.7–4.0)
Lymphs Abs: 0.9 10*3/uL (ref 0.7–4.0)
MCH: 30 pg (ref 26.0–34.0)
MCH: 30.2 pg (ref 26.0–34.0)
MCHC: 31 g/dL (ref 30.0–36.0)
MCHC: 32.4 g/dL (ref 30.0–36.0)
MCV: 96.8 fL (ref 80.0–100.0)
MCV: 93.1 fL (ref 80.0–100.0)
Metamyelocytes Relative: 2 %
Metamyelocytes Relative: 2 %
Monocytes Absolute: 0.5 10*3/uL (ref 0.1–1.0)
Monocytes Absolute: 0.1 10*3/uL (ref 0.1–1.0)
Monocytes Relative: 4 %
Monocytes Relative: 1 %
Myelocytes: 1 %
Myelocytes: 2 %
Neutro Abs: 12.1 10*3/uL — ABNORMAL HIGH (ref 1.7–7.7)
Neutro Abs: 11.5 10*3/uL — ABNORMAL HIGH (ref 1.7–7.7)
Neutrophils Relative %: 89 %
Neutrophils Relative %: 78 %
Other: 0 %
Platelets: 12 10*3/uL — CL (ref 150–400)
Platelets: 20 10*3/uL — CL (ref 150–400)
Promyelocytes Relative: 0 %
RBC: 2.5 MIL/uL — ABNORMAL LOW (ref 3.87–5.11)
RBC: 3.18 MIL/uL — ABNORMAL LOW (ref 3.87–5.11)
RDW: 23.9 % — ABNORMAL HIGH (ref 11.5–15.5)
RDW: 21.3 % — ABNORMAL HIGH (ref 11.5–15.5)
WBC: 13.3 10*3/uL — ABNORMAL HIGH (ref 4.0–10.5)
WBC: 12.5 10*3/uL — ABNORMAL HIGH (ref 4.0–10.5)
nRBC: 2.5 % — ABNORMAL HIGH (ref 0.0–0.2)
nRBC: 0 /100 WBC
nRBC: 3 % — ABNORMAL HIGH (ref 0.0–0.2)

## 2018-08-06 LAB — FUNGUS CULTURE WITH STAIN

## 2018-08-06 LAB — PREPARE RBC (CROSSMATCH)

## 2018-08-06 LAB — BASIC METABOLIC PANEL
Anion gap: 11 (ref 5–15)
BUN: 30 mg/dL — ABNORMAL HIGH (ref 6–20)
CO2: 14 mmol/L — ABNORMAL LOW (ref 22–32)
Calcium: 7.7 mg/dL — ABNORMAL LOW (ref 8.9–10.3)
Chloride: 108 mmol/L (ref 98–111)
Creatinine, Ser: 0.94 mg/dL (ref 0.44–1.00)
GFR calc Af Amer: >60 mL/min (ref >60)
GFR calc non Af Amer: >60 mL/min (ref >60)
Glucose, Bld: 219 mg/dL — ABNORMAL HIGH (ref 70–99)
Potassium: 4.8 mmol/L (ref 3.5–5.1)
Sodium: 133 mmol/L — ABNORMAL LOW (ref 135–145)

## 2018-08-06 LAB — MPO/PR-3 (ANCA) ANTIBODIES
ANCA Proteinase 3: <3.5 U/mL (ref 0.0–3.5)
Myeloperoxidase Abs: <9 U/mL (ref 0.0–9.0)

## 2018-08-06 LAB — ANTI-DNA ANTIBODY, DOUBLE-STRANDED: ds DNA Ab: <1 IU/mL (ref 0–9)

## 2018-08-06 LAB — ANTINUCLEAR ANTIBODIES, IFA: ANA Ab, IFA: NEGATIVE

## 2018-08-06 LAB — RHEUMATOID FACTOR: Rheumatoid fact SerPl-aCnc: 13.8 IU/mL (ref 0.0–13.9)

## 2018-08-06 LAB — GLUCOSE, CAPILLARY
Glucose-Capillary: 191 mg/dL — ABNORMAL HIGH (ref 70–99)
Glucose-Capillary: 180 mg/dL — ABNORMAL HIGH (ref 70–99)
Glucose-Capillary: 200 mg/dL — ABNORMAL HIGH (ref 70–99)
Glucose-Capillary: 202 mg/dL — ABNORMAL HIGH (ref 70–99)

## 2018-08-06 LAB — LACTIC ACID, PLASMA
Lactic Acid, Venous: 3.5 mmol/L (ref 0.5–1.9)
Lactic Acid, Venous: 3.1 mmol/L (ref 0.5–1.9)

## 2018-08-06 LAB — FUNGAL ORGANISM REFLEX

## 2018-08-06 LAB — ANTI-SCLERODERMA ANTIBODY: Scleroderma (Scl-70) (ENA) Antibody, IgG: <0.2 AI (ref 0.0–0.9)

## 2018-08-06 LAB — SJOGRENS SYNDROME-B EXTRACTABLE NUCLEAR ANTIBODY: SSB (La) (ENA) Antibody, IgG: <0.2 AI (ref 0.0–0.9)

## 2018-08-06 LAB — PROCALCITONIN: Procalcitonin: 28.16 ng/mL

## 2018-08-06 LAB — FUNGUS CULTURE RESULT

## 2018-08-06 LAB — SJOGRENS SYNDROME-A EXTRACTABLE NUCLEAR ANTIBODY: SSA (Ro) (ENA) Antibody, IgG: <0.2 AI (ref 0.0–0.9)

## 2018-08-06 MED ORDER — SODIUM CHLORIDE 0.9 % IV BOLUS
500.0000 mL | Freq: Once | INTRAVENOUS | Status: AC
  Administered 2018-08-06: 250 mL via INTRAVENOUS

## 2018-08-06 MED ORDER — IBUPROFEN 200 MG PO TABS: 400.0000 mg | ORAL_TABLET | Freq: Once | ORAL | Status: DC

## 2018-08-06 MED ORDER — SODIUM CHLORIDE 0.9% IV SOLUTION
Freq: Once | INTRAVENOUS | Status: AC
  Administered 2018-08-06: 13:00:00 via INTRAVENOUS

## 2018-08-06 MED ORDER — SODIUM CHLORIDE 0.9 % IV SOLN
INTRAVENOUS | Status: DC
  Administered 2018-08-06 – 2018-08-07 (×3): via INTRAVENOUS

## 2018-08-06 MED ORDER — BISACODYL 5 MG PO TBEC
5.0000 mg | DELAYED_RELEASE_TABLET | Freq: Every day | ORAL | Status: DC | PRN
  Administered 2018-08-08: 08:00:00 5 mg via ORAL
  Filled 2018-08-06: qty 1

## 2018-08-06 MED ORDER — SODIUM CHLORIDE 0.9% IV SOLUTION
Freq: Once | INTRAVENOUS | Status: AC
  Administered 2018-08-06: 10:00:00 via INTRAVENOUS

## 2018-08-06 NOTE — Progress Notes (Signed)
Patient ID: Krystal Short, female   DOB: [birth year], 31 y.o.   MRN: [id number]         [facility]  Date of Admission:  08/01/2018           Day 5 clindamycin        Day 5 primaquine        Day 5 cefepime ASSESSMENT: Krystal Short has had 5 admissions with acute respiratory failure and pneumonitis since her diagnosis with HIV infection late last year.  She is also developed progressive anemia and thrombocytopenia.  On previous admission she has improved promptly on empiric antibiotics and steroids.  This admission occurred while still on prednisone 40 mg daily.  Unfortunately [facility] does not have any sputum specimen for pneumocystis that supposedly was submitted on 08/02/2018.  She has never had a confirmed diagnosis of pneumocystis and I do not think that pneumocystis explains her recurrent pneumonitis and certainly does not explain her cytopenias.  Unfortunately her recent bone marrow biopsy, bronchoscopy and lung biopsy were nondiagnostic. Blood work for possible autoimmune vasculitic causes are pending.  I doubt that she has bacterial pneumonia and stop cefepime now.  She has developed severe lower back pain over the past several days.  Suspect that may be from being in bed and in the chair for prolonged periods of time but I will consider imaging if it continues.  PLAN: 1. Continue clindamycin and primaquine 2. Continue Biktarvy 3. Discontinue cefepime  Active Problems:   Pulmonary infiltrates   Fever   Symptomatic anemia   Thrombocytopenia (HCC)   Hyponatremia   AIDS (acquired immune deficiency syndrome) (HCC)   Molluscum contagiosum   Sepsis (HCC)   Acute respiratory failure with hypoxemia (HCC)   Chest pain   AKI (acute kidney injury) ([place])   Scheduled Meds: . B-complex with vitamin C  1 tablet Oral Daily  . bictegravir-emtricitabine-tenofovir AF  1 tablet Oral Daily  . Chlorhexidine Gluconate Cloth  6 each Topical Daily  . dextromethorphan-guaiFENesin   2 tablet Oral BID  . folic acid  2 mg Oral Daily  . insulin aspart  0-9 Units Subcutaneous TID WC  . mouth rinse  15 mL Mouth Rinse BID  . methylPREDNISolone (SOLU-MEDROL) injection  40 mg Intravenous Q12H  .  morphine injection  1 mg Intravenous Once  . pantoprazole  40 mg Oral BID  . primaquine  30 mg Oral Daily  . sodium bicarbonate  650 mg Oral TID   Continuous Infusions: . sodium chloride 100 mL/hr at 08/06/18 0811  . ceFEPime (MAXIPIME) IV 2 g (08/06/18 0943)  . clindamycin (CLEOCIN) IV Stopped (08/06/18 0540)   PRN Meds:.benzonatate, bisacodyl, morphine injection, nitroGLYCERIN, ondansetron (ZOFRAN) IV, oxyCODONE-acetaminophen   SUBJECTIVE: Krystal Short has developed severe (7 out of 10) low back pain over the past few days.  She says that it is slightly worse just to the right of midline.  It gets worse with movement.  It has not improved with being out of bed more.  She continues to be bothered by dry cough, occasionally productive of yellow sputum and shortness of breath but thinks she is a little better in this regard since admission.  Review of Systems: Review of Systems  Constitutional: Positive for malaise/fatigue. Negative for chills, diaphoresis and fever.  Respiratory: Positive for cough, sputum production and shortness of breath.   Cardiovascular: Negative for chest pain.  Musculoskeletal: Positive for back pain.    Allergies  Allergen Reactions  . Heparin Other (See Comments)  Unknown reaction    OBJECTIVE: Vitals:   08/06/18 0613 08/06/18 0700 08/06/18 0800 08/06/18 1012  BP:  117/73 114/66 99/62  Pulse: (!) 132 (!) 138 (!) 130 (!) 126  Resp: (!) 24 (!) 35 (!) 23 (!) 33  Temp:   97.7 F (36.5 C) 97.7 F (36.5 C)  TempSrc:   Oral Oral  SpO2: 95% 93% 94% 95%  Weight:      Height:       Body mass index is 23.62 kg/m.  Physical Exam Constitutional:      Comments: She is resting quietly in her recliner.  Cardiovascular:     Rate and Rhythm: Regular  rhythm.     Heart sounds: No murmur.     Comments: She is tachycardic Pulmonary:     Effort: Pulmonary effort is normal.     Breath sounds: No wheezing or rales.  Psychiatric:        Mood and Affect: Mood normal.     Lab Results Lab Results  Component Value Date   WBC 13.3 (H) 08/06/2018   HGB 7.5 (L) 08/06/2018   HCT 24.2 (L) 08/06/2018   MCV 96.8 08/06/2018   PLT 12 (LL) 08/06/2018    Lab Results  Component Value Date   CREATININE 0.94 08/06/2018   BUN 30 (H) 08/06/2018   NA 133 (L) 08/06/2018   K 4.8 08/06/2018   CL 108 08/06/2018   CO2 14 (L) 08/06/2018    Lab Results  Component Value Date   ALT 18 08/02/2018   AST 34 08/02/2018   ALKPHOS 104 08/02/2018   BILITOT 1.1 08/02/2018     Microbiology: Recent Results (from the past 240 hour(s))  Novel Coronavirus, NAA ([facility])     Status: None   Collection Time: 07/30/18 10:46 AM  Result Value Ref Range Status   SARS-CoV-2, NAA Not Detected Not Detected Final    Comment: Testing was performed using the cobas(R) SARS-CoV-2 test. This test was developed and its performance characteristics determined by [employer]. This test has not been FDA cleared or approved. This test has been authorized by FDA under an Emergency Use Authorization (EUA). This test is only authorized for the duration of time the declaration that circumstances exist justifying the authorization of the emergency use of in vitro diagnostic tests for detection of SARS-CoV-2 virus and/or diagnosis of COVID-19 infection under section 564(b)(1) of the Act, 21 U.S.C. 768TLX-7(W)(6), unless the authorization is terminated or revoked sooner. When diagnostic testing is negative, the possibility of a false negative result should be considered in the context of a patient's recent exposures and the presence of clinical signs and symptoms consistent with COVID-19. An individual without symptoms of COVID-19 and who is not shedding SARS-CoV-2 virus  would expect to have a negati ve (not detected) result in this assay.   Blood Culture (routine x 2)     Status: None (Preliminary result)   Collection Time: 08/01/18 10:27 PM   Specimen: BLOOD  Result Value Ref Range Status   Specimen Description   Final    BLOOD SITE NOT SPECIFIED Performed at [facility], [address], [place], [place] [address]    Special Requests   Final    BOTTLES DRAWN AEROBIC AND ANAEROBIC Blood Culture adequate volume Performed at [facility] [address] [place], [place] [address]    Culture   Final    NO GROWTH 4 DAYS Performed at [facility], [place] [address]  [place], [place] [address]    Report Status PENDING  Incomplete  Fungus culture, blood     Status: None (Preliminary result)   Collection Time: 08/01/18 10:27 PM   Specimen: BLOOD  Result Value Ref Range Status   Specimen Description   Final    BLOOD SITE NOT SPECIFIED Performed at [facility] [address], [place], [place] [address]    Special Requests   Final    BOTTLES DRAWN AEROBIC AND ANAEROBIC Blood Culture adequate volume Performed at [facility] [address], [place], [place] [address]    Culture   Final    NO GROWTH 4 DAYS Performed at [facility], [place] [address] [place], [place] [address]    Report Status PENDING  Incomplete  SARS Coronavirus 2 (CEPHEID- Performed in [place] hospital lab), Hosp Order     Status: None   Collection Time: 08/01/18 10:28 PM   Specimen: Nasopharyngeal Swab  Result Value Ref Range Status   SARS Coronavirus 2 NEGATIVE NEGATIVE Final    Comment: (NOTE) If result is NEGATIVE SARS-CoV-2 target nucleic acids are NOT DETECTED. The SARS-CoV-2 RNA is generally detectable in upper and lower  respiratory specimens during the acute phase of infection. The lowest  concentration of SARS-CoV-2 viral copies this assay can detect is 250  copies / mL. A negative result  does not preclude SARS-CoV-2 infection  and should not be used as the sole basis for treatment or other  patient management decisions.  A negative result may occur with  improper specimen collection / handling, submission of specimen other  than nasopharyngeal swab, presence of viral mutation(s) within the  areas targeted by this assay, and inadequate number of viral copies  (<250 copies / mL). A negative result must be combined with clinical  observations, patient history, and epidemiological information. If result is POSITIVE SARS-CoV-2 target nucleic acids are DETECTED. The SARS-CoV-2 RNA is generally detectable in upper and lower  respiratory specimens dur ing the acute phase of infection.  Positive  results are indicative of active infection with SARS-CoV-2.  Clinical  correlation with patient history and other diagnostic information is  necessary to determine patient infection status.  Positive results do  not rule out bacterial infection or co-infection with other viruses. If result is PRESUMPTIVE POSTIVE SARS-CoV-2 nucleic acids MAY BE PRESENT.   A presumptive positive result was obtained on the submitted specimen  and confirmed on repeat testing.  While 2019 novel coronavirus  (SARS-CoV-2) nucleic acids may be present in the submitted sample  additional confirmatory testing may be necessary for epidemiological  and / or clinical management purposes  to differentiate between  SARS-CoV-2 and other Sarbecovirus currently known to infect humans.  If clinically indicated additional testing with an alternate test  methodology [phone]) is advised. The SARS-CoV-2 RNA is generally  detectable in upper and lower respiratory sp ecimens during the acute  phase of infection. The expected result is Negative. Fact Sheet for Patients:  StrictlyIdeas.no Fact Sheet for Healthcare Providers: BankingDealers.co.za This test is not yet approved or  cleared by the [place] FDA and has been authorized for detection and/or diagnosis of SARS-CoV-2 by FDA under an Emergency Use Authorization (EUA).  This EUA will remain in effect (meaning this test can be used) for the duration of the COVID-19 declaration under Section 564(b)(1) of the Act, 21 U.S.C. section 360bbb-3(b)(1), unless the authorization is terminated or revoked sooner. Performed at [facility], [place]  [address], [place], [place] [address]   MRSA PCR Screening     Status: None   Collection Time: 08/02/18  3:11 AM   Specimen: Nasal Mucosa; Nasopharyngeal  Result Value Ref Range Status   MRSA by PCR NEGATIVE NEGATIVE Final    Comment:        The GeneXpert MRSA Assay (FDA approved for NASAL specimens only), is one component of a comprehensive MRSA colonization surveillance program. It is not intended to diagnose MRSA infection nor to guide or monitor treatment for MRSA infections. Performed at [facility], [place] [address] [place], [place] [address]   Blood Culture (routine x 2)     Status: None (Preliminary result)   Collection Time: 08/02/18 11:24 AM   Specimen: BLOOD LEFT ARM  Result Value Ref Range Status   Specimen Description   Final    BLOOD LEFT ARM Performed at [facility], [place] [address], [place], [place] [address]    Special Requests   Final    BOTTLES DRAWN AEROBIC AND ANAEROBIC Blood Culture adequate volume Performed at [place] [address], [place], [place] [address]    Culture   Final    NO GROWTH 4 DAYS Performed at [facility], [place] [address], [place], [place] [address]    Report Status PENDING  Incomplete  Respiratory Panel by PCR     Status: Abnormal   Collection Time: 08/02/18 12:19 PM   Specimen: Nasopharyngeal Swab; Respiratory  Result Value Ref Range Status   Adenovirus NOT DETECTED NOT DETECTED Final   Coronavirus 229E DETECTED (A) NOT DETECTED Final     Comment: (NOTE) The Coronavirus on the Respiratory Panel, DOES NOT test for the novel  Coronavirus (2019 nCoV)    Coronavirus HKU1 NOT DETECTED NOT DETECTED Final   Coronavirus NL63 NOT DETECTED NOT DETECTED Final   Coronavirus OC43 NOT DETECTED NOT DETECTED Final   Metapneumovirus NOT DETECTED NOT DETECTED Final   Rhinovirus / Enterovirus NOT DETECTED NOT DETECTED Final   Influenza A NOT DETECTED NOT DETECTED Final   Influenza B NOT DETECTED NOT DETECTED Final   Parainfluenza Virus 1 NOT DETECTED NOT DETECTED Final   Parainfluenza Virus 2 NOT DETECTED NOT DETECTED Final   Parainfluenza Virus 3 NOT DETECTED NOT DETECTED Final   Parainfluenza Virus 4 NOT DETECTED NOT DETECTED Final   Respiratory Syncytial Virus NOT DETECTED NOT DETECTED Final   Bordetella pertussis NOT DETECTED NOT DETECTED Final   Chlamydophila pneumoniae NOT DETECTED NOT DETECTED Final   Mycoplasma pneumoniae NOT DETECTED NOT DETECTED Final    Comment: Performed at [facility], [place], [place], [place] [address]    Michel Bickers, [facility] [phone] pager   [phone] cell 08/06/2018, 10:20 AM

## 2018-08-06 NOTE — Progress Notes (Signed)
HEMATOLOGY-ONCOLOGY PROGRESS NOTE  SUBJECTIVE: Having more back pain and abdominal discomfort today. Scheduled to receive 1 unit PRBC today due to anemia and persistent tachycardia. Breathing and cough better overall.  She has remained afebrile for the past 24 hours.  Denies bleeding.  REVIEW OF SYSTEMS:   Constitutional: Denies fevers, chills or abnormal weight loss. Still having fatigue.  Eyes: Denies blurriness of vision Ears, nose, mouth, throat, and face: Denies mucositis or sore throat Respiratory: Cough and shortness of breath are improving.  Cardiovascular: Denies palpitation, chest discomfort Gastrointestinal:  Reports abdominal discomfort. Denies nausea, heartburn or change in bowel habits Skin: Denies abnormal skin rashes Lymphatics: Denies new lymphadenopathy or easy bruising Neurological:Denies numbness, tingling or new weaknesses Behavioral/Psych: Mood is stable, no new changes  Extremities: No lower extremity edema All other systems were reviewed with the patient and are negative.  I have reviewed the past medical history, past surgical history, social history and family history with the patient and they are unchanged from previous note.   PHYSICAL EXAMINATION:  Vitals:   08/06/18 1027 08/06/18 1100  BP: 107/67 106/74  Pulse:  (!) 119  Resp:  (!) 29  Temp: 97.9 F (36.6 C)   SpO2:  91%   Filed Weights   08/01/18 2226 08/02/18 0103  Weight: 125 lb (56.7 kg) 125 lb (56.7 kg)    Intake/Output from previous day: 06/29 0701 - 06/30 0700 In: 1142.1 [P.O.:120; IV Piggyback:1022.1] Out: 2600 [Urine:2600]  GENERAL:alert, no distress. Appears uncomfortable. SKIN: skin color, texture, turgor are normal, no rashes or significant lesions EYES: normal, Conjunctiva are pink and non-injected, sclera clear OROPHARYNX:no exudate, no erythema and lips, buccal mucosa, and tongue normal  NECK: supple, thyroid normal size, non-tender, without nodularity LYMPH:  no palpable  lymphadenopathy in the cervical, axillary or inguinal LUNGS: clear to auscultation and percussion with normal breathing effort HEART: regular rate & rhythm and no murmurs and no lower extremity edema ABDOMEN:abdomen soft, non-tender and normal bowel sounds Musculoskeletal:no cyanosis of digits and no clubbing  NEURO: alert & oriented x 3 with fluent speech, no focal motor/sensory deficits  LABORATORY DATA:  I have reviewed the data as listed CMP Latest Ref Rng & Units 08/06/2018 08/05/2018 08/04/2018  Glucose 70 - 99 mg/dL 219(H) 163(H) 115(H)  BUN 6 - 20 mg/dL 30(H) 31(H) 25(H)  Creatinine 0.44 - 1.00 mg/dL 0.94 0.94 0.93  Sodium 135 - 145 mmol/L 133(L) 132(L) 133(L)  Potassium 3.5 - 5.1 mmol/L 4.8 4.4 4.3  Chloride 98 - 111 mmol/L 108 109 109  CO2 22 - 32 mmol/L 14(L) 12(L) 14(L)  Calcium 8.9 - 10.3 mg/dL 7.7(L) 7.3(L) 7.3(L)  Total Protein 6.5 - 8.1 g/dL - - -  Total Bilirubin 0.3 - 1.2 mg/dL - - -  Alkaline Phos 38 - 126 U/L - - -  AST 15 - 41 U/L - - -  ALT 0 - 44 U/L - - -    Lab Results  Component Value Date   WBC 13.3 (H) 08/06/2018   HGB 7.5 (L) 08/06/2018   HCT 24.2 (L) 08/06/2018   MCV 96.8 08/06/2018   PLT 12 (LL) 08/06/2018   NEUTROABS 12.1 (H) 08/06/2018    Ct Abdomen Pelvis W Contrast  Result Date: 07/11/2018 CLINICAL DATA:  Hepatomegaly.  Patient has no current complaints. EXAM: CT ABDOMEN AND PELVIS WITH CONTRAST TECHNIQUE: Multidetector CT imaging of the abdomen and pelvis was performed using the standard protocol following bolus administration of intravenous contrast. CONTRAST:  157m OMNIPAQUE IOHEXOL 300  MG/ML SOLN, 31m OMNIPAQUE IOHEXOL 300 MG/ML SOLN COMPARISON:  CT chest dated Jul 04, 2018. FINDINGS: Lower chest: Improving septal thickening and ground-glass density at the lung bases. Resolved pleural effusions. Hepatobiliary: Mild hepatomegaly. No focal liver abnormality. The gallbladder is unremarkable. No biliary dilatation. Pancreas: Unremarkable. No  pancreatic ductal dilatation or surrounding inflammatory changes. Spleen: Mild splenomegaly.  No focal abnormality. Adrenals/Urinary Tract: Adrenal glands are unremarkable. Kidneys are normal, without renal calculi, focal lesion, or hydronephrosis. Bladder is unremarkable. Stomach/Bowel: Small hiatal hernia. The stomach is otherwise within normal limits. No bowel wall thickening, distention, or surrounding inflammatory changes. Normal appendix. Vascular/Lymphatic: No significant vascular findings are present. No enlarged abdominal or pelvic lymph nodes. Reproductive: Uterus and bilateral adnexa are unremarkable. Tampon in the vagina. Other: Trace free fluid in the pelvis is likely physiologic. No pneumoperitoneum. Musculoskeletal: No acute or significant osseous findings. Degenerative disc disease at L5-S1. IMPRESSION: 1. Mild hepatosplenomegaly.  No focal abnormality. 2. Improving septal thickening and ground-glass density at the lung bases, favor resolving atypical infection given history of HIV. Electronically Signed   By: WTitus DubinM.D.   On: 07/11/2018 18:16   Dg Chest Port 1 View  Result Date: 08/02/2018 CLINICAL DATA:  Pt reported center chest pain and SOB that started a few minutes ago. EXAM: PORTABLE CHEST - 1 VIEW COMPARISON:  none FINDINGS: Worsening bilateral reticulonodular ground-glass opacities. Heart size and mediastinal contours are within normal limits. No effusion. No pneumothorax. Visualized bones unremarkable. IMPRESSION: Continued worsening of bilateral reticulonodular ground-glass opacities. Electronically Signed   By: DLucrezia EuropeM.D.   On: 08/02/2018 15:51   Dg Chest Port 1 View  Result Date: 08/02/2018 CLINICAL DATA:  Fever and hypoxia EXAM: PORTABLE CHEST 1 VIEW COMPARISON:  07/12/2018, 06/18/2018, CT chest 07/05/2018 FINDINGS: Bilateral reticular and ground-glass opacity, increased compared to prior. Normal heart size. No pneumothorax. IMPRESSION: Interval worsening of  bilateral reticular and ground-glass infiltrates since radiograph 07/12/2018. Electronically Signed   By: KDonavan FoilM.D.   On: 08/02/2018 00:03   Dg Chest Port 1 View  Result Date: 07/12/2018 CLINICAL DATA:  Status post endobronchial ultrasound in aspiration. EXAM: PORTABLE CHEST 1 VIEW COMPARISON:  Radiograph of Jul 04, 2018. FINDINGS: The heart size and mediastinal contours are within normal limits. Both lungs are clear. No pneumothorax or pleural effusion is noted. The visualized skeletal structures are unremarkable. IMPRESSION: No active disease. Electronically Signed   By: JMarijo ConceptionM.D.   On: 07/12/2018 15:22   Ct Angio Chest/abd/pel For Dissection W And/or W/wo  Result Date: 08/02/2018 CLINICAL DATA:  Back pain EXAM: CT ANGIOGRAPHY CHEST, ABDOMEN AND PELVIS TECHNIQUE: Multidetector CT imaging through the chest, abdomen and pelvis was performed using the standard protocol during bolus administration of intravenous contrast. Multiplanar reconstructed images and MIPs were obtained and reviewed to evaluate the vascular anatomy. CONTRAST:  1045mOMNIPAQUE IOHEXOL 350 MG/ML SOLN COMPARISON:  CT chest dated Jul 04, 2018 FINDINGS: CTA CHEST FINDINGS Cardiovascular: Evaluation for pulmonary emboli is limited by respiratory motion artifact. Given this limitation, there is no large centrally located pulmonary embolus. There is no definite dissection. The heart size is normal. There is no large pericardial effusion. The intracardiac blood pool is hypodense relative to the adjacent myocardium consistent with anemia. Mediastinum/Nodes: There are enlarged mediastinal and hilar lymph nodes. For example there is a right paratracheal lymph node measuring approximately 1.4 cm. The thyroid gland is unremarkable. There is mild supraclavicular adenopathy. Lungs/Pleura: There are diffuse ground-glass airspace opacities bilaterally with  prominent interlobular septal thickening. There are trace bilateral pleural  effusions. There is no pneumothorax. The trachea is unremarkable. Musculoskeletal: No chest wall abnormality. No acute or significant osseous findings. Review of the MIP images confirms the above findings. CTA ABDOMEN AND PELVIS FINDINGS VASCULAR Aorta: Normal caliber aorta without aneurysm, dissection, vasculitis or significant stenosis. Celiac: Patent without evidence of aneurysm, dissection, vasculitis or significant stenosis. SMA: Patent without evidence of aneurysm, dissection, vasculitis or significant stenosis. Renals: Both renal arteries are patent without evidence of aneurysm, dissection, vasculitis, fibromuscular dysplasia or significant stenosis. IMA: Patent without evidence of aneurysm, dissection, vasculitis or significant stenosis. Inflow: Patent without evidence of aneurysm, dissection, vasculitis or significant stenosis. Veins: No obvious venous abnormality within the limitations of this arterial phase study. Review of the MIP images confirms the above findings. NON-VASCULAR Hepatobiliary: There is hepatic steatosis. The gallbladder is unremarkable. Pancreas: Unremarkable. No pancreatic ductal dilatation or surrounding inflammatory changes. Spleen: The spleen is significantly enlarged. Adrenals/Urinary Tract: Adrenal glands are unremarkable. Kidneys are normal, without renal calculi, focal lesion, or hydronephrosis. Bladder is unremarkable. Stomach/Bowel: Stomach is within normal limits. Appendix appears normal. No evidence of bowel wall thickening, distention, or inflammatory changes. Lymphatic: There is scattered prominent retroperitoneal and mesenteric lymph nodes. There are prominent inguinal lymph nodes. Reproductive: Uterus and bilateral adnexa are unremarkable. Other: There is mild body wall edema. There is a small volume of free fluid in the pelvis. Musculoskeletal: No acute or significant osseous findings. Review of the MIP images confirms the above findings. IMPRESSION: 1. No evidence of a  dissection. 2. Evaluation for pulmonary emboli is limited by motion artifact. Given this limitation there is no large centrally located pulmonary embolus. 3. Again noted are diffuse bilateral ground-glass airspace opacities most likely representing an atypical infectious process. Sarcoidosis can have a similar appearance in the appropriate clinical setting. 4. Trace bilateral pleural effusions. 5. Scattered adenopathy throughout the chest, abdomen, and pelvis, likely related to the patient's underlying history of HIV. 6. Splenomegaly. 7. Hepatic steatosis. 8. Small volume free fluid in the pelvis, likely physiologic. Electronically Signed   By: Constance Holster M.D.   On: 08/02/2018 23:15    ASSESSMENT AND PLAN: 1. Anemia  2. Thrombocytopenia 3. PCP , COVid19 neg 4. Sepsis  5. AKI 6. HIV/AIDS  -Anemia thrombocytopenia are likely multifactorial. She has no evidence of TTP/MAHA based on current labs.   -Bone marrow biopsy results showed no overt evidence of granulomas lymphoma AFB or other overt viral inclusions. No overt evidence of MDS-like changes related to HIV. No megaloblastoid changes. Some minor hemophagocytic forms noted in the bone marrow which is likely reactive given her recurrent infections. No acute interventions for the minor hemophagocytic changes at this time other than treating underlying infectious process or acute inflammatory process.   -It appears that she has remained off Bactrim but was taking dapsone 100 mg daily prior to admission.  Dapsone has been stoppped.  -Overall picture is consistent with severe sepsis which appears to be driving the patient's worsened blood counts at this time.  Recurrent sepsis could potentially flare her secondary HLH/ hemophagocytosis which could also worsen cytopenias.  The significantly elevated ferritin to 5k would be concerning for early signs of this. Primary goal would to have the patient sepsis under control. Her antibiotics could  potentially also add to thrombocytopenia and will need to be watched. -Continue folic acid and B complex vitamin to support hematopoiesis. -Transfuse packed red blood cells for hemoglobin less than 7.0 or active bleeding.  She is receiving 1 unit PRBC today.   -Transfuse platelets for platelet count less than 20,000 if active bleeding or in the setting of sepsis.  I have ordered 1 unit of platelets today -Please do 1 hour post count after each use of platelets. I have ordered this.    LOS: 4 days   Mikey Bussing, DNP, AGPCNP-BC, AOCNP 08/06/18

## 2018-08-06 NOTE — Progress Notes (Addendum)
Progress Note    Krystal Short  YHO:[id number] DOB: 11-13-1985  DOA: 08/01/2018 PCP: Lajean Manes, MD    Brief Narrative:     Medical records reviewed and are as summarized below:  Cortnee Short is an 33 y.o. female  with a past medical history significant for AIDS (recent CD4 count was <35 on 06/20/2018),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,questionablesarcoidosis(currently undergoing pulmonary work-up), ASCUS, and anemia.   Assessment/Plan:   Active Problems:   Hyponatremia   AIDS (acquired immune deficiency syndrome) (HCC)   Molluscum contagiosum   Symptomatic anemia   Thrombocytopenia (HCC)   Pulmonary infiltrates   Fever   Sepsis (HCC)   Acute respiratory failure with hypoxemia (HCC)   Chest pain   AKI (acute kidney injury) ([facility])    Acute hypoxic respiratory failure with fever/Bilateral lung infiltrate and sepsis -COVID 19 negative -NP swab: coronavirus (229E) -BC NGTD -Parvo IgG + but IgM negative -Pneumocystis smear pending -bronchoscopy on 6/5, biopsy" SCANT LUNG PARENCHYMA WITH FIBROSIS AND REACTIVE CHANGES. - NO GRANULOMATA IDENTIFIED." -concern is for PCP- AS:UORVIFBPPH and clindamycin in addition to cefepime, zithromax, steroids -ID/pulm/critical care consulted  Intermittent back pain High sensitivity troponin unremarkable, CTA chest/ab negative for  dissection , no large PE  Hyponatremia -stable  AKI -resolved  Anemia plus thrombocytopenia -S/p prbc transfusionx3 S/p plt x1 on 6/26  Hematology consulted: Bone marrow biopsy results showed no overt evidence of granulomas lymphoma AFB or other overt viral inclusions. No overt evidence of MDS-like changes related to HIV. No megaloblastoid changes. Some minor hemophagocytic forms noted in the bone marrow which is likely reactive given her recurrent infections. No acute interventions for the minor hemophagocytic changes at this time other than treating underlying infectious  process or acute inflammatory process.   -It appears that she has remained off Bactrim but is currently taking dapsone 100 mg daily. Dapsone has been stoppped.  -Overall picture is consistent with severe sepsis which appears to be driving the patient's worsened blood counts at this time.Recurrent sepsis could potentially flare her secondary HLH/hemophagocytosiswhich could also worsen cytopenias.The significantly elevated ferritin to 5kwould be concerning for early signs of this. Primary goal would to have the patient sepsis under control. Her antibiotics could potentially also add to thrombocytopenia and will need to be watched. -Continue folic acid and B complex vitamin to support hematopoiesis. -Transfuse packed red blood cells for hemoglobin less than 7.0 or active bleeding. -Transfuse platelets for platelet count less than 20,000 if active bleeding or in the setting of sepsis.  Metabolic acidosis: Non-anion gap acidosis No available ABG for complete analysis Currently on bicarb. Continue to monitor closely. Possibly related to HIV and associated illnesses  AIDS CD4=10 06/05/2017 On biktarvy ID following   Hyperglycemia -on steroids -SSI  Family Communication/Anticipated D/C date and plan/Code Status   DVT prophylaxis: scd Code Status: Full Code.  Family Communication:  Disposition Plan: needs continued hospitalization and medical work up   Medical Consultants:    PCCM  ID  HEME ONC     Subjective:   C/o back pain Overnight: continued to be tachycardic  Objective:    Vitals:   08/06/18 0400 08/06/18 0500 08/06/18 0600 08/06/18 0613  BP: 121/80 118/80 110/78   Pulse: (!) 130 (!) 133 (!) 132 (!) 132  Resp: (!) 33 (!) 29 (!) 27 (!) 24  Temp:      TempSrc:      SpO2: 92% 96% 92% 95%  Weight:      Height:  Intake/Output Summary (Last 24 hours) at 08/06/2018 0808 Last data filed at 08/06/2018 0700 Gross per 24 hour  Intake 1142.13 ml   Output 2600 ml  Net -1457.87 ml   Filed Weights   08/01/18 2226 08/02/18 0103  Weight: 56.7 kg 56.7 kg    Exam: In bed, chronically ill appearing Sinus tachy Diminished breath sounds, on  O2 A+Ox3 +BS, distended  Data Reviewed:   I have personally reviewed following labs and imaging studies:  Labs: Labs show the following:   Basic Metabolic Panel: Recent Labs  Lab 08/03/18 0819 08/04/18 0230 08/04/18 0237 08/04/18 1154 08/05/18 0710 08/06/18 0246  NA 131*  --  129* 133* 132* 133*  K 4.6  --  4.7 4.3 4.4 4.8  CL 107  --  105 109 109 108  CO2 14*  --  14* 14* 12* 14*  GLUCOSE 214*  --  124* 115* 163* 219*  BUN 17  --  22* 25* 31* 30*  CREATININE 1.03*  --  0.87 0.93 0.94 0.94  CALCIUM 6.9*  --  7.2* 7.3* 7.3* 7.7*  MG 1.9 1.9  --   --   --   --   PHOS  --  2.7  --   --   --   --    GFR Estimated Creatinine Clearance: 65.4 mL/min (by C-G formula based on SCr of 0.94 mg/dL). Liver Function Tests: Recent Labs  Lab 08/01/18 2227 08/02/18 1530  AST 44* 34  ALT 21 18  ALKPHOS 122 104  BILITOT 1.9* 1.1  PROT 6.9 6.3*  ALBUMIN 2.0* 1.9*   No results for input(s): LIPASE, AMYLASE in the last 168 hours. No results for input(s): AMMONIA in the last 168 hours. Coagulation profile No results for input(s): INR, PROTIME in the last 168 hours.  CBC: Recent Labs  Lab 08/01/18 2227 08/02/18 1000 08/02/18 1124 08/02/18 2124 08/03/18 0819 08/04/18 0237 08/05/18 0710 08/06/18 0246  WBC 16.0* 18.0*  --   --  15.3* 13.7* 12.0* 13.3*  NEUTROABS 11.8*  --   --   --  13.3* 10.7* 8.8* 12.1*  HGB 6.2* 9.3*  --   --  8.4* 8.6* 7.6* 7.5*  HCT 19.5* 29.0* 26.8*  --  27.6* 27.8* 24.9* 24.2*  MCV 104.3* 95.7  --   --  97.2 96.9 98.0 96.8  PLT 22* 17*  --  28* 16* 17* 13* 12*   Cardiac Enzymes: No results for input(s): CKTOTAL, CKMB, CKMBINDEX, TROPONINI in the last 168 hours. BNP (last 3 results) No results for input(s): PROBNP in the last 8760 hours. CBG: Recent  Labs  Lab 08/04/18 2138 08/05/18 0750 08/05/18 1130 08/05/18 1619 08/05/18 2150  GLUCAP 128* 174* 217* 214* 235*   D-Dimer: No results for input(s): DDIMER in the last 72 hours. Hgb A1c: No results for input(s): HGBA1C in the last 72 hours. Lipid Profile: No results for input(s): CHOL, HDL, LDLCALC, TRIG, CHOLHDL, LDLDIRECT in the last 72 hours. Thyroid function studies: No results for input(s): TSH, T4TOTAL, T3FREE, THYROIDAB in the last 72 hours.  Invalid input(s): FREET3 Anemia work up: No results for input(s): VITAMINB12, FOLATE, FERRITIN, TIBC, IRON, RETICCTPCT in the last 72 hours. Sepsis Labs: Recent Labs  Lab 08/03/18 0612 08/03/18 0819 08/04/18 0237 08/04/18 1301 08/05/18 0710 08/05/18 1050 08/06/18 0246  PROCALCITON  --   --   --   --   --  28.65 28.16  WBC  --  15.3* 13.7*  --  12.0*  --  13.3*  LATICACIDVEN 1.7 4.4*  --  2.0*  --   --  3.5*    Microbiology Recent Results (from the past 240 hour(s))  Novel Coronavirus, NAA ([facility])     Status: None   Collection Time: 07/30/18 10:46 AM  Result Value Ref Range Status   SARS-CoV-2, NAA Not Detected Not Detected Final    Comment: Testing was performed using the cobas(R) SARS-CoV-2 test. This test was developed and its performance characteristics determined by [employer]. This test has not been FDA cleared or approved. This test has been authorized by FDA under an Emergency Use Authorization (EUA). This test is only authorized for the duration of time the declaration that circumstances exist justifying the authorization of the emergency use of in vitro diagnostic tests for detection of SARS-CoV-2 virus and/or diagnosis of COVID-19 infection under section 564(b)(1) of the Act, 21 U.S.C. 384YKZ-9(D)(3), unless the authorization is terminated or revoked sooner. When diagnostic testing is negative, the possibility of a false negative result should be considered in the context of a patient's recent  exposures and the presence of clinical signs and symptoms consistent with COVID-19. An individual without symptoms of COVID-19 and who is not shedding SARS-CoV-2 virus would expect to have a negati ve (not detected) result in this assay.   Blood Culture (routine x 2)     Status: None (Preliminary result)   Collection Time: 08/01/18 10:27 PM   Specimen: BLOOD  Result Value Ref Range Status   Specimen Description   Final    BLOOD SITE NOT SPECIFIED Performed at [facility], [address] [place], [place] [address]    Special Requests   Final    BOTTLES DRAWN AEROBIC AND ANAEROBIC Blood Culture adequate volume Performed at [facility] [address] [place], [place] [address]    Culture   Final    NO GROWTH 4 DAYS Performed at [facility], [place] [address], [place], [place] [address]    Report Status PENDING  Incomplete  Fungus culture, blood     Status: None (Preliminary result)   Collection Time: 08/01/18 10:27 PM   Specimen: BLOOD  Result Value Ref Range Status   Specimen Description   Final    BLOOD SITE NOT SPECIFIED Performed at [facility] [address] [place], [place] [address]    Special Requests   Final    BOTTLES DRAWN AEROBIC AND ANAEROBIC Blood Culture adequate volume Performed at [facility] [address] [place], [place] [address]    Culture   Final    NO GROWTH 4 DAYS Performed at [facility], [place] [address], [place], [place] [address]    Report Status PENDING  Incomplete  SARS Coronavirus 2 (CEPHEID- Performed in [facility] lab), Hosp Order     Status: None   Collection Time: 08/01/18 10:28 PM   Specimen: Nasopharyngeal Swab  Result Value Ref Range Status   SARS Coronavirus 2 NEGATIVE NEGATIVE Final    Comment: (NOTE) If result is NEGATIVE SARS-CoV-2 target nucleic acids are NOT DETECTED. The SARS-CoV-2 RNA is generally detectable in upper and lower   respiratory specimens during the acute phase of infection. The lowest  concentration of SARS-CoV-2 viral copies this assay can detect is 250  copies / mL. A negative result does not preclude SARS-CoV-2 infection  and should not be used as the sole basis for treatment or other  patient management decisions.  A negative  result may occur with  improper specimen collection / handling, submission of specimen other  than nasopharyngeal swab, presence of viral mutation(s) within the  areas targeted by this assay, and inadequate number of viral copies  (<250 copies / mL). A negative result must be combined with clinical  observations, patient history, and epidemiological information. If result is POSITIVE SARS-CoV-2 target nucleic acids are DETECTED. The SARS-CoV-2 RNA is generally detectable in upper and lower  respiratory specimens dur ing the acute phase of infection.  Positive  results are indicative of active infection with SARS-CoV-2.  Clinical  correlation with patient history and other diagnostic information is  necessary to determine patient infection status.  Positive results do  not rule out bacterial infection or co-infection with other viruses. If result is PRESUMPTIVE POSTIVE SARS-CoV-2 nucleic acids MAY BE PRESENT.   A presumptive positive result was obtained on the submitted specimen  and confirmed on repeat testing.  While 2019 novel coronavirus  (SARS-CoV-2) nucleic acids may be present in the submitted sample  additional confirmatory testing may be necessary for epidemiological  and / or clinical management purposes  to differentiate between  SARS-CoV-2 and other Sarbecovirus currently known to infect humans.  If clinically indicated additional testing with an alternate test  methodology [phone]) is advised. The SARS-CoV-2 RNA is generally  detectable in upper and lower respiratory sp ecimens during the acute  phase of infection. The expected result is Negative. Fact  Sheet for Patients:  StrictlyIdeas.no Fact Sheet for Healthcare Providers: BankingDealers.co.za This test is not yet approved or cleared by the [place] FDA and has been authorized for detection and/or diagnosis of SARS-CoV-2 by FDA under an Emergency Use Authorization (EUA).  This EUA will remain in effect (meaning this test can be used) for the duration of the COVID-19 declaration under Section 564(b)(1) of the Act, 21 U.S.C. section 360bbb-3(b)(1), unless the authorization is terminated or revoked sooner. Performed at [facility], [place] [address] [place], [place] [address]   MRSA PCR Screening     Status: None   Collection Time: 08/02/18  3:11 AM   Specimen: Nasal Mucosa; Nasopharyngeal  Result Value Ref Range Status   MRSA by PCR NEGATIVE NEGATIVE Final    Comment:        The GeneXpert MRSA Assay (FDA approved for NASAL specimens only), is one component of a comprehensive MRSA colonization surveillance program. It is not intended to diagnose MRSA infection nor to guide or monitor treatment for MRSA infections. Performed at [facility], [place] [address] [place], [place] [address]   Blood Culture (routine x 2)     Status: None (Preliminary result)   Collection Time: 08/02/18 11:24 AM   Specimen: BLOOD LEFT ARM  Result Value Ref Range Status   Specimen Description   Final    BLOOD LEFT ARM Performed at [facility], [place] [address] [place], [place] [address]    Special Requests   Final    BOTTLES DRAWN AEROBIC AND ANAEROBIC Blood Culture adequate volume Performed at [facility] [address], [place], [place] [address]    Culture   Final    NO GROWTH 4 DAYS Performed at [facility], [place] [address], [place], [place] [address]    Report Status PENDING  Incomplete  Respiratory Panel by PCR     Status: Abnormal   Collection Time: 08/02/18 12:19 PM    Specimen: Nasopharyngeal Swab; Respiratory  Result Value Ref Range  Status   Adenovirus NOT DETECTED NOT DETECTED Final   Coronavirus 229E DETECTED (A) NOT DETECTED Final    Comment: (NOTE) The Coronavirus on the Respiratory Panel, DOES NOT test for the novel  Coronavirus (2019 nCoV)    Coronavirus HKU1 NOT DETECTED NOT DETECTED Final   Coronavirus NL63 NOT DETECTED NOT DETECTED Final   Coronavirus OC43 NOT DETECTED NOT DETECTED Final   Metapneumovirus NOT DETECTED NOT DETECTED Final   Rhinovirus / Enterovirus NOT DETECTED NOT DETECTED Final   Influenza A NOT DETECTED NOT DETECTED Final   Influenza B NOT DETECTED NOT DETECTED Final   Parainfluenza Virus 1 NOT DETECTED NOT DETECTED Final   Parainfluenza Virus 2 NOT DETECTED NOT DETECTED Final   Parainfluenza Virus 3 NOT DETECTED NOT DETECTED Final   Parainfluenza Virus 4 NOT DETECTED NOT DETECTED Final   Respiratory Syncytial Virus NOT DETECTED NOT DETECTED Final   Bordetella pertussis NOT DETECTED NOT DETECTED Final   Chlamydophila pneumoniae NOT DETECTED NOT DETECTED Final   Mycoplasma pneumoniae NOT DETECTED NOT DETECTED Final    Comment: Performed at [facility], [place] [address] [place], [place] [address]    Procedures and diagnostic studies:  No results found.  Medications:   . B-complex with vitamin C  1 tablet Oral Daily  . bictegravir-emtricitabine-tenofovir AF  1 tablet Oral Daily  . Chlorhexidine Gluconate Cloth  6 each Topical Daily  . dextromethorphan-guaiFENesin  2 tablet Oral BID  . folic acid  2 mg Oral Daily  . ibuprofen  400 mg Oral Once  . insulin aspart  0-9 Units Subcutaneous TID WC  . mouth rinse  15 mL Mouth Rinse BID  . methylPREDNISolone (SOLU-MEDROL) injection  40 mg Intravenous Q12H  .  morphine injection  1 mg Intravenous Once  . pantoprazole  40 mg Oral BID  . primaquine  30 mg Oral Daily  . sodium bicarbonate  650 mg Oral TID   Continuous Infusions: . sodium chloride    . ceFEPime  (MAXIPIME) IV Stopped (08/06/18 0226)  . clindamycin (CLEOCIN) IV Stopped (08/06/18 0540)     LOS: 4 days   Geradine Girt  [facility]   How to contact the [facility] Attending or Consulting provider [address] or covering provider during after hours [place], for this patient?  1. Check the care team in [facility] and look for a) attending/consulting TR[facility] provider listed and b) the [facility] team listed 2. Log into [email or web address] and use [place]'s universal password to access. If you do not have the password, please contact the hospital operator. 3. Locate the [facility] provider you are looking for under [facility] and page to a number that you can be directly reached. 4. If you still have difficulty reaching the provider, please page the [facility] (Director on Call) for the Hospitalists listed on amion for assistance.  08/06/2018, 8:08 AM

## 2018-08-06 NOTE — Progress Notes (Signed)
D/w Dr Eliseo Squires  Autoimmune panel - pending Vasculitis pnael - pending Stable on Box o2  Plan - pccm will review again 08/07/18 - Support BiPAP Qhs to  Help work of breathing     SIGNATURE    Dr. Brand Males, M.D., F.C.C.P,  Pulmonary and Critical Care Medicine Staff Physician, [place] Director - Interstitial Lung Disease  Program  Pulmonary [place] [facility] [place], [place], [address]  Pager: [phone], If no answer or between  15:00h - 7:00h: call [phone] Telephone: [phone]  8:41 AM 08/06/2018

## 2018-08-06 NOTE — Progress Notes (Signed)
Patient has had elevated HR and Resp for greater than 24 hours. Reported that MD was aware. Notified NP on call for advisement on any needed intervention.

## 2018-08-06 NOTE — Progress Notes (Signed)
CRITICAL VALUE ALERT  Critical Value:  Lactic Acid 3.5  Date & Time Notied:  08/06/18 0405  Provider Notified: Schorr  Orders Received/Actions taken: Awaiting orders

## 2018-08-07 LAB — CULTURE, BLOOD (ROUTINE X 2)
Culture: NO GROWTH
Culture: NO GROWTH
Special Requests: ADEQUATE
Special Requests: ADEQUATE

## 2018-08-07 LAB — PREPARE PLATELET PHERESIS: Unit division: 0

## 2018-08-07 LAB — CBC WITH DIFFERENTIAL/PLATELET
Abs Immature Granulocytes: 0.1 10*3/uL — ABNORMAL HIGH (ref 0.00–0.07)
Band Neutrophils: 2 %
Basophils Absolute: 0 10*3/uL (ref 0.0–0.1)
Basophils Relative: 0 %
Eosinophils Absolute: 0 10*3/uL (ref 0.0–0.5)
Eosinophils Relative: 0 %
HCT: 28.6 % — ABNORMAL LOW (ref 36.0–46.0)
Hemoglobin: 9.4 g/dL — ABNORMAL LOW (ref 12.0–15.0)
Lymphocytes Relative: 6 %
Lymphs Abs: 0.9 10*3/uL (ref 0.7–4.0)
MCH: 29.9 pg (ref 26.0–34.0)
MCHC: 32.9 g/dL (ref 30.0–36.0)
MCV: 91.1 fL (ref 80.0–100.0)
Metamyelocytes Relative: 1 %
Monocytes Absolute: 0.3 10*3/uL (ref 0.1–1.0)
Monocytes Relative: 2 %
Neutro Abs: 12.9 10*3/uL — ABNORMAL HIGH (ref 1.7–7.7)
Neutrophils Relative %: 89 %
Platelets: 36 10*3/uL — ABNORMAL LOW (ref 150–400)
RBC: 3.14 MIL/uL — ABNORMAL LOW (ref 3.87–5.11)
RDW: 21.5 % — ABNORMAL HIGH (ref 11.5–15.5)
WBC: 14.2 10*3/uL — ABNORMAL HIGH (ref 4.0–10.5)
nRBC: 3.2 % — ABNORMAL HIGH (ref 0.0–0.2)
nRBC: 9 /100 WBC — ABNORMAL HIGH

## 2018-08-07 LAB — FUNGAL ANTIBODIES PANEL, ID-BLOOD
Aspergillus flavus: NEGATIVE
Aspergillus fumigatus, IgG: NEGATIVE
Aspergillus niger: NEGATIVE
Blastomyces Abs, Qn, DID: NEGATIVE
Histoplasma Ab, Immunodiffusion: NEGATIVE

## 2018-08-07 LAB — BPAM PLATELET PHERESIS
Blood Product Expiration Date: 202007022359
ISSUE DATE / TIME: 202006301250
Unit Type and Rh: 7300

## 2018-08-07 LAB — LACTIC ACID, PLASMA
Lactic Acid, Venous: 1.7 mmol/L (ref 0.5–1.9)
Lactic Acid, Venous: 2.9 mmol/L (ref 0.5–1.9)
Lactic Acid, Venous: 3.2 mmol/L (ref 0.5–1.9)

## 2018-08-07 LAB — BASIC METABOLIC PANEL
Anion gap: 9 (ref 5–15)
BUN: 29 mg/dL — ABNORMAL HIGH (ref 6–20)
CO2: 15 mmol/L — ABNORMAL LOW (ref 22–32)
Calcium: 7.6 mg/dL — ABNORMAL LOW (ref 8.9–10.3)
Chloride: 108 mmol/L (ref 98–111)
Creatinine, Ser: 0.72 mg/dL (ref 0.44–1.00)
GFR calc Af Amer: >60 mL/min (ref >60)
GFR calc non Af Amer: >60 mL/min (ref >60)
Glucose, Bld: 198 mg/dL — ABNORMAL HIGH (ref 70–99)
Potassium: 4.9 mmol/L (ref 3.5–5.1)
Sodium: 132 mmol/L — ABNORMAL LOW (ref 135–145)

## 2018-08-07 LAB — PROCALCITONIN: Procalcitonin: 26.11 ng/mL

## 2018-08-07 LAB — CRYPTOCOCCAL ANTIGEN: Crypto Ag: NEGATIVE

## 2018-08-07 LAB — GLUCOSE, CAPILLARY
Glucose-Capillary: 158 mg/dL — ABNORMAL HIGH (ref 70–99)
Glucose-Capillary: 245 mg/dL — ABNORMAL HIGH (ref 70–99)
Glucose-Capillary: 200 mg/dL — ABNORMAL HIGH (ref 70–99)
Glucose-Capillary: 141 mg/dL — ABNORMAL HIGH (ref 70–99)

## 2018-08-07 LAB — ANGIOTENSIN CONVERTING ENZYME: Angiotensin-Converting Enzyme: 60 U/L (ref 14–82)

## 2018-08-07 LAB — GLOMERULAR BASEMENT MEMBRANE ANTIBODIES: GBM Ab: 4 units (ref 0–20)

## 2018-08-07 LAB — CYCLIC CITRUL PEPTIDE ANTIBODY, IGG/IGA: CCP Antibodies IgG/IgA: 5 units (ref 0–19)

## 2018-08-07 MED ORDER — LACTATED RINGERS IV SOLN
INTRAVENOUS | Status: AC
  Administered 2018-08-07 – 2018-08-08 (×3): via INTRAVENOUS

## 2018-08-07 MED ORDER — LACTATED RINGERS IV BOLUS
1000.0000 mL | Freq: Once | INTRAVENOUS | Status: AC
  Administered 2018-08-07: 1000 mL via INTRAVENOUS

## 2018-08-07 MED ORDER — SODIUM BICARBONATE 650 MG PO TABS
1300.0000 mg | ORAL_TABLET | Freq: Three times a day (TID) | ORAL | Status: DC
  Administered 2018-08-07 – 2018-08-16 (×29): 1300 mg via ORAL
  Filled 2018-08-07 (×29): qty 2

## 2018-08-07 MED ORDER — POLYETHYLENE GLYCOL 3350 17 G PO PACK
17.0000 g | PACK | Freq: Two times a day (BID) | ORAL | Status: DC
  Administered 2018-08-08 – 2018-08-21 (×6): 17 g via ORAL
  Filled 2018-08-07 (×20): qty 1

## 2018-08-07 NOTE — Progress Notes (Addendum)
PROGRESS NOTE    Krystal Short  JME:[id number] DOB: September 11, 1985 DOA: 08/01/2018 PCP: Lajean Manes, MD   Brief Narrative:  Krystal Short is an 33 y.o. female  with Shayan Bramhall past medical history significant for AIDS (recent CD4 count was <35 on 06/20/2018),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,questionablesarcoidosis(currently undergoing pulmonary work-up), ASCUS, and anemia.   Assessment & Plan:   Active Problems:   Hyponatremia   AIDS (acquired immune deficiency syndrome) (HCC)   Molluscum contagiosum   Symptomatic anemia   Thrombocytopenia (HCC)   Pulmonary infiltrates   Fever   Sepsis (HCC)   Acute respiratory failure with hypoxemia (HCC)   Chest pain   AKI (acute kidney injury) ([place])  Acute hypoxic respiratory failure with fever  Bilateral lung infiltrate and sepsis  -concern is for PCP- IW:LNLGXQJJHE and clindamycin as well as solumedrol - ID c/s, appreciate recs - cefepime has been d/c'd  - autoimmune and vasculitis panel unremarkable (negative GBM ab, mpo/pr-3 ab, Ro/La ab, scl-70 ab, anti CCP, RF, anti DS DNA, ANA - normal ACE and negative histoplasma antigen - fungal antibodies panel pending - negative urine strep and legionella  -COVID 19 negative -NP swab: coronavirus (229E) -BC NGTD -Parvo IgG + but IgM negative -Pneumocystis smear pending -bronchoscopy on 6/5, biopsy" SCANT LUNG PARENCHYMA WITH FIBROSIS AND REACTIVE CHANGES. - NO GRANULOMATA IDENTIFIED." -Appreciate infectious disease and pulmonology recommendations  Tachycardia  Tachypnea: mild, likely related to above.  Looks like this has been the case for several days.  Sinus on tele.  Continue to monitor.  Pt appears comfortable when at bedside.    Lactic Acidosis: elevated yesterday, but normal this morning and then on repeat elevated again.  Not sure of clinical significance of this, she appears hemodynamically stable at this time.  Will plan to repeat. Addendum: repeat elevated, pt  appears stable.  Will bolus and follow in AM, continue MIVF.  Intermittent back pain High sensitivity troponin unremarkable, CTA chest/ab negative for dissection , no large PE No midline TTP on my exam today, seems improved, continue to monitor  Hyponatremia -stable  AKI -resolved  Anemia plus thrombocytopenia -S/p prbc transfusionx3 -S/p plt x2  Per heme, suspected multifactorial and likely contribution from severe sepsis with possible secondary HLC/hemophagocytosis  - recommending B complex vitamin and folic acid - transfuse for <7 or active bleeding - transfuse platelets for <20,000 or active bleeding or in the setting of sepsis  - see heme note  Non Anion Gap Metabolic acidosis: Currently on bicarb, will increase to 1300 TID Continue to monitor closely. Possibly related to HIV and associated illnesses  AIDS CD4=10 06/05/2017 On biktarvy ID following   Hyperglycemia -on steroids -SSI  DVT prophylaxis: SCD Code Status: full  Family Communication: none at bedside Disposition Plan: pending further improvement, s/o by pulm and ID  Consultants:   ID  Pulm   Heme onc  Procedures:  Echo IMPRESSIONS    1. The left ventricle has normal systolic function, with an ejection fraction of 55-60%. The cavity size was normal. Indeterminate diastolic filling due to E-Caliope Ruppert fusion. No evidence of left ventricular regional wall motion abnormalities.  2. The right ventricle has normal systolic function. The cavity was normal. There is no increase in right ventricular wall thickness.  3. Small pericardial effusion.  4. No evidence of mitral valve stenosis. Trivial mitral regurgitation.  5. The aortic valve is tricuspid. No stenosis of the aortic valve.  6. The aortic root is normal in size and structure.  7.  The IVC was normal in size. No complete TR doppler jet so unable to estimate PA systolic pressure.  Antimicrobials:  Anti-infectives (From admission, onward)    Start     Dose/Rate Route Frequency Ordered Stop   08/04/18 1000  ceFEPIme (MAXIPIME) 2 g in sodium chloride 0.9 % 100 mL IVPB  Status:  Discontinued     2 g 200 mL/hr over 30 Minutes Intravenous Every 8 hours 08/04/18 0944 08/06/18 1028   08/03/18 0300  vancomycin (VANCOCIN) IVPB 1000 mg/200 mL premix  Status:  Discontinued     1,000 mg 200 mL/hr over 60 Minutes Intravenous Daily 08/02/18 0202 08/02/18 1527   08/02/18 1630  primaquine tablet 30 mg     30 mg Oral Daily 08/02/18 1527     08/02/18 1530  clindamycin (CLEOCIN) IVPB 900 mg     900 mg 100 mL/hr over 30 Minutes Intravenous Every 8 hours 08/02/18 1527     08/02/18 1000  ceFEPIme (MAXIPIME) 2 g in sodium chloride 0.9 % 100 mL IVPB  Status:  Discontinued     2 g 200 mL/hr over 30 Minutes Intravenous Every 12 hours 08/02/18 0202 08/04/18 0944   08/02/18 1000  azithromycin (ZITHROMAX) tablet 250 mg    Note to Pharmacy: Zpack taper as directed     250 mg Oral Daily 08/02/18 0302 08/03/18 1018   08/02/18 1000  bictegravir-emtricitabine-tenofovir AF (BIKTARVY) 50-200-25 MG per tablet 1 tablet     1 tablet Oral Daily 08/02/18 0302     08/01/18 2300  vancomycin (VANCOCIN) 1,250 mg in sodium chloride 0.9 % 250 mL IVPB     1,250 mg 166.7 mL/hr over 90 Minutes Intravenous  Once 08/01/18 2242 08/02/18 0309   08/01/18 2245  ceFEPIme (MAXIPIME) 2 g in sodium chloride 0.9 % 100 mL IVPB     2 g 200 mL/hr over 30 Minutes Intravenous NOW 08/01/18 2241 08/02/18 0058     Subjective: Back pain improved  Objective: Vitals:   08/07/18 0600 08/07/18 0700 08/07/18 0800 08/07/18 1200  BP: 111/69 107/74    Pulse: (!) 120 (!) 116    Resp: (!) 26 (!) 23    Temp:   97.7 F (36.5 C) 98 F (36.7 C)  TempSrc:   Oral Oral  SpO2: 94% 92%    Weight:      Height:        Intake/Output Summary (Last 24 hours) at 08/07/2018 1304 Last data filed at 08/07/2018 1104 Gross per 24 hour  Intake 927.43 ml  Output 2200 ml  Net -1272.57 ml   Filed Weights    08/01/18 2226 08/02/18 0103  Weight: 56.7 kg 56.7 kg    Examination:  General exam: Appears calm and comfortable  Respiratory system: unlabored Cardiovascular system: tachycardic Gastrointestinal system: Abdomen is mildly distended, soft and nontender. Central nervous system: Alert and oriented. No focal neurological deficits. Extremities: no lee No midline back tenderness Skin: No rashes, lesions or ulcers Psychiatry: Judgement and insight appear normal. Mood & affect appropriate.     Data Reviewed: I have personally reviewed following labs and imaging studies  CBC: Recent Labs  Lab 08/04/18 0237 08/05/18 0710 08/06/18 0246 08/06/18 1848 08/07/18 0254  WBC 13.7* 12.0* 13.3* 12.5* 14.2*  NEUTROABS 10.7* 8.8* 12.1* 11.5* 12.9*  HGB 8.6* 7.6* 7.5* 9.6* 9.4*  HCT 27.8* 24.9* 24.2* 29.6* 28.6*  MCV 96.9 98.0 96.8 93.1 91.1  PLT 17* 13* 12* 20* 36*   Basic Metabolic Panel: Recent Labs  Lab 08/03/18  1517 08/04/18 0230 08/04/18 0237 08/04/18 1154 08/05/18 0710 08/06/18 0246 08/07/18 0254  NA 131*  --  129* 133* 132* 133* 132*  K 4.6  --  4.7 4.3 4.4 4.8 4.9  CL 107  --  105 109 109 108 108  CO2 14*  --  14* 14* 12* 14* 15*  GLUCOSE 214*  --  124* 115* 163* 219* 198*  BUN 17  --  22* 25* 31* 30* 29*  CREATININE 1.03*  --  0.87 0.93 0.94 0.94 0.72  CALCIUM 6.9*  --  7.2* 7.3* 7.3* 7.7* 7.6*  MG 1.9 1.9  --   --   --   --   --   PHOS  --  2.7  --   --   --   --   --    GFR: Estimated Creatinine Clearance: 76.9 mL/min (by C-G formula based on SCr of 0.72 mg/dL). Liver Function Tests: Recent Labs  Lab 08/01/18 2227 08/02/18 1530  AST 44* 34  ALT 21 18  ALKPHOS 122 104  BILITOT 1.9* 1.1  PROT 6.9 6.3*  ALBUMIN 2.0* 1.9*   No results for input(s): LIPASE, AMYLASE in the last 168 hours. No results for input(s): AMMONIA in the last 168 hours. Coagulation Profile: No results for input(s): INR, PROTIME in the last 168 hours. Cardiac Enzymes: No results for  input(s): CKTOTAL, CKMB, CKMBINDEX, TROPONINI in the last 168 hours. BNP (last 3 results) No results for input(s): PROBNP in the last 8760 hours. HbA1C: No results for input(s): HGBA1C in the last 72 hours. CBG: Recent Labs  Lab 08/06/18 1122 08/06/18 1622 08/06/18 2136 08/07/18 0741 08/07/18 1154  GLUCAP 180* 200* 202* 158* 245*   Lipid Profile: No results for input(s): CHOL, HDL, LDLCALC, TRIG, CHOLHDL, LDLDIRECT in the last 72 hours. Thyroid Function Tests: No results for input(s): TSH, T4TOTAL, FREET4, T3FREE, THYROIDAB in the last 72 hours. Anemia Panel: No results for input(s): VITAMINB12, FOLATE, FERRITIN, TIBC, IRON, RETICCTPCT in the last 72 hours. Sepsis Labs: Recent Labs  Lab 08/05/18 1050 08/06/18 0246 08/06/18 1709 08/07/18 0254 08/07/18 0815 08/07/18 1053  PROCALCITON 28.65 28.16  --  26.11  --   --   LATICACIDVEN  --  3.5* 3.1*  --  1.7 2.9*    Recent Results (from the past 240 hour(s))  Novel Coronavirus, NAA ([facility])     Status: None   Collection Time: 07/30/18 10:46 AM  Result Value Ref Range Status   SARS-CoV-2, NAA Not Detected Not Detected Final    Comment: Testing was performed using the cobas(R) SARS-CoV-2 test. This test was developed and its performance characteristics determined by [employer]. This test has not been FDA cleared or approved. This test has been authorized by FDA under an Emergency Use Authorization (EUA). This test is only authorized for the duration of time the declaration that circumstances exist justifying the authorization of the emergency use of in vitro diagnostic tests for detection of SARS-CoV-2 virus and/or diagnosis of COVID-19 infection under section 564(b)(1) of the Act, 21 U.S.C. 616WVP-7(T)(0), unless the authorization is terminated or revoked sooner. When diagnostic testing is negative, the possibility of Lucile Hillmann false negative result should be considered in the context of Virdell Hoiland patient's recent exposures and  the presence of clinical signs and symptoms consistent with COVID-19. An individual without symptoms of COVID-19 and who is not shedding SARS-CoV-2 virus would expect to have Deniah Saia negati ve (not detected) result in this assay.   Blood Culture (routine x 2)  Status: None   Collection Time: 08/01/18 10:27 PM   Specimen: BLOOD  Result Value Ref Range Status   Specimen Description   Final    BLOOD SITE NOT SPECIFIED Performed at [facility], [address], [place], [place] [address]    Special Requests   Final    BOTTLES DRAWN AEROBIC AND ANAEROBIC Blood Culture adequate volume Performed at [facility] [address] [place], [place] [address]    Culture   Final    NO GROWTH 5 DAYS Performed at [facility], [place] [address], [place], [place] [address]    Report Status 08/07/2018 FINAL  Final  Fungus culture, blood     Status: None (Preliminary result)   Collection Time: 08/01/18 10:27 PM   Specimen: BLOOD  Result Value Ref Range Status   Specimen Description   Final    BLOOD SITE NOT SPECIFIED Performed at [facility] [address] [place], [place] [address]    Special Requests   Final    BOTTLES DRAWN AEROBIC AND ANAEROBIC Blood Culture adequate volume Performed at [facility] [address] [place], [place] [address]    Culture   Final    NO GROWTH 5 DAYS Performed at [facility], [place] [address], [place], [place] [address]    Report Status PENDING  Incomplete  SARS Coronavirus 2 (CEPHEID- Performed in [facility] hospital lab), Hosp Order     Status: None   Collection Time: 08/01/18 10:28 PM   Specimen: Nasopharyngeal Swab  Result Value Ref Range Status   SARS Coronavirus 2 NEGATIVE NEGATIVE Final    Comment: (NOTE) If result is NEGATIVE SARS-CoV-2 target nucleic acids are NOT DETECTED. The SARS-CoV-2 RNA is generally detectable in upper and lower  respiratory specimens during the  acute phase of infection. The lowest  concentration of SARS-CoV-2 viral copies this assay can detect is 250  copies / mL. Zanya Lindo negative result does not preclude SARS-CoV-2 infection  and should not be used as the sole basis for treatment or other  patient management decisions.  Matty Deamer negative result may occur with  improper specimen collection / handling, submission of specimen other  than nasopharyngeal swab, presence of viral mutation(s) within the  areas targeted by this assay, and inadequate number of viral copies  (<250 copies / mL). Cinde Ebert negative result must be combined with clinical  observations, patient history, and epidemiological information. If result is POSITIVE SARS-CoV-2 target nucleic acids are DETECTED. The SARS-CoV-2 RNA is generally detectable in upper and lower  respiratory specimens dur ing the acute phase of infection.  Positive  results are indicative of active infection with SARS-CoV-2.  Clinical  correlation with patient history and other diagnostic information is  necessary to determine patient infection status.  Positive results do  not rule out bacterial infection or co-infection with other viruses. If result is PRESUMPTIVE POSTIVE SARS-CoV-2 nucleic acids MAY BE PRESENT.   Addasyn Mcbreen presumptive positive result was obtained on the submitted specimen  and confirmed on repeat testing.  While 2019 novel coronavirus  (SARS-CoV-2) nucleic acids may be present in the submitted sample  additional confirmatory testing may be necessary for epidemiological  and / or clinical management purposes  to differentiate between  SARS-CoV-2 and other Sarbecovirus currently known to infect humans.  If clinically indicated additional testing with an alternate test  methodology [id number]) is advised. The SARS-CoV-2 RNA is generally  detectable in upper and lower respiratory  sp ecimens during the acute  phase of infection. The expected result is Negative. Fact Sheet for Patients:   StrictlyIdeas.no Fact Sheet for Healthcare Providers: BankingDealers.co.za This test is not yet approved or cleared by the [place] FDA and has been authorized for detection and/or diagnosis of SARS-CoV-2 by FDA under an Emergency Use Authorization (EUA).  This EUA will remain in effect (meaning this test can be used) for the duration of the COVID-19 declaration under Section 564(b)(1) of the Act, 21 U.S.C. section 360bbb-3(b)(1), unless the authorization is terminated or revoked sooner. Performed at [facility], [place] [address], [place], [place] [address]   MRSA PCR Screening     Status: None   Collection Time: 08/02/18  3:11 AM   Specimen: Nasal Mucosa; Nasopharyngeal  Result Value Ref Range Status   MRSA by PCR NEGATIVE NEGATIVE Final    Comment:        The GeneXpert MRSA Assay (FDA approved for NASAL specimens only), is one component of [place] comprehensive MRSA colonization surveillance program. It is not intended to diagnose MRSA infection nor to guide or monitor treatment for MRSA infections. Performed at [facility], [place] [address], [place], [place] [address]   Blood Culture (routine x 2)     Status: None   Collection Time: 08/02/18 11:24 AM   Specimen: BLOOD LEFT ARM  Result Value Ref Range Status   Specimen Description   Final    BLOOD LEFT ARM Performed at [facility], [address], [place], [place] [address]    Special Requests   Final    BOTTLES DRAWN AEROBIC AND ANAEROBIC Blood Culture adequate volume Performed at [facility] [address], [place], [place] [address]    Culture   Final    NO GROWTH 5 DAYS Performed at [facility], [place] [address] [place], [place] [address]    Report Status 08/07/2018 FINAL  Final  Respiratory Panel by PCR     Status: Abnormal   Collection Time: 08/02/18 12:19 PM   Specimen: Nasopharyngeal Swab;  Respiratory  Result Value Ref Range Status   Adenovirus NOT DETECTED NOT DETECTED Final   Coronavirus 229E DETECTED (Seraj Dunnam) NOT DETECTED Final    Comment: (NOTE) The Coronavirus on the Respiratory Panel, DOES NOT test for the novel  Coronavirus (2019 nCoV)    Coronavirus HKU1 NOT DETECTED NOT DETECTED Final   Coronavirus NL63 NOT DETECTED NOT DETECTED Final   Coronavirus OC43 NOT DETECTED NOT DETECTED Final   Metapneumovirus NOT DETECTED NOT DETECTED Final   Rhinovirus / Enterovirus NOT DETECTED NOT DETECTED Final   Influenza Elizzie Westergard NOT DETECTED NOT DETECTED Final   Influenza B NOT DETECTED NOT DETECTED Final   Parainfluenza Virus 1 NOT DETECTED NOT DETECTED Final   Parainfluenza Virus 2 NOT DETECTED NOT DETECTED Final   Parainfluenza Virus 3 NOT DETECTED NOT DETECTED Final   Parainfluenza Virus 4 NOT DETECTED NOT DETECTED Final   Respiratory Syncytial Virus NOT DETECTED NOT DETECTED Final   Bordetella pertussis NOT DETECTED NOT DETECTED Final   Chlamydophila pneumoniae NOT DETECTED NOT DETECTED Final   Mycoplasma pneumoniae NOT DETECTED NOT DETECTED Final    Comment: Performed at [facility], [place] [address], [place], [place] [address]         Radiology Studies: No results found.      Scheduled Meds: . B-complex with vitamin C  1 tablet Oral Daily  . bictegravir-emtricitabine-tenofovir AF  1 tablet  Oral Daily  . Chlorhexidine Gluconate Cloth  6 each Topical Daily  . dextromethorphan-guaiFENesin  2 tablet Oral BID  . folic acid  2 mg Oral Daily  . insulin aspart  0-9 Units Subcutaneous TID WC  . mouth rinse  15 mL Mouth Rinse BID  . methylPREDNISolone (SOLU-MEDROL) injection  40 mg Intravenous Q12H  . pantoprazole  40 mg Oral BID  . polyethylene glycol  17 g Oral BID  . primaquine  30 mg Oral Daily  . sodium bicarbonate  650 mg Oral TID   Continuous Infusions: . sodium chloride 100 mL/hr at 08/07/18 1104  . clindamycin (CLEOCIN) IV Stopped (08/07/18 0647)     LOS:  5 days    Time spent: over 30 min    Fayrene Helper, MD [facility] Pager AMION  If 7PM-7AM, please contact night-coverage [email or web address] Password [email or web address] 08/07/2018, 1:04 PM

## 2018-08-07 NOTE — Progress Notes (Signed)
Chart and office note reviewed in detail  > agree with a/p as outlined    

## 2018-08-08 ENCOUNTER — Inpatient Hospital Stay (HOSPITAL_COMMUNITY): Payer: 59

## 2018-08-08 LAB — COMPREHENSIVE METABOLIC PANEL
ALT: 20 U/L (ref 0–44)
AST: 36 U/L (ref 15–41)
Albumin: 1.4 g/dL — ABNORMAL LOW (ref 3.5–5.0)
Alkaline Phosphatase: 155 U/L — ABNORMAL HIGH (ref 38–126)
Anion gap: 12 (ref 5–15)
BUN: 28 mg/dL — ABNORMAL HIGH (ref 6–20)
CO2: 16 mmol/L — ABNORMAL LOW (ref 22–32)
Calcium: 7.8 mg/dL — ABNORMAL LOW (ref 8.9–10.3)
Chloride: 107 mmol/L (ref 98–111)
Creatinine, Ser: 0.78 mg/dL (ref 0.44–1.00)
GFR calc Af Amer: >60 mL/min (ref >60)
GFR calc non Af Amer: >60 mL/min (ref >60)
Glucose, Bld: 154 mg/dL — ABNORMAL HIGH (ref 70–99)
Potassium: 3.8 mmol/L (ref 3.5–5.1)
Sodium: 135 mmol/L (ref 135–145)
Total Bilirubin: 3 mg/dL — ABNORMAL HIGH (ref 0.3–1.2)
Total Protein: 6.1 g/dL — ABNORMAL LOW (ref 6.5–8.1)

## 2018-08-08 LAB — CBC
HCT: 27.3 % — ABNORMAL LOW (ref 36.0–46.0)
Hemoglobin: 8.9 g/dL — ABNORMAL LOW (ref 12.0–15.0)
MCH: 29.9 pg (ref 26.0–34.0)
MCHC: 32.6 g/dL (ref 30.0–36.0)
MCV: 91.6 fL (ref 80.0–100.0)
Platelets: 14 10*3/uL — CL (ref 150–400)
RBC: 2.98 MIL/uL — ABNORMAL LOW (ref 3.87–5.11)
RDW: 21 % — ABNORMAL HIGH (ref 11.5–15.5)
WBC: 14.5 10*3/uL — ABNORMAL HIGH (ref 4.0–10.5)
nRBC: 6 % — ABNORMAL HIGH (ref 0.0–0.2)

## 2018-08-08 LAB — GLUCOSE, CAPILLARY
Glucose-Capillary: 178 mg/dL — ABNORMAL HIGH (ref 70–99)
Glucose-Capillary: 262 mg/dL — ABNORMAL HIGH (ref 70–99)
Glucose-Capillary: 193 mg/dL — ABNORMAL HIGH (ref 70–99)

## 2018-08-08 LAB — TROPONIN I (HIGH SENSITIVITY)
Troponin I (High Sensitivity): 26 ng/L — ABNORMAL HIGH (ref <18)
Troponin I (High Sensitivity): 19 ng/L — ABNORMAL HIGH (ref <18)

## 2018-08-08 LAB — MAGNESIUM: Magnesium: 1.3 mg/dL — ABNORMAL LOW (ref 1.7–2.4)

## 2018-08-08 LAB — LACTIC ACID, PLASMA: Lactic Acid, Venous: 3.1 mmol/L (ref 0.5–1.9)

## 2018-08-08 MED ORDER — MAGNESIUM SULFATE 4 GM/100ML IV SOLN
4.0000 g | Freq: Once | INTRAVENOUS | Status: AC
  Administered 2018-08-08: 4 g via INTRAVENOUS
  Filled 2018-08-08: qty 100

## 2018-08-08 MED ORDER — ALBUMIN HUMAN 25 % IV SOLN
25.0000 g | Freq: Once | INTRAVENOUS | Status: AC
  Administered 2018-08-08: 25 g via INTRAVENOUS
  Filled 2018-08-08: qty 50

## 2018-08-08 MED ORDER — LIDOCAINE 5 % EX PTCH
2.0000 | MEDICATED_PATCH | CUTANEOUS | Status: DC
  Administered 2018-08-08 – 2018-08-13 (×6): 2 via TRANSDERMAL
  Administered 2018-08-20: 1 via TRANSDERMAL
  Filled 2018-08-08 (×15): qty 2

## 2018-08-08 MED ORDER — ENSURE ENLIVE PO LIQD
237.0000 mL | Freq: Three times a day (TID) | ORAL | Status: DC
  Administered 2018-08-08 – 2018-08-12 (×8): 237 mL via ORAL

## 2018-08-08 MED ORDER — ALBUMIN HUMAN 25 % IV SOLN
INTRAVENOUS | Status: AC
  Filled 2018-08-08: qty 50

## 2018-08-08 MED ORDER — METOPROLOL TARTRATE 25 MG PO TABS
25.0000 mg | ORAL_TABLET | Freq: Two times a day (BID) | ORAL | Status: DC
  Administered 2018-08-08 – 2018-08-10 (×6): 25 mg via ORAL
  Filled 2018-08-08 (×7): qty 1

## 2018-08-08 MED ORDER — SODIUM CHLORIDE 0.9% IV SOLUTION
Freq: Once | INTRAVENOUS | Status: AC
  Administered 2018-08-08: 11:00:00 via INTRAVENOUS

## 2018-08-08 NOTE — Progress Notes (Signed)
CRITICAL VALUE ALERT  Critical Value: Lactic Acid 3.1  Date & Time Notied: 08/08/2018  0330  Provider Notified:On call provider, Georges Mouse, notified.   Orders Received/Actions taken: Awaiting orders.

## 2018-08-08 NOTE — Progress Notes (Addendum)
PULMONARY / CRITICAL CARE MEDICINE   NAME:  Krystal Short, MRN:  [id number], DOB:  1985/05/07, LOS: 6 ADMISSION DATE:  08/01/2018, CONSULTATION DATE:  08/02/2018 REFERRING MD:  [facility], CHIEF COMPLAINT:  Sob/cough  BRIEF HISTORY:    33 y/o former smoker (quit 12/2016) with HIV admitted 6/25 with new SOB.  Evolving to moslty dry cough since 07/27/18 assoc with fever, aches in setting of most recent admit 5/28 to 07/16/18 with bordetella cultures on FOB and rx with zmax and "all better".  She returned back to normall activity levels with no need for 02 or SABA.  However, gradually worse SPB.  She had a tele-visit with Pulm NP and was treated with a Z-pak + prednisone but did not improve.  CXR on admit showed new airspace disease. RVP positive for coronavirus 229E.      Previous w/u for SOB 06/18/18 in pulmonary offfice  DOE (dyspnea on exertion) Onset April 2019 with dx of HIV and pulmonary infiltrates c/w PCP  - 06/18/2018   Walked RA  2 laps @  approx 223f each @ slow/mod pace  stopped due to light headed > sob, sats 100%   Serial chest x-rays were reviewed and are consistent with resolving opportunistic infection most likely PCP with a normal exam and the expectation that she will gradually improve.  Note however she is still anemic and her main complaint is more related to that (fatigue, lightheaded with activity). rec finish rx per ID and f/u in 3 weeks > did not return to pulmonary office as admitted with flare of same symptoms. Denies vapes or IV/inhaled drug exp  6/5 EBUS bx RUL - non diagnostic  PMH:  Hyponatremia  Anemia  CAP  HIV  PNA  PCP  Thrush - mouth, esophagus  UTI  Molluscum -face, [facility]  ENB with FNA, biopsy 07/12/2018  SIGNIFICANT EVENTS:  6/25 Admit with SOB, new infiltrates, dry cough 6/28 Much better cough, min white mucus / still sob with activity but now sats ok at rest RA.  6/29 ESR elevated but improved, O2 3L. She thinks she has  sarcoid - skin bx 2019 does not show that dx. Tbbx bx 07/12/2018 does not show that tdx  STUDIES:   CTA Chest 6/26 >> neg PE, diffuse GGO, trace pleural effusions, scattered adenopathy, splenomegaly, hepatic statosis  CULTURES:  COVID 19  6/25 >> neg  BCx2  6/25 >> negative  Bordetllea PCR  6/26 >> MRSA screen 6/26 >>neg  Resp PCR 6/26 >> positive for Coronavirus 229E    Pneumocystis 6/26  >> Fungal culture blood 6/25 >>   ANTIBIOTICS per ID :  Vanc 6/25 >> 6/26 x1 dose Bicegravir 6/25 >> Maxepime 6/25 >> 6/30 Zmax  6/26 >> 6/27 Clinda 6/26 >> Primaquine 6/26 >>   LINES/TUBES:     CONSULTANTS:  ID  6/26  PCCM 6/26    SUBJECTIVE:  Pt reports feeling ok.  States no BM in two days, abd feels bloated.  Denies  CONSTITUTIONAL: BP 110/75   Pulse (!) 134   Temp 99.4 F (37.4 C) (Oral)   Resp (!) 32   Ht _0  (1.549 m)   Wt 56.7 kg   SpO2 94%   BMI 23.62 kg/m    On ? FIO2 > RA on my exam   I/O last 3 completed shifts: In: 2562.9 [P.O.:760; I.V.:1653.5; IV Piggyback:149.4] Out: 4200 [Urine:4200]          EXAM: General: young adult female  sitting in the chair in NAD, on RA HEENT: MM pink/moist, multiple circular flesh colored lesions on face Neuro: AAOx4, speech clear, MAE CV: s1s2 rrr, no m/r/g, tachy  PULM: even/non-labored, lungs bilaterally clear PJ:KDTO, non-tender, bsx4 active  Extremities: warm/dry, no edema  Skin: no rashes or lesions   LABS    PULMONARY No results for input(s): PHART, PCO2ART, PO2ART, HCO3, TCO2, O2SAT in the last 168 hours.  Invalid input(s): PCO2, PO2  CBC Recent Labs  Lab 08/06/18 1848 08/07/18 0254 08/08/18 0240  HGB 9.6* 9.4* 8.9*  HCT 29.6* 28.6* 27.3*  WBC 12.5* 14.2* 14.5*  PLT 20* 36* 14*    COAGULATION No results for input(s): INR in the last 168 hours.  CARDIAC  No results for input(s): TROPONINI in the last 168 hours. No results for input(s): PROBNP in the last 168 hours.   CHEMISTRY Recent Labs   Lab 08/03/18 0819 08/04/18 0230  08/04/18 1154 08/05/18 0710 08/06/18 0246 08/07/18 0254 08/08/18 0240  NA 131*  --    < > 133* 132* 133* 132* 135  K 4.6  --    < > 4.3 4.4 4.8 4.9 3.8  CL 107  --    < > 109 109 108 108 107  CO2 14*  --    < > 14* 12* 14* 15* 16*  GLUCOSE 214*  --    < > 115* 163* 219* 198* 154*  BUN 17  --    < > 25* 31* 30* 29* 28*  CREATININE 1.03*  --    < > 0.93 0.94 0.94 0.72 0.78  CALCIUM 6.9*  --    < > 7.3* 7.3* 7.7* 7.6* 7.8*  MG 1.9 1.9  --   --   --   --   --  1.3*  PHOS  --  2.7  --   --   --   --   --   --    < > = values in this interval not displayed.   Estimated Creatinine Clearance: 76.9 mL/min (by C-G formula based on SCr of 0.78 mg/dL).   LIVER Recent Labs  Lab 08/01/18 2227 08/02/18 1530 08/08/18 0240  AST 44* 34 36  ALT _0 ALKPHOS 122 104 155*  BILITOT 1.9* 1.1 3.0*  PROT 6.9 6.3* 6.1*  ALBUMIN 2.0* 1.9* 1.4*    INFECTIOUS Recent Labs  Lab 08/05/18 1050 08/06/18 0246  08/07/18 0254  08/07/18 1053 08/07/18 1545 08/08/18 0240  LATICACIDVEN  --  3.5*   < >  --    < > 2.9* 3.2* 3.1*  PROCALCITON 28.65 28.16  --  26.11  --   --   --   --    < > = values in this interval not displayed.   ENDOCRINE CBG (last 3)  Recent Labs    08/07/18 1634 08/07/18 2139 08/08/18 0740  GLUCAP 200* 141* 178*    IMAGING  No results found.    RESOLVED PROBLEM LIST   ASSESSMENT AND PLAN    Bilateral Ground Glass Opacities on CT  -radiographic review from 05/2017 shows reticular infiltrates / GGO that is evolving to date -differential diagnosis includes opportunistic infection in the setting of immune compromise (PJP), viral pneumonitis in setting of coronavirus 229E (may be component but does not explain all of symptom burden dating back to May 2020, hypersensitivity pneumonitis with Bordetella exposure via dog (sleeps with her), connective tissue disease with elevated ESR, hemorrhage (no hemoptysis, hematuria or AKI to  suggest  pulmonary renal syndrome), BOOP, recurrent aspiration (though no evidence to suggest) and alveolar proteinosis.   -She does not smoke, vape or use drugs, denies inhaling substances, no occupational exposures, negative for PE, negative transbronchial biospy for sarcoid.  Non-specific findings on bone marrow biopsy  Acute Hypoxic Respiratory Failure  -off O2 7/2  Plan: Continue empiric PJP / bacterial coverage Solumedrol 40 mg IV BID, hold at current dose until patient stable off O2 x24 hours Autoimmune panel negative  Follow intermittent CXR  Aspiration precautions  Follow cultures to maturity Pulmonary hygiene  Repeat sputum for PJP, fungus, nocardia, actinomyces  If no answers from above, consider VATS biopsy  Thrombocytopenia  Splenomegaly Anemia  Elevated Lactate - ? Marker of liver dysfunction  P: Follow CBC / platelets Monitor closely for bleeding  Could consider GI  See HEME evaluation from 6/26 > non-specific findings on bone marrow biospy, no overt MDS-like changes Transfuse per ICU guidelines  Consider GI evaluation   HIV -6/5 CD4% 2,  CD4 abs 41 -6/5 CD4 T cells abs <3, CD4% 3 P: Per ID  Moderate Protein Calorie Malnutrition  P: Ensure TID   PCCM will continue to follow.  Thank you for consulting [place] to participate in her care.      Noe Gens, NP-C [facility] Pgr: [phone] or if no answer [phone] 08/08/2018, 9:02 AM

## 2018-08-08 NOTE — Progress Notes (Signed)
PROGRESS NOTE    Krystal Short  UVO:[id number] DOB: January 02, 1986 DOA: 08/01/2018 PCP: Lajean Manes, MD   Brief Narrative:  Krystal Short is an 33 y.o. female  with a past medical history significant for AIDS (recent CD4 count was <35 on 06/20/2018),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,questionablesarcoidosis(currently undergoing pulmonary work-up), ASCUS, and anemia.   Assessment & Plan:   Active Problems:   Hyponatremia   AIDS (acquired immune deficiency syndrome) (HCC)   Molluscum contagiosum   Symptomatic anemia   Thrombocytopenia (HCC)   Pulmonary infiltrates   Fever   Sepsis (HCC)   Acute respiratory failure with hypoxemia (HCC)   Chest pain   AKI (acute kidney injury) ([place])  Acute hypoxic respiratory failure with fever  Bilateral lung infiltrate and sepsis  -concern is for PCP- VQ:[id number] and clindamycin as well as solumedrol - ID c/s, appreciate recs - cefepime has been d/c'd  - autoimmune and vasculitis panel unremarkable (negative GBM ab, mpo/pr-3 ab, Ro/La ab, scl-70 ab, anti CCP, RF, anti DS DNA, ANA - normal ACE and negative histoplasma antigen - fungal antibodies panel pending - negative urine strep and legionella  -COVID 19 negative -NP swab: coronavirus (229E) -BC NGTD -Parvo IgG + but IgM negative -Pneumocystis smear pending -bronchoscopy on 6/5, biopsy" SCANT LUNG PARENCHYMA WITH FIBROSIS AND REACTIVE CHANGES. - NO GRANULOMATA IDENTIFIED." -Appreciate infectious disease and pulmonology recommendations - per pulmonology, obtain sputum for PCP, fungus, nocardia, actinomyces.  If no answers, consider VATS biopsy.   Tachycardia  Tachypnea: mild, likely related to above.  Looks like this has been the case for several days.  Sinus on tele.  Continue to monitor.  Pt appears comfortable when at bedside.  Will start on metoprolol today Will bolus with albumin given her low albumin and lower extremity edema  Lactic Acidosis: elevated  yesterday, but normal this morning and then on repeat elevated again.  Not sure of clinical significance of this, she appears hemodynamically stable at this time.  Will plan to repeat.   Stably elevated this morning.  ?poor hepatic clearance.  Will continue to monitor clinically.  Intermittent back pain High sensitivity troponin unremarkable, CTA chest/ab negative for dissection , no large PE No midline TTP on my exam today, seems mostly paraspinal, continue to monitor  Hyponatremia -stable  AKI -resolved  Elevated Troponin: suspect this may be demand in setting of her tachycardia and acute illness above.  Peaked at 81, now downtrending.  She denied any sx of CP.  Low suspicion for ACS.  Echo 6/26 with normal systolic function, EF 43-32%, no regional wall motion abnormalities (see report). Follow A1c, lipids Would recommending outpatient cards follow up Started on metop for tachy    Elevated Bilirubin  Elevated Alk Phos: mild, RUQ [place] with gallbladder sludge with asymmetric wall thickening, nonspecific.  Acute cholecystitis is unlikely in absence of gallstones or positive sonographic murphy sign.  Anemia plus thrombocytopenia -S/p prbc transfusionx3 -S/p plt x3 Per heme, suspected multifactorial and likely contribution from severe sepsis with possible secondary HLC/hemophagocytosis  - recommending B complex vitamin and folic acid - transfuse for <7 or active bleeding - transfuse platelets for <20,000 or active bleeding or in the setting of sepsis  - see heme note  Non Anion Gap Metabolic acidosis: Currently on bicarb, increased to 1300 TID Continue to monitor closely. Possibly related to HIV and associated illnesses  AIDS CD4=10 06/05/2017 On biktarvy ID following   Hyperglycemia -on steroids -SSI  SPEP ordered by PM doc as well  as 24 hr urine study  DVT prophylaxis: SCD Code Status: full  Family Communication: none at bedside Disposition Plan: pending further  improvement, s/o by pulm and ID  Consultants:   ID  Pulm   Heme onc  Procedures:  Echo IMPRESSIONS    1. The left ventricle has normal systolic function, with an ejection fraction of 55-60%. The cavity size was normal. Indeterminate diastolic filling due to E-A fusion. No evidence of left ventricular regional wall motion abnormalities.  2. The right ventricle has normal systolic function. The cavity was normal. There is no increase in right ventricular wall thickness.  3. Small pericardial effusion.  4. No evidence of mitral valve stenosis. Trivial mitral regurgitation.  5. The aortic valve is tricuspid. No stenosis of the aortic valve.  6. The aortic root is normal in size and structure.  7. The IVC was normal in size. No complete TR doppler jet so unable to estimate PA systolic pressure.  Antimicrobials:  Anti-infectives (From admission, onward)   Start     Dose/Rate Route Frequency Ordered Stop   08/04/18 1000  ceFEPIme (MAXIPIME) 2 g in sodium chloride 0.9 % 100 mL IVPB  Status:  Discontinued     2 g 200 mL/hr over 30 Minutes Intravenous Every 8 hours 08/04/18 0944 08/06/18 1028   08/03/18 0300  vancomycin (VANCOCIN) IVPB 1000 mg/200 mL premix  Status:  Discontinued     1,000 mg 200 mL/hr over 60 Minutes Intravenous Daily 08/02/18 0202 08/02/18 1527   08/02/18 1630  primaquine tablet 30 mg     30 mg Oral Daily 08/02/18 1527     08/02/18 1530  clindamycin (CLEOCIN) IVPB 900 mg     900 mg 100 mL/hr over 30 Minutes Intravenous Every 8 hours 08/02/18 1527     08/02/18 1000  ceFEPIme (MAXIPIME) 2 g in sodium chloride 0.9 % 100 mL IVPB  Status:  Discontinued     2 g 200 mL/hr over 30 Minutes Intravenous Every 12 hours 08/02/18 0202 08/04/18 0944   08/02/18 1000  azithromycin (ZITHROMAX) tablet 250 mg    Note to Pharmacy: Zpack taper as directed     250 mg Oral Daily 08/02/18 0302 08/03/18 1018   08/02/18 1000  bictegravir-emtricitabine-tenofovir AF (BIKTARVY) 50-200-25 MG  per tablet 1 tablet     1 tablet Oral Daily 08/02/18 0302     08/01/18 2300  vancomycin (VANCOCIN) 1,250 mg in sodium chloride 0.9 % 250 mL IVPB     1,250 mg 166.7 mL/hr over 90 Minutes Intravenous  Once 08/01/18 2242 08/02/18 0309   08/01/18 2245  ceFEPIme (MAXIPIME) 2 g in sodium chloride 0.9 % 100 mL IVPB     2 g 200 mL/hr over 30 Minutes Intravenous NOW 08/01/18 2241 08/02/18 0058     Subjective: Denies CP.  Notes she was able to take off o2, SOB improved. Still some LBP.    Objective: Vitals:   08/08/18 1055 08/08/18 1100 08/08/18 1120 08/08/18 1200  BP: 106/62 107/72 107/69   Pulse: (!) 107 (!) 107 (!) 108   Resp: (!) 28 (!) 33 (!) 32   Temp: 98.8 F (37.1 C)  97.9 F (36.6 C) 98.5 F (36.9 C)  TempSrc: Oral  Oral Oral  SpO2: 91% 91% 91%   Weight:      Height:        Intake/Output Summary (Last 24 hours) at 08/08/2018 1706 Last data filed at 08/08/2018 1250 Gross per 24 hour  Intake 2219.87 ml  Output 3000 ml  Net -780.13 ml   Filed Weights   08/01/18 2226 08/02/18 0103  Weight: 56.7 kg 56.7 kg    Examination:  General: No acute distress. Cardiovascular: Heart sounds show a regular tachy rate Lungs: unlabored, tachypneic Abdomen: Soft, nontender, mildly distended Neurological: Alert and oriented 3. Moves all extremities 4 with equal strength. Cranial nerves II through XII grossly intact. Skin: Warm and dry. No rashes or lesions. Extremities: bilateral LE edema  Data Reviewed: I have personally reviewed following labs and imaging studies  CBC: Recent Labs  Lab 08/04/18 0237 08/05/18 0710 08/06/18 0246 08/06/18 1848 08/07/18 0254 08/08/18 0240  WBC 13.7* 12.0* 13.3* 12.5* 14.2* 14.5*  NEUTROABS 10.7* 8.8* 12.1* 11.5* 12.9*  --   HGB 8.6* 7.6* 7.5* 9.6* 9.4* 8.9*  HCT 27.8* 24.9* 24.2* 29.6* 28.6* 27.3*  MCV 96.9 98.0 96.8 93.1 91.1 91.6  PLT 17* 13* 12* 20* 36* 14*   Basic Metabolic Panel: Recent Labs  Lab 08/03/18 0819 08/04/18 0230   08/04/18 1154 08/05/18 0710 08/06/18 0246 08/07/18 0254 08/08/18 0240  NA 131*  --    < > 133* 132* 133* 132* 135  K 4.6  --    < > 4.3 4.4 4.8 4.9 3.8  CL 107  --    < > 109 109 108 108 107  CO2 14*  --    < > 14* 12* 14* 15* 16*  GLUCOSE 214*  --    < > 115* 163* 219* 198* 154*  BUN 17  --    < > 25* 31* 30* 29* 28*  CREATININE 1.03*  --    < > 0.93 0.94 0.94 0.72 0.78  CALCIUM 6.9*  --    < > 7.3* 7.3* 7.7* 7.6* 7.8*  MG 1.9 1.9  --   --   --   --   --  1.3*  PHOS  --  2.7  --   --   --   --   --   --    < > = values in this interval not displayed.   GFR: Estimated Creatinine Clearance: 76.9 mL/min (by C-G formula based on SCr of 0.78 mg/dL). Liver Function Tests: Recent Labs  Lab 08/01/18 2227 08/02/18 1530 08/08/18 0240  AST 44* 34 36  ALT 21 18 20   ALKPHOS 122 104 155*  BILITOT 1.9* 1.1 3.0*  PROT 6.9 6.3* 6.1*  ALBUMIN 2.0* 1.9* 1.4*   No results for input(s): LIPASE, AMYLASE in the last 168 hours. No results for input(s): AMMONIA in the last 168 hours. Coagulation Profile: No results for input(s): INR, PROTIME in the last 168 hours. Cardiac Enzymes: No results for input(s): CKTOTAL, CKMB, CKMBINDEX, TROPONINI in the last 168 hours. BNP (last 3 results) No results for input(s): PROBNP in the last 8760 hours. HbA1C: No results for input(s): HGBA1C in the last 72 hours. CBG: Recent Labs  Lab 08/07/18 1154 08/07/18 1634 08/07/18 2139 08/08/18 0740 08/08/18 1207  GLUCAP 245* 200* 141* 178* 262*   Lipid Profile: No results for input(s): CHOL, HDL, LDLCALC, TRIG, CHOLHDL, LDLDIRECT in the last 72 hours. Thyroid Function Tests: No results for input(s): TSH, T4TOTAL, FREET4, T3FREE, THYROIDAB in the last 72 hours. Anemia Panel: No results for input(s): VITAMINB12, FOLATE, FERRITIN, TIBC, IRON, RETICCTPCT in the last 72 hours. Sepsis Labs: Recent Labs  Lab 08/05/18 1050 08/06/18 0246  08/07/18 0254 08/07/18 0815 08/07/18 1053 08/07/18 1545 08/08/18  0240  PROCALCITON 28.65 28.16  --  26.11  --   --   --   --  LATICACIDVEN  --  3.5*   < >  --  1.7 2.9* 3.2* 3.1*   < > = values in this interval not displayed.    Recent Results (from the past 240 hour(s))  Novel Coronavirus, NAA ([facility])     Status: None   Collection Time: 07/30/18 10:46 AM  Result Value Ref Range Status   SARS-CoV-2, NAA Not Detected Not Detected Final    Comment: Testing was performed using the cobas(R) SARS-CoV-2 test. This test was developed and its performance characteristics determined by [employer]. This test has not been FDA cleared or approved. This test has been authorized by FDA under an Emergency Use Authorization (EUA). This test is only authorized for the duration of time the declaration that circumstances exist justifying the authorization of the emergency use of in vitro diagnostic tests for detection of SARS-CoV-2 virus and/or diagnosis of COVID-19 infection under section 564(b)(1) of the Act, 21 U.S.C. 735APO-1(I)(1), unless the authorization is terminated or revoked sooner. When diagnostic testing is negative, the possibility of a false negative result should be considered in the context of a patient's recent exposures and the presence of clinical signs and symptoms consistent with COVID-19. An individual without symptoms of COVID-19 and who is not shedding SARS-CoV-2 virus would expect to have a negati ve (not detected) result in this assay.   Blood Culture (routine x 2)     Status: None   Collection Time: 08/01/18 10:27 PM   Specimen: BLOOD  Result Value Ref Range Status   Specimen Description   Final    BLOOD SITE NOT SPECIFIED Performed at [facility], [address], [place], [place] [address]    Special Requests   Final    BOTTLES DRAWN AEROBIC AND ANAEROBIC Blood Culture adequate volume Performed at [facility] [address] [place], [place] [address]    Culture   Final    NO GROWTH 5  DAYS Performed at [facility], [place] [address], [place], [place] [address]    Report Status 08/07/2018 FINAL  Final  Fungus culture, blood     Status: None (Preliminary result)   Collection Time: 08/01/18 10:27 PM   Specimen: BLOOD  Result Value Ref Range Status   Specimen Description   Final    BLOOD SITE NOT SPECIFIED Performed at [facility] [address], [place], [place] [address]    Special Requests   Final    BOTTLES DRAWN AEROBIC AND ANAEROBIC Blood Culture adequate volume Performed at [facility] [address], [place], [place] [address]    Culture   Final    NO GROWTH 6 DAYS Performed at [facility], [place] [address], [place],  [address]    Report Status PENDING  Incomplete  SARS Coronavirus 2 (CEPHEID- Performed in [facility] lab), Hosp Order     Status: None   Collection Time: 08/01/18 10:28 PM   Specimen: Nasopharyngeal Swab  Result Value Ref Range Status   SARS Coronavirus 2 NEGATIVE NEGATIVE Final    Comment: (NOTE) If result is NEGATIVE SARS-CoV-2 target nucleic acids are NOT DETECTED. The SARS-CoV-2 RNA is generally detectable in upper and lower  respiratory specimens during the acute phase of infection. The lowest  concentration of SARS-CoV-2 viral copies this assay can detect is 250  copies / mL. A negative result does not preclude SARS-CoV-2 infection  and should not be used as the sole basis for treatment or  other  patient management decisions.  A negative result may occur with  improper specimen collection / handling, submission of specimen other  than nasopharyngeal swab, presence of viral mutation(s) within the  areas targeted by this assay, and inadequate number of viral copies  (<250 copies / mL). A negative result must be combined with clinical  observations, patient history, and epidemiological information. If result is POSITIVE SARS-CoV-2 target nucleic acids are DETECTED.  The SARS-CoV-2 RNA is generally detectable in upper and lower  respiratory specimens dur ing the acute phase of infection.  Positive  results are indicative of active infection with SARS-CoV-2.  Clinical  correlation with patient history and other diagnostic information is  necessary to determine patient infection status.  Positive results do  not rule out bacterial infection or co-infection with other viruses. If result is PRESUMPTIVE POSTIVE SARS-CoV-2 nucleic acids MAY BE PRESENT.   A presumptive positive result was obtained on the submitted specimen  and confirmed on repeat testing.  While 2019 novel coronavirus  (SARS-CoV-2) nucleic acids may be present in the submitted sample  additional confirmatory testing may be necessary for epidemiological  and / or clinical management purposes  to differentiate between  SARS-CoV-2 and other Sarbecovirus currently known to infect humans.  If clinically indicated additional testing with an alternate test  methodology [phone]) is advised. The SARS-CoV-2 RNA is generally  detectable in upper and lower respiratory sp ecimens during the acute  phase of infection. The expected result is Negative. Fact Sheet for Patients:  StrictlyIdeas.no Fact Sheet for Healthcare Providers: BankingDealers.co.za This test is not yet approved or cleared by the [place] FDA and has been authorized for detection and/or diagnosis of SARS-CoV-2 by FDA under an Emergency Use Authorization (EUA).  This EUA will remain in effect (meaning this test can be used) for the duration of the COVID-19 declaration under Section 564(b)(1) of the Act, 21 U.S.C. section 360bbb-3(b)(1), unless the authorization is terminated or revoked sooner. Performed at [facility], [place] [address], [place], [place] [address]   MRSA PCR Screening     Status: None   Collection Time: 08/02/18  3:11 AM   Specimen: Nasal  Mucosa; Nasopharyngeal  Result Value Ref Range Status   MRSA by PCR NEGATIVE NEGATIVE Final    Comment:        The GeneXpert MRSA Assay (FDA approved for NASAL specimens only), is one component of a comprehensive MRSA colonization surveillance program. It is not intended to diagnose MRSA infection nor to guide or monitor treatment for MRSA infections. Performed at [facility], [place] [address] [place], [place] [address]   Blood Culture (routine x 2)     Status: None   Collection Time: 08/02/18 11:24 AM   Specimen: BLOOD LEFT ARM  Result Value Ref Range Status   Specimen Description   Final    BLOOD LEFT ARM Performed at [facility], [address] [place], [place] [address]    Special Requests   Final    BOTTLES DRAWN AEROBIC AND ANAEROBIC Blood Culture adequate volume Performed at [facility] [address], [place], [place] [address]    Culture   Final    NO GROWTH 5 DAYS Performed at [facility], [place] [address] [place], [place] [address]    Report Status 08/07/2018 FINAL  Final  Respiratory Panel by PCR     Status: Abnormal   Collection Time: 08/02/18 12:19 PM   Specimen: Nasopharyngeal  Swab; Respiratory  Result Value Ref Range Status   Adenovirus NOT DETECTED NOT DETECTED Final   Coronavirus 229E DETECTED (A) NOT DETECTED Final    Comment: (NOTE) The Coronavirus on the Respiratory Panel, DOES NOT test for the novel  Coronavirus (2019 nCoV)    Coronavirus HKU1 NOT DETECTED NOT DETECTED Final   Coronavirus NL63 NOT DETECTED NOT DETECTED Final   Coronavirus OC43 NOT DETECTED NOT DETECTED Final   Metapneumovirus NOT DETECTED NOT DETECTED Final   Rhinovirus / Enterovirus NOT DETECTED NOT DETECTED Final   Influenza A NOT DETECTED NOT DETECTED Final   Influenza B NOT DETECTED NOT DETECTED Final   Parainfluenza Virus 1 NOT DETECTED NOT DETECTED Final   Parainfluenza Virus 2 NOT DETECTED NOT DETECTED Final    Parainfluenza Virus 3 NOT DETECTED NOT DETECTED Final   Parainfluenza Virus 4 NOT DETECTED NOT DETECTED Final   Respiratory Syncytial Virus NOT DETECTED NOT DETECTED Final   Bordetella pertussis NOT DETECTED NOT DETECTED Final   Chlamydophila pneumoniae NOT DETECTED NOT DETECTED Final   Mycoplasma pneumoniae NOT DETECTED NOT DETECTED Final    Comment: Performed at [facility], [place]. [address] [place], [place] [address]         Radiology Studies: US Abdomen Limited Ruq  Result Date: 08/08/2018 CLINICAL DATA:  Elevated bilirubin. EXAM: ULTRASOUND ABDOMEN LIMITED RIGHT UPPER QUADRANT COMPARISON:  CT abdomen pelvis dated July 11, 2018. FINDINGS: Gallbladder: Sludge. No gallstones. Asymmetric wall thickening measuring up to 5 mm. No sonographic Murphy sign noted by sonographer. Common bile duct: Diameter: 3 mm, normal. Liver: No focal lesion identified. Within normal limits in parenchymal echogenicity. Portal vein is patent on color Doppler imaging with normal direction of blood flow towards the liver. Incidental note is made of splenomegaly. IMPRESSION: 1. Gallbladder sludge with asymmetric wall thickening measuring up to 5 mm, nonspecific. Acute cholecystitis is unlikely in the absence of gallstones or positive sonographic Murphy sign. 2. Splenomegaly. Electronically Signed   By: Titus Dubin M.D.   On: 08/08/2018 15:27        Scheduled Meds: . B-complex with vitamin C  1 tablet Oral Daily  . bictegravir-emtricitabine-tenofovir AF  1 tablet Oral Daily  . Chlorhexidine Gluconate Cloth  6 each Topical Daily  . dextromethorphan-guaiFENesin  2 tablet Oral BID  . feeding supplement (ENSURE ENLIVE)  237 mL Oral TID BM  . folic acid  2 mg Oral Daily  . insulin aspart  0-9 Units Subcutaneous TID WC  . mouth rinse  15 mL Mouth Rinse BID  . methylPREDNISolone (SOLU-MEDROL) injection  40 mg Intravenous Q12H  . metoprolol tartrate  25 mg Oral BID  . pantoprazole  40 mg Oral BID  .  polyethylene glycol  17 g Oral BID  . primaquine  30 mg Oral Daily  . sodium bicarbonate  1,300 mg Oral TID   Continuous Infusions: . albumin human    . clindamycin (CLEOCIN) IV Stopped (08/08/18 1440)     LOS: 6 days    Time spent: over 30 min    Fayrene Helper, MD [facility] Pager AMION  If 7PM-7AM, please contact night-coverage [email or web address] Password [email or web address] 08/08/2018, 5:06 PM

## 2018-08-08 NOTE — Progress Notes (Signed)
Provider notified of pt's critical lactic acid level and of HR that has sustained in 140's this AM while pt at rest. On call provider verbally responded, and new orders received. EKG done and in chart. Pt started on 24hr urine collection at 0510am. Will continue to monitor.

## 2018-08-08 NOTE — Progress Notes (Signed)
Brief Oncology Note:  Labs and chart have been reviewed. Patient seen. She reports vaginal spotting. No other bleeding reported. Due to receive platelets today. Still having a cough, but she reports that her cough and breathing are better.   -Thrombocytopenia like related to her acute illness.  Anticipate that her counts will begin to recover as she recovers from her acute illness. -Agree with platelet transfusion today.  Transfuse platelets for platelet count less than 10,000 or active bleeding. -Her hemoglobin remained stable.  No transfusion is indicated today.  Transfuse if hemoglobin is less than 7.0 or active bleeding. -Consider changing steroids to dexamethasone 20 mg daily for 5 days if platelet count does not start to recover in the next few days.  Mikey Bussing, DNP, AGPCNP-BC, AOCNP

## 2018-08-08 NOTE — TOC Initial Note (Signed)
Transition of Care [facility]) - Initial/Assessment Note    Patient Details  Name: Krystal Short MRN: [id number] Date of Birth: 1985/04/12  Transition of Care [facility]) CM/SW Contact:    Lynnell Catalan, RN Phone Number: 08/08/2018, 3:12 PM  Expected Discharge Plan: Home/Self Care Barriers to Discharge: Continued Medical Work up    Expected Discharge Plan and Services Expected Discharge Plan: Home/Self Care       Living arrangements for the past 2 months: Apartment                    Prior Living Arrangements/Services Living arrangements for the past 2 months: Apartment Lives with:: Spouse                   Activities of Daily Living Home Assistive Devices/Equipment: None ADL Screening (condition at time of admission) Patient's cognitive ability adequate to safely complete daily activities?: Yes Is the patient deaf or have difficulty hearing?: No Does the patient have difficulty seeing, even when wearing glasses/contacts?: No Does the patient have difficulty concentrating, remembering, or making decisions?: No Patient able to express need for assistance with ADLs?: Yes Does the patient have difficulty dressing or bathing?: No Independently performs ADLs?: Yes (appropriate for developmental age) Does the patient have difficulty walking or climbing stairs?: No Weakness of Legs: Both Weakness of Arms/Hands: None   Admission diagnosis:  Thrombocytopenia (HCC) [D69.6] Anemia, unspecified type [D64.9] Sepsis, due to unspecified organism, unspecified whether acute organ dysfunction present ([place]) [A41.9] HCAP (healthcare-associated pneumonia) [J18.9] Patient Active Problem List   Diagnosis Date Noted  . AKI (acute kidney injury) ([place])   . Acute respiratory failure with hypoxemia ([place]) 08/03/2018  . Chest pain   . Fever 08/02/2018  . HCAP (healthcare-associated pneumonia) 08/02/2018  . Sepsis ([place]) 08/02/2018  . Hemophagocytosis present in bone marrow ([place])   . Pulmonary  infiltrates 07/08/2018  . Hilar adenopathy 07/08/2018  . Acute respiratory failure with hypoxia ([place]) 07/04/2018  . Symptomatic anemia 06/10/2018  . Thrombocytopenia ([place]) 06/10/2018  . ASCUS with positive high risk HPV cervical 06/10/2018  . Medication monitoring encounter 12/31/2017  . Molluscum contagiosum 06/27/2017  . AIDS (acquired immune deficiency syndrome) ([place])   . Esophageal candidiasis ([place])   . Hyponatremia 06/03/2017   PCP:  Lajean Manes, MD Pharmacy:   [id number] - [place], [place] [address] Phone: [phone] Fax: [phone]  [place], [place] - [address] [facility]. [address] [place] [address] Phone: [phone] Fax: [place], [place] - [address] [place] [address] Phone: [phone] Fax: [phone]       Readmission Risk Interventions Readmission Risk Prevention Plan 08/08/2018  Transportation Screening Complete  Medication Review (RN Care Manager) Complete  PCP or Specialist appointment within 3-5 days of discharge Not Complete  PCP/Specialist Appt Not Complete comments Not ready for DC  HRI or [place] Not Complete  HRI or Home Care Consult Pt Refusal Comments NA  SW Recovery Care/Counseling Consult Not Complete  SW Consult Not Complete Comments NA  Palliative Care Screening Not Complete  Comments NA  Skilled Nursing Facility Not Complete  SNF Comments NA  Some recent data might be hidden

## 2018-08-08 NOTE — Progress Notes (Signed)
This RN has assumed care over the pt at this time. Agree with previous nurse's assessment. Pt request water. Given to pt. No other complaints or request at this time. Will monitor.

## 2018-08-09 DIAGNOSIS — R87613 High grade squamous intraepithelial lesion on cytologic smear of cervix (HGSIL): Secondary | ICD-10-CM | POA: Diagnosis not present

## 2018-08-09 DIAGNOSIS — Z87891 Personal history of nicotine dependence: Secondary | ICD-10-CM | POA: Diagnosis not present

## 2018-08-09 DIAGNOSIS — E860 Dehydration: Secondary | ICD-10-CM | POA: Diagnosis not present

## 2018-08-09 DIAGNOSIS — Z825 Family history of asthma and other chronic lower respiratory diseases: Secondary | ICD-10-CM | POA: Diagnosis not present

## 2018-08-09 DIAGNOSIS — Z7952 Long term (current) use of systemic steroids: Secondary | ICD-10-CM | POA: Diagnosis not present

## 2018-08-09 DIAGNOSIS — Z79899 Other long term (current) drug therapy: Secondary | ICD-10-CM | POA: Diagnosis not present

## 2018-08-09 DIAGNOSIS — A4189 Other specified sepsis: Secondary | ICD-10-CM | POA: Diagnosis not present

## 2018-08-09 DIAGNOSIS — R6520 Severe sepsis without septic shock: Secondary | ICD-10-CM | POA: Diagnosis not present

## 2018-08-09 DIAGNOSIS — R739 Hyperglycemia, unspecified: Secondary | ICD-10-CM | POA: Diagnosis not present

## 2018-08-09 LAB — GLUCOSE, CAPILLARY
Glucose-Capillary: 225 mg/dL — ABNORMAL HIGH (ref 70–99)
Glucose-Capillary: 290 mg/dL — ABNORMAL HIGH (ref 70–99)
Glucose-Capillary: 201 mg/dL — ABNORMAL HIGH (ref 70–99)
Glucose-Capillary: 252 mg/dL — ABNORMAL HIGH (ref 70–99)

## 2018-08-09 LAB — HEMOGLOBIN A1C
Hgb A1c MFr Bld: 5.5 % (ref 4.8–5.6)
Mean Plasma Glucose: 111.15 mg/dL

## 2018-08-09 LAB — CBC
HCT: 20.1 % — ABNORMAL LOW (ref 36.0–46.0)
HCT: 30.3 % — ABNORMAL LOW (ref 36.0–46.0)
Hemoglobin: 6.3 g/dL — CL (ref 12.0–15.0)
Hemoglobin: 10.1 g/dL — ABNORMAL LOW (ref 12.0–15.0)
MCH: 29.2 pg (ref 26.0–34.0)
MCH: 29.9 pg (ref 26.0–34.0)
MCHC: 31.3 g/dL (ref 30.0–36.0)
MCHC: 33.3 g/dL (ref 30.0–36.0)
MCV: 93.1 fL (ref 80.0–100.0)
MCV: 89.6 fL (ref 80.0–100.0)
Platelets: 13 10*3/uL — CL (ref 150–400)
Platelets: 8 10*3/uL — CL (ref 150–400)
RBC: 2.16 MIL/uL — ABNORMAL LOW (ref 3.87–5.11)
RBC: 3.38 MIL/uL — ABNORMAL LOW (ref 3.87–5.11)
RDW: 20.5 % — ABNORMAL HIGH (ref 11.5–15.5)
RDW: 17.2 % — ABNORMAL HIGH (ref 11.5–15.5)
WBC: 18.2 10*3/uL — ABNORMAL HIGH (ref 4.0–10.5)
WBC: 16.6 10*3/uL — ABNORMAL HIGH (ref 4.0–10.5)
nRBC: 1.8 % — ABNORMAL HIGH (ref 0.0–0.2)
nRBC: 1.7 % — ABNORMAL HIGH (ref 0.0–0.2)

## 2018-08-09 LAB — ACID FAST SMEAR (AFB, MYCOBACTERIA)

## 2018-08-09 LAB — LIPID PANEL
Cholesterol: 101 mg/dL (ref 0–200)
HDL: <10 mg/dL — ABNORMAL LOW (ref >40)
Triglycerides: 221 mg/dL — ABNORMAL HIGH (ref <150)
VLDL: 44 mg/dL — ABNORMAL HIGH (ref 0–40)

## 2018-08-09 LAB — DIFFERENTIAL
Abs Immature Granulocytes: 1.1 10*3/uL — ABNORMAL HIGH (ref 0.00–0.07)
Basophils Absolute: 0 10*3/uL (ref 0.0–0.1)
Basophils Relative: 0 %
Eosinophils Absolute: 0 10*3/uL (ref 0.0–0.5)
Eosinophils Relative: 0 %
Lymphocytes Relative: 6 %
Lymphs Abs: 1.1 10*3/uL (ref 0.7–4.0)
Monocytes Absolute: 1.5 10*3/uL — ABNORMAL HIGH (ref 0.1–1.0)
Monocytes Relative: 8 %
Myelocytes: 4 %
Neutro Abs: 14.6 10*3/uL — ABNORMAL HIGH (ref 1.7–7.7)
Neutrophils Relative %: 80 %
Promyelocytes Relative: 2 %

## 2018-08-09 LAB — PROTIME-INR
INR: 1.4 — ABNORMAL HIGH (ref 0.8–1.2)
Prothrombin Time: 17.2 seconds — ABNORMAL HIGH (ref 11.4–15.2)

## 2018-08-09 LAB — COMPREHENSIVE METABOLIC PANEL
ALT: 51 U/L — ABNORMAL HIGH (ref 0–44)
AST: 171 U/L — ABNORMAL HIGH (ref 15–41)
Albumin: 2 g/dL — ABNORMAL LOW (ref 3.5–5.0)
Alkaline Phosphatase: 301 U/L — ABNORMAL HIGH (ref 38–126)
Anion gap: 10 (ref 5–15)
BUN: 26 mg/dL — ABNORMAL HIGH (ref 6–20)
CO2: 20 mmol/L — ABNORMAL LOW (ref 22–32)
Calcium: 7.9 mg/dL — ABNORMAL LOW (ref 8.9–10.3)
Chloride: 105 mmol/L (ref 98–111)
Creatinine, Ser: 0.7 mg/dL (ref 0.44–1.00)
GFR calc Af Amer: >60 mL/min (ref >60)
GFR calc non Af Amer: >60 mL/min (ref >60)
Glucose, Bld: 168 mg/dL — ABNORMAL HIGH (ref 70–99)
Potassium: 4 mmol/L (ref 3.5–5.1)
Sodium: 135 mmol/L (ref 135–145)
Total Bilirubin: 5.2 mg/dL — ABNORMAL HIGH (ref 0.3–1.2)
Total Protein: 6.4 g/dL — ABNORMAL LOW (ref 6.5–8.1)

## 2018-08-09 LAB — FUNGUS CULTURE, BLOOD
Culture: NO GROWTH
Special Requests: ADEQUATE

## 2018-08-09 LAB — HEMOGLOBIN AND HEMATOCRIT, BLOOD
HCT: 31.2 % — ABNORMAL LOW (ref 36.0–46.0)
Hemoglobin: 10.1 g/dL — ABNORMAL LOW (ref 12.0–15.0)

## 2018-08-09 LAB — BILIRUBIN, FRACTIONATED(TOT/DIR/INDIR)
Bilirubin, Direct: 3.7 mg/dL — ABNORMAL HIGH (ref 0.0–0.2)
Indirect Bilirubin: 1.7 mg/dL — ABNORMAL HIGH (ref 0.3–0.9)
Total Bilirubin: 5.4 mg/dL — ABNORMAL HIGH (ref 0.3–1.2)

## 2018-08-09 LAB — PREPARE RBC (CROSSMATCH)

## 2018-08-09 LAB — MAGNESIUM: Magnesium: 1.7 mg/dL (ref 1.7–2.4)

## 2018-08-09 MED ORDER — SODIUM CHLORIDE 0.9% IV SOLUTION
Freq: Once | INTRAVENOUS | Status: AC
  Administered 2018-08-09: 05:00:00 via INTRAVENOUS

## 2018-08-09 NOTE — Consult Note (Signed)
Referring Provider: [place] Primary Care Physician:  Lajean Manes, MD Primary Gastroenterologist: Unassigned/[place] primary  Reason for Consultation: Abnormal LFTs  HPI: Krystal Short is a 33 y.o. female with past medical history significant for AIDS ,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,questionablesarcoidosis, ASCUS, and chronic anemia was admitted to the hospital for further evaluation of cough and dyspnea.  She is now having elevated LFTs.  GI is consulted for further evaluation.  Patient had recent work-up done for anemia and thrombocytopenia by hematology with bone marrow biopsy on May 29 with nonspecific findings.  Recent peripheral smear showed no schistocytosis.  Anemia was thought to be from severe sepsis.  Seen and examined at bedside.  She is complaining of bilateral lower quadrant discomfort since admission.  Complaining of nausea but denied any vomiting.  Denies diarrhea or constipation.  Denies any blood in the stool or black stool.  Occasional reflux, denies dysphagia and odynophagia.  Complaining of generalized weakness.  No previous EGD or colonoscopy.  No family history of colon cancer    Past Medical History:  Diagnosis Date  . Acute hyponatremia 06/03/2017  . Anemia   . CAP (community acquired pneumonia) 06/03/2017  . Community acquired pneumonia 06/05/2017  . Facial dermatitis 06/03/2017  . HIV (human immunodeficiency virus infection) ([place])   . Pleurisy 06/03/2017  . Pneumonia 06/06/2017  . 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 ([place])   . Thrush of mouth and esophagus ([place])   . UTI (urinary tract infection)     Past Surgical History:  Procedure Laterality Date  . BIOPSY  07/12/2018   Procedure: BIOPSY;  Surgeon: Laurin Coder, MD;  Location: [facility];  Service: Endoscopy;;  . BRONCHIAL WASHINGS  07/12/2018   Procedure: BRONCHIAL WASHINGS;  Surgeon: Laurin Coder, MD;  Location: [facility];  Service: Endoscopy;;  .  ENDOBRONCHIAL ULTRASOUND N/A 07/12/2018   Procedure: ENDOBRONCHIAL ULTRASOUND;  Surgeon: Laurin Coder, MD;  Location: [facility];  Service: Endoscopy;  Laterality: N/A;  . FINE NEEDLE ASPIRATION BIOPSY  07/12/2018   Procedure: FINE NEEDLE ASPIRATION BIOPSY;  Surgeon: Laurin Coder, MD;  Location: [facility];  Service: Endoscopy;;  . NO PAST SURGERIES    . VIDEO BRONCHOSCOPY N/A 07/12/2018   Procedure: VIDEO BRONCHOSCOPY WITHOUT FLUORO;  Surgeon: Laurin Coder, MD;  Location: [facility];  Service: Endoscopy;  Laterality: N/A;    Prior to Admission medications   Medication Sig Start Date End Date Taking? Authorizing Provider  azithromycin (ZITHROMAX) 250 MG tablet Zpack taper as directed 07/30/18  Yes Martyn Ehrich, NP  benzonatate (TESSALON) 100 MG capsule Take 100-200 mg by mouth 2 (two) times daily as needed for cough.   Yes [provider]  bictegravir-emtricitabine-tenofovir AF (BIKTARVY) 50-200-25 MG TABS tablet Take 1 tablet by mouth daily. 06/20/18  Yes Golden Circle, FNP  dapsone 100 MG tablet Take 1 tablet (100 mg total) by mouth daily. 07/15/18  Yes Golden Circle, FNP  ondansetron (ZOFRAN) 4 MG tablet Take 1 tablet (4 mg total) by mouth every 6 (six) hours. 05/24/18  Yes Raylene Everts, MD  predniSONE (DELTASONE) 20 MG tablet Take 2 tabs daily x 10 days; then 1 tab daily x 10 days; then half tab daily x 10 days Patient taking differently: Take 40 mg by mouth daily with breakfast.  07/30/18  Yes Martyn Ehrich, NP  acetaminophen (TYLENOL) 325 MG tablet Take 650 mg by mouth every 6 (six) hours as needed for mild pain or  headache.    [provider]    Scheduled Meds: . B-complex with vitamin C  1 tablet Oral Daily  . bictegravir-emtricitabine-tenofovir AF  1 tablet Oral Daily  . Chlorhexidine Gluconate Cloth  6 each Topical Daily  . dextromethorphan-guaiFENesin  2 tablet Oral BID  . feeding supplement (ENSURE ENLIVE)  237 mL Oral TID  BM  . folic acid  2 mg Oral Daily  . insulin aspart  0-9 Units Subcutaneous TID WC  . lidocaine  2 patch Transdermal Q24H  . mouth rinse  15 mL Mouth Rinse BID  . methylPREDNISolone (SOLU-MEDROL) injection  40 mg Intravenous Q12H  . metoprolol tartrate  25 mg Oral BID  . pantoprazole  40 mg Oral BID  . polyethylene glycol  17 g Oral BID  . primaquine  30 mg Oral Daily  . sodium bicarbonate  1,300 mg Oral TID   Continuous Infusions: . clindamycin (CLEOCIN) IV Stopped (08/09/18 0514)   PRN Meds:.benzonatate, bisacodyl, morphine injection, nitroGLYCERIN, ondansetron (ZOFRAN) IV  Allergies as of 08/01/2018 - Review Complete 08/01/2018  Allergen Reaction Noted  . Heparin  05/28/2018    Family History  Problem Relation Age of Onset  . Healthy Father   . Healthy Mother   . Asthma Paternal Grandmother     Social History   Socioeconomic History  . Marital status: Married    Spouse name: Not on file  . Number of children: Not on file  . Years of education: college  . Highest education level: Bachelor's degree (e.g., BA, AB, BS)  Occupational History  . Occupation: [employer]    Employer: [employer]  . Financial resource strain: Not hard at all  . Food insecurity    Worry: Never true    Inability: Never true  . Transportation needs    Medical: No    Non-medical: No  Tobacco Use  . Smoking status: Former Smoker    Packs/day: 0.25    Years: 8.00    Pack years: 2.00    Types: Cigarettes, Cigars    Quit date: 12/25/2016    Years since quitting: 1.6  . Smokeless tobacco: Never Used  Substance and Sexual Activity  . Alcohol use: Yes    Comment: weekend/socially  . Drug use: No  . Sexual activity: Not Currently    Birth control/protection: Injection  Lifestyle  . Physical activity    Days per week: 0 days    Minutes per session: 0 min  . Stress: Not at all  Relationships  . Social Herbalist on phone: Once a week    Gets  together: Once a week    Attends religious service: More than 4 times per year    Active member of club or organization: No    Attends meetings of clubs or organizations: Never    Relationship status: Married  . Intimate partner violence    Fear of current or ex partner: No    Emotionally abused: No    Physically abused: No    Forced sexual activity: No  Other Topics Concern  . Not on file  Social History Narrative   Patient does not drive, if her husband cannot provide transportation she takes uses Lyft services   She has not smoked since 2018    Review of Systems: Review of Systems  Constitutional: Positive for chills, fever and malaise/fatigue.  HENT: Negative for hearing loss and tinnitus.   Eyes: Negative for blurred vision  and double vision.  Respiratory: Positive for cough, sputum production and shortness of breath.   Cardiovascular: Positive for leg swelling. Negative for chest pain and palpitations.  Gastrointestinal: Positive for abdominal pain, heartburn and nausea. Negative for blood in stool, constipation, diarrhea and melena.  Genitourinary: Negative for dysuria and urgency.  Musculoskeletal: Positive for joint pain and myalgias.  Skin: Positive for itching. Negative for rash.  Neurological: Negative for seizures and loss of consciousness.  Endo/Heme/Allergies: Does not bruise/bleed easily.  Psychiatric/Behavioral: Negative for hallucinations. The patient is not nervous/anxious.     Physical Exam: Vital signs: Vitals:   08/09/18 1000 08/09/18 1006  BP:  114/74  Pulse:  (!) 118  Resp: (!) 33 (!) 24  Temp:  98.6 F (37 C)  SpO2:     Last BM Date: 08/09/18 Physical Exam  Constitutional: She is oriented to person, place, and time. She appears well-developed and well-nourished. No distress.  HENT:  Head: Normocephalic and atraumatic.  Mouth/Throat: Oropharyngeal exudate present.  Oral ulcers noted  Eyes: EOM are normal. Scleral icterus is present.  Neck:  Normal range of motion. Neck supple.  Cardiovascular: Normal rate, regular rhythm and normal heart sounds.  Pulmonary/Chest: Effort normal. She has wheezes. She has rales.  Abdominal: Soft. Bowel sounds are normal. She exhibits no distension. There is no abdominal tenderness. There is no rebound and no guarding.  Musculoskeletal: Normal range of motion.        General: Edema present.  Neurological: She is alert and oriented to person, place, and time.  Skin: Skin is warm. No erythema.  Psychiatric: She has a normal mood and affect. Judgment and thought content normal.   GI:  Lab Results: Recent Labs    08/07/18 0254 08/08/18 0240 08/09/18 0228  WBC 14.2* 14.5* 18.2*  HGB 9.4* 8.9* 6.3*  HCT 28.6* 27.3* 20.1*  PLT 36* 14* 13*   BMET Recent Labs    08/07/18 0254 08/08/18 0240 08/09/18 0228  NA 132* 135 135  K 4.9 3.8 4.0  CL 108 107 105  CO2 15* 16* 20*  GLUCOSE 198* 154* 168*  BUN 29* 28* 26*  CREATININE 0.72 0.78 0.70  CALCIUM 7.6* 7.8* 7.9*   LFT Recent Labs    08/09/18 0228  PROT 6.4*  ALBUMIN 2.0*  AST 171*  ALT 51*  ALKPHOS 301*  BILITOT 5.2*   PT/INR No results for input(s): LABPROT, INR in the last 72 hours.   Studies/Results: US Abdomen Limited Ruq  Result Date: 08/08/2018 CLINICAL DATA:  Elevated bilirubin. EXAM: ULTRASOUND ABDOMEN LIMITED RIGHT UPPER QUADRANT COMPARISON:  CT abdomen pelvis dated July 11, 2018. FINDINGS: Gallbladder: Sludge. No gallstones. Asymmetric wall thickening measuring up to 5 mm. No sonographic Murphy sign noted by sonographer. Common bile duct: Diameter: 3 mm, normal. Liver: No focal lesion identified. Within normal limits in parenchymal echogenicity. Portal vein is patent on color Doppler imaging with normal direction of blood flow towards the liver. Incidental note is made of splenomegaly. IMPRESSION: 1. Gallbladder sludge with asymmetric wall thickening measuring up to 5 mm, nonspecific. Acute cholecystitis is unlikely in the  absence of gallstones or positive sonographic Murphy sign. 2. Splenomegaly. Electronically Signed   By: Titus Dubin M.D.   On: 08/08/2018 15:27    Impression/Plan: -Abnormal LFTs.  AST 171, ALT 51, alkaline phosphatase 301 and total bilirubin 5.2.  Indirect bilirubin 1.7.  Hepatitis panel was negative in April 2019.  Ultrasound showed gallbladder sludge and asymmetric wall thickening of gallbladder.  CBD  3 mm.  CT scan on August 02, 2018 showed fatty liver without any pancreatic or CBD ductal dilation.  Normal-appearing pancreas. -Recurrent pulmonary infiltrate.  Followed by pulmonary/critical care as well as ID. -HIV disease.  Followed by infectious disease. -Anemia and thrombocytopenia.  Recent work-up negative by hematology.  Recommendations -------------------------- -Check HIDA scan to rule out acalculous cholecystitis.  Ultrasound and CT scan showed no evidence of biliary obstruction with CBD of 3 mm.  Hepatitis panel was negative in 2019.  Repeat hepatitis panel pending.  Her elevated LFTs could be from underlying sepsis. -Monitor LFTs.  Follow INR and hepatitis panel. -GI will follow   LOS: 7 days   Otis Brace  MD, FACP 08/09/2018, 10:24 AM  Contact #  [phone]

## 2018-08-09 NOTE — Plan of Care (Signed)

## 2018-08-09 NOTE — Progress Notes (Signed)
Initial Nutrition Assessment  INTERVENTION:   -Continue Ensure Enlive po TID, each supplement provides 350 kcal and 20 grams of protein  NUTRITION DIAGNOSIS:   Increased nutrient needs related to chronic illness as evidenced by estimated needs.  GOAL:   Patient will meet greater than or equal to 90% of their needs  MONITOR:   PO intake, Supplement acceptance, Labs, Weight trends, I & O's  REASON FOR ASSESSMENT:   Consult Assessment of nutrition requirement/status  ASSESSMENT:   33 y.o. female with past medical history significant for AIDS ,  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, questionable sarcoidosis , ASCUS, and chronic anemia was admitted to the hospital for further evaluation of cough and dyspnea.  She is now having elevated LFTs  **RD working remotely**  Patient admitted 7 days ago. No PO intakes have been documented and unable to each patient by phone at this time. Pt has been ordered Ensure supplements and pt is drinking them. Per GI note, pt complains of abdominal discomfort and some nausea.   Per weight records, weight has remained stable.   Medications: B complex with Vitamin C tablet daily, Folic acid tablet daily  Labs reviewed:  CBGs: 225-290  NUTRITION - FOCUSED PHYSICAL EXAM:  Unable to perform -working remotely.  Diet Order:   Diet Order            Diet Carb Modified Fluid consistency: Thin; Room service appropriate? Yes  Diet effective now              EDUCATION NEEDS:   No education needs have been identified at this time  Skin:  Skin Assessment: Reviewed RN Assessment  Last BM:  7/3  Height:   Ht Readings from Last 1 Encounters:  08/02/18 5\' 1"  (1.549 m)    Weight:   Wt Readings from Last 1 Encounters:  08/02/18 56.7 kg    Ideal Body Weight:  47.7 kg  BMI:  Body mass index is 23.62 kg/m.  Estimated Nutritional Needs:   Kcal:  1700-1900  Protein:  85-95g  Fluid:  1.9L/day  Clayton Bibles, MS, RD,  LDN [place] Dietitian Pager: [phone] After Hours Pager: [phone]

## 2018-08-09 NOTE — Progress Notes (Signed)
PULMONARY / CRITICAL CARE MEDICINE   NAME:  Krystal Short, MRN:  [id number], DOB:  [birth year], LOS: 7 ADMISSION DATE:  08/01/2018, CONSULTATION DATE:  08/02/2018 REFERRING MD:  [facility] hospitalist, CHIEF COMPLAINT:  Sob/cough  BRIEF HISTORY:    33 yo female former smoke with hx of HIV developed pulmonary infiltrates 03/08/18.  This persisted.  She then developed fever and was admitted from 07/04/18 to 07/16/18.  She had EBUS 07/12/18 that was negative for malignancy or granulomas.  She did grow Bordetella bronchiseptica in culture and was treated for this as well as presumptive diagnosis of sarcoidosis with steroids.  She had improvement on imaging studies and clinically, and weaned off supplemental oxygen.  She travelled to [place] 07/20/18 to 07/21/18 and on return developed fever 103F associated with barking cough.  Failed to improved with outpt antibiotics and was admitted to hospital again on 08/01/18.  RVP positive for coronavirus 229E.  PMH:  Hyponatremia, Anemia, PNA, PCP, Thrush - mouth, esophagus, UTI, Molluscum -face, seen at [facility] s/p biopsy, Epidermodysplasia verruciformis  SIGNIFICANT EVENTS:  6/25 Admit with SOB, new infiltrates, dry cough 6/28 Much better cough, min white mucus / still sob with activity but now sats ok at rest RA.  6/29 ESR elevated but improved, O2 3L.  STUDIES:   CTA Chest 6/26 >> neg PE, diffuse GGO, trace pleural effusions, scattered adenopathy, splenomegaly, hepatic statosis Serology 6/29 >> negative  CULTURES:  COVID 19  6/25 >> negative Parvovirus B19 AB >> IgG positive, IgM negative Bordetllea PCR  6/26 >> Resp PCR 6/26 >> positive for Coronavirus 229E    Pneumocystis 6/26  >> Fungal culture blood 6/25 >>   ANTIBIOTICS per ID :  Vanc 6/25 >> 6/26 x1 dose Bicegravir 6/25 >> Maxepime 6/25 >> 6/30 Zmax  6/26 >> 6/27 Clinda 6/26 >> Primaquine 6/26 >>   LINES/TUBES:     CONSULTANTS:  ID 6/26 pulmonary infiltrates/fever Hematology 6/26  anemia/thrombocytopenia  SUBJECTIVE:  Has dry cough.  Still feels fatigued, but improved compared to admission.  OBJECTIVE:  CONSTITUTIONAL: BP 105/64   Pulse (!) 122   Temp 97.6 F (36.4 C) (Oral)   Resp 16   Ht 5' 1"  (1.549 m)   Wt 56.7 kg   SpO2 93%   BMI 23.62 kg/m    On ? FIO2 > RA on my exam   I/O last 3 completed shifts: In: 1779.9 [I.V.:1508.9; IV Piggyback:271] Out: 3300 [Urine:3300]          EXAM:  General - alert Eyes - pupils reactive ENT - no sinus tenderness, no stridor Cardiac - regular rate/rhythm, no murmur Chest - b/l crackles Abdomen - soft, non tender, + bowel sounds Extremities - no cyanosis, clubbing, or edema Skin - chronic changes from history of molluscum Neuro - normal strength, moves extremities, follows commands Psych - normal mood and behavior    RESOLVED PROBLEM LIST    ASSESSMENT AND PLAN    Acute hypoxic respiratory failure recurrent pulmonary infiltrates. Discussion: She was treated for Bordetella and presumptive diagnosis of sarcoidosis in May/June 2020 with improvement in imaging studies and clinically.  She developed recurrent fever, pulmonary infiltrates and hypoxia after travel to [place] and found to be positive for coronavirus 229E.  Other culture results and serology all negative. Plan - supplemental oxygen to keep SpO2 > 92% - f/u CXR intermittently - continue solumedrol - ABx per ID - defer bronchoscopy/VATS for now  Anemia, thrombocytopenia. Discussion: Seen by hematology and felt to be related to  sepsis. Plan - f/u CBC - transfuse PRBC for Hb < 7 - transfuse PLT for count < 10K - okay to change from solumedrol to decadron if still felt necessary by hematology  Hx of HIV. Discussion: HIV1 RNA < 20 from 07/06/18.  CD4 count 41 from 07/12/18. Plan - per ID  Elevated LFTs. Plan - per primary team  LABS:   CMP Latest Ref Rng & Units 08/09/2018 08/08/2018 08/07/2018  Glucose 70 - 99 mg/dL 168(H) 154(H)  198(H)  BUN 6 - 20 mg/dL 26(H) 28(H) 29(H)  Creatinine 0.44 - 1.00 mg/dL 0.70 0.78 0.72  Sodium 135 - 145 mmol/L 135 135 132(L)  Potassium 3.5 - 5.1 mmol/L 4.0 3.8 4.9  Chloride 98 - 111 mmol/L 105 107 108  CO2 22 - 32 mmol/L 20(L) 16(L) 15(L)  Calcium 8.9 - 10.3 mg/dL 7.9(L) 7.8(L) 7.6(L)  Total Protein 6.5 - 8.1 g/dL 6.4(L) 6.1(L) -  Total Bilirubin 0.3 - 1.2 mg/dL 5.2(H) 3.0(H) -  Alkaline Phos 38 - 126 U/L 301(H) 155(H) -  AST 15 - 41 U/L 171(H) 36 -  ALT 0 - 44 U/L 51(H) 20 -   CBC Latest Ref Rng & Units 08/09/2018 08/08/2018 08/07/2018  WBC 4.0 - 10.5 K/uL 18.2(H) 14.5(H) 14.2(H)  Hemoglobin 12.0 - 15.0 g/dL 6.3(LL) 8.9(L) 9.4(L)  Hematocrit 36.0 - 46.0 % 20.1(L) 27.3(L) 28.6(L)  Platelets 150 - 400 K/uL 13(LL) 14(LL) 36(L)   ABG    Component Value Date/Time   HCO3 16.9 (L) 05/28/2018 1651   ACIDBASEDEF 7.1 (H) 05/28/2018 1651   O2SAT 72.7 05/28/2018 1651   CBG (last 3)  Recent Labs    08/08/18 0740 08/08/18 1207 08/08/18 Hampton Bays, MD [facility] Pulmonary/Critical Care 08/09/2018, 8:02 AM

## 2018-08-09 NOTE — Progress Notes (Signed)
**Note De-Identified vi Obfusction** PROGRESS NOTE    Krystal Short  QQP:[id number] DOB: 03/25/1985 DOA: 08/01/2018 PCP: Ljen Mnes, MD   Brief Nrrtive:  Consndr Lske is n 33 y.o. femle  with  pst medicl history significnt for AIDS (recent CD4 count ws <35 on 06/20/2018),Pneumoni of both lungs due to Pneumocystis jirovecii,questionblesrcoidosis(currently undergoing pulmonry work-up), ASCUS, nd nemi.   Assessment & Pln:   Active Problems:   Hypontremi   AIDS (cquired immune deficiency syndrome) (HCC)   Molluscum contgiosum   Symptomtic nemi   Thrombocytopeni (HCC)   Pulmonry infiltrtes   Fever   Sepsis (HCC)   Acute respirtory filure with hypoxemi (HCC)   Chest pin   AKI (cute kidney injury) ([place])  Acute hypoxic respirtory filure with fever  Bilterl lung infiltrte nd sepsis  -concern is for PCP- ZT:[id number] nd clindmycin s well s solumedrol - ID c/s, pprecite recs - cefepime hs been d/c'd  - utoimmune nd vsculitis pnel unremrkble (negtive GBM b, mpo/pr-3 b, Ro/L b, scl-70 b, nti CCP, RF, nti DS DNA, ANA - norml ACE nd negtive histoplsm ntigen - fungl ntibodies pnel pending - per ID (7/2 note), f/u expnded serologies for dimorphic fungl infection.  Follow AFB blood cx. - negtive urine strep nd legionell  -COVID 19 negtive -NP swb: coronvirus (229E) -BC NGTD -Prvo IgG + but IgM negtive -Pneumocystis smer pending -bronchoscopy on 6/5, biopsy" SCANT LUNG PARENCHYMA WITH FIBROSIS AND REACTIVE CHANGES. - NO GRANULOMATA IDENTIFIED." -Apprecite infectious disese nd pulmonology recommendtions - per pulmonology, obtin sputum for PCP, fungus, nocrdi, ctinomyces.  If no nswers, consider VATS biopsy.   Tchycrdi  Tchypne: mild, likely relted to bove.  Looks like this hs been the cse for severl dys.  Sinus on tele.  Continue to monitor.  Pt ppers comfortble when t bedside.  Will strt on metoprolol tody   Lctic Acidosis: elevted yesterdy, but norml this morning nd then on repet elevted gin.  Not sure of clinicl significnce of this, she ppers hemodynmiclly stble t this time.  Will pln to repet.   Stbly elevted this morning.  ?poor heptic clernce.  Will continue to monitor cliniclly.  Elevted LFT's  Elevted Bilirubin  Elevted Alk Phos  Jundice:  Kore with gllbldder sludge with symmetric wll thickening mesuring up to 5 mm. GI consult, pprecite recs -> recommending HIDA scn INR is 1.4, follow Acute heptitis pnel pending    Intermittent bck pin High sensitivity troponin unremrkble, CTA chest/b negtive for dissection , no lrge PE No midline TTP on my exm tody, seems mostly prspinl, continue to monitor  Hypontremi -stble  AKI -resolved  Elevted Troponin: suspect this my be demnd in setting of her tchycrdi nd cute illness bove.  Peked t 1, now downtrending.  She denied ny sx of CP.  Low suspicion for ACS.  Echo 6/26 with norml systolic function, EF 82-50%, no regionl wll motion bnormlities (see report). Follow A1c (wnl), lipids (LDL not clculted, totl cholesterol 101) Would recommending outptient crds follow up Strted on metop for tchy    Elevted Bilirubin  Elevted Alk Phos: mild, RUQ Kore with gllbldder sludge with symmetric wll thickening, nonspecific.  Acute cholecystitis is unlikely in bsence of gllstones or positive sonogrphic murphy sign.  Anemi plus thrombocytopeni -S/p prbc trnsfusionx5 -S/p plt x3 Per heme, suspected multifctoril nd likely contribution from severe sepsis with possible secondry HLC/hemophgocytosis  - recommending B complex vitmin nd folic cid - trnsfuse for <7 or ctive bleeding - trnsfuse pltelets for <10,000 or ctive bleeding or  in the setting of sepsis  - see heme note  Non nion Gap Metabolic acidosis: Currently on bicarb, increased to 1300 TID Continue  to monitor closely. Possibly related to HIV and associated illnesses  IDS CD4=10 06/05/2017 On biktarvy ID following   Hyperglycemia -on steroids -SSI  SPEP ordered by PM doc as well as 24 hr urine study  DVT prophylaxis: SCD Code Status: full  Family Communication: none at bedside Disposition Plan: pending further improvement, s/o by pulm and ID  Consultants:   ID  Pulm   Heme onc  Procedures:  Echo IMPRESSIONS    1. The left ventricle has normal systolic function, with an ejection fraction of 55-60%. The cavity size was normal. Indeterminate diastolic filling due to E- fusion. No evidence of left ventricular regional wall motion abnormalities.  2. The right ventricle has normal systolic function. The cavity was normal. There is no increase in right ventricular wall thickness.  3. Small pericardial effusion.  4. No evidence of mitral valve stenosis. Trivial mitral regurgitation.  5. The aortic valve is tricuspid. No stenosis of the aortic valve.  6. The aortic root is normal in size and structure.  7. The IVC was normal in size. No complete TR doppler jet so unable to estimate P systolic pressure.  ntimicrobials:  nti-infectives (From admission, onward)   Start     Dose/Rate Route Frequency Ordered Stop   08/04/18 1000  ceFEPIme (MXIPIME) 2 g in sodium chloride 0.9 % 100 mL IVPB  Status:  Discontinued     2 g 200 mL/hr over 30 Minutes Intravenous Every 8 hours 08/04/18 0944 08/06/18 1028   08/03/18 0300  vancomycin (VNCOCIN) IVPB 1000 mg/200 mL premix  Status:  Discontinued     1,000 mg 200 mL/hr over 60 Minutes Intravenous Daily 08/02/18 0202 08/02/18 1527   08/02/18 1630  primaquine tablet 30 mg     30 mg Oral Daily 08/02/18 1527     08/02/18 1530  clindamycin (CLEOCIN) IVPB 900 mg     900 mg 100 mL/hr over 30 Minutes Intravenous Every 8 hours 08/02/18 1527     08/02/18 1000  ceFEPIme (MXIPIME) 2 g in sodium chloride 0.9 % 100 mL IVPB  Status:   Discontinued     2 g 200 mL/hr over 30 Minutes Intravenous Every 12 hours 08/02/18 0202 08/04/18 0944   08/02/18 1000  azithromycin (ZITHROMX) tablet 250 mg    Note to Pharmacy: Zpack taper as directed     250 mg Oral Daily 08/02/18 0302 08/03/18 1018   08/02/18 1000  bictegravir-emtricitabine-tenofovir F (BIKTRVY) 50-200-25 MG per tablet 1 tablet     1 tablet Oral Daily 08/02/18 0302     08/01/18 2300  vancomycin (VNCOCIN) 1,250 mg in sodium chloride 0.9 % 250 mL IVPB     1,250 mg 166.7 mL/hr over 90 Minutes Intravenous  Once 08/01/18 2242 08/02/18 0309   08/01/18 2245  ceFEPIme (MXIPIME) 2 g in sodium chloride 0.9 % 100 mL IVPB     2 g 200 mL/hr over 30 Minutes Intravenous NOW 08/01/18 2241 08/02/18 0058     Subjective: Denies CP.  Notes she was able to take off o2, SOB improved. Still some LBP.    Objective: Vitals:   08/09/18 1200 08/09/18 1400 08/09/18 1500 08/09/18 1623  BP: 107/80 123/87 (!) 128/102 121/85  Pulse: (!) 108 (!) 110 (!) 110 (!) 114  Resp: (!) 29 (!) 34 (!) 32 (!) 24  Temp:  98.1 F (36.7 **Note De-Identified vi Obfusction** C)  98.5 F (36.9 C)  TempSrc:  Orl  Orl  SpO2: 93% 95% 92% 95%  Weight:      Height:        Intke/Output Summry (Lst 24 hours) t 08/09/2018 1631 Lst dt filed t 08/09/2018 1200 Gross per 24 hour  Intke 1125 ml  Output 1950 ml  Net -825 ml   Filed Weights   08/01/18 2226 08/02/18 0103  Weight: 56.7 kg 56.7 kg    Exmintion:  Generl: No cute distress. Crdiovsculr: Hert sounds show  regulr tchy rte Lungs: unlbored, tchypneic Abdomen: Soft, nontender, mildly distended Neurologicl: Alert nd oriented 3. Moves ll extremities 4 with equl strength. Crnil nerves II through XII grossly intct. Skin: Wrm nd dry. No rshes or lesions. Extremities: bilterl LE edem  Dt Reviewed: I hve personlly reviewed following lbs nd imging studies  CBC: Recent Lbs  Lb 08/05/18 0710 08/06/18 0246 08/06/18 1848 08/07/18 0254  08/08/18 0240 08/09/18 0228  WBC 12.0* 13.3* 12.5* 14.2* 14.5* 18.2*  NEUTROABS 8.8* 12.1* 11.5* 12.9*  --  14.6*  HGB 7.6* 7.5* 9.6* 9.4* 8.9* 6.3*  HCT 24.9* 24.2* 29.6* 28.6* 27.3* 20.1*  MCV 98.0 96.8 93.1 91.1 91.6 93.1  PLT 13* 12* 20* 36* 14* 13*   Bsic Metbolic Pnel: Recent Lbs  Lb 08/03/18 0819 08/04/18 0230  08/05/18 0710 08/06/18 0246 08/07/18 0254 08/08/18 0240 08/09/18 0228  NA 131*  --    < > 132* 133* 132* 135 135  K 4.6  --    < > 4.4 4.8 4.9 3.8 4.0  CL 107  --    < > 109 108 108 107 105  CO2 14*  --    < > 12* 14* 15* 16* 20*  GLUCOSE 214*  --    < > 163* 219* 198* 154* 168*  BUN 17  --    < > 31* 30* 29* 28* 26*  CREATININE 1.03*  --    < > 0.94 0.94 0.72 0.78 0.70  CALCIUM 6.9*  --    < > 7.3* 7.7* 7.6* 7.8* 7.9*  MG 1.9 1.9  --   --   --   --  1.3* 1.7  PHOS  --  2.7  --   --   --   --   --   --    < > = vlues in this intervl not displyed.   GFR: Estimted Cretinine Clernce: 76.9 mL/min (by [employer] formul bsed on SCr of 0.7 mg/dL). Liver Function Tests: Recent Lbs  Lb 08/08/18 0240 08/09/18 0228 08/09/18 1126  AST 36 171*  --   ALT 20 51*  --   ALKPHOS 155* 301*  --   BILITOT 3.0* 5.2* 5.4*  PROT 6.1* 6.4*  --   ALBUMIN 1.4* 2.0*  --    No results for input(s): LIPASE, AMYLASE in the lst 168 hours. No results for input(s): AMMONIA in the lst 168 hours. Cogultion Profile: Recent Lbs  Lb 08/09/18 1126  INR 1.4*   Crdic Enzymes: No results for input(s): CKTOTAL, CKMB, CKMBINDEX, TROPONINI in the lst 168 hours. BNP (lst 3 results) No results for input(s): PROBNP in the lst 8760 hours. HbA1C: Recent Lbs    08/09/18 0228  HGBA1C 5.5   CBG: Recent Lbs  Lb 08/08/18 0740 08/08/18 1207 08/08/18 1706 08/09/18 0831 08/09/18 1159  GLUCAP 178* 262* 193* 225* 290*   Lipid Profile: Recent Lbs    08/09/18 0228  CHOL 101  HDL <10*  [place] NOT CLCULTED  TRIG 221*  CHOLHDL NOT CLCULTED   Thyroid  Function Tests: No results for input(s): TSH, T4TOTL, FREET4, T3FREE, THYROIDB in the last 72 hours. nemia Panel: No results for input(s): VITMINB12, FOLTE, FERRITIN, TIBC, IRON, RETICCTPCT in the last 72 hours. Sepsis Labs: Recent Labs  Lab 08/05/18 1050 08/06/18 0246  08/07/18 0254 08/07/18 0815 08/07/18 1053 08/07/18 1545 08/08/18 0240  PROCLCITON 28.65 28.16  --  26.11  --   --   --   --   LTICCIDVEN  --  3.5*   < >  --  1.7 2.9* 3.2* 3.1*   < > = values in this interval not displayed.    Recent Results (from the past 240 hour(s))  Blood Culture (routine x 2)     Status: None   Collection Time: 08/01/18 10:27 PM   Specimen: BLOOD  Result Value Ref Range Status   Specimen Description   Final    BLOOD SITE NOT SPECIFIED Performed at [facility], [address] [place], [place] [address]    Special Requests   Final    BOTTLES DRWN EROBIC ND NEROBIC Blood Culture adequate volume Performed at [facility] [address] [place], [place] [address]    Culture   Final    NO GROWTH 5 DYS Performed at [facility], [employer] [address] [place], [place] [address]    Report Status 08/07/2018 FINL  Final  Fungus culture, blood     Status: None   Collection Time: 08/01/18 10:27 PM   Specimen: BLOOD  Result Value Ref Range Status   Specimen Description   Final    BLOOD SITE NOT SPECIFIED Performed at [facility] [address], [place], [place] [address]    Special Requests   Final    BOTTLES DRWN EROBIC ND NEROBIC Blood Culture adequate volume Performed at [facility] [address], [place], [place] [address]    Culture   Final    NO GROWTH 7 DYS NO FUNGUS ISOLTED Performed at [facility], [place] [address], [place], [place] [address]    Report Status 08/09/2018 FINL  Final  SRS Coronavirus 2 (CEPHEID- Performed in [facility] lab), Hosp Order     Status: None    Collection Time: 08/01/18 10:28 PM   Specimen: Nasopharyngeal Swab  Result Value Ref Range Status   SRS Coronavirus 2 NEGTIVE NEGTIVE Final    Comment: (NOTE) If result is NEGTIVE SRS-CoV-2 target nucleic acids are NOT DETECTED. The SRS-CoV-2 RN is generally detectable in upper and lower  respiratory specimens during the acute phase of infection. The lowest  concentration of SRS-CoV-2 viral copies this assay can detect is 250  copies / mL.  negative result does not preclude SRS-CoV-2 infection  and should not be used as the sole basis for treatment or other  patient management decisions.   negative result may occur with  improper specimen collection / handling, submission of specimen other  than nasopharyngeal swab, presence of viral mutation(s) within the  areas targeted by this assay, and inadequate number of viral copies  (<250 copies / mL).  negative result must be combined with clinical  observations, patient history, and epidemiological information. If result is POSITIVE SRS-CoV-2 target nucleic acids are DETECTED. The SRS-CoV-2 RN is generally detectable in upper and lower  respiratory specimens dur ing the acute phase of infection.  Positive  results are indicative of active infection **Note De-Identified vi Obfusction** with SRS-CoV-2.  Clinicl  correltion with ptient history nd other dignostic informtion is  necessry to determine ptient infection sttus.  Positive results do  not rule out bcteril infection or co-infection with other viruses. If result is PRESUMPTIVE POSTIVE SRS-CoV-2 nucleic cids MY BE PRESENT.    presumptive positive result ws obtined on the submitted specimen  nd confirmed on repet testing.  While 2019 novel coronvirus  (SRS-CoV-2) nucleic cids my be present in the submitted smple  dditionl confirmtory testing my be necessry for epidemiologicl  nd / or clinicl mngement purposes  to differentite between  SRS-CoV-2 nd other Srbecovirus  currently known to infect humns.  If cliniclly indicted dditionl testing with n lternte test  methodology [phone]) is dvised. The SRS-CoV-2 RN is generlly  detectble in upper nd lower respirtory sp ecimens during the cute  phse of infection. The expected result is Negtive. Fct Sheet for Ptients:  StrictlyIdes.no Fct Sheet for [place] Providers: BnkingDelers.co.z This test is not yet pproved or clered by the [place] FD nd hs been uthorized for detection nd/or dignosis of SRS-CoV-2 by FD under n Emergency Use uthoriztion (EU).  This EU will remin in effect (mening this test cn be used) for the durtion of the COVID-19 declrtion under Section 564(b)(1) of the ct, 21 U.S.C. section 360bbb-3(b)(1), unless the uthoriztion is terminted or revoked sooner. Performed t [facility], [place] [address], [place], [place] [address]   MRS PCR Screening     Sttus: None   Collection Time: 08/02/18  3:11 M   Specimen: Nsl Mucos; Nsophryngel  Result Vlue Ref Rnge Sttus   MRS by PCR NEGTIVE NEGTIVE Finl    Comment:        The GeneXpert MRS ssy (FD pproved for NSL specimens only), is one component of  comprehensive MRS coloniztion surveillnce progrm. It is not intended to dignose MRS infection nor to guide or monitor tretment for MRS infections. Performed t [facility], [employer] [address], [place], [place] [address]   Blood Culture (routine x 2)     Sttus: None   Collection Time: 08/02/18 11:24 M   Specimen: BLOOD LEFT RM  Result Vlue Ref Rnge Sttus   Specimen Description   Finl    BLOOD LEFT RM Performed t [facility], [address], [place], [place] [address]    Specil Requests   Finl    BOTTLES DRWN EROBIC ND NEROBIC Blood Culture dequte volume Performed t [facility] [address] [place], [place] [address]    Culture   Finl    NO GROWTH 5 DYS Performed t [facility], [place] [address] [place], [place] [address]    Report Sttus 08/07/2018 FINL  Finl  Respirtory Pnel by PCR     Sttus: bnorml   Collection Time: 08/02/18 12:19 PM   Specimen: Nsophryngel Swb; Respirtory  Result Vlue Ref Rnge Sttus   denovirus NOT DETECTED NOT DETECTED Finl   Coronvirus 229E DETECTED () NOT DETECTED Finl    Comment: (NOTE) The Coronvirus on the Respirtory Pnel, DOES NOT test for the novel  Coronvirus (2019 nCoV)    Coronvirus HKU1 NOT DETECTED NOT DETECTED Finl   Coronvirus NL63 NOT DETECTED NOT DETECTED Finl   Coronvirus OC43 NOT DETECTED NOT DETECTED Finl   Metpneumovirus NOT DETECTED NOT DETECTED Finl   Rhinovirus / Enterovirus NOT DETECTED NOT DETECTED Finl   Influenz  NOT DETECTED NOT DETECTED Finl  Influenza B NOT DETECTED NOT DETECTED Final   Parainfluenza Virus 1 NOT DETECTED NOT DETECTED Final   Parainfluenza Virus 2 NOT DETECTED NOT DETECTED Final   Parainfluenza Virus 3 NOT DETECTED NOT DETECTED Final   Parainfluenza Virus 4 NOT DETECTED NOT DETECTED Final   Respiratory Syncytial Virus NOT DETECTED NOT DETECTED Final   Bordetella pertussis NOT DETECTED NOT DETECTED Final   Chlamydophila pneumoniae NOT DETECTED NOT DETECTED Final   Mycoplasma pneumoniae NOT DETECTED NOT DETECTED Final    Comment: Performed at [facility], [place] [address] [place], [place] [address]  Acid Fast Smear (AFB)     Status: None   Collection Time: 08/07/18  8:35 PM   Specimen: Vein; Blood  Result Value Ref Range Status   AFB Specimen Processing Concentration  Final    Comment: (NOTE) Performed At: [facility] [place], [place] [id number] Rush Farmer MD [id number]    Acid Fast Smear NOSMR  Final    Comment: (NOTE) The acid-fast bacilli (AFB) smear will not be performed due to the specimen  source (blood) or submission of an insufficient quantity of specimen.    Source (AFB) BLOOD  Final    Comment: Performed at [facility], [place] [address] [place], [place] [address]         Radiology Studies: US Abdomen Limited Ruq  Result Date: 08/08/2018 CLINICAL DATA:  Elevated bilirubin. EXAM: ULTRASOUND ABDOMEN LIMITED RIGHT UPPER QUADRANT COMPARISON:  CT abdomen pelvis dated July 11, 2018. FINDINGS: Gallbladder: Sludge. No gallstones. Asymmetric wall thickening measuring up to 5 mm. No sonographic Murphy sign noted by sonographer. Common bile duct: Diameter: 3 mm, normal. Liver: No focal lesion identified. Within normal limits in parenchymal echogenicity. Portal vein is patent on color Doppler imaging with normal direction of blood flow towards the liver. Incidental note is made of splenomegaly. IMPRESSION: 1. Gallbladder sludge with asymmetric wall thickening measuring up to 5 mm, nonspecific. Acute cholecystitis is unlikely in the absence of gallstones or positive sonographic Murphy sign. 2. Splenomegaly. Electronically Signed   By: Titus Dubin M.D.   On: 08/08/2018 15:27        Scheduled Meds: . B-complex with vitamin C  1 tablet Oral Daily  . bictegravir-emtricitabine-tenofovir AF  1 tablet Oral Daily  . Chlorhexidine Gluconate Cloth  6 each Topical Daily  . dextromethorphan-guaiFENesin  2 tablet Oral BID  . feeding supplement (ENSURE ENLIVE)  237 mL Oral TID BM  . folic acid  2 mg Oral Daily  . insulin aspart  0-9 Units Subcutaneous TID WC  . lidocaine  2 patch Transdermal Q24H  . mouth rinse  15 mL Mouth Rinse BID  . methylPREDNISolone (SOLU-MEDROL) injection  40 mg Intravenous Q12H  . metoprolol tartrate  25 mg Oral BID  . pantoprazole  40 mg Oral BID  . polyethylene glycol  17 g Oral BID  . primaquine  30 mg Oral Daily  . sodium bicarbonate  1,300 mg Oral TID   Continuous Infusions: . clindamycin (CLEOCIN) IV Stopped (08/09/18 1439)     LOS:  7 days    Time spent: over 30 min    Fayrene Helper, MD [facility] Pager AMION  If 7PM-7AM, please contact night-coverage [email or web address] Password [email or web address] 08/09/2018, 4:31 PM

## 2018-08-10 ENCOUNTER — Inpatient Hospital Stay (HOSPITAL_COMMUNITY): Payer: 59

## 2018-08-10 LAB — HEPATITIS PANEL, ACUTE
HCV Ab: 0.1 {s_co_ratio} (ref 0.0–0.9)
Hep A IgM: NEGATIVE
Hep B C IgM: NEGATIVE
Hepatitis B Surface Ag: NEGATIVE

## 2018-08-10 LAB — TYPE AND SCREEN
ABO/RH(D): O POS
Antibody Screen: NEGATIVE
Unit division: 0
Unit division: 0
Unit division: 0

## 2018-08-10 LAB — BPAM PLATELET PHERESIS
Blood Product Expiration Date: 202007042359
ISSUE DATE / TIME: 202007021101
Unit Type and Rh: 6200

## 2018-08-10 LAB — CBC
HCT: 30.1 % — ABNORMAL LOW (ref 36.0–46.0)
HCT: 29.9 % — ABNORMAL LOW (ref 36.0–46.0)
Hemoglobin: 10 g/dL — ABNORMAL LOW (ref 12.0–15.0)
Hemoglobin: 10.2 g/dL — ABNORMAL LOW (ref 12.0–15.0)
MCH: 29.2 pg (ref 26.0–34.0)
MCH: 30.1 pg (ref 26.0–34.0)
MCHC: 33.2 g/dL (ref 30.0–36.0)
MCHC: 34.1 g/dL (ref 30.0–36.0)
MCV: 88 fL (ref 80.0–100.0)
MCV: 88.2 fL (ref 80.0–100.0)
Platelets: 7 10*3/uL — CL (ref 150–400)
Platelets: 9 10*3/uL — CL (ref 150–400)
RBC: 3.42 MIL/uL — ABNORMAL LOW (ref 3.87–5.11)
RBC: 3.39 MIL/uL — ABNORMAL LOW (ref 3.87–5.11)
RDW: 17.2 % — ABNORMAL HIGH (ref 11.5–15.5)
RDW: 17.3 % — ABNORMAL HIGH (ref 11.5–15.5)
WBC: 16.1 10*3/uL — ABNORMAL HIGH (ref 4.0–10.5)
WBC: 19.4 10*3/uL — ABNORMAL HIGH (ref 4.0–10.5)
nRBC: 1.2 % — ABNORMAL HIGH (ref 0.0–0.2)
nRBC: 1.1 % — ABNORMAL HIGH (ref 0.0–0.2)

## 2018-08-10 LAB — PREPARE PLATELET PHERESIS: Unit division: 0

## 2018-08-10 LAB — GLUCOSE, CAPILLARY
Glucose-Capillary: 203 mg/dL — ABNORMAL HIGH (ref 70–99)
Glucose-Capillary: 225 mg/dL — ABNORMAL HIGH (ref 70–99)
Glucose-Capillary: 179 mg/dL — ABNORMAL HIGH (ref 70–99)
Glucose-Capillary: 254 mg/dL — ABNORMAL HIGH (ref 70–99)

## 2018-08-10 LAB — BASIC METABOLIC PANEL
Anion gap: 8 (ref 5–15)
BUN: 26 mg/dL — ABNORMAL HIGH (ref 6–20)
CO2: 24 mmol/L (ref 22–32)
Calcium: 7.6 mg/dL — ABNORMAL LOW (ref 8.9–10.3)
Chloride: 103 mmol/L (ref 98–111)
Creatinine, Ser: 0.6 mg/dL (ref 0.44–1.00)
GFR calc Af Amer: >60 mL/min (ref >60)
GFR calc non Af Amer: >60 mL/min (ref >60)
Glucose, Bld: 174 mg/dL — ABNORMAL HIGH (ref 70–99)
Potassium: 3.6 mmol/L (ref 3.5–5.1)
Sodium: 135 mmol/L (ref 135–145)

## 2018-08-10 LAB — BPAM RBC
Blood Product Expiration Date: 202007252359
Blood Product Expiration Date: 202007282359
Blood Product Expiration Date: 202008022359
ISSUE DATE / TIME: 202006301006
ISSUE DATE / TIME: 202007030613
ISSUE DATE / TIME: 202007031128
Unit Type and Rh: 5100
Unit Type and Rh: 5100
Unit Type and Rh: 5100

## 2018-08-10 LAB — HEPATIC FUNCTION PANEL
ALT: 69 U/L — ABNORMAL HIGH (ref 0–44)
AST: 100 U/L — ABNORMAL HIGH (ref 15–41)
Albumin: 1.9 g/dL — ABNORMAL LOW (ref 3.5–5.0)
Alkaline Phosphatase: 291 U/L — ABNORMAL HIGH (ref 38–126)
Bilirubin, Direct: 2.8 mg/dL — ABNORMAL HIGH (ref 0.0–0.2)
Indirect Bilirubin: 2.2 mg/dL — ABNORMAL HIGH (ref 0.3–0.9)
Total Bilirubin: 5 mg/dL — ABNORMAL HIGH (ref 0.3–1.2)
Total Protein: 6.5 g/dL (ref 6.5–8.1)

## 2018-08-10 LAB — PROTIME-INR
INR: 1.4 — ABNORMAL HIGH (ref 0.8–1.2)
Prothrombin Time: 16.7 s — ABNORMAL HIGH (ref 11.4–15.2)

## 2018-08-10 LAB — BORDETELLA PERTUSSIS PCR
B parapertussis, DNA: NEGATIVE
B pertussis, DNA: NEGATIVE

## 2018-08-10 LAB — MAGNESIUM: Magnesium: 1.5 mg/dL — ABNORMAL LOW (ref 1.7–2.4)

## 2018-08-10 MED ORDER — MAGNESIUM SULFATE 4 GM/100ML IV SOLN
4.0000 g | Freq: Once | INTRAVENOUS | Status: AC
  Administered 2018-08-10: 4 g via INTRAVENOUS
  Filled 2018-08-10 (×2): qty 100

## 2018-08-10 MED ORDER — NYSTATIN 100000 UNIT/ML MT SUSP
5.0000 mL | Freq: Four times a day (QID) | OROMUCOSAL | Status: DC
  Administered 2018-08-10 – 2018-08-22 (×37): 500000 [IU] via ORAL
  Filled 2018-08-10 (×43): qty 5

## 2018-08-10 MED ORDER — SODIUM CHLORIDE 0.9% IV SOLUTION
Freq: Once | INTRAVENOUS | Status: AC
  Administered 2018-08-10: 19:00:00 via INTRAVENOUS

## 2018-08-10 MED ORDER — SODIUM CHLORIDE 0.9% IV SOLUTION
Freq: Once | INTRAVENOUS | Status: AC
  Administered 2018-08-10: 09:00:00 via INTRAVENOUS

## 2018-08-10 NOTE — Progress Notes (Signed)
[facility] Gastroenterology Progress Note  Krystal Short 33 y.o. 1985-09-19  CC: Abnormal LFTs.   Subjective: Feeling better.  Complaining of pain while swallowing food.  Had 3 loose stools yesterday.  Denies seeing any blood in the stool.  ROS : Complaining of generalized weakness.  Baseline shortness of breath.   Objective: Vital signs in last 24 hours: Vitals:   08/10/18 0528 08/10/18 0908  BP: 125/83 (!) 129/95  Pulse: (!) 112 (!) 104  Resp: 18   Temp: 99.6 F (37.6 C)   SpO2: 100%     Physical Exam:  Head: Normocephalic and atraumatic.  Mouth/Throat: Oropharyngeal exudate present. Oral ulcers noted  Eyes: EOM are normal. Scleral icterus is present.  Neck: Normal range of motion. Neck supple.  Cardiovascular: Normal rate, regular rhythm and normal heart sounds.  Pulmonary/Chest: Effort normal. . She has rales.  Abdominal: Soft. Bowel sounds are normal. She exhibits no distension. There is no abdominal tenderness. There is no rebound and no guarding.  Musculoskeletal: .     Edema present.  Neurological: She is alert and oriented to person, place, and time.  Psychiatric: She has a normal mood and affect. Judgment and thought content normal.    Lab Results: Recent Labs    08/09/18 0228 08/10/18 0522  NA 135 135  K 4.0 3.6  CL 105 103  CO2 20* 24  GLUCOSE 168* 174*  BUN 26* 26*  CREATININE 0.70 0.60  CALCIUM 7.9* 7.6*  MG 1.7 1.5*   Recent Labs    08/09/18 0228 08/09/18 1126 08/10/18 0522  AST 171*  --  100*  ALT 51*  --  69*  ALKPHOS 301*  --  291*  BILITOT 5.2* 5.4* 5.0*  PROT 6.4*  --  6.5  ALBUMIN 2.0*  --  1.9*   Recent Labs    08/09/18 0228  08/09/18 1655 08/10/18 0522  WBC 18.2*  --  16.6* 16.1*  NEUTROABS 14.6*  --   --   --   HGB 6.3*   < > 10.1* 10.0*  HCT 20.1*   < > 30.3* 30.1*  MCV 93.1  --  89.6 88.0  PLT 13*  --  8* 7*   < > = values in this interval not displayed.   Recent Labs    08/09/18 1126 08/10/18 0522  LABPROT 17.2*  16.7*  INR 1.4* 1.4*      Assessment/Plan: -Abnormal LFTs.  AST 171, ALT 51, alkaline phosphatase 301 and total bilirubin 5.2.  Indirect bilirubin 1.7.  Hepatitis panel was negative in April 2019.  Ultrasound showed gallbladder sludge and asymmetric wall thickening of gallbladder.  CBD  3 mm.  CT scan on August 02, 2018 showed fatty liver without any pancreatic or CBD ductal dilation.  Normal-appearing pancreas. -Recurrent pulmonary infiltrate.  Followed by pulmonary/critical care as well as ID. -HIV disease.  Followed by infectious disease. -Anemia and thrombocytopenia.  Recent work-up negative by hematology.  Recommendations -------------------------- -Not able to get HIDA scan with EF today because of limited start.  Okay to switch to HIDA scan without EF.  LFTs showed little improvement but remains elevated.  -Start nystatin swish and swallow.    Ultrasound and CT scan showed no evidence of biliary obstruction with CBD of 3 mm.  Hepatitis panel was negative in 2019.  Repeat hepatitis panel pending.  Her elevated LFTs could be from underlying sepsis. -Monitor LFTs.  Follow  hepatitis panel -GI will follow   Otis Brace MD, [place] 08/10/2018, 9:57 AM  Contact #  [phone]

## 2018-08-10 NOTE — Progress Notes (Signed)
**Note De-Identified vi Obfusction** PROGRESS NOTE    Frron Lfond  KCL:[id number] DOB: Oct 14, 1985 DOA: 08/01/2018 PCP: Ljen Mnes, MD   Brief Nrrtive:  Krystal Short is n 33 y.o. femle  with  pst medicl history significnt for AIDS (recent CD4 count ws <35 on 06/20/2018),Pneumoni of both lungs due to Pneumocystis jirovecii,questionblesrcoidosis(currently undergoing pulmonry work-up), ASCUS, nd nemi.   Assessment & Pln:   Active Problems:   Hypontremi   AIDS (cquired immune deficiency syndrome) (HCC)   Molluscum contgiosum   Symptomtic nemi   Thrombocytopeni (HCC)   Pulmonry infiltrtes   Fever   Sepsis (HCC)   Acute respirtory filure with hypoxemi (HCC)   Chest pin   AKI (cute kidney injury) ([place])  Acute hypoxic respirtory filure with fever  Bilterl lung infiltrte nd sepsis  -concern is for PCP- CB:SWHQPRFFMB nd clindmycin s well s solumedrol - ID c/s, pprecite recs - cefepime hs been d/c'd  - utoimmune nd vsculitis pnel unremrkble (negtive GBM b, mpo/pr-3 b, Ro/L b, scl-70 b, nti CCP, RF, nti DS DNA, ANA - norml ACE nd negtive histoplsm ntigen - fungl ntibodies pnel pending - per ID (7/2 note), f/u expnded serologies for dimorphic fungl infection.  Follow AFB blood cx. - negtive urine strep nd legionell  -COVID 19 negtive -NP swb: coronvirus (229E) -BC NGTD -Prvo IgG + but IgM negtive -Pneumocystis smer pending -bronchoscopy on 6/5, biopsy" SCANT LUNG PARENCHYMA WITH FIBROSIS AND REACTIVE CHANGES. - NO GRANULOMATA IDENTIFIED." -Apprecite infectious disese nd pulmonology recommendtions - per pulmonology, obtin sputum for PCP, fungus, nocrdi, ctinomyces.  If no nswers, consider VATS biopsy.   Tchycrdi  Tchypne: mild, likely relted to bove.  Looks like this hs been the cse for severl dys.  Sinus on tele.  Continue to monitor.  Pt ppers comfortble when t bedside.  Strted on metoprolol, still with  mild tchy, but improved  Lctic Acidosis: elevted yesterdy, but norml this morning nd then on repet elevted gin.  Not sure of clinicl significnce of this, she ppers hemodynmiclly stble t this time.   ?poor heptic clernce.  Will continue to monitor cliniclly.  Elevted LFT's  Elevted Bilirubin  Elevted Alk Phos  Jundice:  Kore with gllbldder sludge with symmetric wll thickening mesuring up to 5 mm. GI consult, pprecite recs -> recommending HIDA scn INR is 1.4, stble, AST mildly improving tody, ALT slightly worse.  Alk phos nd bili bout the sme. Acute heptitis pnel pending    Intermittent bck pin High sensitivity troponin unremrkble, CTA chest/b negtive for dissection , no lrge PE No midline TTP on my exm tody, seems mostly prspinl, continue to monitor  Hypontremi -stble  AKI -resolved  Elevted Troponin: suspect this my be demnd in setting of her tchycrdi nd cute illness bove.  Peked t 66, now downtrending.  She denied ny sx of CP.  Low suspicion for ACS.  Echo 6/26 with norml systolic function, EF 84-66%, no regionl wll motion bnormlities (see report). Follow A1c (wnl), lipids (LDL not clculted, totl cholesterol 101) Would recommending outptient crds follow up Strted on metop for tchy    Elevted Bilirubin  Elevted Alk Phos: mild, RUQ Kore with gllbldder sludge with symmetric wll thickening, nonspecific.  Acute cholecystitis is unlikely in bsence of gllstones or positive sonogrphic murphy sign.  Anemi plus thrombocytopeni -S/p prbc trnsfusionx5 -S/p plt x3, trnsfuse 1 unit tody for pltelets <10 Per heme, suspected multifctoril nd likely contribution from severe sepsis with possible secondry HLC/hemophgocytosis  - recommending B complex vitmin nd folic cid -  transfuse for <7 or active bleeding - transfuse platelets for <10,000 or active bleeding or in the setting of sepsis  - see  heme note  Non nion Gap Metabolic acidosis: Currently on bicarb, increased to 1300 TID Continue to monitor closely, improved Possibly related to HIV and associated illnesses  IDS CD4=10 06/05/2017 On biktarvy ID following   Hyperglycemia -on steroids -SSI  Sore throat: started on nystatin with concern for thrush  SPEP ordered by PM doc as well as 24 hr urine study  DVT prophylaxis: SCD Code Status: full  Family Communication: none at bedside Disposition Plan: pending further improvement, s/o by pulm and ID  Consultants:   ID  Pulm   Heme onc  Procedures:  Echo IMPRESSIONS    1. The left ventricle has normal systolic function, with an ejection fraction of 55-60%. The cavity size was normal. Indeterminate diastolic filling due to E- fusion. No evidence of left ventricular regional wall motion abnormalities.  2. The right ventricle has normal systolic function. The cavity was normal. There is no increase in right ventricular wall thickness.  3. Small pericardial effusion.  4. No evidence of mitral valve stenosis. Trivial mitral regurgitation.  5. The aortic valve is tricuspid. No stenosis of the aortic valve.  6. The aortic root is normal in size and structure.  7. The IVC was normal in size. No complete TR doppler jet so unable to estimate P systolic pressure.  ntimicrobials:  nti-infectives (From admission, onward)   Start     Dose/Rate Route Frequency Ordered Stop   08/04/18 1000  ceFEPIme (MXIPIME) 2 g in sodium chloride 0.9 % 100 mL IVPB  Status:  Discontinued     2 g 200 mL/hr over 30 Minutes Intravenous Every 8 hours 08/04/18 0944 08/06/18 1028   08/03/18 0300  vancomycin (VNCOCIN) IVPB 1000 mg/200 mL premix  Status:  Discontinued     1,000 mg 200 mL/hr over 60 Minutes Intravenous Daily 08/02/18 0202 08/02/18 1527   08/02/18 1630  primaquine tablet 30 mg     30 mg Oral Daily 08/02/18 1527     08/02/18 1530  clindamycin (CLEOCIN) IVPB 900 mg      900 mg 100 mL/hr over 30 Minutes Intravenous Every 8 hours 08/02/18 1527     08/02/18 1000  ceFEPIme (MXIPIME) 2 g in sodium chloride 0.9 % 100 mL IVPB  Status:  Discontinued     2 g 200 mL/hr over 30 Minutes Intravenous Every 12 hours 08/02/18 0202 08/04/18 0944   08/02/18 1000  azithromycin (ZITHROMX) tablet 250 mg    Note to Pharmacy: Zpack taper as directed     250 mg Oral Daily 08/02/18 0302 08/03/18 1018   08/02/18 1000  bictegravir-emtricitabine-tenofovir F (BIKTRVY) 50-200-25 MG per tablet 1 tablet     1 tablet Oral Daily 08/02/18 0302     08/01/18 2300  vancomycin (VNCOCIN) 1,250 mg in sodium chloride 0.9 % 250 mL IVPB     1,250 mg 166.7 mL/hr over 90 Minutes Intravenous  Once 08/01/18 2242 08/02/18 0309   08/01/18 2245  ceFEPIme (MXIPIME) 2 g in sodium chloride 0.9 % 100 mL IVPB     2 g 200 mL/hr over 30 Minutes Intravenous NOW 08/01/18 2241 08/02/18 0058     Subjective: C/o sore throat today Otherwise, stable  Objective: Vitals:   08/09/18 1623 08/09/18 1950 08/10/18 0528 08/10/18 0908  BP: 121/85 (!) 123/93 125/83 (!) 129/95  Pulse: (!) 114 (!) 118 (!) 112 (!) 104 **Note De-Identified vi Obfusction** Resp: (!) 24 20 18    Temp: 98.5 F (36.9 C) 98.4 F (36.9 C) 99.6 F (37.6 C)   TempSrc: Orl Orl Orl   SpO2: 95% 96% 100%   Weight:      Height:        Intke/Output Summry (Lst 24 hours) t 08/10/2018 1237 Lst dt filed t 08/10/2018 0534 Gross per 24 hour  Intke 480 ml  Output -  Net 480 ml   Filed Weights   08/01/18 2226 08/02/18 0103  Weight: 56.7 kg 56.7 kg    Exmintion:  Generl: No cute distress. Crdiovsculr: Hert sounds show  regulr rte, nd rhythm. Lungs: Cler to usculttion bilterlly  Abdomen: Soft, nontender, nondistended Neurologicl: Alert nd oriented 3. Moves ll extremities 4. Crnil nerves II through XII grossly intct. Skin: Wrm nd dry. No rshes or lesions. Extremities: bilterl LE edem Psychitric: Mood nd ffect re norml. Insight  nd judgment re pproprite.   Dt Reviewed: I hve personlly reviewed following lbs nd imging studies  CBC: Recent Lbs  Lb 08/05/18 0710 08/06/18 0246 08/06/18 1848 08/07/18 0254 08/08/18 0240 08/09/18 0228 08/09/18 1612 08/09/18 1655 08/10/18 0522  WBC 12.0* 13.3* 12.5* 14.2* 14.5* 18.2*  --  16.6* 16.1*  NEUTROABS 8.8* 12.1* 11.5* 12.9*  --  14.6*  --   --   --   HGB 7.6* 7.5* 9.6* 9.4* 8.9* 6.3* 10.1* 10.1* 10.0*  HCT 24.9* 24.2* 29.6* 28.6* 27.3* 20.1* 31.2* 30.3* 30.1*  MCV 98.0 96.8 93.1 91.1 91.6 93.1  --  89.6 88.0  PLT 13* 12* 20* 36* 14* 13*  --  8* 7*   Bsic Metbolic Pnel: Recent Lbs  Lb 08/04/18 0230  08/06/18 0246 08/07/18 0254 08/08/18 0240 08/09/18 0228 08/10/18 0522  NA  --    < > 133* 132* 135 135 135  K  --    < > 4.8 4.9 3.8 4.0 3.6  CL  --    < > 108 108 107 105 103  CO2  --    < > 14* 15* 16* 20* 24  GLUCOSE  --    < > 219* 198* 154* 168* 174*  BUN  --    < > 30* 29* 28* 26* 26*  CREATININE  --    < > 0.94 0.72 0.78 0.70 0.60  CALCIUM  --    < > 7.7* 7.6* 7.8* 7.9* 7.6*  MG 1.9  --   --   --  1.3* 1.7 1.5*  PHOS 2.7  --   --   --   --   --   --    < > = vlues in this intervl not displyed.   GFR: Estimted Cretinine Clernce: 76.9 mL/min (by [employer] formul bsed on SCr of 0.6 mg/dL). Liver Function Tests: Recent Lbs  Lb 08/08/18 0240 08/09/18 0228 08/09/18 1126 08/10/18 0522  AST 36 171*  --  100*  ALT 20 51*  --  69*  ALKPHOS 155* 301*  --  291*  BILITOT 3.0* 5.2* 5.4* 5.0*  PROT 6.1* 6.4*  --  6.5  ALBUMIN 1.4* 2.0*  --  1.9*   No results for input(s): LIPASE, AMYLASE in the lst 168 hours. No results for input(s): AMMONIA in the lst 168 hours. Cogultion Profile: Recent Lbs  Lb 08/09/18 1126 08/10/18 0522  INR 1.4* 1.4*   Crdic Enzymes: No results for input(s): CKTOTAL, CKMB, CKMBINDEX, TROPONINI in the lst 168 hours. BNP (lst 3 results) No results for input(s): PROBNP  in the last 8760 hours.  Hb1C: Recent Labs    08/09/18 0228  HGB1C 5.5   CBG: Recent Labs  Lab 08/09/18 1159 08/09/18 1632 08/09/18 2114 08/10/18 0734 08/10/18 1145  GLUCP 290* 201* 252* 203* 225*   Lipid Profile: Recent Labs    08/09/18 0228  CHOL 101  HDL <10*  LDLCLC NOT CLCULTED  TRIG 221*  CHOLHDL NOT CLCULTED   Thyroid Function Tests: No results for input(s): TSH, T4TOTL, FREET4, T3FREE, THYROIDB in the last 72 hours. nemia Panel: No results for input(s): VITMINB12, FOLTE, FERRITIN, TIBC, IRON, RETICCTPCT in the last 72 hours. Sepsis Labs: Recent Labs  Lab 08/05/18 1050 08/06/18 0246  08/07/18 0254 08/07/18 0815 08/07/18 1053 08/07/18 1545 08/08/18 0240  PROCLCITON 28.65 28.16  --  26.11  --   --   --   --   LTICCIDVEN  --  3.5*   < >  --  1.7 2.9* 3.2* 3.1*   < > = values in this interval not displayed.    Recent Results (from the past 240 hour(s))  Blood Culture (routine x 2)     Status: None   Collection Time: 08/01/18 10:27 PM   Specimen: BLOOD  Result Value Ref Range Status   Specimen Description   Final    BLOOD SITE NOT SPECIFIED Performed at [facility], [address], [place], [place] [address]    Special Requests   Final    BOTTLES DRWN EROBIC ND NEROBIC Blood Culture adequate volume Performed at [facility] [address], [place], [place] [address]    Culture   Final    NO GROWTH 5 DYS Performed at [facility], [place] [address], [place], [place] [address]    Report Status 08/07/2018 FINL  Final  Fungus culture, blood     Status: None   Collection Time: 08/01/18 10:27 PM   Specimen: BLOOD  Result Value Ref Range Status   Specimen Description   Final    BLOOD SITE NOT SPECIFIED Performed at [facility] [address] [place], [place] [address]    Special Requests   Final    BOTTLES DRWN EROBIC ND NEROBIC Blood Culture adequate volume Performed at [facility] [address] [place], [place] [address]    Culture   Final    NO GROWTH 7 DYS NO FUNGUS ISOLTED Performed at [facility], [place] [address] [place],  [address]    Report Status 08/09/2018 FINL  Final  SRS Coronavirus 2 (CEPHEID- Performed in [facility] hospital lab), Hosp Order     Status: None   Collection Time: 08/01/18 10:28 PM   Specimen: Nasopharyngeal Swab  Result Value Ref Range Status   SRS Coronavirus 2 NEGTIVE NEGTIVE Final    Comment: (NOTE) If result is NEGTIVE SRS-CoV-2 target nucleic acids are NOT DETECTED. The SRS-CoV-2 RN is generally detectable in upper and lower  respiratory specimens during the acute phase of infection. The lowest  concentration of SRS-CoV-2 viral copies this assay can detect is 250  copies / mL.  negative result does not preclude SRS-CoV-2 infection  and should not be used as the sole basis for treatment or other  patient management decisions.   negative result may occur with  improper specimen collection / handling, submission of specimen other  than nasopharyngeal swab, presence of viral mutation(s) within the  areas targeted by this assay, and inadequate number of viral copies  (< **Note De-Identified vi Obfusction** 250 copies / mL).  negtive result must be combined with clinicl  observtions, ptient history, nd epidemiologicl informtion. If result is POSITIVE SRS-CoV-2 trget nucleic cids re DETECTED. The SRS-CoV-2 RN is generlly detectble in upper nd lower  respirtory specimens dur ing the cute phse of infection.  Positive  results re indictive of ctive infection with SRS-CoV-2.  Clinicl  correltion with ptient history nd other dignostic informtion is  necessry to determine ptient infection sttus.  Positive results do  not rule out bcteril infection or co-infection with other viruses. If result is PRESUMPTIVE POSTIVE SRS-CoV-2 nucleic cids MY BE PRESENT.    presumptive positive result ws obtined  on the submitted specimen  nd confirmed on repet testing.  While 2019 novel coronvirus  (SRS-CoV-2) nucleic cids my be present in the submitted smple  dditionl confirmtory testing my be necessry for epidemiologicl  nd / or clinicl mngement purposes  to differentite between  SRS-CoV-2 nd other Srbecovirus currently known to infect humns.  If cliniclly indicted dditionl testing with n lternte test  methodology [phone]) is dvised. The SRS-CoV-2 RN is generlly  detectble in upper nd lower respirtory sp ecimens during the cute  phse of infection. The expected result is Negtive. Fct Sheet for Ptients:  StrictlyIdes.no Fct Sheet for [facility] Providers: BnkingDelers.co.z This test is not yet pproved or clered by the [place] FD nd hs been uthorized for detection nd/or dignosis of SRS-CoV-2 by FD under n Emergency Use uthoriztion (EU).  This EU will remin in effect (mening this test cn be used) for the durtion of the COVID-19 declrtion under Section 564(b)(1) of the ct, 21 U.S.C. section 360bbb-3(b)(1), unless the uthoriztion is terminted or revoked sooner. Performed t [facility], [place] [address] [place] [address]   MRS PCR Screening     Sttus: None   Collection Time: 08/02/18  3:11 M   Specimen: Nsl Mucos; Nsophryngel  Result Vlue Ref Rnge Sttus   MRS by PCR NEGTIVE NEGTIVE Finl    Comment:        The GeneXpert MRS ssy (FD pproved for NSL specimens only), is one component of  comprehensive MRS coloniztion surveillnce progrm. It is not intended to dignose MRS infection nor to guide or monitor tretment for MRS infections. Performed t [facility], [place] [address], [place], [place] [address]   Blood Culture (routine x 2)     Sttus: None   Collection Time: 08/02/18 11:24 M    Specimen: BLOOD LEFT RM  Result Vlue Ref Rnge Sttus   Specimen Description   Finl    BLOOD LEFT RM Performed t [facility], [address] [place], [place] [address]    Specil Requests   Finl    BOTTLES DRWN EROBIC ND NEROBIC Blood Culture dequte volume Performed t [facility] [address], [place], [place] [address]    Culture   Finl    NO GROWTH 5 DYS Performed t [facility], [place] [address] [place], [place] [address]    Report Sttus 08/07/2018 FINL  Finl  Respirtory Pnel by PCR     Sttus: bnorml   Collection Time: 08/02/18 12:19 PM   Specimen: Nsophryngel Swb; Respirtory  Result Vlue Ref Rnge Sttus   denovirus NOT DETECTED NOT DETECTED Finl   Coronvirus 229E DETECTED () NOT DETECTED Finl    Comment: (NOTE) The Coronvirus on the Respirtory Pnel, DOES NOT test for the novel  Coronvirus (2019  nCoV)    Coronavirus HKU1 NOT DETECTED NOT DETECTED Final   Coronavirus NL63 NOT DETECTED NOT DETECTED Final   Coronavirus OC43 NOT DETECTED NOT DETECTED Final   Metapneumovirus NOT DETECTED NOT DETECTED Final   Rhinovirus / Enterovirus NOT DETECTED NOT DETECTED Final   Influenza  NOT DETECTED NOT DETECTED Final   Influenza B NOT DETECTED NOT DETECTED Final   Parainfluenza Virus 1 NOT DETECTED NOT DETECTED Final   Parainfluenza Virus 2 NOT DETECTED NOT DETECTED Final   Parainfluenza Virus 3 NOT DETECTED NOT DETECTED Final   Parainfluenza Virus 4 NOT DETECTED NOT DETECTED Final   Respiratory Syncytial Virus NOT DETECTED NOT DETECTED Final   Bordetella pertussis NOT DETECTED NOT DETECTED Final   Chlamydophila pneumoniae NOT DETECTED NOT DETECTED Final   Mycoplasma pneumoniae NOT DETECTED NOT DETECTED Final    Comment: Performed at [facility], [place] [address], [place], [place] [address]  cid Fast Smear (FB)     Status: None   Collection Time: 08/07/18  8:35 PM   Specimen: Vein; Blood  Result  Value Ref Range Status   FB Specimen Processing Concentration  Final    Comment: (NOTE) Performed t: [facility] [place], [place] [id number] Rush Farmer MD YT:[id number]    cid Fast Smear NOSMR  Final    Comment: (NOTE) The acid-fast bacilli (FB) smear will not be performed due to the specimen source (blood) or submission of an insufficient quantity of specimen.    Source (FB) BLOOD  Final    Comment: Performed at [facility], [place] [address], [place], [place] [address]         Radiology Studies: [place] bdomen Limited Ruq  Result Date: 08/08/2018 CLINICL DT:  Elevated bilirubin. EXM: ULTRSOUND BDOMEN LIMITED RIGHT UPPER QUDRNT COMPRISON:  CT abdomen pelvis dated July 11, 2018. FINDINGS: Gallbladder: Sludge. No gallstones. symmetric wall thickening measuring up to 5 mm. No sonographic Murphy sign noted by sonographer. Common bile duct: Diameter: 3 mm, normal. Liver: No focal lesion identified. Within normal limits in parenchymal echogenicity. Portal vein is patent on color Doppler imaging with normal direction of blood flow towards the liver. Incidental note is made of splenomegaly. IMPRESSION: 1. Gallbladder sludge with asymmetric wall thickening measuring up to 5 mm, nonspecific. cute cholecystitis is unlikely in the absence of gallstones or positive sonographic Murphy sign. 2. Splenomegaly. Electronically Signed   By: Titus Dubin M.D.   On: 08/08/2018 15:27        Scheduled Meds: . B-complex with vitamin C  1 tablet Oral Daily  . bictegravir-emtricitabine-tenofovir F  1 tablet Oral Daily  . Chlorhexidine Gluconate Cloth  6 each Topical Daily  . dextromethorphan-guaiFENesin  2 tablet Oral BID  . feeding supplement (ENSURE ENLIVE)  237 mL Oral TID BM  . folic acid  2 mg Oral Daily  . insulin aspart  0-9 Units Subcutaneous TID WC  . lidocaine  2 patch Transdermal Q24H  . mouth rinse  15 mL Mouth Rinse BID  .  methylPREDNISolone (SOLU-MEDROL) injection  40 mg Intravenous Q12H  . metoprolol tartrate  25 mg Oral BID  . nystatin  5 mL Oral QID  . pantoprazole  40 mg Oral BID  . polyethylene glycol  17 g Oral BID  . primaquine  30 mg Oral Daily  . sodium bicarbonate  1,300 mg Oral TID   Continuous Infusions: . clindamycin (CLEOCIN) IV 900 mg (08/10/18 0542)     LOS: 8 days  Time spent: over 30 min    Fayrene Helper, MD [facility] Pager AMION  If 7PM-7AM, please contact night-coverage [email or web address] Password [facility] 08/10/2018, 12:37 PM

## 2018-08-10 NOTE — Plan of Care (Signed)

## 2018-08-11 ENCOUNTER — Other Ambulatory Visit: Payer: Self-pay

## 2018-08-11 LAB — COMPREHENSIVE METABOLIC PANEL WITH GFR
ALT: 118 U/L — ABNORMAL HIGH (ref 0–44)
AST: 267 U/L — ABNORMAL HIGH (ref 15–41)
Albumin: 1.8 g/dL — ABNORMAL LOW (ref 3.5–5.0)
Alkaline Phosphatase: 415 U/L — ABNORMAL HIGH (ref 38–126)
Anion gap: 8 (ref 5–15)
BUN: 28 mg/dL — ABNORMAL HIGH (ref 6–20)
CO2: 25 mmol/L (ref 22–32)
Calcium: 7.1 mg/dL — ABNORMAL LOW (ref 8.9–10.3)
Chloride: 101 mmol/L (ref 98–111)
Creatinine, Ser: 0.63 mg/dL (ref 0.44–1.00)
GFR calc Af Amer: >60 mL/min (ref >60)
GFR calc non Af Amer: >60 mL/min (ref >60)
Glucose, Bld: 139 mg/dL — ABNORMAL HIGH (ref 70–99)
Potassium: 3.7 mmol/L (ref 3.5–5.1)
Sodium: 134 mmol/L — ABNORMAL LOW (ref 135–145)
Total Bilirubin: 3.2 mg/dL — ABNORMAL HIGH (ref 0.3–1.2)
Total Protein: 6 g/dL — ABNORMAL LOW (ref 6.5–8.1)

## 2018-08-11 LAB — CBC WITH DIFFERENTIAL/PLATELET
Abs Immature Granulocytes: 0.37 10*3/uL — ABNORMAL HIGH (ref 0.00–0.07)
Basophils Absolute: 0 10*3/uL (ref 0.0–0.1)
Basophils Relative: 0 %
Eosinophils Absolute: 0 10*3/uL (ref 0.0–0.5)
Eosinophils Relative: 0 %
HCT: 27.4 % — ABNORMAL LOW (ref 36.0–46.0)
Hemoglobin: 9.1 g/dL — ABNORMAL LOW (ref 12.0–15.0)
Immature Granulocytes: 3 %
Lymphocytes Relative: 13 %
Lymphs Abs: 1.8 10*3/uL (ref 0.7–4.0)
MCH: 29.4 pg (ref 26.0–34.0)
MCHC: 33.2 g/dL (ref 30.0–36.0)
MCV: 88.7 fL (ref 80.0–100.0)
Monocytes Absolute: 0.5 10*3/uL (ref 0.1–1.0)
Monocytes Relative: 3 %
Neutro Abs: 11.5 10*3/uL — ABNORMAL HIGH (ref 1.7–7.7)
Neutrophils Relative %: 81 %
Platelets: 13 10*3/uL — CL (ref 150–400)
RBC: 3.09 MIL/uL — ABNORMAL LOW (ref 3.87–5.11)
RDW: 17.2 % — ABNORMAL HIGH (ref 11.5–15.5)
WBC: 14.3 10*3/uL — ABNORMAL HIGH (ref 4.0–10.5)
nRBC: 1.7 % — ABNORMAL HIGH (ref 0.0–0.2)

## 2018-08-11 LAB — GLUCOSE, CAPILLARY
Glucose-Capillary: 161 mg/dL — ABNORMAL HIGH (ref 70–99)
Glucose-Capillary: 290 mg/dL — ABNORMAL HIGH (ref 70–99)
Glucose-Capillary: 221 mg/dL — ABNORMAL HIGH (ref 70–99)
Glucose-Capillary: 217 mg/dL — ABNORMAL HIGH (ref 70–99)

## 2018-08-11 LAB — BPAM PLATELET PHERESIS
Blood Product Expiration Date: 202007062359
ISSUE DATE / TIME: 202007041753
Unit Type and Rh: 5100

## 2018-08-11 LAB — PREPARE PLATELET PHERESIS: Unit division: 0

## 2018-08-11 LAB — MAGNESIUM: Magnesium: 1.7 mg/dL (ref 1.7–2.4)

## 2018-08-11 MED ORDER — METOPROLOL TARTRATE 50 MG PO TABS
50.0000 mg | ORAL_TABLET | Freq: Two times a day (BID) | ORAL | Status: DC
  Administered 2018-08-11 – 2018-08-21 (×19): 50 mg via ORAL
  Filled 2018-08-11 (×21): qty 1

## 2018-08-11 MED ORDER — ALBUMIN HUMAN 25 % IV SOLN
25.0000 g | Freq: Once | INTRAVENOUS | Status: AC
  Administered 2018-08-11: 25 g via INTRAVENOUS
  Filled 2018-08-11: qty 100

## 2018-08-11 MED ORDER — INSULIN ASPART 100 UNIT/ML ~~LOC~~ SOLN
3.0000 [IU] | Freq: Three times a day (TID) | SUBCUTANEOUS | Status: DC
  Administered 2018-08-11 – 2018-08-12 (×3): 3 [IU] via SUBCUTANEOUS

## 2018-08-11 NOTE — Progress Notes (Signed)
PROGRESS NOTE    Krystal Short  BLT:[id number] DOB: 10/05/1985 DOA: 08/01/2018 PCP: Lajean Manes, MD   Brief Narrative:  Krystal Short is an 33 y.o. female  with Krystal Short past medical history significant for AIDS (recent CD4 count was <35 on 06/20/2018),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,questionablesarcoidosis(currently undergoing pulmonary work-up), ASCUS, and anemia.   Assessment & Plan:   Active Problems:   Hyponatremia   AIDS (acquired immune deficiency syndrome) (HCC)   Molluscum contagiosum   Symptomatic anemia   Thrombocytopenia (HCC)   Pulmonary infiltrates   Fever   Sepsis (HCC)   Acute respiratory failure with hypoxemia (HCC)   Chest pain   AKI (acute kidney injury) ([place])  Acute hypoxic respiratory failure with fever  Bilateral lung infiltrate and sepsis  -concern is for PCP- AQ:[id number] and clindamycin as well as solumedrol - ID c/s, appreciate recs - cefepime has been d/c'd  - autoimmune and vasculitis panel unremarkable (negative GBM ab, mpo/pr-3 ab, Ro/La ab, scl-70 ab, anti CCP, RF, anti DS DNA, ANA - normal ACE and negative histoplasma antigen - fungal antibodies panel pending - per ID (7/2 note), f/u expanded serologies for dimorphic fungal infection.  Follow AFB blood cx. - negative urine strep and legionella  -COVID 19 negative -NP swab: coronavirus (229E) -BC NGTD -Parvo IgG + but IgM negative -Pneumocystis smear pending -bronchoscopy on 6/5, biopsy" SCANT LUNG PARENCHYMA WITH FIBROSIS AND REACTIVE CHANGES. - NO GRANULOMATA IDENTIFIED." -Appreciate infectious disease and pulmonology recommendations - per pulmonology, obtain sputum for PCP, fungus, nocardia, actinomyces.  If no answers, consider VATS biopsy.   Tachycardia  Tachypnea: mild, likely related to above.  Looks like this has been the case for several days.  Sinus on tele.  Continue to monitor.  Pt appears comfortable when at bedside.  EKG with sinus tach Bolus 25 g  albumin Echo from 6/26 with EF 55-60% (see report) Will increase metoprolol to 50 mg BID  Lactic Acidosis: Not sure of clinical significance of this, she appears hemodynamically stable at this time.   ?poor hepatic clearance.  Will continue to monitor clinically.  Elevated LFT's  Elevated Bilirubin  Elevated Alk Phos  Jaundice:  [place] with gallbladder sludge with asymmetric wall thickening measuring up to 5 mm. GI consult, appreciate recs -> recommending HIDA scan (negative) Bili better today, AST/ALT and alk phos worse Follow ASMA, AMA, CMV, HSV  Intermittent back pain High sensitivity troponin unremarkable, CTA chest/ab negative for dissection , no large PE No midline TTP on my exam today, seems mostly paraspinal, continue to monitor  Hyponatremia -stable  AKI -resolved  Elevated Troponin: suspect this may be demand in setting of her tachycardia and acute illness above.  Peaked at 69, now downtrending.  She denied any sx of CP.  Low suspicion for ACS.  Echo 6/26 with normal systolic function, EF 56-25%, no regional wall motion abnormalities (see report). Follow A1c (wnl), lipids (LDL not calculated, total cholesterol 101) Would recommending outpatient cards follow up Started on metop for tachy  Anemia plus thrombocytopenia -S/p prbc transfusionx5 -S/p plt x4 Per heme, suspected multifactorial and likely contribution from severe sepsis with possible secondary HLC/hemophagocytosis  - recommending B complex vitamin and folic acid - transfuse for <7 or active bleeding - transfuse platelets for <10,000 or active bleeding or in the setting of sepsis  - see heme note  Non Anion Gap Metabolic acidosis: Currently on bicarb, increased to 1300 TID Continue to monitor closely, improved Possibly related to HIV and associated illnesses  AIDS CD4=10 06/05/2017 On biktarvy ID following   Hyperglycemia -on steroids -SSI  Sore throat  Concern for thrush: started on nystatin  with concern for thrush  SPEP ordered by PM doc as well as 24 hr urine study  DVT prophylaxis: SCD Code Status: full  Family Communication: none at bedside Disposition Plan: pending further improvement, s/o by pulm and ID  Consultants:   ID  Pulm   Heme onc  Procedures:  Echo IMPRESSIONS    1. The left ventricle has normal systolic function, with an ejection fraction of 55-60%. The cavity size was normal. Indeterminate diastolic filling due to E-Shaheed Schmuck fusion. No evidence of left ventricular regional wall motion abnormalities.  2. The right ventricle has normal systolic function. The cavity was normal. There is no increase in right ventricular wall thickness.  3. Small pericardial effusion.  4. No evidence of mitral valve stenosis. Trivial mitral regurgitation.  5. The aortic valve is tricuspid. No stenosis of the aortic valve.  6. The aortic root is normal in size and structure.  7. The IVC was normal in size. No complete TR doppler jet so unable to estimate PA systolic pressure.  Antimicrobials:  Anti-infectives (From admission, onward)   Start     Dose/Rate Route Frequency Ordered Stop   08/04/18 1000  ceFEPIme (MAXIPIME) 2 g in sodium chloride 0.9 % 100 mL IVPB  Status:  Discontinued     2 g 200 mL/hr over 30 Minutes Intravenous Every 8 hours 08/04/18 0944 08/06/18 1028   08/03/18 0300  vancomycin (VANCOCIN) IVPB 1000 mg/200 mL premix  Status:  Discontinued     1,000 mg 200 mL/hr over 60 Minutes Intravenous Daily 08/02/18 0202 08/02/18 1527   08/02/18 1630  primaquine tablet 30 mg     30 mg Oral Daily 08/02/18 1527     08/02/18 1530  clindamycin (CLEOCIN) IVPB 900 mg     900 mg 100 mL/hr over 30 Minutes Intravenous Every 8 hours 08/02/18 1527     08/02/18 1000  ceFEPIme (MAXIPIME) 2 g in sodium chloride 0.9 % 100 mL IVPB  Status:  Discontinued     2 g 200 mL/hr over 30 Minutes Intravenous Every 12 hours 08/02/18 0202 08/04/18 0944   08/02/18 1000  azithromycin  (ZITHROMAX) tablet 250 mg    Note to Pharmacy: Zpack taper as directed     250 mg Oral Daily 08/02/18 0302 08/03/18 1018   08/02/18 1000  bictegravir-emtricitabine-tenofovir AF (BIKTARVY) 50-200-25 MG per tablet 1 tablet     1 tablet Oral Daily 08/02/18 0302     08/01/18 2300  vancomycin (VANCOCIN) 1,250 mg in sodium chloride 0.9 % 250 mL IVPB     1,250 mg 166.7 mL/hr over 90 Minutes Intravenous  Once 08/01/18 2242 08/02/18 0309   08/01/18 2245  ceFEPIme (MAXIPIME) 2 g in sodium chloride 0.9 % 100 mL IVPB     2 g 200 mL/hr over 30 Minutes Intravenous NOW 08/01/18 2241 08/02/18 0058     Subjective: Feels ok today.  C/o sore throat  Objective: Vitals:   08/10/18 1549 08/10/18 1742 08/10/18 1814 08/10/18 2003  BP: 119/88 119/82 111/83 119/76  Pulse: (!) 115 (!) 116 (!) 116 (!) 113  Resp:  16 16 (!) 24  Temp: 98.7 F (37.1 C) 99.6 F (37.6 C) 100 F (37.8 C) 98.4 F (36.9 C)  TempSrc: Oral Oral Oral Oral  SpO2: 99% 96% 98% 93%  Weight:      Height:  Intake/Output Summary (Last 24 hours) at 08/11/2018 1206 Last data filed at 08/10/2018 2200 Gross per 24 hour  Intake 471.83 ml  Output -  Net 471.83 ml   Filed Weights   08/01/18 2226 08/02/18 0103  Weight: 56.7 kg 56.7 kg    Examination:  General: No acute distress. Cardiovascular: Heart sounds show Dyamond Tolosa tachy rate Lungs: Clear to auscultation bilaterally Abdomen: Soft, nontender, mildly distended Neurological: Alert and oriented 3. Moves all extremities 4. Cranial nerves II through XII grossly intact. Skin: Warm and dry. No rashes or lesions. Extremities: No clubbing or cyanosis. Bilateral LE edema Psychiatric: Mood and affect are normal. Insight and judgment are appropriate.   Data Reviewed: I have personally reviewed following labs and imaging studies  CBC: Recent Labs  Lab 08/06/18 0246 08/06/18 1848 08/07/18 0254  08/09/18 0228 08/09/18 1612 08/09/18 1655 08/10/18 0522 08/10/18 1339 08/11/18 0525   WBC 13.3* 12.5* 14.2*   < > 18.2*  --  16.6* 16.1* 19.4* 14.3*  NEUTROABS 12.1* 11.5* 12.9*  --  14.6*  --   --   --   --  11.5*  HGB 7.5* 9.6* 9.4*   < > 6.3* 10.1* 10.1* 10.0* 10.2* 9.1*  HCT 24.2* 29.6* 28.6*   < > 20.1* 31.2* 30.3* 30.1* 29.9* 27.4*  MCV 96.8 93.1 91.1   < > 93.1  --  89.6 88.0 88.2 88.7  PLT 12* 20* 36*   < > 13*  --  8* 7* 9* 13*   < > = values in this interval not displayed.   Basic Metabolic Panel: Recent Labs  Lab 08/07/18 0254 08/08/18 0240 08/09/18 0228 08/10/18 0522 08/11/18 0525  NA 132* 135 135 135 134*  K 4.9 3.8 4.0 3.6 3.7  CL 108 107 105 103 101  CO2 15* 16* 20* 24 25  GLUCOSE 198* 154* 168* 174* 139*  BUN 29* 28* 26* 26* 28*  CREATININE 0.72 0.78 0.70 0.60 0.63  CALCIUM 7.6* 7.8* 7.9* 7.6* 7.1*  MG  --  1.3* 1.7 1.5* 1.7   GFR: Estimated Creatinine Clearance: 76.9 mL/min (by C-G formula based on SCr of 0.63 mg/dL). Liver Function Tests: Recent Labs  Lab 08/08/18 0240 08/09/18 0228 08/09/18 1126 08/10/18 0522 08/11/18 0525  AST 36 171*  --  100* 267*  ALT 20 51*  --  69* 118*  ALKPHOS 155* 301*  --  291* 415*  BILITOT 3.0* 5.2* 5.4* 5.0* 3.2*  PROT 6.1* 6.4*  --  6.5 6.0*  ALBUMIN 1.4* 2.0*  --  1.9* 1.8*   No results for input(s): LIPASE, AMYLASE in the last 168 hours. No results for input(s): AMMONIA in the last 168 hours. Coagulation Profile: Recent Labs  Lab 08/09/18 1126 08/10/18 0522  INR 1.4* 1.4*   Cardiac Enzymes: No results for input(s): CKTOTAL, CKMB, CKMBINDEX, TROPONINI in the last 168 hours. BNP (last 3 results) No results for input(s): PROBNP in the last 8760 hours. HbA1C: Recent Labs    08/09/18 0228  HGBA1C 5.5   CBG: Recent Labs  Lab 08/10/18 1145 08/10/18 1644 08/10/18 2248 08/11/18 0745 08/11/18 1137  GLUCAP 225* 179* 254* 161* 290*   Lipid Profile: Recent Labs    08/09/18 0228  CHOL 101  HDL <10*  LDLCALC NOT CALCULATED  TRIG 221*  CHOLHDL NOT CALCULATED   Thyroid Function Tests:  No results for input(s): TSH, T4TOTAL, FREET4, T3FREE, THYROIDAB in the last 72 hours. Anemia Panel: No results for input(s): VITAMINB12, FOLATE, FERRITIN, TIBC, IRON, RETICCTPCT  in the last 72 hours. Sepsis Labs: Recent Labs  Lab 08/05/18 1050 08/06/18 0246  08/07/18 0254 08/07/18 0815 08/07/18 1053 08/07/18 1545 08/08/18 0240  PROCALCITON 28.65 28.16  --  26.11  --   --   --   --   LATICACIDVEN  --  3.5*   < >  --  1.7 2.9* 3.2* 3.1*   < > = values in this interval not displayed.    Recent Results (from the past 240 hour(s))  Blood Culture (routine x 2)     Status: None   Collection Time: 08/01/18 10:27 PM   Specimen: BLOOD  Result Value Ref Range Status   Specimen Description   Final    BLOOD SITE NOT SPECIFIED Performed at [facility], [address] [place], [place] [address]    Special Requests   Final    BOTTLES DRAWN AEROBIC AND ANAEROBIC Blood Culture adequate volume Performed at [facility] [address], [place], [place] [address]    Culture   Final    NO GROWTH 5 DAYS Performed at [facility], [place] [address], [place], [place] [address]    Report Status 08/07/2018 FINAL  Final  Fungus culture, blood     Status: None   Collection Time: 08/01/18 10:27 PM   Specimen: BLOOD  Result Value Ref Range Status   Specimen Description   Final    BLOOD SITE NOT SPECIFIED Performed at [facility] [address] [place], [place] [address]    Special Requests   Final    BOTTLES DRAWN AEROBIC AND ANAEROBIC Blood Culture adequate volume Performed at [facility] [address] [place],  [address]    Culture   Final    NO GROWTH 7 DAYS NO FUNGUS ISOLATED Performed at [facility], [place] [address] [place],  [address]    Report Status 08/09/2018 FINAL  Final  SARS Coronavirus 2 (CEPHEID- Performed in [facility] lab), Hosp Order     Status: None   Collection Time:  08/01/18 10:28 PM   Specimen: Nasopharyngeal Swab  Result Value Ref Range Status   SARS Coronavirus 2 NEGATIVE NEGATIVE Final    Comment: (NOTE) If result is NEGATIVE SARS-CoV-2 target nucleic acids are NOT DETECTED. The SARS-CoV-2 RNA is generally detectable in upper and lower  respiratory specimens during the acute phase of infection. The lowest  concentration of SARS-CoV-2 viral copies this assay can detect is 250  copies / mL. Leonna Schlee negative result does not preclude SARS-CoV-2 infection  and should not be used as the sole basis for treatment or other  patient management decisions.  Mauri Temkin negative result may occur with  improper specimen collection / handling, submission of specimen other  than nasopharyngeal swab, presence of viral mutation(s) within the  areas targeted by this assay, and inadequate number of viral copies  (<250 copies / mL). Norabelle Kondo negative result must be combined with clinical  observations, patient history, and epidemiological information. If result is POSITIVE SARS-CoV-2 target nucleic acids are DETECTED. The SARS-CoV-2 RNA is generally detectable in upper and lower  respiratory specimens dur ing the acute phase of infection.  Positive  results are indicative of active infection with SARS-CoV-2.  Clinical  correlation with patient history and other diagnostic information is  necessary to determine patient infection status.  Positive results do  not rule out bacterial infection or co-infection with other viruses. If result is PRESUMPTIVE POSTIVE SARS-CoV-2  nucleic acids MAY BE PRESENT.   Kerryann Allaire presumptive positive result was obtained on the submitted specimen  and confirmed on repeat testing.  While 2019 novel coronavirus  (SARS-CoV-2) nucleic acids may be present in the submitted sample  additional confirmatory testing may be necessary for epidemiological  and / or clinical management purposes  to differentiate between  SARS-CoV-2 and other Sarbecovirus currently known to  infect humans.  If clinically indicated additional testing with an alternate test  methodology [phone]) is advised. The SARS-CoV-2 RNA is generally  detectable in upper and lower respiratory sp ecimens during the acute  phase of infection. The expected result is Negative. Fact Sheet for Patients:  StrictlyIdeas.no Fact Sheet for Healthcare Providers: BankingDealers.co.za This test is not yet approved or cleared by the [place] FDA and has been authorized for detection and/or diagnosis of SARS-CoV-2 by FDA under an Emergency Use Authorization (EUA).  This EUA will remain in effect (meaning this test can be used) for the duration of the COVID-19 declaration under Section 564(b)(1) of the Act, 21 U.S.C. section 360bbb-3(b)(1), unless the authorization is terminated or revoked sooner. Performed at [facility], [place] [address], [place], [place] [address]   MRSA PCR Screening     Status: None   Collection Time: 08/02/18  3:11 AM   Specimen: Nasal Mucosa; Nasopharyngeal  Result Value Ref Range Status   MRSA by PCR NEGATIVE NEGATIVE Final    Comment:        The GeneXpert MRSA Assay (FDA approved for NASAL specimens only), is one component of Vernon Maish comprehensive MRSA colonization surveillance program. It is not intended to diagnose MRSA infection nor to guide or monitor treatment for MRSA infections. Performed at [facility], [place] [address] [place], [place] [address]   Blood Culture (routine x 2)     Status: None   Collection Time: 08/02/18 11:24 AM   Specimen: BLOOD LEFT ARM  Result Value Ref Range Status   Specimen Description   Final    BLOOD LEFT ARM Performed at [facility], [address], [place], [place] [address]    Special Requests   Final    BOTTLES DRAWN AEROBIC AND ANAEROBIC Blood Culture adequate volume Performed at [facility] [address],  [place], [place] [address]    Culture   Final    NO GROWTH 5 DAYS Performed at [facility], [place] [address] [place], [place] [address]    Report Status 08/07/2018 FINAL  Final  Bordetella pertussis PCR     Status: None   Collection Time: 08/02/18 12:19 PM   Specimen: Nasopharyngeal  Result Value Ref Range Status   B pertussis, DNA Negative Negative Final   B parapertussis, DNA Negative Negative Final    Comment: (NOTE) This test was developed and its performance characteristics determined by [employer]. It has not been cleared or approved by the U.S. Food and Drug Administration. The FDA has determined that such clearance or approval is not necessary. This test is used for clinical purposes. It should not be regarded as investigational or research. Performed At: [facility] [place], [place] [id number] Rush Farmer MD [id number]   Respiratory Panel by PCR     Status: Abnormal   Collection Time: 08/02/18 12:19 PM   Specimen: Nasopharyngeal Swab; Respiratory  Result Value Ref Range Status   Adenovirus NOT DETECTED NOT DETECTED Final   Coronavirus 229E DETECTED (Kamyiah Colantonio) NOT DETECTED Final  Comment: (NOTE) The Coronavirus on the Respiratory Panel, DOES NOT test for the novel  Coronavirus (2019 nCoV)    Coronavirus HKU1 NOT DETECTED NOT DETECTED Final   Coronavirus NL63 NOT DETECTED NOT DETECTED Final   Coronavirus OC43 NOT DETECTED NOT DETECTED Final   Metapneumovirus NOT DETECTED NOT DETECTED Final   Rhinovirus / Enterovirus NOT DETECTED NOT DETECTED Final   Influenza Almadelia Looman NOT DETECTED NOT DETECTED Final   Influenza B NOT DETECTED NOT DETECTED Final   Parainfluenza Virus 1 NOT DETECTED NOT DETECTED Final   Parainfluenza Virus 2 NOT DETECTED NOT DETECTED Final   Parainfluenza Virus 3 NOT DETECTED NOT DETECTED Final   Parainfluenza Virus 4 NOT DETECTED NOT DETECTED Final   Respiratory Syncytial Virus NOT DETECTED NOT DETECTED Final    Bordetella pertussis NOT DETECTED NOT DETECTED Final   Chlamydophila pneumoniae NOT DETECTED NOT DETECTED Final   Mycoplasma pneumoniae NOT DETECTED NOT DETECTED Final    Comment: Performed at [facility], [place] [address], [place], [place] [address]  Acid Fast Smear (AFB)     Status: None   Collection Time: 08/07/18  8:35 PM   Specimen: Vein; Blood  Result Value Ref Range Status   AFB Specimen Processing Concentration  Final    Comment: (NOTE) Performed At: [facility] [place], [place] [id number] Rush Farmer MD [id number]    Acid Fast Smear NOSMR  Final    Comment: (NOTE) The acid-fast bacilli (AFB) smear will not be performed due to the specimen source (blood) or submission of an insufficient quantity of specimen.    Source (AFB) BLOOD  Final    Comment: Performed at [facility], [place] [address], [place], [place] [address]  Culture, blood (single)     Status: None (Preliminary result)   Collection Time: 08/09/18  5:46 PM   Specimen: BLOOD RIGHT HAND  Result Value Ref Range Status   Specimen Description   Final    BLOOD RIGHT HAND Performed at [facility] [address], [place], [place] [address]    Special Requests   Final    BOTTLES DRAWN AEROBIC ONLY Blood Culture results may not be optimal due to an inadequate volume of blood received in culture bottles Performed at [facility] [address] [place], [place] [address]    Culture   Final    NO GROWTH < 24 HOURS Performed at [facility] [address], [place], [place] [address]    Report Status PENDING  Incomplete         Radiology Studies: Nm Hepatobiliary Liver Func  Result Date: 08/10/2018 CLINICAL DATA:  Lower abdominal pain since June 25th.  Nausea. EXAM: NUCLEAR MEDICINE HEPATOBILIARY IMAGING TECHNIQUE: Sequential images of the abdomen were obtained out to 60 minutes following intravenous administration  of radiopharmaceutical. RADIOPHARMACEUTICALS:  7.4 mCi Tc-38m Choletec IV COMPARISON:  Ultrasound of 08/08/2018. FINDINGS: Prompt uptake and biliary excretion of activity by the liver is seen. Gallbladder activity is visualized, consistent with patency of cystic duct. Biliary activity passes into small bowel, consistent with patent common bile duct. IMPRESSION: Normal hepatobiliary study, without evidence of acute cholecystitis. Electronically Signed   By: KAbigail MiyamotoM.D.   On: 08/10/2018 16:01        Scheduled Meds: . B-complex with vitamin C  1 tablet Oral Daily  . bictegravir-emtricitabine-tenofovir AF  1 tablet Oral Daily  . Chlorhexidine Gluconate Cloth  6 each Topical Daily  .  dextromethorphan-guaiFENesin  2 tablet Oral BID  . feeding supplement (ENSURE ENLIVE)  237 mL Oral TID BM  . folic acid  2 mg Oral Daily  . insulin aspart  0-9 Units Subcutaneous TID WC  . lidocaine  2 patch Transdermal Q24H  . mouth rinse  15 mL Mouth Rinse BID  . methylPREDNISolone (SOLU-MEDROL) injection  40 mg Intravenous Q12H  . metoprolol tartrate  50 mg Oral BID  . nystatin  5 mL Oral QID  . pantoprazole  40 mg Oral BID  . polyethylene glycol  17 g Oral BID  . primaquine  30 mg Oral Daily  . sodium bicarbonate  1,300 mg Oral TID   Continuous Infusions: . clindamycin (CLEOCIN) IV 900 mg (08/11/18 0554)     LOS: 9 days    Time spent: over 30 min    Fayrene Helper, MD [facility] Pager AMION  If 7PM-7AM, please contact night-coverage [email or web address] Password [email or web address] 08/11/2018, 12:06 PM

## 2018-08-11 NOTE — Progress Notes (Signed)
[facility] Gastroenterology Progress Note  Krystal Short 33 y.o. 12-27-1985  CC: Abnormal LFTs.   Subjective: No acute GI issues noted.  Swallowing is better.  Denies vomiting.  Denies diarrhea.  Denies bleeding.  ROS : Complaining of generalized weakness.  Complaining of dyspnea with minimal exertion   Objective: Vital signs in last 24 hours: Vitals:   08/10/18 1814 08/10/18 2003  BP: 111/83 119/76  Pulse: (!) 116 (!) 113  Resp: 16 (!) 24  Temp: 100 F (37.8 C) 98.4 F (36.9 C)  SpO2: 98% 93%    Physical Exam:  Head: Normocephalic and atraumatic.  Cardiovascular: Normal rate, regular rhythm and normal heart sounds.  Pulmonary/Chest: Effort normal. . She has rales.  Abdominal: Soft. Bowel sounds are normal. She exhibits no distension. There is no abdominal tenderness. There is no rebound and no guarding.  Musculoskeletal: .     Edema present.  Neurological: She is alert and oriented to person, place, and time.  Psychiatric: She has a normal mood and affect. Judgment and thought content normal.    Lab Results: Recent Labs    08/10/18 0522 08/11/18 0525  NA 135 134*  K 3.6 3.7  CL 103 101  CO2 24 25  GLUCOSE 174* 139*  BUN 26* 28*  CREATININE 0.60 0.63  CALCIUM 7.6* 7.1*  MG 1.5* 1.7   Recent Labs    08/10/18 0522 08/11/18 0525  AST 100* 267*  ALT 69* 118*  ALKPHOS 291* 415*  BILITOT 5.0* 3.2*  PROT 6.5 6.0*  ALBUMIN 1.9* 1.8*   Recent Labs    08/09/18 0228  08/10/18 1339 08/11/18 0525  WBC 18.2*   < > 19.4* 14.3*  NEUTROABS 14.6*  --   --  11.5*  HGB 6.3*   < > 10.2* 9.1*  HCT 20.1*   < > 29.9* 27.4*  MCV 93.1   < > 88.2 88.7  PLT 13*   < > 9* 13*   < > = values in this interval not displayed.   Recent Labs    08/09/18 1126 08/10/18 0522  LABPROT 17.2* 16.7*  INR 1.4* 1.4*      Assessment/Plan: -Abnormal LFTs.  AST 171, ALT 51, alkaline phosphatase 301 and total bilirubin 5.2.  Indirect bilirubin 1.7.  Hepatitis panel was negative in  April 2019.  Ultrasound showed gallbladder sludge and asymmetric wall thickening of gallbladder.  CBD  3 mm.  CT scan on August 02, 2018 showed fatty liver without any pancreatic or CBD ductal dilation.  Normal-appearing pancreas. -Recurrent pulmonary infiltrate.  Followed by pulmonary/critical care as well as ID. -HIV disease.  Followed by infectious disease. -Anemia and severe thrombocytopenia.  Recent work-up negative by hematology.  Recommendations -------------------------- -HIDA scan normal.  Hepatitis panel negative.  -Check HSV and CMV PCR.  Check ASMA and AMA   -Continue nystatin swish and swallow.  -Her elevated LFTs could be from underlying sepsis.  -Monitor LFTs   -GI will follow   Otis Brace MD, [place] 08/11/2018, 9:15 AM  Contact #  [phone]

## 2018-08-12 ENCOUNTER — Inpatient Hospital Stay (HOSPITAL_COMMUNITY): Payer: 59

## 2018-08-12 LAB — COMPREHENSIVE METABOLIC PANEL
ALT: 68 U/L — ABNORMAL HIGH (ref 0–44)
AST: 71 U/L — ABNORMAL HIGH (ref 15–41)
Albumin: 2 g/dL — ABNORMAL LOW (ref 3.5–5.0)
Alkaline Phosphatase: 255 U/L — ABNORMAL HIGH (ref 38–126)
Anion gap: 7 (ref 5–15)
BUN: 22 mg/dL — ABNORMAL HIGH (ref 6–20)
CO2: 26 mmol/L (ref 22–32)
Calcium: 6.9 mg/dL — ABNORMAL LOW (ref 8.9–10.3)
Chloride: 102 mmol/L (ref 98–111)
Creatinine, Ser: 0.49 mg/dL (ref 0.44–1.00)
GFR calc Af Amer: >60 mL/min (ref >60)
GFR calc non Af Amer: >60 mL/min (ref >60)
Glucose, Bld: 157 mg/dL — ABNORMAL HIGH (ref 70–99)
Potassium: 4 mmol/L (ref 3.5–5.1)
Sodium: 135 mmol/L (ref 135–145)
Total Bilirubin: 2.6 mg/dL — ABNORMAL HIGH (ref 0.3–1.2)
Total Protein: 5.5 g/dL — ABNORMAL LOW (ref 6.5–8.1)

## 2018-08-12 LAB — MAGNESIUM: Magnesium: 1.4 mg/dL — ABNORMAL LOW (ref 1.7–2.4)

## 2018-08-12 LAB — GLUCOSE, CAPILLARY
Glucose-Capillary: 254 mg/dL — ABNORMAL HIGH (ref 70–99)
Glucose-Capillary: 210 mg/dL — ABNORMAL HIGH (ref 70–99)
Glucose-Capillary: 153 mg/dL — ABNORMAL HIGH (ref 70–99)
Glucose-Capillary: 175 mg/dL — ABNORMAL HIGH (ref 70–99)

## 2018-08-12 LAB — CBC
HCT: 24.4 % — ABNORMAL LOW (ref 36.0–46.0)
Hemoglobin: 8.1 g/dL — ABNORMAL LOW (ref 12.0–15.0)
MCH: 29.3 pg (ref 26.0–34.0)
MCHC: 33.2 g/dL (ref 30.0–36.0)
MCV: 88.4 fL (ref 80.0–100.0)
Platelets: 9 10*3/uL — CL (ref 150–400)
RBC: 2.76 MIL/uL — ABNORMAL LOW (ref 3.87–5.11)
RDW: 16.8 % — ABNORMAL HIGH (ref 11.5–15.5)
WBC: 12.1 10*3/uL — ABNORMAL HIGH (ref 4.0–10.5)
nRBC: 1.2 % — ABNORMAL HIGH (ref 0.0–0.2)

## 2018-08-12 MED ORDER — INSULIN ASPART 100 UNIT/ML ~~LOC~~ SOLN: 0.0000 [IU] | Freq: Every day | SUBCUTANEOUS | Status: DC

## 2018-08-12 MED ORDER — INSULIN ASPART 100 UNIT/ML ~~LOC~~ SOLN
0.0000 [IU] | Freq: Three times a day (TID) | SUBCUTANEOUS | Status: DC
  Administered 2018-08-12: 3 [IU] via SUBCUTANEOUS
  Administered 2018-08-13: 5 [IU] via SUBCUTANEOUS
  Administered 2018-08-13 (×2): 3 [IU] via SUBCUTANEOUS
  Administered 2018-08-14: 2 [IU] via SUBCUTANEOUS
  Administered 2018-08-14 – 2018-08-15 (×3): 3 [IU] via SUBCUTANEOUS
  Administered 2018-08-16 – 2018-08-17 (×3): 2 [IU] via SUBCUTANEOUS
  Administered 2018-08-18: 3 [IU] via SUBCUTANEOUS
  Administered 2018-08-18: 2 [IU] via SUBCUTANEOUS
  Administered 2018-08-19: 3 [IU] via SUBCUTANEOUS
  Administered 2018-08-20: 5 [IU] via SUBCUTANEOUS
  Administered 2018-08-20: 3 [IU] via SUBCUTANEOUS
  Administered 2018-08-21: 2 [IU] via SUBCUTANEOUS
  Administered 2018-08-21: 8 [IU] via SUBCUTANEOUS
  Administered 2018-08-22 (×2): 5 [IU] via SUBCUTANEOUS

## 2018-08-12 MED ORDER — MAGNESIUM SULFATE 4 GM/100ML IV SOLN
4.0000 g | Freq: Once | INTRAVENOUS | Status: AC
  Administered 2018-08-12: 4 g via INTRAVENOUS
  Filled 2018-08-12: qty 100

## 2018-08-12 MED ORDER — SODIUM CHLORIDE 0.9% IV SOLUTION
Freq: Once | INTRAVENOUS | Status: AC
  Administered 2018-08-12: 09:00:00 via INTRAVENOUS

## 2018-08-12 MED ORDER — INSULIN ASPART 100 UNIT/ML ~~LOC~~ SOLN
5.0000 [IU] | Freq: Three times a day (TID) | SUBCUTANEOUS | Status: DC
  Administered 2018-08-13 – 2018-08-16 (×6): 5 [IU] via SUBCUTANEOUS

## 2018-08-12 NOTE — Progress Notes (Signed)
PROGRESS NOTE    Krystal Short  GYF:[id number] DOB: February 02, 1986 DOA: 08/01/2018 PCP: Lajean Manes, MD   Brief Narrative:  Krystal Short is an 33 y.o. female  with Krystal Short past medical history significant for AIDS (recent CD4 count was <35 on 06/20/2018),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,questionablesarcoidosis(currently undergoing pulmonary work-up), ASCUS, and anemia.   Assessment & Plan:   Active Problems:   Hyponatremia   AIDS (acquired immune deficiency syndrome) (HCC)   Molluscum contagiosum   Symptomatic anemia   Thrombocytopenia (HCC)   Pulmonary infiltrates   Fever   Sepsis (HCC)   Acute respiratory failure with hypoxemia (HCC)   Chest pain   AKI (acute kidney injury) ([place])  Acute hypoxic respiratory failure with fever  Bilateral lung infiltrate and sepsis  -concern is for PCP- FF:[id number] and clindamycin as well as solumedrol - ID c/s, appreciate recs - cefepime has been d/c'd  - autoimmune and vasculitis panel unremarkable (negative GBM ab, mpo/pr-3 ab, Ro/[place] ab, scl-70 ab, anti CCP, RF, anti DS DNA, ANA - normal ACE and negative histoplasma antigen - fungal antibodies panel pending - per ID (7/2 note), f/u expanded serologies for dimorphic fungal infection.  Follow AFB blood cx. - negative urine strep and legionella  -COVID 19 negative -NP swab: coronavirus (229E) -BC NGTD -Parvo IgG + but IgM negative -Pneumocystis smear pending -bronchoscopy on 6/5, biopsy" SCANT LUNG PARENCHYMA WITH FIBROSIS AND REACTIVE CHANGES. - NO GRANULOMATA IDENTIFIED." -Appreciate infectious disease and pulmonology recommendations - per pulmonology, obtain sputum for PCP, fungus, nocardia, actinomyces.  If no answers, consider VATS biopsy.   Tachycardia  Tachypnea: mild, likely related to above.  Looks like this has been the case for several days.  Sinus on tele.  Continue to monitor.  Pt appears comfortable when at bedside.  EKG with sinus tach Bolus 25 g  albumin Echo from 6/26 with EF 55-60% (see report) Will increase metoprolol to 50 mg BID - improved rate today  Lactic Acidosis: Not sure of clinical significance of this, she appears hemodynamically stable at this time.   ?poor hepatic clearance.  Will continue to monitor clinically.  Elevated LFT's  Elevated Bilirubin  Elevated Alk Phos  Jaundice:  [place] with gallbladder sludge with asymmetric wall thickening measuring up to 5 mm. GI consult, appreciate recs -> recommending HIDA scan (negative) Improving today - AST, ALT, alk phos, bili improving Follow ASMA, AMA, CMV, HSV  Intermittent back pain High sensitivity troponin unremarkable, CTA chest/ab negative for dissection , no large PE No midline TTP on my exam today, seems mostly paraspinal, continue to monitor  Hyponatremia -stable  Hypomagnesemia - replace, follow  AKI -resolved  Elevated Troponin: suspect this may be demand in setting of her tachycardia and acute illness above.  Peaked at 55, now downtrending.  She denied any sx of CP.  Low suspicion for ACS.  Echo 6/26 with normal systolic function, EF 57-01%, no regional wall motion abnormalities (see report). Follow A1c (wnl), lipids (LDL not calculated, total cholesterol 101) Would recommending outpatient cards follow up Started on metop for tachy  Anemia plus thrombocytopenia -S/p prbc transfusionx5 -S/p plt x4, will give additional 2 units today Per heme, suspected multifactorial and likely contribution from severe sepsis with possible secondary HLC/hemophagocytosis  - recommending B complex vitamin and folic acid - transfuse for <7 or active bleeding - transfuse platelets for <10,000 or active bleeding or in the setting of sepsis  - see heme note  Non Anion Gap Metabolic acidosis: Currently on bicarb,  increased to 1300 TID Continue to monitor closely, improved Possibly related to HIV and associated illnesses  AIDS CD4=10 06/05/2017 On biktarvy ID  following   Hyperglycemia -on steroids -SSI, start mealtime insulin  Sore throat  Concern for thrush: started on nystatin with concern for thrush  SPEP ordered by PM doc as well as 24 hr urine study  DVT prophylaxis: SCD Code Status: full  Family Communication: none at bedside Disposition Plan: pending further improvement, s/o by pulm and ID  Consultants:   ID  Pulm   Heme onc  Procedures:  Echo IMPRESSIONS    1. The left ventricle has normal systolic function, with an ejection fraction of 55-60%. The cavity size was normal. Indeterminate diastolic filling due to E-Jazia Faraci fusion. No evidence of left ventricular regional wall motion abnormalities.  2. The right ventricle has normal systolic function. The cavity was normal. There is no increase in right ventricular wall thickness.  3. Small pericardial effusion.  4. No evidence of mitral valve stenosis. Trivial mitral regurgitation.  5. The aortic valve is tricuspid. No stenosis of the aortic valve.  6. The aortic root is normal in size and structure.  7. The IVC was normal in size. No complete TR doppler jet so unable to estimate PA systolic pressure.  Antimicrobials:  Anti-infectives (From admission, onward)   Start     Dose/Rate Route Frequency Ordered Stop   08/04/18 1000  ceFEPIme (MAXIPIME) 2 g in sodium chloride 0.9 % 100 mL IVPB  Status:  Discontinued     2 g 200 mL/hr over 30 Minutes Intravenous Every 8 hours 08/04/18 0944 08/06/18 1028   08/03/18 0300  vancomycin (VANCOCIN) IVPB 1000 mg/200 mL premix  Status:  Discontinued     1,000 mg 200 mL/hr over 60 Minutes Intravenous Daily 08/02/18 0202 08/02/18 1527   08/02/18 1630  primaquine tablet 30 mg     30 mg Oral Daily 08/02/18 1527     08/02/18 1530  clindamycin (CLEOCIN) IVPB 900 mg     900 mg 100 mL/hr over 30 Minutes Intravenous Every 8 hours 08/02/18 1527     08/02/18 1000  ceFEPIme (MAXIPIME) 2 g in sodium chloride 0.9 % 100 mL IVPB  Status:  Discontinued      2 g 200 mL/hr over 30 Minutes Intravenous Every 12 hours 08/02/18 0202 08/04/18 0944   08/02/18 1000  azithromycin (ZITHROMAX) tablet 250 mg    Note to Pharmacy: Zpack taper as directed     250 mg Oral Daily 08/02/18 0302 08/03/18 1018   08/02/18 1000  bictegravir-emtricitabine-tenofovir AF (BIKTARVY) 50-200-25 MG per tablet 1 tablet     1 tablet Oral Daily 08/02/18 0302     08/01/18 2300  vancomycin (VANCOCIN) 1,250 mg in sodium chloride 0.9 % 250 mL IVPB     1,250 mg 166.7 mL/hr over 90 Minutes Intravenous  Once 08/01/18 2242 08/02/18 0309   08/01/18 2245  ceFEPIme (MAXIPIME) 2 g in sodium chloride 0.9 % 100 mL IVPB     2 g 200 mL/hr over 30 Minutes Intravenous NOW 08/01/18 2241 08/02/18 0058     Subjective: Feeling better today  Objective: Vitals:   08/12/18 0437 08/12/18 0809 08/12/18 1123 08/12/18 1142  BP: 104/71 114/76 114/87 119/87  Pulse: (!) 125 (!) 122 (!) 101 (!) 101  Resp: 20 (!) 24 20 20   Temp: 98.4 F (36.9 C) 98.1 F (36.7 C) 98.5 F (36.9 C) 98.7 F (37.1 C)  TempSrc: Oral Oral Oral Oral  SpO2:  94% 95% 99% 96%  Weight:      Height:        Intake/Output Summary (Last 24 hours) at 08/12/2018 1204 Last data filed at 08/11/2018 1700 Gross per 24 hour  Intake 240 ml  Output -  Net 240 ml   Filed Weights   08/01/18 2226 08/02/18 0103  Weight: 56.7 kg 56.7 kg    Examination:  General: No acute distress. Cardiovascular: Heart sounds show Krystal Short regular rate, and rhythm.  Lungs: Clear to auscultation bilaterally  Abdomen: Soft, nontender, nondistended  Neurological: Alert and oriented 3. Moves all extremities 4 with equal strength. Cranial nerves II through XII grossly intact. Skin: Warm and dry. No rashes or lesions. Extremities: 1+ bilateral LE edema   Data Reviewed: I have personally reviewed following labs and imaging studies  CBC: Recent Labs  Lab 08/06/18 0246 08/06/18 1848 08/07/18 0254  08/09/18 0228  08/09/18 1655 08/10/18 0522  08/10/18 1339 08/11/18 0525 08/12/18 0551  WBC 13.3* 12.5* 14.2*   < > 18.2*  --  16.6* 16.1* 19.4* 14.3* 12.1*  NEUTROABS 12.1* 11.5* 12.9*  --  14.6*  --   --   --   --  11.5*  --   HGB 7.5* 9.6* 9.4*   < > 6.3*   < > 10.1* 10.0* 10.2* 9.1* 8.1*  HCT 24.2* 29.6* 28.6*   < > 20.1*   < > 30.3* 30.1* 29.9* 27.4* 24.4*  MCV 96.8 93.1 91.1   < > 93.1  --  89.6 88.0 88.2 88.7 88.4  PLT 12* 20* 36*   < > 13*  --  8* 7* 9* 13* 9*   < > = values in this interval not displayed.   Basic Metabolic Panel: Recent Labs  Lab 08/08/18 0240 08/09/18 0228 08/10/18 0522 08/11/18 0525 08/12/18 0551  NA 135 135 135 134* 135  K 3.8 4.0 3.6 3.7 4.0  CL 107 105 103 101 102  CO2 16* 20* 24 25 26   GLUCOSE 154* 168* 174* 139* 157*  BUN 28* 26* 26* 28* 22*  CREATININE 0.78 0.70 0.60 0.63 0.49  CALCIUM 7.8* 7.9* 7.6* 7.1* 6.9*  MG 1.3* 1.7 1.5* 1.7 1.4*   GFR: Estimated Creatinine Clearance: 76.9 mL/min (by C-G formula based on SCr of 0.49 mg/dL). Liver Function Tests: Recent Labs  Lab 08/08/18 0240 08/09/18 0228 08/09/18 1126 08/10/18 0522 08/11/18 0525 08/12/18 0551  AST 36 171*  --  100* 267* 71*  ALT 20 51*  --  69* 118* 68*  ALKPHOS 155* 301*  --  291* 415* 255*  BILITOT 3.0* 5.2* 5.4* 5.0* 3.2* 2.6*  PROT 6.1* 6.4*  --  6.5 6.0* 5.5*  ALBUMIN 1.4* 2.0*  --  1.9* 1.8* 2.0*   No results for input(s): LIPASE, AMYLASE in the last 168 hours. No results for input(s): AMMONIA in the last 168 hours. Coagulation Profile: Recent Labs  Lab 08/09/18 1126 08/10/18 0522  INR 1.4* 1.4*   Cardiac Enzymes: No results for input(s): CKTOTAL, CKMB, CKMBINDEX, TROPONINI in the last 168 hours. BNP (last 3 results) No results for input(s): PROBNP in the last 8760 hours. HbA1C: No results for input(s): HGBA1C in the last 72 hours. CBG: Recent Labs  Lab 08/11/18 1137 08/11/18 1641 08/11/18 2044 08/12/18 0806 08/12/18 1148  GLUCAP 290* 221* 217* 254* 210*   Lipid Profile: No results for  input(s): CHOL, HDL, LDLCALC, TRIG, CHOLHDL, LDLDIRECT in the last 72 hours. Thyroid Function Tests: No results for input(s): TSH, T4TOTAL,  FREET4, T3FREE, THYROIDAB in the last 72 hours. Anemia Panel: No results for input(s): VITAMINB12, FOLATE, FERRITIN, TIBC, IRON, RETICCTPCT in the last 72 hours. Sepsis Labs: Recent Labs  Lab 08/06/18 0246  08/07/18 0254 08/07/18 0815 08/07/18 1053 08/07/18 1545 08/08/18 0240  PROCALCITON 28.16  --  26.11  --   --   --   --   LATICACIDVEN 3.5*   < >  --  1.7 2.9* 3.2* 3.1*   < > = values in this interval not displayed.    Recent Results (from the past 240 hour(s))  Bordetella pertussis PCR     Status: None   Collection Time: 08/02/18 12:19 PM   Specimen: Nasopharyngeal  Result Value Ref Range Status   B pertussis, DNA Negative Negative Final   B parapertussis, DNA Negative Negative Final    Comment: (NOTE) This test was developed and its performance characteristics determined by [employer]. It has not been cleared or approved by the U.S. Food and Drug Administration. The FDA has determined that such clearance or approval is not necessary. This test is used for clinical purposes. It should not be regarded as investigational or research. Performed At: [facility] [place], [place] [id number] Krystal Farmer MD [id number]   Respiratory Panel by PCR     Status: Abnormal   Collection Time: 08/02/18 12:19 PM   Specimen: Nasopharyngeal Swab; Respiratory  Result Value Ref Range Status   Adenovirus NOT DETECTED NOT DETECTED Final   Coronavirus 229E DETECTED (Krystal Short) NOT DETECTED Final    Comment: (NOTE) The Coronavirus on the Respiratory Panel, DOES NOT test for the novel  Coronavirus (2019 nCoV)    Coronavirus HKU1 NOT DETECTED NOT DETECTED Final   Coronavirus NL63 NOT DETECTED NOT DETECTED Final   Coronavirus OC43 NOT DETECTED NOT DETECTED Final   Metapneumovirus NOT DETECTED NOT DETECTED Final    Rhinovirus / Enterovirus NOT DETECTED NOT DETECTED Final   Influenza Krystal Short NOT DETECTED NOT DETECTED Final   Influenza B NOT DETECTED NOT DETECTED Final   Parainfluenza Virus 1 NOT DETECTED NOT DETECTED Final   Parainfluenza Virus 2 NOT DETECTED NOT DETECTED Final   Parainfluenza Virus 3 NOT DETECTED NOT DETECTED Final   Parainfluenza Virus 4 NOT DETECTED NOT DETECTED Final   Respiratory Syncytial Virus NOT DETECTED NOT DETECTED Final   Bordetella pertussis NOT DETECTED NOT DETECTED Final   Chlamydophila pneumoniae NOT DETECTED NOT DETECTED Final   Mycoplasma pneumoniae NOT DETECTED NOT DETECTED Final    Comment: Performed at [facility], [address], [place], [place] [address]  Acid Fast Smear (AFB)     Status: None   Collection Time: 08/07/18  8:35 PM   Specimen: Vein; Blood  Result Value Ref Range Status   AFB Specimen Processing Concentration  Final    Comment: (NOTE) Performed At: [facility] [place], [place] [id number] Krystal Farmer MD [id number]    Acid Fast Smear NOSMR  Final    Comment: (NOTE) The acid-fast bacilli (AFB) smear will not be performed due to the specimen source (blood) or submission of an insufficient quantity of specimen.    Source (AFB) BLOOD  Final    Comment: Performed at [facility], [place] [address], [place], [place] [address]  Culture, blood (single)     Status: None (Preliminary result)   Collection Time: 08/09/18  5:46 PM   Specimen: BLOOD RIGHT HAND  Result Value Ref Range Status   Specimen Description  Final    BLOOD RIGHT HAND Performed at [facility], [place] [address], [place], [place] [address]    Special Requests   Final    BOTTLES DRAWN AEROBIC ONLY Blood Culture results may not be optimal due to an inadequate volume of blood received in culture bottles Performed at [place] [address] [place], [place] [address]    Culture   Final    NO  GROWTH 2 DAYS Performed at [place] [address] [place], [place] [address]    Report Status PENDING  Incomplete         Radiology Studies: Nm Hepatobiliary Liver Func  Result Date: 08/10/2018 CLINICAL DATA:  Lower abdominal pain since June 25th.  Nausea. EXAM: NUCLEAR MEDICINE HEPATOBILIARY IMAGING TECHNIQUE: Sequential images of the abdomen were obtained out to 60 minutes following intravenous administration of radiopharmaceutical. RADIOPHARMACEUTICALS:  7.4 mCi Tc-36m Choletec IV COMPARISON:  Ultrasound of 08/08/2018. FINDINGS: Prompt uptake and biliary excretion of activity by the liver is seen. Gallbladder activity is visualized, consistent with patency of cystic duct. Biliary activity passes into small bowel, consistent with patent common bile duct. IMPRESSION: Normal hepatobiliary study, without evidence of acute cholecystitis. Electronically Signed   By: KAbigail MiyamotoM.D.   On: 08/10/2018 16:01        Scheduled Meds: . B-complex with vitamin C  1 tablet Oral Daily  . bictegravir-emtricitabine-tenofovir AF  1 tablet Oral Daily  . dextromethorphan-guaiFENesin  2 tablet Oral BID  . feeding supplement (ENSURE ENLIVE)  237 mL Oral TID BM  . folic acid  2 mg Oral Daily  . insulin aspart  0-9 Units Subcutaneous TID WC  . insulin aspart  3 Units Subcutaneous TID WC  . lidocaine  2 patch Transdermal Q24H  . mouth rinse  15 mL Mouth Rinse BID  . methylPREDNISolone (SOLU-MEDROL) injection  40 mg Intravenous Q12H  . metoprolol tartrate  50 mg Oral BID  . nystatin  5 mL Oral QID  . pantoprazole  40 mg Oral BID  . polyethylene glycol  17 g Oral BID  . primaquine  30 mg Oral Daily  . sodium bicarbonate  1,300 mg Oral TID   Continuous Infusions: . clindamycin (CLEOCIN) IV 900 mg (08/12/18 0558)  . magnesium sulfate bolus IVPB       LOS: 10 days    Time spent: over 30 min    CFayrene Helper MD [facility] Pager AMION  If 7PM-7AM, please contact  night-coverage [email or web address] Password [email or web address] 08/12/2018, 12:04 PM

## 2018-08-12 NOTE — Progress Notes (Signed)
PULMONARY / CRITICAL CARE MEDICINE   NAME:  Krystal Short, MRN:  [id number], DOB:  Aug 17, 1985, LOS: 87 ADMISSION DATE:  08/01/2018, CONSULTATION DATE:  08/02/2018 REFERRING MD:  [facility], CHIEF COMPLAINT:  Sob/cough  BRIEF HISTORY:    33 yo female former smoke with hx of HIV developed pulmonary infiltrates 03/08/18.  This persisted.  She then developed fever and was admitted from 07/04/18 to 07/16/18.  She had EBUS 07/12/18 that was negative for malignancy or granulomas.  She did grow Bordetella bronchiseptica in culture and was treated for this as well as presumptive diagnosis of sarcoidosis with steroids.  She had improvement on imaging studies and clinically, and weaned off supplemental oxygen.  She travelled to [place] 07/20/18 to 07/21/18 and on return developed fever 103F associated with barking cough.  Failed to improved with outpt antibiotics and was admitted to hospital again on 08/01/18.  RVP positive for coronavirus 229E.  PMH:  Hyponatremia, Anemia, PNA, PCP, Thrush - mouth, esophagus, UTI, Molluscum -face, seen at [facility] s/p biopsy, Epidermodysplasia verruciformis  SIGNIFICANT EVENTS:  6/25 Admit with SOB, new infiltrates, dry cough 6/28 Much better cough, min white mucus / still sob with activity but now sats ok at rest RA.  6/29 ESR elevated but improved, O2 3L.  STUDIES:   CTA Chest 6/26 >> neg PE, diffuse GGO, trace pleural effusions, scattered adenopathy, splenomegaly, hepatic statosis Serology 6/29 >> negative  CULTURES:  COVID 19  6/25 >> negative Parvovirus B19 AB >> IgG positive, IgM negative Bordetllea PCR  6/26 >> negative Resp PCR 6/26 >> positive for Coronavirus 229E    Pneumocystis 6/26  >> Fungal culture blood 6/25 >>   ANTIBIOTICS per ID :  Vanc 6/25 >> 6/26 x1 dose Bicegravir 6/25 >> Maxepime 6/25 >> 6/30 Zmax  6/26 >> 6/27 Clinda 6/26 >> Primaquine 6/26 >>   LINES/TUBES:     CONSULTANTS:  ID 6/26 pulmonary infiltrates/fever Hematology 6/26  anemia/thrombocytopenia  SUBJECTIVE:  Still has dry cough.  Denies hemoptysis or chest pain.  OBJECTIVE:  CONSTITUTIONAL: BP (!) 122/94   Pulse (!) 103   Temp 98.4 F (36.9 C) (Oral)   Resp 20   Ht 5' 1"  (1.549 m)   Wt 56.7 kg   SpO2 96%   BMI 23.62 kg/m    On ? FIO2 > RA on my exam   I/O last 3 completed shifts: In: 711.8 [P.O.:480; I.V.:9.8; Blood:222] Out: -    EXAM:  General - alert Eyes - pupils reactive ENT - no sinus tenderness, no stridor Cardiac - regular rate/rhythm, no murmur Chest - equal breath sounds b/l, no wheezing or rales Abdomen - soft, non tender, + bowel sounds Extremities - no cyanosis, clubbing, or edema Skin - chronic changes from history of molluscum Neuro - normal strength, moves extremities, follows commands Psych - normal mood and behavior  CXR (reviewed by me) - improved appearance of reticular nodular infiltrates.    RESOLVED PROBLEM LIST    ASSESSMENT AND PLAN    Acute hypoxic respiratory failure recurrent pulmonary infiltrates. Discussion: She was treated for Bordetella and presumptive diagnosis of sarcoidosis in May/June 2020 with improvement in imaging studies and clinically.  She developed recurrent fever, pulmonary infiltrates and hypoxia after travel to [place] and found to be positive for coronavirus 229E.  Other culture results and serology all negative.  She has clinical and radiographic improvement since this admission, and no longer needing supplemental oxygen. Plan - continue solumedrol - ABx per ID - defer bronchoscopy/VATS  since she has improvement with current regimen  Anemia, thrombocytopenia. Plan - f/u CBC - seen by hematology during this admission  Hx of HIV. Discussion: HIV1 RNA < 20 from 07/06/18.  CD4 count 41 from 07/12/18. Plan - per ID  Elevated LFTs. Plan - GI consulted  LABS:   CMP Latest Ref Rng & Units 08/12/2018 08/11/2018 08/10/2018  Glucose 70 - 99 mg/dL 157(H) 139(H) 174(H)  BUN 6 -  20 mg/dL 22(H) 28(H) 26(H)  Creatinine 0.44 - 1.00 mg/dL 0.49 0.63 0.60  Sodium 135 - 145 mmol/L 135 134(L) 135  Potassium 3.5 - 5.1 mmol/L 4.0 3.7 3.6  Chloride 98 - 111 mmol/L 102 101 103  CO2 22 - 32 mmol/L 26 25 24   Calcium 8.9 - 10.3 mg/dL 6.9(L) 7.1(L) 7.6(L)  Total Protein 6.5 - 8.1 g/dL 5.5(L) 6.0(L) 6.5  Total Bilirubin 0.3 - 1.2 mg/dL 2.6(H) 3.2(H) 5.0(H)  Alkaline Phos 38 - 126 U/L 255(H) 415(H) 291(H)  AST 15 - 41 U/L 71(H) 267(H) 100(H)  ALT 0 - 44 U/L 68(H) 118(H) 69(H)   CBC Latest Ref Rng & Units 08/12/2018 08/11/2018 08/10/2018  WBC 4.0 - 10.5 K/uL 12.1(H) 14.3(H) 19.4(H)  Hemoglobin 12.0 - 15.0 g/dL 8.1(L) 9.1(L) 10.2(L)  Hematocrit 36.0 - 46.0 % 24.4(L) 27.4(L) 29.9(L)  Platelets 150 - 400 K/uL 9(LL) 13(LL) 9(LL)   ABG    Component Value Date/Time   HCO3 16.9 (L) 05/28/2018 1651   ACIDBASEDEF 7.1 (H) 05/28/2018 1651   O2SAT 72.7 05/28/2018 1651   CBG (last 3)  Recent Labs    08/11/18 2044 08/12/18 0806 08/12/18 [place], MD [place] 08/12/2018, 2:31 PM

## 2018-08-12 NOTE — Plan of Care (Signed)
  Problem: Education: Goal: Knowledge of General Education information will improve Description: Including pain rating scale, medication(s)/side effects and non-pharmacologic comfort measures Outcome: Progressing   Problem: Health Behavior/Discharge Planning: Goal: Ability to manage health-related needs will improve Outcome: Progressing   Problem: Clinical Measurements: Goal: Diagnostic test results will improve Outcome: Progressing Goal: Respiratory complications will improve Outcome: Progressing Goal: Cardiovascular complication will be avoided Outcome: Progressing   Problem: Nutrition: Goal: Adequate nutrition will be maintained Outcome: Progressing   Problem: Coping: Goal: Level of anxiety will decrease Outcome: Progressing   Problem: Elimination: Goal: Will not experience complications related to bowel motility Outcome: Progressing Goal: Will not experience complications related to urinary retention Outcome: Progressing   Problem: Pain Managment: Goal: General experience of comfort will improve Outcome: Progressing   Problem: Safety: Goal: Ability to remain free from injury will improve Outcome: Progressing   Problem: Skin Integrity: Goal: Risk for impaired skin integrity will decrease Outcome: Progressing

## 2018-08-12 NOTE — Progress Notes (Signed)
[facility] PROGRESS NOTE Subjective Patient states that she feels much better.  Objective: Vital signs in last 24 hours: Temp:  [97.9 F (36.6 C)-98.7 F (37.1 C)] 98.1 F (36.7 C) (07/06 0809) Pulse Rate:  [99-125] 122 (07/06 0809) Resp:  [20-24] 24 (07/06 0809) BP: (104-118)/(71-85) 114/76 (07/06 0809) SpO2:  [94 %-100 %] 95 % (07/06 0809) Last BM Date: 08/10/18  Intake/Output from previous day: 07/05 0701 - 07/06 0700 In: 480 [P.O.:480] Out: -  Intake/Output this shift: No intake/output data recorded.  PE: General--no obvious distress sitting up in bed  Abdomen--nontender  Lab Results: Recent Labs    08/09/18 1655 08/10/18 0522 08/10/18 1339 08/11/18 0525 08/12/18 0551  WBC 16.6* 16.1* 19.4* 14.3* 12.1*  HGB 10.1* 10.0* 10.2* 9.1* 8.1*  HCT 30.3* 30.1* 29.9* 27.4* 24.4*  PLT 8* 7* 9* 13* 9*   BMET Recent Labs    08/10/18 0522 08/11/18 0525 08/12/18 0551  NA 135 134* 135  K 3.6 3.7 4.0  CL 103 101 102  CO2 24 25 26   CREATININE 0.60 0.63 0.49   LFT Recent Labs    08/09/18 1126 08/10/18 0522 08/11/18 0525 08/12/18 0551  PROT  --  6.5 6.0* 5.5*  AST  --  100* 267* 71*  ALT  --  69* 118* 68*  ALKPHOS  --  291* 415* 255*  BILITOT 5.4* 5.0* 3.2* 2.6*  BILIDIR 3.7* 2.8*  --   --   IBILI 1.7* 2.2*  --   --    PT/INR Recent Labs    08/09/18 1126 08/10/18 0522  LABPROT 17.2* 16.7*  INR 1.4* 1.4*   PANCREAS No results for input(s): LIPASE in the last 72 hours.       Studies/Results: Nm Hepatobiliary Liver Func  Result Date: 08/10/2018 CLINICAL DATA:  Lower abdominal pain since June 25th.  Nausea. EXAM: NUCLEAR MEDICINE HEPATOBILIARY IMAGING TECHNIQUE: Sequential images of the abdomen were obtained out to 60 minutes following intravenous administration of radiopharmaceutical. RADIOPHARMACEUTICALS:  7.4 mCi Tc-17m  Choletec IV COMPARISON:  Ultrasound of 08/08/2018. FINDINGS: Prompt uptake and biliary excretion of activity by the  liver is seen. Gallbladder activity is visualized, consistent with patency of cystic duct. Biliary activity passes into small bowel, consistent with patent common bile duct. IMPRESSION: Normal hepatobiliary study, without evidence of acute cholecystitis. Electronically Signed   By: Abigail Miyamoto M.D.   On: 08/10/2018 16:01    Medications: I have reviewed the patient's current medications.  Assessment:   1.  Abnormal LFTs.  Continue to improve.  Has some sludge in the gallbladder but CT of abdomen, ultrasound, HIDA scan unimpressive otherwise.  Hepatitis panel negative CMV, herpes etc. still pending.  Suspect elevated LFTs were secondary to acute pulmonary infection due to HIV.  Is being followed by pulmonary as well as ID.   Plan: We will check on the results of the labs ordered by Dr. Alessandra Bevels. We will continue to treat her underlying infection with no other specific recommendations regarding her liver at this time.   Nancy Fetter 08/12/2018, 9:03 AM  This note was created using voice recognition software. Minor errors may Have occurred unintentionally.  Pager: [phone] If no answer or after hours call [phone]

## 2018-08-12 NOTE — Plan of Care (Signed)

## 2018-08-13 ENCOUNTER — Encounter (HOSPITAL_COMMUNITY): Payer: Self-pay | Admitting: Anesthesiology

## 2018-08-13 ENCOUNTER — Encounter: Payer: 59 | Admitting: Family

## 2018-08-13 DIAGNOSIS — D640 Hereditary sideroblastic anemia: Secondary | ICD-10-CM

## 2018-08-13 LAB — PREPARE PLATELET PHERESIS
Unit division: 0
Unit division: 0

## 2018-08-13 LAB — PROTEIN ELECTROPHORESIS, SERUM
A/G Ratio: 0.4 — ABNORMAL LOW (ref 0.7–1.7)
Albumin ELP: 1.5 g/dL — ABNORMAL LOW (ref 2.9–4.4)
Alpha-1-Globulin: 0.6 g/dL — ABNORMAL HIGH (ref 0.0–0.4)
Alpha-2-Globulin: 1 g/dL (ref 0.4–1.0)
Beta Globulin: 0.9 g/dL (ref 0.7–1.3)
Gamma Globulin: 1.5 g/dL (ref 0.4–1.8)
Globulin, Total: 4 g/dL — ABNORMAL HIGH (ref 2.2–3.9)
Total Protein ELP: 5.5 g/dL — ABNORMAL LOW (ref 6.0–8.5)

## 2018-08-13 LAB — COMPREHENSIVE METABOLIC PANEL
ALT: 52 U/L — ABNORMAL HIGH (ref 0–44)
AST: 54 U/L — ABNORMAL HIGH (ref 15–41)
Albumin: 2.2 g/dL — ABNORMAL LOW (ref 3.5–5.0)
Alkaline Phosphatase: 210 U/L — ABNORMAL HIGH (ref 38–126)
Anion gap: 8 (ref 5–15)
BUN: 17 mg/dL (ref 6–20)
CO2: 25 mmol/L (ref 22–32)
Calcium: 7.2 mg/dL — ABNORMAL LOW (ref 8.9–10.3)
Chloride: 102 mmol/L (ref 98–111)
Creatinine, Ser: 0.52 mg/dL (ref 0.44–1.00)
GFR calc Af Amer: >60 mL/min (ref >60)
GFR calc non Af Amer: >60 mL/min (ref >60)
Glucose, Bld: 128 mg/dL — ABNORMAL HIGH (ref 70–99)
Potassium: 4.1 mmol/L (ref 3.5–5.1)
Sodium: 135 mmol/L (ref 135–145)
Total Bilirubin: 2.3 mg/dL — ABNORMAL HIGH (ref 0.3–1.2)
Total Protein: 6.3 g/dL — ABNORMAL LOW (ref 6.5–8.1)

## 2018-08-13 LAB — GLUCOSE, CAPILLARY
Glucose-Capillary: 199 mg/dL — ABNORMAL HIGH (ref 70–99)
Glucose-Capillary: 234 mg/dL — ABNORMAL HIGH (ref 70–99)
Glucose-Capillary: 176 mg/dL — ABNORMAL HIGH (ref 70–99)
Glucose-Capillary: 195 mg/dL — ABNORMAL HIGH (ref 70–99)

## 2018-08-13 LAB — HEMOGLOBIN AND HEMATOCRIT, BLOOD
HCT: 22.9 % — ABNORMAL LOW (ref 36.0–46.0)
Hemoglobin: 7.2 g/dL — ABNORMAL LOW (ref 12.0–15.0)

## 2018-08-13 LAB — BPAM PLATELET PHERESIS
Blood Product Expiration Date: 202007082359
Blood Product Expiration Date: 202007082359
ISSUE DATE / TIME: 202007061121
ISSUE DATE / TIME: 202007061746
Unit Type and Rh: 5100
Unit Type and Rh: 6200

## 2018-08-13 LAB — CBC
HCT: 22.8 % — ABNORMAL LOW (ref 36.0–46.0)
Hemoglobin: 7.4 g/dL — ABNORMAL LOW (ref 12.0–15.0)
MCH: 29.8 pg (ref 26.0–34.0)
MCHC: 32.5 g/dL (ref 30.0–36.0)
MCV: 91.9 fL (ref 80.0–100.0)
Platelets: 14 10*3/uL — CL (ref 150–400)
RBC: 2.48 MIL/uL — ABNORMAL LOW (ref 3.87–5.11)
RDW: 16.8 % — ABNORMAL HIGH (ref 11.5–15.5)
WBC: 8.2 10*3/uL (ref 4.0–10.5)
nRBC: 1.3 % — ABNORMAL HIGH (ref 0.0–0.2)

## 2018-08-13 LAB — PROTIME-INR
INR: 1.2 (ref 0.8–1.2)
Prothrombin Time: 15.4 seconds — ABNORMAL HIGH (ref 11.4–15.2)

## 2018-08-13 LAB — MAGNESIUM: Magnesium: 2 mg/dL (ref 1.7–2.4)

## 2018-08-13 LAB — MITOCHONDRIAL ANTIBODIES: Mitochondrial M2 Ab, IgG: <20 Units (ref 0.0–20.0)

## 2018-08-13 LAB — IMMATURE PLATELET FRACTION: Immature Platelet Fraction: 20.3 % — ABNORMAL HIGH (ref 1.2–8.6)

## 2018-08-13 LAB — FUNGITELL, SERUM: Fungitell Result: <31 pg/mL (ref <80)

## 2018-08-13 LAB — ANTI-SMOOTH MUSCLE ANTIBODY, IGG: F-Actin IgG: 15 Units (ref 0–19)

## 2018-08-13 MED ORDER — PREDNISONE 5 MG PO TABS
30.0000 mg | ORAL_TABLET | Freq: Every day | ORAL | Status: DC
  Administered 2018-08-13 – 2018-08-22 (×10): 30 mg via ORAL
  Filled 2018-08-13 (×10): qty 1

## 2018-08-13 MED ORDER — SODIUM CHLORIDE 0.9% IV SOLUTION: Freq: Once | INTRAVENOUS | Status: DC

## 2018-08-13 NOTE — Progress Notes (Signed)
PULMONARY / CRITICAL CARE MEDICINE   NAME:  Krystal Short, MRN:  [id number], DOB:  Jun 26, 1985, LOS: 71 ADMISSION DATE:  08/01/2018, CONSULTATION DATE:  08/02/2018 REFERRING MD:  [facility] hospital[facility], CHIEF COMPLAINT:  Sob/cough  BRIEF HISTORY:    33 yo female former smoke with hx of HIV developed pulmonary infiltrates 03/08/18.  This persisted.  She then developed fever and was admitted from 07/04/18 to 07/16/18.  She had EBUS 07/12/18 that was negative for malignancy or granulomas.  She did grow Bordetella bronchiseptica in culture and was treated for this as well as presumptive diagnosis of sarcoidosis with steroids.  She had improvement on imaging studies and clinically, and weaned off supplemental oxygen.  She travelled to [place] 07/20/18 to 07/21/18 and on return developed fever 103F associated with barking cough.  Failed to improved with outpt antibiotics and was admitted to hospital again on 08/01/18.  RVP positive for coronavirus 229E.  PMH:  Hyponatremia, Anemia, PNA, PCP, Thrush - mouth, esophagus, UTI, Molluscum -face, seen at [facility] s/p biopsy, Epidermodysplasia verruciformis  SIGNIFICANT EVENTS:  6/25 Admit with SOB, new infiltrates, dry cough 6/28 Much better cough, min white mucus / still sob with activity but now sats ok at rest RA.  6/29 ESR elevated but improved, O2 3L. 7/04 off supplemental oxygen 7/07 change to prednisone  STUDIES:   CTA Chest 6/26 >> neg PE, diffuse GGO, trace pleural effusions, scattered adenopathy, splenomegaly, hepatic statosis Serology 6/29 >> negative  CULTURES:  COVID 19  6/25 >> negative Parvovirus B19 AB >> IgG positive, IgM negative Bordetllea PCR  6/26 >> negative Resp PCR 6/26 >> positive for Coronavirus 229E     ANTIBIOTICS per ID :  Vanc 6/25 >> 6/26 x1 dose Bicegravir 6/25 >> Maxepime 6/25 >> 6/30 Zmax  6/26 >> 6/27 Clinda 6/26 >> Primaquine 6/26 >>   LINES/TUBES:     CONSULTANTS:  ID 6/26 pulmonary infiltrates/fever Hematology  6/26 anemia/thrombocytopenia  SUBJECTIVE:  Feels better.  Has mild, dry cough.  Not having hemoptysis.  OBJECTIVE:  CONSTITUTIONAL: BP 117/81 (BP Location: Left Arm)   Pulse (!) 106   Temp 99 F (37.2 C) (Oral)   Resp 18   Ht 5' 1"  (1.549 m)   Wt 56.7 kg   SpO2 96%   BMI 23.62 kg/m    On ? FIO2 > RA on my exam   I/O last 3 completed shifts: In: 634 [I.V.:41; Blood:493; IV Piggyback:100] Out: -    EXAM:  General - alert Eyes - pupils reactive ENT - no sinus tenderness, no stridor Cardiac - regular rate/rhythm, no murmur Chest - equal breath sounds b/l, no wheezing or rales Abdomen - soft, non tender, + bowel sounds Extremities - no cyanosis, clubbing, or edema Skin - chronic changes of molluscum Neuro - normal strength, moves extremities, follows commands   RESOLVED PROBLEM LIST    ASSESSMENT AND PLAN    Acute hypoxic respiratory failure recurrent pulmonary infiltrates. Discussion: She was treated for Bordetella and presumptive diagnosis of sarcoidosis in May/June 2020 with improvement in imaging studies and clinically.  She developed recurrent fever, pulmonary infiltrates and hypoxia after travel to [place] and found to be positive for coronavirus 229E.  Other culture results and serology all negative.  She has clinical and radiographic improvement since this admission, and no longer needing supplemental oxygen. Plan - change to prednisone on 7/06 - ABx per ID - defer bronchoscopy/VATS since she has improvement with current regimen  Anemia, thrombocytopenia. Plan - f/u CBC -  seen by hematology during this admission  Hx of HIV. Discussion: HIV1 RNA < 20 from 07/06/18.  CD4 count 41 from 07/12/18. Plan - per ID  Elevated LFTs. Plan - improving >> f/u LFTs intermittently  LABS:   CMP Latest Ref Rng & Units 08/13/2018 08/12/2018 08/11/2018  Glucose 70 - 99 mg/dL 128(H) 157(H) 139(H)  BUN 6 - 20 mg/dL 17 22(H) 28(H)  Creatinine 0.44 - 1.00 mg/dL 0.52 0.49  0.63  Sodium 135 - 145 mmol/L 135 135 134(L)  Potassium 3.5 - 5.1 mmol/L 4.1 4.0 3.7  Chloride 98 - 111 mmol/L 102 102 101  CO2 22 - 32 mmol/L 25 26 25   Calcium 8.9 - 10.3 mg/dL 7.2(L) 6.9(L) 7.1(L)  Total Protein 6.5 - 8.1 g/dL 6.3(L) 5.5(L) 6.0(L)  Total Bilirubin 0.3 - 1.2 mg/dL 2.3(H) 2.6(H) 3.2(H)  Alkaline Phos 38 - 126 U/L 210(H) 255(H) 415(H)  AST 15 - 41 U/L 54(H) 71(H) 267(H)  ALT 0 - 44 U/L 52(H) 68(H) 118(H)   CBC Latest Ref Rng & Units 08/13/2018 08/12/2018 08/11/2018  WBC 4.0 - 10.5 K/uL 8.2 12.1(H) 14.3(H)  Hemoglobin 12.0 - 15.0 g/dL 7.4(L) 8.1(L) 9.1(L)  Hematocrit 36.0 - 46.0 % 22.8(L) 24.4(L) 27.4(L)  Platelets 150 - 400 K/uL 14(LL) 9(LL) 13(LL)   ABG    Component Value Date/Time   HCO3 16.9 (L) 05/28/2018 1651   ACIDBASEDEF 7.1 (H) 05/28/2018 1651   O2SAT 72.7 05/28/2018 1651   CBG (last 3)  Recent Labs    08/12/18 1557 08/12/18 2020 08/13/18 0725  [place], MD [place] 08/13/2018, 10:12 AM

## 2018-08-13 NOTE — Progress Notes (Addendum)
HEMATOLOGY-ONCOLOGY PROGRESS NOTE  SUBJECTIVE: Breathing and cough are better. No bleeding today. Continues to require intermittent PRBC and platelet transfusions.  She last received 2 units of packed red blood cells on 08/09/2018 and has received a total of 5 units of packed red blood cells this admission.  She received 2 units of platelets on 08/12/2018 and has received a total of 6 units of platelets this admission.  I was asked to see the patient today due to ongoing anemia and thrombocytopenia.  REVIEW OF SYSTEMS:   Constitutional: Denies fevers and chills.  Fatigue is improving. Eyes: Denies blurriness of vision Ears, nose, mouth, throat, and face: Denies mucositis or sore throat Respiratory: Cough and shortness of breath are improving.  No longer wearing oxygen. Cardiovascular: Denies palpitation, chest discomfort Gastrointestinal:  Denies nausea, heartburn or change in bowel habits Skin: Denies abnormal skin rashes Lymphatics: Denies new lymphadenopathy or easy bruising Neurological:Denies numbness, tingling or new weaknesses Behavioral/Psych: Mood is stable, no new changes  Extremities: No lower extremity edema All other systems were reviewed with the patient and are negative.  I have reviewed the past medical history, past surgical history, social history and family history with the patient and they are unchanged from previous note.   PHYSICAL EXAMINATION:  Vitals:   08/13/18 0510 08/13/18 1322  BP: 117/81 111/85  Pulse: (!) 106 88  Resp: 18 20  Temp: 99 F (37.2 C) 98.3 F (36.8 C)  SpO2: 96% 97%   Filed Weights   08/01/18 2226 08/02/18 0103  Weight: 125 lb (56.7 kg) 125 lb (56.7 kg)    Intake/Output from previous day: 07/06 0701 - 07/07 0700 In: 634 [I.V.:41; Blood:493; IV Piggyback:100] Out: -   GENERAL:alert, no distress and comfortable SKIN: skin color, texture, turgor are normal, no rashes or significant lesions EYES: normal, Conjunctiva are pink and  non-injected, sclera clear OROPHARYNX:no exudate, no erythema and lips, buccal mucosa, and tongue normal  NECK: supple, thyroid normal size, non-tender, without nodularity LYMPH:  no palpable lymphadenopathy in the cervical, axillary or inguinal LUNGS: clear to auscultation and percussion with normal breathing effort HEART: regular rate & rhythm and no murmurs and no lower extremity edema ABDOMEN:abdomen soft, non-tender and normal bowel sounds Musculoskeletal:no cyanosis of digits and no clubbing  NEURO: alert & oriented x 3 with fluent speech, no focal motor/sensory deficits  LABORATORY DATA:  I have reviewed the data as listed CMP Latest Ref Rng & Units 08/13/2018 08/12/2018 08/11/2018  Glucose 70 - 99 mg/dL 128(H) 157(H) 139(H)  BUN 6 - 20 mg/dL 17 22(H) 28(H)  Creatinine 0.44 - 1.00 mg/dL 0.52 0.49 0.63  Sodium 135 - 145 mmol/L 135 135 134(L)  Potassium 3.5 - 5.1 mmol/L 4.1 4.0 3.7  Chloride 98 - 111 mmol/L 102 102 101  CO2 22 - 32 mmol/L 25 26 25   Calcium 8.9 - 10.3 mg/dL 7.2(L) 6.9(L) 7.1(L)  Total Protein 6.5 - 8.1 g/dL 6.3(L) 5.5(L) 6.0(L)  Total Bilirubin 0.3 - 1.2 mg/dL 2.3(H) 2.6(H) 3.2(H)  Alkaline Phos 38 - 126 U/L 210(H) 255(H) 415(H)  AST 15 - 41 U/L 54(H) 71(H) 267(H)  ALT 0 - 44 U/L 52(H) 68(H) 118(H)    Lab Results  Component Value Date   WBC 8.2 08/13/2018   HGB 7.4 (L) 08/13/2018   HCT 22.8 (L) 08/13/2018   MCV 91.9 08/13/2018   PLT 14 (LL) 08/13/2018   NEUTROABS 11.5 (H) 08/11/2018    Dg Chest 2 View  Result Date: 08/12/2018 CLINICAL DATA:  33 year old  female with history of AIDS and concern for pneumonia. EXAM: CHEST - 2 VIEW COMPARISON:  Chest CT dated 08/02/2018 and radiograph dated 08/02/2018 FINDINGS: Overall interval improvement of bilateral reticular densities. No focal consolidation, pleural effusion, or pneumothorax. Top-normal cardiac silhouette. No acute osseous pathology. IMPRESSION: Interval improvement of the reticular pulmonary densities since the  prior CT and radiograph. No focal consolidation. Electronically Signed   By: Anner Crete M.D.   On: 08/12/2018 14:12   Nm Hepatobiliary Liver Func  Result Date: 08/10/2018 CLINICAL DATA:  Lower abdominal pain since June 25th.  Nausea. EXAM: NUCLEAR MEDICINE HEPATOBILIARY IMAGING TECHNIQUE: Sequential images of the abdomen were obtained out to 60 minutes following intravenous administration of radiopharmaceutical. RADIOPHARMACEUTICALS:  7.4 mCi Tc-8m Choletec IV COMPARISON:  Ultrasound of 08/08/2018. FINDINGS: Prompt uptake and biliary excretion of activity by the liver is seen. Gallbladder activity is visualized, consistent with patency of cystic duct. Biliary activity passes into small bowel, consistent with patent common bile duct. IMPRESSION: Normal hepatobiliary study, without evidence of acute cholecystitis. Electronically Signed   By: KAbigail MiyamotoM.D.   On: 08/10/2018 16:01   Dg Chest Port 1 View  Result Date: 08/02/2018 CLINICAL DATA:  Pt reported center chest pain and SOB that started a few minutes ago. EXAM: PORTABLE CHEST - 1 VIEW COMPARISON:  none FINDINGS: Worsening bilateral reticulonodular ground-glass opacities. Heart size and mediastinal contours are within normal limits. No effusion. No pneumothorax. Visualized bones unremarkable. IMPRESSION: Continued worsening of bilateral reticulonodular ground-glass opacities. Electronically Signed   By: DLucrezia EuropeM.D.   On: 08/02/2018 15:51   Dg Chest Port 1 View  Result Date: 08/02/2018 CLINICAL DATA:  Fever and hypoxia EXAM: PORTABLE CHEST 1 VIEW COMPARISON:  07/12/2018, 06/18/2018, CT chest 07/05/2018 FINDINGS: Bilateral reticular and ground-glass opacity, increased compared to prior. Normal heart size. No pneumothorax. IMPRESSION: Interval worsening of bilateral reticular and ground-glass infiltrates since radiograph 07/12/2018. Electronically Signed   By: KDonavan FoilM.D.   On: 08/02/2018 00:03   Ct Angio Chest/abd/pel For  Dissection W And/or W/wo  Result Date: 08/02/2018 CLINICAL DATA:  Back pain EXAM: CT ANGIOGRAPHY CHEST, ABDOMEN AND PELVIS TECHNIQUE: Multidetector CT imaging through the chest, abdomen and pelvis was performed using the standard protocol during bolus administration of intravenous contrast. Multiplanar reconstructed images and MIPs were obtained and reviewed to evaluate the vascular anatomy. CONTRAST:  1056mOMNIPAQUE IOHEXOL 350 MG/ML SOLN COMPARISON:  CT chest dated Jul 04, 2018 FINDINGS: CTA CHEST FINDINGS Cardiovascular: Evaluation for pulmonary emboli is limited by respiratory motion artifact. Given this limitation, there is no large centrally located pulmonary embolus. There is no definite dissection. The heart size is normal. There is no large pericardial effusion. The intracardiac blood pool is hypodense relative to the adjacent myocardium consistent with anemia. Mediastinum/Nodes: There are enlarged mediastinal and hilar lymph nodes. For example there is a right paratracheal lymph node measuring approximately 1.4 cm. The thyroid gland is unremarkable. There is mild supraclavicular adenopathy. Lungs/Pleura: There are diffuse ground-glass airspace opacities bilaterally with prominent interlobular septal thickening. There are trace bilateral pleural effusions. There is no pneumothorax. The trachea is unremarkable. Musculoskeletal: No chest wall abnormality. No acute or significant osseous findings. Review of the MIP images confirms the above findings. CTA ABDOMEN AND PELVIS FINDINGS VASCULAR Aorta: Normal caliber aorta without aneurysm, dissection, vasculitis or significant stenosis. Celiac: Patent without evidence of aneurysm, dissection, vasculitis or significant stenosis. SMA: Patent without evidence of aneurysm, dissection, vasculitis or significant stenosis. Renals: Both renal arteries are  patent without evidence of aneurysm, dissection, vasculitis, fibromuscular dysplasia or significant stenosis. IMA:  Patent without evidence of aneurysm, dissection, vasculitis or significant stenosis. Inflow: Patent without evidence of aneurysm, dissection, vasculitis or significant stenosis. Veins: No obvious venous abnormality within the limitations of this arterial phase study. Review of the MIP images confirms the above findings. NON-VASCULAR Hepatobiliary: There is hepatic steatosis. The gallbladder is unremarkable. Pancreas: Unremarkable. No pancreatic ductal dilatation or surrounding inflammatory changes. Spleen: The spleen is significantly enlarged. Adrenals/Urinary Tract: Adrenal glands are unremarkable. Kidneys are normal, without renal calculi, focal lesion, or hydronephrosis. Bladder is unremarkable. Stomach/Bowel: Stomach is within normal limits. Appendix appears normal. No evidence of bowel wall thickening, distention, or inflammatory changes. Lymphatic: There is scattered prominent retroperitoneal and mesenteric lymph nodes. There are prominent inguinal lymph nodes. Reproductive: Uterus and bilateral adnexa are unremarkable. Other: There is mild body wall edema. There is a small volume of free fluid in the pelvis. Musculoskeletal: No acute or significant osseous findings. Review of the MIP images confirms the above findings. IMPRESSION: 1. No evidence of a dissection. 2. Evaluation for pulmonary emboli is limited by motion artifact. Given this limitation there is no large centrally located pulmonary embolus. 3. Again noted are diffuse bilateral ground-glass airspace opacities most likely representing an atypical infectious process. Sarcoidosis can have a similar appearance in the appropriate clinical setting. 4. Trace bilateral pleural effusions. 5. Scattered adenopathy throughout the chest, abdomen, and pelvis, likely related to the patient's underlying history of HIV. 6. Splenomegaly. 7. Hepatic steatosis. 8. Small volume free fluid in the pelvis, likely physiologic. Electronically Signed   By: Constance Holster  M.D.   On: 08/02/2018 23:15   US Abdomen Limited Ruq  Result Date: 08/08/2018 CLINICAL DATA:  Elevated bilirubin. EXAM: ULTRASOUND ABDOMEN LIMITED RIGHT UPPER QUADRANT COMPARISON:  CT abdomen pelvis dated July 11, 2018. FINDINGS: Gallbladder: Sludge. No gallstones. Asymmetric wall thickening measuring up to 5 mm. No sonographic Murphy sign noted by sonographer. Common bile duct: Diameter: 3 mm, normal. Liver: No focal lesion identified. Within normal limits in parenchymal echogenicity. Portal vein is patent on color Doppler imaging with normal direction of blood flow towards the liver. Incidental note is made of splenomegaly. IMPRESSION: 1. Gallbladder sludge with asymmetric wall thickening measuring up to 5 mm, nonspecific. Acute cholecystitis is unlikely in the absence of gallstones or positive sonographic Murphy sign. 2. Splenomegaly. Electronically Signed   By: Titus Dubin M.D.   On: 08/08/2018 15:27    ASSESSMENT AND PLAN: 1. Anemia  2. Thrombocytopenia 3. PCP, COVid19 neg 4. Sepsis, resolved  5. AKI, resolved 6. HIV/AIDS  -Anemia thrombocytopenia are likely multifactorial. She has no evidence of TTP/MAHA based on current labs.   -Bone marrow biopsy results showed no overt evidence of granulomas lymphoma AFB or other overt viral inclusions. No overt evidence of MDS-like changes related to HIV. No megaloblastoid changes. Some minor hemophagocytic forms noted in the bone marrow which is likely reactive given her recurrent infections. No acute interventions for the minor hemophagocytic changes at this time other than treating underlying infectious process or acute inflammatory process.    -Overall picture is consistent with severe sepsis which appears to be driving the patient's worsened blood counts at this time.  Recurrent sepsis could potentially flare her secondary HLH/ hemophagocytosis which could also worsen cytopenias.  The significantly elevated ferritin to 5k would be concerning for  early signs of this. Primary goal would to have the patient sepsis under control. Her antibiotics could potentially also  add to thrombocytopenia and will need to be watched. -Her sepsis has now resolved. It may take some time for her counts to recover. Also not that she is on primaquine which can be contributing to her anemia and thrombocytopenia. If no alternatives to this medication, then continue supportive care.  -Continue folic acid and B complex vitamin to support hematopoiesis. -Transfuse packed red blood cells for hemoglobin less than 7.0 or active bleeding.    -Transfuse platelets for platelet count less than 20,000 if active bleeding.   -Please do 1 hour post count after each use of platelets.    LOS: 11 days   Mikey Bussing, DNP, AGPCNP-BC, AOCNP 08/13/18  ADDENDUM  .Patient's information and chart was independently reviewed in details and case was discussed with Mikey Bussing, DNP. Continues to has multifactorial anemia and thrombocytopenia from resolving sepsis and multiple medications and bone marrow suppression from sepsis and medications. Some element of secondary Hemophagocytosis was a concern from recurrent sepsis. PLAN -mx of sepsis and possible PJP per hospitalist/ID -supportive transfusions for hgb<7.5 and PLT<20k or if bleeding since patient on antimicrobials that are not easily replaceable like Primaquine. -limit other medications that can cause thrombocytopenia. -elevated immature platelet suggest bone marrow starting to respond. ? Possible immune component to thrombocytopenia. -patient is on Prednisone for PJP but if sepsis/infection stable could consider Induction dose of steroids Dexamethasone 70m po/IV daily for 4 days and then back on prednisone. In addition till the infection is under control would be reasonable to consider IVIG Ig/kg daily x 2 doses. This might address immune component for thrombocytopenia and potentially earlier clearance of other  respiratory viral process that might be suppressing the bone marrow as well. -will follow peripherally with labs.  GSullivan LoneMD MS

## 2018-08-13 NOTE — Plan of Care (Signed)

## 2018-08-13 NOTE — Progress Notes (Signed)
PROGRESS NOTE    Krystal Short  IHK:[id number] DOB: 15-Apr-1985 DOA: 08/01/2018 PCP: Lajean Manes, MD   Brief Narrative:  Krystal Short is an 33 y.o. female  with a past medical history significant for AIDS (recent CD4 count was <35 on 06/20/2018),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,questionablesarcoidosis(currently undergoing pulmonary work-up), ASCUS, and anemia.  She was admitted with bilateral lung infiltrates and sepsis.  She was coronavirus 229 e positive.  There was also concern for PCP and she's been on steroids, clindamycin and primaquine.  Hospitalization has been c/b anemia and thrombocytopenia requiring recurrent transfusions.  Heme is following.  Hospitalization also c/b elevated liver enzymes.  GI was consulted.  This is now improving.  At one point in time, there was plan for possible bronchoscopy or VATS, but pt improving from respiratory status at this point in time and plan to hold off for now.  Her anemia and thrombocytopenia continue to be barrier for discharge.  Assessment & Plan:   Active Problems:   Hyponatremia   AIDS (acquired immune deficiency syndrome) (HCC)   Molluscum contagiosum   Symptomatic anemia   Thrombocytopenia (HCC)   Pulmonary infiltrates   Fever   Sepsis (HCC)   Acute respiratory failure with hypoxemia (HCC)   Chest pain   AKI (acute kidney injury) ([place])  Acute hypoxic respiratory failure with fever  Bilateral lung infiltrate and sepsis  - concern is for PCP- VF:IEPPIRJJOA and clindamycin as well as prednisone - ID c/s, appreciate recs - cefepime has been d/c'd - recommending clindamycin, primaquine.   - autoimmune and vasculitis panel unremarkable (negative GBM ab, mpo/pr-3 ab, [place] ab, scl-70 ab, anti CCP, RF, anti DS DNA, ANA) - normal ACE and negative histoplasma antigen - fungal antibodies panel pending - per ID (7/2 note), f/u expanded serologies for dimorphic fungal infection (fungitell negative, aspergillus  pending, cryptococcal antigen negative).  Follow AFB blood cx. - negative urine strep and legionella  -COVID 19 negative -NP swab: coronavirus (229E) -BC NGTD -Parvo IgG + but IgM negative -Pneumocystis smear pending -bronchoscopy on 6/5, biopsy" SCANT LUNG PARENCHYMA WITH FIBROSIS AND REACTIVE CHANGES. - NO GRANULOMATA IDENTIFIED." -Appreciate infectious disease and pulmonology recommendations - per pulmonology, obtain sputum for PCP, fungus, nocardia, actinomyces.  If no answers, consider VATS biopsy (currently plan to defer bronch/VATs as she's improved).   Tachycardia  Tachypnea: mild, likely related to above.  Looks like this has been the case for several days.  Sinus on tele.  Continue to monitor.  Pt appears comfortable when at bedside.  EKG with sinus tach S/p albumin bolus Echo from 6/26 with EF 55-60% (see report) Will increase metoprolol to 50 mg BID - improved rate today  Lactic Acidosis: Not sure of clinical significance of this, she appears hemodynamically stable at this time.   ?poor hepatic clearance.  Will continue to monitor clinically.  Elevated LFT's  Elevated Bilirubin  Elevated Alk Phos  Jaundice:  [place] with gallbladder sludge with asymmetric wall thickening measuring up to 5 mm. GI consult, appreciate recs -> recommending HIDA scan (negative) Improving today - AST, ALT, alk phos, bili improving Follow ASMA (negative), AMA (negative), CMV (pending), HSV (pending)  Intermittent back pain High sensitivity troponin unremarkable, CTA chest/ab negative for dissection , no large PE No midline TTP on my exam today, seems mostly paraspinal, continue to monitor  Hyponatremia -stable  Hypomagnesemia - replace, follow  AKI -resolved  Elevated Troponin: suspect this may be demand in setting of her tachycardia and acute illness above.  Peaked at 110, now downtrending.  She denied any sx of CP.  Low suspicion for ACS.  Echo 6/26 with normal systolic function, EF  57-84%, no regional wall motion abnormalities (see report). Follow A1c (wnl), lipids (LDL not calculated, total cholesterol 101) Would recommending outpatient cards follow up Started on metop for tachy  Anemia plus thrombocytopenia -S/p prbc transfusionx5 -S/p plt x6, will give additional 1 unit today Per heme, suspected multifactorial and likely contribution from severe sepsis with possible secondary HLC/hemophagocytosis  - recommending B complex vitamin and folic acid - transfuse for <7 or active bleeding - transfuse platelets for <20,000 or active bleeding or in the setting of sepsis  - see heme note  Non Anion Gap Metabolic acidosis: Currently on bicarb, increased to 1300 TID Continue to monitor closely, improved Possibly related to HIV and associated illnesses  AIDS CD4=10 06/05/2017 On biktarvy ID following   Hyperglycemia -on steroids -SSI, start mealtime insulin  Sore throat  Concern for thrush: started on nystatin with concern for thrush  SPEP ordered by PM doc as well as 24 hr urine study  DVT prophylaxis: SCD Code Status: full  Family Communication: none at bedside Disposition Plan: pending further improvement, s/o by pulm and ID  Consultants:   ID  Pulm   Heme onc  Procedures:  Echo IMPRESSIONS    1. The left ventricle has normal systolic function, with an ejection fraction of 55-60%. The cavity size was normal. Indeterminate diastolic filling due to E-A fusion. No evidence of left ventricular regional wall motion abnormalities.  2. The right ventricle has normal systolic function. The cavity was normal. There is no increase in right ventricular wall thickness.  3. Small pericardial effusion.  4. No evidence of mitral valve stenosis. Trivial mitral regurgitation.  5. The aortic valve is tricuspid. No stenosis of the aortic valve.  6. The aortic root is normal in size and structure.  7. The IVC was normal in size. No complete TR doppler jet so  unable to estimate PA systolic pressure.  Antimicrobials:  Anti-infectives (From admission, onward)   Start     Dose/Rate Route Frequency Ordered Stop   08/04/18 1000  ceFEPIme (MAXIPIME) 2 g in sodium chloride 0.9 % 100 mL IVPB  Status:  Discontinued     2 g 200 mL/hr over 30 Minutes Intravenous Every 8 hours 08/04/18 0944 08/06/18 1028   08/03/18 0300  vancomycin (VANCOCIN) IVPB 1000 mg/200 mL premix  Status:  Discontinued     1,000 mg 200 mL/hr over 60 Minutes Intravenous Daily 08/02/18 0202 08/02/18 1527   08/02/18 1630  primaquine tablet 30 mg     30 mg Oral Daily 08/02/18 1527     08/02/18 1530  clindamycin (CLEOCIN) IVPB 900 mg     900 mg 100 mL/hr over 30 Minutes Intravenous Every 8 hours 08/02/18 1527     08/02/18 1000  ceFEPIme (MAXIPIME) 2 g in sodium chloride 0.9 % 100 mL IVPB  Status:  Discontinued     2 g 200 mL/hr over 30 Minutes Intravenous Every 12 hours 08/02/18 0202 08/04/18 0944   08/02/18 1000  azithromycin (ZITHROMAX) tablet 250 mg    Note to Pharmacy: Zpack taper as directed     250 mg Oral Daily 08/02/18 0302 08/03/18 1018   08/02/18 1000  bictegravir-emtricitabine-tenofovir AF (BIKTARVY) 50-200-25 MG per tablet 1 tablet     1 tablet Oral Daily 08/02/18 0302     08/01/18 2300  vancomycin (VANCOCIN) 1,250 mg in sodium  chloride 0.9 % 250 mL IVPB     1,250 mg 166.7 mL/hr over 90 Minutes Intravenous  Once 08/01/18 2242 08/02/18 0309   08/01/18 2245  ceFEPIme (MAXIPIME) 2 g in sodium chloride 0.9 % 100 mL IVPB     2 g 200 mL/hr over 30 Minutes Intravenous NOW 08/01/18 2241 08/02/18 0058     Subjective: Feels better overall  Objective: Vitals:   08/12/18 1810 08/12/18 2027 08/13/18 0510 08/13/18 1322  BP:   117/81 111/85  Pulse: (!) 108 (!) 117 (!) 106 88  Resp: _0 Temp: 98 F (36.7 C) 98.5 F (36.9 C) 99 F (37.2 C) 98.3 F (36.8 C)  TempSrc: Oral Oral Oral Oral  SpO2: 99% 98% 96% 97%  Weight:      Height:        Intake/Output Summary  (Last 24 hours) at 08/13/2018 1757 Last data filed at 08/12/2018 2020 Gross per 24 hour  Intake 390 ml  Output -  Net 390 ml   Filed Weights   08/01/18 2226 08/02/18 0103  Weight: 56.7 kg 56.7 kg    Examination:  General: No acute distress. Cardiovascular: Heart sounds show a regular rate, and rhythm. Lungs: Clear to auscultation bilaterally  Abdomen: Soft, nontender, nondistended  Neurological: Alert and oriented 3. Moves all extremities 4. Cranial nerves II through XII grossly intact. Skin: Warm and dry. No rashes or lesions. Extremities: No clubbing or cyanosis. Bilateral 1 + edema. Psychiatric: Mood and affect are normal. Insight and judgment are appropriate.    Data Reviewed: I have personally reviewed following labs and imaging studies  CBC: Recent Labs  Lab 08/06/18 1848 08/07/18 0254  08/09/18 0228  08/10/18 0522 08/10/18 1339 08/11/18 0525 08/12/18 0551 08/13/18 0452  WBC 12.5* 14.2*   < > 18.2*   < > 16.1* 19.4* 14.3* 12.1* 8.2  NEUTROABS 11.5* 12.9*  --  14.6*  --   --   --  11.5*  --   --   HGB 9.6* 9.4*   < > 6.3*   < > 10.0* 10.2* 9.1* 8.1* 7.4*  HCT 29.6* 28.6*   < > 20.1*   < > 30.1* 29.9* 27.4* 24.4* 22.8*  MCV 93.1 91.1   < > 93.1   < > 88.0 88.2 88.7 88.4 91.9  PLT 20* 36*   < > 13*   < > 7* 9* 13* 9* 14*   < > = values in this interval not displayed.   Basic Metabolic Panel: Recent Labs  Lab 08/09/18 0228 08/10/18 0522 08/11/18 0525 08/12/18 0551 08/13/18 0452  NA 135 135 134* 135 135  K 4.0 3.6 3.7 4.0 4.1  CL 105 103 101 102 102  CO2 20* _1 GLUCOSE 168* 174* 139* 157* 128*  BUN 26* 26* 28* 22* 17  CREATININE 0.70 0.60 0.63 0.49 0.52  CALCIUM 7.9* 7.6* 7.1* 6.9* 7.2*  MG 1.7 1.5* 1.7 1.4* 2.0   GFR: Estimated Creatinine Clearance: 76.9 mL/min (by C-G formula based on SCr of 0.52 mg/dL). Liver Function Tests: Recent Labs  Lab 08/09/18 0228 08/09/18 1126 08/10/18 0522 08/11/18 0525 08/12/18 0551 08/13/18 0452  AST  171*  --  100* 267* 71* 54*  ALT 51*  --  69* 118* 68* 52*  ALKPHOS 301*  --  291* 415* 255* 210*  BILITOT 5.2* 5.4* 5.0* 3.2* 2.6* 2.3*  PROT 6.4*  --  6.5 6.0* 5.5* 6.3*  ALBUMIN 2.0*  --  1.9* 1.8* 2.0* 2.2*   No results for input(s): LIPASE, AMYLASE in the last 168 hours. No results for input(s): AMMONIA in the last 168 hours. Coagulation Profile: Recent Labs  Lab 08/09/18 1126 08/10/18 0522 08/13/18 0452  INR 1.4* 1.4* 1.2   Cardiac Enzymes: No results for input(s): CKTOTAL, CKMB, CKMBINDEX, TROPONINI in the last 168 hours. BNP (last 3 results) No results for input(s): PROBNP in the last 8760 hours. HbA1C: No results for input(s): HGBA1C in the last 72 hours. CBG: Recent Labs  Lab 08/12/18 1557 08/12/18 2020 08/13/18 0725 08/13/18 1112 08/13/18 1712  GLUCAP 153* 175* 199* 234* 176*   Lipid Profile: No results for input(s): CHOL, HDL, LDLCALC, TRIG, CHOLHDL, LDLDIRECT in the last 72 hours. Thyroid Function Tests: No results for input(s): TSH, T4TOTAL, FREET4, T3FREE, THYROIDAB in the last 72 hours. Anemia Panel: No results for input(s): VITAMINB12, FOLATE, FERRITIN, TIBC, IRON, RETICCTPCT in the last 72 hours. Sepsis Labs: Recent Labs  Lab 08/07/18 0254 08/07/18 0815 08/07/18 1053 08/07/18 1545 08/08/18 0240  PROCALCITON 26.11  --   --   --   --   LATICACIDVEN  --  1.7 2.9* 3.2* 3.1*    Recent Results (from the past 240 hour(s))  Acid Fast Smear (AFB)     Status: None   Collection Time: 08/07/18  8:35 PM   Specimen: Vein; Blood  Result Value Ref Range Status   AFB Specimen Processing Concentration  Final    Comment: (NOTE) Performed At: [facility] [place], [place] [id number] Rush Farmer MD RW:[id number]    Acid Fast Smear NOSMR  Final    Comment: (NOTE) The acid-fast bacilli (AFB) smear will not be performed due to the specimen source (blood) or submission of an insufficient quantity of specimen.    Source (AFB) BLOOD   Final    Comment: Performed at [facility], [place] [address], [place], [place] [address]  Culture, blood (single)     Status: None (Preliminary result)   Collection Time: 08/09/18  5:46 PM   Specimen: BLOOD RIGHT HAND  Result Value Ref Range Status   Specimen Description   Final    BLOOD RIGHT HAND Performed at [place] [address], [place], [place] [address]    Special Requests   Final    BOTTLES DRAWN AEROBIC ONLY Blood Culture results may not be optimal due to an inadequate volume of blood received in culture bottles Performed at [place] [address] [place], [place] [address]    Culture   Final    NO GROWTH 4 DAYS Performed at [facility], [place] [address] [place], [place] [address]    Report Status PENDING  Incomplete         Radiology Studies: Dg Chest 2 View  Result Date: 08/12/2018 CLINICAL DATA:  33 year old female with history of AIDS and concern for pneumonia. EXAM: CHEST - 2 VIEW COMPARISON:  Chest CT dated 08/02/2018 and radiograph dated 08/02/2018 FINDINGS: Overall interval improvement of bilateral reticular densities. No focal consolidation, pleural effusion, or pneumothorax. Top-normal cardiac silhouette. No acute osseous pathology. IMPRESSION: Interval improvement of the reticular pulmonary densities since the prior CT and radiograph. No focal consolidation. Electronically Signed   By: Anner Crete M.D.   On: 08/12/2018 14:12        Scheduled Meds: . B-complex with vitamin C  1 tablet Oral Daily  . bictegravir-emtricitabine-tenofovir AF  1 tablet Oral Daily  .  dextromethorphan-guaiFENesin  2 tablet Oral BID  . feeding supplement (ENSURE ENLIVE)  237 mL Oral TID BM  . folic acid  2 mg Oral Daily  . insulin aspart  0-15 Units Subcutaneous TID WC  . insulin aspart  0-5 Units Subcutaneous QHS  . insulin aspart  5 Units Subcutaneous TID WC  . lidocaine  2 patch Transdermal Q24H   . mouth rinse  15 mL Mouth Rinse BID  . metoprolol tartrate  50 mg Oral BID  . nystatin  5 mL Oral QID  . pantoprazole  40 mg Oral BID  . polyethylene glycol  17 g Oral BID  . predniSONE  30 mg Oral Q breakfast  . primaquine  30 mg Oral Daily  . sodium bicarbonate  1,300 mg Oral TID   Continuous Infusions: . clindamycin (CLEOCIN) IV 900 mg (08/13/18 1341)     LOS: 11 days    Time spent: over 30 min    Fayrene Helper, MD [facility] Pager AMION  If 7PM-7AM, please contact night-coverage [email or web address] Password [facility] 08/13/2018, 5:57 PM

## 2018-08-14 ENCOUNTER — Ambulatory Visit: Payer: 59 | Admitting: Internal Medicine

## 2018-08-14 DIAGNOSIS — R17 Unspecified jaundice: Secondary | ICD-10-CM

## 2018-08-14 LAB — COMPREHENSIVE METABOLIC PANEL
ALT: 71 U/L — ABNORMAL HIGH (ref 0–44)
AST: 121 U/L — ABNORMAL HIGH (ref 15–41)
Albumin: 2 g/dL — ABNORMAL LOW (ref 3.5–5.0)
Alkaline Phosphatase: 220 U/L — ABNORMAL HIGH (ref 38–126)
Anion gap: 12 (ref 5–15)
BUN: 15 mg/dL (ref 6–20)
CO2: 23 mmol/L (ref 22–32)
Calcium: 6.9 mg/dL — ABNORMAL LOW (ref 8.9–10.3)
Chloride: 100 mmol/L (ref 98–111)
Creatinine, Ser: 0.56 mg/dL (ref 0.44–1.00)
GFR calc Af Amer: >60 mL/min (ref >60)
GFR calc non Af Amer: >60 mL/min (ref >60)
Glucose, Bld: 103 mg/dL — ABNORMAL HIGH (ref 70–99)
Potassium: 3.9 mmol/L (ref 3.5–5.1)
Sodium: 135 mmol/L (ref 135–145)
Total Bilirubin: 1.4 mg/dL — ABNORMAL HIGH (ref 0.3–1.2)
Total Protein: 5.8 g/dL — ABNORMAL LOW (ref 6.5–8.1)

## 2018-08-14 LAB — CBC
HCT: 20.1 % — ABNORMAL LOW (ref 36.0–46.0)
Hemoglobin: 6.4 g/dL — CL (ref 12.0–15.0)
MCH: 29.8 pg (ref 26.0–34.0)
MCHC: 31.8 g/dL (ref 30.0–36.0)
MCV: 93.5 fL (ref 80.0–100.0)
Platelets: 24 10*3/uL — CL (ref 150–400)
RBC: 2.15 MIL/uL — ABNORMAL LOW (ref 3.87–5.11)
RDW: 16.8 % — ABNORMAL HIGH (ref 11.5–15.5)
WBC: 4.9 10*3/uL (ref 4.0–10.5)
nRBC: 3.1 % — ABNORMAL HIGH (ref 0.0–0.2)

## 2018-08-14 LAB — PREPARE RBC (CROSSMATCH)

## 2018-08-14 LAB — CULTURE, BLOOD (SINGLE): Culture: NO GROWTH

## 2018-08-14 LAB — GLUCOSE, CAPILLARY
Glucose-Capillary: 100 mg/dL — ABNORMAL HIGH (ref 70–99)
Glucose-Capillary: 127 mg/dL — ABNORMAL HIGH (ref 70–99)
Glucose-Capillary: 157 mg/dL — ABNORMAL HIGH (ref 70–99)
Glucose-Capillary: 113 mg/dL — ABNORMAL HIGH (ref 70–99)

## 2018-08-14 LAB — PROTIME-INR
INR: 1.3 — ABNORMAL HIGH (ref 0.8–1.2)
Prothrombin Time: 15.8 seconds — ABNORMAL HIGH (ref 11.4–15.2)

## 2018-08-14 LAB — MAGNESIUM: Magnesium: 1.3 mg/dL — ABNORMAL LOW (ref 1.7–2.4)

## 2018-08-14 MED ORDER — MAGNESIUM SULFATE 2 GM/50ML IV SOLN
2.0000 g | Freq: Once | INTRAVENOUS | Status: AC
  Administered 2018-08-14: 2 g via INTRAVENOUS
  Filled 2018-08-14: qty 50

## 2018-08-14 MED ORDER — SODIUM CHLORIDE 0.9% IV SOLUTION
Freq: Once | INTRAVENOUS | Status: AC
  Administered 2018-08-14: 14:00:00 via INTRAVENOUS

## 2018-08-14 NOTE — Progress Notes (Signed)
PROGRESS NOTE  Trust Crago LJQ:[id number] DOB: May 02, 1985 DOA: 08/01/2018 PCP: Lajean Manes, MD  Brief summary: Krystal Short (recent CD4 count was <35 on 06/20/2018),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,questionablesarcoidosis(currently undergoing pulmonary work-up), ASCUS, and anemia.  She was admitted with bilateral lung infiltrates and sepsis.  She was coronavirus 229 e positive.  There was also concern for PCP and she's been on steroids, clindamycin and primaquine.  Hospitalization has been c/b anemia and thrombocytopenia requiring recurrent transfusions.  Heme is following.  Hospitalization also c/b elevated liver enzymes.  GI was consulted.  This is now improving.  At one point in time, there was plan for possible bronchoscopy or VATS, but pt improving from respiratory status at this point in time and plan to hold off for now.  Her anemia and thrombocytopenia continue to be barrier for discharge.    HPI/Recap of past 24 hours:  hgb 6 plt 24, no active bleed  Overall feeling better, she is up in chair, on room air, I did not hear cough, tmax 99.6  Assessment/Plan: Active Problems:   Hyponatremia   Short (acquired immune deficiency syndrome) (HCC)   Molluscum contagiosum   Symptomatic anemia   Thrombocytopenia (HCC)   Pulmonary infiltrates   Fever   Sepsis (HCC)   Acute respiratory failure with hypoxemia (HCC)   Chest pain   AKI (acute kidney injury) (Echo)   Acute hypoxic respiratory failure with fever  Bilateral lung infiltrate and sepsis  - concern is for PCP- [id number] and clindamycin as well as prednisone - ID c/s, appreciate recs - cefepime has been d/c'd - recommending clindamycin, primaquine.   - autoimmune and vasculitis panel unremarkable (negative GBM ab, mpo/pr-3 ab, Ro/La ab, scl-70 ab, anti CCP, RF, anti DS DNA, ANA) - normal ACE and negative histoplasma  antigen - fungal antibodies panel pending - per ID (7/2 note), f/u expanded serologies for dimorphic fungal infection (fungitell negative, aspergillus pending, cryptococcal antigen negative).  Follow AFB blood cx. - negative urine strep and legionella  -COVID 19 negative -NP swab: coronavirus (229E) -BC NGTD -Parvo IgG + but IgM negative -Pneumocystis smear pending -bronchoscopy on 6/5, biopsy" SCANT LUNG PARENCHYMA WITH FIBROSIS AND REACTIVE CHANGES. - NO GRANULOMATA IDENTIFIED." -Appreciate infectious disease and pulmonology recommendations - per pulmonology, obtain sputum for PCP, fungus, nocardia, actinomyces.  If no answers, consider VATS biopsy (currently plan to defer bronch/VATs as she's improved).  -SPEP ordered by PM doc as well as 24 hr urine study  Sinus tachycardia tsh unremarkable Likely reactive, started on lopressor, improving  Anemia/Thrombocytopenia Hematology following Per hematology, transfuse if hgb less than 7 or active bleed transfuse if plt less than 20k or active bleed, check one hr post transfusion plt S/p prbc transfusion x6 S/p plt transfusion x7  Hyponatremia: from dehydration vs SIADH, she already received multiple fluids bolus, urine sodium /osmo tests will not be reliable She does reports drinking a lot of water  Sodium dropped again, today is 129, fluids restriction, repeat labs  Hypomagnesemia Replace, repeat  AKI: Cr 1.23 on presentation, cr normalized , today is 8.26 Metabolic acidosis, stared  sodium bicarb  Elevated LFT's  Elevated Bilirubin  Elevated Alk Phos  Jaundice:  [place] with gallbladder sludge with asymmetric wall thickening measuring up to 5 mm. GI consult, appreciate recs -> recommending HIDA scan (negative) Improving today - AST, ALT, alk phos, bili improving Follow ASMA (negative), AMA (negative), CMV (pending), HSV (pending)  Intermittent  back pain High sensitivity troponin unremarkable, CTA chest/ab negative for  dissection , no large PE No midline TTP on my exam today, seems mostly paraspinal, continue to monitor  Hyperglycemia -on steroids -SSI, start mealtime insulin  Sore throat  Concern for thrush: started on nystatin with concern for thrush   HIV/Short CD4=10 06/05/2017 On biktarvy ID following     Frequent hospitalizations: Review of records summarized by Dr Maudie Mercury on admission: "Admission 4/28-06/11/2017 tx empirically for PCP pneumonia  Admission 1/31-03/10/2018 Acute respiratory failure secondary to pneumonia (rhinovirus) tx with rocephin/ zithromax, Bactrim for PCP Macrocytic anemia   Admission 4/21-4/24/2020 tx empirically for PCP pneumonia with Bactrim and Prednisone CT chest=>enlarged hilar lymph nodes ? Sarcoidosis   Admission 5/28-07/16/2018 tx w Vanco, cefepime, zithromax for pneumonia Bronchoscopy 6/5=> bordetella bronchiseptica tx with zithromax Hematology consulted for thrombocytopenia (not TTP)  Bone marrow biopsy 5/29 nonspecific HGSIL => needs outpatient follow up"  FTT: will get PT eval  Code Status: full  Family Communication: patient   Disposition Plan: needs hgb /plt to be stable, needs ID clearance, needs PT eval   Consultants:  Hematology  ID  Pulmonology/critical care  GI  Procedures:  As above  Antibiotics:  As above   Objective: BP 111/75 (BP Location: Left Arm)   Pulse (!) 114   Temp 99.2 F (37.3 C) (Oral)   Resp 18   Ht 5' 1"  (1.549 m)   Wt 56.7 kg   SpO2 97%   BMI 23.62 kg/m   Intake/Output Summary (Last 24 hours) at 08/14/2018 0858 Last data filed at 08/14/2018 0425 Gross per 24 hour  Intake 425.5 ml  Output -  Net 425.5 ml   Filed Weights   08/01/18 2226 08/02/18 0103  Weight: 56.7 kg 56.7 kg    Exam: Patient is examined daily including today on 08/14/2018, exams remain the same as of yesterday except that has changed    General:  NAD, pleasant  Cardiovascular: RRR  Respiratory: CTABL  Abdomen:  Soft/ND/NT, positive BS  Musculoskeletal: No Edema  Neuro: alert, oriented   Data Reviewed: Basic Metabolic Panel: Recent Labs  Lab 08/10/18 0522 08/11/18 0525 08/12/18 0551 08/13/18 0452 08/14/18 0758  NA 135 134* 135 135 135  K 3.6 3.7 4.0 4.1 3.9  CL 103 101 102 102 100  CO2 24 25 26 25 23   GLUCOSE 174* 139* 157* 128* 103*  BUN 26* 28* 22* 17 15  CREATININE 0.60 0.63 0.49 0.52 0.56  CALCIUM 7.6* 7.1* 6.9* 7.2* 6.9*  MG 1.5* 1.7 1.4* 2.0 1.3*   Liver Function Tests: Recent Labs  Lab 08/10/18 0522 08/11/18 0525 08/12/18 0551 08/13/18 0452 08/14/18 0758  AST 100* 267* 71* 54* 121*  ALT 69* 118* 68* 52* 71*  ALKPHOS 291* 415* 255* 210* 220*  BILITOT 5.0* 3.2* 2.6* 2.3* 1.4*  PROT 6.5 6.0* 5.5* 6.3* 5.8*  ALBUMIN 1.9* 1.8* 2.0* 2.2* 2.0*   No results for input(s): LIPASE, AMYLASE in the last 168 hours. No results for input(s): AMMONIA in the last 168 hours. CBC: Recent Labs  Lab 08/09/18 0228  08/10/18 1339 08/11/18 0525 08/12/18 0551 08/13/18 0452 08/13/18 1907 08/14/18 0758  WBC 18.2*   < > 19.4* 14.3* 12.1* 8.2  --  4.9  NEUTROABS 14.6*  --   --  11.5*  --   --   --   --   HGB 6.3*   < > 10.2* 9.1* 8.1* 7.4* 7.2* 6.4*  HCT 20.1*   < > 29.9* 27.4*  24.4* 22.8* 22.9* 20.1*  MCV 93.1   < > 88.2 88.7 88.4 91.9  --  93.5  PLT 13*   < > 9* 13* 9* 14*  --  24*   < > = values in this interval not displayed.   Cardiac Enzymes:   No results for input(s): CKTOTAL, CKMB, CKMBINDEX, TROPONINI in the last 168 hours. BNP (last 3 results) Recent Labs    03/08/18 2129 08/02/18 1520  BNP 8.8 144.0*    ProBNP (last 3 results) No results for input(s): PROBNP in the last 8760 hours.  CBG: Recent Labs  Lab 08/13/18 0725 08/13/18 1112 08/13/18 1712 08/13/18 2153 08/14/18 0752  GLUCAP 199* 234* 176* 195* 100*    Recent Results (from the past 240 hour(s))  Acid Fast Smear (AFB)     Status: None   Collection Time: 08/07/18  8:35 PM   Specimen: Vein; Blood   Result Value Ref Range Status   AFB Specimen Processing Concentration  Final    Comment: (NOTE) Performed At: [facility] [place], [place] [id number] Rush Farmer MD YW:[id number]    Acid Fast Smear NOSMR  Final    Comment: (NOTE) The acid-fast bacilli (AFB) smear will not be performed due to the specimen source (blood) or submission of an insufficient quantity of specimen.    Source (AFB) BLOOD  Final    Comment: Performed at [facility], [place] [address] [place], [place] [address]  Culture, blood (single)     Status: None (Preliminary result)   Collection Time: 08/09/18  5:46 PM   Specimen: BLOOD RIGHT HAND  Result Value Ref Range Status   Specimen Description   Final    BLOOD RIGHT HAND Performed at [facility] [address], [place], [place] [address]    Special Requests   Final    BOTTLES DRAWN AEROBIC ONLY Blood Culture results may not be optimal due to an inadequate volume of blood received in culture bottles Performed at [facility] [address] [place], [place] [address]    Culture   Final    NO GROWTH 4 DAYS Performed at [facility], [place] [address], [place], [place] [address]    Report Status PENDING  Incomplete     Studies: No results found.  Scheduled Meds: . sodium chloride   Intravenous Once  . sodium chloride   Intravenous Once  . B-complex with vitamin C  1 tablet Oral Daily  . bictegravir-emtricitabine-tenofovir AF  1 tablet Oral Daily  . dextromethorphan-guaiFENesin  2 tablet Oral BID  . feeding supplement (ENSURE ENLIVE)  237 mL Oral TID BM  . folic acid  2 mg Oral Daily  . insulin aspart  0-15 Units Subcutaneous TID WC  . insulin aspart  0-5 Units Subcutaneous QHS  . insulin aspart  5 Units Subcutaneous TID WC  . lidocaine  2 patch Transdermal Q24H  . mouth rinse  15 mL Mouth Rinse BID  . metoprolol tartrate  50 mg Oral BID  . nystatin  5 mL Oral  QID  . pantoprazole  40 mg Oral BID  . polyethylene glycol  17 g Oral BID  . predniSONE  30 mg Oral Q breakfast  . primaquine  30 mg Oral Daily  . sodium bicarbonate  1,300 mg Oral TID    Continuous Infusions: . clindamycin (CLEOCIN) IV 900 mg (08/14/18 0630)     Time spent: 73mns I have personally reviewed and interpreted  on  08/14/2018 daily labs, tele strips, imagings as discussed above under date review session and assessment and plans.  I reviewed all nursing notes, pharmacy notes, consultant notes,  vitals, pertinent old records  I have discussed plan of care as described above with RN , patient  on 08/14/2018   Florencia Reasons MD, PhD  [facility] Pager [phone]. If 7PM-7AM, please contact night-coverage at [email or web address], password [facility] 08/14/2018, 8:58 AM  LOS: 12 days

## 2018-08-14 NOTE — Progress Notes (Signed)
CRITICAL VALUE ALERT  Critical Value:  hgb 6.4, plt 24  Date & Time Notied:  08/14/18 9741  Provider Notified: Erlinda Hong  Orders Received/Actions taken: Awaiting response.

## 2018-08-14 NOTE — Progress Notes (Signed)
[facility] PROGRESS NOTE Subjective Patient still having issues with anemia.  Has bone marrow suppression requiring intermittent PRBCs and platelets.  No gross bleeding  Objective: Vital signs in last 24 hours: Temp:  [98.3 F (36.8 C)-99.6 F (37.6 C)] 99.2 F (37.3 C) (07/08 0758) Pulse Rate:  [88-114] 114 (07/08 0758) Resp:  [18-20] 18 (07/08 0758) BP: (107-118)/(75-85) 111/75 (07/08 0758) SpO2:  [97 %-100 %] 97 % (07/08 0758) Last BM Date: 08/11/18  Intake/Output from previous day: 07/07 0701 - 07/08 0700 In: 425.5 [Blood:425.5] Out: -  Intake/Output this shift: No intake/output data recorded.    Lab Results: Recent Labs    08/12/18 0551 08/13/18 0452 08/13/18 1907 08/14/18 0758  WBC 12.1* 8.2  --  4.9  HGB 8.1* 7.4* 7.2* 6.4*  HCT 24.4* 22.8* 22.9* 20.1*  PLT 9* 14*  --  24*   BMET Recent Labs    08/12/18 0551 08/13/18 0452 08/14/18 0758  NA 135 135 135  K 4.0 4.1 3.9  CL 102 102 100  CO2 26 25 23   CREATININE 0.49 0.52 0.56   LFT Recent Labs    08/12/18 0551 08/13/18 0452 08/14/18 0758  PROT 5.5* 6.3* 5.8*  AST 71* 54* 121*  ALT 68* 52* 71*  ALKPHOS 255* 210* 220*  BILITOT 2.6* 2.3* 1.4*   PT/INR Recent Labs    08/13/18 0452 08/14/18 0758  LABPROT 15.4* 15.8*  INR 1.2 1.3*   PANCREAS No results for input(s): LIPASE in the last 72 hours.       Studies/Results: Dg Chest 2 View  Result Date: 08/12/2018 CLINICAL DATA:  33 year old female with history of AIDS and concern for pneumonia. EXAM: CHEST - 2 VIEW COMPARISON:  Chest CT dated 08/02/2018 and radiograph dated 08/02/2018 FINDINGS: Overall interval improvement of bilateral reticular densities. No focal consolidation, pleural effusion, or pneumothorax. Top-normal cardiac silhouette. No acute osseous pathology. IMPRESSION: Interval improvement of the reticular pulmonary densities since the prior CT and radiograph. No focal consolidation. Electronically Signed   By: Anner Crete M.D.   On: 08/12/2018 14:12    Medications: I have reviewed the patient's current medications.  Assessment:   1.  Abnormal LFTs.  Her liver tests were elevated acutely and now are steadily improving.  Suspect this was due to sepsis.  Autoimmune markers are negative viral markers still pending.  Viral hepatitis markers all negative.  At this point, would suspect her to continue improvement as she recovers from her sepsis. 2.  Anemia/thrombocytopenia.  This does appear to be slowly improving may still need transfusions.   Plan:  We will sign off for now but if things change please feel free to give [place] a call and we will be happy to see again.   Nancy Fetter 08/14/2018, 10:20 AM  This note was created using voice recognition software. Minor errors may Have occurred unintentionally.  Pager: [phone] If no answer or after hours call [phone]

## 2018-08-15 DIAGNOSIS — R87613 High grade squamous intraepithelial lesion on cytologic smear of cervix (HGSIL): Secondary | ICD-10-CM | POA: Diagnosis not present

## 2018-08-15 DIAGNOSIS — Z825 Family history of asthma and other chronic lower respiratory diseases: Secondary | ICD-10-CM | POA: Diagnosis not present

## 2018-08-15 DIAGNOSIS — Z79899 Other long term (current) drug therapy: Secondary | ICD-10-CM | POA: Diagnosis not present

## 2018-08-15 DIAGNOSIS — R739 Hyperglycemia, unspecified: Secondary | ICD-10-CM | POA: Diagnosis not present

## 2018-08-15 DIAGNOSIS — E860 Dehydration: Secondary | ICD-10-CM | POA: Diagnosis not present

## 2018-08-15 DIAGNOSIS — A4189 Other specified sepsis: Secondary | ICD-10-CM | POA: Diagnosis not present

## 2018-08-15 DIAGNOSIS — Z7952 Long term (current) use of systemic steroids: Secondary | ICD-10-CM | POA: Diagnosis not present

## 2018-08-15 DIAGNOSIS — R6520 Severe sepsis without septic shock: Secondary | ICD-10-CM | POA: Diagnosis not present

## 2018-08-15 DIAGNOSIS — Z87891 Personal history of nicotine dependence: Secondary | ICD-10-CM | POA: Diagnosis not present

## 2018-08-15 LAB — CBC WITH DIFFERENTIAL/PLATELET
Abs Immature Granulocytes: 0.08 10*3/uL — ABNORMAL HIGH (ref 0.00–0.07)
Basophils Absolute: 0 10*3/uL (ref 0.0–0.1)
Basophils Relative: 0 %
Eosinophils Absolute: 0 10*3/uL (ref 0.0–0.5)
Eosinophils Relative: 0 %
HCT: 23.9 % — ABNORMAL LOW (ref 36.0–46.0)
Hemoglobin: 7.6 g/dL — ABNORMAL LOW (ref 12.0–15.0)
Immature Granulocytes: 2 %
Lymphocytes Relative: 27 %
Lymphs Abs: 1.1 10*3/uL (ref 0.7–4.0)
MCH: 29.6 pg (ref 26.0–34.0)
MCHC: 31.8 g/dL (ref 30.0–36.0)
MCV: 93 fL (ref 80.0–100.0)
Monocytes Absolute: 0.3 10*3/uL (ref 0.1–1.0)
Monocytes Relative: 7 %
Neutro Abs: 2.8 10*3/uL (ref 1.7–7.7)
Neutrophils Relative %: 64 %
Platelets: 17 10*3/uL — CL (ref 150–400)
RBC: 2.57 MIL/uL — ABNORMAL LOW (ref 3.87–5.11)
RDW: 16.2 % — ABNORMAL HIGH (ref 11.5–15.5)
WBC: 4.3 10*3/uL (ref 4.0–10.5)
nRBC: 2.8 % — ABNORMAL HIGH (ref 0.0–0.2)

## 2018-08-15 LAB — UPEP/UIFE/LIGHT CHAINS/TP, 24-HR UR
% BETA, Urine: 32.9 %
ALPHA 1 URINE: 5.8 %
Albumin, U: 10.6 %
Alpha 2, Urine: 22.7 %
Free Kappa Lt Chains,Ur: 419.11 mg/L — ABNORMAL HIGH (ref 0.63–113.79)
Free Kappa/Lambda Ratio: 1.48 (ref 1.03–31.76)
Free Lambda Lt Chains,Ur: 282.56 mg/L — ABNORMAL HIGH (ref 0.47–11.77)
GAMMA GLOBULIN URINE: 28 %
Total Protein, Urine-Ur/day: 826 mg/24 hr — ABNORMAL HIGH (ref 30–150)
Total Protein, Urine: 23.6 mg/dL
Total Volume: 3500

## 2018-08-15 LAB — BPAM PLATELET PHERESIS
Blood Product Expiration Date: 202007092359
ISSUE DATE / TIME: 202007080124
Unit Type and Rh: 1700

## 2018-08-15 LAB — PREPARE PLATELET PHERESIS: Unit division: 0

## 2018-08-15 LAB — COMPREHENSIVE METABOLIC PANEL
ALT: 81 U/L — ABNORMAL HIGH (ref 0–44)
AST: 149 U/L — ABNORMAL HIGH (ref 15–41)
Albumin: 1.9 g/dL — ABNORMAL LOW (ref 3.5–5.0)
Alkaline Phosphatase: 197 U/L — ABNORMAL HIGH (ref 38–126)
Anion gap: 10 (ref 5–15)
BUN: 13 mg/dL (ref 6–20)
CO2: 24 mmol/L (ref 22–32)
Calcium: 6.7 mg/dL — ABNORMAL LOW (ref 8.9–10.3)
Chloride: 100 mmol/L (ref 98–111)
Creatinine, Ser: 0.54 mg/dL (ref 0.44–1.00)
GFR calc Af Amer: >60 mL/min (ref >60)
GFR calc non Af Amer: >60 mL/min (ref >60)
Glucose, Bld: 112 mg/dL — ABNORMAL HIGH (ref 70–99)
Potassium: 3.8 mmol/L (ref 3.5–5.1)
Sodium: 134 mmol/L — ABNORMAL LOW (ref 135–145)
Total Bilirubin: 1.4 mg/dL — ABNORMAL HIGH (ref 0.3–1.2)
Total Protein: 5.6 g/dL — ABNORMAL LOW (ref 6.5–8.1)

## 2018-08-15 LAB — BPAM RBC
Blood Product Expiration Date: 202008092359
ISSUE DATE / TIME: 202007081450
Unit Type and Rh: 5100

## 2018-08-15 LAB — GLUCOSE, CAPILLARY
Glucose-Capillary: 156 mg/dL — ABNORMAL HIGH (ref 70–99)
Glucose-Capillary: 107 mg/dL — ABNORMAL HIGH (ref 70–99)
Glucose-Capillary: 196 mg/dL — ABNORMAL HIGH (ref 70–99)
Glucose-Capillary: 83 mg/dL (ref 70–99)

## 2018-08-15 LAB — TYPE AND SCREEN
ABO/RH(D): O POS
Antibody Screen: NEGATIVE
Unit division: 0

## 2018-08-15 LAB — MAGNESIUM: Magnesium: 1.4 mg/dL — ABNORMAL LOW (ref 1.7–2.4)

## 2018-08-15 MED ORDER — ENSURE ENLIVE PO LIQD
237.0000 mL | ORAL | Status: DC
  Administered 2018-08-19: 237 mL via ORAL

## 2018-08-15 MED ORDER — FUROSEMIDE 10 MG/ML IJ SOLN
20.0000 mg | Freq: Once | INTRAMUSCULAR | Status: AC
  Administered 2018-08-15: 20 mg via INTRAVENOUS
  Filled 2018-08-15: qty 2

## 2018-08-15 MED ORDER — MAGNESIUM SULFATE 2 GM/50ML IV SOLN
2.0000 g | Freq: Once | INTRAVENOUS | Status: AC
  Administered 2018-08-15: 2 g via INTRAVENOUS
  Filled 2018-08-15: qty 50

## 2018-08-15 NOTE — Progress Notes (Signed)
PROGRESS NOTE  Krystal Short GGY:[id number] DOB: 01-24-1986 DOA: 08/01/2018 PCP: Lajean Manes, MD  Brief summary: Krystal Graingeris an 33 y.o.femalewith a past medical history significant for AIDS (recent CD4 count was <35 on 06/20/2018),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,questionablesarcoidosis(currently undergoing pulmonary work-up), ASCUS, and anemia.  She was admitted with bilateral lung infiltrates and sepsis.  She was coronavirus 229 e positive.  There was also concern for PCP and she's been on steroids, clindamycin and primaquine.  Hospitalization has been c/b anemia and thrombocytopenia requiring recurrent transfusions.  Heme is following.  Hospitalization also c/b elevated liver enzymes.  GI was consulted.  This is now improving.  At one point in time, there was plan for possible bronchoscopy or VATS, but pt improving from respiratory status at this point in time and plan to hold off for now.  Her anemia and thrombocytopenia continue to be barrier for discharge.    HPI/Recap of past 24 hours:  hgb 7.6 plt 17, no active bleed   on room air, I did not hear cough, tmax 98.6, she ambulated   Assessment/Plan: Active Problems:   Hyponatremia   AIDS (acquired immune deficiency syndrome) (HCC)   Molluscum contagiosum   Symptomatic anemia   Thrombocytopenia (HCC)   Pulmonary infiltrates   Fever   Sepsis (HCC)   Acute respiratory failure with hypoxemia (HCC)   Chest pain   AKI (acute kidney injury) (HCC)   Elevated bilirubin   Acute hypoxic respiratory failure with fever  Bilateral lung infiltrate and sepsis  - concern is for PCP- [id number] and clindamycin as well as prednisone - ID c/s, appreciate recs - cefepime has been d/c'd - recommending clindamycin, primaquine.   - autoimmune and vasculitis panel unremarkable (negative GBM ab, mpo/pr-3 ab, Ro/La ab, scl-70 ab, anti CCP, RF, anti DS DNA, ANA) - normal ACE and negative histoplasma  antigen - fungal antibodies panel pending - per ID (7/2 note), f/u expanded serologies for dimorphic fungal infection (fungitell negative, aspergillus pending, cryptococcal antigen negative).  Follow AFB blood cx. - negative urine strep and legionella  -COVID 19 negative -NP swab: coronavirus (229E) -BC NGTD -Parvo IgG + but IgM negative -Pneumocystis smear pending -bronchoscopy on 6/5, biopsy" SCANT LUNG PARENCHYMA WITH FIBROSIS AND REACTIVE CHANGES. - NO GRANULOMATA IDENTIFIED." -Appreciate infectious disease and pulmonology recommendations - per pulmonology, obtain sputum for PCP, fungus, nocardia, actinomyces.  If no answers, consider VATS biopsy (currently plan to defer bronch/VATs as she's improved).  -SPEP ordered by PM doc as well as 24 hr urine study  Sinus tachycardia tsh unremarkable Likely reactive, started on lopressor, improving  Anemia/Thrombocytopenia Hematology following Per hematology, transfuse if hgb less than 7 or active bleed transfuse if plt less than 20k or active bleed, check one hr post transfusion plt S/p prbc transfusion x6 S/p plt transfusion x7  Hyponatremia: from dehydration vs SIADH, she already received multiple fluids bolus, urine sodium /osmo tests will not be reliable She does reports drinking a lot of water  Sodium dropped again, today is 129, fluids restriction, repeat labs  Hypomagnesemia Replace, repeat  AKI: Cr 1.23 on presentation, cr normalized , today is 9.37 Metabolic acidosis, stared  sodium bicarb  Elevated LFT's  Elevated Bilirubin  Elevated Alk Phos  Jaundice:  [place] with gallbladder sludge with asymmetric wall thickening measuring up to 5 mm. GI consult, appreciate recs -> recommending HIDA scan (negative) Improving today - AST, ALT, alk phos, bili improving Follow ASMA (negative), AMA (negative), CMV (pending), HSV (pending)  Intermittent  back pain High sensitivity troponin unremarkable, CTA chest/ab negative for  dissection , no large PE No midline TTP on my exam today, seems mostly paraspinal, continue to monitor  Hyperglycemia -on steroids -SSI, start mealtime insulin  Sore throat  Concern for thrush: started on nystatin with concern for thrush   HIV/AIDS CD4=10 06/05/2017 On biktarvy ID following     Frequent hospitalizations: Review of records summarized by Dr Maudie Mercury on admission: "Admission 4/28-06/11/2017 tx empirically for PCP pneumonia  Admission 1/31-03/10/2018 Acute respiratory failure secondary to pneumonia (rhinovirus) tx with rocephin/ zithromax, Bactrim for PCP Macrocytic anemia   Admission 4/21-4/24/2020 tx empirically for PCP pneumonia with Bactrim and Prednisone CT chest=>enlarged hilar lymph nodes ? Sarcoidosis   Admission 5/28-07/16/2018 tx w Vanco, cefepime, zithromax for pneumonia Bronchoscopy 6/5=> bordetella bronchiseptica tx with zithromax Hematology consulted for thrombocytopenia (not TTP)  Bone marrow biopsy 5/29 nonspecific HGSIL => needs outpatient follow up"  FTT: she did well PT eval  Code Status: full  Family Communication: patient   Disposition Plan: needs hgb /plt to be stable, needs ID clearance,  Per ID , will need finish pcp treatment here and hopefully cbc will be stable and not transfusion dependent, then will likely need outpatient open lung biopsy   Consultants:  Hematology  ID  Pulmonology/critical care  GI  Procedures:  As above  Antibiotics:  As above   Objective: BP 102/64 (BP Location: Left Arm)   Pulse (!) 103   Temp 98.6 F (37 C) (Oral)   Resp 20   Ht 5' 1"  (1.549 m)   Wt 56.7 kg   SpO2 98%   BMI 23.62 kg/m   Intake/Output Summary (Last 24 hours) at 08/15/2018 1515 Last data filed at 08/14/2018 1800 Gross per 24 hour  Intake 315.72 ml  Output -  Net 315.72 ml   Filed Weights   08/01/18 2226 08/02/18 0103  Weight: 56.7 kg 56.7 kg    Exam: Patient is examined daily including today on 08/15/2018,  exams remain the same as of yesterday except that has changed    General:  NAD, pleasant  Cardiovascular: RRR  Respiratory: CTABL  Abdomen: Soft/ND/NT, positive BS  Musculoskeletal: No Edema  Neuro: alert, oriented   Data Reviewed: Basic Metabolic Panel: Recent Labs  Lab 08/11/18 0525 08/12/18 0551 08/13/18 0452 08/14/18 0758 08/15/18 0518  NA 134* 135 135 135 134*  K 3.7 4.0 4.1 3.9 3.8  CL 101 102 102 100 100  CO2 25 26 25 23 24   GLUCOSE 139* 157* 128* 103* 112*  BUN 28* 22* 17 15 13   CREATININE 0.63 0.49 0.52 0.56 0.54  CALCIUM 7.1* 6.9* 7.2* 6.9* 6.7*  MG 1.7 1.4* 2.0 1.3* 1.4*   Liver Function Tests: Recent Labs  Lab 08/11/18 0525 08/12/18 0551 08/13/18 0452 08/14/18 0758 08/15/18 0518  AST 267* 71* 54* 121* 149*  ALT 118* 68* 52* 71* 81*  ALKPHOS 415* 255* 210* 220* 197*  BILITOT 3.2* 2.6* 2.3* 1.4* 1.4*  PROT 6.0* 5.5* 6.3* 5.8* 5.6*  ALBUMIN 1.8* 2.0* 2.2* 2.0* 1.9*   No results for input(s): LIPASE, AMYLASE in the last 168 hours. No results for input(s): AMMONIA in the last 168 hours. CBC: Recent Labs  Lab 08/09/18 0228  08/11/18 0525 08/12/18 0551 08/13/18 0452 08/13/18 1907 08/14/18 0758 08/15/18 0518  WBC 18.2*   < > 14.3* 12.1* 8.2  --  4.9 4.3  NEUTROABS 14.6*  --  11.5*  --   --   --   --  2.8  HGB 6.3*   < > 9.1* 8.1* 7.4* 7.2* 6.4* 7.6*  HCT 20.1*   < > 27.4* 24.4* 22.8* 22.9* 20.1* 23.9*  MCV 93.1   < > 88.7 88.4 91.9  --  93.5 93.0  PLT 13*   < > 13* 9* 14*  --  24* 17*   < > = values in this interval not displayed.   Cardiac Enzymes:   No results for input(s): CKTOTAL, CKMB, CKMBINDEX, TROPONINI in the last 168 hours. BNP (last 3 results) Recent Labs    03/08/18 2129 08/02/18 1520  BNP 8.8 144.0*    ProBNP (last 3 results) No results for input(s): PROBNP in the last 8760 hours.  CBG: Recent Labs  Lab 08/14/18 1210 08/14/18 1652 08/14/18 2149 08/15/18 0738 08/15/18 1202  GLUCAP 127* 157* 113* 156* 107*     Recent Results (from the past 240 hour(s))  Acid Fast Smear (AFB)     Status: None   Collection Time: 08/07/18  8:35 PM   Specimen: Vein; Blood  Result Value Ref Range Status   AFB Specimen Processing Concentration  Final    Comment: (NOTE) Performed At: [facility] [place], [place] [id number] Rush Farmer MD UY:[id number]    Acid Fast Smear NOSMR  Final    Comment: (NOTE) The acid-fast bacilli (AFB) smear will not be performed due to the specimen source (blood) or submission of an insufficient quantity of specimen.    Source (AFB) BLOOD  Final    Comment: Performed at [facility], [place] [address] [place], [place] [address]  Culture, blood (single)     Status: None   Collection Time: 08/09/18  5:46 PM   Specimen: BLOOD RIGHT HAND  Result Value Ref Range Status   Specimen Description   Final    BLOOD RIGHT HAND Performed at [facility] [address] [place], [place] [address]    Special Requests   Final    BOTTLES DRAWN AEROBIC ONLY Blood Culture results may not be optimal due to an inadequate volume of blood received in culture bottles Performed at [facility] [address], [place], [place] [address]    Culture   Final    NO GROWTH 5 DAYS Performed at [facility], [place] [address] [place],  [address]    Report Status 08/14/2018 FINAL  Final     Studies: No results found.  Scheduled Meds: . sodium chloride   Intravenous Once  . B-complex with vitamin C  1 tablet Oral Daily  . bictegravir-emtricitabine-tenofovir AF  1 tablet Oral Daily  . dextromethorphan-guaiFENesin  2 tablet Oral BID  . [START ON 08/16/2018] feeding supplement (ENSURE ENLIVE)  237 mL Oral Q24H  . folic acid  2 mg Oral Daily  . furosemide  20 mg Intravenous Once  . insulin aspart  0-15 Units Subcutaneous TID WC  . insulin aspart  0-5 Units Subcutaneous QHS  . insulin aspart  5 Units Subcutaneous  TID WC  . lidocaine  2 patch Transdermal Q24H  . mouth rinse  15 mL Mouth Rinse BID  . metoprolol tartrate  50 mg Oral BID  . nystatin  5 mL Oral QID  . pantoprazole  40 mg Oral BID  . polyethylene glycol  17 g Oral BID  . predniSONE  30 mg Oral Q breakfast  . primaquine  30 mg Oral Daily  . sodium bicarbonate  1,300 mg Oral TID  Continuous Infusions: . clindamycin (CLEOCIN) IV 900 mg (08/15/18 0626)  . magnesium sulfate bolus IVPB 2 g (08/15/18 1423)     Time spent: 49mns I have personally reviewed and interpreted on  08/15/2018 daily labs,  imagings as discussed above under date review session and assessment and plans.  I reviewed all nursing notes, pharmacy notes, consultant notes,  vitals, pertinent old records  I have discussed plan of care as described above with RN , patient  on 08/15/2018   FFlorencia ReasonsMD, PhD  [facility] Pager [phone] If 7PM-7AM, please contact night-coverage at [email or web address], password [facility]7/10/2018, 3:15 PM  LOS: 13 days

## 2018-08-15 NOTE — Evaluation (Signed)
Physical Therapy Evaluation Patient Details Name: Krystal Short MRN: [id number] DOB: May 17, 1985 Today's Date: 08/15/2018   History of Present Illness  Krystal Short is an 33 y.o. female  with a past medical history significant for AIDS (recent CD4 count was <35 on 06/20/2018),  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, questionable sarcoidosis (currently undergoing pulmonary work-up), ASCUS, and anemia.She was admitted with bilateral lung infiltrates and sepsis.  Clinical Impression  Patient presents with decreased mobility due to generalized weakness, LE edema, decreased balance and will benefit from skilled PT in the acute setting to allow return home with family support.  Currently min A for ambulation and previously was independent.  Feel she will progress without follow up PT, but will follow acutely.     Follow Up Recommendations No PT follow up    Equipment Recommendations  None recommended by PT    Recommendations for Other Services       Precautions / Restrictions Precautions Precautions: Fall      Mobility  Bed Mobility Overal bed mobility: Modified Independent                Transfers Overall transfer level: Needs assistance   Transfers: Sit to/from Stand Sit to Stand: Supervision         General transfer comment: increased time to rise from EOB  Ambulation/Gait Ambulation/Gait assistance: Min assist;Min guard Gait Distance (Feet): 180 Feet Assistive device: None Gait Pattern/deviations: Step-through pattern;Decreased stride length;Wide base of support     General Gait Details: feet turned out with swelling in feet/ankles  Stairs            Wheelchair Mobility    Modified Rankin (Stroke Patients Only)       Balance Overall balance assessment: Needs assistance Sitting-balance support: Feet supported Sitting balance-Leahy Scale: Good       Standing balance-Leahy Scale: Good Standing balance comment: able to stand and  pull up shorts with S no UE support                             Pertinent Vitals/Pain Pain Assessment: No/denies pain    Home Living Family/patient expects to be discharged to:: Private residence   Available Help at Discharge: Family Type of Home: Mobile home Home Access: Stairs to enter Entrance Stairs-Rails: Left Entrance Stairs-Number of Steps: 4 Home Layout: One level Home Equipment: None      Prior Function Level of Independence: Independent         Comments: at times spouse has to help with IADL's     Hand Dominance   Dominant Hand: Right    Extremity/Trunk Assessment   Upper Extremity Assessment Upper Extremity Assessment: Overall WFL for tasks assessed    Lower Extremity Assessment Lower Extremity Assessment: LLE deficits/detail;RLE deficits/detail RLE Deficits / Details: edema in feet, AROM WFL, strength hip flexion 3/5, knee extension 4/5, ankle DF 3+/5 LLE Deficits / Details: edema in feet, AROM WFL, strength hip flexion 3/5, knee extension 4/5, ankle DF 3+/5       Communication   Communication: No difficulties  Cognition Arousal/Alertness: Awake/alert Behavior During Therapy: WFL for tasks assessed/performed Overall Cognitive Status: Within Functional Limits for tasks assessed                                        General Comments General comments (skin integrity,  edema, etc.): Encouraged to walk to bathroom with staff, but states she walked unaided twice yesterday, encouraged sit<>stand for LE strengthening    Exercises     Assessment/Plan    PT Assessment Patient needs continued PT services  PT Problem List Decreased strength;Decreased activity tolerance;Decreased mobility;Decreased balance;Decreased knowledge of use of DME;Decreased safety awareness       PT Treatment Interventions DME instruction;Therapeutic activities;Stair training;Balance training;Therapeutic exercise;Functional mobility training;Gait  training;Patient/family education    PT Goals (Current goals can be found in the Care Plan section)  Acute Rehab PT Goals Patient Stated Goal: To get stronger PT Goal Formulation: With patient Time For Goal Achievement: 08/22/18 Potential to Achieve Goals: Good    Frequency Min 3X/week   Barriers to discharge        Co-evaluation               AM-PAC PT "6 Clicks" Mobility  Outcome Measure Help needed turning from your back to your side while in a flat bed without using bedrails?: None Help needed moving from lying on your back to sitting on the side of a flat bed without using bedrails?: None Help needed moving to and from a bed to a chair (including a wheelchair)?: A Little Help needed standing up from a chair using your arms (e.g., wheelchair or bedside chair)?: A Little Help needed to walk in hospital room?: A Little Help needed climbing 3-5 steps with a railing? : A Little 6 Click Score: 20    End of Session   Activity Tolerance: Patient tolerated treatment well Patient left: in bed   PT Visit Diagnosis: Muscle weakness (generalized) (M62.81);Other abnormalities of gait and mobility (R26.89)    Time: 3254-9826 PT Time Calculation (min) (ACUTE ONLY): 18 min   Charges:   PT Evaluation $PT Eval Low Complexity: [place], PT Acute Rehabilitation Services [phone] 08/15/2018   Reginia Naas 08/15/2018, 1:05 PM

## 2018-08-15 NOTE — Progress Notes (Signed)
Nutrition Follow-up  RD working remotely.   DOCUMENTATION CODES:   Not applicable  INTERVENTION:  - will decrease Ensure Enlive from BID to once/day. - continue to encourage PO intakes. - will continue to encourage PO intakes.  - weigh patient today.    NUTRITION DIAGNOSIS:   Increased nutrient needs related to chronic illness as evidenced by estimated needs. -ongoing  GOAL:   Patient will meet greater than or equal to 90% of their needs -met on average  MONITOR:   PO intake, Supplement acceptance, Labs, Weight trends, I & O's  ASSESSMENT:   33 y.o. female with past medical history significant for AIDS ,  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, questionable sarcoidosis , ASCUS, and chronic anemia was admitted to the hospital for further evaluation of cough and dyspnea.  She is now having elevated LFTs  Patient has not been weighed since 6/26. She is on droplet precautions. Per RN flow sheet, she consumed 100% of breakfast and dinner and 70% of lunch (total of 1667 kcal, 86 grams protein) on 7/5 and no other intakes documented since that time. Patient reports that patient has now returned to baseline and she is able to eat without any abdominal pain or N/V and that these symptoms have resolved. Ensure Enlive was ordered BID on 7/2 and patient has accepted this supplement ~25% of the time offered.  Per notes: - GI last saw patient on 7/8 and signed off at that time - ongoing anemia issues and bone marrow suppression requiring intermittent PRBCs and platelets - ongoing testing and rule outs d/t acute respiratory issues - AKI - metabolic acidosis - elevated LFTs with HIDA scan, ASMA, AMA all negative; CMV and HSV are pending - intermittent back pain - hx of HIV/AIDS   Medications reviewed; 1 tablet B-complex with vitamin C/day, 2 mg folvite/day, sliding scale novolog, 5 units novolog TID, 2 g IV Mg sulfate x1 run 7/8, 5 ml mycostatin QID, 40 mg oral protonix  BID, 1 packet miralax BID, 30 mg deltasone/day, 1300 mg oral sodium bicarb TID.  Labs reviewed; CBGs: 156 and 107 mg/dl today, Na: 134 mmol/l, Ca: 6.7 mg/dl, Mg: 1.4 mg/dl, Alk Phos and LFTs elevated.      NUTRITION - FOCUSED PHYSICAL EXAM:  unable to complete at this time.   Diet Order:   Diet Order            Diet Carb Modified Fluid consistency: Thin; Room service appropriate? Yes  Diet effective now              EDUCATION NEEDS:   No education needs have been identified at this time  Skin:  Skin Assessment: Reviewed RN Assessment  Last BM:  7/7  Height:   Ht Readings from Last 1 Encounters:  08/02/18 5' 1"  (1.549 m)    Weight:   Wt Readings from Last 1 Encounters:  08/02/18 56.7 kg    Ideal Body Weight:  47.7 kg  BMI:  Body mass index is 23.62 kg/m.  Estimated Nutritional Needs:   Kcal:  1700-1900  Protein:  85-95g  Fluid:  1.9L/day      Jarome Matin, MS, RD, LDN, CNSC Inpatient Clinical Dietitian Pager # [phone] After hours/weekend pager # [phone]

## 2018-08-15 NOTE — Progress Notes (Signed)
PCCM Interval Note  On chart review, patient on room air and recent CXR on 7/6 with interval improvement of bilateral airspace densities. Currently on antibiotic regimen with empiric PCP treatment per ID. I agree with recommendations documented by Dr. Halford Chessman on 7/7. Patient does not have indication for bronchoscopy with clinical improvement. PCP sputum pending, however patient already on appropriate management.   She is scheduled for follow-up with Pulmonary clinic on 7/24 with Dr. Melvyn Novas.  Discussed plan with [facility]-Hospitalist, Dr. Erlinda Hong. Pulmonary will sign off. Please call for re-consult if needed.  Rodman Pickle, M.D. [facility] Pulmonary/Critical Care Medicine Pager: [phone] After hours pager: [phone]

## 2018-08-16 ENCOUNTER — Other Ambulatory Visit (HOSPITAL_COMMUNITY): Payer: Self-pay | Admitting: *Deleted

## 2018-08-16 ENCOUNTER — Other Ambulatory Visit (HOSPITAL_COMMUNITY): Payer: 59

## 2018-08-16 ENCOUNTER — Institutional Professional Consult (permissible substitution): Payer: 59 | Admitting: Internal Medicine

## 2018-08-16 DIAGNOSIS — A4189 Other specified sepsis: Secondary | ICD-10-CM | POA: Diagnosis not present

## 2018-08-16 DIAGNOSIS — R739 Hyperglycemia, unspecified: Secondary | ICD-10-CM | POA: Diagnosis not present

## 2018-08-16 DIAGNOSIS — R6520 Severe sepsis without septic shock: Secondary | ICD-10-CM | POA: Diagnosis not present

## 2018-08-16 DIAGNOSIS — R87613 High grade squamous intraepithelial lesion on cytologic smear of cervix (HGSIL): Secondary | ICD-10-CM | POA: Diagnosis not present

## 2018-08-16 DIAGNOSIS — Z7952 Long term (current) use of systemic steroids: Secondary | ICD-10-CM | POA: Diagnosis not present

## 2018-08-16 DIAGNOSIS — Z825 Family history of asthma and other chronic lower respiratory diseases: Secondary | ICD-10-CM | POA: Diagnosis not present

## 2018-08-16 DIAGNOSIS — Z87891 Personal history of nicotine dependence: Secondary | ICD-10-CM | POA: Diagnosis not present

## 2018-08-16 DIAGNOSIS — E860 Dehydration: Secondary | ICD-10-CM | POA: Diagnosis not present

## 2018-08-16 DIAGNOSIS — Z79899 Other long term (current) drug therapy: Secondary | ICD-10-CM | POA: Diagnosis not present

## 2018-08-16 LAB — CBC WITH DIFFERENTIAL/PLATELET
Abs Immature Granulocytes: 0.08 10*3/uL — ABNORMAL HIGH (ref 0.00–0.07)
Basophils Absolute: 0 10*3/uL (ref 0.0–0.1)
Basophils Relative: 0 %
Eosinophils Absolute: 0 10*3/uL (ref 0.0–0.5)
Eosinophils Relative: 0 %
HCT: 24.1 % — ABNORMAL LOW (ref 36.0–46.0)
Hemoglobin: 7.6 g/dL — ABNORMAL LOW (ref 12.0–15.0)
Immature Granulocytes: 2 %
Lymphocytes Relative: 33 %
Lymphs Abs: 1.2 10*3/uL (ref 0.7–4.0)
MCH: 29.7 pg (ref 26.0–34.0)
MCHC: 31.5 g/dL (ref 30.0–36.0)
MCV: 94.1 fL (ref 80.0–100.0)
Monocytes Absolute: 0.4 10*3/uL (ref 0.1–1.0)
Monocytes Relative: 9 %
Neutro Abs: 2.1 10*3/uL (ref 1.7–7.7)
Neutrophils Relative %: 56 %
Platelets: 15 10*3/uL — CL (ref 150–400)
RBC: 2.56 MIL/uL — ABNORMAL LOW (ref 3.87–5.11)
RDW: 16.5 % — ABNORMAL HIGH (ref 11.5–15.5)
WBC: 3.8 10*3/uL — ABNORMAL LOW (ref 4.0–10.5)
nRBC: 1.9 % — ABNORMAL HIGH (ref 0.0–0.2)

## 2018-08-16 LAB — GLUCOSE, CAPILLARY
Glucose-Capillary: 211 mg/dL — ABNORMAL HIGH (ref 70–99)
Glucose-Capillary: 97 mg/dL (ref 70–99)
Glucose-Capillary: 102 mg/dL — ABNORMAL HIGH (ref 70–99)
Glucose-Capillary: 151 mg/dL — ABNORMAL HIGH (ref 70–99)
Glucose-Capillary: 67 mg/dL — ABNORMAL LOW (ref 70–99)
Glucose-Capillary: 79 mg/dL (ref 70–99)

## 2018-08-16 LAB — COMPREHENSIVE METABOLIC PANEL
ALT: 84 U/L — ABNORMAL HIGH (ref 0–44)
AST: 122 U/L — ABNORMAL HIGH (ref 15–41)
Albumin: 2.1 g/dL — ABNORMAL LOW (ref 3.5–5.0)
Alkaline Phosphatase: 194 U/L — ABNORMAL HIGH (ref 38–126)
Anion gap: 8 (ref 5–15)
BUN: 9 mg/dL (ref 6–20)
CO2: 24 mmol/L (ref 22–32)
Calcium: 7 mg/dL — ABNORMAL LOW (ref 8.9–10.3)
Chloride: 101 mmol/L (ref 98–111)
Creatinine, Ser: 0.49 mg/dL (ref 0.44–1.00)
GFR calc Af Amer: >60 mL/min (ref >60)
GFR calc non Af Amer: >60 mL/min (ref >60)
Glucose, Bld: 91 mg/dL (ref 70–99)
Potassium: 3.9 mmol/L (ref 3.5–5.1)
Sodium: 133 mmol/L — ABNORMAL LOW (ref 135–145)
Total Bilirubin: 1.3 mg/dL — ABNORMAL HIGH (ref 0.3–1.2)
Total Protein: 5.8 g/dL — ABNORMAL LOW (ref 6.5–8.1)

## 2018-08-16 LAB — MAGNESIUM: Magnesium: 1.6 mg/dL — ABNORMAL LOW (ref 1.7–2.4)

## 2018-08-16 MED ORDER — FUROSEMIDE 10 MG/ML IJ SOLN
40.0000 mg | Freq: Once | INTRAMUSCULAR | Status: AC
  Administered 2018-08-16: 40 mg via INTRAVENOUS
  Filled 2018-08-16: qty 4

## 2018-08-16 MED ORDER — MAGNESIUM SULFATE 2 GM/50ML IV SOLN
2.0000 g | Freq: Once | INTRAVENOUS | Status: AC
  Administered 2018-08-16: 2 g via INTRAVENOUS
  Filled 2018-08-16: qty 50

## 2018-08-16 NOTE — Progress Notes (Signed)
Hypoglycemic Event  CBG: 67  Treatment:4 oz juice           Symptoms: asymptomatic  Follow-up CBG: Time:2139 CBG Result: 79  Possible Reasons for Event: Lack of PO intake. Pt was waiting on meal to eat  Comments/MD notified: B. Irma Newness Dunkelberger

## 2018-08-16 NOTE — Progress Notes (Signed)
PROGRESS NOTE  Krystal Short HTM:[id number] DOB: 04-Sep-1985 DOA: 08/01/2018 PCP: Lajean Manes, MD  Brief summary: Krystal Short an 33 y.o.femalewith a past medical history significant for AIDS (recent CD4 count was <35 on 06/20/2018),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,questionablesarcoidosis(currently undergoing pulmonary work-up), ASCUS, and anemia.  She was admitted with bilateral lung infiltrates and sepsis.  She was coronavirus 229 e positive.  There was also concern for PCP and she's been on steroids, clindamycin and primaquine.  Hospitalization has been c/b anemia and thrombocytopenia requiring recurrent transfusions.  Heme is following.  Hospitalization also c/b elevated liver enzymes.  GI was consulted.  This is now improving.  At one point in time, there was plan for possible bronchoscopy or VATS, but pt improving from respiratory status at this point in time and plan to hold off for now.  Her anemia and thrombocytopenia continue to be barrier for discharge.    HPI/Recap of past 24 hours:  hgb 7.6 plt 15, no active bleed   on room air, I did not hear cough, tmax 98.6, she ambulated  She reports lasix helped , edema improved, agrees to another dose of lasix today  Assessment/Plan: Active Problems:   Hyponatremia   AIDS (acquired immune deficiency syndrome) (HCC)   Molluscum contagiosum   Symptomatic anemia   Thrombocytopenia (HCC)   Pulmonary infiltrates   Fever   Sepsis (HCC)   Acute respiratory failure with hypoxemia (HCC)   Chest pain   AKI (acute kidney injury) (HCC)   Elevated bilirubin   Acute hypoxic respiratory failure with fever  Bilateral lung infiltrate and sepsis  - concern is for PCP- EC:[id number] and clindamycin as well as prednisone - ID c/s, appreciate recs - cefepime has been d/c'd - recommending clindamycin, primaquine.   - autoimmune and vasculitis panel unremarkable (negative GBM ab, mpo/pr-3 ab, [place] ab, scl-70  ab, anti CCP, RF, anti DS DNA, ANA) - normal ACE and negative histoplasma antigen - fungal antibodies panel pending - per ID (7/2 note), f/u expanded serologies for dimorphic fungal infection (fungitell negative, aspergillus pending, cryptococcal antigen negative).  Follow AFB blood cx. - negative urine strep and legionella  -COVID 19 negative -NP swab: coronavirus (229E) -BC NGTD -Parvo IgG + but IgM negative -Pneumocystis smear pending -bronchoscopy on 6/5, biopsy" SCANT LUNG PARENCHYMA WITH FIBROSIS AND REACTIVE CHANGES. - NO GRANULOMATA IDENTIFIED." -Appreciate infectious disease and pulmonology recommendations - per pulmonology, obtain sputum for PCP, fungus, nocardia, actinomyces.  If no answers, consider VATS biopsy (currently plan to defer bronch/VATs as she's improved).  -SPEP ordered by PM doc as well as 24 hr urine study  Sinus tachycardia tsh unremarkable Likely reactive, started on lopressor, improving  Anemia/Thrombocytopenia Hematology following Per hematology, transfuse if hgb less than 7 or active bleed transfuse if plt less than 20k or active bleed, check one hr post transfusion plt S/p prbc transfusion x6 S/p plt transfusion x7  Hyponatremia: from dehydration vs SIADH, she already received multiple fluids bolus, urine sodium /osmo tests will not be reliable She does reports drinking a lot of water  Sodium dropped again, today is 129, fluids restriction, repeat labs  Hypomagnesemia Replace, repeat  AKI: Cr 1.23 on presentation, cr normalized , today is 5.18 Metabolic acidosis, stared  sodium bicarb  Elevated LFT's  Elevated Bilirubin  Elevated Alk Phos  Jaundice:  [place] with gallbladder sludge with asymmetric wall thickening measuring up to 5 mm. GI consult, appreciate recs -> recommending HIDA scan (negative) Improving today - AST, ALT, alk  phos, bili improving Follow ASMA (negative), AMA (negative), CMV (pending), HSV (pending)  Intermittent back pain  High sensitivity troponin unremarkable, CTA chest/ab negative for dissection , no large PE No midline TTP on my exam today, seems mostly paraspinal, continue to monitor  Hyperglycemia -on steroids -SSI, start mealtime insulin  Sore throat  Concern for thrush: started on nystatin with concern for thrush   HIV/AIDS CD4=10 06/05/2017 On biktarvy ID following     Frequent hospitalizations: Review of records summarized by Dr Maudie Mercury on admission: "Admission 4/28-06/11/2017 tx empirically for PCP pneumonia  Admission 1/31-03/10/2018 Acute respiratory failure secondary to pneumonia (rhinovirus) tx with rocephin/ zithromax, Bactrim for PCP Macrocytic anemia   Admission 4/21-4/24/2020 tx empirically for PCP pneumonia with Bactrim and Prednisone CT chest=>enlarged hilar lymph nodes ? Sarcoidosis   Admission 5/28-07/16/2018 tx w Vanco, cefepime, zithromax for pneumonia Bronchoscopy 6/5=> bordetella bronchiseptica tx with zithromax Hematology consulted for thrombocytopenia (not TTP)  Bone marrow biopsy 5/29 nonspecific HGSIL => needs outpatient follow up"  FTT: she did well PT eval  Code Status: full  Family Communication: patient   Disposition Plan: needs hgb /plt to be stable, needs ID clearance,  Per ID , will need finish pcp treatment here and hopefully cbc will be stable and not transfusion dependent, then will likely need outpatient open lung biopsy   Consultants:  Hematology  ID  Pulmonology/critical care  GI  Procedures:  As above  Antibiotics:  As above   Objective: BP 105/76 (BP Location: Left Arm)   Pulse 97   Temp 98.6 F (37 C) (Oral)   Resp 20   Ht _0  (1.549 m)   Wt 56.7 kg   SpO2 96%   BMI 23.62 kg/m   Intake/Output Summary (Last 24 hours) at 08/16/2018 1229 Last data filed at 08/16/2018 1000 Gross per 24 hour  Intake 250 ml  Output -  Net 250 ml   Filed Weights   08/01/18 2226 08/02/18 0103  Weight: 56.7 kg 56.7 kg    Exam:  Patient is examined daily including today on 08/16/2018, exams remain the same as of yesterday except that has changed    General:  NAD, pleasant  Cardiovascular: RRR  Respiratory: CTABL  Abdomen: Soft/ND/NT, positive BS  Musculoskeletal: trace Edema  Neuro: alert, oriented   Data Reviewed: Basic Metabolic Panel: Recent Labs  Lab 08/12/18 0551 08/13/18 0452 08/14/18 0758 08/15/18 0518 08/16/18 0511  NA 135 135 135 134* 133*  K 4.0 4.1 3.9 3.8 3.9  CL 102 102 100 100 101  CO2 _1 GLUCOSE 157* 128* 103* 112* 91  BUN 22* _2 CREATININE 0.49 0.52 0.56 0.54 0.49  CALCIUM 6.9* 7.2* 6.9* 6.7* 7.0*  MG 1.4* 2.0 1.3* 1.4* 1.6*   Liver Function Tests: Recent Labs  Lab 08/12/18 0551 08/13/18 0452 08/14/18 0758 08/15/18 0518 08/16/18 0511  AST 71* 54* 121* 149* 122*  ALT 68* 52* 71* 81* 84*  ALKPHOS 255* 210* 220* 197* 194*  BILITOT 2.6* 2.3* 1.4* 1.4* 1.3*  PROT 5.5* 6.3* 5.8* 5.6* 5.8*  ALBUMIN 2.0* 2.2* 2.0* 1.9* 2.1*   No results for input(s): LIPASE, AMYLASE in the last 168 hours. No results for input(s): AMMONIA in the last 168 hours. CBC: Recent Labs  Lab 08/11/18 0525 08/12/18 0551 08/13/18 0452 08/13/18 1907 08/14/18 0758 08/15/18 0518 08/16/18 0511  WBC 14.3* 12.1* 8.2  --  4.9 4.3 3.8*  NEUTROABS 11.5*  --   --   --   --  2.8 2.1  HGB 9.1* 8.1* 7.4* 7.2* 6.4* 7.6* 7.6*  HCT 27.4* 24.4* 22.8* 22.9* 20.1* 23.9* 24.1*  MCV 88.7 88.4 91.9  --  93.5 93.0 94.1  PLT 13* 9* 14*  --  24* 17* 15*   Cardiac Enzymes:   No results for input(s): CKTOTAL, CKMB, CKMBINDEX, TROPONINI in the last 168 hours. BNP (last 3 results) Recent Labs    03/08/18 2129 08/02/18 1520  BNP 8.8 144.0*    ProBNP (last 3 results) No results for input(s): PROBNP in the last 8760 hours.  CBG: Recent Labs  Lab 08/15/18 1202 08/15/18 1722 08/15/18 2238 08/16/18 0735 08/16/18 1157  GLUCAP 107* 196* 83 97 102*    Recent Results (from the past 240  hour(s))  Acid Fast Smear (AFB)     Status: None   Collection Time: 08/07/18  8:35 PM   Specimen: Vein; Blood  Result Value Ref Range Status   AFB Specimen Processing Concentration  Final    Comment: (NOTE) Performed At: [facility] [place], [place] [id number] Rush Farmer MD OM:[id number]    Acid Fast Smear NOSMR  Final    Comment: (NOTE) The acid-fast bacilli (AFB) smear will not be performed due to the specimen source (blood) or submission of an insufficient quantity of specimen.    Source (AFB) BLOOD  Final    Comment: Performed at [facility], [place] [address] [place], [place] [address]  Culture, blood (single)     Status: None   Collection Time: 08/09/18  5:46 PM   Specimen: BLOOD RIGHT HAND  Result Value Ref Range Status   Specimen Description   Final    BLOOD RIGHT HAND Performed at [facility] [address], [place], [place] [address]    Special Requests   Final    BOTTLES DRAWN AEROBIC ONLY Blood Culture results may not be optimal due to an inadequate volume of blood received in culture bottles Performed at [facility] [address] [place], [place] [address]    Culture   Final    NO GROWTH 5 DAYS Performed at [facility], [place] [address], [place], [place] [address]    Report Status 08/14/2018 FINAL  Final     Studies: No results found.  Scheduled Meds: . sodium chloride   Intravenous Once  . B-complex with vitamin C  1 tablet Oral Daily  . bictegravir-emtricitabine-tenofovir AF  1 tablet Oral Daily  . dextromethorphan-guaiFENesin  2 tablet Oral BID  . feeding supplement (ENSURE ENLIVE)  237 mL Oral Q24H  . folic acid  2 mg Oral Daily  . furosemide  40 mg Intravenous Once  . insulin aspart  0-15 Units Subcutaneous TID WC  . insulin aspart  0-5 Units Subcutaneous QHS  . insulin aspart  5 Units Subcutaneous TID WC  . lidocaine  2 patch Transdermal Q24H  . mouth  rinse  15 mL Mouth Rinse BID  . metoprolol tartrate  50 mg Oral BID  . nystatin  5 mL Oral QID  . pantoprazole  40 mg Oral BID  . polyethylene glycol  17 g Oral BID  . predniSONE  30 mg Oral Q breakfast  . primaquine  30 mg Oral Daily  . sodium bicarbonate  1,300 mg Oral TID    Continuous Infusions: . clindamycin (CLEOCIN) IV 900 mg (08/16/18 0630)     Time spent: 50mns, case discussed with ID I have personally reviewed and interpreted on  08/16/2018 daily labs,  imagings as discussed above under date review session and assessment and plans.  I reviewed all nursing notes, pharmacy notes, consultant notes,  vitals, pertinent old records  I have discussed plan of care as described above with RN , patient  on 08/16/2018   Florencia Reasons MD, PhD  [facility] Pager [phone]. If 7PM-7AM, please contact night-coverage at [email or web address], password [facility] 08/16/2018, 12:29 PM  LOS: 14 days

## 2018-08-17 LAB — CMV DNA BY PCR, QUALITATIVE: CMV DNA, Qual PCR: POSITIVE — AB

## 2018-08-17 LAB — CBC WITH DIFFERENTIAL/PLATELET
Abs Immature Granulocytes: 0.05 10*3/uL (ref 0.00–0.07)
Basophils Absolute: 0 10*3/uL (ref 0.0–0.1)
Basophils Relative: 0 %
Eosinophils Absolute: 0 10*3/uL (ref 0.0–0.5)
Eosinophils Relative: 0 %
HCT: 21.6 % — ABNORMAL LOW (ref 36.0–46.0)
Hemoglobin: 6.9 g/dL — CL (ref 12.0–15.0)
Immature Granulocytes: 2 %
Lymphocytes Relative: 34 %
Lymphs Abs: 1.1 10*3/uL (ref 0.7–4.0)
MCH: 30.1 pg (ref 26.0–34.0)
MCHC: 31.9 g/dL (ref 30.0–36.0)
MCV: 94.3 fL (ref 80.0–100.0)
Monocytes Absolute: 0.5 10*3/uL (ref 0.1–1.0)
Monocytes Relative: 14 %
Neutro Abs: 1.7 10*3/uL (ref 1.7–7.7)
Neutrophils Relative %: 50 %
Platelets: 15 10*3/uL — CL (ref 150–400)
RBC: 2.29 MIL/uL — ABNORMAL LOW (ref 3.87–5.11)
RDW: 16.5 % — ABNORMAL HIGH (ref 11.5–15.5)
WBC: 3.3 10*3/uL — ABNORMAL LOW (ref 4.0–10.5)
nRBC: 1.5 % — ABNORMAL HIGH (ref 0.0–0.2)

## 2018-08-17 LAB — COMPREHENSIVE METABOLIC PANEL
ALT: 81 U/L — ABNORMAL HIGH (ref 0–44)
AST: 104 U/L — ABNORMAL HIGH (ref 15–41)
Albumin: 2.1 g/dL — ABNORMAL LOW (ref 3.5–5.0)
Alkaline Phosphatase: 180 U/L — ABNORMAL HIGH (ref 38–126)
Anion gap: 8 (ref 5–15)
BUN: 9 mg/dL (ref 6–20)
CO2: 26 mmol/L (ref 22–32)
Calcium: 7.2 mg/dL — ABNORMAL LOW (ref 8.9–10.3)
Chloride: 101 mmol/L (ref 98–111)
Creatinine, Ser: 0.51 mg/dL (ref 0.44–1.00)
GFR calc Af Amer: >60 mL/min (ref >60)
GFR calc non Af Amer: >60 mL/min (ref >60)
Glucose, Bld: 92 mg/dL (ref 70–99)
Potassium: 3.8 mmol/L (ref 3.5–5.1)
Sodium: 135 mmol/L (ref 135–145)
Total Bilirubin: 1.4 mg/dL — ABNORMAL HIGH (ref 0.3–1.2)
Total Protein: 6 g/dL — ABNORMAL LOW (ref 6.5–8.1)

## 2018-08-17 LAB — GLUCOSE, CAPILLARY
Glucose-Capillary: 119 mg/dL — ABNORMAL HIGH (ref 70–99)
Glucose-Capillary: 138 mg/dL — ABNORMAL HIGH (ref 70–99)
Glucose-Capillary: 123 mg/dL — ABNORMAL HIGH (ref 70–99)
Glucose-Capillary: 99 mg/dL (ref 70–99)

## 2018-08-17 LAB — MAGNESIUM: Magnesium: 1.7 mg/dL (ref 1.7–2.4)

## 2018-08-17 MED ORDER — INSULIN ASPART 100 UNIT/ML ~~LOC~~ SOLN
3.0000 [IU] | Freq: Three times a day (TID) | SUBCUTANEOUS | Status: DC
  Administered 2018-08-17 – 2018-08-22 (×13): 3 [IU] via SUBCUTANEOUS

## 2018-08-17 MED ORDER — MAGNESIUM SULFATE 2 GM/50ML IV SOLN
2.0000 g | Freq: Once | INTRAVENOUS | Status: AC
  Administered 2018-08-17: 2 g via INTRAVENOUS
  Filled 2018-08-17: qty 50

## 2018-08-17 MED ORDER — FUROSEMIDE 10 MG/ML IJ SOLN
40.0000 mg | Freq: Once | INTRAMUSCULAR | Status: AC
  Administered 2018-08-17: 40 mg via INTRAVENOUS
  Filled 2018-08-17: qty 4

## 2018-08-17 MED ORDER — GUAIFENESIN ER 600 MG PO TB12
600.0000 mg | ORAL_TABLET | Freq: Two times a day (BID) | ORAL | Status: DC
  Administered 2018-08-17 – 2018-08-22 (×11): 600 mg via ORAL
  Filled 2018-08-17 (×11): qty 1

## 2018-08-17 NOTE — Plan of Care (Signed)

## 2018-08-17 NOTE — Progress Notes (Signed)
PROGRESS NOTE  Krystal Short:[id number] DOB: 04-Aug-1985 DOA: 08/01/2018 PCP: Lajean Manes, MD  Brief summary: Krystal Short an 33 y.o.femalewith a past medical history significant for AIDS (recent CD4 count was <35 on 06/20/2018),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,questionablesarcoidosis(currently undergoing pulmonary work-up), ASCUS, and anemia.  She was admitted with bilateral lung infiltrates and sepsis.  She was coronavirus 229 e positive.  There was also concern for PCP and she's been on steroids, clindamycin and primaquine.  Hospitalization has been c/b anemia and thrombocytopenia requiring recurrent transfusions.  Heme is following.  Hospitalization also c/b elevated liver enzymes.  GI was consulted.  This is now improving.  At one point in time, there was plan for possible bronchoscopy or VATS, but pt improving from respiratory status at this point in time and plan to hold off for now.  Her anemia and thrombocytopenia continue to be barrier for discharge.    HPI/Recap of past 24 hours:  hgb 6.9, denies weakness, no chest pain, no sob plt 15, no active bleed  Has hypoglycemia event this am,she reports her breakfast was late this am  She states lasix held reducing the edema, she would like to have another dose today  Assessment/Plan: Active Problems:   Hyponatremia   AIDS (acquired immune deficiency syndrome) (HCC)   Molluscum contagiosum   Symptomatic anemia   Thrombocytopenia (HCC)   Pulmonary infiltrates   Fever   Sepsis (HCC)   Acute respiratory failure with hypoxemia (HCC)   Chest pain   AKI (acute kidney injury) (HCC)   Elevated bilirubin   Acute hypoxic respiratory failure with fever  Bilateral lung infiltrate and sepsis  - concern is for PCP- WC:HENIDPOEUM and clindamycin as well as prednisone - ID c/s, appreciate recs - cefepime has been d/c'd - recommending clindamycin, primaquine.   - autoimmune and vasculitis panel  unremarkable (negative GBM ab, mpo/pr-3 ab, Ro/La ab, scl-70 ab, anti CCP, RF, anti DS DNA, ANA) - normal ACE and negative histoplasma antigen - fungal antibodies panel pending - per ID (7/2 note), f/u expanded serologies for dimorphic fungal infection (fungitell negative, aspergillus pending, cryptococcal antigen negative).  Follow AFB blood cx. - negative urine strep and legionella  -COVID 19 negative -NP swab: coronavirus (229E) -BC NGTD -Parvo IgG + but IgM negative -Pneumocystis smear pending -bronchoscopy on 6/5, biopsy" SCANT LUNG PARENCHYMA WITH FIBROSIS AND REACTIVE CHANGES. - NO GRANULOMATA IDENTIFIED." -Appreciate infectious disease and pulmonology recommendations - per pulmonology, obtain sputum for PCP, fungus, nocardia, actinomyces.  If no answers, consider VATS biopsy (currently plan to defer bronch/VATs as she's improved).  -SPEP ordered by PM doc as well as 24 hr urine study  Sinus tachycardia tsh unremarkable Likely reactive, started on lopressor, improving  Anemia/Thrombocytopenia Hematology following Per hematology, transfuse if hgb less than 7 or active bleed transfuse if plt less than 20k or active bleed, check one hr post transfusion plt S/p prbc transfusion x6 S/p plt transfusion x7  Hyponatremia: from dehydration vs SIADH, she already received multiple fluids bolus, urine sodium /osmo tests will not be reliable She does reports drinking a lot of water  Sodium dropped again, today is 129, fluids restriction, repeat labs  Hypomagnesemia Replace, repeat  AKI: Cr 1.23 on presentation, cr normalized , today is 3.53 Metabolic acidosis, treated with  sodium bicarb, resolved, d/c sodium bicarb, monitor  Elevated LFT's  Elevated Bilirubin  Elevated Alk Phos  Jaundice:  [place] with gallbladder sludge with asymmetric wall thickening measuring up to 5 mm. GI consult,  appreciate recs -> recommending HIDA scan (negative) Improving today - AST, ALT, alk phos, bili  improving Follow ASMA (negative), AMA (negative), CMV (pending), HSV (pending)  Intermittent back pain High sensitivity troponin unremarkable, CTA chest/ab negative for dissection , no large PE No midline TTP on my exam today, seems mostly paraspinal, continue to monitor  Hyperglycemia -on steroids -now has hypoglycemia , decrease meal time insulin, monitor   Sore throat  Concern for thrush: started on nystatin with concern for thrush   HIV/AIDS CD4=10 06/05/2017 On biktarvy ID following     Frequent hospitalizations: Review of records summarized by Dr Maudie Mercury on admission: "Admission 4/28-06/11/2017 tx empirically for PCP pneumonia  Admission 1/31-03/10/2018 Acute respiratory failure secondary to pneumonia (rhinovirus) tx with rocephin/ zithromax, Bactrim for PCP Macrocytic anemia   Admission 4/21-4/24/2020 tx empirically for PCP pneumonia with Bactrim and Prednisone CT chest=>enlarged hilar lymph nodes ? Sarcoidosis   Admission 5/28-07/16/2018 tx w Vanco, cefepime, zithromax for pneumonia Bronchoscopy 6/5=> bordetella bronchiseptica tx with zithromax Hematology consulted for thrombocytopenia (not TTP)  Bone marrow biopsy 5/29 nonspecific HGSIL => needs outpatient follow up"  FTT: she did well PT eval  Code Status: full  Family Communication: patient   Disposition Plan: needs hgb /plt to be stable, needs ID clearance,  Per ID , will need finish pcp treatment here and hopefully cbc will be stable and not transfusion dependent, then will likely need outpatient open lung biopsy   Consultants:  Hematology  ID  Pulmonology/critical care  GI  Procedures:  As above  Antibiotics:  As above   Objective: BP 105/71 (BP Location: Right Arm)   Pulse (!) 110   Temp 98.2 F (36.8 C) (Oral)   Resp 14   Ht 5' 1"  (1.549 m)   Wt 56.7 kg   SpO2 97%   BMI 23.62 kg/m   Intake/Output Summary (Last 24 hours) at 08/17/2018 0817 Last data filed at 08/16/2018  1759 Gross per 24 hour  Intake 290 ml  Output 1550 ml  Net -1260 ml   Filed Weights   08/01/18 2226 08/02/18 0103  Weight: 56.7 kg 56.7 kg    Exam: Patient is examined daily including today on 08/17/2018, exams remain the same as of yesterday except that has changed    General:  NAD, pleasant  Cardiovascular: RRR  Respiratory: CTABL  Abdomen: Soft/ND/NT, positive BS  Musculoskeletal: trace Edema  Neuro: alert, oriented   Data Reviewed: Basic Metabolic Panel: Recent Labs  Lab 08/13/18 0452 08/14/18 0758 08/15/18 0518 08/16/18 0511 08/17/18 0607  NA 135 135 134* 133* 135  K 4.1 3.9 3.8 3.9 3.8  CL 102 100 100 101 101  CO2 25 23 24 24 26   GLUCOSE 128* 103* 112* 91 92  BUN 17 15 13 9 9   CREATININE 0.52 0.56 0.54 0.49 0.51  CALCIUM 7.2* 6.9* 6.7* 7.0* 7.2*  MG 2.0 1.3* 1.4* 1.6* 1.7   Liver Function Tests: Recent Labs  Lab 08/13/18 0452 08/14/18 0758 08/15/18 0518 08/16/18 0511 08/17/18 0607  AST 54* 121* 149* 122* 104*  ALT 52* 71* 81* 84* 81*  ALKPHOS 210* 220* 197* 194* 180*  BILITOT 2.3* 1.4* 1.4* 1.3* 1.4*  PROT 6.3* 5.8* 5.6* 5.8* 6.0*  ALBUMIN 2.2* 2.0* 1.9* 2.1* 2.1*   No results for input(s): LIPASE, AMYLASE in the last 168 hours. No results for input(s): AMMONIA in the last 168 hours. CBC: Recent Labs  Lab 08/11/18 0525  08/13/18 4665 08/13/18 1907 08/14/18 0758 08/15/18 0518 08/16/18 0511 08/17/18  0607  WBC 14.3*   < > 8.2  --  4.9 4.3 3.8* 3.3*  NEUTROABS 11.5*  --   --   --   --  2.8 2.1 PENDING  HGB 9.1*   < > 7.4* 7.2* 6.4* 7.6* 7.6* 6.9*  HCT 27.4*   < > 22.8* 22.9* 20.1* 23.9* 24.1* 21.6*  MCV 88.7   < > 91.9  --  93.5 93.0 94.1 94.3  PLT 13*   < > 14*  --  24* 17* 15* 15*   < > = values in this interval not displayed.   Cardiac Enzymes:   No results for input(s): CKTOTAL, CKMB, CKMBINDEX, TROPONINI in the last 168 hours. BNP (last 3 results) Recent Labs    03/08/18 2129 08/02/18 1520  BNP 8.8 144.0*    ProBNP (last  3 results) No results for input(s): PROBNP in the last 8760 hours.  CBG: Recent Labs  Lab 08/16/18 1157 08/16/18 1645 08/16/18 2049 08/16/18 2139 08/17/18 0816  GLUCAP 102* 151* 67* 79 119*    Recent Results (from the past 240 hour(s))  Acid Fast Smear (AFB)     Status: None   Collection Time: 08/07/18  8:35 PM   Specimen: Vein; Blood  Result Value Ref Range Status   AFB Specimen Processing Concentration  Final    Comment: (NOTE) Performed At: [facility], [place] [id number] Rush Farmer MD YQ:[id number]    Acid Fast Smear NOSMR  Final    Comment: (NOTE) The acid-fast bacilli (AFB) smear will not be performed due to the specimen source (blood) or submission of an insufficient quantity of specimen.    Source (AFB) BLOOD  Final    Comment: Performed at [facility], [place] [address] [place], [place] [address]  Culture, blood (single)     Status: None   Collection Time: 08/09/18  5:46 PM   Specimen: BLOOD RIGHT HAND  Result Value Ref Range Status   Specimen Description   Final    BLOOD RIGHT HAND Performed at [facility] [address], [place], [place] [address]    Special Requests   Final    BOTTLES DRAWN AEROBIC ONLY Blood Culture results may not be optimal due to an inadequate volume of blood received in culture bottles Performed at [facility] [address] [place], [place] [address]    Culture   Final    NO GROWTH 5 DAYS Performed at [facility], [place] [address], [place], [place] [address]    Report Status 08/14/2018 FINAL  Final     Studies: No results found.  Scheduled Meds: . sodium chloride   Intravenous Once  . B-complex with vitamin C  1 tablet Oral Daily  . bictegravir-emtricitabine-tenofovir AF  1 tablet Oral Daily  . feeding supplement (ENSURE ENLIVE)  237 mL Oral Q24H  . folic acid  2 mg Oral Daily  . insulin aspart  0-15 Units Subcutaneous  TID WC  . insulin aspart  3 Units Subcutaneous TID WC  . lidocaine  2 patch Transdermal Q24H  . mouth rinse  15 mL Mouth Rinse BID  . metoprolol tartrate  50 mg Oral BID  . nystatin  5 mL Oral QID  . pantoprazole  40 mg Oral BID  . polyethylene glycol  17 g Oral BID  . predniSONE  30 mg Oral Q breakfast  . primaquine  30 mg Oral Daily    Continuous Infusions: .  clindamycin (CLEOCIN) IV 900 mg (08/17/18 0558)  . magnesium sulfate bolus IVPB       Time spent: 55mns,  I have personally reviewed and interpreted on  08/17/2018 daily labs,  imagings as discussed above under date review session and assessment and plans.  I reviewed all nursing notes, pharmacy notes, consultant notes,  vitals, pertinent old records  I have discussed plan of care as described above with RN , patient  on 08/17/2018   FFlorencia ReasonsMD, PhD  [facility] Pager [phone] If 7PM-7AM, please contact night-coverage at [email or web address], password [facility]7/12/2018, 8:17 AM  LOS: 15 days

## 2018-08-18 LAB — COMPREHENSIVE METABOLIC PANEL
ALT: 73 U/L — ABNORMAL HIGH (ref 0–44)
AST: 79 U/L — ABNORMAL HIGH (ref 15–41)
Albumin: 2.3 g/dL — ABNORMAL LOW (ref 3.5–5.0)
Alkaline Phosphatase: 168 U/L — ABNORMAL HIGH (ref 38–126)
Anion gap: 9 (ref 5–15)
BUN: 10 mg/dL (ref 6–20)
CO2: 24 mmol/L (ref 22–32)
Calcium: 7.7 mg/dL — ABNORMAL LOW (ref 8.9–10.3)
Chloride: 100 mmol/L (ref 98–111)
Creatinine, Ser: 0.59 mg/dL (ref 0.44–1.00)
GFR calc Af Amer: >60 mL/min (ref >60)
GFR calc non Af Amer: >60 mL/min (ref >60)
Glucose, Bld: 95 mg/dL (ref 70–99)
Potassium: 3.9 mmol/L (ref 3.5–5.1)
Sodium: 133 mmol/L — ABNORMAL LOW (ref 135–145)
Total Bilirubin: 1.4 mg/dL — ABNORMAL HIGH (ref 0.3–1.2)
Total Protein: 6.4 g/dL — ABNORMAL LOW (ref 6.5–8.1)

## 2018-08-18 LAB — CBC WITH DIFFERENTIAL/PLATELET
Abs Immature Granulocytes: 0.05 10*3/uL (ref 0.00–0.07)
Basophils Absolute: 0 10*3/uL (ref 0.0–0.1)
Basophils Relative: 0 %
Eosinophils Absolute: 0 10*3/uL (ref 0.0–0.5)
Eosinophils Relative: 0 %
HCT: 22.9 % — ABNORMAL LOW (ref 36.0–46.0)
Hemoglobin: 7.1 g/dL — ABNORMAL LOW (ref 12.0–15.0)
Immature Granulocytes: 2 %
Lymphocytes Relative: 35 %
Lymphs Abs: 1 10*3/uL (ref 0.7–4.0)
MCH: 29.8 pg (ref 26.0–34.0)
MCHC: 31 g/dL (ref 30.0–36.0)
MCV: 96.2 fL (ref 80.0–100.0)
Monocytes Absolute: 0.4 10*3/uL (ref 0.1–1.0)
Monocytes Relative: 12 %
Neutro Abs: 1.5 10*3/uL — ABNORMAL LOW (ref 1.7–7.7)
Neutrophils Relative %: 51 %
Platelets: 19 10*3/uL — CL (ref 150–400)
RBC: 2.38 MIL/uL — ABNORMAL LOW (ref 3.87–5.11)
RDW: 17.4 % — ABNORMAL HIGH (ref 11.5–15.5)
WBC: 3 10*3/uL — ABNORMAL LOW (ref 4.0–10.5)
nRBC: 0.7 % — ABNORMAL HIGH (ref 0.0–0.2)

## 2018-08-18 LAB — GLUCOSE, CAPILLARY
Glucose-Capillary: 136 mg/dL — ABNORMAL HIGH (ref 70–99)
Glucose-Capillary: 195 mg/dL — ABNORMAL HIGH (ref 70–99)
Glucose-Capillary: 93 mg/dL (ref 70–99)
Glucose-Capillary: 123 mg/dL — ABNORMAL HIGH (ref 70–99)

## 2018-08-18 LAB — RETICULOCYTES
Immature Retic Fract: 33.7 % — ABNORMAL HIGH (ref 2.3–15.9)
RBC.: 2.38 MIL/uL — ABNORMAL LOW (ref 3.87–5.11)
Retic Count, Absolute: 86.2 10*3/uL (ref 19.0–186.0)
Retic Ct Pct: 3.6 % — ABNORMAL HIGH (ref 0.4–3.1)

## 2018-08-18 LAB — VITAMIN B12: Vitamin B-12: 774 pg/mL (ref 180–914)

## 2018-08-18 LAB — ACID FAST CULTURE WITH REFLEXED SENSITIVITIES (MYCOBACTERIA): Acid Fast Culture: NEGATIVE

## 2018-08-18 LAB — HIV-1 RNA QUANT-NO REFLEX-BLD
HIV 1 RNA Quant: <20 copies/mL
LOG10 HIV-1 RNA: UNDETERMINED log10copy/mL

## 2018-08-18 MED ORDER — FUROSEMIDE 10 MG/ML IJ SOLN
40.0000 mg | Freq: Once | INTRAMUSCULAR | Status: AC
  Administered 2018-08-18: 40 mg via INTRAVENOUS
  Filled 2018-08-18: qty 4

## 2018-08-18 NOTE — Progress Notes (Addendum)
PROGRESS NOTE  Ceniya Fowers TDV:[id number] DOB: 01-18-1986 DOA: 08/01/2018 PCP: Lajean Manes, MD  Brief summary: Ryenn Graingeris an 33 y.o.femalewith a past medical history significant for AIDS (recent CD4 count was <35 on 06/20/2018),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,questionablesarcoidosis(currently undergoing pulmonary work-up), ASCUS, and anemia.  She was admitted with bilateral lung infiltrates and sepsis.  She was coronavirus 229 e positive.  There was also concern for PCP and she's been on steroids, clindamycin and primaquine.  Hospitalization has been c/b anemia and thrombocytopenia requiring recurrent transfusions.  Heme is following.  Hospitalization also c/b elevated liver enzymes.  GI was consulted.  This is now improving.  At one point in time, there was plan for possible bronchoscopy or VATS, but pt improving from respiratory status at this point in time and plan to hold off for now.  Her anemia and thrombocytopenia continue to be barrier for discharge.    HPI/Recap of past 24 hours:  hgb 7.1, plt 19, denies weakness,  No active bleeding, no chest pain, no sob  Assessment/Plan: Active Problems:   Hyponatremia   AIDS (acquired immune deficiency syndrome) (HCC)   Molluscum contagiosum   Symptomatic anemia   Thrombocytopenia (HCC)   Pulmonary infiltrates   Fever   Sepsis (HCC)   Acute respiratory failure with hypoxemia (HCC)   Chest pain   AKI (acute kidney injury) ([place])   Elevated bilirubin   Acute hypoxic respiratory failure with fever  Bilateral lung infiltrate and sepsis  -  autoimmune and vasculitis panel unremarkable (negative GBM ab, mpo/pr-3 ab, Ro/La ab, scl-70 ab, anti CCP, RF, anti DS DNA, ANA) - normal ACE  - aspergillus ag/serum  pending, fungitell negative, cryptococcal antigen negative, histoplasma antigen negative.  Follow AFB blood cx (sent on 7/1) - negative urine strep and legionella  -COVID 19 negative -NP swab:  +coronavirus (229E) -BC NGTD -bordetella pertussis pcr negative -Parvo IgG + but IgM negative -Pneumocystis smear was not collected  -bronchoscopy on 6/5, biopsy" SCANT LUNG PARENCHYMA WITH FIBROSIS AND REACTIVE CHANGES. - NO GRANULOMATA IDENTIFIED." -concern is for PCP- GG:YIRSWNIOEV and clindamycin as well as prednisone, clinically improving on currently treatment -pulmonology and infectious disease input appreciated -per communication with ID Dr Prince Rome plan to finish pcp treatment in house, then discharge with outpatient ID and pulm follow up, ID recommended lung biopsy at some point if thrombocytopenia is able to improve. -cmv  DNA pcr qualitative +, case discussed with ID Dr Prince Rome who plan to check CMV DNA quantitative, ID will follow up on result  Sinus tachycardia tsh unremarkable Likely reactive, started on lopressor, improving  Anemia/Thrombocytopenia S/p extensive work up including bone marrow biopsy, Etiology unclear  Hematology following Per hematology, transfuse if hgb less than 7 or active bleed transfuse if plt less than 20k or active bleed, check one hr post transfusion plt S/p prbc transfusion x6 S/p plt transfusion x7 Last transfused  on 7/8  Hyponatremia:  Sodium in the 120's on presentation from dehydration vs SIADH,  improved  Hypomagnesemia Replace, repeat, keep mag>2  AKI: Cr 1.23 on presentation, cr normalized  Metabolic acidosis, treated with  sodium bicarb, resolved, off sodium bicarb, monitor  Elevated LFT's  Elevated Bilirubin  Elevated Alk Phos  Jaundice:   [place] with gallbladder sludge with asymmetric wall thickening measuring up to 5 mm. GI consult, appreciate recs -> recommending HIDA scan (negative) Follow ASMA (negative), AMA (negative), CMV (pending),  Intermittent back pain High sensitivity troponin unremarkable, CTA chest/ab negative for dissection , no  large PE No midline TTP on my exam today, seems mostly paraspinal, continue to  monitor  Hyperglycemia -on steroids -now has hypoglycemia , decrease meal time insulin, monitor  Lower extremity edema: resolved with iv lasix , monitor    Sore throat  Concern for thrush: started on nystatin with concern for thrush   HIV/AIDS CD4=10 06/05/2017 On biktarvy ID following     Frequent hospitalizations: Review of records summarized by Dr Maudie Mercury on admission: "Admission 4/28-06/11/2017 tx empirically for PCP pneumonia  Admission 1/31-03/10/2018 Acute respiratory failure secondary to pneumonia (rhinovirus) tx with rocephin/ zithromax, Bactrim for PCP Macrocytic anemia   Admission 4/21-4/24/2020 tx empirically for PCP pneumonia with Bactrim and Prednisone CT chest=>enlarged hilar lymph nodes ? Sarcoidosis   Admission 5/28-07/16/2018 tx w Vanco, cefepime, zithromax for pneumonia Bronchoscopy 6/5=> bordetella bronchiseptica tx with zithromax Hematology consulted for thrombocytopenia (not TTP)  Bone marrow biopsy 5/29 nonspecific HGSIL => needs outpatient follow up"  FTT: she did well PT eval  Code Status: full  Family Communication: patient   Disposition Plan: needs hgb /plt to be stable, need to be transfusion independent, last transfused on 7/8, needs ID clearance,  Per ID Dr Prince Rome , will need finish pcp treatment in house and hopefully cbc will be stable and not transfusion dependent, then will likely need outpatient open lung biopsy  Pulmonology follow up appointment scheduled with Dr Melvyn Novas on 7/24.  Consultants:  Hematology  ID  Pulmonology/critical care  GI  Procedures:  As above  Antibiotics:  As above   Objective: BP 94/63   Pulse (!) 108   Temp 99.3 F (37.4 C) (Oral)   Resp 18   Ht 5' 1"  (1.549 m)   Wt 56.7 kg   SpO2 98%   BMI 23.62 kg/m   Intake/Output Summary (Last 24 hours) at 08/18/2018 1106 Last data filed at 08/18/2018 0600 Gross per 24 hour  Intake 120 ml  Output -  Net 120 ml   Filed Weights   08/01/18 2226  08/02/18 0103  Weight: 56.7 kg 56.7 kg    Exam: Patient is examined daily including today on 08/18/2018, exams remain the same as of yesterday except that has changed    General:  NAD, pleasant  Cardiovascular: RRR  Respiratory: CTABL  Abdomen: Soft/ND/NT, positive BS  Musculoskeletal: trace Edema has almost resolved   Neuro: alert, oriented   Data Reviewed: Basic Metabolic Panel: Recent Labs  Lab 08/13/18 0452 08/14/18 0758 08/15/18 0518 08/16/18 0511 08/17/18 0607 08/18/18 0549  NA 135 135 134* 133* 135 133*  K 4.1 3.9 3.8 3.9 3.8 3.9  CL 102 100 100 101 101 100  CO2 25 23 24 24 26 24   GLUCOSE 128* 103* 112* 91 92 95  BUN 17 15 13 9 9 10   CREATININE 0.52 0.56 0.54 0.49 0.51 0.59  CALCIUM 7.2* 6.9* 6.7* 7.0* 7.2* 7.7*  MG 2.0 1.3* 1.4* 1.6* 1.7  --    Liver Function Tests: Recent Labs  Lab 08/14/18 0758 08/15/18 0518 08/16/18 0511 08/17/18 0607 08/18/18 0549  AST 121* 149* 122* 104* 79*  ALT 71* 81* 84* 81* 73*  ALKPHOS 220* 197* 194* 180* 168*  BILITOT 1.4* 1.4* 1.3* 1.4* 1.4*  PROT 5.8* 5.6* 5.8* 6.0* 6.4*  ALBUMIN 2.0* 1.9* 2.1* 2.1* 2.3*   No results for input(s): LIPASE, AMYLASE in the last 168 hours. No results for input(s): AMMONIA in the last 168 hours. CBC: Recent Labs  Lab 08/14/18 0758 08/15/18 0518 08/16/18 0511 08/17/18 1219 08/18/18  0549  WBC 4.9 4.3 3.8* 3.3* 3.0*  NEUTROABS  --  2.8 2.1 1.7 1.5*  HGB 6.4* 7.6* 7.6* 6.9* 7.1*  HCT 20.1* 23.9* 24.1* 21.6* 22.9*  MCV 93.5 93.0 94.1 94.3 96.2  PLT 24* 17* 15* 15* 19*   Cardiac Enzymes:   No results for input(s): CKTOTAL, CKMB, CKMBINDEX, TROPONINI in the last 168 hours. BNP (last 3 results) Recent Labs    03/08/18 2129 08/02/18 1520  BNP 8.8 144.0*    ProBNP (last 3 results) No results for input(s): PROBNP in the last 8760 hours.  CBG: Recent Labs  Lab 08/17/18 0816 08/17/18 1121 08/17/18 1709 08/17/18 2017 08/18/18 0735  GLUCAP 119* 138* 123* 99 136*     Recent Results (from the past 240 hour(s))  Culture, blood (single)     Status: None   Collection Time: 08/09/18  5:46 PM   Specimen: BLOOD RIGHT HAND  Result Value Ref Range Status   Specimen Description   Final    BLOOD RIGHT HAND Performed at [facility] [address] [place], [place] [address]    Special Requests   Final    BOTTLES DRAWN AEROBIC ONLY Blood Culture results may not be optimal due to an inadequate volume of blood received in culture bottles Performed at [facility] [address], [place], [place] [address]    Culture   Final    NO GROWTH 5 DAYS Performed at [facility], [place] [address] [place], [place] [address]    Report Status 08/14/2018 FINAL  Final     Studies: No results found.  Scheduled Meds: . sodium chloride   Intravenous Once  . B-complex with vitamin C  1 tablet Oral Daily  . bictegravir-emtricitabine-tenofovir AF  1 tablet Oral Daily  . feeding supplement (ENSURE ENLIVE)  237 mL Oral Q24H  . folic acid  2 mg Oral Daily  . guaiFENesin  600 mg Oral BID  . insulin aspart  0-15 Units Subcutaneous TID WC  . insulin aspart  3 Units Subcutaneous TID WC  . lidocaine  2 patch Transdermal Q24H  . mouth rinse  15 mL Mouth Rinse BID  . metoprolol tartrate  50 mg Oral BID  . nystatin  5 mL Oral QID  . pantoprazole  40 mg Oral BID  . polyethylene glycol  17 g Oral BID  . predniSONE  30 mg Oral Q breakfast  . primaquine  30 mg Oral Daily    Continuous Infusions: . clindamycin (CLEOCIN) IV 900 mg (08/18/18 0556)     Time spent: 3mns,  I have personally reviewed and interpreted on  08/18/2018 daily labs,  imagings as discussed above under date review session and assessment and plans.  I reviewed all nursing notes, pharmacy notes, consultant notes,  vitals, pertinent old records  I have discussed plan of care as described above with RN , patient  on 08/18/2018   FFlorencia ReasonsMD, PhD  [facility] Pager [phone] If 7PM-7AM, please contact night-coverage at [email or web address], password [email or web address]7/01/2019, 11:06 AM  LOS: 16 days

## 2018-08-19 LAB — CBC WITH DIFFERENTIAL/PLATELET
Abs Immature Granulocytes: 0.04 10*3/uL (ref 0.00–0.07)
Basophils Absolute: 0 10*3/uL (ref 0.0–0.1)
Basophils Relative: 0 %
Eosinophils Absolute: 0 10*3/uL (ref 0.0–0.5)
Eosinophils Relative: 1 %
HCT: 21.1 % — ABNORMAL LOW (ref 36.0–46.0)
Hemoglobin: 6.6 g/dL — CL (ref 12.0–15.0)
Immature Granulocytes: 1 %
Lymphocytes Relative: 33 %
Lymphs Abs: 1.3 10*3/uL (ref 0.7–4.0)
MCH: 30.6 pg (ref 26.0–34.0)
MCHC: 31.3 g/dL (ref 30.0–36.0)
MCV: 97.7 fL (ref 80.0–100.0)
Monocytes Absolute: 0.6 10*3/uL (ref 0.1–1.0)
Monocytes Relative: 16 %
Neutro Abs: 1.9 10*3/uL (ref 1.7–7.7)
Neutrophils Relative %: 49 %
Platelets: 25 10*3/uL — CL (ref 150–400)
RBC: 2.16 MIL/uL — ABNORMAL LOW (ref 3.87–5.11)
RDW: 18.5 % — ABNORMAL HIGH (ref 11.5–15.5)
WBC: 3.9 10*3/uL — ABNORMAL LOW (ref 4.0–10.5)
nRBC: 1.3 % — ABNORMAL HIGH (ref 0.0–0.2)

## 2018-08-19 LAB — COMPREHENSIVE METABOLIC PANEL
ALT: 62 U/L — ABNORMAL HIGH (ref 0–44)
AST: 67 U/L — ABNORMAL HIGH (ref 15–41)
Albumin: 2.3 g/dL — ABNORMAL LOW (ref 3.5–5.0)
Alkaline Phosphatase: 144 U/L — ABNORMAL HIGH (ref 38–126)
Anion gap: 12 (ref 5–15)
BUN: 10 mg/dL (ref 6–20)
CO2: 20 mmol/L — ABNORMAL LOW (ref 22–32)
Calcium: 7.8 mg/dL — ABNORMAL LOW (ref 8.9–10.3)
Chloride: 102 mmol/L (ref 98–111)
Creatinine, Ser: 0.68 mg/dL (ref 0.44–1.00)
GFR calc Af Amer: >60 mL/min (ref >60)
GFR calc non Af Amer: >60 mL/min (ref >60)
Glucose, Bld: 153 mg/dL — ABNORMAL HIGH (ref 70–99)
Potassium: 3.9 mmol/L (ref 3.5–5.1)
Sodium: 134 mmol/L — ABNORMAL LOW (ref 135–145)
Total Bilirubin: 1.2 mg/dL (ref 0.3–1.2)
Total Protein: 6.2 g/dL — ABNORMAL LOW (ref 6.5–8.1)

## 2018-08-19 LAB — GLUCOSE, CAPILLARY
Glucose-Capillary: 87 mg/dL (ref 70–99)
Glucose-Capillary: 109 mg/dL — ABNORMAL HIGH (ref 70–99)
Glucose-Capillary: 152 mg/dL — ABNORMAL HIGH (ref 70–99)
Glucose-Capillary: 86 mg/dL (ref 70–99)

## 2018-08-19 LAB — MAGNESIUM: Magnesium: 1.3 mg/dL — ABNORMAL LOW (ref 1.7–2.4)

## 2018-08-19 LAB — PREPARE RBC (CROSSMATCH)

## 2018-08-19 MED ORDER — SODIUM CHLORIDE 0.9% IV SOLUTION
Freq: Once | INTRAVENOUS | Status: AC
  Administered 2018-08-19: 13:00:00 via INTRAVENOUS

## 2018-08-19 MED ORDER — SODIUM CHLORIDE 0.9% IV SOLUTION: Freq: Once | INTRAVENOUS | Status: DC

## 2018-08-19 NOTE — Progress Notes (Signed)
PROGRESS NOTE  Krystal Short QBV:[id number] DOB: [birth year] DOA: 08/01/2018 PCP: Lajean Manes, MD  Brief summary: Krystal Graingeris an 33 y.o.femalewith a past medical history significant for AIDS (recent CD4 count was <35 on 06/20/2018),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,questionablesarcoidosis(currently undergoing pulmonary work-up), ASCUS, and anemia.  She was admitted with bilateral lung infiltrates and sepsis.  She was coronavirus 229 e positive.  There was also concern for PCP and she's been on steroids, clindamycin and primaquine.  Hospitalization has been c/b anemia and thrombocytopenia requiring recurrent transfusions.  Heme is following.  Hospitalization also c/b elevated liver enzymes.  GI was consulted.  This is now improving.  At one point in time, there was plan for possible bronchoscopy or VATS, but pt improving from respiratory status at this point in time and plan to hold off for now.  Her anemia and thrombocytopenia continue to be barrier for discharge.    HPI/Recap of past 24 hours:  Hb down 6.6, awaiting pbrc's. No cough, SOB or fevers.   Assessment/Plan: Active Problems:   Hyponatremia   AIDS (acquired immune deficiency syndrome) (HCC)   Molluscum contagiosum   Symptomatic anemia   Thrombocytopenia (HCC)   Pulmonary infiltrates   Fever   Sepsis (HCC)   Acute respiratory failure with hypoxemia (HCC)   Chest pain   AKI (acute kidney injury) (HCC)   Elevated bilirubin   Acute hypoxic respiratory failure with fever  Bilateral lung infiltrate and sepsis: +coronavirus 229E and suspected PJP on empiric Rx as below -  autoimmune and vasculitis panel unremarkable (negative GBM ab, mpo/pr-3 ab, [place] ab, scl-70 ab, anti CCP, RF, anti DS DNA, ANA) - normal ACE  - aspergillus ag/serum  pending, fungitell negative, cryptococcal antigen negative, histoplasma antigen negative.  Follow AFB blood cx (sent on 7/1) - negative urine strep and  legionella  -COVID 19 negative -NP swab: +coronavirus (229E) -BC NGTD -bordetella pertussis pcr negative -Parvo IgG + but IgM negative -Pneumocystis smear was not collected  -bronchoscopy on 6/5, biopsy" SCANT LUNG PARENCHYMA WITH FIBROSIS AND REACTIVE CHANGES. - NO GRANULOMATA IDENTIFIED." -concern is for PJP- on empiric primaquine po and clindamycin IV as well as prednisone 30 qd, clinically improving on current treatment, much better - IV access becoming a bit difficult, consider 3-lumen vs PICC depending on expected LOS -pulmonology and infectious disease input appreciated -per communication with ID Dr Prince Rome plan to finish pcp treatment in house, then discharge with outpatient ID and pulm follow up - ID recommended lung biopsy at some point if thrombocytopenia is able to improve. -cmv  DNA pcr qualitative +, case discussed with ID Dr Prince Rome who plan to check CMV DNA quantitative, ID will follow up on result  Sinus tachycardia tsh unremarkable Likely reactive, started on lopressor, improving  Anemia/Thrombocytopenia S/p extensive work up including bone marrow biopsy, Etiology unclear  Hematology following Per hematology, transfuse if hgb less than 7 or active bleed transfuse if plt less than 20k or active bleed, check one hr post transfusion plt S/p prbc transfusion x6 S/p plt transfusion x7 Last transfused  on 7/8 Prbc's 2 units ordered for today  Hyponatremia:  Sodium in the 120's on presentation from dehydration vs SIADH,  Improved 134  Hypomagnesemia Replace, repeat, keep mag>2 Low Mg today 1.3 > 2gm IV bolus MgSO4  AKI: Cr 1.23 on presentation, cr normalized  Metabolic acidosis, treated with  sodium bicarb, resolved, off sodium bicarb, monitor  Elevated LFT's  Elevated Bilirubin  Elevated Alk Phos  Jaundice:  [place] with gallbladder sludge with asymmetric wall thickening measuring up to 5 mm. GI consult, appreciate recs -> recommending HIDA scan (negative)   ASMA (negative), AMA (negative), CMV (pending)  Intermittent back pain High sensitivity troponin unremarkable, CTA chest/ab negative for dissection , no large PE No midline TTP on my exam today, seems mostly paraspinal, continue to monitor  Hyperglycemia -on steroids -now has hypoglycemia , decrease meal time insulin, monitor  Lower extremity edema: resolved with iv lasix , monitor    Sore throat  Concern for thrush: started on nystatin with concern for thrush   HIV/AIDS CD4=10 06/05/2017 On biktarvy ID following   Frequent hospitalizations: Review of records summarized by Dr Maudie Mercury on admission: "Admission 4/28-06/11/2017 tx empirically for PCP pneumonia  Admission 1/31-03/10/2018 Acute respiratory failure secondary to pneumonia (rhinovirus) tx with rocephin/ zithromax, Bactrim for PCP Macrocytic anemia   Admission 4/21-4/24/2020 tx empirically for PCP pneumonia with Bactrim and Prednisone CT chest=>enlarged hilar lymph nodes ? Sarcoidosis   Admission 5/28-07/16/2018 tx w Vanco, cefepime, zithromax for pneumonia Bronchoscopy 6/5=> bordetella bronchiseptica tx with zithromax Hematology consulted for thrombocytopenia (not TTP)  Bone marrow biopsy 5/29 nonspecific HGSIL => needs outpatient follow up"  FTT: she did well w/ PT eval  Code Status: full  Family Communication: patient   Disposition Plan: needs hgb /plt to be stable, need to be transfusion independent, needs ID clearance.  Per ID Dr Prince Rome , will need finish pcp treatment in house and hopefully cbc will be stable, then will likely need outpatient open lung biopsy     Kelly Splinter MD  pgr [phone] 08/19/2018, 12:38 PM     Pulmonology follow up appointment scheduled with Dr Melvyn Novas on 7/24.  Consultants:  Hematology  ID  Pulmonology/critical care  GI  Procedures:  As above  Antibiotics:  As above   Objective: BP 96/66 (BP Location: Right Arm)   Pulse (!) 109   Temp 99 F (37.2 C)  (Oral)   Resp 16   Ht 5' 1"  (1.549 m)   Wt 56.7 kg   SpO2 99%   BMI 23.62 kg/m   Intake/Output Summary (Last 24 hours) at 08/19/2018 1230 Last data filed at 08/19/2018 1210 Gross per 24 hour  Intake 347.1 ml  Output 1450 ml  Net -1102.9 ml   Filed Weights   08/01/18 2226 08/02/18 0103  Weight: 56.7 kg 56.7 kg    Exam: Patient is examined daily including today on 08/19/2018, exams remain the same as of yesterday except that has changed    General:  NAD, pleasant  Cardiovascular: RRR  Respiratory: CTABL  Abdomen: Soft/ND/NT, positive BS  Musculoskeletal: trace Edema has almost resolved   Neuro: alert, oriented   Data Reviewed: Basic Metabolic Panel: Recent Labs  Lab 08/14/18 0758 08/15/18 0518 08/16/18 0511 08/17/18 0607 08/18/18 0549 08/19/18 0500  NA 135 134* 133* 135 133* 134*  K 3.9 3.8 3.9 3.8 3.9 3.9  CL 100 100 101 101 100 102  CO2 23 24 24 26 24  20*  GLUCOSE 103* 112* 91 92 95 153*  BUN 15 13 9 9 10 10   CREATININE 0.56 0.54 0.49 0.51 0.59 0.68  CALCIUM 6.9* 6.7* 7.0* 7.2* 7.7* 7.8*  MG 1.3* 1.4* 1.6* 1.7  --  1.3*   Liver Function Tests: Recent Labs  Lab 08/15/18 0518 08/16/18 0511 08/17/18 0607 08/18/18 0549 08/19/18 0500  AST 149* 122* 104* 79* 67*  ALT 81* 84* 81* 73* 62*  ALKPHOS 197* 194* 180* 168* 144*  BILITOT  1.4* 1.3* 1.4* 1.4* 1.2  PROT 5.6* 5.8* 6.0* 6.4* 6.2*  ALBUMIN 1.9* 2.1* 2.1* 2.3* 2.3*   No results for input(s): LIPASE, AMYLASE in the last 168 hours. No results for input(s): AMMONIA in the last 168 hours. CBC: Recent Labs  Lab 08/15/18 0518 08/16/18 0511 08/17/18 0607 08/18/18 0549 08/19/18 0500  WBC 4.3 3.8* 3.3* 3.0* 3.9*  NEUTROABS 2.8 2.1 1.7 1.5* 1.9  HGB 7.6* 7.6* 6.9* 7.1* 6.6*  HCT 23.9* 24.1* 21.6* 22.9* 21.1*  MCV 93.0 94.1 94.3 96.2 97.7  PLT 17* 15* 15* 19* 25*   Cardiac Enzymes:   No results for input(s): CKTOTAL, CKMB, CKMBINDEX, TROPONINI in the last 168 hours. BNP (last 3 results) Recent  Labs    03/08/18 2129 08/02/18 1520  BNP 8.8 144.0*    ProBNP (last 3 results) No results for input(s): PROBNP in the last 8760 hours.  CBG: Recent Labs  Lab 08/18/18 1230 08/18/18 1736 08/18/18 2208 08/19/18 0841 08/19/18 1208  GLUCAP 195* 93 123* 87 109*    Recent Results (from the past 240 hour(s))  Culture, blood (single)     Status: None   Collection Time: 08/09/18  5:46 PM   Specimen: BLOOD RIGHT HAND  Result Value Ref Range Status   Specimen Description   Final    BLOOD RIGHT HAND Performed at [facility] [address], [place], [place] [address]    Special Requests   Final    BOTTLES DRAWN AEROBIC ONLY Blood Culture results may not be optimal due to an inadequate volume of blood received in culture bottles Performed at [facility] [address], [place], [place] [address]    Culture   Final    NO GROWTH 5 DAYS Performed at [facility], [place] [address], [place], [place] [address]    Report Status 08/14/2018 FINAL  Final     Studies: No results found.  Scheduled Meds: . sodium chloride   Intravenous Once  . sodium chloride   Intravenous Once  . B-complex with vitamin C  1 tablet Oral Daily  . bictegravir-emtricitabine-tenofovir AF  1 tablet Oral Daily  . feeding supplement (ENSURE ENLIVE)  237 mL Oral Q24H  . folic acid  2 mg Oral Daily  . guaiFENesin  600 mg Oral BID  . insulin aspart  0-15 Units Subcutaneous TID WC  . insulin aspart  3 Units Subcutaneous TID WC  . lidocaine  2 patch Transdermal Q24H  . mouth rinse  15 mL Mouth Rinse BID  . metoprolol tartrate  50 mg Oral BID  . nystatin  5 mL Oral QID  . pantoprazole  40 mg Oral BID  . polyethylene glycol  17 g Oral BID  . predniSONE  30 mg Oral Q breakfast  . primaquine  30 mg Oral Daily    Continuous Infusions: . clindamycin (CLEOCIN) IV 900 mg (08/19/18 0541)     [facility] Pager [phone]. If 7PM-7AM, please contact  night-coverage at [email or web address], password [facility] 08/19/2018, 12:30 PM  LOS: 17 days

## 2018-08-19 NOTE — Progress Notes (Signed)
CRITICAL VALUE ALERT  Critical Value:  hgb 6.6, plts 25  Date & Time Notied:  08/19/18  Provider Notified: Hilbert Bible, NP  Orders Received/Actions taken: Awaiting new orders.

## 2018-08-19 NOTE — Progress Notes (Signed)
PT Cancellation Note  Patient Details Name: Krystal Short MRN: [id number] DOB: 02-Aug-1985   Cancelled Treatment:     per chart review and labs will need to hold off PT today.     Rica Koyanagi  PTA Acute  Rehabilitation Services Pager      [phone] Office      [phone]

## 2018-08-20 ENCOUNTER — Ambulatory Visit (HOSPITAL_BASED_OUTPATIENT_CLINIC_OR_DEPARTMENT_OTHER): Admit: 2018-08-20 | Payer: 59 | Admitting: Obstetrics & Gynecology

## 2018-08-20 ENCOUNTER — Encounter (HOSPITAL_BASED_OUTPATIENT_CLINIC_OR_DEPARTMENT_OTHER): Payer: Self-pay

## 2018-08-20 DIAGNOSIS — M549 Dorsalgia, unspecified: Secondary | ICD-10-CM

## 2018-08-20 DIAGNOSIS — Z8709 Personal history of other diseases of the respiratory system: Secondary | ICD-10-CM

## 2018-08-20 DIAGNOSIS — R093 Abnormal sputum: Secondary | ICD-10-CM

## 2018-08-20 DIAGNOSIS — Z9889 Other specified postprocedural states: Secondary | ICD-10-CM

## 2018-08-20 LAB — BPAM RBC
Blood Product Expiration Date: 202007152359
Blood Product Expiration Date: 202008112359
ISSUE DATE / TIME: 202007131404
ISSUE DATE / TIME: 202007131721
Unit Type and Rh: 5100
Unit Type and Rh: 5100

## 2018-08-20 LAB — ASPERGILLUS ANTIGEN, BAL/SERUM: Aspergillus Ag, BAL/Serum: 0.04 Index (ref 0.00–0.49)

## 2018-08-20 LAB — CBC
HCT: 32.3 % — ABNORMAL LOW (ref 36.0–46.0)
Hemoglobin: 10.7 g/dL — ABNORMAL LOW (ref 12.0–15.0)
MCH: 30.2 pg (ref 26.0–34.0)
MCHC: 33.1 g/dL (ref 30.0–36.0)
MCV: 91.2 fL (ref 80.0–100.0)
Platelets: 20 10*3/uL — CL (ref 150–400)
RBC: 3.54 MIL/uL — ABNORMAL LOW (ref 3.87–5.11)
RDW: 18.9 % — ABNORMAL HIGH (ref 11.5–15.5)
WBC: 4.1 10*3/uL (ref 4.0–10.5)
nRBC: 1 % — ABNORMAL HIGH (ref 0.0–0.2)

## 2018-08-20 LAB — TYPE AND SCREEN
ABO/RH(D): O POS
Antibody Screen: NEGATIVE
Unit division: 0
Unit division: 0

## 2018-08-20 LAB — GLUCOSE, CAPILLARY
Glucose-Capillary: 97 mg/dL (ref 70–99)
Glucose-Capillary: 233 mg/dL — ABNORMAL HIGH (ref 70–99)
Glucose-Capillary: 200 mg/dL — ABNORMAL HIGH (ref 70–99)
Glucose-Capillary: 120 mg/dL — ABNORMAL HIGH (ref 70–99)

## 2018-08-20 SURGERY — CONE BIOPSY, CERVIX
Anesthesia: Choice

## 2018-08-20 NOTE — Progress Notes (Signed)
Physical Therapy Treatment Patient Details Name: Krystal Short MRN: [id number] DOB: 01/08/1986 Today's Date: 08/20/2018    History of Present Illness Krystal Short is an 33 y.o. female  with a past medical history significant for AIDS (recent CD4 count was <35 on 06/20/2018),  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, questionable sarcoidosis (currently undergoing pulmonary work-up), ASCUS, and anemia.She was admitted with bilateral lung infiltrates and sepsis.    PT Comments    Pt mobilizing well with PT this session, walking hallway distance without difficulty. Pt appears to be at baseline level of functioning, states she mobilizes in hallway daily independently. PT to sign off, no further acute PT needs and pt at [facility].    Follow Up Recommendations  No PT follow up     Equipment Recommendations  None recommended by PT    Recommendations for Other Services       Precautions / Restrictions Precautions Precautions: None Restrictions Weight Bearing Restrictions: No    Mobility  Bed Mobility Overal bed mobility: Modified Independent             General bed mobility comments: use of bedrails to come to sitting  Transfers Overall transfer level: Independent   Transfers: Sit to/from Stand Sit to Stand: Independent         General transfer comment: WFL form and time to complete transfer  Ambulation/Gait Ambulation/Gait assistance: Modified independent (Device/Increase time) Gait Distance (Feet): 425 Feet Assistive device: None;IV Pole Gait Pattern/deviations: Step-through pattern;Wide base of support Gait velocity: WFL   General Gait Details: pt walks with outtoeing, otherwise WFL gait parameters and speed. Pt pushing IV pole but not relying on it for stability .   Stairs Stairs: (deferred, pt states she can do them no problem at home)           Wheelchair Mobility    Modified Rankin (Stroke Patients Only)       Balance Overall  balance assessment: Needs assistance Sitting-balance support: Feet supported Sitting balance-Leahy Scale: Normal       Standing balance-Leahy Scale: Normal                              Cognition Arousal/Alertness: Awake/alert Behavior During Therapy: WFL for tasks assessed/performed Overall Cognitive Status: Within Functional Limits for tasks assessed                                        Exercises      General Comments        Pertinent Vitals/Pain Pain Assessment: No/denies pain    Home Living                      Prior Function            PT Goals (current goals can now be found in the care plan section) Acute Rehab PT Goals Patient Stated Goal: To get stronger PT Goal Formulation: With patient Time For Goal Achievement: 08/22/18 Potential to Achieve Goals: Good Progress towards PT goals: Goals met/education completed, patient discharged from PT    Frequency    Min 3X/week      PT Plan Current plan remains appropriate    Co-evaluation              AM-PAC PT "6 Clicks" Mobility   Outcome Measure  Help needed  turning from your back to your side while in a flat bed without using bedrails?: None Help needed moving from lying on your back to sitting on the side of a flat bed without using bedrails?: None Help needed moving to and from a bed to a chair (including a wheelchair)?: None Help needed standing up from a chair using your arms (e.g., wheelchair or bedside chair)?: None Help needed to walk in hospital room?: None Help needed climbing 3-5 steps with a railing? : None 6 Click Score: 24    End of Session   Activity Tolerance: Patient tolerated treatment well Patient left: in chair;with call bell/phone within reach Nurse Communication: Mobility status PT Visit Diagnosis: Muscle weakness (generalized) (M62.81);Other abnormalities of gait and mobility (R26.89)     Time: 2831-5176 PT Time Calculation  (min) (ACUTE ONLY): 12 min  Charges:  $Gait Training: 8-22 mins                    Julien Girt, PT Acute Rehabilitation Services Pager [phone]  Office [phone]   Yacob Wilkerson D [name] 08/20/2018, 1:19 PM

## 2018-08-20 NOTE — Progress Notes (Signed)
Patient ID: Krystal Short, female   DOB: 08/13/1985, 33 y.o.   MRN: [id number]         [facility]  Date of Admission:  08/01/2018           Day 19 clindamycin        Day 19 primaquine         ASSESSMENT: Krystal Short has had 5 admissions with acute respiratory failure and pneumonitis since her diagnosis with HIV infection late last year.  She is also developed progressive anemia and thrombocytopenia.  On previous admission she has improved promptly on empiric antibiotics and steroids.  This admission occurred while still on prednisone 40 mg daily.  Unfortunately [facility] does not have any sputum specimen for pneumocystis that supposedly was submitted on 08/02/2018.  She has never had a confirmed diagnosis of pneumocystis and I do not think that pneumocystis explains her recurrent pneumonitis and certainly does not explain her cytopenias.  Unfortunately her recent bone marrow biopsy, bronchoscopy and lung biopsy were nondiagnostic. Blood work for possible autoimmune vasculitic causes were mostly unrevealing.  Qualitative CMV PCR was positive from last week. She remains transfusion dependent throughout her hospitalization thus far.  PLAN: 1. Continue clindamycin and primaquine for 21 day course (tentative d/c date is 08/22/2018 with PM doses) for presumed PCP, a condition she has now been treated empirically for on her past 3 admissions without any confirmatory testing. 2. Continue Biktarvy 1 tab daily. HIV viremia remains fully suppressed at < 20 copies as has been the case for the past 8 months. 3. Still requiring PRN blood transfusions (either PRBCs or platelets) every 2-3 days, so doubt she would be stable for d/c home until this stabilizes or etiology is discovered (hem/onc currently concerned her primquine is contributing but preferred PCP treatment of bactrim would be more myelosuppressive than primaquine) 4. F/u quantitative CMV-PCR in blood (sent Sunday) after qualitative  CMV-PCR was positive last week. Unclear if this is playing a causal role vs. acting as a commensal infection in this immunosuppressed patient. Will defer treatment with valcyte until result known as this medication can be myelosuppressive as well.  Principal Problem:   Suspected pneumocystis pneumonia ([place]) Active Problems:   Hyponatremia   AIDS (acquired immune deficiency syndrome) (HCC)   Molluscum contagiosum   Symptomatic anemia   Thrombocytopenia (HCC)   Pulmonary infiltrates   Fever   Sepsis (HCC)   Acute respiratory failure with hypoxemia (HCC)   Chest pain   AKI (acute kidney injury) (HCC)   Elevated bilirubin   Scheduled Meds: . sodium chloride   Intravenous Once  . sodium chloride   Intravenous Once  . B-complex with vitamin C  1 tablet Oral Daily  . bictegravir-emtricitabine-tenofovir AF  1 tablet Oral Daily  . feeding supplement (ENSURE ENLIVE)  237 mL Oral Q24H  . folic acid  2 mg Oral Daily  . guaiFENesin  600 mg Oral BID  . insulin aspart  0-15 Units Subcutaneous TID WC  . insulin aspart  3 Units Subcutaneous TID WC  . lidocaine  2 patch Transdermal Q24H  . mouth rinse  15 mL Mouth Rinse BID  . metoprolol tartrate  50 mg Oral BID  . nystatin  5 mL Oral QID  . pantoprazole  40 mg Oral BID  . polyethylene glycol  17 g Oral BID  . predniSONE  30 mg Oral Q breakfast  . primaquine  30 mg Oral Daily   Continuous Infusions: . clindamycin (CLEOCIN) IV  900 mg (08/20/18 0517)   PRN Meds:.benzonatate, morphine injection, nitroGLYCERIN, ondansetron (ZOFRAN) IV   SUBJECTIVE: Feeling better after receiving 2 units of PRBCs yesterday. Appetite improved and sleep schedule has returned more towards normal wakefullness during daytime hours in recent days. Ambulation limited to within the room due to her isolation from molluscum contagiosum, but she denies dizziness when standing.  Review of Systems: Review of Systems  Constitutional: Positive for malaise/fatigue.  Negative for chills, diaphoresis and fever.  Respiratory: Positive for cough, sputum production and shortness of breath.   Cardiovascular: Negative for chest pain.  Musculoskeletal: Positive for back pain.    Allergies  Allergen Reactions  . Heparin Other (See Comments)    Unknown reaction    OBJECTIVE: Vitals:   08/19/18 1742 08/19/18 2053 08/20/18 0448 08/20/18 0832  BP: (!) 92/57 1_0  Pulse: 98 85 92 72  Resp: _1 Temp: 98 F (36.7 C) 98.3 F (36.8 C) 98.4 F (36.9 C)   TempSrc: Oral Oral Oral   SpO2: 100% 99% 99%   Weight:      Height:       Body mass index is 23.62 kg/m.  Physical Exam Gen: pleasant, NAD, A&Ox 3 Head: NCAT, no temporal wasting evident EENT: PERRL, EOMI, MMM, adequate dentition Neck: supple, no JVD CV: NRRR, no murmurs evident Pulm: CTA bilaterally, no wheeze or retractions Abd: soft, NTND, +BS Extrems: trace LE edema, 2+ pulses Skin: multiple skin lesions to face, neck, and upper chest c/w molluscum contagiosum, adequate skin turgor Neuro: CN II-XII grossly intact, no focal neurologic deficits appreciated, gait was not assessed, A&Ox 3  Lab Results Lab Results  Component Value Date   WBC 4.1 08/20/2018   HGB 10.7 (L) 08/20/2018   HCT 32.3 (L) 08/20/2018   MCV 91.2 08/20/2018   PLT 20 (LL) 08/20/2018    Lab Results  Component Value Date   CREATININE 0.68 08/19/2018   BUN 10 08/19/2018   NA 134 (L) 08/19/2018   K 3.9 08/19/2018   CL 102 08/19/2018   CO2 20 (L) 08/19/2018    Lab Results  Component Value Date   ALT 62 (H) 08/19/2018   AST 67 (H) 08/19/2018   ALKPHOS 144 (H) 08/19/2018   BILITOT 1.2 08/19/2018     Microbiology: Recent Results (from the past 240 hour(s))  Aspergillus Ag, BAL/Serum     Status: None   Collection Time: 08/15/18  2:22 PM   Specimen: Vein; Blood  Result Value Ref Range Status   Aspergillus Ag, BAL/Serum 0.04 0.00 - 0.49 Index Final    Comment: (NOTE) Performed At: [facility] [place], [place] [id number] Rush Farmer MD [id number] Performed At: [facility] [address] [place], [place] [id number] Katina Degree MDPhD [id number]     Janine Ores, MD [facility] [phone] pager   [phone] cell 08/20/2018, 12:09 PM

## 2018-08-20 NOTE — Progress Notes (Signed)
PROGRESS NOTE  Ysela Hettinger JQB:[id number] DOB: 1985-04-19 DOA: 08/01/2018 PCP: Lajean Manes, MD  Brief summary: Manaal Graingeris an 33 y.o.femalewith a past medical history significant for AIDS (recent CD4 count was <35 on 06/20/2018),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,questionablesarcoidosis(currently undergoing pulmonary work-up), ASCUS, and anemia.  She was admitted with bilateral lung infiltrates and sepsis.  She was coronavirus 229 e positive.  There was also concern for PCP and she was started on steroids, clindamycin and primaquine.  Hospitalization c/b anemia and thrombocytopenia requiring recurrent transfusions.  Heme is following.  Hospitalization also c/b elevated liver enzymes.  GI was consulted.  This is now improving.  At one point in time, there was plan for possible bronchoscopy or VATS, but pt since improving from respiratory status the decision was made to hold off for now.  Her anemia and thrombocytopenia continue to be barrier for discharge.    HPI/Recap of past 24 hours:  Hb up to 10.7 after 2u prbc's yesterday .  No cough, SOB or fevers.  Platelets 20k , down slightly.  Per patient she spoke w/ ID yest and plan is for 21d of PCP Rx, today is around d#19.    Assessment/Plan: Principal Problem:   Suspected pneumocystis pneumonia (HCC) Active Problems:   Symptomatic anemia   Thrombocytopenia (HCC)   Pulmonary infiltrates   Sepsis (HCC)   Acute respiratory failure with hypoxemia (HCC)   Hyponatremia   AIDS (acquired immune deficiency syndrome) (HCC)   Chest pain   AKI (acute kidney injury) (HCC)   Elevated bilirubin   Fever   Molluscum contagiosum   Acute hypoxic respiratory failure with fever  Bilateral lung infiltrate and sepsis: +coronavirus 229E and suspected PJP on empiric Rx as below -  autoimmune and vasculitis panel unremarkable (negative GBM ab, mpo/pr-3 ab, [place] ab, scl-70 ab, anti CCP, RF, anti DS DNA, ANA) - normal  ACE  - aspergillus ag/serum  pending, fungitell negative, cryptococcal antigen negative, histoplasma antigen negative.  Follow AFB blood cx (sent on 7/1) - negative urine strep and legionella  -COVID 19 negative -NP swab: +coronavirus (229E) -BC NGTD -bordetella pertussis pcr negative -Parvo IgG + but IgM negative -Pneumocystis smear was not collected  -bronchoscopy on 6/5, biopsy" SCANT LUNG PARENCHYMA WITH FIBROSIS AND REACTIVE CHANGES. - NO GRANULOMATA IDENTIFIED." -concern is for PJP- on empiric primaquine po and clindamycin IV as well as prednisone 30 qd, clinically improving on current treatment, much better - IV access becoming a bit difficult, consider 3-lumen vs PICC depending on expected LOS -pulmonology and infectious disease input appreciated -per ID Dr Prince Rome plan is to finish pcp treatment in house, then discharge with outpatient ID and pulm follow up - ID recommended lung biopsy at some point if thrombocytopenia is able to improve. -cmv  DNA pcr qualitative +, case discussed with ID Dr Prince Rome who plan to check CMV DNA quantitative, ID will follow up on result  Sinus tachycardia tsh unremarkable Likely reactive, would not use BB for sinus tach > will dc lopressor  Anemia/Thrombocytopenia S/p extensive work up including bone marrow biopsy, Etiology unclear  Hematology following Per hematology, transfuse if hgb less than 7 or active bleed transfuse if plt less than 20k or active bleed, check one hr post transfusion plt S/p prbc transfusion x 8 S/p plt transfusion x7 Last transfused  on 7/13 Hb 10 today  Hyponatremia:  Sodium in the 120's on presentation from dehydration vs SIADH,  Improved 134  Hypomagnesemia Replace, repeat, keep mag>2  AKI:  Cr 1.23 on presentation, cr normalized  Metabolic acidosis, treated with  sodium bicarb, resolved, off sodium bicarb, monitor  Elevated LFT's  Elevated Bilirubin  Elevated Alk Phos  Jaundice:   [place] with gallbladder  sludge with asymmetric wall thickening measuring up to 5 mm. GI consult, appreciate recs -> recommending HIDA scan (negative)  ASMA (negative), AMA (negative), CMV (pending)  Intermittent back pain High sensitivity troponin unremarkable, CTA chest/ab negative for dissection , no large PE No midline TTP on my exam today, seems mostly paraspinal, continue to monitor  Hyperglycemia -on steroids -now has hypoglycemia , decrease meal time insulin, monitor  Lower extremity edema: resolved with iv lasix , monitor    Sore throat  Concern for thrush: started on nystatin with concern for thrush   HIV/AIDS CD4=10 06/05/2017 On biktarvy ID following   Frequent hospitalizations: Review of records summarized by Dr Maudie Mercury on admission: "Admission 4/28-06/11/2017 tx empirically for PCP pneumonia  Admission 1/31-03/10/2018 Acute respiratory failure secondary to pneumonia (rhinovirus) tx with rocephin/ zithromax, Bactrim for PCP Macrocytic anemia   Admission 4/21-4/24/2020 tx empirically for PCP pneumonia with Bactrim and Prednisone CT chest=>enlarged hilar lymph nodes ? Sarcoidosis   Admission 5/28-07/16/2018 tx w Vanco, cefepime, zithromax for pneumonia Bronchoscopy 6/5=> bordetella bronchiseptica tx with zithromax Hematology consulted for thrombocytopenia (not TTP)  Bone marrow biopsy 5/29 nonspecific HGSIL => needs outpatient follow up"  FTT: she did well w/ PT eval   Code Status: full  Family Communication: patient   Disposition Plan: needs hgb /plt to be stable, need to be transfusion independent, needs ID clearance.  Per ID Dr Prince Rome , will need to finish pcp treatment in house and then will likely need outpatient open lung biopsy     Kelly Splinter MD  pgr [phone] 08/20/2018, 9:28 AM     Pulmonology follow up appointment scheduled with Dr Melvyn Novas on 7/24.  Consultants:  Hematology  ID  Pulmonology/critical care  GI  Procedures:  As above  Antibiotics:  As  above   Objective: BP 93/69   Pulse 72   Temp 98.4 F (36.9 C) (Oral)   Resp 16   Ht _0  (1.549 m)   Wt 56.7 kg   SpO2 99%   BMI 23.62 kg/m   Intake/Output Summary (Last 24 hours) at 08/20/2018 0928 Last data filed at 08/20/2018 0740 Gross per 24 hour  Intake 700 ml  Output 1100 ml  Net -400 ml   Filed Weights   08/01/18 2226 08/02/18 0103  Weight: 56.7 kg 56.7 kg    Exam: Patient is examined daily including today on 08/20/2018, exams remain the same as of yesterday except that has changed    General:  NAD, pleasant  Cardiovascular: RRR  Respiratory: CTABL  Abdomen: Soft/ND/NT, positive BS  Musculoskeletal: trace Edema has almost resolved   Neuro: alert, oriented   Data Reviewed: Basic Metabolic Panel: Recent Labs  Lab 08/14/18 0758 08/15/18 0518 08/16/18 0511 08/17/18 0607 08/18/18 0549 08/19/18 0500  NA 135 134* 133* 135 133* 134*  K 3.9 3.8 3.9 3.8 3.9 3.9  CL 100 100 101 101 100 102  CO2 _1 20*  GLUCOSE 103* 112* 91 92 95 153*  BUN _2 CREATININE 0.56 0.54 0.49 0.51 0.59 0.68  CALCIUM 6.9* 6.7* 7.0* 7.2* 7.7* 7.8*  MG 1.3* 1.4* 1.6* 1.7  --  1.3*   Liver Function Tests: Recent Labs  Lab 08/15/18 0518 08/16/18 0511 08/17/18 8177 08/18/18 0549  08/19/18 0500  AST 149* 122* 104* 79* 67*  ALT 81* 84* 81* 73* 62*  ALKPHOS 197* 194* 180* 168* 144*  BILITOT 1.4* 1.3* 1.4* 1.4* 1.2  PROT 5.6* 5.8* 6.0* 6.4* 6.2*  ALBUMIN 1.9* 2.1* 2.1* 2.3* 2.3*   No results for input(s): LIPASE, AMYLASE in the last 168 hours. No results for input(s): AMMONIA in the last 168 hours. CBC: Recent Labs  Lab 08/15/18 0518 08/16/18 0511 08/17/18 0607 08/18/18 0549 08/19/18 0500 08/20/18 0615  WBC 4.3 3.8* 3.3* 3.0* 3.9* 4.1  NEUTROABS 2.8 2.1 1.7 1.5* 1.9  --   HGB 7.6* 7.6* 6.9* 7.1* 6.6* 10.7*  HCT 23.9* 24.1* 21.6* 22.9* 21.1* 32.3*  MCV 93.0 94.1 94.3 96.2 97.7 91.2  PLT 17* 15* 15* 19* 25* 20*   Cardiac Enzymes:   No  results for input(s): CKTOTAL, CKMB, CKMBINDEX, TROPONINI in the last 168 hours. BNP (last 3 results) Recent Labs    03/08/18 2129 08/02/18 1520  BNP 8.8 144.0*    ProBNP (last 3 results) No results for input(s): PROBNP in the last 8760 hours.  CBG: Recent Labs  Lab 08/19/18 0841 08/19/18 1208 08/19/18 1656 08/19/18 2025 08/20/18 0736  GLUCAP 87 109* 152* 86 97    Recent Results (from the past 240 hour(s))  Aspergillus Ag, BAL/Serum     Status: None   Collection Time: 08/15/18  2:22 PM   Specimen: Vein; Blood  Result Value Ref Range Status   Aspergillus Ag, BAL/Serum 0.04 0.00 - 0.49 Index Final    Comment: (NOTE) Performed At: [facility] [address] [place], [place] [id number] Rush Farmer MD FT:[id number] Performed At: [facility] [address] [place], [place] [id number] Katina Degree MDPhD TD:[id number]      Studies: No results found.  Scheduled Meds: . sodium chloride   Intravenous Once  . sodium chloride   Intravenous Once  . B-complex with vitamin C  1 tablet Oral Daily  . bictegravir-emtricitabine-tenofovir AF  1 tablet Oral Daily  . feeding supplement (ENSURE ENLIVE)  237 mL Oral Q24H  . folic acid  2 mg Oral Daily  . guaiFENesin  600 mg Oral BID  . insulin aspart  0-15 Units Subcutaneous TID WC  . insulin aspart  3 Units Subcutaneous TID WC  . lidocaine  2 patch Transdermal Q24H  . mouth rinse  15 mL Mouth Rinse BID  . metoprolol tartrate  50 mg Oral BID  . nystatin  5 mL Oral QID  . pantoprazole  40 mg Oral BID  . polyethylene glycol  17 g Oral BID  . predniSONE  30 mg Oral Q breakfast  . primaquine  30 mg Oral Daily    Continuous Infusions: . clindamycin (CLEOCIN) IV 900 mg (08/20/18 0626)     [facility] Pager [phone]. If 7PM-7AM, please contact night-coverage at [email or web address], password [facility] 08/20/2018, 9:28 AM  LOS: 18 days

## 2018-08-20 NOTE — Progress Notes (Signed)
Physical Therapy Discharge Patient Details Name: Krystal Short MRN: [id number] DOB: 08/30/1985 Today's Date: 08/20/2018 Time: 7793-9030 PT Time Calculation (min) (ACUTE ONLY): 12 min  Patient discharged from PT services secondary to goals met and no further PT needs identified.  Please see latest therapy progress note for current level of functioning and progress toward goals.    Progress and discharge plan discussed with patient and/or caregiver: Patient/Caregiver agrees with plan  Julien Girt, [place] Pager [phone]  Office [phone]   Roxine Caddy D [name] 08/20/2018, 1:22 PM

## 2018-08-21 DIAGNOSIS — B59 Pneumocystosis: Secondary | ICD-10-CM

## 2018-08-21 DIAGNOSIS — D61818 Other pancytopenia: Secondary | ICD-10-CM

## 2018-08-21 LAB — CMV DNA, QUANTITATIVE, PCR
CMV DNA Quant: 265 IU/mL
Log10 CMV Qn DNA Pl: 2.423 log10 IU/mL

## 2018-08-21 LAB — CBC
HCT: 32 % — ABNORMAL LOW (ref 36.0–46.0)
Hemoglobin: 10.3 g/dL — ABNORMAL LOW (ref 12.0–15.0)
MCH: 29.4 pg (ref 26.0–34.0)
MCHC: 32.2 g/dL (ref 30.0–36.0)
MCV: 91.4 fL (ref 80.0–100.0)
Platelets: 19 10*3/uL — CL (ref 150–400)
RBC: 3.5 MIL/uL — ABNORMAL LOW (ref 3.87–5.11)
RDW: 19.5 % — ABNORMAL HIGH (ref 11.5–15.5)
WBC: 3.5 10*3/uL — ABNORMAL LOW (ref 4.0–10.5)
nRBC: 0 % (ref 0.0–0.2)

## 2018-08-21 LAB — BASIC METABOLIC PANEL
Anion gap: 12 (ref 5–15)
BUN: 11 mg/dL (ref 6–20)
CO2: 20 mmol/L — ABNORMAL LOW (ref 22–32)
Calcium: 7.8 mg/dL — ABNORMAL LOW (ref 8.9–10.3)
Chloride: 100 mmol/L (ref 98–111)
Creatinine, Ser: 0.54 mg/dL (ref 0.44–1.00)
GFR calc Af Amer: >60 mL/min (ref >60)
GFR calc non Af Amer: >60 mL/min (ref >60)
Glucose, Bld: 95 mg/dL (ref 70–99)
Potassium: 3.8 mmol/L (ref 3.5–5.1)
Sodium: 132 mmol/L — ABNORMAL LOW (ref 135–145)

## 2018-08-21 LAB — GLUCOSE, CAPILLARY
Glucose-Capillary: 106 mg/dL — ABNORMAL HIGH (ref 70–99)
Glucose-Capillary: 134 mg/dL — ABNORMAL HIGH (ref 70–99)
Glucose-Capillary: 292 mg/dL — ABNORMAL HIGH (ref 70–99)
Glucose-Capillary: 136 mg/dL — ABNORMAL HIGH (ref 70–99)

## 2018-08-21 MED ORDER — ATOVAQUONE 750 MG/5ML PO SUSP: 1500.0000 mg | Freq: Every day | ORAL | 0 refills | Status: DC

## 2018-08-21 MED ORDER — METOPROLOL TARTRATE 25 MG PO TABS
25.0000 mg | ORAL_TABLET | Freq: Two times a day (BID) | ORAL | Status: DC
  Administered 2018-08-21: 25 mg via ORAL
  Filled 2018-08-21 (×2): qty 1

## 2018-08-21 MED ORDER — BOOST / RESOURCE BREEZE PO LIQD CUSTOM
1.0000 | Freq: Two times a day (BID) | ORAL | Status: DC
  Administered 2018-08-21 (×2): 1 via ORAL

## 2018-08-21 MED FILL — ATOVAQUONE 750 MG/5ML SUSP: 750 | 42 days supply | Qty: 420 | Fill #0

## 2018-08-21 NOTE — Progress Notes (Signed)
Nutrition Follow-up  RD working remotely.   DOCUMENTATION CODES:   Not applicable  INTERVENTION:  - will d/c Ensure Enlive. - will order Boost Breeze BID, each supplement provides 250 kcal and 9 grams of protein. - will order Magic Cup once/day with dinner meals, each supplement provides 290 kcal and 9 grams of protein. - will order daily multivitamin with minerals. - continue to encourage PO intakes.  - weigh patient today.    NUTRITION DIAGNOSIS:   Increased nutrient needs related to chronic illness as evidenced by estimated needs. -ongoing  GOAL:   Patient will meet greater than or equal to 90% of their needs -unmet  MONITOR:   PO intake, Supplement acceptance, Labs, Weight trends  ASSESSMENT:   33 y.o. female with past medical history significant for AIDS ,  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, questionable sarcoidosis , ASCUS, and chronic anemia was admitted to the hospital for further evaluation of cough and dyspnea.  She is now having elevated LFTs  Patient has not been weighed since admission (6/26). RN flow sheet indicates mild edema to BLE. Ensure Enlive ordered once/day and patient has accepted 1 of 5 bottles since RD assessment on 7/9. Will make adjustments to supplements as outlined above. The only documented intakes between 7/9 and today are 75% of breakfast and 0% of lunch and dinner on 7/10.  Patient remains on droplet precautions.  Per notes: - acute hypoxic respiratory failure with fever and bilateral lung infiltrates--COVID-19 negative - sepsis - plan to finish PCP treatment inpatient  - anemia and thrombocytopenia--s/p extensive workup including bone marrow biopsy; improved - hyponatremia thought to be dehydration vs SIADH--improving - hypomagnesemia--improving - AKI - elevated LFTs, elevated bilirubin, elevated Alk Phos, jaundice--HIDA, ASMA, and AMA negative; CMV pending - intermittent back pain - sore throat with concern for  thrush with mycostatin ordered   Medications reviewed; 1 tablet vitamin B-complex with vitamin C, 2 mg folvite/day, sliding scale novolog, 3 units novolog TID, 5 ml mycostatin QID, 40 mg oral protonix BID, 1 packet miralax BID, 30 mg deltasone/day. Labs reviewed; CBG: 106 mg/dl today, Na: 132 mmol/l, Ca: 7.8 mg/dl.     NUTRITION - FOCUSED PHYSICAL EXAM:  unable to complete at this time.   Diet Order:   Diet Order            Diet regular Room service appropriate? Yes; Fluid consistency: Thin  Diet effective now              EDUCATION NEEDS:   No education needs have been identified at this time  Skin:  Skin Assessment: Reviewed RN Assessment  Last BM:  7/13  Height:   Ht Readings from Last 1 Encounters:  08/02/18 5' 1"  (1.549 m)    Weight:   Wt Readings from Last 1 Encounters:  08/02/18 56.7 kg    Ideal Body Weight:  47.7 kg  BMI:  Body mass index is 23.62 kg/m.  Estimated Nutritional Needs:   Kcal:  1700-1900  Protein:  85-95g  Fluid:  1.9L/day      Jarome Matin, MS, RD, LDN, CNSC Inpatient Clinical Dietitian Pager # [phone] After hours/weekend pager # [phone]

## 2018-08-21 NOTE — Progress Notes (Signed)
Patient ID: Krystal Short, female   DOB: [birth year], 33 y.o.   MRN: [id number]         [name]  Date of Admission:  08/01/2018           Day 20 clindamycin        Day 20 primaquine         ASSESSMENT: Krystal Short has had 5 admissions with acute respiratory failure and pneumonitis since her diagnosis with HIV infection in 4/19. Peculiarly, her respiratory issues did not begin until after Christmas last year though.  She has also developed progressive, pancytopenia in recent months, now developing transfusion-dependent anemia and thrombocytopenia.  On previous admissions, she has improved promptly on empiric antibiotics and steroids.  This admission occurred while still on prednisone 40 mg daily, however.  Unfortunately [facility] does not have any sputum specimen for pneumocystis that supposedly was submitted on 08/02/2018.  She has never had a confirmed diagnosis of pneumocystis, and I do not think that pneumocystis explains her recurrent pneumonitis and certainly does not explain her pancytopenia.  Unfortunately, her recent bone marrow biopsy, bronchoscopy and trans-bronchial lung biopsy were all nondiagnostic. Serologic work up for possible autoimmune vasculitic causes were mostly unrevealing as well. She remains transfusion dependent throughout her hospitalization thus far.  PLAN: 1. Continue clindamycin and primaquine for 21 day course (tentative d/c date is 08/22/2018 with PM doses) for presumed PCP, a condition she has now been treated empirically for on her past 3 admissions without any confirmatory testing. Weaning from prednisone has been slower than a standard 21 day PCP taper due to hematology's concerns for her idiopathic pancytopenia. She currently is receiving 30 mg of prednisone daily. Will defer taper regimen to hematology, hoping they recognize that prolonged prednisone treatment will impair her ability to develop adequate CD4 reconstitution. Her peak CD4 count was  70, 6 % in 8/19 but declined to <35, 3 % as of 06/2018). 2. Continue Biktarvy 1 tab daily. HIV viremia remains fully suppressed at < 20 copies as has been the case for the past 8 months. 3. Still requiring PRN blood transfusions (either PRBCs or platelets) every 2-3 days, so doubt she would be stable for d/c home until this stabilizes or etiology is discovered (hem/onc currently concerned her primquine is contributing but preferred PCP treatment of bactrim would be more myelosuppressive than primaquine). Also, unclear if there was worry for hemolysis on dapsone, so will have to re-initiate PCP prophylaxis with mepron 1500 mg daily beginning Friday, following completion of empiric PCP treatment course.  4. Quantitative CMV-PCR from blood was only 265 IU, more consistent with role as a commensal infection in this immunosuppressed patient rather than a primary/causal role in her pancytopenia and pulmonary process. In either of the latter 2 scenarios, CMV-PCR would be expected to exceed 10,000 IU, typically. Will defer treatment with valcyte as this medication can be myelosuppressive as well. Recommend re-consulting hematology as platelet count has failed to improve and we need a tapering schedule for her steroids as CD4 reconstitution will remain impaired the longer she remains on these medications. No clear cause of IRIS or OI to explain why she would need steroids from an ID standpoint. Similarly, expanded serologic work up for infectious causes of disseminated infection has been unrevealing. She has had negative cryptococcal serum Ag, Aspergillus galactomannan, fungitell, routine blood cxs, and her AFB blood cx remains negative (confirmed with micro lab this afternoon) from this admission as well at this time. 5. Given repeated  admissions for respiratory complaints with negative BAL studies and transbronchial biopsy that failed to show a cause for her illnesses, recommend proceeding with an open lung biopsy as an  OP as it is ill-advised to leave this patient on chronic steroids without a confirmed diagnosis that would warrant such treatment. Discussed recommendations with hospitalist this afternoon.  Principal Problem:   Suspected pneumocystis pneumonia ([place]) Active Problems:   Hyponatremia   AIDS (acquired immune deficiency syndrome) (HCC)   Molluscum contagiosum   Symptomatic anemia   Thrombocytopenia (HCC)   Pulmonary infiltrates   Fever   Sepsis (HCC)   Acute respiratory failure with hypoxemia (HCC)   Chest pain   AKI (acute kidney injury) (HCC)   Elevated bilirubin   Pancytopenia, acquired ([place])   Scheduled Meds: . B-complex with vitamin C  1 tablet Oral Daily  . bictegravir-emtricitabine-tenofovir AF  1 tablet Oral Daily  . feeding supplement  1 Container Oral BID BM  . folic acid  2 mg Oral Daily  . guaiFENesin  600 mg Oral BID  . insulin aspart  0-15 Units Subcutaneous TID WC  . insulin aspart  3 Units Subcutaneous TID WC  . lidocaine  2 patch Transdermal Q24H  . mouth rinse  15 mL Mouth Rinse BID  . metoprolol tartrate  25 mg Oral BID  . nystatin  5 mL Oral QID  . pantoprazole  40 mg Oral BID  . polyethylene glycol  17 g Oral BID  . predniSONE  30 mg Oral Q breakfast  . primaquine  30 mg Oral Daily   Continuous Infusions: . clindamycin (CLEOCIN) IV 900 mg (08/21/18 1334)   PRN Meds:.benzonatate, morphine injection, nitroGLYCERIN, ondansetron (ZOFRAN) IV   SUBJECTIVE: Feeling better after receiving 2 units of PRBCs 1 days ago. Appetite improved and sleep schedule has returned more towards normal wakefullness during daytime hours in recent days. No bleeding evident from any sites  Review of Systems: Review of Systems  Constitutional: Positive for malaise/fatigue. Negative for chills, diaphoresis and fever.  Respiratory: Positive for cough, sputum production and shortness of breath.   Cardiovascular: Negative for chest pain.  Musculoskeletal: Positive for back pain.     Allergies  Allergen Reactions  . Heparin Other (See Comments)    Unknown reaction    OBJECTIVE: Vitals:   08/20/18 1428 08/20/18 2012 08/21/18 0615 08/21/18 1153  BP: 97/67 1_0  Pulse: 99 97 95 (!) 107  Resp: _1 Temp: 98 F (36.7 C) 98.4 F (36.9 C) 99.4 F (37.4 C) 99.6 F (37.6 C)  TempSrc: Oral Oral Oral Oral  SpO2: 100% 100% 97% 99%  Weight:      Height:       Body mass index is 23.62 kg/m.  Physical Exam Gen: pleasant, NAD, A&Ox 3 Head: NCAT, no temporal wasting evident EENT: PERRL, EOMI, MMM, adequate dentition Neck: supple, no JVD CV: NRRR, no murmurs evident Pulm: CTA bilaterally, no wheeze or retractions Abd: soft, NTND, +BS Extrems: trace LE edema, 2+ pulses Skin: multiple skin lesions to face, neck, and upper chest c/w molluscum contagiosum, adequate skin turgor Neuro: CN II-XII grossly intact, no focal neurologic deficits appreciated, gait was not assessed, A&Ox 3  Lab Results Lab Results  Component Value Date   WBC 3.5 (L) 08/21/2018   HGB 10.3 (L) 08/21/2018   HCT 32.0 (L) 08/21/2018   MCV 91.4 08/21/2018   PLT 19 (LL) 08/21/2018    Lab Results  Component Value Date  CREATININE 0.54 08/21/2018   BUN 11 08/21/2018   NA 132 (L) 08/21/2018   K 3.8 08/21/2018   CL 100 08/21/2018   CO2 20 (L) 08/21/2018    Lab Results  Component Value Date   ALT 62 (H) 08/19/2018   AST 67 (H) 08/19/2018   ALKPHOS 144 (H) 08/19/2018   BILITOT 1.2 08/19/2018     Microbiology: Recent Results (from the past 240 hour(s))  Aspergillus Ag, BAL/Serum     Status: None   Collection Time: 08/15/18  2:22 PM   Specimen: Vein; Blood  Result Value Ref Range Status   Aspergillus Ag, BAL/Serum 0.04 0.00 - 0.49 Index Final    Comment: (NOTE) Performed At: [facility] [address] [place], [place] [id number] Rush Farmer MD JJ:[id number] Performed At: [facility] [address] [place], [place] [id number] Katina Degree MDPhD  FT:[id number]     Janine Ores, [place] [facility] [place] [facility] [phone] pager   [phone] cell 08/21/2018, 2:37 PM

## 2018-08-21 NOTE — Plan of Care (Signed)

## 2018-08-21 NOTE — Progress Notes (Addendum)
PROGRESS NOTE    Krystal Short  ELF:[id number] DOB: [birth year] DOA: 08/01/2018 PCP: Krystal Manes, MD  Brief Narrative:  Krystal Short an 33 y.o.femalewith a past medical history significant for AIDS (recent CD4 count was <35 on 06/20/2018),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,questionablesarcoidosis(currently undergoing pulmonary work-up), ASCUS, and anemia.  She was admitted with bilateral lung infiltrates and sepsis. She was coronavirus 229 e positive. There was also concern for PCP and she was started on steroids, clindamycin and primaquine. Hospitalization c/b anemia and thrombocytopenia requiring recurrent transfusions. Heme is following. Hospitalization also c/b elevated liver enzymes. GI was consulted. This is now improving. At one point in time, there was plan for possible bronchoscopy or VATS, but pt since improving from respiratory status the decision was made to hold off for now. Her anemia and thrombocytopenia continue to be barrier for discharge.  Assessment & Plan:   Principal Problem:   Suspected pneumocystis pneumonia (Krystal Short) Active Problems:   Hyponatremia   AIDS (acquired immune deficiency syndrome) (HCC)   Molluscum contagiosum   Symptomatic anemia   Thrombocytopenia (HCC)   Pulmonary infiltrates   Fever   Sepsis (HCC)   Acute respiratory failure with hypoxemia (HCC)   Chest pain   AKI (acute kidney injury) (HCC)   Elevated bilirubin   Acute hypoxic respiratory failure with fever  Bilateral lung infiltrate and sepsis: +coronavirus 229E and suspected PJP on empiric Rx as below -  autoimmune and vasculitis panel unremarkable (negative GBM ab, mpo/pr-3 ab, Ro/La ab, scl-70 ab, anti CCP, RF, anti DS DNA, ANA) - normal ACE  - aspergillus ag/serum negative, fungitell negative, cryptococcal antigen negative, histoplasma antigen negative. Follow AFB blood cx (sent on 7/1 - pending) - negative urine strep and legionella  -COVID 19  negative -NP swab: +coronavirus (229E) -BC NGTD -bordetella pertussis pcr negative -Parvo IgG + but IgM negative -Pneumocystis smear was not collected  -bronchoscopy on 6/5, biopsy" SCANT LUNG PARENCHYMA WITH FIBROSIS AND REACTIVE CHANGES. - NO GRANULOMATA IDENTIFIED." -concern is for PJP- on empiric primaquine po and clindamycin IV as well asprednisone 30 qd, clinically improving on current treatment, much better - IV access becoming a bit difficult, consider 3-lumen vs PICC depending on expected LOS -pulmonology and infectious disease input appreciated -per ID Dr Krystal Short plan is to finish pcp treatment in house, then discharge with outpatient ID and pulm follow up - ID recommended lung biopsy at some point if thrombocytopenia is able to improve. -cmv  DNA pcr qualitative +, case discussed with ID Dr Krystal Short who plan to check CMV DNA quantitative (265), ID will follow up on result   Sinus tachycardia tsh unremarkable Likely reactive, started on metop earlier in admission > will taper lopressor - HR improved  Anemia/Thrombocytopenia S/p extensive work up including bone marrow biopsy, Etiology unclear  Hematology following Per hematology, transfuse if hgb less than 7 or active bleed transfuse if plt less than 20k or active bleed, check one hr post transfusion plt S/p prbc transfusion x 8 S/p plt transfusion x7 Will need stable counts prior to being able to discharge, consider rediscussion with heme  Hyponatremia:  Sodium in the 120's on presentation from dehydration vs SIADH,  Improved 134  Hypomagnesemia Replace, repeat, keep mag>2  AKI: Cr 1.23 on presentation, cr normalized  Metabolic acidosis,treated with  sodium bicarb, resolved, off sodium bicarb, monitor  Elevated LFT's  Elevated Bilirubin  Elevated Alk Phos  Jaundice:  [place] with gallbladder sludge with asymmetric wall thickening measuring up to 5 mm. GI  consult, appreciate recs -> recommendingHIDA scan  (negative)  ASMA(negative), AMA(negative), CMV(pending)  Intermittent back pain High sensitivity troponin unremarkable, CTA chest/ab negative for dissection , no large PE No midline TTP on my exam today, seems mostly paraspinal, continue to monitor  Hyperglycemia -on steroids -now has hypoglycemia , decrease meal time insulin, monitor  Lower extremity edema: resolved with iv lasix , monitor   Sore throat  Concern for thrush: started on nystatin with concern for thrush  HIV/AIDS CD4=10 06/05/2017 On biktarvy ID following  Frequent hospitalizations: Review of recordssummarized by Dr Krystal Short on admission: "Admission 4/28-06/11/2017 tx empirically for PCP pneumonia  Admission 1/31-03/10/2018 Acute respiratory failure secondary to pneumonia (rhinovirus) tx with rocephin/ zithromax, Bactrim for PCP Macrocytic anemia   Admission 4/21-4/24/2020 tx empirically for PCP pneumonia with Bactrim and Prednisone CT chest=>enlarged hilar lymph nodes ? Sarcoidosis   Admission 5/28-07/16/2018 tx w Vanco, cefepime, zithromax for pneumonia Bronchoscopy 6/5=> bordetella bronchiseptica tx with zithromax Hematology consulted for thrombocytopenia (not TTP)  Bone marrow biopsy 5/29 nonspecific HGSIL => needs outpatient follow up"  FTT: she did well w/ PT eval   DVT prophylaxis: SCD Code Status: full  Family Communication: none at bedside Disposition Plan: pending clearance by ID - per previous hospitalist, pt will need to complete PCP treatment in house and then outpatient open lung bx.  Also needs to be transfusion independent.    Consultants:   ID  pulm  Heme  GI  Procedures:  Echo IMPRESSIONS    1. The left ventricle has normal systolic function, with an ejection fraction of 55-60%. The cavity size was normal. Indeterminate diastolic filling due to E-A fusion. No evidence of left ventricular regional wall motion abnormalities.  2. The right ventricle has normal  systolic function. The cavity was normal. There is no increase in right ventricular wall thickness.  3. Small pericardial effusion.  4. No evidence of mitral valve stenosis. Trivial mitral regurgitation.  5. The aortic valve is tricuspid. No stenosis of the aortic valve.  6. The aortic root is normal in size and structure.  7. The IVC was normal in size. No complete TR doppler jet so unable to estimate PA systolic pressure.  Antimicrobials:  Anti-infectives (From admission, onward)   Start     Dose/Rate Route Frequency Ordered Stop   08/04/18 1000  ceFEPIme (MAXIPIME) 2 g in sodium chloride 0.9 % 100 mL IVPB  Status:  Discontinued     2 g 200 mL/hr over 30 Minutes Intravenous Every 8 hours 08/04/18 0944 08/06/18 1028   08/03/18 0300  vancomycin (VANCOCIN) IVPB 1000 mg/200 mL premix  Status:  Discontinued     1,000 mg 200 mL/hr over 60 Minutes Intravenous Daily 08/02/18 0202 08/02/18 1527   08/02/18 1630  primaquine tablet 30 mg     30 mg Oral Daily 08/02/18 1527     08/02/18 1530  clindamycin (CLEOCIN) IVPB 900 mg     900 mg 100 mL/hr over 30 Minutes Intravenous Every 8 hours 08/02/18 1527     08/02/18 1000  ceFEPIme (MAXIPIME) 2 g in sodium chloride 0.9 % 100 mL IVPB  Status:  Discontinued     2 g 200 mL/hr over 30 Minutes Intravenous Every 12 hours 08/02/18 0202 08/04/18 0944   08/02/18 1000  azithromycin (ZITHROMAX) tablet 250 mg    Note to Pharmacy: Zpack taper as directed     250 mg Oral Daily 08/02/18 0302 08/03/18 1018   08/02/18 1000  bictegravir-emtricitabine-tenofovir AF (BIKTARVY) 50-200-25 MG per tablet  1 tablet     1 tablet Oral Daily 08/02/18 0302     08/01/18 2300  vancomycin (VANCOCIN) 1,250 mg in sodium chloride 0.9 % 250 mL IVPB     1,250 mg 166.7 mL/hr over 90 Minutes Intravenous  Once 08/01/18 2242 08/02/18 0309   08/01/18 2245  ceFEPIme (MAXIPIME) 2 g in sodium chloride 0.9 % 100 mL IVPB     2 g 200 mL/hr over 30 Minutes Intravenous NOW 08/01/18 2241 08/02/18  0058     Subjective: No complaints today. No CP or SOB.  Objective: Vitals:   08/20/18 0832 08/20/18 1428 08/20/18 2012 08/21/18 0615  BP: 93/69 97/67 102/65 97/61  Pulse: 72 99 97 95  Resp:  _0 Temp:  98 F (36.7 C) 98.4 F (36.9 C) 99.4 F (37.4 C)  TempSrc:  Oral Oral Oral  SpO2:  100% 100% 97%  Weight:      Height:        Intake/Output Summary (Last 24 hours) at 08/21/2018 1141 Last data filed at 08/21/2018 0615 Gross per 24 hour  Intake 260 ml  Output 1400 ml  Net -1140 ml   Filed Weights   08/01/18 2226 08/02/18 0103  Weight: 56.7 kg 56.7 kg    Examination:  General exam: Appears calm and comfortable  Respiratory system: Clear to auscultation. Respiratory effort normal. Cardiovascular system: S1 & S2 heard, RRR. Gastrointestinal system: Abdomen is nondistended, soft and nontender. Central nervous system: Alert and oriented. No focal neurological deficits. Extremities: no LEE Skin: No rashes, lesions or ulcers Psychiatry: Judgement and insight appear normal. Mood & affect appropriate.     Data Reviewed: I have personally reviewed following labs and imaging studies  CBC: Recent Labs  Lab 08/15/18 0518 08/16/18 0511 08/17/18 0607 08/18/18 0549 08/19/18 0500 08/20/18 0615 08/21/18 0526  WBC 4.3 3.8* 3.3* 3.0* 3.9* 4.1 3.5*  NEUTROABS 2.8 2.1 1.7 1.5* 1.9  --   --   HGB 7.6* 7.6* 6.9* 7.1* 6.6* 10.7* 10.3*  HCT 23.9* 24.1* 21.6* 22.9* 21.1* 32.3* 32.0*  MCV 93.0 94.1 94.3 96.2 97.7 91.2 91.4  PLT 17* 15* 15* 19* 25* 20* 19*   Basic Metabolic Panel: Recent Labs  Lab 08/15/18 0518 08/16/18 0511 08/17/18 0607 08/18/18 0549 08/19/18 0500 08/21/18 0726  NA 134* 133* 135 133* 134* 132*  K 3.8 3.9 3.8 3.9 3.9 3.8  CL 100 101 101 100 102 100  CO2 _1 20* 20*  GLUCOSE 112* 91 92 95 153* 95  BUN _2 CREATININE 0.54 0.49 0.51 0.59 0.68 0.54  CALCIUM 6.7* 7.0* 7.2* 7.7* 7.8* 7.8*  MG 1.4* 1.6* 1.7  --  1.3*  --     GFR: Estimated Creatinine Clearance: 76.9 mL/min (by C-G formula based on SCr of 0.54 mg/dL). Liver Function Tests: Recent Labs  Lab 08/15/18 0518 08/16/18 0511 08/17/18 0607 08/18/18 0549 08/19/18 0500  AST 149* 122* 104* 79* 67*  ALT 81* 84* 81* 73* 62*  ALKPHOS 197* 194* 180* 168* 144*  BILITOT 1.4* 1.3* 1.4* 1.4* 1.2  PROT 5.6* 5.8* 6.0* 6.4* 6.2*  ALBUMIN 1.9* 2.1* 2.1* 2.3* 2.3*   No results for input(s): LIPASE, AMYLASE in the last 168 hours. No results for input(s): AMMONIA in the last 168 hours. Coagulation Profile: No results for input(s): INR, PROTIME in the last 168 hours. Cardiac Enzymes: No results for input(s): CKTOTAL, CKMB, CKMBINDEX, TROPONINI in the last 168 hours. BNP (last 3  results) No results for input(s): PROBNP in the last 8760 hours. HbA1C: No results for input(s): HGBA1C in the last 72 hours. CBG: Recent Labs  Lab 08/20/18 0736 08/20/18 1130 08/20/18 1646 08/20/18 2007 08/21/18 0806  GLUCAP 97 233* 200* 120* 106*   Lipid Profile: No results for input(s): CHOL, HDL, LDLCALC, TRIG, CHOLHDL, LDLDIRECT in the last 72 hours. Thyroid Function Tests: No results for input(s): TSH, T4TOTAL, FREET4, T3FREE, THYROIDAB in the last 72 hours. Anemia Panel: No results for input(s): VITAMINB12, FOLATE, FERRITIN, TIBC, IRON, RETICCTPCT in the last 72 hours. Sepsis Labs: No results for input(s): PROCALCITON, LATICACIDVEN in the last 168 hours.  Recent Results (from the past 240 hour(s))  Aspergillus Ag, BAL/Serum     Status: None   Collection Time: 08/15/18  2:22 PM   Specimen: Vein; Blood  Result Value Ref Range Status   Aspergillus Ag, BAL/Serum 0.04 0.00 - 0.49 Index Final    Comment: (NOTE) Performed At: [facility] [address] [place], [place] [id number] Rush Farmer MD WU:[id number] Performed At: [facility] [address] [place], [place] [id number] Katina Degree MDPhD [id number]          Radiology Studies: No  results found.      Scheduled Meds: . B-complex with vitamin C  1 tablet Oral Daily  . bictegravir-emtricitabine-tenofovir AF  1 tablet Oral Daily  . feeding supplement  1 Container Oral BID BM  . folic acid  2 mg Oral Daily  . guaiFENesin  600 mg Oral BID  . insulin aspart  0-15 Units Subcutaneous TID WC  . insulin aspart  3 Units Subcutaneous TID WC  . lidocaine  2 patch Transdermal Q24H  . mouth rinse  15 mL Mouth Rinse BID  . metoprolol tartrate  50 mg Oral BID  . nystatin  5 mL Oral QID  . pantoprazole  40 mg Oral BID  . polyethylene glycol  17 g Oral BID  . predniSONE  30 mg Oral Q breakfast  . primaquine  30 mg Oral Daily   Continuous Infusions: . clindamycin (CLEOCIN) IV 900 mg (08/21/18 0605)     LOS: 19 days    Time spent: over 30 min    Fayrene Helper, MD [facility] Pager AMION  If 7PM-7AM, please contact night-coverage [email or web address] Password [facility] 08/21/2018, 11:41 AM

## 2018-08-22 ENCOUNTER — Other Ambulatory Visit: Payer: Self-pay | Admitting: Primary Care

## 2018-08-22 DIAGNOSIS — R531 Weakness: Secondary | ICD-10-CM

## 2018-08-22 DIAGNOSIS — F329 Major depressive disorder, single episode, unspecified: Secondary | ICD-10-CM

## 2018-08-22 LAB — CBC
HCT: 32.4 % — ABNORMAL LOW (ref 36.0–46.0)
Hemoglobin: 10.1 g/dL — ABNORMAL LOW (ref 12.0–15.0)
MCH: 29.2 pg (ref 26.0–34.0)
MCHC: 31.2 g/dL (ref 30.0–36.0)
MCV: 93.6 fL (ref 80.0–100.0)
Platelets: 25 10*3/uL — CL (ref 150–400)
RBC: 3.46 MIL/uL — ABNORMAL LOW (ref 3.87–5.11)
RDW: 19.9 % — ABNORMAL HIGH (ref 11.5–15.5)
WBC: 3.7 10*3/uL — ABNORMAL LOW (ref 4.0–10.5)
nRBC: 0 % (ref 0.0–0.2)

## 2018-08-22 LAB — COMPREHENSIVE METABOLIC PANEL
ALT: 53 U/L — ABNORMAL HIGH (ref 0–44)
AST: 55 U/L — ABNORMAL HIGH (ref 15–41)
Albumin: 2.6 g/dL — ABNORMAL LOW (ref 3.5–5.0)
Alkaline Phosphatase: 162 U/L — ABNORMAL HIGH (ref 38–126)
Anion gap: 11 (ref 5–15)
BUN: 13 mg/dL (ref 6–20)
CO2: 19 mmol/L — ABNORMAL LOW (ref 22–32)
Calcium: 7.6 mg/dL — ABNORMAL LOW (ref 8.9–10.3)
Chloride: 100 mmol/L (ref 98–111)
Creatinine, Ser: 0.62 mg/dL (ref 0.44–1.00)
GFR calc Af Amer: >60 mL/min (ref >60)
GFR calc non Af Amer: >60 mL/min (ref >60)
Glucose, Bld: 102 mg/dL — ABNORMAL HIGH (ref 70–99)
Potassium: 3.8 mmol/L (ref 3.5–5.1)
Sodium: 130 mmol/L — ABNORMAL LOW (ref 135–145)
Total Bilirubin: 1.6 mg/dL — ABNORMAL HIGH (ref 0.3–1.2)
Total Protein: 6.9 g/dL (ref 6.5–8.1)

## 2018-08-22 LAB — GLUCOSE, CAPILLARY
Glucose-Capillary: 95 mg/dL (ref 70–99)
Glucose-Capillary: 206 mg/dL — ABNORMAL HIGH (ref 70–99)
Glucose-Capillary: 211 mg/dL — ABNORMAL HIGH (ref 70–99)

## 2018-08-22 LAB — MAGNESIUM: Magnesium: 1.3 mg/dL — ABNORMAL LOW (ref 1.7–2.4)

## 2018-08-22 LAB — IMMATURE PLATELET FRACTION: Immature Platelet Fraction: 16 % — ABNORMAL HIGH (ref 1.2–8.6)

## 2018-08-22 MED ORDER — ATOVAQUONE 750 MG/5ML PO SUSP: 1500.0000 mg | Freq: Every day | ORAL | 0 refills | Status: DC

## 2018-08-22 MED ORDER — B COMPLEX-C PO TABS: 1.0000 | ORAL_TABLET | Freq: Every day | ORAL | 0 refills | Status: AC

## 2018-08-22 MED ORDER — MAGNESIUM SULFATE 4 GM/100ML IV SOLN
4.0000 g | Freq: Once | INTRAVENOUS | Status: AC
  Administered 2018-08-22: 09:00:00 4 g via INTRAVENOUS
  Filled 2018-08-22: qty 100

## 2018-08-22 MED ORDER — FOLIC ACID 1 MG PO TABS: 2.0000 mg | ORAL_TABLET | Freq: Every day | ORAL | 0 refills | Status: AC

## 2018-08-22 MED ORDER — PREDNISONE 10 MG PO TABS: 30.0000 mg | ORAL_TABLET | Freq: Every day | ORAL | 0 refills | Status: DC

## 2018-08-22 MED FILL — predniSONE 10 MG TABS: 10 | 14 days supply | Qty: 42 | Fill #0

## 2018-08-22 MED FILL — FOLIC ACID 1 MG TABS: 1 | 30 days supply | Qty: 60 | Fill #0

## 2018-08-22 NOTE — Progress Notes (Signed)
Patient ID: Krystal Short, female   DOB: 1985-08-11, 33 y.o.   MRN: [id number]         [place] for Infectious Disease  Date of Admission:  08/01/2018           Day 20 clindamycin        Day 20 primaquine         ASSESSMENT: Krystal Short has had 5 admissions with acute respiratory failure and pneumonitis since her diagnosis with HIV infection in 4/19. Peculiarly, her respiratory issues did not begin until after Christmas last year though.  She has also developed progressive, pancytopenia in recent months, now developing transfusion-dependent anemia and thrombocytopenia.  On previous admissions, she has improved promptly on empiric antibiotics and steroids.  This admission occurred while still on prednisone 40 mg daily, however.  Unfortunately [facility] does not have any sputum specimen for pneumocystis that supposedly was submitted on 08/02/2018.  She has never had a confirmed diagnosis of pneumocystis, and I do not think that pneumocystis explains her recurrent pneumonitis and certainly does not explain her pancytopenia as PCP is rarely ever an extrapulmonary infection.  Unfortunately, her recent bone marrow biopsy, bronchoscopy and trans-bronchial lung biopsy were all nondiagnostic. Serologic work up for possible autoimmune vasculitic causes were mostly unrevealing as well but were drawn while the patient was actively receiving steroids. She remains transfusion dependent throughout her hospitalization thus far.  PLAN: 1. Completes 21 day course of empiric clindamycin and primaquine today with PM doses for presumed PCP, a condition she has now been treated empirically for on her past 3 admissions without any confirmatory testing. Weaning from prednisone has been slower than a standard 21 day PCP taper due to hematology's concerns for her idiopathic pancytopenia. She currently is receiving 30 mg of prednisone daily. Will defer taper regimen to hematology, hoping they recognize that prolonged prednisone  treatment will impair her ability to develop adequate CD4 reconstitution. Her peak CD4 count was 70, 6 % in 8/19 but declined to <35, 3 % as of 06/2018). Hospitalist attempted to re-consult their service yesterday, but they deferred this evaluation until her OP visit next week and instructed that she remain on 30 mg of prednisone daily until that time. 2. Continue Biktarvy 1 tab daily. HIV viremia remains fully suppressed at < 20 copies (as of 08/15/2018) as has been the case for the past 8 months. 3. Throughout most of her admission thus far, she has required PRN blood transfusions (either PRBCs or platelets) every 2-3 days, but hem/onc believes this may be due to her primaquine used for empiric PCP tx and expect to rebound once she is off this treatment. Also, they were concerned for hemolysis while she was on dapsone, so will re-initiate PCP prophylaxis with mepron 1500 mg daily beginning Friday, following completion of empiric PCP treatment course.  4. I remain concerned about her advanced pancytopenia and mostly transfusion dependent state, so if a repeat bone marrow biopsy is performed, would advise that in addition to specimen sent for pathology/cytology that adequate sample also be sent for routine, fungal, AFB, and viral cultures (this typically will require ~10 cc total or more as each culture usually needs ~2cc for sample to adequate). Staining for microorganisms on bone marrow often will miss opportunistic infections (with the exception of silver staining for PCP), and thus would be inadequate to exclude this possibility. 5. Quantitative CMV-PCR from blood was only 265 IU, more consistent with role as a commensal infection in this immunosuppressed patient rather than  a primary/causal role in her pancytopenia and pulmonary process. In either of the latter 2 scenarios, CMV-PCR would be expected to exceed 10,000 IU, typically. Will defer treatment with valcyte as this medication can be myelosuppressive as  well. Recommend re-consulting hematology as platelet count has failed to improve and we need a tapering schedule for her steroids as CD4 reconstitution will remain impaired the longer she remains on these medications. No clear cause of IRIS or OI to explain why she would need steroids from an ID standpoint. Similarly, expanded serologic work up for infectious causes of disseminated infection has been unrevealing. She has had negative cryptococcal serum Ag, Aspergillus galactomannan, fungitell, routine blood cxs, and her AFB blood cx remains negative (confirmed with micro lab yesterday afternoon) from this admission as well at this time. 6. Given repeated admissions for respiratory complaints with negative BAL studies and transbronchial biopsy that failed to show a cause for her illnesses, recommend proceeding with an open lung biopsy as an OP as it is ill-advised to leave this patient on chronic steroids without a confirmed diagnosis that would warrant such treatment. Discussed recommendations with hospitalist this afternoon. 7. F/u in [facility] clinic with Terri Piedra on 09/10/2018 at 2 PM > 20 minutes of time spent on d/c planning alone today.  Principal Problem:   Suspected pneumocystis pneumonia (Krystal Short) Active Problems:   Hyponatremia   AIDS (acquired immune deficiency syndrome) (HCC)   Molluscum contagiosum   Symptomatic anemia   Thrombocytopenia (HCC)   Pulmonary infiltrates   Fever   Sepsis (HCC)   Acute respiratory failure with hypoxemia (HCC)   Chest pain   AKI (acute kidney injury) ([place])   Elevated bilirubin   Pancytopenia, acquired ([place])   Scheduled Meds:  B-complex with vitamin C  1 tablet Oral Daily   bictegravir-emtricitabine-tenofovir AF  1 tablet Oral Daily   feeding supplement  1 Container Oral BID BM   folic acid  2 mg Oral Daily   guaiFENesin  600 mg Oral BID   insulin aspart  0-15 Units Subcutaneous TID WC   insulin aspart  3 Units Subcutaneous TID WC   lidocaine  2  patch Transdermal Q24H   mouth rinse  15 mL Mouth Rinse BID   metoprolol tartrate  25 mg Oral BID   nystatin  5 mL Oral QID   pantoprazole  40 mg Oral BID   polyethylene glycol  17 g Oral BID   predniSONE  30 mg Oral Q breakfast   primaquine  30 mg Oral Daily   Continuous Infusions:  clindamycin (CLEOCIN) IV 900 mg (08/22/18 1318)   PRN Meds:.benzonatate, morphine injection, nitroGLYCERIN, ondansetron (ZOFRAN) IV   SUBJECTIVE: No further blood or platelet transfusions needed in past 48 hrs. Appetite has been improving. She in axious to go home after PM dose of clindamycin.  Review of Systems: Review of Systems  Constitutional: Positive for malaise/fatigue. Negative for chills, diaphoresis and fever.  HENT: Negative for ear discharge, ear pain, hearing loss and sore throat.   Eyes: Negative for photophobia and discharge.  Respiratory: Positive for cough. Negative for sputum production and shortness of breath.   Cardiovascular: Negative for chest pain.  Gastrointestinal: Negative for blood in stool, constipation, melena, nausea and vomiting.  Genitourinary: Negative for flank pain, frequency, hematuria and urgency.  Musculoskeletal: Negative for back pain.  Skin: Positive for rash.       Known molluscum, mostly face  Neurological: Positive for weakness. Negative for dizziness, focal weakness and seizures.  Endo/Heme/Allergies:  Negative for polydipsia.  Psychiatric/Behavioral: Positive for depression. Negative for hallucinations, substance abuse and suicidal ideas.    Allergies  Allergen Reactions   Heparin Other (See Comments)    Unknown reaction    OBJECTIVE: Vitals:   08/21/18 2101 08/22/18 0506 08/22/18 1026 08/22/18 1355  BP: _0 95/67  Pulse: 88 100  (!) 110  Resp: _1 Temp: 98 F (36.7 C) 99.5 F (37.5 C)  97.9 F (36.6 C)  TempSrc: Oral Oral  Oral  SpO2: 99% 98%  100%  Weight:      Height:       Body mass index is 23.62  kg/m.  Physical Exam Gen: pleasant, NAD, A&Ox 3 Head: NCAT, no temporal wasting evident EENT: PERRL, EOMI, MMM, adequate dentition Neck: supple, no JVD CV: NRRR, no murmurs evident Pulm: CTA bilaterally, no wheeze or retractions Abd: soft, NTND, +BS Extrems: trace LE edema, 2+ pulses Skin: multiple skin lesions to face, neck, and upper chest c/w molluscum contagiosum, adequate skin turgor Neuro: CN II-XII grossly intact, no focal neurologic deficits appreciated, gait was not assessed, A&Ox 3  Lab Results Lab Results  Component Value Date   WBC 3.7 (L) 08/22/2018   HGB 10.1 (L) 08/22/2018   HCT 32.4 (L) 08/22/2018   MCV 93.6 08/22/2018   PLT 25 (LL) 08/22/2018    Lab Results  Component Value Date   CREATININE 0.62 08/22/2018   BUN 13 08/22/2018   NA 130 (L) 08/22/2018   K 3.8 08/22/2018   CL 100 08/22/2018   CO2 19 (L) 08/22/2018    Lab Results  Component Value Date   ALT 53 (H) 08/22/2018   AST 55 (H) 08/22/2018   ALKPHOS 162 (H) 08/22/2018   BILITOT 1.6 (H) 08/22/2018     Microbiology: Recent Results (from the past 240 hour(s))  Aspergillus Ag, BAL/Serum     Status: None   Collection Time: 08/15/18  2:22 PM   Specimen: Vein; Blood  Result Value Ref Range Status   Aspergillus Ag, BAL/Serum 0.04 0.00 - 0.49 Index Final    Comment: (NOTE) Performed At: [facility] [place], [place] [id number] Rush Farmer MD [id number] Performed At: [facility] [address] [place], [place] [id number] Katina Degree MDPhD [id number]     Janine Ores, MD [facility] for Infectious Disease [facility] [phone] pager   [phone] cell 08/22/2018, 2:03 PM

## 2018-08-22 NOTE — Discharge Summary (Signed)
Physician Discharge Summary  Kahliyah Dick VWP:[id number] DOB: 1985/11/18 DOA: 08/01/2018  PCP: Lajean Manes, MD  Admit date: 08/01/2018 Discharge date: 08/22/2018  Time spent: 40 minutes  Recommendations for Outpatient Follow-up:  1. Follow up outpatient CBC/CMP/magnesium 2. Discharged on prednisone 30 mg daily, per Dr. Irene Limbo, will defer taper to outpatient heme 3. If pt has repeat bone marrow bx, consider sending sample for routine, fungal, AFB, and viral cultures (see ID note) 4. Follow up with pulmonology outpatient - when pt platelets improved, consider lung biopsy given recurrent admissions for respiratory complaints without clear diagnosis 5. Started on mepron for PCP prophylaxis 6. Follow positive CMV outpatient -> holding off on treatment with valcyte as can be myelosuppressive - follow up with ID 7. Pt had sinus tachycardia here, improved on metoprolol.  BP's soft in 37'S systolic prior to discharge, metoprolol d/c'd.  Continue to follow outpatient and adjust meds, work up further as indicated. 8. Follow blood sugars outpatient while on steroids - may need insulin if plan for prolonged course, but fastin ok today, will hold off on starting this at this time 9. Elevated troponin noted while inpatient, thought 2/2 demand ischemia, consider outpatient cards f/u   Discharge Diagnoses:  Principal Problem:   Suspected pneumocystis pneumonia ([place]) Active Problems:   Hyponatremia   AIDS (acquired immune deficiency syndrome) (HCC)   Molluscum contagiosum   Symptomatic anemia   Thrombocytopenia (H[facility])   Pulmonary infiltrates   Fever   Sepsis ([place])   Acute respiratory failure with hypoxemia (HCC)   Chest pain   AKI (acute kidney injury) ([place])   Elevated bilirubin   Pancytopenia, acquired ([place])   Discharge Condition: stable  Diet recommendation: heart healthy  Filed Weights   08/01/18 2226 08/02/18 0103  Weight: 56.7 kg 56.7 kg    History of present illness:  Krystal  Graingeris an 33 y.o.femalewith Quandre Polinski past medical history significant for AIDS (recent CD4 count was <35 on 06/20/2018),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,questionablesarcoidosis(currently undergoing pulmonary work-up), ASCUS, and anemia.  She was admitted with bilateral lung infiltrates and sepsis. She was coronavirus 229 e positive. There was also concern for PCP and shewas startedon steroids, clindamycin and primaquine. Hospitalization c/b anemia and thrombocytopenia requiring recurrent transfusions. Heme is following. Hospitalization also c/b elevated liver enzymes. GI was consulted. This is now improving. At one point in time, there was plan for possible bronchoscopy or VATS, but ptsinceimproving from respiratory status the decision was made tohold off for now. She completed her therapy for PCP inpatient.  Her Hb has been relatively stable for ~3 days after the last transfusion and her last platelet transfusion was on 7/7.  Discussed with heme who was comfortable with outpatient follow up.    See below for additional details.  Hospital Course:  Acute hypoxic respiratory failure with fever  Bilateral lung infiltrate and sepsis: +coronavirus 229E and suspected PJP on empiric Rx as below - autoimmune and vasculitis panel unremarkable (negative GBM ab, mpo/pr-3 ab, Ro/[place] ab, scl-70 ab, anti CCP, RF, anti DS DNA, ANA) - normal ACE  - aspergillus ag/serumnegative,fungitell negative, cryptococcal antigen negative, histoplasma antigen negative. Follow AFB blood cx (sent on 7/1 - pending) - negative urine strep and legionella  -COVID 19 negative -NP swab: +coronavirus (229E) -BC NGTD -bordetella pertussis pcr negative -Parvo IgG + but IgM negative -Pneumocystis smear was not collected  -bronchoscopy on 6/5, biopsy" SCANT LUNG PARENCHYMA WITH FIBROSIS AND REACTIVE CHANGES. - NO GRANULOMATA IDENTIFIED." -concern is for PJP- on empiric primaquine  po and  clindamycin IV as well asprednisone 30 qd, pt completed this in house -pulmonology and infectious disease input appreciated - ID recommended lung biopsy at some point if thrombocytopenia is able to improve given repeated admissions for respiratory complaints (see 7/16 note) -cmv DNA pcr qualitative +, CMV DNA quantitative (265), which per ID is more c/w commensal infection than Eddie Koc primary/causal role - treatment deferred with valcyte given possibility for this medication to be myelosuppressive    Sinus tachycardia tsh unremarkable Likely reactive,started on metop earlier in admission with improvement.  Pt BP soft.  Will discontinue this at discharge.  Continue outpatient follow up.  Anemia/Thrombocytopenia S/p extensive work up including bone marrow biopsy, Etiology unclear  Hematology following Per hematology, transfuse if hgb less than 7 or active bleed transfuse if plt less than 20k or active bleed, check one hr post transfusion plt S/p prbc transfusion x8 S/p plt transfusion x7 Discussed with Dr. Irene Limbo from heme prior to discharge.  Noted pt could follow up with heme outpatient.  Recommended continuing at current steroid dose at this time.  Pt last pRBC transfusion was on 7/13 and Hb relatively stable.  Last platelet transfusion 7/7.  Will plan to follow outpatient.  Hyponatremia:  Sodium in the 120's on presentation from dehydration vs SIADH,  130's today.  Follow outpatient  Hypomagnesemia Replace, repeat, keep mag>2 Follow outpatient  AKI: Cr 1.23 on presentation, cr normalized  Metabolic acidosis,treated with sodium bicarb, resolved, off sodium bicarb, monitor (now with mild NAGMA - continue to monitor outpatient)  Elevated LFT's  Elevated Bilirubin  Elevated Alk Phos  Jaundice:  [place] with gallbladder sludge with asymmetric wall thickening measuring up to 5 mm. GI consult, appreciate recs -> recommendingHIDA scan (negative) ASMA(negative),  AMA(negative),CMV(as above)  Intermittent back pain High sensitivity troponin unremarkable, CTA chest/ab negative for dissection , no large PE No midline TTP on my exam today, seems mostly paraspinal, continue to monitor  Hyperglycemia -on steroids - A1c 5.5 on 7/3 - suspect this will improve as steroids are tapered.  Appropriate fasting BG this AM, but fluctuating Gaia Gullikson bit.  Will hold off on insulin at this point in time.  Follow outpatient.  Taper steroids.  Lower extremity edema: resolved with iv lasix , monitor   Sore throat  Concern for thrush: started on nystatin with concern for thrush  HIV/AIDS CD4=10 06/05/2017 On biktarvy ID following  Frequent hospitalizations: Review of recordssummarized by Dr Maudie Mercury on admission: "Admission 4/28-06/11/2017 tx empirically for PCP pneumonia  Admission 1/31-03/10/2018 Acute respiratory failure secondary to pneumonia (rhinovirus) tx with rocephin/ zithromax, Bactrim for PCP Macrocytic anemia   Admission 4/21-4/24/2020 tx empirically for PCP pneumonia with Bactrim and Prednisone CT chest=>enlarged hilar lymph nodes ? Sarcoidosis   Admission 5/28-07/16/2018 tx w Vanco, cefepime, zithromax for pneumonia Bronchoscopy 6/5=> bordetella bronchiseptica tx with zithromax Hematology consulted for thrombocytopenia (not TTP)  Bone marrow biopsy 5/29 nonspecific HGSIL => needs outpatient follow up"  FTT:she did well w/ PT eval   Procedures: Echo IMPRESSIONS   1. The left ventricle has normal systolic function, with an ejection fraction of 55-60%. The cavity size was normal. Indeterminate diastolic filling due to E-Keidrick Murty fusion. No evidence of left ventricular regional wall motion abnormalities. 2. The right ventricle has normal systolic function. The cavity was normal. There is no increase in right ventricular wall thickness. 3. Small pericardial effusion. 4. No evidence of mitral valve stenosis. Trivial mitral  regurgitation. 5. The aortic valve is tricuspid. No stenosis of the aortic valve.  6. The aortic root is normal in size and structure. 7. The IVC was normal in size. No complete TR doppler jet so unable to estimate PA systolic pressure.  Consultations:  ID  pulm  Heme  GI  Discharge Exam: Vitals:   08/22/18 1026 08/22/18 1355  BP: 91/70 95/67  Pulse:  (!) 110  Resp:  16  Temp:  97.9 F (36.6 C)  SpO2:  100%   Looking forward to d/c home Discussed d/c plan and importance of outpatient f/u  General: No acute distress. Cardiovascular: Heart sounds show Sathvik Tiedt regular rate, and rhythm. Lungs: Clear to auscultation bilaterally  Abdomen: Soft, nontender, nondistended Neurological: Alert and oriented 3. Moves all extremities 4. Cranial nerves II through XII grossly intact. Skin: Warm and dry. No rashes or lesions. Extremities: No clubbing or cyanosis. No edema.  Discharge Instructions   Discharge Instructions    Diet - low sodium heart healthy   Complete by: As directed    Diet - low sodium heart healthy   Complete by: As directed    Discharge instructions   Complete by: As directed    You were seen for shortness of breath and difficulty breathing.  We treated you empirically for PCP and you also had Elanda Garmany viral infection (coronavirus 229e - this is not the new virus), but based on discussions with ID and pulmonology, it is still Margarita Bobrowski bit unclear what has caused your recurrent admissions with respiratory distress.  Once your platelets improve, it will be Lew Prout good idea to follow up with pulmonology and discuss Mayson Mcneish lung biopsy.    Please follow up with your hematologist for your pancytopenia (low platelets, low red blood cells, and low white blood cells).  You needed many red blood cell and platelet transfusions while you were admitted, which we suspect was related to the acute infection and possibly medications.  You will need to follow up with hematology within 1 week to ensure your  counts are stable or improving.  Please also discuss Anabela Crayton steroid taper with your hematologist (do not abruptly stop your steroids).  We've started you on Sharad Vaneaton new medication for your PCP prophylaxis.  Please start this tomorrow (7/17).  It is called mepron (or atovaquone).  Your blood sugars are still fluctuating with the steroids that you've been on.  We'll discontinue the insulin for you at discharge, but please follow up the taper and your blood sugars with your PCP after you leave the hospital.  Please schedule follow up with infectious disease, your hematologist, your lung doctor (pulmonologist), and your PCP doctor after discharge.  It is extremely important to follow up the labs and workup to this point promptly as an outpatient so that the necessary medication adjustments and next steps can be taken after you leave the hospital.  Return for new, recurrent, or worsening symptoms.  Please ask your PCP to request records from this hospitalization so they know what was done and what the next steps will be.   Increase activity slowly   Complete by: As directed    Increase activity slowly   Complete by: As directed      Allergies as of 08/22/2018      Reactions   Heparin Other (See Comments)   Unknown reaction      Medication List    STOP taking these medications   azithromycin 250 MG tablet Commonly known as: ZITHROMAX   dapsone 100 MG tablet     TAKE these medications   acetaminophen 325  MG tablet Commonly known as: TYLENOL Take 650 mg by mouth every 6 (six) hours as needed for mild pain or headache.   atovaquone 750 MG/5ML suspension Commonly known as: MEPRON Take 10 mLs (1,500 mg total) by mouth daily. Start taking on: August 23, 2018   B-complex with vitamin C tablet Take 1 tablet by mouth daily. Start taking on: August 23, 2018   benzonatate 100 MG capsule Commonly known as: TESSALON Take 100-200 mg by mouth 2 (two) times daily as needed for cough.    bictegravir-emtricitabine-tenofovir AF 50-200-25 MG Tabs tablet Commonly known as: Biktarvy Take 1 tablet by mouth daily.   folic acid 1 MG tablet Commonly known as: FOLVITE Take 2 tablets (2 mg total) by mouth daily. Start taking on: August 23, 2018   ondansetron 4 MG tablet Commonly known as: ZOFRAN Take 1 tablet (4 mg total) by mouth every 6 (six) hours.   predniSONE 10 MG tablet Commonly known as: DELTASONE Take 3 tablets (30 mg total) by mouth daily with breakfast for 14 days. Please follow up with your oncologist as an outpatient for Sasan Wilkie taper (do not stop abruptly) Start taking on: August 23, 2018 What changed:   medication strength  how much to take  how to take this  when to take this  additional instructions      Allergies  Allergen Reactions  . Heparin Other (See Comments)    Unknown reaction   Follow-up Information    Lajean Manes, MD Follow up.   Specialty: Internal Medicine Contact information: [address] [place] [address] [phone]        Tanda Rockers, MD Follow up.   Specialty: Pulmonary Disease Why: Please call for Magdeline Prange follow up appointment Contact information: [place] [address] [phone]        [facility]              Follow up.   Why: hiv management, f/u on cmv test result Please call for Ellanore Vanhook follow up appointment Contact information: [address] [place] [address]       Sindy Guadeloupe, MD Follow up.   Specialty: Oncology Why: Please call for Colbi Schiltz follow up appointment within the next week to discuss your low blood counts and Alwaleed Obeso steroid taper Contact information: [place] [address] [phone]            The results of significant diagnostics from this hospitalization (including imaging, microbiology, ancillary and laboratory) are listed below for reference.    Significant Diagnostic  Studies: Dg Chest 2 View  Result Date: 08/12/2018 CLINICAL DATA:  33 year old female with history of AIDS and concern for pneumonia. EXAM: CHEST - 2 VIEW COMPARISON:  Chest CT dated 08/02/2018 and radiograph dated 08/02/2018 FINDINGS: Overall interval improvement of bilateral reticular densities. No focal consolidation, pleural effusion, or pneumothorax. Top-normal cardiac silhouette. No acute osseous pathology. IMPRESSION: Interval improvement of the reticular pulmonary densities since the prior CT and radiograph. No focal consolidation. Electronically Signed   By: Anner Crete M.D.   On: 08/12/2018 14:12   Nm Hepatobiliary Liver Func  Result Date: 08/10/2018 CLINICAL DATA:  Lower abdominal pain since June 25th.  Nausea. EXAM: NUCLEAR MEDICINE HEPATOBILIARY IMAGING TECHNIQUE: Sequential images of the abdomen were obtained out to 60 minutes following intravenous administration of radiopharmaceutical. RADIOPHARMACEUTICALS:  7.4 mCi Tc-15m Choletec IV COMPARISON:  Ultrasound of 08/08/2018. FINDINGS:  Prompt uptake and biliary excretion of activity by the liver is seen. Gallbladder activity is visualized, consistent with patency of cystic duct. Biliary activity passes into small bowel, consistent with patent common bile duct. IMPRESSION: Normal hepatobiliary study, without evidence of acute cholecystitis. Electronically Signed   By: Abigail Miyamoto M.D.   On: 08/10/2018 16:01   Dg Chest Port 1 View  Result Date: 08/02/2018 CLINICAL DATA:  Pt reported center chest pain and SOB that started Dashonna Chagnon few minutes ago. EXAM: PORTABLE CHEST - 1 VIEW COMPARISON:  none FINDINGS: Worsening bilateral reticulonodular ground-glass opacities. Heart size and mediastinal contours are within normal limits. No effusion. No pneumothorax. Visualized bones unremarkable. IMPRESSION: Continued worsening of bilateral reticulonodular ground-glass opacities. Electronically Signed   By: Lucrezia Europe M.D.   On: 08/02/2018 15:51   Dg Chest  Port 1 View  Result Date: 08/02/2018 CLINICAL DATA:  Fever and hypoxia EXAM: PORTABLE CHEST 1 VIEW COMPARISON:  07/12/2018, 06/18/2018, CT chest 07/05/2018 FINDINGS: Bilateral reticular and ground-glass opacity, increased compared to prior. Normal heart size. No pneumothorax. IMPRESSION: Interval worsening of bilateral reticular and ground-glass infiltrates since radiograph 07/12/2018. Electronically Signed   By: Donavan Foil M.D.   On: 08/02/2018 00:03   Ct Angio Chest/abd/pel For Dissection W And/or W/wo  Result Date: 08/02/2018 CLINICAL DATA:  Back pain EXAM: CT ANGIOGRAPHY CHEST, ABDOMEN AND PELVIS TECHNIQUE: Multidetector CT imaging through the chest, abdomen and pelvis was performed using the standard protocol during bolus administration of intravenous contrast. Multiplanar reconstructed images and MIPs were obtained and reviewed to evaluate the vascular anatomy. CONTRAST:  160m OMNIPAQUE IOHEXOL 350 MG/ML SOLN COMPARISON:  CT chest dated Jul 04, 2018 FINDINGS: CTA CHEST FINDINGS Cardiovascular: Evaluation for pulmonary emboli is limited by respiratory motion artifact. Given this limitation, there is no large centrally located pulmonary embolus. There is no definite dissection. The heart size is normal. There is no large pericardial effusion. The intracardiac blood pool is hypodense relative to the adjacent myocardium consistent with anemia. Mediastinum/Nodes: There are enlarged mediastinal and hilar lymph nodes. For example there is Diamantina Edinger right paratracheal lymph node measuring approximately 1.4 cm. The thyroid gland is unremarkable. There is mild supraclavicular adenopathy. Lungs/Pleura: There are diffuse ground-glass airspace opacities bilaterally with prominent interlobular septal thickening. There are trace bilateral pleural effusions. There is no pneumothorax. The trachea is unremarkable. Musculoskeletal: No chest wall abnormality. No acute or significant osseous findings. Review of the MIP images  confirms the above findings. CTA ABDOMEN AND PELVIS FINDINGS VASCULAR Aorta: Normal caliber aorta without aneurysm, dissection, vasculitis or significant stenosis. Celiac: Patent without evidence of aneurysm, dissection, vasculitis or significant stenosis. SMA: Patent without evidence of aneurysm, dissection, vasculitis or significant stenosis. Renals: Both renal arteries are patent without evidence of aneurysm, dissection, vasculitis, fibromuscular dysplasia or significant stenosis. IMA: Patent without evidence of aneurysm, dissection, vasculitis or significant stenosis. Inflow: Patent without evidence of aneurysm, dissection, vasculitis or significant stenosis. Veins: No obvious venous abnormality within the limitations of this arterial phase study. Review of the MIP images confirms the above findings. NON-VASCULAR Hepatobiliary: There is hepatic steatosis. The gallbladder is unremarkable. Pancreas: Unremarkable. No pancreatic ductal dilatation or surrounding inflammatory changes. Spleen: The spleen is significantly enlarged. Adrenals/Urinary Tract: Adrenal glands are unremarkable. Kidneys are normal, without renal calculi, focal lesion, or hydronephrosis. Bladder is unremarkable. Stomach/Bowel: Stomach is within normal limits. Appendix appears normal. No evidence of bowel wall thickening, distention, or inflammatory changes. Lymphatic: There is scattered prominent retroperitoneal and mesenteric lymph nodes. There are prominent inguinal  lymph nodes. Reproductive: Uterus and bilateral adnexa are unremarkable. Other: There is mild body wall edema. There is Tanyah Debruyne small volume of free fluid in the pelvis. Musculoskeletal: No acute or significant osseous findings. Review of the MIP images confirms the above findings. IMPRESSION: 1. No evidence of Duriel Deery dissection. 2. Evaluation for pulmonary emboli is limited by motion artifact. Given this limitation there is no large centrally located pulmonary embolus. 3. Again noted are  diffuse bilateral ground-glass airspace opacities most likely representing an atypical infectious process. Sarcoidosis can have Muhanad Torosyan similar appearance in the appropriate clinical setting. 4. Trace bilateral pleural effusions. 5. Scattered adenopathy throughout the chest, abdomen, and pelvis, likely related to the patient's underlying history of HIV. 6. Splenomegaly. 7. Hepatic steatosis. 8. Small volume free fluid in the pelvis, likely physiologic. Electronically Signed   By: Constance Holster M.D.   On: 08/02/2018 23:15   US Abdomen Limited Ruq  Result Date: 08/08/2018 CLINICAL DATA:  Elevated bilirubin. EXAM: ULTRASOUND ABDOMEN LIMITED RIGHT UPPER QUADRANT COMPARISON:  CT abdomen pelvis dated July 11, 2018. FINDINGS: Gallbladder: Sludge. No gallstones. Asymmetric wall thickening measuring up to 5 mm. No sonographic Murphy sign noted by sonographer. Common bile duct: Diameter: 3 mm, normal. Liver: No focal lesion identified. Within normal limits in parenchymal echogenicity. Portal vein is patent on color Doppler imaging with normal direction of blood flow towards the liver. Incidental note is made of splenomegaly. IMPRESSION: 1. Gallbladder sludge with asymmetric wall thickening measuring up to 5 mm, nonspecific. Acute cholecystitis is unlikely in the absence of gallstones or positive sonographic Murphy sign. 2. Splenomegaly. Electronically Signed   By: Titus Dubin M.D.   On: 08/08/2018 15:27    Microbiology: Recent Results (from the past 240 hour(s))  Aspergillus Ag, BAL/Serum     Status: None   Collection Time: 08/15/18  2:22 PM   Specimen: Vein; Blood  Result Value Ref Range Status   Aspergillus Ag, BAL/Serum 0.04 0.00 - 0.49 Index Final    Comment: (NOTE) Performed At: [facility] [place], [place] [id number] Rush Farmer MD [id number] Performed At: [facility] [address] [place], [place] [id number] Katina Degree MDPhD [id number]      Labs: Basic  Metabolic Panel: Recent Labs  Lab 08/16/18 0511 08/17/18 0607 08/18/18 0549 08/19/18 0500 08/21/18 0726 08/22/18 0430  NA 133* 135 133* 134* 132* 130*  K 3.9 3.8 3.9 3.9 3.8 3.8  CL 101 101 100 102 100 100  CO2 _0 20* 20* 19*  GLUCOSE 91 92 95 153* 95 102*  BUN _1 CREATININE 0.49 0.51 0.59 0.68 0.54 0.62  CALCIUM 7.0* 7.2* 7.7* 7.8* 7.8* 7.6*  MG 1.6* 1.7  --  1.3*  --  1.3*   Liver Function Tests: Recent Labs  Lab 08/16/18 0511 08/17/18 0607 08/18/18 0549 08/19/18 0500 08/22/18 0430  AST 122* 104* 79* 67* 55*  ALT 84* 81* 73* 62* 53*  ALKPHOS 194* 180* 168* 144* 162*  BILITOT 1.3* 1.4* 1.4* 1.2 1.6*  PROT 5.8* 6.0* 6.4* 6.2* 6.9  ALBUMIN 2.1* 2.1* 2.3* 2.3* 2.6*   No results for input(s): LIPASE, AMYLASE in the last 168 hours. No results for input(s): AMMONIA in the last 168 hours. CBC: Recent Labs  Lab 08/16/18 0511 08/17/18 0607 08/18/18 0549 08/19/18 0500 08/20/18 0615 08/21/18 0526 08/22/18 0430  WBC 3.8* 3.3* 3.0* 3.9* 4.1 3.5* 3.7*  NEUTROABS 2.1 1.7 1.5* 1.9  --   --   --  HGB 7.6* 6.9* 7.1* 6.6* 10.7* 10.3* 10.1*  HCT 24.1* 21.6* 22.9* 21.1* 32.3* 32.0* 32.4*  MCV 94.1 94.3 96.2 97.7 91.2 91.4 93.6  PLT 15* 15* 19* 25* 20* 19* 25*   Cardiac Enzymes: No results for input(s): CKTOTAL, CKMB, CKMBINDEX, TROPONINI in the last 168 hours. BNP: BNP (last 3 results) Recent Labs    03/08/18 2129 08/02/18 1520  BNP 8.8 144.0*    ProBNP (last 3 results) No results for input(s): PROBNP in the last 8760 hours.  CBG: Recent Labs  Lab 08/21/18 1703 08/21/18 2059 08/22/18 0737 08/22/18 1139 08/22/18 1639  GLUCAP 292* 136* 95 206* 211*       Signed:  Fayrene Helper MD.  [facility] 08/22/2018, 5:54 PM

## 2018-08-23 ENCOUNTER — Other Ambulatory Visit: Payer: Self-pay | Admitting: *Deleted

## 2018-08-23 ENCOUNTER — Encounter: Payer: Self-pay | Admitting: *Deleted

## 2018-08-23 NOTE — Patient Outreach (Signed)
[facility]) Care Management  08/23/2018  Kealie Barrie 1985/12/30 [id number]  Transition of care call/case closure   Referral received: 08/05/18 Initial outreach: 08/23/18 Insurance: [employer]   Subjective: Initial successful telephone call to patient's preferred (mobile) number in order to complete transition of care assessment; 2 HIPAA identifiers verified. Explained purpose of call and completed transition of care assessment.  This RNCM familiar with Krystal Short from previous transition of care calls. Deshanna states she is tired and weak but otherwise OK. She denies fever, chills, pain, nausea or vomiting since hospital discharge. Reports she is tolerating her diet, denies bowel or bladder problems.  Spouse is assisting with her recovery.  She says she does not have the hospital indemnity She says she uses a [facility] outpatient pharmacy.  She denies educational needs related to staying safe during the COVID 19 pandemic.    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  Comorbidities include: HIV/AIDS, anemia, hilar adenopathy, molluscum contagiosum and thrombocytopenia She was discharged to home on 08/22/18 without the need for home health services or DME.   Assessment:  Patient voices good understanding of all discharge instructions.  See transition of care flowsheet for assessment details.   Plan:  Reviewed hospital discharge diagnosis of suspected pneumocystis pneumonia and treatment plan using hospital discharge instructions, assessing medication adherence, reviewing problems requiring provider notification, and discussing the importance of follow up with primary care provider and specialists as directed. No ongoing care management needs identified so will close case to [facility] services. Per patient request a  successful outreach letter will not be mailed to her home address since she received one in April and one in June. She verifies that she has this RNCM's contact information should future care management needs arise.   Barrington Ellison RN,CCM,CDE [facility] Coordinator Office Phone [phone] Office Fax [phone]

## 2018-08-24 LAB — ACID FAST CULTURE WITH REFLEXED SENSITIVITIES (MYCOBACTERIA): Acid Fast Culture: NEGATIVE

## 2018-08-28 ENCOUNTER — Other Ambulatory Visit: Payer: Self-pay | Admitting: *Deleted

## 2018-08-28 DIAGNOSIS — D649 Anemia, unspecified: Secondary | ICD-10-CM

## 2018-08-28 DIAGNOSIS — D696 Thrombocytopenia, unspecified: Secondary | ICD-10-CM

## 2018-08-29 ENCOUNTER — Other Ambulatory Visit: Payer: Self-pay

## 2018-08-29 ENCOUNTER — Inpatient Hospital Stay: Payer: 59

## 2018-08-29 ENCOUNTER — Inpatient Hospital Stay: Payer: 59 | Attending: Hematology | Admitting: Hematology

## 2018-08-29 VITALS — BP 74/42 | HR 125 | Temp 97.3°F | Resp 18 | Ht 61.0 in | Wt 113.7 lb

## 2018-08-29 DIAGNOSIS — D649 Anemia, unspecified: Secondary | ICD-10-CM

## 2018-08-29 DIAGNOSIS — F17210 Nicotine dependence, cigarettes, uncomplicated: Secondary | ICD-10-CM | POA: Insufficient documentation

## 2018-08-29 DIAGNOSIS — N179 Acute kidney failure, unspecified: Secondary | ICD-10-CM | POA: Diagnosis not present

## 2018-08-29 DIAGNOSIS — I959 Hypotension, unspecified: Secondary | ICD-10-CM | POA: Diagnosis not present

## 2018-08-29 DIAGNOSIS — Z79899 Other long term (current) drug therapy: Secondary | ICD-10-CM | POA: Insufficient documentation

## 2018-08-29 DIAGNOSIS — D761 Hemophagocytic lymphohistiocytosis: Secondary | ICD-10-CM

## 2018-08-29 DIAGNOSIS — E871 Hypo-osmolality and hyponatremia: Secondary | ICD-10-CM | POA: Insufficient documentation

## 2018-08-29 DIAGNOSIS — H538 Other visual disturbances: Secondary | ICD-10-CM | POA: Diagnosis not present

## 2018-08-29 DIAGNOSIS — R590 Localized enlarged lymph nodes: Secondary | ICD-10-CM | POA: Diagnosis not present

## 2018-08-29 DIAGNOSIS — M549 Dorsalgia, unspecified: Secondary | ICD-10-CM | POA: Diagnosis not present

## 2018-08-29 DIAGNOSIS — D696 Thrombocytopenia, unspecified: Secondary | ICD-10-CM

## 2018-08-29 DIAGNOSIS — R0902 Hypoxemia: Secondary | ICD-10-CM | POA: Diagnosis not present

## 2018-08-29 DIAGNOSIS — R161 Splenomegaly, not elsewhere classified: Secondary | ICD-10-CM | POA: Diagnosis not present

## 2018-08-29 DIAGNOSIS — K760 Fatty (change of) liver, not elsewhere classified: Secondary | ICD-10-CM | POA: Insufficient documentation

## 2018-08-29 DIAGNOSIS — Z888 Allergy status to other drugs, medicaments and biological substances status: Secondary | ICD-10-CM | POA: Insufficient documentation

## 2018-08-29 DIAGNOSIS — B20 Human immunodeficiency virus [HIV] disease: Secondary | ICD-10-CM | POA: Diagnosis not present

## 2018-08-29 DIAGNOSIS — R42 Dizziness and giddiness: Secondary | ICD-10-CM | POA: Diagnosis not present

## 2018-08-29 DIAGNOSIS — A419 Sepsis, unspecified organism: Secondary | ICD-10-CM | POA: Insufficient documentation

## 2018-08-29 DIAGNOSIS — R1030 Lower abdominal pain, unspecified: Secondary | ICD-10-CM | POA: Insufficient documentation

## 2018-08-29 DIAGNOSIS — F17290 Nicotine dependence, other tobacco product, uncomplicated: Secondary | ICD-10-CM | POA: Diagnosis not present

## 2018-08-29 DIAGNOSIS — K828 Other specified diseases of gallbladder: Secondary | ICD-10-CM | POA: Insufficient documentation

## 2018-08-29 DIAGNOSIS — R634 Abnormal weight loss: Secondary | ICD-10-CM | POA: Diagnosis not present

## 2018-08-29 DIAGNOSIS — J90 Pleural effusion, not elsewhere classified: Secondary | ICD-10-CM | POA: Diagnosis not present

## 2018-08-29 DIAGNOSIS — D61818 Other pancytopenia: Secondary | ICD-10-CM

## 2018-08-29 DIAGNOSIS — E860 Dehydration: Secondary | ICD-10-CM

## 2018-08-29 LAB — CMP (CANCER CENTER ONLY)
ALT: 34 U/L (ref 0–44)
AST: 45 U/L — ABNORMAL HIGH (ref 15–41)
Albumin: 2.1 g/dL — ABNORMAL LOW (ref 3.5–5.0)
Alkaline Phosphatase: 238 U/L — ABNORMAL HIGH (ref 38–126)
Anion gap: 9 (ref 5–15)
BUN: 13 mg/dL (ref 6–20)
CO2: 21 mmol/L — ABNORMAL LOW (ref 22–32)
Calcium: 8.4 mg/dL — ABNORMAL LOW (ref 8.9–10.3)
Chloride: 101 mmol/L (ref 98–111)
Creatinine: 0.84 mg/dL (ref 0.44–1.00)
GFR, Est AFR Am: >60 mL/min (ref >60)
GFR, Estimated: >60 mL/min (ref >60)
Glucose, Bld: 169 mg/dL — ABNORMAL HIGH (ref 70–99)
Potassium: 4 mmol/L (ref 3.5–5.1)
Sodium: 131 mmol/L — ABNORMAL LOW (ref 135–145)
Total Bilirubin: 1.9 mg/dL — ABNORMAL HIGH (ref 0.3–1.2)
Total Protein: 7.1 g/dL (ref 6.5–8.1)

## 2018-08-29 LAB — CBC WITH DIFFERENTIAL (CANCER CENTER ONLY)
Abs Immature Granulocytes: 0.1 10*3/uL — ABNORMAL HIGH (ref 0.00–0.07)
Basophils Absolute: 0 10*3/uL (ref 0.0–0.1)
Basophils Relative: 0 %
Eosinophils Absolute: 0 10*3/uL (ref 0.0–0.5)
Eosinophils Relative: 0 %
HCT: 26 % — ABNORMAL LOW (ref 36.0–46.0)
Hemoglobin: 8.2 g/dL — ABNORMAL LOW (ref 12.0–15.0)
Immature Granulocytes: 2 %
Lymphocytes Relative: 24 %
Lymphs Abs: 1.4 10*3/uL (ref 0.7–4.0)
MCH: 29.1 pg (ref 26.0–34.0)
MCHC: 31.5 g/dL (ref 30.0–36.0)
MCV: 92.2 fL (ref 80.0–100.0)
Monocytes Absolute: 0.9 10*3/uL (ref 0.1–1.0)
Monocytes Relative: 17 %
Neutro Abs: 3.3 10*3/uL (ref 1.7–7.7)
Neutrophils Relative %: 57 %
Platelet Count: 26 10*3/uL — ABNORMAL LOW (ref 150–400)
RBC: 2.82 MIL/uL — ABNORMAL LOW (ref 3.87–5.11)
RDW: 20.4 % — ABNORMAL HIGH (ref 11.5–15.5)
WBC Count: 5.7 10*3/uL (ref 4.0–10.5)
nRBC: 0.4 % — ABNORMAL HIGH (ref 0.0–0.2)

## 2018-08-29 LAB — SAMPLE TO BLOOD BANK

## 2018-08-29 LAB — LACTATE DEHYDROGENASE: LDH: 228 U/L — ABNORMAL HIGH (ref 98–192)

## 2018-08-29 MED ORDER — SODIUM CHLORIDE 0.9 % IV SOLN
INTRAVENOUS | Status: AC
  Administered 2018-08-29: 15:00:00 via INTRAVENOUS
  Filled 2018-08-29 (×2): qty 250

## 2018-08-29 NOTE — Progress Notes (Signed)
HEMATOLOGY ONCOLOGY PROGRESS NOTE  Date of service: .08/29/2018  Patient Care Team: Lajean Manes, MD as PCP - General (Internal Medicine)  Diagnosis: Thrombocytopenia/anemia  Current Treatment: Ongoing evaluation  SUMMARY OF ONCOLOGIC HISTORY: Oncology History   No history exists.    INTERVAL HISTORY:  Krystal Short is a 33 y.o. female presenting today for management and evaluation of her thrombocytopenia and anemia. She was last seen by Dr. Irene Limbo on 08/01/2018.  The pt reports that she just got out of the hospital last week. Since then, she has felt weak and has began dry coughing. She has also developed mild back pain. Denies fevers, blood in the stool, sore throat, new bone pain, new skin rapids, belly pain, leg swelling, changes in bowel movements and SOB. She drinks 4-5 bottles of water daily and eats in the morning and at dinner but usually skips lunch. She eats about half of what she normally eats and has been losing weight, but not since she got out of the hospital. She is not taking any antibiotics at this time.  She experienced some dizziness and blurry vision yesterday but did not pass out. Other than yesterday, she has been doing well. She is able to walk around the house without a walker or cane.  She is worried about walking around when she is home alone because she fears that she will fall. When her husband is home, she walks up and down a small flight of stairs to strengthen her legs. She does not have any home care or home therapy and "thought she would be okay" without going to a care facility.   Lab results today (08/29/2018) of CBC w/diff is as follows: all values are WNL except for RBC at 2.82, HGB at 8.2, HCT at 26.0, RDW at 20.4, PLT at 26, and nRBC at 0.4. 08/29/2018 Haptoglobin is pending 08/29/2018 LDH is pending 08/29/2018 CMP is pending  On review of systems, pt reports a dry cough, dizziness, lightheadedness and denies  fevers, blood in the stool,  sore throat, new bone pain, new skin rapids, belly pain, leg swelling, changes in bowel movements and SOB, and any other symptoms.   REVIEW OF SYSTEMS:    A 10+ POINT REVIEW OF SYSTEMS WAS OBTAINED including neurology, dermatology, psychiatry, cardiac, respiratory, lymph, extremities, GI, GU, Musculoskeletal, constitutional, breasts, reproductive, HEENT.  All pertinent positives are noted in the HPI.  All others are negative.   . Past Medical History:  Diagnosis Date  . Acute hyponatremia 06/03/2017  . Anemia   . CAP (community acquired pneumonia) 06/03/2017  . Community acquired pneumonia 06/05/2017  . Facial dermatitis 06/03/2017  . HIV (human immunodeficiency virus infection) ([place])   . Pleurisy 06/03/2017  . Pneumonia 06/06/2017  . 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 ([place])   . Thrush of mouth and esophagus ([place])   . UTI (urinary tract infection)     . Past Surgical History:  Procedure Laterality Date  . BIOPSY  07/12/2018   Procedure: BIOPSY;  Surgeon: Laurin Coder, MD;  Location: [facility];  Service: Endoscopy;;  . BRONCHIAL WASHINGS  07/12/2018   Procedure: BRONCHIAL WASHINGS;  Surgeon: Laurin Coder, MD;  Location: [facility];  Service: Endoscopy;;  . ENDOBRONCHIAL ULTRASOUND N/A 07/12/2018   Procedure: ENDOBRONCHIAL ULTRASOUND;  Surgeon: Laurin Coder, MD;  Location: [facility];  Service: Endoscopy;  Laterality: N/A;  . FINE NEEDLE ASPIRATION BIOPSY  07/12/2018   Procedure: FINE NEEDLE ASPIRATION BIOPSY;  Surgeon: Laurin Coder,  MD;  Location: [facility];  Service: Endoscopy;;  . NO PAST SURGERIES    . VIDEO BRONCHOSCOPY N/A 07/12/2018   Procedure: VIDEO BRONCHOSCOPY WITHOUT FLUORO;  Surgeon: Laurin Coder, MD;  Location: [facility];  Service: Endoscopy;  Laterality: N/A;    . Social History   Tobacco Use  . Smoking status: Former Smoker    Packs/day: 0.25    Years: 8.00    Pack years: 2.00    Types: Cigarettes, Cigars     Quit date: 12/25/2016    Years since quitting: 1.6  . Smokeless tobacco: Never Used  Substance Use Topics  . Alcohol use: Yes    Comment: weekend/socially  . Drug use: No    ALLERGIES:  is allergic to heparin.  MEDICATIONS:  Current Outpatient Medications  Medication Sig Dispense Refill  . acetaminophen (TYLENOL) 325 MG tablet Take 650 mg by mouth every 6 (six) hours as needed for mild pain or headache.    Marland Kitchen atovaquone (MEPRON) 750 MG/5ML suspension Take 10 mLs (1,500 mg total) by mouth daily. 300 mL 0  . B Complex-C (B-COMPLEX WITH VITAMIN C) tablet Take 1 tablet by mouth daily. 30 tablet 0  . benzonatate (TESSALON) 100 MG capsule Take 100-200 mg by mouth 2 (two) times daily as needed for cough.    . bictegravir-emtricitabine-tenofovir AF (BIKTARVY) 50-200-25 MG TABS tablet Take 1 tablet by mouth daily. 30 tablet 3  . folic acid (FOLVITE) 1 MG tablet Take 2 tablets (2 mg total) by mouth daily. 60 tablet 0  . ondansetron (ZOFRAN) 4 MG tablet Take 1 tablet (4 mg total) by mouth every 6 (six) hours. (Patient not taking: Reported on 08/23/2018) 12 tablet 0  . predniSONE (DELTASONE) 10 MG tablet Take 3 tablets (30 mg total) by mouth daily with breakfast for 14 days. Please follow up with your oncologist as an outpatient for a taper (do not stop abruptly) 42 tablet 0   Current Facility-Administered Medications  Medication Dose Route Frequency Provider Last Rate Last Dose  . 0.9 %  sodium chloride infusion   Intravenous Continuous Irene Limbo, Cloria Spring, MD        PHYSICAL EXAMINATION: ECOG PERFORMANCE STATUS: 2-3  . Vitals:   08/29/18 1313 08/29/18 1314  BP: (!) 73/45 (!) 74/42  Pulse:    Resp:    Temp:      Filed Weights   08/29/18 1310  Weight: 113 lb 11.2 oz (51.6 kg)   .Body mass index is 21.48 kg/m.   GENERAL:alert, in no acute distress and comfortable SKIN: no acute rashes, no significant lesions EYES: conjunctiva are pink and non-injected, sclera anicteric OROPHARYNX:  MMM, no exudates, no oropharyngeal erythema or ulceration NECK: supple, no JVD LYMPH:  no palpable lymphadenopathy in the cervical, axillary or inguinal regions LUNGS: clear to auscultation b/l with normal respiratory effort HEART: regular rate & rhythm ABDOMEN:  normoactive bowel sounds , non tender, not distended. Extremity: no pedal edema PSYCH: alert & oriented x 3 with fluent speech NEURO: no focal motor/sensory deficits  LABORATORY DATA:   I have reviewed the data as listed  . CBC Latest Ref Rng & Units 08/29/2018 08/22/2018 08/21/2018  WBC 4.0 - 10.5 K/uL 5.7 3.7(L) 3.5(L)  Hemoglobin 12.0 - 15.0 g/dL 8.2(L) 10.1(L) 10.3(L)  Hematocrit 36.0 - 46.0 % 26.0(L) 32.4(L) 32.0(L)  Platelets 150 - 400 K/uL 26(L) 25(LL) 19(LL)    . CMP Latest Ref Rng & Units 08/29/2018 08/22/2018 08/21/2018  Glucose 70 - 99 mg/dL 169(H) 102(H) 95  BUN 6 - 20 mg/dL 13 13 11   Creatinine 0.44 - 1.00 mg/dL 0.84 0.62 0.54  Sodium 135 - 145 mmol/L 131(L) 130(L) 132(L)  Potassium 3.5 - 5.1 mmol/L 4.0 3.8 3.8  Chloride 98 - 111 mmol/L 101 100 100  CO2 22 - 32 mmol/L 21(L) 19(L) 20(L)  Calcium 8.9 - 10.3 mg/dL 8.4(L) 7.6(L) 7.8(L)  Total Protein 6.5 - 8.1 g/dL 7.1 6.9 -  Total Bilirubin 0.3 - 1.2 mg/dL 1.9(H) 1.6(H) -  Alkaline Phos 38 - 126 U/L 238(H) 162(H) -  AST 15 - 41 U/L 45(H) 55(H) -  ALT 0 - 44 U/L 34 53(H) -     RADIOGRAPHIC STUDIES: I have personally reviewed the radiological images as listed and agreed with the findings in the report. Dg Chest 2 View  Result Date: 08/12/2018 CLINICAL DATA:  33 year old female with history of AIDS and concern for pneumonia. EXAM: CHEST - 2 VIEW COMPARISON:  Chest CT dated 08/02/2018 and radiograph dated 08/02/2018 FINDINGS: Overall interval improvement of bilateral reticular densities. No focal consolidation, pleural effusion, or pneumothorax. Top-normal cardiac silhouette. No acute osseous pathology. IMPRESSION: Interval improvement of the reticular pulmonary  densities since the prior CT and radiograph. No focal consolidation. Electronically Signed   By: Anner Crete M.D.   On: 08/12/2018 14:12   Nm Hepatobiliary Liver Func  Result Date: 08/10/2018 CLINICAL DATA:  Lower abdominal pain since June 25th.  Nausea. EXAM: NUCLEAR MEDICINE HEPATOBILIARY IMAGING TECHNIQUE: Sequential images of the abdomen were obtained out to 60 minutes following intravenous administration of radiopharmaceutical. RADIOPHARMACEUTICALS:  7.4 mCi Tc-49m Choletec IV COMPARISON:  Ultrasound of 08/08/2018. FINDINGS: Prompt uptake and biliary excretion of activity by the liver is seen. Gallbladder activity is visualized, consistent with patency of cystic duct. Biliary activity passes into small bowel, consistent with patent common bile duct. IMPRESSION: Normal hepatobiliary study, without evidence of acute cholecystitis. Electronically Signed   By: KAbigail MiyamotoM.D.   On: 08/10/2018 16:01   Dg Chest Port 1 View  Result Date: 08/02/2018 CLINICAL DATA:  Pt reported center chest pain and SOB that started a few minutes ago. EXAM: PORTABLE CHEST - 1 VIEW COMPARISON:  none FINDINGS: Worsening bilateral reticulonodular ground-glass opacities. Heart size and mediastinal contours are within normal limits. No effusion. No pneumothorax. Visualized bones unremarkable. IMPRESSION: Continued worsening of bilateral reticulonodular ground-glass opacities. Electronically Signed   By: DLucrezia EuropeM.D.   On: 08/02/2018 15:51   Dg Chest Port 1 View  Result Date: 08/02/2018 CLINICAL DATA:  Fever and hypoxia EXAM: PORTABLE CHEST 1 VIEW COMPARISON:  07/12/2018, 06/18/2018, CT chest 07/05/2018 FINDINGS: Bilateral reticular and ground-glass opacity, increased compared to prior. Normal heart size. No pneumothorax. IMPRESSION: Interval worsening of bilateral reticular and ground-glass infiltrates since radiograph 07/12/2018. Electronically Signed   By: KDonavan FoilM.D.   On: 08/02/2018 00:03   Ct Angio  Chest/abd/pel For Dissection W And/or W/wo  Result Date: 08/02/2018 CLINICAL DATA:  Back pain EXAM: CT ANGIOGRAPHY CHEST, ABDOMEN AND PELVIS TECHNIQUE: Multidetector CT imaging through the chest, abdomen and pelvis was performed using the standard protocol during bolus administration of intravenous contrast. Multiplanar reconstructed images and MIPs were obtained and reviewed to evaluate the vascular anatomy. CONTRAST:  107mOMNIPAQUE IOHEXOL 350 MG/ML SOLN COMPARISON:  CT chest dated Jul 04, 2018 FINDINGS: CTA CHEST FINDINGS Cardiovascular: Evaluation for pulmonary emboli is limited by respiratory motion artifact. Given this limitation, there is no large centrally located pulmonary embolus. There is no definite dissection. The heart size  is normal. There is no large pericardial effusion. The intracardiac blood pool is hypodense relative to the adjacent myocardium consistent with anemia. Mediastinum/Nodes: There are enlarged mediastinal and hilar lymph nodes. For example there is a right paratracheal lymph node measuring approximately 1.4 cm. The thyroid gland is unremarkable. There is mild supraclavicular adenopathy. Lungs/Pleura: There are diffuse ground-glass airspace opacities bilaterally with prominent interlobular septal thickening. There are trace bilateral pleural effusions. There is no pneumothorax. The trachea is unremarkable. Musculoskeletal: No chest wall abnormality. No acute or significant osseous findings. Review of the MIP images confirms the above findings. CTA ABDOMEN AND PELVIS FINDINGS VASCULAR Aorta: Normal caliber aorta without aneurysm, dissection, vasculitis or significant stenosis. Celiac: Patent without evidence of aneurysm, dissection, vasculitis or significant stenosis. SMA: Patent without evidence of aneurysm, dissection, vasculitis or significant stenosis. Renals: Both renal arteries are patent without evidence of aneurysm, dissection, vasculitis, fibromuscular dysplasia or  significant stenosis. IMA: Patent without evidence of aneurysm, dissection, vasculitis or significant stenosis. Inflow: Patent without evidence of aneurysm, dissection, vasculitis or significant stenosis. Veins: No obvious venous abnormality within the limitations of this arterial phase study. Review of the MIP images confirms the above findings. NON-VASCULAR Hepatobiliary: There is hepatic steatosis. The gallbladder is unremarkable. Pancreas: Unremarkable. No pancreatic ductal dilatation or surrounding inflammatory changes. Spleen: The spleen is significantly enlarged. Adrenals/Urinary Tract: Adrenal glands are unremarkable. Kidneys are normal, without renal calculi, focal lesion, or hydronephrosis. Bladder is unremarkable. Stomach/Bowel: Stomach is within normal limits. Appendix appears normal. No evidence of bowel wall thickening, distention, or inflammatory changes. Lymphatic: There is scattered prominent retroperitoneal and mesenteric lymph nodes. There are prominent inguinal lymph nodes. Reproductive: Uterus and bilateral adnexa are unremarkable. Other: There is mild body wall edema. There is a small volume of free fluid in the pelvis. Musculoskeletal: No acute or significant osseous findings. Review of the MIP images confirms the above findings. IMPRESSION: 1. No evidence of a dissection. 2. Evaluation for pulmonary emboli is limited by motion artifact. Given this limitation there is no large centrally located pulmonary embolus. 3. Again noted are diffuse bilateral ground-glass airspace opacities most likely representing an atypical infectious process. Sarcoidosis can have a similar appearance in the appropriate clinical setting. 4. Trace bilateral pleural effusions. 5. Scattered adenopathy throughout the chest, abdomen, and pelvis, likely related to the patient's underlying history of HIV. 6. Splenomegaly. 7. Hepatic steatosis. 8. Small volume free fluid in the pelvis, likely physiologic. Electronically  Signed   By: Constance Holster M.D.   On: 08/02/2018 23:15   US Abdomen Limited Ruq  Result Date: 08/08/2018 CLINICAL DATA:  Elevated bilirubin. EXAM: ULTRASOUND ABDOMEN LIMITED RIGHT UPPER QUADRANT COMPARISON:  CT abdomen pelvis dated July 11, 2018. FINDINGS: Gallbladder: Sludge. No gallstones. Asymmetric wall thickening measuring up to 5 mm. No sonographic Murphy sign noted by sonographer. Common bile duct: Diameter: 3 mm, normal. Liver: No focal lesion identified. Within normal limits in parenchymal echogenicity. Portal vein is patent on color Doppler imaging with normal direction of blood flow towards the liver. Incidental note is made of splenomegaly. IMPRESSION: 1. Gallbladder sludge with asymmetric wall thickening measuring up to 5 mm, nonspecific. Acute cholecystitis is unlikely in the absence of gallstones or positive sonographic Murphy sign. 2. Splenomegaly. Electronically Signed   By: Titus Dubin M.D.   On: 08/08/2018 15:27    ASSESSMENT & PLAN:   1. Anemia  2. Thrombocytopenia 3. PCP , COVid19 neg 4. Sepsis  5. AKI 6. HIV/AIDS  -Anemia thrombocytopenia are likely multifactorial. She has  no evidence of TTP/MAHA based on current labs. Peripheral smear available for review.  -Bone marrow biopsy results showed no overt evidence of granulomas lymphoma AFB or other overt viral inclusions. No overt evidence of MDS-like changes related to HIV. No megaloblastoid changes. Some minor hemophagocytic forms noted in the bone marrow which is likely reactive given her recurrent infections. No acute interventions for the minor hemophagocytic changes at this time other than treating underlying infectious process or acute inflammatory process.  -It appears that she has remained off Bactrim but is currently taking dapsone 100 mg daily.  Dapsone has been placed on hold pending infectious disease consult. She is now off Bactrim and Dapsone. PLAN -She should continue folic acid and B complex vitamin  to support hematopoiesis. -Transfuse packed red blood cells for hemoglobin less than 7 or active bleeding. -Transfuse platelets for platelet count less than 20,000 if active bleeding or in the setting of sepsis.  She is not currently bleeding. Please do 1 hour post count after each use of platelets.  -Discussed pt labwork today, 08/29/2018; low blood counts. -Discussed bone marrow biopsy that showed no signs of myeloma or lymphoma. -Discussed that low BP could be caused by a number of factors including infection, dehydration, and under eating -Low BP, consider Midodrine in the future if necessary -Recommend that patient returns to the ED for an evaluation because of her low BP and dizziness/lightheadedness. Pt voiced understanding and decided not to go to the ED. Pt knows to return to ED is symptoms worsen. -Taper prednisone to 20 mg  -Recommend IVIg to treat immune thrombocytopenia -Cannot rule out active CMV/viral pneumonitis or sepsis -IV fluids today -Recommend increasing salt intake -F/U with PCP as scheduled on Monday -Follow up in 3 weeks   - IVF today -labs and PRBC transfusion x 2 in 1 week and 2 weeks -IVIG in 7-8 days weekly x 2 doses -RTC with Dr Irene Limbo in 3 weeks with labs   I spent 25 counseling the patient face to face. The total time spent in the appointment was 25 and more than 50% was on counseling and direct patient cares.   Sullivan Lone MD [place] AAHIVMS [facility] Hematology/Oncology Physician [facility]  (Office):       [phone] (Work cell):  [phone] (Fax):           [phone]  I, De Burrs, am acting as a scribe for Dr. Irene Limbo  .I have reviewed the above documentation for accuracy and completeness, and I agree with the above. Brunetta Genera MD

## 2018-08-29 NOTE — Patient Instructions (Signed)
Thank you for choosing [facility] to provide your oncology and hematology care.   Should you have questions after your visit to the [facility] ([facility]), please contact this office at [phone] between 8:30 AM and 4:30 PM. Voicemails left after 4:00 PM may not be returned until the following business day. Calls received after 4:30 PM will be answered by an off-site Nurse Triage Line.    Prescription Refills:  Please have your pharmacy contact us directly for most prescription requests.  Contact the office directly for refills of narcotics (pain medications). Allow 48-72 hours for refills.  Appointments: Please contact the [facility] scheduling department [phone] for questions regarding [facility] appointment scheduling.  Contact the schedulers with any scheduling changes so that your appointment can be rescheduled in a timely manner.   Central Scheduling for [place] [phone] - Call to schedule procedures such as PET scans, CT scans, MRI, Ultrasound, etc.  To afford each patient quality time with our providers, please arrive 30 minutes before your scheduled appointment time.  If you arrive late for your appointment, you may be asked to reschedule.  We strive to give you quality time with our providers, and arriving late affects you and other patients whose appointments are after yours. If you are a no show for multiple scheduled visits, you may be dismissed from the clinic at the providers discretion.     Resources: [facility] Social Workers [phone] for additional information on assistance programs --Anne Cunningham/Abigail Elmore  [place] DSS  [phone]: Information regarding food stamps, Medicaid, and utility assistance [employer] [phone]   [place] [employer]'s shared-ride transportation service for eligible riders who have a disability that prevents them from riding the fixed route bus.   Medicare Rights Center 800-333-4114 Helps people with  Medicare understand their rights and benefits, navigate the Medicare system, and secure the quality healthcare they deserve [employer] 800-227-2345 Assists patients locate various types of support and financial assistance Cancer Care: 1-800-813-HOPE (4673) Provides financial assistance, online support groups, medication/co-pay assistance.      

## 2018-08-29 NOTE — Patient Instructions (Signed)
Dehydration, Adult ° °Dehydration is when there is not enough fluid or water in your body. This happens when you lose more fluids than you take in. Dehydration can range from mild to very bad. It should be treated right away to keep it from getting very bad. °Symptoms of mild dehydration may include: °· Thirst. °· Dry lips. °· Slightly dry mouth. °· Dry, warm skin. °· Dizziness. °Symptoms of moderate dehydration may include: °· Very dry mouth. °· Muscle cramps. °· Dark pee (urine). Pee may be the color of tea. °· Your body making less pee. °· Your eyes making fewer tears. °· Heartbeat that is uneven or faster than normal (palpitations). °· Headache. °· Light-headedness, especially when you stand up from sitting. °· Fainting (syncope). °Symptoms of very bad dehydration may include: °· Changes in skin, such as: °? Cold and clammy skin. °? Blotchy (mottled) or pale skin. °? Skin that does not quickly return to normal after being lightly pinched and let go (poor skin turgor). °· Changes in body fluids, such as: °? Feeling very thirsty. °? Your eyes making fewer tears. °? Not sweating when body temperature is high, such as in hot weather. °? Your body making very little pee. °· Changes in vital signs, such as: °? Weak pulse. °? Pulse that is more than 100 beats a minute when you are sitting still. °? Fast breathing. °? Low blood pressure. °· Other changes, such as: °? Sunken eyes. °? Cold hands and feet. °? Confusion. °? Lack of energy (lethargy). °? Trouble waking up from sleep. °? Short-term weight loss. °? Unconsciousness. °Follow these instructions at home: ° °· If told by your doctor, drink an ORS: °? Make an ORS by using instructions on the package. °? Start by drinking small amounts, about ½ cup (120 mL) every 5-10 minutes. °? Slowly drink more until you have had the amount that your doctor said to have. °· Drink enough clear fluid to keep your pee clear or pale yellow. If you were told to drink an ORS, finish the  ORS first, then start slowly drinking clear fluids. Drink fluids such as: °? Water. Do not drink only water by itself. Doing that can make the salt (sodium) level in your body get too low (hyponatremia). °? Ice chips. °? Fruit juice that you have added water to (diluted). °? Low-calorie sports drinks. °· Avoid: °? Alcohol. °? Drinks that have a lot of sugar. These include high-calorie sports drinks, fruit juice that does not have water added, and soda. °? Caffeine. °? Foods that are greasy or have a lot of fat or sugar. °· Take over-the-counter and prescription medicines only as told by your doctor. °· Do not take salt tablets. Doing that can make the salt level in your body get too high (hypernatremia). °· Eat foods that have minerals (electrolytes). Examples include bananas, oranges, potatoes, tomatoes, and spinach. °· Keep all follow-up visits as told by your doctor. This is important. °Contact a doctor if: °· You have belly (abdominal) pain that: °? Gets worse. °? Stays in one area (localizes). °· You have a rash. °· You have a stiff neck. °· You get angry or annoyed more easily than normal (irritability). °· You are more sleepy than normal. °· You have a harder time waking up than normal. °· You feel: °? Weak. °? Dizzy. °? Very thirsty. °· You have peed (urinated) only a small amount of very dark pee during 6-8 hours. °Get help right away if: °· You have   symptoms of very bad dehydration. °· You cannot drink fluids without throwing up (vomiting). °· Your symptoms get worse with treatment. °· You have a fever. °· You have a very bad headache. °· You are throwing up or having watery poop (diarrhea) and it: °? Gets worse. °? Does not go away. °· You have blood or something green (bile) in your throw-up. °· You have blood in your poop (stool). This may cause poop to look black and tarry. °· You have not peed in 6-8 hours. °· You pass out (faint). °· Your heart rate when you are sitting still is more than 100 beats a  minute. °· You have trouble breathing. °This information is not intended to replace advice given to you by your health care provider. Make sure you discuss any questions you have with your health care provider. °Document Released: 11/19/2008 Document Revised: 01/05/2017 Document Reviewed: 03/19/2015 °Elsevier Patient Education © 2020 [employer].Coronavirus (COVID-19) Are you at risk? ° °Are you at risk for the Coronavirus (COVID-19)? ° °To be considered HIGH RISK for Coronavirus (COVID-19), you have to meet the following criteria: ° °• Traveled to [place], [place], [place], [place] or [place]; or in the [place] to [place], [place], [place], or [place]; and have fever, cough, and shortness of breath within the last 2 weeks of travel OR °• Been in close contact with a person diagnosed with COVID-19 within the last 2 weeks and have fever, cough, and shortness of breath °• IF YOU DO NOT MEET THESE CRITERIA, YOU ARE CONSIDERED LOW RISK FOR COVID-19. ° °What to do if you are HIGH RISK for COVID-19? ° °• If you are having a medical emergency, call 911. °• Seek medical care right away. Before you go to a doctor’s office, urgent care or emergency department, call ahead and tell them about your recent travel, contact with someone diagnosed with COVID-19, and your symptoms. You should receive instructions from your physician’s office regarding next steps of care.  °• When you arrive at healthcare provider, tell the healthcare staff immediately you have returned from visiting [place], [place], [place], [place] or [place]; or traveled in the [place] to [place], [place], [place], or [place]; in the last two weeks or you have been in close contact with a person diagnosed with COVID-19 in the last 2 weeks.   °• Tell the health care staff about your symptoms: fever, cough and shortness of breath. °• After you have been seen by a medical provider, you will be either: °o Tested for (COVID-19) and discharged  home on quarantine except to seek medical care if symptoms worsen, and asked to  °- Stay home and avoid contact with others until you get your results (4-5 days)  °- Avoid travel on public transportation if possible (such as bus, train, or airplane) or °o Sent to the Emergency Department by EMS for evaluation, COVID-19 testing, and possible admission depending on your condition and test results. ° °What to do if you are LOW RISK for COVID-19? ° °Reduce your risk of any infection by using the same precautions used for avoiding the common cold or flu:  °• Wash your hands often with soap and warm water for at least 20 seconds.  If soap and water are not readily available, use an alcohol-based hand sanitizer with at least 60% alcohol.  °• If coughing or sneezing, cover your mouth and nose by coughing or sneezing into the elbow areas of your shirt or   coat, into a tissue or into your sleeve (not your hands). °• Avoid shaking hands with others and consider head nods or verbal greetings only. °• Avoid touching your eyes, nose, or mouth with unwashed hands.  °• Avoid close contact with people who are sick. °• Avoid places or events with large numbers of people in one location, like concerts or sporting events. °• Carefully consider travel plans you have or are making. °• If you are planning any travel outside or inside the [place], visit the CDC’s Travelers’ Health webpage for the latest health notices. °• If you have some symptoms but not all symptoms, continue to monitor at home and seek medical attention if your symptoms worsen. °• If you are having a medical emergency, call 911. ° ° °ADDITIONAL HEALTHCARE OPTIONS FOR PATIENTS ° °[facility] / e-Visit: [email or web address] °        °[facility]: [phone] ° °[facility]: [phone]          °         °[facility]: [phone]  °

## 2018-08-30 ENCOUNTER — Encounter: Payer: Self-pay | Admitting: Internal Medicine

## 2018-08-30 ENCOUNTER — Telehealth: Payer: Self-pay | Admitting: Hematology

## 2018-08-30 ENCOUNTER — Ambulatory Visit: Payer: 59 | Admitting: Internal Medicine

## 2018-08-30 DIAGNOSIS — D649 Anemia, unspecified: Secondary | ICD-10-CM | POA: Diagnosis not present

## 2018-08-30 DIAGNOSIS — R918 Other nonspecific abnormal finding of lung field: Secondary | ICD-10-CM | POA: Diagnosis not present

## 2018-08-30 DIAGNOSIS — D696 Thrombocytopenia, unspecified: Secondary | ICD-10-CM

## 2018-08-30 DIAGNOSIS — J9601 Acute respiratory failure with hypoxia: Secondary | ICD-10-CM | POA: Diagnosis not present

## 2018-08-30 LAB — HAPTOGLOBIN: Haptoglobin: 242 mg/dL (ref 33–278)

## 2018-08-30 NOTE — Telephone Encounter (Signed)
Scheduled appt per 7/23 los.  Spoke with patient and she is aware of her appt date and time.  Waiting on approval for blood on 8/6.  Told patient I will contact them once it is added.

## 2018-08-30 NOTE — Patient Instructions (Addendum)
Taper prednisone as per hematology   Keep appt to see ID   Pulmonary follow up is as needed

## 2018-08-30 NOTE — Progress Notes (Signed)
Krystal Short, female    DOB: 11-05-1985,     MRN: [id number]   Brief patient profile:  49 yobf  Quit smoking 12/2016 s resp problems but dx with HIV April 2019 presented with pna and breathing never right since so referred to pulmonary clinic 06/18/2018 by Dr   Tawanna Solo s/p admit:   Admit date: 05/28/2018 Discharge date: 05/31/2018  Admitted From: Home Disposition:  Home  Discharge Condition:Stable CODE STATUS:FULL Diet recommendation: Regular  Brief/Interim Summary: Patient is a 33 year old African-American female with a past medical history of HIV, PCP who presents to the emergency department with complaints of fever, shortness of breath, cough. Chest x-ray done on presentation suggestiveof atypical infection. COVID-19 ruled out. Possibility of PCP pneumonia. ID consulted. CT chest showed enlarged lymph nodes.  Patient was started on Bactrim and prednisone for PCP.  Her respiratory status significantly improved.  She is hemodynamically stable for discharge today.  She will be called by pulmonology for follow-up because her CT was concerning for sarcoidosis although there is less possibility.  Following problems were addressed during her hospitalization:  Atypical pneumonia/likely PCP :Respiratory status remains stable.Saturating fine  on room air. No complaints of shortness of breath or cough. Started treatment for PC pneumonia as per ID. On bactrim and prednisone.ID had beenfollowing.  Repeat chest x-ray in 2 to 3 weeks.  Follow-up with pulmonology as an outpatient.   Possible sarcoidosis: No history ofsarcoidosisin the past. CT chest showed enlarged mediastinal and bilateral hilar lymph nodes. Low possibility ofsarcoidosis. I have notified pulmonary team for arranging outpatient follow-up.  HIV/AIDS: Last CD4 count was 10 about 2 months PTA Follows with infectious disease. ID was following.  Thrombocytopenia/Anemia: Most likely associated with HIV  medications. Continue outpatient monitoring.  She has been transfused in the past for anemia.  Hypomagnesemia: Supplemented.  Sinus tachycardia:Much improved.      History of Present Illness  06/18/2018  Pulmonary/ 1st office eval/[facility]  Chief Complaint  Patient presents with  . Pulmonary Consult    Referred by Dr. Tawanna Solo. Pt c/o SOB for the past year- with or without exertion. She was slighty SOB walking from lobby to exam room today. She also c/o non prod cough.   Used to be able work out including running for 30 min including p quit smoking/ ran track in HS = 400 meters in  [place] Dyspnea:  Room to room doe Cough: better, was always dry assoc with some hb cc's since on prednisone  Sleep: on side/ bed flat / two pillows, wakes up coughing sev times a night, better p something to drink SABA use: none now/ Proair not helpful in past - no longer using  Prednisone last dose 06/18/2018 better x 50% rec No change/ finish pred and then rtc > readmitted     Admit date: 08/01/2018 Discharge date: 08/22/2018  Recommendations for Outpatient Follow-up:  1. Follow up outpatient CBC/CMP/magnesium 2. Discharged on prednisone 30 mg daily, per Dr. Irene Limbo, will defer taper to outpatient heme 3. If pt has repeat bone marrow bx, consider sending sample for routine, fungal, AFB, and viral cultures (see ID note) 4. Follow up with pulmonology outpatient - when pt platelets improved, consider lung biopsy given recurrent admissions for respiratory complaints without clear diagnosis 5. Started on mepron for PCP prophylaxis 6. Follow positive CMV outpatient -> holding off on treatment with valcyte as can be myelosuppressive - follow up with ID 7. Pt had sinus tachycardia here, improved on metoprolol.  BP's soft in 03'K systolic  prior to discharge, metoprolol d/c'd.  Continue to follow outpatient and adjust meds, work up further as indicated. 8. Follow blood sugars outpatient while on steroids - may  need insulin if plan for prolonged course, but fastin ok today, will hold off on starting this at this time 9. Elevated troponin noted while inpatient, thought 2/2 demand ischemia, consider outpatient cards f/u   Discharge Diagnoses:  Principal Problem:   Suspected pneumocystis pneumonia ([place])   Hyponatremia   AIDS (acquired immune deficiency syndrome) ([place])   Molluscum contagiosum   Symptomatic anemia   Thrombocytopenia ([facility])   Pulmonary infiltrates   Fever   Sepsis ([place])   Acute respiratory failure with hypoxemia ([facility])   Chest pain   AKI (acute kidney injury) ([place])   Elevated bilirubin   Pancytopenia, acquired ([place])     History of present illness:  Krystal Graingeris an 33 y.o.femalewith a past medical history significant for AIDS (recent CD4 count was <35 on 06/20/2018),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,questionablesarcoidosis(currently undergoing pulmonary work-up), ASCUS, and anemia.  She was admitted with bilateral lung infiltrates and sepsis. She was coronavirus 229 e positive. There was also concern for PCP and shewas startedon steroids, clindamycin and primaquine. Hospitalization c/b anemia and thrombocytopenia requiring recurrent transfusions. Heme is following. Hospitalization also c/b elevated liver enzymes. GI was consulted. This is now improving. At one point in time, there was plan for possible bronchoscopy or VATS, but ptsinceimproving from respiratory status the decision was made tohold off for now. She completed her therapy for PCP inpatient.  Her Hb has been relatively stable for ~3 days after the last transfusion and her last platelet transfusion was on 7/7.  Discussed with heme who was comfortable with outpatient follow up.    See below for additional details.  Hospital Course:  Acute hypoxic respiratory failure with fever  Bilateral lung infiltrate and sepsis: +coronavirus 229E and suspected PJP on empiric Rx as below  - autoimmune and vasculitis panel unremarkable (negative GBM ab, mpo/pr-3 ab, [place] ab, scl-70 ab, anti CCP, RF, anti DS DNA, ANA) - normal ACE  - aspergillus ag/serumnegative,fungitell negative, cryptococcal antigen negative, histoplasma antigen negative. Follow AFB blood cx (sent on 7/1- pending) - negative urine strep and legionella  -COVID 19 negative -NP swab: +coronavirus (229E) -BC NGTD -bordetella pertussis pcr negative -Parvo IgG + but IgM negative -Pneumocystis smear was not collected  -bronchoscopy on 6/5, biopsy" SCANT LUNG PARENCHYMA WITH FIBROSIS AND REACTIVE CHANGES. - NO GRANULOMATA IDENTIFIED." -concern is for PJP- on empiric primaquine po and clindamycin IV as well asprednisone 30 qd,pt completed this in house -pulmonology and infectious disease input appreciated - ID recommended lung biopsy at some point if thrombocytopenia is able to improve given repeated admissions for respiratory complaints (see 7/16 note) -cmv DNA pcr qualitative +, CMV DNA quantitative(265), which per ID is more c/w commensal infection than a primary/causal role - treatment deferred with valcyte given possibility for this medication to be myelosuppressive    Sinus tachycardia tsh unremarkable Likely reactive,started on metop earlier in admissionwith improvement.  Pt BP soft.  Will discontinue this at discharge.  Continue outpatient follow up.  Anemia/Thrombocytopenia S/p extensive work up including bone marrow biopsy, Etiology unclear  Hematology following Per hematology, transfuse if hgb less than 7 or active bleed transfuse if plt less than 20k or active bleed, check one hr post transfusion plt S/p prbc transfusion x8 S/p plt transfusion x7 Discussed with Dr. Irene Limbo from heme prior to discharge.  Noted pt could follow  up with heme outpatient.  Recommended continuing at current steroid dose at this time.  Pt last pRBC transfusion was on 7/13 and Hb relatively stable.  Last platelet  transfusion 7/7.  Will plan to follow outpatient.  Hyponatremia:  Sodium in the 120's on presentation from dehydration vs SIADH,  130's at dc  Follow outpatient  Hypomagnesemia Replace, repeat, keep mag>2 Follow outpatient  AKI: Cr 1.23 on presentation, cr normalized  Metabolic acidosis,treated with sodium bicarb, resolved, off sodium bicarb, monitor (now with mild NAGMA - continue to monitor outpatient)  Elevated LFT's  Elevated Bilirubin  Elevated Alk Phos  Jaundice:  [place] with gallbladder sludge with asymmetric wall thickening measuring up to 5 mm. GI consult, appreciate recs -> recommendingHIDA scan (negative) ASMA(negative), AMA(negative),CMV(as above)  Intermittent back pain High sensitivity troponin unremarkable, CTA chest/ab negative for dissection , no large PE No midline TTP on my exam today, seems mostly paraspinal, continue to monitor  Hyperglycemia -on steroids - A1c 5.5 on 7/3 - suspect this will improve as steroids are tapered.  Appropriate fasting BG this AM, but fluctuating a bit.  Will hold off on insulin at this point in time.  Follow outpatient.  Taper steroids.  Lower extremity edema: resolved with iv lasix , monitor   Sore throat  Concern for thrush: started on nystatin with concern for thrush  HIV/AIDS CD4=10 06/05/2017 On biktarvy ID following  Frequent hospitalizations: Review of recordssummarized by Dr Maudie Mercury on admission: "Admission 4/28-06/11/2017 tx empirically for PCP pneumonia  Admission 1/31-03/10/2018 Acute respiratory failure secondary to pneumonia (rhinovirus) tx with rocephin/ zithromax, Bactrim for PCP Macrocytic anemia   Admission 4/21-4/24/2020 tx empirically for PCP pneumonia with Bactrim and Prednisone CT chest=>enlarged hilar lymph nodes ? Sarcoidosis   Admission 5/28-07/16/2018 tx w Vanco, cefepime, zithromax for pneumonia Bronchoscopy 6/5=> bordetella bronchiseptica tx with zithromax Hematology  consulted for thrombocytopenia (not TTP)  Bone marrow biopsy 5/29 nonspecific HGSIL => needs outpatient follow up"     08/30/2018  f/u ov/[facility] re: post hosp f/u feels  better than at d/c  Chief Complaint  Patient presents with  . Follow-up    Breathing is doing well and she is coughing less.   Dyspnea:  More limited by fatigue/ leg weakness  Cough: some at hs dry / no excess am mucus Sleeping: ok flat/ one pillow  SABA use: none  02: none Prednisone down to 20 mg daily per oncology  Appetite is good having shrimp for supper, "just fruit" for lunch   No obvious day to day or daytime variability or assoc excess/ purulent sputum or mucus plugs or hemoptysis or cp or chest tightness, subjective wheeze or overt sinus or hb symptoms.   Sleeping fine now  without nocturnal  or early am exacerbation  of respiratory  c/o's or need for noct saba. Also denies any obvious fluctuation of symptoms with weather or environmental changes or other aggravating or alleviating factors except as outlined above   No unusual exposure hx or h/o childhood pna/ asthma or knowledge of premature birth.  Current Allergies, Complete Past Medical History, Past Surgical History, Family History, and Social History were reviewed in [employer] record.  ROS  The following are not active complaints unless bolded Hoarseness, sore throat, dysphagia, dental problems, itching, sneezing,  nasal congestion or discharge of excess mucus or purulent secretions, ear ache,   fever, chills, sweats, unintended wt loss or wt gain, classically pleuritic or exertional cp,  orthopnea pnd or arm/hand swelling  or leg  swelling, presyncope, palpitations, abdominal pain, anorexia, nausea, vomiting, diarrhea  or change in bowel habits or change in bladder habits, change in stools or change in urine, dysuria, hematuria,  rash, arthralgias, visual complaints, headache, numbness, weakness or ataxia or problems with walking  or coordination,  change in mood or  memory.        Current Meds  Medication Sig  . acetaminophen (TYLENOL) 325 MG tablet Take 650 mg by mouth every 6 (six) hours as needed for mild pain or headache.  Marland Kitchen atovaquone (MEPRON) 750 MG/5ML suspension Take 10 mLs (1,500 mg total) by mouth daily.  . B Complex-C (B-COMPLEX WITH VITAMIN C) tablet Take 1 tablet by mouth daily.  . benzonatate (TESSALON) 100 MG capsule Take 100-200 mg by mouth 2 (two) times daily as needed for cough.  . bictegravir-emtricitabine-tenofovir AF (BIKTARVY) 50-200-25 MG TABS tablet Take 1 tablet by mouth daily.  . folic acid (FOLVITE) 1 MG tablet Take 2 tablets (2 mg total) by mouth daily.  . ondansetron (ZOFRAN) 4 MG tablet Take 1 tablet (4 mg total) by mouth every 6 (six) hours.  . predniSONE (DELTASONE) 10 MG tablet Take 3 tablets (30 mg total) by mouth daily with breakfast for 14 days. Please follow up with your oncologist as an outpatient for a taper (do not stop abruptly) (Patient taking differently: Take 20 mg by mouth daily with breakfast. Please follow up with your oncologist as an outpatient for a taper (do not stop abruptly))                     Past Medical History:  Diagnosis Date  . Acute hyponatremia 06/03/2017  . Anemia   . CAP (community acquired pneumonia) 06/03/2017  . Community acquired pneumonia 06/05/2017  . Facial dermatitis 06/03/2017  . HIV (human immunodeficiency virus infection) ([place])   . Pleurisy 06/03/2017  . Pneumonia 06/06/2017  . 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 ([place])   . Thrush of mouth and esophagus ([place])   . UTI (urinary tract infection)        Objective:      amb bf nad   08/30/2018       115   06/18/18 125 lb (56.7 kg)  06/10/18 112 lb (50.8 kg)  06/05/18 120 lb (54.4 kg)    Vital signs reviewed - Note on arrival 02 sats  100% on RA / note bp   7/40 and pulse 137 s orthostatic symptoms  HEENT: nl dentition, turbinates bilaterally, and oropharynx. Nl  external ear canals without cough reflex   NECK :  without JVD/Nodes/TM/ nl carotid upstrokes bilaterally   LUNGS: no acc muscle use,  Nl contour chest which is clear to A and P bilaterally without cough on insp or exp maneuvers   CV:  RRR  no s3 or murmur or increase in P2, and no edema   ABD:  soft and nontender with nl inspiratory excursion in the supine position. No bruits or organomegaly appreciated, bowel sounds nl  MS:  Nl gait/ ext warm without deformities, calf tenderness, cyanosis or clubbing No obvious joint restrictions   SKIN: warm and dry without lesions    NEURO:  alert, approp, nl sensorium with  no motor or cerebellar deficits apparent.      I personally reviewed images and agree with radiology impression as follows:  CXR:  08/12/2018 Interval improvement of the reticular pulmonary densities since the prior CT and radiograph. No focal consolidation.  Labs reviewed 08/30/2018  LDH down to 228 08/29/18 (had been > 400 during admit)    Lab Results  Component Value Date   HGB 8.2 (L) 08/29/2018   HGB 10.1 (L) 08/22/2018   HGB 10.3 (L) 08/21/2018   HGB 10.7 (L) 08/20/2018       Lab Results  Component Value Date   PLT 26 (L) 08/29/2018   PLT 25 (LL) 08/22/2018   PLT 19 (LL) 08/21/2018   PLT 20 (LL) 08/20/2018       Assessment

## 2018-08-31 ENCOUNTER — Encounter: Payer: Self-pay | Admitting: Internal Medicine

## 2018-08-31 NOTE — Assessment & Plan Note (Signed)
-   Hospital admission 5/28-6/9 for fever, pulmonary infiltrates and thrombocytopenia - Bronchcospopy positive for few Bordetella bronchiseptica - Dx Atypical infection with Bordetella bronchiseptica vs sarcoid - 07/30/2018 Re-occurrence of fever and shortness of breath x 3 days. Sending in RX for azithromycin (zpack). Continue prednisone 46m qd x 10 days; 261mx 10 days; 1028m 10 days (slow taper) > no better see admit 08/01/18  with high LDH with diffuse infiltrates minimal crackles  resp to rx with steroids and PCP rx per ID  >>>> The absence of an impressive lung exam to go along with the diffuse infiltrates is typical of pcp but can also be seen in sarcoid and HSP,  All three of which respond to steroids, with neg fob against the latter two and note no BAL was done for pcp at that point so by exclusion of the latter two I favor the first but agree with ID it's unlikely to explain her hematologic problem which remains limiting at this point esp in terms of offering a lung bx which would be problematic with plt < 30 k

## 2018-08-31 NOTE — Assessment & Plan Note (Signed)
Resolved

## 2018-08-31 NOTE — Assessment & Plan Note (Signed)
Adding considerably to her low bp/ tachycardia > amazingly minimally symptomatic likely related to chronicity > f/u per Heme/onc favor repeat bm for ID studies over FOB repeat at this point, though we could certainly consider bal early in next admit if indicated (s bx)

## 2018-08-31 NOTE — Assessment & Plan Note (Addendum)
Severe, limiting from doing any kind of pulmonary procedure as outpt/ weaning pred per hematology   Discussed in detail all the  indications, usual  risks and alternatives  relative to the benefits with patient who agrees to proceed with conservative f/u as outlined     I had an extended discussion with the patient reviewing all relevant studies completed to date and  lasting 25 minutes of a 40  minute post hosp f/u ov visit addressing severe non-specific but potentially very serious refractory respiratory symptoms of uncertain and potentially multiple  etiologies.   Each maintenance medication was reviewed in detail including most importantly the difference between maintenance and prns and under what circumstances the prns are to be triggered using an action plan format that is not reflected in the computer generated alphabetically organized AVS.    Please see AVS for specific instructions unique to this office visit that I personally wrote and verbalized to the the pt in detail and then reviewed with pt  by my nurse highlighting any changes in therapy/plan of care  recommended at today's visit.

## 2018-09-02 DIAGNOSIS — D696 Thrombocytopenia, unspecified: Secondary | ICD-10-CM | POA: Diagnosis not present

## 2018-09-02 DIAGNOSIS — B20 Human immunodeficiency virus [HIV] disease: Secondary | ICD-10-CM | POA: Diagnosis not present

## 2018-09-02 DIAGNOSIS — B59 Pneumocystosis: Secondary | ICD-10-CM | POA: Diagnosis not present

## 2018-09-03 ENCOUNTER — Ambulatory Visit: Payer: 59

## 2018-09-03 ENCOUNTER — Encounter (HOSPITAL_COMMUNITY): Payer: Self-pay

## 2018-09-03 ENCOUNTER — Other Ambulatory Visit: Payer: Self-pay

## 2018-09-03 ENCOUNTER — Emergency Department (HOSPITAL_COMMUNITY): Payer: 59

## 2018-09-03 ENCOUNTER — Inpatient Hospital Stay (HOSPITAL_COMMUNITY)
Admission: EM | Admit: 2018-09-03 | Discharge: 2018-09-13 | DRG: 974 | Disposition: A | Discharge status: Home / Self Care | Payer: 59 | Attending: Family Medicine | Admitting: Family Medicine

## 2018-09-03 DIAGNOSIS — D759 Disease of blood and blood-forming organs, unspecified: Secondary | ICD-10-CM | POA: Diagnosis not present

## 2018-09-03 DIAGNOSIS — R531 Weakness: Secondary | ICD-10-CM | POA: Diagnosis not present

## 2018-09-03 DIAGNOSIS — D7589 Other specified diseases of blood and blood-forming organs: Secondary | ICD-10-CM | POA: Diagnosis not present

## 2018-09-03 DIAGNOSIS — Z825 Family history of asthma and other chronic lower respiratory diseases: Secondary | ICD-10-CM | POA: Diagnosis not present

## 2018-09-03 DIAGNOSIS — D649 Anemia, unspecified: Secondary | ICD-10-CM | POA: Diagnosis not present

## 2018-09-03 DIAGNOSIS — E871 Hypo-osmolality and hyponatremia: Secondary | ICD-10-CM | POA: Diagnosis present

## 2018-09-03 DIAGNOSIS — B20 Human immunodeficiency virus [HIV] disease: Secondary | ICD-10-CM | POA: Diagnosis not present

## 2018-09-03 DIAGNOSIS — R05 Cough: Secondary | ICD-10-CM | POA: Diagnosis not present

## 2018-09-03 DIAGNOSIS — E43 Unspecified severe protein-calorie malnutrition: Secondary | ICD-10-CM | POA: Diagnosis present

## 2018-09-03 DIAGNOSIS — Z21 Asymptomatic human immunodeficiency virus [HIV] infection status: Secondary | ICD-10-CM | POA: Diagnosis not present

## 2018-09-03 DIAGNOSIS — D61818 Other pancytopenia: Secondary | ICD-10-CM

## 2018-09-03 DIAGNOSIS — A419 Sepsis, unspecified organism: Principal | ICD-10-CM | POA: Diagnosis present

## 2018-09-03 DIAGNOSIS — I959 Hypotension, unspecified: Secondary | ICD-10-CM | POA: Diagnosis present

## 2018-09-03 DIAGNOSIS — D869 Sarcoidosis, unspecified: Secondary | ICD-10-CM | POA: Diagnosis present

## 2018-09-03 DIAGNOSIS — B081 Molluscum contagiosum: Secondary | ICD-10-CM | POA: Diagnosis not present

## 2018-09-03 DIAGNOSIS — R161 Splenomegaly, not elsewhere classified: Secondary | ICD-10-CM | POA: Diagnosis present

## 2018-09-03 DIAGNOSIS — D6959 Other secondary thrombocytopenia: Secondary | ICD-10-CM | POA: Diagnosis present

## 2018-09-03 DIAGNOSIS — Z20828 Contact with and (suspected) exposure to other viral communicable diseases: Secondary | ICD-10-CM | POA: Diagnosis present

## 2018-09-03 DIAGNOSIS — R509 Fever, unspecified: Secondary | ICD-10-CM | POA: Diagnosis not present

## 2018-09-03 DIAGNOSIS — F329 Major depressive disorder, single episode, unspecified: Secondary | ICD-10-CM | POA: Diagnosis not present

## 2018-09-03 DIAGNOSIS — Z888 Allergy status to other drugs, medicaments and biological substances status: Secondary | ICD-10-CM

## 2018-09-03 DIAGNOSIS — Z7952 Long term (current) use of systemic steroids: Secondary | ICD-10-CM | POA: Diagnosis not present

## 2018-09-03 DIAGNOSIS — Z79899 Other long term (current) drug therapy: Secondary | ICD-10-CM

## 2018-09-03 DIAGNOSIS — J189 Pneumonia, unspecified organism: Secondary | ICD-10-CM

## 2018-09-03 DIAGNOSIS — Z87891 Personal history of nicotine dependence: Secondary | ICD-10-CM

## 2018-09-03 DIAGNOSIS — D696 Thrombocytopenia, unspecified: Secondary | ICD-10-CM | POA: Diagnosis not present

## 2018-09-03 DIAGNOSIS — Z9889 Other specified postprocedural states: Secondary | ICD-10-CM | POA: Diagnosis not present

## 2018-09-03 DIAGNOSIS — R0602 Shortness of breath: Secondary | ICD-10-CM | POA: Diagnosis not present

## 2018-09-03 LAB — CBC WITH DIFFERENTIAL/PLATELET
Abs Immature Granulocytes: 1.59 10*3/uL — ABNORMAL HIGH (ref 0.00–0.07)
Basophils Absolute: 0 10*3/uL (ref 0.0–0.1)
Basophils Relative: 0 %
Eosinophils Absolute: 0 10*3/uL (ref 0.0–0.5)
Eosinophils Relative: 0 %
HCT: 16.6 % — ABNORMAL LOW (ref 36.0–46.0)
Hemoglobin: 5.3 g/dL — CL (ref 12.0–15.0)
Immature Granulocytes: 9 %
Lymphocytes Relative: 9 %
Lymphs Abs: 1.5 10*3/uL (ref 0.7–4.0)
MCH: 29.3 pg (ref 26.0–34.0)
MCHC: 31.9 g/dL (ref 30.0–36.0)
MCV: 91.7 fL (ref 80.0–100.0)
Monocytes Absolute: 1.6 10*3/uL — ABNORMAL HIGH (ref 0.1–1.0)
Monocytes Relative: 9 %
Neutro Abs: 13.2 10*3/uL — ABNORMAL HIGH (ref 1.7–7.7)
Neutrophils Relative %: 73 %
Platelets: 16 10*3/uL — CL (ref 150–400)
RBC: 1.81 MIL/uL — ABNORMAL LOW (ref 3.87–5.11)
RDW: 20.1 % — ABNORMAL HIGH (ref 11.5–15.5)
WBC: 18 10*3/uL — ABNORMAL HIGH (ref 4.0–10.5)
nRBC: 0.6 % — ABNORMAL HIGH (ref 0.0–0.2)

## 2018-09-03 LAB — COMPREHENSIVE METABOLIC PANEL
ALT: 14 U/L (ref 0–44)
AST: 23 U/L (ref 15–41)
Albumin: 1.7 g/dL — ABNORMAL LOW (ref 3.5–5.0)
Alkaline Phosphatase: 143 U/L — ABNORMAL HIGH (ref 38–126)
Anion gap: 10 (ref 5–15)
BUN: 17 mg/dL (ref 6–20)
CO2: 17 mmol/L — ABNORMAL LOW (ref 22–32)
Calcium: 7.1 mg/dL — ABNORMAL LOW (ref 8.9–10.3)
Chloride: 100 mmol/L (ref 98–111)
Creatinine, Ser: 0.89 mg/dL (ref 0.44–1.00)
GFR calc Af Amer: >60 mL/min (ref >60)
GFR calc non Af Amer: >60 mL/min (ref >60)
Glucose, Bld: 133 mg/dL — ABNORMAL HIGH (ref 70–99)
Potassium: 4.7 mmol/L (ref 3.5–5.1)
Sodium: 127 mmol/L — ABNORMAL LOW (ref 135–145)
Total Bilirubin: 1.7 mg/dL — ABNORMAL HIGH (ref 0.3–1.2)
Total Protein: 6 g/dL — ABNORMAL LOW (ref 6.5–8.1)

## 2018-09-03 LAB — URINALYSIS, ROUTINE W REFLEX MICROSCOPIC
Bilirubin Urine: NEGATIVE
Glucose, UA: NEGATIVE mg/dL
Hgb urine dipstick: NEGATIVE
Ketones, ur: NEGATIVE mg/dL
Leukocytes,Ua: NEGATIVE
Nitrite: NEGATIVE
Protein, ur: NEGATIVE mg/dL
Specific Gravity, Urine: 1.013 (ref 1.005–1.030)
pH: 5 (ref 5.0–8.0)

## 2018-09-03 LAB — SODIUM, URINE, RANDOM: Sodium, Ur: <10 mmol/L

## 2018-09-03 LAB — LACTIC ACID, PLASMA: Lactic Acid, Venous: 1.4 mmol/L (ref 0.5–1.9)

## 2018-09-03 LAB — APTT: aPTT: 39 seconds — ABNORMAL HIGH (ref 24–36)

## 2018-09-03 LAB — PROTIME-INR
INR: 1.4 — ABNORMAL HIGH (ref 0.8–1.2)
Prothrombin Time: 16.5 seconds — ABNORMAL HIGH (ref 11.4–15.2)

## 2018-09-03 LAB — SARS CORONAVIRUS 2 BY RT PCR (HOSPITAL ORDER, PERFORMED IN ~~LOC~~ HOSPITAL LAB): SARS Coronavirus 2: NEGATIVE

## 2018-09-03 LAB — PREPARE RBC (CROSSMATCH)

## 2018-09-03 LAB — HCG, QUANTITATIVE, PREGNANCY: hCG, Beta Chain, Quant, S: 1 m[IU]/mL (ref <5)

## 2018-09-03 MED ORDER — VANCOMYCIN HCL 10 G IV SOLR
1250.0000 mg | Freq: Once | INTRAVENOUS | Status: AC
  Administered 2018-09-03: 18:00:00 1250 mg via INTRAVENOUS
  Filled 2018-09-03 (×2): qty 1250

## 2018-09-03 MED ORDER — B COMPLEX-C PO TABS
1.0000 | ORAL_TABLET | Freq: Every day | ORAL | Status: DC
  Administered 2018-09-04 – 2018-09-13 (×10): 1 via ORAL
  Filled 2018-09-03 (×10): qty 1

## 2018-09-03 MED ORDER — SODIUM CHLORIDE 0.9% IV SOLUTION: Freq: Once | INTRAVENOUS | Status: DC

## 2018-09-03 MED ORDER — FOLIC ACID 1 MG PO TABS
2.0000 mg | ORAL_TABLET | Freq: Every day | ORAL | Status: DC
  Administered 2018-09-04 – 2018-09-13 (×10): 2 mg via ORAL
  Filled 2018-09-03 (×10): qty 2

## 2018-09-03 MED ORDER — CHLORHEXIDINE GLUCONATE CLOTH 2 % EX PADS
6.0000 | MEDICATED_PAD | Freq: Every day | CUTANEOUS | Status: DC
  Administered 2018-09-04 – 2018-09-10 (×7): 6 via TOPICAL

## 2018-09-03 MED ORDER — SODIUM CHLORIDE 0.9 % IV SOLN
500.0000 mg | Freq: Every day | INTRAVENOUS | Status: DC
  Administered 2018-09-03: 500 mg via INTRAVENOUS
  Filled 2018-09-03: qty 500

## 2018-09-03 MED ORDER — SODIUM CHLORIDE 0.9% FLUSH: 10.0000 mL | INTRAVENOUS | Status: DC | PRN

## 2018-09-03 MED ORDER — PREDNISONE 20 MG PO TABS: 20.0000 mg | ORAL_TABLET | Freq: Every day | ORAL | Status: DC

## 2018-09-03 MED ORDER — SODIUM CHLORIDE 0.9 % IV SOLN
2.0000 g | Freq: Three times a day (TID) | INTRAVENOUS | Status: DC
  Administered 2018-09-04 (×2): 2 g via INTRAVENOUS
  Filled 2018-09-03 (×3): qty 2

## 2018-09-03 MED ORDER — HYDROCODONE-ACETAMINOPHEN 5-325 MG PO TABS
1.0000 | ORAL_TABLET | ORAL | Status: DC | PRN
  Administered 2018-09-03 – 2018-09-12 (×11): 1 via ORAL
  Filled 2018-09-03 (×4): qty 1
  Filled 2018-09-03: qty 2
  Filled 2018-09-03 (×6): qty 1

## 2018-09-03 MED ORDER — SODIUM CHLORIDE 0.9 % IV SOLN
250.0000 mL | INTRAVENOUS | Status: DC | PRN
  Administered 2018-09-04: 250 mL via INTRAVENOUS

## 2018-09-03 MED ORDER — VANCOMYCIN HCL IN DEXTROSE 1-5 GM/200ML-% IV SOLN: 1000.0000 mg | Freq: Once | INTRAVENOUS | Status: DC

## 2018-09-03 MED ORDER — SODIUM CHLORIDE 0.9 % IV BOLUS (SEPSIS): 250.0000 mL | Freq: Once | INTRAVENOUS | Status: DC

## 2018-09-03 MED ORDER — SODIUM CHLORIDE 0.9% FLUSH
3.0000 mL | Freq: Two times a day (BID) | INTRAVENOUS | Status: DC
  Administered 2018-09-05 – 2018-09-09 (×2): 3 mL via INTRAVENOUS

## 2018-09-03 MED ORDER — PREDNISONE 20 MG PO TABS
40.0000 mg | ORAL_TABLET | Freq: Two times a day (BID) | ORAL | Status: DC
  Administered 2018-09-03 – 2018-09-04 (×2): 40 mg via ORAL
  Filled 2018-09-03 (×2): qty 2

## 2018-09-03 MED ORDER — POLYETHYLENE GLYCOL 3350 17 G PO PACK: 17.0000 g | PACK | Freq: Every day | ORAL | Status: DC | PRN

## 2018-09-03 MED ORDER — PRIMAQUINE PHOSPHATE 26.3 MG PO TABS
30.0000 mg | ORAL_TABLET | Freq: Every day | ORAL | Status: DC
  Administered 2018-09-03 – 2018-09-04 (×2): 30 mg via ORAL
  Filled 2018-09-03 (×2): qty 2

## 2018-09-03 MED ORDER — SODIUM CHLORIDE 0.9% FLUSH
3.0000 mL | INTRAVENOUS | Status: DC | PRN
  Administered 2018-09-09 – 2018-09-13 (×2): 3 mL via INTRAVENOUS
  Filled 2018-09-03 (×2): qty 3

## 2018-09-03 MED ORDER — ACETAMINOPHEN 650 MG RE SUPP: 650.0000 mg | Freq: Four times a day (QID) | RECTAL | Status: DC | PRN

## 2018-09-03 MED ORDER — SODIUM CHLORIDE 0.9 % IV BOLUS (SEPSIS)
500.0000 mL | Freq: Once | INTRAVENOUS | Status: AC
  Administered 2018-09-03: 500 mL via INTRAVENOUS

## 2018-09-03 MED ORDER — SODIUM CHLORIDE 0.9% FLUSH
3.0000 mL | Freq: Two times a day (BID) | INTRAVENOUS | Status: DC
  Administered 2018-09-03 – 2018-09-12 (×14): 3 mL via INTRAVENOUS

## 2018-09-03 MED ORDER — SODIUM CHLORIDE 0.9 % IV BOLUS (SEPSIS)
1000.0000 mL | Freq: Once | INTRAVENOUS | Status: AC
  Administered 2018-09-03: 1000 mL via INTRAVENOUS

## 2018-09-03 MED ORDER — SODIUM CHLORIDE 0.9 % IV SOLN
10.0000 mL/h | Freq: Once | INTRAVENOUS | Status: AC
  Administered 2018-09-03: 10 mL/h via INTRAVENOUS

## 2018-09-03 MED ORDER — CLINDAMYCIN PHOSPHATE 900 MG/50ML IV SOLN
900.0000 mg | Freq: Three times a day (TID) | INTRAVENOUS | Status: DC
  Administered 2018-09-03 – 2018-09-04 (×2): 900 mg via INTRAVENOUS
  Filled 2018-09-03 (×2): qty 50

## 2018-09-03 MED ORDER — METRONIDAZOLE IN NACL 5-0.79 MG/ML-% IV SOLN
500.0000 mg | Freq: Once | INTRAVENOUS | Status: AC
  Administered 2018-09-03: 500 mg via INTRAVENOUS
  Filled 2018-09-03: qty 100

## 2018-09-03 MED ORDER — SODIUM CHLORIDE 0.9% FLUSH
10.0000 mL | Freq: Two times a day (BID) | INTRAVENOUS | Status: DC
  Administered 2018-09-03 – 2018-09-10 (×7): 10 mL

## 2018-09-03 MED ORDER — BICTEGRAVIR-EMTRICITAB-TENOFOV 50-200-25 MG PO TABS
1.0000 | ORAL_TABLET | Freq: Every day | ORAL | Status: DC
  Administered 2018-09-04 – 2018-09-13 (×10): 1 via ORAL
  Filled 2018-09-03 (×11): qty 1

## 2018-09-03 MED ORDER — ACETAMINOPHEN 325 MG PO TABS
650.0000 mg | ORAL_TABLET | Freq: Four times a day (QID) | ORAL | Status: DC | PRN
  Administered 2018-09-05 – 2018-09-08 (×4): 650 mg via ORAL
  Filled 2018-09-03 (×4): qty 2

## 2018-09-03 MED ORDER — PRIMAQUINE PHOSPHATE 26.3 MG PO TABS: 30.0000 mg | ORAL_TABLET | Freq: Every day | ORAL | Status: DC

## 2018-09-03 MED ORDER — SODIUM CHLORIDE 0.9 % IV SOLN
2.0000 g | Freq: Once | INTRAVENOUS | Status: AC
  Administered 2018-09-03: 2 g via INTRAVENOUS
  Filled 2018-09-03: qty 2

## 2018-09-03 NOTE — ED Notes (Addendum)
Critical Value:  Hemoglobin- 5.3 Platelet count- 16  Zackowski made aware

## 2018-09-03 NOTE — ED Notes (Signed)
Advised [name] RN unable to complete EKG due to current tasks with Rm4 and other pts in assignment. RN observed in Rm6 with pt for 7min approx prior to request. Krystal Short

## 2018-09-03 NOTE — ED Triage Notes (Signed)
Patient arrived via POV with husband dropping patient off. Patient is AOx4 and ambulatory at baseline. Patient is HIV Positive and is taking medication regularly. Patient chief complaint is Low blood pressure and rapid heart rate. Patient has generalized pain all over body.

## 2018-09-03 NOTE — ED Notes (Signed)
Patient is resting comfortably. 

## 2018-09-03 NOTE — ED Notes (Signed)
NT made aware EKG is needed.

## 2018-09-03 NOTE — ED Provider Notes (Signed)
[facility] DEPT Provider Note   CSN: [id number] Arrival date & time: 09/03/18  1326     History   Chief Complaint Chief Complaint  Patient presents with   Hypotension   Tachycardia    HPI Talana Darwin is a 33 y.o. female.     Patient brought in by her husband.  Patient is HIV positive and taking medication regularly.  Of chief complaint with arrival was low blood pressure and rapid heart rate.  And generalized pain all over the body.  Patient was admitted the end of June for sepsis.  Atypical pneumonia.  Also required blood transfusions for anemia and low platelets at that time.  Patient feels as if that has come back.  Patient's initial systolic blood pressure here is 70.  Patient met sepsis criteria.  Sepsis protocol will be initiated.     Past Medical History:  Diagnosis Date   Acute hyponatremia 06/03/2017   Anemia    CAP (community acquired pneumonia) 06/03/2017   Community acquired pneumonia 06/05/2017   Facial dermatitis 06/03/2017   HIV (human immunodeficiency virus infection) ([place])    Pleurisy 06/03/2017   Pneumonia 06/06/2017   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 ([place])    Thrush of mouth and esophagus ([facility])    UTI (urinary tract infection)     Patient Active Problem List   Diagnosis Date Noted   Pancytopenia, acquired ([place])    Elevated bilirubin    AKI (acute kidney injury) ([place])    Acute respiratory failure with hypoxemia ([place]) 08/03/2018   Chest pain    Fever 08/02/2018   HCAP (healthcare-associated pneumonia) 08/02/2018   Sepsis ([place]) 08/02/2018   Hemophagocytosis present in bone marrow ([place])    Pulmonary infiltrates 07/08/2018   Hilar adenopathy 07/08/2018   Acute respiratory failure with hypoxia ([place]) 07/04/2018   Symptomatic anemia 06/10/2018   Thrombocytopenia ([place]) 06/10/2018   ASCUS with positive high risk HPV cervical 06/10/2018   Suspected pneumocystis pneumonia  ([place]) 03/08/2018   Medication monitoring encounter 12/31/2017   Molluscum contagiosum 06/27/2017   AIDS (acquired immune deficiency syndrome) ([place])    Esophageal candidiasis ([place])    Hyponatremia 06/03/2017    Past Surgical History:  Procedure Laterality Date   BIOPSY  07/12/2018   Procedure: BIOPSY;  Surgeon: Laurin Coder, MD;  Location: [facility];  Service: Endoscopy;;   BRONCHIAL WASHINGS  07/12/2018   Procedure: BRONCHIAL WASHINGS;  Surgeon: Laurin Coder, MD;  Location: [facility];  Service: Endoscopy;;   ENDOBRONCHIAL ULTRASOUND N/A 07/12/2018   Procedure: ENDOBRONCHIAL ULTRASOUND;  Surgeon: Laurin Coder, MD;  Location: [facility];  Service: Endoscopy;  Laterality: N/A;   FINE NEEDLE ASPIRATION BIOPSY  07/12/2018   Procedure: FINE NEEDLE ASPIRATION BIOPSY;  Surgeon: Laurin Coder, MD;  Location: [facility];  Service: Endoscopy;;   NO PAST SURGERIES     VIDEO BRONCHOSCOPY N/A 07/12/2018   Procedure: VIDEO BRONCHOSCOPY WITHOUT FLUORO;  Surgeon: Laurin Coder, MD;  Location: [facility];  Service: Endoscopy;  Laterality: N/A;     OB History    Gravida  0   Para  0   Term  0   Preterm  0   AB  0   Living  0     SAB  0   TAB  0   Ectopic  0   Multiple  0   Live Births  0            Home Medications  Prior to Admission medications   Medication Sig Start Date End Date Taking? Authorizing Provider  acetaminophen (TYLENOL) 325 MG tablet Take 650 mg by mouth every 6 (six) hours as needed for mild pain or headache.   Yes [provider]  atovaquone (MEPRON) 750 MG/5ML suspension Take 10 mLs (1,500 mg total) by mouth daily. 08/23/18 09/22/18 Yes Elodia Florence., MD  B Complex-C (B-COMPLEX WITH VITAMIN C) tablet Take 1 tablet by mouth daily. 08/23/18 09/22/18 Yes Elodia Florence., MD  bictegravir-emtricitabine-tenofovir AF (BIKTARVY) 50-200-25 MG TABS tablet Take 1 tablet by mouth daily. 06/20/18  Yes  [place], FNP  folic acid (FOLVITE) 1 MG tablet Take 2 tablets (2 mg total) by mouth daily. 08/23/18 09/22/18 Yes Elodia Florence., MD  ondansetron (ZOFRAN) 4 MG tablet Take 1 tablet (4 mg total) by mouth every 6 (six) hours. 05/24/18  Yes Raylene Everts, MD  predniSONE (DELTASONE) 10 MG tablet Take 3 tablets (30 mg total) by mouth daily with breakfast for 14 days. Please follow up with your oncologist as an outpatient for a taper (do not stop abruptly) Patient taking differently: Take 20 mg by mouth daily with breakfast. Please follow up with your oncologist as an outpatient for a taper (do not stop abruptly) 08/23/18 09/06/18 Yes Elodia Florence., MD    Family History Family History  Problem Relation Age of Onset   Healthy Father    Healthy Mother    Asthma Paternal Grandmother     Social History Social History   Tobacco Use   Smoking status: Former Smoker    Packs/day: 0.25    Years: 8.00    Pack years: 2.00    Types: Cigarettes, Cigars    Quit date: 12/25/2016    Years since quitting: 1.6   Smokeless tobacco: Never Used  Substance Use Topics   Alcohol use: Yes    Comment: weekend/socially   Drug use: No     Allergies   Heparin   Review of Systems Review of Systems  Constitutional: Negative for chills and fever.  HENT: Negative for congestion, rhinorrhea and sore throat.   Eyes: Negative for visual disturbance.  Respiratory: Negative for cough and shortness of breath.   Cardiovascular: Positive for palpitations. Negative for chest pain and leg swelling.  Gastrointestinal: Negative for abdominal pain, diarrhea, nausea and vomiting.  Genitourinary: Negative for dysuria.  Musculoskeletal: Negative for back pain and neck pain.  Skin: Negative for rash.  Neurological: Positive for light-headedness. Negative for dizziness and headaches.  Hematological: Does not bruise/bleed easily.  Psychiatric/Behavioral: Negative for confusion.      Physical Exam Updated Vital Signs BP 95/68 (BP Location: Right Arm)    Pulse (!) 112    Temp 98.6 F (37 C) (Oral)    Resp (!) 36    Ht 1.537 m (5' 0.5")    Wt 52.5 kg    SpO2 99%    BMI 22.24 kg/m   Physical Exam Vitals signs and nursing note reviewed.  Constitutional:      General: She is in acute distress.     Appearance: She is well-developed. She is ill-appearing.  HENT:     Head: Normocephalic and atraumatic.  Eyes:     Extraocular Movements: Extraocular movements intact.     Conjunctiva/sclera: Conjunctivae normal.     Pupils: Pupils are equal, round, and reactive to light.  Neck:     Musculoskeletal: Normal range of motion and neck supple.  Cardiovascular:  Rate and Rhythm: Normal rate and regular rhythm.     Heart sounds: No murmur.  Pulmonary:     Effort: Pulmonary effort is normal. No respiratory distress.     Breath sounds: Normal breath sounds.  Abdominal:     Palpations: Abdomen is soft.     Tenderness: There is no abdominal tenderness.  Musculoskeletal: Normal range of motion.  Skin:    General: Skin is warm and dry.  Neurological:     General: No focal deficit present.     Mental Status: She is alert and oriented to person, place, and time.     Cranial Nerves: No cranial nerve deficit.     Motor: No weakness.      ED Treatments / Results  Labs (all labs ordered are listed, but only abnormal results are displayed) Labs Reviewed  COMPREHENSIVE METABOLIC PANEL - Abnormal; Notable for the following components:      Result Value   Sodium 127 (*)    CO2 17 (*)    Glucose, Bld 133 (*)    Calcium 7.1 (*)    Total Protein 6.0 (*)    Albumin 1.7 (*)    Alkaline Phosphatase 143 (*)    Total Bilirubin 1.7 (*)    All other components within normal limits  CBC WITH DIFFERENTIAL/PLATELET - Abnormal; Notable for the following components:   WBC 18.0 (*)    RBC 1.81 (*)    Hemoglobin 5.3 (*)    HCT 16.6 (*)    RDW 20.1 (*)    Platelets 16 (*)     nRBC 0.6 (*)    Neutro Abs 13.2 (*)    Monocytes Absolute 1.6 (*)    Abs Immature Granulocytes 1.59 (*)    All other components within normal limits  PROTIME-INR - Abnormal; Notable for the following components:   Prothrombin Time 16.5 (*)    INR 1.4 (*)    All other components within normal limits  APTT - Abnormal; Notable for the following components:   aPTT 39 (*)    All other components within normal limits  SARS CORONAVIRUS 2 (HOSPITAL ORDER, [facility])  CULTURE, BLOOD (ROUTINE X 2)  CULTURE, BLOOD (ROUTINE X 2)  URINE CULTURE  LACTIC ACID, PLASMA  HCG, QUANTITATIVE, PREGNANCY  LACTIC ACID, PLASMA  URINALYSIS, ROUTINE W REFLEX MICROSCOPIC  I-STAT BETA HCG BLOOD, ED ([facility], [facility], [facility] ONLY)  TYPE AND SCREEN  PREPARE RBC (CROSSMATCH)    EKG EKG Interpretation  Date/Time:  Tuesday September 03 2018 17:28:04 EDT Ventricular Rate:  115 PR Interval:    QRS Duration: 84 QT Interval:  312 QTC Calculation: 432 R Axis:   35 Text Interpretation:  Sinus tachycardia Confirmed by Fredia Sorrow [phone]) on 09/03/2018 5:34:01 PM   Radiology Dg Chest Port 1 View  Result Date: 09/03/2018 CLINICAL DATA:  Cough and fever.  History of HIV disease EXAM: PORTABLE CHEST 1 VIEW COMPARISON:  August 12, 2018 chest radiograph; CT angiogram chest August 02, 2018 FINDINGS: There remains fine reticulonodular interstitial disease throughout the lungs, primarily in the mid and lower lung zones. The appearance is similar to most recent chest radiograph with less opacity overall compared to most recent CT. No new opacity evident. No consolidation. Heart size and pulmonary vascularity are normal. No adenopathy. No bone lesions. IMPRESSION: Fine reticulonodular interstitial opacity bilaterally, similar to most recent chest radiograph, likely of infectious etiology. Suspect multifocal atypical organism pneumonia. No consolidation. No new opacity. No adenopathy evident. Electronically  Signed   By:  Lowella Grip III M.D.   On: 09/03/2018 15:39    Procedures Procedures (including critical care time)  CRITICAL CARE Performed by: Fredia Sorrow Total critical care time: 30 minutes Critical care time was exclusive of separately billable procedures and treating other patients. Critical care was necessary to treat or prevent imminent or life-threatening deterioration. Critical care was time spent personally by me on the following activities: development of treatment plan with patient and/or surrogate as well as nursing, discussions with consultants, evaluation of patient's response to treatment, examination of patient, obtaining history from patient or surrogate, ordering and performing treatments and interventions, ordering and review of laboratory studies, ordering and review of radiographic studies, pulse oximetry and re-evaluation of patient's condition.   Medications Ordered in ED Medications  sodium chloride 0.9 % bolus 1,000 mL (0 mLs Intravenous Stopped 09/03/18 1754)    And  sodium chloride 0.9 % bolus 500 mL (500 mLs Intravenous New Bag/Given 09/03/18 1749)    And  sodium chloride 0.9 % bolus 250 mL (has no administration in time range)  vancomycin (VANCOCIN) 1,250 mg in sodium chloride 0.9 % 250 mL IVPB (1,250 mg Intravenous New Bag/Given 09/03/18 1756)  0.9 %  sodium chloride infusion (has no administration in time range)  ceFEPIme (MAXIPIME) 2 g in sodium chloride 0.9 % 100 mL IVPB (2 g Intravenous New Bag/Given 09/03/18 1749)  metroNIDAZOLE (FLAGYL) IVPB 500 mg (500 mg Intravenous New Bag/Given 09/03/18 1718)     Initial Impression / Assessment and Plan / ED Course  I have reviewed the triage vital signs and the nursing notes.  Pertinent labs & imaging results that were available during my care of the patient were reviewed by me and considered in my medical decision making (see chart for details).       Patient presenting with sepsis criteria.  Hypotensive.  Patient  started on sepsis protocol.  Patient received 30 cc/kg fluid challenge.  Blood pressure came up into the 90s from an initial systolic of 70.  Chest x-ray consistent with atypical pneumonia.  Patient has a history of HIV.  Patient was admitted recently for sepsis and atypical pneumonia.  Patient feels as if she is got the same symptoms.  Patient also required blood transfusions and anemia during that admission root cause for the blood loss is not clear.  Patient's work-up here did show significant anemia.  Patient typed and screened 2 units of blood ordered.  Patient's platelets were also low.  But platelets were not ordered at this time.  But may require platelet transfusion.  Patient was admitted for sepsis on June 25.  I discussed with the hospitalist team they will see and admit.  Patient improved with fluids.  Maintain her blood pressures above 90.  Patient started on broad-spectrum antibiotics as part of the sepsis protocol.  Patient's lactic acid was not elevated.  Patient's COVID testing was negative.   Final Clinical Impressions(s) / ED Diagnoses   Final diagnoses:  Sepsis, due to unspecified organism, unspecified whether acute organ dysfunction present ([place])  Anemia, unspecified type  Atypical pneumonia  Symptomatic HIV infection [facility])    ED Discharge Orders    None       Fredia Sorrow, MD 09/03/18 2228

## 2018-09-03 NOTE — H&P (Signed)
History and Physical    Ashira Kirsten YCX:[id number] DOB: 1985/04/19 DOA: 09/03/2018  PCP: Lajean Manes, MD   Patient coming from: Home   Chief Complaint: Generalized weakness, fatigue, low BP, high HR   HPI: Krystal Short is a 33 y.o. female with medical history significant for AIDS (last CD4 41 and VL undetectable) and recent admissions for pneumonia, now returning to the emergency department for evaluation of generalized weakness, fatigue, low blood pressure, and elevated heart rate.  Patient was admitted to the hospital last month and discharged on 08/22/2018 after treatment of recurrent atypical pneumonia complicated by pancytopenia, acute kidney injury, and hyponatremia.  During the recent admission, she was treated for suspected pneumocystis pneumonia, transfused 7 units of platelets and 8 units of RBC, and reports that she was feeling much better when she returned home.  She began to develop generalized weakness and fatigue approximately 5 days ago, progressively worsening though she seemed to feel a bit better yesterday before worsening again today.  She denies any significant cough and does not feel short of breath though her significant other reports she has been dyspneic with speech for the last couple days.  She denies any melena or hematochezia.  She had one episode of nonbloody vomiting this morning without abdominal pain or diarrhea.  Reports continued adherence with her medications.  ED Course: Upon arrival to the ED, patient is found to be afebrile, saturating well on room air, tachypneic in the 30s, tachycardic to 140, and with blood pressure 70/44.  EKG features sinus tachycardia and chest x-ray is notable for fine reticular interstitial opacities bilaterally that are similar to prior and likely infectious.  Chemistry panel notable for sodium of 127, bicarbonate 17, and albumin of 1.7.  CBC is notable for leukocytosis to 18,000, hemoglobin 5.3, and platelets 16,000.  Lactic acid is  reassuringly normal.  Urinalysis unremarkable.  Blood and urine cultures were collected, 30 cc/kg NS bolus was given, 2 units of packed red blood cells ordered, and patient was started on vancomycin, cefepime, and Flagyl.  Hospitalists are asked to admit.  Review of Systems:  All other systems reviewed and apart from HPI, are negative.  Past Medical History:  Diagnosis Date  . Acute hyponatremia 06/03/2017  . Anemia   . CAP (community acquired pneumonia) 06/03/2017  . Community acquired pneumonia 06/05/2017  . Facial dermatitis 06/03/2017  . HIV (human immunodeficiency virus infection) ([place])   . Pleurisy 06/03/2017  . Pneumonia 06/06/2017  . 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 ([place])   . Thrush of mouth and esophagus ([place])   . UTI (urinary tract infection)     Past Surgical History:  Procedure Laterality Date  . BIOPSY  07/12/2018   Procedure: BIOPSY;  Surgeon: Laurin Coder, MD;  Location: [facility];  Service: Endoscopy;;  . BRONCHIAL WASHINGS  07/12/2018   Procedure: BRONCHIAL WASHINGS;  Surgeon: Laurin Coder, MD;  Location: [facility];  Service: Endoscopy;;  . ENDOBRONCHIAL ULTRASOUND N/A 07/12/2018   Procedure: ENDOBRONCHIAL ULTRASOUND;  Surgeon: Laurin Coder, MD;  Location: [facility];  Service: Endoscopy;  Laterality: N/A;  . FINE NEEDLE ASPIRATION BIOPSY  07/12/2018   Procedure: FINE NEEDLE ASPIRATION BIOPSY;  Surgeon: Laurin Coder, MD;  Location: [facility];  Service: Endoscopy;;  . NO PAST SURGERIES    . VIDEO BRONCHOSCOPY N/A 07/12/2018   Procedure: VIDEO BRONCHOSCOPY WITHOUT FLUORO;  Surgeon: Laurin Coder, MD;  Location: [facility];  Service: Endoscopy;  Laterality: N/A;  reports that she quit smoking about 20 months ago. Her smoking use included cigarettes and cigars. She has a 2.00 pack-year smoking history. She has never used smokeless tobacco. She reports current alcohol use. She reports that she does not use drugs.   Allergies  Allergen Reactions  . Heparin Other (See Comments)    Unknown reaction    Family History  Problem Relation Age of Onset  . Healthy Father   . Healthy Mother   . Asthma Paternal Grandmother      Prior to Admission medications   Medication Sig Start Date End Date Taking? Authorizing Provider  acetaminophen (TYLENOL) 325 MG tablet Take 650 mg by mouth every 6 (six) hours as needed for mild pain or headache.   Yes [provider]  atovaquone (MEPRON) 750 MG/5ML suspension Take 10 mLs (1,500 mg total) by mouth daily. 08/23/18 09/22/18 Yes Elodia Florence., MD  B Complex-C (B-COMPLEX WITH VITAMIN C) tablet Take 1 tablet by mouth daily. 08/23/18 09/22/18 Yes Elodia Florence., MD  bictegravir-emtricitabine-tenofovir AF (BIKTARVY) 50-200-25 MG TABS tablet Take 1 tablet by mouth daily. 06/20/18  Yes Golden Circle, FNP  folic acid (FOLVITE) 1 MG tablet Take 2 tablets (2 mg total) by mouth daily. 08/23/18 09/22/18 Yes Elodia Florence., MD  ondansetron (ZOFRAN) 4 MG tablet Take 1 tablet (4 mg total) by mouth every 6 (six) hours. 05/24/18  Yes Raylene Everts, MD  predniSONE (DELTASONE) 10 MG tablet Take 3 tablets (30 mg total) by mouth daily with breakfast for 14 days. Please follow up with your oncologist as an outpatient for a taper (do not stop abruptly) Patient taking differently: Take 20 mg by mouth daily with breakfast. Please follow up with your oncologist as an outpatient for a taper (do not stop abruptly) 08/23/18 09/06/18 Yes Elodia Florence., MD    Physical Exam: Vitals:   09/03/18 1730 09/03/18 1800 09/03/18 1810 09/03/18 1906  BP:  95/68 95/68 105/75  Pulse: (!) 114 (!) 113 (!) 112 (!) 114  Resp: (!) 36 (!) 35 (!) 36 (!) 39  Temp:      TempSrc:      SpO2: 99% 99% 99% 100%  Weight:      Height:        Constitutional: NAD, calm  Eyes: PERTLA, lids and conjunctivae normal ENMT: Mucous membranes are moist. Posterior pharynx clear of any  exudate or lesions.   Neck: normal, supple, no masses, no thyromegaly Respiratory: Scattered rhonchi bilaterally, no wheezing. Tachypneic. Dyspneic with speech. No pallor or cyanosis.   Cardiovascular: Rate ~120 and regular. No extremity edema.   Abdomen: No distension, no tenderness, soft. Bowel sounds normal.  Musculoskeletal: no clubbing / cyanosis. No joint deformity upper and lower extremities.   Skin: Scattered depigmented macules. Warm, dry, well-perfused. Neurologic: CN 2-12 grossly intact. Sensation intact. Strength 5/5 in all 4 limbs.  Psychiatric:  Alert and oriented x 3. Pleasant, cooperative.    Labs on Admission: I have personally reviewed following labs and imaging studies  CBC: Recent Labs  Lab 08/29/18 1226 09/03/18 1627  WBC 5.7 18.0*  NEUTROABS 3.3 13.2*  HGB 8.2* 5.3*  HCT 26.0* 16.6*  MCV 92.2 91.7  PLT 26* 16*   Basic Metabolic Panel: Recent Labs  Lab 08/29/18 1226 09/03/18 1627  NA 131* 127*  K 4.0 4.7  CL 101 100  CO2 21* 17*  GLUCOSE 169* 133*  BUN 13 17  CREATININE 0.84 0.89  CALCIUM 8.4*  7.1*   GFR: Estimated Creatinine Clearance: 67.5 mL/min (by C-G formula based on SCr of 0.89 mg/dL). Liver Function Tests: Recent Labs  Lab 08/29/18 1226 09/03/18 1627  AST 45* 23  ALT 34 14  ALKPHOS 238* 143*  BILITOT 1.9* 1.7*  PROT 7.1 6.0*  ALBUMIN 2.1* 1.7*   No results for input(s): LIPASE, AMYLASE in the last 168 hours. No results for input(s): AMMONIA in the last 168 hours. Coagulation Profile: Recent Labs  Lab 09/03/18 1758  INR 1.4*   Cardiac Enzymes: No results for input(s): CKTOTAL, CKMB, CKMBINDEX, TROPONINI in the last 168 hours. BNP (last 3 results) No results for input(s): PROBNP in the last 8760 hours. HbA1C: No results for input(s): HGBA1C in the last 72 hours. CBG: No results for input(s): GLUCAP in the last 168 hours. Lipid Profile: No results for input(s): CHOL, HDL, LDLCALC, TRIG, CHOLHDL, LDLDIRECT in the last 72  hours. Thyroid Function Tests: No results for input(s): TSH, T4TOTAL, FREET4, T3FREE, THYROIDAB in the last 72 hours. Anemia Panel: No results for input(s): VITAMINB12, FOLATE, FERRITIN, TIBC, IRON, RETICCTPCT in the last 72 hours. Urine analysis:    Component Value Date/Time   COLORURINE AMBER (A) 09/03/2018 1627   APPEARANCEUR CLEAR 09/03/2018 1627   LABSPEC 1.013 09/03/2018 1627   PHURINE 5.0 09/03/2018 1627   GLUCOSEU NEGATIVE 09/03/2018 1627   HGBUR NEGATIVE 09/03/2018 1627   BILIRUBINUR NEGATIVE 09/03/2018 1627   KETONESUR NEGATIVE 09/03/2018 1627   PROTEINUR NEGATIVE 09/03/2018 1627   UROBILINOGEN 1.0 12/03/2011 0550   NITRITE NEGATIVE 09/03/2018 1627   LEUKOCYTESUR NEGATIVE 09/03/2018 1627   Sepsis Labs: @LABRCNTIP (procalcitonin:4,lacticidven:4) ) Recent Results (from the past 240 hour(s))  SARS Coronavirus 2 (CEPHEID- Performed in [place] hospital lab), Hosp Order     Status: None   Collection Time: 09/03/18  4:30 PM   Specimen: Nasopharyngeal Swab  Result Value Ref Range Status   SARS Coronavirus 2 NEGATIVE NEGATIVE Final    Comment: (NOTE) If result is NEGATIVE SARS-CoV-2 target nucleic acids are NOT DETECTED. The SARS-CoV-2 RNA is generally detectable in upper and lower  respiratory specimens during the acute phase of infection. The lowest  concentration of SARS-CoV-2 viral copies this assay can detect is 250  copies / mL. A negative result does not preclude SARS-CoV-2 infection  and should not be used as the sole basis for treatment or other  patient management decisions.  A negative result may occur with  improper specimen collection / handling, submission of specimen other  than nasopharyngeal swab, presence of viral mutation(s) within the  areas targeted by this assay, and inadequate number of viral copies  (<250 copies / mL). A negative result must be combined with clinical  observations, patient history, and epidemiological information. If result is  POSITIVE SARS-CoV-2 target nucleic acids are DETECTED. The SARS-CoV-2 RNA is generally detectable in upper and lower  respiratory specimens dur ing the acute phase of infection.  Positive  results are indicative of active infection with SARS-CoV-2.  Clinical  correlation with patient history and other diagnostic information is  necessary to determine patient infection status.  Positive results do  not rule out bacterial infection or co-infection with other viruses. If result is PRESUMPTIVE POSTIVE SARS-CoV-2 nucleic acids MAY BE PRESENT.   A presumptive positive result was obtained on the submitted specimen  and confirmed on repeat testing.  While 2019 novel coronavirus  (SARS-CoV-2) nucleic acids may be present in the submitted sample  additional confirmatory testing may be necessary for epidemiological  and / or clinical management purposes  to differentiate between  SARS-CoV-2 and other Sarbecovirus currently known to infect humans.  If clinically indicated additional testing with an alternate test  methodology [phone]) is advised. The SARS-CoV-2 RNA is generally  detectable in upper and lower respiratory sp ecimens during the acute  phase of infection. The expected result is Negative. Fact Sheet for Patients:  StrictlyIdeas.no Fact Sheet for Healthcare Providers: BankingDealers.co.za This test is not yet approved or cleared by the [place] FDA and has been authorized for detection and/or diagnosis of SARS-CoV-2 by FDA under an Emergency Use Authorization (EUA).  This EUA will remain in effect (meaning this test can be used) for the duration of the COVID-19 declaration under Section 564(b)(1) of the Act, 21 U.S.C. section 360bbb-3(b)(1), unless the authorization is terminated or revoked sooner. Performed at [facility], [place] [address] [place], [place] [address]      Radiological Exams on Admission: Dg  Chest Port 1 View  Result Date: 09/03/2018 CLINICAL DATA:  Cough and fever.  History of HIV disease EXAM: PORTABLE CHEST 1 VIEW COMPARISON:  August 12, 2018 chest radiograph; CT angiogram chest August 02, 2018 FINDINGS: There remains fine reticulonodular interstitial disease throughout the lungs, primarily in the mid and lower lung zones. The appearance is similar to most recent chest radiograph with less opacity overall compared to most recent CT. No new opacity evident. No consolidation. Heart size and pulmonary vascularity are normal. No adenopathy. No bone lesions. IMPRESSION: Fine reticulonodular interstitial opacity bilaterally, similar to most recent chest radiograph, likely of infectious etiology. Suspect multifocal atypical organism pneumonia. No consolidation. No new opacity. No adenopathy evident. Electronically Signed   By: Lowella Grip III M.D.   On: 09/03/2018 15:39    EKG: Independently reviewed. Sinus tachycardia (rate 115), QTc 432 ms.   Assessment/Plan   1. Sepsis secondary to PNA  - Presents with fatigue and gen weakness, found to be hypotensive and tachycardic with reassuringly normal lactate and CXR concerning for atypical PNA  - Blood and urine cultured in ED, 30 cc/kg NS bolus given, and she was started on broad-spectrum antibiotics empirically  - CMV was positive during recent admission but not felt to be etiology of her acute illness and remainder of infectious and autoimmune workup was negative  - Culture sputum, check respiratory virus panel, strep pneumo and legionella antigens, MRSA screen, continue prednisone, continue antibiotics with cefepime, azithromycin, clindamycin, and primaquine   2. Anemia, thrombocytopenia   - Hgb is 5.3 and platelets 16k on admission without bleeding  - Underwent extensive inpatient workup during recent admission with unclear etiology; she received 8 units RBC and platelet transfusion x7 during that admission  - RBCs and platelets have been  ordered for transfusion, check post-transfusion CBC    3. HIV/AIDS  - CD4 41 on 07/12/18 and VL undetectable on 08/15/18 - Continue Biktarvy    4. Hyponatremia  - Serum sodium is 127 on admission, down from 131 on 7/23   - She was given 30 cc/kg NS in ED  - Check urine sodium and osm, repeat chem panel in am    PPE: mask, face shield. Patient wearing mask.  DVT prophylaxis: SCD's  Code Status: Full  Family Communication: Significant other updated at bedside  Consults called: none Admission status: Inpatient     Vianne Bulls, MD [facility] Pager [phone]  If 7PM-7AM, please contact night-coverage [email or web address] Password [facility]  09/03/2018, 7:47 PM

## 2018-09-03 NOTE — ED Notes (Signed)
ED TO INPATIENT HANDOFF REPORT  ED Nurse Name and Phone #: Gara Kroner RN  S Name/Age/Gender Krystal Short 33 y.o. female Room/Bed: WA06/WA06  Code Status   Code Status: Prior  Home/SNF/Other Home Patient oriented to: self, place, time and situation Is this baseline? Yes   Triage Complete: Triage complete  Chief Complaint fever/ cough / hypotension   Triage Note Patient arrived via POV with husband dropping patient off. Patient is AOx4 and ambulatory at baseline. Patient is HIV Positive and is taking medication regularly. Patient chief complaint is Low blood pressure and rapid heart rate. Patient has generalized pain all over body.   Allergies Allergies  Allergen Reactions  . Heparin Other (See Comments)    Unknown reaction    Level of Care/Admitting Diagnosis ED Disposition    ED Disposition Condition [facility] Area: [place] [[id number]]  Level of Care: Stepdown [14]  Admit to SDU based on following criteria: Hemodynamic compromise or significant risk of instability:  Patient requiring short term acute titration and management of vasoactive drips, and invasive monitoring (i.e., CVP and Arterial line).  Covid Evaluation: Confirmed COVID Negative  Diagnosis: Sepsis due to pneumonia [facility]) [[id number]]  Admitting Physician: Vianne Bulls [[id number]]  Attending Physician: Vianne Bulls [[id number]]  Estimated length of stay: past midnight tomorrow  Certification:: I certify this patient will need inpatient services for at least 2 midnights  PT Class (Do Not Modify): Inpatient [101]  PT Acc Code (Do Not Modify): Private [1]       B Medical/Surgery History Past Medical History:  Diagnosis Date  . Acute hyponatremia 06/03/2017  . Anemia   . CAP (community acquired pneumonia) 06/03/2017  . Community acquired pneumonia 06/05/2017  . Facial dermatitis 06/03/2017  . HIV (human immunodeficiency virus infection) ([place])   . Pleurisy 06/03/2017   . Pneumonia 06/06/2017  . 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 ([place])   . Thrush of mouth and esophagus ([place])   . UTI (urinary tract infection)    Past Surgical History:  Procedure Laterality Date  . BIOPSY  07/12/2018   Procedure: BIOPSY;  Surgeon: Laurin Coder, MD;  Location: [facility];  Service: Endoscopy;;  . BRONCHIAL WASHINGS  07/12/2018   Procedure: BRONCHIAL WASHINGS;  Surgeon: Laurin Coder, MD;  Location: [facility];  Service: Endoscopy;;  . ENDOBRONCHIAL ULTRASOUND N/A 07/12/2018   Procedure: ENDOBRONCHIAL ULTRASOUND;  Surgeon: Laurin Coder, MD;  Location: [facility];  Service: Endoscopy;  Laterality: N/A;  . FINE NEEDLE ASPIRATION BIOPSY  07/12/2018   Procedure: FINE NEEDLE ASPIRATION BIOPSY;  Surgeon: Laurin Coder, MD;  Location: [facility];  Service: Endoscopy;;  . NO PAST SURGERIES    . VIDEO BRONCHOSCOPY N/A 07/12/2018   Procedure: VIDEO BRONCHOSCOPY WITHOUT FLUORO;  Surgeon: Laurin Coder, MD;  Location: [facility];  Service: Endoscopy;  Laterality: N/A;     A IV Location/Drains/Wounds Patient Lines/Drains/Airways Status   Active Line/Drains/Airways    Name:   Placement date:   Placement time:   Site:   Days:   Peripheral IV 09/03/18 Right Antecubital   09/03/18    1624    Antecubital   less than 1          Intake/Output Last 24 hours  Intake/Output Summary (Last 24 hours) at 09/03/2018 2021 Last data filed at 09/03/2018 1754 Gross per 24 hour  Intake 1000 ml  Output -  Net 1000 ml    Labs/Imaging Results for  orders placed or performed during the hospital encounter of 09/03/18 (from the past 48 hour(s))  Comprehensive metabolic panel     Status: Abnormal   Collection Time: 09/03/18  4:27 PM  Result Value Ref Range   Sodium 127 (L) 135 - 145 mmol/L   Potassium 4.7 3.5 - 5.1 mmol/L   Chloride 100 98 - 111 mmol/L   CO2 17 (L) 22 - 32 mmol/L   Glucose, Bld 133 (H) 70 - 99 mg/dL   BUN 17 6 - 20 mg/dL    Creatinine, Ser 0.89 0.44 - 1.00 mg/dL   Calcium 7.1 (L) 8.9 - 10.3 mg/dL   Total Protein 6.0 (L) 6.5 - 8.1 g/dL   Albumin 1.7 (L) 3.5 - 5.0 g/dL   AST 23 15 - 41 U/L   ALT 14 0 - 44 U/L   Alkaline Phosphatase 143 (H) 38 - 126 U/L   Total Bilirubin 1.7 (H) 0.3 - 1.2 mg/dL   GFR calc non Af Amer >60 >60 mL/min   GFR calc Af Amer >60 >60 mL/min   Anion gap 10 5 - 15    Comment: Performed at [facility], [place] [address] [place], [place] [address]  CBC WITH DIFFERENTIAL     Status: Abnormal   Collection Time: 09/03/18  4:27 PM  Result Value Ref Range   WBC 18.0 (H) 4.0 - 10.5 K/uL   RBC 1.81 (L) 3.87 - 5.11 MIL/uL   Hemoglobin 5.3 (LL) 12.0 - 15.0 g/dL    Comment: This critical result has verified and been called to [facility] by Braulio Conte on 07 28 2020 at 1730, and has been read back.    HCT 16.6 (L) 36.0 - 46.0 %   MCV 91.7 80.0 - 100.0 fL   MCH 29.3 26.0 - 34.0 pg   MCHC 31.9 30.0 - 36.0 g/dL   RDW 20.1 (H) 11.5 - 15.5 %   Platelets 16 (LL) 150 - 400 K/uL    Comment: REPEATED TO VERIFY PLATELET COUNT CONFIRMED BY SMEAR Immature Platelet Fraction may be clinically indicated, consider ordering this additional test [id number] THIS CRITICAL RESULT HAS VERIFIED AND BEEN CALLED TO LOGAN VAUGHN,RN BY MARVIN SCOTTON ON 07 28 2020 AT 1730, AND HAS BEEN READ BACK.     nRBC 0.6 (H) 0.0 - 0.2 %   Neutrophils Relative % 73 %   Neutro Abs 13.2 (H) 1.7 - 7.7 K/uL   Lymphocytes Relative 9 %   Lymphs Abs 1.5 0.7 - 4.0 K/uL   Monocytes Relative 9 %   Monocytes Absolute 1.6 (H) 0.1 - 1.0 K/uL   Eosinophils Relative 0 %   Eosinophils Absolute 0.0 0.0 - 0.5 K/uL   Basophils Relative 0 %   Basophils Absolute 0.0 0.0 - 0.1 K/uL   WBC Morphology MILD LEFT SHIFT (1-5% METAS, OCC MYELO, OCC BANDS)    Immature Granulocytes 9 %   Abs Immature Granulocytes 1.59 (H) 0.00 - 0.07 K/uL    Comment: Performed at [facility], [place] [address] [place],  [place] [address]  Urinalysis, Routine w reflex microscopic     Status: Abnormal   Collection Time: 09/03/18  4:27 PM  Result Value Ref Range   Color, Urine [place] (A) YELLOW    Comment: BIOCHEMICALS MAY BE AFFECTED BY COLOR   APPearance CLEAR CLEAR   Specific Gravity, Urine 1.013 1.005 - 1.030   pH 5.0 5.0 - 8.0   Glucose, UA NEGATIVE NEGATIVE mg/dL   Hgb  urine dipstick NEGATIVE NEGATIVE   Bilirubin Urine NEGATIVE NEGATIVE   Ketones, ur NEGATIVE NEGATIVE mg/dL   Protein, ur NEGATIVE NEGATIVE mg/dL   Nitrite NEGATIVE NEGATIVE   Leukocytes,Ua NEGATIVE NEGATIVE    Comment: Performed at [facility], [place] [address] [place], [place] [address]  hCG, quantitative, pregnancy     Status: None   Collection Time: 09/03/18  4:27 PM  Result Value Ref Range   hCG, Beta Chain, Quant, S 1 <5 mIU/mL    Comment:          GEST. AGE      CONC.  (mIU/mL)   <=1 WEEK        5 - 50     2 WEEKS       50 - 500     3 WEEKS       100 - 10,000     4 WEEKS     1,000 - 30,000     5 WEEKS     3,500 - 115,000   6-8 WEEKS     12,000 - 270,000    12 WEEKS     15,000 - 220,000        FEMALE AND NON-PREGNANT FEMALE:     LESS THAN 5 mIU/mL Performed at [facility], [place] [address], [place], [place] [address]   SARS Coronavirus 2 (CEPHEID- Performed in [place] hospital lab), Hosp Order     Status: None   Collection Time: 09/03/18  4:30 PM   Specimen: Nasopharyngeal Swab  Result Value Ref Range   SARS Coronavirus 2 NEGATIVE NEGATIVE    Comment: (NOTE) If result is NEGATIVE SARS-CoV-2 target nucleic acids are NOT DETECTED. The SARS-CoV-2 RNA is generally detectable in upper and lower  respiratory specimens during the acute phase of infection. The lowest  concentration of SARS-CoV-2 viral copies this assay can detect is 250  copies / mL. A negative result does not preclude SARS-CoV-2 infection  and should not be used as the sole basis for treatment or other  patient management  decisions.  A negative result may occur with  improper specimen collection / handling, submission of specimen other  than nasopharyngeal swab, presence of viral mutation(s) within the  areas targeted by this assay, and inadequate number of viral copies  (<250 copies / mL). A negative result must be combined with clinical  observations, patient history, and epidemiological information. If result is POSITIVE SARS-CoV-2 target nucleic acids are DETECTED. The SARS-CoV-2 RNA is generally detectable in upper and lower  respiratory specimens dur ing the acute phase of infection.  Positive  results are indicative of active infection with SARS-CoV-2.  Clinical  correlation with patient history and other diagnostic information is  necessary to determine patient infection status.  Positive results do  not rule out bacterial infection or co-infection with other viruses. If result is PRESUMPTIVE POSTIVE SARS-CoV-2 nucleic acids MAY BE PRESENT.   A presumptive positive result was obtained on the submitted specimen  and confirmed on repeat testing.  While 2019 novel coronavirus  (SARS-CoV-2) nucleic acids may be present in the submitted sample  additional confirmatory testing may be necessary for epidemiological  and / or clinical management purposes  to differentiate between  SARS-CoV-2 and other Sarbecovirus currently known to infect humans.  If clinically indicated additional testing with an alternate test  methodology [phone]) is advised. The SARS-CoV-2 RNA is generally  detectable in upper and lower respiratory sp ecimens during the acute  phase  of infection. The expected result is Negative. Fact Sheet for Patients:  StrictlyIdeas.no Fact Sheet for Healthcare Providers: BankingDealers.co.za This test is not yet approved or cleared by the [place] FDA and has been authorized for detection and/or diagnosis of SARS-CoV-2 by FDA under an  Emergency Use Authorization (EUA).  This EUA will remain in effect (meaning this test can be used) for the duration of the COVID-19 declaration under Section 564(b)(1) of the Act, 21 U.S.C. section 360bbb-3(b)(1), unless the authorization is terminated or revoked sooner. Performed at [facility], [place] [address], [place], [place] [address]   Lactic acid, plasma     Status: None   Collection Time: 09/03/18  5:58 PM  Result Value Ref Range   Lactic Acid, Venous 1.4 0.5 - 1.9 mmol/L    Comment: Performed at [facility], [place] [address] [place], [place] [address]  Protime-INR     Status: Abnormal   Collection Time: 09/03/18  5:58 PM  Result Value Ref Range   Prothrombin Time 16.5 (H) 11.4 - 15.2 seconds   INR 1.4 (H) 0.8 - 1.2    Comment: (NOTE) INR goal varies based on device and disease states. Performed at [facility], [place] [address] [place], [place] [address]   APTT     Status: Abnormal   Collection Time: 09/03/18  5:58 PM  Result Value Ref Range   aPTT 39 (H) 24 - 36 seconds    Comment:        IF BASELINE aPTT IS ELEVATED, SUGGEST PATIENT RISK ASSESSMENT BE USED TO DETERMINE APPROPRIATE ANTICOAGULANT THERAPY. Performed at [facility], [place] [address] [place], [place] [address]    Dg Chest Port 1 View  Result Date: 09/03/2018 CLINICAL DATA:  Cough and fever.  History of HIV disease EXAM: PORTABLE CHEST 1 VIEW COMPARISON:  August 12, 2018 chest radiograph; CT angiogram chest August 02, 2018 FINDINGS: There remains fine reticulonodular interstitial disease throughout the lungs, primarily in the mid and lower lung zones. The appearance is similar to most recent chest radiograph with less opacity overall compared to most recent CT. No new opacity evident. No consolidation. Heart size and pulmonary vascularity are normal. No adenopathy. No bone lesions. IMPRESSION: Fine reticulonodular interstitial opacity  bilaterally, similar to most recent chest radiograph, likely of infectious etiology. Suspect multifocal atypical organism pneumonia. No consolidation. No new opacity. No adenopathy evident. Electronically Signed   By: Lowella Grip III M.D.   On: 09/03/2018 15:39    Pending Labs Unresulted Labs (From admission, onward)    Start     Ordered   09/03/18 1929  Sodium, urine, random  Add-on,   AD     09/03/18 1928   09/03/18 1929  Osmolality, urine  Add-on,   AD     09/03/18 1928   09/03/18 1928  MRSA PCR Screening  Once,   STAT     09/03/18 1928   09/03/18 1928  Prepare Pheresed Platelets  (Adult Blood Administration - Platelets (Pheresed))  Once,   R    Comments: Sepsis with platelets 16k.  Question Answer Comment  Number of Apheresis Units 1 unit (6-10 packs)   Transfusion Indications Other (Specify)      09/03/18 1928   09/03/18 1812  Prepare RBC  (Adult Blood Administration - PRBC)  Once,   R    Question Answer Comment  # of Units 2 units   Transfusion Indications Symptomatic Anemia   If emergent release  call blood bank [facility] [phone]      09/03/18 1812   09/03/18 1811  Type and screen [place]  Once,   STAT    Comments: [place]    09/03/18 1810   09/03/18 1443  Lactic acid, plasma  Now then every 2 hours,   STAT     09/03/18 1444   09/03/18 1443  Blood Culture (routine x 2)  BLOOD CULTURE X 2,   STAT     09/03/18 1444   09/03/18 1443  Urine culture  ONCE - STAT,   STAT     09/03/18 1444   Signed and Held  Comprehensive metabolic panel  Tomorrow morning,   R     Signed and Held   Signed and Held  CBC WITH DIFFERENTIAL  Tomorrow morning,   R     Signed and Held   Signed and Held  Culture, sputum-assessment  Once,   R    Question:  Patient immune status  Answer:  Immunocompromised   Signed and Held   Signed and Held  Legionella Pneumophila Serogp 1 Ur Ag  Add-on,   R     Signed and Held   Signed and Held  Strep  pneumoniae urinary antigen  Add-on,   R     Signed and Held   Signed and Held  Respiratory Panel by PCR  (Respiratory virus panel with precautions)  Once,   R    Question:  Patient immune status  Answer:  Immunocompromised   Signed and Held          Vitals/Pain Today's Vitals   09/03/18 1810 09/03/18 1906 09/03/18 2000 09/03/18 2015  BP: 95/68 105/75 110/78   Pulse: (!) 112 (!) 114 (!) 123   Resp: (!) 36 (!) 39 (!) 30   Temp:      TempSrc:      SpO2: 99% 100% 99%   Weight:      Height:      PainSc:    6     Isolation Precautions No active isolations  Medications Medications  sodium chloride 0.9 % bolus 1,000 mL (0 mLs Intravenous Stopped 09/03/18 1754)    And  sodium chloride 0.9 % bolus 500 mL (0 mLs Intravenous Stopped 09/03/18 1926)    And  sodium chloride 0.9 % bolus 250 mL (has no administration in time range)  0.9 %  sodium chloride infusion (has no administration in time range)  0.9 %  sodium chloride infusion (Manually program via Guardrails IV Fluids) (has no administration in time range)  predniSONE (DELTASONE) tablet 40 mg (has no administration in time range)  ceFEPIme (MAXIPIME) 2 g in sodium chloride 0.9 % 100 mL IVPB (has no administration in time range)  ceFEPIme (MAXIPIME) 2 g in sodium chloride 0.9 % 100 mL IVPB (0 g Intravenous Stopped 09/03/18 1922)  metroNIDAZOLE (FLAGYL) IVPB 500 mg (0 mg Intravenous Stopped 09/03/18 1922)  vancomycin (VANCOCIN) 1,250 mg in sodium chloride 0.9 % 250 mL IVPB (1,250 mg Intravenous New Bag/Given 09/03/18 1756)    Mobility walks with person assist High fall risk   Focused Assessments N/A   R Recommendations: See Admitting Provider Note  Report given to:   Additional Notes: N/A

## 2018-09-03 NOTE — ED Notes (Signed)
Attempted to obtain labs, able to draw 1 lav/ 1 light green. Lab consulted for lab collection.

## 2018-09-03 NOTE — Progress Notes (Addendum)
Pharmacy Antibiotic Note  Krystal Short is a 33 y.o. female admitted on 09/03/2018 with pneumonia. PMH significant for HIV followed by [facility].  She has multiple recent admissions and completed 3 week course of clindamycin/primaquin for suspected PCP.  These have been restarted this admission.  Pharmacy has been consulted for Cefepime dosing.  09/03/2018:   Afebrile  WBC elevated, LA WNL  Scr at baseline  Plan: Cefepime 2gm IV q8h Monitor renal function and cx data   Height: 5' 0.5" (153.7 cm) Weight: 115 lb 12.6 oz (52.5 kg) IBW/kg (Calculated) : 46.65  Temp (24hrs), Avg:98.6 F (37 C), Min:98.6 F (37 C), Max:98.6 F (37 C)  Recent Labs  Lab 08/29/18 1226 09/03/18 1627 09/03/18 1758  WBC 5.7 18.0*  --   CREATININE 0.84 0.89  --   LATICACIDVEN  --   --  1.4    Estimated Creatinine Clearance: 67.5 mL/min (by C-G formula based on SCr of 0.89 mg/dL).    Allergies  Allergen Reactions  . Heparin Other (See Comments)    Unknown reaction    Antimicrobials this admission: 7/28 Cefepime >>  7/28 Zithromax>> 7/28 Clindamycin/Primaquine>> 7/28 Flagyl x1 in ED 7/28 Vancx1 in Ed  Dose adjustments this admission:  Microbiology results: 7/28 BCx:  7/28 COVID: negative 7/28 UCx:   MRSA PCR:   Thank you for allowing pharmacy to be a part of this patient's care.  Biagio Borg 09/03/2018 7:55 PM

## 2018-09-03 NOTE — Progress Notes (Signed)
A consult was received from an ED physician for cefepime and vancomycin per pharmacy dosing.  The patient's profile has been reviewed for ht/wt/allergies/indication/available labs.    A one time order has been placed for vancomycin 1250 mg and cefepime 2 g IV once.    Further antibiotics/pharmacy consults should be ordered by admitting physician if indicated.                       Thank you, Lenis Noon, PharmD 09/03/2018  2:53 PM

## 2018-09-04 ENCOUNTER — Inpatient Hospital Stay: Payer: 59 | Admitting: Infectious Diseases

## 2018-09-04 ENCOUNTER — Encounter (HOSPITAL_COMMUNITY): Payer: Self-pay | Admitting: Pulmonary Disease

## 2018-09-04 DIAGNOSIS — J189 Pneumonia, unspecified organism: Secondary | ICD-10-CM

## 2018-09-04 LAB — RESPIRATORY PANEL BY PCR

## 2018-09-04 LAB — OSMOLALITY, URINE: Osmolality, Ur: 303 mOsm/kg (ref 300–900)

## 2018-09-04 LAB — COMPREHENSIVE METABOLIC PANEL
ALT: 13 U/L (ref 0–44)
AST: 22 U/L (ref 15–41)
Albumin: 1.7 g/dL — ABNORMAL LOW (ref 3.5–5.0)
Alkaline Phosphatase: 142 U/L — ABNORMAL HIGH (ref 38–126)
Anion gap: 9 (ref 5–15)
BUN: 18 mg/dL (ref 6–20)
CO2: 16 mmol/L — ABNORMAL LOW (ref 22–32)
Calcium: 7 mg/dL — ABNORMAL LOW (ref 8.9–10.3)
Chloride: 107 mmol/L (ref 98–111)
Creatinine, Ser: 0.88 mg/dL (ref 0.44–1.00)
GFR calc Af Amer: >60 mL/min (ref >60)
GFR calc non Af Amer: >60 mL/min (ref >60)
Glucose, Bld: 140 mg/dL — ABNORMAL HIGH (ref 70–99)
Potassium: 4.6 mmol/L (ref 3.5–5.1)
Sodium: 132 mmol/L — ABNORMAL LOW (ref 135–145)
Total Bilirubin: 2.1 mg/dL — ABNORMAL HIGH (ref 0.3–1.2)
Total Protein: 6.2 g/dL — ABNORMAL LOW (ref 6.5–8.1)

## 2018-09-04 LAB — URINE CULTURE: Culture: NO GROWTH

## 2018-09-04 LAB — GLUCOSE, CAPILLARY: Glucose-Capillary: 134 mg/dL — ABNORMAL HIGH (ref 70–99)

## 2018-09-04 LAB — CBC WITH DIFFERENTIAL/PLATELET
Abs Immature Granulocytes: 1.85 10*3/uL — ABNORMAL HIGH (ref 0.00–0.07)
Basophils Absolute: 0.1 10*3/uL (ref 0.0–0.1)
Basophils Relative: 0 %
Eosinophils Absolute: 0 10*3/uL (ref 0.0–0.5)
Eosinophils Relative: 0 %
HCT: 28.5 % — ABNORMAL LOW (ref 36.0–46.0)
Hemoglobin: 9.3 g/dL — ABNORMAL LOW (ref 12.0–15.0)
Immature Granulocytes: 7 %
Lymphocytes Relative: 4 %
Lymphs Abs: 0.9 10*3/uL (ref 0.7–4.0)
MCH: 29.8 pg (ref 26.0–34.0)
MCHC: 32.6 g/dL (ref 30.0–36.0)
MCV: 91.3 fL (ref 80.0–100.0)
Monocytes Absolute: 2 10*3/uL — ABNORMAL HIGH (ref 0.1–1.0)
Monocytes Relative: 8 %
Neutro Abs: 20.1 10*3/uL — ABNORMAL HIGH (ref 1.7–7.7)
Neutrophils Relative %: 81 %
Platelets: 20 10*3/uL — CL (ref 150–400)
RBC: 3.12 MIL/uL — ABNORMAL LOW (ref 3.87–5.11)
RDW: 16.5 % — ABNORMAL HIGH (ref 11.5–15.5)
WBC: 25 10*3/uL — ABNORMAL HIGH (ref 4.0–10.5)
nRBC: 0.7 % — ABNORMAL HIGH (ref 0.0–0.2)

## 2018-09-04 LAB — PREPARE PLATELET PHERESIS: Unit division: 0

## 2018-09-04 LAB — SAVE SMEAR(SSMR), FOR PROVIDER SLIDE REVIEW

## 2018-09-04 LAB — BPAM PLATELET PHERESIS
Blood Product Expiration Date: 202007292359
ISSUE DATE / TIME: 202007282241
Unit Type and Rh: 6200

## 2018-09-04 LAB — MRSA PCR SCREENING: MRSA by PCR: NEGATIVE

## 2018-09-04 LAB — STREP PNEUMONIAE URINARY ANTIGEN: Strep Pneumo Urinary Antigen: NEGATIVE

## 2018-09-04 MED ORDER — PREDNISONE 5 MG PO TABS
15.0000 mg | ORAL_TABLET | Freq: Every day | ORAL | Status: DC
  Administered 2018-09-05 – 2018-09-09 (×5): 15 mg via ORAL
  Filled 2018-09-04 (×5): qty 1

## 2018-09-04 MED ORDER — AZITHROMYCIN 600 MG PO TABS
1200.0000 mg | ORAL_TABLET | ORAL | Status: DC
  Administered 2018-09-10: 1200 mg via ORAL
  Filled 2018-09-04: qty 2

## 2018-09-04 MED ORDER — GUAIFENESIN 100 MG/5ML PO SOLN
5.0000 mL | ORAL | Status: DC | PRN
  Administered 2018-09-04: 100 mg via ORAL
  Filled 2018-09-04: qty 10

## 2018-09-04 MED ORDER — PREDNISONE 20 MG PO TABS: 20.0000 mg | ORAL_TABLET | Freq: Every day | ORAL | Status: DC

## 2018-09-04 MED ORDER — ATOVAQUONE 750 MG/5ML PO SUSP
1500.0000 mg | Freq: Every day | ORAL | Status: DC
  Administered 2018-09-04 – 2018-09-12 (×9): 1500 mg via ORAL
  Filled 2018-09-04 (×11): qty 10

## 2018-09-04 NOTE — Progress Notes (Addendum)
HEMATOLOGY-ONCOLOGY PROGRESS NOTE  SUBJECTIVE: Patient is well-known to hematology.  She has had multiple admissions over the past few months for sepsis and suspected pneumocystis pneumonia.  During her hospitalizations, she has had significant anemia and thrombocytopenia.  She has previously undergone extensive work-up for her anemia and thrombocytopenia including a bone marrow biopsy.  The bone marrow biopsy performed on 07/05/2018 showed mildly hypercellular marrow with trilineage hematopoiesis.  The findings were nonspecific.  She had minimal dyspoietic changes in the erythroid precursors.  The changes were thought to be reactive due to her recurrent infections.   She was seen at the cancer center as an outpatient on 08/29/2018.  At that visit, she was noted to be hypotensive and tachycardic.  On that date, her white blood cell count was normal at 5.7, hemoglobin was 8.2, and platelet count was 26,000.  She was encouraged to go to the emergency room for evaluation and possible admission secondary to her hypotension and tachycardia but she declined.  She was given a liter of normal saline in our office.  She was also set up for a blood transfusion as well as IVIG in our office later this week.  She presented to the emergency room yesterday for intermittent fevers, shortness of breath, and cough.  Chest x-ray showed reticulonodular and interstitial opacity bilaterally which was similar to her recent chest radiograph and likely infectious.  Respiratory panel and COVID-19 testing negative.  Admission lab work showed a hemoglobin of 5.3 and platelet count of 16,000.  Her white blood cell count was elevated at 18,000.  She received 2 dose of packed red blood cells and 1 unit of platelets.  Lactic acid level was not elevated.  Blood cultures obtained and are negative to date.  When seen today, the patient reports ongoing nonproductive cough and shortness of breath.  T-max 99.9.  Denies chest discomfort, abdominal  pain, nausea, vomiting.  Denies bleeding.  Hematology was asked to see to make recommendations regarding her anemia and thrombocytopenia.  REVIEW OF SYSTEMS:   Constitutional: She has had intermittent fevers and chills at home.  Reports fatigue. Respiratory: Ports nonproductive cough and shortness of breath. Cardiovascular: Denies palpitation, chest discomfort Gastrointestinal:  Denies nausea, heartburn or change in bowel habits Skin: Denies abnormal skin rashes Lymphatics: Denies new lymphadenopathy or easy bruising Neurological:Denies numbness, tingling or new weaknesses Behavioral/Psych: Mood is stable, no new changes  Extremities: No lower extremity edema All other systems were reviewed with the patient and are negative.  I have reviewed the past medical history, past surgical history, social history and family history with the patient and they are unchanged from previous note.   PHYSICAL EXAMINATION:  Vitals:   09/04/18 0900 09/04/18 1000  BP: 117/64 124/65  Pulse: (!) 112 (!) 113  Resp: (!) 33 (!) 30  Temp:    SpO2: 97% 96%   Filed Weights   09/03/18 1404  Weight: 115 lb 12.6 oz (52.5 kg)    Intake/Output from previous day: 07/28 0701 - 07/29 0700 In: 2412 [Blood:962; IV Piggyback:1450] Out: 200 [Urine:200]  GENERAL:alert, no distress and comfortable EYES: normal, Conjunctiva are pink and non-injected, sclera clear OROPHARYNX:no exudate, no erythema and lips, buccal mucosa, and tongue normal  NECK: supple, thyroid normal size, non-tender, without nodularity LYMPH:  no palpable lymphadenopathy in the cervical, axillary or inguinal LUNGS: Fine rales, normal breathing effort HEART: Tachycardic, no lower extremity edema ABDOMEN:abdomen soft, non-tender and normal bowel sounds Musculoskeletal:no cyanosis of digits and no clubbing  NEURO: alert &  oriented x 3 with fluent speech, no focal motor/sensory deficits  LABORATORY DATA:  I have reviewed the data as listed CMP  Latest Ref Rng & Units 09/04/2018 09/03/2018 08/29/2018  Glucose 70 - 99 mg/dL 140(H) 133(H) 169(H)  BUN 6 - 20 mg/dL 18 17 13   Creatinine 0.44 - 1.00 mg/dL 0.88 0.89 0.84  Sodium 135 - 145 mmol/L 132(L) 127(L) 131(L)  Potassium 3.5 - 5.1 mmol/L 4.6 4.7 4.0  Chloride 98 - 111 mmol/L 107 100 101  CO2 22 - 32 mmol/L 16(L) 17(L) 21(L)  Calcium 8.9 - 10.3 mg/dL 7.0(L) 7.1(L) 8.4(L)  Total Protein 6.5 - 8.1 g/dL 6.2(L) 6.0(L) 7.1  Total Bilirubin 0.3 - 1.2 mg/dL 2.1(H) 1.7(H) 1.9(H)  Alkaline Phos 38 - 126 U/L 142(H) 143(H) 238(H)  AST 15 - 41 U/L 22 23 45(H)  ALT 0 - 44 U/L 13 14 34    Lab Results  Component Value Date   WBC 25.0 (H) 09/04/2018   HGB 9.3 (L) 09/04/2018   HCT 28.5 (L) 09/04/2018   MCV 91.3 09/04/2018   PLT 20 (LL) 09/04/2018   NEUTROABS 20.1 (H) 09/04/2018    Dg Chest 2 View  Result Date: 08/12/2018 CLINICAL DATA:  33 year old female with history of AIDS and concern for pneumonia. EXAM: CHEST - 2 VIEW COMPARISON:  Chest CT dated 08/02/2018 and radiograph dated 08/02/2018 FINDINGS: Overall interval improvement of bilateral reticular densities. No focal consolidation, pleural effusion, or pneumothorax. Top-normal cardiac silhouette. No acute osseous pathology. IMPRESSION: Interval improvement of the reticular pulmonary densities since the prior CT and radiograph. No focal consolidation. Electronically Signed   By: Anner Crete M.D.   On: 08/12/2018 14:12   Nm Hepatobiliary Liver Func  Result Date: 08/10/2018 CLINICAL DATA:  Lower abdominal pain since June 25th.  Nausea. EXAM: NUCLEAR MEDICINE HEPATOBILIARY IMAGING TECHNIQUE: Sequential images of the abdomen were obtained out to 60 minutes following intravenous administration of radiopharmaceutical. RADIOPHARMACEUTICALS:  7.4 mCi Tc-19m Choletec IV COMPARISON:  Ultrasound of 08/08/2018. FINDINGS: Prompt uptake and biliary excretion of activity by the liver is seen. Gallbladder activity is visualized, consistent with patency  of cystic duct. Biliary activity passes into small bowel, consistent with patent common bile duct. IMPRESSION: Normal hepatobiliary study, without evidence of acute cholecystitis. Electronically Signed   By: KAbigail MiyamotoM.D.   On: 08/10/2018 16:01   Dg Chest Port 1 View  Result Date: 09/03/2018 CLINICAL DATA:  Cough and fever.  History of HIV disease EXAM: PORTABLE CHEST 1 VIEW COMPARISON:  August 12, 2018 chest radiograph; CT angiogram chest August 02, 2018 FINDINGS: There remains fine reticulonodular interstitial disease throughout the lungs, primarily in the mid and lower lung zones. The appearance is similar to most recent chest radiograph with less opacity overall compared to most recent CT. No new opacity evident. No consolidation. Heart size and pulmonary vascularity are normal. No adenopathy. No bone lesions. IMPRESSION: Fine reticulonodular interstitial opacity bilaterally, similar to most recent chest radiograph, likely of infectious etiology. Suspect multifocal atypical organism pneumonia. No consolidation. No new opacity. No adenopathy evident. Electronically Signed   By: WLowella GripIII M.D.   On: 09/03/2018 15:39   UKoreaAbdomen Limited Ruq  Result Date: 08/08/2018 CLINICAL DATA:  Elevated bilirubin. EXAM: ULTRASOUND ABDOMEN LIMITED RIGHT UPPER QUADRANT COMPARISON:  CT abdomen pelvis dated July 11, 2018. FINDINGS: Gallbladder: Sludge. No gallstones. Asymmetric wall thickening measuring up to 5 mm. No sonographic Murphy sign noted by sonographer. Common bile duct: Diameter: 3 mm, normal. Liver: No  focal lesion identified. Within normal limits in parenchymal echogenicity. Portal vein is patent on color Doppler imaging with normal direction of blood flow towards the liver. Incidental note is made of splenomegaly. IMPRESSION: 1. Gallbladder sludge with asymmetric wall thickening measuring up to 5 mm, nonspecific. Acute cholecystitis is unlikely in the absence of gallstones or positive sonographic  Murphy sign. 2. Splenomegaly. Electronically Signed   By: Titus Dubin M.D.   On: 08/08/2018 15:27    ASSESSMENT AND PLAN: 1. Anemia  2. Thrombocytopenia 3. Leukocytosis 4. PCP, COVid19 neg 5. HIV/AIDS 6. Splenomegaly noted on prior abdominal ultrasound  -Anemia thrombocytopenia are likely multifactorial. Peripheral blood smear has been requested.  Will add on additional lab work including LDH, haptoglobin, immature platelet fraction, reticulocyte count, vitamin B12, folate, iron studies and ferritin, and Coombs test.   -Bone marrow biopsy results showed no overt evidence of granulomas lymphoma AFB or other overt viral inclusions. No overt evidence of MDS-like changes related to HIV. No megaloblastoid changes. Some minor hemophagocytic forms noted in the bone marrow which is likely reactive given her recurrent infections. No acute interventions for the minor hemophagocytic changes at this time other than treating underlying infectious process or acute inflammatory process.    -Although she is not septic this admission, she has had recurrent sepsis which could potentially flare her secondary HLH/ hemophagocytosis which could also worsen cytopenias.  The significantly elevated ferritin to 5k (from prior admission) would be concerning for early signs of this. Primary goal would to have her infections under control. Her antibiotics could potentially also add to thrombocytopenia and will need to be watched. -Recommend continued supportive transfusions.  Transfuse packed red blood cells for hemoglobin 7.5 and transfuse platelets for platelet count less than 20,000 or active bleeding. -Continue folic acid and B complex vitamin to support hematopoiesis.  -Please do 1 hour post count after each use of platelets. -Continue prednisone but attempt to wean as this could exacerbate her infection. -The patient was scheduled for IVIG as an outpatient later this week at the cancer center.  Recommend for this  to be given as an inpatient.   LOS: 1 day   Mikey Bussing, DNP, AGPCNP-BC, AOCNP 09/04/18  ADDENDUM  .Patient was Personally and independently interviewed, examined and relevant elements of the history of present illness were reviewed in details and an assessment and plan was created. All elements of the patient's history of present illness , assessment and plan were discussed in details with Mikey Bussing, DNP. The above documentation reflects our combined findings assessment and plan.  PBS with decreased platelets. No MAHA. Leucocytosis with myeloid left shift and Dohle bodies.  Worsening anemia and thrombocytopenia in tandem with increasing leucocytosis with toxic granulations is again concerning for recurrent sepsis with bone marrow suppression vs viral pneumonitis with Bone marrow suppression.(r/o CMV, EBV etc) Baseline anemia of chronic disease from HIV/AIDS. Given increase in Immature platelet fraction cannot r/o HIV related ITP. LDH levels progressively normalizing and does not co-related with anemia and appears to be related likely to lung inflammation and previous elevated LFTs. Her severely depletion nutritional status with severe protein calorie malnutrition from recurrent sepsis is also playing into her cytopenias. (Albumin level of 1.7) Plan Transfusion support for hgb<7.5 and platelets<20k in setting of sepsis. -mx of sepsis per hospitalist/ID -goals of care discussions -reasonable to give IVIG 1g/kg daily x 2 doses to determine if there is an immune component which might show rapid improvement in platelet counts. -may continue to wean off  steroids from hematology standpoint. -still with unclear pulmonary process -will consider rpt Bone marrow biopsy next week if unresolving cytopenias and based on patients goals of care.  Sullivan Lone MD MS

## 2018-09-04 NOTE — Consult Note (Signed)
  NAME:  Krystal Short, MRN:  [id number], DOB:  04/28/1985, LOS: 1 ADMISSION DATE:  09/03/2018, CONSULTATION DATE:  09/04/18 REFERRING MD:  Dr. Choi, CHIEF COMPLAINT:  SOB, Anemia   Brief History   33 y/o F who presented to [facility] on 7/29 with reports of low blood pressure (seen in Heme office) and palpitations.  Additionally, the patient was found to be anemic and tachypneic.     History of present illness   33 y/o F who presented to [facility] on 7/29 with reports of low blood pressure and palpitations.   She is HIV positive, initially diagnosed in 2019, with good medication compliance, suppression of viral load and persistently low CD4 count.  She is on Biktarvy and PCP prophylaxis with atovaquone (changed during 6/25 admit). She has been treated multiple times for PCP PNA but we have never had definitive confirmation of infection.  She has had a positive CMV and ID held treatment with valcyte as can be myelosuppressive.    Additionally, she has had persistent thrombocytopenia and anemia since 05/2017.  She has undergone a bone marrow biopsy 07/05/18 with non-specific findings (mildly hypercellular marrow with trilineage hematopoiesis) which were thought by Hematology to be related to recurrent infections.  The patient is well known to PCCM service and has been seen in consultation several times since January 2020 for persistent reticular infiltrates that wax / wane, mediastinal, hilar, paratracheal and supraclavicular lymphadenopathy. She had fever and was admitted from 07/04/18-6/9 and underwent EBUS on 6/5 which was negative for malignancy or granulomas.  She did grow Bordetella in a respiratory culture and was treated for such with azithromycin.The patient sleeps with her dog in the bed.  Additionally, she was treated with prednisone for a presumptive diagnosis of sarcoidosis.    The patient had travelled to [place] 6/13-6/14 and developed fevers (to 103) and barking cough. Most recent admission  from 6/25-7/16 for shortness of breath with associated hypoxia.   She had a worsening of infiltrates and fever during that admission.  She was positive for coronavirus 229e during that admission and it was felt some of her respiratory symptoms were related to acute viral infection.  She improved and was able to transition to room air.  Hospital course notable for anemia requiring transfusion and elevated LFT's (seen by GI).  There was discussion of the utility of repeat bronchoscopy but her respiratory symptoms improved.  She was discharged on 20 mg of prednisone.    She was recently seen in the Hematology office 08/29/18 with low BP and recommended to go to the ER at that time but the patient refused. Her hgb was 8.7 at that time.  She was given a liter of normal saline in the office and was planned to return to the clinic for IVIG.     The patient returned to the ER on 7/28 with palpitations and reports of low blood pressure. She also reported shortness of breath which she relates to her "blood counts" being low. COVID testing negative on admit.  Labs notable for Hgb of 5.3 and platelet count of 16k.  CXR was unchanged from prior.  The patient reports no new pulmonary symptoms and shortness of breath has resolved since blood transfusion. Currently denies SOB, pain.  Past Medical History  HIV - complications of thrush, bilateral reticular infiltrates.  Compliant with medications, suppression of viral load, persistently low CD4 count.  On PCP prophylaxis Molluscum  UTI  Anemia   Significant Hospital Events   7/28   Admit with hypotension, palpitations.  Found to be anemic  Consults:  ID  PCCM  Heme  Procedures:    Significant Diagnostic Tests:    Micro Data:  COVID 7/28 >>  UC 7/28 >>  RVP 7/28 >> negative  BCx2 7/28 >>   Antimicrobials:  Azithro 7/28 >> 7/29  Cefepime 7/28 >> 7/29    Interim history/subjective:  Patient reports she feels better after blood transfusion.  Denies  shortness of breath, pain.   Objective   Blood pressure 132/76, pulse (!) 104, temperature 98 F (36.7 C), temperature source Oral, resp. rate (!) 30, height 5' 0.5" (1.537 m), weight 52.5 kg, SpO2 97 %.        Intake/Output Summary (Last 24 hours) at 09/04/2018 1311 Last data filed at 09/04/2018 1119 Gross per 24 hour  Intake 2574.79 ml  Output 350 ml  Net 2224.79 ml   Filed Weights   09/03/18 1404  Weight: 52.5 kg    Examination: General: young adult female lying in bed in NAD HEENT: MM pink/moist, no jvd Neuro: AAOx4, speech clear, MAE CV: s1s2 RRR, no m/r/g PULM:  Mild tachypnea, no distress or increase in work of breathing, appears at baseline, on room air, lungs clear bilaterally  GI: soft, bsx4 active  Extremities: warm/dry, no edema  Skin: no rashes or lesions  Resolved Hospital Problem list     Assessment & Plan:   Dyspnea  -dyspnea on presentation, suspect in setting of anemia, resolved with transfusion   P: Follow respiratory status  Diffuse Reticular Pulmonary Infiltrates Mediastinal, Hilar, Supraclavicular Lymphadenopathy  -the patient's infiltrates currently are unchanged / stable from prior imaging -negative EBUS for granulomas or malignancy 07/2018 -based on chart review, it appears she was started on prednisone with concern for possible sarcoid but no granulomas on subsequent biopsy.  She has been continued on prednisone as an outpatient at 20mg.   P: -No role for repeat FOB at this time given patient has had multiple recent rounds of abx and is on prednisone.  Doubt it would give us much yield at this point.  Would like to see if we could taper the patient off steroids and then proceed with repeat FOB and coordinated lung biopsy if possible.  Her platelets certainly complicate the process.  This can be pursued as an outpatient.   -Stop antibiotics and monitor off.  Suspect her current presentation of dyspnea was related to anemia with Hgb of 5.3 and not  the underlying (now chronic) infiltrates  -Reduce prednisone to 15mg and consider taper of steroids to off by 5mg Q week. Agree with Heme that we should get her off prednisone if possible.    Best practice:  Diet: Per primary  Pain/Anxiety/Delirium protocol (if indicated): n/a VAP protocol (if indicated): n/a DVT prophylaxis: per primary  GI prophylaxis: n/a Glucose control: per primary  Mobility: as tolerated  Code Status: Full Code  Family Communication: Patient updated at bedside per Dr. Marshall  Disposition: Per primary   Labs    CBC: Recent Labs  Lab 08/29/18 1226 09/03/18 1627 09/04/18 0630  WBC 5.7 18.0* 25.0*  NEUTROABS 3.3 13.2* 20.1*  HGB 8.2* 5.3* 9.3*  HCT 26.0* 16.6* 28.5*  MCV 92.2 91.7 91.3  PLT 26* 16* 20*    Basic Metabolic Panel: Recent Labs  Lab 08/29/18 1226 09/03/18 1627 09/04/18 0630  NA 131* 127* 132*  K 4.0 4.7 4.6  CL 101 100 107  CO2 21* 17* 16*  GLUCOSE 169* 133* 140*    BUN 13 17 18  CREATININE 0.84 0.89 0.88  CALCIUM 8.4* 7.1* 7.0*   GFR: Estimated Creatinine Clearance: 68.3 mL/min (by C-G formula based on SCr of 0.88 mg/dL). Recent Labs  Lab 08/29/18 1226 09/03/18 1627 09/03/18 1758 09/04/18 0630  WBC 5.7 18.0*  --  25.0*  LATICACIDVEN  --   --  1.4  --     Liver Function Tests: Recent Labs  Lab 08/29/18 1226 09/03/18 1627 09/04/18 0630  AST 45* 23 22  ALT 34 14 13  ALKPHOS 238* 143* 142*  BILITOT 1.9* 1.7* 2.1*  PROT 7.1 6.0* 6.2*  ALBUMIN 2.1* 1.7* 1.7*   No results for input(s): LIPASE, AMYLASE in the last 168 hours. No results for input(s): AMMONIA in the last 168 hours.  ABG    Component Value Date/Time   HCO3 16.9 (L) 05/28/2018 1651   ACIDBASEDEF 7.1 (H) 05/28/2018 1651   O2SAT 72.7 05/28/2018 1651     Coagulation Profile: Recent Labs  Lab 09/03/18 1758  INR 1.4*    Cardiac Enzymes: No results for input(s): CKTOTAL, CKMB, CKMBINDEX, TROPONINI in the last 168 hours.  HbA1C: Hgb A1c MFr Bld   Date/Time Value Ref Range Status  08/09/2018 02:28 AM 5.5 4.8 - 5.6 % Final    Comment:    (NOTE) Pre diabetes:          5.7%-6.4% Diabetes:              >6.4% Glycemic control for   <7.0% adults with diabetes   08/02/2018 06:46 PM 4.7 (L) 4.8 - 5.6 % Final    Comment:    (NOTE) Pre diabetes:          5.7%-6.4% Diabetes:              >6.4% Glycemic control for   <7.0% adults with diabetes     CBG: Recent Labs  Lab 09/04/18 0734  GLUCAP 134*    Review of Systems: Positives in Bold  Gen: Denies fever, chills, weight change, fatigue, night sweats HEENT: Denies blurred vision, double vision, hearing loss, tinnitus, sinus congestion, rhinorrhea, sore throat, neck stiffness, dysphagia PULM: Denies shortness of breath with anemia / weakness but breathing normal at the present, cough, sputum production, hemoptysis, wheezing CV: Denies chest pain, edema, orthopnea, paroxysmal nocturnal dyspnea, palpitations GI: Denies abdominal pain, nausea, vomiting, diarrhea, hematochezia, melena, constipation, change in bowel habits GU: Denies dysuria, hematuria, polyuria, oliguria, urethral discharge Endocrine: Denies hot or cold intolerance, polyuria, polyphagia or appetite change Derm: Denies rash, dry skin, scaling or peeling skin change Heme: Denies easy bruising, bleeding, bleeding gums Neuro: Denies headache, numbness, weakness, slurred speech, loss of memory or consciousness  Past Medical History  She,  has a past medical history of Acute hyponatremia (06/03/2017), Anemia, CAP (community acquired pneumonia) (06/03/2017), Community acquired pneumonia (06/05/2017), Facial dermatitis (06/03/2017), HIV (human immunodeficiency virus infection) (HCC), Pleurisy (06/03/2017), Pneumonia (06/06/2017), 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 (HCC), Thrush of mouth and esophagus (HCC), and UTI (urinary tract infection).   Surgical History    Past Surgical History:  Procedure Laterality  Date  . BIOPSY  07/12/2018   Procedure: BIOPSY;  Surgeon: Olalere, Adewale A, MD;  Location: [facility];  Service: Endoscopy;;  . BRONCHIAL WASHINGS  07/12/2018   Procedure: BRONCHIAL WASHINGS;  Surgeon: Olalere, Adewale A, MD;  Location: [facility];  Service: Endoscopy;;  . ENDOBRONCHIAL ULTRASOUND N/A 07/12/2018   Procedure: ENDOBRONCHIAL ULTRASOUND;  Surgeon: Olalere, Adewale A, MD;  Location: [facility];    Service: Endoscopy;  Laterality: N/A;  . FINE NEEDLE ASPIRATION BIOPSY  07/12/2018   Procedure: FINE NEEDLE ASPIRATION BIOPSY;  Surgeon: Olalere, Adewale A, MD;  Location: [facility];  Service: Endoscopy;;  . NO PAST SURGERIES    . VIDEO BRONCHOSCOPY N/A 07/12/2018   Procedure: VIDEO BRONCHOSCOPY WITHOUT FLUORO;  Surgeon: Olalere, Adewale A, MD;  Location: [facility];  Service: Endoscopy;  Laterality: N/A;     Social History   reports that she quit smoking about 20 months ago. Her smoking use included cigarettes and cigars. She has a 2.00 pack-year smoking history. She has never used smokeless tobacco. She reports current alcohol use. She reports that she does not use drugs.   Family History   Her family history includes Asthma in her paternal grandmother; Healthy in her father and mother.   Allergies Allergies  Allergen Reactions  . Heparin Other (See Comments)    Unknown reaction     Home Medications  Prior to Admission medications   Medication Sig Start Date End Date Taking? Authorizing Provider  acetaminophen (TYLENOL) 325 MG tablet Take 650 mg by mouth every 6 (six) hours as needed for mild pain or headache.   Yes [provider]  atovaquone (MEPRON) 750 MG/5ML suspension Take 10 mLs (1,500 mg total) by mouth daily. 08/23/18 09/22/18 Yes Powell, A Caldwell Jr., MD  B Complex-C (B-COMPLEX WITH VITAMIN C) tablet Take 1 tablet by mouth daily. 08/23/18 09/22/18 Yes Powell, A Caldwell Jr., MD  bictegravir-emtricitabine-tenofovir AF (BIKTARVY) 50-200-25 MG TABS tablet Take 1  tablet by mouth daily. 06/20/18  Yes Calone, Gregory D, FNP  folic acid (FOLVITE) 1 MG tablet Take 2 tablets (2 mg total) by mouth daily. 08/23/18 09/22/18 Yes Powell, A Caldwell Jr., MD  ondansetron (ZOFRAN) 4 MG tablet Take 1 tablet (4 mg total) by mouth every 6 (six) hours. 05/24/18  Yes Nelson, Yvonne Sue, MD  predniSONE (DELTASONE) 10 MG tablet Take 3 tablets (30 mg total) by mouth daily with breakfast for 14 days. Please follow up with your oncologist as an outpatient for a taper (do not stop abruptly) Patient taking differently: Take 20 mg by mouth daily with breakfast. Please follow up with your oncologist as an outpatient for a taper (do not stop abruptly) 08/23/18 09/06/18 Yes Powell, A Caldwell Jr., MD     Critical care time: n/a    Brandi Ollis, NP-C [facility] Pgr: [phone] or if no answer [phone] 09/04/2018, 1:11 PM     

## 2018-09-04 NOTE — Progress Notes (Signed)
PROGRESS NOTE    Krystal Short  JAS:[id number] DOB: 1985/02/27 DOA: 09/03/2018 PCP: Lajean Manes, MD     Brief Narrative:  Krystal Short is a 33 y.o. female with medical history significant for AIDS and recent recurrent admissions for pneumonia, now returning to the emergency department for evaluation of generalized weakness, fatigue, low blood pressure, and elevated heart rate.  Patient was admitted to the hospital last month and discharged on 08/22/2018 after treatment of recurrent atypical pneumonia complicated by pancytopenia, acute kidney injury, and hyponatremia.  During the recent admission, she was treated for suspected pneumocystis pneumonia, transfused 7 units of platelets and 8 units of RBC, and reports that she was feeling much better when she returned home.  She began to develop generalized weakness and fatigue approximately 5 days ago, progressively worsening.  Also admits to some intermittent dry cough, shortness of breath with exertion.  In the emergency department, chest x-ray revealed fine reticulonodular interstitial opacity bilaterally, similar to most recent chest radiograph, likely of infectious etiology. Suspect multifocal atypical organism pneumonia.  Hemoglobin 5.3, platelets 16,000.  Patient was given 2 units packed red blood cell as well as 1 unit platelet.  She was started on empiric vancomycin, cefepime, clindamycin.  New events last 24 hours / Subjective: Patient continues to complain of exertional dyspnea, dry cough as well as generalized weakness.  Assessment & Plan:   Principal Problem:   Sepsis due to pneumonia [facility]) Active Problems:   Hyponatremia   AIDS (acquired immune deficiency syndrome) (HCC)   Symptomatic anemia   Thrombocytopenia (HCC)   Generalized weakness, deconditioning likely in setting of anemia, thrombocytopenia   -Hgb is 5.3 and platelets 16k on admission without bleeding  -Underwent extensive inpatient workup during recent admission with  unclear etiology; she received 8 units RBC and platelet transfusion x7 during that admission  -Transfuse 2 unit packed red blood cell, 1 unit platelet.  Continue to trend CBC -Consulted oncology.  Patient follows with Dr. Irene Limbo, patient states that she was supposed to have outpatient blood transfusion next week as well as IVIG initiation -Prednisone - back down to home dose 20mg  daily   Atypical pneumonia -Recent hospitalization and discharged on 08/22/2018: At that time autoimmune, vasculitis panel were unremarkable, fungal work-up negative, bronchoscopy on 6/5 revealed scant lung parenchyma with fibrosis and reactive changes.  There was concern for PCP during that admission and patient was started on primaquine, clindamycin which completed while inpatient.  ID at that point recommended open lung biopsy if stable from thrombocytopenia standpoint.  Deferred antiviral treatment for CMV as this was felt to be consistent with commensal infection rather than primary/causal role, Valcyte can also be myelosuppressive -Respiratory viral panel, COVID-19 negative -Strep pneumo antigen negative -MRSA PCR negative -Blood cultures pending -Legionella antigen pending -Azithromycin, cefepime. Stop clindamycin  -Consult infectious disease -Consult pulmonology   HIV/AIDS  -CD4 41 on 07/12/18 and VL undetectable on 08/15/18 -Continue Biktarvy    Hyponatremia  -Improved, continue to monitor BMP       DVT prophylaxis: SCD Code Status: Full code Family Communication: None Disposition Plan: Pending further work-up, consultations, improvement   Consultants:   Infectious disease  Oncology  Pulmonology   Procedures:   None  Antimicrobials:  Anti-infectives (From admission, onward)   Start     Dose/Rate Route Frequency Ordered Stop   09/04/18 1000  bictegravir-emtricitabine-tenofovir AF (BIKTARVY) 50-200-25 MG per tablet 1 tablet     1 tablet Oral Daily 09/03/18 2122     09/04/18 1000  primaquine tablet 30 mg  Status:  Discontinued     30 mg Oral Daily 09/03/18 2220 09/03/18 2222   09/04/18 0200  ceFEPIme (MAXIPIME) 2 g in sodium chloride 0.9 % 100 mL IVPB     2 g 200 mL/hr over 30 Minutes Intravenous Every 8 hours 09/03/18 2010     09/03/18 2200  azithromycin (ZITHROMAX) 500 mg in sodium chloride 0.9 % 250 mL IVPB     500 mg 250 mL/hr over 60 Minutes Intravenous Daily at bedtime 09/03/18 2122 09/08/18 2159   09/03/18 2200  clindamycin (CLEOCIN) IVPB 900 mg     900 mg 100 mL/hr over 30 Minutes Intravenous Every 8 hours 09/03/18 2122     09/03/18 2130  primaquine tablet 30 mg     30 mg Oral Daily 09/03/18 2122     09/03/18 1500  vancomycin (VANCOCIN) 1,250 mg in sodium chloride 0.9 % 250 mL IVPB     1,250 mg 166.7 mL/hr over 90 Minutes Intravenous  Once 09/03/18 1452 09/04/18 0714   09/03/18 1445  ceFEPIme (MAXIPIME) 2 g in sodium chloride 0.9 % 100 mL IVPB     2 g 200 mL/hr over 30 Minutes Intravenous  Once 09/03/18 1444 09/03/18 1922   09/03/18 1445  metroNIDAZOLE (FLAGYL) IVPB 500 mg     500 mg 100 mL/hr over 60 Minutes Intravenous  Once 09/03/18 1444 09/03/18 1922   09/03/18 1445  vancomycin (VANCOCIN) IVPB 1000 mg/200 mL premix  Status:  Discontinued     1,000 mg 200 mL/hr over 60 Minutes Intravenous  Once 09/03/18 1444 09/03/18 1452        Objective: Vitals:   09/04/18 0700 09/04/18 0800 09/04/18 0900 09/04/18 1000  BP: 124/73 114/69 117/64 124/65  Pulse: (!) 107 (!) 114 (!) 112 (!) 113  Resp: (!) 32 (!) 35 (!) 33 (!) 30  Temp:  97.8 F (36.6 C)    TempSrc:  Axillary    SpO2: 99% 97% 97% 96%  Weight:      Height:        Intake/Output Summary (Last 24 hours) at 09/04/2018 1026 Last data filed at 09/04/2018 1005 Gross per 24 hour  Intake 2562 ml  Output 350 ml  Net 2212 ml   Filed Weights   09/03/18 1404  Weight: 52.5 kg    Examination:  General exam: Appears calm  Respiratory system: Fine crackles, on room air, tachypneic, with some  conversational dyspnea Cardiovascular system: S1 & S2 heard, tachycardic, regular rhythm. No JVD, murmurs, rubs, gallops or clicks. No pedal edema. Gastrointestinal system: Abdomen is nondistended, soft and nontender. No organomegaly or masses felt. Normal bowel sounds heard. Central nervous system: Alert and oriented. No focal neurological deficits. Extremities: Symmetric  Skin: No rashes, lesions or ulcers Psychiatry: Judgement and insight appear normal. Mood & affect appropriate.   Data Reviewed: I have personally reviewed following labs and imaging studies  CBC: Recent Labs  Lab 08/29/18 1226 09/03/18 1627 09/04/18 0630  WBC 5.7 18.0* 25.0*  NEUTROABS 3.3 13.2* 20.1*  HGB 8.2* 5.3* 9.3*  HCT 26.0* 16.6* 28.5*  MCV 92.2 91.7 91.3  PLT 26* 16* 20*   Basic Metabolic Panel: Recent Labs  Lab 08/29/18 1226 09/03/18 1627 09/04/18 0630  NA 131* 127* 132*  K 4.0 4.7 4.6  CL 101 100 107  CO2 21* 17* 16*  GLUCOSE 169* 133* 140*  BUN 13 17 18   CREATININE 0.84 0.89 0.88  CALCIUM 8.4* 7.1* 7.0*   GFR: Estimated Creatinine  Clearance: 68.3 mL/min (by C-G formula based on SCr of 0.88 mg/dL). Liver Function Tests: Recent Labs  Lab 08/29/18 1226 09/03/18 1627 09/04/18 0630  AST 45* 23 22  ALT 34 14 13  ALKPHOS 238* 143* 142*  BILITOT 1.9* 1.7* 2.1*  PROT 7.1 6.0* 6.2*  ALBUMIN 2.1* 1.7* 1.7*   No results for input(s): LIPASE, AMYLASE in the last 168 hours. No results for input(s): AMMONIA in the last 168 hours. Coagulation Profile: Recent Labs  Lab 09/03/18 1758  INR 1.4*   Cardiac Enzymes: No results for input(s): CKTOTAL, CKMB, CKMBINDEX, TROPONINI in the last 168 hours. BNP (last 3 results) No results for input(s): PROBNP in the last 8760 hours. HbA1C: No results for input(s): HGBA1C in the last 72 hours. CBG: Recent Labs  Lab 09/04/18 0734  GLUCAP 134*   Lipid Profile: No results for input(s): CHOL, HDL, LDLCALC, TRIG, CHOLHDL, LDLDIRECT in the last 72  hours. Thyroid Function Tests: No results for input(s): TSH, T4TOTAL, FREET4, T3FREE, THYROIDAB in the last 72 hours. Anemia Panel: No results for input(s): VITAMINB12, FOLATE, FERRITIN, TIBC, IRON, RETICCTPCT in the last 72 hours. Sepsis Labs: Recent Labs  Lab 09/03/18 1758  LATICACIDVEN 1.4    Recent Results (from the past 240 hour(s))  SARS Coronavirus 2 (CEPHEID- Performed in [facility] hospital lab), Hosp Order     Status: None   Collection Time: 09/03/18  4:30 PM   Specimen: Nasopharyngeal Swab  Result Value Ref Range Status   SARS Coronavirus 2 NEGATIVE NEGATIVE Final    Comment: (NOTE) If result is NEGATIVE SARS-CoV-2 target nucleic acids are NOT DETECTED. The SARS-CoV-2 RNA is generally detectable in upper and lower  respiratory specimens during the acute phase of infection. The lowest  concentration of SARS-CoV-2 viral copies this assay can detect is 250  copies / mL. A negative result does not preclude SARS-CoV-2 infection  and should not be used as the sole basis for treatment or other  patient management decisions.  A negative result may occur with  improper specimen collection / handling, submission of specimen other  than nasopharyngeal swab, presence of viral mutation(s) within the  areas targeted by this assay, and inadequate number of viral copies  (<250 copies / mL). A negative result must be combined with clinical  observations, patient history, and epidemiological information. If result is POSITIVE SARS-CoV-2 target nucleic acids are DETECTED. The SARS-CoV-2 RNA is generally detectable in upper and lower  respiratory specimens dur ing the acute phase of infection.  Positive  results are indicative of active infection with SARS-CoV-2.  Clinical  correlation with patient history and other diagnostic information is  necessary to determine patient infection status.  Positive results do  not rule out bacterial infection or co-infection with other viruses. If  result is PRESUMPTIVE POSTIVE SARS-CoV-2 nucleic acids MAY BE PRESENT.   A presumptive positive result was obtained on the submitted specimen  and confirmed on repeat testing.  While 2019 novel coronavirus  (SARS-CoV-2) nucleic acids may be present in the submitted sample  additional confirmatory testing may be necessary for epidemiological  and / or clinical management purposes  to differentiate between  SARS-CoV-2 and other Sarbecovirus currently known to infect humans.  If clinically indicated additional testing with an alternate test  methodology [phone]) is advised. The SARS-CoV-2 RNA is generally  detectable in upper and lower respiratory sp ecimens during the acute  phase of infection. The expected result is Negative. Fact Sheet for Patients:  StrictlyIdeas.no Fact Sheet  for Healthcare Providers: BankingDealers.co.za This test is not yet approved or cleared by the [place] and has been authorized for detection and/or diagnosis of SARS-CoV-2 by FDA under an Emergency Use Authorization (EUA).  This EUA will remain in effect (meaning this test can be used) for the duration of the COVID-19 declaration under Section 564(b)(1) of the Act, 21 U.S.C. section 360bbb-3(b)(1), unless the authorization is terminated or revoked sooner. Performed at [facility], [place] [address], [place], [place] [address]   Blood Culture (routine x 2)     Status: None (Preliminary result)   Collection Time: 09/03/18  5:58 PM   Specimen: BLOOD  Result Value Ref Range Status   Specimen Description   Final    BLOOD LEFT ANTECUBITAL Performed at [facility] [address], [place], [place] [address]    Special Requests   Final    BOTTLES DRAWN AEROBIC ONLY Blood Culture adequate volume Performed at [facility] [address] [place], [place] [address]    Culture   Final    NO GROWTH < 12  HOURS Performed at [facility] [address] [place], [place] [address]    Report Status PENDING  Incomplete  Blood Culture (routine x 2)     Status: None (Preliminary result)   Collection Time: 09/03/18  5:58 PM   Specimen: BLOOD  Result Value Ref Range Status   Specimen Description   Final    BLOOD RIGHT ANTECUBITAL Performed at [facility] [address], [place], [place] [address]    Special Requests   Final    BOTTLES DRAWN AEROBIC ONLY Blood Culture adequate volume Performed at [facility] [address], [place], [place] [address]    Culture   Final    NO GROWTH < 12 HOURS Performed at [facility] [address], [place], [place] [address]    Report Status PENDING  Incomplete  MRSA PCR Screening     Status: None   Collection Time: 09/03/18 10:14 PM   Specimen: Nasopharyngeal  Result Value Ref Range Status   MRSA by PCR NEGATIVE NEGATIVE Final    Comment:        The GeneXpert MRSA Assay (FDA approved for NASAL specimens only), is one component of a comprehensive MRSA colonization surveillance program. It is not intended to diagnose MRSA infection nor to guide or monitor treatment for MRSA infections. Performed at [facility], [place] [address], [place], [place] [address]   Respiratory Panel by PCR     Status: None   Collection Time: 09/03/18 10:14 PM   Specimen: Nasopharyngeal Swab; Respiratory  Result Value Ref Range Status   Adenovirus NOT DETECTED NOT DETECTED Final   Coronavirus 229E NOT DETECTED NOT DETECTED Final    Comment: (NOTE) The Coronavirus on the Respiratory Panel, DOES NOT test for the novel  Coronavirus (2019 nCoV)    Coronavirus HKU1 NOT DETECTED NOT DETECTED Final   Coronavirus NL63 NOT DETECTED NOT DETECTED Final   Coronavirus OC43 NOT DETECTED NOT DETECTED Final   Metapneumovirus NOT DETECTED NOT DETECTED Final   Rhinovirus / Enterovirus NOT DETECTED NOT DETECTED  Final   Influenza A NOT DETECTED NOT DETECTED Final   Influenza B NOT DETECTED NOT DETECTED Final   Parainfluenza Virus 1 NOT DETECTED NOT DETECTED Final   Parainfluenza Virus 2 NOT DETECTED NOT DETECTED Final   Parainfluenza Virus 3 NOT DETECTED NOT DETECTED Final  Parainfluenza Virus 4 NOT DETECTED NOT DETECTED Final   Respiratory Syncytial Virus NOT DETECTED NOT DETECTED Final   Bordetella pertussis NOT DETECTED NOT DETECTED Final   Chlamydophila pneumoniae NOT DETECTED NOT DETECTED Final   Mycoplasma pneumoniae NOT DETECTED NOT DETECTED Final    Comment: Performed at [facility], [place] [address] [place], [place] [address]      Radiology Studies: Dg Chest Port 1 View  Result Date: 09/03/2018 CLINICAL DATA:  Cough and fever.  History of HIV disease EXAM: PORTABLE CHEST 1 VIEW COMPARISON:  August 12, 2018 chest radiograph; CT angiogram chest August 02, 2018 FINDINGS: There remains fine reticulonodular interstitial disease throughout the lungs, primarily in the mid and lower lung zones. The appearance is similar to most recent chest radiograph with less opacity overall compared to most recent CT. No new opacity evident. No consolidation. Heart size and pulmonary vascularity are normal. No adenopathy. No bone lesions. IMPRESSION: Fine reticulonodular interstitial opacity bilaterally, similar to most recent chest radiograph, likely of infectious etiology. Suspect multifocal atypical organism pneumonia. No consolidation. No new opacity. No adenopathy evident. Electronically Signed   By: Lowella Grip III M.D.   On: 09/03/2018 15:39      Scheduled Meds: . B-complex with vitamin C  1 tablet Oral Daily  . bictegravir-emtricitabine-tenofovir AF  1 tablet Oral Daily  . Chlorhexidine Gluconate Cloth  6 each Topical Daily  . folic acid  2 mg Oral Daily  . predniSONE  40 mg Oral BID WC  . primaquine  30 mg Oral Daily  . sodium chloride flush  10-40 mL Intracatheter Q12H  . sodium chloride  flush  3 mL Intravenous Q12H  . sodium chloride flush  3 mL Intravenous Q12H   Continuous Infusions: . sodium chloride 250 mL (09/04/18 0932)  . azithromycin Stopped (09/04/18 0034)  . ceFEPime (MAXIPIME) IV Stopped (09/04/18 1005)  . clindamycin (CLEOCIN) IV Stopped (09/04/18 0707)  . sodium chloride       LOS: 1 day      Time spent: 60 minutes   Dessa Phi, DO [facility] [email or web address] 09/04/2018, 10:26 AM

## 2018-09-05 ENCOUNTER — Inpatient Hospital Stay: Payer: 59

## 2018-09-05 DIAGNOSIS — D696 Thrombocytopenia, unspecified: Secondary | ICD-10-CM

## 2018-09-05 DIAGNOSIS — R531 Weakness: Secondary | ICD-10-CM

## 2018-09-05 DIAGNOSIS — A419 Sepsis, unspecified organism: Principal | ICD-10-CM

## 2018-09-05 DIAGNOSIS — D649 Anemia, unspecified: Secondary | ICD-10-CM

## 2018-09-05 LAB — LEGIONELLA PNEUMOPHILA SEROGP 1 UR AG: L. pneumophila Serogp 1 Ur Ag: NEGATIVE

## 2018-09-05 LAB — CBC WITH DIFFERENTIAL/PLATELET
Abs Immature Granulocytes: 3.48 10*3/uL — ABNORMAL HIGH (ref 0.00–0.07)
Basophils Absolute: 0.1 10*3/uL (ref 0.0–0.1)
Basophils Relative: 0 %
Eosinophils Absolute: 0 10*3/uL (ref 0.0–0.5)
Eosinophils Relative: 0 %
HCT: 27.1 % — ABNORMAL LOW (ref 36.0–46.0)
Hemoglobin: 8.9 g/dL — ABNORMAL LOW (ref 12.0–15.0)
Immature Granulocytes: 16 %
Lymphocytes Relative: 6 %
Lymphs Abs: 1.2 10*3/uL (ref 0.7–4.0)
MCH: 29.3 pg (ref 26.0–34.0)
MCHC: 32.8 g/dL (ref 30.0–36.0)
MCV: 89.1 fL (ref 80.0–100.0)
Monocytes Absolute: 0.9 10*3/uL (ref 0.1–1.0)
Monocytes Relative: 4 %
Neutro Abs: 16.7 10*3/uL — ABNORMAL HIGH (ref 1.7–7.7)
Neutrophils Relative %: 74 %
Platelets: 27 10*3/uL — CL (ref 150–400)
RBC: 3.04 MIL/uL — ABNORMAL LOW (ref 3.87–5.11)
RDW: 17.6 % — ABNORMAL HIGH (ref 11.5–15.5)
WBC: 22.4 10*3/uL — ABNORMAL HIGH (ref 4.0–10.5)
nRBC: 1.2 % — ABNORMAL HIGH (ref 0.0–0.2)

## 2018-09-05 LAB — TYPE AND SCREEN
ABO/RH(D): O POS
Antibody Screen: NEGATIVE
Unit division: 0
Unit division: 0

## 2018-09-05 LAB — CBC
HCT: 27.5 % — ABNORMAL LOW (ref 36.0–46.0)
Hemoglobin: 8.8 g/dL — ABNORMAL LOW (ref 12.0–15.0)
MCH: 28.8 pg (ref 26.0–34.0)
MCHC: 32 g/dL (ref 30.0–36.0)
MCV: 89.9 fL (ref 80.0–100.0)
Platelets: 12 10*3/uL — CL (ref 150–400)
RBC: 3.06 MIL/uL — ABNORMAL LOW (ref 3.87–5.11)
RDW: 17.4 % — ABNORMAL HIGH (ref 11.5–15.5)
WBC: 20.3 10*3/uL — ABNORMAL HIGH (ref 4.0–10.5)
nRBC: 0.8 % — ABNORMAL HIGH (ref 0.0–0.2)

## 2018-09-05 LAB — BPAM RBC
Blood Product Expiration Date: 202008242359
Blood Product Expiration Date: 202008242359
ISSUE DATE / TIME: 202007290102
ISSUE DATE / TIME: 202007290257
Unit Type and Rh: 5100
Unit Type and Rh: 5100

## 2018-09-05 LAB — FERRITIN: Ferritin: 4590 ng/mL — ABNORMAL HIGH (ref 11–307)

## 2018-09-05 LAB — BASIC METABOLIC PANEL
Anion gap: 9 (ref 5–15)
BUN: 23 mg/dL — ABNORMAL HIGH (ref 6–20)
CO2: 16 mmol/L — ABNORMAL LOW (ref 22–32)
Calcium: 7.6 mg/dL — ABNORMAL LOW (ref 8.9–10.3)
Chloride: 106 mmol/L (ref 98–111)
Creatinine, Ser: 0.93 mg/dL (ref 0.44–1.00)
GFR calc Af Amer: >60 mL/min (ref >60)
GFR calc non Af Amer: >60 mL/min (ref >60)
Glucose, Bld: 103 mg/dL — ABNORMAL HIGH (ref 70–99)
Potassium: 4.2 mmol/L (ref 3.5–5.1)
Sodium: 131 mmol/L — ABNORMAL LOW (ref 135–145)

## 2018-09-05 LAB — IRON AND TIBC
Iron: 15 ug/dL — ABNORMAL LOW (ref 28–170)
Saturation Ratios: 10 % — ABNORMAL LOW (ref 10.4–31.8)
TIBC: 146 ug/dL — ABNORMAL LOW (ref 250–450)
UIBC: 131 ug/dL

## 2018-09-05 LAB — RETICULOCYTES
Immature Retic Fract: 22.7 % — ABNORMAL HIGH (ref 2.3–15.9)
RBC.: 3.06 MIL/uL — ABNORMAL LOW (ref 3.87–5.11)
Retic Count, Absolute: 19.9 10*3/uL (ref 19.0–186.0)
Retic Ct Pct: 0.7 % (ref 0.4–3.1)

## 2018-09-05 LAB — DIRECT ANTIGLOBULIN TEST (NOT AT ARMC)
DAT, IgG: NEGATIVE
DAT, complement: NEGATIVE

## 2018-09-05 LAB — GLUCOSE, CAPILLARY: Glucose-Capillary: 88 mg/dL (ref 70–99)

## 2018-09-05 LAB — LACTATE DEHYDROGENASE: LDH: 224 U/L — ABNORMAL HIGH (ref 98–192)

## 2018-09-05 LAB — IMMATURE PLATELET FRACTION: Immature Platelet Fraction: 18.7 % — ABNORMAL HIGH (ref 1.2–8.6)

## 2018-09-05 LAB — VITAMIN B12: Vitamin B-12: 633 pg/mL (ref 180–914)

## 2018-09-05 LAB — FOLATE: Folate: 17.8 ng/mL (ref >5.9)

## 2018-09-05 MED ORDER — METOPROLOL TARTRATE 5 MG/5ML IV SOLN: 5.0000 mg | INTRAVENOUS | Status: DC | PRN

## 2018-09-05 MED ORDER — SODIUM CHLORIDE 0.9% IV SOLUTION
Freq: Once | INTRAVENOUS | Status: AC
  Administered 2018-09-05: 08:00:00 via INTRAVENOUS

## 2018-09-05 MED ORDER — IMMUNE GLOBULIN (HUMAN) 20 GM/200ML IV SOLN
1.0000 g/kg | INTRAVENOUS | Status: AC
  Administered 2018-09-05 – 2018-09-07 (×3): 50 g via INTRAVENOUS
  Filled 2018-09-05 (×2): qty 100

## 2018-09-05 MED ORDER — METOPROLOL TARTRATE 5 MG/5ML IV SOLN
5.0000 mg | INTRAVENOUS | Status: AC | PRN
  Administered 2018-09-05 – 2018-09-06 (×2): 5 mg via INTRAVENOUS
  Filled 2018-09-05 (×2): qty 5

## 2018-09-05 NOTE — Progress Notes (Signed)
Pulm/CCM   I saw this pt in office with concerns re low bp and anemia/thrombocytopenia and addressed the ddx for pulmonary filtrates  08/30/2018  - I strongly doubt sarcoid based on previus neg TBBX and ebus 07/12/2018  and understood from pt that the prednisone was being tapered by hematology rx for low plts which preclude lung bx but could do BAL if pressed.  I would focus on the cause of her severe anemia and thrombocytopenia for now and taper prednisone as planned and happy  To f/u in office for any progression of pulmonary infiltrates or desats related to that but for now do not believe pulmoary intervention needed    Christinia Gully, MD Pulmonary and [place] [phone] After 5:30 PM or weekends, use [facility] [phone]

## 2018-09-05 NOTE — Progress Notes (Signed)
[facility]   Reason for visit: Follow up on HIV, pancytopenia  Antimicrobials: Mepron daily Azithromycin q week biktarvy daily  Interval History: 2 units of platelets transfused today. Pt asking about when she will receive IVIG as was scheduled for Friday as OP. SOB resolved after blood transfusions in first 24 hrs of admisision. Appetite improving. Minimal ambulation today though. CD4 count and viral load trends, fever curves, imaging, CBCs, ABX administration, pulmonary and hematology notes all independently reviewed    Current Facility-Administered Medications:    0.9 %  sodium chloride infusion, 250 mL, Intravenous, PRN, Opyd, Ilene Qua, MD, Stopped at 09/05/18 1507   acetaminophen (TYLENOL) tablet 650 mg, 650 mg, Oral, Q6H PRN **OR** acetaminophen (TYLENOL) suppository 650 mg, 650 mg, Rectal, Q6H PRN, Opyd, Ilene Qua, MD   atovaquone (MEPRON) 750 MG/5ML suspension 1,500 mg, 1,500 mg, Oral, Q breakfast, Sariah Henkin, Evern Core, MD, 1,500 mg at 09/04/18 2252   [START ON 09/10/2018] azithromycin (ZITHROMAX) tablet 1,200 mg, 1,200 mg, Oral, Weekly, Eyob Godlewski, Evern Core, MD   B-complex with vitamin C tablet 1 tablet, 1 tablet, Oral, Daily, Opyd, Ilene Qua, MD, 1 tablet at 09/05/18 1003   bictegravir-emtricitabine-tenofovir AF (BIKTARVY) 50-200-25 MG per tablet 1 tablet, 1 tablet, Oral, Daily, Opyd, Ilene Qua, MD, 1 tablet at 09/05/18 1122   Chlorhexidine Gluconate Cloth 2 % PADS 6 each, 6 each, Topical, Daily, Opyd, Ilene Qua, MD, 6 each at 40/98/11 9147   folic acid (FOLVITE) tablet 2 mg, 2 mg, Oral, Daily, Opyd, Ilene Qua, MD, 2 mg at 09/05/18 1003   guaiFENesin (ROBITUSSIN) 100 MG/5ML solution 100 mg, 5 mL, Oral, Q4H PRN, Opyd, Ilene Qua, MD, 100 mg at 09/04/18 0315   HYDROcodone-acetaminophen (NORCO/VICODIN) 5-325 MG per tablet 1-2 tablet, 1-2 tablet, Oral, Q4H PRN, Opyd, Ilene Qua, MD, 1 tablet at 09/05/18 0248   polyethylene glycol (MIRALAX / GLYCOLAX)  packet 17 g, 17 g, Oral, Daily PRN, Opyd, Ilene Qua, MD   predniSONE (DELTASONE) tablet 15 mg, 15 mg, Oral, Q breakfast, Ollis, Brandi L, NP, 15 mg at 09/05/18 0809   sodium chloride flush (NS) 0.9 % injection 10-40 mL, 10-40 mL, Intracatheter, Q12H, Opyd, Ilene Qua, MD, 10 mL at 09/05/18 1005   sodium chloride flush (NS) 0.9 % injection 10-40 mL, 10-40 mL, Intracatheter, PRN, Opyd, Ilene Qua, MD   sodium chloride flush (NS) 0.9 % injection 3 mL, 3 mL, Intravenous, Q12H, Opyd, Ilene Qua, MD, 3 mL at 09/05/18 1005   sodium chloride flush (NS) 0.9 % injection 3 mL, 3 mL, Intravenous, Q12H, Opyd, Ilene Qua, MD, 3 mL at 09/05/18 1004   sodium chloride flush (NS) 0.9 % injection 3 mL, 3 mL, Intravenous, PRN, Opyd, Ilene Qua, MD   Physical Exam:   Vitals:   09/05/18 1300 09/05/18 1400  BP: (!) 115/49 137/65  Pulse: (!) 136 (!) 138  Resp: (!) 33 (!) 34  Temp:    SpO2: 94% 94%   Physical Exam Gen: pleasant, NAD, A&Ox 3 Head: NCAT, no temporal wasting evident EENT: PERRL, EOMI, MMM, adequate dentition Neck: supple, no JVD CV: NRRR, no murmurs evident Pulm: CTA bilaterally, no wheeze or retractions Abd: soft, NTND, +BS Extrems: trace LE edema, 2+ pulses Skin: multiple skin lesions to face, neck, and upper chest c/w molluscum contagiosum, adequate skin turgor Neuro: CN II-XII grossly intact, no focal neurologic deficits appreciated, gait was not assessed, A&Ox 3  Review of Systems:  Review of Systems  Constitutional: Positive for malaise/fatigue. Negative  for chills, fever and weight loss.  HENT: Negative for congestion, hearing loss, sinus pain and sore throat.   Eyes: Negative for blurred vision, photophobia and discharge.  Respiratory: Positive for shortness of breath. Negative for cough and hemoptysis.        SOB improved after blood transfusion  Cardiovascular: Negative for chest pain, palpitations, orthopnea and leg swelling.  Gastrointestinal: Positive for nausea and  vomiting. Negative for abdominal pain, constipation, diarrhea and heartburn.       X 1 as OP  Genitourinary: Negative for dysuria, flank pain, frequency and urgency.  Musculoskeletal: Negative for back pain, joint pain and myalgias.  Skin: Positive for rash. Negative for itching.       Known molluscum contagiosum to face  Neurological: Positive for weakness. Negative for tremors, seizures and headaches.  Endo/Heme/Allergies: Negative for polydipsia. Does not bruise/bleed easily.  Psychiatric/Behavioral: Positive for depression. Negative for substance abuse. The patient is not nervous/anxious and does not have insomnia.      Lab Results  Component Value Date   WBC 22.4 (H) 09/05/2018   HGB 8.9 (L) 09/05/2018   HCT 27.1 (L) 09/05/2018   MCV 89.1 09/05/2018   PLT 27 (LL) 09/05/2018    Lab Results  Component Value Date   CREATININE 0.93 09/05/2018   BUN 23 (H) 09/05/2018   NA 131 (L) 09/05/2018   K 4.2 09/05/2018   CL 106 09/05/2018   CO2 16 (L) 09/05/2018    Lab Results  Component Value Date   ALT 13 09/04/2018   AST 22 09/04/2018   ALKPHOS 142 (H) 09/04/2018     Microbiology: Recent Results (from the past 240 hour(s))  Urine culture     Status: None   Collection Time: 09/03/18  4:27 PM   Specimen: In/Out Cath Urine  Result Value Ref Range Status   Specimen Description   Final    IN/OUT CATH URINE Performed at [facility], [place] [address], [place], [place] [address]    Special Requests   Final    NONE Performed at [facility], [place] [address], [place], [place] [address]    Culture   Final    NO GROWTH Performed at [facility], [place] [address], [place], [place] [address]    Report Status 09/04/2018 FINAL  Final  SARS Coronavirus 2 (CEPHEID- Performed in [facility] hospital lab), Hosp Order     Status: None   Collection Time: 09/03/18  4:30 PM   Specimen: Nasopharyngeal Swab  Result Value Ref Range Status   SARS  Coronavirus 2 NEGATIVE NEGATIVE Final    Comment: (NOTE) If result is NEGATIVE SARS-CoV-2 target nucleic acids are NOT DETECTED. The SARS-CoV-2 RNA is generally detectable in upper and lower  respiratory specimens during the acute phase of infection. The lowest  concentration of SARS-CoV-2 viral copies this assay can detect is 250  copies / mL. A negative result does not preclude SARS-CoV-2 infection  and should not be used as the sole basis for treatment or other  patient management decisions.  A negative result may occur with  improper specimen collection / handling, submission of specimen other  than nasopharyngeal swab, presence of viral mutation(s) within the  areas targeted by this assay, and inadequate number of viral copies  (<250 copies / mL). A negative result must be combined with clinical  observations, patient history, and epidemiological information. If result is POSITIVE SARS-CoV-2 target nucleic acids are DETECTED. The SARS-CoV-2 RNA is generally  detectable in upper and lower  respiratory specimens dur ing the acute phase of infection.  Positive  results are indicative of active infection with SARS-CoV-2.  Clinical  correlation with patient history and other diagnostic information is  necessary to determine patient infection status.  Positive results do  not rule out bacterial infection or co-infection with other viruses. If result is PRESUMPTIVE POSTIVE SARS-CoV-2 nucleic acids MAY BE PRESENT.   A presumptive positive result was obtained on the submitted specimen  and confirmed on repeat testing.  While 2019 novel coronavirus  (SARS-CoV-2) nucleic acids may be present in the submitted sample  additional confirmatory testing may be necessary for epidemiological  and / or clinical management purposes  to differentiate between  SARS-CoV-2 and other Sarbecovirus currently known to infect humans.  If clinically indicated additional testing with an alternate test    methodology [phone]) is advised. The SARS-CoV-2 RNA is generally  detectable in upper and lower respiratory sp ecimens during the acute  phase of infection. The expected result is Negative. Fact Sheet for Patients:  StrictlyIdeas.no Fact Sheet for Healthcare Providers: BankingDealers.co.za This test is not yet approved or cleared by the [place] FDA and has been authorized for detection and/or diagnosis of SARS-CoV-2 by FDA under an Emergency Use Authorization (EUA).  This EUA will remain in effect (meaning this test can be used) for the duration of the COVID-19 declaration under Section 564(b)(1) of the Act, 21 U.S.C. section 360bbb-3(b)(1), unless the authorization is terminated or revoked sooner. Performed at [facility], [place] [address], [place], [place] [address]   Blood Culture (routine x 2)     Status: None (Preliminary result)   Collection Time: 09/03/18  5:58 PM   Specimen: BLOOD  Result Value Ref Range Status   Specimen Description   Final    BLOOD LEFT ANTECUBITAL Performed at [facility] [address] [place], [place] [address]    Special Requests   Final    BOTTLES DRAWN AEROBIC ONLY Blood Culture adequate volume Performed at [facility] [address] [place], [place] [address]    Culture   Final    NO GROWTH 2 DAYS Performed at [facility] [address] [place], [place] [address]    Report Status PENDING  Incomplete  Blood Culture (routine x 2)     Status: None (Preliminary result)   Collection Time: 09/03/18  5:58 PM   Specimen: BLOOD  Result Value Ref Range Status   Specimen Description   Final    BLOOD RIGHT ANTECUBITAL Performed at [facility] [address] [place], [place] [address]    Special Requests   Final    BOTTLES DRAWN AEROBIC ONLY Blood Culture adequate volume Performed at [facility] [address] [place], [place] [address]    Culture   Final    NO GROWTH 2 DAYS Performed at [facility] [address], [place], [place] [address]    Report Status PENDING  Incomplete  MRSA PCR Screening     Status: None   Collection Time: 09/03/18 10:14 PM   Specimen: Nasopharyngeal  Result Value Ref Range Status   MRSA by PCR NEGATIVE NEGATIVE Final    Comment:        The GeneXpert MRSA Assay (FDA approved for NASAL specimens only), is one component of a comprehensive MRSA colonization surveillance program. It is not intended to diagnose MRSA infection nor to  guide or monitor treatment for MRSA infections. Performed at [facility], [place] [address], [place], [place] [address]   Respiratory Panel by PCR     Status: None   Collection Time: 09/03/18 10:14 PM   Specimen: Nasopharyngeal Swab; Respiratory  Result Value Ref Range Status   Adenovirus NOT DETECTED NOT DETECTED Final   Coronavirus 229E NOT DETECTED NOT DETECTED Final    Comment: (NOTE) The Coronavirus on the Respiratory Panel, DOES NOT test for the novel  Coronavirus (2019 nCoV)    Coronavirus HKU1 NOT DETECTED NOT DETECTED Final   Coronavirus NL63 NOT DETECTED NOT DETECTED Final   Coronavirus OC43 NOT DETECTED NOT DETECTED Final   Metapneumovirus NOT DETECTED NOT DETECTED Final   Rhinovirus / Enterovirus NOT DETECTED NOT DETECTED Final   Influenza A NOT DETECTED NOT DETECTED Final   Influenza B NOT DETECTED NOT DETECTED Final   Parainfluenza Virus 1 NOT DETECTED NOT DETECTED Final   Parainfluenza Virus 2 NOT DETECTED NOT DETECTED Final   Parainfluenza Virus 3 NOT DETECTED NOT DETECTED Final   Parainfluenza Virus 4 NOT DETECTED NOT DETECTED Final   Respiratory Syncytial Virus NOT DETECTED NOT DETECTED Final   Bordetella pertussis NOT DETECTED NOT DETECTED Final   Chlamydophila pneumoniae NOT DETECTED NOT DETECTED Final   Mycoplasma pneumoniae NOT DETECTED NOT DETECTED  Final    Comment: Performed at [facility], [place]. [address] [place], [place] [address]    Impression/Plan: The patient is a 33 y/o AAF with HIV/AIDS, pancytopenia, with relapsing episodes of SOB/?PNA now on chronic steroids re-admitted with severe anemia and SOB.  1. SOB - Given repeated admissions for respiratory complaints with negative BAL studies and transbronchial biopsy in 6/20 that failed to show a cause for her illnesses, recommend proceeding with an open lung biopsy as an OP as it is ill-advised to leave this patient on chronic steroids without a confirmed diagnosis that would warrant such treatment. I discussed pt's case extensively with pulmonary team yesterday. They and I agree that the patient's symptomatology appears most related to her profound anemia as her symptoms all resolved with blood transfusions given. Her CXR shows chronic findings and given her initial absence of fever, these findings are unlikely to represent a new PNA. For this reason, I have stopped all ABX aside from her weekly azithromycin for MAC prophylaxis and mepron for PCP prophylaxis and observe. As this a relapsing symptom for her, I agree with pulmonary with the plan to wean her steroids with the goal to have her off steroids for 1-2 weeks, allowing for open lung biopsy as OP while she is off steroids. Discussed recommendations with hospitalist yesterday as well. Peculiarly, her respiratory issues did not begin until after Christmas last year though even though she had AIDS that was responding to ARV treatment prior to that time. Serologic work up for possible autoimmune vasculitic causes on her last admission were mostly unrevealing as well, but these labs were drawn while the patient was actively receiving steroids. She completed her third empiric 21 day course of tx for PCP (which has never been formally diagnosed) most recently withclindamycin and primaquine earlier this month. Weaning from prednisone has been  slower than a standard 21 day PCP taper due to hematology's concerns for her idiopathic pancytopenia. She currently is receiving 15 mg of prednisone daily (down from 30 mg daily at the time of discharge from her hospitalization earlier this month and down from 20 mg at the time of her  admisision here). Will defer steroid taper regimen to hematology, hoping they recognize that prolonged prednisone treatment will impair her ability to develop adequate CD4 reconstitution and thus help her avoid further OIs. 2. Pancytopenia - I remain concerned about her advanced pancytopenia and mostly transfusion dependent state for the past 5-6 weeks. Her previous bone marrow cx in 6/20 was mostly non-diagnostic with the exception of some rare hemophagocytic elements, raising this concern (?histocytosis X). If a repeat bone marrow biopsy is performed, would advise that in addition to specimen sent for pathology/cytology that adequate sample also be sent for routine, fungal, AFB, and viral cultures (this typically will require ~10 cc total or more as each culture usually needs ~2 cc for sample size to adequate). Staining for microorganisms on bone marrow often will miss opportunistic infections (with the exception of silver staining for PCP), and thus would be inadequate to exclude the possibility of a marrow-infiltrative infection without cxs. In early 7/20 quantitative CMV-PCR from blood was only 265 IU, more consistent with role as a commensal infection in this immunosuppressed patient rather than a primary/causal role in her pancytopenia and pulmonary process. In either of the latter 2 scenarios, CMV-PCR would be expected to exceed 10,000 IU, typically. Will defer treatment with valcyte as this medication can be myelosuppressive as well. Recommend re-consulting hematology as platelet count has failed to improve and we need a tapering schedule for her steroids as CD4 reconstitution will remain impaired the longer she remains on  these medications. She has no clear cause of IRIS or underlying opportunistic infection to explain why she would need steroids from an ID standpoint. Similarly, expanded serologic work up for infectious causes of disseminated infection has been unrevealing. Wthin this past month, she has had negative cryptococcal serum Ag, Aspergillus galactomannan, fungitell, routine blood cxs, and her AFB blood cx remains negative (confirmed with micro labthisafternoon) from this admission as well at this time. Check urine histoplasma Ag.  3. HIV - The patient was initialy diagnosed in 4/19. Her peak CD4 count was 70, 6 % in 8/19 but declined to <35, 3 % as of 06/2018). Continue Biktarvy 1 tab daily. HIV viremia remains fully suppressed at < 20 copies(as of 08/15/2018)as has been the case for the past 8 months. Will repeat CD4 count here to re-assess but expect to be low given ongoing chronic steroids impeding CD4 reconstitution. No evidence of IRIS as no underlying opportunistic infection has been uncovered despite extensive serologic work up.

## 2018-09-05 NOTE — Progress Notes (Signed)
CRITICAL VALUE ALERT  Critical Value:  Platelet count 27  Date & Time Notied:  09/05/2018 1420  Provider Notified: yes, DO Choi  Orders Received/Actions taken: Awaiting orders

## 2018-09-05 NOTE — Progress Notes (Signed)
PROGRESS NOTE    Krystal Short  JEH:[id number] DOB: Oct 11, 1985 DOA: 09/03/2018 PCP: Lajean Manes, MD     Brief Narrative:  Krystal Short is a 33 y.o. female with medical history significant for AIDS and recent recurrent admissions for pneumonia, now returning to the emergency department for evaluation of generalized weakness, fatigue, low blood pressure, and elevated heart rate.  Patient was admitted to the hospital last month and discharged on 08/22/2018 after treatment of recurrent atypical pneumonia complicated by pancytopenia, acute kidney injury, and hyponatremia.  During the recent admission, she was treated for suspected pneumocystis pneumonia, transfused 7 units of platelets and 8 units of RBC, and reports that she was feeling much better when she returned home.  She began to develop generalized weakness and fatigue approximately 5 days ago, progressively worsening.  Also admits to some intermittent dry cough, shortness of breath with exertion.  In the emergency department, chest x-ray revealed fine reticulonodular interstitial opacity bilaterally, similar to most recent chest radiograph, likely of infectious etiology. Suspect multifocal atypical organism pneumonia.  Hemoglobin 5.3, platelets 16,000.  Patient was given 2 units packed red blood cell as well as 1 unit platelet.  She was started on empiric vancomycin, cefepime, clindamycin.  Infectious disease, oncology, pulmonology consulted.  New events last 24 hours / Subjective: Just getting back in bed after using the restroom, tachycardic with some exertional dyspnea.  Assessment & Plan:   Principal Problem:   Sepsis due to pneumonia [facility]) Active Problems:   Hyponatremia   AIDS (acquired immune deficiency syndrome) (HCC)   Symptomatic anemia   Thrombocytopenia (HCC)   Generalized weakness, deconditioning likely in setting of anemia, thrombocytopenia   -Hgb is 5.3 and platelets 16k on admission without bleeding  -Underwent  extensive inpatient workup during recent admission with unclear etiology; she received 8 units RBC and platelet transfusion x7 during that admission  -Status post 2 unit packed red blood cell, 1 pack platelet -Appreciate oncology.  Patient follows with Dr. Irene Limbo -Prednisone -slowly wean -Transfuse 2 packed platelets today and monitor CBC -?IVIG   Atypical pneumonia -Recent hospitalization and discharged on 08/22/2018: At that time autoimmune, vasculitis panel were unremarkable, fungal work-up negative, bronchoscopy on 6/5 revealed scant lung parenchyma with fibrosis and reactive changes.  There was concern for PCP during that admission and patient was started on primaquine, clindamycin which completed while inpatient.  ID at that point recommended open lung biopsy if stable from thrombocytopenia standpoint.  Deferred antiviral treatment for CMV as this was felt to be consistent with commensal infection rather than primary/causal role, Valcyte can also be myelosuppressive -Respiratory viral panel, COVID-19 negative -Strep pneumo antigen negative -MRSA PCR negative -Blood cultures negative growth to date -Legionella antigen pending -QuantiFERON-TB gold pending -Appreciate infectious disease, pulmonology -Antibiotics stopped at this point -Pulmonology planning to follow-up outpatient for any progression of pulmonary infiltrates but for right now, no pulmonary intervention planned  HIV/AIDS  -CD4 41 on 07/12/18 and VL undetectable on 08/15/18 -Continue Biktarvy   -Mepron, Zithromax for prophylaxis  Hyponatremia  -Stable       DVT prophylaxis: SCD Code Status: Full code Family Communication: None Disposition Plan: Pending further improvement   Consultants:   Infectious disease  Oncology  Pulmonology   Procedures:   None  Antimicrobials:  Anti-infectives (From admission, onward)   Start     Dose/Rate Route Frequency Ordered Stop   09/10/18 0800  azithromycin (ZITHROMAX)  tablet 1,200 mg     1,200 mg Oral Weekly 09/04/18 1517  09/04/18 1600  atovaquone (MEPRON) 750 MG/5ML suspension 1,500 mg     1,500 mg Oral Daily with breakfast 09/04/18 1517     09/04/18 1000  bictegravir-emtricitabine-tenofovir AF (BIKTARVY) 50-200-25 MG per tablet 1 tablet     1 tablet Oral Daily 09/03/18 2122     09/04/18 1000  primaquine tablet 30 mg  Status:  Discontinued     30 mg Oral Daily 09/03/18 2220 09/03/18 2222   09/04/18 0200  ceFEPIme (MAXIPIME) 2 g in sodium chloride 0.9 % 100 mL IVPB  Status:  Discontinued     2 g 200 mL/hr over 30 Minutes Intravenous Every 8 hours 09/03/18 2010 09/04/18 1429   09/03/18 2200  azithromycin (ZITHROMAX) 500 mg in sodium chloride 0.9 % 250 mL IVPB  Status:  Discontinued     500 mg 250 mL/hr over 60 Minutes Intravenous Daily at bedtime 09/03/18 2122 09/04/18 1429   09/03/18 2200  clindamycin (CLEOCIN) IVPB 900 mg  Status:  Discontinued     900 mg 100 mL/hr over 30 Minutes Intravenous Every 8 hours 09/03/18 2122 09/04/18 1051   09/03/18 2130  primaquine tablet 30 mg  Status:  Discontinued     30 mg Oral Daily 09/03/18 2122 09/04/18 1350   09/03/18 1500  vancomycin (VANCOCIN) 1,250 mg in sodium chloride 0.9 % 250 mL IVPB     1,250 mg 166.7 mL/hr over 90 Minutes Intravenous  Once 09/03/18 1452 09/04/18 0714   09/03/18 1445  ceFEPIme (MAXIPIME) 2 g in sodium chloride 0.9 % 100 mL IVPB     2 g 200 mL/hr over 30 Minutes Intravenous  Once 09/03/18 1444 09/03/18 1922   09/03/18 1445  metroNIDAZOLE (FLAGYL) IVPB 500 mg     500 mg 100 mL/hr over 60 Minutes Intravenous  Once 09/03/18 1444 09/03/18 1922   09/03/18 1445  vancomycin (VANCOCIN) IVPB 1000 mg/200 mL premix  Status:  Discontinued     1,000 mg 200 mL/hr over 60 Minutes Intravenous  Once 09/03/18 1444 09/03/18 1452       Objective: Vitals:   09/05/18 0930 09/05/18 0958 09/05/18 1000 09/05/18 1015  BP: 114/81  (!) 116/30 (!) 154/81  Pulse:  (!) 134  (!) 139  Resp: (!) 42 (!) 34  (!) 36 (!) 31  Temp: 97.7 F (36.5 C) 98.4 F (36.9 C)  97.8 F (36.6 C)  TempSrc: Oral Oral  Oral  SpO2: 95%   95%  Weight:      Height:        Intake/Output Summary (Last 24 hours) at 09/05/2018 1040 Last data filed at 09/05/2018 0930 Gross per 24 hour  Intake 215.44 ml  Output 450 ml  Net -234.56 ml   Filed Weights   09/03/18 1404  Weight: 52.5 kg    Examination: General exam: Appears calm and comfortable  Respiratory system: Clear to auscultation. Respiratory effort normal.  Remains tachypneic Cardiovascular system: S1 & S2 heard, tachycardic, regular rhythm. No JVD, murmurs, rubs, gallops or clicks. No pedal edema. Gastrointestinal system: Abdomen is nondistended, soft and nontender. No organomegaly or masses felt. Normal bowel sounds heard. Central nervous system: Alert and oriented. No focal neurological deficits. Extremities: Symmetric 5 x 5 power. Skin: + Molluscum Psychiatry: Judgement and insight appear normal. Mood & affect appropriate.   Data Reviewed: I have personally reviewed following labs and imaging studies  CBC: Recent Labs  Lab 08/29/18 1226 09/03/18 1627 09/04/18 0630 09/05/18 0640  WBC 5.7 18.0* 25.0* 20.3*  NEUTROABS 3.3 13.2* 20.1*  --  HGB 8.2* 5.3* 9.3* 8.8*  HCT 26.0* 16.6* 28.5* 27.5*  MCV 92.2 91.7 91.3 89.9  PLT 26* 16* 20* 12*   Basic Metabolic Panel: Recent Labs  Lab 08/29/18 1226 09/03/18 1627 09/04/18 0630 09/05/18 0640  NA 131* 127* 132* 131*  K 4.0 4.7 4.6 4.2  CL 101 100 107 106  CO2 21* 17* 16* 16*  GLUCOSE 169* 133* 140* 103*  BUN 13 17 18  23*  CREATININE 0.84 0.89 0.88 0.93  CALCIUM 8.4* 7.1* 7.0* 7.6*   GFR: Estimated Creatinine Clearance: 64.6 mL/min (by C-G formula based on SCr of 0.93 mg/dL). Liver Function Tests: Recent Labs  Lab 08/29/18 1226 09/03/18 1627 09/04/18 0630  AST 45* 23 22  ALT 34 14 13  ALKPHOS 238* 143* 142*  BILITOT 1.9* 1.7* 2.1*  PROT 7.1 6.0* 6.2*  ALBUMIN 2.1* 1.7* 1.7*    No results for input(s): LIPASE, AMYLASE in the last 168 hours. No results for input(s): AMMONIA in the last 168 hours. Coagulation Profile: Recent Labs  Lab 09/03/18 1758  INR 1.4*   Cardiac Enzymes: No results for input(s): CKTOTAL, CKMB, CKMBINDEX, TROPONINI in the last 168 hours. BNP (last 3 results) No results for input(s): PROBNP in the last 8760 hours. HbA1C: No results for input(s): HGBA1C in the last 72 hours. CBG: Recent Labs  Lab 09/04/18 0734 09/05/18 0739  GLUCAP 134* 88   Lipid Profile: No results for input(s): CHOL, HDL, LDLCALC, TRIG, CHOLHDL, LDLDIRECT in the last 72 hours. Thyroid Function Tests: No results for input(s): TSH, T4TOTAL, FREET4, T3FREE, THYROIDAB in the last 72 hours. Anemia Panel: Recent Labs    09/05/18 0640  VITAMINB12 633  FOLATE 17.8  FERRITIN 4,590*  TIBC 146*  IRON 15*  RETICCTPCT 0.7   Sepsis Labs: Recent Labs  Lab 09/03/18 1758  LATICACIDVEN 1.4    Recent Results (from the past 240 hour(s))  Urine culture     Status: None   Collection Time: 09/03/18  4:27 PM   Specimen: In/Out Cath Urine  Result Value Ref Range Status   Specimen Description   Final    IN/OUT CATH URINE Performed at [facility], [place] [address] [place], [place] [address]    Special Requests   Final    NONE Performed at [facility], [place] [address], [place], [place] [address]    Culture   Final    NO GROWTH Performed at [facility], [place] [address] [place], [place] [address]    Report Status 09/04/2018 FINAL  Final  SARS Coronavirus 2 (CEPHEID- Performed in [place] hospital lab), Hosp Order     Status: None   Collection Time: 09/03/18  4:30 PM   Specimen: Nasopharyngeal Swab  Result Value Ref Range Status   SARS Coronavirus 2 NEGATIVE NEGATIVE Final    Comment: (NOTE) If result is NEGATIVE SARS-CoV-2 target nucleic acids are NOT DETECTED. The SARS-CoV-2 RNA is generally detectable in upper and  lower  respiratory specimens during the acute phase of infection. The lowest  concentration of SARS-CoV-2 viral copies this assay can detect is 250  copies / mL. A negative result does not preclude SARS-CoV-2 infection  and should not be used as the sole basis for treatment or other  patient management decisions.  A negative result may occur with  improper specimen collection / handling, submission of specimen other  than nasopharyngeal swab, presence of viral mutation(s) within the  areas targeted by this assay, and inadequate number  of viral copies  (<250 copies / mL). A negative result must be combined with clinical  observations, patient history, and epidemiological information. If result is POSITIVE SARS-CoV-2 target nucleic acids are DETECTED. The SARS-CoV-2 RNA is generally detectable in upper and lower  respiratory specimens dur ing the acute phase of infection.  Positive  results are indicative of active infection with SARS-CoV-2.  Clinical  correlation with patient history and other diagnostic information is  necessary to determine patient infection status.  Positive results do  not rule out bacterial infection or co-infection with other viruses. If result is PRESUMPTIVE POSTIVE SARS-CoV-2 nucleic acids MAY BE PRESENT.   A presumptive positive result was obtained on the submitted specimen  and confirmed on repeat testing.  While 2019 novel coronavirus  (SARS-CoV-2) nucleic acids may be present in the submitted sample  additional confirmatory testing may be necessary for epidemiological  and / or clinical management purposes  to differentiate between  SARS-CoV-2 and other Sarbecovirus currently known to infect humans.  If clinically indicated additional testing with an alternate test  methodology [phone]) is advised. The SARS-CoV-2 RNA is generally  detectable in upper and lower respiratory sp ecimens during the acute  phase of infection. The expected result is Negative.  Fact Sheet for Patients:  StrictlyIdeas.no Fact Sheet for Healthcare Providers: BankingDealers.co.za This test is not yet approved or cleared by the [place] FDA and has been authorized for detection and/or diagnosis of SARS-CoV-2 by FDA under an Emergency Use Authorization (EUA).  This EUA will remain in effect (meaning this test can be used) for the duration of the COVID-19 declaration under Section 564(b)(1) of the Act, 21 U.S.C. section 360bbb-3(b)(1), unless the authorization is terminated or revoked sooner. Performed at [facility], [place] [address], [place], [place] [address]   Blood Culture (routine x 2)     Status: None (Preliminary result)   Collection Time: 09/03/18  5:58 PM   Specimen: BLOOD  Result Value Ref Range Status   Specimen Description   Final    BLOOD LEFT ANTECUBITAL Performed at [facility] [address], [place], [place] [address]    Special Requests   Final    BOTTLES DRAWN AEROBIC ONLY Blood Culture adequate volume Performed at [place] [address] [place], [place] [address]    Culture   Final    NO GROWTH 2 DAYS Performed at [facility] [address] [place], [place] [address]    Report Status PENDING  Incomplete  Blood Culture (routine x 2)     Status: None (Preliminary result)   Collection Time: 09/03/18  5:58 PM   Specimen: BLOOD  Result Value Ref Range Status   Specimen Description   Final    BLOOD RIGHT ANTECUBITAL Performed at [place] [address], [place], [place] [address]    Special Requests   Final    BOTTLES DRAWN AEROBIC ONLY Blood Culture adequate volume Performed at [facility] [address] [place], [place] [address]    Culture   Final    NO GROWTH 2 DAYS Performed at [facility] [address] [place],  [address]    Report Status  PENDING  Incomplete  MRSA PCR Screening     Status: None   Collection Time: 09/03/18 10:14 PM   Specimen: Nasopharyngeal  Result Value Ref Range Status   MRSA by PCR NEGATIVE NEGATIVE Final    Comment:  The GeneXpert MRSA Assay (FDA approved for NASAL specimens only), is one component of a comprehensive MRSA colonization surveillance program. It is not intended to diagnose MRSA infection nor to guide or monitor treatment for MRSA infections. Performed at [facility], [place] [address], [place], [place] [address]   Respiratory Panel by PCR     Status: None   Collection Time: 09/03/18 10:14 PM   Specimen: Nasopharyngeal Swab; Respiratory  Result Value Ref Range Status   Adenovirus NOT DETECTED NOT DETECTED Final   Coronavirus 229E NOT DETECTED NOT DETECTED Final    Comment: (NOTE) The Coronavirus on the Respiratory Panel, DOES NOT test for the novel  Coronavirus (2019 nCoV)    Coronavirus HKU1 NOT DETECTED NOT DETECTED Final   Coronavirus NL63 NOT DETECTED NOT DETECTED Final   Coronavirus OC43 NOT DETECTED NOT DETECTED Final   Metapneumovirus NOT DETECTED NOT DETECTED Final   Rhinovirus / Enterovirus NOT DETECTED NOT DETECTED Final   Influenza A NOT DETECTED NOT DETECTED Final   Influenza B NOT DETECTED NOT DETECTED Final   Parainfluenza Virus 1 NOT DETECTED NOT DETECTED Final   Parainfluenza Virus 2 NOT DETECTED NOT DETECTED Final   Parainfluenza Virus 3 NOT DETECTED NOT DETECTED Final   Parainfluenza Virus 4 NOT DETECTED NOT DETECTED Final   Respiratory Syncytial Virus NOT DETECTED NOT DETECTED Final   Bordetella pertussis NOT DETECTED NOT DETECTED Final   Chlamydophila pneumoniae NOT DETECTED NOT DETECTED Final   Mycoplasma pneumoniae NOT DETECTED NOT DETECTED Final    Comment: Performed at [facility], [place]. [address], [place], [place] [address]      Radiology Studies: Dg Chest Port 1 View  Result Date: 09/03/2018 CLINICAL DATA:  Cough  and fever.  History of HIV disease EXAM: PORTABLE CHEST 1 VIEW COMPARISON:  August 12, 2018 chest radiograph; CT angiogram chest August 02, 2018 FINDINGS: There remains fine reticulonodular interstitial disease throughout the lungs, primarily in the mid and lower lung zones. The appearance is similar to most recent chest radiograph with less opacity overall compared to most recent CT. No new opacity evident. No consolidation. Heart size and pulmonary vascularity are normal. No adenopathy. No bone lesions. IMPRESSION: Fine reticulonodular interstitial opacity bilaterally, similar to most recent chest radiograph, likely of infectious etiology. Suspect multifocal atypical organism pneumonia. No consolidation. No new opacity. No adenopathy evident. Electronically Signed   By: Lowella Grip III M.D.   On: 09/03/2018 15:39      Scheduled Meds: . atovaquone  1,500 mg Oral Q breakfast  . [START ON 09/10/2018] azithromycin  1,200 mg Oral Weekly  . B-complex with vitamin C  1 tablet Oral Daily  . bictegravir-emtricitabine-tenofovir AF  1 tablet Oral Daily  . Chlorhexidine Gluconate Cloth  6 each Topical Daily  . folic acid  2 mg Oral Daily  . predniSONE  15 mg Oral Q breakfast  . sodium chloride flush  10-40 mL Intracatheter Q12H  . sodium chloride flush  3 mL Intravenous Q12H  . sodium chloride flush  3 mL Intravenous Q12H   Continuous Infusions: . sodium chloride Stopped (09/04/18 1119)  . sodium chloride       LOS: 2 days      Time spent: 35 minutes   Dessa Phi, DO [facility] [email or web address] 09/05/2018, 10:40 AM

## 2018-09-06 ENCOUNTER — Inpatient Hospital Stay: Payer: 59

## 2018-09-06 DIAGNOSIS — R0602 Shortness of breath: Secondary | ICD-10-CM

## 2018-09-06 DIAGNOSIS — R509 Fever, unspecified: Secondary | ICD-10-CM

## 2018-09-06 DIAGNOSIS — F329 Major depressive disorder, single episode, unspecified: Secondary | ICD-10-CM

## 2018-09-06 DIAGNOSIS — Z9889 Other specified postprocedural states: Secondary | ICD-10-CM

## 2018-09-06 DIAGNOSIS — B081 Molluscum contagiosum: Secondary | ICD-10-CM

## 2018-09-06 DIAGNOSIS — Z7952 Long term (current) use of systemic steroids: Secondary | ICD-10-CM

## 2018-09-06 DIAGNOSIS — D61818 Other pancytopenia: Secondary | ICD-10-CM

## 2018-09-06 LAB — QUANTIFERON-TB GOLD PLUS (RQFGPL)
QuantiFERON Mitogen Value: 0.02 IU/mL
QuantiFERON Nil Value: 0.01 IU/mL
QuantiFERON TB1 Ag Value: 0.02 IU/mL
QuantiFERON TB2 Ag Value: 0.01 IU/mL

## 2018-09-06 LAB — CBC
HCT: 24.1 % — ABNORMAL LOW (ref 36.0–46.0)
Hemoglobin: 8.1 g/dL — ABNORMAL LOW (ref 12.0–15.0)
MCH: 29.6 pg (ref 26.0–34.0)
MCHC: 33.6 g/dL (ref 30.0–36.0)
MCV: 88 fL (ref 80.0–100.0)
Platelets: 12 10*3/uL — CL (ref 150–400)
RBC: 2.74 MIL/uL — ABNORMAL LOW (ref 3.87–5.11)
RDW: 17.4 % — ABNORMAL HIGH (ref 11.5–15.5)
WBC: 14.9 10*3/uL — ABNORMAL HIGH (ref 4.0–10.5)
nRBC: 1.2 % — ABNORMAL HIGH (ref 0.0–0.2)

## 2018-09-06 LAB — PREPARE PLATELET PHERESIS
Unit division: 0
Unit division: 0

## 2018-09-06 LAB — QUANTIFERON-TB GOLD PLUS: QuantiFERON-TB Gold Plus: UNDETERMINED — AB

## 2018-09-06 LAB — GLUCOSE, CAPILLARY
Glucose-Capillary: 46 mg/dL — ABNORMAL LOW (ref 70–99)
Glucose-Capillary: 56 mg/dL — ABNORMAL LOW (ref 70–99)
Glucose-Capillary: 190 mg/dL — ABNORMAL HIGH (ref 70–99)
Glucose-Capillary: 58 mg/dL — ABNORMAL LOW (ref 70–99)

## 2018-09-06 LAB — CBC WITH DIFFERENTIAL/PLATELET
Abs Immature Granulocytes: 2.61 10*3/uL — ABNORMAL HIGH (ref 0.00–0.07)
Basophils Absolute: 0.1 10*3/uL (ref 0.0–0.1)
Basophils Relative: 0 %
Eosinophils Absolute: 0 10*3/uL (ref 0.0–0.5)
Eosinophils Relative: 0 %
HCT: 26.3 % — ABNORMAL LOW (ref 36.0–46.0)
Hemoglobin: 8.3 g/dL — ABNORMAL LOW (ref 12.0–15.0)
Immature Granulocytes: 11 %
Lymphocytes Relative: 6 %
Lymphs Abs: 1.4 10*3/uL (ref 0.7–4.0)
MCH: 29.2 pg (ref 26.0–34.0)
MCHC: 31.6 g/dL (ref 30.0–36.0)
MCV: 92.6 fL (ref 80.0–100.0)
Monocytes Absolute: 1.2 10*3/uL — ABNORMAL HIGH (ref 0.1–1.0)
Monocytes Relative: 5 %
Neutro Abs: 18.9 10*3/uL — ABNORMAL HIGH (ref 1.7–7.7)
Neutrophils Relative %: 78 %
Platelets: 22 10*3/uL — CL (ref 150–400)
RBC: 2.84 MIL/uL — ABNORMAL LOW (ref 3.87–5.11)
RDW: 18 % — ABNORMAL HIGH (ref 11.5–15.5)
WBC: 24.2 10*3/uL — ABNORMAL HIGH (ref 4.0–10.5)
nRBC: 0.8 % — ABNORMAL HIGH (ref 0.0–0.2)

## 2018-09-06 LAB — BPAM PLATELET PHERESIS
Blood Product Expiration Date: 202007312359
Blood Product Expiration Date: 202007312359
ISSUE DATE / TIME: 202007300754
ISSUE DATE / TIME: 202007300952
Unit Type and Rh: 5100
Unit Type and Rh: 7300

## 2018-09-06 LAB — HAPTOGLOBIN: Haptoglobin: 291 mg/dL — ABNORMAL HIGH (ref 33–278)

## 2018-09-06 MED ORDER — SODIUM CHLORIDE 0.9% IV SOLUTION
Freq: Once | INTRAVENOUS | Status: AC
  Administered 2018-09-06: 08:00:00 via INTRAVENOUS

## 2018-09-06 MED ORDER — METOPROLOL TARTRATE 5 MG/5ML IV SOLN: 5.0000 mg | INTRAVENOUS | Status: DC | PRN

## 2018-09-06 MED ORDER — DEXTROSE 50 % IV SOLN
INTRAVENOUS | Status: AC
  Filled 2018-09-06: qty 50

## 2018-09-06 MED ORDER — METOPROLOL TARTRATE 25 MG PO TABS
25.0000 mg | ORAL_TABLET | Freq: Two times a day (BID) | ORAL | Status: DC
  Administered 2018-09-06 – 2018-09-13 (×13): 25 mg via ORAL
  Filled 2018-09-06 (×15): qty 1

## 2018-09-06 MED ORDER — DEXTROSE 50 % IV SOLN
1.0000 | Freq: Once | INTRAVENOUS | Status: AC
  Administered 2018-09-06: 50 mL via INTRAVENOUS

## 2018-09-06 NOTE — Progress Notes (Signed)
Hypoglycemic Event  CBG: 46  Treatment: Two 4 oz juice cups given  Symptoms: Cold, shaky   Follow-up CBG: 0810 CBG Result: 56  2nd Treatment: Another two 4 oz juice cups given  Follow-up CBG: 0835 CBG: 58  3rd Treatment: 1 amp Dextrose 50 given see MAR.   Follow-up CBG: 0920 CBG: 190   Possible Reasons for Event: dehydration, poor appetite   Comments/MD notified: yes MD notified    Krystal Short

## 2018-09-06 NOTE — Progress Notes (Signed)
[facility]   Reason for visit: Follow up on HIV, pancytopenia  Antimicrobials: Mepron daily Azithromycin q week biktarvy daily  Prednisone 15 mg daily  Interval History: IVIG given last PM with fever near completion of dose. She also had a fever today when given another platelet transfusion. She denies any hematuria, vaginal spotting, hematemesis, or bloody stools. Appetite remains stable from yesterday. Minimal ambulation today due to weakness. Fever curve, ABX usage, Hgb/platelet count trends, imaging, and medications all independently reviewed    Current Facility-Administered Medications:    0.9 %  sodium chloride infusion, 250 mL, Intravenous, PRN, Opyd, Ilene Qua, MD, Stopped at 09/05/18 1507   acetaminophen (TYLENOL) tablet 650 mg, 650 mg, Oral, Q6H PRN, 650 mg at 09/06/18 1140 **OR** acetaminophen (TYLENOL) suppository 650 mg, 650 mg, Rectal, Q6H PRN, Opyd, Ilene Qua, MD   atovaquone (MEPRON) 750 MG/5ML suspension 1,500 mg, 1,500 mg, Oral, Q breakfast, Charly Hunton, Evern Core, MD, 1,500 mg at 09/05/18 2206   [START ON 09/10/2018] azithromycin (ZITHROMAX) tablet 1,200 mg, 1,200 mg, Oral, Weekly, Zariana Strub, Evern Core, MD   B-complex with vitamin C tablet 1 tablet, 1 tablet, Oral, Daily, Opyd, Ilene Qua, MD, 1 tablet at 09/06/18 0953   bictegravir-emtricitabine-tenofovir AF (BIKTARVY) 50-200-25 MG per tablet 1 tablet, 1 tablet, Oral, Daily, Opyd, Ilene Qua, MD, 1 tablet at 09/06/18 0102   Chlorhexidine Gluconate Cloth 2 % PADS 6 each, 6 each, Topical, Daily, Opyd, Ilene Qua, MD, 6 each at 72/53/66 4403   folic acid (FOLVITE) tablet 2 mg, 2 mg, Oral, Daily, Opyd, Ilene Qua, MD, 2 mg at 09/06/18 0954   guaiFENesin (ROBITUSSIN) 100 MG/5ML solution 100 mg, 5 mL, Oral, Q4H PRN, Opyd, Ilene Qua, MD, 100 mg at 09/04/18 0315   HYDROcodone-acetaminophen (NORCO/VICODIN) 5-325 MG per tablet 1-2 tablet, 1-2 tablet, Oral, Q4H PRN, Opyd, Ilene Qua, MD, 1 tablet at 09/06/18  1128   Immune Globulin 10% (PRIVIGEN) IV infusion 50 g, 1 g/kg, Intravenous, Q24 Hr x 2, Dessa Phi, DO, Stopped at 09/06/18 0330   metoprolol tartrate (LOPRESSOR) injection 5 mg, 5 mg, Intravenous, Q5 min PRN, Dessa Phi, DO   metoprolol tartrate (LOPRESSOR) tablet 25 mg, 25 mg, Oral, BID, Dessa Phi, DO, 25 mg at 09/06/18 1223   polyethylene glycol (MIRALAX / GLYCOLAX) packet 17 g, 17 g, Oral, Daily PRN, Opyd, Ilene Qua, MD   predniSONE (DELTASONE) tablet 15 mg, 15 mg, Oral, Q breakfast, Ollis, Brandi L, NP, 15 mg at 09/06/18 0801   sodium chloride flush (NS) 0.9 % injection 10-40 mL, 10-40 mL, Intracatheter, Q12H, Opyd, Ilene Qua, MD, 10 mL at 09/05/18 1005   sodium chloride flush (NS) 0.9 % injection 10-40 mL, 10-40 mL, Intracatheter, PRN, Opyd, Ilene Qua, MD   sodium chloride flush (NS) 0.9 % injection 3 mL, 3 mL, Intravenous, Q12H, Opyd, Ilene Qua, MD, 3 mL at 09/05/18 1005   sodium chloride flush (NS) 0.9 % injection 3 mL, 3 mL, Intravenous, Q12H, Opyd, Timothy S, MD, 3 mL at 09/05/18 2200   sodium chloride flush (NS) 0.9 % injection 3 mL, 3 mL, Intravenous, PRN, Opyd, Ilene Qua, MD   Physical Exam:   Vitals:   09/06/18 1400 09/06/18 1550  BP: 127/63   Pulse: (!) 114   Resp: (!) 25   Temp:  97.9 F (36.6 C)  SpO2: 97%    Physical Exam Gen: pleasant, NAD, A&Ox 3 Head: NCAT, no temporal wasting evident EENT: PERRL, EOMI, MMM, adequate dentition Neck: supple, no  JVD CV: NRRR, no murmurs evident Pulm: CTA bilaterally, no wheeze or retractions Abd: soft, NTND, +BS Extrems: trace LE edema, 2+ pulses Skin: multiple skin lesions to face, neck, and upper chest c/w molluscum contagiosum, adequate skin turgor Neuro: CN II-XII grossly intact, no focal neurologic deficits appreciated, gait was not assessed, A&Ox 3  Review of Systems:  Review of Systems  Constitutional: Positive for fever. Negative for chills and weight loss.  HENT: Negative for congestion,  hearing loss, sinus pain and sore throat.   Eyes: Negative for blurred vision, photophobia and discharge.  Respiratory: Positive for shortness of breath. Negative for cough and hemoptysis.   Cardiovascular: Negative for chest pain, palpitations, orthopnea and leg swelling.  Gastrointestinal: Positive for nausea. Negative for abdominal pain, constipation, diarrhea, heartburn and vomiting.  Genitourinary: Negative for dysuria, flank pain, frequency and urgency.  Musculoskeletal: Negative for back pain, joint pain and myalgias.  Skin: Positive for rash. Negative for itching.       Chronic molluscum contagiosum to face  Neurological: Positive for weakness. Negative for tremors, seizures and headaches.  Endo/Heme/Allergies: Negative for polydipsia. Bruises/bleeds easily.  Psychiatric/Behavioral: Positive for depression. Negative for substance abuse. The patient is not nervous/anxious and does not have insomnia.      Lab Results  Component Value Date   WBC 14.9 (H) 09/06/2018   HGB 8.1 (L) 09/06/2018   HCT 24.1 (L) 09/06/2018   MCV 88.0 09/06/2018   PLT 12 (LL) 09/06/2018    Lab Results  Component Value Date   CREATININE 0.93 09/05/2018   BUN 23 (H) 09/05/2018   NA 131 (L) 09/05/2018   K 4.2 09/05/2018   CL 106 09/05/2018   CO2 16 (L) 09/05/2018    Lab Results  Component Value Date   ALT 13 09/04/2018   AST 22 09/04/2018   ALKPHOS 142 (H) 09/04/2018     Microbiology: Recent Results (from the past 240 hour(s))  Urine culture     Status: None   Collection Time: 09/03/18  4:27 PM   Specimen: In/Out Cath Urine  Result Value Ref Range Status   Specimen Description   Final    IN/OUT CATH URINE Performed at [facility], [place] [address], [place], [place] [address]    Special Requests   Final    NONE Performed at [facility], [place] [address] [place], [place] [address]    Culture   Final    NO GROWTH Performed at [facility],  [place] [address] [place], [place] [address]    Report Status 09/04/2018 FINAL  Final  SARS Coronavirus 2 (CEPHEID- Performed in [facility] hospital lab), Hosp Order     Status: None   Collection Time: 09/03/18  4:30 PM   Specimen: Nasopharyngeal Swab  Result Value Ref Range Status   SARS Coronavirus 2 NEGATIVE NEGATIVE Final    Comment: (NOTE) If result is NEGATIVE SARS-CoV-2 target nucleic acids are NOT DETECTED. The SARS-CoV-2 RNA is generally detectable in upper and lower  respiratory specimens during the acute phase of infection. The lowest  concentration of SARS-CoV-2 viral copies this assay can detect is 250  copies / mL. A negative result does not preclude SARS-CoV-2 infection  and should not be used as the sole basis for treatment or other  patient management decisions.  A negative result may occur with  improper specimen collection / handling, submission of specimen other  than nasopharyngeal swab, presence of viral mutation(s) within the  areas targeted  by this assay, and inadequate number of viral copies  (<250 copies / mL). A negative result must be combined with clinical  observations, patient history, and epidemiological information. If result is POSITIVE SARS-CoV-2 target nucleic acids are DETECTED. The SARS-CoV-2 RNA is generally detectable in upper and lower  respiratory specimens dur ing the acute phase of infection.  Positive  results are indicative of active infection with SARS-CoV-2.  Clinical  correlation with patient history and other diagnostic information is  necessary to determine patient infection status.  Positive results do  not rule out bacterial infection or co-infection with other viruses. If result is PRESUMPTIVE POSTIVE SARS-CoV-2 nucleic acids MAY BE PRESENT.   A presumptive positive result was obtained on the submitted specimen  and confirmed on repeat testing.  While 2019 novel coronavirus  (SARS-CoV-2) nucleic acids may be present in the submitted  sample  additional confirmatory testing may be necessary for epidemiological  and / or clinical management purposes  to differentiate between  SARS-CoV-2 and other Sarbecovirus currently known to infect humans.  If clinically indicated additional testing with an alternate test  methodology [id number]) is advised. The SARS-CoV-2 RNA is generally  detectable in upper and lower respiratory sp ecimens during the acute  phase of infection. The expected result is Negative. Fact Sheet for Patients:  StrictlyIdeas.no Fact Sheet for Healthcare Providers: BankingDealers.co.za This test is not yet approved or cleared by the [place] FDA and has been authorized for detection and/or diagnosis of SARS-CoV-2 by FDA under an Emergency Use Authorization (EUA).  This EUA will remain in effect (meaning this test can be used) for the duration of the COVID-19 declaration under Section 564(b)(1) of the Act, 21 U.S.C. section 360bbb-3(b)(1), unless the authorization is terminated or revoked sooner. Performed at [facility], [place] [address] [place], [place] [address]   Blood Culture (routine x 2)     Status: None (Preliminary result)   Collection Time: 09/03/18  5:58 PM   Specimen: BLOOD  Result Value Ref Range Status   Specimen Description   Final    BLOOD LEFT ANTECUBITAL Performed at [facility] [address], [place], [place] [address]    Special Requests   Final    BOTTLES DRAWN AEROBIC ONLY Blood Culture adequate volume Performed at [facility] [address], [place], [place] [address]    Culture   Final    NO GROWTH 3 DAYS Performed at [facility], [place] [address] [place], [place] [address]    Report Status PENDING  Incomplete  Blood Culture (routine x 2)     Status: None (Preliminary result)   Collection Time: 09/03/18  5:58 PM   Specimen: BLOOD  Result Value Ref Range  Status   Specimen Description   Final    BLOOD RIGHT ANTECUBITAL Performed at [facility] [address], [place], [place] [address]    Special Requests   Final    BOTTLES DRAWN AEROBIC ONLY Blood Culture adequate volume Performed at [facility] [address], [place], [place] [address]    Culture   Final    NO GROWTH 3 DAYS Performed at [facility], [place] [address] [place], [place] [address]    Report Status PENDING  Incomplete  MRSA PCR Screening     Status: None   Collection Time: 09/03/18 10:14 PM   Specimen: Nasopharyngeal  Result Value Ref Range Status   MRSA by PCR NEGATIVE  NEGATIVE Final    Comment:        The GeneXpert MRSA Assay (FDA approved for NASAL specimens only), is one component of a comprehensive MRSA colonization surveillance program. It is not intended to diagnose MRSA infection nor to guide or monitor treatment for MRSA infections. Performed at [facility], [place] [address], [place], [place] [address]   Respiratory Panel by PCR     Status: None   Collection Time: 09/03/18 10:14 PM   Specimen: Nasopharyngeal Swab; Respiratory  Result Value Ref Range Status   Adenovirus NOT DETECTED NOT DETECTED Final   Coronavirus 229E NOT DETECTED NOT DETECTED Final    Comment: (NOTE) The Coronavirus on the Respiratory Panel, DOES NOT test for the novel  Coronavirus (2019 nCoV)    Coronavirus HKU1 NOT DETECTED NOT DETECTED Final   Coronavirus NL63 NOT DETECTED NOT DETECTED Final   Coronavirus OC43 NOT DETECTED NOT DETECTED Final   Metapneumovirus NOT DETECTED NOT DETECTED Final   Rhinovirus / Enterovirus NOT DETECTED NOT DETECTED Final   Influenza A NOT DETECTED NOT DETECTED Final   Influenza B NOT DETECTED NOT DETECTED Final   Parainfluenza Virus 1 NOT DETECTED NOT DETECTED Final   Parainfluenza Virus 2 NOT DETECTED NOT DETECTED Final   Parainfluenza Virus 3 NOT DETECTED NOT DETECTED Final    Parainfluenza Virus 4 NOT DETECTED NOT DETECTED Final   Respiratory Syncytial Virus NOT DETECTED NOT DETECTED Final   Bordetella pertussis NOT DETECTED NOT DETECTED Final   Chlamydophila pneumoniae NOT DETECTED NOT DETECTED Final   Mycoplasma pneumoniae NOT DETECTED NOT DETECTED Final    Comment: Performed at [facility], [place]. [address], [place], [place] [address]    Impression/Plan: The patient is a 34 y/o AAF with HIV/AIDS, pancytopenia, with relapsing episodes of SOB/?PNA now on chronic steroids re-admitted with severe anemia and SOB.  1. SOB - Given repeated admissions for respiratory complaints with negative BAL studies and transbronchial biopsy in 6/20 that failed to show a cause for her illnesses, recommend proceeding with an open lung biopsy as an OP as it is ill-advised to leave this patient on chronic steroids without a confirmed diagnosis that would warrant such treatment. I discussed pt's case extensively with pulmonary team the day after her current admisison. They and I agree that the patient's symptomatology appears most related to her profound anemia as her symptoms all resolved with blood transfusions given. Her CXR shows chronic findings and given her initial absence of fever, these findings are unlikely to represent a new PNA. For this reason, I stopped all ABX aside from her weekly azithromycin for MAC prophylaxis and mepron for PCP prophylaxis and observe. As this a relapsing symptom for her, I agree with pulmonary with the plan to wean her steroids with goal to have her off steroids for 1-2 weeks, allowing for open lung biopsy as OP while she is off steroids. Discussed recommendations with hospitalist on day of initial re-consultation as well. Peculiarly, her respiratory issues did not begin until after Christmas last year though. Serologic work up for possible autoimmune vasculitic causes on her last admission were mostly unrevealing as well but were drawn while the patient  was actively receiving steroids. She completed her third empiric 21 day course of tx for PCP (which has never been formally diagnosed) most recently with clindamycin and primaquine. Weaning from prednisone has been slower than a standard 21 day PCP taper due to hematology's concerns for her idiopathic pancytopenia. She currently is receiving  15 mg of prednisone daily (down from 30 mg daily at the time of discharge from her hospitalization earlier this month. Will defer steroid taper regimen to hematology, hoping they recognize that prolonged prednisone treatment will impair her ability to develop adequate CD4 reconstitution and thus help her avoid further OIs. 2. Pancytopenia - I remain concerned about her advanced pancytopenia and mostly transfusion dependent state for the past 5-6 weeks. Her previous bone marrow cx in 6/20 was most non-diagnostic with the exception of some rare hemophagocytic elements, raising this concern (?histocytosis X). If a repeat bone marrow biopsy is performed, would advise that in addition to specimen sent for pathology/cytology that adequate sample also be sent for routine, fungal, AFB, and viral cultures (this typically will require ~10 cc total or more as each culture usually needs ~2cc for sample to adequate). Staining for microorganisms on bone marrow often will miss opportunistic infections (with the exception of silver staining for PCP), and thus would be inadequate to exclude the possibility of a marrow-infiltrative infection without cxs. In early 7/20 quantitative CMV-PCR from blood was only 265 IU, more consistent with role as a commensal infection in this immunosuppressed patient rather than a primary/causal role in her pancytopenia and pulmonary process. In either of the latter 2 scenarios, CMV-PCR would be expected to exceed 10,000 IU, typically. Will defer treatment with valcyte as this medication can be myelosuppressive as well. Recommend re-consulting hematology as  platelet count has failed to improve and we need a tapering schedule for her steroids as CD4 reconstitution will remain impaired the longer she remains on these medications. She has no clear cause of IRIS or underlying opportunistic infection to explain why she would need steroids from an ID standpoint. Similarly, expanded serologic work up for infectious causes of disseminated infection has been unrevealing. Wthin this past month, she has had negative cryptococcal serum Ag, Aspergillus galactomannan, fungitell, routine blood cxs, and her AFB blood cx remains negative (confirmed with micro lab yesterday afternoon) from this admission as well at this time. Check urine histoplasma Ag.  3. HIV - The patient was initialy diagnosed in 4/19. Her peak CD4 count was 70, 6 % in 8/19 but declined to <35, 3 % as of 06/2018). Continue Biktarvy 1 tab daily. HIV viremia remains fully suppressed at < 20 copies (as of 08/15/2018) as has been the case for the past 8 months. Will repeat CD4 count here to re-assess but expect to be low given ongoing chronic steroids impeding CD4 reconstitution. No evidence of IRIS as no underlying opportunistic infection has been uncovered despite extensive serologic work up.  4. Fever - Uncertain this is infectious in nature as both episodes in the past 24 hrs were associated with either IVIG or blood products. Will repeat blood cxs x 2 and repeat a quantitative CMV-PCR to exclude infectious etiologies. She has tested negative for CoVID-19 several times in the past month. Throughout most of her past 2 admissions thus far, she has required PRN blood transfusions (either PRBCs or platelets) every 2-3 days, but hem/onc believes this may be due to her primaquine used for empiric PCP tx and expect to rebound once she is off this treatment. Also, they were concerned for hemolysis while she was on dapsone, so re-initiated PCP prophylaxis with mepron 1500 mg daily after she completed her third and most recent  empiric PCP treatment course earlier this month.

## 2018-09-06 NOTE — Progress Notes (Signed)
PROGRESS NOTE    Cabrini Ruggieri  WNU:[id number] DOB: Dec 24, 1985 DOA: 09/03/2018 PCP: Lajean Manes, MD     Brief Narrative:  Krystal Short is a 33 y.o. female with medical history significant for AIDS and recent recurrent admissions for pneumonia, now returning to the emergency department for evaluation of generalized weakness, fatigue, low blood pressure, and elevated heart rate.  Patient was admitted to the hospital last month and discharged on 08/22/2018 after treatment of recurrent atypical pneumonia complicated by pancytopenia, acute kidney injury, and hyponatremia.  During the recent admission, she was treated for suspected pneumocystis pneumonia, transfused 7 units of platelets and 8 units of RBC, and reports that she was feeling much better when she returned home.  She began to develop generalized weakness and fatigue approximately 5 days ago, progressively worsening.  Also admits to some intermittent dry cough, shortness of breath with exertion.  In the emergency department, chest x-ray revealed fine reticulonodular interstitial opacity bilaterally, similar to most recent chest radiograph, likely of infectious etiology. Suspect multifocal atypical organism pneumonia.  Hemoglobin 5.3, platelets 16,000.  Patient was given 2 units packed red blood cell as well as 1 unit platelet.  She was started on empiric vancomycin, cefepime, clindamycin.  Infectious disease, oncology, pulmonology consulted.  New events last 24 hours / Subjective: Fever last night after receiving IVIG.    Assessment & Plan:   Principal Problem:   Sepsis due to pneumonia [facility]) Active Problems:   Hyponatremia   AIDS (acquired immune deficiency syndrome) (HCC)   Symptomatic anemia   Thrombocytopenia (HCC)   Anemia   Generalized weakness, deconditioning likely in setting of anemia, thrombocytopenia   -Hgb is 5.3 and platelets 16k on admission without bleeding  -Underwent extensive inpatient workup during recent  admission with unclear etiology; she received 8 units RBC and platelet transfusion x7 during that admission  -Status post 2 unit packed red blood cell, 3 pack platelet so far this admission -Appreciate oncology.  Patient follows with Dr. Irene Limbo -Prednisone -slowly wean -Transfuse 2 packed platelets today.  Trend CBC -IVIG x2   Atypical pneumonia -Recent hospitalization and discharged on 08/22/2018: At that time autoimmune, vasculitis panel were unremarkable, fungal work-up negative, bronchoscopy on 6/5 revealed scant lung parenchyma with fibrosis and reactive changes.  There was concern for PCP during that admission and patient was started on primaquine, clindamycin which completed while inpatient.  ID at that point recommended open lung biopsy if stable from thrombocytopenia standpoint.  Deferred antiviral treatment for CMV as this was felt to be consistent with commensal infection rather than primary/causal role, Valcyte can also be myelosuppressive -Respiratory viral panel, COVID-19 negative -Strep pneumo antigen negative -MRSA PCR negative -Blood cultures negative growth to date -Legionella antigen negative -QuantiFERON-TB gold pending -Appreciate infectious disease, pulmonology -Antibiotics stopped at this point -Pulmonology planning to follow-up outpatient for any progression of pulmonary infiltrates but for right now, no pulmonary intervention planned  HIV/AIDS  -CD4 41 on 07/12/18 and VL undetectable on 08/15/18 -Continue Biktarvy   -Mepron, Zithromax for prophylaxis  Hyponatremia  -Stable       DVT prophylaxis: SCD Code Status: Full code Family Communication: None Disposition Plan: Pending further improvement   Consultants:   Infectious disease  Oncology  Pulmonology   Procedures:   None  Antimicrobials:  Anti-infectives (From admission, onward)   Start     Dose/Rate Route Frequency Ordered Stop   09/10/18 0800  azithromycin (ZITHROMAX) tablet 1,200 mg      1,200 mg Oral Weekly 09/04/18 1517  09/04/18 1600  atovaquone (MEPRON) 750 MG/5ML suspension 1,500 mg     1,500 mg Oral Daily with breakfast 09/04/18 1517     09/04/18 1000  bictegravir-emtricitabine-tenofovir AF (BIKTARVY) 50-200-25 MG per tablet 1 tablet     1 tablet Oral Daily 09/03/18 2122     09/04/18 1000  primaquine tablet 30 mg  Status:  Discontinued     30 mg Oral Daily 09/03/18 2220 09/03/18 2222   09/04/18 0200  ceFEPIme (MAXIPIME) 2 g in sodium chloride 0.9 % 100 mL IVPB  Status:  Discontinued     2 g 200 mL/hr over 30 Minutes Intravenous Every 8 hours 09/03/18 2010 09/04/18 1429   09/03/18 2200  azithromycin (ZITHROMAX) 500 mg in sodium chloride 0.9 % 250 mL IVPB  Status:  Discontinued     500 mg 250 mL/hr over 60 Minutes Intravenous Daily at bedtime 09/03/18 2122 09/04/18 1429   09/03/18 2200  clindamycin (CLEOCIN) IVPB 900 mg  Status:  Discontinued     900 mg 100 mL/hr over 30 Minutes Intravenous Every 8 hours 09/03/18 2122 09/04/18 1051   09/03/18 2130  primaquine tablet 30 mg  Status:  Discontinued     30 mg Oral Daily 09/03/18 2122 09/04/18 1350   09/03/18 1500  vancomycin (VANCOCIN) 1,250 mg in sodium chloride 0.9 % 250 mL IVPB     1,250 mg 166.7 mL/hr over 90 Minutes Intravenous  Once 09/03/18 1452 09/04/18 0714   09/03/18 1445  ceFEPIme (MAXIPIME) 2 g in sodium chloride 0.9 % 100 mL IVPB     2 g 200 mL/hr over 30 Minutes Intravenous  Once 09/03/18 1444 09/03/18 1922   09/03/18 1445  metroNIDAZOLE (FLAGYL) IVPB 500 mg     500 mg 100 mL/hr over 60 Minutes Intravenous  Once 09/03/18 1444 09/03/18 1922   09/03/18 1445  vancomycin (VANCOCIN) IVPB 1000 mg/200 mL premix  Status:  Discontinued     1,000 mg 200 mL/hr over 60 Minutes Intravenous  Once 09/03/18 1444 09/03/18 1452       Objective: Vitals:   09/06/18 1030 09/06/18 1100 09/06/18 1118 09/06/18 1137  BP: 126/62 121/64 132/63 128/67  Pulse: (!) 128 (!) 130 (!) 130 (!) 130  Resp: (!) 34 (!) 37 (!) 36 (!)  34  Temp: 100.1 F (37.8 C)  98.8 F (37.1 C) (!) 101.1 F (38.4 C)  TempSrc: Oral  Oral Oral  SpO2: 98% 94% 97% 99%  Weight:      Height:        Intake/Output Summary (Last 24 hours) at 09/06/2018 1209 Last data filed at 09/06/2018 1030 Gross per 24 hour  Intake 1215.39 ml  Output -  Net 1215.39 ml   Filed Weights   09/03/18 1404  Weight: 52.5 kg    Examination: General exam: Appears calm and comfortable  Respiratory system: Clear to auscultation. Respiratory effort normal.  Tachypneic Cardiovascular system: S1 & S2 heard, tachycardic, regular rhythm. No JVD, murmurs, rubs, gallops or clicks. No pedal edema. Gastrointestinal system: Abdomen is nondistended, soft and nontender. No organomegaly or masses felt. Normal bowel sounds heard. Central nervous system: Alert and oriented. No focal neurological deficits. Extremities: Symmetric 5 x 5 power. Psychiatry: Judgement and insight appear normal. Mood & affect appropriate.    Data Reviewed: I have personally reviewed following labs and imaging studies  CBC: Recent Labs  Lab 09/03/18 1627 09/04/18 0630 09/05/18 0640 09/05/18 1320 09/06/18 0601  WBC 18.0* 25.0* 20.3* 22.4* 14.9*  NEUTROABS 13.2* 20.1*  --  16.7*  --   HGB 5.3* 9.3* 8.8* 8.9* 8.1*  HCT 16.6* 28.5* 27.5* 27.1* 24.1*  MCV 91.7 91.3 89.9 89.1 88.0  PLT 16* 20* 12* 27* 12*   Basic Metabolic Panel: Recent Labs  Lab 09/03/18 1627 09/04/18 0630 09/05/18 0640  NA 127* 132* 131*  K 4.7 4.6 4.2  CL 100 107 106  CO2 17* 16* 16*  GLUCOSE 133* 140* 103*  BUN 17 18 23*  CREATININE 0.89 0.88 0.93  CALCIUM 7.1* 7.0* 7.6*   GFR: Estimated Creatinine Clearance: 64.6 mL/min (by C-G formula based on SCr of 0.93 mg/dL). Liver Function Tests: Recent Labs  Lab 09/03/18 1627 09/04/18 0630  AST 23 22  ALT 14 13  ALKPHOS 143* 142*  BILITOT 1.7* 2.1*  PROT 6.0* 6.2*  ALBUMIN 1.7* 1.7*   No results for input(s): LIPASE, AMYLASE in the last 168 hours. No  results for input(s): AMMONIA in the last 168 hours. Coagulation Profile: Recent Labs  Lab 09/03/18 1758  INR 1.4*   Cardiac Enzymes: No results for input(s): CKTOTAL, CKMB, CKMBINDEX, TROPONINI in the last 168 hours. BNP (last 3 results) No results for input(s): PROBNP in the last 8760 hours. HbA1C: No results for input(s): HGBA1C in the last 72 hours. CBG: Recent Labs  Lab 09/05/18 0739 09/06/18 0749 09/06/18 0813 09/06/18 0835 09/06/18 0927  GLUCAP 88 46* 56* 58* 190*   Lipid Profile: No results for input(s): CHOL, HDL, LDLCALC, TRIG, CHOLHDL, LDLDIRECT in the last 72 hours. Thyroid Function Tests: No results for input(s): TSH, T4TOTAL, FREET4, T3FREE, THYROIDAB in the last 72 hours. Anemia Panel: Recent Labs    09/05/18 0640  VITAMINB12 633  FOLATE 17.8  FERRITIN 4,590*  TIBC 146*  IRON 15*  RETICCTPCT 0.7   Sepsis Labs: Recent Labs  Lab 09/03/18 1758  LATICACIDVEN 1.4    Recent Results (from the past 240 hour(s))  Urine culture     Status: None   Collection Time: 09/03/18  4:27 PM   Specimen: In/Out Cath Urine  Result Value Ref Range Status   Specimen Description   Final    IN/OUT CATH URINE Performed at [facility], [place] [address], [place], [place] [address]    Special Requests   Final    NONE Performed at [facility], [place] [address], [place], [place] [address]    Culture   Final    NO GROWTH Performed at [facility], [place] [address] [place], [place] [address]    Report Status 09/04/2018 FINAL  Final  SARS Coronavirus 2 (CEPHEID- Performed in [place] hospital lab), Hosp Order     Status: None   Collection Time: 09/03/18  4:30 PM   Specimen: Nasopharyngeal Swab  Result Value Ref Range Status   SARS Coronavirus 2 NEGATIVE NEGATIVE Final    Comment: (NOTE) If result is NEGATIVE SARS-CoV-2 target nucleic acids are NOT DETECTED. The SARS-CoV-2 RNA is generally detectable in upper and lower   respiratory specimens during the acute phase of infection. The lowest  concentration of SARS-CoV-2 viral copies this assay can detect is 250  copies / mL. A negative result does not preclude SARS-CoV-2 infection  and should not be used as the sole basis for treatment or other  patient management decisions.  A negative result may occur with  improper specimen collection / handling, submission of specimen other  than nasopharyngeal swab, presence of viral mutation(s) within the  areas targeted by this assay, and inadequate number  of viral copies  (<250 copies / mL). A negative result must be combined with clinical  observations, patient history, and epidemiological information. If result is POSITIVE SARS-CoV-2 target nucleic acids are DETECTED. The SARS-CoV-2 RNA is generally detectable in upper and lower  respiratory specimens dur ing the acute phase of infection.  Positive  results are indicative of active infection with SARS-CoV-2.  Clinical  correlation with patient history and other diagnostic information is  necessary to determine patient infection status.  Positive results do  not rule out bacterial infection or co-infection with other viruses. If result is PRESUMPTIVE POSTIVE SARS-CoV-2 nucleic acids MAY BE PRESENT.   A presumptive positive result was obtained on the submitted specimen  and confirmed on repeat testing.  While 2019 novel coronavirus  (SARS-CoV-2) nucleic acids may be present in the submitted sample  additional confirmatory testing may be necessary for epidemiological  and / or clinical management purposes  to differentiate between  SARS-CoV-2 and other Sarbecovirus currently known to infect humans.  If clinically indicated additional testing with an alternate test  methodology [phone]) is advised. The SARS-CoV-2 RNA is generally  detectable in upper and lower respiratory sp ecimens during the acute  phase of infection. The expected result is Negative. Fact  Sheet for Patients:  StrictlyIdeas.no Fact Sheet for Healthcare Providers: BankingDealers.co.za This test is not yet approved or cleared by the [place] FDA and has been authorized for detection and/or diagnosis of SARS-CoV-2 by FDA under an Emergency Use Authorization (EUA).  This EUA will remain in effect (meaning this test can be used) for the duration of the COVID-19 declaration under Section 564(b)(1) of the Act, 21 U.S.C. section 360bbb-3(b)(1), unless the authorization is terminated or revoked sooner. Performed at [facility], [place] [address], [place], [place] [address]   Blood Culture (routine x 2)     Status: None (Preliminary result)   Collection Time: 09/03/18  5:58 PM   Specimen: BLOOD  Result Value Ref Range Status   Specimen Description   Final    BLOOD LEFT ANTECUBITAL Performed at [place] [address] [place], [place] [address]    Special Requests   Final    BOTTLES DRAWN AEROBIC ONLY Blood Culture adequate volume Performed at [place] [address], [place], [place] [address]    Culture   Final    NO GROWTH 2 DAYS Performed at [place] [address] [place], [place] [address]    Report Status PENDING  Incomplete  Blood Culture (routine x 2)     Status: None (Preliminary result)   Collection Time: 09/03/18  5:58 PM   Specimen: BLOOD  Result Value Ref Range Status   Specimen Description   Final    BLOOD RIGHT ANTECUBITAL Performed at [place] [address], [place], [place] [address]    Special Requests   Final    BOTTLES DRAWN AEROBIC ONLY Blood Culture adequate volume Performed at [place] [address] [place], [place] [address]    Culture   Final    NO GROWTH 2 DAYS Performed at [place] [address] [place], [place] [address]    Report Status PENDING   Incomplete  MRSA PCR Screening     Status: None   Collection Time: 09/03/18 10:14 PM   Specimen: Nasopharyngeal  Result Value Ref Range Status   MRSA by PCR NEGATIVE NEGATIVE Final    Comment:  The GeneXpert MRSA Assay (FDA approved for NASAL specimens only), is one component of a comprehensive MRSA colonization surveillance program. It is not intended to diagnose MRSA infection nor to guide or monitor treatment for MRSA infections. Performed at [facility], [place] [address] [place], [place] [address]   Respiratory Panel by PCR     Status: None   Collection Time: 09/03/18 10:14 PM   Specimen: Nasopharyngeal Swab; Respiratory  Result Value Ref Range Status   Adenovirus NOT DETECTED NOT DETECTED Final   Coronavirus 229E NOT DETECTED NOT DETECTED Final    Comment: (NOTE) The Coronavirus on the Respiratory Panel, DOES NOT test for the novel  Coronavirus (2019 nCoV)    Coronavirus HKU1 NOT DETECTED NOT DETECTED Final   Coronavirus NL63 NOT DETECTED NOT DETECTED Final   Coronavirus OC43 NOT DETECTED NOT DETECTED Final   Metapneumovirus NOT DETECTED NOT DETECTED Final   Rhinovirus / Enterovirus NOT DETECTED NOT DETECTED Final   Influenza A NOT DETECTED NOT DETECTED Final   Influenza B NOT DETECTED NOT DETECTED Final   Parainfluenza Virus 1 NOT DETECTED NOT DETECTED Final   Parainfluenza Virus 2 NOT DETECTED NOT DETECTED Final   Parainfluenza Virus 3 NOT DETECTED NOT DETECTED Final   Parainfluenza Virus 4 NOT DETECTED NOT DETECTED Final   Respiratory Syncytial Virus NOT DETECTED NOT DETECTED Final   Bordetella pertussis NOT DETECTED NOT DETECTED Final   Chlamydophila pneumoniae NOT DETECTED NOT DETECTED Final   Mycoplasma pneumoniae NOT DETECTED NOT DETECTED Final    Comment: Performed at [facility], [place]. [address] [place],  [address]      Radiology Studies: No results found.    Scheduled Meds: . atovaquone  1,500 mg Oral Q  breakfast  . [START ON 09/10/2018] azithromycin  1,200 mg Oral Weekly  . B-complex with vitamin C  1 tablet Oral Daily  . bictegravir-emtricitabine-tenofovir AF  1 tablet Oral Daily  . Chlorhexidine Gluconate Cloth  6 each Topical Daily  . folic acid  2 mg Oral Daily  . Immune Globulin 10%  1 g/kg Intravenous Q24 Hr x 2  . predniSONE  15 mg Oral Q breakfast  . sodium chloride flush  10-40 mL Intracatheter Q12H  . sodium chloride flush  3 mL Intravenous Q12H  . sodium chloride flush  3 mL Intravenous Q12H   Continuous Infusions: . sodium chloride Stopped (09/05/18 1507)     LOS: 3 days      Time spent: 35 minutes   Dessa Phi, DO [facility] [email or web address] 09/06/2018, 12:09 PM

## 2018-09-07 DIAGNOSIS — D649 Anemia, unspecified: Secondary | ICD-10-CM

## 2018-09-07 DIAGNOSIS — B20 Human immunodeficiency virus [HIV] disease: Secondary | ICD-10-CM

## 2018-09-07 LAB — CBC
HCT: 25 % — ABNORMAL LOW (ref 36.0–46.0)
HCT: 23.4 % — ABNORMAL LOW (ref 36.0–46.0)
Hemoglobin: 8 g/dL — ABNORMAL LOW (ref 12.0–15.0)
Hemoglobin: 7.4 g/dL — ABNORMAL LOW (ref 12.0–15.0)
MCH: 29.4 pg (ref 26.0–34.0)
MCH: 28.8 pg (ref 26.0–34.0)
MCHC: 32 g/dL (ref 30.0–36.0)
MCHC: 31.6 g/dL (ref 30.0–36.0)
MCV: 91.9 fL (ref 80.0–100.0)
MCV: 91.1 fL (ref 80.0–100.0)
Platelets: 15 10*3/uL — CL (ref 150–400)
Platelets: 18 10*3/uL — CL (ref 150–400)
RBC: 2.72 MIL/uL — ABNORMAL LOW (ref 3.87–5.11)
RBC: 2.57 MIL/uL — ABNORMAL LOW (ref 3.87–5.11)
RDW: 17.9 % — ABNORMAL HIGH (ref 11.5–15.5)
RDW: 17.5 % — ABNORMAL HIGH (ref 11.5–15.5)
WBC: 17.5 10*3/uL — ABNORMAL HIGH (ref 4.0–10.5)
WBC: 19.7 10*3/uL — ABNORMAL HIGH (ref 4.0–10.5)
nRBC: 1.4 % — ABNORMAL HIGH (ref 0.0–0.2)
nRBC: 1.3 % — ABNORMAL HIGH (ref 0.0–0.2)

## 2018-09-07 LAB — HEMOGLOBIN AND HEMATOCRIT, BLOOD
HCT: 21.1 % — ABNORMAL LOW (ref 36.0–46.0)
Hemoglobin: 6.8 g/dL — CL (ref 12.0–15.0)

## 2018-09-07 LAB — BASIC METABOLIC PANEL
Anion gap: 9 (ref 5–15)
BUN: 28 mg/dL — ABNORMAL HIGH (ref 6–20)
CO2: 17 mmol/L — ABNORMAL LOW (ref 22–32)
Calcium: 7.3 mg/dL — ABNORMAL LOW (ref 8.9–10.3)
Chloride: 104 mmol/L (ref 98–111)
Creatinine, Ser: 0.74 mg/dL (ref 0.44–1.00)
GFR calc Af Amer: >60 mL/min (ref >60)
GFR calc non Af Amer: >60 mL/min (ref >60)
Glucose, Bld: 91 mg/dL (ref 70–99)
Potassium: 3.7 mmol/L (ref 3.5–5.1)
Sodium: 130 mmol/L — ABNORMAL LOW (ref 135–145)

## 2018-09-07 LAB — GLUCOSE, CAPILLARY: Glucose-Capillary: 74 mg/dL (ref 70–99)

## 2018-09-07 MED ORDER — SODIUM CHLORIDE 0.9% IV SOLUTION: Freq: Once | INTRAVENOUS | Status: DC

## 2018-09-07 NOTE — Progress Notes (Signed)
CRITICAL VALUE ALERT  Critical Value:  Hg 6.8  Date & Time Notied:  09/07/18 1550  Provider Notified: Dr. Maylene Roes  Orders Received/Actions taken: Order for CBC received.

## 2018-09-07 NOTE — Progress Notes (Signed)
Unable to obtain IV access, nurse and MD aware.

## 2018-09-07 NOTE — Progress Notes (Signed)
INFECTIOUS DISEASE PROGRESS NOTE  ID: Krystal Short is a 33 y.o. female with  Principal Problem:   Sepsis due to pneumonia ([place]) Active Problems:   Hyponatremia   AIDS (acquired immune deficiency syndrome) (HCC)   Symptomatic anemia   Thrombocytopenia (HCC)   Anemia  Subjective: Eating peanut butter-jelly crakers No complaints, no SOB  Abtx:  Anti-infectives (From admission, onward)   Start     Dose/Rate Route Frequency Ordered Stop   09/10/18 0800  azithromycin (ZITHROMAX) tablet 1,200 mg     1,200 mg Oral Weekly 09/04/18 1517     09/04/18 1600  atovaquone (MEPRON) 750 MG/5ML suspension 1,500 mg     1,500 mg Oral Daily with breakfast 09/04/18 1517     09/04/18 1000  bictegravir-emtricitabine-tenofovir AF (BIKTARVY) 50-200-25 MG per tablet 1 tablet     1 tablet Oral Daily 09/03/18 2122     09/04/18 1000  primaquine tablet 30 mg  Status:  Discontinued     30 mg Oral Daily 09/03/18 2220 09/03/18 2222   09/04/18 0200  ceFEPIme (MAXIPIME) 2 g in sodium chloride 0.9 % 100 mL IVPB  Status:  Discontinued     2 g 200 mL/hr over 30 Minutes Intravenous Every 8 hours 09/03/18 2010 09/04/18 1429   09/03/18 2200  azithromycin (ZITHROMAX) 500 mg in sodium chloride 0.9 % 250 mL IVPB  Status:  Discontinued     500 mg 250 mL/hr over 60 Minutes Intravenous Daily at bedtime 09/03/18 2122 09/04/18 1429   09/03/18 2200  clindamycin (CLEOCIN) IVPB 900 mg  Status:  Discontinued     900 mg 100 mL/hr over 30 Minutes Intravenous Every 8 hours 09/03/18 2122 09/04/18 1051   09/03/18 2130  primaquine tablet 30 mg  Status:  Discontinued     30 mg Oral Daily 09/03/18 2122 09/04/18 1350   09/03/18 1500  vancomycin (VANCOCIN) 1,250 mg in sodium chloride 0.9 % 250 mL IVPB     1,250 mg 166.7 mL/hr over 90 Minutes Intravenous  Once 09/03/18 1452 09/04/18 0714   09/03/18 1445  ceFEPIme (MAXIPIME) 2 g in sodium chloride 0.9 % 100 mL IVPB     2 g 200 mL/hr over 30 Minutes Intravenous  Once 09/03/18 1444  09/03/18 1922   09/03/18 1445  metroNIDAZOLE (FLAGYL) IVPB 500 mg     500 mg 100 mL/hr over 60 Minutes Intravenous  Once 09/03/18 1444 09/03/18 1922   09/03/18 1445  vancomycin (VANCOCIN) IVPB 1000 mg/200 mL premix  Status:  Discontinued     1,000 mg 200 mL/hr over 60 Minutes Intravenous  Once 09/03/18 1444 09/03/18 1452      Medications:  Scheduled:  sodium chloride   Intravenous Once   atovaquone  1,500 mg Oral Q breakfast   [START ON 09/10/2018] azithromycin  1,200 mg Oral Weekly   B-complex with vitamin C  1 tablet Oral Daily   bictegravir-emtricitabine-tenofovir AF  1 tablet Oral Daily   Chlorhexidine Gluconate Cloth  6 each Topical Daily   folic acid  2 mg Oral Daily   metoprolol tartrate  25 mg Oral BID   predniSONE  15 mg Oral Q breakfast   sodium chloride flush  10-40 mL Intracatheter Q12H   sodium chloride flush  3 mL Intravenous Q12H   sodium chloride flush  3 mL Intravenous Q12H    Objective: Vital signs in last 24 hours: Temp:  [97.9 F (36.6 C)-98.7 F (37.1 C)] 98.1 F (36.7 C) (08/01 1600) Pulse Rate:  [97-129] 104 (  08/01 1600) Resp:  [22-38] 29 (08/01 1600) BP: (94-147)/(45-79) 121/71 (08/01 1600) SpO2:  [93 %-98 %] 98 % (08/01 1600)   General appearance: alert, cooperative and no distress Resp: rhonchi bilaterally Cardio: regular rate and rhythm GI: normal findings: bowel sounds normal and soft, non-tender  Lab Results Recent Labs    09/05/18 0640  09/06/18 1749 09/07/18 0127 09/07/18 1529  WBC 20.3*   < > 24.2* 17.5*  --   HGB 8.8*   < > 8.3* 8.0* 6.8*  HCT 27.5*   < > 26.3* 25.0* 21.1*  NA 131*  --   --  130*  --   K 4.2  --   --  3.7  --   CL 106  --   --  104  --   CO2 16*  --   --  17*  --   BUN 23*  --   --  28*  --   CREATININE 0.93  --   --  0.74  --    < > = values in this interval not displayed.   Liver Panel No results for input(s): PROT, ALBUMIN, AST, ALT, ALKPHOS, BILITOT, BILIDIR, IBILI in the last 72  hours. Sedimentation Rate No results for input(s): ESRSEDRATE in the last 72 hours. C-Reactive Protein No results for input(s): CRP in the last 72 hours.  Microbiology: Recent Results (from the past 240 hour(s))  Urine culture     Status: None   Collection Time: 09/03/18  4:27 PM   Specimen: In/Out Cath Urine  Result Value Ref Range Status   Specimen Description   Final    IN/OUT CATH URINE Performed at [facility], [place] [address], [place], [place] [address]    Special Requests   Final    NONE Performed at [facility], [place] [address] [place], [place] [address]    Culture   Final    NO GROWTH Performed at [facility], [place] [address], [place], [place] [address]    Report Status 09/04/2018 FINAL  Final  SARS Coronavirus 2 (CEPHEID- Performed in [place] hospital lab), Hosp Order     Status: None   Collection Time: 09/03/18  4:30 PM   Specimen: Nasopharyngeal Swab  Result Value Ref Range Status   SARS Coronavirus 2 NEGATIVE NEGATIVE Final    Comment: (NOTE) If result is NEGATIVE SARS-CoV-2 target nucleic acids are NOT DETECTED. The SARS-CoV-2 RNA is generally detectable in upper and lower  respiratory specimens during the acute phase of infection. The lowest  concentration of SARS-CoV-2 viral copies this assay can detect is 250  copies / mL. A negative result does not preclude SARS-CoV-2 infection  and should not be used as the sole basis for treatment or other  patient management decisions.  A negative result may occur with  improper specimen collection / handling, submission of specimen other  than nasopharyngeal swab, presence of viral mutation(s) within the  areas targeted by this assay, and inadequate number of viral copies  (<250 copies / mL). A negative result must be combined with clinical  observations, patient history, and epidemiological information. If result is POSITIVE SARS-CoV-2 target nucleic acids are  DETECTED. The SARS-CoV-2 RNA is generally detectable in upper and lower  respiratory specimens dur ing the acute phase of infection.  Positive  results are indicative of active infection with SARS-CoV-2.  Clinical  correlation with patient history and other diagnostic information is  necessary to determine patient infection status.  Positive results do  not rule out bacterial infection or co-infection with other viruses. If result is PRESUMPTIVE POSTIVE SARS-CoV-2 nucleic acids MAY BE PRESENT.   A presumptive positive result was obtained on the submitted specimen  and confirmed on repeat testing.  While 2019 novel coronavirus  (SARS-CoV-2) nucleic acids may be present in the submitted sample  additional confirmatory testing may be necessary for epidemiological  and / or clinical management purposes  to differentiate between  SARS-CoV-2 and other Sarbecovirus currently known to infect humans.  If clinically indicated additional testing with an alternate test  methodology [phone]) is advised. The SARS-CoV-2 RNA is generally  detectable in upper and lower respiratory sp ecimens during the acute  phase of infection. The expected result is Negative. Fact Sheet for Patients:  StrictlyIdeas.no Fact Sheet for Healthcare Providers: BankingDealers.co.za This test is not yet approved or cleared by the [place] FDA and has been authorized for detection and/or diagnosis of SARS-CoV-2 by FDA under an Emergency Use Authorization (EUA).  This EUA will remain in effect (meaning this test can be used) for the duration of the COVID-19 declaration under Section 564(b)(1) of the Act, 21 U.S.C. section 360bbb-3(b)(1), unless the authorization is terminated or revoked sooner. Performed at [facility], [place] [address] [place], [place] [address]   Blood Culture (routine x 2)     Status: None (Preliminary result)   Collection Time:  09/03/18  5:58 PM   Specimen: BLOOD  Result Value Ref Range Status   Specimen Description   Final    BLOOD LEFT ANTECUBITAL Performed at [place] [address] [place], [place] [address]    Special Requests   Final    BOTTLES DRAWN AEROBIC ONLY Blood Culture adequate volume Performed at [place] [address] [place], [place] [address]    Culture   Final    NO GROWTH 4 DAYS Performed at [facility], [place] [address], [place], [place] [address]    Report Status PENDING  Incomplete  Blood Culture (routine x 2)     Status: None (Preliminary result)   Collection Time: 09/03/18  5:58 PM   Specimen: BLOOD  Result Value Ref Range Status   Specimen Description   Final    BLOOD RIGHT ANTECUBITAL Performed at [place] [address], [place], [place] [address]    Special Requests   Final    BOTTLES DRAWN AEROBIC ONLY Blood Culture adequate volume Performed at [place] [address] [place], [place] [address]    Culture   Final    NO GROWTH 4 DAYS Performed at [facility], [place] [address], [place], [place] [address]    Report Status PENDING  Incomplete  MRSA PCR Screening     Status: None   Collection Time: 09/03/18 10:14 PM   Specimen: Nasopharyngeal  Result Value Ref Range Status   MRSA by PCR NEGATIVE NEGATIVE Final    Comment:        The GeneXpert MRSA Assay (FDA approved for NASAL specimens only), is one component of a comprehensive MRSA colonization surveillance program. It is not intended to diagnose MRSA infection nor to guide or monitor treatment for MRSA infections. Performed at [facility], [place] [address] [place], [place] [address]   Respiratory Panel by PCR     Status: None   Collection Time: 09/03/18 10:14 PM   Specimen: Nasopharyngeal Swab; Respiratory  Result Value  Ref Range Status   Adenovirus NOT DETECTED NOT DETECTED Final    Coronavirus 229E NOT DETECTED NOT DETECTED Final    Comment: (NOTE) The Coronavirus on the Respiratory Panel, DOES NOT test for the novel  Coronavirus (2019 nCoV)    Coronavirus HKU1 NOT DETECTED NOT DETECTED Final   Coronavirus NL63 NOT DETECTED NOT DETECTED Final   Coronavirus OC43 NOT DETECTED NOT DETECTED Final   Metapneumovirus NOT DETECTED NOT DETECTED Final   Rhinovirus / Enterovirus NOT DETECTED NOT DETECTED Final   Influenza A NOT DETECTED NOT DETECTED Final   Influenza B NOT DETECTED NOT DETECTED Final   Parainfluenza Virus 1 NOT DETECTED NOT DETECTED Final   Parainfluenza Virus 2 NOT DETECTED NOT DETECTED Final   Parainfluenza Virus 3 NOT DETECTED NOT DETECTED Final   Parainfluenza Virus 4 NOT DETECTED NOT DETECTED Final   Respiratory Syncytial Virus NOT DETECTED NOT DETECTED Final   Bordetella pertussis NOT DETECTED NOT DETECTED Final   Chlamydophila pneumoniae NOT DETECTED NOT DETECTED Final   Mycoplasma pneumoniae NOT DETECTED NOT DETECTED Final    Comment: Performed at [facility], [place] [address] [place], [place] [address]    Studies/Results: No results found.   Assessment/Plan: AIDS Pneumonia Anemia  Total days of antibiotics: 0 Mepron daily Azithromycin q week biktarvy daily Prednisone 15 mg daily  She feels well (denies SOB, off O2).  Steroids being weaned for possible lung bx.  Repeat CMV pending.  Incomplete immmune reconstitution.  Available as needed on 8-2  HIV 1 RNA Quant (copies/mL)  Date Value  08/15/2018 <20  07/06/2018 <20  06/20/2018 <20 DETECTED (A)   CD4 T Cell Abs (/uL)  Date Value  06/20/2018 <35 (L)  04/02/2018 10 (L)  12/17/2017 50 (L)           Bobby Rumpf MD, FACP Infectious Diseases (pager) [phone] [email or web address] 09/07/2018, 4:59 PM  LOS: 4 days

## 2018-09-07 NOTE — Progress Notes (Signed)
This nurse able to find iv access, IVIG given per MD order.  MD notified.

## 2018-09-07 NOTE — Progress Notes (Signed)
PROGRESS NOTE    Krystal Short  OVF:[id number] DOB: 1985/04/26 DOA: 09/03/2018 PCP: Lajean Manes, MD     Brief Narrative:  Krystal Short is a 33 y.o. female with medical history significant for AIDS and recent recurrent admissions for pneumonia, now returning to the emergency department for evaluation of generalized weakness, fatigue, low blood pressure, and elevated heart rate.  Patient was admitted to the hospital last month and discharged on 08/22/2018 after treatment of recurrent atypical pneumonia complicated by pancytopenia, acute kidney injury, and hyponatremia.  During the recent admission, she was treated for suspected pneumocystis pneumonia, transfused 7 units of platelets and 8 units of RBC, and reports that she was feeling much better when she returned home.  She began to develop generalized weakness and fatigue approximately 5 days ago, progressively worsening.  Also admits to some intermittent dry cough, shortness of breath with exertion.  In the emergency department, chest x-ray revealed fine reticulonodular interstitial opacity bilaterally, similar to most recent chest radiograph, likely of infectious etiology. Suspect multifocal atypical organism pneumonia.  Hemoglobin 5.3, platelets 16,000.  Patient was given 2 units packed red blood cell as well as 1 unit platelet.  She was started on empiric vancomycin, cefepime, clindamycin.  Infectious disease, oncology, pulmonology consulted.  New events last 24 hours / Subjective: Patient states that she feels better than she did yesterday.  No worsening shortness of breath.  No fevers overnight.  Assessment & Plan:   Principal Problem:   Sepsis due to pneumonia [facility]) Active Problems:   Hyponatremia   AIDS (acquired immune deficiency syndrome) (HCC)   Symptomatic anemia   Thrombocytopenia (HCC)   Anemia   Generalized weakness, deconditioning likely in setting of anemia, thrombocytopenia   -Hgb is 5.3 and platelets 16k on admission  without bleeding  -Underwent extensive inpatient workup during recent admission with unclear etiology; she received 8 units RBC and platelet transfusion x7 during that admission  -Status post 2 unit packed red blood cell, 5 pack platelet so far this admission -Appreciate oncology.  Patient follows with Dr. Irene Limbo -Prednisone -slowly wean -Transfuse 1 pack platelets today.  Trend CBC  -Completed IVIG x2  -Possible evaluation for repeat bone marrow biopsy next week  Atypical pneumonia -Recent hospitalization and discharged on 08/22/2018: At that time autoimmune, vasculitis panel were unremarkable, fungal work-up negative, bronchoscopy on 6/5 revealed scant lung parenchyma with fibrosis and reactive changes.  There was concern for PCP during that admission and patient was started on primaquine, clindamycin which completed while inpatient.  ID at that point recommended open lung biopsy if stable from thrombocytopenia standpoint.  Deferred antiviral treatment for CMV as this was felt to be consistent with commensal infection rather than primary/causal role, Valcyte can also be myelosuppressive -Respiratory viral panel, COVID-19 negative -Strep pneumo antigen negative -MRSA PCR negative -Blood cultures negative growth to date -Legionella antigen negative -QuantiFERON-TB gold indeterminate -Appreciate infectious disease, pulmonology -Antibiotics stopped at this point -Pulmonology planning to follow-up outpatient for any progression of pulmonary infiltrates but for right now, no pulmonary intervention planned  HIV/AIDS  -CD4 41 on 07/12/18 and VL undetectable on 08/15/18 -Continue Biktarvy   -Mepron, Zithromax for prophylaxis  Hyponatremia  -Stable       DVT prophylaxis: SCD Code Status: Full code Family Communication: None Disposition Plan: Pending further improvement, possible repeat bone marrow biopsy next week   Consultants:   Infectious disease  Oncology  Pulmonology    Procedures:   None  Antimicrobials:  Anti-infectives (From admission, onward)  Start     Dose/Rate Route Frequency Ordered Stop   09/10/18 0800  azithromycin (ZITHROMAX) tablet 1,200 mg     1,200 mg Oral Weekly 09/04/18 1517     09/04/18 1600  atovaquone (MEPRON) 750 MG/5ML suspension 1,500 mg     1,500 mg Oral Daily with breakfast 09/04/18 1517     09/04/18 1000  bictegravir-emtricitabine-tenofovir AF (BIKTARVY) 50-200-25 MG per tablet 1 tablet     1 tablet Oral Daily 09/03/18 2122     09/04/18 1000  primaquine tablet 30 mg  Status:  Discontinued     30 mg Oral Daily 09/03/18 2220 09/03/18 2222   09/04/18 0200  ceFEPIme (MAXIPIME) 2 g in sodium chloride 0.9 % 100 mL IVPB  Status:  Discontinued     2 g 200 mL/hr over 30 Minutes Intravenous Every 8 hours 09/03/18 2010 09/04/18 1429   09/03/18 2200  azithromycin (ZITHROMAX) 500 mg in sodium chloride 0.9 % 250 mL IVPB  Status:  Discontinued     500 mg 250 mL/hr over 60 Minutes Intravenous Daily at bedtime 09/03/18 2122 09/04/18 1429   09/03/18 2200  clindamycin (CLEOCIN) IVPB 900 mg  Status:  Discontinued     900 mg 100 mL/hr over 30 Minutes Intravenous Every 8 hours 09/03/18 2122 09/04/18 1051   09/03/18 2130  primaquine tablet 30 mg  Status:  Discontinued     30 mg Oral Daily 09/03/18 2122 09/04/18 1350   09/03/18 1500  vancomycin (VANCOCIN) 1,250 mg in sodium chloride 0.9 % 250 mL IVPB     1,250 mg 166.7 mL/hr over 90 Minutes Intravenous  Once 09/03/18 1452 09/04/18 0714   09/03/18 1445  ceFEPIme (MAXIPIME) 2 g in sodium chloride 0.9 % 100 mL IVPB     2 g 200 mL/hr over 30 Minutes Intravenous  Once 09/03/18 1444 09/03/18 1922   09/03/18 1445  metroNIDAZOLE (FLAGYL) IVPB 500 mg     500 mg 100 mL/hr over 60 Minutes Intravenous  Once 09/03/18 1444 09/03/18 1922   09/03/18 1445  vancomycin (VANCOCIN) IVPB 1000 mg/200 mL premix  Status:  Discontinued     1,000 mg 200 mL/hr over 60 Minutes Intravenous  Once 09/03/18 1444 09/03/18  1452       Objective: Vitals:   09/07/18 0930 09/07/18 1000 09/07/18 1030 09/07/18 1105  BP: 130/75 129/68 120/67 119/70  Pulse:  (!) 127 (!) 123 (!) 106  Resp: (!) 32 (!) 34 (!) 28 (!) 38  Temp:   98.7 F (37.1 C) 98.1 F (36.7 C)  TempSrc:   Oral Oral  SpO2:  96% 97% 97%  Weight:      Height:        Intake/Output Summary (Last 24 hours) at 09/07/2018 1150 Last data filed at 09/07/2018 1055 Gross per 24 hour  Intake 785 ml  Output 200 ml  Net 585 ml   Filed Weights   09/03/18 1404  Weight: 52.5 kg    Examination: General exam: Appears calm and comfortable  Respiratory system: Clear to auscultation. Respiratory effort normal.  Shallow breaths Cardiovascular system: S1 & S2 heard, tachycardic, regular rhythm. No JVD, murmurs, rubs, gallops or clicks. No pedal edema. Gastrointestinal system: Abdomen is nondistended, soft and nontender. No organomegaly or masses felt. Normal bowel sounds heard. Central nervous system: Alert and oriented. No focal neurological deficits. Extremities: Symmetric 5 x 5 power. Psychiatry: Judgement and insight appear normal. Mood & affect appropriate.   Data Reviewed: I have personally reviewed following labs and imaging  studies  CBC: Recent Labs  Lab 09/03/18 1627 09/04/18 0630 09/05/18 0640 09/05/18 1320 09/06/18 0601 09/06/18 1749 09/07/18 0127  WBC 18.0* 25.0* 20.3* 22.4* 14.9* 24.2* 17.5*  NEUTROABS 13.2* 20.1*  --  16.7*  --  18.9*  --   HGB 5.3* 9.3* 8.8* 8.9* 8.1* 8.3* 8.0*  HCT 16.6* 28.5* 27.5* 27.1* 24.1* 26.3* 25.0*  MCV 91.7 91.3 89.9 89.1 88.0 92.6 91.9  PLT 16* 20* 12* 27* 12* 22* 15*   Basic Metabolic Panel: Recent Labs  Lab 09/03/18 1627 09/04/18 0630 09/05/18 0640 09/07/18 0127  NA 127* 132* 131* 130*  K 4.7 4.6 4.2 3.7  CL 100 107 106 104  CO2 17* 16* 16* 17*  GLUCOSE 133* 140* 103* 91  BUN 17 18 23* 28*  CREATININE 0.89 0.88 0.93 0.74  CALCIUM 7.1* 7.0* 7.6* 7.3*   GFR: Estimated Creatinine  Clearance: 75.1 mL/min (by C-G formula based on SCr of 0.74 mg/dL). Liver Function Tests: Recent Labs  Lab 09/03/18 1627 09/04/18 0630  AST 23 22  ALT 14 13  ALKPHOS 143* 142*  BILITOT 1.7* 2.1*  PROT 6.0* 6.2*  ALBUMIN 1.7* 1.7*   No results for input(s): LIPASE, AMYLASE in the last 168 hours. No results for input(s): AMMONIA in the last 168 hours. Coagulation Profile: Recent Labs  Lab 09/03/18 1758  INR 1.4*   Cardiac Enzymes: No results for input(s): CKTOTAL, CKMB, CKMBINDEX, TROPONINI in the last 168 hours. BNP (last 3 results) No results for input(s): PROBNP in the last 8760 hours. HbA1C: No results for input(s): HGBA1C in the last 72 hours. CBG: Recent Labs  Lab 09/06/18 0749 09/06/18 0813 09/06/18 0835 09/06/18 0927 09/07/18 0756  GLUCAP 46* 56* 58* 190* 74   Lipid Profile: No results for input(s): CHOL, HDL, LDLCALC, TRIG, CHOLHDL, LDLDIRECT in the last 72 hours. Thyroid Function Tests: No results for input(s): TSH, T4TOTAL, FREET4, T3FREE, THYROIDAB in the last 72 hours. Anemia Panel: Recent Labs    09/05/18 0640  VITAMINB12 633  FOLATE 17.8  FERRITIN 4,590*  TIBC 146*  IRON 15*  RETICCTPCT 0.7   Sepsis Labs: Recent Labs  Lab 09/03/18 1758  LATICACIDVEN 1.4    Recent Results (from the past 240 hour(s))  Urine culture     Status: None   Collection Time: 09/03/18  4:27 PM   Specimen: In/Out Cath Urine  Result Value Ref Range Status   Specimen Description   Final    IN/OUT CATH URINE Performed at [facility], [place] [address], [place], [place] [address]    Special Requests   Final    NONE Performed at [facility], [place] [address], [place], [place] [address]    Culture   Final    NO GROWTH Performed at [facility], [place] [address], [place], [place] [address]    Report Status 09/04/2018 FINAL  Final  SARS Coronavirus 2 (CEPHEID- Performed in [place] hospital lab), Hosp Order     Status:  None   Collection Time: 09/03/18  4:30 PM   Specimen: Nasopharyngeal Swab  Result Value Ref Range Status   SARS Coronavirus 2 NEGATIVE NEGATIVE Final    Comment: (NOTE) If result is NEGATIVE SARS-CoV-2 target nucleic acids are NOT DETECTED. The SARS-CoV-2 RNA is generally detectable in upper and lower  respiratory specimens during the acute phase of infection. The lowest  concentration of SARS-CoV-2 viral copies this assay can detect is 250  copies / mL. A negative result  does not preclude SARS-CoV-2 infection  and should not be used as the sole basis for treatment or other  patient management decisions.  A negative result may occur with  improper specimen collection / handling, submission of specimen other  than nasopharyngeal swab, presence of viral mutation(s) within the  areas targeted by this assay, and inadequate number of viral copies  (<250 copies / mL). A negative result must be combined with clinical  observations, patient history, and epidemiological information. If result is POSITIVE SARS-CoV-2 target nucleic acids are DETECTED. The SARS-CoV-2 RNA is generally detectable in upper and lower  respiratory specimens dur ing the acute phase of infection.  Positive  results are indicative of active infection with SARS-CoV-2.  Clinical  correlation with patient history and other diagnostic information is  necessary to determine patient infection status.  Positive results do  not rule out bacterial infection or co-infection with other viruses. If result is PRESUMPTIVE POSTIVE SARS-CoV-2 nucleic acids MAY BE PRESENT.   A presumptive positive result was obtained on the submitted specimen  and confirmed on repeat testing.  While 2019 novel coronavirus  (SARS-CoV-2) nucleic acids may be present in the submitted sample  additional confirmatory testing may be necessary for epidemiological  and / or clinical management purposes  to differentiate between  SARS-CoV-2 and other  Sarbecovirus currently known to infect humans.  If clinically indicated additional testing with an alternate test  methodology [phone]) is advised. The SARS-CoV-2 RNA is generally  detectable in upper and lower respiratory sp ecimens during the acute  phase of infection. The expected result is Negative. Fact Sheet for Patients:  StrictlyIdeas.no Fact Sheet for Healthcare Providers: BankingDealers.co.za This test is not yet approved or cleared by the [place] FDA and has been authorized for detection and/or diagnosis of SARS-CoV-2 by FDA under an Emergency Use Authorization (EUA).  This EUA will remain in effect (meaning this test can be used) for the duration of the COVID-19 declaration under Section 564(b)(1) of the Act, 21 U.S.C. section 360bbb-3(b)(1), unless the authorization is terminated or revoked sooner. Performed at [facility], [place] [address], [place], [place] [address]   Blood Culture (routine x 2)     Status: None (Preliminary result)   Collection Time: 09/03/18  5:58 PM   Specimen: BLOOD  Result Value Ref Range Status   Specimen Description   Final    BLOOD LEFT ANTECUBITAL Performed at [place] [address] [place], [place] [address]    Special Requests   Final    BOTTLES DRAWN AEROBIC ONLY Blood Culture adequate volume Performed at [place] [address] [place], [place] [address]    Culture   Final    NO GROWTH 4 DAYS Performed at [facility], [place] [address], [place], [place] [address]    Report Status PENDING  Incomplete  Blood Culture (routine x 2)     Status: None (Preliminary result)   Collection Time: 09/03/18  5:58 PM   Specimen: BLOOD  Result Value Ref Range Status   Specimen Description   Final    BLOOD RIGHT ANTECUBITAL Performed at [place] [address] [place], [place] [address]    Special  Requests   Final    BOTTLES DRAWN AEROBIC ONLY Blood Culture adequate volume Performed at [place] [address] [place], [place] [address]    Culture   Final    NO GROWTH 4 DAYS Performed  at [facility], [place] [address] [place], [place] [address]    Report Status PENDING  Incomplete  MRSA PCR Screening     Status: None   Collection Time: 09/03/18 10:14 PM   Specimen: Nasopharyngeal  Result Value Ref Range Status   MRSA by PCR NEGATIVE NEGATIVE Final    Comment:        The GeneXpert MRSA Assay (FDA approved for NASAL specimens only), is one component of a comprehensive MRSA colonization surveillance program. It is not intended to diagnose MRSA infection nor to guide or monitor treatment for MRSA infections. Performed at [facility], [place] [address], [place], [place] [address]   Respiratory Panel by PCR     Status: None   Collection Time: 09/03/18 10:14 PM   Specimen: Nasopharyngeal Swab; Respiratory  Result Value Ref Range Status   Adenovirus NOT DETECTED NOT DETECTED Final   Coronavirus 229E NOT DETECTED NOT DETECTED Final    Comment: (NOTE) The Coronavirus on the Respiratory Panel, DOES NOT test for the novel  Coronavirus (2019 nCoV)    Coronavirus HKU1 NOT DETECTED NOT DETECTED Final   Coronavirus NL63 NOT DETECTED NOT DETECTED Final   Coronavirus OC43 NOT DETECTED NOT DETECTED Final   Metapneumovirus NOT DETECTED NOT DETECTED Final   Rhinovirus / Enterovirus NOT DETECTED NOT DETECTED Final   Influenza A NOT DETECTED NOT DETECTED Final   Influenza B NOT DETECTED NOT DETECTED Final   Parainfluenza Virus 1 NOT DETECTED NOT DETECTED Final   Parainfluenza Virus 2 NOT DETECTED NOT DETECTED Final   Parainfluenza Virus 3 NOT DETECTED NOT DETECTED Final   Parainfluenza Virus 4 NOT DETECTED NOT DETECTED Final   Respiratory Syncytial Virus NOT DETECTED NOT DETECTED Final   Bordetella pertussis NOT DETECTED NOT DETECTED Final    Chlamydophila pneumoniae NOT DETECTED NOT DETECTED Final   Mycoplasma pneumoniae NOT DETECTED NOT DETECTED Final    Comment: Performed at [facility], [place]. [address] [place], [place] [address]      Radiology Studies: No results found.    Scheduled Meds: . sodium chloride   Intravenous Once  . atovaquone  1,500 mg Oral Q breakfast  . [START ON 09/10/2018] azithromycin  1,200 mg Oral Weekly  . B-complex with vitamin C  1 tablet Oral Daily  . bictegravir-emtricitabine-tenofovir AF  1 tablet Oral Daily  . Chlorhexidine Gluconate Cloth  6 each Topical Daily  . folic acid  2 mg Oral Daily  . metoprolol tartrate  25 mg Oral BID  . predniSONE  15 mg Oral Q breakfast  . sodium chloride flush  10-40 mL Intracatheter Q12H  . sodium chloride flush  3 mL Intravenous Q12H  . sodium chloride flush  3 mL Intravenous Q12H   Continuous Infusions: . sodium chloride Stopped (09/05/18 1507)     LOS: 4 days      Time spent: 20 minutes   Dessa Phi, DO [facility] [email or web address] 09/07/2018, 11:50 AM

## 2018-09-07 NOTE — Progress Notes (Signed)
Iv team unable to find iv access, MD notified.

## 2018-09-07 NOTE — Progress Notes (Signed)
CRITICAL VALUE ALERT  Critical Value:  Platelets 15  Date & Time Notied:  09/07/18 @ 04:22  Provider Notified: Kennon Holter

## 2018-09-08 DIAGNOSIS — D6959 Other secondary thrombocytopenia: Secondary | ICD-10-CM | POA: Diagnosis not present

## 2018-09-08 DIAGNOSIS — D869 Sarcoidosis, unspecified: Secondary | ICD-10-CM | POA: Diagnosis not present

## 2018-09-08 DIAGNOSIS — A419 Sepsis, unspecified organism: Secondary | ICD-10-CM | POA: Diagnosis not present

## 2018-09-08 DIAGNOSIS — Z20828 Contact with and (suspected) exposure to other viral communicable diseases: Secondary | ICD-10-CM | POA: Diagnosis not present

## 2018-09-08 DIAGNOSIS — B20 Human immunodeficiency virus [HIV] disease: Secondary | ICD-10-CM | POA: Diagnosis not present

## 2018-09-08 DIAGNOSIS — J189 Pneumonia, unspecified organism: Secondary | ICD-10-CM | POA: Diagnosis not present

## 2018-09-08 DIAGNOSIS — E43 Unspecified severe protein-calorie malnutrition: Secondary | ICD-10-CM | POA: Diagnosis not present

## 2018-09-08 DIAGNOSIS — R161 Splenomegaly, not elsewhere classified: Secondary | ICD-10-CM | POA: Diagnosis not present

## 2018-09-08 DIAGNOSIS — E871 Hypo-osmolality and hyponatremia: Secondary | ICD-10-CM | POA: Diagnosis not present

## 2018-09-08 LAB — PREPARE PLATELET PHERESIS
Unit division: 0
Unit division: 0
Unit division: 0

## 2018-09-08 LAB — CULTURE, BLOOD (ROUTINE X 2)
Culture: NO GROWTH
Culture: NO GROWTH
Special Requests: ADEQUATE
Special Requests: ADEQUATE

## 2018-09-08 LAB — BPAM PLATELET PHERESIS
Blood Product Expiration Date: 202007312359
Blood Product Expiration Date: 202007312359
Blood Product Expiration Date: 202008032359
ISSUE DATE / TIME: 202007310755
ISSUE DATE / TIME: 202007311111
ISSUE DATE / TIME: 202008011045
Unit Type and Rh: 6200
Unit Type and Rh: 6200
Unit Type and Rh: 5100

## 2018-09-08 LAB — CBC
HCT: 20.9 % — ABNORMAL LOW (ref 36.0–46.0)
Hemoglobin: 6.9 g/dL — CL (ref 12.0–15.0)
MCH: 29 pg (ref 26.0–34.0)
MCHC: 33 g/dL (ref 30.0–36.0)
MCV: 87.8 fL (ref 80.0–100.0)
Platelets: 8 10*3/uL — CL (ref 150–400)
RBC: 2.38 MIL/uL — ABNORMAL LOW (ref 3.87–5.11)
RDW: 17.4 % — ABNORMAL HIGH (ref 11.5–15.5)
WBC: 17.1 10*3/uL — ABNORMAL HIGH (ref 4.0–10.5)
nRBC: 1.1 % — ABNORMAL HIGH (ref 0.0–0.2)

## 2018-09-08 LAB — GLUCOSE, CAPILLARY: Glucose-Capillary: 72 mg/dL (ref 70–99)

## 2018-09-08 LAB — PREPARE RBC (CROSSMATCH)

## 2018-09-08 MED ORDER — SODIUM CHLORIDE 0.9% IV SOLUTION
Freq: Once | INTRAVENOUS | Status: AC
  Administered 2018-09-08: 05:00:00 via INTRAVENOUS

## 2018-09-08 NOTE — Progress Notes (Signed)
PROGRESS NOTE    Krystal Short  ERD:[id number] DOB: 06-Feb-1986 DOA: 09/03/2018 PCP: Lajean Manes, MD     Brief Narrative:  Krystal Short is a 32 y.o. female with medical history significant for AIDS and recent recurrent admissions for pneumonia, now returning to the emergency department for evaluation of generalized weakness, fatigue, low blood pressure, and elevated heart rate.  Patient was admitted to the hospital last month and discharged on 08/22/2018 after treatment of recurrent atypical pneumonia complicated by pancytopenia, acute kidney injury, and hyponatremia.  During the recent admission, she was treated for suspected pneumocystis pneumonia, transfused 7 units of platelets and 8 units of RBC, and reports that she was feeling much better when she returned home.  She began to develop generalized weakness and fatigue approximately 5 days ago, progressively worsening.  Also admits to some intermittent dry cough, shortness of breath with exertion.  In the emergency department, chest x-ray revealed fine reticulonodular interstitial opacity bilaterally, similar to most recent chest radiograph, likely of infectious etiology. Suspect multifocal atypical organism pneumonia.  Hemoglobin 5.3, platelets 16,000.  Patient was given 2 units packed red blood cell as well as 1 unit platelet.  She was started on empiric vancomycin, cefepime, clindamycin.  Infectious disease, oncology, pulmonology consulted.  New events last 24 hours / Subjective: Feeling about the same, no change in symptoms. Off oxygen and breathing is stable.   Assessment & Plan:   Principal Problem:   Sepsis due to pneumonia [facility]) Active Problems:   Hyponatremia   AIDS (acquired immune deficiency syndrome) (HCC)   Symptomatic anemia   Thrombocytopenia (HCC)   Anemia   Generalized weakness, deconditioning likely in setting of anemia, thrombocytopenia   -Hgb is 5.3 and platelets 16k on admission without bleeding  -Underwent  extensive inpatient workup during recent admission with unclear etiology; she received 8 units RBC and platelet transfusion x7 during that admission  -Status post 2 unit packed red blood cell, 6 pack platelet so far this admission -Appreciate oncology.  Patient follows with Dr. Irene Limbo -Prednisone -slowly wean -Transfuse 1 pack platelets, 1 unit pRBC today.  Trend CBC  -Completed IVIG x2  -Possible evaluation for repeat bone marrow biopsy next week  Atypical pneumonia -Recent hospitalization and discharged on 08/22/2018: At that time autoimmune, vasculitis panel were unremarkable, fungal work-up negative, bronchoscopy on 6/5 revealed scant lung parenchyma with fibrosis and reactive changes.  There was concern for PCP during that admission and patient was started on primaquine, clindamycin which completed while inpatient.  ID at that point recommended open lung biopsy if stable from thrombocytopenia standpoint.  Deferred antiviral treatment for CMV as this was felt to be consistent with commensal infection rather than primary/causal role, Valcyte can also be myelosuppressive -Respiratory viral panel, COVID-19 negative -Strep pneumo antigen negative -MRSA PCR negative -Blood cultures negative growth to date -Legionella antigen negative -QuantiFERON-TB gold indeterminate -Appreciate infectious disease, pulmonology -Antibiotics stopped at this point -Pulmonology planning to follow-up outpatient for any progression of pulmonary infiltrates but for right now, no pulmonary intervention planned  HIV/AIDS  -CD4 41 on 07/12/18 and VL undetectable on 08/15/18 -Continue Biktarvy   -Mepron, Zithromax for prophylaxis  Hyponatremia  -Stable       DVT prophylaxis: SCD Code Status: Full code Family Communication: None Disposition Plan: Pending further improvement, possible repeat bone marrow biopsy next week   Consultants:   Infectious disease  Oncology  Pulmonology   Procedures:   None   Antimicrobials:  Anti-infectives (From admission, onward)   Start  Dose/Rate Route Frequency Ordered Stop   09/10/18 0800  azithromycin (ZITHROMAX) tablet 1,200 mg     1,200 mg Oral Weekly 09/04/18 1517     09/04/18 1600  atovaquone (MEPRON) 750 MG/5ML suspension 1,500 mg     1,500 mg Oral Daily with breakfast 09/04/18 1517     09/04/18 1000  bictegravir-emtricitabine-tenofovir AF (BIKTARVY) 50-200-25 MG per tablet 1 tablet     1 tablet Oral Daily 09/03/18 2122     09/04/18 1000  primaquine tablet 30 mg  Status:  Discontinued     30 mg Oral Daily 09/03/18 2220 09/03/18 2222   09/04/18 0200  ceFEPIme (MAXIPIME) 2 g in sodium chloride 0.9 % 100 mL IVPB  Status:  Discontinued     2 g 200 mL/hr over 30 Minutes Intravenous Every 8 hours 09/03/18 2010 09/04/18 1429   09/03/18 2200  azithromycin (ZITHROMAX) 500 mg in sodium chloride 0.9 % 250 mL IVPB  Status:  Discontinued     500 mg 250 mL/hr over 60 Minutes Intravenous Daily at bedtime 09/03/18 2122 09/04/18 1429   09/03/18 2200  clindamycin (CLEOCIN) IVPB 900 mg  Status:  Discontinued     900 mg 100 mL/hr over 30 Minutes Intravenous Every 8 hours 09/03/18 2122 09/04/18 1051   09/03/18 2130  primaquine tablet 30 mg  Status:  Discontinued     30 mg Oral Daily 09/03/18 2122 09/04/18 1350   09/03/18 1500  vancomycin (VANCOCIN) 1,250 mg in sodium chloride 0.9 % 250 mL IVPB     1,250 mg 166.7 mL/hr over 90 Minutes Intravenous  Once 09/03/18 1452 09/04/18 0714   09/03/18 1445  ceFEPIme (MAXIPIME) 2 g in sodium chloride 0.9 % 100 mL IVPB     2 g 200 mL/hr over 30 Minutes Intravenous  Once 09/03/18 1444 09/03/18 1922   09/03/18 1445  metroNIDAZOLE (FLAGYL) IVPB 500 mg     500 mg 100 mL/hr over 60 Minutes Intravenous  Once 09/03/18 1444 09/03/18 1922   09/03/18 1445  vancomycin (VANCOCIN) IVPB 1000 mg/200 mL premix  Status:  Discontinued     1,000 mg 200 mL/hr over 60 Minutes Intravenous  Once 09/03/18 1444 09/03/18 1452       Objective:  Vitals:   09/08/18 0600 09/08/18 0700 09/08/18 0800 09/08/18 0821  BP: 125/80 (!) 143/85 134/77 135/80  Pulse: (!) 118 (!) 125 (!) 136 (!) 138  Resp: (!) 34 (!) 37 (!) 33 (!) 37  Temp:    98.2 F (36.8 C)  TempSrc:    Oral  SpO2: 92% 92% 91% 92%  Weight:      Height:        Intake/Output Summary (Last 24 hours) at 09/08/2018 0847 Last data filed at 09/08/2018 0841 Gross per 24 hour  Intake 1093.42 ml  Output 1300 ml  Net -206.58 ml   Filed Weights   09/03/18 1404  Weight: 52.5 kg    Examination: General exam: Appears calm and comfortable  Respiratory system: Clear to auscultation. Respiratory effort normal. Cardiovascular system: S1 & S2 heard, tachycardic, regular rhythm. No JVD, murmurs, rubs, gallops or clicks. No pedal edema. Gastrointestinal system: Abdomen is nondistended, soft and nontender. No organomegaly or masses felt. Normal bowel sounds heard. Central nervous system: Alert and oriented. No focal neurological deficits. Extremities: Symmetric 5 x 5 power. Skin: No rashes, lesions or ulcers, +molluscum  Psychiatry: Judgement and insight appear normal. Mood & affect appropriate.    Data Reviewed: I have personally reviewed following labs and imaging  studies  CBC: Recent Labs  Lab 09/03/18 1627 09/04/18 0630  09/05/18 1320 09/06/18 0601 09/06/18 1749 09/07/18 0127 09/07/18 1529 09/07/18 1622 09/08/18 0303  WBC 18.0* 25.0*   < > 22.4* 14.9* 24.2* 17.5*  --  19.7* 17.1*  NEUTROABS 13.2* 20.1*  --  16.7*  --  18.9*  --   --   --   --   HGB 5.3* 9.3*   < > 8.9* 8.1* 8.3* 8.0* 6.8* 7.4* 6.9*  HCT 16.6* 28.5*   < > 27.1* 24.1* 26.3* 25.0* 21.1* 23.4* 20.9*  MCV 91.7 91.3   < > 89.1 88.0 92.6 91.9  --  91.1 87.8  PLT 16* 20*   < > 27* 12* 22* 15*  --  18* 8*   < > = values in this interval not displayed.   Basic Metabolic Panel: Recent Labs  Lab 09/03/18 1627 09/04/18 0630 09/05/18 0640 09/07/18 0127  NA 127* 132* 131* 130*  K 4.7 4.6 4.2 3.7  CL 100  107 106 104  CO2 17* 16* 16* 17*  GLUCOSE 133* 140* 103* 91  BUN 17 18 23* 28*  CREATININE 0.89 0.88 0.93 0.74  CALCIUM 7.1* 7.0* 7.6* 7.3*   GFR: Estimated Creatinine Clearance: 75.1 mL/min (by C-G formula based on SCr of 0.74 mg/dL). Liver Function Tests: Recent Labs  Lab 09/03/18 1627 09/04/18 0630  AST 23 22  ALT 14 13  ALKPHOS 143* 142*  BILITOT 1.7* 2.1*  PROT 6.0* 6.2*  ALBUMIN 1.7* 1.7*   No results for input(s): LIPASE, AMYLASE in the last 168 hours. No results for input(s): AMMONIA in the last 168 hours. Coagulation Profile: Recent Labs  Lab 09/03/18 1758  INR 1.4*   Cardiac Enzymes: No results for input(s): CKTOTAL, CKMB, CKMBINDEX, TROPONINI in the last 168 hours. BNP (last 3 results) No results for input(s): PROBNP in the last 8760 hours. HbA1C: No results for input(s): HGBA1C in the last 72 hours. CBG: Recent Labs  Lab 09/06/18 0813 09/06/18 0835 09/06/18 0927 09/07/18 0756 09/08/18 0750  GLUCAP 56* 58* 190* 74 72   Lipid Profile: No results for input(s): CHOL, HDL, LDLCALC, TRIG, CHOLHDL, LDLDIRECT in the last 72 hours. Thyroid Function Tests: No results for input(s): TSH, T4TOTAL, FREET4, T3FREE, THYROIDAB in the last 72 hours. Anemia Panel: No results for input(s): VITAMINB12, FOLATE, FERRITIN, TIBC, IRON, RETICCTPCT in the last 72 hours. Sepsis Labs: Recent Labs  Lab 09/03/18 1758  LATICACIDVEN 1.4    Recent Results (from the past 240 hour(s))  Urine culture     Status: None   Collection Time: 09/03/18  4:27 PM   Specimen: In/Out Cath Urine  Result Value Ref Range Status   Specimen Description   Final    IN/OUT CATH URINE Performed at [facility], [place] [address] [place], [place] [address]    Special Requests   Final    NONE Performed at [facility], [place] [address] [place], [place] [address]    Culture   Final    NO GROWTH Performed at [facility], [place] [address],  [place], [place] [address]    Report Status 09/04/2018 FINAL  Final  SARS Coronavirus 2 (CEPHEID- Performed in [place] hospital lab), Hosp Order     Status: None   Collection Time: 09/03/18  4:30 PM   Specimen: Nasopharyngeal Swab  Result Value Ref Range Status   SARS Coronavirus 2 NEGATIVE NEGATIVE Final    Comment: (NOTE) If result  is NEGATIVE SARS-CoV-2 target nucleic acids are NOT DETECTED. The SARS-CoV-2 RNA is generally detectable in upper and lower  respiratory specimens during the acute phase of infection. The lowest  concentration of SARS-CoV-2 viral copies this assay can detect is 250  copies / mL. A negative result does not preclude SARS-CoV-2 infection  and should not be used as the sole basis for treatment or other  patient management decisions.  A negative result may occur with  improper specimen collection / handling, submission of specimen other  than nasopharyngeal swab, presence of viral mutation(s) within the  areas targeted by this assay, and inadequate number of viral copies  (<250 copies / mL). A negative result must be combined with clinical  observations, patient history, and epidemiological information. If result is POSITIVE SARS-CoV-2 target nucleic acids are DETECTED. The SARS-CoV-2 RNA is generally detectable in upper and lower  respiratory specimens dur ing the acute phase of infection.  Positive  results are indicative of active infection with SARS-CoV-2.  Clinical  correlation with patient history and other diagnostic information is  necessary to determine patient infection status.  Positive results do  not rule out bacterial infection or co-infection with other viruses. If result is PRESUMPTIVE POSTIVE SARS-CoV-2 nucleic acids MAY BE PRESENT.   A presumptive positive result was obtained on the submitted specimen  and confirmed on repeat testing.  While 2019 novel coronavirus  (SARS-CoV-2) nucleic acids may be present in the submitted sample   additional confirmatory testing may be necessary for epidemiological  and / or clinical management purposes  to differentiate between  SARS-CoV-2 and other Sarbecovirus currently known to infect humans.  If clinically indicated additional testing with an alternate test  methodology [phone]) is advised. The SARS-CoV-2 RNA is generally  detectable in upper and lower respiratory sp ecimens during the acute  phase of infection. The expected result is Negative. Fact Sheet for Patients:  StrictlyIdeas.no Fact Sheet for Healthcare Providers: BankingDealers.co.za This test is not yet approved or cleared by the [place] FDA and has been authorized for detection and/or diagnosis of SARS-CoV-2 by FDA under an Emergency Use Authorization (EUA).  This EUA will remain in effect (meaning this test can be used) for the duration of the COVID-19 declaration under Section 564(b)(1) of the Act, 21 U.S.C. section 360bbb-3(b)(1), unless the authorization is terminated or revoked sooner. Performed at [facility], [place] [address] [place], [place] [address]   Blood Culture (routine x 2)     Status: None   Collection Time: 09/03/18  5:58 PM   Specimen: BLOOD  Result Value Ref Range Status   Specimen Description   Final    BLOOD LEFT ANTECUBITAL Performed at [facility] [address] [place], [place] [address]    Special Requests   Final    BOTTLES DRAWN AEROBIC ONLY Blood Culture adequate volume Performed at [place] [address] [place], [place] [address]    Culture   Final    NO GROWTH 5 DAYS Performed at [facility], [place] [address], [place], [place] [address]    Report Status 09/08/2018 FINAL  Final  Blood Culture (routine x 2)     Status: None   Collection Time: 09/03/18  5:58 PM   Specimen: BLOOD  Result Value Ref Range Status   Specimen Description   Final    BLOOD  RIGHT ANTECUBITAL Performed at [place] [address] [place], [place] [address]  Special Requests   Final    BOTTLES DRAWN AEROBIC ONLY Blood Culture adequate volume Performed at [place] [address] [place], [place] [address]    Culture   Final    NO GROWTH 5 DAYS Performed at [facility], [place] [address], [place], [place] [address]    Report Status 09/08/2018 FINAL  Final  MRSA PCR Screening     Status: None   Collection Time: 09/03/18 10:14 PM   Specimen: Nasopharyngeal  Result Value Ref Range Status   MRSA by PCR NEGATIVE NEGATIVE Final    Comment:        The GeneXpert MRSA Assay (FDA approved for NASAL specimens only), is one component of a comprehensive MRSA colonization surveillance program. It is not intended to diagnose MRSA infection nor to guide or monitor treatment for MRSA infections. Performed at [facility], [place] [address] [place], [place] [address]   Respiratory Panel by PCR     Status: None   Collection Time: 09/03/18 10:14 PM   Specimen: Nasopharyngeal Swab; Respiratory  Result Value Ref Range Status   Adenovirus NOT DETECTED NOT DETECTED Final   Coronavirus 229E NOT DETECTED NOT DETECTED Final    Comment: (NOTE) The Coronavirus on the Respiratory Panel, DOES NOT test for the novel  Coronavirus (2019 nCoV)    Coronavirus HKU1 NOT DETECTED NOT DETECTED Final   Coronavirus NL63 NOT DETECTED NOT DETECTED Final   Coronavirus OC43 NOT DETECTED NOT DETECTED Final   Metapneumovirus NOT DETECTED NOT DETECTED Final   Rhinovirus / Enterovirus NOT DETECTED NOT DETECTED Final   Influenza A NOT DETECTED NOT DETECTED Final   Influenza B NOT DETECTED NOT DETECTED Final   Parainfluenza Virus 1 NOT DETECTED NOT DETECTED Final   Parainfluenza Virus 2 NOT DETECTED NOT DETECTED Final   Parainfluenza Virus 3 NOT DETECTED NOT DETECTED Final   Parainfluenza Virus 4 NOT DETECTED NOT DETECTED  Final   Respiratory Syncytial Virus NOT DETECTED NOT DETECTED Final   Bordetella pertussis NOT DETECTED NOT DETECTED Final   Chlamydophila pneumoniae NOT DETECTED NOT DETECTED Final   Mycoplasma pneumoniae NOT DETECTED NOT DETECTED Final    Comment: Performed at [facility], [place]. [address] [place], [place] [address]  Culture, blood (routine x 2)     Status: None (Preliminary result)   Collection Time: 09/07/18  7:52 AM   Specimen: BLOOD RIGHT HAND  Result Value Ref Range Status   Specimen Description   Final    BLOOD RIGHT HAND Performed at [facility] [address] [place], [place] [address]    Special Requests   Final    BOTTLES DRAWN AEROBIC ONLY Blood Culture adequate volume Performed at [facility] [address] [place], [place] [address]    Culture   Final    NO GROWTH < 24 HOURS Performed at [place] [address] [place], [place] [address]    Report Status PENDING  Incomplete  Culture, blood (routine x 2)     Status: None (Preliminary result)   Collection Time: 09/07/18  7:52 AM   Specimen: BLOOD  Result Value Ref Range Status   Specimen Description   Final    BLOOD RIGHT ANTECUBITAL Performed at [facility] [address], [place], [place] [address]    Special Requests   Final    BOTTLES DRAWN AEROBIC ONLY Blood Culture results may not be optimal due to an  inadequate volume of blood received in culture bottles Performed at [facility] [address], [place], [place] [address]    Culture   Final    NO GROWTH < 24 HOURS Performed at [facility] [address] [place], [place] [address]    Report Status PENDING  Incomplete      Radiology Studies: No results found.    Scheduled Meds: . sodium chloride   Intravenous Once  . atovaquone  1,500 mg Oral Q breakfast  . [START ON 09/10/2018] azithromycin  1,200 mg Oral Weekly  . B-complex with vitamin  C  1 tablet Oral Daily  . bictegravir-emtricitabine-tenofovir AF  1 tablet Oral Daily  . Chlorhexidine Gluconate Cloth  6 each Topical Daily  . folic acid  2 mg Oral Daily  . metoprolol tartrate  25 mg Oral BID  . predniSONE  15 mg Oral Q breakfast  . sodium chloride flush  10-40 mL Intracatheter Q12H  . sodium chloride flush  3 mL Intravenous Q12H  . sodium chloride flush  3 mL Intravenous Q12H   Continuous Infusions: . sodium chloride Stopped (09/05/18 1507)     LOS: 5 days      Time spent: 20 minutes   Dessa Phi, DO [facility] [email or web address] 09/08/2018, 8:47 AM

## 2018-09-08 NOTE — Progress Notes (Signed)
CRITICAL VALUE ALERT  Critical Value: HB 6.9   PLT 8  Date & Time Notied:  09/08/2018 0400  Provider Notified: Kennon Holter NP  Orders Received/Actions taken: yes

## 2018-09-08 NOTE — Progress Notes (Signed)
Consult was placed to the IV Team for a 2nd iv site,  due to multiple infusions for blood, platelets, meds;  Attempted x 2 in the left arm using ultrasound, but without success;  Pt was also seen by IV Team Friday night, but attempts were also unsuccessful;  Pt currently has one PIV; site wnl.

## 2018-09-08 NOTE — Progress Notes (Signed)
Patients HR sustaining in the 150's with an elevated temperature of 102.8.  PO tylenol and Metoprolol given.  Dr. Maylene Roes updated on patient change in condition.  RN directed to hold off on blood administration for now.  RN will continue to monitor.Marland Kitchen

## 2018-09-09 LAB — CBC
HCT: 28.4 % — ABNORMAL LOW (ref 36.0–46.0)
Hemoglobin: 9.5 g/dL — ABNORMAL LOW (ref 12.0–15.0)
MCH: 29.3 pg (ref 26.0–34.0)
MCHC: 33.5 g/dL (ref 30.0–36.0)
MCV: 87.7 fL (ref 80.0–100.0)
Platelets: 12 10*3/uL — CL (ref 150–400)
RBC: 3.24 MIL/uL — ABNORMAL LOW (ref 3.87–5.11)
RDW: 16.9 % — ABNORMAL HIGH (ref 11.5–15.5)
WBC: 15.1 10*3/uL — ABNORMAL HIGH (ref 4.0–10.5)
nRBC: 3.1 % — ABNORMAL HIGH (ref 0.0–0.2)

## 2018-09-09 LAB — CBC WITH DIFFERENTIAL/PLATELET
Abs Immature Granulocytes: 0.65 10*3/uL — ABNORMAL HIGH (ref 0.00–0.07)
Abs Immature Granulocytes: 0.58 10*3/uL — ABNORMAL HIGH (ref 0.00–0.07)
Basophils Absolute: 0.1 10*3/uL (ref 0.0–0.1)
Basophils Absolute: 0.1 10*3/uL (ref 0.0–0.1)
Basophils Relative: 0 %
Basophils Relative: 1 %
Eosinophils Absolute: 0.1 10*3/uL (ref 0.0–0.5)
Eosinophils Absolute: 0 10*3/uL (ref 0.0–0.5)
Eosinophils Relative: 0 %
Eosinophils Relative: 0 %
HCT: 25.1 % — ABNORMAL LOW (ref 36.0–46.0)
HCT: 32.2 % — ABNORMAL LOW (ref 36.0–46.0)
Hemoglobin: 8.4 g/dL — ABNORMAL LOW (ref 12.0–15.0)
Hemoglobin: 10.3 g/dL — ABNORMAL LOW (ref 12.0–15.0)
Immature Granulocytes: 3 %
Immature Granulocytes: 4 %
Lymphocytes Relative: 6 %
Lymphocytes Relative: 10 %
Lymphs Abs: 1.3 10*3/uL (ref 0.7–4.0)
Lymphs Abs: 1.6 10*3/uL (ref 0.7–4.0)
MCH: 28.6 pg (ref 26.0–34.0)
MCH: 29.1 pg (ref 26.0–34.0)
MCHC: 33.5 g/dL (ref 30.0–36.0)
MCHC: 32 g/dL (ref 30.0–36.0)
MCV: 85.4 fL (ref 80.0–100.0)
MCV: 91 fL (ref 80.0–100.0)
Monocytes Absolute: 0.9 10*3/uL (ref 0.1–1.0)
Monocytes Absolute: 1 10*3/uL (ref 0.1–1.0)
Monocytes Relative: 4 %
Monocytes Relative: 6 %
Neutro Abs: 19.3 10*3/uL — ABNORMAL HIGH (ref 1.7–7.7)
Neutro Abs: 13 10*3/uL — ABNORMAL HIGH (ref 1.7–7.7)
Neutrophils Relative %: 87 %
Neutrophils Relative %: 79 %
Platelets: 13 10*3/uL — CL (ref 150–400)
Platelets: 32 10*3/uL — ABNORMAL LOW (ref 150–400)
RBC: 2.94 MIL/uL — ABNORMAL LOW (ref 3.87–5.11)
RBC: 3.54 MIL/uL — ABNORMAL LOW (ref 3.87–5.11)
RDW: 16.4 % — ABNORMAL HIGH (ref 11.5–15.5)
RDW: 17.4 % — ABNORMAL HIGH (ref 11.5–15.5)
WBC Morphology: >20
WBC: 22.3 10*3/uL — ABNORMAL HIGH (ref 4.0–10.5)
WBC: 16.2 10*3/uL — ABNORMAL HIGH (ref 4.0–10.5)
nRBC: 0.8 % — ABNORMAL HIGH (ref 0.0–0.2)
nRBC: 2.4 % — ABNORMAL HIGH (ref 0.0–0.2)

## 2018-09-09 LAB — BPAM RBC
Blood Product Expiration Date: 202008292359
ISSUE DATE / TIME: 202008021555
Unit Type and Rh: 5100

## 2018-09-09 LAB — TYPE AND SCREEN
ABO/RH(D): O POS
Antibody Screen: NEGATIVE
Unit division: 0

## 2018-09-09 LAB — PREPARE PLATELET PHERESIS: Unit division: 0

## 2018-09-09 LAB — CD4/CD8 (T-HELPER/T-SUPPRESSOR CELL)
CD4 absolute: 58 /uL — ABNORMAL LOW (ref 400–1790)
CD4%: 4 % — ABNORMAL LOW (ref 33–65)
CD8 T Cell Abs: 601 /uL (ref 190–1000)
CD8tox: 49 % — ABNORMAL HIGH (ref 12–40)
Ratio: 0.1 — ABNORMAL LOW (ref 1.0–3.0)
Total lymphocyte count: 1221 /uL (ref 1000–4000)

## 2018-09-09 LAB — HISTOPLASMA ANTIGEN, URINE: Histoplasma Antigen, urine: <0.5 (ref <0.5)

## 2018-09-09 LAB — BPAM PLATELET PHERESIS
Blood Product Expiration Date: 202008032359
ISSUE DATE / TIME: 202008020457
Unit Type and Rh: 600

## 2018-09-09 LAB — GLUCOSE, CAPILLARY: Glucose-Capillary: 83 mg/dL (ref 70–99)

## 2018-09-09 MED ORDER — SODIUM CHLORIDE 0.9% IV SOLUTION
Freq: Once | INTRAVENOUS | Status: AC
  Administered 2018-09-09: 08:00:00 via INTRAVENOUS

## 2018-09-09 MED ORDER — PREDNISONE 10 MG PO TABS
10.0000 mg | ORAL_TABLET | Freq: Every day | ORAL | Status: DC
  Administered 2018-09-10 – 2018-09-13 (×4): 10 mg via ORAL
  Filled 2018-09-09 (×4): qty 1

## 2018-09-09 MED ORDER — ROMIPLOSTIM 250 MCG ~~LOC~~ SOLR
3.0000 ug/kg | SUBCUTANEOUS | Status: DC
  Administered 2018-09-10: 170 ug via SUBCUTANEOUS
  Filled 2018-09-09: qty 0.34

## 2018-09-09 MED ORDER — ACETAMINOPHEN 325 MG PO TABS
650.0000 mg | ORAL_TABLET | Freq: Once | ORAL | Status: AC
  Administered 2018-09-09: 650 mg via ORAL
  Filled 2018-09-09: qty 2

## 2018-09-09 NOTE — Progress Notes (Signed)
PROGRESS NOTE    Krystal Short  EBR:[id number] DOB: 1985-02-26 DOA: 09/03/2018 PCP: Lajean Manes, MD     Brief Narrative:  Krystal Short is a 33 y.o. female with medical history significant for AIDS and recent recurrent admissions for pneumonia, now returning to the emergency department for evaluation of generalized weakness, fatigue, low blood pressure, and elevated heart rate.  Patient was admitted to the hospital last month and discharged on 08/22/2018 after treatment of recurrent atypical pneumonia complicated by pancytopenia, acute kidney injury, and hyponatremia.  During the recent admission, she was treated for suspected pneumocystis pneumonia, transfused 7 units of platelets and 8 units of RBC, and reports that she was feeling much better when she returned home.  She began to develop generalized weakness and fatigue approximately 5 days ago, progressively worsening.  Also admits to some intermittent dry cough, shortness of breath with exertion.  In the emergency department, chest x-ray revealed fine reticulonodular interstitial opacity bilaterally, similar to most recent chest radiograph, likely of infectious etiology. Suspect multifocal atypical organism pneumonia.  Hemoglobin 5.3, platelets 16,000.  Patient was given 2 units packed red blood cell as well as 1 unit platelet.  She was started on empiric vancomycin, cefepime, clindamycin.  Infectious disease, oncology, pulmonology consulted.  New events last 24 hours / Subjective: No new complaints today, no worsening SOB. Afebrile overnight.   Assessment & Plan:   Principal Problem:   Sepsis due to pneumonia [facility]) Active Problems:   Hyponatremia   AIDS (acquired immune deficiency syndrome) (HCC)   Symptomatic anemia   Thrombocytopenia (HCC)   Anemia   Generalized weakness, deconditioning likely in setting of anemia, thrombocytopenia   -Hgb is 5.3 and platelets 16k on admission without bleeding  -Underwent extensive inpatient  workup during recent admission with unclear etiology; she received 8 units RBC and platelet transfusion x7 during that admission  -Status post 3 unit packed red blood cell, 7 unit platelet so far this admission -Appreciate oncology.  Patient follows with Dr. Irene Limbo -Completed IVIG x2  -Prednisone -slowly wean -Transfuse 1 pack platelets today.  Trend CBC  -Possible evaluation for repeat bone marrow biopsy this week. Awaiting further oncology input   Atypical pneumonia -Recent hospitalization and discharged on 08/22/2018: At that time autoimmune, vasculitis panel were unremarkable, fungal work-up negative, bronchoscopy on 6/5 revealed scant lung parenchyma with fibrosis and reactive changes.  There was concern for PCP during that admission and patient was started on primaquine, clindamycin which completed while inpatient.  ID at that point recommended open lung biopsy if stable from thrombocytopenia standpoint.  Deferred antiviral treatment for CMV as this was felt to be consistent with commensal infection rather than primary/causal role, Valcyte can also be myelosuppressive -Respiratory viral panel, COVID-19 negative -Strep pneumo antigen negative -MRSA PCR negative -Blood cultures negative growth to date -Legionella antigen negative -QuantiFERON-TB gold indeterminate -Appreciate infectious disease, pulmonology -Antibiotics stopped at this point -Pulmonology planning to follow-up outpatient for any progression of pulmonary infiltrates but for right now, no pulmonary intervention planned  HIV/AIDS  -CD4 41 on 07/12/18 and VL undetectable on 08/15/18 -Continue Biktarvy   -Mepron, Zithromax for prophylaxis  Hyponatremia  -Stable       DVT prophylaxis: SCD Code Status: Full code Family Communication: None Disposition Plan: Pending further improvement, possible repeat bone marrow biopsy this week   Consultants:   Infectious disease  Oncology  Pulmonology   Procedures:   None   Antimicrobials:  Anti-infectives (From admission, onward)   Start  Dose/Rate Route Frequency Ordered Stop   09/10/18 0800  azithromycin (ZITHROMAX) tablet 1,200 mg     1,200 mg Oral Weekly 09/04/18 1517     09/04/18 1600  atovaquone (MEPRON) 750 MG/5ML suspension 1,500 mg     1,500 mg Oral Daily with breakfast 09/04/18 1517     09/04/18 1000  bictegravir-emtricitabine-tenofovir AF (BIKTARVY) 50-200-25 MG per tablet 1 tablet     1 tablet Oral Daily 09/03/18 2122     09/04/18 1000  primaquine tablet 30 mg  Status:  Discontinued     30 mg Oral Daily 09/03/18 2220 09/03/18 2222   09/04/18 0200  ceFEPIme (MAXIPIME) 2 g in sodium chloride 0.9 % 100 mL IVPB  Status:  Discontinued     2 g 200 mL/hr over 30 Minutes Intravenous Every 8 hours 09/03/18 2010 09/04/18 1429   09/03/18 2200  azithromycin (ZITHROMAX) 500 mg in sodium chloride 0.9 % 250 mL IVPB  Status:  Discontinued     500 mg 250 mL/hr over 60 Minutes Intravenous Daily at bedtime 09/03/18 2122 09/04/18 1429   09/03/18 2200  clindamycin (CLEOCIN) IVPB 900 mg  Status:  Discontinued     900 mg 100 mL/hr over 30 Minutes Intravenous Every 8 hours 09/03/18 2122 09/04/18 1051   09/03/18 2130  primaquine tablet 30 mg  Status:  Discontinued     30 mg Oral Daily 09/03/18 2122 09/04/18 1350   09/03/18 1500  vancomycin (VANCOCIN) 1,250 mg in sodium chloride 0.9 % 250 mL IVPB     1,250 mg 166.7 mL/hr over 90 Minutes Intravenous  Once 09/03/18 1452 09/04/18 0714   09/03/18 1445  ceFEPIme (MAXIPIME) 2 g in sodium chloride 0.9 % 100 mL IVPB     2 g 200 mL/hr over 30 Minutes Intravenous  Once 09/03/18 1444 09/03/18 1922   09/03/18 1445  metroNIDAZOLE (FLAGYL) IVPB 500 mg     500 mg 100 mL/hr over 60 Minutes Intravenous  Once 09/03/18 1444 09/03/18 1922   09/03/18 1445  vancomycin (VANCOCIN) IVPB 1000 mg/200 mL premix  Status:  Discontinued     1,000 mg 200 mL/hr over 60 Minutes Intravenous  Once 09/03/18 1444 09/03/18 1452       Objective:  Vitals:   09/09/18 0400 09/09/18 0500 09/09/18 0730 09/09/18 0745  BP: 125/80 116/77    Pulse: (!) 104 (!) 117 (!) 126   Resp: (!) 31 (!) 38 (!) 28   Temp: 97.8 F (36.6 C)   98.7 F (37.1 C)  TempSrc: Oral   Oral  SpO2: 97% 100% 100%   Weight:  56.7 kg    Height:        Intake/Output Summary (Last 24 hours) at 09/09/2018 0850 Last data filed at 09/09/2018 0235 Gross per 24 hour  Intake 315 ml  Output 600 ml  Net -285 ml   Filed Weights   09/03/18 1404 09/09/18 0500  Weight: 52.5 kg 56.7 kg    Examination: General exam: Appears calm and comfortable  Respiratory system: Diminished breath sounds, no respiratory distress Cardiovascular system: S1 & S2 heard, tachycardic, regular rhythm. No JVD, murmurs, rubs, gallops or clicks. No pedal edema. Gastrointestinal system: Abdomen is nondistended, soft and nontender. No organomegaly or masses felt. Normal bowel sounds heard. Central nervous system: Alert and oriented. No focal neurological deficits. Extremities: Symmetric  Skin: No rashes, lesions or ulcers on exposed skin. +molluscum  Psychiatry: Judgement and insight appear normal. Mood & affect appropriate.    Data Reviewed: I have personally  reviewed following labs and imaging studies  CBC: Recent Labs  Lab 09/03/18 1627 09/04/18 0630  09/05/18 1320  09/06/18 1749 09/07/18 0127 09/07/18 1529 09/07/18 1622 09/08/18 0303 09/08/18 2319 09/09/18 0658  WBC 18.0* 25.0*   < > 22.4*   < > 24.2* 17.5*  --  19.7* 17.1* 22.3* 15.1*  NEUTROABS 13.2* 20.1*  --  16.7*  --  18.9*  --   --   --   --  19.3*  --   HGB 5.3* 9.3*   < > 8.9*   < > 8.3* 8.0* 6.8* 7.4* 6.9* 8.4* 9.5*  HCT 16.6* 28.5*   < > 27.1*   < > 26.3* 25.0* 21.1* 23.4* 20.9* 25.1* 28.4*  MCV 91.7 91.3   < > 89.1   < > 92.6 91.9  --  91.1 87.8 85.4 87.7  PLT 16* 20*   < > 27*   < > 22* 15*  --  18* 8* 13* 12*   < > = values in this interval not displayed.   Basic Metabolic Panel: Recent Labs  Lab 09/03/18 1627  09/04/18 0630 09/05/18 0640 09/07/18 0127  NA 127* 132* 131* 130*  K 4.7 4.6 4.2 3.7  CL 100 107 106 104  CO2 17* 16* 16* 17*  GLUCOSE 133* 140* 103* 91  BUN 17 18 23* 28*  CREATININE 0.89 0.88 0.93 0.74  CALCIUM 7.1* 7.0* 7.6* 7.3*   GFR: Estimated Creatinine Clearance: 81.6 mL/min (by C-G formula based on SCr of 0.74 mg/dL). Liver Function Tests: Recent Labs  Lab 09/03/18 1627 09/04/18 0630  AST 23 22  ALT 14 13  ALKPHOS 143* 142*  BILITOT 1.7* 2.1*  PROT 6.0* 6.2*  ALBUMIN 1.7* 1.7*   No results for input(s): LIPASE, AMYLASE in the last 168 hours. No results for input(s): AMMONIA in the last 168 hours. Coagulation Profile: Recent Labs  Lab 09/03/18 1758  INR 1.4*   Cardiac Enzymes: No results for input(s): CKTOTAL, CKMB, CKMBINDEX, TROPONINI in the last 168 hours. BNP (last 3 results) No results for input(s): PROBNP in the last 8760 hours. HbA1C: No results for input(s): HGBA1C in the last 72 hours. CBG: Recent Labs  Lab 09/06/18 0813 09/06/18 0835 09/06/18 0927 09/07/18 0756 09/08/18 0750  GLUCAP 56* 58* 190* 74 72   Lipid Profile: No results for input(s): CHOL, HDL, LDLCALC, TRIG, CHOLHDL, LDLDIRECT in the last 72 hours. Thyroid Function Tests: No results for input(s): TSH, T4TOTAL, FREET4, T3FREE, THYROIDAB in the last 72 hours. Anemia Panel: No results for input(s): VITAMINB12, FOLATE, FERRITIN, TIBC, IRON, RETICCTPCT in the last 72 hours. Sepsis Labs: Recent Labs  Lab 09/03/18 1758  LATICACIDVEN 1.4    Recent Results (from the past 240 hour(s))  Urine culture     Status: None   Collection Time: 09/03/18  4:27 PM   Specimen: In/Out Cath Urine  Result Value Ref Range Status   Specimen Description   Final    IN/OUT CATH URINE Performed at [facility], [place] [address], [place], [place] [address]    Special Requests   Final    NONE Performed at [facility], [place] [address], [place], [place] [address]     Culture   Final    NO GROWTH Performed at [facility], [place] [address] [place], [place] [address]    Report Status 09/04/2018 FINAL  Final  SARS Coronavirus 2 (CEPHEID- Performed in [facility] hospital lab), [facility]  Status: None   Collection Time: 09/03/18  4:30 PM   Specimen: Nasopharyngeal Swab  Result Value Ref Range Status   SARS Coronavirus 2 NEGATIVE NEGATIVE Final    Comment: (NOTE) If result is NEGATIVE SARS-CoV-2 target nucleic acids are NOT DETECTED. The SARS-CoV-2 RNA is generally detectable in upper and lower  respiratory specimens during the acute phase of infection. The lowest  concentration of SARS-CoV-2 viral copies this assay can detect is 250  copies / mL. A negative result does not preclude SARS-CoV-2 infection  and should not be used as the sole basis for treatment or other  patient management decisions.  A negative result may occur with  improper specimen collection / handling, submission of specimen other  than nasopharyngeal swab, presence of viral mutation(s) within the  areas targeted by this assay, and inadequate number of viral copies  (<250 copies / mL). A negative result must be combined with clinical  observations, patient history, and epidemiological information. If result is POSITIVE SARS-CoV-2 target nucleic acids are DETECTED. The SARS-CoV-2 RNA is generally detectable in upper and lower  respiratory specimens dur ing the acute phase of infection.  Positive  results are indicative of active infection with SARS-CoV-2.  Clinical  correlation with patient history and other diagnostic information is  necessary to determine patient infection status.  Positive results do  not rule out bacterial infection or co-infection with other viruses. If result is PRESUMPTIVE POSTIVE SARS-CoV-2 nucleic acids MAY BE PRESENT.   A presumptive positive result was obtained on the submitted specimen  and confirmed on repeat testing.  While 2019 novel  coronavirus  (SARS-CoV-2) nucleic acids may be present in the submitted sample  additional confirmatory testing may be necessary for epidemiological  and / or clinical management purposes  to differentiate between  SARS-CoV-2 and other Sarbecovirus currently known to infect humans.  If clinically indicated additional testing with an alternate test  methodology [phone]) is advised. The SARS-CoV-2 RNA is generally  detectable in upper and lower respiratory sp ecimens during the acute  phase of infection. The expected result is Negative. Fact Sheet for Patients:  StrictlyIdeas.no Fact Sheet for Healthcare Providers: BankingDealers.co.za This test is not yet approved or cleared by the [place] FDA and has been authorized for detection and/or diagnosis of SARS-CoV-2 by FDA under an Emergency Use Authorization (EUA).  This EUA will remain in effect (meaning this test can be used) for the duration of the COVID-19 declaration under Section 564(b)(1) of the Act, 21 U.S.C. section 360bbb-3(b)(1), unless the authorization is terminated or revoked sooner. Performed at [facility], [place] [address] [place], [place] [address]   Blood Culture (routine x 2)     Status: None   Collection Time: 09/03/18  5:58 PM   Specimen: BLOOD  Result Value Ref Range Status   Specimen Description   Final    BLOOD LEFT ANTECUBITAL Performed at [place] [address] [place], [place] [address]    Special Requests   Final    BOTTLES DRAWN AEROBIC ONLY Blood Culture adequate volume Performed at [place] [address] [place] [address]    Culture   Final    NO GROWTH 5 DAYS Performed at [facility], [place] [address] [place], [place] [address]    Report Status 09/08/2018 FINAL  Final  Blood Culture (routine x 2)     Status: None   Collection Time: 09/03/18  5:58 PM  Specimen: BLOOD  Result Value Ref Range Status   Specimen Description   Final    BLOOD RIGHT ANTECUBITAL Performed at [place] [address] [place], [place] [address]    Special Requests   Final    BOTTLES DRAWN AEROBIC ONLY Blood Culture adequate volume Performed at [place] [address] [place], [place] [address]    Culture   Final    NO GROWTH 5 DAYS Performed at [facility], [place] [address] [place], [place] [address]    Report Status 09/08/2018 FINAL  Final  MRSA PCR Screening     Status: None   Collection Time: 09/03/18 10:14 PM   Specimen: Nasopharyngeal  Result Value Ref Range Status   MRSA by PCR NEGATIVE NEGATIVE Final    Comment:        The GeneXpert MRSA Assay (FDA approved for NASAL specimens only), is one component of a comprehensive MRSA colonization surveillance program. It is not intended to diagnose MRSA infection nor to guide or monitor treatment for MRSA infections. Performed at [facility], [place] [address] [place], [place] [address]   Respiratory Panel by PCR     Status: None   Collection Time: 09/03/18 10:14 PM   Specimen: Nasopharyngeal Swab; Respiratory  Result Value Ref Range Status   Adenovirus NOT DETECTED NOT DETECTED Final   Coronavirus 229E NOT DETECTED NOT DETECTED Final    Comment: (NOTE) The Coronavirus on the Respiratory Panel, DOES NOT test for the novel  Coronavirus (2019 nCoV)    Coronavirus HKU1 NOT DETECTED NOT DETECTED Final   Coronavirus NL63 NOT DETECTED NOT DETECTED Final   Coronavirus OC43 NOT DETECTED NOT DETECTED Final   Metapneumovirus NOT DETECTED NOT DETECTED Final   Rhinovirus / Enterovirus NOT DETECTED NOT DETECTED Final   Influenza A NOT DETECTED NOT DETECTED Final   Influenza B NOT DETECTED NOT DETECTED Final   Parainfluenza Virus 1 NOT DETECTED NOT DETECTED Final   Parainfluenza Virus 2 NOT DETECTED NOT DETECTED Final   Parainfluenza  Virus 3 NOT DETECTED NOT DETECTED Final   Parainfluenza Virus 4 NOT DETECTED NOT DETECTED Final   Respiratory Syncytial Virus NOT DETECTED NOT DETECTED Final   Bordetella pertussis NOT DETECTED NOT DETECTED Final   Chlamydophila pneumoniae NOT DETECTED NOT DETECTED Final   Mycoplasma pneumoniae NOT DETECTED NOT DETECTED Final    Comment: Performed at [facility], [place]. [address], [place], [place] [address]  Culture, blood (routine x 2)     Status: None (Preliminary result)   Collection Time: 09/07/18  7:52 AM   Specimen: BLOOD RIGHT HAND  Result Value Ref Range Status   Specimen Description   Final    BLOOD RIGHT HAND Performed at [place] [address] [place], [place] [address]    Special Requests   Final    BOTTLES DRAWN AEROBIC ONLY Blood Culture adequate volume Performed at [place] [address], [place], [place] [address]    Culture   Final    NO GROWTH < 24 HOURS Performed at [place] [address] [place], [place] [address]    Report Status PENDING  Incomplete  Culture, blood (routine x 2)     Status: None (Preliminary result)   Collection Time: 09/07/18  7:52 AM   Specimen: BLOOD  Result Value Ref Range Status   Specimen Description   Final    BLOOD RIGHT ANTECUBITAL Performed  at [facility], [place] [address] [place], [place] [address]    Special Requests   Final    BOTTLES DRAWN AEROBIC ONLY Blood Culture results may not be optimal due to an inadequate volume of blood received in culture bottles Performed at [place] [address] [place], [place] [address]    Culture   Final    NO GROWTH < 24 HOURS Performed at [place] [address] [place], [place] [address]    Report Status PENDING  Incomplete      Radiology Studies: No results found.    Scheduled Meds: . atovaquone  1,500 mg Oral Q breakfast  . [START ON 09/10/2018]  azithromycin  1,200 mg Oral Weekly  . B-complex with vitamin C  1 tablet Oral Daily  . bictegravir-emtricitabine-tenofovir AF  1 tablet Oral Daily  . Chlorhexidine Gluconate Cloth  6 each Topical Daily  . folic acid  2 mg Oral Daily  . metoprolol tartrate  25 mg Oral BID  . predniSONE  15 mg Oral Q breakfast  . sodium chloride flush  10-40 mL Intracatheter Q12H  . sodium chloride flush  3 mL Intravenous Q12H  . sodium chloride flush  3 mL Intravenous Q12H   Continuous Infusions: . sodium chloride Stopped (09/05/18 1507)     LOS: 6 days      Time spent: 25 minutes   Dessa Phi, DO [facility] [email or web address] 09/09/2018, 8:50 AM

## 2018-09-09 NOTE — Progress Notes (Signed)
Marland Kitchen   HEMATOLOGY/ONCOLOGY INPATIENT PROGRESS NOTE  Date of Service: 09/09/2018  Inpatient Attending: .Dessa Phi, DO   SUBJECTIVE  Patient is seen in f/u for her multfactorial anemia and thrombocytopenia that has required multiple PRBC and PLT transfusions. She notes no fevers today.Notes that her breathing is better but she is stillSOB which minimal walking. S/p ABx for pneumonia. On ART. Discussed consideration of Nplate use which patient is agreeable to and rpt BM Bx for re-evaluation of her cytopenias. Received IVIG on 7/31 and 8/1-- no significant improvement in thrombocytopenia at this time.   OBJECTIVE:  NAD  PHYSICAL EXAMINATION: . Vitals:   09/09/18 1700 09/09/18 1800 09/09/18 1900 09/09/18 2000  BP: (!) 135/107 132/89 124/82 120/75  Pulse: 84 91 98 94  Resp: (!) 27 (!) 26 (!) 27 (!) 25  Temp:    97.6 F (36.4 C)  TempSrc:    Oral  SpO2: 96% 98% 91% 98%  Weight:      Height:       Filed Weights   09/03/18 1404 09/09/18 0500  Weight: 115 lb 12.6 oz (52.5 kg) 125 lb (56.7 kg)   .Body mass index is 24.01 kg/m.  GENERAL:alert, in no acute distress  EYES: normal, conjunctiva are pink and non-injected, sclera clear OROPHARYNX:no exudate tongue normal  NECK: supple, no JVD, thyroid normal size, non-tender, without nodularity LYMPH:  no palpable lymphadenopathy in the cervical, axillary or inguinal LUNGS: clear to auscultation with normal respiratory effort HEART: regular rate & rhythm,  no murmurs and no lower extremity edema ABDOMEN: abdomen soft, non-tender. Musculoskeletal: no cyanosis of digits and no clubbing  PSYCH: alert & oriented x 3 with fluent speech NEURO: no focal motor/sensory deficits  MEDICAL HISTORY:  Past Medical History:  Diagnosis Date  . Acute hyponatremia 06/03/2017  . Anemia   . CAP (community acquired pneumonia) 06/03/2017  . Facial dermatitis 06/03/2017  . HIV (human immunodeficiency virus infection) ([place])   . Pleurisy 06/03/2017   . 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 ([place])   . Thrush of mouth and esophagus ([employer])   . UTI (urinary tract infection)     SURGICAL HISTORY: Past Surgical History:  Procedure Laterality Date  . BIOPSY  07/12/2018   Procedure: BIOPSY;  Surgeon: Laurin Coder, MD;  [facility];  Service: Endoscopy;;  . BRONCHIAL WASHINGS  07/12/2018   Procedure: BRONCHIAL WASHINGS;  Surgeon: Laurin Coder, MD;  [facility];  Service: Endoscopy;;  . ENDOBRONCHIAL ULTRASOUND N/A 07/12/2018   Procedure: ENDOBRONCHIAL ULTRASOUND;  Surgeon: Laurin Coder, MD;  [facility];  Service: Endoscopy;  Laterality: N/A;  . FINE NEEDLE ASPIRATION BIOPSY  07/12/2018   Procedure: FINE NEEDLE ASPIRATION BIOPSY;  Surgeon: Laurin Coder, MD;  [facility];  Service: Endoscopy;;  . NO PAST SURGERIES    . VIDEO BRONCHOSCOPY N/A 07/12/2018   Procedure: VIDEO BRONCHOSCOPY WITHOUT FLUORO;  Surgeon: Laurin Coder, MD;  [facility];  Service: Endoscopy;  Laterality: N/A;    SOCIAL HISTORY: Social History   Socioeconomic History  . Marital status: Married    Spouse name: Not on file  . Number of children: Not on file  . Years of education: college  . Highest education level: Bachelor's degree (e.g., BA, AB, BS)  Occupational History  . Occupation: [employer]    Employer: [employer]  . Financial resource strain: Not hard at all  . Food insecurity    Worry:  Never true    Inability: Never true  . Transportation needs    Medical: No    Non-medical: No  Tobacco Use  . Smoking status: Former Smoker    Packs/day: 0.25    Years: 8.00    Pack years: 2.00    Types: Cigarettes, Cigars    Quit date: 12/25/2016    Years since quitting: 1.7  . Smokeless tobacco: Never Used  Substance and Sexual Activity  . Alcohol use: Yes    Comment: weekend/socially  . Drug use: No  . Sexual activity: Not Currently     Birth control/protection: Injection  Lifestyle  . Physical activity    Days per week: 0 days    Minutes per session: 0 min  . Stress: Not at all  Relationships  . Social Herbalist on phone: Once a week    Gets together: Once a week    Attends religious service: More than 4 times per year    Active member of club or organization: No    Attends meetings of clubs or organizations: Never    Relationship status: Married  . Intimate partner violence    Fear of current or ex partner: No    Emotionally abused: No    Physically abused: No    Forced sexual activity: No  Other Topics Concern  . Not on file  Social History Narrative   Patient does not drive, if her husband cannot provide transportation she takes uses Lyft services   She has not smoked since 2018    FAMILY HISTORY: Family History  Problem Relation Age of Onset  . Healthy Father   . Healthy Mother   . Asthma Paternal Grandmother     ALLERGIES:  is allergic to heparin.  MEDICATIONS:  Scheduled Meds: . atovaquone  1,500 mg Oral Q breakfast  . [START ON 09/10/2018] azithromycin  1,200 mg Oral Weekly  . B-complex with vitamin C  1 tablet Oral Daily  . bictegravir-emtricitabine-tenofovir AF  1 tablet Oral Daily  . Chlorhexidine Gluconate Cloth  6 each Topical Daily  . folic acid  2 mg Oral Daily  . metoprolol tartrate  25 mg Oral BID  . [START ON 09/10/2018] predniSONE  10 mg Oral Q breakfast  . sodium chloride flush  10-40 mL Intracatheter Q12H  . sodium chloride flush  3 mL Intravenous Q12H  . sodium chloride flush  3 mL Intravenous Q12H   Continuous Infusions: . sodium chloride Stopped (09/05/18 1507)   PRN Meds:.sodium chloride, acetaminophen **OR** acetaminophen, guaiFENesin, HYDROcodone-acetaminophen, metoprolol tartrate, polyethylene glycol, sodium chloride flush, sodium chloride flush  REVIEW OF SYSTEMS:    10 Point review of Systems was done is negative except as noted above.   LABORATORY  DATA:  I have reviewed the data as listed  . CBC Latest Ref Rng & Units 09/09/2018 09/09/2018 09/08/2018  WBC 4.0 - 10.5 K/uL 16.2(H) 15.1(H) 22.3(H)  Hemoglobin 12.0 - 15.0 g/dL 10.3(L) 9.5(L) 8.4(L)  Hematocrit 36.0 - 46.0 % 32.2(L) 28.4(L) 25.1(L)  Platelets 150 - 400 K/uL 32(L) 12(LL) 13(LL)    . CMP Latest Ref Rng & Units 09/07/2018 09/05/2018 09/04/2018  Glucose 70 - 99 mg/dL 91 103(H) 140(H)  BUN 6 - 20 mg/dL 28(H) 23(H) 18  Creatinine 0.44 - 1.00 mg/dL 0.74 0.93 0.88  Sodium 135 - 145 mmol/L 130(L) 131(L) 132(L)  Potassium 3.5 - 5.1 mmol/L 3.7 4.2 4.6  Chloride 98 - 111 mmol/L 104 106 107  CO2 22 - 32  mmol/L 17(L) 16(L) 16(L)  Calcium 8.9 - 10.3 mg/dL 7.3(L) 7.6(L) 7.0(L)  Total Protein 6.5 - 8.1 g/dL - - 6.2(L)  Total Bilirubin 0.3 - 1.2 mg/dL - - 2.1(H)  Alkaline Phos 38 - 126 U/L - - 142(H)  AST 15 - 41 U/L - - 22  ALT 0 - 44 U/L - - 13     RADIOGRAPHIC STUDIES: I have personally reviewed the radiological images as listed and agreed with the findings in the report. Dg Chest 2 View  Result Date: 08/12/2018 CLINICAL DATA:  33 year old female with history of AIDS and concern for pneumonia. EXAM: CHEST - 2 VIEW COMPARISON:  Chest CT dated 08/02/2018 and radiograph dated 08/02/2018 FINDINGS: Overall interval improvement of bilateral reticular densities. No focal consolidation, pleural effusion, or pneumothorax. Top-normal cardiac silhouette. No acute osseous pathology. IMPRESSION: Interval improvement of the reticular pulmonary densities since the prior CT and radiograph. No focal consolidation. Electronically Signed   By: Anner Crete M.D.   On: 08/12/2018 14:12   Dg Chest Port 1 View  Result Date: 09/03/2018 CLINICAL DATA:  Cough and fever.  History of HIV disease EXAM: PORTABLE CHEST 1 VIEW COMPARISON:  August 12, 2018 chest radiograph; CT angiogram chest August 02, 2018 FINDINGS: There remains fine reticulonodular interstitial disease throughout the lungs, primarily in the mid and  lower lung zones. The appearance is similar to most recent chest radiograph with less opacity overall compared to most recent CT. No new opacity evident. No consolidation. Heart size and pulmonary vascularity are normal. No adenopathy. No bone lesions. IMPRESSION: Fine reticulonodular interstitial opacity bilaterally, similar to most recent chest radiograph, likely of infectious etiology. Suspect multifocal atypical organism pneumonia. No consolidation. No new opacity. No adenopathy evident. Electronically Signed   By: Lowella Grip III M.D.   On: 09/03/2018 15:39    ASSESSMENT & PLAN:   1. Anemia  2. Thrombocytopenia 3. Leukocytosis 4. PCP recently, recurrent sepsis. Unclear etiology of pulmonary infiltrates. 5. HIV/AIDS on Biktarvy. On Atovaquone for PCP prophylaxis and Azithromycin for MAI prophylaxis  6. Splenomegaly noted on prior abdominal ultrasound PLAN  -Anemia thrombocytopenia are likely multifactorial.  PBS with decreased platelets. No MAHA. Leucocytosis with myeloid left shift and Dohle bodies. Worsening anemia and thrombocytopenia in tandem with increasing leucocytosis with toxic granulations is again concerning for recurrent sepsis with bone marrow suppression vs viral pneumonitis with Bone marrow suppression.(r/o CMV, EBV etc) Baseline anemia of chronic disease from HIV/AIDS. Given increase in Immature platelet fraction cannot r/o HIV related ITP. LDH levels progressively normalizing and does not co-related with anemia and appears to be related likely to lung inflammation and previous elevated LFTs. Her severely depletion nutritional status with severe protein calorie malnutrition from recurrent sepsis is also playing into her cytopenias. (Albumin level of 1.7) Plan Transfusion support for hgb<7.5 and platelets<20k in setting of sepsis. -mx of sepsis per hospitalist/ID -patient received IVIG 1g/kg daily x 2 doses on 7/31 and 8/1-- hoping to have a bump in platelets -may  continue to wean off steroids from hematology standpoint. -still with unclear pulmonary process which pulmonary want to w/u as outpatient -rpt Bone marrow biopsy tomorrow by radiology (patient ok with this). -discussed using Nplate for possible ITP- will start tomorrow @ 3 mcg/kg weekly -will consider rpt Bone marrow biopsy next week if unresolving cytopenias and based on patients goals of care.  Sullivan Lone MD MS  I spent 30 minutes counseling the patient face to face. The total time spent in the appointment was  40 minutes and more than 50% was on counseling and direct patient cares.    Sullivan Lone MD [place] AAHIVMS [facility] Hematology/Oncology Physician [facility]  (Office):       [phone] (Work cell):  [phone] (Fax):           [phone]  09/09/2018 11:15 PM

## 2018-09-09 NOTE — Progress Notes (Signed)
ID PROGRESS NOTE  33yo F with advanced HIV disease, CD 4 count of 58/VL<20, currently on biktarvy recently admitted in July for presumed PCP pneumonia, now readmitted for worsening shortness of breath ongoing thrombocytopenia,  Weaned off of steroids  Afebrile  Hx of BM aspirate at end of May - didn't show signs of infection   HIV disease: continue with biktarvy  oi proph = continue with mepron,  Pneumonia =  Continue on current abtx  Low level CMV disease = not unusual given her immunesuppression.does not require treatment

## 2018-09-10 ENCOUNTER — Other Ambulatory Visit: Payer: Self-pay | Admitting: Hematology

## 2018-09-10 ENCOUNTER — Ambulatory Visit: Payer: 59 | Admitting: Family

## 2018-09-10 LAB — CBC WITH DIFFERENTIAL/PLATELET
Abs Immature Granulocytes: 0.7 10*3/uL — ABNORMAL HIGH (ref 0.00–0.07)
Abs Immature Granulocytes: 0.67 10*3/uL — ABNORMAL HIGH (ref 0.00–0.07)
Basophils Absolute: 0.1 10*3/uL (ref 0.0–0.1)
Basophils Absolute: 0 10*3/uL (ref 0.0–0.1)
Basophils Relative: 0 %
Basophils Relative: 0 %
Eosinophils Absolute: 0 10*3/uL (ref 0.0–0.5)
Eosinophils Absolute: 0 10*3/uL (ref 0.0–0.5)
Eosinophils Relative: 0 %
Eosinophils Relative: 0 %
HCT: 24.6 % — ABNORMAL LOW (ref 36.0–46.0)
HCT: 25.4 % — ABNORMAL LOW (ref 36.0–46.0)
Hemoglobin: 8 g/dL — ABNORMAL LOW (ref 12.0–15.0)
Hemoglobin: 7.9 g/dL — ABNORMAL LOW (ref 12.0–15.0)
Immature Granulocytes: 6 %
Immature Granulocytes: 6 %
Lymphocytes Relative: 14 %
Lymphocytes Relative: 16 %
Lymphs Abs: 1.6 10*3/uL (ref 0.7–4.0)
Lymphs Abs: 1.8 10*3/uL (ref 0.7–4.0)
MCH: 28.5 pg (ref 26.0–34.0)
MCH: 28.4 pg (ref 26.0–34.0)
MCHC: 32.5 g/dL (ref 30.0–36.0)
MCHC: 31.1 g/dL (ref 30.0–36.0)
MCV: 87.5 fL (ref 80.0–100.0)
MCV: 91.4 fL (ref 80.0–100.0)
Monocytes Absolute: 1 10*3/uL (ref 0.1–1.0)
Monocytes Absolute: 1 10*3/uL (ref 0.1–1.0)
Monocytes Relative: 8 %
Monocytes Relative: 9 %
Neutro Abs: 8.6 10*3/uL — ABNORMAL HIGH (ref 1.7–7.7)
Neutro Abs: 7.6 10*3/uL (ref 1.7–7.7)
Neutrophils Relative %: 72 %
Neutrophils Relative %: 69 %
Platelets: 18 10*3/uL — CL (ref 150–400)
Platelets: 27 10*3/uL — CL (ref 150–400)
RBC: 2.81 MIL/uL — ABNORMAL LOW (ref 3.87–5.11)
RBC: 2.78 MIL/uL — ABNORMAL LOW (ref 3.87–5.11)
RDW: 16.6 % — ABNORMAL HIGH (ref 11.5–15.5)
RDW: 16.9 % — ABNORMAL HIGH (ref 11.5–15.5)
WBC: 12 10*3/uL — ABNORMAL HIGH (ref 4.0–10.5)
WBC: 11.1 10*3/uL — ABNORMAL HIGH (ref 4.0–10.5)
nRBC: 3.7 % — ABNORMAL HIGH (ref 0.0–0.2)
nRBC: 2.8 % — ABNORMAL HIGH (ref 0.0–0.2)

## 2018-09-10 LAB — BASIC METABOLIC PANEL
Anion gap: 8 (ref 5–15)
BUN: 21 mg/dL — ABNORMAL HIGH (ref 6–20)
CO2: 21 mmol/L — ABNORMAL LOW (ref 22–32)
Calcium: 7.3 mg/dL — ABNORMAL LOW (ref 8.9–10.3)
Chloride: 105 mmol/L (ref 98–111)
Creatinine, Ser: 0.44 mg/dL (ref 0.44–1.00)
GFR calc Af Amer: >60 mL/min (ref >60)
GFR calc non Af Amer: >60 mL/min (ref >60)
Glucose, Bld: 86 mg/dL (ref 70–99)
Potassium: 4.4 mmol/L (ref 3.5–5.1)
Sodium: 134 mmol/L — ABNORMAL LOW (ref 135–145)

## 2018-09-10 LAB — CBC
HCT: 28.1 % — ABNORMAL LOW (ref 36.0–46.0)
Hemoglobin: 9.4 g/dL — ABNORMAL LOW (ref 12.0–15.0)
MCH: 28.4 pg (ref 26.0–34.0)
MCHC: 33.5 g/dL (ref 30.0–36.0)
MCV: 84.9 fL (ref 80.0–100.0)
Platelets: 13 10*3/uL — CL (ref 150–400)
RBC: 3.31 MIL/uL — ABNORMAL LOW (ref 3.87–5.11)
RDW: 16.7 % — ABNORMAL HIGH (ref 11.5–15.5)
WBC: 13.3 10*3/uL — ABNORMAL HIGH (ref 4.0–10.5)
nRBC: 3.8 % — ABNORMAL HIGH (ref 0.0–0.2)

## 2018-09-10 LAB — BPAM PLATELET PHERESIS
Blood Product Expiration Date: 202008062359
ISSUE DATE / TIME: 202008030900
Unit Type and Rh: 6200

## 2018-09-10 LAB — PREPARE PLATELET PHERESIS: Unit division: 0

## 2018-09-10 LAB — CMV DNA, QUANTITATIVE, PCR
CMV DNA Quant: 2320 IU/mL
Log10 CMV Qn DNA Pl: 3.365 log10 IU/mL

## 2018-09-10 MED ORDER — SODIUM CHLORIDE 0.9% IV SOLUTION: Freq: Once | INTRAVENOUS | Status: DC

## 2018-09-10 MED ORDER — SODIUM CHLORIDE 0.9% IV SOLUTION
Freq: Once | INTRAVENOUS | Status: AC
  Administered 2018-09-10: 13:00:00 via INTRAVENOUS

## 2018-09-10 MED ORDER — ONDANSETRON HCL 4 MG/2ML IJ SOLN
4.0000 mg | Freq: Three times a day (TID) | INTRAMUSCULAR | Status: DC | PRN
  Administered 2018-09-10: 4 mg via INTRAVENOUS
  Filled 2018-09-10: qty 2

## 2018-09-10 NOTE — Progress Notes (Signed)
Referring Physician(s): Kale,G  Supervising Physician: Arne Cleveland  Patient Status:  [facility] - In-pt  Chief Complaint:  Weakness, fatigue, low blood pressure, elevated heart rate  Subjective: Patient familiar to IR service from bone marrow biopsy on 07/05/2018.  She has a history of HIV, splenomegaly and now presenting with worsening anemia and thrombocytopenia in association with increasing leukocytosis.  Request now received from oncology for repeat CT-guided bone marrow biopsy for further evaluation.  She currently denies fever, headache, chest pain, worsening dyspnea, cough, abdominal/back pain, nausea, vomiting or bleeding.  Past Medical History:  Diagnosis Date  . Acute hyponatremia 06/03/2017  . Anemia   . CAP (community acquired pneumonia) 06/03/2017  . Facial dermatitis 06/03/2017  . HIV (human immunodeficiency virus infection) ([place])   . Pleurisy 06/03/2017  . 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 ([place])   . Thrush of mouth and esophagus ([place])   . UTI (urinary tract infection)    Past Surgical History:  Procedure Laterality Date  . BIOPSY  07/12/2018   Procedure: BIOPSY;  Surgeon: Laurin Coder, MD;  [facility];  Service: Endoscopy;;  . BRONCHIAL WASHINGS  07/12/2018   Procedure: BRONCHIAL WASHINGS;  Surgeon: Laurin Coder, MD;  [facility];  Service: Endoscopy;;  . ENDOBRONCHIAL ULTRASOUND N/A 07/12/2018   Procedure: ENDOBRONCHIAL ULTRASOUND;  Surgeon: Laurin Coder, MD;  [facility];  Service: Endoscopy;  Laterality: N/A;  . FINE NEEDLE ASPIRATION BIOPSY  07/12/2018   Procedure: FINE NEEDLE ASPIRATION BIOPSY;  Surgeon: Laurin Coder, MD;  [facility];  Service: Endoscopy;;  . NO PAST SURGERIES    . VIDEO BRONCHOSCOPY N/A 07/12/2018   Procedure: VIDEO BRONCHOSCOPY WITHOUT FLUORO;  Surgeon: Laurin Coder, MD;  [facility];  Service: Endoscopy;  Laterality: N/A;      Allergies:  Heparin  Medications: Prior to Admission medications   Medication Sig Start Date End Date Taking? Authorizing Provider  acetaminophen (TYLENOL) 325 MG tablet Take 650 mg by mouth every 6 (six) hours as needed for mild pain or headache.   Yes [provider]  atovaquone (MEPRON) 750 MG/5ML suspension Take 10 mLs (1,500 mg total) by mouth daily. 08/23/18 09/22/18 Yes Elodia Florence., MD  B Complex-C (B-COMPLEX WITH VITAMIN C) tablet Take 1 tablet by mouth daily. 08/23/18 09/22/18 Yes Elodia Florence., MD  bictegravir-emtricitabine-tenofovir AF (BIKTARVY) 50-200-25 MG TABS tablet Take 1 tablet by mouth daily. 06/20/18  Yes Golden Circle, FNP  folic acid (FOLVITE) 1 MG tablet Take 2 tablets (2 mg total) by mouth daily. 08/23/18 09/22/18 Yes Elodia Florence., MD  ondansetron (ZOFRAN) 4 MG tablet Take 1 tablet (4 mg total) by mouth every 6 (six) hours. 05/24/18  Yes Raylene Everts, MD     Vital Signs: BP 114/84   Pulse 95   Temp 97.7 F (36.5 C) (Oral)   Resp 18   Ht 5' 0.5" (1.537 m)   Wt 125 lb (56.7 kg)   SpO2 95%   BMI 24.01 kg/m   Physical Exam awake, alert.  Chest clear to auscultation bilaterally.  Heart with tachycardic but regular rhythm.  Abdomen soft, positive bowel sounds, nontender.  No lower extremity edema.  Imaging: No results found.  Labs:  CBC: Recent Labs    09/09/18 0658 09/09/18 1926 09/10/18 0238 09/10/18 1056  WBC 15.1* 16.2* 13.3* 12.0*  HGB 9.5* 10.3* 9.4* 8.0*  HCT 28.4* 32.2* 28.1* 24.6*  PLT 12* 32*  13* 18*    COAGS: Recent Labs    07/05/18 0242  07/11/18 1109  08/10/18 0522 08/13/18 0452 08/14/18 0758 09/03/18 1758  INR 1.4*   < > 1.1   < > 1.4* 1.2 1.3* 1.4*  APTT 35  --  36  --   --   --   --  39*   < > = values in this interval not displayed.    BMP: Recent Labs    09/04/18 0630 09/05/18 0640 09/07/18 0127 09/10/18 0238  NA 132* 131* 130* 134*  K 4.6 4.2 3.7 4.4  CL 107 106 104 105  CO2 16*  16* 17* 21*  GLUCOSE 140* 103* 91 86  BUN 18 23* 28* 21*  CALCIUM 7.0* 7.6* 7.3* 7.3*  CREATININE 0.88 0.93 0.74 0.44  GFRNONAA >60 >60 >60 >60  GFRAA >60 >60 >60 >60    LIVER FUNCTION TESTS: Recent Labs    08/22/18 0430 08/29/18 1226 09/03/18 1627 09/04/18 0630  BILITOT 1.6* 1.9* 1.7* 2.1*  AST 55* 45* 23 22  ALT 53* 34 14 13  ALKPHOS 162* 238* 143* 142*  PROT 6.9 7.1 6.0* 6.2*  ALBUMIN 2.6* 2.1* 1.7* 1.7*    Assessment and Plan: Pt with history of HIV, splenomegaly and now presenting with worsening anemia and thrombocytopenia in association with increasing leukocytosis.  Request now received from oncology for  CT-guided bone marrow biopsy for further evaluation.  Risks and benefits of procedure was discussed with the patient and/or patient's family including, but not limited to bleeding, infection, damage to adjacent structures or low yield requiring additional tests.  All of the questions were answered and there is agreement to proceed.  Consent signed and in chart.  Procedure scheduled for 8/5 am.    Electronically Signed: D. Rowe Robert, PA-C 09/10/2018, 5:08 PM   I spent a total of 20 minutes at the the patient's bedside AND on the patient's hospital floor or unit, greater than 50% of which was counseling/coordinating care for CT guided bone marrow biopsy    Patient ID: Krystal Short, female   DOB: November 09, 1985, 33 y.o.   MRN: [id number]

## 2018-09-10 NOTE — Progress Notes (Addendum)
CRITICAL VALUE ALERT  Critical Value:  Platelets 13,000  Date & Time Notied:  09/10/2018 03:27  Provider Notified: Lara Mulch  Orders Received/Actions taken: 1 unit platelets

## 2018-09-10 NOTE — Progress Notes (Signed)
Pt transferred via wheelchair to room 1431 on [address]. Pt orientated to the room and floor policies, pt given hydration and call bell. Pt denies pain at this time with no s/s of distress noted. Will continue to monitor.

## 2018-09-10 NOTE — TOC Initial Note (Signed)
Transition of Care [facility]) - Initial/Assessment Note    Patient Details  Name: Krystal Short MRN: [id number] Date of Birth: 29-Aug-1985  Transition of Care (TOC) CM/SW Contact:    Lynnell Catalan, RN Phone Number: 09/10/2018, 12:25 PM                  Activities of Daily Living Home Assistive Devices/Equipment: None ADL Screening (condition at time of admission) Patient's cognitive ability adequate to safely complete daily activities?: Yes Is the patient deaf or have difficulty hearing?: No Does the patient have difficulty seeing, even when wearing glasses/contacts?: No Does the patient have difficulty concentrating, remembering, or making decisions?: No Patient able to express need for assistance with ADLs?: Yes Does the patient have difficulty dressing or bathing?: Yes Independently performs ADLs?: No Communication: Independent Dressing (OT): Needs assistance Is this a change from baseline?: Pre-admission baseline Grooming: Dependent Is this a change from baseline?: Pre-admission baseline Feeding: Independent Bathing: Needs assistance Is this a change from baseline?: Pre-admission baseline Toileting: Needs assistance Is this a change from baseline?: Pre-admission baseline In/Out Bed: Independent Walks in Home: Independent Does the patient have difficulty walking or climbing stairs?: Yes Weakness of Legs: Both Weakness of Arms/Hands: Both  Permission Sought/Granted                  Emotional Assessment              Admission diagnosis:  Atypical pneumonia [J18.9] Symptomatic HIV infection ([place]) [B20] Anemia, unspecified type [D64.9] Sepsis, due to unspecified organism, unspecified whether acute organ dysfunction present [facility]) [A41.9] Patient Active Problem List   Diagnosis Date Noted  . Anemia   . Sepsis due to pneumonia ([place]) 09/03/2018  . Pancytopenia, acquired ([place])   . Elevated bilirubin   . AKI (acute kidney injury) ([place])   . Acute respiratory  failure with hypoxemia ([place]) 08/03/2018  . Chest pain   . Fever 08/02/2018  . HCAP (healthcare-associated pneumonia) 08/02/2018  . Sepsis ([place]) 08/02/2018  . Hemophagocytosis present in bone marrow ([place])   . Pulmonary infiltrates 07/08/2018  . Hilar adenopathy 07/08/2018  . Acute respiratory failure with hypoxia ([place]) 07/04/2018  . Symptomatic anemia 06/10/2018  . Thrombocytopenia ([place]) 06/10/2018  . ASCUS with positive high risk HPV cervical 06/10/2018  . Suspected pneumocystis pneumonia ([place]) 03/08/2018  . Medication monitoring encounter 12/31/2017  . Molluscum contagiosum 06/27/2017  . AIDS (acquired immune deficiency syndrome) ([place])   . Esophageal candidiasis ([place])   . Hyponatremia 06/03/2017   PCP:  Lajean Manes, MD Pharmacy:   [id number] - [place], [place]. [place] [address] Phone: [phone] Fax: [phone]  [place], [place] - [address] [facility] [place] [address] Phone: [phone] Fax: [place], [place] - [place] [address] Phone: [phone] Fax: [phone]     Social Determinants of Health (SDOH) Interventions    Readmission Risk Interventions Readmission Risk Prevention Plan 09/10/2018 08/08/2018  Transportation Screening Complete Complete  Medication Review (RN Care Manager) Complete Complete  PCP or Specialist appointment within 3-5 days of discharge Not Complete Not Complete  PCP/Specialist Appt Not Complete comments Not close to dc Not ready for DC  HRI or [place] Not Complete Not Complete  HRI or Home Care Consult Pt Refusal Comments NA NA  SW Recovery Care/Counseling Consult Not Complete Not Complete  SW  Consult Not Complete Comments NA NA  Palliative Care Screening Not Complete Not Complete  Comments NA NA  Skilled Nursing Facility  Not Complete Not Complete  SNF Comments NA NA  Some recent data might be hidden

## 2018-09-10 NOTE — Plan of Care (Signed)
  Problem: Education: Goal: Knowledge of General Education information will improve Description: Including pain rating scale, medication(s)/side effects and non-pharmacologic comfort measures Outcome: Progressing   Problem: Clinical Measurements: Goal: Ability to maintain clinical measurements within normal limits will improve Outcome: Progressing Goal: Will remain free from infection Outcome: Progressing Goal: Diagnostic test results will improve Outcome: Progressing Goal: Respiratory complications will improve Outcome: Progressing Goal: Cardiovascular complication will be avoided Outcome: Progressing   Problem: Activity: Goal: Risk for activity intolerance will decrease Outcome: Progressing   Problem: Nutrition: Goal: Adequate nutrition will be maintained Outcome: Progressing   Problem: Pain Managment: Goal: General experience of comfort will improve Outcome: Progressing   Problem: Safety: Goal: Ability to remain free from injury will improve Outcome: Progressing   Problem: Skin Integrity: Goal: Risk for impaired skin integrity will decrease Outcome: Progressing   

## 2018-09-10 NOTE — Progress Notes (Addendum)
PROGRESS NOTE    Krystal Short  DQQ:[id number] DOB: 06-May-1985 DOA: 09/03/2018 PCP: Lajean Manes, MD     Brief Narrative:  Krystal Short is a 33 y.o. female with medical history significant for AIDS and recent recurrent admissions for pneumonia, now returning to the emergency department for evaluation of generalized weakness, fatigue, low blood pressure, and elevated heart rate.  Patient was admitted to the hospital last month and discharged on 08/22/2018 after treatment of recurrent atypical pneumonia complicated by pancytopenia, acute kidney injury, and hyponatremia.  During the recent admission, she was treated for suspected pneumocystis pneumonia, transfused 7 units of platelets and 8 units of RBC, and reports that she was feeling much better when she returned home.  She began to develop generalized weakness and fatigue approximately 5 days ago, progressively worsening.  Also admits to some intermittent dry cough, shortness of breath with exertion.  In the emergency department, chest x-ray revealed fine reticulonodular interstitial opacity bilaterally, similar to most recent chest radiograph, likely of infectious etiology. Suspect multifocal atypical organism pneumonia.  Hemoglobin 5.3, platelets 16,000.  Patient was given 2 units packed red blood cell as well as 1 unit platelet.  She was started on empiric vancomycin, cefepime, clindamycin.  Infectious disease, oncology, pulmonology consulted.  New events last 24 hours / Subjective: Doing about the same, in low spirits because her brother made a surprise visit from [place] not knowing that she was in the hospital. No physical complaints today. Afebrile.   Assessment & Plan:   Principal Problem:   Sepsis due to pneumonia [facility]) Active Problems:   Hyponatremia   AIDS (acquired immune deficiency syndrome) (HCC)   Symptomatic anemia   Thrombocytopenia (HCC)   Anemia   Generalized weakness, deconditioning likely in setting of anemia,  thrombocytopenia   -Hgb is 5.3 and platelets 16k on admission without bleeding  -Underwent extensive inpatient workup during recent admission with unclear etiology; she received 8 units RBC and platelet transfusion x7 during that admission  -Status post 3 unit packed red blood cell, 8 unit platelet so far this admission -Appreciate oncology.  Patient follows with Dr. Irene Limbo -Completed IVIG x2 on 7/31, 8/1  -Prednisone -slowly wean (down to 95m on 8/4) -Transfuse 2 pack platelets today.  Trend CBC  -Starting on Nplate for possible ITP -Bone marrow biopsy pending - Per Dr. PPrince Rome"Would advise that in addition to specimen sent for pathology/cytology that adequate sample also be sent for routine, fungal, AFB, and viral cultures (this typically will require ~10 cc total or more as each culture usually needs ~2cc for sample to adequate)" -Transfuse for Hgb < 7.5, platelet < 20k   Atypical pneumonia -Recent hospitalization and discharged on 08/22/2018: At that time autoimmune, vasculitis panel were unremarkable, fungal work-up negative, bronchoscopy on 6/5 revealed scant lung parenchyma with fibrosis and reactive changes.  There was concern for PCP during that admission and patient was started on primaquine, clindamycin which completed while inpatient.  ID at that point recommended open lung biopsy if stable from thrombocytopenia standpoint.  Deferred antiviral treatment for CMV as this was felt to be consistent with commensal infection rather than primary/causal role, Valcyte can also be myelosuppressive -Respiratory viral panel, COVID-19 negative -Strep pneumo antigen negative -MRSA PCR negative -Blood cultures negative growth to date -Legionella antigen negative -QuantiFERON-TB gold indeterminate -Appreciate infectious disease, pulmonology -Antibiotics stopped at this point -Pulmonology planning to follow-up outpatient for any progression of pulmonary infiltrates but for right now, no pulmonary  intervention planned  HIV/AIDS  -CD4  41 on 07/12/18 and VL undetectable on 08/15/18 -Continue Biktarvy   -Mepron, Zithromax for prophylaxis  Hyponatremia  -Stable, Na 134        DVT prophylaxis: SCD Code Status: Full code Family Communication: None Disposition Plan: Pending further improvement, bone marrow biopsy pending. Transfer to [facility] today.    Consultants:   Infectious disease  Oncology  Pulmonology   Procedures:   None  Antimicrobials:  Anti-infectives (From admission, onward)   Start     Dose/Rate Route Frequency Ordered Stop   09/10/18 0800  azithromycin (ZITHROMAX) tablet 1,200 mg     1,200 mg Oral Weekly 09/04/18 1517     09/04/18 1600  atovaquone (MEPRON) 750 MG/5ML suspension 1,500 mg     1,500 mg Oral Daily with breakfast 09/04/18 1517     09/04/18 1000  bictegravir-emtricitabine-tenofovir AF (BIKTARVY) 50-200-25 MG per tablet 1 tablet     1 tablet Oral Daily 09/03/18 2122     09/04/18 1000  primaquine tablet 30 mg  Status:  Discontinued     30 mg Oral Daily 09/03/18 2220 09/03/18 2222   09/04/18 0200  ceFEPIme (MAXIPIME) 2 g in sodium chloride 0.9 % 100 mL IVPB  Status:  Discontinued     2 g 200 mL/hr over 30 Minutes Intravenous Every 8 hours 09/03/18 2010 09/04/18 1429   09/03/18 2200  azithromycin (ZITHROMAX) 500 mg in sodium chloride 0.9 % 250 mL IVPB  Status:  Discontinued     500 mg 250 mL/hr over 60 Minutes Intravenous Daily at bedtime 09/03/18 2122 09/04/18 1429   09/03/18 2200  clindamycin (CLEOCIN) IVPB 900 mg  Status:  Discontinued     900 mg 100 mL/hr over 30 Minutes Intravenous Every 8 hours 09/03/18 2122 09/04/18 1051   09/03/18 2130  primaquine tablet 30 mg  Status:  Discontinued     30 mg Oral Daily 09/03/18 2122 09/04/18 1350   09/03/18 1500  vancomycin (VANCOCIN) 1,250 mg in sodium chloride 0.9 % 250 mL IVPB     1,250 mg 166.7 mL/hr over 90 Minutes Intravenous  Once 09/03/18 1452 09/04/18 0714   09/03/18 1445  ceFEPIme  (MAXIPIME) 2 g in sodium chloride 0.9 % 100 mL IVPB     2 g 200 mL/hr over 30 Minutes Intravenous  Once 09/03/18 1444 09/03/18 1922   09/03/18 1445  metroNIDAZOLE (FLAGYL) IVPB 500 mg     500 mg 100 mL/hr over 60 Minutes Intravenous  Once 09/03/18 1444 09/03/18 1922   09/03/18 1445  vancomycin (VANCOCIN) IVPB 1000 mg/200 mL premix  Status:  Discontinued     1,000 mg 200 mL/hr over 60 Minutes Intravenous  Once 09/03/18 1444 09/03/18 1452       Objective: Vitals:   09/10/18 0543 09/10/18 0605 09/10/18 0700 09/10/18 0809  BP: 92/63 108/70 121/80 137/83  Pulse: (!) 112 (!) 109 (!) 108 (!) 118  Resp: (!) 24 (!) 26 (!) 33 20  Temp: 97.7 F (36.5 C) 97.7 F (36.5 C)  98.3 F (36.8 C)  TempSrc: Oral Oral  Oral  SpO2: 95% 94% 96% 94%  Weight:      Height:        Intake/Output Summary (Last 24 hours) at 09/10/2018 1017 Last data filed at 09/10/2018 0751 Gross per 24 hour  Intake 442 ml  Output 850 ml  Net -408 ml   Filed Weights   09/03/18 1404 09/09/18 0500  Weight: 52.5 kg 56.7 kg     Examination: General exam: Appears calm and  comfortable  Respiratory system: Clear to auscultation. Respiratory effort normal. Cardiovascular system: S1 & S2 heard, tachycardic, regular rhythm. No JVD, murmurs, rubs, gallops or clicks. No pedal edema. Gastrointestinal system: Abdomen is nondistended, soft and nontender. No organomegaly or masses felt. Normal bowel sounds heard. Central nervous system: Alert and oriented. No focal neurological deficits. Extremities: Symmetric 5 x 5 power. Skin: No rashes, lesions or ulcers. +molluscum  Psychiatry: Judgement and insight appear normal. Mood & affect appropriate.    Data Reviewed: I have personally reviewed following labs and imaging studies  CBC: Recent Labs  Lab 09/04/18 0630  09/05/18 1320  09/06/18 1749  09/08/18 0303 09/08/18 2319 09/09/18 0658 09/09/18 1926 09/10/18 0238  WBC 25.0*   < > 22.4*   < > 24.2*   < > 17.1* 22.3* 15.1*  16.2* 13.3*  NEUTROABS 20.1*  --  16.7*  --  18.9*  --   --  19.3*  --  13.0*  --   HGB 9.3*   < > 8.9*   < > 8.3*   < > 6.9* 8.4* 9.5* 10.3* 9.4*  HCT 28.5*   < > 27.1*   < > 26.3*   < > 20.9* 25.1* 28.4* 32.2* 28.1*  MCV 91.3   < > 89.1   < > 92.6   < > 87.8 85.4 87.7 91.0 84.9  PLT 20*   < > 27*   < > 22*   < > 8* 13* 12* 32* 13*   < > = values in this interval not displayed.   Basic Metabolic Panel: Recent Labs  Lab 09/03/18 1627 09/04/18 0630 09/05/18 0640 09/07/18 0127 09/10/18 0238  NA 127* 132* 131* 130* 134*  K 4.7 4.6 4.2 3.7 4.4  CL 100 107 106 104 105  CO2 17* 16* 16* 17* 21*  GLUCOSE 133* 140* 103* 91 86  BUN 17 18 23* 28* 21*  CREATININE 0.89 0.88 0.93 0.74 0.44  CALCIUM 7.1* 7.0* 7.6* 7.3* 7.3*   GFR: Estimated Creatinine Clearance: 81.6 mL/min (by C-G formula based on SCr of 0.44 mg/dL). Liver Function Tests: Recent Labs  Lab 09/03/18 1627 09/04/18 0630  AST 23 22  ALT 14 13  ALKPHOS 143* 142*  BILITOT 1.7* 2.1*  PROT 6.0* 6.2*  ALBUMIN 1.7* 1.7*   No results for input(s): LIPASE, AMYLASE in the last 168 hours. No results for input(s): AMMONIA in the last 168 hours. Coagulation Profile: Recent Labs  Lab 09/03/18 1758  INR 1.4*   Cardiac Enzymes: No results for input(s): CKTOTAL, CKMB, CKMBINDEX, TROPONINI in the last 168 hours. BNP (last 3 results) No results for input(s): PROBNP in the last 8760 hours. HbA1C: No results for input(s): HGBA1C in the last 72 hours. CBG: Recent Labs  Lab 09/06/18 0835 09/06/18 0927 09/07/18 0756 09/08/18 0750 09/09/18 0743  GLUCAP 58* 190* 74 72 83   Lipid Profile: No results for input(s): CHOL, HDL, LDLCALC, TRIG, CHOLHDL, LDLDIRECT in the last 72 hours. Thyroid Function Tests: No results for input(s): TSH, T4TOTAL, FREET4, T3FREE, THYROIDAB in the last 72 hours. Anemia Panel: No results for input(s): VITAMINB12, FOLATE, FERRITIN, TIBC, IRON, RETICCTPCT in the last 72 hours. Sepsis Labs: Recent Labs   Lab 09/03/18 1758  LATICACIDVEN 1.4    Recent Results (from the past 240 hour(s))  Urine culture     Status: None   Collection Time: 09/03/18  4:27 PM   Specimen: In/Out Cath Urine  Result Value Ref Range Status   Specimen Description  Final    IN/OUT CATH URINE Performed at [facility], [place] [address] [place], [place] [address]    Special Requests   Final    NONE Performed at [facility], [place] [address], [place], [place] [address]    Culture   Final    NO GROWTH Performed at [facility], [place] [address], [place], [place] [address]    Report Status 09/04/2018 FINAL  Final  SARS Coronavirus 2 (CEPHEID- Performed in [place] hospital lab), Hosp Order     Status: None   Collection Time: 09/03/18  4:30 PM   Specimen: Nasopharyngeal Swab  Result Value Ref Range Status   SARS Coronavirus 2 NEGATIVE NEGATIVE Final    Comment: (NOTE) If result is NEGATIVE SARS-CoV-2 target nucleic acids are NOT DETECTED. The SARS-CoV-2 RNA is generally detectable in upper and lower  respiratory specimens during the acute phase of infection. The lowest  concentration of SARS-CoV-2 viral copies this assay can detect is 250  copies / mL. A negative result does not preclude SARS-CoV-2 infection  and should not be used as the sole basis for treatment or other  patient management decisions.  A negative result may occur with  improper specimen collection / handling, submission of specimen other  than nasopharyngeal swab, presence of viral mutation(s) within the  areas targeted by this assay, and inadequate number of viral copies  (<250 copies / mL). A negative result must be combined with clinical  observations, patient history, and epidemiological information. If result is POSITIVE SARS-CoV-2 target nucleic acids are DETECTED. The SARS-CoV-2 RNA is generally detectable in upper and lower  respiratory specimens dur ing the acute phase of infection.   Positive  results are indicative of active infection with SARS-CoV-2.  Clinical  correlation with patient history and other diagnostic information is  necessary to determine patient infection status.  Positive results do  not rule out bacterial infection or co-infection with other viruses. If result is PRESUMPTIVE POSTIVE SARS-CoV-2 nucleic acids MAY BE PRESENT.   A presumptive positive result was obtained on the submitted specimen  and confirmed on repeat testing.  While 2019 novel coronavirus  (SARS-CoV-2) nucleic acids may be present in the submitted sample  additional confirmatory testing may be necessary for epidemiological  and / or clinical management purposes  to differentiate between  SARS-CoV-2 and other Sarbecovirus currently known to infect humans.  If clinically indicated additional testing with an alternate test  methodology [phone]) is advised. The SARS-CoV-2 RNA is generally  detectable in upper and lower respiratory sp ecimens during the acute  phase of infection. The expected result is Negative. Fact Sheet for Patients:  StrictlyIdeas.no Fact Sheet for Healthcare Providers: BankingDealers.co.za This test is not yet approved or cleared by the [place] FDA and has been authorized for detection and/or diagnosis of SARS-CoV-2 by FDA under an Emergency Use Authorization (EUA).  This EUA will remain in effect (meaning this test can be used) for the duration of the COVID-19 declaration under Section 564(b)(1) of the Act, 21 U.S.C. section 360bbb-3(b)(1), unless the authorization is terminated or revoked sooner. Performed at [facility], [place] [address] [place], [place] [address]   Blood Culture (routine x 2)     Status: None   Collection Time: 09/03/18  5:58 PM   Specimen: BLOOD  Result Value Ref Range Status   Specimen Description   Final    BLOOD LEFT ANTECUBITAL Performed at [facility],  [place] [address] [place], [place] [address]    Special Requests   Final    BOTTLES DRAWN AEROBIC ONLY Blood Culture adequate volume Performed at [place] [address], [place], [place] [address]    Culture   Final    NO GROWTH 5 DAYS Performed at [facility], [place] [address] [place], [place] [address]    Report Status 09/08/2018 FINAL  Final  Blood Culture (routine x 2)     Status: None   Collection Time: 09/03/18  5:58 PM   Specimen: BLOOD  Result Value Ref Range Status   Specimen Description   Final    BLOOD RIGHT ANTECUBITAL Performed at [place] [address] [place], [place] [address]    Special Requests   Final    BOTTLES DRAWN AEROBIC ONLY Blood Culture adequate volume Performed at [place] [address] [place], [place] [address]    Culture   Final    NO GROWTH 5 DAYS Performed at [facility], [place] [address] [place], [place] [address]    Report Status 09/08/2018 FINAL  Final  MRSA PCR Screening     Status: None   Collection Time: 09/03/18 10:14 PM   Specimen: Nasopharyngeal  Result Value Ref Range Status   MRSA by PCR NEGATIVE NEGATIVE Final    Comment:        The GeneXpert MRSA Assay (FDA approved for NASAL specimens only), is one component of a comprehensive MRSA colonization surveillance program. It is not intended to diagnose MRSA infection nor to guide or monitor treatment for MRSA infections. Performed at [facility], [place] [address], [place], [place] [address]   Respiratory Panel by PCR     Status: None   Collection Time: 09/03/18 10:14 PM   Specimen: Nasopharyngeal Swab; Respiratory  Result Value Ref Range Status   Adenovirus NOT DETECTED NOT DETECTED Final   Coronavirus 229E NOT DETECTED NOT DETECTED Final    Comment: (NOTE) The Coronavirus on the Respiratory Panel, DOES NOT test for the novel  Coronavirus (2019 nCoV)     Coronavirus HKU1 NOT DETECTED NOT DETECTED Final   Coronavirus NL63 NOT DETECTED NOT DETECTED Final   Coronavirus OC43 NOT DETECTED NOT DETECTED Final   Metapneumovirus NOT DETECTED NOT DETECTED Final   Rhinovirus / Enterovirus NOT DETECTED NOT DETECTED Final   Influenza A NOT DETECTED NOT DETECTED Final   Influenza B NOT DETECTED NOT DETECTED Final   Parainfluenza Virus 1 NOT DETECTED NOT DETECTED Final   Parainfluenza Virus 2 NOT DETECTED NOT DETECTED Final   Parainfluenza Virus 3 NOT DETECTED NOT DETECTED Final   Parainfluenza Virus 4 NOT DETECTED NOT DETECTED Final   Respiratory Syncytial Virus NOT DETECTED NOT DETECTED Final   Bordetella pertussis NOT DETECTED NOT DETECTED Final   Chlamydophila pneumoniae NOT DETECTED NOT DETECTED Final   Mycoplasma pneumoniae NOT DETECTED NOT DETECTED Final    Comment: Performed at [facility], [place]. [address] [place], [place] [address]  Culture, blood (routine x 2)     Status: None (Preliminary result)   Collection Time: 09/07/18  7:52 AM   Specimen: BLOOD RIGHT HAND  Result Value Ref Range Status   Specimen Description   Final    BLOOD RIGHT HAND Performed at [place] [address] [place], [place] [address]    Special Requests   Final    BOTTLES DRAWN AEROBIC ONLY Blood Culture  adequate volume Performed at [place] [address], [place], [place] [address]    Culture   Final    NO GROWTH 3 DAYS Performed at [facility], [place] [address], [place], [place] [address]    Report Status PENDING  Incomplete  Culture, blood (routine x 2)     Status: None (Preliminary result)   Collection Time: 09/07/18  7:52 AM   Specimen: BLOOD  Result Value Ref Range Status   Specimen Description   Final    BLOOD RIGHT ANTECUBITAL Performed at [place] [address] [place], [place] [address]    Special Requests   Final    BOTTLES DRAWN AEROBIC ONLY Blood Culture  results may not be optimal due to an inadequate volume of blood received in culture bottles Performed at [place] [address], [place], [place] [address]    Culture   Final    NO GROWTH 3 DAYS Performed at [facility], [place] [address] [place], [place] [address]    Report Status PENDING  Incomplete      Radiology Studies: No results found.    Scheduled Meds: . sodium chloride   Intravenous Once  . atovaquone  1,500 mg Oral Q breakfast  . azithromycin  1,200 mg Oral Weekly  . B-complex with vitamin C  1 tablet Oral Daily  . bictegravir-emtricitabine-tenofovir AF  1 tablet Oral Daily  . Chlorhexidine Gluconate Cloth  6 each Topical Daily  . folic acid  2 mg Oral Daily  . metoprolol tartrate  25 mg Oral BID  . predniSONE  10 mg Oral Q breakfast  . romiPLOStim  3 mcg/kg Subcutaneous Weekly  . sodium chloride flush  10-40 mL Intracatheter Q12H  . sodium chloride flush  3 mL Intravenous Q12H  . sodium chloride flush  3 mL Intravenous Q12H   Continuous Infusions: . sodium chloride Stopped (09/05/18 1507)     LOS: 7 days      Time spent: 25 minutes   Dessa Phi, DO [facility] [email or web address] 09/10/2018, 10:17 AM

## 2018-09-11 ENCOUNTER — Encounter (HOSPITAL_COMMUNITY): Payer: Self-pay | Admitting: Radiology

## 2018-09-11 ENCOUNTER — Inpatient Hospital Stay (HOSPITAL_COMMUNITY): Payer: 59

## 2018-09-11 LAB — CBC WITH DIFFERENTIAL/PLATELET
Abs Immature Granulocytes: 0.87 10*3/uL — ABNORMAL HIGH (ref 0.00–0.07)
Basophils Absolute: 0 10*3/uL (ref 0.0–0.1)
Basophils Relative: 0 %
Eosinophils Absolute: 0 10*3/uL (ref 0.0–0.5)
Eosinophils Relative: 0 %
HCT: 24.3 % — ABNORMAL LOW (ref 36.0–46.0)
Hemoglobin: 7.6 g/dL — ABNORMAL LOW (ref 12.0–15.0)
Immature Granulocytes: 10 %
Lymphocytes Relative: 18 %
Lymphs Abs: 1.6 10*3/uL (ref 0.7–4.0)
MCH: 28.4 pg (ref 26.0–34.0)
MCHC: 31.3 g/dL (ref 30.0–36.0)
MCV: 90.7 fL (ref 80.0–100.0)
Monocytes Absolute: 0.7 10*3/uL (ref 0.1–1.0)
Monocytes Relative: 8 %
Neutro Abs: 5.8 10*3/uL (ref 1.7–7.7)
Neutrophils Relative %: 64 %
Platelets: 17 10*3/uL — CL (ref 150–400)
RBC: 2.68 MIL/uL — ABNORMAL LOW (ref 3.87–5.11)
RDW: 17 % — ABNORMAL HIGH (ref 11.5–15.5)
WBC: 9 10*3/uL (ref 4.0–10.5)
nRBC: 2.5 % — ABNORMAL HIGH (ref 0.0–0.2)

## 2018-09-11 LAB — PREPARE PLATELET PHERESIS
Unit division: 0
Unit division: 0

## 2018-09-11 LAB — BPAM PLATELET PHERESIS
Blood Product Expiration Date: 202008042359
Blood Product Expiration Date: 202008052359
ISSUE DATE / TIME: 202008040537
ISSUE DATE / TIME: 202008041305
Unit Type and Rh: 6200
Unit Type and Rh: 7300

## 2018-09-11 LAB — BASIC METABOLIC PANEL
Anion gap: 8 (ref 5–15)
BUN: 14 mg/dL (ref 6–20)
CO2: 22 mmol/L (ref 22–32)
Calcium: 7.3 mg/dL — ABNORMAL LOW (ref 8.9–10.3)
Chloride: 106 mmol/L (ref 98–111)
Creatinine, Ser: 0.47 mg/dL (ref 0.44–1.00)
GFR calc Af Amer: >60 mL/min (ref >60)
GFR calc non Af Amer: >60 mL/min (ref >60)
Glucose, Bld: 86 mg/dL (ref 70–99)
Potassium: 4 mmol/L (ref 3.5–5.1)
Sodium: 136 mmol/L (ref 135–145)

## 2018-09-11 LAB — GLUCOSE, CAPILLARY: Glucose-Capillary: 72 mg/dL (ref 70–99)

## 2018-09-11 MED ORDER — FENTANYL CITRATE (PF) 100 MCG/2ML IJ SOLN
INTRAMUSCULAR | Status: AC | PRN
  Administered 2018-09-11 (×2): 50 ug via INTRAVENOUS

## 2018-09-11 MED ORDER — MIDAZOLAM HCL 2 MG/2ML IJ SOLN
INTRAMUSCULAR | Status: AC
  Filled 2018-09-11: qty 4

## 2018-09-11 MED ORDER — MIDAZOLAM HCL 2 MG/2ML IJ SOLN
INTRAMUSCULAR | Status: AC | PRN
  Administered 2018-09-11 (×2): 0.5 mg via INTRAVENOUS

## 2018-09-11 MED ORDER — SODIUM CHLORIDE 0.9 % IV SOLN
INTRAVENOUS | Status: AC
  Filled 2018-09-11: qty 250

## 2018-09-11 MED ORDER — LIDOCAINE HCL (PF) 1 % IJ SOLN
INTRAMUSCULAR | Status: AC | PRN
  Administered 2018-09-11: 10 mL

## 2018-09-11 MED ORDER — FENTANYL CITRATE (PF) 100 MCG/2ML IJ SOLN
INTRAMUSCULAR | Status: AC
  Filled 2018-09-11: qty 2

## 2018-09-11 NOTE — Progress Notes (Signed)
Pt arrived back on unit from IR for bone marrow biopsy. Pt resting and vital signs stable. Complains of moderate pain at procedure site but declines medication at this time. Will continue to monitor.

## 2018-09-11 NOTE — Procedures (Signed)
  Procedure: CT bone marrow biopsy   EBL:   minimal Complications:  none immediate  See full dictation in [facility].  Dillard Cannon MD Main # [phone] Pager  [phone]

## 2018-09-11 NOTE — Plan of Care (Signed)
  Problem: Education: Goal: Knowledge of General Education information will improve Description: Including pain rating scale, medication(s)/side effects and non-pharmacologic comfort measures Outcome: Progressing   Problem: Clinical Measurements: Goal: Ability to maintain clinical measurements within normal limits will improve Outcome: Progressing Goal: Will remain free from infection Outcome: Progressing Goal: Diagnostic test results will improve Outcome: Progressing Goal: Respiratory complications will improve Outcome: Progressing Goal: Cardiovascular complication will be avoided Outcome: Progressing   Problem: Activity: Goal: Risk for activity intolerance will decrease Outcome: Progressing   Problem: Nutrition: Goal: Adequate nutrition will be maintained Outcome: Progressing   Problem: Pain Managment: Goal: General experience of comfort will improve Outcome: Progressing   Problem: Safety: Goal: Ability to remain free from injury will improve Outcome: Progressing   Problem: Skin Integrity: Goal: Risk for impaired skin integrity will decrease Outcome: Progressing   

## 2018-09-11 NOTE — Progress Notes (Signed)
[facility]  PROGRESS NOTE  Krystal Short YKD:[id number] DOB: Dec 10, 1985 DOA: 09/03/2018 PCP: Lajean Manes, MD   Brief HPI:   33 year old female with a history of AIDS, recent admissions for pneumonia came to ED with generalized weakness, fatigue, hypotension, tachycardia.  Patient was noted to hospital last month and discharged on 08/22/2018 after treatment of recurrent atypical pneumonia complicated by pancytopenia, acute kidney injury and hyponatremia.  During recent admission she was treated for suspected pneumocystis pneumonia, transfused 7 units of platelets and 8 units PRBC.  Patient will tend to develop generalized weakness and fatigue 5 days ago, progressively worsening.  She had) dry cough, shortness of breath on exertion.  In the ED chest x-ray showed fine reticulonodular interstitial opacities bilaterally similar to most recent chest radiograph likely of infectious etiology.  Suspect multifocal atypical organism pneumonia.  Hemoglobin was 5.3 with platelets 16,000.  Patient was given 2 packed units of PRBC and 1 unit of platelet.  Started on empiric vancomycin, cefepime, clindamycin.  Infectious disease, oncology, pulmonology consulted.    Subjective   Patient seen and examined, denies abdominal pain or shortness of breath.   Assessment/Plan:     1. Generalized weakness, deconditioning in setting of anemia/thrombocytopenia-hemoglobin 5.3, platelet 16,000 on admission without bleeding.  Underwent extensive inpatient work-up during recent admission with unclear etiology.  She is status post 3 units PRBC, 8 units platelets so far.  Oncology following.  Completed IVIG x2 on 7/31 and 09/07/2018.  Prednisone is being weaned down.  Started on Nplate for possible ITP.  Patient underwent bone marrow biopsy today.  Will follow the results.  Transfuse for hemoglobin less than 7.5 and platelet count less than 20,000.  2. Atypical pneumonia-recent hospitalization discharge on 08/22/2018 at  that time autoimmune, vasculitis panel was unremarkable.  Fungal work-up negative, bronchoscopy on 07/12/2018 revealed scant lung parenchyma with fibrosis and reactive changes.  There was concern for PCP during that admission and started on primaquine, clindamycin which completed while inpatient.  ID at that point recommended open lung biopsy if stable from thrombocytopenia standpoint.  Deferred antiviral treatment of CMV as at that time he was felt to be commensal infection as an primary/causal role.  COVID-19 is negative, strep pneumo antigen is negative, MRSA PCR negative, blood cultures are negative to date.  Legionella antigen negative, conditional TB test is indeterminate.  Antibiotics have been stopped.  Pulmonology is planning to follow-up as outpatient for any progression of pulmonary infiltrates but for now no pulmonary intervention planned.  3. HIV/AIDS-CD4 count is 41.  Continue Biktarvy, Mepron and Zithromax for prophylaxis.  4. Hyponatremia-resolved, sodium 136    CBG: Recent Labs  Lab 09/06/18 0927 09/07/18 0756 09/08/18 0750 09/09/18 0743 09/10/18 0747  GLUCAP 190* 74 72 83 72    CBC: Recent Labs  Lab 09/08/18 2319  09/09/18 1926 09/10/18 0238 09/10/18 1056 09/10/18 1810 09/11/18 0435  WBC 22.3*   < > 16.2* 13.3* 12.0* 11.1* 9.0  NEUTROABS 19.3*  --  13.0*  --  8.6* 7.6 5.8  HGB 8.4*   < > 10.3* 9.4* 8.0* 7.9* 7.6*  HCT 25.1*   < > 32.2* 28.1* 24.6* 25.4* 24.3*  MCV 85.4   < > 91.0 84.9 87.5 91.4 90.7  PLT 13*   < > 32* 13* 18* 27* 17*   < > = values in this interval not displayed.    Basic Metabolic Panel: Recent Labs  Lab 09/05/18 0640 09/07/18 0127 09/10/18 0238 09/11/18 0435  NA 131* 130* 134* 136  K 4.2 3.7 4.4 4.0  CL 106 104 105 106  CO2 16* 17* 21* 22  GLUCOSE 103* 91 86 86  BUN 23* 28* 21* 14  CREATININE 0.93 0.74 0.44 0.47  CALCIUM 7.6* 7.3* 7.3* 7.3*     DVT prophylaxis: SCDs  Code Status: Full code  Family Communication: No family  at bedside  Disposition Plan: likely home when medically ready for discharge   Scheduled medications:  . sodium chloride   Intravenous Once  . atovaquone  1,500 mg Oral Q breakfast  . azithromycin  1,200 mg Oral Weekly  . B-complex with vitamin C  1 tablet Oral Daily  . bictegravir-emtricitabine-tenofovir AF  1 tablet Oral Daily  . folic acid  2 mg Oral Daily  . metoprolol tartrate  25 mg Oral BID  . predniSONE  10 mg Oral Q breakfast  . romiPLOStim  3 mcg/kg Subcutaneous Weekly  . sodium chloride flush  10-40 mL Intracatheter Q12H  . sodium chloride flush  3 mL Intravenous Q12H  . sodium chloride flush  3 mL Intravenous Q12H    Consultants:  Oncology  ID  Pulmonology  Procedures:  Bone marrow biopsy   Antibiotics:   Anti-infectives (From admission, onward)   Start     Dose/Rate Route Frequency Ordered Stop   09/10/18 0800  azithromycin (ZITHROMAX) tablet 1,200 mg     1,200 mg Oral Weekly 09/04/18 1517     09/04/18 1600  atovaquone (MEPRON) 750 MG/5ML suspension 1,500 mg     1,500 mg Oral Daily with breakfast 09/04/18 1517     09/04/18 1000  bictegravir-emtricitabine-tenofovir AF (BIKTARVY) 50-200-25 MG per tablet 1 tablet     1 tablet Oral Daily 09/03/18 2122     09/04/18 1000  primaquine tablet 30 mg  Status:  Discontinued     30 mg Oral Daily 09/03/18 2220 09/03/18 2222   09/04/18 0200  ceFEPIme (MAXIPIME) 2 g in sodium chloride 0.9 % 100 mL IVPB  Status:  Discontinued     2 g 200 mL/hr over 30 Minutes Intravenous Every 8 hours 09/03/18 2010 09/04/18 1429   09/03/18 2200  azithromycin (ZITHROMAX) 500 mg in sodium chloride 0.9 % 250 mL IVPB  Status:  Discontinued     500 mg 250 mL/hr over 60 Minutes Intravenous Daily at bedtime 09/03/18 2122 09/04/18 1429   09/03/18 2200  clindamycin (CLEOCIN) IVPB 900 mg  Status:  Discontinued     900 mg 100 mL/hr over 30 Minutes Intravenous Every 8 hours 09/03/18 2122 09/04/18 1051   09/03/18 2130  primaquine tablet 30 mg   Status:  Discontinued     30 mg Oral Daily 09/03/18 2122 09/04/18 1350   09/03/18 1500  vancomycin (VANCOCIN) 1,250 mg in sodium chloride 0.9 % 250 mL IVPB     1,250 mg 166.7 mL/hr over 90 Minutes Intravenous  Once 09/03/18 1452 09/04/18 0714   09/03/18 1445  ceFEPIme (MAXIPIME) 2 g in sodium chloride 0.9 % 100 mL IVPB     2 g 200 mL/hr over 30 Minutes Intravenous  Once 09/03/18 1444 09/03/18 1922   09/03/18 1445  metroNIDAZOLE (FLAGYL) IVPB 500 mg     500 mg 100 mL/hr over 60 Minutes Intravenous  Once 09/03/18 1444 09/03/18 1922   09/03/18 1445  vancomycin (VANCOCIN) IVPB 1000 mg/200 mL premix  Status:  Discontinued     1,000 mg 200 mL/hr over 60 Minutes Intravenous  Once 09/03/18 1444 09/03/18 1452       Objective  Vitals:   09/11/18 0939 09/11/18 1005 09/11/18 1046 09/11/18 1423  BP: 106/86 101/83 103/80 113/88  Pulse: 90 87 87 92  Resp: (!) 28 (!) 24 (!) 21 18  Temp: 98.1 F (36.7 C) 98.1 F (36.7 C) 98.4 F (36.9 C) 98.8 F (37.1 C)  TempSrc: Oral Oral Oral Oral  SpO2: 93% 96% 95% 96%  Weight:      Height:        Intake/Output Summary (Last 24 hours) at 09/11/2018 1731 Last data filed at 09/11/2018 0600 Gross per 24 hour  Intake 3 ml  Output -  Net 3 ml   Filed Weights   09/09/18 0500 09/11/18 0455  Weight: 56.7 kg 58 kg     Physical Examination:    General: Appears in no acute distress  Cardiovascular: S1-S2, regular  Respiratory: Clear to auscultation bilaterally  Abdomen: Abdomen is soft, nontender, no organomegaly  Extremities: No edema in the lower extremities  Neurologic: Alert, oriented x3, no focal deficit noted     Data Reviewed: I have personally reviewed following labs and imaging studies   Recent Results (from the past 240 hour(s))  Urine culture     Status: None   Collection Time: 09/03/18  4:27 PM   Specimen: In/Out Cath Urine  Result Value Ref Range Status   Specimen Description   Final    IN/OUT CATH URINE Performed at  [facility], [place] [address], [place], [place] [address]    Special Requests   Final    NONE Performed at [facility], [place] [address] [place], [place] [address]    Culture   Final    NO GROWTH Performed at [facility], [place] [address], [place], [place] [address]    Report Status 09/04/2018 FINAL  Final  SARS Coronavirus 2 (CEPHEID- Performed in [place] hospital lab), Hosp Order     Status: None   Collection Time: 09/03/18  4:30 PM   Specimen: Nasopharyngeal Swab  Result Value Ref Range Status   SARS Coronavirus 2 NEGATIVE NEGATIVE Final    Comment: (NOTE) If result is NEGATIVE SARS-CoV-2 target nucleic acids are NOT DETECTED. The SARS-CoV-2 RNA is generally detectable in upper and lower  respiratory specimens during the acute phase of infection. The lowest  concentration of SARS-CoV-2 viral copies this assay can detect is 250  copies / mL. A negative result does not preclude SARS-CoV-2 infection  and should not be used as the sole basis for treatment or other  patient management decisions.  A negative result may occur with  improper specimen collection / handling, submission of specimen other  than nasopharyngeal swab, presence of viral mutation(s) within the  areas targeted by this assay, and inadequate number of viral copies  (<250 copies / mL). A negative result must be combined with clinical  observations, patient history, and epidemiological information. If result is POSITIVE SARS-CoV-2 target nucleic acids are DETECTED. The SARS-CoV-2 RNA is generally detectable in upper and lower  respiratory specimens dur ing the acute phase of infection.  Positive  results are indicative of active infection with SARS-CoV-2.  Clinical  correlation with patient history and other diagnostic information is  necessary to determine patient infection status.  Positive results do  not rule out bacterial infection or co-infection with other  viruses. If result is PRESUMPTIVE POSTIVE SARS-CoV-2 nucleic acids MAY BE PRESENT.   A presumptive positive result was obtained on the submitted specimen  and confirmed on repeat testing.  While 2019 novel coronavirus  (SARS-CoV-2) nucleic acids may be present in the submitted sample  additional confirmatory testing may be necessary for epidemiological  and / or clinical management purposes  to differentiate between  SARS-CoV-2 and other Sarbecovirus currently known to infect humans.  If clinically indicated additional testing with an alternate test  methodology [phone]) is advised. The SARS-CoV-2 RNA is generally  detectable in upper and lower respiratory sp ecimens during the acute  phase of infection. The expected result is Negative. Fact Sheet for Patients:  StrictlyIdeas.no Fact Sheet for Healthcare Providers: BankingDealers.co.za This test is not yet approved or cleared by the [place] FDA and has been authorized for detection and/or diagnosis of SARS-CoV-2 by FDA under an Emergency Use Authorization (EUA).  This EUA will remain in effect (meaning this test can be used) for the duration of the COVID-19 declaration under Section 564(b)(1) of the Act, 21 U.S.C. section 360bbb-3(b)(1), unless the authorization is terminated or revoked sooner. Performed at [facility], [place] [address] [place], [place] [address]   Blood Culture (routine x 2)     Status: None   Collection Time: 09/03/18  5:58 PM   Specimen: BLOOD  Result Value Ref Range Status   Specimen Description   Final    BLOOD LEFT ANTECUBITAL Performed at [place] [address], [place], [place] [address]    Special Requests   Final    BOTTLES DRAWN AEROBIC ONLY Blood Culture adequate volume Performed at [place] [address] [place], [place] [address]    Culture   Final    NO GROWTH 5  DAYS Performed at [facility], [place] [address], [place], [place] [address]    Report Status 09/08/2018 FINAL  Final  Blood Culture (routine x 2)     Status: None   Collection Time: 09/03/18  5:58 PM   Specimen: BLOOD  Result Value Ref Range Status   Specimen Description   Final    BLOOD RIGHT ANTECUBITAL Performed at [place] [address], [place], [place] [address]    Special Requests   Final    BOTTLES DRAWN AEROBIC ONLY Blood Culture adequate volume Performed at [place] [address] [place], [place] [address]    Culture   Final    NO GROWTH 5 DAYS Performed at [facility], [place] [address], [place], [place] [address]    Report Status 09/08/2018 FINAL  Final  MRSA PCR Screening     Status: None   Collection Time: 09/03/18 10:14 PM   Specimen: Nasopharyngeal  Result Value Ref Range Status   MRSA by PCR NEGATIVE NEGATIVE Final    Comment:        The GeneXpert MRSA Assay (FDA approved for NASAL specimens only), is one component of a comprehensive MRSA colonization surveillance program. It is not intended to diagnose MRSA infection nor to guide or monitor treatment for MRSA infections. Performed at [facility], [place] [address], [place], [place] [address]   Respiratory Panel by PCR     Status: None   Collection Time: 09/03/18 10:14 PM   Specimen: Nasopharyngeal Swab; Respiratory  Result Value Ref Range Status   Adenovirus NOT DETECTED NOT DETECTED Final   Coronavirus 229E NOT DETECTED NOT DETECTED Final    Comment: (NOTE) The Coronavirus on the Respiratory Panel, DOES NOT test for the novel  Coronavirus (2019 nCoV)    Coronavirus HKU1  NOT DETECTED NOT DETECTED Final   Coronavirus NL63 NOT DETECTED NOT DETECTED Final   Coronavirus OC43 NOT DETECTED NOT DETECTED Final   Metapneumovirus NOT DETECTED NOT DETECTED Final   Rhinovirus / Enterovirus NOT DETECTED NOT DETECTED Final   Influenza  A NOT DETECTED NOT DETECTED Final   Influenza B NOT DETECTED NOT DETECTED Final   Parainfluenza Virus 1 NOT DETECTED NOT DETECTED Final   Parainfluenza Virus 2 NOT DETECTED NOT DETECTED Final   Parainfluenza Virus 3 NOT DETECTED NOT DETECTED Final   Parainfluenza Virus 4 NOT DETECTED NOT DETECTED Final   Respiratory Syncytial Virus NOT DETECTED NOT DETECTED Final   Bordetella pertussis NOT DETECTED NOT DETECTED Final   Chlamydophila pneumoniae NOT DETECTED NOT DETECTED Final   Mycoplasma pneumoniae NOT DETECTED NOT DETECTED Final    Comment: Performed at [facility], [place] [address], [place], [place] [address]  Culture, blood (routine x 2)     Status: None (Preliminary result)   Collection Time: 09/07/18  7:52 AM   Specimen: BLOOD RIGHT HAND  Result Value Ref Range Status   Specimen Description   Final    BLOOD RIGHT HAND Performed at [place] [address], [place], [place] [address]    Special Requests   Final    BOTTLES DRAWN AEROBIC ONLY Blood Culture adequate volume Performed at [facility] [address], [place], [place] [address]    Culture   Final    NO GROWTH 4 DAYS Performed at [facility], [place] [address], [place], [place] [address]    Report Status PENDING  Incomplete  Culture, blood (routine x 2)     Status: None (Preliminary result)   Collection Time: 09/07/18  7:52 AM   Specimen: BLOOD  Result Value Ref Range Status   Specimen Description   Final    BLOOD RIGHT ANTECUBITAL Performed at [place] [address], [place], [place] [address]    Special Requests   Final    BOTTLES DRAWN AEROBIC ONLY Blood Culture results may not be optimal due to an inadequate volume of blood received in culture bottles Performed at [place] [address] [place], [place] [address]    Culture   Final    NO GROWTH 4 DAYS Performed at [facility], [place] [address]  [place], [place] [address]    Report Status PENDING  Incomplete  Aerobic/Anaerobic Culture (surgical/deep wound)     Status: None (Preliminary result)   Collection Time: 09/11/18  9:20 AM   Specimen: BONE MARROW  Result Value Ref Range Status   Specimen Description   Final    BONE MARROW Performed at [place] [address], [place], [place] [address]    Special Requests   Final    NONE Performed at [facility], [place] [address] [place], [place] [address]    Gram Stain   Final    FEW WBC PRESENT, PREDOMINANTLY MONONUCLEAR NO ORGANISMS SEEN Performed at [facility], [place] [address], [place], [place] [address]    Culture PENDING  Incomplete   Report Status PENDING  Incomplete     Liver Function Tests: No results for input(s): AST, ALT, ALKPHOS, BILITOT, PROT, ALBUMIN in the last 168 hours. No results for input(s): LIPASE, AMYLASE in the last 168 hours. No results for input(s): AMMONIA in the last 168 hours.  Cardiac Enzymes: No results for input(s): CKTOTAL,  CKMB, CKMBINDEX, TROPONINI in the last 168 hours. BNP (last 3 results) Recent Labs    03/08/18 2129 08/02/18 1520  BNP 8.8 144.0*    ProBNP (last 3 results) No results for input(s): PROBNP in the last 8760 hours.    Studies: Ct Biopsy  Result Date: Sep 13, 2018 CLINICAL DATA:  HIV positive. Elevated white blood cell count, anemia, thrombocytopenia EXAM: CT GUIDED DEEP ILIAC BONE ASPIRATION AND CORE BIOPSY TECHNIQUE: Patient was placed prone on the CT gantry and limited axial scans through the pelvis were obtained. Appropriate skin entry site was identified. Skin site was marked, prepped with chlorhexidine, draped in usual sterile fashion, and infiltrated locally with 1% lidocaine. Intravenous Fentanyl 173mg and Versed 273mwere administered as conscious sedation during continuous monitoring of the patient's level of consciousness and physiological / cardiorespiratory status by  the radiology RN, with a total moderate sedation time of 12 minutes. Under CT fluoroscopic guidance an 11-gauge Cook trocar bone needle was advanced into the right iliac bone just lateral to the sacroiliac joint. Once needle tip position was confirmed, core and aspiration samples were obtained, submitted to pathology for approval. A second core was obtained for cultures as requested. Patient tolerated procedure well. COMPLICATIONS: COMPLICATIONS none IMPRESSION: 1. Technically successful CT guided right iliac bone core and aspiration biopsy. Electronically Signed   By: D Lucrezia Europe.D.   On: 082020-08-072:40   Ct Bone Marrow Biopsy & Aspiration  Result Date: 09/2018/08/07LINICAL DATA:  HIV positive. Elevated white blood cell count, anemia, thrombocytopenia EXAM: CT GUIDED DEEP ILIAC BONE ASPIRATION AND CORE BIOPSY TECHNIQUE: Patient was placed prone on the CT gantry and limited axial scans through the pelvis were obtained. Appropriate skin entry site was identified. Skin site was marked, prepped with chlorhexidine, draped in usual sterile fashion, and infiltrated locally with 1% lidocaine. Intravenous Fentanyl 10052mand Versed 2mg5mre administered as conscious sedation during continuous monitoring of the patient's level of consciousness and physiological / cardiorespiratory status by the radiology RN, with a total moderate sedation time of 12 minutes. Under CT fluoroscopic guidance an 11-gauge Cook trocar bone needle was advanced into the right iliac bone just lateral to the sacroiliac joint. Once needle tip position was confirmed, core and aspiration samples were obtained, submitted to pathology for approval. A second core was obtained for cultures as requested. Patient tolerated procedure well. COMPLICATIONS: COMPLICATIONS none IMPRESSION: 1. Technically successful CT guided right iliac bone core and aspiration biopsy. Electronically Signed   By: D  HLucrezia Europe.   On: 08/008/08/2038     Admission status:  Inpatient: Based on patients clinical presentation and evaluation of above clinical data, I have made determination that patient meets Inpatient criteria at this time.  Time spent: 20 min  [facility] Pager [phone] 7PM-7AM, please contact night-coverage at [email or web address], Office  [phone] [id number]  09/11/06-07-202031 PM  LOS: 8 days

## 2018-09-12 ENCOUNTER — Inpatient Hospital Stay: Payer: 59

## 2018-09-12 LAB — COMPREHENSIVE METABOLIC PANEL
ALT: 24 U/L (ref 0–44)
AST: 52 U/L — ABNORMAL HIGH (ref 15–41)
Albumin: 1.5 g/dL — ABNORMAL LOW (ref 3.5–5.0)
Alkaline Phosphatase: 230 U/L — ABNORMAL HIGH (ref 38–126)
Anion gap: 3 — ABNORMAL LOW (ref 5–15)
BUN: 9 mg/dL (ref 6–20)
CO2: 23 mmol/L (ref 22–32)
Calcium: 7.3 mg/dL — ABNORMAL LOW (ref 8.9–10.3)
Chloride: 109 mmol/L (ref 98–111)
Creatinine, Ser: 0.4 mg/dL — ABNORMAL LOW (ref 0.44–1.00)
GFR calc Af Amer: >60 mL/min (ref >60)
GFR calc non Af Amer: >60 mL/min (ref >60)
Glucose, Bld: 89 mg/dL (ref 70–99)
Potassium: 4.1 mmol/L (ref 3.5–5.1)
Sodium: 135 mmol/L (ref 135–145)
Total Bilirubin: 1.4 mg/dL — ABNORMAL HIGH (ref 0.3–1.2)
Total Protein: 5.6 g/dL — ABNORMAL LOW (ref 6.5–8.1)

## 2018-09-12 LAB — CULTURE, BLOOD (ROUTINE X 2)
Culture: NO GROWTH
Culture: NO GROWTH
Special Requests: ADEQUATE

## 2018-09-12 LAB — CBC
HCT: 24 % — ABNORMAL LOW (ref 36.0–46.0)
Hemoglobin: 7.7 g/dL — ABNORMAL LOW (ref 12.0–15.0)
MCH: 29.4 pg (ref 26.0–34.0)
MCHC: 32.1 g/dL (ref 30.0–36.0)
MCV: 91.6 fL (ref 80.0–100.0)
Platelets: 18 10*3/uL — CL (ref 150–400)
RBC: 2.62 MIL/uL — ABNORMAL LOW (ref 3.87–5.11)
RDW: 17.1 % — ABNORMAL HIGH (ref 11.5–15.5)
WBC: 6.7 10*3/uL (ref 4.0–10.5)
nRBC: 3.4 % — ABNORMAL HIGH (ref 0.0–0.2)

## 2018-09-12 LAB — ACID FAST SMEAR (AFB, MYCOBACTERIA): Acid Fast Smear: NEGATIVE

## 2018-09-12 MED ORDER — ROMIPLOSTIM 250 MCG ~~LOC~~ SOLR
2.0000 ug/kg | Freq: Once | SUBCUTANEOUS | Status: AC
  Administered 2018-09-12: 120 ug via SUBCUTANEOUS
  Filled 2018-09-12: qty 0.24

## 2018-09-12 NOTE — Progress Notes (Signed)
Received call from nurse regarding reason for current isolation precautions. Patient with molluscum contagiosum on face due to immune suppression. ID had asked for initiation of contact precautions.

## 2018-09-12 NOTE — Progress Notes (Signed)
[facility]  PROGRESS NOTE  Krystal Short TWS:[id number] DOB: Jun 12, 1985 DOA: 09/03/2018 PCP: Lajean Manes, MD   Brief HPI:   33 year old female with a history of AIDS, recent admissions for pneumonia came to ED with generalized weakness, fatigue, hypotension, tachycardia.  Patient was noted to hospital last month and discharged on 08/22/2018 after treatment of recurrent atypical pneumonia complicated by pancytopenia, acute kidney injury and hyponatremia.  During recent admission she was treated for suspected pneumocystis pneumonia, transfused 7 units of platelets and 8 units PRBC.  Patient will tend to develop generalized weakness and fatigue 5 days ago, progressively worsening.  She had) dry cough, shortness of breath on exertion.  In the ED chest x-ray showed fine reticulonodular interstitial opacities bilaterally similar to most recent chest radiograph likely of infectious etiology.  Suspect multifocal atypical organism pneumonia.  Hemoglobin was 5.3 with platelets 16,000.  Patient was given 2 packed units of PRBC and 1 unit of platelet.  Started on empiric vancomycin, cefepime, clindamycin.  Infectious disease, oncology, pulmonology consulted.    Subjective   Patient seen and examined, denies chest pain or shortness of breath.   Assessment/Plan:     1. Generalized weakness, deconditioning in setting of anemia/thrombocytopenia-hemoglobin 5.3, platelet 16,000 on admission without bleeding.  Underwent extensive inpatient work-up during recent admission with unclear etiology.  She is status post 3 units PRBC, 8 units platelets so far.  Oncology following.  Completed IVIG x2 on 7/31 and 09/07/2018.  Prednisone is being weaned down.  Started on Nplate for possible ITP.  Patient underwent bone marrow biopsy today.  Will follow the results.  Transfuse for hemoglobin less than 7.5 and platelet count less than 20,000.  Discussed with oncologist Dr. Irene Limbo, and as per him patient can be discharged  home.  He will follow patient as outpatient for platelet transfusion and Nplate.  2. Atypical pneumonia-recent hospitalization discharge on 08/22/2018 at that time autoimmune, vasculitis panel was unremarkable.  Fungal work-up negative, bronchoscopy on 07/12/2018 revealed scant lung parenchyma with fibrosis and reactive changes.  There was concern for PCP during that admission and started on primaquine, clindamycin which completed while inpatient.  ID at that point recommended open lung biopsy if stable from thrombocytopenia standpoint.  Deferred antiviral treatment of CMV as at that time he was felt to be commensal infection as an primary/causal role.  COVID-19 is negative, strep pneumo antigen is negative, MRSA PCR negative, blood cultures are negative to date.  Legionella antigen negative, conditional TB test is indeterminate.  Antibiotics have been stopped.  Pulmonology is planning to follow-up as outpatient for any progression of pulmonary infiltrates but for now no pulmonary intervention planned.  3. HIV/AIDS-CD4 count is 41.  Continue Biktarvy, Mepron and Zithromax for prophylaxis.  4. Hyponatremia-resolved, sodium 136    CBG: Recent Labs  Lab 09/06/18 0927 09/07/18 0756 09/08/18 0750 09/09/18 0743 09/10/18 0747  GLUCAP 190* 74 72 83 72    CBC: Recent Labs  Lab 09/08/18 2319  09/09/18 1926 09/10/18 0238 09/10/18 1056 09/10/18 1810 09/11/18 0435 09/12/18 0412  WBC 22.3*   < > 16.2* 13.3* 12.0* 11.1* 9.0 6.7  NEUTROABS 19.3*  --  13.0*  --  8.6* 7.6 5.8  --   HGB 8.4*   < > 10.3* 9.4* 8.0* 7.9* 7.6* 7.7*  HCT 25.1*   < > 32.2* 28.1* 24.6* 25.4* 24.3* 24.0*  MCV 85.4   < > 91.0 84.9 87.5 91.4 90.7 91.6  PLT 13*   < > 32* 13* 18* 27*  17* 18*   < > = values in this interval not displayed.    Basic Metabolic Panel: Recent Labs  Lab 09/07/18 0127 09/10/18 0238 09/11/18 0435 09/12/18 0412  NA 130* 134* 136 135  K 3.7 4.4 4.0 4.1  CL 104 105 106 109  CO2 17* 21* 22 23   GLUCOSE 91 86 86 89  BUN 28* 21* 14 9  CREATININE 0.74 0.44 0.47 0.40*  CALCIUM 7.3* 7.3* 7.3* 7.3*     DVT prophylaxis: SCDs  Code Status: Full code  Family Communication: No family at bedside  Disposition Plan: likely home when medically ready for discharge   Scheduled medications:  . sodium chloride   Intravenous Once  . atovaquone  1,500 mg Oral Q breakfast  . azithromycin  1,200 mg Oral Weekly  . B-complex with vitamin C  1 tablet Oral Daily  . bictegravir-emtricitabine-tenofovir AF  1 tablet Oral Daily  . folic acid  2 mg Oral Daily  . metoprolol tartrate  25 mg Oral BID  . predniSONE  10 mg Oral Q breakfast  . romiPLOStim  3 mcg/kg Subcutaneous Weekly  . sodium chloride flush  10-40 mL Intracatheter Q12H  . sodium chloride flush  3 mL Intravenous Q12H  . sodium chloride flush  3 mL Intravenous Q12H    Consultants:  Oncology  ID  Pulmonology  Procedures:  Bone marrow biopsy   Antibiotics:   Anti-infectives (From admission, onward)   Start     Dose/Rate Route Frequency Ordered Stop   09/10/18 0800  azithromycin (ZITHROMAX) tablet 1,200 mg     1,200 mg Oral Weekly 09/04/18 1517     09/04/18 1600  atovaquone (MEPRON) 750 MG/5ML suspension 1,500 mg     1,500 mg Oral Daily with breakfast 09/04/18 1517     09/04/18 1000  bictegravir-emtricitabine-tenofovir AF (BIKTARVY) 50-200-25 MG per tablet 1 tablet     1 tablet Oral Daily 09/03/18 2122     09/04/18 1000  primaquine tablet 30 mg  Status:  Discontinued     30 mg Oral Daily 09/03/18 2220 09/03/18 2222   09/04/18 0200  ceFEPIme (MAXIPIME) 2 g in sodium chloride 0.9 % 100 mL IVPB  Status:  Discontinued     2 g 200 mL/hr over 30 Minutes Intravenous Every 8 hours 09/03/18 2010 09/04/18 1429   09/03/18 2200  azithromycin (ZITHROMAX) 500 mg in sodium chloride 0.9 % 250 mL IVPB  Status:  Discontinued     500 mg 250 mL/hr over 60 Minutes Intravenous Daily at bedtime 09/03/18 2122 09/04/18 1429   09/03/18 2200   clindamycin (CLEOCIN) IVPB 900 mg  Status:  Discontinued     900 mg 100 mL/hr over 30 Minutes Intravenous Every 8 hours 09/03/18 2122 09/04/18 1051   09/03/18 2130  primaquine tablet 30 mg  Status:  Discontinued     30 mg Oral Daily 09/03/18 2122 09/04/18 1350   09/03/18 1500  vancomycin (VANCOCIN) 1,250 mg in sodium chloride 0.9 % 250 mL IVPB     1,250 mg 166.7 mL/hr over 90 Minutes Intravenous  Once 09/03/18 1452 09/04/18 0714   09/03/18 1445  ceFEPIme (MAXIPIME) 2 g in sodium chloride 0.9 % 100 mL IVPB     2 g 200 mL/hr over 30 Minutes Intravenous  Once 09/03/18 1444 09/03/18 1922   09/03/18 1445  metroNIDAZOLE (FLAGYL) IVPB 500 mg     500 mg 100 mL/hr over 60 Minutes Intravenous  Once 09/03/18 1444 09/03/18 1922   09/03/18 1445  vancomycin (VANCOCIN) IVPB 1000 mg/200 mL premix  Status:  Discontinued     1,000 mg 200 mL/hr over 60 Minutes Intravenous  Once 09/03/18 1444 09/03/18 1452       Objective   Vitals:   09/11/18 1046 09/11/18 1423 09/11/18 2046 09/12/18 0511  BP: 103/80 113/88 102/71 110/78  Pulse: 87 92 90 93  Resp: (!) _0 Temp: 98.4 F (36.9 C) 98.8 F (37.1 C) 97.6 F (36.4 C) 98.2 F (36.8 C)  TempSrc: Oral Oral Oral Oral  SpO2: 95% 96% 98% 98%  Weight:    61 kg  Height:        Intake/Output Summary (Last 24 hours) at 09/12/2018 1257 Last data filed at 09/12/2018 1037 Gross per 24 hour  Intake 723 ml  Output 204 ml  Net 519 ml   Filed Weights   09/09/18 0500 09/11/18 0455 09/12/18 0511  Weight: 56.7 kg 58 kg 61 kg     Physical Examination:  General-appears in no acute distress Heart-S1-S2, regular, no murmur auscultated Lungs-clear to auscultation bilaterally, no wheezing or crackles auscultated Abdomen-soft, nontender, no organomegaly Extremities-no edema in the lower extremities Neuro-alert, oriented x3, no focal deficit noted    Data Reviewed: I have personally reviewed following labs and imaging studies   Recent Results (from  the past 240 hour(s))  Urine culture     Status: None   Collection Time: 09/03/18  4:27 PM   Specimen: In/Out Cath Urine  Result Value Ref Range Status   Specimen Description   Final    IN/OUT CATH URINE Performed at [facility], [place] [address], [place], [place] [address]    Special Requests   Final    NONE Performed at [facility], [place] [address], [place], [place] [address]    Culture   Final    NO GROWTH Performed at [facility], [place] [address], [place], [place] [address]    Report Status 09/04/2018 FINAL  Final  SARS Coronavirus 2 (CEPHEID- Performed in [facility] lab), Hosp Order     Status: None   Collection Time: 09/03/18  4:30 PM   Specimen: Nasopharyngeal Swab  Result Value Ref Range Status   SARS Coronavirus 2 NEGATIVE NEGATIVE Final    Comment: (NOTE) If result is NEGATIVE SARS-CoV-2 target nucleic acids are NOT DETECTED. The SARS-CoV-2 RNA is generally detectable in upper and lower  respiratory specimens during the acute phase of infection. The lowest  concentration of SARS-CoV-2 viral copies this assay can detect is 250  copies / mL. A negative result does not preclude SARS-CoV-2 infection  and should not be used as the sole basis for treatment or other  patient management decisions.  A negative result may occur with  improper specimen collection / handling, submission of specimen other  than nasopharyngeal swab, presence of viral mutation(s) within the  areas targeted by this assay, and inadequate number of viral copies  (<250 copies / mL). A negative result must be combined with clinical  observations, patient history, and epidemiological information. If result is POSITIVE SARS-CoV-2 target nucleic acids are DETECTED. The SARS-CoV-2 RNA is generally detectable in upper and lower  respiratory specimens dur ing the acute phase of infection.  Positive  results are indicative of active infection with  SARS-CoV-2.  Clinical  correlation with patient history and other diagnostic information is  necessary to determine patient infection status.  Positive results do  not rule out bacterial infection  or co-infection with other viruses. If result is PRESUMPTIVE POSTIVE SARS-CoV-2 nucleic acids MAY BE PRESENT.   A presumptive positive result was obtained on the submitted specimen  and confirmed on repeat testing.  While 2019 novel coronavirus  (SARS-CoV-2) nucleic acids may be present in the submitted sample  additional confirmatory testing may be necessary for epidemiological  and / or clinical management purposes  to differentiate between  SARS-CoV-2 and other Sarbecovirus currently known to infect humans.  If clinically indicated additional testing with an alternate test  methodology [phone]) is advised. The SARS-CoV-2 RNA is generally  detectable in upper and lower respiratory sp ecimens during the acute  phase of infection. The expected result is Negative. Fact Sheet for Patients:  StrictlyIdeas.no Fact Sheet for Healthcare Providers: BankingDealers.co.za This test is not yet approved or cleared by the [place] FDA and has been authorized for detection and/or diagnosis of SARS-CoV-2 by FDA under an Emergency Use Authorization (EUA).  This EUA will remain in effect (meaning this test can be used) for the duration of the COVID-19 declaration under Section 564(b)(1) of the Act, 21 U.S.C. section 360bbb-3(b)(1), unless the authorization is terminated or revoked sooner. Performed at [facility], [place] [address], [place], [place] [address]   Blood Culture (routine x 2)     Status: None   Collection Time: 09/03/18  5:58 PM   Specimen: BLOOD  Result Value Ref Range Status   Specimen Description   Final    BLOOD LEFT ANTECUBITAL Performed at [place] [address], [place], [place] [address]     Special Requests   Final    BOTTLES DRAWN AEROBIC ONLY Blood Culture adequate volume Performed at [place] [address] [place], [place] [address]    Culture   Final    NO GROWTH 5 DAYS Performed at [facility], [place] [address], [place], [place] [address]    Report Status 09/08/2018 FINAL  Final  Blood Culture (routine x 2)     Status: None   Collection Time: 09/03/18  5:58 PM   Specimen: BLOOD  Result Value Ref Range Status   Specimen Description   Final    BLOOD RIGHT ANTECUBITAL Performed at [place] [address] [place], [place] [address]    Special Requests   Final    BOTTLES DRAWN AEROBIC ONLY Blood Culture adequate volume Performed at [place] [address], [place],  [place] [address]    Culture   Final    NO GROWTH 5 DAYS Performed at [facility], [place] [address] [place], [place] [address]    Report Status 09/08/2018 FINAL  Final  MRSA PCR Screening     Status: None   Collection Time: 09/03/18 10:14 PM   Specimen: Nasopharyngeal  Result Value Ref Range Status   MRSA by PCR NEGATIVE NEGATIVE Final    Comment:        The GeneXpert MRSA Assay (FDA approved for NASAL specimens only), is one component of a comprehensive MRSA colonization surveillance program. It is not intended to diagnose MRSA infection nor to guide or monitor treatment for MRSA infections. Performed at [facility], [place] [address], [place], [place] [address]   Respiratory Panel by PCR     Status: None   Collection Time: 09/03/18 10:14 PM   Specimen: Nasopharyngeal Swab; Respiratory  Result Value Ref Range Status   Adenovirus NOT DETECTED NOT DETECTED Final  Coronavirus 229E NOT DETECTED NOT DETECTED Final    Comment: (NOTE) The Coronavirus on the Respiratory Panel, DOES NOT test for the novel  Coronavirus (2019 nCoV)    Coronavirus HKU1 NOT DETECTED NOT DETECTED Final    Coronavirus NL63 NOT DETECTED NOT DETECTED Final   Coronavirus OC43 NOT DETECTED NOT DETECTED Final   Metapneumovirus NOT DETECTED NOT DETECTED Final   Rhinovirus / Enterovirus NOT DETECTED NOT DETECTED Final   Influenza A NOT DETECTED NOT DETECTED Final   Influenza B NOT DETECTED NOT DETECTED Final   Parainfluenza Virus 1 NOT DETECTED NOT DETECTED Final   Parainfluenza Virus 2 NOT DETECTED NOT DETECTED Final   Parainfluenza Virus 3 NOT DETECTED NOT DETECTED Final   Parainfluenza Virus 4 NOT DETECTED NOT DETECTED Final   Respiratory Syncytial Virus NOT DETECTED NOT DETECTED Final   Bordetella pertussis NOT DETECTED NOT DETECTED Final   Chlamydophila pneumoniae NOT DETECTED NOT DETECTED Final   Mycoplasma pneumoniae NOT DETECTED NOT DETECTED Final    Comment: Performed at [facility], [place] [address] [place], [place] [address]  Culture, blood (routine x 2)     Status: None (Preliminary result)   Collection Time: 09/07/18  7:52 AM   Specimen: BLOOD RIGHT HAND  Result Value Ref Range Status   Specimen Description   Final    BLOOD RIGHT HAND Performed at [place] [address], [place], [place] [address]    Special Requests   Final    BOTTLES DRAWN AEROBIC ONLY Blood Culture adequate volume Performed at [place] [address], [place], [place] [address]    Culture   Final    NO GROWTH 4 DAYS Performed at [facility], [place] [address] [place], [place] [address]    Report Status PENDING  Incomplete  Culture, blood (routine x 2)     Status: None (Preliminary result)   Collection Time: 09/07/18  7:52 AM   Specimen: BLOOD  Result Value Ref Range Status   Specimen Description   Final    BLOOD RIGHT ANTECUBITAL Performed at [place] [address] [place], [place] [address]    Special Requests   Final    BOTTLES DRAWN AEROBIC ONLY Blood Culture results may not be optimal due to an inadequate volume  of blood received in culture bottles Performed at [place] [address] [place] [address], [place], [place] [address]    Culture   Final    NO GROWTH 4 DAYS Performed at [facility], [place] [address] [place], [place] [address]    Report Status PENDING  Incomplete  Aerobic/Anaerobic Culture (surgical/deep wound)     Status: None (Preliminary result)   Collection Time: 09/11/18  9:20 AM   Specimen: BONE MARROW  Result Value Ref Range Status   Specimen Description   Final    BONE MARROW Performed at [place] [address] [place], [place] [address]    Special Requests   Final    NONE Performed at [facility], [place] [address], [place], [place] [address]    Gram Stain   Final    FEW WBC PRESENT, PREDOMINANTLY MONONUCLEAR NO ORGANISMS SEEN    Culture   Final    NO GROWTH < 24 HOURS Performed at [facility] [address], [place],  [address]    Report Status PENDING  Incomplete     Liver Function Tests: Recent Labs  Lab 09/12/18 0412  AST 52*  ALT 24  ALKPHOS 230*  BILITOT 1.4*  PROT 5.6*  ALBUMIN 1.5*   No results for input(s): LIPASE, AMYLASE in the last 168 hours. No results for input(s): AMMONIA in the last 168 hours.  Cardiac Enzymes: No results for input(s): CKTOTAL, CKMB, CKMBINDEX, TROPONINI in the last 168 hours. BNP (last 3 results) Recent Labs    03/08/18 2129 08/02/18 1520  BNP 8.8 144.0*    ProBNP (last 3 results) No results for input(s): PROBNP in the last 8760 hours.    Studies: Ct Biopsy  Result Date: 10/04/18 CLINICAL DATA:  HIV positive. Elevated white blood cell count, anemia, thrombocytopenia EXAM: CT GUIDED DEEP ILIAC BONE ASPIRATION AND CORE BIOPSY TECHNIQUE: Patient was placed prone on the CT gantry and limited axial scans through the pelvis were obtained. Appropriate skin entry site was identified. Skin site was marked, prepped with chlorhexidine, draped in usual  sterile fashion, and infiltrated locally with 1% lidocaine. Intravenous Fentanyl 152mg and Versed 236mwere administered as conscious sedation during continuous monitoring of the patient's level of consciousness and physiological / cardiorespiratory status by the radiology RN, with a total moderate sedation time of 12 minutes. Under CT fluoroscopic guidance an 11-gauge Cook trocar bone needle was advanced into the right iliac bone just lateral to the sacroiliac joint. Once needle tip position was confirmed, core and aspiration samples were obtained, submitted to pathology for approval. A second core was obtained for cultures as requested. Patient tolerated procedure well. COMPLICATIONS: COMPLICATIONS none IMPRESSION: 1. Technically successful CT guided right iliac bone core and aspiration biopsy. Electronically Signed   By: D Lucrezia Europe.D.   On: 082020-08-282:40   Ct Bone Marrow Biopsy & Aspiration  Result Date: 8/August 28, 2020LINICAL DATA:  HIV positive. Elevated white blood cell count, anemia, thrombocytopenia EXAM: CT GUIDED DEEP ILIAC BONE ASPIRATION AND CORE BIOPSY TECHNIQUE: Patient was placed prone on the CT gantry and limited axial scans through the pelvis were obtained. Appropriate skin entry site was identified. Skin site was marked, prepped with chlorhexidine, draped in usual sterile fashion, and infiltrated locally with 1% lidocaine. Intravenous Fentanyl 10065mand Versed 2mg70mre administered as conscious sedation during continuous monitoring of the patient's level of consciousness and physiological / cardiorespiratory status by the radiology RN, with a total moderate sedation time of 12 minutes. Under CT fluoroscopic guidance an 11-gauge Cook trocar bone needle was advanced into the right iliac bone just lateral to the sacroiliac joint. Once needle tip position was confirmed, core and aspiration samples were obtained, submitted to pathology for approval. A second core was obtained for cultures as  requested. Patient tolerated procedure well. COMPLICATIONS: COMPLICATIONS none IMPRESSION: 1. Technically successful CT guided right iliac bone core and aspiration biopsy. Electronically Signed   By: D  HLucrezia Europe.   On: 08/0Aug 28, 202040     Admission status: Inpatient: Based on patients clinical presentation and evaluation of above clinical data, I have made determination that patient meets Inpatient criteria at this time.  Time spent: 20 min  [facility] Pager [phone] 7PM-7AM, please contact night-coverage at [email or web address], Office  [phone] [id number]  09/12/2018, 12:57 PM  LOS: 9 days

## 2018-09-13 ENCOUNTER — Ambulatory Visit: Payer: 59

## 2018-09-13 ENCOUNTER — Other Ambulatory Visit: Payer: Self-pay | Admitting: Hematology

## 2018-09-13 LAB — CBC
HCT: 25.9 % — ABNORMAL LOW (ref 36.0–46.0)
Hemoglobin: 7.9 g/dL — ABNORMAL LOW (ref 12.0–15.0)
MCH: 28.9 pg (ref 26.0–34.0)
MCHC: 30.5 g/dL (ref 30.0–36.0)
MCV: 94.9 fL (ref 80.0–100.0)
Platelets: 23 10*3/uL — CL (ref 150–400)
RBC: 2.73 MIL/uL — ABNORMAL LOW (ref 3.87–5.11)
RDW: 16.9 % — ABNORMAL HIGH (ref 11.5–15.5)
WBC: 7.5 10*3/uL (ref 4.0–10.5)
nRBC: 4.6 % — ABNORMAL HIGH (ref 0.0–0.2)

## 2018-09-13 LAB — PREPARE RBC (CROSSMATCH)

## 2018-09-13 MED ORDER — HEPARIN SOD (PORK) LOCK FLUSH 100 UNIT/ML IV SOLN: 500.0000 [IU] | Freq: Every day | INTRAVENOUS | Status: DC | PRN

## 2018-09-13 MED ORDER — SODIUM CHLORIDE 0.9% FLUSH: 10.0000 mL | INTRAVENOUS | Status: DC | PRN

## 2018-09-13 MED ORDER — AZITHROMYCIN 600 MG PO TABS: 1200.0000 mg | ORAL_TABLET | ORAL | 0 refills | Status: DC

## 2018-09-13 MED ORDER — POLYETHYLENE GLYCOL 3350 17 G PO PACK: 17.0000 g | PACK | Freq: Every day | ORAL | 0 refills | Status: DC | PRN

## 2018-09-13 MED ORDER — PREDNISONE 10 MG PO TABS: ORAL_TABLET | ORAL | 0 refills | Status: DC

## 2018-09-13 MED ORDER — HEPARIN SOD (PORK) LOCK FLUSH 100 UNIT/ML IV SOLN: 250.0000 [IU] | INTRAVENOUS | Status: DC | PRN

## 2018-09-13 MED ORDER — ONDANSETRON HCL 4 MG PO TABS: 4.0000 mg | ORAL_TABLET | Freq: Four times a day (QID) | ORAL | 0 refills | Status: DC

## 2018-09-13 MED ORDER — METOPROLOL TARTRATE 25 MG PO TABS: 12.5000 mg | ORAL_TABLET | Freq: Two times a day (BID) | ORAL | 2 refills | Status: DC

## 2018-09-13 MED ORDER — SODIUM CHLORIDE 0.9% FLUSH: 3.0000 mL | INTRAVENOUS | Status: DC | PRN

## 2018-09-13 MED ORDER — SODIUM CHLORIDE 0.9% IV SOLUTION
250.0000 mL | Freq: Once | INTRAVENOUS | Status: AC
  Administered 2018-09-13: 250 mL via INTRAVENOUS

## 2018-09-13 NOTE — Progress Notes (Signed)
Krystal Short   HEMATOLOGY/ONCOLOGY INPATIENT PROGRESS NOTE  Date of Service: 09/13/2018  Inpatient Attending: .Oswald Hillock, MD   SUBJECTIVE  Patient is seen in f/u for her multfactorial anemia and thrombocytopenia that has required multiple P tRBC and PLT transfusions. She notes no fevers today.Notes no issues with bleeding. Received additional dose of Nplate 72mg/kg. PLT today at 23k up from 18k Uneventful BM BMX --results pending -- will likely be available next week. Discussed possibly transfusing1 unit of PRBC prior to discharge to reduce risk of early decompensation.  OBJECTIVE:  NAD  PHYSICAL EXAMINATION: . Vitals:   09/12/18 0511 09/12/18 1418 09/12/18 2043 09/13/18 0537  BP: 110/78 107/85 109/82 99/72  Pulse: 93 86 88 84  Resp: 20 18  20   Temp: 98.2 F (36.8 C) 98.2 F (36.8 C)  98 F (36.7 C)  TempSrc: Oral Oral  Oral  SpO2: 98% 99% 100% 95%  Weight: 134 lb 7.7 oz (61 kg)   125 lb (56.7 kg)  Height:       Filed Weights   09/11/18 0455 09/12/18 0511 09/13/18 0537  Weight: 127 lb 13.9 oz (58 kg) 134 lb 7.7 oz (61 kg) 125 lb (56.7 kg)   .Body mass index is 24.01 kg/m.  GENERAL:alert, in no acute distress  NECK: supple, no JVD, thyroid normal size, non-tender, without nodularity LYMPH:  no palpable lymphadenopathy in the cervical, axillary or inguinal LUNGS: clear to auscultation with normal respiratory effort HEART: regular rate & rhythm,  no murmurs and no lower extremity edema ABDOMEN: abdomen soft, non-tender. Musculoskeletal: no cyanosis of digits and no clubbing  PSYCH: alert & oriented x 3 with fluent speech NEURO: no focal motor/sensory deficits  MEDICAL HISTORY:  Past Medical History:  Diagnosis Date   Acute hyponatremia 06/03/2017   Anemia    CAP (community acquired pneumonia) 06/03/2017   Facial dermatitis 06/03/2017   HIV [place] virus infection) [place]    Pleurisy 06/03/2017   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  [place]    Thrush of mouth and esophagus ([facility])    UTI (urinary tract infection)     SURGICAL HISTORY: Past Surgical History:  Procedure Laterality Date   BIOPSY  07/12/2018   Procedure: BIOPSY;  Surgeon: OLaurin Coder MD;  Location: [facility];  Service: Endoscopy;;   BRONCHIAL WASHINGS  07/12/2018   Procedure: BRONCHIAL WASHINGS;  Surgeon: OLaurin Coder MD;  Location: [facility];  Service: Endoscopy;;   ENDOBRONCHIAL ULTRASOUND N/A 07/12/2018   Procedure: ENDOBRONCHIAL ULTRASOUND;  Surgeon: OLaurin Coder MD;  Location: [facility];  Service: Endoscopy;  Laterality: N/A;   FINE NEEDLE ASPIRATION BIOPSY  07/12/2018   Procedure: FINE NEEDLE ASPIRATION BIOPSY;  Surgeon: OLaurin Coder MD;  Location: [facility];  Service: Endoscopy;;   NO PAST SURGERIES     VIDEO BRONCHOSCOPY N/A 07/12/2018   Procedure: VIDEO BRONCHOSCOPY WITHOUT FLUORO;  Surgeon: OLaurin Coder MD;  Location: [facility];  Service: Endoscopy;  Laterality: N/A;    SOCIAL HISTORY: Social History   Socioeconomic History   Marital status: Married    Spouse name: Not on file   Number of children: Not on file   Years of education: college   Highest education level: Bachelor's degree (e.g., BA, AB, BS)  Occupational History   Occupation: [employer]   Employer: [employer]   Financial resource strain: Not hard at all   Food insecurity    Worry: Never true  Inability: Never true   Transportation needs    Medical: No    Non-medical: No  Tobacco Use   Smoking status: Former Smoker    Packs/day: 0.25    Years: 8.00    Pack years: 2.00    Types: Cigarettes, Cigars    Quit date: 12/25/2016    Years since quitting: 1.7   Smokeless tobacco: Never Used  Substance and Sexual Activity   Alcohol use: Yes    Comment: weekend/socially   Drug use: No   Sexual activity: Not Currently    Birth control/protection: Injection  Lifestyle    Physical activity    Days per week: 0 days    Minutes per session: 0 min   Stress: Not at all  Relationships   Social connections    Talks on phone: Once a week    Gets together: Once a week    Attends religious service: More than 4 times per year    Active member of club or organization: No    Attends meetings of clubs or organizations: Never    Relationship status: Married   Intimate partner violence    Fear of current or ex partner: No    Emotionally abused: No    Physically abused: No    Forced sexual activity: No  Other Topics Concern   Not on file  Social History Narrative   Patient does not drive, if her husband cannot provide transportation she takes uses Ly[employer] services   She has not smoked since 2018    FAMILY HISTORY: Family History  Problem Relation Age of Onset   Healthy Father    Healthy Mother    Asthma Paternal Grandmother     ALLERGIES:  is allergic to heparin.  MEDICATIONS:  Scheduled Meds:  sodium chloride   Intravenous Once   atovaquone  1,500 mg Oral Q breakfast   azithromycin  1,200 mg Oral Weekly   B-complex with vitamin C  1 tablet Oral Daily   bictegravir-emtricitabine-tenofovir AF  1 tablet Oral Daily   folic acid  2 mg Oral Daily   metoprolol tartrate  25 mg Oral BID   predniSONE  10 mg Oral Q breakfast   sodium chloride flush  10-40 mL Intracatheter Q12H   sodium chloride flush  3 mL Intravenous Q12H   sodium chloride flush  3 mL Intravenous Q12H   Continuous Infusions:  sodium chloride Stopped (09/05/18 1507)   PRN Meds:.sodium chloride, acetaminophen **OR** acetaminophen, guaiFENesin, HYDROcodone-acetaminophen, metoprolol tartrate, ondansetron (ZOFRAN) IV, polyethylene glycol, sodium chloride flush, sodium chloride flush  REVIEW OF SYSTEMS:    10 Point review of Systems was done is negative except as noted above.   LABORATORY DATA:  I have reviewed the data as listed  . CBC Latest Ref Rng & Units 09/13/2018  09/12/2018 09/11/2018  WBC 4.0 - 10.5 K/uL 7.5 6.7 9.0  Hemoglobin 12.0 - 15.0 g/dL 7.9(L) 7.7(L) 7.6(L)  Hematocrit 36.0 - 46.0 % 25.9(L) 24.0(L) 24.3(L)  Platelets 150 - 400 K/uL 23(LL) 18(LL) 17(LL)    . CMP Latest Ref Rng & Units 09/12/2018 09/11/2018 09/10/2018  Glucose 70 - 99 mg/dL 89 86 86  BUN 6 - 20 mg/dL 9 14 21(H)  Creatinine 0.44 - 1.00 mg/dL 0.40(L) 0.47 0.44  Sodium 135 - 145 mmol/L 135 136 134(L)  Potassium 3.5 - 5.1 mmol/L 4.1 4.0 4.4  Chloride 98 - 111 mmol/L 109 106 105  CO2 22 - 32 mmol/L 23 22 21(L)  Calcium 8.9 - 10.3 mg/dL  7.3(L) 7.3(L) 7.3(L)  Total Protein 6.5 - 8.1 g/dL 5.6(L) - -  Total Bilirubin 0.3 - 1.2 mg/dL 1.4(H) - -  Alkaline Phos 38 - 126 U/L 230(H) - -  AST 15 - 41 U/L 52(H) - -  ALT 0 - 44 U/L 24 - -     RADIOGRAPHIC STUDIES: I have personally reviewed the radiological images as listed and agreed with the findings in the report. Ct Biopsy  Result Date: 09/11/2018 CLINICAL DATA:  HIV positive. Elevated white blood cell count, anemia, thrombocytopenia EXAM: CT GUIDED DEEP ILIAC BONE ASPIRATION AND CORE BIOPSY TECHNIQUE: Patient was placed prone on the CT gantry and limited axial scans through the pelvis were obtained. Appropriate skin entry site was identified. Skin site was marked, prepped with chlorhexidine, draped in usual sterile fashion, and infiltrated locally with 1% lidocaine. Intravenous Fentanyl 153mg and Versed 26mwere administered as conscious sedation during continuous monitoring of the patient's level of consciousness and physiological / cardiorespiratory status by the radiology RN, with a total moderate sedation time of 12 minutes. Under CT fluoroscopic guidance an 11-gauge Cook trocar bone needle was advanced into the right iliac bone just lateral to the sacroiliac joint. Once needle tip position was confirmed, core and aspiration samples were obtained, submitted to pathology for approval. A second core was obtained for cultures as requested.  Patient tolerated procedure well. COMPLICATIONS: COMPLICATIONS none IMPRESSION: 1. Technically successful CT guided right iliac bone core and aspiration biopsy. Electronically Signed   By: D Lucrezia Europe.D.   On: 09/11/2018 12:40   Dg Chest Port 1 View  Result Date: 09/03/2018 CLINICAL DATA:  Cough and fever.  History of HIV disease EXAM: PORTABLE CHEST 1 VIEW COMPARISON:  August 12, 2018 chest radiograph; CT angiogram chest August 02, 2018 FINDINGS: There remains fine reticulonodular interstitial disease throughout the lungs, primarily in the mid and lower lung zones. The appearance is similar to most recent chest radiograph with less opacity overall compared to most recent CT. No new opacity evident. No consolidation. Heart size and pulmonary vascularity are normal. No adenopathy. No bone lesions. IMPRESSION: Fine reticulonodular interstitial opacity bilaterally, similar to most recent chest radiograph, likely of infectious etiology. Suspect multifocal atypical organism pneumonia. No consolidation. No new opacity. No adenopathy evident. Electronically Signed   By: WiLowella GripII M.D.   On: 09/03/2018 15:39   Ct Bone Marrow Biopsy & Aspiration  Result Date: 09/11/2018 CLINICAL DATA:  HIV positive. Elevated white blood cell count, anemia, thrombocytopenia EXAM: CT GUIDED DEEP ILIAC BONE ASPIRATION AND CORE BIOPSY TECHNIQUE: Patient was placed prone on the CT gantry and limited axial scans through the pelvis were obtained. Appropriate skin entry site was identified. Skin site was marked, prepped with chlorhexidine, draped in usual sterile fashion, and infiltrated locally with 1% lidocaine. Intravenous Fentanyl 10037mand Versed 2mg9mre administered as conscious sedation during continuous monitoring of the patient's level of consciousness and physiological / cardiorespiratory status by the radiology RN, with a total moderate sedation time of 12 minutes. Under CT fluoroscopic guidance an 11-gauge Cook trocar  bone needle was advanced into the right iliac bone just lateral to the sacroiliac joint. Once needle tip position was confirmed, core and aspiration samples were obtained, submitted to pathology for approval. A second core was obtained for cultures as requested. Patient tolerated procedure well. COMPLICATIONS: COMPLICATIONS none IMPRESSION: 1. Technically successful CT guided right iliac bone core and aspiration biopsy. Electronically Signed   By: D  HLucrezia Europe.   On:  09/11/2018 12:40    ASSESSMENT & PLAN:   1. Anemia  2. Thrombocytopenia 3. Leukocytosis 4. PCP recently, recurrent sepsis. Unclear etiology of pulmonary infiltrates. 5. HIV/AIDS on Biktarvy. On Atovaquone for PCP prophylaxis and Azithromycin for MAI prophylaxis  6. Splenomegaly noted on prior abdominal ultrasound PLAN  -Anemia thrombocytopenia are likely multifactorial.  PBS with decreased platelets. No MAHA. Leucocytosis with myeloid left shift and Dohle bodies. Worsening anemia and thrombocytopenia in tandem with increasing leucocytosis with toxic granulations is again concerning for recurrent sepsis with bone marrow suppression vs viral pneumonitis with Bone marrow suppression.(r/o CMV, EBV etc) Baseline anemia of chronic disease from HIV/AIDS. Given increase in Immature platelet fraction cannot r/o HIV related ITP. LDH levels progressively normalizing and does not co-related with anemia and appears to be related likely to lung inflammation and previous elevated LFTs. Her severely depletion nutritional status with severe protein calorie malnutrition from recurrent sepsis is also playing into her cytopenias. (Albumin level of 1.7) Plan Transfusion support for hgb<7.5 - however would transfuse to to hgb about 9 prior to discharge to reduce risk of rehospitalization -transfuse platelets<20k in setting of sepsis, once sepsis is resolved would transfuse to keep plt >10k. -mx of sepsis per hospitalist/ID -patient received IVIG  1g/kg daily x 2 doses on 7/31 and 8/1 -may continue to wean off steroids from hematology standpoint over the next 2-3 weeks. -still with unclear pulmonary process which pulmonary want to w/u as outpatient -rpt Bone marrow biopsy results pending - will f/u as outpatient if discharged - will schedule for weekly Nplate@ 57mg/kg as outpatient and adjust dose as needed.  GSullivan LoneMD MS  I spent 25 minutes counseling the patient face to face. The total time spent in the appointment was 35 minutes and more than 50% was on counseling and direct patient cares.    GSullivan LoneMD MVenetaAAHIVMS SRedding Endoscopy CenterCLifecare Hospitals Of ShreveportHematology/Oncology Physician CPerimeter [facility] (Office):       [phone](Work cell):  [phone](Fax):           [phone] 09/13/2018 10:01 AM

## 2018-09-13 NOTE — Discharge Summary (Addendum)
Physician Discharge Summary  Krystal Short ZDG:[id number] DOB: February 19, 1985 DOA: 09/03/2018  PCP: Lajean Manes, MD  Admit date: 09/03/2018 Discharge date: 09/13/2018  Time spent: 40* minutes  Recommendations for Outpatient Follow-up:  1. Follow-up oncology in 2 weeks 2. Follow-up ID in 1 week    Discharge Diagnoses:  Principal Problem:   Sepsis due to pneumonia [facility]) Active Problems:   Hyponatremia   AIDS (acquired immune deficiency syndrome) ([place])   Symptomatic anemia   Thrombocytopenia (HCC)   Anemia   Discharge Condition: Stable  Diet recommendation: Regular diet  Filed Weights   09/11/18 0455 09/12/18 0511 09/13/18 0537  Weight: 58 kg 61 kg 56.7 kg    History of present illness:  33 year old female with a history of AIDS, recent admissions for pneumonia came to ED with generalized weakness, fatigue, hypotension, tachycardia.  Patient was noted to hospital last month and discharged on 08/22/2018 after treatment of recurrent atypical pneumonia complicated by pancytopenia, acute kidney injury and hyponatremia.  During recent admission she was treated for suspected pneumocystis pneumonia, transfused 7 units of platelets and 8 units PRBC.  Patient will tend to develop generalized weakness and fatigue 5 days ago, progressively worsening.  She had) dry cough, shortness of breath on exertion.  In the ED chest x-ray showed fine reticulonodular interstitial opacities bilaterally similar to most recent chest radiograph likely of infectious etiology.  Suspect multifocal atypical organism pneumonia.  Hemoglobin was 5.3 with platelets 16,000.  Patient was given 2 packed units of PRBC and 1 unit of platelet.  Started on empiric vancomycin, cefepime, clindamycin.  Infectious disease, oncology, pulmonology consulted.   Hospital Course:   1. Generalized weakness, deconditioning in setting of anemia/thrombocytopenia-hemoglobin 5.3, platelet 16,000 on admission without bleeding.  Underwent  extensive inpatient work-up during recent admission with unclear etiology.  She is status post 3 units PRBC, 8 units platelets so far.  Oncology following.  Completed IVIG x2 on 7/31 and 09/07/2018.  Prednisone is being weaned down.  Started on Nplate for possible ITP.  Patient underwent bone marrow biopsy today.  Will follow the results.  Transfuse for hemoglobin less than 7.5, patient is on 1 unit PRBC today before discharge as goal is to keep hemoglobin around 9.  And platelet count less than 20,000.  Discussed with oncologist Dr. Irene Limbo, and as per him patient can be discharged home.  He will follow patient as outpatient for platelet transfusion and Nplate.  Patient will be discharged on prednisone taper over the next 3 weeks.  2. Atypical pneumonia-recent hospitalization discharge on 08/22/2018 at that time autoimmune, vasculitis panel was unremarkable.  Fungal work-up negative, bronchoscopy on 07/12/2018 revealed scant lung parenchyma with fibrosis and reactive changes.  There was concern for PCP during that admission and started on primaquine, clindamycin which completed while inpatient.  ID at that point recommended open lung biopsy if stable from thrombocytopenia standpoint.  Deferred antiviral treatment of CMV as at that time he was felt to be commensal infection as an primary/causal role.  COVID-19 is negative, strep pneumo antigen is negative, MRSA PCR negative, blood cultures are negative to date.  Legionella antigen negative, conditional TB test is indeterminate.  Antibiotics have been stopped.  Pulmonology is planning to follow-up as outpatient for any progression of pulmonary infiltrates but for now no pulmonary intervention planned.  3. HIV/AIDS-CD4 count is 41.  Continue Biktarvy, Mepron and Zithromax for prophylaxis.  Follow-up ID in 1 week  4. Hyponatremia-resolved, sodium 136  5. Molluscum contagiosum-patient has molluscum contagiosum on face,  she is followed by dermatology at [facility].  Patient  to call and make an appointment.  6. Hypertension/sinus tachycardia-patient was started on metoprolol 25 mg p.o. twice daily for hypertension, echocardiogram shows diastolic dysfunction.  Will cut down the dose of metoprolol to 12.5 mg p.o. twice daily to avoid hypotension.  Patient will be discharged on metoprolol 12.5 mg p.o. twice daily.  Procedures:  Bone marrow biopsy, results pending.  Oncology to follow as outpatient  Consultations:  Oncology  Infectious disease  Pulmonology  Discharge Exam: Vitals:   09/13/18 0537 09/13/18 1013  BP: 99/72 112/84  Pulse: 84 91  Resp: 20   Temp: 98 F (36.7 C)   SpO2: 95%     General: Appears in no acute distress Cardiovascular: S1-S2, regular Respiratory: Clear to auscultation bilaterally  Discharge Instructions   Discharge Instructions    Care order/instruction   Complete by: As directed    Transfuse Parameters   Complete patient signature process for consent form   Complete by: As directed    Diet - low sodium heart healthy   Complete by: As directed    Increase activity slowly   Complete by: As directed    Practitioner attestation of consent   Complete by: As directed    I, the ordering practitioner, attest that I have discussed with the patient the benefits, risks, side effects, alternatives, likelihood of achieving goals and potential problems during recovery for the procedure listed.   Procedure: Blood Product(s)   Type and screen   Complete by: As directed    [place]      Allergies as of 09/13/2018      Reactions   Heparin Other (See Comments)   Unknown reaction      Medication List    TAKE these medications   acetaminophen 325 MG tablet Commonly known as: TYLENOL Take 650 mg by mouth every 6 (six) hours as needed for mild pain or headache.   atovaquone 750 MG/5ML suspension Commonly known as: MEPRON Take 10 mLs (1,500 mg total) by mouth daily.   azithromycin 600 MG  tablet Commonly known as: ZITHROMAX Take 2 tablets (1,200 mg total) by mouth once a week. Start taking on: September 17, 2018   B-complex with vitamin C tablet Take 1 tablet by mouth daily.   bictegravir-emtricitabine-tenofovir AF 50-200-25 MG Tabs tablet Commonly known as: Biktarvy Take 1 tablet by mouth daily.   folic acid 1 MG tablet Commonly known as: FOLVITE Take 2 tablets (2 mg total) by mouth daily.   metoprolol tartrate 25 MG tablet Commonly known as: LOPRESSOR Take 0.5 tablets (12.5 mg total) by mouth 2 (two) times daily.   ondansetron 4 MG tablet Commonly known as: ZOFRAN Take 1 tablet (4 mg total) by mouth every 6 (six) hours.   polyethylene glycol 17 g packet Commonly known as: MIRALAX / GLYCOLAX Take 17 g by mouth daily as needed for mild constipation.   predniSONE 10 MG tablet Commonly known as: DELTASONE Prednisone 10 mg daily x 14 days then  Prednisone 5 mg po daily x 7 days then stop What changed:   how much to take  how to take this  when to take this  additional instructions      Allergies  Allergen Reactions  . Heparin Other (See Comments)    Unknown reaction   Follow-up Information    Stoneking, Hal, MD Follow up in 2 week(s).   Specialty: Internal Medicine Contact information: [address] [place] [address] [phone]        Brunetta Genera, MD Follow up in 2 week(s).   Specialties: Hematology, Oncology Contact information: [place] [address] [phone]        Campbell Riches, MD Follow up in 1 week(s).   Specialty: Infectious Diseases Contact information: [place] [address] [phone]            The results of significant diagnostics from this hospitalization (including imaging, microbiology, ancillary and laboratory) are listed below for reference.    Significant Diagnostic Studies: Ct Biopsy  Result Date:  09/11/2018 CLINICAL DATA:  HIV positive. Elevated white blood cell count, anemia, thrombocytopenia EXAM: CT GUIDED DEEP ILIAC BONE ASPIRATION AND CORE BIOPSY TECHNIQUE: Patient was placed prone on the CT gantry and limited axial scans through the pelvis were obtained. Appropriate skin entry site was identified. Skin site was marked, prepped with chlorhexidine, draped in usual sterile fashion, and infiltrated locally with 1% lidocaine. Intravenous Fentanyl 114mg and Versed 276mwere administered as conscious sedation during continuous monitoring of the patient's level of consciousness and physiological / cardiorespiratory status by the radiology RN, with a total moderate sedation time of 12 minutes. Under CT fluoroscopic guidance an 11-gauge Cook trocar bone needle was advanced into the right iliac bone just lateral to the sacroiliac joint. Once needle tip position was confirmed, core and aspiration samples were obtained, submitted to pathology for approval. A second core was obtained for cultures as requested. Patient tolerated procedure well. COMPLICATIONS: COMPLICATIONS none IMPRESSION: 1. Technically successful CT guided right iliac bone core and aspiration biopsy. Electronically Signed   By: D Lucrezia Europe.D.   On: 09/11/2018 12:40   Dg Chest Port 1 View  Result Date: 09/03/2018 CLINICAL DATA:  Cough and fever.  History of HIV disease EXAM: PORTABLE CHEST 1 VIEW COMPARISON:  August 12, 2018 chest radiograph; CT angiogram chest August 02, 2018 FINDINGS: There remains fine reticulonodular interstitial disease throughout the lungs, primarily in the mid and lower lung zones. The appearance is similar to most recent chest radiograph with less opacity overall compared to most recent CT. No new opacity evident. No consolidation. Heart size and pulmonary vascularity are normal. No adenopathy. No bone lesions. IMPRESSION: Fine reticulonodular interstitial opacity bilaterally, similar to most recent chest radiograph, likely  of infectious etiology. Suspect multifocal atypical organism pneumonia. No consolidation. No new opacity. No adenopathy evident. Electronically Signed   By: WiLowella GripII M.D.   On: 09/03/2018 15:39   Ct Bone Marrow Biopsy & Aspiration  Result Date: 09/11/2018 CLINICAL DATA:  HIV positive. Elevated white blood cell count, anemia, thrombocytopenia EXAM: CT GUIDED DEEP ILIAC BONE ASPIRATION AND CORE BIOPSY TECHNIQUE: Patient was placed prone on the CT gantry and limited axial scans through the pelvis were obtained. Appropriate skin entry site was identified. Skin site was marked, prepped with chlorhexidine, draped in usual sterile fashion, and infiltrated locally with 1% lidocaine. Intravenous Fentanyl 10016mand Versed 2mg6mre administered as conscious sedation during continuous monitoring of the patient's level of consciousness and physiological / cardiorespiratory status by the radiology RN, with a total moderate sedation time of 12 minutes. Under CT fluoroscopic guidance an 11-gauge Cook trocar bone needle was advanced into the right iliac bone just lateral to the sacroiliac joint. Once needle tip position was confirmed, core and aspiration samples were obtained, submitted to pathology for approval. A second core was obtained for cultures as requested. Patient  tolerated procedure well. COMPLICATIONS: COMPLICATIONS none IMPRESSION: 1. Technically successful CT guided right iliac bone core and aspiration biopsy. Electronically Signed   By: Lucrezia Europe M.D.   On: 09/11/2018 12:40    Microbiology: Recent Results (from the past 240 hour(s))  Urine culture     Status: None   Collection Time: 09/03/18  4:27 PM   Specimen: In/Out Cath Urine  Result Value Ref Range Status   Specimen Description   Final    IN/OUT CATH URINE Performed at [facility], [place] [address], [place], [place] [address]    Special Requests   Final    NONE Performed at [facility], [place]  [address], [place], [place] [address]    Culture   Final    NO GROWTH Performed at [facility], [place] [address], [place], [place] [address]    Report Status 09/04/2018 FINAL  Final  SARS Coronavirus 2 (CEPHEID- Performed in [place] hospital lab), Hosp Order     Status: None   Collection Time: 09/03/18  4:30 PM   Specimen: Nasopharyngeal Swab  Result Value Ref Range Status   SARS Coronavirus 2 NEGATIVE NEGATIVE Final    Comment: (NOTE) If result is NEGATIVE SARS-CoV-2 target nucleic acids are NOT DETECTED. The SARS-CoV-2 RNA is generally detectable in upper and lower  respiratory specimens during the acute phase of infection. The lowest  concentration of SARS-CoV-2 viral copies this assay can detect is 250  copies / mL. A negative result does not preclude SARS-CoV-2 infection  and should not be used as the sole basis for treatment or other  patient management decisions.  A negative result may occur with  improper specimen collection / handling, submission of specimen other  than nasopharyngeal swab, presence of viral mutation(s) within the  areas targeted by this assay, and inadequate number of viral copies  (<250 copies / mL). A negative result must be combined with clinical  observations, patient history, and epidemiological information. If result is POSITIVE SARS-CoV-2 target nucleic acids are DETECTED. The SARS-CoV-2 RNA is generally detectable in upper and lower  respiratory specimens dur ing the acute phase of infection.  Positive  results are indicative of active infection with SARS-CoV-2.  Clinical  correlation with patient history and other diagnostic information is  necessary to determine patient infection status.  Positive results do  not rule out bacterial infection or co-infection with other viruses. If result is PRESUMPTIVE POSTIVE SARS-CoV-2 nucleic acids MAY BE PRESENT.   A presumptive positive result was obtained on the submitted specimen  and confirmed  on repeat testing.  While 2019 novel coronavirus  (SARS-CoV-2) nucleic acids may be present in the submitted sample  additional confirmatory testing may be necessary for epidemiological  and / or clinical management purposes  to differentiate between  SARS-CoV-2 and other Sarbecovirus currently known to infect humans.  If clinically indicated additional testing with an alternate test  methodology [phone]) is advised. The SARS-CoV-2 RNA is generally  detectable in upper and lower respiratory sp ecimens during the acute  phase of infection. The expected result is Negative. Fact Sheet for Patients:  StrictlyIdeas.no Fact Sheet for Healthcare Providers: BankingDealers.co.za This test is not yet approved or cleared by the [place] FDA and has been authorized for detection and/or diagnosis of SARS-CoV-2 by FDA under an Emergency Use Authorization (EUA).  This EUA will remain in effect (meaning this test can be used) for the duration of the COVID-19 declaration under Section  564(b)(1) of the Act, 21 U.S.C. section 360bbb-3(b)(1), unless the authorization is terminated or revoked sooner. Performed at [facility], [place] [address], [place], [place] [address]   Blood Culture (routine x 2)     Status: None   Collection Time: 09/03/18  5:58 PM   Specimen: BLOOD  Result Value Ref Range Status   Specimen Description   Final    BLOOD LEFT ANTECUBITAL Performed at [place] [address], [place], [place] [address]    Special Requests   Final    BOTTLES DRAWN AEROBIC ONLY Blood Culture adequate volume Performed at [place] [address] [place], [place] [address]    Culture   Final    NO GROWTH 5 DAYS Performed at [facility], [place] [address] [place], [place] [address]    Report Status 09/08/2018 FINAL  Final  Blood Culture (routine x 2)     Status: None    Collection Time: 09/03/18  5:58 PM   Specimen: BLOOD  Result Value Ref Range Status   Specimen Description   Final    BLOOD RIGHT ANTECUBITAL Performed at [place] [address] [place], [place] [address]    Special Requests   Final    BOTTLES DRAWN AEROBIC ONLY Blood Culture adequate volume Performed at [place] [address], [place], [place] [address]    Culture   Final    NO GROWTH 5 DAYS Performed at [facility], [place] [address] [place], [place] [address]    Report Status 09/08/2018 FINAL  Final  MRSA PCR Screening     Status: None   Collection Time: 09/03/18 10:14 PM   Specimen: Nasopharyngeal  Result Value Ref Range Status   MRSA by PCR NEGATIVE NEGATIVE Final    Comment:        The GeneXpert MRSA Assay (FDA approved for NASAL specimens only), is one component of a comprehensive MRSA colonization surveillance program. It is not intended to diagnose MRSA infection nor to guide or monitor treatment for MRSA infections. Performed at [facility], [place] [address] [place],  [address]   Respiratory Panel by PCR     Status: None   Collection Time: 09/03/18 10:14 PM   Specimen: Nasopharyngeal Swab; Respiratory  Result Value Ref Range Status   Adenovirus NOT DETECTED NOT DETECTED Final   Coronavirus 229E NOT DETECTED NOT DETECTED Final    Comment: (NOTE) The Coronavirus on the Respiratory Panel, DOES NOT test for the novel  Coronavirus (2019 nCoV)    Coronavirus HKU1 NOT DETECTED NOT DETECTED Final   Coronavirus NL63 NOT DETECTED NOT DETECTED Final   Coronavirus OC43 NOT DETECTED NOT DETECTED Final   Metapneumovirus NOT DETECTED NOT DETECTED Final   Rhinovirus / Enterovirus NOT DETECTED NOT DETECTED Final   Influenza A NOT DETECTED NOT DETECTED Final   Influenza B NOT DETECTED NOT DETECTED Final   Parainfluenza Virus 1 NOT DETECTED NOT DETECTED Final   Parainfluenza Virus 2 NOT  DETECTED NOT DETECTED Final   Parainfluenza Virus 3 NOT DETECTED NOT DETECTED Final   Parainfluenza Virus 4 NOT DETECTED NOT DETECTED Final   Respiratory Syncytial Virus NOT DETECTED NOT DETECTED Final   Bordetella pertussis NOT DETECTED NOT DETECTED Final   Chlamydophila pneumoniae NOT DETECTED NOT DETECTED Final   Mycoplasma pneumoniae NOT DETECTED NOT DETECTED Final    Comment: Performed at [facility], [place]. [address],  [place], [place] [address]  Culture, blood (routine x 2)     Status: None   Collection Time: 09/07/18  7:52 AM   Specimen: BLOOD RIGHT HAND  Result Value Ref Range Status   Specimen Description   Final    BLOOD RIGHT HAND Performed at [place] [address] [place], [place] [address]    Special Requests   Final    BOTTLES DRAWN AEROBIC ONLY Blood Culture adequate volume Performed at [place] [address] [place], [place] [address]    Culture   Final    NO GROWTH 5 DAYS Performed at [facility], [place] [address] [place], [place] [address]    Report Status 09/12/2018 FINAL  Final  Culture, blood (routine x 2)     Status: None   Collection Time: 09/07/18  7:52 AM   Specimen: BLOOD  Result Value Ref Range Status   Specimen Description   Final    BLOOD RIGHT ANTECUBITAL Performed at [place] [address], [place], [place] [address]    Special Requests   Final    BOTTLES DRAWN AEROBIC ONLY Blood Culture results may not be optimal due to an inadequate volume of blood received in culture bottles Performed at [place] [address], [place], [place] [address]    Culture   Final    NO GROWTH 5 DAYS Performed at [facility], [place] [address], [place], [place] [address]    Report Status 09/12/2018 FINAL  Final  Acid Fast Smear (AFB)     Status: None   Collection Time: 09/11/18  9:20 AM   Specimen: Needle Core; Bone Marrow  Result Value Ref Range  Status   AFB Specimen Processing Concentration  Final   Acid Fast Smear Negative  Final    Comment: (NOTE) Performed At: [facility] [place], [place] [id number] Rush Farmer MD [id number]    Source (AFB) BONE MARROW  Final    Comment: Performed at [facility], [place] [address], [place], [place] [address]  Aerobic/Anaerobic Culture (surgical/deep wound)     Status: None (Preliminary result)   Collection Time: 09/11/18  9:20 AM   Specimen: BONE MARROW  Result Value Ref Range Status   Specimen Description   Final    BONE MARROW Performed at [facility] [address] [place], [place] [address]    Special Requests   Final    NONE Performed at [facility], [place] [address] [place], [place] [address]    Gram Stain   Final    FEW WBC PRESENT, PREDOMINANTLY MONONUCLEAR NO ORGANISMS SEEN    Culture   Final    NO GROWTH 2 DAYS NO ANAEROBES ISOLATED; CULTURE IN PROGRESS FOR 5 DAYS Performed at [place] [address] [place], [place] [address]    Report Status PENDING  Incomplete     Labs: Basic Metabolic Panel: Recent Labs  Lab 09/07/18 0127 09/10/18 0238 09/11/18 0435 09/12/18 0412  NA 130* 134* 136 135  K 3.7 4.4 4.0 4.1  CL 104 105 106 109  CO2 17* 21* 22 23  GLUCOSE 91 86 86 89  BUN 28* 21* 14 9  CREATININE 0.74 0.44 0.47 0.40*  CALCIUM 7.3* 7.3* 7.3* 7.3*   Liver Function Tests: Recent Labs  Lab 09/12/18 0412  AST 52*  ALT 24  ALKPHOS 230*  BILITOT 1.4*  PROT  5.6*  ALBUMIN 1.5*   No results for input(s): LIPASE, AMYLASE in the last 168 hours. No results for input(s): AMMONIA in the last 168 hours. CBC: Recent Labs  Lab 09/08/18 2319  09/09/18 1926  09/10/18 1056 09/10/18 1810 09/11/18 0435 09/12/18 0412 09/13/18 0345  WBC 22.3*   < > 16.2*   < > 12.0* 11.1* 9.0 6.7 7.5  NEUTROABS 19.3*  --  13.0*  --  8.6* 7.6 5.8  --   --   HGB 8.4*   < > 10.3*   <  > 8.0* 7.9* 7.6* 7.7* 7.9*  HCT 25.1*   < > 32.2*   < > 24.6* 25.4* 24.3* 24.0* 25.9*  MCV 85.4   < > 91.0   < > 87.5 91.4 90.7 91.6 94.9  PLT 13*   < > 32*   < > 18* 27* 17* 18* 23*   < > = values in this interval not displayed.   Cardiac Enzymes: No results for input(s): CKTOTAL, CKMB, CKMBINDEX, TROPONINI in the last 168 hours. BNP: BNP (last 3 results) Recent Labs    03/08/18 2129 08/02/18 1520  BNP 8.8 144.0*    ProBNP (last 3 results) No results for input(s): PROBNP in the last 8760 hours.  CBG: Recent Labs  Lab 09/07/18 0756 09/08/18 0750 09/09/18 0743 09/10/18 0747  GLUCAP 74 72 83 72       Signed:  Oswald Hillock MD.  [facility] 09/13/2018, 12:20 PM

## 2018-09-13 NOTE — Progress Notes (Signed)
Patient discharging home after completion of blood transfusion.  Reviewed AVS and medications.  Instructed to call Monday AM for appointment to get lab work per MD orders.  Verbalizes understanding.  No questions at this time and patient is in NAD.  Will make oncoming RN aware, IV will need to be discontinued prior to DC

## 2018-09-13 NOTE — Plan of Care (Signed)
Pt's husband brought food and pt ate well.

## 2018-09-14 LAB — TYPE AND SCREEN
ABO/RH(D): O POS
Antibody Screen: NEGATIVE
Unit division: 0

## 2018-09-14 LAB — BPAM RBC
Blood Product Expiration Date: 202009022359
ISSUE DATE / TIME: 202008071711
Unit Type and Rh: 5100

## 2018-09-16 ENCOUNTER — Other Ambulatory Visit: Payer: Self-pay | Admitting: *Deleted

## 2018-09-16 ENCOUNTER — Telehealth: Payer: Self-pay

## 2018-09-16 ENCOUNTER — Other Ambulatory Visit: Payer: Self-pay

## 2018-09-16 ENCOUNTER — Ambulatory Visit (INDEPENDENT_AMBULATORY_CARE_PROVIDER_SITE_OTHER): Payer: 59 | Admitting: *Deleted

## 2018-09-16 VITALS — BP 122/72 | HR 118 | Temp 98.0°F

## 2018-09-16 DIAGNOSIS — Z3042 Encounter for surveillance of injectable contraceptive: Secondary | ICD-10-CM

## 2018-09-16 LAB — AEROBIC/ANAEROBIC CULTURE W GRAM STAIN (SURGICAL/DEEP WOUND): Culture: NO GROWTH

## 2018-09-16 MED ORDER — MEDROXYPROGESTERONE ACETATE 150 MG/ML IM SUSP
150.0000 mg | Freq: Once | INTRAMUSCULAR | Status: AC
  Administered 2018-09-16: 16:00:00 150 mg via INTRAMUSCULAR

## 2018-09-16 NOTE — Patient Outreach (Signed)
[facility]) Care Management  09/16/2018  Anastasija Cockburn Jan 31, 1986 [id number]  Transition of care telephone call  Referral received: 09/16/18 Initial outreach: 09/16/18 Insurance: [employer] [employer] Plan  Subjective: Reached Evanee at her mobile number, 2 HIPAA identifiers verified. Explained purpose of call. Bryla requested a call back later today, states she does not feel up to talking at present.  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  During her hospitalization she received 7 units of platelets and 8 units of PRBCs. S he also received empiric vancomycin, cefepime, and clindamycin for suspected pneumocystis penumonia . She was seen by the following specialists: infectious disease, oncology and pulmonology.   Comorbidities include:HIV/AIDS, anemia, hilar adenopathy, molluscum contagiosum and thrombocytopenia  She was discharged to home on 09/13/18 without the need for home health services or durable medical equipment per the discharge summary.   Plan: Per Harleen's request, will attempt another outreach later today in order to complete transition of care assessment. Barrington Ellison RN,CCM,CDE [place] Management Coordinator Office Phone [phone] Office Fax [phone]

## 2018-09-16 NOTE — Telephone Encounter (Signed)
Received faxed forms from [employer]. Copy of form placed in triage and original given to provider. Eugenia Mcalpine, LPN

## 2018-09-16 NOTE — Progress Notes (Signed)
   Subjective:  Pt in for Depo Provera injection.  Pt recently discharged from hospital.  Objective: Need for contraception. No unusual complaints.  Weight not assessed due severe swelling of feet.  Assessment: Pt tolerated Depo injection. Depo given right upper outer quadrant.   Plan:  Next injection due 12/02/2018-12/16/2018.    Derl Barrow, RN

## 2018-09-16 NOTE — Telephone Encounter (Signed)
Per Terri Piedra, FNP called [employer] to refax patient's leave of absence form due to receiving partial form. Left voicemail with Lolita Patella to refax form to our office.  P: [phone] ext [phone] F: [phone]  Will keep partial copy in FNP box.  [place]

## 2018-09-17 ENCOUNTER — Other Ambulatory Visit: Payer: Self-pay | Admitting: *Deleted

## 2018-09-17 ENCOUNTER — Encounter: Payer: Self-pay | Admitting: *Deleted

## 2018-09-17 NOTE — Patient Outreach (Signed)
[place] [facility]) Care Management  09/17/2018  Krystal Short March 25, 1985 [id number]  Transition of care call/case closure   Referral received: 09/16/18 Initial outreach:09/16/18 Insurance: [place] [employer]   Subjective: Successful telephone call to patient's preferred number in order to complete transition of care assessment; 2 HIPAA identifiers verified. Explained purpose of call and completed transition of care assessment.  Krystal Short states she is feeling better than yesterday.  Says she is tolerating diet, and denies bowel or bladder problems.  Spouse and her brother are assisting with his/her recovery.  She says she does/does not have the hospital indemnity.  She says she uses a [facility] outpatient pharmacy, [facility].  She denies educational needs related to staying safe during the COVID 19 pandemic.    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  During her hospitalization she received 7 units of platelets and 8 units of PRBCs. She also received empiric vancomycin, cefepime, and clindamycin for suspected pneumocystis pneumonia . She was seen by the following specialists: infectious disease, oncology and pulmonology.   Comorbidities include:HIV/AIDS- initially diagnosed in April 2019, anemia, hilar adenopathy, molluscum contagiosum and thrombocytopenia  She was discharged to home on 09/13/18 without the need for home health services or durable medical equipment per the discharge summary.   Assessment:  Patient voices good understanding of all discharge instructions.  See transition of care flowsheet for assessment details.   Plan:  Reviewed hospital discharge diagnosis sepsis due to pneumonia and anemia  and treatment plan using hospital discharge instructions, assessing  medication adherence, reviewing problems requiring provider notification, and discussing the importance of follow up with primary care provider specialists as directed. No ongoing care management needs identified so will close case to [place] [facility] services. Per patient request a successful outreach letter will not be mailed to her home address since she received one in April and one in June. She verifies that she has this RNCM's contact information should future care management needs arise.   Barrington Ellison RN,CCM,CDE [place] Management Coordinator Office Phone [phone] Office Fax [phone]

## 2018-09-18 ENCOUNTER — Encounter: Payer: Self-pay | Admitting: Family

## 2018-09-18 ENCOUNTER — Encounter (HOSPITAL_COMMUNITY): Payer: Self-pay | Admitting: Hematology

## 2018-09-18 ENCOUNTER — Other Ambulatory Visit: Payer: Self-pay

## 2018-09-18 ENCOUNTER — Ambulatory Visit (INDEPENDENT_AMBULATORY_CARE_PROVIDER_SITE_OTHER): Payer: 59 | Admitting: Family

## 2018-09-18 VITALS — BP 107/76 | HR 111 | Temp 98.3°F

## 2018-09-18 DIAGNOSIS — D649 Anemia, unspecified: Secondary | ICD-10-CM | POA: Diagnosis not present

## 2018-09-18 DIAGNOSIS — D696 Thrombocytopenia, unspecified: Secondary | ICD-10-CM

## 2018-09-18 DIAGNOSIS — B20 Human immunodeficiency virus [HIV] disease: Secondary | ICD-10-CM | POA: Diagnosis not present

## 2018-09-18 DIAGNOSIS — Z789 Other specified health status: Secondary | ICD-10-CM

## 2018-09-18 DIAGNOSIS — R5381 Other malaise: Secondary | ICD-10-CM | POA: Diagnosis not present

## 2018-09-18 MED ORDER — BIKTARVY 50-200-25 MG PO TABS: 1.0000 | ORAL_TABLET | Freq: Every day | ORAL | 5 refills | Status: AC

## 2018-09-18 MED ORDER — ATOVAQUONE 750 MG/5ML PO SUSP: 1500.0000 mg | Freq: Every day | ORAL | 0 refills | Status: DC

## 2018-09-18 NOTE — Assessment & Plan Note (Signed)
Krystal Short has well controlled AIDS with her ART regimen of Biktarvy and remains virally suppressed. She is immunocompromised with most recent CD4 count of 41 which has not progressed since her initial diagnosis. Not currently having any symptoms of opportunistic infection and I do not think that she has PCP at this time. She will continue to take her Biktarvy as prescribed supplemented with Mepron and Azithromycin. Will continue to monitor AFB cultures from bone marrow which may take up to 6 to result. Will complete FMLA paperwork. Plan for follow up in 1 month or sooner if needed.

## 2018-09-18 NOTE — Patient Instructions (Signed)
Nice to see you.  Please continue to take your Biktarvy, Mepron and Azithromycin.  We will continue to monitor your cultures and it may take up to 6 weeks to get some of the culture results.  I will complete your paperwork for extension until October 1st for now.  Continue to follow up with Dr. Irene Limbo.  Plan for follow up in 1 month or sooner if needed.

## 2018-09-18 NOTE — Assessment & Plan Note (Signed)
Krystal Short appears improved today with no shortness of breath today but does have continued fatigue and fatigues easily with increasing activity. There does not appear to be any infectious cause of her symptoms at present. Awaiting follow up blood work and treatment with Hematology tomorrow. No evidence of bleeding currently. Continue to monitor.

## 2018-09-18 NOTE — Progress Notes (Signed)
Subjective:    Patient ID: Krystal Short, female    DOB: 04/08/1985, 33 y.o.   MRN: [id number]  Chief Complaint  Patient presents with  . HIV Positive/AIDS  . Symptomatic Anemia     HPI:  Krystal Short is a 33 y.o. female with AIDS who was last seen in the office on 07/22/18 following recent hospitalization for acute respiratory failure with hypoxia and symptomatic anemia. At the time she was improving since leaving the hospital however relapsed resulting in hospitalization from 09/03/18-09/13/18 presenting with generalized weakness, fatigue, hypotension and tachycardia. During hospitalization she was treated for suspected pneumocysitis pneumonia and symptomatic anemia requiring 7 units of platlets and 8 units of red blood cells. She completed IVIG treatment x 2. Her prednisone is being weaned and underwent bone marrow biopsy to include AFB testing. Hematology followed and is following up for platlet transfusion and Nplate. Her work up for atypical pneumonia was unrevealing with negative fungal pathology and bronchoscopy on 6/5 with scan lung parenchyma with fibrosis and reactive changes. Initially started on primaquine and clindamycin which she completed while inpatient. Dr. Prince [place] recommended open lung biopsy. Pulmonology following up as outpatient and will continue to wean prednisone.   From HIV/AIDS perspective most recent CD4 count was 41 and she was continued on her Biktarvy supplemented by Mepron and Azithromycin for prophylaxis.   [name] has been feeling better since leaving the hospital. She is taking her Biktarvy, Mepron, and Azithromycin as prescribed with no adverse side effects or missed doses. Her shortness of breath has improved but she continues to be deconditioned and easily fatigueable requiring assistance in activities of daily living including showing and preparing meals. Functionally she needs assistance going up and down stairs and it takes increased amount of time for  her to go from sitting to standing and vice versa. No fevers, chills or sweats recently. She has a follow up appointment with Dr. Irene Limbo tomorrow. Pulmonology awaiting Hematology visit for the potential of open lung biopsy.    Allergies  Allergen Reactions  . Heparin Other (See Comments)    Unknown reaction      Outpatient Medications Prior to Visit  Medication Sig Dispense Refill  . acetaminophen (TYLENOL) 325 MG tablet Take 650 mg by mouth every 6 (six) hours as needed for mild pain or headache.    Marland Kitchen azithromycin (ZITHROMAX) 600 MG tablet Take 2 tablets (1,200 mg total) by mouth once a week. 10 tablet 0  . B Complex-C (B-COMPLEX WITH VITAMIN C) tablet Take 1 tablet by mouth daily. 30 tablet 0  . folic acid (FOLVITE) 1 MG tablet Take 2 tablets (2 mg total) by mouth daily. 60 tablet 0  . metoprolol tartrate (LOPRESSOR) 25 MG tablet Take 0.5 tablets (12.5 mg total) by mouth 2 (two) times daily. 60 tablet 2  . predniSONE (DELTASONE) 10 MG tablet Prednisone 10 mg daily x 14 days then  Prednisone 5 mg po daily x 7 days then stop 21 tablet 0  . atovaquone (MEPRON) 750 MG/5ML suspension Take 10 mLs (1,500 mg total) by mouth daily. 300 mL 0  . bictegravir-emtricitabine-tenofovir AF (BIKTARVY) 50-200-25 MG TABS tablet Take 1 tablet by mouth daily. 30 tablet 3  . ondansetron (ZOFRAN) 4 MG tablet Take 1 tablet (4 mg total) by mouth every 6 (six) hours. (Patient not taking: Reported on 09/18/2018) 12 tablet 0  . polyethylene glycol (MIRALAX / GLYCOLAX) 17 g packet Take 17 g by mouth daily as needed for mild constipation. (Patient not taking:  Reported on 09/18/2018) 14 each 0   No facility-administered medications prior to visit.      Past Medical History:  Diagnosis Date  . Acute hyponatremia 06/03/2017  . Anemia   . CAP (community acquired pneumonia) 06/03/2017  . Facial dermatitis 06/03/2017  . HIV (human immunodeficiency virus infection) ([place])    diagnosed in April 2019  . Pleurisy 06/03/2017   . 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 ([place])   . Thrush of mouth and esophagus ([place])   . UTI (urinary tract infection)      Past Surgical History:  Procedure Laterality Date  . BIOPSY  07/12/2018   Procedure: BIOPSY;  Surgeon: Laurin Coder, MD;  Location: [facility];  Service: Endoscopy;;  . BRONCHIAL WASHINGS  07/12/2018   Procedure: BRONCHIAL WASHINGS;  Surgeon: Laurin Coder, MD;  Location: [facility];  Service: Endoscopy;;  . ENDOBRONCHIAL ULTRASOUND N/A 07/12/2018   Procedure: ENDOBRONCHIAL ULTRASOUND;  Surgeon: Laurin Coder, MD;  Location: [facility];  Service: Endoscopy;  Laterality: N/A;  . FINE NEEDLE ASPIRATION BIOPSY  07/12/2018   Procedure: FINE NEEDLE ASPIRATION BIOPSY;  Surgeon: Laurin Coder, MD;  Location: [facility];  Service: Endoscopy;;  . NO PAST SURGERIES    . VIDEO BRONCHOSCOPY N/A 07/12/2018   Procedure: VIDEO BRONCHOSCOPY WITHOUT FLUORO;  Surgeon: Laurin Coder, MD;  Location: [facility];  Service: Endoscopy;  Laterality: N/A;       Review of Systems  Constitutional: Positive for fatigue. Negative for appetite change, chills, diaphoresis, fever and unexpected weight change.  Eyes:       Negative for acute change in vision  Respiratory: Negative for chest tightness, shortness of breath and wheezing.   Cardiovascular: Negative for chest pain.  Gastrointestinal: Negative for diarrhea, nausea and vomiting.  Musculoskeletal: Negative for neck pain and neck stiffness.  Skin: Negative for rash.  Neurological: Negative for seizures, syncope, weakness and headaches.  Hematological: Negative for adenopathy. Does not bruise/bleed easily.  Psychiatric/Behavioral: Negative for hallucinations.      Objective:    BP 107/76   Pulse (!) 111   Temp 98.3 F (36.8 C) (Oral)  Nursing note and vital signs reviewed.  Physical Exam Constitutional:      General: She is not in acute distress.    Appearance: She is  well-developed. She is ill-appearing.  Eyes:     Conjunctiva/sclera: Conjunctivae normal.  Neck:     Musculoskeletal: Neck supple.  Cardiovascular:     Rate and Rhythm: Normal rate and regular rhythm.     Heart sounds: Normal heart sounds. No murmur. No friction rub. No gallop.   Pulmonary:     Effort: Pulmonary effort is normal. No respiratory distress.     Breath sounds: Normal breath sounds. No wheezing or rales.  Chest:     Chest wall: No tenderness.  Abdominal:     General: Bowel sounds are normal.     Palpations: Abdomen is soft.     Tenderness: There is no abdominal tenderness.  Lymphadenopathy:     Cervical: No cervical adenopathy.  Skin:    General: Skin is warm and dry.     Findings: No rash.  Neurological:     Mental Status: She is alert and oriented to person, place, and time.  Psychiatric:        Behavior: Behavior normal.        Thought Content: Thought content normal.        Judgment: Judgment normal.  Depression screen [facility] 2/9 06/20/2018 06/05/2018 02/25/2018 02/25/2018 12/31/2017  Decreased Interest 0 0 1 0 0  Down, Depressed, Hopeless 0 0 0 0 0  PHQ - 2 Score 0 0 1 0 0  Altered sleeping - - 0 0 -  Tired, decreased energy - - 3 0 -  Change in appetite - - 2 0 -  Feeling bad or failure about yourself  - - 0 0 -  Trouble concentrating - - 0 0 -  Moving slowly or fidgety/restless - - 0 0 -  Suicidal thoughts - - 0 0 -  PHQ-9 Score - - 6 0 -       Assessment & Plan:   Problem List Items Addressed This Visit      Other   AIDS (acquired immune deficiency syndrome) ([place]) - Primary    Ms. Eye has well controlled AIDS with her ART regimen of Biktarvy and remains virally suppressed. She is immunocompromised with most recent CD4 count of 41 which has not progressed since her initial diagnosis. Not currently having any symptoms of opportunistic infection and I do not think that she has PCP at this time. She will continue to take her Biktarvy as prescribed  supplemented with Mepron and Azithromycin. Will continue to monitor AFB cultures from bone marrow which may take up to 6 to result. Will complete FMLA paperwork. Plan for follow up in 1 month or sooner if needed.       Relevant Medications   bictegravir-emtricitabine-tenofovir AF (BIKTARVY) 50-200-25 MG TABS tablet   atovaquone (MEPRON) 750 MG/5ML suspension   Symptomatic anemia    Ms. Gudino appears improved today with no shortness of breath today but does have continued fatigue and fatigues easily with increasing activity. There does not appear to be any infectious cause of her symptoms at present. Awaiting follow up blood work and treatment with Hematology tomorrow. No evidence of bleeding currently. Continue to monitor.       Thrombocytopenia ([place])   Alteration in performance of activities of daily living    Ms. Mourer has decreased ability to perform her activities of daily living and not currently able to work at the present time due to her physical restrictions and likely related to symptomatic anemia. Will complete FMLA paperwork extending leave through October 1st and will re-evaluate in 1 month or sooner if needed.        Other Visit Diagnoses    AIDS ([place])       Relevant Medications   bictegravir-emtricitabine-tenofovir AF (BIKTARVY) 50-200-25 MG TABS tablet   atovaquone (MEPRON) 750 MG/5ML suspension       I am having Kirstine [place] maintain her acetaminophen, folic acid, B-complex with vitamin C, polyethylene glycol, ondansetron, azithromycin, predniSONE, metoprolol tartrate, Biktarvy, and atovaquone.   Meds ordered this encounter  Medications  . bictegravir-emtricitabine-tenofovir AF (BIKTARVY) 50-200-25 MG TABS tablet    Sig: Take 1 tablet by mouth daily.    Dispense:  30 tablet    Refill:  5    Order Specific Question:   Supervising Provider    Answer:   Carlyle Basques [[id number]]  . atovaquone (MEPRON) 750 MG/5ML suspension    Sig: Take 10 mLs (1,500 mg total) by  mouth daily.    Dispense:  300 mL    Refill:  0    Order Specific Question:   Supervising Provider    Answer:   Carlyle Basques [[id number]]     Follow-up: Return in about 1 month (around 10/19/2018).   Marya Amsler  Elna Breslow, MSN, FNP-C Nurse Practitioner [facility] for [place] number: [phone]

## 2018-09-18 NOTE — Assessment & Plan Note (Signed)
Krystal Short has decreased ability to perform her activities of daily living and not currently able to work at the present time due to her physical restrictions and likely related to symptomatic anemia. Will complete FMLA paperwork extending leave through October 1st and will re-evaluate in 1 month or sooner if needed.

## 2018-09-19 ENCOUNTER — Inpatient Hospital Stay: Payer: 59 | Admitting: Hematology

## 2018-09-19 ENCOUNTER — Inpatient Hospital Stay: Payer: 59

## 2018-09-19 ENCOUNTER — Other Ambulatory Visit: Payer: Self-pay | Admitting: *Deleted

## 2018-09-19 ENCOUNTER — Emergency Department (HOSPITAL_COMMUNITY): Payer: 59

## 2018-09-19 ENCOUNTER — Other Ambulatory Visit: Payer: Self-pay

## 2018-09-19 ENCOUNTER — Emergency Department (HOSPITAL_COMMUNITY)
Admission: EM | Admit: 2018-09-19 | Discharge: 2018-09-19 | Disposition: A | Discharge status: Home / Self Care | Payer: 59 | Attending: Emergency Medicine | Admitting: Emergency Medicine

## 2018-09-19 ENCOUNTER — Inpatient Hospital Stay: Payer: 59 | Attending: Hematology

## 2018-09-19 DIAGNOSIS — R5383 Other fatigue: Secondary | ICD-10-CM | POA: Insufficient documentation

## 2018-09-19 DIAGNOSIS — D649 Anemia, unspecified: Secondary | ICD-10-CM | POA: Insufficient documentation

## 2018-09-19 DIAGNOSIS — J189 Pneumonia, unspecified organism: Secondary | ICD-10-CM | POA: Diagnosis not present

## 2018-09-19 DIAGNOSIS — D696 Thrombocytopenia, unspecified: Secondary | ICD-10-CM | POA: Insufficient documentation

## 2018-09-19 DIAGNOSIS — Z79899 Other long term (current) drug therapy: Secondary | ICD-10-CM | POA: Insufficient documentation

## 2018-09-19 DIAGNOSIS — Z21 Asymptomatic human immunodeficiency virus [HIV] infection status: Secondary | ICD-10-CM | POA: Diagnosis not present

## 2018-09-19 DIAGNOSIS — R918 Other nonspecific abnormal finding of lung field: Secondary | ICD-10-CM | POA: Diagnosis not present

## 2018-09-19 DIAGNOSIS — Z87891 Personal history of nicotine dependence: Secondary | ICD-10-CM | POA: Diagnosis not present

## 2018-09-19 DIAGNOSIS — D61818 Other pancytopenia: Secondary | ICD-10-CM

## 2018-09-19 DIAGNOSIS — R42 Dizziness and giddiness: Secondary | ICD-10-CM | POA: Diagnosis not present

## 2018-09-19 DIAGNOSIS — D72829 Elevated white blood cell count, unspecified: Secondary | ICD-10-CM | POA: Insufficient documentation

## 2018-09-19 DIAGNOSIS — D761 Hemophagocytic lymphohistiocytosis: Secondary | ICD-10-CM

## 2018-09-19 DIAGNOSIS — R000 Tachycardia, unspecified: Secondary | ICD-10-CM | POA: Diagnosis not present

## 2018-09-19 LAB — CBC WITH DIFFERENTIAL (CANCER CENTER ONLY)
Abs Immature Granulocytes: 0.07 10*3/uL (ref 0.00–0.07)
Basophils Absolute: 0 10*3/uL (ref 0.0–0.1)
Basophils Relative: 1 %
Eosinophils Absolute: 0 10*3/uL (ref 0.0–0.5)
Eosinophils Relative: 0 %
HCT: 29.4 % — ABNORMAL LOW (ref 36.0–46.0)
Hemoglobin: 9.4 g/dL — ABNORMAL LOW (ref 12.0–15.0)
Immature Granulocytes: 2 %
Lymphocytes Relative: 22 %
Lymphs Abs: 1 10*3/uL (ref 0.7–4.0)
MCH: 29.9 pg (ref 26.0–34.0)
MCHC: 32 g/dL (ref 30.0–36.0)
MCV: 93.6 fL (ref 80.0–100.0)
Monocytes Absolute: 0.5 10*3/uL (ref 0.1–1.0)
Monocytes Relative: 11 %
Neutro Abs: 3.1 10*3/uL (ref 1.7–7.7)
Neutrophils Relative %: 64 %
Platelet Count: 74 10*3/uL — ABNORMAL LOW (ref 150–400)
RBC: 3.14 MIL/uL — ABNORMAL LOW (ref 3.87–5.11)
RDW: 21.3 % — ABNORMAL HIGH (ref 11.5–15.5)
WBC Count: 4.8 10*3/uL (ref 4.0–10.5)
nRBC: 0.4 % — ABNORMAL HIGH (ref 0.0–0.2)

## 2018-09-19 LAB — CMP (CANCER CENTER ONLY)
ALT: 20 U/L (ref 0–44)
AST: 24 U/L (ref 15–41)
Albumin: 2.6 g/dL — ABNORMAL LOW (ref 3.5–5.0)
Alkaline Phosphatase: 164 U/L — ABNORMAL HIGH (ref 38–126)
Anion gap: 12 (ref 5–15)
BUN: 10 mg/dL (ref 6–20)
CO2: 18 mmol/L — ABNORMAL LOW (ref 22–32)
Calcium: 8.6 mg/dL — ABNORMAL LOW (ref 8.9–10.3)
Chloride: 104 mmol/L (ref 98–111)
Creatinine: 0.74 mg/dL (ref 0.44–1.00)
GFR, Est AFR Am: >60 mL/min (ref >60)
GFR, Estimated: >60 mL/min (ref >60)
Glucose, Bld: 150 mg/dL — ABNORMAL HIGH (ref 70–99)
Potassium: 3.2 mmol/L — ABNORMAL LOW (ref 3.5–5.1)
Sodium: 134 mmol/L — ABNORMAL LOW (ref 135–145)
Total Bilirubin: 1.5 mg/dL — ABNORMAL HIGH (ref 0.3–1.2)
Total Protein: 8.1 g/dL (ref 6.5–8.1)

## 2018-09-19 LAB — LACTIC ACID, PLASMA
Lactic Acid, Venous: 2.2 mmol/L (ref 0.5–1.9)
Lactic Acid, Venous: 1.8 mmol/L (ref 0.5–1.9)

## 2018-09-19 LAB — URINALYSIS, ROUTINE W REFLEX MICROSCOPIC
Bilirubin Urine: NEGATIVE
Glucose, UA: NEGATIVE mg/dL
Hgb urine dipstick: NEGATIVE
Ketones, ur: NEGATIVE mg/dL
Leukocytes,Ua: NEGATIVE
Nitrite: NEGATIVE
Protein, ur: NEGATIVE mg/dL
Specific Gravity, Urine: 1.004 — ABNORMAL LOW (ref 1.005–1.030)
pH: 7 (ref 5.0–8.0)

## 2018-09-19 LAB — I-STAT BETA HCG BLOOD, ED (MC, WL, AP ONLY): I-stat hCG, quantitative: <5 m[IU]/mL (ref <5)

## 2018-09-19 LAB — SAMPLE TO BLOOD BANK

## 2018-09-19 MED ORDER — SODIUM CHLORIDE 0.9 % IV BOLUS
1000.0000 mL | Freq: Once | INTRAVENOUS | Status: AC
  Administered 2018-09-19: 1000 mL via INTRAVENOUS

## 2018-09-19 NOTE — Progress Notes (Signed)
Patient felt faint and lightheaded while in [facility] lab having blood drawn. Lab called - patient brought to [facility] MD side in w/c. Patient stated felt ok this morning, showered and had breakfast. Stated she did not feel bad until having blood drawn. VS obtained, see flow sheet. Hypotensive. Afebrile. Dr. Irene Limbo requested patient be taken to ED for evaluation. RN transported patient to [facility] ED in w/c. Report given to [facility] in ED.

## 2018-09-19 NOTE — ED Triage Notes (Signed)
Transported from the [place] [facility] for hypotension. Initial BP read 56/37 and repeat read 63/42. Patient asymptomatic at this time. Hx of pancytopenia. Staff was drawing blood when hypotension/dizziness started.

## 2018-09-19 NOTE — Discharge Instructions (Signed)
Your history and exam today were not completely consistent with action and your diagnostic work-up suggested possible infection.  Given your lack of any cough, congestion, fevers, chills, chest pain, shortness of breath, we have a lower suspicion for pneumonia as the chest x-ray was unchanged from prior.  Your labs were reassuring and your lactic acid improved after fluids.  I suspect you are slightly dehydrated and after seeing the blood draw, became hypotensive.  You have not been hypotensive with [place] in the emergency department and after our shared decision-making conversation, we agreed with discharge.  If you develop any new or worsening symptoms, please return immediately to nearest emergency department.  Please follow-up with your PCP.

## 2018-09-19 NOTE — ED Provider Notes (Signed)
[facility] DEPT Provider Note   CSN: [id number] Arrival date & time: 09/19/18  1158     History   Chief Complaint Chief Complaint  Patient presents with  . Hypotension    HPI Krystal Short is a 33 y.o. female.     The history is provided by the patient and medical records. No language interpreter was used.  Dizziness Quality:  Lightheadedness Severity:  Moderate Onset quality:  Sudden Duration:  1 hour Timing:  Rare Progression:  Resolved Chronicity:  Recurrent Context comment:  Blood draw Relieved by:  Nothing Worsened by:  Nothing Ineffective treatments:  None tried Associated symptoms: no blood in stool, no chest pain, no diarrhea, no headaches, no nausea, no palpitations, no shortness of breath, no syncope, no vision changes, no vomiting and no weakness   Risk factors: anemia     Past Medical History:  Diagnosis Date  . Acute hyponatremia 06/03/2017  . Anemia   . CAP (community acquired pneumonia) 06/03/2017  . Facial dermatitis 06/03/2017  . HIV (human immunodeficiency virus infection) ([place])    diagnosed in April 2019  . Pleurisy 06/03/2017  . 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 ([place])   . Thrush of mouth and esophagus ([place])   . UTI (urinary tract infection)     Patient Active Problem List   Diagnosis Date Noted  . Alteration in performance of activities of daily living 09/18/2018  . Anemia   . Sepsis due to pneumonia ([place]) 09/03/2018  . Pancytopenia, acquired ([place])   . Elevated bilirubin   . AKI (acute kidney injury) ([place])   . Acute respiratory failure with hypoxemia ([place]) 08/03/2018  . Chest pain   . Fever 08/02/2018  . HCAP (healthcare-associated pneumonia) 08/02/2018  . Sepsis ([place]) 08/02/2018  . Hemophagocytosis present in bone marrow ([place])   . Pulmonary infiltrates 07/08/2018  . Hilar adenopathy 07/08/2018  . Acute respiratory failure with hypoxia ([place]) 07/04/2018  . Symptomatic anemia  06/10/2018  . Thrombocytopenia ([place]) 06/10/2018  . ASCUS with positive high risk HPV cervical 06/10/2018  . Suspected pneumocystis pneumonia ([place]) 03/08/2018  . Medication monitoring encounter 12/31/2017  . Molluscum contagiosum 06/27/2017  . AIDS (acquired immune deficiency syndrome) ([place])   . Esophageal candidiasis ([place])   . Hyponatremia 06/03/2017    Past Surgical History:  Procedure Laterality Date  . BIOPSY  07/12/2018   Procedure: BIOPSY;  Surgeon: Laurin Coder, MD;  Location: [facility];  Service: Endoscopy;;  . BRONCHIAL WASHINGS  07/12/2018   Procedure: BRONCHIAL WASHINGS;  Surgeon: Laurin Coder, MD;  Location: [facility];  Service: Endoscopy;;  . ENDOBRONCHIAL ULTRASOUND N/A 07/12/2018   Procedure: ENDOBRONCHIAL ULTRASOUND;  Surgeon: Laurin Coder, MD;  Location: [facility];  Service: Endoscopy;  Laterality: N/A;  . FINE NEEDLE ASPIRATION BIOPSY  07/12/2018   Procedure: FINE NEEDLE ASPIRATION BIOPSY;  Surgeon: Laurin Coder, MD;  Location: [facility];  Service: Endoscopy;;  . NO PAST SURGERIES    . VIDEO BRONCHOSCOPY N/A 07/12/2018   Procedure: VIDEO BRONCHOSCOPY WITHOUT FLUORO;  Surgeon: Laurin Coder, MD;  Location: [facility];  Service: Endoscopy;  Laterality: N/A;     OB History    Gravida  0   Para  0   Term  0   Preterm  0   AB  0   Living  0     SAB  0   TAB  0   Ectopic  0   Multiple  0  Live Births  0            Home Medications    Prior to Admission medications   Medication Sig Start Date End Date Taking? Authorizing Provider  acetaminophen (TYLENOL) 325 MG tablet Take 650 mg by mouth every 6 (six) hours as needed for mild pain or headache.    [provider]  atovaquone (MEPRON) 750 MG/5ML suspension Take 10 mLs (1,500 mg total) by mouth daily. 09/18/18 10/18/18  Golden Circle, FNP  azithromycin (ZITHROMAX) 600 MG tablet Take 2 tablets (1,200 mg total) by mouth once a week. 09/17/18   Oswald Hillock, MD  B Complex-C (B-COMPLEX WITH VITAMIN C) tablet Take 1 tablet by mouth daily. 08/23/18 09/22/18  Elodia Florence., MD  bictegravir-emtricitabine-tenofovir AF (BIKTARVY) 50-200-25 MG TABS tablet Take 1 tablet by mouth daily. 09/18/18   Golden Circle, FNP  folic acid (FOLVITE) 1 MG tablet Take 2 tablets (2 mg total) by mouth daily. 08/23/18 09/22/18  Elodia Florence., MD  metoprolol tartrate (LOPRESSOR) 25 MG tablet Take 0.5 tablets (12.5 mg total) by mouth 2 (two) times daily. 09/13/18   Oswald Hillock, MD  ondansetron (ZOFRAN) 4 MG tablet Take 1 tablet (4 mg total) by mouth every 6 (six) hours. Patient not taking: Reported on 09/18/2018 09/13/18   Oswald Hillock, MD  polyethylene glycol (MIRALAX / GLYCOLAX) 17 g packet Take 17 g by mouth daily as needed for mild constipation. Patient not taking: Reported on 09/18/2018 09/13/18   Oswald Hillock, MD  predniSONE (DELTASONE) 10 MG tablet Prednisone 10 mg daily x 14 days then  Prednisone 5 mg po daily x 7 days then stop 09/13/18   Oswald Hillock, MD    Family History Family History  Problem Relation Age of Onset  . Healthy Father   . Healthy Mother   . Asthma Paternal Grandmother     Social History Social History   Tobacco Use  . Smoking status: Former Smoker    Packs/day: 0.25    Years: 8.00    Pack years: 2.00    Types: Cigarettes, Cigars    Quit date: 12/25/2016    Years since quitting: 1.7  . Smokeless tobacco: Never Used  Substance Use Topics  . Alcohol use: Yes    Comment: weekend/socially  . Drug use: No     Allergies   Heparin   Review of Systems Review of Systems  Constitutional: Positive for fatigue. Negative for chills, diaphoresis and fever.  HENT: Negative for congestion.   Eyes: Negative for visual disturbance.  Respiratory: Negative for apnea, cough, shortness of breath, wheezing and stridor.   Cardiovascular: Negative for chest pain, palpitations and syncope.  Gastrointestinal: Negative for blood in  stool, diarrhea, nausea and vomiting.  Genitourinary: Negative for dysuria and flank pain.  Musculoskeletal: Negative for back pain, neck pain and neck stiffness.  Skin: Negative for rash and wound.  Neurological: Positive for light-headedness. Negative for dizziness, syncope, facial asymmetry, weakness, numbness and headaches.  Psychiatric/Behavioral: Negative for agitation.  All other systems reviewed and are negative.    Physical Exam Updated Vital Signs BP 109/63   Pulse 87   Temp (!) 97.5 F (36.4 C) (Oral)   Resp 16   Ht 5' 1.5" (1.562 m)   Wt 52.2 kg   SpO2 100%   BMI 21.38 kg/m   Physical Exam Vitals signs and nursing note reviewed.  Constitutional:      General: She is  not in acute distress.    Appearance: She is well-developed. She is not ill-appearing, toxic-appearing or diaphoretic.  HENT:     Head: Normocephalic and atraumatic.     Nose: No congestion or rhinorrhea.     Mouth/Throat:     Pharynx: No oropharyngeal exudate or posterior oropharyngeal erythema.  Eyes:     Conjunctiva/sclera: Conjunctivae normal.     Pupils: Pupils are equal, round, and reactive to light.  Neck:     Musculoskeletal: Neck supple. No muscular tenderness.  Cardiovascular:     Rate and Rhythm: Normal rate and regular rhythm.     Heart sounds: No murmur.  Pulmonary:     Effort: Pulmonary effort is normal. No respiratory distress.     Breath sounds: No stridor. Rhonchi present. No wheezing or rales.  Chest:     Chest wall: No tenderness.  Abdominal:     Palpations: Abdomen is soft.     Tenderness: There is no abdominal tenderness. There is no right CVA tenderness or left CVA tenderness.  Musculoskeletal:        General: No tenderness.     Right lower leg: No edema.     Left lower leg: No edema.  Skin:    General: Skin is warm and dry.     Capillary Refill: Capillary refill takes less than 2 seconds.     Findings: No erythema.  Neurological:     General: No focal deficit  present.     Mental Status: She is alert and oriented to person, place, and time.     Sensory: No sensory deficit.     Motor: No weakness.  Psychiatric:        Mood and Affect: Mood normal.      ED Treatments / Results  Labs (all labs ordered are listed, but only abnormal results are displayed) Labs Reviewed  URINALYSIS, ROUTINE W REFLEX MICROSCOPIC - Abnormal; Notable for the following components:      Result Value   Specific Gravity, Urine 1.004 (*)    All other components within normal limits  LACTIC ACID, PLASMA - Abnormal; Notable for the following components:   Lactic Acid, Venous 2.2 (*)    All other components within normal limits  CULTURE, BLOOD (ROUTINE X 2)  CULTURE, BLOOD (ROUTINE X 2)  URINE CULTURE  LACTIC ACID, PLASMA  I-STAT BETA HCG BLOOD, ED ([facility], [facility], [facility] ONLY)    EKG None  Radiology Dg Chest 2 View  Result Date: 09/19/2018 CLINICAL DATA:  Hypotensive, recent pneumonia EXAM: CHEST - 2 VIEW COMPARISON:  Chest radiograph 08/26/2018 FINDINGS: Heart size within normal limits. Ill-defined and subtly nodular pulmonary opacities are again demonstrated, more prominent within the right lung. Findings are similar to prior chest radiograph 09/03/2018 and suspicious for infection. No evidence of pneumothorax or pleural effusion. No acute bony abnormality. IMPRESSION: Ill-defined and subtly nodular pulmonary opacities are similar to prior exam 08/26/2018 and suspicious for infection, suspected atypical pneumonia. Electronically Signed   By: Kellie Simmering   On: 09/19/2018 13:33    Procedures Procedures (including critical care time)  Medications Ordered in ED Medications  sodium chloride 0.9 % bolus 1,000 mL (0 mLs Intravenous Stopped 09/19/18 1514)     Initial Impression / Assessment and Plan / ED Course  I have reviewed the triage vital signs and the nursing notes.  Pertinent labs & imaging results that were available during my care of the patient were reviewed by  me and considered in my medical decision  making (see chart for details).        Tea Lauff is a 33 y.o. female with a past medical history significant for HIV/AIDS and recent admission for sepsis with pneumonia who presents from the cancer center for hypotension.  Patient reports that she had a scheduled follow-up appointment from her recent admission for sepsis with pneumonia.  She reports is been feeling well for the last several days and was feeling normal this morning prior to her appointment.  She reports that when she was across the hospital at the cancer center, she was getting blood drawn and felt lightheaded.  She reports that they took her blood pressure and was found to be in the 50s and 60s.  Patient was brought over to emergency department.  She currently feels much better and her blood pressure on monitor evaluation was 123XX123 systolic.  She no longer feels lightheaded or fatigue.  She reports that she has had no recent fevers, chills, congestion, cough, chest pain, shortness of breath, palpitations, nausea, vomiting, urinary symptoms, constipation, or diarrhea.  She does report she is been urinating more due to the Lasix she is been taking.  She denies any new edema rashes or pains.  She denies any other symptoms.  She is feeling much better now and thinks it was due to the blood draw.  She does not feel she is infected.  On exam, patient had some mildly rhonchus lungs in her bases of her lungs left worse than right.  Chest and back were nontender.  Abdomen was nontender.  Normal pulses in extremities.  No lower extremity tenderness or edema present.  Patient well-appearing with heart rate below 100 on my evaluation.  Clinical aspect patient was hypotensive after having the blood drawn with more of a vagal hypotensive episode.  That being said, I am concerned that patient agrees admitted multiple times for sepsis and pneumonia.  Patient will have screening work-up including labs chest x-ray  urinalysis and cultures.  We will have a shared decision-making conversation after work-up to determine if her hypotension was due to the blood draw or possible occult infection.  Anticipate reassessment.  7:02 PM Patient's work-up was reassuring.  Urinalysis does not show UTI.  Lactic acid initially slightly elevated but after fluids normalized.  She is not pregnant.  Chest x-ray looks unchanged from prior with some pulmonary opacities suspect atypical infection.  As patient denies any fevers, chills, congestion, cough, and feels the same as prior, patient does not think she has pneumonia at this time.  Given her HIV/AIDS, elevated lactate, abnormality on lung exam, and possible pneumonia on x-ray, she was offered antibiotics and admission however patient does not want this.  Patient's blood pressure has been over 123XX123 systolic during all of the ED evaluation.  Patient thinks that her low blood pressure was when she saw the blood and not due to infection.  Given her stable vitals during her ED stay and her work-up, a shared decision-making conversation was held and we decided the patient was safe for discharge home.  Patient will observe strict return precautions and follow-up.  We agreed not to treat with antibiotics at this time and that patient will go home.  Patient was able to eat and drink without any difficulty and feels better.  Patient discharged in good condition.  Final Clinical Impressions(s) / ED Diagnoses   Final diagnoses:  [facility]        ED Discharge Orders    None  Clinical Impression: 1. Lightheadedness     Disposition: Discharge  Condition: Good  I have discussed the results, Dx and Tx plan with the pt(& family if present). He/she/they expressed understanding and agree(s) with the plan. Discharge instructions discussed at great length. Strict return precautions discussed and pt &/or family have verbalized understanding of the instructions. No further  questions at time of discharge.    New Prescriptions   No medications on file    Follow Up: Lajean Manes, MD [address] [place] [address] [place] DEPT [place] [id number] [facility] [address] Bessie [place]       Javarion Douty, Gwenyth Allegra, MD 09/19/18 [place]

## 2018-09-19 NOTE — Telephone Encounter (Signed)
Faxed completed forms to [employer] for patient today. Received confirmation, original copy has been placed in scanning pile. Copy was made and is in triage in accordion folder if any changes need to be made.  [place]

## 2018-09-21 LAB — URINE CULTURE

## 2018-09-21 LAB — ACID FAST CULTURE WITH REFLEXED SENSITIVITIES (MYCOBACTERIA): Acid Fast Culture: NEGATIVE

## 2018-09-23 DIAGNOSIS — D696 Thrombocytopenia, unspecified: Secondary | ICD-10-CM | POA: Diagnosis not present

## 2018-09-23 DIAGNOSIS — R229 Localized swelling, mass and lump, unspecified: Secondary | ICD-10-CM | POA: Diagnosis not present

## 2018-09-23 DIAGNOSIS — D649 Anemia, unspecified: Secondary | ICD-10-CM | POA: Diagnosis not present

## 2018-09-23 MED FILL — ATOVAQUONE 750 MG/5ML SUSP: 750 | 30 days supply | Qty: 300 | Fill #0

## 2018-09-23 MED FILL — BIKTARVY 50-200-25 MG TABS: 50-200-25 | 30 days supply | Qty: 30 | Fill #2

## 2018-09-24 ENCOUNTER — Ambulatory Visit: Payer: 59

## 2018-09-24 ENCOUNTER — Inpatient Hospital Stay: Payer: 59 | Admitting: Hematology

## 2018-09-24 ENCOUNTER — Inpatient Hospital Stay: Payer: 59

## 2018-09-24 ENCOUNTER — Other Ambulatory Visit: Payer: Self-pay | Admitting: *Deleted

## 2018-09-24 ENCOUNTER — Other Ambulatory Visit: Payer: Self-pay

## 2018-09-24 VITALS — HR 112 | Temp 98.7°F | Resp 18 | Ht 61.5 in | Wt 110.8 lb

## 2018-09-24 DIAGNOSIS — D61818 Other pancytopenia: Secondary | ICD-10-CM

## 2018-09-24 DIAGNOSIS — Z87891 Personal history of nicotine dependence: Secondary | ICD-10-CM | POA: Diagnosis not present

## 2018-09-24 DIAGNOSIS — D649 Anemia, unspecified: Secondary | ICD-10-CM | POA: Diagnosis not present

## 2018-09-24 DIAGNOSIS — Z21 Asymptomatic human immunodeficiency virus [HIV] infection status: Secondary | ICD-10-CM | POA: Diagnosis not present

## 2018-09-24 DIAGNOSIS — D72829 Elevated white blood cell count, unspecified: Secondary | ICD-10-CM | POA: Diagnosis not present

## 2018-09-24 DIAGNOSIS — D696 Thrombocytopenia, unspecified: Secondary | ICD-10-CM | POA: Diagnosis not present

## 2018-09-24 DIAGNOSIS — Z79899 Other long term (current) drug therapy: Secondary | ICD-10-CM | POA: Diagnosis not present

## 2018-09-24 LAB — CBC WITH DIFFERENTIAL (CANCER CENTER ONLY)
Basophils Absolute: 0 10*3/uL (ref 0.0–0.1)
Basophils Relative: 0 %
Eosinophils Absolute: 0 10*3/uL (ref 0.0–0.5)
Eosinophils Relative: 0 %
HCT: 26.5 % — ABNORMAL LOW (ref 36.0–46.0)
Hemoglobin: 8.4 g/dL — ABNORMAL LOW (ref 12.0–15.0)
Lymphocytes Relative: 15 %
Lymphs Abs: 0.9 10*3/uL (ref 0.7–4.0)
MCH: 30.1 pg (ref 26.0–34.0)
MCHC: 31.7 g/dL (ref 30.0–36.0)
MCV: 95 fL (ref 80.0–100.0)
Monocytes Absolute: 1 10*3/uL (ref 0.1–1.0)
Monocytes Relative: 17 %
Neutro Abs: 4 10*3/uL (ref 1.7–7.7)
Neutrophils Relative %: 66 %
Platelet Count: 116 10*3/uL — ABNORMAL LOW (ref 150–400)
RBC: 2.79 MIL/uL — ABNORMAL LOW (ref 3.87–5.11)
RDW: 23.2 % — ABNORMAL HIGH (ref 11.5–15.5)
WBC Count: 6.3 10*3/uL (ref 4.0–10.5)
nRBC: 0 % (ref 0.0–0.2)
nRBC: 0 /100 WBC

## 2018-09-24 LAB — CMP (CANCER CENTER ONLY)
ALT: 9 U/L (ref 0–44)
AST: 16 U/L (ref 15–41)
Albumin: 2.6 g/dL — ABNORMAL LOW (ref 3.5–5.0)
Alkaline Phosphatase: 132 U/L — ABNORMAL HIGH (ref 38–126)
Anion gap: 11 (ref 5–15)
BUN: 7 mg/dL (ref 6–20)
CO2: 18 mmol/L — ABNORMAL LOW (ref 22–32)
Calcium: 8.2 mg/dL — ABNORMAL LOW (ref 8.9–10.3)
Chloride: 106 mmol/L (ref 98–111)
Creatinine: 0.63 mg/dL (ref 0.44–1.00)
GFR, Est AFR Am: >60 mL/min (ref >60)
GFR, Estimated: >60 mL/min (ref >60)
Glucose, Bld: 91 mg/dL (ref 70–99)
Potassium: 3.5 mmol/L (ref 3.5–5.1)
Sodium: 135 mmol/L (ref 135–145)
Total Bilirubin: 1.6 mg/dL — ABNORMAL HIGH (ref 0.3–1.2)
Total Protein: 8 g/dL (ref 6.5–8.1)

## 2018-09-24 LAB — CULTURE, BLOOD (ROUTINE X 2): Culture: NO GROWTH

## 2018-09-24 LAB — TYPE AND SCREEN
ABO/RH(D): O POS
Antibody Screen: NEGATIVE

## 2018-09-24 NOTE — Progress Notes (Signed)
HEMATOLOGY/ONCOLOGY CONSULTATION NOTE  Date of Service: 09/24/2018  Patient Care Team: Lajean Manes, MD as PCP - General (Internal Medicine)  CHIEF COMPLAINT:  multfactorial anemia and thrombocytopenia  HISTORY OF PRESENTING ILLNESS:   Krystal Short is a 33 y.o. female here for evaluation and management of her multfactorial anemia and thrombocytopenia.. The pt reports that she is doing well overall.  The pt reports that she has been trying to stay active at home. Her family is assisting her as much as possible and is helping her with her strength building. She has had a good appetite and she has been feeling well overall.   Of note since the patient's last visit, pt has had a bone marrow biopsy ([id number]) completed on 09/11/2018 with results revealing "Hypercellular bone marrow for age with trilineage hematopoiesis. Peripheral blood: Normocytic-normochromic anemia. Neutrophilic left shift. Thrombocytopenia."  Lab results today (09/24/18) of CBC w/diff and CMP is as follows: all values are WNL except for RBC at 2.79, Hgb at 8.4, HCT at 26.5, RDW at 23.2, Platelet count at 116, CO2 at 18, Calcium at 8.2, Albumin at 2.6, Alkaline Phosphatase at 132, and Total Bilirubin at 1.6.  On review of systems, pt reports good appetite and denies fatigue and any other symptoms.    MEDICAL HISTORY:  Past Medical History:  Diagnosis Date   Acute hyponatremia 06/03/2017   Anemia    CAP (community acquired pneumonia) 06/03/2017   Facial dermatitis 06/03/2017   HIV (human immunodeficiency virus infection) ([place])    diagnosed in April 2019   Pleurisy 06/03/2017   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 ([place])    Thrush of mouth and esophagus ([place])    UTI (urinary tract infection)     SURGICAL HISTORY: Past Surgical History:  Procedure Laterality Date   BIOPSY  07/12/2018   Procedure: BIOPSY;  Surgeon: Laurin Coder, MD;  Location: [facility];  Service: Endoscopy;;    BRONCHIAL WASHINGS  07/12/2018   Procedure: BRONCHIAL WASHINGS;  Surgeon: Laurin Coder, MD;  Location: [facility];  Service: Endoscopy;;   ENDOBRONCHIAL ULTRASOUND N/A 07/12/2018   Procedure: ENDOBRONCHIAL ULTRASOUND;  Surgeon: Laurin Coder, MD;  Location: [facility];  Service: Endoscopy;  Laterality: N/A;   FINE NEEDLE ASPIRATION BIOPSY  07/12/2018   Procedure: FINE NEEDLE ASPIRATION BIOPSY;  Surgeon: Laurin Coder, MD;  Location: [facility];  Service: Endoscopy;;   NO PAST SURGERIES     VIDEO BRONCHOSCOPY N/A 07/12/2018   Procedure: VIDEO BRONCHOSCOPY WITHOUT FLUORO;  Surgeon: Laurin Coder, MD;  Location: [facility];  Service: Endoscopy;  Laterality: N/A;    SOCIAL HISTORY: Social History   Socioeconomic History   Marital status: Married    Spouse name: Belenda Cruise   Number of children: Not on file   Years of education: college   Highest education level: Bachelor's degree (e.g., BA, AB, BS)  Occupational History   Occupation: [employer]    Employer: [employer]   Financial resource strain: Not hard at all   Food insecurity    Worry: Never true    Inability: Never true   Transportation needs    Medical: No    Non-medical: No  Tobacco Use   Smoking status: Former Smoker    Packs/day: 0.25    Years: 8.00    Pack years: 2.00    Types: Cigarettes, Cigars    Quit date: 12/25/2016    Years since quitting: 1.7   Smokeless  tobacco: Never Used  Substance and Sexual Activity   Alcohol use: Yes    Comment: weekend/socially   Drug use: No   Sexual activity: Not Currently    Birth control/protection: Injection  Lifestyle   Physical activity    Days per week: 0 days    Minutes per session: 0 min   Stress: Not at all  Relationships   Social connections    Talks on phone: Once a week    Gets together: Once a week    Attends religious service: More than 4 times per year    Active member of club or  organization: No    Attends meetings of clubs or organizations: Never    Relationship status: Married   Intimate partner violence    Fear of current or ex partner: No    Emotionally abused: No    Physically abused: No    Forced sexual activity: No  Other Topics Concern   Not on file  Social History Narrative   Patient does not drive, if her husband cannot provide transportation she takes uses Lyft services   She has not smoked since 2018    FAMILY HISTORY: Family History  Problem Relation Age of Onset   Healthy Father    Healthy Mother    Asthma Paternal Grandmother     ALLERGIES:  is allergic to heparin.  MEDICATIONS:  Current Outpatient Medications  Medication Sig Dispense Refill   atovaquone (MEPRON) 750 MG/5ML suspension Take 10 mLs (1,500 mg total) by mouth daily. 300 mL 0   azithromycin (ZITHROMAX) 600 MG tablet Take 2 tablets (1,200 mg total) by mouth once a week. (Patient taking differently: Take 1,200 mg by mouth every Tuesday. ) 10 tablet 0   bictegravir-emtricitabine-tenofovir AF (BIKTARVY) 50-200-25 MG TABS tablet Take 1 tablet by mouth daily. 30 tablet 5   metoprolol tartrate (LOPRESSOR) 25 MG tablet Take 0.5 tablets (12.5 mg total) by mouth 2 (two) times daily. 60 tablet 2   ondansetron (ZOFRAN) 4 MG tablet Take 1 tablet (4 mg total) by mouth every 6 (six) hours. (Patient not taking: Reported on 09/18/2018) 12 tablet 0   polyethylene glycol (MIRALAX / GLYCOLAX) 17 g packet Take 17 g by mouth daily as needed for mild constipation. (Patient not taking: Reported on 09/18/2018) 14 each 0   predniSONE (DELTASONE) 10 MG tablet Prednisone 10 mg daily x 14 days then  Prednisone 5 mg po daily x 7 days then stop (Patient taking differently: Take 5-10 mg by mouth See admin instructions. Take 10 mg daily x 14 days then 5 mg po daily x 7 days then stop) 21 tablet 0   No current facility-administered medications for this visit.     REVIEW OF SYSTEMS:   A 10+ POINT  REVIEW OF SYSTEMS WAS OBTAINED including neurology, dermatology, psychiatry, cardiac, respiratory, lymph, extremities, GI, GU, Musculoskeletal, constitutional, breasts, reproductive, HEENT.  All pertinent positives are noted in the HPI.  All others are negative.    PHYSICAL EXAMINATION: ECOG PERFORMANCE STATUS: 2 - Symptomatic, <50% confined to bed  . Vitals:   09/24/18 1515  Pulse: (!) 112  Resp: 18  Temp: 98.7 F (37.1 C)  SpO2: 100%   Filed Weights   09/24/18 1515  Weight: 110 lb 12.8 oz (50.3 kg)   .Body mass index is 20.6 kg/m.  GENERAL:alert, in no acute distress and comfortable SKIN: no acute rashes, no significant lesions EYES: conjunctiva are pink and non-injected, sclera anicteric OROPHARYNX: MMM, no exudates, no oropharyngeal  erythema or ulceration NECK: supple, no JVD LYMPH:  no palpable lymphadenopathy in the cervical, axillary or inguinal regions LUNGS: clear to auscultation b/l with normal respiratory effort HEART: regular rate & rhythm ABDOMEN:  normoactive bowel sounds , non tender, not distended. Extremity: no pedal edema PSYCH: alert & oriented x 3 with fluent speech NEURO: no focal motor/sensory deficits  LABORATORY DATA:  I have reviewed the data as listed  . CBC Latest Ref Rng & Units 09/19/2018 09/13/2018 09/12/2018  WBC 4.0 - 10.5 K/uL 4.8 7.5 6.7  Hemoglobin 12.0 - 15.0 g/dL 9.4(L) 7.9(L) 7.7(L)  Hematocrit 36.0 - 46.0 % 29.4(L) 25.9(L) 24.0(L)  Platelets 150 - 400 K/uL 74(L) 23(LL) 18(LL)    . CMP Latest Ref Rng & Units 09/19/2018 09/12/2018 09/11/2018  Glucose 70 - 99 mg/dL 150(H) 89 86  BUN 6 - 20 mg/dL _0 Creatinine 0.44 - 1.00 mg/dL 0.74 0.40(L) 0.47  Sodium 135 - 145 mmol/L 134(L) 135 136  Potassium 3.5 - 5.1 mmol/L 3.2(L) 4.1 4.0  Chloride 98 - 111 mmol/L 104 109 106  CO2 22 - 32 mmol/L 18(L) 23 22  Calcium 8.9 - 10.3 mg/dL 8.6(L) 7.3(L) 7.3(L)  Total Protein 6.5 - 8.1 g/dL 8.1 5.6(L) -  Total Bilirubin 0.3 - 1.2 mg/dL 1.5(H) 1.4(H)  -  Alkaline Phos 38 - 126 U/L 164(H) 230(H) -  AST 15 - 41 U/L 24 52(H) -  ALT 0 - 44 U/L 20 24 -   09/11/2018 Bone Marrow Biopsy   09/11/2018 [employer]     RADIOGRAPHIC STUDIES: I have personally reviewed the radiological images as listed and agreed with the findings in the report. Dg Chest 2 View  Result Date: 09/19/2018 CLINICAL DATA:  Hypotensive, recent pneumonia EXAM: CHEST - 2 VIEW COMPARISON:  Chest radiograph 08/26/2018 FINDINGS: Heart size within normal limits. Ill-defined and subtly nodular pulmonary opacities are again demonstrated, more prominent within the right lung. Findings are similar to prior chest radiograph 09/03/2018 and suspicious for infection. No evidence of pneumothorax or pleural effusion. No acute bony abnormality. IMPRESSION: Ill-defined and subtly nodular pulmonary opacities are similar to prior exam 08/26/2018 and suspicious for infection, suspected atypical pneumonia. Electronically Signed   By: Kellie Simmering   On: 09/19/2018 13:33   Ct Biopsy  Result Date: 09/11/2018 CLINICAL DATA:  HIV positive. Elevated white blood cell count, anemia, thrombocytopenia EXAM: CT GUIDED DEEP ILIAC BONE ASPIRATION AND CORE BIOPSY TECHNIQUE: Patient was placed prone on the CT gantry and limited axial scans through the pelvis were obtained. Appropriate skin entry site was identified. Skin site was marked, prepped with chlorhexidine, draped in usual sterile fashion, and infiltrated locally with 1% lidocaine. Intravenous Fentanyl 114mg and Versed 236mwere administered as conscious sedation during continuous monitoring of the patient's level of consciousness and physiological / cardiorespiratory status by the radiology RN, with a total moderate sedation time of 12 minutes. Under CT fluoroscopic guidance an 11-gauge Cook trocar bone needle was advanced into the right iliac bone just lateral to the sacroiliac joint. Once needle tip position was confirmed, core and aspiration samples were  obtained, submitted to pathology for approval. A second core was obtained for cultures as requested. Patient tolerated procedure well. COMPLICATIONS: COMPLICATIONS none IMPRESSION: 1. Technically successful CT guided right iliac bone core and aspiration biopsy. Electronically Signed   By: D Lucrezia Europe.D.   On: 09/11/2018 12:40   Dg Chest Port 1 View  Result Date: 09/03/2018 CLINICAL DATA:  Cough and fever.  History  of HIV disease EXAM: PORTABLE CHEST 1 VIEW COMPARISON:  August 12, 2018 chest radiograph; CT angiogram chest August 02, 2018 FINDINGS: There remains fine reticulonodular interstitial disease throughout the lungs, primarily in the mid and lower lung zones. The appearance is similar to most recent chest radiograph with less opacity overall compared to most recent CT. No new opacity evident. No consolidation. Heart size and pulmonary vascularity are normal. No adenopathy. No bone lesions. IMPRESSION: Fine reticulonodular interstitial opacity bilaterally, similar to most recent chest radiograph, likely of infectious etiology. Suspect multifocal atypical organism pneumonia. No consolidation. No new opacity. No adenopathy evident. Electronically Signed   By: Lowella Grip III M.D.   On: 09/03/2018 15:39   Ct Bone Marrow Biopsy & Aspiration  Result Date: 09/11/2018 CLINICAL DATA:  HIV positive. Elevated white blood cell count, anemia, thrombocytopenia EXAM: CT GUIDED DEEP ILIAC BONE ASPIRATION AND CORE BIOPSY TECHNIQUE: Patient was placed prone on the CT gantry and limited axial scans through the pelvis were obtained. Appropriate skin entry site was identified. Skin site was marked, prepped with chlorhexidine, draped in usual sterile fashion, and infiltrated locally with 1% lidocaine. Intravenous Fentanyl 18mg and Versed 222mwere administered as conscious sedation during continuous monitoring of the patient's level of consciousness and physiological / cardiorespiratory status by the radiology RN, with a  total moderate sedation time of 12 minutes. Under CT fluoroscopic guidance an 11-gauge Cook trocar bone needle was advanced into the right iliac bone just lateral to the sacroiliac joint. Once needle tip position was confirmed, core and aspiration samples were obtained, submitted to pathology for approval. A second core was obtained for cultures as requested. Patient tolerated procedure well. COMPLICATIONS: COMPLICATIONS none IMPRESSION: 1. Technically successful CT guided right iliac bone core and aspiration biopsy. Electronically Signed   By: D Lucrezia Europe.D.   On: 09/11/2018 12:40    ASSESSMENT & PLAN:  MeHara Milhollands a [age].o. female with:  1. Anemia  2. Thrombocytopenia 3. Leukocytosis 4. PCP recently, recurrent sepsis. Unclear etiology of pulmonary infiltrates. 5. HIV/AIDS on Biktarvy. On Atovaquone for PCP prophylaxis and Azithromycin for MAI prophylaxis  6. Splenomegaly noted on prior abdominal ultrasound    PLAN:  -Discussed pt labwork today, 09/24/18; all values are WNL except for RBC at 2.79, Hgb at 8.4, HCT at 26.5, RDW at 23.2, Platelet count at 116, CO2 at 18, Calcium at 8.2, Albumin at 2.6, Alkaline Phosphatase at 132, and Total Bilirubin at 1.6. -09/11/2018 Bone Marrow Biopsy ([id number] results revealing "Hypercellular bone marrow for age with trilineage hematopoiesis. Peripheral blood: Normocytic-normochromic anemia. Neutrophilic left shift. Thrombocytopenia." -Discussed HIV influence on blood counts -Dicussed N-plate shots to improve platelet counts; we can space out her follow ups if she does not need these. Hold Nplate for now and monitor labs. -Discussed follow up with pulmonologist, Dr. WeMelvyn NovasDiscussed follow up with infectious disease Dr.   FOBettina GaviaP: -continue weekly labs x 4 -appointment for PRBC transfusion x 1 in 1 week -Nplate weekly prn for PLT<50k -RTC with Dr KaIrene Limbon 2 weeks   All of the patients questions were answered with apparent satisfaction.  The patient knows to call the clinic with any problems, questions or concerns.  The total time spent in the appt was 25 minutes and more than 50% was on counseling and direct patient cares.     GaSullivan LoneD MSMount SummitAHIVMS SCAdvanced Pain ManagementTTavares Surgery LLCematology/Oncology Physician CoVa [facility](Office):       [phone] cell):  [phone] (Fax):           [phone]  09/24/2018 3:32 PM  I, Jacqualyn Posey, am acting as a Education administrator for Dr. Sullivan Lone.   .I have reviewed the above documentation for accuracy and completeness, and I agree with the above. Brunetta Genera MD

## 2018-09-24 NOTE — Progress Notes (Signed)
This encounter was created in error - please disregard.

## 2018-09-24 NOTE — Progress Notes (Unsigned)
Hold N plate at this time per Dr. Irene Limbo. Pt injection apt was canceled.

## 2018-09-25 ENCOUNTER — Telehealth: Payer: Self-pay | Admitting: Hematology

## 2018-09-25 LAB — ABO/RH: ABO/RH(D): O POS

## 2018-09-25 NOTE — Telephone Encounter (Signed)
Scheduled appt per 8/18 los.  Spoke with patient and she is aware of her appt date and time.

## 2018-09-30 ENCOUNTER — Other Ambulatory Visit: Payer: Self-pay | Admitting: *Deleted

## 2018-09-30 DIAGNOSIS — D649 Anemia, unspecified: Secondary | ICD-10-CM

## 2018-09-30 DIAGNOSIS — D696 Thrombocytopenia, unspecified: Secondary | ICD-10-CM

## 2018-10-01 ENCOUNTER — Telehealth: Payer: Self-pay | Admitting: *Deleted

## 2018-10-01 ENCOUNTER — Inpatient Hospital Stay (HOSPITAL_BASED_OUTPATIENT_CLINIC_OR_DEPARTMENT_OTHER): Payer: 59 | Admitting: Medical

## 2018-10-01 ENCOUNTER — Inpatient Hospital Stay (HOSPITAL_COMMUNITY)
Admission: EM | Admit: 2018-10-01 | Discharge: 2018-10-04 | DRG: 975 | Disposition: A | Discharge status: Home / Self Care | Payer: 59 | Source: Ambulatory Visit | Attending: Internal Medicine | Admitting: Internal Medicine

## 2018-10-01 ENCOUNTER — Inpatient Hospital Stay: Payer: 59

## 2018-10-01 ENCOUNTER — Other Ambulatory Visit: Payer: Self-pay

## 2018-10-01 ENCOUNTER — Emergency Department (HOSPITAL_COMMUNITY): Payer: 59

## 2018-10-01 ENCOUNTER — Encounter (HOSPITAL_COMMUNITY): Payer: Self-pay

## 2018-10-01 VITALS — BP 65/46 | HR 111 | Temp 98.8°F | Resp 21

## 2018-10-01 DIAGNOSIS — M6281 Muscle weakness (generalized): Secondary | ICD-10-CM | POA: Diagnosis not present

## 2018-10-01 DIAGNOSIS — D6959 Other secondary thrombocytopenia: Secondary | ICD-10-CM | POA: Diagnosis present

## 2018-10-01 DIAGNOSIS — Z825 Family history of asthma and other chronic lower respiratory diseases: Secondary | ICD-10-CM

## 2018-10-01 DIAGNOSIS — Z20828 Contact with and (suspected) exposure to other viral communicable diseases: Secondary | ICD-10-CM | POA: Diagnosis present

## 2018-10-01 DIAGNOSIS — D696 Thrombocytopenia, unspecified: Secondary | ICD-10-CM

## 2018-10-01 DIAGNOSIS — D72829 Elevated white blood cell count, unspecified: Secondary | ICD-10-CM

## 2018-10-01 DIAGNOSIS — R21 Rash and other nonspecific skin eruption: Secondary | ICD-10-CM | POA: Diagnosis present

## 2018-10-01 DIAGNOSIS — D649 Anemia, unspecified: Secondary | ICD-10-CM

## 2018-10-01 DIAGNOSIS — I959 Hypotension, unspecified: Secondary | ICD-10-CM | POA: Diagnosis present

## 2018-10-01 DIAGNOSIS — N179 Acute kidney failure, unspecified: Secondary | ICD-10-CM | POA: Diagnosis present

## 2018-10-01 DIAGNOSIS — R05 Cough: Secondary | ICD-10-CM | POA: Diagnosis not present

## 2018-10-01 DIAGNOSIS — Z87891 Personal history of nicotine dependence: Secondary | ICD-10-CM | POA: Diagnosis not present

## 2018-10-01 DIAGNOSIS — J189 Pneumonia, unspecified organism: Secondary | ICD-10-CM | POA: Diagnosis present

## 2018-10-01 DIAGNOSIS — D638 Anemia in other chronic diseases classified elsewhere: Secondary | ICD-10-CM | POA: Diagnosis present

## 2018-10-01 DIAGNOSIS — R0682 Tachypnea, not elsewhere classified: Secondary | ICD-10-CM

## 2018-10-01 DIAGNOSIS — B20 Human immunodeficiency virus [HIV] disease: Principal | ICD-10-CM | POA: Diagnosis present

## 2018-10-01 LAB — CBC WITH DIFFERENTIAL/PLATELET
Abs Immature Granulocytes: 0.2 10*3/uL — ABNORMAL HIGH (ref 0.00–0.07)
Band Neutrophils: 6 %
Basophils Absolute: 0 10*3/uL (ref 0.0–0.1)
Basophils Relative: 0 %
Eosinophils Absolute: 0 10*3/uL (ref 0.0–0.5)
Eosinophils Relative: 0 %
HCT: 18.8 % — ABNORMAL LOW (ref 36.0–46.0)
Hemoglobin: 6 g/dL — CL (ref 12.0–15.0)
Lymphocytes Relative: 11 %
Lymphs Abs: 1.8 10*3/uL (ref 0.7–4.0)
MCH: 29.9 pg (ref 26.0–34.0)
MCHC: 31.9 g/dL (ref 30.0–36.0)
MCV: 93.5 fL (ref 80.0–100.0)
Metamyelocytes Relative: 1 %
Monocytes Absolute: 1.1 10*3/uL — ABNORMAL HIGH (ref 0.1–1.0)
Monocytes Relative: 7 %
Neutro Abs: 13 10*3/uL (ref 1.7–17.7)
Neutrophils Relative %: 75 %
Platelets: 39 10*3/uL — ABNORMAL LOW (ref 150–400)
RBC: 2.01 MIL/uL — ABNORMAL LOW (ref 3.87–5.11)
RDW: 21.4 % — ABNORMAL HIGH (ref 11.5–15.5)
WBC: 16.1 10*3/uL — ABNORMAL HIGH (ref 4.0–10.5)
nRBC: 0.6 % — ABNORMAL HIGH (ref 0.0–0.2)

## 2018-10-01 LAB — PREGNANCY, URINE: Preg Test, Ur: NEGATIVE

## 2018-10-01 LAB — COMPREHENSIVE METABOLIC PANEL
ALT: 10 U/L (ref 0–44)
AST: 21 U/L (ref 15–41)
Albumin: 1.9 g/dL — ABNORMAL LOW (ref 3.5–5.0)
Alkaline Phosphatase: 189 U/L — ABNORMAL HIGH (ref 38–126)
Anion gap: 9 (ref 5–15)
BUN: 34 mg/dL — ABNORMAL HIGH (ref 6–20)
CO2: 16 mmol/L — ABNORMAL LOW (ref 22–32)
Calcium: 7.2 mg/dL — ABNORMAL LOW (ref 8.9–10.3)
Chloride: 105 mmol/L (ref 98–111)
Creatinine, Ser: 1.45 mg/dL — ABNORMAL HIGH (ref 0.44–1.00)
GFR calc Af Amer: 55 mL/min — ABNORMAL LOW (ref >60)
GFR calc non Af Amer: 48 mL/min — ABNORMAL LOW (ref >60)
Glucose, Bld: 130 mg/dL — ABNORMAL HIGH (ref 70–99)
Potassium: 4 mmol/L (ref 3.5–5.1)
Sodium: 130 mmol/L — ABNORMAL LOW (ref 135–145)
Total Bilirubin: 1.9 mg/dL — ABNORMAL HIGH (ref 0.3–1.2)
Total Protein: 6.4 g/dL — ABNORMAL LOW (ref 6.5–8.1)

## 2018-10-01 LAB — URINALYSIS, ROUTINE W REFLEX MICROSCOPIC
Bilirubin Urine: NEGATIVE
Glucose, UA: NEGATIVE mg/dL
Hgb urine dipstick: NEGATIVE
Ketones, ur: NEGATIVE mg/dL
Leukocytes,Ua: NEGATIVE
Nitrite: NEGATIVE
Protein, ur: NEGATIVE mg/dL
Specific Gravity, Urine: 1.006 (ref 1.005–1.030)
pH: 5 (ref 5.0–8.0)

## 2018-10-01 LAB — APTT: aPTT: 43 seconds — ABNORMAL HIGH (ref 24–36)

## 2018-10-01 LAB — PREPARE RBC (CROSSMATCH)

## 2018-10-01 LAB — LACTIC ACID, PLASMA: Lactic Acid, Venous: 1.4 mmol/L (ref 0.5–1.9)

## 2018-10-01 LAB — SAMPLE TO BLOOD BANK

## 2018-10-01 LAB — PROTIME-INR
INR: 1.3 — ABNORMAL HIGH (ref 0.8–1.2)
Prothrombin Time: 16.3 seconds — ABNORMAL HIGH (ref 11.4–15.2)

## 2018-10-01 LAB — LACTATE DEHYDROGENASE: LDH: 255 U/L — ABNORMAL HIGH (ref 98–192)

## 2018-10-01 LAB — I-STAT BETA HCG BLOOD, ED (MC, WL, AP ONLY): I-stat hCG, quantitative: <5 m[IU]/mL (ref <5)

## 2018-10-01 MED ORDER — SODIUM CHLORIDE 0.9% FLUSH
3.0000 mL | Freq: Two times a day (BID) | INTRAVENOUS | Status: DC
  Administered 2018-10-01 – 2018-10-04 (×6): 3 mL via INTRAVENOUS

## 2018-10-01 MED ORDER — CHLORHEXIDINE GLUCONATE CLOTH 2 % EX PADS
6.0000 | MEDICATED_PAD | Freq: Every day | CUTANEOUS | Status: DC
  Administered 2018-10-02: 6 via TOPICAL

## 2018-10-01 MED ORDER — SODIUM CHLORIDE 0.9 % IV SOLN
100.0000 mg | Freq: Two times a day (BID) | INTRAVENOUS | Status: DC
  Filled 2018-10-01: qty 100

## 2018-10-01 MED ORDER — ONDANSETRON HCL 4 MG/2ML IJ SOLN: 4.0000 mg | Freq: Four times a day (QID) | INTRAMUSCULAR | Status: DC | PRN

## 2018-10-01 MED ORDER — SODIUM CHLORIDE 0.9 % IV SOLN
INTRAVENOUS | Status: DC
  Administered 2018-10-01: via INTRAVENOUS

## 2018-10-01 MED ORDER — ACETAMINOPHEN 650 MG RE SUPP: 650.0000 mg | Freq: Four times a day (QID) | RECTAL | Status: DC | PRN

## 2018-10-01 MED ORDER — ACETAMINOPHEN 325 MG PO TABS
650.0000 mg | ORAL_TABLET | Freq: Four times a day (QID) | ORAL | Status: DC | PRN
  Administered 2018-10-01: 650 mg via ORAL
  Filled 2018-10-01: qty 2

## 2018-10-01 MED ORDER — ACETAMINOPHEN 325 MG PO TABS
ORAL_TABLET | ORAL | Status: AC
  Filled 2018-10-01: qty 2

## 2018-10-01 MED ORDER — LACTATED RINGERS IV BOLUS (SEPSIS)
500.0000 mL | Freq: Once | INTRAVENOUS | Status: AC
  Administered 2018-10-01: 500 mL via INTRAVENOUS

## 2018-10-01 MED ORDER — DIPHENHYDRAMINE HCL 25 MG PO CAPS: 25.0000 mg | ORAL_CAPSULE | Freq: Once | ORAL | Status: DC

## 2018-10-01 MED ORDER — AZITHROMYCIN 600 MG PO TABS: 1200.0000 mg | ORAL_TABLET | ORAL | Status: DC

## 2018-10-01 MED ORDER — DIPHENHYDRAMINE HCL 25 MG PO CAPS
ORAL_CAPSULE | ORAL | Status: AC
  Filled 2018-10-01: qty 1

## 2018-10-01 MED ORDER — SODIUM CHLORIDE 0.9 % IV SOLN
100.0000 mg | Freq: Once | INTRAVENOUS | Status: DC
  Administered 2018-10-01: 100 mg via INTRAVENOUS
  Filled 2018-10-01: qty 100

## 2018-10-01 MED ORDER — SODIUM CHLORIDE 0.9 % IV SOLN: 10.0000 mL/h | Freq: Once | INTRAVENOUS | Status: DC

## 2018-10-01 MED ORDER — SODIUM CHLORIDE 0.9 % IV SOLN: 2.0000 g | Freq: Once | INTRAVENOUS | Status: DC

## 2018-10-01 MED ORDER — ACETAMINOPHEN 325 MG PO TABS: 650.0000 mg | ORAL_TABLET | Freq: Once | ORAL | Status: DC

## 2018-10-01 MED ORDER — PREDNISONE 5 MG PO TABS
5.0000 mg | ORAL_TABLET | Freq: Every day | ORAL | Status: DC
  Administered 2018-10-02: 08:00:00 5 mg via ORAL
  Filled 2018-10-01 (×2): qty 1

## 2018-10-01 MED ORDER — ORAL CARE MOUTH RINSE
15.0000 mL | Freq: Two times a day (BID) | OROMUCOSAL | Status: DC
  Administered 2018-10-01 – 2018-10-04 (×6): 15 mL via OROMUCOSAL

## 2018-10-01 MED ORDER — SODIUM CHLORIDE 0.9 % IV SOLN: 500.0000 mg | Freq: Once | INTRAVENOUS | Status: DC

## 2018-10-01 MED ORDER — SODIUM CHLORIDE 0.9 % IV SOLN
500.0000 mL | Freq: Once | INTRAVENOUS | Status: AC
  Administered 2018-10-01: 500 mL via INTRAVENOUS

## 2018-10-01 MED ORDER — SODIUM CHLORIDE 0.9 % IV SOLN
1.0000 g | Freq: Every day | INTRAVENOUS | Status: DC
  Administered 2018-10-02 – 2018-10-03 (×3): 1 g via INTRAVENOUS
  Filled 2018-10-01: qty 10
  Filled 2018-10-01: qty 1
  Filled 2018-10-01: qty 10
  Filled 2018-10-01 (×2): qty 1

## 2018-10-01 MED ORDER — BICTEGRAVIR-EMTRICITAB-TENOFOV 50-200-25 MG PO TABS
1.0000 | ORAL_TABLET | Freq: Every day | ORAL | Status: DC
  Administered 2018-10-02 – 2018-10-04 (×3): 1 via ORAL
  Filled 2018-10-01 (×4): qty 1

## 2018-10-01 MED ORDER — VANCOMYCIN HCL IN DEXTROSE 1-5 GM/200ML-% IV SOLN: 1000.0000 mg | Freq: Once | INTRAVENOUS | Status: DC

## 2018-10-01 MED ORDER — LACTATED RINGERS IV BOLUS (SEPSIS)
1000.0000 mL | Freq: Once | INTRAVENOUS | Status: AC
  Administered 2018-10-01: 1000 mL via INTRAVENOUS

## 2018-10-01 MED ORDER — SODIUM CHLORIDE 0.9% IV SOLUTION
250.0000 mL | Freq: Once | INTRAVENOUS | Status: DC
  Filled 2018-10-01: qty 250

## 2018-10-01 MED ORDER — ATOVAQUONE 750 MG/5ML PO SUSP
1500.0000 mg | Freq: Every day | ORAL | Status: DC
  Administered 2018-10-02 – 2018-10-04 (×3): 1500 mg via ORAL
  Filled 2018-10-01 (×3): qty 10

## 2018-10-01 MED ORDER — ROMIPLOSTIM INJECTION 500 MCG
5.0000 ug/kg | Freq: Once | SUBCUTANEOUS | Status: AC
  Administered 2018-10-01: 250 ug via SUBCUTANEOUS
  Filled 2018-10-01: qty 0.5

## 2018-10-01 MED ORDER — ONDANSETRON HCL 4 MG PO TABS: 4.0000 mg | ORAL_TABLET | Freq: Four times a day (QID) | ORAL | Status: DC | PRN

## 2018-10-01 MED ORDER — CHLORHEXIDINE GLUCONATE CLOTH 2 % EX PADS
6.0000 | MEDICATED_PAD | Freq: Every day | CUTANEOUS | Status: DC
  Administered 2018-10-02 – 2018-10-03 (×2): 6 via TOPICAL

## 2018-10-01 NOTE — ED Notes (Signed)
Unable to obtain second set of blood cultures.  Pt difficult stick

## 2018-10-01 NOTE — ED Notes (Signed)
ED TO INPATIENT HANDOFF REPORT  Name/Age/Gender Krystal Short 33 y.o. female  Code Status    Code Status Orders  (From admission, onward)         Start     Ordered   10/01/18 2112  Full code  Continuous     10/01/18 2113        Code Status History    Date Active Date Inactive Code Status Order ID Comments User Context   09/03/2018 2122 09/13/2018 2305 Full Code [id number]  Vianne Bulls, MD Inpatient   08/02/2018 0142 08/22/2018 2054 Full Code [id number]  [name], MD ED   07/04/2018 1709 07/16/2018 1809 Full Code [id number]  [name], MD Inpatient   05/28/2018 1841 05/31/2018 1915 Full Code [id number]  [name], MD Inpatient   03/08/2018 2046 03/10/2018 1736 Full Code [id number]  [name], MD ED   06/03/2017 1741 06/11/2017 2216 Full Code [id number]  [name], NP ED   Advance Care Planning Activity      Home/SNF/Other Home  Chief Complaint High Blood Pressure Levels and Abnormal labs  Level of Care/Admitting Diagnosis ED Disposition    ED Disposition Condition [facility] Area: [facility] [[id number]]  Level of Care: Stepdown [14]  Admit to SDU based on following criteria: Severe physiological/psychological symptoms:  Any diagnosis requiring assessment & intervention at least every 4 hours on an ongoing basis to obtain desired patient outcomes including stability and rehabilitation  Covid Evaluation: Person Under Investigation (PUI)  Diagnosis: Symptomatic anemia [id number]  Admitting Physician: Lenore Cordia [id number]  Attending Physician: Lenore Cordia [id number]  Estimated length of stay: past midnight tomorrow  Certification:: I certify this patient will need inpatient services for at least 2 midnights  PT Class (Do Not Modify): Inpatient [101]  PT Acc Code (Do Not Modify): Private [1]       Medical History Past Medical History:  Diagnosis Date  . Acute hyponatremia 06/03/2017  . Anemia   . CAP  (community acquired pneumonia) 06/03/2017  . Facial dermatitis 06/03/2017  . HIV (human immunodeficiency virus infection) ([place])    diagnosed in April 2019  . Pleurisy 06/03/2017  . 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 ([place])   . Thrush of mouth and esophagus ([place])   . UTI (urinary tract infection)     Allergies Allergies  Allergen Reactions  . Heparin Other (See Comments)    Unknown reaction    IV Location/Drains/Wounds Patient Lines/Drains/Airways Status   Active Line/Drains/Airways    Name:   Placement date:   Placement time:   Site:   Days:   Peripheral IV 10/01/18 Right;Upper Arm   10/01/18    1705    Arm   less than 1          Labs/Imaging Results for orders placed or performed during the hospital encounter of 10/01/18 (from the past 48 hour(s))  Pregnancy, urine     Status: None   Collection Time: 10/01/18  4:00 PM  Result Value Ref Range   Preg Test, Ur NEGATIVE NEGATIVE    Comment:        THE SENSITIVITY OF THIS METHODOLOGY IS >20 mIU/mL. Performed at [facility], [place] [address] [place], [place] [address]   Lactic acid, plasma     Status: None   Collection Time: 10/01/18  6:32 PM  Result Value Ref Range   Lactic Acid, Venous 1.4 0.5 - 1.9 mmol/L  Comment: Performed at [facility], [place] [address], [place], [place] [address]  Comprehensive metabolic panel     Status: Abnormal   Collection Time: 10/01/18  6:32 PM  Result Value Ref Range   Sodium 130 (L) 135 - 145 mmol/L   Potassium 4.0 3.5 - 5.1 mmol/L   Chloride 105 98 - 111 mmol/L   CO2 16 (L) 22 - 32 mmol/L   Glucose, Bld 130 (H) 70 - 99 mg/dL   BUN 34 (H) 6 - 20 mg/dL   Creatinine, Ser 1.45 (H) 0.44 - 1.00 mg/dL   Calcium 7.2 (L) 8.9 - 10.3 mg/dL   Total Protein 6.4 (L) 6.5 - 8.1 g/dL   Albumin 1.9 (L) 3.5 - 5.0 g/dL   AST 21 15 - 41 U/L   ALT 10 0 - 44 U/L   Alkaline Phosphatase 189 (H) 38 - 126 U/L   Total Bilirubin 1.9 (H) 0.3 - 1.2 mg/dL    GFR calc non Af Amer 48 (L) >60 mL/min   GFR calc Af Amer 55 (L) >60 mL/min   Anion gap 9 5 - 15    Comment: Performed at [facility], [place] [address] [place], [place] [address]  APTT     Status: Abnormal   Collection Time: 10/01/18  6:32 PM  Result Value Ref Range   aPTT 43 (H) 24 - 36 seconds    Comment:        IF BASELINE aPTT IS ELEVATED, SUGGEST PATIENT RISK ASSESSMENT BE USED TO DETERMINE APPROPRIATE ANTICOAGULANT THERAPY. Performed at [facility], [place] [address], [place], [place] [address]   Protime-INR     Status: Abnormal   Collection Time: 10/01/18  6:32 PM  Result Value Ref Range   Prothrombin Time 16.3 (H) 11.4 - 15.2 seconds   INR 1.3 (H) 0.8 - 1.2    Comment: (NOTE) INR goal varies based on device and disease states. Performed at [facility], [place] [address], [place], [place] [address]   Urinalysis, Routine w reflex microscopic     Status: Abnormal   Collection Time: 10/01/18  6:32 PM  Result Value Ref Range   Color, Urine AMBER (A) YELLOW    Comment: BIOCHEMICALS MAY BE AFFECTED BY COLOR   APPearance HAZY (A) CLEAR   Specific Gravity, Urine 1.006 1.005 - 1.030   pH 5.0 5.0 - 8.0   Glucose, UA NEGATIVE NEGATIVE mg/dL   Hgb urine dipstick NEGATIVE NEGATIVE   Bilirubin Urine NEGATIVE NEGATIVE   Ketones, ur NEGATIVE NEGATIVE mg/dL   Protein, ur NEGATIVE NEGATIVE mg/dL   Nitrite NEGATIVE NEGATIVE   Leukocytes,Ua NEGATIVE NEGATIVE    Comment: Performed at [place] [address], [place],  [address]  I-Stat beta hCG blood, ED     Status: None   Collection Time: 10/01/18  7:09 PM  Result Value Ref Range   I-stat hCG, quantitative <5.0 <5 mIU/mL   Comment 3            Comment:   GEST. AGE      CONC.  (mIU/mL)   <=1 WEEK        5 - 50     2 WEEKS       50 - 500     3 WEEKS       100 - 10,000     4 WEEKS     1,000 - 30,000        FEMALE  AND NON-PREGNANT FEMALE:     LESS  THAN 5 mIU/mL   Lactate dehydrogenase     Status: Abnormal   Collection Time: 10/01/18  8:00 PM  Result Value Ref Range   LDH 255 (H) 98 - 192 U/L    Comment: Performed at [facility], [place] [address] [place], [place] [address]   Dg Chest Port 1 View  Result Date: 10/01/2018 CLINICAL DATA:  Cough, pancytopenia EXAM: PORTABLE CHEST 1 VIEW COMPARISON:  09/19/2018 FINDINGS: Stable cardiomediastinal contours. Extensive, diffuse reticulonodular opacities throughout both lungs, markedly progressed from prior. No pleural effusion. No pneumothorax. Osseous structures intact. IMPRESSION: Extensive reticulonodular opacities throughout both lungs suggestive of an atypical infection in an immunocompromised patient. Electronically Signed   By: Davina Poke M.D.   On: 10/01/2018 18:38    Pending Labs Unresulted Labs (From admission, onward)    Start     Ordered   10/02/18 [id number]  Basic metabolic panel  Tomorrow morning,   R     10/01/18 2113   10/02/18 0500  CBC  Tomorrow morning,   R     10/01/18 2113   10/01/18 1855  SARS CORONAVIRUS 2 (TAT 6-12 HRS) Nasal Swab Aptima Multi Swab  (Asymptomatic/Tier 2 Patients Labs)  Once,   STAT    Question Answer Comment  Is this test for diagnosis or screening Screening   Symptomatic for COVID-19 as defined by CDC No   Hospitalized for COVID-19 No   Admitted to ICU for COVID-19 No   Previously tested for COVID-19 No   Resident in a congregate (group) care setting No   Employed in healthcare setting No   Pregnant No      10/01/18 1855   10/01/18 1747  Prepare RBC  (Adult Blood Administration - PRBC)  Once,   R    Question Answer Comment  # of Units 2 units   Transfusion Indications Symptomatic Anemia   Number of Units to Keep Ahead 2 units ahead   If emergent release call blood bank [facility] [id number]      10/01/18 1746   10/01/18 1745  Lactic acid, plasma  Now then every 2 hours,   STAT     10/01/18 1745   10/01/18 1745   Blood Culture (routine x 2)  BLOOD CULTURE X 2,   STAT     10/01/18 1745   10/01/18 1745  Urine culture  ONCE - STAT,   STAT     10/01/18 1745          Vitals/Pain Today's Vitals   10/01/18 2030 10/01/18 2100 10/01/18 2130 10/01/18 2200  BP: 94/65 95/65 (!) 86/64 (!) 88/59  Pulse: (!) 111 (!) 112 (!) 113 (!) 112  Resp: (!) 21 (!) 24 (!) 29 (!) 25  Temp:      TempSrc:      SpO2: 99% 99% 98% 99%  Weight:      Height:      PainSc:        Isolation Precautions No active isolations  Medications Medications  0.9 %  sodium chloride infusion (has no administration in time range)  0.9 %  sodium chloride infusion (has no administration in time range)  sodium chloride flush (NS) 0.9 % injection 3 mL (has no administration in time range)  0.9 %  sodium chloride infusion (has no administration in time range)  acetaminophen (TYLENOL) tablet 650 mg (has no administration in time range)    Or  acetaminophen (TYLENOL) suppository  650 mg (has no administration in time range)  ondansetron (ZOFRAN) tablet 4 mg (has no administration in time range)    Or  ondansetron (ZOFRAN) injection 4 mg (has no administration in time range)  cefTRIAXone (ROCEPHIN) 1 g in sodium chloride 0.9 % 100 mL IVPB (has no administration in time range)  doxycycline (VIBRAMYCIN) 100 mg in sodium chloride 0.9 % 250 mL IVPB (has no administration in time range)  atovaquone (MEPRON) 750 MG/5ML suspension 1,500 mg (has no administration in time range)  azithromycin (ZITHROMAX) tablet 1,200 mg (has no administration in time range)  bictegravir-emtricitabine-tenofovir AF (BIKTARVY) 50-200-25 MG per tablet 1 tablet (has no administration in time range)  predniSONE (DELTASONE) tablet 5 mg (has no administration in time range)  0.9 %  sodium chloride infusion (0 mLs Intravenous Stopped 10/01/18 1954)  lactated ringers bolus 1,000 mL (0 mLs Intravenous Stopped 10/01/18 1954)    And  lactated ringers bolus 500 mL (0 mLs  Intravenous Stopped 10/01/18 1954)    Mobility walks

## 2018-10-01 NOTE — Progress Notes (Signed)
This provider was asked to take the patient to the emergency room as she was being seen in the a.m. infusion today for a transfusion of packed red blood cells.  Her hemoglobin was 6.  Her blood pressure was noted to be 67/47.  The infusion room nurses were unable to get an IV started on the patient.  The IV team was called but was unable to establish IV access.  Interventional radiology could not get a PEG placed until tomorrow.  The patient was taken to the ER in hopes that a central line could be placed so that the patient could have a transfusion of packed red blood cells.  Sandi Mealy, MHS, PA-C Physician Assistant

## 2018-10-01 NOTE — Progress Notes (Signed)
A consult was received from an ED physician for vancomycin and cefepime per pharmacy dosing.  The patient's profile has been reviewed for ht/wt/allergies/indication/available labs.    A one time order has been placed for cefepime 2 g IV once + vancomycin 1000 mg IV once.    Further antibiotics/pharmacy consults should be ordered by admitting physician if indicated.                       Thank you, Lenis Noon, PharmD 10/01/2018  7:06 PM

## 2018-10-01 NOTE — Patient Instructions (Signed)
Romiplostim injection What is this medicine? ROMIPLOSTIM (roe mi PLOE stim) helps your body make more platelets. This medicine is used to treat low platelets caused by chronic idiopathic thrombocytopenic purpura (ITP). This medicine may be used for other purposes; ask your health care provider or pharmacist if you have questions. COMMON BRAND NAME(S): Nplate What should I tell my health care provider before I take this medicine? They need to know if you have any of these conditions:  bleeding disorders  bone marrow problem, like blood cancer or myelodysplastic syndrome  history of blood clots  liver disease  surgery to remove your spleen  an unusual or allergic reaction to romiplostim, mannitol, other medicines, foods, dyes, or preservatives  pregnant or trying to get pregnant  breast-feeding How should I use this medicine? This medicine is for injection under the skin. It is given by a health care professional in a hospital or clinic setting. A special MedGuide will be given to you before your injection. Read this information carefully each time. Talk to your pediatrician regarding the use of this medicine in children. While this drug may be prescribed for children as young as 1 year for selected conditions, precautions do apply. Overdosage: If you think you have taken too much of this medicine contact a poison control center or emergency room at once. NOTE: This medicine is only for you. Do not share this medicine with others. What if I miss a dose? It is important not to miss your dose. Call your doctor or health care professional if you are unable to keep an appointment. What may interact with this medicine? Interactions are not expected. This list may not describe all possible interactions. Give your health care provider a list of all the medicines, herbs, non-prescription drugs, or dietary supplements you use. Also tell them if you smoke, drink alcohol, or use illegal drugs.  Some items may interact with your medicine. What should I watch for while using this medicine? Your condition will be monitored carefully while you are receiving this medicine. Visit your prescriber or health care professional for regular checks on your progress and for the needed blood tests. It is important to keep all appointments. What side effects may I notice from receiving this medicine? Side effects that you should report to your doctor or health care professional as soon as possible:  allergic reactions like skin rash, itching or hives, swelling of the face, lips, or tongue  signs and symptoms of bleeding such as bloody or black, tarry stools; red or dark brown urine; spitting up blood or brown material that looks like coffee grounds; red spots on the skin; unusual bruising or bleeding from the eyes, gums, or nose  signs and symptoms of a blood clot such as chest pain; shortness of breath; pain, swelling, or warmth in the leg  signs and symptoms of a stroke like changes in vision; confusion; trouble speaking or understanding; severe headaches; sudden numbness or weakness of the face, arm or leg; trouble walking; dizziness; loss of balance or coordination Side effects that usually do not require medical attention (report to your doctor or health care professional if they continue or are bothersome):  headache  pain in arms and legs  pain in mouth  stomach pain This list may not describe all possible side effects. Call your doctor for medical advice about side effects. You may report side effects to FDA at 1-800-FDA-1088. Where should I keep my medicine? This drug is given in a hospital or clinic   and will not be stored at home. NOTE: This sheet is a summary. It may not cover all possible information. If you have questions about this medicine, talk to your doctor, pharmacist, or health care provider.  2020 Elsevier/Gold Standard (2017-01-22 11:10:55)  Coronavirus (COVID-19) Are  you at risk?  Are you at risk for the Coronavirus (COVID-19)?  To be considered HIGH RISK for Coronavirus (COVID-19), you have to meet the following criteria:  . Traveled to [place], [place], [place], [place] or [place]; or in the [place] to [place], [place], [place], or [place]; and have fever, cough, and shortness of breath within the last 2 weeks of travel OR . Been in close contact with a person diagnosed with COVID-19 within the last 2 weeks and have fever, cough, and shortness of breath . IF YOU DO NOT MEET THESE CRITERIA, YOU ARE CONSIDERED LOW RISK FOR COVID-19.  What to do if you are HIGH RISK for COVID-19?  . If you are having a medical emergency, call 911. . Seek medical care right away. Before you go to a doctor's office, urgent care or emergency department, call ahead and tell them about your recent travel, contact with someone diagnosed with COVID-19, and your symptoms. You should receive instructions from your physician's office regarding next steps of care.  . When you arrive at healthcare provider, tell the healthcare staff immediately you have returned from visiting [place], [place], [place], [place] or [place]; or traveled in the [place] to [place], [place], [place], or [place]; in the last two weeks or you have been in close contact with a person diagnosed with COVID-19 in the last 2 weeks.   . Tell the health care staff about your symptoms: fever, cough and shortness of breath. . After you have been seen by a medical provider, you will be either: o Tested for (COVID-19) and discharged home on quarantine except to seek medical care if symptoms worsen, and asked to  - Stay home and avoid contact with others until you get your results (4-5 days)  - Avoid travel on public transportation if possible (such as bus, train, or airplane) or o Sent to the Emergency Department by EMS for evaluation, COVID-19 testing, and possible admission depending on your  condition and test results.  What to do if you are LOW RISK for COVID-19?  Reduce your risk of any infection by using the same precautions used for avoiding the common cold or flu:  . Wash your hands often with soap and warm water for at least 20 seconds.  If soap and water are not readily available, use an alcohol-based hand sanitizer with at least 60% alcohol.  . If coughing or sneezing, cover your mouth and nose by coughing or sneezing into the elbow areas of your shirt or coat, into a tissue or into your sleeve (not your hands). . Avoid shaking hands with others and consider head nods or verbal greetings only. . Avoid touching your eyes, nose, or mouth with unwashed hands.  . Avoid close contact with people who are sick. . Avoid places or events with large numbers of people in one location, like concerts or sporting events. . Carefully consider travel plans you have or are making. . If you are planning any travel outside or inside the [place], visit the CDC's Travelers' Health webpage for the latest health notices. . If you have some symptoms but not all symptoms, continue to monitor at home and seek medical attention   if your symptoms worsen. . If you are having a medical emergency, call 911.   ADDITIONAL HEALTHCARE OPTIONS FOR PATIENTS  [facility] / e-Visit: [email or web address]         [facility]: [phone]  [facility]: [phone]                   [facility]: [phone]  

## 2018-10-01 NOTE — ED Notes (Signed)
Unable to obtain IV on this pt.  IV team consult placed, and talked with them.  IV team unable to obtain IV at [facility] center.  IV team also stated the midlines placed in the pt in the past all blew easily and she recommended an IJ.

## 2018-10-01 NOTE — Telephone Encounter (Signed)
Received call report from Pam MT.  "Today's Hgb = 6."  Note with results given to collaborative nurse.  Currently scheduled infusion at 12:45 pm for transfusion.

## 2018-10-01 NOTE — ED Provider Notes (Addendum)
[facility] DEPT Provider Note   CSN: [id number] Arrival date & time: 10/01/18  1557     History   Chief Complaint Chief Complaint  Patient presents with  . Hypotension  . low hemoglobin    HPI Krystal Short is a 33 y.o. female.     HPI  33 year old female with history of HIV AIDS, anemia and thrombocytopenia of unknown etiology comes into the ER from cancer center for low blood pressure.  Patient reports that because of her anemia she routinely gets screened by cancer center and gets transfusion.  Today they were unable to establish IV after they noted that her hemoglobin was 6, and sent her to the ER.  Patient was also noted to be hypotensive.  She informs me that typically she does get low blood pressure every time she requires blood transfusion.  Review of system is negative for any headache, neck pain.  Patient does indicate little bit of stiffness in her neck for the last 2 days.  She has a cough that is dry without any chest pain, abdominal pain, nausea, vomiting, diarrhea, new rash or joint swelling.  Patient does not think she is sick with an infection.  No COVID-19 exposures.  Past Medical History:  Diagnosis Date  . Acute hyponatremia 06/03/2017  . Anemia   . CAP (community acquired pneumonia) 06/03/2017  . Facial dermatitis 06/03/2017  . HIV (human immunodeficiency virus infection) ([place])    diagnosed in April 2019  . Pleurisy 06/03/2017  . 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 ([place])   . Thrush of mouth and esophagus ([place])   . UTI (urinary tract infection)     Patient Active Problem List   Diagnosis Date Noted  . Leukocytosis 10/01/2018  . Hypotension 10/01/2018  . Alteration in performance of activities of daily living 09/18/2018  . Anemia   . Sepsis due to pneumonia ([place]) 09/03/2018  . Pancytopenia, acquired ([place])   . Elevated bilirubin   . AKI (acute kidney injury) ([place])   . Acute respiratory failure with  hypoxemia ([place]) 08/03/2018  . Chest pain   . Fever 08/02/2018  . HCAP (healthcare-associated pneumonia) 08/02/2018  . Sepsis ([place]) 08/02/2018  . Hemophagocytosis present in bone marrow ([place])   . Pulmonary infiltrates 07/08/2018  . Hilar adenopathy 07/08/2018  . Acute respiratory failure with hypoxia ([place]) 07/04/2018  . Symptomatic anemia 06/10/2018  . Thrombocytopenia ([place]) 06/10/2018  . ASCUS with positive high risk HPV cervical 06/10/2018  . Pneumonia 05/28/2018  . Suspected pneumocystis pneumonia ([place]) 03/08/2018  . Medication monitoring encounter 12/31/2017  . Molluscum contagiosum 06/27/2017  . AIDS (acquired immune deficiency syndrome) ([place])   . Esophageal candidiasis ([place])   . Hyponatremia 06/03/2017    Past Surgical History:  Procedure Laterality Date  . BIOPSY  07/12/2018   Procedure: BIOPSY;  Surgeon: Laurin Coder, MD;  Location: [facility];  Service: Endoscopy;;  . BRONCHIAL WASHINGS  07/12/2018   Procedure: BRONCHIAL WASHINGS;  Surgeon: Laurin Coder, MD;  Location: [facility];  Service: Endoscopy;;  . ENDOBRONCHIAL ULTRASOUND N/A 07/12/2018   Procedure: ENDOBRONCHIAL ULTRASOUND;  Surgeon: Laurin Coder, MD;  Location: [facility];  Service: Endoscopy;  Laterality: N/A;  . FINE NEEDLE ASPIRATION BIOPSY  07/12/2018   Procedure: FINE NEEDLE ASPIRATION BIOPSY;  Surgeon: Laurin Coder, MD;  Location: [facility];  Service: Endoscopy;;  . NO PAST SURGERIES    . VIDEO BRONCHOSCOPY N/A 07/12/2018   Procedure: VIDEO BRONCHOSCOPY WITHOUT FLUORO;  Surgeon:  Laurin Coder, MD;  Location: [facility];  Service: Endoscopy;  Laterality: N/A;     OB History    Gravida  0   Para  0   Term  0   Preterm  0   AB  0   Living  0     SAB  0   TAB  0   Ectopic  0   Multiple  0   Live Births  0            Home Medications    Prior to Admission medications   Medication Sig Start Date End Date Taking? Authorizing Provider  atovaquone  (MEPRON) 750 MG/5ML suspension Take 10 mLs (1,500 mg total) by mouth daily. 09/18/18 10/18/18 Yes Golden Circle, FNP  azithromycin (ZITHROMAX) 600 MG tablet Take 2 tablets (1,200 mg total) by mouth once a week. Patient taking differently: Take 1,200 mg by mouth every Tuesday.  09/17/18  Yes Oswald Hillock, MD  bictegravir-emtricitabine-tenofovir AF (BIKTARVY) 50-200-25 MG TABS tablet Take 1 tablet by mouth daily. 09/18/18  Yes Golden Circle, FNP  metoprolol tartrate (LOPRESSOR) 25 MG tablet Take 0.5 tablets (12.5 mg total) by mouth 2 (two) times daily. 09/13/18  Yes Oswald Hillock, MD  predniSONE (DELTASONE) 10 MG tablet Prednisone 10 mg daily x 14 days then  Prednisone 5 mg po daily x 7 days then stop Patient taking differently: Take 5-10 mg by mouth See admin instructions. Take 10 mg daily x 14 days then 5 mg po daily x 7 days then stop 09/13/18  Yes Darrick Meigs, Marge Duncans, MD    Family History Family History  Problem Relation Age of Onset  . Healthy Father   . Healthy Mother   . Asthma Paternal Grandmother     Social History Social History   Tobacco Use  . Smoking status: Former Smoker    Packs/day: 0.25    Years: 8.00    Pack years: 2.00    Types: Cigarettes, Cigars    Quit date: 12/25/2016    Years since quitting: 1.7  . Smokeless tobacco: Never Used  Substance Use Topics  . Alcohol use: Yes    Comment: weekend/socially  . Drug use: No     Allergies   Heparin   Review of Systems Review of Systems  Constitutional: Positive for activity change and fatigue. Negative for fever.  Respiratory: Positive for cough and shortness of breath.   Cardiovascular: Negative for chest pain.  Gastrointestinal: Negative for nausea and vomiting.  Allergic/Immunologic: Positive for immunocompromised state.  Neurological: Positive for dizziness.  All other systems reviewed and are negative.    Physical Exam Updated Vital Signs BP (!) 88/59 (BP Location: Right Arm)   Pulse (!) 112   Temp  98.4 F (36.9 C) (Oral)   Resp (!) 25   Ht 5' 1.5" (1.562 m)   Wt 49.9 kg   SpO2 99%   BMI 20.45 kg/m   Physical Exam Vitals signs and nursing note reviewed.  Constitutional:      General: She is not in acute distress.    Appearance: She is well-developed. She is ill-appearing. She is not diaphoretic.  HENT:     Head: Normocephalic and atraumatic.  Eyes:     Extraocular Movements: Extraocular movements intact.     Pupils: Pupils are equal, round, and reactive to light.  Neck:     Musculoskeletal: Normal range of motion and neck supple. No neck rigidity.  Cardiovascular:  Rate and Rhythm: Tachycardia present.  Pulmonary:     Effort: Pulmonary effort is normal.     Breath sounds: No wheezing or rhonchi.  Abdominal:     General: Bowel sounds are normal.     Tenderness: There is no abdominal tenderness.  Skin:    General: Skin is warm and dry.     Findings: Rash present.  Neurological:     Mental Status: She is alert and oriented to person, place, and time.      ED Treatments / Results  Labs (all labs ordered are listed, but only abnormal results are displayed) Labs Reviewed  COMPREHENSIVE METABOLIC PANEL - Abnormal; Notable for the following components:      Result Value   Sodium 130 (*)    CO2 16 (*)    Glucose, Bld 130 (*)    BUN 34 (*)    Creatinine, Ser 1.45 (*)    Calcium 7.2 (*)    Total Protein 6.4 (*)    Albumin 1.9 (*)    Alkaline Phosphatase 189 (*)    Total Bilirubin 1.9 (*)    GFR calc non Af Amer 48 (*)    GFR calc Af Amer 55 (*)    All other components within normal limits  APTT - Abnormal; Notable for the following components:   aPTT 43 (*)    All other components within normal limits  PROTIME-INR - Abnormal; Notable for the following components:   Prothrombin Time 16.3 (*)    INR 1.3 (*)    All other components within normal limits  URINALYSIS, ROUTINE W REFLEX MICROSCOPIC - Abnormal; Notable for the following components:   Color, Urine  AMBER (*)    APPearance HAZY (*)    All other components within normal limits  LACTATE DEHYDROGENASE - Abnormal; Notable for the following components:   LDH 255 (*)    All other components within normal limits  CULTURE, BLOOD (ROUTINE X 2)  CULTURE, BLOOD (ROUTINE X 2)  URINE CULTURE  NOVEL CORONAVIRUS, NAA (HOSPITAL ORDER, SEND-OUT TO REF LAB)  LACTIC ACID, PLASMA  PREGNANCY, URINE  LACTIC ACID, PLASMA  BASIC METABOLIC PANEL  CBC  I-STAT BETA HCG BLOOD, ED ([facility], [facility], [facility] ONLY)  PREPARE RBC (CROSSMATCH)    EKG None  Radiology Dg Chest Port 1 View  Result Date: 10/01/2018 CLINICAL DATA:  Cough, pancytopenia EXAM: PORTABLE CHEST 1 VIEW COMPARISON:  09/19/2018 FINDINGS: Stable cardiomediastinal contours. Extensive, diffuse reticulonodular opacities throughout both lungs, markedly progressed from prior. No pleural effusion. No pneumothorax. Osseous structures intact. IMPRESSION: Extensive reticulonodular opacities throughout both lungs suggestive of an atypical infection in an immunocompromised patient. Electronically Signed   By: Davina Poke M.D.   On: 10/01/2018 18:38    Procedures .Critical Care Performed by: Varney Biles, MD Authorized by: Varney Biles, MD   Critical care provider statement:    Critical care time (minutes):  36   Critical care was necessary to treat or prevent imminent or life-threatening deterioration of the following conditions:  Circulatory failure   Critical care was time spent personally by me on the following activities:  Discussions with consultants, evaluation of patient's response to treatment, examination of patient, ordering and performing treatments and interventions, ordering and review of laboratory studies, ordering and review of radiographic studies, pulse oximetry, re-evaluation of patient's condition, obtaining history from patient or surrogate and review of old charts   (including critical care time)  Medications Ordered in ED  Medications  0.9 %  sodium chloride  infusion (has no administration in time range)  0.9 %  sodium chloride infusion (has no administration in time range)  sodium chloride flush (NS) 0.9 % injection 3 mL (has no administration in time range)  0.9 %  sodium chloride infusion (has no administration in time range)  acetaminophen (TYLENOL) tablet 650 mg (has no administration in time range)    Or  acetaminophen (TYLENOL) suppository 650 mg (has no administration in time range)  ondansetron (ZOFRAN) tablet 4 mg (has no administration in time range)    Or  ondansetron (ZOFRAN) injection 4 mg (has no administration in time range)  cefTRIAXone (ROCEPHIN) 1 g in sodium chloride 0.9 % 100 mL IVPB (has no administration in time range)  doxycycline (VIBRAMYCIN) 100 mg in sodium chloride 0.9 % 250 mL IVPB (has no administration in time range)  atovaquone (MEPRON) 750 MG/5ML suspension 1,500 mg (has no administration in time range)  azithromycin (ZITHROMAX) tablet 1,200 mg (has no administration in time range)  bictegravir-emtricitabine-tenofovir AF (BIKTARVY) 50-200-25 MG per tablet 1 tablet (has no administration in time range)  predniSONE (DELTASONE) tablet 5 mg (has no administration in time range)  0.9 %  sodium chloride infusion (0 mLs Intravenous Stopped 10/01/18 1954)  lactated ringers bolus 1,000 mL (0 mLs Intravenous Stopped 10/01/18 1954)    And  lactated ringers bolus 500 mL (0 mLs Intravenous Stopped 10/01/18 1954)     Initial Impression / Assessment and Plan / ED Course  I have reviewed the triage vital signs and the nursing notes.  Pertinent labs & imaging results that were available during my care of the patient were reviewed by me and considered in my medical decision making (see chart for details).  Clinical Course as of Oct 01 2230  Tue Oct 01, 2018  1905 X-ray results reviewed at 1700.  Patient has AIDS based on her last CD4 count less than 100.  I will consult infectious disease.   I think it would be best to start her on cefepime and azithromycin in the interim.  Patient has been made aware that she will need admission to the hospital.  I have advised nursing staff to start antibiotics before blood cultures if needed.  DG Chest Port 1 View [AN]    Clinical Course User Index [AN] Varney Biles, MD       33 year old comes in a chief complaint of low blood pressure and low hemoglobin. She has HIV AIDS and has been taking her medications as prescribed.  She goes to the cancer center because of her anemia and thrombocytopenia -often requiring transfusions.  They were unable to establish IV and patient's blood pressure was low therefore she was sent to the ER.  In the ED patient has hypertension.  Nursing staff was able to establish single IV. Initial impression is that patient likely is hypotensive because of her anemia.  She denies any vomiting, diarrhea, fevers.  She has a cough, but it is not new.  Shortness of breath is likely because of her acute anemia.  Plan is to start work-up.  Code sepsis although initially activated has been canceled as history is suggestive more of symptoms due to her severe anemia.  8:30 PM:  I discussed the case with Dr. Novella Olive. We reviewed patient's x-rays and prior CT scans.  I also discussed with him that patient is afebrile, but having some cough and has elevated white count with the nonspecific findings on chest x-ray.  I was wondering if we should be aggressive  and give patient broad-spectrum antibiotics with low suspicion for pneumonia versus being conservative.  Dr. Guerry Bruin recommends that we give patient IV doxy and be conservative for now.  We had cared for with that recommendation and admitted patient to medicine.  As such, I do not think patient is in septic shock. Hemodynamic reassessment completed at this time.  BP has improved, we will not start pressors.  Final Clinical Impressions(s) / ED Diagnoses   Final diagnoses:   Symptomatic anemia    ED Discharge Orders    None         Varney Biles, MD 10/01/18 [id number]    Varney Biles, MD 10/01/18 2234

## 2018-10-01 NOTE — Progress Notes (Signed)
This nurse called and spoke with Watt Climes RN regarding consult placed. This patient is known to [facility]. Upon assessment she was found to have deep small veins. Unable to place PIV at this time d/t poor vasculature. Recommended nurse to notify MD regarding line placement. VU. Fran Lowes, RN [facility]

## 2018-10-01 NOTE — ED Triage Notes (Signed)
Pt from cancer with hx of pancytopenia.  Pts BP was 69/49 in the [facility] center, and had a hgb of 6.  Pt was scheduled for a blood transfusion today.  IV team unable to get line today.  PA at cancer center mention pt needing a PICC line.

## 2018-10-01 NOTE — Progress Notes (Signed)
IV team nurse unable to start IV after 3 attempts with ultrasound machine. Given pt's hgb level and low BP it was decided to get pt admitted to ED. Nplate injection administered at 15:15. [place] Client assisting with referral and transfer. Pt updated on plan and agreeable--Will continue to monitor

## 2018-10-01 NOTE — H&P (Signed)
History and Physical    Krystal Short DPO:[id number] DOB: 1985-08-16 DOA: 10/01/2018  PCP: Lajean Manes, MD  Patient coming from: Oncology office  I have personally briefly reviewed patient's old medical records in [place]  Chief Complaint: Shortness of breath, anemia  HPI: Krystal Short is a 33 y.o. female with medical history significant for advanced HIV (CD4 58 09/09/2018, viral RNA <20 08/15/2018 on Biktarvy, azithromycin, and atovaquone), normocytic anemia, thrombocytopenia, and recent admission for sepsis due to suspected pneumocystis jirovecii pneumonia who presents to the ED from the cancer center for evaluation of symptomatic anemia, hypotension, and shortness of breath.  Patient reports on and off symptoms of lightheadedness, generalized weakness, shortness of breath, nonproductive cough over the last 3-4 days.  She reports a single episode of fever with diaphoresis.  She denies any loss of consciousness.  She denies any chest pain, nausea, vomiting, abdominal pain, diarrhea, or dysuria.  She denies any obvious bleeding including epistaxis, hemoptysis, hematemesis, hematochezia, melena, hematuria.  She denies any menstrual bleeding as she is on birth control.  She went to the cancer center for routine Nplate injection and transfusion of red blood cells.  She was noted to be hypotensive with SBP in the 60s and they were unable to place a peripheral IV.  Hemoglobin was 6.0 with platelets 39,000.  New leukocytosis noted at 16.1.  She was therefore sent to the ED for possible need of central line and blood transfusion.  Patient has multiple recent admissions:  Admitted 07/04/2018-07/16/2018 with atypical infection with Bordetella bronchus septic versus sarcoid.  Underwent bronchoscopy 07/12/2018, biopsy "scant lung parenchyma with fibrosis and reactive changes.  No granulomata identified.".  Required transfusion 4 units PRBCs, 3 units platelets.  Admitted from 08/01/2018-08/22/2018  which time she was treated for sepsis from suspected pneumocystis pneumonia.  She was treated with steroids, clindamycin, and primaquine.  Hospitalization was complicated by anemia and thrombocytopenia requiring transfusion of 8 units PRBCs and 7 units platelets.  COVID-19 swab was negative, respiratory viral panel was positive for coronavirus 229 E.    Admitted 09/03/2018-09/13/2018 for generalized weakness and deconditioning in setting of anemia and thrombocytopenia.  Required transfusion 3 units PRBCs, 8 units platelets.  Completed IVIG x2.  Continued on prednisone taper.   ED Course:  Initial vitals showed BP 73/49, pulse 112, RR 24, temp 98.4 Fahrenheit, SPO2 100% on room air.  Labs notable for sodium 130, potassium 4.0, bicarb 16, BUN 34, creatinine 1.45, serum glucose 130, albumin 1.9, AST 21, ALT 10, alk phos 189, total bilirubin 1.9, lactic acid 1.4, beta-hCG negative, LDH 255.  SARS-CoV-2 test was obtained and pending.  Portable chest x-ray showed diffuse reticulonodular opacities throughout both lungs, progressed from prior.   Peripheral IV was obtained in the ED.  Patient was given 1.5 L LR and 1 L normal saline.  EDP discussed the case with on-call ID who recommended against aggressive broad-spectrum antibiotics and code sepsis was called off.  Blood cultures were obtained and pending.  Patient was ordered to receive IV doxycycline and transfusion of 2 units PRBCs.  The hospitalist service was consulted to admit for further evaluation and management.   Review of Systems: All systems reviewed and are negative except as documented in history of present illness above.   Past Medical History:  Diagnosis Date  . Acute hyponatremia 06/03/2017  . Anemia   . CAP (community acquired pneumonia) 06/03/2017  . Facial dermatitis 06/03/2017  . HIV (human immunodeficiency virus infection) ([place])    diagnosed  in April 2019  . Pleurisy 06/03/2017  . Pneumonia of both lungs due to Pneumocystis  jirovecii ([place])   . Thrush of mouth and esophagus ([place])   . UTI (urinary tract infection)     Past Surgical History:  Procedure Laterality Date  . BIOPSY  07/12/2018   Procedure: BIOPSY;  Surgeon: Laurin Coder, MD;  Location: [facility];  Service: Endoscopy;;  . BRONCHIAL WASHINGS  07/12/2018   Procedure: BRONCHIAL WASHINGS;  Surgeon: Laurin Coder, MD;  Location: [facility];  Service: Endoscopy;;  . ENDOBRONCHIAL ULTRASOUND N/A 07/12/2018   Procedure: ENDOBRONCHIAL ULTRASOUND;  Surgeon: Laurin Coder, MD;  Location: [facility];  Service: Endoscopy;  Laterality: N/A;  . FINE NEEDLE ASPIRATION BIOPSY  07/12/2018   Procedure: FINE NEEDLE ASPIRATION BIOPSY;  Surgeon: Laurin Coder, MD;  Location: [facility];  Service: Endoscopy;;  . NO PAST SURGERIES    . VIDEO BRONCHOSCOPY N/A 07/12/2018   Procedure: VIDEO BRONCHOSCOPY WITHOUT FLUORO;  Surgeon: Laurin Coder, MD;  Location: [facility];  Service: Endoscopy;  Laterality: N/A;    Social History:  reports that she quit smoking about 21 months ago. Her smoking use included cigarettes and cigars. She has a 2.00 pack-year smoking history. She has never used smokeless tobacco. She reports current alcohol use. She reports that she does not use drugs.  Allergies  Allergen Reactions  . Heparin Other (See Comments)    Unknown reaction    Family History  Problem Relation Age of Onset  . Healthy Father   . Healthy Mother   . Asthma Paternal Grandmother      Prior to Admission medications   Medication Sig Start Date End Date Taking? Authorizing Provider  atovaquone (MEPRON) 750 MG/5ML suspension Take 10 mLs (1,500 mg total) by mouth daily. 09/18/18 10/18/18 Yes Golden Circle, FNP  azithromycin (ZITHROMAX) 600 MG tablet Take 2 tablets (1,200 mg total) by mouth once a week. Patient taking differently: Take 1,200 mg by mouth every Tuesday.  09/17/18  Yes Oswald [place], MD  bictegravir-emtricitabine-tenofovir AF  (BIKTARVY) 50-200-25 MG TABS tablet Take 1 tablet by mouth daily. 09/18/18  Yes Golden Circle, FNP  metoprolol tartrate (LOPRESSOR) 25 MG tablet Take 0.5 tablets (12.5 mg total) by mouth 2 (two) times daily. 09/13/18  Yes Oswald Hillock, MD  predniSONE (DELTASONE) 10 MG tablet Prednisone 10 mg daily x 14 days then  Prednisone 5 mg po daily x 7 days then stop Patient taking differently: Take 5-10 mg by mouth See admin instructions. Take 10 mg daily x 14 days then 5 mg po daily x 7 days then stop 09/13/18  Yes Lama, Marge Duncans, MD  ondansetron (ZOFRAN) 4 MG tablet Take 1 tablet (4 mg total) by mouth every 6 (six) hours. Patient not taking: Reported on 09/18/2018 09/13/18   Oswald Hillock, MD  polyethylene glycol (MIRALAX / GLYCOLAX) 17 g packet Take 17 g by mouth daily as needed for mild constipation. Patient not taking: Reported on 09/18/2018 09/13/18   Oswald Hillock, MD    Physical Exam: Vitals:   10/01/18 2030 10/01/18 2100 10/01/18 2130 10/01/18 2200  BP: 94/65 95/65 (!) 86/64 (!) 88/59  Pulse: (!) 111 (!) 112 (!) 113 (!) 112  Resp: (!) 21 (!) 24 (!) 29 (!) 25  Temp:      TempSrc:      SpO2: 99% 99% 98% 99%  Weight:      Height:       Constitutional: Resting  in bed supine, NAD, calm, appears tired but comfortable Eyes: PERRL, lids and conjunctivae normal ENMT: Mucous membranes are moist. Posterior pharynx clear of any exudate or lesions. Neck: normal, supple, no masses. Respiratory: clear to auscultation bilaterally, no wheezing, no crackles. Normal respiratory effort. No accessory muscle use.  Cardiovascular: Tachycardic, no murmurs / rubs / gallops. No extremity edema. 2+ pedal pulses. Abdomen: no tenderness, no masses palpated. No hepatosplenomegaly. Bowel sounds positive.  Musculoskeletal: no clubbing / cyanosis. No joint deformity upper and lower extremities. Good ROM, no contractures. Normal muscle tone.  Skin: Molluscum contagiosum rash noted on face. No induration Neurologic: CN 2-12  grossly intact. Sensation intact, Strength 5/5 in all 4 extremity however slightly decreased with left lower extremity.  Psychiatric: Normal judgment and insight. Alert and oriented x 3. Normal mood.   Labs on Admission: I have personally reviewed following labs and imaging studies  CBC: Recent Labs  Lab 10/01/18 1219  WBC 16.1*  NEUTROABS 13.0  HGB 6.0*  HCT 18.8*  MCV 93.5  PLT 39*   Basic Metabolic Panel: Recent Labs  Lab 10/01/18 1832  NA 130*  K 4.0  CL 105  CO2 16*  GLUCOSE 130*  BUN 34*  CREATININE 1.45*  CALCIUM 7.2*   GFR: Estimated Creatinine Clearance: 43.5 mL/min (A) (by C-G formula based on SCr of 1.45 mg/dL (H)). Liver Function Tests: Recent Labs  Lab 10/01/18 1832  AST 21  ALT 10  ALKPHOS 189*  BILITOT 1.9*  PROT 6.4*  ALBUMIN 1.9*   No results for input(s): LIPASE, AMYLASE in the last 168 hours. No results for input(s): AMMONIA in the last 168 hours. Coagulation Profile: Recent Labs  Lab 10/01/18 1832  INR 1.3*   Cardiac Enzymes: No results for input(s): CKTOTAL, CKMB, CKMBINDEX, TROPONINI in the last 168 hours. BNP (last 3 results) No results for input(s): PROBNP in the last 8760 hours. HbA1C: No results for input(s): HGBA1C in the last 72 hours. CBG: No results for input(s): GLUCAP in the last 168 hours. Lipid Profile: No results for input(s): CHOL, HDL, LDLCALC, TRIG, CHOLHDL, LDLDIRECT in the last 72 hours. Thyroid Function Tests: No results for input(s): TSH, T4TOTAL, FREET4, T3FREE, THYROIDAB in the last 72 hours. Anemia Panel: No results for input(s): VITAMINB12, FOLATE, FERRITIN, TIBC, IRON, RETICCTPCT in the last 72 hours. Urine analysis:    Component Value Date/Time   COLORURINE AMBER (A) 10/01/2018 1832   APPEARANCEUR HAZY (A) 10/01/2018 1832   LABSPEC 1.006 10/01/2018 1832   PHURINE 5.0 10/01/2018 1832   GLUCOSEU NEGATIVE 10/01/2018 1832   HGBUR NEGATIVE 10/01/2018 1832   BILIRUBINUR NEGATIVE 10/01/2018 1832    KETONESUR NEGATIVE 10/01/2018 1832   PROTEINUR NEGATIVE 10/01/2018 1832   UROBILINOGEN 1.0 12/03/2011 0550   NITRITE NEGATIVE 10/01/2018 1832   LEUKOCYTESUR NEGATIVE 10/01/2018 1832    Radiological Exams on Admission: Dg Chest Port 1 View  Result Date: 10/01/2018 CLINICAL DATA:  Cough, pancytopenia EXAM: PORTABLE CHEST 1 VIEW COMPARISON:  09/19/2018 FINDINGS: Stable cardiomediastinal contours. Extensive, diffuse reticulonodular opacities throughout both lungs, markedly progressed from prior. No pleural effusion. No pneumothorax. Osseous structures intact. IMPRESSION: Extensive reticulonodular opacities throughout both lungs suggestive of an atypical infection in an immunocompromised patient. Electronically Signed   By: Davina Poke M.D.   On: 10/01/2018 18:38    EKG: Independently reviewed. Sinus tachycardia with low voltage, not significantly changed from prior.  Assessment/Plan Principal Problem:   Symptomatic anemia Active Problems:   AIDS (acquired immune deficiency syndrome) ([facility])   Pneumonia  Thrombocytopenia ([place])   Leukocytosis   Hypotension  Krystal Short is a 33 y.o. female with medical history significant for advanced HIV (CD4 58 09/09/2018, viral RNA <20 08/15/2018 on Biktarvy, azithromycin, and atovaquone), normocytic anemia, thrombocytopenia, and recent admission for sepsis due to suspected pneumocystis jirovecii pneumonia who is admitted with symptomatic anemia.   Symptomatic anemia with thrombocytopenia: Bone biopsy 09/11/2018 showed hypercellular bone marrow for age with trilineage hematopoiesis, peripheral blood normocytic normochromic anemia, neutrophilic left shift, thrombocytopenia.  Recent admissions requiring multiple transfusions of PRBCs and platelets.  Also received IVIG on past admission. -Hemoglobin 6.0 at [facility], order to transfuse 2 units PRBCs -Platelets 39,000, received Nplate injection day of admission 10/01/2018 -Patient denies any obvious  bleeding -Follow-up CBC posttransfusion, if labs and symptoms not improving may need further hematology input -Continue prednisone taper 5 mg x 3 more days.  Bilateral airspace opacities, question atypical pneumonia: Chest x-ray changes appear progressed from prior admissions.  Has been treated for suspected pneumocystis jirovecii pneumonia on prior admission with primaquine and clindamycin. -Will continue with IV ceftriaxone and doxycycline for now, de-escalate as able -Supplemental oxygen as needed -Follow-up blood cultures -SARS-CoV-2 test pending, continue PUI precautions -Prior blood, fungal, bone marrow cultures are negative.   -Prior bone marrow acid-fast smear is negative.   -Negative respiratory viral panel 09/03/2018, positive coronavirus 229E on respiratory viral panel 08/02/2018 -SARS-CoV-2 test negative 09/03/2018, 08/01/2018, and 07/30/2018 -bronchoscopy 07/12/2018 showed scant lung parenchyma with fibrosis and reactive changes.  No granulomata identified. Few Bordetella bronchiseptica were grown on culture. -Legionella and strep pneumo negative 09/03/2018  Advanced HIV: CD4 58 09/09/2018, viral RNA <20 08/15/2018.  Patient reports adherence to home medications. -Continue Biktarvy, atovaquone, and weekly azithromycin  Hypotension: Improving with IV fluids however remains hypotensive and symptomatic. -Continue IV fluid resuscitation and blood transfusion -Hold home metoprolol  Acute kidney injury: Likely prerenal from anemia and hypotension. -Continue IV fluids and repeat labs in a.m.  Generalized weakness: Suspect secondary to deconditioning and anemia.  Continue fluids, blood transfusion as above.  PT eval.   DVT prophylaxis: SCDs Code Status: Full code, confirmed with patient Family Communication: Discussed with husband at bedside Disposition Plan: Pending clinical progress. Consults called: EDP discussed with ID Admission status: Admit - It is my clinical opinion that  admission to INPATIENT is reasonable and necessary because of the expectation that this patient will require hospital care that crosses at least 2 midnights to treat this condition based on the medical complexity of the problems presented.  Given the aforementioned information, the predictability of an adverse outcome is felt to be significant.      Zada Finders MD [facility]  If 7PM-7AM, please contact night-coverage [email or web address]  10/01/2018, 10:06 PM

## 2018-10-01 NOTE — Progress Notes (Signed)
10/01/18  Conformed today's dose of NPlate to be D34-534 mcg (5 mcg/Kg).  T.O. Dr Joette Catching RN/Shahida Schnackenberg Ronnald Ramp, PharmD

## 2018-10-02 ENCOUNTER — Telehealth: Payer: Self-pay | Admitting: Medical

## 2018-10-02 ENCOUNTER — Inpatient Hospital Stay (HOSPITAL_COMMUNITY): Payer: 59

## 2018-10-02 DIAGNOSIS — D649 Anemia, unspecified: Secondary | ICD-10-CM

## 2018-10-02 LAB — CBC WITH DIFFERENTIAL/PLATELET
Abs Immature Granulocytes: 0.5 10*3/uL — ABNORMAL HIGH (ref 0.00–0.07)
Band Neutrophils: 8 %
Basophils Absolute: 0 10*3/uL (ref 0.0–0.1)
Basophils Relative: 0 %
Eosinophils Absolute: 0 10*3/uL (ref 0.0–0.5)
Eosinophils Relative: 0 %
HCT: 35.5 % — ABNORMAL LOW (ref 36.0–46.0)
Hemoglobin: 10.8 g/dL — ABNORMAL LOW (ref 12.0–15.0)
Lymphocytes Relative: 5 %
Lymphs Abs: 0.9 10*3/uL (ref 0.7–4.0)
MCH: 30.1 pg (ref 26.0–34.0)
MCHC: 30.4 g/dL (ref 30.0–36.0)
MCV: 98.9 fL (ref 80.0–100.0)
Metamyelocytes Relative: 1 %
Monocytes Absolute: 1.8 10*3/uL — ABNORMAL HIGH (ref 0.1–1.0)
Monocytes Relative: 10 %
Myelocytes: 2 %
Neutro Abs: 14.7 10*3/uL — ABNORMAL HIGH (ref 1.7–7.7)
Neutrophils Relative %: 74 %
Platelets: 30 10*3/uL — ABNORMAL LOW (ref 150–400)
RBC: 3.59 MIL/uL — ABNORMAL LOW (ref 3.87–5.11)
RDW: 18.3 % — ABNORMAL HIGH (ref 11.5–15.5)
WBC: 17.9 10*3/uL — ABNORMAL HIGH (ref 4.0–10.5)
nRBC: 0.4 % — ABNORMAL HIGH (ref 0.0–0.2)

## 2018-10-02 LAB — HEMOGLOBIN AND HEMATOCRIT, BLOOD
HCT: 33.5 % — ABNORMAL LOW (ref 36.0–46.0)
Hemoglobin: 10.6 g/dL — ABNORMAL LOW (ref 12.0–15.0)

## 2018-10-02 LAB — PROCALCITONIN: Procalcitonin: 12.26 ng/mL

## 2018-10-02 LAB — FERRITIN: Ferritin: 9379 ng/mL — ABNORMAL HIGH (ref 11–307)

## 2018-10-02 LAB — C-REACTIVE PROTEIN: CRP: 21.8 mg/dL — ABNORMAL HIGH (ref <1.0)

## 2018-10-02 LAB — BASIC METABOLIC PANEL
Anion gap: 7 (ref 5–15)
BUN: 26 mg/dL — ABNORMAL HIGH (ref 6–20)
CO2: 16 mmol/L — ABNORMAL LOW (ref 22–32)
Calcium: 7.5 mg/dL — ABNORMAL LOW (ref 8.9–10.3)
Chloride: 112 mmol/L — ABNORMAL HIGH (ref 98–111)
Creatinine, Ser: 1.09 mg/dL — ABNORMAL HIGH (ref 0.44–1.00)
GFR calc Af Amer: >60 mL/min (ref >60)
GFR calc non Af Amer: >60 mL/min (ref >60)
Glucose, Bld: 101 mg/dL — ABNORMAL HIGH (ref 70–99)
Potassium: 4 mmol/L (ref 3.5–5.1)
Sodium: 135 mmol/L (ref 135–145)

## 2018-10-02 LAB — URINE CULTURE: Culture: <10000 — AB

## 2018-10-02 LAB — RETICULOCYTES
Immature Retic Fract: 11.4 % (ref 2.3–15.9)
RBC.: 3.59 MIL/uL — ABNORMAL LOW (ref 3.87–5.11)
Retic Count, Absolute: 41.6 10*3/uL (ref 19.0–186.0)
Retic Ct Pct: 1.2 % (ref 0.4–3.1)

## 2018-10-02 LAB — LACTATE DEHYDROGENASE: LDH: 352 U/L — ABNORMAL HIGH (ref 98–192)

## 2018-10-02 LAB — SEDIMENTATION RATE: Sed Rate: 100 mm/hr — ABNORMAL HIGH (ref 0–22)

## 2018-10-02 LAB — MRSA PCR SCREENING: MRSA by PCR: NEGATIVE

## 2018-10-02 LAB — D-DIMER, QUANTITATIVE: D-Dimer, Quant: 2.27 ug/mL-FEU — ABNORMAL HIGH (ref 0.00–0.50)

## 2018-10-02 LAB — BRAIN NATRIURETIC PEPTIDE: B Natriuretic Peptide: 279 pg/mL — ABNORMAL HIGH (ref 0.0–100.0)

## 2018-10-02 MED ORDER — HYDROCODONE-ACETAMINOPHEN 5-325 MG PO TABS
1.0000 | ORAL_TABLET | Freq: Four times a day (QID) | ORAL | Status: DC | PRN
  Administered 2018-10-02 – 2018-10-04 (×2): 1 via ORAL
  Filled 2018-10-02 (×2): qty 1

## 2018-10-02 MED ORDER — DOXYCYCLINE HYCLATE 100 MG PO TABS
100.0000 mg | ORAL_TABLET | Freq: Two times a day (BID) | ORAL | Status: DC
  Administered 2018-10-02 – 2018-10-04 (×5): 100 mg via ORAL
  Filled 2018-10-02 (×5): qty 1

## 2018-10-02 MED ORDER — SODIUM CHLORIDE 0.9 % IV SOLN
250.0000 mL | INTRAVENOUS | Status: DC
  Administered 2018-10-02: 250 mL via INTRAVENOUS

## 2018-10-02 MED ORDER — FUROSEMIDE 10 MG/ML IJ SOLN
INTRAMUSCULAR | Status: AC
  Filled 2018-10-02: qty 4

## 2018-10-02 MED ORDER — METHYLPREDNISOLONE SODIUM SUCC 40 MG IJ SOLR
40.0000 mg | Freq: Two times a day (BID) | INTRAMUSCULAR | Status: DC
  Administered 2018-10-02 (×2): 40 mg via INTRAVENOUS
  Filled 2018-10-02 (×3): qty 1

## 2018-10-02 MED ORDER — FUROSEMIDE 10 MG/ML IJ SOLN
20.0000 mg | Freq: Once | INTRAMUSCULAR | Status: AC
  Administered 2018-10-02: 20 mg via INTRAVENOUS

## 2018-10-02 MED ORDER — NOREPINEPHRINE 4 MG/250ML-% IV SOLN
2.0000 ug/min | INTRAVENOUS | Status: DC
  Administered 2018-10-02: 2 ug/min via INTRAVENOUS
  Filled 2018-10-02: qty 250

## 2018-10-02 MED ORDER — BENZONATATE 100 MG PO CAPS
100.0000 mg | ORAL_CAPSULE | Freq: Three times a day (TID) | ORAL | Status: DC
  Administered 2018-10-02 (×2): 100 mg via ORAL
  Filled 2018-10-02 (×6): qty 1

## 2018-10-02 MED ORDER — DM-GUAIFENESIN ER 30-600 MG PO TB12
1.0000 | ORAL_TABLET | Freq: Two times a day (BID) | ORAL | Status: DC
  Administered 2018-10-02 – 2018-10-03 (×3): 1 via ORAL
  Filled 2018-10-02 (×5): qty 1

## 2018-10-02 NOTE — Evaluation (Signed)
Physical Therapy Evaluation Patient Details Name: Krystal Short MRN: [id number] DOB: 1985-05-30 Today's Date: 10/02/2018   History of Present Illness  33 yo female admitted with anemia, atypical Pna. COVID test pending 8/26. Hx of AIDS, pneumocystis, anemia  Clinical Impression  On eval, pt required Min assist for mobility. She was able to walk ~15 feet x 2 around the room (covid test still pending at time of eval). She is weak and fatigues easily. Dyspnea 3/4 with activity. SpO2 97% on RA. Discussed d/c plan-pt plans to return home with family assisting as needed. She may decide to decline HHPT f/u but I have recommended it in case she is agreeable. Will follow and progress activity as tolerated.     Follow Up Recommendations Home health PT;Supervision for mobility/OOB(depending on progress and if pt is agreeable)    Equipment Recommendations  None recommended by PT    Recommendations for Other Services       Precautions / Restrictions Precautions Precautions: Fall Restrictions Weight Bearing Restrictions: No      Mobility  Bed Mobility Overal bed mobility: Needs Assistance Bed Mobility: Supine to Sit;Sit to Supine     Supine to sit: Supervision;HOB elevated Sit to supine: Supervision;HOB elevated   General bed mobility comments: for safety, lines  Transfers Overall transfer level: Needs assistance Equipment used: Rolling walker (2 wheeled);None Transfers: Sit to/from Stand Sit to Stand: Min guard         General transfer comment: Close guard for safety. VCs hand placement  Ambulation/Gait Ambulation/Gait assistance: Min assist Gait Distance (Feet): 15 Feet(x2) Assistive device: Rolling walker (2 wheeled);IV Pole Gait Pattern/deviations: Step-through pattern;Decreased stride length     General Gait Details: Unsteady. Pt also fatigues fairly easily. Dyspnea 3/4. SpO2 97% on RA. Seated rest break between walks.  Stairs            Wheelchair Mobility     Modified Rankin (Stroke Patients Only)       Balance Overall balance assessment: Needs assistance           Standing balance-Leahy Scale: Poor                               Pertinent Vitals/Pain Pain Assessment: No/denies pain    Home Living Family/patient expects to be discharged to:: Private residence Living Arrangements: Spouse/significant other Available Help at Discharge: Family Type of Home: Mobile home Home Access: Stairs to enter Entrance Stairs-Rails: Left Entrance Stairs-Number of Steps: 4 Home Layout: One level Home Equipment: None      Prior Function Level of Independence: Independent         Comments: at times spouse has to help with IADL's     Hand Dominance        Extremity/Trunk Assessment   Upper Extremity Assessment Upper Extremity Assessment: Generalized weakness    Lower Extremity Assessment Lower Extremity Assessment: Generalized weakness    Cervical / Trunk Assessment Cervical / Trunk Assessment: Normal  Communication   Communication: No difficulties  Cognition Arousal/Alertness: Awake/alert Behavior During Therapy: WFL for tasks assessed/performed Overall Cognitive Status: Within Functional Limits for tasks assessed                                        General Comments      Exercises     Assessment/Plan    PT Assessment Patient  needs continued PT services  PT Problem List Decreased strength;Decreased mobility;Decreased activity tolerance;Decreased balance;Decreased knowledge of use of DME       PT Treatment Interventions      PT Goals (Current goals can be found in the Care Plan section)  Acute Rehab PT Goals Patient Stated Goal: to get better/stronger PT Goal Formulation: With patient Time For Goal Achievement: 10/16/18 Potential to Achieve Goals: Good    Frequency Min 3X/week   Barriers to discharge        Co-evaluation               AM-PAC PT "6 Clicks"  Mobility  Outcome Measure Help needed turning from your back to your side while in a flat bed without using bedrails?: A Little Help needed moving from lying on your back to sitting on the side of a flat bed without using bedrails?: A Little Help needed moving to and from a bed to a chair (including a wheelchair)?: A Little Help needed standing up from a chair using your arms (e.g., wheelchair or bedside chair)?: A Little Help needed to walk in hospital room?: A Little Help needed climbing 3-5 steps with a railing? : A Little 6 Click Score: 18    End of Session Equipment Utilized During Treatment: Gait belt Activity Tolerance: Patient limited by fatigue Patient left: in bed;with call bell/phone within reach;with bed alarm set   PT Visit Diagnosis: Unsteadiness on feet (R26.81);Muscle weakness (generalized) (M62.81)    Time: [id number] PT Time Calculation (min) (ACUTE ONLY): 37 min   Charges:   PT Evaluation $PT Eval Moderate Complexity: 1 Mod PT Treatments $Gait Training: 8-22 mins          Weston Anna, PT Acute Rehabilitation Services Pager: [phone] Office: [phone]

## 2018-10-02 NOTE — Telephone Encounter (Signed)
No los per 8/25. °

## 2018-10-02 NOTE — Progress Notes (Addendum)
[facility] Progress Note  Patient: Krystal Short [id number]   PCP: Lajean Manes, MD DOB: 05-02-1985   DOA: 10/01/2018   DOS: 10/02/2018   Date of Service: the patient was seen and examined on 10/02/2018  Brief hospital course: Pt. with PMH of advanced HIV (CD4 58 09/09/2018, viral RNA <20 08/15/2018 on Biktarvy, azithromycin, and atovaquone), normocytic anemia, thrombocytopenia, and recent admission for sepsis due to suspected pneumocystis jirovecii pneumonia; admitted on 10/01/2018, presented with complaint of cough and shortness of breath, was found to have acute pneumonia. Currently further plan is continue current medication.  Subjective: Continues to have shortness of breath.  No nausea no vomiting.  No fever no chills.  No chest pain.  No fever reported.  On 2 LPM.  Assessment and Plan: Symptomatic anemia with thrombocytopenia: Bone biopsy 09/11/2018 showed hypercellular bone marrow for age with trilineage hematopoiesis, peripheral blood normocytic normochromic anemia, neutrophilic left shift, thrombocytopenia.  Recent admissions requiring multiple transfusions of PRBCs and platelets.  Also received IVIG on past admission. -Hemoglobin 6.0 at [place], order to transfuse 2 units PRBCs -Platelets 39,000, received Nplate injection day of admission 10/01/2018 -Patient denies any obvious bleeding -Follow-up CBC posttransfusion, if labs and symptoms not improving may need further hematology input -Change prednisone taper to IV Solu-Medrol for now.  Bilateral airspace opacities, question atypical pneumonia: Septic shock requiring pressors. Chest x-ray changes appear progressed from prior admissions.  Has been treated for suspected pneumocystis jirovecii pneumonia on prior admission with primaquine and clindamycin. -Will continue with IV ceftriaxone and doxycycline for now, de-escalate as able -Supplemental oxygen as needed -Follow-up blood cultures -SARS-CoV-2 test pending,  continue PUI precautions -Prior blood, fungal, bone marrow cultures are negative.   -Prior bone marrow acid-fast smear is negative.   -Negative respiratory viral panel 09/03/2018, positive coronavirus 229E on respiratory viral panel 08/02/2018 -SARS-CoV-2 test negative 09/03/2018, 08/01/2018, and 07/30/2018 -bronchoscopy 07/12/2018 showed scant lung parenchyma with fibrosis and reactive changes.  No granulomata identified. Few Bordetella bronchiseptica were grown on culture. -Legionella and strep pneumo negative 09/03/2018 -Required pressors on 8/25 night.  Currently off of pressors.  Advanced HIV: CD4 58 09/09/2018, viral RNA <20 08/15/2018.  Patient reports adherence to home medications. -Continue Biktarvy, atovaquone, and weekly azithromycin  Hypotension: Improving with IV fluids however remains hypotensive and symptomatic. -Continue IV fluid resuscitation and blood transfusion -Hold home metoprolol  Acute kidney injury: Likely prerenal from anemia and hypotension. -Continue IV fluids and repeat labs in a.m.  Generalized weakness: Suspect secondary to deconditioning and anemia.  Continue fluids, blood transfusion as above.  PT eval.  Diet: Clear liquid diet DVT Prophylaxis: SCD, pharmacological prophylaxis contraindicated due to Thrombocytopenia and anemia  Advance goals of care discussion: Full code  Family Communication: no family was present at bedside, at the time of interview.  Disposition:  Discharge to Home .  Consultants: none Procedures: none  Scheduled Meds: . atovaquone  1,500 mg Oral Daily  . [START ON 10/08/2018] azithromycin  1,200 mg Oral Q Tue  . benzonatate  100 mg Oral TID  . bictegravir-emtricitabine-tenofovir AF  1 tablet Oral Daily  . Chlorhexidine Gluconate Cloth  6 each Topical Daily  . Chlorhexidine Gluconate Cloth  6 each Topical Daily  . dextromethorphan-guaiFENesin  1 tablet Oral BID  . doxycycline  100 mg Oral Q12H  . mouth rinse  15 mL Mouth Rinse  BID  . methylPREDNISolone (SOLU-MEDROL) injection  40 mg Intravenous Q12H  . sodium chloride flush  3 mL Intravenous Q12H   Continuous  Infusions: . sodium chloride Stopped (10/02/18 0158)  . sodium chloride    . sodium chloride 250 mL (10/02/18 0207)  . cefTRIAXone (ROCEPHIN)  IV Stopped (10/02/18 0157)   PRN Meds: acetaminophen **OR** acetaminophen, HYDROcodone-acetaminophen, ondansetron **OR** ondansetron (ZOFRAN) IV Antibiotics: Anti-infectives (From admission, onward)   Start     Dose/Rate Route Frequency Ordered Stop   10/08/18 1000  azithromycin (ZITHROMAX) tablet 1,200 mg     1,200 mg Oral Every Tue 10/01/18 2117     10/02/18 1000  doxycycline (VIBRAMYCIN) 100 mg in sodium chloride 0.9 % 250 mL IVPB  Status:  Discontinued     100 mg 125 mL/hr over 120 Minutes Intravenous 2 times daily 10/01/18 2115 10/02/18 0913   10/02/18 1000  atovaquone (MEPRON) 750 MG/5ML suspension 1,500 mg     1,500 mg Oral Daily 10/01/18 2117     10/02/18 1000  doxycycline (VIBRA-TABS) tablet 100 mg     100 mg Oral Every 12 hours 10/02/18 0913     10/01/18 2200  cefTRIAXone (ROCEPHIN) 1 g in sodium chloride 0.9 % 100 mL IVPB     1 g 200 mL/hr over 30 Minutes Intravenous Daily at bedtime 10/01/18 2115     10/01/18 2130  bictegravir-emtricitabine-tenofovir AF (BIKTARVY) 50-200-25 MG per tablet 1 tablet     1 tablet Oral Daily 10/01/18 2117     10/01/18 2000  doxycycline (VIBRAMYCIN) 100 mg in sodium chloride 0.9 % 250 mL IVPB  Status:  Discontinued     100 mg 125 mL/hr over 120 Minutes Intravenous  Once 10/01/18 1941 10/02/18 0734   10/01/18 1915  vancomycin (VANCOCIN) IVPB 1000 mg/200 mL premix  Status:  Discontinued     1,000 mg 200 mL/hr over 60 Minutes Intravenous  Once 10/01/18 1905 10/01/18 1910   10/01/18 1900  vancomycin (VANCOCIN) IVPB 1000 mg/200 mL premix  Status:  Discontinued     1,000 mg 200 mL/hr over 60 Minutes Intravenous  Once 10/01/18 1854 10/01/18 1900   10/01/18 1900  ceFEPIme  (MAXIPIME) 2 g in sodium chloride 0.9 % 100 mL IVPB  Status:  Discontinued     2 g 200 mL/hr over 30 Minutes Intravenous  Once 10/01/18 1854 10/01/18 1913   10/01/18 1900  azithromycin (ZITHROMAX) 500 mg in sodium chloride 0.9 % 250 mL IVPB  Status:  Discontinued     500 mg 250 mL/hr over 60 Minutes Intravenous  Once 10/01/18 1854 10/01/18 1913       Objective: Physical Exam: Vitals:   10/02/18 1530 10/02/18 1600 10/02/18 1630 10/02/18 1700  BP: 124/81 112/82 100/81 101/78  Pulse: (!) 122   (!) 111  Resp: (!) 27 (!) 32 (!) 33 (!) 29  Temp:      TempSrc:      SpO2: 99%   99%  Weight:      Height:        Intake/Output Summary (Last 24 hours) at 10/02/2018 1808 Last data filed at 10/02/2018 0600 Gross per 24 hour  Intake 2930.87 ml  Output 475 ml  Net 2455.87 ml   Filed Weights   10/01/18 1608 10/02/18 0121  Weight: 49.9 kg 51.7 kg   General: alert and oriented to time, place, and person. Appear in severe distress, affect appropriate Eyes: PERRL, Conjunctiva normal ENT: Oral Mucosa Clear, moist  Neck: difficult to assess  JVD, no Abnormal Mass Or lumps Cardiovascular: S1 and S2 Present, no Murmur, peripheral pulses symmetrical Respiratory: increased  respiratory effort, Bilateral Air entry equal and  Decreased, no use of accessory muscle, bilateral Crackles, no wheezes Abdomen: Bowel Sound present, Soft and no tenderness, no hernia Skin: no rashes  Extremities: no Pedal edema, no calf tenderness Neurologic: normal without focal findings, mental status, speech normal, alert and oriented x3, PERLA, Motor strength 5/5 and symmetric and sensation grossly normal to light touch Gait not checked due to patient safety concerns  Data Reviewed: CBC: Recent Labs  Lab 10/01/18 1219 10/02/18 0653  WBC 16.1* 17.9*  NEUTROABS 13.0 14.7*  HGB 6.0* 10.8*  10.6*  HCT 18.8* 35.5*  33.5*  MCV 93.5 98.9  PLT 39* 30*   Basic Metabolic Panel: Recent Labs  Lab 10/01/18 1832 10/02/18  0653  NA 130* 135  K 4.0 4.0  CL 105 112*  CO2 16* 16*  GLUCOSE 130* 101*  BUN 34* 26*  CREATININE 1.45* 1.09*  CALCIUM 7.2* 7.5*    Liver Function Tests: Recent Labs  Lab 10/01/18 1832  AST 21  ALT 10  ALKPHOS 189*  BILITOT 1.9*  PROT 6.4*  ALBUMIN 1.9*   No results for input(s): LIPASE, AMYLASE in the last 168 hours. No results for input(s): AMMONIA in the last 168 hours. Coagulation Profile: Recent Labs  Lab 10/01/18 1832  INR 1.3*   Cardiac Enzymes: No results for input(s): CKTOTAL, CKMB, CKMBINDEX, TROPONINI in the last 168 hours. BNP (last 3 results) No results for input(s): PROBNP in the last 8760 hours. CBG: No results for input(s): GLUCAP in the last 168 hours. Studies: Dg Chest Port 1 View  Result Date: 10/02/2018 CLINICAL DATA:  Tachypnea EXAM: PORTABLE CHEST 1 VIEW COMPARISON:  Yesterday FINDINGS: Generalized reticular pulmonary opacity, present on multiple prior studies. No pleural fluid or pneumothorax. Normal heart size. IMPRESSION: Chronic reticular pulmonary opacity without acute superimposed finding. Electronically Signed   By: Monte Fantasia M.D.   On: 10/02/2018 05:45   Dg Chest Port 1 View  Result Date: 10/01/2018 CLINICAL DATA:  Cough, pancytopenia EXAM: PORTABLE CHEST 1 VIEW COMPARISON:  09/19/2018 FINDINGS: Stable cardiomediastinal contours. Extensive, diffuse reticulonodular opacities throughout both lungs, markedly progressed from prior. No pleural effusion. No pneumothorax. Osseous structures intact. IMPRESSION: Extensive reticulonodular opacities throughout both lungs suggestive of an atypical infection in an immunocompromised patient. Electronically Signed   By: Davina Poke M.D.   On: 10/01/2018 18:38     Time spent: The patient is critically ill with multiple organ systems failure and requires high complexity decision making for assessment and support, frequent evaluation and titration of therapies. Critical Care Time devoted to  patient care services described in this note is 35 minutes   Author: Berle Mull, MD [facility] 10/02/2018 6:08 PM  To reach On-call, see care teams to locate the attending and reach out to them via [email or web address]. If 7PM-7AM, please contact night-coverage If you still have difficulty reaching the attending provider, please page the [facility] (Director on Call) for [facility] on amion for assistance.

## 2018-10-02 NOTE — Progress Notes (Signed)
PCCM INTERVAL PROGRESS NOTE  Have been asked to evaluate patient for CVL placement for blood administration.   Briefly, this is a 33 year old female with AIDS who presented with shortness of breath presumed to be secondary to symptomatic anemia. She is being transfused with two units PRBC and is hypotensive, which does not seem to be much below her baseline. She is also thrombocytopenic with plt 39.   No clear indication for CVL at this time as she now has two peripheral IV placed and has adequate access for current infusions per RN. Elevated bleeding risk with severe thrombocytopenia. Will hold off off for now and re-evaluate in the morning. Please call back sooner if needed.    Georgann Housekeeper, AGACNP-BC [place] Pager [phone] or [phone]  10/02/2018 1:33 AM

## 2018-10-03 LAB — COMPREHENSIVE METABOLIC PANEL
ALT: 10 U/L (ref 0–44)
AST: 31 U/L (ref 15–41)
Albumin: 1.7 g/dL — ABNORMAL LOW (ref 3.5–5.0)
Alkaline Phosphatase: 146 U/L — ABNORMAL HIGH (ref 38–126)
Anion gap: 13 (ref 5–15)
BUN: 35 mg/dL — ABNORMAL HIGH (ref 6–20)
CO2: 11 mmol/L — ABNORMAL LOW (ref 22–32)
Calcium: 7.4 mg/dL — ABNORMAL LOW (ref 8.9–10.3)
Chloride: 109 mmol/L (ref 98–111)
Creatinine, Ser: 1.17 mg/dL — ABNORMAL HIGH (ref 0.44–1.00)
GFR calc Af Amer: >60 mL/min (ref >60)
GFR calc non Af Amer: >60 mL/min (ref >60)
Glucose, Bld: 143 mg/dL — ABNORMAL HIGH (ref 70–99)
Potassium: 4.4 mmol/L (ref 3.5–5.1)
Sodium: 133 mmol/L — ABNORMAL LOW (ref 135–145)
Total Bilirubin: 2.3 mg/dL — ABNORMAL HIGH (ref 0.3–1.2)
Total Protein: 6 g/dL — ABNORMAL LOW (ref 6.5–8.1)

## 2018-10-03 LAB — NOVEL CORONAVIRUS, NAA (HOSP ORDER, SEND-OUT TO REF LAB; TAT 18-24 HRS): SARS-CoV-2, NAA: NOT DETECTED

## 2018-10-03 LAB — BPAM RBC
Blood Product Expiration Date: 202010012359
Blood Product Expiration Date: 202010022359
ISSUE DATE / TIME: 202008252318
ISSUE DATE / TIME: 202008260134
Unit Type and Rh: 5100
Unit Type and Rh: 5100

## 2018-10-03 LAB — CBC WITH DIFFERENTIAL/PLATELET
Abs Immature Granulocytes: 4.72 10*3/uL — ABNORMAL HIGH (ref 0.00–0.07)
Basophils Absolute: 0.1 10*3/uL (ref 0.0–0.1)
Basophils Relative: 0 %
Eosinophils Absolute: 0 10*3/uL (ref 0.0–0.5)
Eosinophils Relative: 0 %
HCT: 31.8 % — ABNORMAL LOW (ref 36.0–46.0)
Hemoglobin: 10.1 g/dL — ABNORMAL LOW (ref 12.0–15.0)
Immature Granulocytes: 20 %
Lymphocytes Relative: 7 %
Lymphs Abs: 1.8 10*3/uL (ref 0.7–4.0)
MCH: 29.4 pg (ref 26.0–34.0)
MCHC: 31.8 g/dL (ref 30.0–36.0)
MCV: 92.4 fL (ref 80.0–100.0)
Monocytes Absolute: 1.6 10*3/uL — ABNORMAL HIGH (ref 0.1–1.0)
Monocytes Relative: 7 %
Neutro Abs: 16 10*3/uL — ABNORMAL HIGH (ref 1.7–7.7)
Neutrophils Relative %: 66 %
Platelets: 14 10*3/uL — CL (ref 150–400)
RBC: 3.44 MIL/uL — ABNORMAL LOW (ref 3.87–5.11)
RDW: 18.9 % — ABNORMAL HIGH (ref 11.5–15.5)
WBC: 24.2 10*3/uL — ABNORMAL HIGH (ref 4.0–10.5)
nRBC: 0.5 % — ABNORMAL HIGH (ref 0.0–0.2)

## 2018-10-03 LAB — HAPTOGLOBIN: Haptoglobin: 265 mg/dL (ref 33–278)

## 2018-10-03 LAB — TYPE AND SCREEN
ABO/RH(D): O POS
ABO/RH(D): O POS
Antibody Screen: NEGATIVE
Antibody Screen: NEGATIVE
Unit division: 0
Unit division: 0

## 2018-10-03 LAB — C-REACTIVE PROTEIN: CRP: 23.8 mg/dL — ABNORMAL HIGH (ref <1.0)

## 2018-10-03 LAB — D-DIMER, QUANTITATIVE: D-Dimer, Quant: 2.55 ug/mL-FEU — ABNORMAL HIGH (ref 0.00–0.50)

## 2018-10-03 LAB — LACTIC ACID, PLASMA
Lactic Acid, Venous: 3.2 mmol/L (ref 0.5–1.9)
Lactic Acid, Venous: 3.1 mmol/L (ref 0.5–1.9)

## 2018-10-03 LAB — ABO/RH: ABO/RH(D): O POS

## 2018-10-03 LAB — MAGNESIUM: Magnesium: 1.4 mg/dL — ABNORMAL LOW (ref 1.7–2.4)

## 2018-10-03 MED ORDER — SODIUM CHLORIDE 0.9 % IV SOLN
Freq: Once | INTRAVENOUS | Status: DC
  Administered 2018-10-03: 04:00:00 via INTRAVENOUS

## 2018-10-03 MED ORDER — METHYLPREDNISOLONE SODIUM SUCC 40 MG IJ SOLR
40.0000 mg | Freq: Every day | INTRAMUSCULAR | Status: DC
  Administered 2018-10-03 – 2018-10-04 (×2): 40 mg via INTRAVENOUS
  Filled 2018-10-03: qty 1

## 2018-10-03 MED ORDER — SODIUM CHLORIDE 0.9% IV SOLUTION
Freq: Once | INTRAVENOUS | Status: AC
  Administered 2018-10-03: 05:00:00 via INTRAVENOUS

## 2018-10-03 MED ORDER — IPRATROPIUM-ALBUTEROL 20-100 MCG/ACT IN AERS
1.0000 | INHALATION_SPRAY | Freq: Four times a day (QID) | RESPIRATORY_TRACT | Status: DC
  Administered 2018-10-03 – 2018-10-04 (×6): 1 via RESPIRATORY_TRACT
  Filled 2018-10-03: qty 4

## 2018-10-03 MED ORDER — MAGNESIUM SULFATE 2 GM/50ML IV SOLN
2.0000 g | Freq: Once | INTRAVENOUS | Status: AC
  Administered 2018-10-03: 2 g via INTRAVENOUS

## 2018-10-03 MED ORDER — MAGNESIUM SULFATE 2 GM/50ML IV SOLN
2.0000 g | Freq: Once | INTRAVENOUS | Status: DC
  Filled 2018-10-03: qty 50

## 2018-10-03 NOTE — Progress Notes (Signed)
Temperature checked before sending patient to floor. Rectal temp of 97.8. Patient sent to floor by Nurse Tech.

## 2018-10-03 NOTE — Progress Notes (Signed)
[facility] Progress Note  Patient: Krystal Short [id number]   PCP: Lajean Manes, MD DOB: [birth year]   DOA: 10/01/2018   DOS: 10/03/2018   Date of Service: the patient was seen and examined on 10/03/2018  Brief hospital course: Pt. with PMH of advanced HIV (CD4 58 09/09/2018, viral RNA <20 08/15/2018 on Biktarvy, azithromycin, and atovaquone), normocytic anemia, thrombocytopenia, and recent admission for sepsis due to suspected pneumocystis jirovecii pneumonia; admitted on 10/01/2018, presented with complaint of cough and shortness of breath, was found to have acute pneumonia. Currently further plan is continue current medication.  Subjective: Still has shortness of breath.  No cough.  No fever no chills.  No other acute events overnight.  Assessment and Plan: Symptomatic anemia with thrombocytopenia: Bone biopsy 09/11/2018 showed hypercellular bone marrow for age with trilineage hematopoiesis, peripheral blood normocytic normochromic anemia, neutrophilic left shift, thrombocytopenia.  Recent admissions requiring multiple transfusions of PRBCs and platelets.  Also received IVIG on past admission. -Hemoglobin 6.0 at [place], order to transfuse 2 units PRBCs -Platelets 39,000, received Nplate injection day of admission 10/01/2018 -Patient denies any obvious bleeding -Change prednisone taper to IV Solu-Medrol for now.  Bilateral airspace opacities, question atypical pneumonia: Septic shock requiring pressors. Chest x-ray changes appear progressed from prior admissions.  Has been treated for suspected pneumocystis jirovecii pneumonia on prior admission with primaquine and clindamycin. -Will continue with IV ceftriaxone and doxycycline for now, de-escalate as able -Supplemental oxygen as needed -Follow-up blood cultures -SARS-CoV-2 test negative -Prior blood, fungal, bone marrow cultures are negative.   -Prior bone marrow acid-fast smear is negative.   -Negative respiratory viral  panel 09/03/2018, positive coronavirus 229E on respiratory viral panel 08/02/2018 -SARS-CoV-2 test negative 09/03/2018, 08/01/2018, and 07/30/2018 -bronchoscopy 07/12/2018 showed scant lung parenchyma with fibrosis and reactive changes.  No granulomata identified. Few Bordetella bronchiseptica were grown on culture. -Legionella and strep pneumo negative 09/03/2018 -Required pressors on 8/25 night.  Currently off of pressors.  Advanced HIV: CD4 58 09/09/2018, viral RNA <20 08/15/2018.  Patient reports adherence to home medications. -Continue Biktarvy, atovaquone, and weekly azithromycin  Hypotension: Improving with IV fluids Hold IV fluids now.  Acute kidney injury: Likely prerenal from anemia and hypotension. Monitor renal function  Generalized weakness: Suspect secondary to deconditioning and anemia.  Continue fluids, blood transfusion as above.  PT eval.  Diet: Regular diet DVT Prophylaxis: SCD, pharmacological prophylaxis contraindicated due to Thrombocytopenia and anemia  Advance goals of care discussion: Full code  Family Communication: no family was present at bedside, at the time of interview.  Disposition:  Discharge to Home .  Consultants: none Procedures: none  Scheduled Meds: . atovaquone  1,500 mg Oral Daily  . [START ON 10/08/2018] azithromycin  1,200 mg Oral Q Tue  . benzonatate  100 mg Oral TID  . bictegravir-emtricitabine-tenofovir AF  1 tablet Oral Daily  . Chlorhexidine Gluconate Cloth  6 each Topical Daily  . dextromethorphan-guaiFENesin  1 tablet Oral BID  . doxycycline  100 mg Oral Q12H  . Ipratropium-Albuterol  1 puff Inhalation QID  . mouth rinse  15 mL Mouth Rinse BID  . methylPREDNISolone (SOLU-MEDROL) injection  40 mg Intravenous Daily  . sodium chloride flush  3 mL Intravenous Q12H   Continuous Infusions: . sodium chloride 250 mL (10/02/18 0207)  . cefTRIAXone (ROCEPHIN)  IV Stopped (10/02/18 2312)   PRN Meds: acetaminophen **OR** acetaminophen,  HYDROcodone-acetaminophen, ondansetron **OR** ondansetron (ZOFRAN) IV Antibiotics: Anti-infectives (From admission, onward)   Start     Dose/Rate Route Frequency  Ordered Stop   10/08/18 1000  azithromycin (ZITHROMAX) tablet 1,200 mg     1,200 mg Oral Every Tue 10/01/18 2117     10/02/18 1000  doxycycline (VIBRAMYCIN) 100 mg in sodium chloride 0.9 % 250 mL IVPB  Status:  Discontinued     100 mg 125 mL/hr over 120 Minutes Intravenous 2 times daily 10/01/18 2115 10/02/18 0913   10/02/18 1000  atovaquone (MEPRON) 750 MG/5ML suspension 1,500 mg     1,500 mg Oral Daily 10/01/18 2117     10/02/18 1000  doxycycline (VIBRA-TABS) tablet 100 mg     100 mg Oral Every 12 hours 10/02/18 0913     10/01/18 2200  cefTRIAXone (ROCEPHIN) 1 g in sodium chloride 0.9 % 100 mL IVPB     1 g 200 mL/hr over 30 Minutes Intravenous Daily at bedtime 10/01/18 2115     10/01/18 2130  bictegravir-emtricitabine-tenofovir AF (BIKTARVY) 50-200-25 MG per tablet 1 tablet     1 tablet Oral Daily 10/01/18 2117     10/01/18 2000  doxycycline (VIBRAMYCIN) 100 mg in sodium chloride 0.9 % 250 mL IVPB  Status:  Discontinued     100 mg 125 mL/hr over 120 Minutes Intravenous  Once 10/01/18 1941 10/02/18 0734   10/01/18 1915  vancomycin (VANCOCIN) IVPB 1000 mg/200 mL premix  Status:  Discontinued     1,000 mg 200 mL/hr over 60 Minutes Intravenous  Once 10/01/18 1905 10/01/18 1910   10/01/18 1900  vancomycin (VANCOCIN) IVPB 1000 mg/200 mL premix  Status:  Discontinued     1,000 mg 200 mL/hr over 60 Minutes Intravenous  Once 10/01/18 1854 10/01/18 1900   10/01/18 1900  ceFEPIme (MAXIPIME) 2 g in sodium chloride 0.9 % 100 mL IVPB  Status:  Discontinued     2 g 200 mL/hr over 30 Minutes Intravenous  Once 10/01/18 1854 10/01/18 1913   10/01/18 1900  azithromycin (ZITHROMAX) 500 mg in sodium chloride 0.9 % 250 mL IVPB  Status:  Discontinued     500 mg 250 mL/hr over 60 Minutes Intravenous  Once 10/01/18 1854 10/01/18 1913        Objective: Physical Exam: Vitals:   10/03/18 1141 10/03/18 1327 10/03/18 1503 10/03/18 1630  BP: (!) 149/92 95/61 104/77 96/71  Pulse: (!) 118 (!) 116 (!) 111 (!) 103  Resp: (!) 28 16 16  (!) 22  Temp: (!) 97.4 F (36.3 C) (!) 97.3 F (36.3 C) 97.6 F (36.4 C) 97.6 F (36.4 C)  TempSrc: Oral Oral Oral Oral  SpO2: 98%  98% 97%  Weight:      Height:        Intake/Output Summary (Last 24 hours) at 10/03/2018 2014 Last data filed at 10/03/2018 [id number] Gross per 24 hour  Intake 421.97 ml  Output 150 ml  Net 271.97 ml   Filed Weights   10/01/18 1608 10/02/18 0121 10/03/18 0500  Weight: 49.9 kg 51.7 kg 54.5 kg   General: alert and oriented to time, place, and person. Appear in severe distress, affect appropriate Eyes: PERRL, Conjunctiva normal ENT: Oral Mucosa Clear, moist  Neck: difficult to assess  JVD, no Abnormal Mass Or lumps Cardiovascular: S1 and S2 Present, no Murmur, peripheral pulses symmetrical Respiratory: increased  respiratory effort, Bilateral Air entry equal and Decreased, no use of accessory muscle, bilateral Crackles, no wheezes Abdomen: Bowel Sound present, Soft and no tenderness, no hernia Skin: no rashes  Extremities: no Pedal edema, no calf tenderness Neurologic: normal without focal findings, mental status, speech  normal, alert and oriented x3, PERLA, Motor strength 5/5 and symmetric and sensation grossly normal to light touch Gait not checked due to patient safety concerns  Data Reviewed: CBC: Recent Labs  Lab 10/01/18 1219 10/02/18 0653 10/03/18 0210  WBC 16.1* 17.9* 24.2*  NEUTROABS 13.0 14.7* 16.0*  HGB 6.0* 10.8*  10.6* 10.1*  HCT 18.8* 35.5*  33.5* 31.8*  MCV 93.5 98.9 92.4  PLT 39* 30* 14*   Basic Metabolic Panel: Recent Labs  Lab 10/01/18 1832 10/02/18 0653 10/03/18 0210  NA 130* 135 133*  K 4.0 4.0 4.4  CL 105 112* 109  CO2 16* 16* 11*  GLUCOSE 130* 101* 143*  BUN 34* 26* 35*  CREATININE 1.45* 1.09* 1.17*  CALCIUM 7.2* 7.5*  7.4*  MG  --   --  1.4*    Liver Function Tests: Recent Labs  Lab 10/01/18 1832 10/03/18 0210  AST 21 31  ALT 10 10  ALKPHOS 189* 146*  BILITOT 1.9* 2.3*  PROT 6.4* 6.0*  ALBUMIN 1.9* 1.7*   No results for input(s): LIPASE, AMYLASE in the last 168 hours. No results for input(s): AMMONIA in the last 168 hours. Coagulation Profile: Recent Labs  Lab 10/01/18 1832  INR 1.3*   Cardiac Enzymes: No results for input(s): CKTOTAL, CKMB, CKMBINDEX, TROPONINI in the last 168 hours. BNP (last 3 results) No results for input(s): PROBNP in the last 8760 hours. CBG: No results for input(s): GLUCAP in the last 168 hours. Studies: No results found.   Time spent:  35 minutes   Author: Berle Mull, MD [facility] 10/03/2018 8:14 PM  To reach On-call, see care teams to locate the attending and reach out to them via [email or web address]. If 7PM-7AM, please contact night-coverage If you still have difficulty reaching the attending provider, please page the [facility] (Director on Call) for [facility] on amion for assistance.

## 2018-10-03 NOTE — Progress Notes (Addendum)
CRITICAL VALUE ALERT  Critical Value:  Lactic acid: 3.2  Platelets: 14  Date & Time Notied:  [id number] 10/03/18  Provider Notified: Lennox Grumbles  Orders Received/Actions taken: Awaiting

## 2018-10-04 LAB — COMPREHENSIVE METABOLIC PANEL
ALT: 10 U/L (ref 0–44)
AST: 18 U/L (ref 15–41)
Albumin: 1.7 g/dL — ABNORMAL LOW (ref 3.5–5.0)
Alkaline Phosphatase: 125 U/L (ref 38–126)
Anion gap: 8 (ref 5–15)
BUN: 43 mg/dL — ABNORMAL HIGH (ref 6–20)
CO2: 16 mmol/L — ABNORMAL LOW (ref 22–32)
Calcium: 7.5 mg/dL — ABNORMAL LOW (ref 8.9–10.3)
Chloride: 110 mmol/L (ref 98–111)
Creatinine, Ser: 1.05 mg/dL — ABNORMAL HIGH (ref 0.44–1.00)
GFR calc Af Amer: >60 mL/min (ref >60)
GFR calc non Af Amer: >60 mL/min (ref >60)
Glucose, Bld: 111 mg/dL — ABNORMAL HIGH (ref 70–99)
Potassium: 4.1 mmol/L (ref 3.5–5.1)
Sodium: 134 mmol/L — ABNORMAL LOW (ref 135–145)
Total Bilirubin: 1.6 mg/dL — ABNORMAL HIGH (ref 0.3–1.2)
Total Protein: 6.2 g/dL — ABNORMAL LOW (ref 6.5–8.1)

## 2018-10-04 LAB — BPAM PLATELET PHERESIS
Blood Product Expiration Date: 202008272359
ISSUE DATE / TIME: 202008270503
Unit Type and Rh: 6200

## 2018-10-04 LAB — CBC WITH DIFFERENTIAL/PLATELET
Abs Immature Granulocytes: 4.88 10*3/uL — ABNORMAL HIGH (ref 0.00–0.07)
Basophils Absolute: 0 10*3/uL (ref 0.0–0.1)
Basophils Relative: 0 %
Eosinophils Absolute: 0 10*3/uL (ref 0.0–0.5)
Eosinophils Relative: 0 %
HCT: 26 % — ABNORMAL LOW (ref 36.0–46.0)
Hemoglobin: 8.5 g/dL — ABNORMAL LOW (ref 12.0–15.0)
Immature Granulocytes: 24 %
Lymphocytes Relative: 5 %
Lymphs Abs: 1 10*3/uL (ref 0.7–4.0)
MCH: 30.2 pg (ref 26.0–34.0)
MCHC: 32.7 g/dL (ref 30.0–36.0)
MCV: 92.5 fL (ref 80.0–100.0)
Monocytes Absolute: 2 10*3/uL — ABNORMAL HIGH (ref 0.1–1.0)
Monocytes Relative: 10 %
Neutro Abs: 12.5 10*3/uL — ABNORMAL HIGH (ref 1.7–7.7)
Neutrophils Relative %: 61 %
Platelets: 13 10*3/uL — CL (ref 150–400)
RBC: 2.81 MIL/uL — ABNORMAL LOW (ref 3.87–5.11)
RDW: 19.1 % — ABNORMAL HIGH (ref 11.5–15.5)
WBC: 20.4 10*3/uL — ABNORMAL HIGH (ref 4.0–10.5)
nRBC: 0.8 % — ABNORMAL HIGH (ref 0.0–0.2)

## 2018-10-04 LAB — PREPARE PLATELET PHERESIS: Unit division: 0

## 2018-10-04 LAB — MAGNESIUM: Magnesium: 2.3 mg/dL (ref 1.7–2.4)

## 2018-10-04 MED ORDER — DOXYCYCLINE HYCLATE 100 MG PO TABS: 100.0000 mg | ORAL_TABLET | Freq: Two times a day (BID) | ORAL | 0 refills | Status: DC

## 2018-10-04 MED ORDER — PREDNISONE 20 MG PO TABS: 40.0000 mg | ORAL_TABLET | Freq: Every day | ORAL | Status: DC

## 2018-10-04 MED ORDER — BENZONATATE 100 MG PO CAPS: 100.0000 mg | ORAL_CAPSULE | Freq: Three times a day (TID) | ORAL | 0 refills | Status: DC

## 2018-10-04 MED ORDER — PREDNISONE 10 MG PO TABS: ORAL_TABLET | ORAL | 0 refills | Status: DC

## 2018-10-04 MED ORDER — DM-GUAIFENESIN ER 30-600 MG PO TB12: 1.0000 | ORAL_TABLET | Freq: Two times a day (BID) | ORAL | 0 refills | Status: AC

## 2018-10-04 MED ORDER — CEFDINIR 300 MG PO CAPS: 300.0000 mg | ORAL_CAPSULE | Freq: Two times a day (BID) | ORAL | 0 refills | Status: DC

## 2018-10-04 NOTE — Progress Notes (Signed)
MEWS yellow. Not acute change. MD aware. Will continue to monitor.    10/03/18 2139  MEWS Score  Resp (!) 24  Pulse Rate (!) 114  BP 109/76  Temp 97.7 F (36.5 C)  O2 Device Room Air  MEWS Score  MEWS RR 1  MEWS Pulse 2  MEWS Systolic 0  MEWS LOC 0  MEWS Temp 0  MEWS Score 3  MEWS Score Color Yellow

## 2018-10-04 NOTE — Progress Notes (Signed)
Patient discharged home.  IVs removed - WNL.  Reviewed AVS and medications.  Patient verbalizes understanding.  No questions at this time.  Emphasized importance of completing entire dose of abx.  Patient assisted off unit via [place] in NAD.

## 2018-10-04 NOTE — TOC Initial Note (Signed)
Transition of Care [facility]) - Initial/Assessment Note    Patient Details  Name: Krystal Short MRN: [id number] Date of Birth: August 10, 1985  Transition of Care [facility]) CM/SW Contact:    Purcell Mouton, RN Phone Number: 10/04/2018, 4:01 PM  Clinical Narrative:                 Pt will discharge home with family and RW with no other needs.   Expected Discharge Plan: Home/Self Care     Patient Goals and CMS Choice Patient states their goals for this hospitalization and ongoing recovery are:: To get better      Expected Discharge Plan and Services Expected Discharge Plan: Home/Self Care   Discharge Planning Services: CM Consult Post Acute Care Choice: Durable Medical Equipment Living arrangements for the past 2 months: Single Family Home                 DME Arranged: Walker(RW) DME Agency: [employer] Date DME Agency Contacted: 10/04/18 Time DME Agency Contacted: [phone] Representative spoke with at DME Agency: Thedore Mins            Prior Living Arrangements/Services Living arrangements for the past 2 months: Single Family Home Lives with:: Spouse Patient language and need for interpreter reviewed:: No Do you feel safe going back to the place where you live?: Yes               Activities of Daily Living Home Assistive Devices/Equipment: None ADL Screening (condition at time of admission) Patient's cognitive ability adequate to safely complete daily activities?: Yes Is the patient deaf or have difficulty hearing?: No Does the patient have difficulty seeing, even when wearing glasses/contacts?: No Does the patient have difficulty concentrating, remembering, or making decisions?: No Patient able to express need for assistance with ADLs?: Yes Does the patient have difficulty dressing or bathing?: Yes Independently performs ADLs?: No Does the patient have difficulty walking or climbing stairs?: Yes Weakness of Legs: Both Weakness of Arms/Hands: Both  Permission  Sought/Granted Permission sought to share information with : Case Manager                Emotional Assessment Appearance:: Appears stated age     Orientation: : Oriented to Self, Oriented to Place, Oriented to  Time, Oriented to Situation      Admission diagnosis:  Symptomatic anemia [D64.9] Patient Active Problem List   Diagnosis Date Noted  . Leukocytosis 10/01/2018  . Hypotension 10/01/2018  . Alteration in performance of activities of daily living 09/18/2018  . Anemia   . Sepsis due to pneumonia ([place]) 09/03/2018  . Pancytopenia, acquired ([place])   . Elevated bilirubin   . AKI (acute kidney injury) ([place])   . Acute respiratory failure with hypoxemia ([place]) 08/03/2018  . Chest pain   . Fever 08/02/2018  . HCAP (healthcare-associated pneumonia) 08/02/2018  . Sepsis ([place]) 08/02/2018  . Hemophagocytosis present in bone marrow ([place])   . Pulmonary infiltrates 07/08/2018  . Hilar adenopathy 07/08/2018  . Acute respiratory failure with hypoxia ([place]) 07/04/2018  . Symptomatic anemia 06/10/2018  . Thrombocytopenia ([place]) 06/10/2018  . ASCUS with positive high risk HPV cervical 06/10/2018  . Pneumonia 05/28/2018  . Suspected pneumocystis pneumonia ([place]) 03/08/2018  . Medication monitoring encounter 12/31/2017  . Molluscum contagiosum 06/27/2017  . AIDS (acquired immune deficiency syndrome) ([place])   . Esophageal candidiasis ([place])   . Hyponatremia 06/03/2017   PCP:  Lajean Manes, MD Pharmacy:   [id number] - [place], [address] [place] [address] Phone: [phone] Fax: [phone]     Social Determinants of Health (SDOH) Interventions    Readmission Risk Interventions Readmission Risk Prevention Plan 09/10/2018 08/08/2018  Transportation Screening Complete Complete  Medication Review Press photographer) Complete Complete  PCP or Specialist appointment within 3-5 days of discharge Not Complete Not Complete  PCP/Specialist Appt  Not Complete comments Not close to dc Not ready for DC  HRI or [place] Not Complete Not Complete  HRI or Home Care Consult Pt Refusal Comments NA NA  SW Recovery Care/Counseling Consult Not Complete Not Complete  SW Consult Not Complete Comments NA NA  Palliative Care Screening Not Complete Not Complete  Comments NA NA  Skilled Nursing Facility Not Complete Not Complete  SNF Comments NA NA  Some recent data might be hidden

## 2018-10-04 NOTE — Progress Notes (Signed)
Physical Therapy Treatment Patient Details Name: Krystal Short MRN: [id number] DOB: 06/30/1985 Today's Date: 10/04/2018    History of Present Illness 33 yo female admitted with anemia, atypical Pna. COVID test pending 8/26. Hx of AIDS, pneumocystis, anemia    PT Comments    Progressing well with mobility. Improved performance and activity tolerance on today.    Follow Up Recommendations  Supervision for mobility/OOB     Equipment Recommendations  Rolling walker with 5" wheels    Recommendations for Other Services       Precautions / Restrictions Precautions Precautions: Fall Restrictions Weight Bearing Restrictions: No    Mobility  Bed Mobility Overal bed mobility: Modified Independent                Transfers Overall transfer level: Modified independent                  Ambulation/Gait Ambulation/Gait assistance: Supervision Gait Distance (Feet): 200 Feet Assistive device: None Gait Pattern/deviations: Wide base of support     General Gait Details: mildly unsteady intermittently. dyspnea 2/4. SpO2 95% on RA. Pt tolerated distance well.   Stairs             Wheelchair Mobility    Modified Rankin (Stroke Patients Only)       Balance           Standing balance support: During functional activity Standing balance-Leahy Scale: Good                              Cognition Arousal/Alertness: Awake/alert Behavior During Therapy: WFL for tasks assessed/performed Overall Cognitive Status: Within Functional Limits for tasks assessed                                        Exercises      General Comments        Pertinent Vitals/Pain Pain Assessment: No/denies pain    Home Living                      Prior Function            PT Goals (current goals can now be found in the care plan section) Progress towards PT goals: Progressing toward goals    Frequency    Min 3X/week       PT Plan Discharge plan needs to be updated    Co-evaluation              AM-PAC PT "6 Clicks" Mobility   Outcome Measure  Help needed turning from your back to your side while in a flat bed without using bedrails?: None Help needed moving from lying on your back to sitting on the side of a flat bed without using bedrails?: None Help needed moving to and from a bed to a chair (including a wheelchair)?: None Help needed standing up from a chair using your arms (e.g., wheelchair or bedside chair)?: None Help needed to walk in hospital room?: A Little Help needed climbing 3-5 steps with a railing? : A Little 6 Click Score: 22    End of Session Equipment Utilized During Treatment: Gait belt Activity Tolerance: Patient tolerated treatment well Patient left: in bed;with call bell/phone within reach   PT Visit Diagnosis: Unsteadiness on feet (R26.81)     Time: [id number] PT Time Calculation (min) (  ACUTE ONLY): 15 min  Charges:  $Gait Training: 8-22 mins                       Weston Anna, PT Acute Rehabilitation Services Pager: [phone] Office: [phone]

## 2018-10-05 ENCOUNTER — Other Ambulatory Visit: Payer: Self-pay

## 2018-10-05 ENCOUNTER — Emergency Department (HOSPITAL_COMMUNITY): Payer: 59

## 2018-10-05 ENCOUNTER — Inpatient Hospital Stay (HOSPITAL_COMMUNITY)
Admission: EM | Admit: 2018-10-05 | Discharge: 2018-10-23 | DRG: 974 | Disposition: A | Discharge status: Home / Self Care | Payer: 59 | Attending: Internal Medicine | Admitting: Internal Medicine

## 2018-10-05 ENCOUNTER — Encounter (HOSPITAL_COMMUNITY): Payer: Self-pay | Admitting: Emergency Medicine

## 2018-10-05 DIAGNOSIS — A419 Sepsis, unspecified organism: Secondary | ICD-10-CM | POA: Diagnosis not present

## 2018-10-05 DIAGNOSIS — B59 Pneumocystosis: Secondary | ICD-10-CM | POA: Diagnosis not present

## 2018-10-05 DIAGNOSIS — D638 Anemia in other chronic diseases classified elsewhere: Secondary | ICD-10-CM | POA: Diagnosis present

## 2018-10-05 DIAGNOSIS — J90 Pleural effusion, not elsewhere classified: Secondary | ICD-10-CM | POA: Diagnosis not present

## 2018-10-05 DIAGNOSIS — D61818 Other pancytopenia: Secondary | ICD-10-CM | POA: Diagnosis not present

## 2018-10-05 DIAGNOSIS — J80 Acute respiratory distress syndrome: Secondary | ICD-10-CM | POA: Diagnosis not present

## 2018-10-05 DIAGNOSIS — Z9289 Personal history of other medical treatment: Secondary | ICD-10-CM

## 2018-10-05 DIAGNOSIS — R17 Unspecified jaundice: Secondary | ICD-10-CM | POA: Diagnosis present

## 2018-10-05 DIAGNOSIS — R0902 Hypoxemia: Secondary | ICD-10-CM

## 2018-10-05 DIAGNOSIS — N179 Acute kidney failure, unspecified: Secondary | ICD-10-CM | POA: Diagnosis not present

## 2018-10-05 DIAGNOSIS — R918 Other nonspecific abnormal finding of lung field: Secondary | ICD-10-CM | POA: Diagnosis present

## 2018-10-05 DIAGNOSIS — R6521 Severe sepsis with septic shock: Secondary | ICD-10-CM | POA: Diagnosis not present

## 2018-10-05 DIAGNOSIS — B081 Molluscum contagiosum: Secondary | ICD-10-CM | POA: Diagnosis not present

## 2018-10-05 DIAGNOSIS — M7989 Other specified soft tissue disorders: Secondary | ICD-10-CM

## 2018-10-05 DIAGNOSIS — R140 Abdominal distension (gaseous): Secondary | ICD-10-CM | POA: Diagnosis not present

## 2018-10-05 DIAGNOSIS — D649 Anemia, unspecified: Secondary | ICD-10-CM

## 2018-10-05 DIAGNOSIS — E875 Hyperkalemia: Secondary | ICD-10-CM | POA: Diagnosis present

## 2018-10-05 DIAGNOSIS — R87610 Atypical squamous cells of undetermined significance on cytologic smear of cervix (ASC-US): Secondary | ICD-10-CM | POA: Diagnosis present

## 2018-10-05 DIAGNOSIS — E876 Hypokalemia: Secondary | ICD-10-CM | POA: Diagnosis not present

## 2018-10-05 DIAGNOSIS — Z8701 Personal history of pneumonia (recurrent): Secondary | ICD-10-CM

## 2018-10-05 DIAGNOSIS — R591 Generalized enlarged lymph nodes: Secondary | ICD-10-CM | POA: Diagnosis present

## 2018-10-05 DIAGNOSIS — R87810 Cervical high risk human papillomavirus (HPV) DNA test positive: Secondary | ICD-10-CM | POA: Diagnosis present

## 2018-10-05 DIAGNOSIS — R748 Abnormal levels of other serum enzymes: Secondary | ICD-10-CM | POA: Diagnosis present

## 2018-10-05 DIAGNOSIS — E8770 Fluid overload, unspecified: Secondary | ICD-10-CM | POA: Diagnosis not present

## 2018-10-05 DIAGNOSIS — Z20828 Contact with and (suspected) exposure to other viral communicable diseases: Secondary | ICD-10-CM | POA: Diagnosis present

## 2018-10-05 DIAGNOSIS — Z0184 Encounter for antibody response examination: Secondary | ICD-10-CM

## 2018-10-05 DIAGNOSIS — J9600 Acute respiratory failure, unspecified whether with hypoxia or hypercapnia: Secondary | ICD-10-CM

## 2018-10-05 DIAGNOSIS — R0603 Acute respiratory distress: Secondary | ICD-10-CM | POA: Diagnosis not present

## 2018-10-05 DIAGNOSIS — E874 Mixed disorder of acid-base balance: Secondary | ICD-10-CM | POA: Diagnosis present

## 2018-10-05 DIAGNOSIS — Z79899 Other long term (current) drug therapy: Secondary | ICD-10-CM

## 2018-10-05 DIAGNOSIS — R64 Cachexia: Secondary | ICD-10-CM | POA: Diagnosis not present

## 2018-10-05 DIAGNOSIS — D6959 Other secondary thrombocytopenia: Secondary | ICD-10-CM | POA: Diagnosis present

## 2018-10-05 DIAGNOSIS — B957 Other staphylococcus as the cause of diseases classified elsewhere: Secondary | ICD-10-CM | POA: Diagnosis present

## 2018-10-05 DIAGNOSIS — D696 Thrombocytopenia, unspecified: Secondary | ICD-10-CM

## 2018-10-05 DIAGNOSIS — N170 Acute kidney failure with tubular necrosis: Secondary | ICD-10-CM | POA: Diagnosis not present

## 2018-10-05 DIAGNOSIS — Z87891 Personal history of nicotine dependence: Secondary | ICD-10-CM

## 2018-10-05 DIAGNOSIS — Z6821 Body mass index (BMI) 21.0-21.9, adult: Secondary | ICD-10-CM

## 2018-10-05 DIAGNOSIS — R509 Fever, unspecified: Secondary | ICD-10-CM | POA: Diagnosis not present

## 2018-10-05 DIAGNOSIS — R0602 Shortness of breath: Secondary | ICD-10-CM | POA: Diagnosis not present

## 2018-10-05 DIAGNOSIS — Z87440 Personal history of urinary (tract) infections: Secondary | ICD-10-CM

## 2018-10-05 DIAGNOSIS — J189 Pneumonia, unspecified organism: Secondary | ICD-10-CM | POA: Diagnosis not present

## 2018-10-05 DIAGNOSIS — E162 Hypoglycemia, unspecified: Secondary | ICD-10-CM | POA: Diagnosis not present

## 2018-10-05 DIAGNOSIS — E872 Acidosis, unspecified: Secondary | ICD-10-CM

## 2018-10-05 DIAGNOSIS — Z452 Encounter for adjustment and management of vascular access device: Secondary | ICD-10-CM | POA: Diagnosis not present

## 2018-10-05 DIAGNOSIS — J9601 Acute respiratory failure with hypoxia: Secondary | ICD-10-CM

## 2018-10-05 DIAGNOSIS — R590 Localized enlarged lymph nodes: Secondary | ICD-10-CM | POA: Diagnosis not present

## 2018-10-05 DIAGNOSIS — F419 Anxiety disorder, unspecified: Secondary | ICD-10-CM | POA: Diagnosis present

## 2018-10-05 DIAGNOSIS — R7989 Other specified abnormal findings of blood chemistry: Secondary | ICD-10-CM | POA: Diagnosis not present

## 2018-10-05 DIAGNOSIS — D761 Hemophagocytic lymphohistiocytosis: Secondary | ICD-10-CM | POA: Diagnosis present

## 2018-10-05 DIAGNOSIS — G9341 Metabolic encephalopathy: Secondary | ICD-10-CM | POA: Diagnosis not present

## 2018-10-05 DIAGNOSIS — D619 Aplastic anemia, unspecified: Secondary | ICD-10-CM | POA: Diagnosis present

## 2018-10-05 DIAGNOSIS — Z888 Allergy status to other drugs, medicaments and biological substances status: Secondary | ICD-10-CM | POA: Diagnosis not present

## 2018-10-05 DIAGNOSIS — Z9911 Dependence on respirator [ventilator] status: Secondary | ICD-10-CM | POA: Diagnosis not present

## 2018-10-05 DIAGNOSIS — Z978 Presence of other specified devices: Secondary | ICD-10-CM

## 2018-10-05 DIAGNOSIS — Z4659 Encounter for fitting and adjustment of other gastrointestinal appliance and device: Secondary | ICD-10-CM

## 2018-10-05 DIAGNOSIS — E440 Moderate protein-calorie malnutrition: Secondary | ICD-10-CM | POA: Diagnosis present

## 2018-10-05 DIAGNOSIS — B20 Human immunodeficiency virus [HIV] disease: Principal | ICD-10-CM | POA: Diagnosis present

## 2018-10-05 DIAGNOSIS — R042 Hemoptysis: Secondary | ICD-10-CM | POA: Diagnosis not present

## 2018-10-05 DIAGNOSIS — R161 Splenomegaly, not elsewhere classified: Secondary | ICD-10-CM | POA: Diagnosis not present

## 2018-10-05 NOTE — ED Triage Notes (Signed)
Pt presents with shortness of breath for the last few days pt recently dx from hospital.

## 2018-10-05 NOTE — ED Notes (Signed)
Patient transported to X-ray 

## 2018-10-06 ENCOUNTER — Inpatient Hospital Stay (HOSPITAL_COMMUNITY): Payer: 59

## 2018-10-06 ENCOUNTER — Emergency Department (HOSPITAL_COMMUNITY): Payer: 59

## 2018-10-06 DIAGNOSIS — R0602 Shortness of breath: Secondary | ICD-10-CM | POA: Diagnosis not present

## 2018-10-06 DIAGNOSIS — Z87891 Personal history of nicotine dependence: Secondary | ICD-10-CM

## 2018-10-06 DIAGNOSIS — E440 Moderate protein-calorie malnutrition: Secondary | ICD-10-CM | POA: Diagnosis present

## 2018-10-06 DIAGNOSIS — D638 Anemia in other chronic diseases classified elsewhere: Secondary | ICD-10-CM | POA: Diagnosis not present

## 2018-10-06 DIAGNOSIS — D761 Hemophagocytic lymphohistiocytosis: Secondary | ICD-10-CM | POA: Diagnosis not present

## 2018-10-06 DIAGNOSIS — R0902 Hypoxemia: Secondary | ICD-10-CM

## 2018-10-06 DIAGNOSIS — R590 Localized enlarged lymph nodes: Secondary | ICD-10-CM | POA: Diagnosis not present

## 2018-10-06 DIAGNOSIS — B59 Pneumocystosis: Secondary | ICD-10-CM | POA: Diagnosis not present

## 2018-10-06 DIAGNOSIS — B081 Molluscum contagiosum: Secondary | ICD-10-CM | POA: Diagnosis not present

## 2018-10-06 DIAGNOSIS — R0603 Acute respiratory distress: Secondary | ICD-10-CM | POA: Diagnosis not present

## 2018-10-06 DIAGNOSIS — Z79899 Other long term (current) drug therapy: Secondary | ICD-10-CM | POA: Diagnosis not present

## 2018-10-06 DIAGNOSIS — E874 Mixed disorder of acid-base balance: Secondary | ICD-10-CM | POA: Diagnosis present

## 2018-10-06 DIAGNOSIS — R918 Other nonspecific abnormal finding of lung field: Secondary | ICD-10-CM | POA: Diagnosis not present

## 2018-10-06 DIAGNOSIS — R140 Abdominal distension (gaseous): Secondary | ICD-10-CM | POA: Diagnosis not present

## 2018-10-06 DIAGNOSIS — R579 Shock, unspecified: Secondary | ICD-10-CM | POA: Diagnosis not present

## 2018-10-06 DIAGNOSIS — J189 Pneumonia, unspecified organism: Secondary | ICD-10-CM | POA: Diagnosis not present

## 2018-10-06 DIAGNOSIS — J9690 Respiratory failure, unspecified, unspecified whether with hypoxia or hypercapnia: Secondary | ICD-10-CM | POA: Diagnosis not present

## 2018-10-06 DIAGNOSIS — Z20828 Contact with and (suspected) exposure to other viral communicable diseases: Secondary | ICD-10-CM | POA: Diagnosis present

## 2018-10-06 DIAGNOSIS — R748 Abnormal levels of other serum enzymes: Secondary | ICD-10-CM | POA: Diagnosis not present

## 2018-10-06 DIAGNOSIS — B20 Human immunodeficiency virus [HIV] disease: Secondary | ICD-10-CM | POA: Diagnosis not present

## 2018-10-06 DIAGNOSIS — N170 Acute kidney failure with tubular necrosis: Secondary | ICD-10-CM | POA: Diagnosis present

## 2018-10-06 DIAGNOSIS — J9601 Acute respiratory failure with hypoxia: Secondary | ICD-10-CM | POA: Diagnosis not present

## 2018-10-06 DIAGNOSIS — R64 Cachexia: Secondary | ICD-10-CM | POA: Diagnosis not present

## 2018-10-06 DIAGNOSIS — Z4682 Encounter for fitting and adjustment of non-vascular catheter: Secondary | ICD-10-CM | POA: Diagnosis not present

## 2018-10-06 DIAGNOSIS — R87810 Cervical high risk human papillomavirus (HPV) DNA test positive: Secondary | ICD-10-CM | POA: Diagnosis present

## 2018-10-06 DIAGNOSIS — B957 Other staphylococcus as the cause of diseases classified elsewhere: Secondary | ICD-10-CM | POA: Diagnosis present

## 2018-10-06 DIAGNOSIS — Z9289 Personal history of other medical treatment: Secondary | ICD-10-CM | POA: Diagnosis not present

## 2018-10-06 DIAGNOSIS — G9341 Metabolic encephalopathy: Secondary | ICD-10-CM | POA: Diagnosis not present

## 2018-10-06 DIAGNOSIS — M7989 Other specified soft tissue disorders: Secondary | ICD-10-CM | POA: Diagnosis not present

## 2018-10-06 DIAGNOSIS — R7989 Other specified abnormal findings of blood chemistry: Secondary | ICD-10-CM | POA: Diagnosis not present

## 2018-10-06 DIAGNOSIS — R6521 Severe sepsis with septic shock: Secondary | ICD-10-CM | POA: Diagnosis not present

## 2018-10-06 DIAGNOSIS — E872 Acidosis: Secondary | ICD-10-CM | POA: Diagnosis not present

## 2018-10-06 DIAGNOSIS — I959 Hypotension, unspecified: Secondary | ICD-10-CM | POA: Diagnosis not present

## 2018-10-06 DIAGNOSIS — J90 Pleural effusion, not elsewhere classified: Secondary | ICD-10-CM | POA: Diagnosis not present

## 2018-10-06 DIAGNOSIS — R4182 Altered mental status, unspecified: Secondary | ICD-10-CM | POA: Diagnosis not present

## 2018-10-06 DIAGNOSIS — J80 Acute respiratory distress syndrome: Secondary | ICD-10-CM | POA: Diagnosis not present

## 2018-10-06 DIAGNOSIS — Z888 Allergy status to other drugs, medicaments and biological substances status: Secondary | ICD-10-CM | POA: Diagnosis not present

## 2018-10-06 DIAGNOSIS — Z0184 Encounter for antibody response examination: Secondary | ICD-10-CM | POA: Diagnosis not present

## 2018-10-06 DIAGNOSIS — D6959 Other secondary thrombocytopenia: Secondary | ICD-10-CM | POA: Diagnosis present

## 2018-10-06 DIAGNOSIS — D696 Thrombocytopenia, unspecified: Secondary | ICD-10-CM | POA: Diagnosis not present

## 2018-10-06 DIAGNOSIS — D619 Aplastic anemia, unspecified: Secondary | ICD-10-CM | POA: Diagnosis present

## 2018-10-06 DIAGNOSIS — G9340 Encephalopathy, unspecified: Secondary | ICD-10-CM | POA: Diagnosis not present

## 2018-10-06 DIAGNOSIS — D649 Anemia, unspecified: Secondary | ICD-10-CM | POA: Diagnosis not present

## 2018-10-06 DIAGNOSIS — R509 Fever, unspecified: Secondary | ICD-10-CM | POA: Diagnosis not present

## 2018-10-06 DIAGNOSIS — D61818 Other pancytopenia: Secondary | ICD-10-CM | POA: Diagnosis not present

## 2018-10-06 DIAGNOSIS — R042 Hemoptysis: Secondary | ICD-10-CM | POA: Diagnosis not present

## 2018-10-06 DIAGNOSIS — J9600 Acute respiratory failure, unspecified whether with hypoxia or hypercapnia: Secondary | ICD-10-CM | POA: Diagnosis not present

## 2018-10-06 DIAGNOSIS — Z452 Encounter for adjustment and management of vascular access device: Secondary | ICD-10-CM | POA: Diagnosis not present

## 2018-10-06 DIAGNOSIS — R591 Generalized enlarged lymph nodes: Secondary | ICD-10-CM | POA: Diagnosis not present

## 2018-10-06 DIAGNOSIS — N179 Acute kidney failure, unspecified: Secondary | ICD-10-CM | POA: Diagnosis not present

## 2018-10-06 DIAGNOSIS — F419 Anxiety disorder, unspecified: Secondary | ICD-10-CM | POA: Diagnosis present

## 2018-10-06 DIAGNOSIS — E875 Hyperkalemia: Secondary | ICD-10-CM | POA: Diagnosis not present

## 2018-10-06 DIAGNOSIS — Z9911 Dependence on respirator [ventilator] status: Secondary | ICD-10-CM | POA: Diagnosis not present

## 2018-10-06 DIAGNOSIS — K3189 Other diseases of stomach and duodenum: Secondary | ICD-10-CM | POA: Diagnosis not present

## 2018-10-06 DIAGNOSIS — J984 Other disorders of lung: Secondary | ICD-10-CM | POA: Diagnosis not present

## 2018-10-06 DIAGNOSIS — A419 Sepsis, unspecified organism: Secondary | ICD-10-CM | POA: Diagnosis not present

## 2018-10-06 LAB — CULTURE, BLOOD (ROUTINE X 2)
Culture: NO GROWTH
Special Requests: ADEQUATE

## 2018-10-06 LAB — CBC
HCT: 24.6 % — ABNORMAL LOW (ref 36.0–46.0)
HCT: 21.5 % — ABNORMAL LOW (ref 36.0–46.0)
Hemoglobin: 7.9 g/dL — ABNORMAL LOW (ref 12.0–15.0)
Hemoglobin: 6.8 g/dL — CL (ref 12.0–15.0)
MCH: 30.2 pg (ref 26.0–34.0)
MCH: 29.8 pg (ref 26.0–34.0)
MCHC: 32.1 g/dL (ref 30.0–36.0)
MCHC: 31.6 g/dL (ref 30.0–36.0)
MCV: 93.9 fL (ref 80.0–100.0)
MCV: 94.3 fL (ref 80.0–100.0)
Platelets: 9 10*3/uL — CL (ref 150–400)
Platelets: 17 10*3/uL — CL (ref 150–400)
RBC: 2.62 MIL/uL — ABNORMAL LOW (ref 3.87–5.11)
RBC: 2.28 MIL/uL — ABNORMAL LOW (ref 3.87–5.11)
RDW: 19.4 % — ABNORMAL HIGH (ref 11.5–15.5)
RDW: 19.7 % — ABNORMAL HIGH (ref 11.5–15.5)
WBC: 20.9 10*3/uL — ABNORMAL HIGH (ref 4.0–10.5)
WBC: 21.2 10*3/uL — ABNORMAL HIGH (ref 4.0–10.5)
nRBC: 1.9 % — ABNORMAL HIGH (ref 0.0–0.2)
nRBC: 2.1 % — ABNORMAL HIGH (ref 0.0–0.2)

## 2018-10-06 LAB — COMPREHENSIVE METABOLIC PANEL
ALT: 11 U/L (ref 0–44)
AST: 28 U/L (ref 15–41)
Albumin: 1.6 g/dL — ABNORMAL LOW (ref 3.5–5.0)
Alkaline Phosphatase: 191 U/L — ABNORMAL HIGH (ref 38–126)
Anion gap: 9 (ref 5–15)
BUN: 62 mg/dL — ABNORMAL HIGH (ref 6–20)
CO2: 16 mmol/L — ABNORMAL LOW (ref 22–32)
Calcium: 7.6 mg/dL — ABNORMAL LOW (ref 8.9–10.3)
Chloride: 112 mmol/L — ABNORMAL HIGH (ref 98–111)
Creatinine, Ser: 1.57 mg/dL — ABNORMAL HIGH (ref 0.44–1.00)
GFR calc Af Amer: 50 mL/min — ABNORMAL LOW (ref >60)
GFR calc non Af Amer: 43 mL/min — ABNORMAL LOW (ref >60)
Glucose, Bld: 134 mg/dL — ABNORMAL HIGH (ref 70–99)
Potassium: 4.3 mmol/L (ref 3.5–5.1)
Sodium: 137 mmol/L (ref 135–145)
Total Bilirubin: 3.1 mg/dL — ABNORMAL HIGH (ref 0.3–1.2)
Total Protein: 6 g/dL — ABNORMAL LOW (ref 6.5–8.1)

## 2018-10-06 LAB — LACTIC ACID, PLASMA
Lactic Acid, Venous: 2.6 mmol/L (ref 0.5–1.9)
Lactic Acid, Venous: 1.2 mmol/L (ref 0.5–1.9)

## 2018-10-06 LAB — ECHOCARDIOGRAM LIMITED
Height: 61 in
Weight: 1954.16 oz

## 2018-10-06 LAB — BLOOD GAS, ARTERIAL
Acid-base deficit: 9.6 mmol/L — ABNORMAL HIGH (ref 0.0–2.0)
Bicarbonate: 13.1 mmol/L — ABNORMAL LOW (ref 20.0–28.0)
Drawn by: 308601
O2 Content: 4 L/min
O2 Saturation: 98.4 %
Patient temperature: 98.6
pCO2 arterial: 19.6 mmHg — CL (ref 32.0–48.0)
pH, Arterial: 7.44 (ref 7.350–7.450)
pO2, Arterial: 132 mmHg — ABNORMAL HIGH (ref 83.0–108.0)

## 2018-10-06 LAB — POCT I-STAT, CHEM 8
BUN: 54 mg/dL — ABNORMAL HIGH (ref 6–20)
Calcium, Ion: 1.13 mmol/L — ABNORMAL LOW (ref 1.15–1.40)
Chloride: 112 mmol/L — ABNORMAL HIGH (ref 98–111)
Creatinine, Ser: 1.5 mg/dL — ABNORMAL HIGH (ref 0.44–1.00)
Glucose, Bld: 130 mg/dL — ABNORMAL HIGH (ref 70–99)
HCT: 23 % — ABNORMAL LOW (ref 36.0–46.0)
Hemoglobin: 7.8 g/dL — ABNORMAL LOW (ref 12.0–15.0)
Potassium: 4.2 mmol/L (ref 3.5–5.1)
Sodium: 138 mmol/L (ref 135–145)
TCO2: 15 mmol/L — ABNORMAL LOW (ref 22–32)

## 2018-10-06 LAB — PREPARE RBC (CROSSMATCH)

## 2018-10-06 LAB — HEMOGLOBIN AND HEMATOCRIT, BLOOD
HCT: 36 % (ref 36.0–46.0)
Hemoglobin: 12 g/dL (ref 12.0–15.0)

## 2018-10-06 LAB — I-STAT BETA HCG BLOOD, ED (NOT ORDERABLE): I-stat hCG, quantitative: <5 m[IU]/mL (ref <5)

## 2018-10-06 LAB — PROTIME-INR
INR: 1.4 — ABNORMAL HIGH (ref 0.8–1.2)
Prothrombin Time: 17.2 seconds — ABNORMAL HIGH (ref 11.4–15.2)

## 2018-10-06 LAB — SARS CORONAVIRUS 2 BY RT PCR (HOSPITAL ORDER, PERFORMED IN ~~LOC~~ HOSPITAL LAB): SARS Coronavirus 2: NEGATIVE

## 2018-10-06 MED ORDER — ATOVAQUONE 750 MG/5ML PO SUSP
1500.0000 mg | Freq: Every day | ORAL | Status: DC
  Administered 2018-10-07 – 2018-10-23 (×17): 1500 mg via ORAL
  Filled 2018-10-06 (×19): qty 10

## 2018-10-06 MED ORDER — DIAZEPAM 5 MG/ML IJ SOLN
2.5000 mg | INTRAMUSCULAR | Status: DC | PRN
  Administered 2018-10-06 – 2018-10-07 (×5): 2.5 mg via INTRAVENOUS
  Filled 2018-10-06 (×5): qty 2

## 2018-10-06 MED ORDER — SODIUM CHLORIDE 0.9% IV SOLUTION
Freq: Once | INTRAVENOUS | Status: AC
  Administered 2018-10-06: 13:00:00 via INTRAVENOUS

## 2018-10-06 MED ORDER — MORPHINE SULFATE (PF) 2 MG/ML IV SOLN: 2.0000 mg | Freq: Once | INTRAVENOUS | Status: DC

## 2018-10-06 MED ORDER — SULFAMETHOXAZOLE-TRIMETHOPRIM 400-80 MG/5ML IV SOLN
260.0000 mg | Freq: Three times a day (TID) | INTRAVENOUS | Status: DC
  Administered 2018-10-06: 260 mg via INTRAVENOUS
  Filled 2018-10-06 (×3): qty 16.25

## 2018-10-06 MED ORDER — ALBUTEROL SULFATE (2.5 MG/3ML) 0.083% IN NEBU: 2.5000 mg | INHALATION_SOLUTION | Freq: Four times a day (QID) | RESPIRATORY_TRACT | Status: DC | PRN

## 2018-10-06 MED ORDER — SODIUM CHLORIDE 0.9 % IV SOLN
2.0000 g | Freq: Three times a day (TID) | INTRAVENOUS | Status: DC
  Administered 2018-10-06: 2 g via INTRAVENOUS
  Filled 2018-10-06: qty 2

## 2018-10-06 MED ORDER — CHLORHEXIDINE GLUCONATE 0.12 % MT SOLN
15.0000 mL | Freq: Two times a day (BID) | OROMUCOSAL | Status: DC
  Administered 2018-10-07 – 2018-10-14 (×7): 15 mL via OROMUCOSAL
  Filled 2018-10-06 (×5): qty 15

## 2018-10-06 MED ORDER — DM-GUAIFENESIN ER 30-600 MG PO TB12
1.0000 | ORAL_TABLET | Freq: Two times a day (BID) | ORAL | Status: DC
  Administered 2018-10-07 – 2018-10-09 (×5): 1 via ORAL
  Filled 2018-10-06 (×5): qty 1

## 2018-10-06 MED ORDER — SODIUM CHLORIDE 0.9 % IV SOLN
2.0000 g | Freq: Two times a day (BID) | INTRAVENOUS | Status: DC
  Administered 2018-10-06 – 2018-10-08 (×4): 2 g via INTRAVENOUS
  Filled 2018-10-06 (×4): qty 2

## 2018-10-06 MED ORDER — BENZONATATE 100 MG PO CAPS
100.0000 mg | ORAL_CAPSULE | Freq: Three times a day (TID) | ORAL | Status: DC
  Administered 2018-10-15 – 2018-10-23 (×3): 100 mg via ORAL
  Filled 2018-10-06 (×13): qty 1

## 2018-10-06 MED ORDER — MORPHINE SULFATE (PF) 2 MG/ML IV SOLN
2.0000 mg | INTRAVENOUS | Status: DC | PRN
  Administered 2018-10-06 – 2018-10-07 (×4): 2 mg via INTRAVENOUS
  Filled 2018-10-06 (×4): qty 1

## 2018-10-06 MED ORDER — ROMIPLOSTIM 250 MCG ~~LOC~~ SOLR
2.0000 ug/kg | Freq: Once | SUBCUTANEOUS | Status: AC
  Administered 2018-10-06: 11:00:00 105 ug via SUBCUTANEOUS
  Filled 2018-10-06: qty 0.21

## 2018-10-06 MED ORDER — BICTEGRAVIR-EMTRICITAB-TENOFOV 50-200-25 MG PO TABS
1.0000 | ORAL_TABLET | Freq: Every day | ORAL | Status: DC
  Administered 2018-10-07 – 2018-10-08 (×2): 1 via ORAL
  Filled 2018-10-06 (×3): qty 1

## 2018-10-06 MED ORDER — ORAL CARE MOUTH RINSE
15.0000 mL | Freq: Two times a day (BID) | OROMUCOSAL | Status: DC
  Administered 2018-10-06 – 2018-10-07 (×2): 15 mL via OROMUCOSAL

## 2018-10-06 MED ORDER — SODIUM CHLORIDE 0.45 % IV SOLN
INTRAVENOUS | Status: DC
  Administered 2018-10-06: 50 mL/h via INTRAVENOUS

## 2018-10-06 MED ORDER — FENTANYL CITRATE (PF) 100 MCG/2ML IJ SOLN
50.0000 ug | Freq: Once | INTRAMUSCULAR | Status: AC
  Administered 2018-10-06: 50 ug via INTRAVENOUS
  Filled 2018-10-06: qty 2

## 2018-10-06 MED ORDER — SODIUM CHLORIDE 0.9 % IV SOLN
INTRAVENOUS | Status: DC | PRN
  Administered 2018-10-06 – 2018-10-09 (×3): 250 mL via INTRAVENOUS
  Administered 2018-10-10: 13:00:00 500 mL via INTRAVENOUS

## 2018-10-06 MED ORDER — SODIUM CHLORIDE 0.9% FLUSH
10.0000 mL | Freq: Two times a day (BID) | INTRAVENOUS | Status: DC
  Administered 2018-10-06 – 2018-10-17 (×20): 10 mL

## 2018-10-06 MED ORDER — METHYLPREDNISOLONE SODIUM SUCC 125 MG IJ SOLR
80.0000 mg | Freq: Two times a day (BID) | INTRAMUSCULAR | Status: DC
  Administered 2018-10-06 – 2018-10-11 (×11): 80 mg via INTRAVENOUS
  Filled 2018-10-06 (×11): qty 2

## 2018-10-06 MED ORDER — ACETAMINOPHEN 325 MG PO TABS
650.0000 mg | ORAL_TABLET | Freq: Once | ORAL | Status: AC
  Administered 2018-10-06: 650 mg via ORAL
  Filled 2018-10-06: qty 2

## 2018-10-06 MED ORDER — HYDROCOD POLST-CPM POLST ER 10-8 MG/5ML PO SUER: 5.0000 mL | Freq: Two times a day (BID) | ORAL | Status: DC | PRN

## 2018-10-06 MED ORDER — ALBUMIN HUMAN 25 % IV SOLN
25.0000 g | Freq: Once | INTRAVENOUS | Status: AC
  Administered 2018-10-06: 25 g via INTRAVENOUS
  Filled 2018-10-06: qty 50

## 2018-10-06 MED ORDER — DIAZEPAM 2 MG PO TABS: 2.0000 mg | ORAL_TABLET | ORAL | Status: DC | PRN

## 2018-10-06 MED ORDER — SODIUM CHLORIDE 0.9% FLUSH: 10.0000 mL | INTRAVENOUS | Status: DC | PRN

## 2018-10-06 MED ORDER — CHLORHEXIDINE GLUCONATE CLOTH 2 % EX PADS
6.0000 | MEDICATED_PAD | Freq: Every day | CUTANEOUS | Status: DC
  Administered 2018-10-06 – 2018-10-23 (×18): 6 via TOPICAL

## 2018-10-06 MED ORDER — AZITHROMYCIN 600 MG PO TABS
1200.0000 mg | ORAL_TABLET | ORAL | Status: DC
  Administered 2018-10-08 – 2018-10-22 (×3): 1200 mg via ORAL
  Filled 2018-10-06 (×3): qty 2

## 2018-10-06 MED ORDER — SODIUM CHLORIDE 0.9 % IV BOLUS
1000.0000 mL | Freq: Once | INTRAVENOUS | Status: AC
  Administered 2018-10-06: 04:00:00 1000 mL via INTRAVENOUS

## 2018-10-06 MED ORDER — ALBUTEROL SULFATE (2.5 MG/3ML) 0.083% IN NEBU
2.5000 mg | INHALATION_SOLUTION | RESPIRATORY_TRACT | Status: DC | PRN
  Filled 2018-10-06: qty 3

## 2018-10-06 MED ORDER — SODIUM CHLORIDE 0.9% IV SOLUTION: Freq: Once | INTRAVENOUS | Status: DC

## 2018-10-06 MED ORDER — IPRATROPIUM-ALBUTEROL 0.5-2.5 (3) MG/3ML IN SOLN
3.0000 mL | Freq: Once | RESPIRATORY_TRACT | Status: AC
  Administered 2018-10-06: 3 mL via RESPIRATORY_TRACT
  Filled 2018-10-06: qty 3

## 2018-10-06 MED ORDER — ALBUTEROL SULFATE HFA 108 (90 BASE) MCG/ACT IN AERS: 2.0000 | INHALATION_SPRAY | Freq: Four times a day (QID) | RESPIRATORY_TRACT | Status: DC | PRN

## 2018-10-06 MED ORDER — MOMETASONE FURO-FORMOTEROL FUM 100-5 MCG/ACT IN AERO
2.0000 | INHALATION_SPRAY | Freq: Two times a day (BID) | RESPIRATORY_TRACT | Status: DC
  Administered 2018-10-08 – 2018-10-23 (×29): 2 via RESPIRATORY_TRACT
  Filled 2018-10-06 (×3): qty 8.8

## 2018-10-06 NOTE — Consult Note (Addendum)
PULMONARY / CRITICAL CARE MEDICINE   NAME:  Krystal Short, MRN:  [id number], DOB:  [birth year], LOS: 0 ADMISSION DATE:  10/05/2018, CONSULTATION DATE:  8/30 REFERRING MD:  [place]/ [facility], CHIEF COMPLAINT:  sob  BRIEF HISTORY:    3 yobf  Quit smoking 12/2016 s resp problems then  but dx with HIV April 2019 presented with pna and breathing "never right since"  With recurrent pattern of pulmonary infiltrates presumed PCP responsive to rx by ID which includes steroids with cxr almost nl on 09/03/18 and 09/19/18 cxrs  also being used for severe thrombocytopenia and back to ER pm 8/29 with same pattern of progressive sob x 3-4 days PTA with min dry cough  assoc with recurrent diffuse pulmonary infiltrates 1st noted 10/01/18  so placed on 02/bipap and PCCM service consulted      HISTORY OF PRESENT ILLNESS   > 06/14/2018-07/16/2018 Bordetella bronchus infection.  Underwent FOB 07/12/2018 s PCP studies  Biopsy scant lung parenchyma with fibrosis and reactive changes. Required 4 PRBC and 3 platelet infusion. >07/31/2026-08/22/2018 sepsis from suspected PJP pneumonia.  Treated with steroids clindamycin and primaquine. Required 8 PRBC and 7 platelet transfusion.  RVP positive for routine coronavirus. > 09/03/2018-09/13/2018 generalized weakness and deconditioning.  Required 3 PRBC 8 platelet transfusion. IVIG on 09/05/2018 without any improvement in platelet count.  Given prolonged prednisone taper. 09/11/18 BM Bx all micro studies neg  > 10/01/2018-10/04/2018 presented with anemia and poor IV access.  Given 2 PRBC 1 platelet transfusion.  Required pressors.  Treated with ceftriaxone and doxy for ?  CAP.  No obvious patterns in day to day or daytime variability or assoc excess/ purulent sputum or mucus plugs or hemoptysis or cp or chest tightness, subjective wheeze or overt sinus or hb symptoms.    . Also denies any obvious fluctuation of symptoms with weather or environmental changes or other aggravating or alleviating  factors except as outlined above   No unusual exposure hx or h/o childhood pna/ asthma or knowledge of premature birth.  Current Allergies, Complete Past Medical History, Past Surgical History, Family History, and Social History were reviewed in [employer] record.  ROS  The following are not active complaints unless bolded Hoarseness, sore throat, dysphagia, dental problems, itching, sneezing,  nasal congestion or discharge of excess mucus or purulent secretions, ear ache,   fever, chills, sweats, unintended wt loss or wt gain, classically pleuritic or exertional cp,  orthopnea pnd or arm/hand swelling  or leg swelling, presyncope, palpitations, abdominal pain, anorexia, nausea, vomiting, diarrhea  or change in bowel habits or change in bladder habits, change in stools or change in urine, dysuria, hematuria,  rash, arthralgias, visual complaints, headache, numbness, weakness or ataxia or problems with walking or coordination,  change in mood or  memory.             SIGNIFICANT EVENTS:   started solumedrol 8/30 am @ 80 mg q 12h  STUDIES:     CULTURES:  COVID 19 pcr 8/29 neg BC x 2  8/29 >>> PCP by dfa sputum 8/30 >>>  ANTIBIOTICS per ID as of 8/30:   Bactrim 8/29 >>>  Maxepime 8/29 >>>  LINES/TUBES:  R IJ CVL in ER  8/30 >>   CONSULTANTS:  PCCM  8/30 ID    8/30  SUBJECTIVE:  Sob at rest, better on bipap p MS/ min dry cough   CONSTITUTIONAL: BP 99/62   Pulse (!) 107   Temp (!) 97.4 F (36.3 C) (Axillary)  Resp (!) 35   Ht _0  (1.549 m)   Wt 55.4 kg   LMP  (LMP Unknown) Comment: pt is on depo  SpO2 97%   BMI 23.08 kg/m   No intake/output data recorded.     FiO2 (%):  [50 %] 50 %  PHYSICAL EXAM: General:  Acutely and chronically ill appearing bf  Neuro:  Alert, no deficits HEENT:  orophx clear  Cardiovascular:  RRR no s3  Lungs:  Coarse bs bilaterally with  Abdomen:  Soft/ non tender Musculoskeletal:  No calf tenderness Skin:  No  skin breakdown    I personally reviewed images and agree with radiology impression as follows:  pCXR:   8/30 1. Interval placement of a right IJ central venous line with tip in the region of the cavoatrial junction. No pneumothorax. 2. No significant interval change in the bilateral airspace Opacities. My review: there has been a marked change with new GG changes that occurred between 8/13 and 10/01/18     BNP   10/02/18  =  276 LDH    10/02/18  = 352   Vs 255 10/01/2018     Lab Results  Component Value Date   ESRSEDRATE 100 (H) 10/02/2018   ESRSEDRATE >140 (H) 08/05/2018   ESRSEDRATE 140 (H) 08/02/2018    ASSESSMENT AND PLAN    1)  Acute / recurrent hypoxemic Resp failure in setting of diffuse recurrent pulmlnary infiltrates - see below - note marked hyperventilation (primary alkalosis) with nl hc03 response (secondary met acidosis with nl AG - actually due to low albumin) >>> rec bipap/02 / Mophine judiciously  2) recurrent bilateral pulmonary infiltrates flared with pred taper despite ? max maint rx for PCP  -  The high ldl, absence of typical crackles are c/w pcp but can't r/o boop or alv hem caused by extremely low plt - needs BAL to r/o other opportunistic organisms) when not risking intubation ? Can be done 8/31 if pcp smears on exp sputum neg   3) Profound persistent thrombocytopenia despite "nl bone marrow" suggests consumption issue > hematology following ? Steroid responsive (if so just setting her up longer term for more opportunistic organisms  4) Symptomatic anemia with nl BM hematopoesis suggests peripheral consumption - no evidence of alv hem so far.   5) Mild renal insuff ? Etiology? Partly related to vol depletion vs drug side effects, no evidence of a pulmonary/renal syndrome        >>>  rec in Short run solumderol 80 q2 h then regroup 8/31 re safety (with very low plts and risk - of vent dep resp failure) of proceeding to fob with BAL.     Discussed in  detail all the  indications, usual  risks and alternatives  relative to the benefits with patient who agrees to proceed with w/u  as outlined.        LABS  Glucose No results for input(s): GLUCAP in the last 168 hours.  BMET Recent Labs  Lab 10/03/18 0210 10/04/18 0514 10/05/18 0120 10/06/18 0300  NA 133* 134* 137 138  K 4.4 4.1 4.3 4.2  CL 109 110 112* 112*  CO2 11* 16* 16*  --   BUN 35* 43* 62* 54*  CREATININE 1.17* 1.05* 1.57* 1.50*  GLUCOSE 143* 111* 134* 130*    Liver Enzymes Recent Labs  Lab 10/03/18 0210 10/04/18 0514 10/05/18 0120  AST _1 ALT _2 ALKPHOS 146* 125 191*  BILITOT  2.3* 1.6* 3.1*  ALBUMIN 1.7* 1.7* 1.6*    Electrolytes Recent Labs  Lab 10/03/18 0210 10/04/18 0514 10/05/18 0120  CALCIUM 7.4* 7.5* 7.6*  MG 1.4* 2.3  --     CBC Recent Labs  Lab 10/04/18 0514 10/06/18 0136 10/06/18 0300 10/06/18 1153  WBC 20.4* 20.9*  --  21.2*  HGB 8.5* 7.9* 7.8* 6.8*  HCT 26.0* 24.6* 23.0* 21.5*  PLT 13* 9*  --  17*    ABG Recent Labs  Lab 10/05/18 2350  PHART 7.440  PCO2ART 19.6*  PO2ART 132*    Coag's Recent Labs  Lab 10/01/18 1832 10/05/18 0120  APTT 43*  --   INR 1.3* 1.4*    Sepsis Markers Recent Labs  Lab 10/02/18 1112  10/03/18 0556 10/05/18 2313 10/06/18 0113  LATICACIDVEN  --    < > 3.1* 2.6* 1.2  PROCALCITON 12.26  --   --   --   --    < > = values in this interval not displayed.    Cardiac Enzymes No results for input(s): TROPONINI, PROBNP in the last 168 hours.  PAST MEDICAL HISTORY :   She  has a past medical history of Acute hyponatremia (06/03/2017), Anemia, CAP (community acquired pneumonia) (06/03/2017), Facial dermatitis (06/03/2017), HIV (human immunodeficiency virus infection) ([place]), Pleurisy (06/03/2017), 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 ([place]), Thrush of mouth and esophagus ([place]), and UTI (urinary tract infection).  PAST SURGICAL HISTORY:  She  has a past surgical  history that includes No past surgeries; Endobronchial ultrasound (N/A, 07/12/2018); Video bronchoscopy (N/A, 07/12/2018); Fine Needle Aspiration Biopsy (07/12/2018); biopsy (07/12/2018); and Bronchial washings (07/12/2018).  Allergies  Allergen Reactions  . Heparin Other (See Comments)    Unknown reaction    No current facility-administered medications on file prior to encounter.    Current Outpatient Medications on File Prior to Encounter  Medication Sig  . albuterol (VENTOLIN HFA) 108 (90 Base) MCG/ACT inhaler Inhale 2 puffs into the lungs every 6 (six) hours as needed for wheezing or shortness of breath.  Marland Kitchen atovaquone (MEPRON) 750 MG/5ML suspension Take 10 mLs (1,500 mg total) by mouth daily.  Marland Kitchen azithromycin (ZITHROMAX) 600 MG tablet Take 2 tablets (1,200 mg total) by mouth once a week. (Patient taking differently: Take 1,200 mg by mouth every Tuesday. )  . bictegravir-emtricitabine-tenofovir AF (BIKTARVY) 50-200-25 MG TABS tablet Take 1 tablet by mouth daily.  . cefdinir (OMNICEF) 300 MG capsule Take 1 capsule (300 mg total) by mouth 2 (two) times daily for 3 days.  Marland Kitchen doxycycline (VIBRA-TABS) 100 MG tablet Take 1 tablet (100 mg total) by mouth 2 (two) times daily for 3 days.  . predniSONE (DELTASONE) 10 MG tablet Take 90m daily for 7 days,Take 349mdaily for 7 days,Take 2023maily for 7 days,Take 52m75mily for 7 days, Take 52mg78mry other day for 7 days  then stop (Patient taking differently: Take 10-40 mg by mouth See admin instructions. Take 40mg 71my for 7 days,Take 30mg d14m for 7 days,Take 20mg da48mfor 7 days,Take 52mg dai55mor 7 days, Take 52mg ever53mher day for 7 days  then stop)  . benzonatate (TESSALON) 100 MG capsule Take 1 capsule (100 mg total) by mouth 3 (three) times daily.  . dextromeMarland Kitchenhorphan-guaiFENesin (MUCINEX DM) 30-600 MG 12hr tablet Take 1 tablet by mouth 2 (two) times daily.    FAMILY HISTORY:   Her family history includes Asthma in her paternal grandmother; Healthy  in her father and mother.  SOCIAL HISTORY:  She  reports that she quit smoking about 21 months ago. Her smoking use included cigarettes and cigars. She has a 2.00 pack-year smoking history. She has never used smokeless tobacco. She reports current alcohol use. She reports that she does not use drugs.    The patient is critically ill with multiple organ systems failure and requires high complexity decision making for assessment and support, frequent evaluation and titration of therapies, application of advanced monitoring technologies and extensive interpretation of multiple databases. Critical Care Time devoted to patient care services described in this note is 60 minutes.       Christinia Gully, MD Pulmonary and [place] [phone] After 5:30 PM or weekends, use [facility] [phone]

## 2018-10-06 NOTE — ED Notes (Signed)
Lactic 2.6 

## 2018-10-06 NOTE — Progress Notes (Addendum)
Patient's husband, Marya Amsler, at bedside. Valuables policy reviewed with Marya Amsler. Marya Amsler will take patient's bracelet and ring home with him.

## 2018-10-06 NOTE — ED Provider Notes (Signed)
[facility] DEPT Provider Note  CSN: [id number] Arrival date & time: 10/05/18 2224  Chief Complaint(s) Shortness of Breath  HPI Krystal Short is a 33 y.o. female with a past medical history listed below including AIDS who was recently discharged from the hospital after being admitted for symptomatic anemia requiring blood transfusions and also found to have diffuse atypical opacities on chest x-ray and treated with doxycycline.  She presents today with worsening shortness of breath after being discharged.  She reports that she was feeling fatigued but improved after being discharged.  Due to deconditioning she has been trying to get up and walk.  When she did so this afternoon she began having shortness of breath which continues to worsen.  She denies any known fevers.  She has no chest pain.  There is no nausea or vomiting.  No abdominal pain.  No prior history of DVTs or PEs.  HPI  Past Medical History Past Medical History:  Diagnosis Date  . Acute hyponatremia 06/03/2017  . Anemia   . CAP (community acquired pneumonia) 06/03/2017  . Facial dermatitis 06/03/2017  . HIV (human immunodeficiency virus infection) ([place])    diagnosed in April 2019  . Pleurisy 06/03/2017  . 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 ([place])   . Thrush of mouth and esophagus ([place])   . UTI (urinary tract infection)    Patient Active Problem List   Diagnosis Date Noted  . PCP (pneumocystis jiroveci pneumonia) ([place]) 10/06/2018  . Leukocytosis 10/01/2018  . Hypotension 10/01/2018  . Alteration in performance of activities of daily living 09/18/2018  . Anemia   . Sepsis due to pneumonia ([place]) 09/03/2018  . Pancytopenia, acquired ([place])   . Elevated bilirubin   . AKI (acute kidney injury) ([place])   . Acute respiratory failure with hypoxemia ([place]) 08/03/2018  . Chest pain   . Fever 08/02/2018  . HCAP (healthcare-associated pneumonia) 08/02/2018  . Sepsis ([place]) 08/02/2018   . Hemophagocytosis present in bone marrow ([place])   . Pulmonary infiltrates 07/08/2018  . Hilar adenopathy 07/08/2018  . Acute respiratory failure with hypoxia ([place]) 07/04/2018  . Symptomatic anemia 06/10/2018  . Thrombocytopenia ([place]) 06/10/2018  . ASCUS with positive high risk HPV cervical 06/10/2018  . Pneumonia 05/28/2018  . Suspected pneumocystis pneumonia ([place]) 03/08/2018  . Medication monitoring encounter 12/31/2017  . Molluscum contagiosum 06/27/2017  . AIDS (acquired immune deficiency syndrome) ([place])   . Esophageal candidiasis ([place])   . Hyponatremia 06/03/2017   Home Medication(s) Prior to Admission medications   Medication Sig Start Date End Date Taking? Authorizing Provider  albuterol (VENTOLIN HFA) 108 (90 Base) MCG/ACT inhaler Inhale 2 puffs into the lungs every 6 (six) hours as needed for wheezing or shortness of breath.   Yes [provider]  atovaquone (MEPRON) 750 MG/5ML suspension Take 10 mLs (1,500 mg total) by mouth daily. 09/18/18 10/18/18 Yes Golden Circle, FNP  azithromycin (ZITHROMAX) 600 MG tablet Take 2 tablets (1,200 mg total) by mouth once a week. Patient taking differently: Take 1,200 mg by mouth every Tuesday.  09/17/18  Yes Oswald Hillock, MD  bictegravir-emtricitabine-tenofovir AF (BIKTARVY) 50-200-25 MG TABS tablet Take 1 tablet by mouth daily. 09/18/18  Yes Golden Circle, FNP  cefdinir (OMNICEF) 300 MG capsule Take 1 capsule (300 mg total) by mouth 2 (two) times daily for 3 days. 10/04/18 10/07/18 Yes Lavina Hamman, MD  doxycycline (VIBRA-TABS) 100 MG tablet Take 1 tablet (100 mg total) by mouth 2 (two) times  daily for 3 days. 10/04/18 10/07/18 Yes Lavina Hamman, MD  predniSONE (DELTASONE) 10 MG tablet Take 40mg  daily for 7 days,Take 30mg  daily for 7 days,Take 20mg  daily for 7 days,Take 10mg  daily for 7 days, Take 10mg  every other day for 7 days  then stop Patient taking differently: Take 10-40 mg by mouth See admin instructions. Take 40mg  daily  for 7 days,Take 30mg  daily for 7 days,Take 20mg  daily for 7 days,Take 10mg  daily for 7 days, Take 10mg  every other day for 7 days  then stop 10/04/18  Yes Lavina Hamman, MD  benzonatate (TESSALON) 100 MG capsule Take 1 capsule (100 mg total) by mouth 3 (three) times daily. 10/04/18   Lavina Hamman, MD  dextromethorphan-guaiFENesin [facility] DM) 30-600 MG 12hr tablet Take 1 tablet by mouth 2 (two) times daily. 10/04/18   Lavina Hamman, MD                                                                                                                                    Past Surgical History Past Surgical History:  Procedure Laterality Date  . BIOPSY  07/12/2018   Procedure: BIOPSY;  Surgeon: Laurin Coder, MD;  Location: [facility];  Service: Endoscopy;;  . BRONCHIAL WASHINGS  07/12/2018   Procedure: BRONCHIAL WASHINGS;  Surgeon: Laurin Coder, MD;  Location: [facility];  Service: Endoscopy;;  . ENDOBRONCHIAL ULTRASOUND N/A 07/12/2018   Procedure: ENDOBRONCHIAL ULTRASOUND;  Surgeon: Laurin Coder, MD;  Location: [facility];  Service: Endoscopy;  Laterality: N/A;  . FINE NEEDLE ASPIRATION BIOPSY  07/12/2018   Procedure: FINE NEEDLE ASPIRATION BIOPSY;  Surgeon: Laurin Coder, MD;  Location: [facility];  Service: Endoscopy;;  . NO PAST SURGERIES    . VIDEO BRONCHOSCOPY N/A 07/12/2018   Procedure: VIDEO BRONCHOSCOPY WITHOUT FLUORO;  Surgeon: Laurin Coder, MD;  Location: [facility];  Service: Endoscopy;  Laterality: N/A;   Family History Family History  Problem Relation Age of Onset  . Healthy Father   . Healthy Mother   . Asthma Paternal Grandmother     Social History Social History   Tobacco Use  . Smoking status: Former Smoker    Packs/day: 0.25    Years: 8.00    Pack years: 2.00    Types: Cigarettes, Cigars    Quit date: 12/25/2016    Years since quitting: 1.7  . Smokeless tobacco: Never Used  Substance Use Topics  . Alcohol use: Yes    Comment:  weekend/socially  . Drug use: No   Allergies Heparin  Review of Systems Review of Systems All other systems are reviewed and are negative for acute change except as noted in the HPI  Physical Exam Vital Signs  I have reviewed the triage vital signs BP (!) 109/57   Pulse (!) 132   Temp (!) 97.4 F (36.3 C) (Axillary)   Resp (!) 36   Ht 5'  1" (1.549 m)   Wt 51.3 kg   LMP  (LMP Unknown) Comment: pt is on depo  SpO2 100%   BMI 21.35 kg/m   Physical Exam Vitals signs reviewed.  Constitutional:      General: She is in acute distress.     Appearance: She is well-developed. She is ill-appearing. She is not diaphoretic.  HENT:     Head: Normocephalic and atraumatic.     Nose: Nose normal.  Eyes:     General: No scleral icterus.       Right eye: No discharge.        Left eye: No discharge.     Conjunctiva/sclera: Conjunctivae normal.     Pupils: Pupils are equal, round, and reactive to light.  Neck:     Musculoskeletal: Normal range of motion and neck supple.  Cardiovascular:     Rate and Rhythm: Regular rhythm. Tachycardia present.     Heart sounds: No murmur. No friction rub. No gallop.   Pulmonary:     Effort: Tachypnea and respiratory distress present.     Breath sounds: No stridor. Examination of the right-upper field reveals wheezing and rales. Examination of the left-upper field reveals wheezing and rales. Examination of the right-middle field reveals wheezing and rales. Examination of the left-middle field reveals wheezing and rales. Examination of the right-lower field reveals wheezing and rales. Examination of the left-lower field reveals wheezing and rales. Wheezing and rales present.  Abdominal:     General: There is no distension.     Palpations: Abdomen is soft.     Tenderness: There is no abdominal tenderness.  Musculoskeletal:        General: No tenderness.  Skin:    General: Skin is warm and dry.     Findings: No erythema or rash.     Comments: Molluscum  rash   Neurological:     Mental Status: She is alert and oriented to person, place, and time.     ED Results and Treatments Labs (all labs ordered are listed, but only abnormal results are displayed) Labs Reviewed  COMPREHENSIVE METABOLIC PANEL - Abnormal; Notable for the following components:      Result Value   Chloride 112 (*)    CO2 16 (*)    Glucose, Bld 134 (*)    BUN 62 (*)    Creatinine, Ser 1.57 (*)    Calcium 7.6 (*)    Total Protein 6.0 (*)    Albumin 1.6 (*)    Alkaline Phosphatase 191 (*)    Total Bilirubin 3.1 (*)    GFR calc non Af Amer 43 (*)    GFR calc Af Amer 50 (*)    All other components within normal limits  LACTIC ACID, PLASMA - Abnormal; Notable for the following components:   Lactic Acid, Venous 2.6 (*)    All other components within normal limits  PROTIME-INR - Abnormal; Notable for the following components:   Prothrombin Time 17.2 (*)    INR 1.4 (*)    All other components within normal limits  BLOOD GAS, ARTERIAL - Abnormal; Notable for the following components:   pCO2 arterial 19.6 (*)    pO2, Arterial 132 (*)    Bicarbonate 13.1 (*)    Acid-base deficit 9.6 (*)    All other components within normal limits  CBC - Abnormal; Notable for the following components:   WBC 20.9 (*)    RBC 2.62 (*)    Hemoglobin 7.9 (*)  HCT 24.6 (*)    RDW 19.4 (*)    Platelets 9 (*)    nRBC 1.9 (*)    All other components within normal limits  POCT I-STAT, CHEM 8 - Abnormal; Notable for the following components:   Chloride 112 (*)    BUN 54 (*)    Creatinine, Ser 1.50 (*)    Glucose, Bld 130 (*)    Calcium, Ion 1.13 (*)    TCO2 15 (*)    Hemoglobin 7.8 (*)    HCT 23.0 (*)    All other components within normal limits  SARS CORONAVIRUS 2 (HOSPITAL ORDER, [facility])  CULTURE, BLOOD (ROUTINE X 2)  CULTURE, BLOOD (ROUTINE X 2)  PNEUMOCYSTIS JIROVECI SMEAR BY DFA  LACTIC ACID, PLASMA  CBC WITH DIFFERENTIAL/PLATELET   URINALYSIS, ROUTINE W REFLEX MICROSCOPIC  PREGNANCY, URINE  I-STAT BETA HCG BLOOD, ED ([facility], [facility], [facility] ONLY)  I-STAT CHEM 8, ED  I-STAT BETA HCG BLOOD, ED (NOT ORDERABLE)  PREPARE PLATELET PHERESIS  TYPE AND SCREEN                                                                                                                         EKG  EKG Interpretation  Date/Time:    Ventricular Rate:    PR Interval:    QRS Duration:   QT Interval:    QTC Calculation:   R Axis:     Text Interpretation:        Radiology Dg Chest 2 View  Result Date: 10/05/2018 CLINICAL DATA:  Suspected sepsis EXAM: CHEST - 2 VIEW COMPARISON:  10/02/2018, 10/01/2018, 09/19/2018, 05/28/2018, 06/03/2017, 03/11/2013, 10/15/2017, 07/12/2018 FINDINGS: Small right-sided pleural effusion. Underlying reticular interstitial changes suggesting component of chronic disease, however there is interim worsening of bilateral right greater than left airspace disease, suspicious for acute edema or pneumonia superimposed on chronic underlying disease. Stable cardiomediastinal silhouette. No pneumothorax. IMPRESSION: 1. Interval worsening of bilateral right greater than left airspace disease, suspicious for acute edema or pneumonia superimposed on underlying chronic disease. Small right-sided pleural effusion. Electronically Signed   By: Donavan Foil M.D.   On: 10/05/2018 23:52   Dg Chest Port 1 View  Result Date: 10/06/2018 CLINICAL DATA:  33 year old female with centralized placement. EXAM: PORTABLE CHEST 1 VIEW COMPARISON:  Chest radiograph dated 10/05/2018 FINDINGS: Right IJ central venous line with tip over the cavoatrial junction. No pneumothorax. Bilateral airspace opacities similar to prior radiograph. There is a small right pleural effusion. No acute osseous pathology. IMPRESSION: 1. Interval placement of a right IJ central venous line with tip in the region of the cavoatrial junction. No pneumothorax. 2. No significant interval  change in the bilateral airspace opacities. Electronically Signed   By: Anner Crete M.D.   On: 10/06/2018 04:04    Pertinent labs & imaging results that were available during my care of the patient were reviewed by me and considered in my medical decision making (see chart for  details).  Medications Ordered in ED Medications  atovaquone (MEPRON) 750 MG/5ML suspension 1,500 mg (has no administration in time range)  azithromycin (ZITHROMAX) tablet 1,200 mg (has no administration in time range)  bictegravir-emtricitabine-tenofovir AF (BIKTARVY) 50-200-25 MG per tablet 1 tablet (has no administration in time range)  benzonatate (TESSALON) capsule 100 mg (has no administration in time range)  dextromethorphan-guaiFENesin (MUCINEX DM) 30-600 MG per 12 hr tablet 1 tablet (has no administration in time range)  0.45 % sodium chloride infusion (50 mL/hr Intravenous New Bag/Given 10/06/18 0634)  0.9 %  sodium chloride infusion (Manually program via Guardrails IV Fluids) (has no administration in time range)  albuterol (PROVENTIL) (2.5 MG/3ML) 0.083% nebulizer solution 2.5 mg (has no administration in time range)  mometasone-formoterol (DULERA) 100-5 MCG/ACT inhaler 2 puff (2 puffs Inhalation Not Given 10/06/18 0806)  ceFEPIme (MAXIPIME) 2 g in sodium chloride 0.9 % 100 mL IVPB (has no administration in time range)  methylPREDNISolone sodium succinate (SOLU-MEDROL) 125 mg/2 mL injection 80 mg (80 mg Intravenous Given 10/06/18 0726)  diazepam (VALIUM) injection 2.5 mg (2.5 mg Intravenous Given 10/06/18 0727)  sodium chloride 0.9 % bolus 1,000 mL (0 mLs Intravenous Stopped 10/06/18 0609)  fentaNYL (SUBLIMAZE) injection 50 mcg (50 mcg Intravenous Given 10/06/18 0440)  acetaminophen (TYLENOL) tablet 650 mg (650 mg Oral Given 10/06/18 0634)  ipratropium-albuterol (DUONEB) 0.5-2.5 (3) MG/3ML nebulizer solution 3 mL (3 mLs Nebulization Given 10/06/18 0806)                                                                                                                                     Procedures .Central Line  Date/Time: 10/06/2018 3:19 AM Performed by: Fatima Blank, MD Authorized by: Fatima Blank, MD   Consent:    Consent obtained:  Verbal and emergent situation   Risks discussed:  Arterial puncture, bleeding, infection, nerve damage and pneumothorax   Alternatives discussed:  Delayed treatment Pre-procedure details:    Hand hygiene: Hand hygiene performed prior to insertion     Sterile barrier technique: All elements of maximal sterile technique followed     Skin preparation:  2% chlorhexidine   Skin preparation agent: Skin preparation agent completely dried prior to procedure   Anesthesia (see MAR for exact dosages):    Anesthesia method:  Local infiltration   Local anesthetic:  Lidocaine 1% w/o epi Procedure details:    Location:  R internal jugular   Patient position:  Trendelenburg   Procedural supplies:  Triple lumen   Catheter size:  7 Fr   Ultrasound guidance: yes     Sterile ultrasound techniques: Sterile gel and sterile probe covers were used     Number of attempts:  1   Successful placement: yes   Post-procedure details:    Post-procedure:  Dressing applied and line sutured   Assessment:  Blood return through all ports, free fluid flow, placement verified by x-ray and no pneumothorax  on x-ray   Patient tolerance of procedure:  Tolerated well, no immediate complications .Critical Care Performed by: Fatima Blank, MD Authorized by: Fatima Blank, MD    CRITICAL CARE Performed by: Grayce Sessions Danel Requena Total critical care time: 80 minutes Critical care time was exclusive of separately billable procedures and treating other patients. Critical care was necessary to treat or prevent imminent or life-threatening deterioration. Critical care was time spent personally by me on the following activities: development of treatment plan with patient  and/or surrogate as well as nursing, discussions with consultants, evaluation of patient's response to treatment, examination of patient, obtaining history from patient or surrogate, ordering and performing treatments and interventions, ordering and review of laboratory studies, ordering and review of radiographic studies, pulse oximetry and re-evaluation of patient's condition.    (including critical care time)  Medical Decision Making / ED Course I have reviewed the nursing notes for this encounter and the patient's prior records (if available in EHR or on provided paperwork).   Glorious Juniper was evaluated in Emergency Department on 10/06/2018 for the symptoms described in the history of present illness. She was evaluated in the context of the global COVID-19 pandemic, which necessitated consideration that the patient might be at risk for infection with the SARS-CoV-2 virus that causes COVID-19. Institutional protocols and algorithms that pertain to the evaluation of patients at risk for COVID-19 are in a state of rapid change based on information released by regulatory bodies including the CDC and federal and state organizations. These policies and algorithms were followed during the patient's care in the ED.  Patient in respiratory distress.  Compliant with antiretrovirals but unfortunately has AIDS.  Patient with diffuse rales and wheezing.  Chest x-ray highly suspicious for PCP pneumonia.  Code sepsis initiated and patient started on empiric antibiotics including Bactrim and cefepime.  This should cover PCP pneumonia and hospital-acquired pneumonia.  IV access was difficult.  IV team unable to place midline.  This delayed antibiotic administration.  Central line was placed by myself.   Labs notable for worsening leukocytosis.  Hemoglobin slightly below discharge level.  She has thrombocytopenia which appears to be trending down prior to admission.  Patient also has AKI.  ABG notable for  compensated metabolic acidosis.   Patient's work of breathing continued to worsen.  Placed on BiPAP.  COVID negative.  Will require admission to stepdown or ICU.        Final Clinical Impression(s) / ED Diagnoses Final diagnoses:  Encounter for central line placement  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, unspecified part of lung ([place])  Acute respiratory failure with hypoxia ([facility])  Compensated metabolic acidosis  Anemia, unspecified type  Thrombocytopenia ([place])  AIDS (acquired immune deficiency syndrome) ([place])      This chart was dictated using voice recognition software.  Despite best efforts to proofread,  errors can occur which can change the documentation meaning.   Fatima Blank, MD 10/06/18 0830

## 2018-10-06 NOTE — Progress Notes (Signed)
  Echocardiogram 2D Echocardiogram has been performed.  Krystal Short 10/06/2018, 12:08 PM

## 2018-10-06 NOTE — Progress Notes (Signed)
RT took the BIPAP off the Pt and placed her on 4L  to see if the Pt would be able to breath without being SOB. The Pt began to belly breath and wasn't able to talk without being SOB. RT placed the BIPAP back on the Pt. The Pt stated that she wasn't ready. I explained to the Pt that Rt would try again later if she wanted to. RT will continue to monitor.

## 2018-10-06 NOTE — Discharge Summary (Signed)
[facility] Discharge Summary   Patient: Krystal Short WUJ:[id number]   PCP: Lajean Manes, MD DOB: [birth year]   Date of admission: 10/01/2018   Date of discharge: 10/04/2018     Discharge Diagnoses:  Principal Problem:   Symptomatic anemia Active Problems:   AIDS (acquired immune deficiency syndrome) (HCC)   Pneumonia   Thrombocytopenia (HCC)   Leukocytosis   Hypotension   Admitted From: home Disposition:  Home   Recommendations for Outpatient Follow-up:  1. Please follow up with PCP and ID as recommended. Also maintain follow up with Hem onc as scheduled   Follow-up Information    Stoneking, Hal, MD. Schedule an appointment as soon as possible for a visit in 1 week(s).   Specialty: Internal Medicine Contact information: [address] [place] [address] [phone]        [facility]             . Schedule an appointment as soon as possible for a visit in 1 week(s).   Contact information: [address] [place] [address]         Diet recommendation: cardiac diet  Activity: The patient is advised to gradually reintroduce usual activities,as tolerated .  Discharge Condition: good  Code Status: full code  History of present illness: As per the H and P dictated on admission, "Krystal Short is a 33 y.o. female with medical history significant for advanced HIV (CD4 58 09/09/2018, viral RNA <20 08/15/2018 on Biktarvy, azithromycin, and atovaquone), normocytic anemia, thrombocytopenia, and recent admission for sepsis due to suspected pneumocystis jirovecii pneumonia who presents to the ED from the cancer center for evaluation of symptomatic anemia, hypotension, and shortness of breath.  Patient reports on and off symptoms of lightheadedness, generalized weakness, shortness of breath, nonproductive cough over the last 3-4 days.  She reports a single episode of fever with diaphoresis.  She  denies any loss of consciousness.  She denies any chest pain, nausea, vomiting, abdominal pain, diarrhea, or dysuria.  She denies any obvious bleeding including epistaxis, hemoptysis, hematemesis, hematochezia, melena, hematuria.  She denies any menstrual bleeding as she is on birth control.  She went to the cancer center for routine Nplate injection and transfusion of red blood cells.  She was noted to be hypotensive with SBP in the 60s and they were unable to place a peripheral IV.  Hemoglobin was 6.0 with platelets 39,000.  New leukocytosis noted at 16.1.  She was therefore sent to the ED for possible need of central line and blood transfusion.  Patient has multiple recent admissions:  Admitted 07/04/2018-07/16/2018 with atypical infection with Bordetella bronchus septic versus sarcoid.  Underwent bronchoscopy 07/12/2018, biopsy "scant lung parenchyma with fibrosis and reactive changes.  No granulomata identified.".  Required transfusion 4 units PRBCs, 3 units platelets.  Admitted from 08/01/2018-08/22/2018 which time she was treated for sepsis from suspected pneumocystis pneumonia.  She was treated with steroids, clindamycin, and primaquine.  Hospitalization was complicated by anemia and thrombocytopenia requiring transfusion of 8 units PRBCs and 7 units platelets.  COVID-19 swab was negative, respiratory viral panel was positive for coronavirus 229 E.    Admitted 09/03/2018-09/13/2018 for generalized weakness and deconditioning in setting of anemia and thrombocytopenia.  Required transfusion 3 units PRBCs, 8 units platelets.  Completed IVIG x2.  Continued on prednisone taper."  Hospital Course:  Summary of her active problems in the hospital is as following. Symptomatic anemia with thrombocytopenia: Bone biopsy 09/11/2018  showed hypercellular bone marrow for age with trilineage hematopoiesis, peripheral blood normocytic normochromic anemia, neutrophilic left shift, thrombocytopenia.Recent admissions  requiring multiple transfusions of PRBCs and platelets. Also received IVIG on past admission. -Hemoglobin 6.0 at [facility], order to transfuse 2 units PRBCs -Platelets 39,000, received Nplate injection day of admission 10/01/2018 -Patient denies any obvious bleeding -Discussed with hematology.  No further treatment or work-up at present. Patient actually received a platelet transfusion without any rise in her platelet count. Patient is on a very gradual taper of prednisone.  Bilateral airspace opacities, question atypical pneumonia: Septic shock requiring pressors. Chest x-ray changes appearprogressedfrom prior admissions. Has been treated for suspected pneumocystis jiroveciipneumoniaon prior admission with primaquine andclindamycin. -Treated with IV ceftriaxone and doxycycline. -Supplemental oxygen as needed -No growth on blood cultures -SARS-CoV-2 test negative -Prior blood, fungal, bone marrow cultures are negative. -Prior bone marrow acid-fast smear is negative. -Negative respiratory viral panel 09/03/2018,positive coronavirus 229E on respiratory viral panel 08/02/2018 -SARS-CoV-2 test negative 09/03/2018, 08/01/2018, and 07/30/2018 -bronchoscopy 6/5/2020showedscant lung parenchyma with fibrosis and reactive changes. No granulomataidentified.Few Bordetella bronchiseptica were grown onculture. -Legionella and strep pneumo negative 09/03/2018 -Required pressors on 8/25 night.  Currently off of pressors. -Discussed with ID prior to discharge.  Recommend completing 7-day treatment course with the antibiotic given that the patient actually clinically appears improving.  Although suspect that she actually responded to steroids more than antibiotics.  Advanced HIV: CD4 58 09/09/2018, viral RNA<207/10/2018. Patient reports adherence to home medications. -Continue Biktarvy, atovaquone, andweeklyazithromycin  Hypotension: Improving with IV fluids Hold IV fluids  now.  Acute kidney injury: Likely prerenal from anemia and hypotension.  Resolved Monitor renal function  Generalized weakness: Suspect secondary to deconditioning and anemia. Continue fluids, blood transfusion as above. PT eval.  Patient was ambulatory without any assistance. On the day of the discharge the patient's vitals were stable, and no other acute medical condition were reported by patient. the patient was felt safe to be discharge at Home with Home health.  Consultants: Phone discussion with ID and heme-onc. Procedures: none  DISCHARGE MEDICATION: Allergies as of 10/04/2018      Reactions   Heparin Other (See Comments)   Unknown reaction      Medication List    STOP taking these medications   metoprolol tartrate 25 MG tablet Commonly known as: LOPRESSOR     TAKE these medications   atovaquone 750 MG/5ML suspension Commonly known as: MEPRON Take 10 mLs (1,500 mg total) by mouth daily.   azithromycin 600 MG tablet Commonly known as: ZITHROMAX Take 2 tablets (1,200 mg total) by mouth once a week. What changed: when to take this   benzonatate 100 MG capsule Commonly known as: TESSALON Take 1 capsule (100 mg total) by mouth 3 (three) times daily.   Biktarvy 50-200-25 MG Tabs tablet Generic drug: bictegravir-emtricitabine-tenofovir AF Take 1 tablet by mouth daily.   cefdinir 300 MG capsule Commonly known as: OMNICEF Take 1 capsule (300 mg total) by mouth 2 (two) times daily for 3 days.   dextromethorphan-guaiFENesin 30-600 MG 12hr tablet Commonly known as: MUCINEX DM Take 1 tablet by mouth 2 (two) times daily.   doxycycline 100 MG tablet Commonly known as: VIBRA-TABS Take 1 tablet (100 mg total) by mouth 2 (two) times daily for 3 days.   predniSONE 10 MG tablet Commonly known as: DELTASONE Take 8m daily for 7 days,Take 328mdaily for 7 days,Take 2034maily for 7 days,Take 70m55mily for 7 days, Take 70mg35mry other day for 7  days  then stop What  changed: additional instructions      Allergies  Allergen Reactions   Heparin Other (See Comments)    Unknown reaction   Discharge Instructions    Diet - low sodium heart healthy   Complete by: As directed    Increase activity slowly   Complete by: As directed      Discharge Exam: Filed Weights   10/02/18 0121 10/03/18 0500 10/04/18 1638  Weight: 51.7 kg 54.5 kg 49.3 kg   Vitals:   10/04/18 1359 10/04/18 1650  BP:  (!) 126/91  Pulse:  (!) 116  Resp:    Temp:  (!) 97.3 F (36.3 C)  SpO2: 96% 96%   General: Appear in mild distress, no Rash; Oral Mucosa Clear, moist. no Abnormal Mass Or lumps Cardiovascular: S1 and S2 Present, no Murmur, Respiratory: normal respiratory effort, Bilateral Air entry present and bilateral  Crackles, no wheezes Abdomen: Bowel Sound present, Soft and no tenderness, no hernia Extremities: no Pedal edema, no calf tenderness Neurology: alert and oriented to time, place, and person affect appropriate. normal without focal findings, mental status, speech normal, alert and oriented x3, PERLA, Motor strength 5/5 and symmetric and sensation grossly normal to light touch   The results of significant diagnostics from this hospitalization (including imaging, microbiology, ancillary and laboratory) are listed below for reference.    Significant Diagnostic Studies: Dg Chest 2 View  Result Date: 09/19/2018 CLINICAL DATA:  Hypotensive, recent pneumonia EXAM: CHEST - 2 VIEW COMPARISON:  Chest radiograph 08/26/2018 FINDINGS: Heart size within normal limits. Ill-defined and subtly nodular pulmonary opacities are again demonstrated, more prominent within the right lung. Findings are similar to prior chest radiograph 09/03/2018 and suspicious for infection. No evidence of pneumothorax or pleural effusion. No acute bony abnormality. IMPRESSION: Ill-defined and subtly nodular pulmonary opacities are similar to prior exam 08/26/2018 and suspicious for infection,  suspected atypical pneumonia. Electronically Signed   By: Kellie Simmering   On: 09/19/2018 13:33   Ct Biopsy  Result Date: 09/11/2018 CLINICAL DATA:  HIV positive. Elevated white blood cell count, anemia, thrombocytopenia EXAM: CT GUIDED DEEP ILIAC BONE ASPIRATION AND CORE BIOPSY TECHNIQUE: Patient was placed prone on the CT gantry and limited axial scans through the pelvis were obtained. Appropriate skin entry site was identified. Skin site was marked, prepped with chlorhexidine, draped in usual sterile fashion, and infiltrated locally with 1% lidocaine. Intravenous Fentanyl 164mg and Versed 272mwere administered as conscious sedation during continuous monitoring of the patient's level of consciousness and physiological / cardiorespiratory status by the radiology RN, with a total moderate sedation time of 12 minutes. Under CT fluoroscopic guidance an 11-gauge Cook trocar bone needle was advanced into the right iliac bone just lateral to the sacroiliac joint. Once needle tip position was confirmed, core and aspiration samples were obtained, submitted to pathology for approval. A second core was obtained for cultures as requested. Patient tolerated procedure well. COMPLICATIONS: COMPLICATIONS none IMPRESSION: 1. Technically successful CT guided right iliac bone core and aspiration biopsy. Electronically Signed   By: D Lucrezia Europe.D.   On: 09/11/2018 12:40   Dg Chest Port 1 View  Result Date: 10/02/2018 CLINICAL DATA:  Tachypnea EXAM: PORTABLE CHEST 1 VIEW COMPARISON:  Yesterday FINDINGS: Generalized reticular pulmonary opacity, present on multiple prior studies. No pleural fluid or pneumothorax. Normal heart size. IMPRESSION: Chronic reticular pulmonary opacity without acute superimposed finding. Electronically Signed   By: JoMonte Fantasia.D.   On: 10/02/2018 05:45  Dg Chest Port 1 View  Result Date: 10/01/2018 CLINICAL DATA:  Cough, pancytopenia EXAM: PORTABLE CHEST 1 VIEW COMPARISON:  09/19/2018  FINDINGS: Stable cardiomediastinal contours. Extensive, diffuse reticulonodular opacities throughout both lungs, markedly progressed from prior. No pleural effusion. No pneumothorax. Osseous structures intact. IMPRESSION: Extensive reticulonodular opacities throughout both lungs suggestive of an atypical infection in an immunocompromised patient. Electronically Signed   By: Davina Poke M.D.   On: 10/01/2018 18:38   Ct Bone Marrow Biopsy & Aspiration  Result Date: 09/11/2018 CLINICAL DATA:  HIV positive. Elevated white blood cell count, anemia, thrombocytopenia EXAM: CT GUIDED DEEP ILIAC BONE ASPIRATION AND CORE BIOPSY TECHNIQUE: Patient was placed prone on the CT gantry and limited axial scans through the pelvis were obtained. Appropriate skin entry site was identified. Skin site was marked, prepped with chlorhexidine, draped in usual sterile fashion, and infiltrated locally with 1% lidocaine. Intravenous Fentanyl 163mg and Versed 211mwere administered as conscious sedation during continuous monitoring of the patient's level of consciousness and physiological / cardiorespiratory status by the radiology RN, with a total moderate sedation time of 12 minutes. Under CT fluoroscopic guidance an 11-gauge Cook trocar bone needle was advanced into the right iliac bone just lateral to the sacroiliac joint. Once needle tip position was confirmed, core and aspiration samples were obtained, submitted to pathology for approval. A second core was obtained for cultures as requested. Patient tolerated procedure well. COMPLICATIONS: COMPLICATIONS none IMPRESSION: 1. Technically successful CT guided right iliac bone core and aspiration biopsy. Electronically Signed   By: D Lucrezia Europe.D.   On: 09/11/2018 12:40    Microbiology: Recent Results (from the past 240 hour(s))  Blood Culture (routine x 2)     Status: None (Preliminary result)   Collection Time: 10/01/18  6:32 PM   Specimen: BLOOD RIGHT ARM  Result Value Ref  Range Status   Specimen Description   Final    BLOOD RIGHT ARM Performed at [facility] [place] [address]  Special Requests   Final    BOTTLES DRAWN AEROBIC AND ANAEROBIC Blood Culture adequate volume Performed at [address] [place] [address]  Culture   Final    NO GROWTH 4 DAYS Performed at [facility] [place] [address]  Report Status PENDING  Incomplete  Urine culture     Status: Abnormal   Collection Time: 10/01/18  6:32 PM   Specimen: In/Out Cath Urine  Result Value Ref Range Status   Specimen Description   Final    IN/OUT CATH URINE Performed at [address] [place] [address]  Special Requests   Final    NONE Performed at [facility] [place] [address]  Culture (A)  Final    <10,000 COLONIES/mL INSIGNIFICANT GROWTH Performed at [address] [place] [id number]  Report Status 10/02/2018 FINAL  Final  Novel Coronavirus, NAA (Hosp order, Send-out to Ref Lab; TAT 18-24 hrs     Status: None   Collection Time: 10/01/18  6:55 PM  Result Value Ref Range Status   SARS-CoV-2, NAA NOT DETECTED NOT DETECTED Final    Comment: (NOTE) This test was developed and its performance characteristics determined by LaB[employer]on, Dickinson and CompanyThis test has not been FDA cleared or approved. This test has  been authorized by FDA under an Emergency Use Authorization (EUA). This test is only authorized for the duration of time the declaration that circumstances exist justifying the authorization of the emergency use of in vitro diagnostic tests for detection of SARS-CoV-2 virus and/or diagnosis of COVID-19 infection under section 564(b)(1) of the Act, 21 U.S.C. 258NID-7(O)(2), unless the authorization is terminated or revoked sooner. When  diagnostic testing is negative, the possibility of a false negative result should be considered in the context of a patient's recent exposures and the presence of clinical signs and symptoms consistent with COVID-19. An individual without symptoms of COVID-19 and who is not shedding SARS-CoV-2 virus would expect to have a negative (not detected) result in this assay. Performed  At: [facility] [place], [place] [id number] Rush Farmer MD RX:[id number]    [place]  Final    Comment: Performed at [place] [address] [place], [place] [address]  MRSA PCR Screening     Status: None   Collection Time: 10/01/18 10:44 PM   Specimen: Nasal Mucosa; Nasopharyngeal  Result Value Ref Range Status   MRSA by PCR NEGATIVE NEGATIVE Final    Comment:        The GeneXpert MRSA Assay (FDA approved for NASAL specimens only), is one component of a comprehensive MRSA colonization surveillance program. It is not intended to diagnose MRSA infection nor to guide or monitor treatment for MRSA infections. Performed at [facility], [place] [address] [place], [place] [address]   Blood Culture (routine x 2)     Status: None (Preliminary result)   Collection Time: 10/02/18 11:00 AM   Specimen: BLOOD RIGHT HAND  Result Value Ref Range Status   Specimen Description   Final    BLOOD RIGHT HAND Performed at [place] [address] [place], [place] [address]    Special Requests   Final    BOTTLES DRAWN AEROBIC AND ANAEROBIC Blood Culture adequate volume Performed at [facility] [address] [place], [place] [address]    Culture   Final    NO GROWTH 3 DAYS Performed at [facility], [place] [address], [place], [place] [address]    Report Status PENDING  Incomplete   Labs: CBC: Recent Labs  Lab 10/01/18 1219 10/02/18 0653 10/03/18 0210 10/04/18 0514    WBC 16.1* 17.9* 24.2* 20.4*  NEUTROABS 13.0 14.7* 16.0* 12.5*  HGB 6.0* 10.8*   10.6* 10.1* 8.5*  HCT 18.8* 35.5*   33.5* 31.8* 26.0*  MCV 93.5 98.9 92.4 92.5  PLT 39* 30* 14* 13*   Basic Metabolic Panel: Recent Labs  Lab 10/01/18 1832 10/02/18 0653 10/03/18 0210 10/04/18 0514  NA 130* 135 133* 134*  K 4.0 4.0 4.4 4.1  CL 105 112* 109 110  CO2 16* 16* 11* 16*  GLUCOSE 130* 101* 143* 111*  BUN 34* 26* 35* 43*  CREATININE 1.45* 1.09* 1.17* 1.05*  CALCIUM 7.2* 7.5* 7.4* 7.5*  MG  --   --  1.4* 2.3  Lady Liver Function Tests: Lab 10/01/18 1832 10/03/18 0210 10/04/18 0514  AST _0 ALT _1 ALKPHOS 189* 146* 125  BILITOT 1.9* 2.3* 1.6*  PROT 6.4* 6.0* 6.2*  ALBUMIN 1.9* 1.7* 1.7*   No results for input(s): LIPASE, AMYLASE in the last 168 hours. No results for input(s): AMMONIA in the last 168 hours. Cardiac Enzymes: No results for input(s): CKTOTAL, CKMB, CKMBINDEX,  TROPONINI in the last 168 hours. BNP (last 3 results) Recent Labs    03/08/18 2129 08/02/18 1520 10/02/18 1112  BNP 8.8 144.0* 279.0*   CBG: No results for input(s): GLUCAP in the last 168 hours. Time spent: 35 minutes  Signed:  Berle Mull  [facility] 10/04/2018

## 2018-10-06 NOTE — Progress Notes (Signed)
Pharmacy Antibiotic Note  Krystal Short is a 33 y.o. female admitted on 10/05/2018 with pneumonia.  Pharmacy has been consulted for Cefepime, Bactrim dosing.  Plan: Cefepime 2gm iv q12hr Bactrim 260mg  iv q8hr (15mg /kg/day dosing)  Height: 5\' 1"  (154.9 cm) Weight: 113 lb (51.3 kg) IBW/kg (Calculated) : 47.8  Temp (24hrs), Avg:98.2 F (36.8 C), Min:97.9 F (36.6 C), Max:98.4 F (36.9 C)  Recent Labs  Lab 10/01/18 1219  10/01/18 1832 10/02/18 0653 10/03/18 0210 10/03/18 0556 10/04/18 0514 10/05/18 0120 10/05/18 2313 10/06/18 0113 10/06/18 0136 10/06/18 0300  WBC 16.1*  --   --  17.9* 24.2*  --  20.4*  --   --   --  20.9*  --   CREATININE  --    < > 1.45* 1.09* 1.17*  --  1.05* 1.57*  --   --   --  1.50*  LATICACIDVEN  --   --  1.4  --  3.2* 3.1*  --   --  2.6* 1.2  --   --    < > = values in this interval not displayed.    Estimated Creatinine Clearance: 41 mL/min (A) (by C-G formula based on SCr of 1.5 mg/dL (H)).    Allergies  Allergen Reactions  . Heparin Other (See Comments)    Unknown reaction    Antimicrobials this admission: Bactrim 10/06/2018 >> Cefepime 10/06/2018 >>   Dose adjustments this admission: -  Microbiology results: -  Thank you for allowing pharmacy to be a part of this patient's care.  Krystal Short 10/06/2018 6:33 AM

## 2018-10-06 NOTE — Progress Notes (Addendum)
[facility] Progress Note  Patient: Krystal Short [id number]   PCP: Lajean Manes, MD DOB: Apr 12, 1985   DOA: 10/05/2018   DOS: 10/06/2018   Date of Service: the patient was seen and examined on 10/06/2018  Brief hospital course: Pt. with PMH of advanced HIV (CD4 58 09/09/2018, viral RNA <20 08/15/2018 on Biktarvy, azithromycin, and atovaquone), normocytic anemia, thrombocytopenia, and recent admission for sepsis due to suspected pneumocystis jirovecii pneumonia; admitted on 10/05/2018, presented with complaint of cough and shortness of breath, was found to have acute pneumonia. Recent admission. > 06/14/2018-07/16/2018 Bordetella bronchus infection.  Underwent bronc 07/12/2018.  Biopsy scant lung parenchyma with fibrosis and reactive changes. Required 4 PRBC and 3 platelet infusion. >07/31/2026-08/22/2018 sepsis from suspected PJP pneumonia.  Treated with steroids clindamycin and primaquine. Required 8 PRBC and 7 platelet transfusion.  RVP positive for routine coronavirus. > 09/03/2018-09/13/2018 generalized weakness and deconditioning.  Required 3 PRBC 8 platelet transfusion. IVIG on 09/05/2018 without any improvement in platelet count.  Given prolonged prednisone taper. > 10/01/2018-10/04/2018 presented with anemia and poor IV access.  Given 2 PRBC 1 platelet transfusion.  Required pressors.  Treated with ceftriaxone and doxy for CAP.  Currently further plan is further work-up for dyspnea, monitor recommendation from pulmonary, ID, heme-onc.  Subjective: Continues to have severe shortness of breath reports severe anxiety.  On BiPAP.  Assessment and Plan: Acute respiratory distress. Acute hypoxic respiratory failure. Presents to the hospital with shortness of breath. ABG performed showed normal pH, very low PCO2 concerning for hypoventilation.  PO2 of 132 on 4 L of nasal cannula. Currently on BiPAP due to increased work of breathing. Lactic acid is normal this time. Chest x-ray shows no acute  abnormality. Prior blood, fungal, bone marrow cultures are negative.   Prior bone marrow acid-fast smear is negative.   Negative respiratory viral panel 09/03/2018, positive coronavirus 229E on respiratory viral panel 08/02/2018 SARS-CoV-2 test negative 10/01/2018, 10/05/2018, 09/03/2018, 08/01/2018, and 07/30/2018 bronchoscopy 07/12/2018 showed scant lung parenchyma with fibrosis and reactive changes.  No granulomata identified. Few Bordetella bronchiseptica were grown on culture. Legionella and strep pneumo negative 09/03/2018  -Will probably require a PE study/noncontrast CT scan. -monitor recommendation from pulmonary. -Currently admitted to stepdown unit. -Continue steroids continue nebulization. -Limited echocardiogram.  Symptomatic anemia with severe thrombocytopenia: Bone biopsy 09/11/2018 showed hypercellular bone marrow for age with trilineage hematopoiesis, peripheral blood normocytic normochromic anemia, neutrophilic left shift, thrombocytopenia.  Recent admissions requiring multiple transfusions of PRBCs and platelets.  Also received IVIG on past admission. Etiology not clear. But primarily this is the limiting factor in getting the biopsy done for her lung issue.  -monitor recommendation from hematology. -Patient is currently receiving platelet transfusion. -recheck the levels after the transfusion. -Currently on IV Solu-Medrol 80 mg twice daily. -Avoid medication that can cause further pancytopenia.  Advanced HIV: CD4 58 09/09/2018, viral RNA <20 08/15/2018.  Patient reports adherence to home medications. -Continue Biktarvy, atovaquone, and weekly azithromycin -ID consulted for further input.  Acute kidney injury: Likely prerenal from anemia and hypotension. -Avoid nephrotoxic medications.  Generalized weakness: Suspect secondary to deconditioning and anemia.  Continue fluids, blood transfusion as above.  PT eval.  Anxiety. -PRN morphine. -Patient is clearly hyperventilating   Poor IV access. Central line placed by ED on 10/06/2018 morning. Will reevaluate necessity of the line.  Diet: Regular diet DVT Prophylaxis: SCD, pharmacological prophylaxis contraindicated due to Thrombocytopenia and anemia  Advance goals of care discussion: Full code  Family Communication: no family was present at bedside, at  the time of interview.   Disposition:  Discharge to Home  Consultants: Pulmonary, infectious disease, hematology Procedures: BiPAP Echocardiogram pending  Scheduled Meds: . sodium chloride   Intravenous Once  . atovaquone  1,500 mg Oral Daily  . [START ON 10/08/2018] azithromycin  1,200 mg Oral Q Tue  . benzonatate  100 mg Oral TID  . bictegravir-emtricitabine-tenofovir AF  1 tablet Oral Daily  . chlorhexidine  15 mL Mouth Rinse BID  . Chlorhexidine Gluconate Cloth  6 each Topical Daily  . dextromethorphan-guaiFENesin  1 tablet Oral BID  . mouth rinse  15 mL Mouth Rinse q12n4p  . methylPREDNISolone (SOLU-MEDROL) injection  80 mg Intravenous Q12H  . mometasone-formoterol  2 puff Inhalation BID   Continuous Infusions: . ceFEPime (MAXIPIME) IV     PRN Meds: albuterol, diazepam, morphine injection Antibiotics: Anti-infectives (From admission, onward)   Start     Dose/Rate Route Frequency Ordered Stop   10/08/18 1000  azithromycin (ZITHROMAX) tablet 1,200 mg     1,200 mg Oral Every Tue 10/06/18 0520     10/06/18 1800  ceFEPIme (MAXIPIME) 2 g in sodium chloride 0.9 % 100 mL IVPB     2 g 200 mL/hr over 30 Minutes Intravenous Every 12 hours 10/06/18 0632     10/06/18 1000  atovaquone (MEPRON) 750 MG/5ML suspension 1,500 mg     1,500 mg Oral Daily 10/06/18 0520     10/06/18 1000  bictegravir-emtricitabine-tenofovir AF (BIKTARVY) 50-200-25 MG per tablet 1 tablet     1 tablet Oral Daily 10/06/18 0520     10/06/18 0045  ceFEPIme (MAXIPIME) 2 g in sodium chloride 0.9 % 100 mL IVPB  Status:  Discontinued     2 g 200 mL/hr over 30 Minutes Intravenous Every 8  hours 10/06/18 0032 10/06/18 0632   10/06/18 0030  sulfamethoxazole-trimethoprim (BACTRIM) 260 mg of trimethoprim in dextrose 5 % 250 mL IVPB  Status:  Discontinued     260 mg of trimethoprim 266.3 mL/hr over 60 Minutes Intravenous Every 8 hours 10/06/18 0029 10/06/18 0801       Objective: Physical Exam: Vitals:   10/06/18 0645 10/06/18 0806 10/06/18 0814 10/06/18 0818  BP: (!) 109/57   (!) 119/93  Pulse: (!) 123 (!) 132  (!) 133  Resp: (!) 33 (!) 36  (!) 36  Temp:   (!) 97.4 F (36.3 C) (!) 97.4 F (36.3 C)  TempSrc:   Axillary Axillary  SpO2: 100% 100%    Weight:      Height:        Intake/Output Summary (Last 24 hours) at 10/06/2018 0859 Last data filed at 10/06/2018 0818 Gross per 24 hour  Intake 706 ml  Output -  Net 706 ml   Filed Weights   10/05/18 2256  Weight: 51.3 kg   General: alert and oriented to time, place, and person. Appear in severe distress, affect appropriate Eyes: PERRL, Conjunctiva normal ENT: Oral Mucosa Clear, moist  Neck: difficult to assess  JVD, no Abnormal Mass Or lumps Cardiovascular: S1 and S2 Present, no Murmur, peripheral pulses symmetrical Respiratory: increased  respiratory effort, Bilateral Air entry equal and Decreased, no use of accessory muscle, bilateral Crackles, no wheezes Abdomen: Bowel Sound present, Soft and no tenderness, no hernia Skin: no rashes diffuse skin papules. Extremities: no Pedal edema, no calf tenderness Neurologic: normal without focal findings, mental status, speech normal, alert and oriented x3, PERLA, Motor strength 5/5 and symmetric and sensation grossly normal to light touch Gait not checked due  to patient safety concerns  Data Reviewed: CBC: Recent Labs  Lab 10/01/18 1219 10/02/18 0653 10/03/18 0210 10/04/18 0514 10/06/18 0136 10/06/18 0300  WBC 16.1* 17.9* 24.2* 20.4* 20.9*  --   NEUTROABS 13.0 14.7* 16.0* 12.5*  --   --   HGB 6.0* 10.8*  10.6* 10.1* 8.5* 7.9* 7.8*  HCT 18.8* 35.5*  33.5* 31.8*  26.0* 24.6* 23.0*  MCV 93.5 98.9 92.4 92.5 93.9  --   PLT 39* 30* 14* 13* 9*  --    Basic Metabolic Panel: Recent Labs  Lab 10/01/18 1832 10/02/18 0653 10/03/18 0210 10/04/18 0514 10/05/18 0120 10/06/18 0300  NA 130* 135 133* 134* 137 138  K 4.0 4.0 4.4 4.1 4.3 4.2  CL 105 112* 109 110 112* 112*  CO2 16* 16* 11* 16* 16*  --   GLUCOSE 130* 101* 143* 111* 134* 130*  BUN 34* 26* 35* 43* 62* 54*  CREATININE 1.45* 1.09* 1.17* 1.05* 1.57* 1.50*  CALCIUM 7.2* 7.5* 7.4* 7.5* 7.6*  --   MG  --   --  1.4* 2.3  --   --     Liver Function Tests: Recent Labs  Lab 10/01/18 1832 10/03/18 0210 10/04/18 0514 10/05/18 0120  AST 21 31 18 28   ALT 10 10 10 11   ALKPHOS 189* 146* 125 191*  BILITOT 1.9* 2.3* 1.6* 3.1*  PROT 6.4* 6.0* 6.2* 6.0*  ALBUMIN 1.9* 1.7* 1.7* 1.6*   No results for input(s): LIPASE, AMYLASE in the last 168 hours. No results for input(s): AMMONIA in the last 168 hours. Coagulation Profile: Recent Labs  Lab 10/01/18 1832 10/05/18 0120  INR 1.3* 1.4*   Cardiac Enzymes: No results for input(s): CKTOTAL, CKMB, CKMBINDEX, TROPONINI in the last 168 hours. BNP (last 3 results) No results for input(s): PROBNP in the last 8760 hours. CBG: No results for input(s): GLUCAP in the last 168 hours. Studies: Dg Chest 2 View  Result Date: 10/05/2018 CLINICAL DATA:  Suspected sepsis EXAM: CHEST - 2 VIEW COMPARISON:  10/02/2018, 10/01/2018, 09/19/2018, 05/28/2018, 06/03/2017, 03/11/2013, 10/15/2017, 07/12/2018 FINDINGS: Small right-sided pleural effusion. Underlying reticular interstitial changes suggesting component of chronic disease, however there is interim worsening of bilateral right greater than left airspace disease, suspicious for acute edema or pneumonia superimposed on chronic underlying disease. Stable cardiomediastinal silhouette. No pneumothorax. IMPRESSION: 1. Interval worsening of bilateral right greater than left airspace disease, suspicious for acute edema or  pneumonia superimposed on underlying chronic disease. Small right-sided pleural effusion. Electronically Signed   By: Donavan Foil M.D.   On: 10/05/2018 23:52   Dg Chest Port 1 View  Result Date: 10/06/2018 CLINICAL DATA:  33 year old female with centralized placement. EXAM: PORTABLE CHEST 1 VIEW COMPARISON:  Chest radiograph dated 10/05/2018 FINDINGS: Right IJ central venous line with tip over the cavoatrial junction. No pneumothorax. Bilateral airspace opacities similar to prior radiograph. There is a small right pleural effusion. No acute osseous pathology. IMPRESSION: 1. Interval placement of a right IJ central venous line with tip in the region of the cavoatrial junction. No pneumothorax. 2. No significant interval change in the bilateral airspace opacities. Electronically Signed   By: Anner Crete M.D.   On: 10/06/2018 04:04     Time spent: 35 minutes   Author: Berle Mull, MD [facility] 10/06/2018 8:59 AM  To reach On-call, see care teams to locate the attending and reach out to them via [email or web address]. If 7PM-7AM, please contact night-coverage If you still have difficulty reaching the attending provider, please page  the [facility] (Director on Call) for [facility] on amion for assistance.

## 2018-10-06 NOTE — ED Notes (Signed)
She is anxious. Medication given per order of Dr. Leonette Monarch. She remains on bi-pap.

## 2018-10-06 NOTE — Progress Notes (Addendum)
CRITICAL VALUE ALERT  Critical Value:  hemoglobin 6.8, platelet 17  Date & Time Notied:  12:30 PM 10/06/2018  Provider Notified: Marlowe Sax, MD  Orders Received/Actions taken: New orders received to transfuse PRBC.

## 2018-10-06 NOTE — ED Notes (Addendum)
Date and time results received: 10/06/18 0411  Test: Platelets Critical Value: 9  Name of Provider Notified: Dr. Leonette Monarch  Orders Received? Or Actions Taken?: see orders

## 2018-10-06 NOTE — ED Notes (Signed)
ED TO INPATIENT HANDOFF REPORT  Name/Age/Gender Krystal Short 33 y.o. female  Code Status    Code Status Orders  (From admission, onward)         Start     Ordered   10/06/18 0532  Full code  Continuous     10/06/18 0538        Code Status History    Date Active Date Inactive Code Status Order ID Comments User Context   10/01/2018 2113 10/04/2018 2121 Full Code [id number]  Lenore Cordia, MD ED   09/03/2018 2122 09/13/2018 2305 Full Code [id number]  Vianne Bulls, MD Inpatient   08/02/2018 0142 08/22/2018 2054 Full Code [id number]  [name], MD ED   07/04/2018 1709 07/16/2018 1809 Full Code [id number]  [name], MD Inpatient   05/28/2018 1841 05/31/2018 1915 Full Code [id number]  [name], MD Inpatient   03/08/2018 2046 03/10/2018 1736 Full Code [id number]  [name], MD ED   06/03/2017 1741 06/11/2017 2216 Full Code [id number]  [name], NP ED   Advance Care Planning Activity      Home/SNF/Other Home  Chief Complaint Short of breath  Level of Care/Admitting Diagnosis ED Disposition    ED Disposition Condition [facility] [facility] Area: [facility] [[id number]]  Level of Care: Stepdown [14]  Admit to SDU based on following criteria: Respiratory Distress:  Frequent assessment and/or intervention to maintain adequate ventilation/respiration, pulmonary toilet, and respiratory treatment.  Covid Evaluation: Confirmed COVID Negative  Diagnosis: PCP (pneumocystis jiroveci pneumonia) ([place]) [id number]  Admitting Physician: Neena Rhymes [[id number]]  Attending Physician: Adella Hare E [[id number]]  Estimated length of stay: 5 - 7 days  Certification:: I certify this patient will need inpatient services for at least 2 midnights  PT Class (Do Not Modify): Inpatient [101]  PT Acc Code (Do Not Modify): Private [1]       Medical History Past Medical History:  Diagnosis Date  . Acute hyponatremia 06/03/2017  . Anemia   . CAP (community  acquired pneumonia) 06/03/2017  . Facial dermatitis 06/03/2017  . HIV (human immunodeficiency virus infection) ([place])    diagnosed in April 2019  . Pleurisy 06/03/2017  . 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 ([place])   . Thrush of mouth and esophagus ([place])   . UTI (urinary tract infection)     Allergies Allergies  Allergen Reactions  . Heparin Other (See Comments)    Unknown reaction    IV Location/Drains/Wounds Patient Lines/Drains/Airways Status   Active Line/Drains/Airways    Name:   Placement date:   Placement time:   Site:   Days:   CVC Triple Lumen 10/06/18 Right Other (Comment)   10/06/18    0333     less than 1          Labs/Imaging Results for orders placed or performed during the hospital encounter of 10/05/18 (from the past 48 hour(s))  Comprehensive metabolic panel     Status: Abnormal   Collection Time: 10/05/18  1:20 AM  Result Value Ref Range   Sodium 137 135 - 145 mmol/L   Potassium 4.3 3.5 - 5.1 mmol/L   Chloride 112 (H) 98 - 111 mmol/L   CO2 16 (L) 22 - 32 mmol/L   Glucose, Bld 134 (H) 70 - 99 mg/dL   BUN 62 (H) 6 - 20 mg/dL   Creatinine, Ser 1.57 (H) 0.44 - 1.00 mg/dL   Calcium 7.6 (L) 8.9 -  10.3 mg/dL   Total Protein 6.0 (L) 6.5 - 8.1 g/dL   Albumin 1.6 (L) 3.5 - 5.0 g/dL   AST 28 15 - 41 U/L   ALT 11 0 - 44 U/L   Alkaline Phosphatase 191 (H) 38 - 126 U/L   Total Bilirubin 3.1 (H) 0.3 - 1.2 mg/dL   GFR calc non Af Amer 43 (L) >60 mL/min   GFR calc Af Amer 50 (L) >60 mL/min   Anion gap 9 5 - 15    Comment: Performed at [facility], [place] [address], [place], [place] [address]  Protime-INR     Status: Abnormal   Collection Time: 10/05/18  1:20 AM  Result Value Ref Range   Prothrombin Time 17.2 (H) 11.4 - 15.2 seconds   INR 1.4 (H) 0.8 - 1.2    Comment: (NOTE) INR goal varies based on device and disease states. Performed at [facility], [place] [address] [place], [place] [address]   Lactic acid,  plasma     Status: Abnormal   Collection Time: 10/05/18 11:13 PM  Result Value Ref Range   Lactic Acid, Venous 2.6 (HH) 0.5 - 1.9 mmol/L    Comment: HEMOLYSIS AT THIS LEVEL MAY AFFECT RESULT CRITICAL RESULT CALLED TO, READ BACK BY AND VERIFIED WITH: G,GARRISON AT 0120 ON 10/06/18 BY A,MOHAMED Performed at [facility], [place] [address], [place], [place] [address]   SARS Coronavirus 2 [facility] order, Performed in [facility] hospital lab) Nasopharyngeal Nasopharyngeal Swab     Status: None   Collection Time: 10/05/18 11:22 PM   Specimen: Nasopharyngeal Swab  Result Value Ref Range   SARS Coronavirus 2 NEGATIVE NEGATIVE    Comment: (NOTE) If result is NEGATIVE SARS-CoV-2 target nucleic acids are NOT DETECTED. The SARS-CoV-2 RNA is generally detectable in upper and lower  respiratory specimens during the acute phase of infection. The lowest  concentration of SARS-CoV-2 viral copies this assay can detect is 250  copies / mL. A negative result does not preclude SARS-CoV-2 infection  and should not be used as the sole basis for treatment or other  patient management decisions.  A negative result may occur with  improper specimen collection / handling, submission of specimen other  than nasopharyngeal swab, presence of viral mutation(s) within the  areas targeted by this assay, and inadequate number of viral copies  (<250 copies / mL). A negative result must be combined with clinical  observations, patient history, and epidemiological information. If result is POSITIVE SARS-CoV-2 target nucleic acids are DETECTED. The SARS-CoV-2 RNA is generally detectable in upper and lower  respiratory specimens dur ing the acute phase of infection.  Positive  results are indicative of active infection with SARS-CoV-2.  Clinical  correlation with patient history and other diagnostic information is  necessary to determine patient infection status.  Positive results do  not rule out  bacterial infection or co-infection with other viruses. If result is PRESUMPTIVE POSTIVE SARS-CoV-2 nucleic acids MAY BE PRESENT.   A presumptive positive result was obtained on the submitted specimen  and confirmed on repeat testing.  While 2019 novel coronavirus  (SARS-CoV-2) nucleic acids may be present in the submitted sample  additional confirmatory testing may be necessary for epidemiological  and / or clinical management purposes  to differentiate between  SARS-CoV-2 and other Sarbecovirus currently known to infect humans.  If clinically indicated additional testing with an alternate test  methodology [id number]) is advised. The SARS-CoV-2 RNA is generally  detectable in upper and lower respiratory sp ecimens during the acute  phase of infection. The expected result is Negative. Fact Sheet for Patients:  StrictlyIdeas.no Fact Sheet for Healthcare Providers: BankingDealers.co.za This test is not yet approved or cleared by the [place] FDA and has been authorized for detection and/or diagnosis of SARS-CoV-2 by FDA under an Emergency Use Authorization (EUA).  This EUA will remain in effect (meaning this test can be used) for the duration of the COVID-19 declaration under Section 564(b)(1) of the Act, 21 U.S.C. section 360bbb-3(b)(1), unless the authorization is terminated or revoked sooner. Performed at [facility], [place] [address] [place], [place] [address]   Blood gas, arterial     Status: Abnormal   Collection Time: 10/05/18 11:50 PM  Result Value Ref Range   O2 Content 4.0 L/min   Delivery systems NASAL CANNULA    pH, Arterial 7.440 7.350 - 7.450   pCO2 arterial 19.6 (LL) 32.0 - 48.0 mmHg    Comment: CRITICAL RESULT CALLED TO, READ BACK BY AND VERIFIED WITH:  Corie Chiquito, RN AT [place] NEUGENT,RRT,RCP ON 10/06/2018    pO2, Arterial 132 (H) 83.0 - 108.0 mmHg   Bicarbonate 13.1 (L) 20.0 - 28.0  mmol/L   Acid-base deficit 9.6 (H) 0.0 - 2.0 mmol/L   O2 Saturation 98.4 %   Patient temperature 98.6    Collection site LEFT BRACHIAL    Drawn by [id number]    Sample type ARTERIAL DRAW     Comment: Performed at [place] [address] [place], [place] [address]  Lactic acid, plasma     Status: None   Collection Time: 10/06/18  1:13 AM  Result Value Ref Range   Lactic Acid, Venous 1.2 0.5 - 1.9 mmol/L    Comment: Performed at [facility], [place] [address], [place], [place] [address]  CBC     Status: Abnormal   Collection Time: 10/06/18  1:36 AM  Result Value Ref Range   WBC 20.9 (H) 4.0 - 10.5 K/uL   RBC 2.62 (L) 3.87 - 5.11 MIL/uL   Hemoglobin 7.9 (L) 12.0 - 15.0 g/dL   HCT 24.6 (L) 36.0 - 46.0 %   MCV 93.9 80.0 - 100.0 fL   MCH 30.2 26.0 - 34.0 pg   MCHC 32.1 30.0 - 36.0 g/dL   RDW 19.4 (H) 11.5 - 15.5 %   Platelets 9 (LL) 150 - 400 K/uL    Comment: REPEATED TO VERIFY Immature Platelet Fraction may be clinically indicated, consider ordering this additional test [id number] CONSISTENT WITH PREVIOUS RESULT THIS CRITICAL RESULT HAS VERIFIED AND BEEN CALLED TO DOSTER,T BY KENNEDY JACKSON ON 08 30 2020 AT 0350, AND HAS BEEN READ BACK. CRITICAL RESULT VERIFIED    nRBC 1.9 (H) 0.0 - 0.2 %    Comment: Performed at [facility], [place] [address] [place], [place] [address]  I-STAT, [place] Cl[place] 8     Status: Abnormal   Collection Time: 10/06/18  3:00 AM  Result Value Ref Range   Sodium 138 135 - 145 mmol/L   Potassium 4.2 3.5 - 5.1 mmol/L   Chloride 112 (H) 98 - 111 mmol/L   BUN 54 (H) 6 - 20 mg/dL   Creatinine, Ser 1.50 (H) 0.44 - 1.00 mg/dL   Glucose, Bld 130 (H) 70 - 99 mg/dL   Calcium, Ion 1.13 (L) 1.15 - 1.40 mmol/L   TCO2 15 (L) 22 - 32 mmol/L   Hemoglobin 7.8 (  L) 12.0 - 15.0 g/dL   HCT 23.0 (L) 36.0 - 46.0 %  I-Stat beta hCG blood, ED     Status: None   Collection Time: 10/06/18  3:00 AM  Result Value Ref Range   I-stat  hCG, quantitative <5.0 <5 mIU/mL   Comment 3            Comment:   GEST. AGE      CONC.  (mIU/mL)   <=1 WEEK        5 - 50     2 WEEKS       50 - 500     3 WEEKS       100 - 10,000     4 WEEKS     1,000 - 30,000        FEMALE AND NON-PREGNANT FEMALE:     LESS THAN 5 mIU/mL   Prepare Pheresed Platelets     Status: None (Preliminary result)   Collection Time: 10/06/18  5:37 AM  Result Value Ref Range   Unit Number [id number]    Blood Component Type PLTPHER LR2    Unit division 00    Status of Unit ISSUED    Transfusion Status      OK TO TRANSFUSE Performed at [place] [address] [place], [place] [address]   Type and screen [place]     Status: None   Collection Time: 10/06/18  6:08 AM  Result Value Ref Range   ABO/RH(D) O POS    Antibody Screen NEG    Sample Expiration      10/09/2018,2359 Performed at [facility], [place] [address], [place], [place] [address]    Dg Chest 2 View  Result Date: 10/05/2018 CLINICAL DATA:  Suspected sepsis EXAM: CHEST - 2 VIEW COMPARISON:  10/02/2018, 10/01/2018, 09/19/2018, 05/28/2018, 06/03/2017, 03/11/2013, 10/15/2017, 07/12/2018 FINDINGS: Small right-sided pleural effusion. Underlying reticular interstitial changes suggesting component of chronic disease, however there is interim worsening of bilateral right greater than left airspace disease, suspicious for acute edema or pneumonia superimposed on chronic underlying disease. Stable cardiomediastinal silhouette. No pneumothorax. IMPRESSION: 1. Interval worsening of bilateral right greater than left airspace disease, suspicious for acute edema or pneumonia superimposed on underlying chronic disease. Small right-sided pleural effusion. Electronically Signed   By: Donavan Foil M.D.   On: 10/05/2018 23:52   Dg Chest Port 1 View  Result Date: 10/06/2018 CLINICAL DATA:  33 year old female with centralized placement. EXAM: PORTABLE  CHEST 1 VIEW COMPARISON:  Chest radiograph dated 10/05/2018 FINDINGS: Right IJ central venous line with tip over the cavoatrial junction. No pneumothorax. Bilateral airspace opacities similar to prior radiograph. There is a small right pleural effusion. No acute osseous pathology. IMPRESSION: 1. Interval placement of a right IJ central venous line with tip in the region of the cavoatrial junction. No pneumothorax. 2. No significant interval change in the bilateral airspace opacities. Electronically Signed   By: Anner Crete M.D.   On: 10/06/2018 04:04    Pending Labs Unresulted Labs (From admission, onward)    Start     Ordered   10/07/18 0500  CBC  Tomorrow morning,   R     10/06/18 0538   10/07/18 [id number]  Basic metabolic panel  Tomorrow morning,   R     10/06/18 0538   10/06/18 0100  Pneumocystis smear by DFA  Once,   R     10/06/18 0100   10/06/18 0021  Pregnancy,  urine  ONCE - STAT,   STAT     10/06/18 0020   10/05/18 2313  Culture, blood (Routine x 2)  BLOOD CULTURE X 2,   STAT     10/05/18 2312   10/05/18 2313  Urinalysis, Routine w reflex microscopic  ONCE - STAT,   STAT     10/05/18 2312          Vitals/Pain Today's Vitals   10/06/18 0818 10/06/18 0830 10/06/18 0845 10/06/18 0900  BP: (!) 119/93 118/74 105/61   Pulse: (!) 133     Resp: (!) 36 (!) 28 (!) 39 (!) 39  Temp: (!) 97.4 F (36.3 C)     TempSrc: Axillary     SpO2:      Weight:      Height:      PainSc:        Isolation Precautions No active isolations  Medications Medications  atovaquone (MEPRON) 750 MG/5ML suspension 1,500 mg (has no administration in time range)  azithromycin (ZITHROMAX) tablet 1,200 mg (has no administration in time range)  bictegravir-emtricitabine-tenofovir AF (BIKTARVY) 50-200-25 MG per tablet 1 tablet (has no administration in time range)  benzonatate (TESSALON) capsule 100 mg (has no administration in time range)  dextromethorphan-guaiFENesin (MUCINEX DM) 30-600 MG per 12 hr  tablet 1 tablet (has no administration in time range)  0.9 %  sodium chloride infusion (Manually program via Guardrails IV Fluids) (has no administration in time range)  albuterol (PROVENTIL) (2.5 MG/3ML) 0.083% nebulizer solution 2.5 mg (has no administration in time range)  mometasone-formoterol (DULERA) 100-5 MCG/ACT inhaler 2 puff (2 puffs Inhalation Not Given 10/06/18 0806)  ceFEPIme (MAXIPIME) 2 g in sodium chloride 0.9 % 100 mL IVPB (has no administration in time range)  methylPREDNISolone sodium succinate (SOLU-MEDROL) 125 mg/2 mL injection 80 mg (80 mg Intravenous Given 10/06/18 0726)  diazepam (VALIUM) injection 2.5 mg (2.5 mg Intravenous Given 10/06/18 0727)  morphine 2 MG/ML injection 2 mg (has no administration in time range)  Chlorhexidine Gluconate Cloth 2 % PADS 6 each (has no administration in time range)  chlorhexidine (PERIDEX) 0.12 % solution 15 mL (has no administration in time range)  MEDLINE mouth rinse (has no administration in time range)  sodium chloride 0.9 % bolus 1,000 mL (0 mLs Intravenous Stopped 10/06/18 0609)  fentaNYL (SUBLIMAZE) injection 50 mcg (50 mcg Intravenous Given 10/06/18 0440)  acetaminophen (TYLENOL) tablet 650 mg (650 mg Oral Given 10/06/18 0634)  ipratropium-albuterol (DUONEB) 0.5-2.5 (3) MG/3ML nebulizer solution 3 mL (3 mLs Nebulization Given 10/06/18 0806)    Mobility walks

## 2018-10-06 NOTE — Progress Notes (Signed)
[facility] Progress Note  Patient: Krystal Short [id number]   PCP: Lajean Manes, MD DOB: [birth year]   DOA: 10/05/2018   DOS: 10/06/2018   Date of Service: the patient was seen and examined on 10/06/2018  Brief hospital course: Pt. with PMH of advanced HIV (CD4 58 09/09/2018, viral RNA <20 08/15/2018 on Biktarvy, azithromycin, and atovaquone), normocytic anemia, thrombocytopenia, and recent admission for sepsis due to suspected pneumocystis jirovecii pneumonia; admitted on 10/05/2018, presented with complaint of cough and shortness of breath, was found to have acute pneumonia. Recent admission. > 06/14/2018-07/16/2018 Bordetella bronchus infection.  Underwent bronc 07/12/2018.  Biopsy scant lung parenchyma with fibrosis and reactive changes. Required 4 PRBC and 3 platelet infusion. >07/31/2026-08/22/2018 sepsis from suspected PJP pneumonia.  Treated with steroids clindamycin and primaquine. Required 8 PRBC and 7 platelet transfusion.  RVP positive for routine coronavirus. > 09/03/2018-09/13/2018 generalized weakness and deconditioning.  Required 3 PRBC 8 platelet transfusion. IVIG on 09/05/2018 without any improvement in platelet count.  Given prolonged prednisone taper. > 10/01/2018-10/04/2018 presented with anemia and poor IV access.  Given 2 PRBC 1 platelet transfusion.  Required pressors.  Treated with ceftriaxone and doxy for CAP.  Currently further plan is further work-up for dyspnea, monitor recommendation from pulmonary, ID, heme-onc.  Subjective: Continues to have severe shortness of breath reports severe anxiety.  On BiPAP.  Assessment and Plan: Acute respiratory distress. Acute hypoxic respiratory failure. Presents to the hospital with shortness of breath. ABG performed showed normal pH, very low PCO2 concerning for hypoventilation.  PO2 of 132 on 4 L of nasal cannula. Currently on BiPAP due to increased work of breathing. Lactic acid is normal this time. Chest x-ray shows no acute  abnormality. Prior blood, fungal, bone marrow cultures are negative.   Prior bone marrow acid-fast smear is negative.   Negative respiratory viral panel 09/03/2018, positive coronavirus 229E on respiratory viral panel 08/02/2018 SARS-CoV-2 test negative 10/01/2018, 10/05/2018, 09/03/2018, 08/01/2018, and 07/30/2018 bronchoscopy 07/12/2018 showed scant lung parenchyma with fibrosis and reactive changes.  No granulomata identified. Few Bordetella bronchiseptica were grown on culture. Legionella and strep pneumo negative 09/03/2018  -Will probably require a PE study/noncontrast CT scan. -monitor recommendation from pulmonary. -Currently admitted to stepdown unit. -Continue steroids continue nebulization. -Limited echocardiogram.  Symptomatic anemia with severe thrombocytopenia: Bone biopsy 09/11/2018 showed hypercellular bone marrow for age with trilineage hematopoiesis, peripheral blood normocytic normochromic anemia, neutrophilic left shift, thrombocytopenia.  Recent admissions requiring multiple transfusions of PRBCs and platelets.  Also received IVIG on past admission. Etiology not clear. But primarily this is the limiting factor in getting the biopsy done for her lung issue.  -monitor recommendation from hematology. -Patient is currently receiving platelet transfusion. -recheck the levels after the transfusion. -Currently on IV Solu-Medrol 80 mg twice daily. -Avoid medication that can cause further pancytopenia.  Advanced HIV: CD4 58 09/09/2018, viral RNA <20 08/15/2018.  Patient reports adherence to home medications. -Continue Biktarvy, atovaquone, and weekly azithromycin -ID consulted for further input.  Acute kidney injury: Likely prerenal from anemia and hypotension. -Avoid nephrotoxic medications. -Treat with albumin to avoid volume overload.   Generalized weakness: Suspect secondary to deconditioning and anemia.  Continue fluids, blood transfusion as above.  PT eval.  Anxiety. -PRN  morphine. -Patient is clearly hyperventilating  Poor IV access. Central line placed by ED on 10/06/2018 morning. Will reevaluate necessity of the line.  Diet: Regular diet DVT Prophylaxis: SCD, pharmacological prophylaxis contraindicated due to Thrombocytopenia and anemia  Advance goals of care discussion: Full code  Family  Communication: no family was present at bedside, at the time of interview.   Disposition:  Discharge to Home  Consultants: Pulmonary, infectious disease, hematology Procedures: BiPAP Echocardiogram pending  Scheduled Meds: . sodium chloride   Intravenous Once  . atovaquone  1,500 mg Oral Daily  . [START ON 10/08/2018] azithromycin  1,200 mg Oral Q Tue  . benzonatate  100 mg Oral TID  . bictegravir-emtricitabine-tenofovir AF  1 tablet Oral Daily  . chlorhexidine  15 mL Mouth Rinse BID  . Chlorhexidine Gluconate Cloth  6 each Topical Daily  . dextromethorphan-guaiFENesin  1 tablet Oral BID  . mouth rinse  15 mL Mouth Rinse q12n4p  . methylPREDNISolone (SOLU-MEDROL) injection  80 mg Intravenous Q12H  . mometasone-formoterol  2 puff Inhalation BID  . romiPLOStim  2 mcg/kg Subcutaneous Once   Continuous Infusions: . albumin human    . ceFEPime (MAXIPIME) IV     PRN Meds: albuterol, diazepam, morphine injection Antibiotics: Anti-infectives (From admission, onward)   Start     Dose/Rate Route Frequency Ordered Stop   10/08/18 1000  azithromycin (ZITHROMAX) tablet 1,200 mg     1,200 mg Oral Every Tue 10/06/18 0520     10/06/18 1800  ceFEPIme (MAXIPIME) 2 g in sodium chloride 0.9 % 100 mL IVPB     2 g 200 mL/hr over 30 Minutes Intravenous Every 12 hours 10/06/18 0632     10/06/18 1000  atovaquone (MEPRON) 750 MG/5ML suspension 1,500 mg     1,500 mg Oral Daily 10/06/18 0520     10/06/18 1000  bictegravir-emtricitabine-tenofovir AF (BIKTARVY) 50-200-25 MG per tablet 1 tablet     1 tablet Oral Daily 10/06/18 0520     10/06/18 0045  ceFEPIme (MAXIPIME) 2 g in  sodium chloride 0.9 % 100 mL IVPB  Status:  Discontinued     2 g 200 mL/hr over 30 Minutes Intravenous Every 8 hours 10/06/18 0032 10/06/18 0632   10/06/18 0030  sulfamethoxazole-trimethoprim (BACTRIM) 260 mg of trimethoprim in dextrose 5 % 250 mL IVPB  Status:  Discontinued     260 mg of trimethoprim 266.3 mL/hr over 60 Minutes Intravenous Every 8 hours 10/06/18 0029 10/06/18 0801       Objective: Physical Exam: Vitals:   10/06/18 0845 10/06/18 0900 10/06/18 0941 10/06/18 1000  BP: 105/61 91/60 99/65  101/60  Pulse:      Resp: (!) 39 (!) 39  (!) 36  Temp:   (!) 97.4 F (36.3 C)   TempSrc:   Axillary   SpO2:   99%   Weight:   55.4 kg   Height:   5\' 1"  (1.549 m)     Intake/Output Summary (Last 24 hours) at 10/06/2018 1016 Last data filed at 10/06/2018 0945 Gross per 24 hour  Intake 996 ml  Output -  Net 996 ml   Filed Weights   10/05/18 2256 10/06/18 0941  Weight: 51.3 kg 55.4 kg   General: alert and oriented to time, place, and person. Appear in severe distress, affect appropriate Eyes: PERRL, Conjunctiva normal ENT: Oral Mucosa Clear, moist  Neck: difficult to assess  JVD, no Abnormal Mass Or lumps Cardiovascular: S1 and S2 Present, no Murmur, peripheral pulses symmetrical Respiratory: increased  respiratory effort, Bilateral Air entry equal and Decreased, no use of accessory muscle, bilateral Crackles, no wheezes Abdomen: Bowel Sound present, Soft and no tenderness, no hernia Skin: no rashes diffuse skin papules. Extremities: no Pedal edema, no calf tenderness Neurologic: normal without focal findings, mental status, speech normal,  alert and oriented x3, PERLA, Motor strength 5/5 and symmetric and sensation grossly normal to light touch Gait not checked due to patient safety concerns  Data Reviewed: CBC: Recent Labs  Lab 10/01/18 1219 10/02/18 0653 10/03/18 0210 10/04/18 0514 10/06/18 0136 10/06/18 0300  WBC 16.1* 17.9* 24.2* 20.4* 20.9*  --   NEUTROABS 13.0  14.7* 16.0* 12.5*  --   --   HGB 6.0* 10.8*  10.6* 10.1* 8.5* 7.9* 7.8*  HCT 18.8* 35.5*  33.5* 31.8* 26.0* 24.6* 23.0*  MCV 93.5 98.9 92.4 92.5 93.9  --   PLT 39* 30* 14* 13* 9*  --    Basic Metabolic Panel: Recent Labs  Lab 10/01/18 1832 10/02/18 0653 10/03/18 0210 10/04/18 0514 10/05/18 0120 10/06/18 0300  NA 130* 135 133* 134* 137 138  K 4.0 4.0 4.4 4.1 4.3 4.2  CL 105 112* 109 110 112* 112*  CO2 16* 16* 11* 16* 16*  --   GLUCOSE 130* 101* 143* 111* 134* 130*  BUN 34* 26* 35* 43* 62* 54*  CREATININE 1.45* 1.09* 1.17* 1.05* 1.57* 1.50*  CALCIUM 7.2* 7.5* 7.4* 7.5* 7.6*  --   MG  --   --  1.4* 2.3  --   --     Liver Function Tests: Recent Labs  Lab 10/01/18 1832 10/03/18 0210 10/04/18 0514 10/05/18 0120  AST 21 31 18 28   ALT 10 10 10 11   ALKPHOS 189* 146* 125 191*  BILITOT 1.9* 2.3* 1.6* 3.1*  PROT 6.4* 6.0* 6.2* 6.0*  ALBUMIN 1.9* 1.7* 1.7* 1.6*   No results for input(s): LIPASE, AMYLASE in the last 168 hours. No results for input(s): AMMONIA in the last 168 hours. Coagulation Profile: Recent Labs  Lab 10/01/18 1832 10/05/18 0120  INR 1.3* 1.4*   Cardiac Enzymes: No results for input(s): CKTOTAL, CKMB, CKMBINDEX, TROPONINI in the last 168 hours. BNP (last 3 results) No results for input(s): PROBNP in the last 8760 hours. CBG: No results for input(s): GLUCAP in the last 168 hours. Studies: Dg Chest 2 View  Result Date: 10/05/2018 CLINICAL DATA:  Suspected sepsis EXAM: CHEST - 2 VIEW COMPARISON:  10/02/2018, 10/01/2018, 09/19/2018, 05/28/2018, 06/03/2017, 03/11/2013, 10/15/2017, 07/12/2018 FINDINGS: Small right-sided pleural effusion. Underlying reticular interstitial changes suggesting component of chronic disease, however there is interim worsening of bilateral right greater than left airspace disease, suspicious for acute edema or pneumonia superimposed on chronic underlying disease. Stable cardiomediastinal silhouette. No pneumothorax. IMPRESSION: 1.  Interval worsening of bilateral right greater than left airspace disease, suspicious for acute edema or pneumonia superimposed on underlying chronic disease. Small right-sided pleural effusion. Electronically Signed   By: Donavan Foil M.D.   On: 10/05/2018 23:52   Dg Chest Port 1 View  Result Date: 10/06/2018 CLINICAL DATA:  33 year old female with centralized placement. EXAM: PORTABLE CHEST 1 VIEW COMPARISON:  Chest radiograph dated 10/05/2018 FINDINGS: Right IJ central venous line with tip over the cavoatrial junction. No pneumothorax. Bilateral airspace opacities similar to prior radiograph. There is a small right pleural effusion. No acute osseous pathology. IMPRESSION: 1. Interval placement of a right IJ central venous line with tip in the region of the cavoatrial junction. No pneumothorax. 2. No significant interval change in the bilateral airspace opacities. Electronically Signed   By: Anner Crete M.D.   On: 10/06/2018 04:04     Time spent: 35 minutes   Author: Berle Mull, MD [facility] 10/06/2018 10:16 AM  To reach On-call, see care teams to locate the attending and reach  out to them via [email or web address]. If 7PM-7AM, please contact night-coverage If you still have difficulty reaching the attending provider, please page the [facility] (Director on Call) for [facility] on amion for assistance.

## 2018-10-06 NOTE — Progress Notes (Signed)
HEMATOLOGY-ONCOLOGY PROGRESS NOTE  SUBJECTIVE: Worsening anemia and thrombocytopenia Significant past medical history of HIV and sepsis due to suspected pneumocystisjiroveciipneumonia has been dealing with anemia and thrombocytopenia for the past several months.  Dr.Kale had performed extensive work-up including a bone marrow biopsy and has been treating her with Nplate for the thrombocytopenia.  Her platelet counts have improved initially and subsequently got worse again with the current hospitalization.   The bone marrow biopsy revealed hypercellular bone marrow with trilineage hematopoiesis.  Prior evaluations had mild splenomegaly. On 09/24/2018, platelet counts 116.  Nplate was discontinued then.  Previously she had received IVIG and steroids.  OBJECTIVE: REVIEW OF SYSTEMS:   As above all other systems are negative  PHYSICAL EXAMINATION: ECOG PERFORMANCE STATUS: 3 - Symptomatic, >50% confined to bed  Vitals:   10/06/18 0845 10/06/18 0900  BP: 105/61   Pulse:    Resp: (!) 39 (!) 39  Temp:    SpO2:     Filed Weights   10/05/18 2256  Weight: 113 lb (51.3 kg)    Physical exam not done.  LABORATORY DATA:  I have reviewed the data as listed CMP Latest Ref Rng & Units 10/06/2018 10/05/2018 10/04/2018  Glucose 70 - 99 mg/dL 130(H) 134(H) 111(H)  BUN 6 - 20 mg/dL 54(H) 62(H) 43(H)  Creatinine 0.44 - 1.00 mg/dL 1.50(H) 1.57(H) 1.05(H)  Sodium 135 - 145 mmol/L 138 137 134(L)  Potassium 3.5 - 5.1 mmol/L 4.2 4.3 4.1  Chloride 98 - 111 mmol/L 112(H) 112(H) 110  CO2 22 - 32 mmol/L - 16(L) 16(L)  Calcium 8.9 - 10.3 mg/dL - 7.6(L) 7.5(L)  Total Protein 6.5 - 8.1 g/dL - 6.0(L) 6.2(L)  Total Bilirubin 0.3 - 1.2 mg/dL - 3.1(H) 1.6(H)  Alkaline Phos 38 - 126 U/L - 191(H) 125  AST 15 - 41 U/L - 28 18  ALT 0 - 44 U/L - 11 10    Lab Results  Component Value Date   WBC 20.9 (H) 10/06/2018   HGB 7.8 (L) 10/06/2018   HCT 23.0 (L) 10/06/2018   MCV 93.9 10/06/2018   PLT 9 (LL) 10/06/2018    NEUTROABS 12.5 (H) 10/04/2018    ASSESSMENT AND PLAN: 1.  Severe thrombocytopenia: Platelet count of 9. We will reinitiate Nplate.  The goal of Nplate is to raise platelet count to 50 K or better. She has been started on steroids by Dr. Patel. 2.  Pneumocystis pneumonia: If possible we should try to avoid Bactrim or any other drugs that can reduce platelet counts. 3.  Transfuse platelets if there is evidence of bleeding. 4.  Severe anemia: Hemoglobin 7.8: Previously received multiple units of blood transfusions.  Normocytic anemia with extensive work-up previously done that did not reveal any clear-cut etiology.  We would attribute this to anemia of chronic disease. Transfuse as needed 5.  HIV/AIDS: It is also noted that patients with HIV tend to have immune mediated thrombocytopenia as well as primary bone marrow suppression.  It is difficult to find attribution because of so many factors including the current issue with sepsis and pneumocystis infection.  Dr.Kale will follow the patient from tomorrow onwards.  

## 2018-10-06 NOTE — ED Notes (Signed)
I have just phoned report to [place], [place] in ICU. Will transport as soon as RT is able to assist.

## 2018-10-06 NOTE — Consult Note (Signed)
[facility]       Reason for Consult: hypoxia, HIV   Referring Physician: Dr. Posey Pronto  Active Problems:   AIDS (acquired immune deficiency syndrome) ([place])   Suspected pneumocystis pneumonia ([place])   Thrombocytopenia ([place])   PCP (pneumocystis jiroveci pneumonia) ([place])   . sodium chloride   Intravenous Once  . atovaquone  1,500 mg Oral Daily  . [START ON 10/08/2018] azithromycin  1,200 mg Oral Q Tue  . benzonatate  100 mg Oral TID  . bictegravir-emtricitabine-tenofovir AF  1 tablet Oral Daily  . chlorhexidine  15 mL Mouth Rinse BID  . Chlorhexidine Gluconate Cloth  6 each Topical Daily  . dextromethorphan-guaiFENesin  1 tablet Oral BID  . mouth rinse  15 mL Mouth Rinse q12n4p  . methylPREDNISolone (SOLU-MEDROL) injection  80 mg Intravenous Q12H  . mometasone-formoterol  2 puff Inhalation BID  . romiPLOStim  2 mcg/kg Subcutaneous Once    Recommendations: Continue steroids, antibiotics for now Continue Biktarvy  Consider interventions once platelets recover more  Assessment: She has a long standing issue with hypoxia, diffuse and persistent bilateral pulmonary ground glass opacities of unknown etiology.  She has been treated for PJP pneumonia, bacterial pneumonia and with steroids.  She has bone marrow suppression as well with platelets of just 9 which have trended down from 16 earlier this month.  She has had a work up including bone marrow biopsy, BAL and nothing positive.  Additionally, her CD4 has remained low despite good compliance with her ARVs. Open lung biopsy has been considered but not yet done.  At this point, I doubt this is a bacterial or PJP process.  CMV has been considered but treatment could worsen her anemia and thrombocytopenia and of unknown benefit.  She has had prominent lymphadenopathy but any intervention including biopsies difficult at this time with platelets of just 9.    Antibiotics: cefepime  HPI: Teria Khachatryan is a 33 y.o.  female with known HIV on Biktarvy and compliant but multiple hospitatlizations and work up for chronic pulmonary findings and pancytopenia with no etiology found.  Her most recent CD4 was 58 and viral low < 20 on 7/9.  She was just discharged 2 days ago after improving with her typical symptoms and back again with SOB and hypoxia.  Now requiring bipap.  Platelets down to 9, remains anemic.  Also placed on Bactrim for possible PJP.   CT from June, CXR here independently reviewed and diffuse disease noted.   Review of Systems:  Constitutional: positive for fatigue and anorexia Respiratory: positive for dyspnea on exertion Gastrointestinal: negative for nausea and diarrhea All other systems reviewed and are negative    Past Medical History:  Diagnosis Date  . Acute hyponatremia 06/03/2017  . Anemia   . CAP (community acquired pneumonia) 06/03/2017  . Facial dermatitis 06/03/2017  . HIV (human immunodeficiency virus infection) ([place])    diagnosed in April 2019  . Pleurisy 06/03/2017  . 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 ([place])   . Thrush of mouth and esophagus ([place])   . UTI (urinary tract infection)     Social History   Tobacco Use  . Smoking status: Former Smoker    Packs/day: 0.25    Years: 8.00    Pack years: 2.00    Types: Cigarettes, Cigars    Quit date: 12/25/2016    Years since quitting: 1.7  . Smokeless tobacco: Never Used  Substance Use Topics  . Alcohol use:  Yes    Comment: weekend/socially  . Drug use: No    Family History  Problem Relation Age of Onset  . Healthy Father   . Healthy Mother   . Asthma Paternal Grandmother     Allergies  Allergen Reactions  . Heparin Other (See Comments)    Unknown reaction    Physical Exam: Constitutional: no current distress; tired appearing Vitals:   10/06/18 0845 10/06/18 0900  BP: 105/61   Pulse:    Resp: (!) 39 (!) 39  Temp:    SpO2:     EYES: anicteric ENMT: + bipap Cardiovascular: Cor Tachy  Respiratory: diffuse crackles throughout both sides GI: distended, soft, non-tender and positive normoactive bowel sounds Musculoskeletal: no edema Skin: negatives: no rash Hematologic: no cervical lad  Lab Results  Component Value Date   WBC 20.9 (H) 10/06/2018   HGB 7.8 (L) 10/06/2018   HCT 23.0 (L) 10/06/2018   MCV 93.9 10/06/2018   PLT 9 (LL) 10/06/2018    Lab Results  Component Value Date   CREATININE 1.50 (H) 10/06/2018   BUN 54 (H) 10/06/2018   NA 138 10/06/2018   K 4.2 10/06/2018   CL 112 (H) 10/06/2018   CO2 16 (L) 10/05/2018    Lab Results  Component Value Date   ALT 11 10/05/2018   AST 28 10/05/2018   ALKPHOS 191 (H) 10/05/2018     Microbiology: Recent Results (from the past 240 hour(s))  Blood Culture (routine x 2)     Status: None   Collection Time: 10/01/18  6:32 PM   Specimen: BLOOD RIGHT ARM  Result Value Ref Range Status   Specimen Description   Final    BLOOD RIGHT ARM Performed at [facility], [place] [address] [place], [place] [address]    Special Requests   Final    BOTTLES DRAWN AEROBIC AND ANAEROBIC Blood Culture adequate volume Performed at [facility] [address], [place], [place] [address]    Culture   Final    NO GROWTH 5 DAYS Performed at [facility], [place] [address], [place], [place] [address]    Report Status 10/06/2018 FINAL  Final  Urine culture     Status: Abnormal   Collection Time: 10/01/18  6:32 PM   Specimen: In/Out Cath Urine  Result Value Ref Range Status   Specimen Description   Final    IN/OUT CATH URINE Performed at [facility] [address], [place], [place] [address]    Special Requests   Final    NONE Performed at [facility], [place] [address], [place], [place] [address]    Culture (A)  Final    <10,000 COLONIES/mL INSIGNIFICANT GROWTH Performed at [facility] [address], [place],  [address]    Report  Status 10/02/2018 FINAL  Final  Novel Coronavirus, NAA (Hosp order, Send-out to Ref Lab; TAT 18-24 hrs     Status: None   Collection Time: 10/01/18  6:55 PM  Result Value Ref Range Status   SARS-CoV-2, NAA NOT DETECTED NOT DETECTED Final    Comment: (NOTE) This test was developed and its performance characteristics determined by [employer]. This test has not been FDA cleared or approved. This test has been authorized by FDA under an Emergency Use Authorization (EUA). This test is only authorized for the duration of time the declaration that circumstances exist justifying the authorization of the emergency use of in vitro diagnostic  tests for detection of SARS-CoV-2 virus and/or diagnosis of COVID-19 infection under section 564(b)(1) of the Act, 21 U.S.C. 643PIR-5(J)(8), unless the authorization is terminated or revoked sooner. When diagnostic testing is negative, the possibility of a false negative result should be considered in the context of a patient's recent exposures and the presence of clinical signs and symptoms consistent with COVID-19. An individual without symptoms of COVID-19 and who is not shedding SARS-CoV-2 virus would expect to have a negative (not detected) result in this assay. Performed  At: [facility] [place], [place] [id number] Rush Farmer MD [id number]    [place]  Final    Comment: Performed at [facility] [address], [place], [place] [address]  MRSA PCR Screening     Status: None   Collection Time: 10/01/18 10:44 PM   Specimen: Nasal Mucosa; Nasopharyngeal  Result Value Ref Range Status   MRSA by PCR NEGATIVE NEGATIVE Final    Comment:        The GeneXpert MRSA Assay (FDA approved for NASAL specimens only), is one component of a comprehensive MRSA colonization surveillance program. It is not intended to diagnose MRSA infection nor to guide or monitor treatment for  MRSA infections. Performed at [facility], [place] [address] [place], [place] [address]   Blood Culture (routine x 2)     Status: None (Preliminary result)   Collection Time: 10/02/18 11:00 AM   Specimen: BLOOD RIGHT HAND  Result Value Ref Range Status   Specimen Description   Final    BLOOD RIGHT HAND Performed at [facility] [address], [place], [place] [address]    Special Requests   Final    BOTTLES DRAWN AEROBIC AND ANAEROBIC Blood Culture adequate volume Performed at [facility] [address], [place], [place] [address]    Culture   Final    NO GROWTH 4 DAYS Performed at [facility], [place] [address] [place], [place] [address]    Report Status PENDING  Incomplete  SARS Coronavirus 2 [facility] order, Performed in [facility] hospital lab) Nasopharyngeal Nasopharyngeal Swab     Status: None   Collection Time: 10/05/18 11:22 PM   Specimen: Nasopharyngeal Swab  Result Value Ref Range Status   SARS Coronavirus 2 NEGATIVE NEGATIVE Final    Comment: (NOTE) If result is NEGATIVE SARS-CoV-2 target nucleic acids are NOT DETECTED. The SARS-CoV-2 RNA is generally detectable in upper and lower  respiratory specimens during the acute phase of infection. The lowest  concentration of SARS-CoV-2 viral copies this assay can detect is 250  copies / mL. A negative result does not preclude SARS-CoV-2 infection  and should not be used as the sole basis for treatment or other  patient management decisions.  A negative result may occur with  improper specimen collection / handling, submission of specimen other  than nasopharyngeal swab, presence of viral mutation(s) within the  areas targeted by this assay, and inadequate number of viral copies  (<250 copies / mL). A negative result must be combined with clinical  observations, patient history, and epidemiological information. If result is POSITIVE SARS-CoV-2 target nucleic  acids are DETECTED. The SARS-CoV-2 RNA is generally detectable in upper and lower  respiratory specimens dur ing the acute phase of infection.  Positive  results are indicative of active infection with SARS-CoV-2.  Clinical  correlation with patient history and other diagnostic information is  necessary to determine patient infection  status.  Positive results do  not rule out bacterial infection or co-infection with other viruses. If result is PRESUMPTIVE POSTIVE SARS-CoV-2 nucleic acids MAY BE PRESENT.   A presumptive positive result was obtained on the submitted specimen  and confirmed on repeat testing.  While 2019 novel coronavirus  (SARS-CoV-2) nucleic acids may be present in the submitted sample  additional confirmatory testing may be necessary for epidemiological  and / or clinical management purposes  to differentiate between  SARS-CoV-2 and other Sarbecovirus currently known to infect humans.  If clinically indicated additional testing with an alternate test  methodology [phone]) is advised. The SARS-CoV-2 RNA is generally  detectable in upper and lower respiratory sp ecimens during the acute  phase of infection. The expected result is Negative. Fact Sheet for Patients:  StrictlyIdeas.no Fact Sheet for Healthcare Providers: BankingDealers.co.za This test is not yet approved or cleared by the [place] FDA and has been authorized for detection and/or diagnosis of SARS-CoV-2 by FDA under an Emergency Use Authorization (EUA).  This EUA will remain in effect (meaning this test can be used) for the duration of the COVID-19 declaration under Section 564(b)(1) of the Act, 21 U.S.C. section 360bbb-3(b)(1), unless the authorization is terminated or revoked sooner. Performed at [facility], [place] [address], [place], [place] [address]     Ayane Delancey W Artavia Jeanlouis, MD [email or web address] 10/06/2018, 9:43 AM

## 2018-10-06 NOTE — ED Notes (Signed)
Called [facility] - pt to be bipap

## 2018-10-06 NOTE — H&P (Signed)
History and Physical    Jazlyne Gauger BTD:[id number] DOB: 11-09-1985 DOA: 10/05/2018  PCP: Lajean Manes, MD (Confirm with patient/family/NH records and if not entered, this has to be entered at [facility] point of entry) Patient coming from: Patient is coming from home  I have personally briefly reviewed patient's old medical records in [place]  Chief Complaint: Shortness of breath and increased work of breathing  HPI: Krystal Short is a 33 y.o. female with medical history significant of advanced HIV AIDS with a several recent hospitalizations for pneumocystis jirovecii pneumonia with her recently being discharged on 10/04/2018 on home IV antibiotics and doxycycline. She became more short of breath today with increased work of breathing and returns to the [facility] for evaluation. See last progrress note from 8/27 for a full description of her last hospital course. She also has Chronic aenemia and thrombcytopenia requiring multiple transfusions of RBCs and platelets. She has had a thorough workup including bone marrow biopsy. She is now admitted with respiratory distress and profound thrombocytopenia. She does admit to respiratory fatigue.  ED Course: Patient had routine labs revealing Hgb 7.9, WBC 20.9, Plt 9. ABG with PO2 132, PC)2 19, pH 7.44. CXR with worsening ground glass infiltrates bilaterally with R>L. Cefipeime IV initiated and Septra IV initiated. Central line place by IV team.   Review of Systems: As per HPI otherwise 10 point review of systems negative.    Past Medical History:  Diagnosis Date  . Acute hyponatremia 06/03/2017  . Anemia   . CAP (community acquired pneumonia) 06/03/2017  . Facial dermatitis 06/03/2017  . HIV (human immunodeficiency virus infection) ([place])    diagnosed in April 2019  . Pleurisy 06/03/2017  . 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 ([place])   . Thrush of mouth and esophagus ([place])   . UTI (urinary tract infection)     Past Surgical  History:  Procedure Laterality Date  . BIOPSY  07/12/2018   Procedure: BIOPSY;  Surgeon: Laurin Coder, MD;  Location: [facility];  Service: Endoscopy;;  . BRONCHIAL WASHINGS  07/12/2018   Procedure: BRONCHIAL WASHINGS;  Surgeon: Laurin Coder, MD;  Location: [facility];  Service: Endoscopy;;  . ENDOBRONCHIAL ULTRASOUND N/A 07/12/2018   Procedure: ENDOBRONCHIAL ULTRASOUND;  Surgeon: Laurin Coder, MD;  Location: [facility];  Service: Endoscopy;  Laterality: N/A;  . FINE NEEDLE ASPIRATION BIOPSY  07/12/2018   Procedure: FINE NEEDLE ASPIRATION BIOPSY;  Surgeon: Laurin Coder, MD;  Location: [facility];  Service: Endoscopy;;  . NO PAST SURGERIES    . VIDEO BRONCHOSCOPY N/A 07/12/2018   Procedure: VIDEO BRONCHOSCOPY WITHOUT FLUORO;  Surgeon: Laurin Coder, MD;  Location: [facility];  Service: Endoscopy;  Laterality: N/A;   Social Hx  - married x 2 years, no children. 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 Has brother in town and her mother is coming to town.  reports that she quit smoking about 21 months ago. Her smoking use included cigarettes and cigars. She has a 2.00 pack-year smoking history. She has never used smokeless tobacco. She reports current alcohol use. She reports that she does not use drugs.  Allergies  Allergen Reactions  . Heparin Other (See Comments)    Unknown reaction    Family History  Problem Relation Age of Onset  . Healthy Father   . Healthy Mother   . Asthma Paternal Grandmother    Unacceptable: Noncontributory, unremarkable, or negative. Acceptable: Family history reviewed and not pertinent (If you reviewed it)  Prior to Admission medications   Medication Sig Start Date End Date Taking? Authorizing Provider  albuterol (VENTOLIN HFA) 108 (90 Base) MCG/ACT inhaler Inhale 2 puffs into the lungs every 6 (six) hours as needed for wheezing or shortness of breath.   Yes [provider]  atovaquone (MEPRON) 750 MG/5ML suspension Take 10 mLs  (1,500 mg total) by mouth daily. 09/18/18 10/18/18 Yes Golden Circle, FNP  azithromycin (ZITHROMAX) 600 MG tablet Take 2 tablets (1,200 mg total) by mouth once a week. Patient taking differently: Take 1,200 mg by mouth every Tuesday.  09/17/18  Yes Oswald Hillock, MD  bictegravir-emtricitabine-tenofovir AF (BIKTARVY) 50-200-25 MG TABS tablet Take 1 tablet by mouth daily. 09/18/18  Yes Golden Circle, FNP  cefdinir (OMNICEF) 300 MG capsule Take 1 capsule (300 mg total) by mouth 2 (two) times daily for 3 days. 10/04/18 10/07/18 Yes Lavina Hamman, MD  doxycycline (VIBRA-TABS) 100 MG tablet Take 1 tablet (100 mg total) by mouth 2 (two) times daily for 3 days. 10/04/18 10/07/18 Yes Lavina Hamman, MD  predniSONE (DELTASONE) 10 MG tablet Take 57m daily for 7 days,Take 362mdaily for 7 days,Take 2063maily for 7 days,Take 73m63mily for 7 days, Take 73mg15mry other day for 7 days  then stop Patient taking differently: Take 10-40 mg by mouth See admin instructions. Take 40mg 6my for 7 days,Take 30mg d45m for 7 days,Take 20mg da17mfor 7 days,Take 73mg dai77mor 7 days, Take 73mg ever27mher day for 7 days  then stop 10/04/18  Yes Patel, PraLavina Hammanonatate (TESSALON) 100 MG capsule Take 1 capsule (100 mg total) by mouth 3 (three) times daily. 10/04/18   Patel, PraLavina Hammanromethorphan-guaiFENesin (MUCINEX [facility]0 MG 12hr tablet Take 1 tablet by mouth 2 (two) times daily. 10/04/18   Patel, PraLavina Hammanysical Exam: Vitals:   10/06/18 0429 10/06/18 0430 10/06/18 0515 10/06/18 0556  BP: 100/67 (!) 93/58 94/66 115/66  Pulse: (!) 114 (!) 113 (!) 114 (!) 123  Resp: (!) 30 (!) 37  (!) 29  Temp: 97.9 F (36.6 C)     TempSrc: Oral   Oral  SpO2: 100% 100% 100% 95%  Weight:      Height:        Constitutional: NAD, calm, comfortable Vitals:   10/06/18 0429 10/06/18 0430 10/06/18 0515 10/06/18 0556  BP: 100/67 (!) 93/58 94/66 115/66  Pulse: (!) 114 (!) 113 (!) 114 (!) 123  Resp: (!)  30 (!) 37  (!) 29  Temp: 97.9 F (36.6 C)     TempSrc: Oral   Oral  SpO2: 100% 100% 100% 95%  Weight:      Height:       General  WNWD woman who is short of breath with increased WOB. Eyes: PERRL, lids and conjunctivae normal ENMT: Mucous membranes are moist. Posterior pharynx clear of any exudate or lesions.Normal dentition.  Neck: normal, supple, no masses, no thyromegaly Respiratory: increased WOB, using accessory muscles respiration, no neck retractions. Decreased breath sounds at bases, prolonged expiratory phase, diffuse rhonchi Cardiovascular: Regular rate at 88, no murmurs / rubs / gallops. No extremity edema. 2+ pedal pulses. No carotid bruits.  Abdomen: no tenderness, no masses palpated. No hepatosplenomegaly. Bowel sounds positive.  Musculoskeletal: no clubbing / cyanosis. No joint deformity upper and lower extremities. Good ROM, no contractures. Normal muscle tone.  Skin: no rashes except for mild facial dermatitis, lesions, ulcers. No induration  Neurologic: CN 2-12 grossly intact. Sensation intact, DTR normal. Strength 5/5 in all 4.  Psychiatric: Normal judgment and insight. Alert and oriented x 3. Normal mood.   (Anything < 9 systems with 2 bullets each down codes to level 1) (If patient refuses exam can't bill higher level) (Make sure to document decubitus ulcers present on admission -- if possible -- and whether patient has chronic indwelling catheter at time of admission)  Labs on Admission: I have personally reviewed following labs and imaging studies  CBC: Recent Labs  Lab 10/01/18 1219 10/02/18 0653 10/03/18 0210 10/04/18 0514 10/06/18 0136 10/06/18 0300  WBC 16.1* 17.9* 24.2* 20.4* 20.9*  --   NEUTROABS 13.0 14.7* 16.0* 12.5*  --   --   HGB 6.0* 10.8*  10.6* 10.1* 8.5* 7.9* 7.8*  HCT 18.8* 35.5*  33.5* 31.8* 26.0* 24.6* 23.0*  MCV 93.5 98.9 92.4 92.5 93.9  --   PLT 39* 30* 14* 13* 9*  --    Basic Metabolic Panel: Recent Labs  Lab 10/01/18 1832  10/02/18 0653 10/03/18 0210 10/04/18 0514 10/05/18 0120 10/06/18 0300  NA 130* 135 133* 134* 137 138  K 4.0 4.0 4.4 4.1 4.3 4.2  CL 105 112* 109 110 112* 112*  CO2 16* 16* 11* 16* 16*  --   GLUCOSE 130* 101* 143* 111* 134* 130*  BUN 34* 26* 35* 43* 62* 54*  CREATININE 1.45* 1.09* 1.17* 1.05* 1.57* 1.50*  CALCIUM 7.2* 7.5* 7.4* 7.5* 7.6*  --   MG  --   --  1.4* 2.3  --   --    GFR: Estimated Creatinine Clearance: 41 mL/min (A) (by C-G formula based on SCr of 1.5 mg/dL (H)). Liver Function Tests: Recent Labs  Lab 10/01/18 1832 10/03/18 0210 10/04/18 0514 10/05/18 0120  AST 21 31 18 28   ALT 10 10 10 11   ALKPHOS 189* 146* 125 191*  BILITOT 1.9* 2.3* 1.6* 3.1*  PROT 6.4* 6.0* 6.2* 6.0*  ALBUMIN 1.9* 1.7* 1.7* 1.6*   No results for input(s): LIPASE, AMYLASE in the last 168 hours. No results for input(s): AMMONIA in the last 168 hours. Coagulation Profile: Recent Labs  Lab 10/01/18 1832 10/05/18 0120  INR 1.3* 1.4*   Cardiac Enzymes: No results for input(s): CKTOTAL, CKMB, CKMBINDEX, TROPONINI in the last 168 hours. BNP (last 3 results) No results for input(s): PROBNP in the last 8760 hours. HbA1C: No results for input(s): HGBA1C in the last 72 hours. CBG: No results for input(s): GLUCAP in the last 168 hours. Lipid Profile: No results for input(s): CHOL, HDL, LDLCALC, TRIG, CHOLHDL, LDLDIRECT in the last 72 hours. Thyroid Function Tests: No results for input(s): TSH, T4TOTAL, FREET4, T3FREE, THYROIDAB in the last 72 hours. Anemia Panel: No results for input(s): VITAMINB12, FOLATE, FERRITIN, TIBC, IRON, RETICCTPCT in the last 72 hours. Urine analysis:    Component Value Date/Time   COLORURINE AMBER (A) 10/01/2018 1832   APPEARANCEUR HAZY (A) 10/01/2018 1832   LABSPEC 1.006 10/01/2018 1832   PHURINE 5.0 10/01/2018 1832   GLUCOSEU NEGATIVE 10/01/2018 1832   HGBUR NEGATIVE 10/01/2018 1832   BILIRUBINUR NEGATIVE 10/01/2018 1832   KETONESUR NEGATIVE 10/01/2018  1832   PROTEINUR NEGATIVE 10/01/2018 1832   UROBILINOGEN 1.0 12/03/2011 0550   NITRITE NEGATIVE 10/01/2018 1832   LEUKOCYTESUR NEGATIVE 10/01/2018 1832    Radiological Exams on Admission: Dg Chest 2 View  Result Date: 10/05/2018 CLINICAL DATA:  Suspected sepsis EXAM: CHEST - 2 VIEW COMPARISON:  10/02/2018, 10/01/2018, 09/19/2018, 05/28/2018, 06/03/2017, 03/11/2013,  10/15/2017, 07/12/2018 FINDINGS: Small right-sided pleural effusion. Underlying reticular interstitial changes suggesting component of chronic disease, however there is interim worsening of bilateral right greater than left airspace disease, suspicious for acute edema or pneumonia superimposed on chronic underlying disease. Stable cardiomediastinal silhouette. No pneumothorax. IMPRESSION: 1. Interval worsening of bilateral right greater than left airspace disease, suspicious for acute edema or pneumonia superimposed on underlying chronic disease. Small right-sided pleural effusion. Electronically Signed   By: Donavan Foil M.D.   On: 10/05/2018 23:52   Dg Chest Port 1 View  Result Date: 10/06/2018 CLINICAL DATA:  33 year old female with centralized placement. EXAM: PORTABLE CHEST 1 VIEW COMPARISON:  Chest radiograph dated 10/05/2018 FINDINGS: Right IJ central venous line with tip over the cavoatrial junction. No pneumothorax. Bilateral airspace opacities similar to prior radiograph. There is a small right pleural effusion. No acute osseous pathology. IMPRESSION: 1. Interval placement of a right IJ central venous line with tip in the region of the cavoatrial junction. No pneumothorax. 2. No significant interval change in the bilateral airspace opacities. Electronically Signed   By: Anner Crete M.D.   On: 10/06/2018 04:04    EKG: Independently reviewed. Sinus tachycardia  Assessment/Plan Active Problems:   Suspected pneumocystis pneumonia (HCC)   AIDS (acquired immune deficiency syndrome) (HCC)   Thrombocytopenia (HCC)   PCP  (pneumocystis jiroveci pneumonia) ([place])  (please populate well all problems here in Problem List. (For example, if patient is on BP meds at home and you resume or decide to hold them, it is a problem that needs to be her. Same for CAD, COPD, HLD and so on)   1. Pneumonia - patient with persistent/relapsing pneumonia despite continued antibiotic therapy. Has been diagnosed with Peumoncystis jvorecii recently. Now with worsening symptoms but adequate oxygenation currently on [facility]. Plan Admit to stepdown  Abx - cefipime initiated, IV septra initiated  LABA/steroid (dulera) BID and SABA nebs q 4 prn   Will order BiPAP to ameliorate WOB   CCM consult pending  Consider ID consult  2. Thrombocytopenia - plt 9! No evidence of bleeding and no report by patient of bleeding. Has had previous w/u. Plan - Transfuse 1 unit plts  F/u CBC  3. Anemia - chronic problem. Has had multiple transfusions at previous admissions. Hgb now 7.9. No transfusion at this time.  4. AIDS - continue home meds.    DVT prophylaxis: SCDs  (Lovenox/Heparin/SCD's/anticoagulated/None (if comfort care) Code Status: full code (Full/Partial (specify details) Family Communication: spoke with spouse - gave full up date (Specify name, relationship. Do not write "discussed with patient". Specify tel # if discussed over the phone) Disposition Plan: home when stable 3-5 days (specify when and where you expect patient to be discharged) Consults called: CCM - will be on their rounding card for this AM (with names) Admission status: Stepdown (inpatient / obs / tele / medical floor / SDU)   Adella Hare MD [facility] Pager [phone]  If 7PM-7AM, please contact night-coverage [email or web address] Password [email or web address]  10/06/2018, 6:01 AM

## 2018-10-07 ENCOUNTER — Inpatient Hospital Stay (HOSPITAL_COMMUNITY): Payer: 59

## 2018-10-07 DIAGNOSIS — R509 Fever, unspecified: Secondary | ICD-10-CM

## 2018-10-07 DIAGNOSIS — M7989 Other specified soft tissue disorders: Secondary | ICD-10-CM

## 2018-10-07 DIAGNOSIS — R7989 Other specified abnormal findings of blood chemistry: Secondary | ICD-10-CM

## 2018-10-07 DIAGNOSIS — J80 Acute respiratory distress syndrome: Secondary | ICD-10-CM | POA: Diagnosis not present

## 2018-10-07 DIAGNOSIS — N170 Acute kidney failure with tubular necrosis: Secondary | ICD-10-CM | POA: Diagnosis not present

## 2018-10-07 DIAGNOSIS — Z9911 Dependence on respirator [ventilator] status: Secondary | ICD-10-CM

## 2018-10-07 DIAGNOSIS — J189 Pneumonia, unspecified organism: Secondary | ICD-10-CM

## 2018-10-07 DIAGNOSIS — Z20828 Contact with and (suspected) exposure to other viral communicable diseases: Secondary | ICD-10-CM | POA: Diagnosis not present

## 2018-10-07 DIAGNOSIS — R6521 Severe sepsis with septic shock: Secondary | ICD-10-CM | POA: Diagnosis not present

## 2018-10-07 DIAGNOSIS — R0603 Acute respiratory distress: Secondary | ICD-10-CM

## 2018-10-07 DIAGNOSIS — Z0184 Encounter for antibody response examination: Secondary | ICD-10-CM | POA: Diagnosis not present

## 2018-10-07 DIAGNOSIS — A419 Sepsis, unspecified organism: Secondary | ICD-10-CM | POA: Diagnosis not present

## 2018-10-07 DIAGNOSIS — R579 Shock, unspecified: Secondary | ICD-10-CM

## 2018-10-07 DIAGNOSIS — R0902 Hypoxemia: Secondary | ICD-10-CM

## 2018-10-07 DIAGNOSIS — B20 Human immunodeficiency virus [HIV] disease: Secondary | ICD-10-CM | POA: Diagnosis not present

## 2018-10-07 DIAGNOSIS — B59 Pneumocystosis: Secondary | ICD-10-CM | POA: Diagnosis not present

## 2018-10-07 DIAGNOSIS — D61818 Other pancytopenia: Secondary | ICD-10-CM

## 2018-10-07 DIAGNOSIS — G9341 Metabolic encephalopathy: Secondary | ICD-10-CM | POA: Diagnosis not present

## 2018-10-07 LAB — TYPE AND SCREEN
ABO/RH(D): O POS
Antibody Screen: NEGATIVE
Unit division: 0
Unit division: 0

## 2018-10-07 LAB — BPAM RBC
Blood Product Expiration Date: 202009142359
Blood Product Expiration Date: 202009172359
ISSUE DATE / TIME: 202008301310
ISSUE DATE / TIME: 202008301525
Unit Type and Rh: 5100
Unit Type and Rh: 5100

## 2018-10-07 LAB — BASIC METABOLIC PANEL
Anion gap: 10 (ref 5–15)
BUN: 76 mg/dL — ABNORMAL HIGH (ref 6–20)
CO2: 15 mmol/L — ABNORMAL LOW (ref 22–32)
Calcium: 7.2 mg/dL — ABNORMAL LOW (ref 8.9–10.3)
Chloride: 114 mmol/L — ABNORMAL HIGH (ref 98–111)
Creatinine, Ser: 1.75 mg/dL — ABNORMAL HIGH (ref 0.44–1.00)
GFR calc Af Amer: 44 mL/min — ABNORMAL LOW (ref >60)
GFR calc non Af Amer: 38 mL/min — ABNORMAL LOW (ref >60)
Glucose, Bld: 136 mg/dL — ABNORMAL HIGH (ref 70–99)
Potassium: 5.5 mmol/L — ABNORMAL HIGH (ref 3.5–5.1)
Sodium: 139 mmol/L (ref 135–145)

## 2018-10-07 LAB — BLOOD GAS, ARTERIAL
Bicarbonate: 15.1 mmol/L — ABNORMAL LOW (ref 20.0–28.0)
Drawn by: 257881
FIO2: 100
MECHVT: 380 mL
O2 Saturation: 92.9 %
PEEP: 10 cmH2O
Patient temperature: 96.3
RATE: 18 resp/min
pCO2 arterial: 64.2 mmHg — ABNORMAL HIGH (ref 32.0–48.0)
pH, Arterial: 6.976 — CL (ref 7.350–7.450)
pO2, Arterial: 81.7 mmHg — ABNORMAL LOW (ref 83.0–108.0)

## 2018-10-07 LAB — COMPREHENSIVE METABOLIC PANEL
ALT: 28 U/L (ref 0–44)
AST: 119 U/L — ABNORMAL HIGH (ref 15–41)
Albumin: 2 g/dL — ABNORMAL LOW (ref 3.5–5.0)
Alkaline Phosphatase: 187 U/L — ABNORMAL HIGH (ref 38–126)
Anion gap: 10 (ref 5–15)
BUN: 79 mg/dL — ABNORMAL HIGH (ref 6–20)
CO2: 16 mmol/L — ABNORMAL LOW (ref 22–32)
Calcium: 6.9 mg/dL — ABNORMAL LOW (ref 8.9–10.3)
Chloride: 113 mmol/L — ABNORMAL HIGH (ref 98–111)
Creatinine, Ser: 1.92 mg/dL — ABNORMAL HIGH (ref 0.44–1.00)
GFR calc Af Amer: 40 mL/min — ABNORMAL LOW (ref >60)
GFR calc non Af Amer: 34 mL/min — ABNORMAL LOW (ref >60)
Glucose, Bld: 149 mg/dL — ABNORMAL HIGH (ref 70–99)
Potassium: 6.5 mmol/L (ref 3.5–5.1)
Sodium: 139 mmol/L (ref 135–145)
Total Bilirubin: 3.5 mg/dL — ABNORMAL HIGH (ref 0.3–1.2)
Total Protein: 6 g/dL — ABNORMAL LOW (ref 6.5–8.1)

## 2018-10-07 LAB — LIPID PANEL
Cholesterol: 123 mg/dL (ref 0–200)
HDL: <10 mg/dL — ABNORMAL LOW (ref >40)
Triglycerides: 234 mg/dL — ABNORMAL HIGH (ref <150)
VLDL: 47 mg/dL — ABNORMAL HIGH (ref 0–40)

## 2018-10-07 LAB — CULTURE, BLOOD (ROUTINE X 2)
Culture: NO GROWTH
Special Requests: ADEQUATE

## 2018-10-07 LAB — CBC
HCT: 36.6 % (ref 36.0–46.0)
Hemoglobin: 12.3 g/dL (ref 12.0–15.0)
MCH: 30.2 pg (ref 26.0–34.0)
MCHC: 33.6 g/dL (ref 30.0–36.0)
MCV: 89.9 fL (ref 80.0–100.0)
Platelets: 7 10*3/uL — CL (ref 150–400)
RBC: 4.07 MIL/uL (ref 3.87–5.11)
RDW: 17.7 % — ABNORMAL HIGH (ref 11.5–15.5)
WBC: 20.1 10*3/uL — ABNORMAL HIGH (ref 4.0–10.5)
nRBC: 2.8 % — ABNORMAL HIGH (ref 0.0–0.2)

## 2018-10-07 LAB — PROTIME-INR
INR: 1.9 — ABNORMAL HIGH (ref 0.8–1.2)
Prothrombin Time: 21.2 seconds — ABNORMAL HIGH (ref 11.4–15.2)

## 2018-10-07 LAB — LACTATE DEHYDROGENASE: LDH: 735 U/L — ABNORMAL HIGH (ref 98–192)

## 2018-10-07 LAB — GLUCOSE, CAPILLARY
Glucose-Capillary: 109 mg/dL — ABNORMAL HIGH (ref 70–99)
Glucose-Capillary: 116 mg/dL — ABNORMAL HIGH (ref 70–99)
Glucose-Capillary: 126 mg/dL — ABNORMAL HIGH (ref 70–99)
Glucose-Capillary: 135 mg/dL — ABNORMAL HIGH (ref 70–99)

## 2018-10-07 LAB — PREPARE PLATELET PHERESIS: Unit division: 0

## 2018-10-07 LAB — BPAM PLATELET PHERESIS
Blood Product Expiration Date: 202009012359
ISSUE DATE / TIME: 202008300810
Unit Type and Rh: 5100

## 2018-10-07 MED ORDER — SODIUM CHLORIDE 0.9% IV SOLUTION
Freq: Once | INTRAVENOUS | Status: AC
  Administered 2018-10-07: 07:00:00 via INTRAVENOUS

## 2018-10-07 MED ORDER — NOREPINEPHRINE 4 MG/250ML-% IV SOLN
0.0000 ug/min | INTRAVENOUS | Status: DC
  Administered 2018-10-07: 2 ug/min via INTRAVENOUS
  Administered 2018-10-07: 11 ug/min via INTRAVENOUS
  Administered 2018-10-08: 30 ug/min via INTRAVENOUS
  Filled 2018-10-07 (×6): qty 250

## 2018-10-07 MED ORDER — FENTANYL CITRATE (PF) 100 MCG/2ML IJ SOLN
25.0000 ug | INTRAMUSCULAR | Status: DC | PRN
  Administered 2018-10-07: 50 ug via INTRAVENOUS
  Administered 2018-10-07: 18:00:00 25 ug via INTRAVENOUS
  Administered 2018-10-07: 16:00:00 50 ug via INTRAVENOUS
  Administered 2018-10-07 (×2): 25 ug via INTRAVENOUS
  Administered 2018-10-08: 50 ug via INTRAVENOUS
  Administered 2018-10-08: 25 ug via INTRAVENOUS
  Administered 2018-10-10 (×3): 50 ug via INTRAVENOUS
  Administered 2018-10-10: 05:00:00 25 ug via INTRAVENOUS
  Filled 2018-10-07 (×8): qty 2

## 2018-10-07 MED ORDER — FENTANYL CITRATE (PF) 100 MCG/2ML IJ SOLN
INTRAMUSCULAR | Status: AC
  Administered 2018-10-07: 100 ug
  Filled 2018-10-07: qty 4

## 2018-10-07 MED ORDER — CHLORHEXIDINE GLUCONATE 0.12% ORAL RINSE (MEDLINE KIT)
15.0000 mL | Freq: Two times a day (BID) | OROMUCOSAL | Status: DC
  Administered 2018-10-07 – 2018-10-17 (×21): 15 mL via OROMUCOSAL

## 2018-10-07 MED ORDER — FENTANYL CITRATE (PF) 100 MCG/2ML IJ SOLN: 100.0000 ug | Freq: Once | INTRAMUSCULAR | Status: AC

## 2018-10-07 MED ORDER — INSULIN ASPART 100 UNIT/ML ~~LOC~~ SOLN
2.0000 [IU] | SUBCUTANEOUS | Status: DC
  Administered 2018-10-07 (×2): 2 [IU] via SUBCUTANEOUS
  Administered 2018-10-08: 09:00:00 4 [IU] via SUBCUTANEOUS
  Administered 2018-10-08 – 2018-10-10 (×4): 2 [IU] via SUBCUTANEOUS
  Administered 2018-10-10: 20:00:00 6 [IU] via SUBCUTANEOUS
  Administered 2018-10-10: 2 [IU] via SUBCUTANEOUS
  Administered 2018-10-10: 17:00:00 4 [IU] via SUBCUTANEOUS
  Administered 2018-10-10: 2 [IU] via SUBCUTANEOUS
  Administered 2018-10-10 – 2018-10-11 (×2): 4 [IU] via SUBCUTANEOUS
  Administered 2018-10-11: 05:00:00 6 [IU] via SUBCUTANEOUS
  Administered 2018-10-11 (×2): 4 [IU] via SUBCUTANEOUS
  Administered 2018-10-11: 2 [IU] via SUBCUTANEOUS
  Administered 2018-10-12: 4 [IU] via SUBCUTANEOUS
  Administered 2018-10-12 (×2): 6 [IU] via SUBCUTANEOUS
  Administered 2018-10-12 (×2): 4 [IU] via SUBCUTANEOUS
  Administered 2018-10-12: 6 [IU] via SUBCUTANEOUS
  Administered 2018-10-12: 4 [IU] via SUBCUTANEOUS
  Administered 2018-10-13 (×2): 6 [IU] via SUBCUTANEOUS
  Administered 2018-10-13: 2 [IU] via SUBCUTANEOUS
  Administered 2018-10-13 – 2018-10-14 (×4): 4 [IU] via SUBCUTANEOUS
  Administered 2018-10-14 (×2): 2 [IU] via SUBCUTANEOUS
  Administered 2018-10-14: 4 [IU] via SUBCUTANEOUS
  Administered 2018-10-15 – 2018-10-16 (×2): 2 [IU] via SUBCUTANEOUS
  Administered 2018-10-16: 16:00:00 6 [IU] via SUBCUTANEOUS
  Administered 2018-10-16: 2 [IU] via SUBCUTANEOUS
  Administered 2018-10-16 – 2018-10-18 (×5): 6 [IU] via SUBCUTANEOUS
  Administered 2018-10-18: 4 [IU] via SUBCUTANEOUS
  Administered 2018-10-18: 6 [IU] via SUBCUTANEOUS
  Administered 2018-10-18: 2 [IU] via SUBCUTANEOUS
  Administered 2018-10-18: 4 [IU] via SUBCUTANEOUS
  Administered 2018-10-18: 2 [IU] via SUBCUTANEOUS
  Administered 2018-10-19: 6 [IU] via SUBCUTANEOUS
  Administered 2018-10-19: 2 [IU] via SUBCUTANEOUS
  Administered 2018-10-19 – 2018-10-20 (×2): 4 [IU] via SUBCUTANEOUS

## 2018-10-07 MED ORDER — LACTATED RINGERS IV BOLUS
1000.0000 mL | Freq: Once | INTRAVENOUS | Status: AC
  Administered 2018-10-07: 1000 mL via INTRAVENOUS

## 2018-10-07 MED ORDER — LACTATED RINGERS IV SOLN
INTRAVENOUS | Status: DC
  Administered 2018-10-07: 08:00:00 via INTRAVENOUS

## 2018-10-07 MED ORDER — ORAL CARE MOUTH RINSE
15.0000 mL | OROMUCOSAL | Status: DC
  Administered 2018-10-07 – 2018-10-17 (×96): 15 mL via OROMUCOSAL

## 2018-10-07 MED ORDER — METOCLOPRAMIDE HCL 5 MG/ML IJ SOLN
5.0000 mg | Freq: Once | INTRAMUSCULAR | Status: DC
  Filled 2018-10-07: qty 2

## 2018-10-07 MED ORDER — MIDAZOLAM HCL 2 MG/2ML IJ SOLN
INTRAMUSCULAR | Status: AC
  Administered 2018-10-07: 2 mg
  Filled 2018-10-07: qty 4

## 2018-10-07 MED ORDER — DEXMEDETOMIDINE HCL IN NACL 200 MCG/50ML IV SOLN
0.4000 ug/kg/h | INTRAVENOUS | Status: DC
  Administered 2018-10-07: 0.4 ug/kg/h via INTRAVENOUS
  Administered 2018-10-10: 22:00:00 0.9 ug/kg/h via INTRAVENOUS
  Administered 2018-10-10: 0.801 ug/kg/h via INTRAVENOUS
  Administered 2018-10-10: 10:00:00 0.4 ug/kg/h via INTRAVENOUS
  Administered 2018-10-10: 17:00:00 1 ug/kg/h via INTRAVENOUS
  Administered 2018-10-11: 03:00:00 0.8 ug/kg/h via INTRAVENOUS
  Administered 2018-10-11: 20:00:00 0.4 ug/kg/h via INTRAVENOUS
  Administered 2018-10-11 (×2): 0.8 ug/kg/h via INTRAVENOUS
  Administered 2018-10-12: 0.6 ug/kg/h via INTRAVENOUS
  Administered 2018-10-12: 13:00:00 0.5 ug/kg/h via INTRAVENOUS
  Administered 2018-10-12: 05:00:00 0.4 ug/kg/h via INTRAVENOUS
  Administered 2018-10-13: 0.3 ug/kg/h via INTRAVENOUS
  Administered 2018-10-13: 0.6 ug/kg/h via INTRAVENOUS
  Administered 2018-10-13: 0.3 ug/kg/h via INTRAVENOUS
  Filled 2018-10-07 (×7): qty 50
  Filled 2018-10-07: qty 100
  Filled 2018-10-07 (×9): qty 50

## 2018-10-07 MED ORDER — PANTOPRAZOLE SODIUM 40 MG PO PACK
40.0000 mg | PACK | Freq: Every day | ORAL | Status: DC
  Administered 2018-10-07 – 2018-10-23 (×16): 40 mg
  Filled 2018-10-07 (×17): qty 20

## 2018-10-07 MED ORDER — METOPROLOL TARTRATE 5 MG/5ML IV SOLN
5.0000 mg | INTRAVENOUS | Status: AC | PRN
  Administered 2018-10-07 (×2): 5 mg via INTRAVENOUS
  Filled 2018-10-07 (×2): qty 5

## 2018-10-07 MED ORDER — ROCURONIUM BROMIDE 50 MG/5ML IV SOLN
50.0000 mg | Freq: Once | INTRAVENOUS | Status: AC
  Administered 2018-10-07: 50 mg via INTRAVENOUS

## 2018-10-07 MED ORDER — SODIUM POLYSTYRENE SULFONATE 15 GM/60ML PO SUSP
30.0000 g | Freq: Once | ORAL | Status: AC
  Administered 2018-10-07: 30 g
  Filled 2018-10-07: qty 120

## 2018-10-07 MED ORDER — MIDAZOLAM HCL 2 MG/2ML IJ SOLN: 2.0000 mg | Freq: Once | INTRAMUSCULAR | Status: AC

## 2018-10-07 MED ORDER — ETOMIDATE 2 MG/ML IV SOLN
20.0000 mg | Freq: Once | INTRAVENOUS | Status: AC
  Administered 2018-10-07: 20 mg via INTRAVENOUS

## 2018-10-07 NOTE — Progress Notes (Addendum)
HEMATOLOGY-ONCOLOGY PROGRESS NOTE  SUBJECTIVE: Krystal Short was intubated earlier today due to worsening shortness of breath and restlessness.  Husband is at the bedside today.  Received 1 unit of platelets earlier today for a platelet count of 7000.  REVIEW OF SYSTEMS:   Unable to obtain review of systems secondary to patient condition.  I have reviewed the past medical history, past surgical history, social history and family history with the patient and they are unchanged from previous note.   PHYSICAL EXAMINATION:  Vitals:   10/07/18 1215 10/07/18 1230  BP: 104/67 98/65  Pulse: 92 93  Resp: (!) 27 (!) 30  Temp: (!) 95 F (35 C) (!) 95.4 F (35.2 C)  SpO2: 96% 96%   Filed Weights   10/05/18 2256 10/06/18 0941  Weight: 113 lb (51.3 kg) 122 lb 2.2 oz (55.4 kg)    Intake/Output from previous day: 08/30 0701 - 08/31 0700 In: 2112.3 [I.V.:538.3; NOBSJ:6283; IV Piggyback:100] Out: 275 [Urine:275]  GENERAL: Intubated, sedated LUNGS: Coarse breath sounds in the anterior lung fields HEART: regular rate & rhythm and no murmurs and no lower extremity edema ABDOMEN:abdomen soft, non-tender and normal bowel sounds Musculoskeletal:no cyanosis of digits and no clubbing  NEURO: Sedated  LABORATORY DATA:  I have reviewed the data as listed CMP Latest Ref Rng & Units 10/07/2018 10/06/2018 10/05/2018  Glucose 70 - 99 mg/dL 136(H) 130(H) 134(H)  BUN 6 - 20 mg/dL 76(H) 54(H) 62(H)  Creatinine 0.44 - 1.00 mg/dL 1.75(H) 1.50(H) 1.57(H)  Sodium 135 - 145 mmol/L 139 138 137  Potassium 3.5 - 5.1 mmol/L 5.5(H) 4.2 4.3  Chloride 98 - 111 mmol/L 114(H) 112(H) 112(H)  CO2 22 - 32 mmol/L 15(L) - 16(L)  Calcium 8.9 - 10.3 mg/dL 7.2(L) - 7.6(L)  Total Protein 6.5 - 8.1 g/dL - - 6.0(L)  Total Bilirubin 0.3 - 1.2 mg/dL - - 3.1(H)  Alkaline Phos 38 - 126 U/L - - 191(H)  AST 15 - 41 U/L - - 28  ALT 0 - 44 U/L - - 11    Lab Results  Component Value Date   WBC 20.1 (H) 10/07/2018   HGB 12.3  10/07/2018   HCT 36.6 10/07/2018   MCV 89.9 10/07/2018   PLT 7 (LL) 10/07/2018   NEUTROABS 12.5 (H) 10/04/2018    Dg Chest 2 View  Result Date: 10/05/2018 CLINICAL DATA:  Suspected sepsis EXAM: CHEST - 2 VIEW COMPARISON:  10/02/2018, 10/01/2018, 09/19/2018, 05/28/2018, 06/03/2017, 03/11/2013, 10/15/2017, 07/12/2018 FINDINGS: Small right-sided pleural effusion. Underlying reticular interstitial changes suggesting component of chronic disease, however there is interim worsening of bilateral right greater than left airspace disease, suspicious for acute edema or pneumonia superimposed on chronic underlying disease. Stable cardiomediastinal silhouette. No pneumothorax. IMPRESSION: 1. Interval worsening of bilateral right greater than left airspace disease, suspicious for acute edema or pneumonia superimposed on underlying chronic disease. Small right-sided pleural effusion. Electronically Signed   By: Donavan Foil M.D.   On: 10/05/2018 23:52   Dg Chest 2 View  Result Date: 09/19/2018 CLINICAL DATA:  Hypotensive, recent pneumonia EXAM: CHEST - 2 VIEW COMPARISON:  Chest radiograph 08/26/2018 FINDINGS: Heart size within normal limits. Ill-defined and subtly nodular pulmonary opacities are again demonstrated, more prominent within the right lung. Findings are similar to prior chest radiograph 09/03/2018 and suspicious for infection. No evidence of pneumothorax or pleural effusion. No acute bony abnormality. IMPRESSION: Ill-defined and subtly nodular pulmonary opacities are similar to prior exam 08/26/2018 and suspicious for infection, suspected atypical pneumonia. Electronically Signed  By: Kellie Simmering   On: 09/19/2018 13:33   Dg Abd 1 View  Result Date: 10/07/2018 CLINICAL DATA:  Abdominal distention. OG tube placement. EXAM: ABDOMEN - 1 VIEW COMPARISON:  CT scan of the abdomen dated 08/02/2018 FINDINGS: There is gaseous distention of the stomach. The tip of the OG tube lies in the distal stomach. No  dilated large or small bowel. Hepatosplenomegaly. Infiltrates at the lung bases. Bones are normal. IMPRESSION: OG tube tip lies in the distal stomach. Gaseous distention of the stomach. Electronically Signed   By: Lorriane Shire M.D.   On: 10/07/2018 09:58   Ct Biopsy  Result Date: 09/11/2018 CLINICAL DATA:  HIV positive. Elevated white blood cell count, anemia, thrombocytopenia EXAM: CT GUIDED DEEP ILIAC BONE ASPIRATION AND CORE BIOPSY TECHNIQUE: Patient was placed prone on the CT gantry and limited axial scans through the pelvis were obtained. Appropriate skin entry site was identified. Skin site was marked, prepped with chlorhexidine, draped in usual sterile fashion, and infiltrated locally with 1% lidocaine. Intravenous Fentanyl 135mg and Versed 221mwere administered as conscious sedation during continuous monitoring of the patient's level of consciousness and physiological / cardiorespiratory status by the radiology RN, with a total moderate sedation time of 12 minutes. Under CT fluoroscopic guidance an 11-gauge Cook trocar bone needle was advanced into the right iliac bone just lateral to the sacroiliac joint. Once needle tip position was confirmed, core and aspiration samples were obtained, submitted to pathology for approval. A second core was obtained for cultures as requested. Patient tolerated procedure well. COMPLICATIONS: COMPLICATIONS none IMPRESSION: 1. Technically successful CT guided right iliac bone core and aspiration biopsy. Electronically Signed   By: D Lucrezia Europe.D.   On: 09/11/2018 12:40   Portable Chest X-ray  Result Date: 10/07/2018 CLINICAL DATA:  Intubation EXAM: PORTABLE CHEST 1 VIEW COMPARISON:  October 06, 2018 FINDINGS: ET tube is 3 cm above the level of carina. Enteric tube is seen coursing below the diaphragm. A right-sided PICC is seen at the superior cavoatrial junction. Patchy airspace opacities are seen throughout both lungs, right greater than left. The cardiomediastinal  silhouette is unchanged. IMPRESSION: ET tube 3 cm above the carina. Unchanged patchy airspace opacities, right greater than left. Electronically Signed   By: BiPrudencio Pair.D.   On: 10/07/2018 10:05   Dg Chest Port 1 View  Result Date: 10/06/2018 CLINICAL DATA:  [age]ear old female with centralized placement. EXAM: PORTABLE CHEST 1 VIEW COMPARISON:  Chest radiograph dated 10/05/2018 FINDINGS: Right IJ central venous line with tip over the cavoatrial junction. No pneumothorax. Bilateral airspace opacities similar to prior radiograph. There is a small right pleural effusion. No acute osseous pathology. IMPRESSION: 1. Interval placement of a right IJ central venous line with tip in the region of the cavoatrial junction. No pneumothorax. 2. No significant interval change in the bilateral airspace opacities. Electronically Signed   By: ArAnner Crete.D.   On: 10/06/2018 04:04   Dg Chest Port 1 View  Result Date: 10/02/2018 CLINICAL DATA:  Tachypnea EXAM: PORTABLE CHEST 1 VIEW COMPARISON:  Yesterday FINDINGS: Generalized reticular pulmonary opacity, present on multiple prior studies. No pleural fluid or pneumothorax. Normal heart size. IMPRESSION: Chronic reticular pulmonary opacity without acute superimposed finding. Electronically Signed   By: JoMonte Fantasia.D.   On: 10/02/2018 05:45   Dg Chest Port 1 View  Result Date: 10/01/2018 CLINICAL DATA:  Cough, pancytopenia EXAM: PORTABLE CHEST 1 VIEW COMPARISON:  09/19/2018 FINDINGS: Stable cardiomediastinal contours. Extensive, diffuse reticulonodular  opacities throughout both lungs, markedly progressed from prior. No pleural effusion. No pneumothorax. Osseous structures intact. IMPRESSION: Extensive reticulonodular opacities throughout both lungs suggestive of an atypical infection in an immunocompromised patient. Electronically Signed   By: Davina Poke M.D.   On: 10/01/2018 18:38   Ct Bone Marrow Biopsy & Aspiration  Result Date:  09/11/2018 CLINICAL DATA:  HIV positive. Elevated white blood cell count, anemia, thrombocytopenia EXAM: CT GUIDED DEEP ILIAC BONE ASPIRATION AND CORE BIOPSY TECHNIQUE: Patient was placed prone on the CT gantry and limited axial scans through the pelvis were obtained. Appropriate skin entry site was identified. Skin site was marked, prepped with chlorhexidine, draped in usual sterile fashion, and infiltrated locally with 1% lidocaine. Intravenous Fentanyl 116mg and Versed 253mwere administered as conscious sedation during continuous monitoring of the patient's level of consciousness and physiological / cardiorespiratory status by the radiology RN, with a total moderate sedation time of 12 minutes. Under CT fluoroscopic guidance an 11-gauge Cook trocar bone needle was advanced into the right iliac bone just lateral to the sacroiliac joint. Once needle tip position was confirmed, core and aspiration samples were obtained, submitted to pathology for approval. A second core was obtained for cultures as requested. Patient tolerated procedure well. COMPLICATIONS: COMPLICATIONS none IMPRESSION: 1. Technically successful CT guided right iliac bone core and aspiration biopsy. Electronically Signed   By: D Lucrezia Europe.D.   On: 09/11/2018 12:40   Vas [place] Extremity Venous (dvt)  Result Date: 10/07/2018  Lower Venous Study Indications: SOB, and Edema.  Risk Factors: Advanced HIV/AIDS. Limitations: Lights on in room, patient newly vented. Comparison Study: Prior study from 06/20/17 is available for comparison Performing Technologist: CaSharion DoveVS  Examination Guidelines: A complete evaluation includes B-mode imaging, spectral Doppler, color Doppler, and power Doppler as needed of all accessible portions of each vessel. Bilateral testing is considered an integral part of a complete examination. Limited examinations for reoccurring indications may be performed as noted.   +---------+---------------+---------+-----------+----------+--------------+  RIGHT     Compressibility Phasicity Spontaneity Properties Thrombus Aging  +---------+---------------+---------+-----------+----------+--------------+  CFV       Full            Yes       Yes                                    +---------+---------------+---------+-----------+----------+--------------+  SFJ       Full                                                             +---------+---------------+---------+-----------+----------+--------------+  FV Prox   Full                                                             +---------+---------------+---------+-----------+----------+--------------+  FV Mid    Full                                                             +---------+---------------+---------+-----------+----------+--------------+  FV Distal Full                                                             +---------+---------------+---------+-----------+----------+--------------+  PFV       Full                                                             +---------+---------------+---------+-----------+----------+--------------+  POP       Full            Yes       Yes                                    +---------+---------------+---------+-----------+----------+--------------+  PTV       Full                                                             +---------+---------------+---------+-----------+----------+--------------+  PERO      Full                                                             +---------+---------------+---------+-----------+----------+--------------+   +---------+---------------+---------+-----------+----------+--------------+  LEFT      Compressibility Phasicity Spontaneity Properties Thrombus Aging  +---------+---------------+---------+-----------+----------+--------------+  CFV       Full            Yes       Yes                                     +---------+---------------+---------+-----------+----------+--------------+  SFJ       Full                                                             +---------+---------------+---------+-----------+----------+--------------+  FV Prox   Full                                                             +---------+---------------+---------+-----------+----------+--------------+  FV Mid    Full                                                             +---------+---------------+---------+-----------+----------+--------------+  FV Distal Full                                                             +---------+---------------+---------+-----------+----------+--------------+  PFV       Full                                                             +---------+---------------+---------+-----------+----------+--------------+  POP       Full            Yes       Yes                                    +---------+---------------+---------+-----------+----------+--------------+  PTV       Full                                                             +---------+---------------+---------+-----------+----------+--------------+  PERO                                                       Not visualized  +---------+---------------+---------+-----------+----------+--------------+     Summary: Right: Findings appear essentially unchanged compared to previous examination. There is no evidence of deep vein thrombosis in the lower extremity. Left: Findings appear essentially unchanged compared to previous examination. There is no evidence of deep vein thrombosis in the lower extremity. However, portions of this examination were limited- see technologist comments above.  *See table(s) above for measurements and observations.    Preliminary    09/11/2018 Bone Marrow Biopsy   09/11/2018 [employer]   ASSESSMENT AND PLAN: 1. Anemia 2. Thrombocytopenia 3.  Leukocytosis 4.  PCP recently, recurrent sepsis, etiology of  pulmonary infiltrates unclear 5.  HIV/AIDS on Biktarvy. On Atovaquone for PCP prophylaxis and Azithromycin for MAI prophylaxis. 6. Splenomegaly noted on prior abdominal ultrasound 7.  Acute respiratory failure 8.  Recurrent bilateral pulmonary infiltrates  -HIV/AIDS is likely influencing her blood counts. -Recommend continued supportive care.  Transfuse packed red blood cells for hemoglobin less than 7.0 and transfuse platelets for platelet count less than 10,000 or active bleeding. -Continue Nplate. -Recommend consultation by infectious disease for further management of her HIV/AIDS.   LOS: 1 day   Mikey Bussing, DNP, AGPCNP-BC, AOCNP 10/07/18  ADDENDUM  .Patient was Personally and independently interviewed, examined and relevant elements of the history of present illness were reviewed in details and an assessment and plan was created. All elements of the patient's history of present illness , assessment and plan were discussed in details with Mikey Bussing, DNP. The above documentation reflects our combined findings assessment and plan.  Patient with pancytopenia with extensive workup including 2 BM Bx's .  Patients blood counts havent had a chance for sustained improvement on account of recurrent sepsis, medication causing BM suppression and cytopenia, HIV/AIDS related dysplastic changes on last BM Bx. Currently critically illness with acute respiratory failure on vent, AKI and electrolyte abnormalities and recurrent b/l pulmonary infiltrates of unknown etiology. On empiric rx for PJP per ID. Reasonable to consider transfer to tertiary care facility if consistent with family's wishes and goals of care. Supportive transfusions for hgb<7.5 and PLT<20k in the setting of sepsis.   Sullivan Lone MD MS

## 2018-10-07 NOTE — Progress Notes (Signed)
Pt having increased restlessness, HR 190's, IV Lopressor given, IV Valium, CN Christian RN at bedside and CCM notified, Bipap  O2 increased to 80%

## 2018-10-07 NOTE — Progress Notes (Addendum)
eLink Physician-Brief Progress Note Patient Name: Shaquona Cady DOB: [birth year] MRN: [id number]   Date of Service  10/07/2018  HPI/Events of Note  Notified by bedside ICU RN regarding labored breathing.  Pt is a 33/F with advanced HIV, in respiratory failure.  She appears tachypneic.  She is not on any sedation.   eICU Interventions  Advised RN to start precedex gtt.  Recheck ABG in 1 hour.  Hold off restraints as discussed.     Intervention Category Intermediate Interventions: Respiratory distress - evaluation and management  Elsie Lincoln 10/07/2018, 10:43 PM   1:16 AM ABG 7.132/41/59.6. O2 sats 95%. Pt remains tachypneic with RR in the 20s.  Plan> Check lactic acid.  Increase tidal volume to 420.  Give versed 2mg  IV. Continue precedex gtt and fentanyl prn.  3:59 AM ABG 7.118/43.5/97.7 Plan> Continue on current settings.  Continue with sedation.  5:11 AM AM labs reveal worsening renal function, hyperkalemia with K of 7.  Plan> Give calcium gluconate, insulin and D50 and kayexalate. Check EKG.  Consult Renal in the AM.  Repeat K is being ordered.  5:53 AM Repeat ABG shows worsening metabolic acidosis.  Plan> Start on NaHCO3 gtt.    6:22 AM Pt temp 100.9.   Plan> Tylenol ordered.   LR discontinued as patient is on NaHCO3 gtt.

## 2018-10-07 NOTE — Procedures (Signed)
Intubation Procedure Note Krystal Short [id number] 1985/11/01  Procedure: Intubation Indications: Airway protection and maintenance  Procedure Details Consent: Risks of procedure as well as the alternatives and risks of each were explained to the (patient/caregiver).  Consent for procedure obtained. Time Out: Verified patient identification, verified procedure, site/side was marked, verified correct patient position, special equipment/implants available, medications/allergies/relevent history reviewed, required imaging and test results available.  Performed  Maximum sterile technique was used including antiseptics, gloves, hand hygiene and mask.  MAC    Evaluation Hemodynamic Status: Transient hypotension treated with pressors; O2 sats: stable throughout Patient's Current Condition: stable Complications: No apparent complications Patient did tolerate procedure well. Chest X-ray ordered to verify placement.  CXR: pending.   Jennet Maduro 10/07/2018

## 2018-10-07 NOTE — Procedures (Signed)
Bronchoscopy Procedure Note Hanaan Oberg [id number] December 30, 1985  Procedure: Bronchoscopy Indications: Diagnostic evaluation of the airways, Obtain specimens for culture and/or other diagnostic studies and Remove secretions  Procedure Details Consent: Unable to obtain consent because of emergent medical necessity. Time Out: Verified patient identification, verified procedure, site/side was marked, verified correct patient position, special equipment/implants available, medications/allergies/relevent history reviewed, required imaging and test results available.  Performed  In preparation for procedure, patient was given 100% FiO2 and bronchoscope lubricated. Sedation: Benzodiazepines, Fentanyl, etomidate and roc  Airway entered and the following bronchi were examined: RUL, RML, RLL, LUL, LLL and Bronchi.   Diffusely blood secretions noted Bronchoscope removed.  , Patient placed back on 100% FiO2 at conclusion of procedure.    Evaluation Hemodynamic Status: BP stable throughout; O2 sats: stable throughout Patient's Current Condition: stable Specimens:  Sent serosanguinous fluid Complications: No apparent complications Patient did tolerate procedure well.   Jennet Maduro 10/07/2018

## 2018-10-07 NOTE — Progress Notes (Signed)
Patient's husband, Marya Amsler, notified about increasing SOB, WOB, and restlessness as well as possible intubation. All questions answered at this time. Will continue to update.

## 2018-10-07 NOTE — Progress Notes (Signed)
VASCULAR LAB PRELIMINARY  PRELIMINARY  PRELIMINARY  PRELIMINARY  Bilateral lower extremity venous duplex completed.    Preliminary report:  See CV proc for preliminary results  Christee Mervine, RVT 10/07/2018, 9:50 AM

## 2018-10-07 NOTE — Progress Notes (Signed)
[facility] for Infectious Disease    Date of Admission:  10/05/2018   Total days of antibiotics 6          ID: Krystal Short is a 33 y.o. female with advanced hiv disease, cd 4 count of 48/VL<20 (July 2020) re-admitted after day of discharge for FUO/pneumonia for respiratory distress Active Problems:   AIDS (acquired immune deficiency syndrome) ([place])   Suspected pneumocystis pneumonia ([place])   Thrombocytopenia ([place])   PCP (pneumocystis jiroveci pneumonia) ([place])    Subjective: Intubated this morning. Maintained on 70%FiO2 and vasopressors  Medications:   sodium chloride   Intravenous Once   atovaquone  1,500 mg Oral Daily   [START ON 10/08/2018] azithromycin  1,200 mg Oral Q Tue   benzonatate  100 mg Oral TID   bictegravir-emtricitabine-tenofovir AF  1 tablet Oral Daily   chlorhexidine  15 mL Mouth Rinse BID   chlorhexidine gluconate (MEDLINE KIT)  15 mL Mouth Rinse BID   Chlorhexidine Gluconate Cloth  6 each Topical Daily   dextromethorphan-guaiFENesin  1 tablet Oral BID   insulin aspart  2-6 Units Subcutaneous Q4H   mouth rinse  15 mL Mouth Rinse 10 times per day   methylPREDNISolone (SOLU-MEDROL) injection  80 mg Intravenous Q12H   metoCLOPramide (REGLAN) injection  5 mg Intravenous Once   mometasone-formoterol  2 puff Inhalation BID   pantoprazole sodium  40 mg Per Tube Daily   sodium chloride flush  10-40 mL Intracatheter Q12H    Objective: Vital signs in last 24 hours: Temp:  [94.4 F (34.7 C)-99.7 F (37.6 C)] 99.3 F (37.4 C) (08/31 1815) Pulse Rate:  [87-126] 120 (08/31 1815) Resp:  [14-33] 24 (08/31 1815) BP: (71-167)/(35-132) 115/71 (08/31 1815) SpO2:  [78 %-97 %] 96 % (08/31 1815) FiO2 (%):  [35 %-100 %] 70 % (08/31 1532) Physical Exam  Constitutional:  sedated. appears chronically ill and under-nourished. No distress. moluscum like lesions on face HENT: [place]/AT, no scleral icterus Mouth/Throat: OETT in place. oropharynx is clear and  moist. No oropharyngeal exudate. Dry crusted blood on tongue Cardiovascular: tachycardic, regular rhythm and normal heart sounds. Exam reveals no gallop and no friction rub.  No murmur heard.  Pulmonary/Chest: Effort normal and breath sounds normal. No respiratory distress.  has no wheezes.  Neck = right ij in place Abdominal:  Bowel sounds are decreased. +distension. There is no tenderness.  Neurological: alert and oriented to person, place, and time.  Skin: +raised papules with central umbilication/?moluscum?  Psychiatric: a normal mood and affect.  behavior is normal.    Lab Results Recent Labs    10/06/18 1153 10/06/18 2000 10/07/18 0350 10/07/18 1209  WBC 21.2*  --  20.1*  --   HGB 6.8* 12.0 12.3  --   HCT 21.5* 36.0 36.6  --   NA  --   --  139 139  K  --   --  5.5* 6.5*  CL  --   --  114* 113*  CO2  --   --  15* 16*  BUN  --   --  76* 79*  CREATININE  --   --  1.75* 1.92*   Liver Panel Recent Labs    10/05/18 0120 10/07/18 1209  PROT 6.0* 6.0*  ALBUMIN 1.6* 2.0*  AST 28 119*  ALT 11 28  ALKPHOS 191* 187*  BILITOT 3.1* 3.5*   Lab Results  Component Value Date   ESRSEDRATE 100 (H) 10/02/2018   Lab Results  Component  Value Date   CRP 23.8 (H) 10/03/2018    Microbiology: Reviewed cx from 5/29 to the present. afb blood cx/ and bone marrow cx negative growth since late July/early august Blood cx ngtd\  cmv 2300  Studies/Results: Dg Chest 2 View  Result Date: 10/05/2018 CLINICAL DATA:  Suspected sepsis EXAM: CHEST - 2 VIEW COMPARISON:  10/02/2018, 10/01/2018, 09/19/2018, 05/28/2018, 06/03/2017, 03/11/2013, 10/15/2017, 07/12/2018 FINDINGS: Small right-sided pleural effusion. Underlying reticular interstitial changes suggesting component of chronic disease, however there is interim worsening of bilateral right greater than left airspace disease, suspicious for acute edema or pneumonia superimposed on chronic underlying disease. Stable cardiomediastinal  silhouette. No pneumothorax. IMPRESSION: 1. Interval worsening of bilateral right greater than left airspace disease, suspicious for acute edema or pneumonia superimposed on underlying chronic disease. Small right-sided pleural effusion. Electronically Signed   By: Donavan Foil M.D.   On: 10/05/2018 23:52   Dg Abd 1 View  Result Date: 10/07/2018 CLINICAL DATA:  Abdominal distention. OG tube placement. EXAM: ABDOMEN - 1 VIEW COMPARISON:  CT scan of the abdomen dated 08/02/2018 FINDINGS: There is gaseous distention of the stomach. The tip of the OG tube lies in the distal stomach. No dilated large or small bowel. Hepatosplenomegaly. Infiltrates at the lung bases. Bones are normal. IMPRESSION: OG tube tip lies in the distal stomach. Gaseous distention of the stomach. Electronically Signed   By: Lorriane Shire M.D.   On: 10/07/2018 09:58   Ct Head Wo Contrast  Result Date: 10/07/2018 CLINICAL DATA:  Altered mental status, intubation, HIV EXAM: CT HEAD WITHOUT CONTRAST TECHNIQUE: Contiguous axial images were obtained from the base of the skull through the vertex without intravenous contrast. COMPARISON:  None. FINDINGS: Brain: No evidence of acute infarction, hemorrhage, hydrocephalus, extra-axial collection or mass lesion/mass effect. Vascular: No hyperdense vessel or unexpected calcification. Skull: Normal. Negative for fracture or focal lesion. Sinuses/Orbits: No acute finding. Other: None. IMPRESSION: No acute intracranial pathology. Electronically Signed   By: Eddie Candle M.D.   On: 10/07/2018 16:28   Portable Chest X-ray  Result Date: 10/07/2018 CLINICAL DATA:  Intubation EXAM: PORTABLE CHEST 1 VIEW COMPARISON:  October 06, 2018 FINDINGS: ET tube is 3 cm above the level of carina. Enteric tube is seen coursing below the diaphragm. A right-sided PICC is seen at the superior cavoatrial junction. Patchy airspace opacities are seen throughout both lungs, right greater than left. The cardiomediastinal  silhouette is unchanged. IMPRESSION: ET tube 3 cm above the carina. Unchanged patchy airspace opacities, right greater than left. Electronically Signed   By: Prudencio Pair M.D.   On: 10/07/2018 10:05   Dg Chest Port 1 View  Result Date: 10/06/2018 CLINICAL DATA:  33 year old female with centralized placement. EXAM: PORTABLE CHEST 1 VIEW COMPARISON:  Chest radiograph dated 10/05/2018 FINDINGS: Right IJ central venous line with tip over the cavoatrial junction. No pneumothorax. Bilateral airspace opacities similar to prior radiograph. There is a small right pleural effusion. No acute osseous pathology. IMPRESSION: 1. Interval placement of a right IJ central venous line with tip in the region of the cavoatrial junction. No pneumothorax. 2. No significant interval change in the bilateral airspace opacities. Electronically Signed   By: Anner Crete M.D.   On: 10/06/2018 04:04   Vas [place] Lower Extremity Venous (dvt)  Result Date: 10/07/2018  Lower Venous Study Indications: SOB, and Edema.  Risk Factors: Advanced HIV/AIDS. Limitations: Lights on in room, patient newly vented. Comparison Study: Prior study from 06/20/17 is available for comparison Performing Technologist: Candace  Mauro Kaufmann RVS  Examination Guidelines: A complete evaluation includes B-mode imaging, spectral Doppler, color Doppler, and power Doppler as needed of all accessible portions of each vessel. Bilateral testing is considered an integral part of a complete examination. Limited examinations for reoccurring indications may be performed as noted.  +---------+---------------+---------+-----------+----------+--------------+  RIGHT     Compressibility Phasicity Spontaneity Properties Thrombus Aging  +---------+---------------+---------+-----------+----------+--------------+  CFV       Full            Yes       Yes                                    +---------+---------------+---------+-----------+----------+--------------+  SFJ       Full                                                              +---------+---------------+---------+-----------+----------+--------------+  FV Prox   Full                                                             +---------+---------------+---------+-----------+----------+--------------+  FV Mid    Full                                                             +---------+---------------+---------+-----------+----------+--------------+  FV Distal Full                                                             +---------+---------------+---------+-----------+----------+--------------+  PFV       Full                                                             +---------+---------------+---------+-----------+----------+--------------+  POP       Full            Yes       Yes                                    +---------+---------------+---------+-----------+----------+--------------+  PTV       Full                                                             +---------+---------------+---------+-----------+----------+--------------+  PERO      Full                                                             +---------+---------------+---------+-----------+----------+--------------+   +---------+---------------+---------+-----------+----------+--------------+  LEFT      Compressibility Phasicity Spontaneity Properties Thrombus Aging  +---------+---------------+---------+-----------+----------+--------------+  CFV       Full            Yes       Yes                                    +---------+---------------+---------+-----------+----------+--------------+  SFJ       Full                                                             +---------+---------------+---------+-----------+----------+--------------+  FV Prox   Full                                                             +---------+---------------+---------+-----------+----------+--------------+  FV Mid    Full                                                              +---------+---------------+---------+-----------+----------+--------------+  FV Distal Full                                                             +---------+---------------+---------+-----------+----------+--------------+  PFV       Full                                                             +---------+---------------+---------+-----------+----------+--------------+  POP       Full            Yes       Yes                                    +---------+---------------+---------+-----------+----------+--------------+  PTV       Full                                                             +---------+---------------+---------+-----------+----------+--------------+  PERO                                                       Not visualized  +---------+---------------+---------+-----------+----------+--------------+     Summary: Right: Findings appear essentially unchanged compared to previous examination. There is no evidence of deep vein thrombosis in the lower extremity. Left: Findings appear essentially unchanged compared to previous examination. There is no evidence of deep vein thrombosis in the lower extremity. However, portions of this examination were limited- see technologist comments above.  *See table(s) above for measurements and observations. Electronically signed by Monica Martinez MD on 10/07/2018 at 6:03:03 PM.    Final      Assessment/Plan: hyperferritinemia = could be related to RAS activation/hemophagocytic lymphohistiocytosis Will check other markers.(currently sed rate/crp/ferritin/ldh c/w with [place]). Will check EBV VL and CMV VL to see if possibily the nidus. What goes against this is that she had BM aspirate earlier in the month that did not show any histiocytes- nut not always seen. Continue with steroids.   Anemia = will check parvo b19 as possible cause.  Respiratory distress = currently on proph with atovaquone. Would have low threshold to switch to treatment with bactrim  IV. Pneumocystis is pending  HIV disease = continue on current regimen of bitkarvy/ may need to change to tivicay plus descovy - which can crushed for tube.   [facility] for Infectious Diseases Cell: [phone] Pager: [phone]  10/07/2018, 6:29 PM

## 2018-10-07 NOTE — Progress Notes (Signed)
[facility] PROGRESS NOTE  Patient: Krystal Short [id number]   PCP: Lajean Manes, MD DOB: January 25, 1986   DOA: 10/05/2018   DOS: 10/07/2018    Assessment and plan: In my initial assessment patient was drowsy and lethargic.  She was still struggling to breathe despite being on BiPAP.  CCM was at bedside. Decision was made to intubate the patient and transfer her to ICU. Patient's care was transferred to the ICU. Appreciate their assistance. I verified that hematology and infectious disease services are consulting on the patient CT scan of the head negative for any intracranial bleed.  Author: Berle Mull, MD [facility] 10/07/2018 5:32 PM   If 7PM-7AM, please contact night-coverage at [email or web address]

## 2018-10-07 NOTE — Progress Notes (Signed)
CRITICAL VALUE ALERT  Critical Value: Platelets at 7000  Date & Time Notied:  10/07/2018 0430am  Provider Notified: [facility] paged   Orders Received/Actions taken: Waiting for orders; RN will continue to monitor

## 2018-10-07 NOTE — Progress Notes (Signed)
Pt has an order for a CT scan of her head and the pt was to unstable to go. CT scan will be scheduled after the pt is stable

## 2018-10-07 NOTE — Progress Notes (Signed)
PULMONARY / CRITICAL CARE MEDICINE   NAME:  Krystal Short, MRN:  [id number], DOB:  12-25-1985, LOS: 1 ADMISSION DATE:  10/05/2018, CONSULTATION DATE:  8/30 REFERRING MD:  [place]/ [facility], CHIEF COMPLAINT:  sob  BRIEF HISTORY:    85 yobf  Quit smoking 12/2016 s resp problems then  but dx with HIV April 2019 presented with pna and breathing "never right since"  With recurrent pattern of pulmonary infiltrates presumed PCP responsive to rx by ID which includes steroids with cxr almost nl on 09/03/18 and 09/19/18 cxrs  also being used for severe thrombocytopenia and back to ER pm 8/29 with same pattern of progressive sob x 3-4 days PTA with min dry cough  assoc with recurrent diffuse pulmonary infiltrates 1st noted 10/01/18  so placed on 02/bipap and PCCM service consulted      HISTORY OF PRESENT ILLNESS   > 06/14/2018-07/16/2018 Bordetella bronchus infection.  Underwent FOB 07/12/2018 s PCP studies  Biopsy scant lung parenchyma with fibrosis and reactive changes. Required 4 PRBC and 3 platelet infusion. >07/31/2026-08/22/2018 sepsis from suspected PJP pneumonia.  Treated with steroids clindamycin and primaquine. Required 8 PRBC and 7 platelet transfusion.  RVP positive for routine coronavirus. > 09/03/2018-09/13/2018 generalized weakness and deconditioning.  Required 3 PRBC 8 platelet transfusion. IVIG on 09/05/2018 without any improvement in platelet count.  Given prolonged prednisone taper. 09/11/18 BM Bx all micro studies neg  > 10/01/2018-10/04/2018 presented with anemia and poor IV access.  Given 2 PRBC 1 platelet transfusion.  Required pressors.  Treated with ceftriaxone and doxy for ?  CAP.  No obvious patterns in day to day or daytime variability or assoc excess/ purulent sputum or mucus plugs or hemoptysis or cp or chest tightness, subjective wheeze or overt sinus or hb symptoms.    . Also denies any obvious fluctuation of symptoms with weather or environmental changes or other aggravating or alleviating  factors except as outlined above   No unusual exposure hx or h/o childhood pna/ asthma or knowledge of premature birth.  Current Allergies, Complete Past Medical History, Past Surgical History, Family History, and Social History were reviewed in [employer] record.  ROS  The following are not active complaints unless bolded Hoarseness, sore throat, dysphagia, dental problems, itching, sneezing,  nasal congestion or discharge of excess mucus or purulent secretions, ear ache,   fever, chills, sweats, unintended wt loss or wt gain, classically pleuritic or exertional cp,  orthopnea pnd or arm/hand swelling  or leg swelling, presyncope, palpitations, abdominal pain, anorexia, nausea, vomiting, diarrhea  or change in bowel habits or change in bladder habits, change in stools or change in urine, dysuria, hematuria,  rash, arthralgias, visual complaints, headache, numbness, weakness or ataxia or problems with walking or coordination,  change in mood or  memory.             SIGNIFICANT EVENTS:   started solumedrol 8/30 am @ 80 mg q 12h  STUDIES:     CULTURES:  COVID 19 pcr 8/29 neg BC x 2  8/29 >>> PCP by dfa sputum 8/30 >>>  ANTIBIOTICS per ID as of 8/30:   Bactrim 8/29 >>>  Maxepime 8/29 >>>  LINES/TUBES:  R IJ CVL in ER  8/30 >>   CONSULTANTS:  PCCM  8/30 ID    8/30  SUBJECTIVE:  Unresponsive this AM, not protecting her airway on BiPAP  CONSTITUTIONAL: BP (!) 102/46   Pulse 93   Temp (!) 96.2 F (35.7 C) (Rectal) Comment: Retail banker  placed on pt  Resp 18   Ht _0  (1.549 m)   Wt 55.4 kg   LMP  (LMP Unknown) Comment: pt is on depo  SpO2 97%   BMI 23.08 kg/m   I/O last 3 completed shifts: In: 2112.3 [I.V.:538.3; UUEKC:0034; IV Piggyback:100] Out: 275 [Urine:275]   Vent Mode: PRVC FiO2 (%):  [35 %-100 %] 100 % Set Rate:  [18 bmp] 18 bmp Vt Set:  [380 mL] 380 mL PEEP:  [10 cmH20] 10 cmH20 Plateau Pressure:  [41 cmH20] 41 cmH20  PHYSICAL  EXAM: General:  Acute on chronically ill appearing female Neuro:  Unresponsive but withdraws to command HEENT:  [place]/AT, PERRL, EOM-spontaneous, ETT in place now Cardiovascular:  RRR, Nl S1/S2 and -M/R/G Lungs:  Coarse diffusely Abdomen:  Soft, NT, ND and +BS Musculoskeletal:  No calf tenderness Skin:  No skin breakdown  I reviewed CXR myself, infiltrate noted and ETT is in a good position  pCXR:   8/30 1. Interval placement of a right IJ central venous line with tip in the region of the cavoatrial junction. No pneumothorax. 2. No significant interval change in the bilateral airspace Opacities. My review: there has been a marked change with new GG changes that occurred between 8/13 and 10/01/18    BNP   10/02/18  =  276 LDH    10/02/18  = 352   Vs 255 10/01/2018   Lab Results  Component Value Date   ESRSEDRATE 100 (H) 10/02/2018   ESRSEDRATE >140 (H) 08/05/2018   ESRSEDRATE 140 (H) 08/02/2018    ASSESSMENT AND PLAN    1)  Acute / recurrent hypoxemic Resp failure in setting of diffuse recurrent pulmlnary infiltrates - see below - note marked hyperventilation (primary alkalosis) with nl hc03 response (secondary met acidosis with nl AG - actually due to low albumin) - Intubate - Full vent support - Adjust vent for ABG - Titrate O2 for sat of 88-92%  2) recurrent bilateral pulmonary infiltrates flared with pred taper despite ? max maint rx for PCP  -  The high ldl, absence of typical crackles are c/w pcp but can't r/o boop or alv hem caused by extremely low plt - BAL done post intubation - Abx as ordered - F/U on cultures  3) Profound persistent thrombocytopenia despite "nl bone marrow" suggests consumption issue > hematology following ? Steroid responsive (if so just setting her up longer term for more opportunistic organisms - Would consider calling H/O but will defer to primary at this point  4) Symptomatic anemia with nl BM hematopoesis suggests peripheral consumption - no  evidence of alv hem so far.   5) Mild renal insuff ? Etiology? Partly related to vol depletion vs drug side effects, no evidence of a pulmonary/renal syndrome    - Hydrate - BMET in AM - Replace electrolytes - IVF resuscitation  6) Circulatory shock: septic vs sedation related - Levophed - IVF - Follow CVP  PCCM will continue to follow for vent management.   LABS  Glucose No results for input(s): GLUCAP in the last 168 hours.  BMET Recent Labs  Lab 10/04/18 0514 10/05/18 0120 10/06/18 0300 10/07/18 0350  NA 134* 137 138 139  K 4.1 4.3 4.2 5.5*  CL 110 112* 112* 114*  CO2 16* 16*  --  15*  BUN 43* 62* 54* 76*  CREATININE 1.05* 1.57* 1.50* 1.75*  GLUCOSE 111* 134* 130* 136*   Liver Enzymes Recent Labs  Lab 10/03/18 0210 10/04/18 0514 10/05/18  0120  AST _0 ALT _1 ALKPHOS 146* 125 191*  BILITOT 2.3* 1.6* 3.1*  ALBUMIN 1.7* 1.7* 1.6*   Electrolytes Recent Labs  Lab 10/03/18 0210 10/04/18 0514 10/05/18 0120 10/07/18 0350  CALCIUM 7.4* 7.5* 7.6* 7.2*  MG 1.4* 2.3  --   --    CBC Recent Labs  Lab 10/06/18 0136  10/06/18 1153 10/06/18 2000 10/07/18 0350  WBC 20.9*  --  21.2*  --  20.1*  HGB 7.9*   < > 6.8* 12.0 12.3  HCT 24.6*   < > 21.5* 36.0 36.6  PLT 9*  --  17*  --  7*   < > = values in this interval not displayed.   ABG Recent Labs  Lab 10/05/18 2350  PHART 7.440  PCO2ART 19.6*  PO2ART 132*   Coag's Recent Labs  Lab 10/01/18 1832 10/05/18 0120  APTT 43*  --   INR 1.3* 1.4*   Sepsis Markers Recent Labs  Lab 10/02/18 1112  10/03/18 0556 10/05/18 2313 10/06/18 0113  LATICACIDVEN  --    < > 3.1* 2.6* 1.2  PROCALCITON 12.26  --   --   --   --    < > = values in this interval not displayed.   Cardiac Enzymes No results for input(s): TROPONINI, PROBNP in the last 168 hours.  The patient is critically ill with multiple organ systems failure and requires high complexity decision making for assessment and support,  frequent evaluation and titration of therapies, application of advanced monitoring technologies and extensive interpretation of multiple databases.   Critical Care Time devoted to patient care services described in this note is  45  Minutes. This time reflects time of care of this signee Dr Jennet Maduro. This critical care time does not reflect procedure time, or teaching time or supervisory time of PA/NP/Med student/Med Resident etc but could involve care discussion time.  Rush Farmer, M.D. [facility] Pulmonary/Critical Care Medicine. Pager: [phone]. After hours pager: [phone].

## 2018-10-08 ENCOUNTER — Inpatient Hospital Stay: Payer: 59

## 2018-10-08 ENCOUNTER — Inpatient Hospital Stay: Payer: 59 | Admitting: Hematology

## 2018-10-08 ENCOUNTER — Inpatient Hospital Stay (HOSPITAL_COMMUNITY): Payer: 59

## 2018-10-08 DIAGNOSIS — R6521 Severe sepsis with septic shock: Secondary | ICD-10-CM | POA: Diagnosis not present

## 2018-10-08 DIAGNOSIS — J80 Acute respiratory distress syndrome: Secondary | ICD-10-CM | POA: Diagnosis not present

## 2018-10-08 DIAGNOSIS — B20 Human immunodeficiency virus [HIV] disease: Secondary | ICD-10-CM

## 2018-10-08 DIAGNOSIS — N170 Acute kidney failure with tubular necrosis: Secondary | ICD-10-CM | POA: Diagnosis not present

## 2018-10-08 DIAGNOSIS — G9341 Metabolic encephalopathy: Secondary | ICD-10-CM | POA: Diagnosis not present

## 2018-10-08 DIAGNOSIS — B59 Pneumocystosis: Secondary | ICD-10-CM | POA: Diagnosis not present

## 2018-10-08 DIAGNOSIS — A419 Sepsis, unspecified organism: Secondary | ICD-10-CM | POA: Diagnosis not present

## 2018-10-08 DIAGNOSIS — N179 Acute kidney failure, unspecified: Secondary | ICD-10-CM

## 2018-10-08 DIAGNOSIS — Z0184 Encounter for antibody response examination: Secondary | ICD-10-CM | POA: Diagnosis not present

## 2018-10-08 DIAGNOSIS — Z20828 Contact with and (suspected) exposure to other viral communicable diseases: Secondary | ICD-10-CM | POA: Diagnosis not present

## 2018-10-08 DIAGNOSIS — R748 Abnormal levels of other serum enzymes: Secondary | ICD-10-CM | POA: Diagnosis present

## 2018-10-08 DIAGNOSIS — R591 Generalized enlarged lymph nodes: Secondary | ICD-10-CM

## 2018-10-08 LAB — COMPREHENSIVE METABOLIC PANEL
ALT: 20 U/L (ref 0–44)
ALT: 24 U/L (ref 0–44)
AST: 110 U/L — ABNORMAL HIGH (ref 15–41)
AST: 129 U/L — ABNORMAL HIGH (ref 15–41)
Albumin: 1.4 g/dL — ABNORMAL LOW (ref 3.5–5.0)
Albumin: 1.5 g/dL — ABNORMAL LOW (ref 3.5–5.0)
Alkaline Phosphatase: 254 U/L — ABNORMAL HIGH (ref 38–126)
Alkaline Phosphatase: 291 U/L — ABNORMAL HIGH (ref 38–126)
Anion gap: 18 — ABNORMAL HIGH (ref 5–15)
Anion gap: 13 (ref 5–15)
BUN: 89 mg/dL — ABNORMAL HIGH (ref 6–20)
BUN: 95 mg/dL — ABNORMAL HIGH (ref 6–20)
CO2: 49 mmol/L — ABNORMAL HIGH (ref 22–32)
CO2: 18 mmol/L — ABNORMAL LOW (ref 22–32)
Calcium: 5.5 mg/dL — CL (ref 8.9–10.3)
Calcium: 6 mg/dL — CL (ref 8.9–10.3)
Chloride: 110 mmol/L (ref 98–111)
Chloride: 115 mmol/L — ABNORMAL HIGH (ref 98–111)
Creatinine, Ser: 2.4 mg/dL — ABNORMAL HIGH (ref 0.44–1.00)
Creatinine, Ser: 2.73 mg/dL — ABNORMAL HIGH (ref 0.44–1.00)
GFR calc Af Amer: 30 mL/min — ABNORMAL LOW (ref >60)
GFR calc Af Amer: 26 mL/min — ABNORMAL LOW (ref >60)
GFR calc non Af Amer: 26 mL/min — ABNORMAL LOW (ref >60)
GFR calc non Af Amer: 22 mL/min — ABNORMAL LOW (ref >60)
Glucose, Bld: 123 mg/dL — ABNORMAL HIGH (ref 70–99)
Glucose, Bld: 116 mg/dL — ABNORMAL HIGH (ref 70–99)
Potassium: 5.8 mmol/L — ABNORMAL HIGH (ref 3.5–5.1)
Potassium: 6.1 mmol/L — ABNORMAL HIGH (ref 3.5–5.1)
Sodium: 177 mmol/L (ref 135–145)
Sodium: 146 mmol/L — ABNORMAL HIGH (ref 135–145)
Total Bilirubin: 3.7 mg/dL — ABNORMAL HIGH (ref 0.3–1.2)
Total Bilirubin: 4.4 mg/dL — ABNORMAL HIGH (ref 0.3–1.2)
Total Protein: 4.4 g/dL — ABNORMAL LOW (ref 6.5–8.1)
Total Protein: 5.1 g/dL — ABNORMAL LOW (ref 6.5–8.1)

## 2018-10-08 LAB — CBC
HCT: 29.6 % — ABNORMAL LOW (ref 36.0–46.0)
Hemoglobin: 9.8 g/dL — ABNORMAL LOW (ref 12.0–15.0)
MCH: 30.2 pg (ref 26.0–34.0)
MCHC: 33.1 g/dL (ref 30.0–36.0)
MCV: 91.4 fL (ref 80.0–100.0)
Platelets: 12 10*3/uL — CL (ref 150–400)
RBC: 3.24 MIL/uL — ABNORMAL LOW (ref 3.87–5.11)
RDW: 19.1 % — ABNORMAL HIGH (ref 11.5–15.5)
WBC: 24.7 10*3/uL — ABNORMAL HIGH (ref 4.0–10.5)
nRBC: 7.2 % — ABNORMAL HIGH (ref 0.0–0.2)

## 2018-10-08 LAB — BLOOD GAS, ARTERIAL
Acid-base deficit: 10.9 mmol/L — ABNORMAL HIGH (ref 0.0–2.0)
Acid-base deficit: 14 mmol/L — ABNORMAL HIGH (ref 0.0–2.0)
Acid-base deficit: 15 mmol/L — ABNORMAL HIGH (ref 0.0–2.0)
Acid-base deficit: 16.2 mmol/L — ABNORMAL HIGH (ref 0.0–2.0)
Bicarbonate: 13.3 mmol/L — ABNORMAL LOW (ref 20.0–28.0)
Bicarbonate: 13.5 mmol/L — ABNORMAL LOW (ref 20.0–28.0)
Bicarbonate: 13.7 mmol/L — ABNORMAL LOW (ref 20.0–28.0)
Bicarbonate: 15.7 mmol/L — ABNORMAL LOW (ref 20.0–28.0)
Drawn by: 225631
Drawn by: 225631
Drawn by: 225631
Drawn by: 257701
FIO2: 100
FIO2: 100
FIO2: 100
FIO2: 60
MECHVT: 0.38 mL
MECHVT: 0.42 mL
MECHVT: 0.42 mL
MECHVT: 420 mL
O2 Saturation: 86.2 %
O2 Saturation: 95 %
O2 Saturation: 96.1 %
O2 Saturation: 96.6 %
PEEP: 10 cmH2O
PEEP: 10 cmH2O
PEEP: 10 cmH2O
PEEP: 12 cmH2O
Patient temperature: 100
Patient temperature: 100.4
Patient temperature: 98
Patient temperature: 99.1
RATE: 30 resp/min
RATE: 30 resp/min
RATE: 30 resp/min
RATE: 34 resp/min
pCO2 arterial: 38.6 mmHg (ref 32.0–48.0)
pCO2 arterial: 41.7 mmHg (ref 32.0–48.0)
pCO2 arterial: 43.5 mmHg (ref 32.0–48.0)
pCO2 arterial: 48.2 mmHg — ABNORMAL HIGH (ref 32.0–48.0)
pH, Arterial: 7.082 — CL (ref 7.350–7.450)
pH, Arterial: 7.118 — CL (ref 7.350–7.450)
pH, Arterial: 7.132 — CL (ref 7.350–7.450)
pH, Arterial: 7.23 — ABNORMAL LOW (ref 7.350–7.450)
pO2, Arterial: 59.6 mmHg — ABNORMAL LOW (ref 83.0–108.0)
pO2, Arterial: 86.7 mmHg (ref 83.0–108.0)
pO2, Arterial: 92.1 mmHg (ref 83.0–108.0)
pO2, Arterial: 97.7 mmHg (ref 83.0–108.0)

## 2018-10-08 LAB — POTASSIUM: Potassium: 4.7 mmol/L (ref 3.5–5.1)

## 2018-10-08 LAB — BASIC METABOLIC PANEL
Anion gap: 14 (ref 5–15)
BUN: 92 mg/dL — ABNORMAL HIGH (ref 6–20)
CO2: 14 mmol/L — ABNORMAL LOW (ref 22–32)
Calcium: 6.3 mg/dL — CL (ref 8.9–10.3)
Chloride: 116 mmol/L — ABNORMAL HIGH (ref 98–111)
Creatinine, Ser: 2.42 mg/dL — ABNORMAL HIGH (ref 0.44–1.00)
GFR calc Af Amer: 30 mL/min — ABNORMAL LOW (ref >60)
GFR calc non Af Amer: 26 mL/min — ABNORMAL LOW (ref >60)
Glucose, Bld: 157 mg/dL — ABNORMAL HIGH (ref 70–99)
Potassium: 7 mmol/L (ref 3.5–5.1)
Sodium: 144 mmol/L (ref 135–145)

## 2018-10-08 LAB — GLUCOSE, CAPILLARY
Glucose-Capillary: 124 mg/dL — ABNORMAL HIGH (ref 70–99)
Glucose-Capillary: 160 mg/dL — ABNORMAL HIGH (ref 70–99)
Glucose-Capillary: 96 mg/dL (ref 70–99)
Glucose-Capillary: 101 mg/dL — ABNORMAL HIGH (ref 70–99)

## 2018-10-08 LAB — RENAL FUNCTION PANEL
Albumin: 1.8 g/dL — ABNORMAL LOW (ref 3.5–5.0)
Anion gap: 13 (ref 5–15)
BUN: 82 mg/dL — ABNORMAL HIGH (ref 6–20)
CO2: 22 mmol/L (ref 22–32)
Calcium: 5.9 mg/dL — CL (ref 8.9–10.3)
Chloride: 107 mmol/L (ref 98–111)
Creatinine, Ser: 2.14 mg/dL — ABNORMAL HIGH (ref 0.44–1.00)
GFR calc Af Amer: 35 mL/min — ABNORMAL LOW (ref >60)
GFR calc non Af Amer: 30 mL/min — ABNORMAL LOW (ref >60)
Glucose, Bld: 116 mg/dL — ABNORMAL HIGH (ref 70–99)
Phosphorus: 9.4 mg/dL — ABNORMAL HIGH (ref 2.5–4.6)
Potassium: 4.9 mmol/L (ref 3.5–5.1)
Sodium: 142 mmol/L (ref 135–145)

## 2018-10-08 LAB — NA AND K (SODIUM & POTASSIUM), RAND UR
Potassium Urine: 48 mmol/L
Sodium, Ur: 33 mmol/L

## 2018-10-08 LAB — SODIUM, URINE, RANDOM: Sodium, Ur: 33 mmol/L

## 2018-10-08 LAB — PREPARE PLATELET PHERESIS: Unit division: 0

## 2018-10-08 LAB — BPAM PLATELET PHERESIS
Blood Product Expiration Date: 202009012359
ISSUE DATE / TIME: 202008310647
Unit Type and Rh: 5100

## 2018-10-08 LAB — CREATININE, URINE, RANDOM: Creatinine, Urine: 94.19 mg/dL

## 2018-10-08 LAB — PNEUMOCYSTIS JIROVECI SMEAR BY DFA: Pneumocystis jiroveci Ag: NEGATIVE

## 2018-10-08 LAB — PHOSPHORUS: Phosphorus: 11.8 mg/dL — ABNORMAL HIGH (ref 2.5–4.6)

## 2018-10-08 LAB — LACTIC ACID, PLASMA: Lactic Acid, Venous: 1.2 mmol/L (ref 0.5–1.9)

## 2018-10-08 LAB — MAGNESIUM: Magnesium: 2.8 mg/dL — ABNORMAL HIGH (ref 1.7–2.4)

## 2018-10-08 MED ORDER — MIDAZOLAM HCL 2 MG/2ML IJ SOLN
2.0000 mg | INTRAMUSCULAR | Status: DC | PRN
  Administered 2018-10-08 (×4): 2 mg via INTRAVENOUS
  Administered 2018-10-09: 1 mg via INTRAVENOUS
  Administered 2018-10-09 (×2): 2 mg via INTRAVENOUS
  Administered 2018-10-10 (×4): 1 mg via INTRAVENOUS
  Administered 2018-10-12: 2 mg via INTRAVENOUS
  Filled 2018-10-08 (×10): qty 2

## 2018-10-08 MED ORDER — PRISMASOL BGK 4/2.5 32-4-2.5 MEQ/L IV SOLN
INTRAVENOUS | Status: DC
  Administered 2018-10-08 – 2018-10-16 (×43): via INTRAVENOUS_CENTRAL

## 2018-10-08 MED ORDER — EMTRICITABINE-TENOFOVIR AF 200-25 MG PO TABS
1.0000 | ORAL_TABLET | Freq: Every day | ORAL | Status: DC
  Administered 2018-10-09 – 2018-10-18 (×10): 1
  Filled 2018-10-08 (×10): qty 1

## 2018-10-08 MED ORDER — SODIUM CHLORIDE 0.9 % IV SOLN
2.0000 g | Freq: Two times a day (BID) | INTRAVENOUS | Status: AC
  Administered 2018-10-08 – 2018-10-12 (×9): 2 g via INTRAVENOUS
  Filled 2018-10-08 (×9): qty 2

## 2018-10-08 MED ORDER — STERILE WATER FOR INJECTION IV SOLN
INTRAVENOUS | Status: DC
  Administered 2018-10-08: 15:00:00 via INTRAVENOUS_CENTRAL
  Filled 2018-10-08 (×3): qty 150

## 2018-10-08 MED ORDER — NOREPINEPHRINE BITARTRATE 1 MG/ML IV SOLN
0.0000 ug/min | INTRAVENOUS | Status: DC
  Administered 2018-10-08: 10:00:00 30 ug/min via INTRAVENOUS
  Filled 2018-10-08 (×2): qty 4

## 2018-10-08 MED ORDER — SODIUM BICARBONATE 4.2 % IV SOLN
50.0000 meq | Freq: Once | INTRAVENOUS | Status: AC
  Administered 2018-10-08: 50 meq via INTRAVENOUS
  Filled 2018-10-08: qty 100

## 2018-10-08 MED ORDER — VASOPRESSIN 20 UNIT/ML IV SOLN
0.0300 [IU]/min | INTRAVENOUS | Status: DC
  Administered 2018-10-08 – 2018-10-09 (×2): 0.03 [IU]/min via INTRAVENOUS
  Filled 2018-10-08 (×5): qty 2

## 2018-10-08 MED ORDER — NOREPINEPHRINE 16 MG/250ML-% IV SOLN
0.0000 ug/min | INTRAVENOUS | Status: DC
  Administered 2018-10-08: 30 ug/min via INTRAVENOUS
  Administered 2018-10-09: 11 ug/min via INTRAVENOUS
  Administered 2018-10-13: 2 ug/min via INTRAVENOUS
  Filled 2018-10-08 (×4): qty 250

## 2018-10-08 MED ORDER — ANTICOAGULANT SODIUM CITRATE 4% (200MG/5ML) IV SOLN
5.0000 mL | Status: AC | PRN
  Administered 2018-10-16: 5 mL via INTRAVENOUS
  Filled 2018-10-08 (×2): qty 5

## 2018-10-08 MED ORDER — SODIUM CHLORIDE 0.9 % FOR CRRT
INTRAVENOUS_CENTRAL | Status: DC | PRN
  Administered 2018-10-08: 15:00:00 via INTRAVENOUS_CENTRAL
  Filled 2018-10-08 (×2): qty 1000

## 2018-10-08 MED ORDER — CALCIUM GLUCONATE 10 % IV SOLN: 1.0000 g | Freq: Once | INTRAVENOUS | Status: DC

## 2018-10-08 MED ORDER — STERILE WATER FOR INJECTION IV SOLN
INTRAVENOUS | Status: DC
  Administered 2018-10-08 (×2): via INTRAVENOUS
  Filled 2018-10-08 (×2): qty 850

## 2018-10-08 MED ORDER — DEXTROSE 50 % IV SOLN
1.0000 | Freq: Once | INTRAVENOUS | Status: AC
  Administered 2018-10-08: 50 mL via INTRAVENOUS
  Filled 2018-10-08: qty 50

## 2018-10-08 MED ORDER — HEPARIN SODIUM (PORCINE) 1000 UNIT/ML DIALYSIS
1000.0000 [IU] | INTRAMUSCULAR | Status: DC | PRN
  Filled 2018-10-08: qty 6

## 2018-10-08 MED ORDER — SODIUM POLYSTYRENE SULFONATE 15 GM/60ML PO SUSP
30.0000 g | Freq: Once | ORAL | Status: AC
  Administered 2018-10-08: 06:00:00 30 g
  Filled 2018-10-08: qty 120

## 2018-10-08 MED ORDER — INSULIN ASPART 100 UNIT/ML IV SOLN
10.0000 [IU] | Freq: Once | INTRAVENOUS | Status: AC
  Administered 2018-10-08: 10 [IU] via INTRAVENOUS

## 2018-10-08 MED ORDER — SODIUM ZIRCONIUM CYCLOSILICATE 10 G PO PACK
10.0000 g | PACK | Freq: Once | ORAL | Status: AC
  Administered 2018-10-08: 15:00:00 10 g via ORAL
  Filled 2018-10-08: qty 1

## 2018-10-08 MED ORDER — ALTEPLASE 2 MG IJ SOLR
2.0000 mg | Freq: Once | INTRAMUSCULAR | Status: DC | PRN
  Filled 2018-10-08 (×2): qty 2

## 2018-10-08 MED ORDER — DOLUTEGRAVIR SODIUM 50 MG PO TABS
50.0000 mg | ORAL_TABLET | Freq: Every day | ORAL | Status: DC
  Administered 2018-10-09 – 2018-10-18 (×10): 50 mg via ORAL
  Filled 2018-10-08 (×10): qty 1

## 2018-10-08 MED ORDER — SODIUM BICARBONATE 8.4 % IV SOLN: 50.0000 meq | Freq: Once | INTRAVENOUS | Status: AC

## 2018-10-08 MED ORDER — CALCIUM GLUCONATE-NACL 1-0.675 GM/50ML-% IV SOLN
1.0000 g | Freq: Once | INTRAVENOUS | Status: AC
  Administered 2018-10-08: 06:00:00 1000 mg via INTRAVENOUS
  Filled 2018-10-08: qty 50

## 2018-10-08 MED ORDER — SODIUM CHLORIDE 0.9 % IV SOLN: 2.0000 g | INTRAVENOUS | Status: DC

## 2018-10-08 MED ORDER — PRISMASOL BGK 4/2.5 32-4-2.5 MEQ/L REPLACEMENT SOLN
Status: DC
  Administered 2018-10-08 – 2018-10-16 (×11): via INTRAVENOUS_CENTRAL

## 2018-10-08 MED ORDER — ACETAMINOPHEN 160 MG/5ML PO SOLN
650.0000 mg | ORAL | Status: DC | PRN
  Filled 2018-10-08 (×2): qty 20.3

## 2018-10-08 MED ORDER — SODIUM CHLORIDE 0.9% IV SOLUTION: Freq: Once | INTRAVENOUS | Status: DC

## 2018-10-08 MED ORDER — PRISMASOL BGK 4/2.5 32-4-2.5 MEQ/L REPLACEMENT SOLN
Status: DC
  Administered 2018-10-08 – 2018-10-15 (×13): via INTRAVENOUS_CENTRAL

## 2018-10-08 MED ORDER — FENTANYL 2500MCG IN NS 250ML (10MCG/ML) PREMIX INFUSION
25.0000 ug/h | INTRAVENOUS | Status: DC
  Administered 2018-10-08: 14:00:00 25 ug/h via INTRAVENOUS
  Administered 2018-10-08: 12.5 ug/h via INTRAVENOUS
  Administered 2018-10-10: 05:00:00 25 ug/h via INTRAVENOUS
  Administered 2018-10-10 – 2018-10-12 (×2): 50 ug/h via INTRAVENOUS
  Filled 2018-10-08 (×3): qty 250

## 2018-10-08 MED ORDER — SODIUM BICARBONATE 8.4 % IV SOLN
INTRAVENOUS | Status: AC
  Filled 2018-10-08: qty 50

## 2018-10-08 MED ORDER — CALCIUM GLUCONATE-NACL 1-0.675 GM/50ML-% IV SOLN
1.0000 g | Freq: Once | INTRAVENOUS | Status: AC
  Administered 2018-10-08: 1000 mg via INTRAVENOUS
  Filled 2018-10-08: qty 50

## 2018-10-08 NOTE — Progress Notes (Addendum)
Patient ID: Krystal Short, female   DOB: Feb 27, 1985, 33 y.o.   MRN: [id number]         [facility]  Date of Admission:  10/05/2018           Day 3 cefepime ASSESSMENT: The cause of her progressive multisystem illness remains uncertain.  I am not convinced that she has ever had pneumocystis pneumonia.  That is rare when the patient's HIV viral load is undetectable, even when the CD4 is low.  I do not think that her cytopenias are related to her HIV medications.  If her thrombocytopenia allows I would recommend lymph node and/or lung biopsy.  I would also consider transfer to a tertiary [facility] for further evaluation.  PLAN: 1. Continue cefepime pending BAL studies  Active Problems:   Pulmonary infiltrates   Lymphadenopathy, generalized   Acute respiratory failure with hypoxemia (HCC)   Symptomatic anemia   Thrombocytopenia (HCC)   HIV disease (HCC)   Molluscum contagiosum   ASCUS with positive high risk HPV cervical   AKI (acute kidney injury) ([place])   Elevated bilirubin   Elevated liver enzymes   Scheduled Meds: . sodium chloride   Intravenous Once  . atovaquone  1,500 mg Oral Daily  . azithromycin  1,200 mg Oral Q Tue  . benzonatate  100 mg Oral TID  . bictegravir-emtricitabine-tenofovir AF  1 tablet Oral Daily  . chlorhexidine  15 mL Mouth Rinse BID  . chlorhexidine gluconate (MEDLINE KIT)  15 mL Mouth Rinse BID  . Chlorhexidine Gluconate Cloth  6 each Topical Daily  . dextromethorphan-guaiFENesin  1 tablet Oral BID  . insulin aspart  2-6 Units Subcutaneous Q4H  . mouth rinse  15 mL Mouth Rinse 10 times per day  . methylPREDNISolone (SOLU-MEDROL) injection  80 mg Intravenous Q12H  . metoCLOPramide (REGLAN) injection  5 mg Intravenous Once  . mometasone-formoterol  2 puff Inhalation BID  . pantoprazole sodium  40 mg Per Tube Daily  . sodium chloride flush  10-40 mL Intracatheter Q12H   Continuous Infusions: . sodium chloride Stopped  (10/07/18 0851)  . [START ON 10/09/2018] ceFEPime (MAXIPIME) IV    . dexmedetomidine (PRECEDEX) IV infusion 0.8 mcg/kg/hr (10/08/18 0654)  . norepinephrine (LEVOPHED) Adult infusion 25 mcg/min (10/08/18 0934)  .  sodium bicarbonate (isotonic) infusion in sterile water 100 mL/hr at 10/08/18 0646   PRN Meds:.sodium chloride, acetaminophen (TYLENOL) oral liquid 160 mg/5 mL, albuterol, chlorpheniramine-HYDROcodone, diazepam, fentaNYL (SUBLIMAZE) injection, midazolam, morphine injection, sodium chloride flush   SUBJECTIVE: [name] is now in her eighth hospitalization since May of last year.  She has had similar presentations each time.  She has had progressively worsening pulmonary infiltrates leading to respiratory failure, symptomatic anemia, severe thrombocytopenia, generalized lymphadenopathy and intermittent fevers.  He was discharged on 10/04/2018 but readmitted 2 days later with respiratory failure.  She is intubated.  BAL Gram stain shows gram-positive cocci.  Culture is pending.  Pneumocystis smear is pending.  Her liver enzymes and bilirubin are now.  Her LDH is 735 and her ferritin is 9379.  She is undergone bone marrow exam in May and August of this year.  Both times it showed hypercellular marrow with trilineage hematopoiesis.  AFB and fungal cultures were negative.  She underwent bronchoscopy with lung biopsy in June.  Only scant lung parenchyma was seen.  There were no granulomata.  No pneumocystis smear was obtained.  Her HIV has been fully suppressed on Biktarvy but she has not had  significant CD4 reconstitution.  She has been on atovaquone for pneumocystis prophylaxis and azithromycin for Mycobacterium avium prophylaxis.  Review of Systems: Review of Systems  Unable to perform ROS: Intubated    Allergies  Allergen Reactions  . Heparin Other (See Comments)    Unknown reaction    OBJECTIVE: Vitals:   10/08/18 0600 10/08/18 0700 10/08/18 0737 10/08/18 0800  BP: (!) 121/56 115/72   (!) 111/52  Pulse: (!) 110 63 (!) 110 (!) 108  Resp: (!) 24 (!) 27 (!) 28 (!) 32  Temp: 100.2 F (37.9 C) 98.8 F (37.1 C) 98.8 F (37.1 C) 98.6 F (37 C)  TempSrc:  Core Bladder   SpO2: 100%  97% 99%  Weight:      Height:       Body mass index is 23.08 kg/m.  Physical Exam Constitutional:      Comments: She is sedated on the ventilator.  Cardiovascular:     Rate and Rhythm: Normal rate and regular rhythm.     Heart sounds: No murmur. No friction rub.  Pulmonary:     Breath sounds: Rales present. No wheezing.     Comments: She has bloody secretions in her endotracheal tube. Abdominal:     General: There is distension.     Comments: Absent bowel sounds.  Skin:    Comments: Papules consistent with molluscum contagiosum on face.     Lab Results Lab Results  Component Value Date   WBC 24.7 (H) 10/08/2018   HGB 9.8 (L) 10/08/2018   HCT 29.6 (L) 10/08/2018   MCV 91.4 10/08/2018   PLT 12 (LL) 10/08/2018    Lab Results  Component Value Date   CREATININE 2.42 (H) 10/08/2018   BUN 92 (H) 10/08/2018   NA 144 10/08/2018   K 7.0 (HH) 10/08/2018   CL 116 (H) 10/08/2018   CO2 14 (L) 10/08/2018    Lab Results  Component Value Date   ALT 28 10/07/2018   AST 119 (H) 10/07/2018   ALKPHOS 187 (H) 10/07/2018   BILITOT 3.5 (H) 10/07/2018     Microbiology: Recent Results (from the past 240 hour(s))  Blood Culture (routine x 2)     Status: None   Collection Time: 10/01/18  6:32 PM   Specimen: BLOOD RIGHT ARM  Result Value Ref Range Status   Specimen Description   Final    BLOOD RIGHT ARM Performed at [facility], [place] [address] [place], [place] [address]    Special Requests   Final    BOTTLES DRAWN AEROBIC AND ANAEROBIC Blood Culture adequate volume Performed at [place] [address], [place], [place] [address]    Culture   Final    NO GROWTH 5 DAYS Performed at [facility], [place] [address] [place], [place]  [address]    Report Status 10/06/2018 FINAL  Final  Urine culture     Status: Abnormal   Collection Time: 10/01/18  6:32 PM   Specimen: In/Out Cath Urine  Result Value Ref Range Status   Specimen Description   Final    IN/OUT CATH URINE Performed at [place] [address], [place], [place] [address]    Special Requests   Final    NONE Performed at [facility], [place] [address] [place], [place] [address]    Culture (A)  Final    <10,000 COLONIES/mL INSIGNIFICANT GROWTH Performed at [place],  [place] [address]    Report Status 10/02/2018 FINAL  Final  Novel Coronavirus, NAA (Hosp order, Send-out to Ref Lab; TAT 18-24 hrs     Status: None   Collection Time: 10/01/18  6:55 PM  Result Value Ref Range Status   SARS-CoV-2, NAA NOT DETECTED NOT DETECTED Final    Comment: (NOTE) This test was developed and its performance characteristics determined by [employer]. This test has not been FDA cleared or approved. This test has been authorized by FDA under an Emergency Use Authorization (EUA). This test is only authorized for the duration of time the declaration that circumstances exist justifying the authorization of the emergency use of in vitro diagnostic tests for detection of SARS-CoV-2 virus and/or diagnosis of COVID-19 infection under section 564(b)(1) of the Act, 21 U.S.C. 093ATF-5(D)(3), unless the authorization is terminated or revoked sooner. When diagnostic testing is negative, the possibility of a false negative result should be considered in the context of a patient's recent exposures and the presence of clinical signs and symptoms consistent with COVID-19. An individual without symptoms of COVID-19 and who is not shedding SARS-CoV-2 virus would expect to have a negative (not detected) result in this assay. Performed  At: [facility] [place], [place] [id number]  Rush Farmer MD WC:[id number]    [place]  Final    Comment: Performed at [place] [address] [place], [place] [address]  MRSA PCR Screening     Status: None   Collection Time: 10/01/18 10:44 PM   Specimen: Nasal Mucosa; Nasopharyngeal  Result Value Ref Range Status   MRSA by PCR NEGATIVE NEGATIVE Final    Comment:        The GeneXpert MRSA Assay (FDA approved for NASAL specimens only), is one component of a comprehensive MRSA colonization surveillance program. It is not intended to diagnose MRSA infection nor to guide or monitor treatment for MRSA infections. Performed at [place], [place] [address], [place], [place] [address]   Blood Culture (routine x 2)     Status: None   Collection Time: 10/02/18 11:00 AM   Specimen: BLOOD RIGHT HAND  Result Value Ref Range Status   Specimen Description   Final    BLOOD RIGHT HAND Performed at [place] [address], [place], [place] [address]    Special Requests   Final    BOTTLES DRAWN AEROBIC AND ANAEROBIC Blood Culture adequate volume Performed at [place] [address], [place], [place] [address]    Culture   Final    NO GROWTH 5 DAYS Performed at [facility], [place] [address] [place], [place] [address]    Report Status 10/07/2018 FINAL  Final  Culture, blood (Routine x 2)     Status: None (Preliminary result)   Collection Time: 10/05/18  1:20 AM   Specimen: BLOOD  Result Value Ref Range Status   Specimen Description   Final    BLOOD PICC LINE Performed at [place] [address], [place], [place] [address]    Special Requests   Final    BOTTLES DRAWN AEROBIC AND ANAEROBIC Blood Culture adequate volume Performed at [place] [address] [place], [place] [address]    Culture   Final    NO GROWTH 2 DAYS Performed at [place] [address], [place], [place] [address]    Report Status PENDING  Incomplete  Culture, blood (Routine x 2)     Status: None (Preliminary result)   Collection Time: 10/05/18 11:13 PM   Specimen: BLOOD  Result Value Ref Range Status   Specimen Description   Final    BLOOD LEFT ARM Performed at [place] [address] [place], [place] [address]    Special Requests   Final    BOTTLES DRAWN AEROBIC AND ANAEROBIC Blood Culture adequate volume Performed at [place] [address] [place], [place] [address]    Culture   Final    NO GROWTH 2 DAYS Performed at [facility] [address] [place], [place] [address]    Report Status PENDING  Incomplete  SARS Coronavirus 2 [facility] order, Performed in [facility] hospital lab) Nasopharyngeal Nasopharyngeal Swab     Status: None   Collection Time: 10/05/18 11:22 PM   Specimen: Nasopharyngeal Swab  Result Value Ref Range Status   SARS Coronavirus 2 NEGATIVE NEGATIVE Final    Comment: (NOTE) If result is NEGATIVE SARS-CoV-2 target nucleic acids are NOT DETECTED. The SARS-CoV-2 RNA is generally detectable in upper and lower  respiratory specimens during the acute phase of infection. The lowest  concentration of SARS-CoV-2 viral copies this assay can detect is 250  copies / mL. A negative result does not preclude SARS-CoV-2 infection  and should not be used as the sole basis for treatment or other  patient management decisions.  A negative result may occur with  improper specimen collection / handling, submission of specimen other  than nasopharyngeal swab, presence of viral mutation(s) within the  areas targeted by this assay, and inadequate number of viral copies  (<250 copies / mL). A negative result must be combined with clinical  observations, patient history, and epidemiological information. If result is POSITIVE SARS-CoV-2 target nucleic acids are DETECTED. The SARS-CoV-2 RNA is  generally detectable in upper and lower  respiratory specimens dur ing the acute phase of infection.  Positive  results are indicative of active infection with SARS-CoV-2.  Clinical  correlation with patient history and other diagnostic information is  necessary to determine patient infection status.  Positive results do  not rule out bacterial infection or co-infection with other viruses. If result is PRESUMPTIVE POSTIVE SARS-CoV-2 nucleic acids MAY BE PRESENT.   A presumptive positive result was obtained on the submitted specimen  and confirmed on repeat testing.  While 2019 novel coronavirus  (SARS-CoV-2) nucleic acids may be present in the submitted sample  additional confirmatory testing may be necessary for epidemiological  and / or clinical management purposes  to differentiate between  SARS-CoV-2 and other Sarbecovirus currently known to infect humans.  If clinically indicated additional testing with an alternate test  methodology [phone]) is advised. The SARS-CoV-2 RNA is generally  detectable in upper and lower respiratory sp ecimens during the acute  phase of infection. The expected result is Negative. Fact Sheet for Patients:  StrictlyIdeas.no Fact Sheet for Healthcare Providers: BankingDealers.co.za This test is not yet approved or cleared by the [place] FDA and has been authorized for detection and/or diagnosis of SARS-CoV-2 by FDA under an Emergency Use Authorization (EUA).  This EUA will remain in effect (meaning this test can be used) for the duration of the COVID-19 declaration under Section 564(b)(1) of the Act, 21 U.S.C. section 360bbb-3(b)(1), unless the authorization is terminated or revoked sooner. Performed at [facility], [place] [address] [place], [place] [address]   Culture, bal-quantitative  Status: None (Preliminary result)   Collection Time: 10/07/18 10:16 AM   Specimen:  Bronchoalveolar Lavage; Respiratory  Result Value Ref Range Status   Specimen Description   Final    BRONCHIAL ALVEOLAR LAVAGE Performed at [place] [address] [place], [place] [address]    Special Requests   Final    Immunocompromised Performed at [facility], [place] [address] [place] [address] [place], [place] [address]    Gram Stain   Final    FEW SQUAMOUS EPITHELIAL CELLS PRESENT MODERATE WBC PRESENT, PREDOMINANTLY MONONUCLEAR FEW GRAM POSITIVE COCCI Performed at [facility], [place] [address], [place], [place] [address]    Culture PENDING  Incomplete   Report Status PENDING  Incomplete    Michel Bickers, MD [facility] for [facility] [phone] pager   [phone] cell 10/08/2018, 9:39 AM

## 2018-10-08 NOTE — Consult Note (Signed)
Renal Service Consult Note [facility]  Sherena Valladares 10/08/2018 [place] Requesting Physician:  Dr  Nelda Marseille  Reason for Consult:  AKI, met acidosis HPI: The patient is a 33 y.o. year-old 33 yo w/ hx of advanced HIV/ AIDS, hx recurrent PJP admitted on 10/06/18 with SOB. Also has hx of chronic anemia/ low plts requiring multiple transfusions.  Had severely low plts on admit.   She has AKI w/ metabolic acidosis, asked to see for renal failure.    ROS  denies CP  no joint pain   no HA  no blurry vision  no rash  no diarrhea  no nausea/ vomiting  no dysuria  no difficulty voiding  no change in urine color    Past Medical History  Past Medical History:  Diagnosis Date  . Acute hyponatremia 06/03/2017  . Anemia   . CAP (community acquired pneumonia) 06/03/2017  . Facial dermatitis 06/03/2017  . HIV (human immunodeficiency virus infection) ([place])    diagnosed in April 2019  . Pleurisy 06/03/2017  . 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 ([place])   . Thrush of mouth and esophagus ([place])   . UTI (urinary tract infection)    Past Surgical History  Past Surgical History:  Procedure Laterality Date  . BIOPSY  07/12/2018   Procedure: BIOPSY;  Surgeon: Laurin Coder, MD;  Location: [facility]OSCOPY;  Service: Endoscopy;;  . BRONCHIAL WASHINGS  07/12/2018   Procedure: BRONCHIAL WASHINGS;  Surgeon: Laurin Coder, MD;  Location: [facility];  Service: Endoscopy;;  . ENDOBRONCHIAL ULTRASOUND N/A 07/12/2018   Procedure: ENDOBRONCHIAL ULTRASOUND;  Surgeon: Laurin Coder, MD;  Location: [facility];  Service: Endoscopy;  Laterality: N/A;  . FINE NEEDLE ASPIRATION BIOPSY  07/12/2018   Procedure: FINE NEEDLE ASPIRATION BIOPSY;  Surgeon: Laurin Coder, MD;  Location: [facility];  Service: Endoscopy;;  . NO PAST SURGERIES    . VIDEO BRONCHOSCOPY N/A 07/12/2018   Procedure: VIDEO BRONCHOSCOPY WITHOUT FLUORO;  Surgeon: Laurin Coder, MD;  Location:  [facility];  Service: Endoscopy;  Laterality: N/A;   Family History  Family History  Problem Relation Age of Onset  . Healthy Father   . Healthy Mother   . Asthma Paternal Grandmother    Social History  reports that she quit smoking about 21 months ago. Her smoking use included cigarettes and cigars. She has a 2.00 pack-year smoking history. She has never used smokeless tobacco. She reports current alcohol use. She reports that she does not use drugs. Allergies  Allergies  Allergen Reactions  . Heparin Other (See Comments)    Unknown reaction   Home medications Prior to Admission medications   Medication Sig Start Date End Date Taking? Authorizing Provider  albuterol (VENTOLIN HFA) 108 (90 Base) MCG/ACT inhaler Inhale 2 puffs into the lungs every 6 (six) hours as needed for wheezing or shortness of breath.   Yes [provider]  atovaquone (MEPRON) 750 MG/5ML suspension Take 10 mLs (1,500 mg total) by mouth daily. 09/18/18 10/18/18 Yes Golden Circle, FNP  azithromycin (ZITHROMAX) 600 MG tablet Take 2 tablets (1,200 mg total) by mouth once a week. Patient taking differently: Take 1,200 mg by mouth every Tuesday.  09/17/18  Yes Oswald Hillock, MD  bictegravir-emtricitabine-tenofovir AF (BIKTARVY) 50-200-25 MG TABS tablet Take 1 tablet by mouth daily. 09/18/18  Yes Golden Circle, FNP  predniSONE (DELTASONE) 10 MG tablet Take 67m daily for 7 days,Take 366mdaily for 7 days,Take 2046maily  for 7 days,Take 78m daily for 7 days, Take 169mevery other day for 7 days  then stop Patient taking differently: Take 10-40 mg by mouth See admin instructions. Take 4054maily for 7 days,Take 58m13mily for 7 days,Take 20mg35mly for 7 days,Take 10mg 53my for 7 days, Take 10mg e33m other day for 7 days  then stop 10/04/18  Yes Patel, Lavina Hammanenzonatate (TESSALON) 100 MG capsule Take 1 capsule (100 mg total) by mouth 3 (three) times daily. 10/04/18   Patel, Lavina Hammandextromethorphan-guaiFENesin ([facility]INEWayne [facility]-600 MG 12hr tablet Take 1 tablet by mouth 2 (two) times daily. 10/04/18   Patel, Lavina HammanLiver Function Tests Recent Labs  Lab 10/07/18 1209 10/08/18 0956 10/08/18 1053  AST 119* 110* 129*  ALT _0 ALKPHOS 187* 254* 291*  BILITOT 3.5* 3.7* 4.4*  PROT 6.0* 4.4* 5.1*  ALBUMIN 2.0* 1.4* 1.5*   No results for input(s): LIPASE, AMYLASE in the last 168 hours. CBC Recent Labs  Lab 10/02/18 0653 10/03/18 0210 10/04/18 0514  10/06/18 1153 10/06/18 2000 10/07/18 0350 10/08/18 0335  WBC 17.9* 24.2* 20.4*   < > 21.2*  --  20.1* 24.7*  NEUTROABS 14.7* 16.0* 12.5*  --   --   --   --   --   HGB 10.8*  10.6* 10.1* 8.5*   < > 6.8* 12.0 12.3 9.8*  HCT 35.5*  33.5* 31.8* 26.0*   < > 21.5* 36.0 36.6 29.6*  MCV 98.9 92.4 92.5   < > 94.3  --  89.9 91.4  PLT 30* 14* 13*   < > 17*  --  7* 12*   < > = values in this interval not displayed.   Basic Metabolic Panel Recent Labs  Lab 10/04/18 0514 10/05/18 0120 10/06/18 0300 10/07/18 0350 10/07/18 1209 10/08/18 0335 10/08/18 0956 10/08/18 1053  NA 134* 137 138 139 139 144 177* 146*  K 4.1 4.3 4.2 5.5* 6.5* 7.0* 5.8* 6.1*  CL 110 112* 112* 114* 113* 116* 110 115*  CO2 16* 16*  --  15* 16* 14* 49* 18*  GLUCOSE 111* 134* 130* 136* 149* 157* 123* 116*  BUN 43* 62* 54* 76* 79* 92* 89* 95*  CREATININE 1.05* 1.57* 1.50* 1.75* 1.92* 2.42* 2.40* 2.73*  CALCIUM 7.5* 7.6*  --  7.2* 6.9* 6.3* 5.5* 6.0*  PHOS  --   --   --   --   --  11.8*  --   --    Iron/TIBC/Ferritin/ %Sat    Component Value Date/Time   IRON 15 (L) 09/05/2018 0640   TIBC 146 (L) 09/05/2018 0640   FERRITIN 9,379 (H) 10/02/2018 1112   IRONPCTSAT 10 (L) 09/05/2018 0640    Vitals:   10/08/18 1200 10/08/18 1211 10/08/18 1215 10/08/18 1230  BP:  (!) 125/58 113/75 (!) 117/31  Pulse:      Resp: (!) 41 (!) 47 (!) 46 (!) 47  Temp: (!) 97.5 F (36.4 C) (!) 97.5 F (36.4 C) (!) 97.5 F (36.4 C) (!) 97.5 F (36.4 C)   TempSrc:      SpO2:  100%    Weight:      Height:        Exam Gen intubated and sedated L IJ temp hd cath No rash, cyanosis or gangrene Sclera anicteric, throat w ett  No jvd or bruits Chest coarse rales/ rhonchi bilat  RRR no MRG, tachy Abd soft ntnd  no mass or ascites +bs GU foley draining dark brown urine minimal amts MS no joint effusions or deformity Ext 1-2+ bilat LE/UE edema, no wounds or ulcers Neuro is sedated as above    Home meds:  - atovaquone 1.5 gm qd/ azithromycin 1.2 gm weekly  - bictegravir-emtricitabine-tenofovir qd  - doxycyxline 100 bid/ cefdinir 300 bid  - prednisone taper 40 qd > 10 qd     UA pending  UNa 33, UCreat 94      Assessment: 1. AKI - due to shock/ hypoperfusion, ATN 2. Shock /  Hypotension - on pressors 3. Hyperkalemia - not severe, will improve w/ acid correction 4. Metabolic acidosis - also due to shock/ sepsis 5. Recurrent PNA/ resp failure - hx CAP , PJP  6. AIDS /HIV 7. Severe thrombocytopenia    Plan - recommend CRRT w/ bicarb RF's and no anticoagulants    Kelly Splinter  MD 10/08/2018, 1:03 PM

## 2018-10-08 NOTE — Progress Notes (Signed)
Mother and husband updated at bedside. Reviewed confirmation of HD catheter with family and staff. No complications from procedure.   Noe Gens, NP-C [facility] Pgr: [phone] or if no answer [phone] 10/08/2018, 2:44 PM

## 2018-10-08 NOTE — Progress Notes (Addendum)
Pharmacy Antibiotic Note  Krystal Short is a 33 y.o. female admitted on 10/05/2018 with pneumonia.  Pharmacy has been consulted for Cefepime, Bactrim dosing. HIV AIDS with several recent hospitalizations for pneumocystis jirovecii PNA, recent d/c 8/28 on IV abx & doxycycline  Plan: This am reduced Cefepime to 2gm q24h for worsening renal fx This pm change Cefepime 2gm back to q12 with initiation of CRRT   Height: _0  (154.9 cm) Weight: 122 lb 2.2 oz (55.4 kg) IBW/kg (Calculated) : 47.8  Temp (24hrs), Avg:98.6 F (37 C), Min:94.4 F (34.7 C), Max:100.8 F (38.2 C)  Recent Labs  Lab 10/03/18 0210 10/03/18 0556 10/04/18 0514 10/05/18 0120 10/05/18 2313 10/06/18 0113 10/06/18 0136 10/06/18 0300 10/06/18 1153 10/07/18 0350 10/07/18 1209 10/08/18 0105 10/08/18 0335  WBC 24.2*  --  20.4*  --   --   --  20.9*  --  21.2* 20.1*  --   --  24.7*  CREATININE 1.17*  --  1.05* 1.57*  --   --   --  1.50*  --  1.75* 1.92*  --  2.42*  LATICACIDVEN 3.2* 3.1*  --   --  2.6* 1.2  --   --   --   --   --  1.2  --     Estimated Creatinine Clearance: 25.4 mL/min (A) (by C-G formula based on SCr of 2.42 mg/dL (H)).    Allergies  Allergen Reactions  . Heparin Other (See Comments)    Unknown reaction   Antimicrobials this admission: 8/30 Septra  x 1  8/30 Cefepime  >>  Biktarvy continued as PTA Azith qTues> Atrovaquone >>  Dose adjustments this admission: 9/1: Cefepime 2gm q12 > 24 (Cl < 29 ml/min)  Microbiology results: 8/31 CMV (quant): in process 8/31 Parvo (misc test): in process 8/31 EBV: in process 8/31 BAL: few GPC 8/31 pneumocystis smear: in process 8/29 Covid neg 8/29 BCx2: ngtd 8/26 BCx2: ng-final 8/25 MRSA PCR: neg  Thank you for allowing pharmacy to be a part of this patient's care.  Minda Ditto PharmD Pager [phone] 10/08/2018, 7:26 AM

## 2018-10-08 NOTE — Progress Notes (Signed)
Initial Nutrition Assessment  RD working remotely.   DOCUMENTATION CODES:   Not applicable  INTERVENTION:  - when TF feasible: recommend Nepro @ 35 ml/hr with 30 ml prostat BID. - this regimen will provide 1712 kcal, 98 grams protein, 1714 mg K, 705 mg Phos and 611 ml free water.    NUTRITION DIAGNOSIS:   Inadequate oral intake related to inability to eat as evidenced by NPO status.  GOAL:   Patient will meet greater than or equal to 90% of their needs  MONITOR:   Vent status, Labs, Weight trends, I & O's  REASON FOR ASSESSMENT:   Ventilator  ASSESSMENT:   33 y.o. female with medical history significant for advanced HIV, normocytic anemia, thrombocytopenia, and recent admission for sepsis d/t suspected pneumocystis pneumonia. She presented to the ED from the [facility] on 8/25 for evaluation of symptomatic anemia, hypotension, and SOB. She reported on and off symptoms of lightheadedness, generalized weakness, SOB, nonproductive cough over the last 3-4 days. She reported a single episode of fever with diaphoresis. She denied any chest pain, N/V, abdominal pain, diarrhea, or dysuria. She went to the [facility] for routine Nplate injection and transfusion of RBCs. She was therefore sent to the ED for possible need of central line and blood transfusion.  Patient was intubated yesterday at ~1035. Unable to see patient x2 attempts today d/t RT need earlier and sterile procedure currently underway. Flow sheet indicates that OGT was placed yesterday and is currently clamped. TF recommendations outlined above.   Per chart review, weight on admission (8/29) was 113 and weight appears to have been mainly stable over the past month. Weight on 6/26 was 125 lb. This indicates 12 lb weight loss (9.6% body weight) in 2 months; significant for time frame. Unable to determine if patient meets criteria for malnutrition without completing NFPE.   Per notes: - acute recurrent hypoxemic  respiratory failure - recurrent bilateral pulmonary infiltrates  - profound persistent thrombocytopenia  - symptomatic anemia - AKI - hyperkalemia, hypocalcemia - circulatory shock - HIV   Patient is currently intubated on ventilator support MV: 12 L/min Temp (24hrs), Avg:98.6 F (37 C), Min:97.3 F (36.3 C), Max:100.8 F (38.2 C) Propofol: none  Labs reviewed; CBGs: 124 and 160 mg/dl today, Na: 146 mmol/l, K: 6.1 mmol/l, Phos: 11.8 mg/dl, Mg: 2.8 mg/dl, Cl: 115 mmol/l, BUN: 95 mg/dl, creatinine: 2.73 mg/dl, Ca: 6 mg/dl, GFR: 26 ml/min, Alk Phos and AST elevated and trending up.  Medications reviewed; sliding scale novolog, 80 mg solu-medrol BID, 5 mg IV reglan x1 dose 8/31, 40 mg protonix per OGT/day, 30 g kayexalate x1 dose 8/31 and x1 dose 9/1, 10 g lokelma scheduled for 1 dose 9/1.  IVF; 150 mEq sodium bicarb-sterile water @ 100 ml/hr.  Drips; levo @ 30 mcg/min, vaso @ 0.03 units/min    NUTRITION - FOCUSED PHYSICAL EXAM:  unable to complete at this time.   Diet Order:   Diet Order            Diet NPO time specified  Diet effective now              EDUCATION NEEDS:   No education needs have been identified at this time  Skin:  Skin Assessment: Reviewed RN Assessment  Last BM:  9/1  Height:   Ht Readings from Last 1 Encounters:  10/06/18 5' 1"  (1.549 m)    Weight:   Wt Readings from Last 1 Encounters:  10/06/18 55.4 kg  Ideal Body Weight:  47.7 kg  BMI:  Body mass index is 23.08 kg/m.  Estimated Nutritional Needs:   Kcal:  1700 kcal  Protein:  83-100 grams (1.5-1.8 grams/kg)  Fluid:  >/= 2 L/day     Jarome Matin, MS, RD, LDN, [facility] Inpatient Clinical Dietitian Pager # [phone] After hours/weekend pager # [phone]

## 2018-10-08 NOTE — Procedures (Signed)
Hemodialysis Catheter Insertion Procedure Note Krystal Short [id number] 11/02/1985  Procedure: Insertion of Hemodialysis Catheter Indications: Dialysis Access   Procedure Details Consent: Risks of procedure as well as the alternatives and risks of each were explained to the (patient/caregiver).  Consent for procedure obtained.   Time Out: Verified patient identification, verified procedure, site/side was marked, verified correct patient position, special equipment/implants available, medications/allergies/relevent history reviewed, required imaging and test results available.  Performed  Maximum sterile technique was used including antiseptics, cap, gloves, gown, hand hygiene, mask and sheet. Skin prep: Chlorhexidine; local anesthetic administered Triple lumen hemodialysis catheter was inserted into left internal jugular vein using the Seldinger technique.  Evaluation Blood flow good Complications: No apparent complications Patient did tolerate procedure well. Chest X-ray ordered to verify placement.  CXR: pending.   Procedure performed under direct supervision of Dr. Nelda Marseille and with ultrasound guidance for real time vessel cannulation.      Noe Gens, NP-C [facility] Pgr: [phone] or if no answer [phone] 10/08/2018, 1:26 PM

## 2018-10-08 NOTE — Progress Notes (Addendum)
PULMONARY / CRITICAL CARE MEDICINE   NAME:  Krystal Short, MRN:  [id number], DOB:  [birth year], LOS: 2 ADMISSION DATE:  10/05/2018, CONSULTATION DATE:  8/30 REFERRING MD:  Norins/ Triad, CHIEF COMPLAINT:  sob  BRIEF HISTORY:    33 y/o F, former smoker (quit 2018) admitted 8/29 with progressive shortness of breath.  She has had a difficult course with multiple admissions in the last year for waxing / waning pulmonary infiltrates (negative FOB, no PCP sent but treated as such), thrombocytopenia and failure to thrive.  Developed worsening shortness of breath and PCCM consulted on 8/30.  Decompensated 8/31 requiring intubation.  FOB performed.   RECENT EVALUATION  06/14/2018-07/16/2018 Bordetella bronchus infection.  Underwent FOB 07/12/2018 s PCP studies Biopsy scant lung parenchyma with fibrosis and reactive changes. Required 4 PRBC and 3 platelet infusion.  07/31/2026-08/22/2018 sepsis from suspected PJP pneumonia.  Treated with steroids clindamycin and primaquine. Required 8 PRBC and 7 platelet transfusion.  RVP positive for routine coronavirus.  09/03/2018-09/13/2018 generalized weakness and deconditioning.  Required 3 PRBC 8 platelet transfusion.  09/05/2018 IVIG without any improvement in platelet count.  Given prolonged prednisone taper.  09/11/18 BM Bx all micro studies neg   10/01/2018-10/04/2018 presented with anemia and poor IV access.  Given 2 PRBC 1 platelet transfusion.  Required pressors.  Treated with ceftriaxone and doxy for ? CAP.  SIGNIFICANT EVENTS:  8/29 Admit  8/30 PCCM consulted, started solumedrol 8/30 am @ 80 mg q 12h  8/31 Intubated, FOB  STUDIES:      CULTURES:  COVID 19 pcr 8/29 >> neg BCx 2 8/29 >> PCP 8/31 >>  ANTIBIOTICS  Bactrim 8/29 >>  Maxepime 8/29 >>  LINES/TUBES:  R IJ CVL in ER  8/30 >>  CONSULTANTS:  PCCM  8/30 ID 8/30   SUBJECTIVE:  RN reports worsening hypotension, levophed at 30 mcg's. 1.7L positive in last 24 hours. Husband reports that she was  denied for life insurance in 2012 > feels this might have been related to HIV but she was not forthcoming with family regarding why.    CONSTITUTIONAL: BP (!) 111/52   Pulse (!) 108   Temp 98.6 F (37 C)   Resp (!) 32   Ht _0  (1.549 m)   Wt 55.4 kg   LMP  (LMP Unknown) Comment: pt is on depo  SpO2 99%   BMI 23.08 kg/m   I/O last 3 completed shifts: In: 2809 [I.V.:1017.4; Blood:591; IV Piggyback:1200.6] Out: 525 [Urine:525]   Vent Mode: PRVC FiO2 (%):  [60 %-100 %] 100 % Set Rate:  [30 bmp] 30 bmp Vt Set:  [380 mL-420 mL] 420 mL PEEP:  [10 cmH20-12 cmH20] 10 cmH20 Plateau Pressure:  [24 GYI94-85 cmH20] 24 cmH20  PHYSICAL EXAM: General: young adult female lying in bed, critically ill appearing HEENT: MM pink/moist, ETT with bloody secretions in tubing, nasal flaring  Neuro: on precedex, turned off for hypotension, no response to verbal interaction  CV: s1s2 rrr, no m/r/g PULM:  Tachypnea, accessory muscle use on vent, lungs bilaterally with coarse crackles  GI: soft, bsx4 active  Extremities: warm/dry, trace to 1+ edema  Skin: no rashes or lesions   Lab Results  Component Value Date   ESRSEDRATE 100 (H) 10/02/2018   ESRSEDRATE >140 (H) 08/05/2018   ESRSEDRATE 140 (H) 08/02/2018    ASSESSMENT AND PLAN    Acute Recurrent Hypoxemic Respiratory Failure  -in setting of diffuse recurrent pulmlnary infiltrates  P: PRVC Vt 420, increase rate to 34 Wean  PEEP / FiO2 for sats >90% Follow CXR  Assess CVP Solumedrol 80 mg IV Q12  Recurrent Bilateral Pulmonary Infiltrates  -flared with pred taper despite ? max maint rx for PCP  P: Follow BAL for PJP ABX per ID  Await cultures   Profound Persistent Thrombocytopenia  -despite "nl bone marrow" suggests consumption issue > hematology following ? Steroid responsive (if so just setting her up longer term for more opportunistic organisms P: Heme/ONC following  Steroids initially started for possible sarcoid but no  granulomas on prior biospy  Symptomatic Anemia  -with nl BM hematopoesis suggests peripheral consumption  P: Follow CBC  Transfuse per ICU guidelines   AKI AGMA Hyperkalemia  Hypocalcemia  P: Nephrology consulted for evaluation  Place HD cathter Repeat BMP now > 9/1 0956 draw was during push of bicarb / inaccurate results  Assess urine sodium, creatinine  Assess Renal US  Trend BMP / urinary output Replace electrolytes as indicated Avoid nephrotoxic agents, ensure adequate renal perfusion Partly related to vol depletion vs drug side effects, no evidence of a pulmonary/renal syndrome   Circulatory Shock -some component of sedation, +/- sepsis  P: Assess CVP  Levophed for MAP > 65  Add vasopressin  Consider transition to stress dose steroids   HIV  P: Per ID    GOC:  P: Husband updated 9/1 at length at bedside. Reviewed nature of critical illness and possible arrest if she continues on current trajectory. He indicates that she would want all aggressive measures if she is neurologically intact.  Mother is pending arrival.  Will continue to update family.    LABS  Glucose Recent Labs  Lab 10/07/18 1118 10/07/18 1552 10/07/18 1933 10/07/18 2331 10/08/18 0350 10/08/18 0757  GLUCAP 126* 116* 109* 135* 124* 160*    BMET Recent Labs  Lab 10/07/18 0350 10/07/18 1209 10/08/18 0335  NA 139 139 144  K 5.5* 6.5* 7.0*  CL 114* 113* 116*  CO2 15* 16* 14*  BUN 76* 79* 92*  CREATININE 1.75* 1.92* 2.42*  GLUCOSE 136* 149* 157*   Liver Enzymes Recent Labs  Lab 10/04/18 0514 10/05/18 0120 10/07/18 1209  AST 18 28 119*  ALT _0 ALKPHOS 125 191* 187*  BILITOT 1.6* 3.1* 3.5*  ALBUMIN 1.7* 1.6* 2.0*   Electrolytes Recent Labs  Lab 10/03/18 0210 10/04/18 0514  10/07/18 0350 10/07/18 1209 10/08/18 0335  CALCIUM 7.4* 7.5*   < > 7.2* 6.9* 6.3*  MG 1.4* 2.3  --   --   --  2.8*  PHOS  --   --   --   --   --  11.8*   < > = values in this interval not  displayed.   CBC Recent Labs  Lab 10/06/18 1153 10/06/18 2000 10/07/18 0350 10/08/18 0335  WBC 21.2*  --  20.1* 24.7*  HGB 6.8* 12.0 12.3 9.8*  HCT 21.5* 36.0 36.6 29.6*  PLT 17*  --  7* 12*   ABG Recent Labs  Lab 10/08/18 0020 10/08/18 0235 10/08/18 0500  PHART 7.132* 7.118* 7.082*  PCO2ART 41.7 43.5 48.2*  PO2ART 59.6* 97.7 92.1   Coag's Recent Labs  Lab 10/01/18 1832 10/05/18 0120 10/07/18 1800  APTT 43*  --   --   INR 1.3* 1.4* 1.9*   Sepsis Markers Recent Labs  Lab 10/02/18 1112  10/05/18 2313 10/06/18 0113 10/08/18 0105  LATICACIDVEN  --    < > 2.6* 1.2 1.2  PROCALCITON 12.26  --   --   --   --    < > =  values in this interval not displayed.   Cardiac Enzymes No results for input(s): TROPONINI, PROBNP in the last 168 hours.   CRITICAL CARE Performed by: Noe Gens   Total critical care time: 60 minutes  Critical care time was exclusive of separately billable procedures and treating other patients.  Critical care was necessary to treat or prevent imminent or life-threatening deterioration.  Critical care was time spent personally by me on the following activities: development of treatment plan with patient and/or surrogate as well as nursing, discussions with consultants, evaluation of patient's response to treatment, examination of patient, obtaining history from patient or surrogate, ordering and performing treatments and interventions, ordering and review of laboratory studies, ordering and review of radiographic studies, pulse oximetry and re-evaluation of patient's condition.  Noe Gens, NP-C [facility] Pgr: [phone] or if no answer [phone] 10/08/2018, 9:42 AM  Attending Note:  33 year old female with extensive HIV who presents to PCCM with respiratory failure and AMS.  Patient was intubated on 8/31 for hypoxemia and inability to protect her airway.  On exam, she is unresponsive and agonally breathing.  I reviewed  CXR myself, ETT is in a good position and infiltrate in place.  Discussed with PCCM-NP and nephrology.  Will place HD catheter.  Start CRRT.  Spoke extensively to husband and to ID.  Will proceed with dialysis and continue supportive care for now.  Full code status.  The patient is critically ill with multiple organ systems failure and requires high complexity decision making for assessment and support, frequent evaluation and titration of therapies, application of advanced monitoring technologies and extensive interpretation of multiple databases.   Critical Care Time devoted to patient care services described in this note is  37  Minutes. This time reflects time of care of this signee Dr Jennet Maduro. This critical care time does not reflect procedure time, or teaching time or supervisory time of PA/NP/Med student/Med Resident etc but could involve care discussion time.  Rush Farmer, M.D. [facility] Pulmonary/Critical Care Medicine. Pager: [phone]. After hours pager: [phone].

## 2018-10-08 NOTE — Progress Notes (Signed)
CRITICAL VALUE ALERT  Critical Value:  Calcium 5.9  Date & Time Notied:  7:26 PM 10/08/2018   Provider Notified: night shift RN to notify Telehealth doc   Orders Received/Actions taken: report given to night shift RN R. Rosana Hoes

## 2018-10-09 ENCOUNTER — Inpatient Hospital Stay (HOSPITAL_COMMUNITY): Payer: 59

## 2018-10-09 ENCOUNTER — Telehealth: Payer: Self-pay | Admitting: Internal Medicine

## 2018-10-09 DIAGNOSIS — G9340 Encephalopathy, unspecified: Secondary | ICD-10-CM

## 2018-10-09 DIAGNOSIS — B081 Molluscum contagiosum: Secondary | ICD-10-CM

## 2018-10-09 LAB — INTERLEUKIN-6, PLASMA: Interleukin-6, Plasma: 771.6 pg/mL — ABNORMAL HIGH (ref 0.0–12.2)

## 2018-10-09 LAB — GLUCOSE, CAPILLARY
Glucose-Capillary: 102 mg/dL — ABNORMAL HIGH (ref 70–99)
Glucose-Capillary: 103 mg/dL — ABNORMAL HIGH (ref 70–99)
Glucose-Capillary: 104 mg/dL — ABNORMAL HIGH (ref 70–99)
Glucose-Capillary: 112 mg/dL — ABNORMAL HIGH (ref 70–99)
Glucose-Capillary: 123 mg/dL — ABNORMAL HIGH (ref 70–99)
Glucose-Capillary: 126 mg/dL — ABNORMAL HIGH (ref 70–99)

## 2018-10-09 LAB — RENAL FUNCTION PANEL
Albumin: 1.8 g/dL — ABNORMAL LOW (ref 3.5–5.0)
Albumin: 1.7 g/dL — ABNORMAL LOW (ref 3.5–5.0)
Anion gap: 11 (ref 5–15)
Anion gap: 11 (ref 5–15)
BUN: 47 mg/dL — ABNORMAL HIGH (ref 6–20)
BUN: 33 mg/dL — ABNORMAL HIGH (ref 6–20)
CO2: 23 mmol/L (ref 22–32)
CO2: 24 mmol/L (ref 22–32)
Calcium: 6.7 mg/dL — ABNORMAL LOW (ref 8.9–10.3)
Calcium: 6.9 mg/dL — ABNORMAL LOW (ref 8.9–10.3)
Chloride: 105 mmol/L (ref 98–111)
Chloride: 104 mmol/L (ref 98–111)
Creatinine, Ser: 1.35 mg/dL — ABNORMAL HIGH (ref 0.44–1.00)
Creatinine, Ser: 1.1 mg/dL — ABNORMAL HIGH (ref 0.44–1.00)
GFR calc Af Amer: >60 mL/min (ref >60)
GFR calc Af Amer: >60 mL/min (ref >60)
GFR calc non Af Amer: 52 mL/min — ABNORMAL LOW (ref >60)
GFR calc non Af Amer: >60 mL/min (ref >60)
Glucose, Bld: 138 mg/dL — ABNORMAL HIGH (ref 70–99)
Glucose, Bld: 128 mg/dL — ABNORMAL HIGH (ref 70–99)
Phosphorus: 5.7 mg/dL — ABNORMAL HIGH (ref 2.5–4.6)
Phosphorus: 4.3 mg/dL (ref 2.5–4.6)
Potassium: 4.8 mmol/L (ref 3.5–5.1)
Potassium: 4.6 mmol/L (ref 3.5–5.1)
Sodium: 139 mmol/L (ref 135–145)
Sodium: 139 mmol/L (ref 135–145)

## 2018-10-09 LAB — BLOOD GAS, ARTERIAL
Acid-Base Excess: 0.1 mmol/L (ref 0.0–2.0)
Bicarbonate: 25.7 mmol/L (ref 20.0–28.0)
Drawn by: 441261
FIO2: 100
MECHVT: 420 mL
O2 Saturation: 81.9 %
PEEP: 10 cmH2O
Patient temperature: 98.6
RATE: 34 resp/min
pCO2 arterial: 49.9 mmHg — ABNORMAL HIGH (ref 32.0–48.0)
pH, Arterial: 7.332 — ABNORMAL LOW (ref 7.350–7.450)
pO2, Arterial: 50.9 mmHg — ABNORMAL LOW (ref 83.0–108.0)

## 2018-10-09 LAB — BPAM PLATELET PHERESIS
Blood Product Expiration Date: 202009021139
ISSUE DATE / TIME: 202009011146
Unit Type and Rh: 5100

## 2018-10-09 LAB — APTT: aPTT: 43 seconds — ABNORMAL HIGH (ref 24–36)

## 2018-10-09 LAB — EPSTEIN BARR VRS(EBV DNA BY PCR)
EBV DNA QN by PCR: 1033 copies/mL
log10 EBV DNA Qn PCR: 3.014 log10 copy/mL

## 2018-10-09 LAB — CBC
HCT: 23.3 % — ABNORMAL LOW (ref 36.0–46.0)
Hemoglobin: 8 g/dL — ABNORMAL LOW (ref 12.0–15.0)
MCH: 30.2 pg (ref 26.0–34.0)
MCHC: 34.3 g/dL (ref 30.0–36.0)
MCV: 87.9 fL (ref 80.0–100.0)
Platelets: 7 10*3/uL — CL (ref 150–400)
RBC: 2.65 MIL/uL — ABNORMAL LOW (ref 3.87–5.11)
RDW: 18.5 % — ABNORMAL HIGH (ref 11.5–15.5)
WBC: 13 10*3/uL — ABNORMAL HIGH (ref 4.0–10.5)
nRBC: 13.7 % — ABNORMAL HIGH (ref 0.0–0.2)

## 2018-10-09 LAB — CMV DNA, QUANTITATIVE, PCR
CMV DNA Quant: 144000 IU/mL
Log10 CMV Qn DNA Pl: 5.158 log10 IU/mL

## 2018-10-09 LAB — PREPARE PLATELET PHERESIS: Unit division: 0

## 2018-10-09 LAB — POTASSIUM: Potassium: 4.8 mmol/L (ref 3.5–5.1)

## 2018-10-09 LAB — MAGNESIUM: Magnesium: 2.4 mg/dL (ref 1.7–2.4)

## 2018-10-09 MED ORDER — SODIUM CHLORIDE 0.9% IV SOLUTION: Freq: Once | INTRAVENOUS | Status: DC

## 2018-10-09 MED ORDER — GUAIFENESIN-DM 100-10 MG/5ML PO SYRP
7.5000 mL | ORAL_SOLUTION | Freq: Four times a day (QID) | ORAL | Status: DC
  Administered 2018-10-10 – 2018-10-23 (×46): 7.5 mL
  Filled 2018-10-09 (×47): qty 10

## 2018-10-09 MED ORDER — SODIUM CHLORIDE 0.9 % IV SOLN: INTRAVENOUS | Status: DC | PRN

## 2018-10-09 NOTE — Progress Notes (Signed)
2 RT's attempted to obtain ABG- unsuccessful. MD aware and has ordered arterial line insertion that he will attempt. Left labeled Arterial line and dressings in PT room. RN aware.

## 2018-10-09 NOTE — Progress Notes (Addendum)
NAMERachna Short, MRN:  [id number], DOB:  1985/04/27, LOS: 3 ADMISSION DATE:  10/05/2018, CONSULTATION DATE:  8/30 REFERRING MD:  Dr. Vicente Masson, CHIEF COMPLAINT:  SOB   Brief History   33 y/o F, former smoker (quit 2018) admitted 8/29 with progressive shortness of breath.  She has had a difficult course with multiple admissions in the last year for waxing / waning pulmonary infiltrates (negative FOB, no PCP sent but treated as such), thrombocytopenia and failure to thrive.  Developed worsening shortness of breath and PCCM consulted on 8/30.  Decompensated 8/31 requiring intubation.  FOB performed and results currently pending. Due to worsening AKI with metabolic acidosis CRRT was initiated 9/1.    Recent Evaluation   06/14/2018-07/16/2018 Bordetella bronchus infection. Underwent FOB 07/12/2018 s PCP studies Biopsy scant lung parenchyma with fibrosis and reactive changes. Required 4 PRBC and 3 platelet infusion.  07/31/2026-08/22/2018 sepsis from suspected PJP pneumonia. Treated with steroids clindamycin and primaquine. Required 8 PRBC and 7 platelet transfusion. RVP positive for routine coronavirus.  09/03/2018-09/13/2018 generalized weakness and deconditioning. Required 3 PRBC 8 platelet transfusion.  09/05/2018 IVIG without any improvement in platelet count. Given prolonged prednisone taper.  09/11/18 Bone marrow Bx all micro studies neg   10/01/2018-10/04/2018 presented with anemia and poor IV access. Given 2 PRBC 1 platelet transfusion. Required pressors. Treated with ceftriaxone and doxy for ? CAP.  SIGNIFICANT EVENTS:  8/29 Admit  8/30 PCCM consulted, started solumedrol 8/30 am @ 80 mg q 12h  8/31 Intubated, FOB  Significant Hospital Events   8/29 Admit  8/30 PCCM consulted, started solumedrol 8/30 am @ 80 mg q 12h  8/31 Intubated, FOB 9/1 Hemodialysis catheter inserted, CRRT initiated   Consults:  PCCM  8/30 ID 8/30  Nephrology 9/1  Procedures:  R IJ CVC 8/30 >> ETT  8/31 >> FOB 8/31 >> L HD cath 9/1 >>  Significant Diagnostic Tests:  Head CT 8/31 >> negative for any acute intracranial process   Micro Data:  COVID 19 pcr 8/29 >> neg BCx 2 8/29 >> no growth 2 days  PCP 8/31 >> neg 9/1 FOB BAL 8/31 >> Pending but 50,000 Coag neg staph   Antimicrobials:  Bactrim 8/29 >> 8/30 Maxepime 8/29 >> PO Atovaquone 8/30 >> PO Azithromycin 9/1 >>   Interim history/Subjective:  RN reports brief episode of bradycardia during suctioning and critical plt count of 7 prompting overnight MD to transfuse 1 unit plt. Hypothermia with initiation of CRRT corrected with [place] of warming blanket. 1.1L output with CRRT and 133m urine output. Remains on full vent support with 100 Fio2 with Fentanyl drip.  Objective   Blood pressure (!) 102/46, pulse (!) 55, temperature 98.2 F (36.8 C), resp. rate (!) 34, height _0  (1.549 m), weight 62 kg, SpO2 99 %. CVP:  [8 mmHg-9 mmHg] 8 mmHg  Vent Mode: PRVC FiO2 (%):  [100 %] 100 % Set Rate:  [34 bmp] 34 bmp Vt Set:  [420 mL] 420 mL PEEP:  [10 cmH20] 10 cmH20 Plateau Pressure:  [20 cmH20-44 cmH20] 44 cmH20   Intake/Output Summary (Last 24 hours) at 10/09/2018 [id number] data filed at 10/09/2018 0800 Gross per 24 hour  Intake 3535.87 ml  Output 1230 ml  Net 2305.87 ml   Filed Weights   10/05/18 2256 10/06/18 0941 10/09/18 0412  Weight: 51.3 kg 55.4 kg 62 kg    Examination: General: Young [place] lying in bed, critically ill appearing  HENT: MM pink and dry with slight  blood tinged secretions seen around mouth and in suctions tubing  Neuro: On fentanyl drip, appears slightly more comfortable on vent    Cardiovascular: s1s2 regular rate and rhythm, no murmur or rub  Pulm: Tachypnea, mild accessory muscle use on vent improved from day prior, bilaterally lung sounds with coarse crackles GI: OG tube in place, soft active bowel sounds on all quadrants  Extremities: Warm/dry, trace 1+ edema in all extremities   Skin: No rashes or lesions   Resolved Hospital Problem list     Assessment & Plan:  Acute Recurrent Hypoxemic Respiratory Failure  -in setting of diffuse recurrent pulmlnary infiltrates with history to waxing / waning pulmonary infiltrates  P: Remains on VT 421, with increased rate of 34 Currently on 100% FiO2 >> wean PEEP / FiO2 for sats > 90% Follow CRX CVP 8, follow  Solumedrol 80 mg IV q12  Recurrent Bilateral Pulmonary Infiltrates  -flared with pred taper despite ? max maint rx for PCP  P: BAL negative for PJP BAL pending but preliminary with 50,000 coloniesof coag ned staph   ABX per ID Await cultures   Profound Persistent Thrombocytopenia  -despite "nl bone marrow" suggests consumption issue > hematology following ? Steroid responsive (if so just setting her up longer term for more opportunistic organisms P: Has received 4 units plts thus far ID recommending lympe and lung biopsy if counts allow   Heme/ONC following  Continue steroids, initially started for possible sarcoid but no granulomas on prior biospy  Symptomatic Anemia  -with nl BM hematopoesis suggests peripheral consumption  P: Continue to follow CBC  Transfuse per ICU guidelines   AKI AGMA Hyperkalemia  Hypocalcemia  -R IJ HD cath inserted 9/1 and CRRT started 9/1. Likely related to vol depletion vs drug side effects, no evidence of a pulmonary/renal syndrome  P: Nephrology consulted 9/1 for evaluation  FENa 0.5% indicating pre-renal cause  Renal US pending  Continue to trend BMP/ renal output  Follow electrolytes and replace as needed   Ensure adequate renal profusion and avoid nephrotoxins   Circulatory Shock -some component of sedation, +/- sepsis  P: CVP 8, continue to follow   Vasopressin and Levophed for MAP > 65  Consider transition to stress dose steroids   HIV  P: Per ID, appreciate assistance   GOC:  P: Husband not at bedside this morning but will update later today  either in person or by phone. Husband and mother updated 9/1 at length at bedside with review of current nature of critical illness and possible arrest if she continues on current trajectory. He indicates that she would want all aggressive measures if she is neurologically intact.  Best practice:  Diet: NPO, will discuss possibility of initiating TF today Pain/Anxiety/Delirium protocol (if indicated): Fentanyl drip  VAP protocol (if indicated): In place  DVT prophylaxis: SCD's, allergy to heparin  GI prophylaxis: PPI Glucose control: N/A Mobility: BR Code Status: Full Family Communication: Will update husband at bedside/ over the phone today  Disposition: ICU  Labs   CBC: Recent Labs  Lab 10/03/18 0210 10/04/18 0514 10/06/18 0136  10/06/18 1153 10/06/18 2000 10/07/18 0350 10/08/18 0335 10/09/18 0417  WBC 24.2* 20.4* 20.9*  --  21.2*  --  20.1* 24.7* 13.0*  NEUTROABS 16.0* 12.5*  --   --   --   --   --   --   --   HGB 10.1* 8.5* 7.9*   < > 6.8* 12.0 12.3 9.8* 8.0*  HCT 31.8* 26.0*  24.6*   < > 21.5* 36.0 36.6 29.6* 23.3*  MCV 92.4 92.5 93.9  --  94.3  --  89.9 91.4 87.9  PLT 14* 13* 9*  --  17*  --  7* 12* 7*   < > = values in this interval not displayed.    Basic Metabolic Panel: Recent Labs  Lab 10/03/18 0210 10/04/18 0514  10/08/18 0335 10/08/18 0956 10/08/18 1053 10/08/18 1758 10/08/18 2102 10/09/18 0102 10/09/18 0417  NA 133* 134*   < > 144 177* 146* 142  --   --  139  K 4.4 4.1   < > 7.0* 5.8* 6.1* 4.9 4.7 4.8 4.8  CL 109 110   < > 116* 110 115* 107  --   --  105  CO2 11* 16*   < > 14* 49* 18* 22  --   --  23  GLUCOSE 143* 111*   < > 157* 123* 116* 116*  --   --  138*  BUN 35* 43*   < > 92* 89* 95* 82*  --   --  47*  CREATININE 1.17* 1.05*   < > 2.42* 2.40* 2.73* 2.14*  --   --  1.35*  CALCIUM 7.4* 7.5*   < > 6.3* 5.5* 6.0* 5.9*  --   --  6.7*  MG 1.4* 2.3  --  2.8*  --   --   --   --   --  2.4  PHOS  --   --   --  11.8*  --   --  9.4*  --   --  5.7*   < >  = values in this interval not displayed.   GFR: Estimated Creatinine Clearance: 51 mL/min (A) (by C-G formula based on SCr of 1.35 mg/dL (H)). Recent Labs  Lab 10/02/18 1112  10/03/18 0556  10/05/18 2313 10/06/18 0113  10/06/18 1153 10/07/18 0350 10/08/18 0105 10/08/18 0335 10/09/18 0417  PROCALCITON 12.26  --   --   --   --   --   --   --   --   --   --   --   WBC  --    < >  --    < >  --   --    < > 21.2* 20.1*  --  24.7* 13.0*  LATICACIDVEN  --    < > 3.1*  --  2.6* 1.2  --   --   --  1.2  --   --    < > = values in this interval not displayed.    Liver Function Tests: Recent Labs  Lab 10/04/18 0514 10/05/18 0120 10/07/18 1209 10/08/18 0956 10/08/18 1053 10/08/18 1758 10/09/18 0417  AST 18 28 119* 110* 129*  --   --   ALT _0 --   --   ALKPHOS 125 191* 187* 254* 291*  --   --   BILITOT 1.6* 3.1* 3.5* 3.7* 4.4*  --   --   PROT 6.2* 6.0* 6.0* 4.4* 5.1*  --   --   ALBUMIN 1.7* 1.6* 2.0* 1.4* 1.5* 1.8* 1.8*   No results for input(s): LIPASE, AMYLASE in the last 168 hours. No results for input(s): AMMONIA in the last 168 hours.  ABG    Component Value Date/Time   PHART 7.230 (L) 10/08/2018 1139   PCO2ART 38.6 10/08/2018 1139   PO2ART 86.7 10/08/2018 1139   HCO3 15.7 (L) 10/08/2018 1139  TCO2 15 (L) 10/06/2018 0300   ACIDBASEDEF 10.9 (H) 10/08/2018 1139   O2SAT 96.6 10/08/2018 1139     Coagulation Profile: Recent Labs  Lab 10/05/18 0120 10/07/18 1800  INR 1.4* 1.9*    Cardiac Enzymes: No results for input(s): CKTOTAL, CKMB, CKMBINDEX, TROPONINI in the last 168 hours.  HbA1C: Hgb A1c MFr Bld  Date/Time Value Ref Range Status  08/09/2018 02:28 AM 5.5 4.8 - 5.6 % Final    Comment:    (NOTE) Pre diabetes:          5.7%-6.4% Diabetes:              >6.4% Glycemic control for   <7.0% adults with diabetes   08/02/2018 06:46 PM 4.7 (L) 4.8 - 5.6 % Final    Comment:    (NOTE) Pre diabetes:          5.7%-6.4% Diabetes:              >6.4%  Glycemic control for   <7.0% adults with diabetes     CBG: Recent Labs  Lab 10/08/18 0757 10/08/18 1224 10/08/18 1728 10/09/18 0015 10/09/18 0737  GLUCAP 160* 96 101* 104* 112*    Critical care time:    CRITICAL CARE Performed by: Johnsie Cancel   Total critical care time: 45 minutes  Critical care time was exclusive of separately billable procedures and treating other patients.  Critical care was necessary to treat or prevent imminent or life-threatening deterioration.  Critical care was time spent personally by me on the following activities: development of treatment plan with patient and/or surrogate as well as nursing, discussions with consultants, evaluation of patient's response to treatment, examination of patient, obtaining history from patient or surrogate, ordering and performing treatments and interventions, ordering and review of laboratory studies, ordering and review of radiographic studies, pulse oximetry and re-evaluation of patient's condition.  Johnsie Cancel, NP-C [facility] Pgr: [phone] or if no answer [phone] 10/09/2018, 9:0 AM  Attending Note:  33 year old with HIV who presents with respiratory failure, renal failure and bone marrow failure.  She is on 100% with a PEEP of 10 at this point with poor oxygenation.  On exam, she opens eyes and grimaces to pain but not following commands with coarse BS diffusely.  I reviewed CXR myself, ETT is in a good position and infiltrate is noted.  Husband and mother updated bedside.  Concern here is that her platelet is very low and that she is requiring 100% FiO2.  Will stop fentanyl and re-evaluate mental status.  Will order a gass now to evaluate PaO2.  If mental status remains this poor with fentanyl off will consider a head CT.  Continue levo and vaso for BP support and CRRT for renal support.  PCCM will continue to manage.  The patient is critically ill with multiple organ systems failure  and requires high complexity decision making for assessment and support, frequent evaluation and titration of therapies, application of advanced monitoring technologies and extensive interpretation of multiple databases.   Critical Care Time devoted to patient care services described in this note is  33  Minutes. This time reflects time of care of this signee Dr Jennet Maduro. This critical care time does not reflect procedure time, or teaching time or supervisory time of PA/NP/Med student/Med Resident etc but could involve care discussion time.  Rush Farmer, M.D. [facility]. Pager: [phone]. After hours pager: [phone].

## 2018-10-09 NOTE — Progress Notes (Addendum)
Patient ID: Krystal Short, female   DOB: 03/22/1985, 33 y.o.   MRN: [id number]          [facility] for Infectious Disease  Date of Admission:  10/05/2018           Day 4 cefepime ASSESSMENT: The cause of her progressive multisystem illness remains uncertain.  Her pneumocystis antigen is negative.  I do not think that her recurrent respiratory illnesses are due to recurrent pneumocystis pneumonia.  Over the past several months several different potential pathogens have been identified including Bordetella bronchiseptica, routine coronavirus now, coagulase-negative staph.  She is also had low level viremia with EBV and CMV.  I doubt that any of these agents are responsible for her progressive multisystem illness.  I will continue cefepime for now.  If her thrombocytopenia improves I would consider lung and/or lymph node biopsies.  PLAN: 1. Continue cefepime  2. Continue Biktarvy, atovaquone and azithromycin  Active Problems:   Pulmonary infiltrates   Lymphadenopathy, generalized   Acute respiratory failure with hypoxemia (HCC)   Symptomatic anemia   Thrombocytopenia (HCC)   HIV disease (HCC)   Molluscum contagiosum   ASCUS with positive high risk HPV cervical   AKI (acute kidney injury) ([place])   Elevated bilirubin   Anemia   Elevated liver enzymes   AIDS (acquired immune deficiency syndrome) (HCC)   ARDS (adult respiratory distress syndrome) (HCC)   Scheduled Meds:  sodium chloride   Intravenous Once   sodium chloride   Intravenous Once   atovaquone  1,500 mg Oral Daily   azithromycin  1,200 mg Oral Q Tue   benzonatate  100 mg Oral TID   chlorhexidine  15 mL Mouth Rinse BID   chlorhexidine gluconate (MEDLINE KIT)  15 mL Mouth Rinse BID   Chlorhexidine Gluconate Cloth  6 each Topical Daily   dextromethorphan-guaiFENesin  1 tablet Oral BID   emtricitabine-tenofovir AF  1 tablet Per Tube Daily   And   dolutegravir  50 mg Oral Daily   insulin aspart  2-6 Units  Subcutaneous Q4H   mouth rinse  15 mL Mouth Rinse 10 times per day   methylPREDNISolone (SOLU-MEDROL) injection  80 mg Intravenous Q12H   metoCLOPramide (REGLAN) injection  5 mg Intravenous Once   mometasone-formoterol  2 puff Inhalation BID   pantoprazole sodium  40 mg Per Tube Daily   sodium chloride flush  10-40 mL Intracatheter Q12H   Continuous Infusions:   prismasol BGK 4/2.5 500 mL/hr at 10/08/18 2109    prismasol BGK 4/2.5 300 mL/hr at 10/09/18 1300   sodium chloride 10 mL/hr at 10/09/18 0858   sodium chloride     anticoagulant sodium citrate     ceFEPime (MAXIPIME) IV Stopped (10/09/18 0537)   dexmedetomidine (PRECEDEX) IV infusion Stopped (10/08/18 0945)   fentaNYL infusion INTRAVENOUS Stopped (10/09/18 1107)   norepinephrine (LEVOPHED) Adult infusion 15 mcg/min (10/09/18 1600)   prismasol BGK 4/2.5 1,800 mL/hr at 10/09/18 1628   vasopressin (PITRESSIN) infusion - *FOR SHOCK* 0.03 Units/min (10/09/18 1600)   PRN Meds:.sodium chloride, Place/Maintain arterial line **AND** sodium chloride, acetaminophen (TYLENOL) oral liquid 160 mg/5 mL, albuterol, alteplase, anticoagulant sodium citrate, fentaNYL (SUBLIMAZE) injection, midazolam, morphine injection, sodium chloride, sodium chloride flush   SUBJECTIVE: Krystal Short is now in her eighth hospitalization since May of last year.  She has had similar presentations each time.  She has had progressively worsening pulmonary infiltrates leading to respiratory failure, symptomatic anemia, severe thrombocytopenia, generalized lymphadenopathy and intermittent fevers.  He  was discharged on 10/04/2018 but readmitted 2 days later with respiratory failure.  She is intubated.  BAL Gram stain shows gram-positive cocci.  Culture is pending.  Pneumocystis smear is pending.  Her liver enzymes and bilirubin are now.  Her LDH is 735 and her ferritin is 9379.  She is undergone bone marrow exam in May and August of this year.  Both times it  showed hypercellular marrow with trilineage hematopoiesis.  AFB and fungal cultures were negative.  She underwent bronchoscopy with lung biopsy in June.  Only scant lung parenchyma was seen.  There were no granulomata.  No pneumocystis smear was obtained.  Her HIV has been fully suppressed on Biktarvy but she has not had significant CD4 reconstitution.  She has been on atovaquone for pneumocystis prophylaxis and azithromycin for Mycobacterium avium prophylaxis.  Review of Systems: Review of Systems  Unable to perform ROS: Intubated    Allergies  Allergen Reactions   Heparin Other (See Comments)    Unknown reaction    OBJECTIVE: Vitals:   10/09/18 1400 10/09/18 1500 10/09/18 1518 10/09/18 1600  BP:  (!) 88/48  (!) 102/39  Pulse: 82   79  Resp: (!) 34 (!) 34  (!) 34  Temp:    97.7 F (36.5 C)  TempSrc:      SpO2: 100%  96% 100%  Weight:      Height:       Body mass index is 25.83 kg/m.  Physical Exam Constitutional:      Comments: She is sedated on the ventilator.  Cardiovascular:     Rate and Rhythm: Normal rate and regular rhythm.     Heart sounds: No murmur. No friction rub.  Pulmonary:     Breath sounds: Rales present. No wheezing.     Comments: She has bloody secretions in her endotracheal tube. Abdominal:     General: There is distension.     Comments: Absent bowel sounds.  Skin:    Comments: Papules consistent with molluscum contagiosum on face.     Lab Results Lab Results  Component Value Date   WBC 13.0 (H) 10/09/2018   HGB 8.0 (L) 10/09/2018   HCT 23.3 (L) 10/09/2018   MCV 87.9 10/09/2018   PLT 7 (LL) 10/09/2018    Lab Results  Component Value Date   CREATININE 1.35 (H) 10/09/2018   BUN 47 (H) 10/09/2018   NA 139 10/09/2018   K 4.8 10/09/2018   CL 105 10/09/2018   CO2 23 10/09/2018    Lab Results  Component Value Date   ALT 24 10/08/2018   AST 129 (H) 10/08/2018   ALKPHOS 291 (H) 10/08/2018   BILITOT 4.4 (H) 10/08/2018      Microbiology: Recent Results (from the past 240 hour(s))  Blood Culture (routine x 2)     Status: None   Collection Time: 10/01/18  6:32 PM   Specimen: BLOOD RIGHT ARM  Result Value Ref Range Status   Specimen Description   Final    BLOOD RIGHT ARM Performed at [facility], [place] [address] [place], [place] [address]    Special Requests   Final    BOTTLES DRAWN AEROBIC AND ANAEROBIC Blood Culture adequate volume Performed at [place] [address] [place], [place] [address]    Culture   Final    NO GROWTH 5 DAYS Performed at [facility], [place] [address], [place], [place] [address]    Report Status 10/06/2018 FINAL  Final  Urine culture     Status: Abnormal   Collection Time: 10/01/18  6:32 PM   Specimen: In/Out Cath Urine  Result Value Ref Range Status   Specimen Description   Final    IN/OUT CATH URINE Performed at [place] [address], [place], [place] [address]    Special Requests   Final    NONE Performed at [facility], [place] [address], [place], [place] [address]    Culture (A)  Final    <10,000 COLONIES/mL INSIGNIFICANT GROWTH Performed at [place] [address] [place], [place] [address]    Report Status 10/02/2018 FINAL  Final  Novel Coronavirus, NAA (Hosp order, Send-out to Ref Lab; TAT 18-24 hrs     Status: None   Collection Time: 10/01/18  6:55 PM  Result Value Ref Range Status   SARS-CoV-2, NAA NOT DETECTED NOT DETECTED Final    Comment: (NOTE) This test was developed and its performance characteristics determined by [employer]. This test has not been FDA cleared or approved. This test has been authorized by FDA under an Emergency Use Authorization (EUA). This test is only authorized for the duration of time the declaration that circumstances exist justifying the authorization of the emergency use of in vitro diagnostic tests for  detection of SARS-CoV-2 virus and/or diagnosis of COVID-19 infection under section 564(b)(1) of the Act, 21 U.S.C. 341PFX-9(K)(2), unless the authorization is terminated or revoked sooner. When diagnostic testing is negative, the possibility of a false negative result should be considered in the context of a patient's recent exposures and the presence of clinical signs and symptoms consistent with COVID-19. An individual without symptoms of COVID-19 and who is not shedding SARS-CoV-2 virus would expect to have a negative (not detected) result in this assay. Performed  At: [facility] [place], [place] [id number] Rush Farmer MD JM:[id number]    [place]  Final    Comment: Performed at [place] [address], [place], [place] [address]  MRSA PCR Screening     Status: None   Collection Time: 10/01/18 10:44 PM   Specimen: Nasal Mucosa; Nasopharyngeal  Result Value Ref Range Status   MRSA by PCR NEGATIVE NEGATIVE Final    Comment:        The GeneXpert MRSA Assay (FDA approved for NASAL specimens only), is one component of a comprehensive MRSA colonization surveillance program. It is not intended to diagnose MRSA infection nor to guide or monitor treatment for MRSA infections. Performed at [facility], [place] [address], [place], [place] [address]   Blood Culture (routine x 2)     Status: None   Collection Time: 10/02/18 11:00 AM   Specimen: BLOOD RIGHT HAND  Result Value Ref Range Status   Specimen Description   Final    BLOOD RIGHT HAND Performed at [place] [address], [place], [place] [address]    Special Requests   Final    BOTTLES DRAWN AEROBIC AND ANAEROBIC Blood Culture adequate volume Performed at [place] [address], [place], [place] [address]    Culture   Final    NO GROWTH 5 DAYS Performed at [facility],  [place] [address] [place],  [address]    Report Status 10/07/2018 FINAL  Final  Culture, blood (Routine x 2)     Status: None (Preliminary result)   Collection Time: 10/05/18  1:20  AM   Specimen: BLOOD  Result Value Ref Range Status   Specimen Description   Final    BLOOD PICC LINE Performed at [place] [address] [place], [place] [address]    Special Requests   Final    BOTTLES DRAWN AEROBIC AND ANAEROBIC Blood Culture adequate volume Performed at [place] [address] [place], [place] [address]    Culture   Final    NO GROWTH 3 DAYS Performed at [facility], [place] [address], [place], [place] [address]    Report Status PENDING  Incomplete  Culture, blood (Routine x 2)     Status: None (Preliminary result)   Collection Time: 10/05/18 11:13 PM   Specimen: BLOOD  Result Value Ref Range Status   Specimen Description   Final    BLOOD LEFT ARM Performed at [place] [address] [place], [place] [address]    Special Requests   Final    BOTTLES DRAWN AEROBIC AND ANAEROBIC Blood Culture adequate volume Performed at [place] [address] [place], [place] [address]    Culture   Final    NO GROWTH 3 DAYS Performed at [facility], [place] [address], [place], [place] [address]    Report Status PENDING  Incomplete  SARS Coronavirus 2 [facility] order, Performed in [facility] lab) Nasopharyngeal Nasopharyngeal Swab     Status: None   Collection Time: 10/05/18 11:22 PM   Specimen: Nasopharyngeal Swab  Result Value Ref Range Status   SARS Coronavirus 2 NEGATIVE NEGATIVE Final    Comment: (NOTE) If result is NEGATIVE SARS-CoV-2 target nucleic acids are NOT DETECTED. The SARS-CoV-2 RNA is generally detectable in upper and lower  respiratory specimens during the acute phase of infection. The lowest  concentration of SARS-CoV-2 viral copies this assay can detect  is 250  copies / mL. A negative result does not preclude SARS-CoV-2 infection  and should not be used as the sole basis for treatment or other  patient management decisions.  A negative result may occur with  improper specimen collection / handling, submission of specimen other  than nasopharyngeal swab, presence of viral mutation(s) within the  areas targeted by this assay, and inadequate number of viral copies  (<250 copies / mL). A negative result must be combined with clinical  observations, patient history, and epidemiological information. If result is POSITIVE SARS-CoV-2 target nucleic acids are DETECTED. The SARS-CoV-2 RNA is generally detectable in upper and lower  respiratory specimens dur ing the acute phase of infection.  Positive  results are indicative of active infection with SARS-CoV-2.  Clinical  correlation with patient history and other diagnostic information is  necessary to determine patient infection status.  Positive results do  not rule out bacterial infection or co-infection with other viruses. If result is PRESUMPTIVE POSTIVE SARS-CoV-2 nucleic acids MAY BE PRESENT.   A presumptive positive result was obtained on the submitted specimen  and confirmed on repeat testing.  While 2019 novel coronavirus  (SARS-CoV-2) nucleic acids may be present in the submitted sample  additional confirmatory testing may be necessary for epidemiological  and / or clinical management purposes  to differentiate between  SARS-CoV-2 and other Sarbecovirus currently known to infect humans.  If clinically indicated additional testing with an alternate test  methodology [phone]) is advised. The SARS-CoV-2 RNA is generally  detectable in upper and lower respiratory sp ecimens during the acute  phase  of infection. The expected result is Negative. Fact Sheet for Patients:  StrictlyIdeas.no Fact Sheet for Healthcare  Providers: BankingDealers.co.za This test is not yet approved or cleared by the [place] FDA and has been authorized for detection and/or diagnosis of SARS-CoV-2 by FDA under an Emergency Use Authorization (EUA).  This EUA will remain in effect (meaning this test can be used) for the duration of the COVID-19 declaration under Section 564(b)(1) of the Act, 21 U.S.C. section 360bbb-3(b)(1), unless the authorization is terminated or revoked sooner. Performed at [facility], [place] [address], [place], [place] [address]   Culture, bal-quantitative     Status: Abnormal (Preliminary result)   Collection Time: 10/07/18 10:16 AM   Specimen: Bronchoalveolar Lavage; Respiratory  Result Value Ref Range Status   Specimen Description   Final    BRONCHIAL ALVEOLAR LAVAGE Performed at [place] [address] [place], [place] [address]    Special Requests   Final    Immunocompromised Performed at [facility], [place] [address], [place], [place] [address]    Gram Stain   Final    FEW SQUAMOUS EPITHELIAL CELLS PRESENT MODERATE WBC PRESENT, PREDOMINANTLY MONONUCLEAR FEW GRAM POSITIVE COCCI    Culture (A)  Final    50,000 COLONIES/mL STAPHYLOCOCCUS SPECIES (COAGULASE NEGATIVE) SUSCEPTIBILITIES TO FOLLOW Performed at [facility], [address] [place], [place] [address]    Report Status PENDING  Incomplete  Pneumocystis smear by DFA     Status: None   Collection Time: 10/07/18 10:16 AM   Specimen: Bronchial Alveolar Lavage; Respiratory  Result Value Ref Range Status   Specimen Source-PJSRC BRONCHIAL ALVEOLAR LAVAGE  Final   Pneumocystis jiroveci Ag NEGATIVE  Final    Comment: Performed at [place] Performed at [place] [address], [place], [place] [address]     Michel Bickers, [place] [facility] [phone] pager   [phone] cell 10/09/2018, 5:07 PM

## 2018-10-09 NOTE — Telephone Encounter (Signed)
Recv'd records from [facility]  forwarded 2 pages to Dr. Melvyn Novas 9/2/20fbg

## 2018-10-09 NOTE — Progress Notes (Signed)
[facility] Progress Note  Subjective: on vent, on CRRT no issues overnight w/ filters - remains on pressors x 2 - FiO2 100% - I/O +3.3 L - UOP 150 cc yest - Wt 62kg (admit 51kg, peak 62kg)  Vitals:   10/09/18 0810 10/09/18 0900 10/09/18 1000 10/09/18 1100  BP:  (!) 92/48 98/65 (!) 106/52  Pulse: (!) 55 86    Resp: (!) 34 (!) 34 (!) 27 (!) 34  Temp: 98.2 F (36.8 C) 98.1 F (36.7 C) 97.7 F (36.5 C) 97.7 F (36.5 C)  TempSrc:      SpO2: 99% 99%    Weight:      Height:        Inpatient medications: . sodium chloride   Intravenous Once  . sodium chloride   Intravenous Once  . atovaquone  1,500 mg Oral Daily  . azithromycin  1,200 mg Oral Q Tue  . benzonatate  100 mg Oral TID  . chlorhexidine  15 mL Mouth Rinse BID  . chlorhexidine gluconate (MEDLINE KIT)  15 mL Mouth Rinse BID  . Chlorhexidine Gluconate Cloth  6 each Topical Daily  . dextromethorphan-guaiFENesin  1 tablet Oral BID  . emtricitabine-tenofovir AF  1 tablet Per Tube Daily   And  . dolutegravir  50 mg Oral Daily  . insulin aspart  2-6 Units Subcutaneous Q4H  . mouth rinse  15 mL Mouth Rinse 10 times per day  . methylPREDNISolone (SOLU-MEDROL) injection  80 mg Intravenous Q12H  . metoCLOPramide (REGLAN) injection  5 mg Intravenous Once  . mometasone-formoterol  2 puff Inhalation BID  . pantoprazole sodium  40 mg Per Tube Daily  . sodium chloride flush  10-40 mL Intracatheter Q12H   .  prismasol BGK 4/2.5 500 mL/hr at 10/08/18 2109  .  prismasol BGK 4/2.5 300 mL/hr at 10/09/18 0705  . sodium chloride 10 mL/hr at 10/09/18 0858  . anticoagulant sodium citrate    . ceFEPime (MAXIPIME) IV Stopped (10/09/18 0537)  . dexmedetomidine (PRECEDEX) IV infusion Stopped (10/08/18 0945)  . fentaNYL infusion INTRAVENOUS 50 mcg/hr (10/09/18 1100)  . norepinephrine (LEVOPHED) Adult infusion 12 mcg/min (10/09/18 1100)  . prismasol BGK 4/2.5 1,800 mL/hr at 10/09/18 1044  . vasopressin (PITRESSIN) infusion -  *FOR SHOCK* 0.03 Units/min (10/09/18 1100)   sodium chloride, acetaminophen (TYLENOL) oral liquid 160 mg/5 mL, albuterol, alteplase, anticoagulant sodium citrate, fentaNYL (SUBLIMAZE) injection, heparin, midazolam, morphine injection, sodium chloride, sodium chloride flush    Exam: Gen intubated and sedated L IJ temp hd cath No rash, cyanosis or gangrene Sclera anicteric, throat w ett  No jvd or bruits Chest coarse rales/ rhonchi bilat  RRR no MRG, tachy Abd soft ntnd no mass or ascites +bs GU foley draining dark brown urine minimal amts MS no joint effusions or deformity Ext 1-2+ bilat LE/UE edema, no wounds or ulcers Neuro is sedated as above    Home meds:  - atovaquone 1.5 gm qd/ azithromycin 1.2 gm weekly  - bictegravir-emtricitabine-tenofovir qd  - doxycyxline 100 bid/ cefdinir 300 bid  - prednisone taper 40 qd > 10 qd     UA pending  UNa 33, UCreat 94   Today >>  - remains on pressors x 2 - FiO2 100% - I/O +3.3 L - UOP 150 cc yest - Wt 62kg (admit 51kg, peak 62kg)    Assessment: 1. AKI - due to shock/ hypoperfusion w/ ATN. CRRT started 10/08/18.  2. Volume - diffuse edema, shock on pressors. Keeping even w/   crrt.  3. Shock /  Hypotension - on pressors 4. Hyperkalemia - resolved 5. Metabolic acidosis - resolved 6. Recurrent PNA/ resp failure - hx CAP , PJP  7. AIDS /HIV 8. Severe thrombocytopenia   Plan - cont CRRT, keeping even, no heparin    [facility] 10/09/2018, 11:10 AM  Iron/TIBC/Ferritin/ %Sat    Component Value Date/Time   IRON 15 (L) 09/05/2018 0640   TIBC 146 (L) 09/05/2018 0640   FERRITIN 9,379 (H) 10/02/2018 1112   IRONPCTSAT 10 (L) 09/05/2018 0640   Recent Labs  Lab 10/07/18 1800  10/09/18 0417  NA  --    < > 139  K  --    < > 4.8  CL  --    < > 105  CO2  --    < > 23  GLUCOSE  --    < > 138*  BUN  --    < > 47*  CREATININE  --    < > 1.35*  CALCIUM  --    < > 6.7*  PHOS  --    < > 5.7*   ALBUMIN  --    < > 1.8*  INR 1.9*  --   --    < > = values in this interval not displayed.   Recent Labs  Lab 10/08/18 1053  AST 129*  ALT 24  ALKPHOS 291*  BILITOT 4.4*  PROT 5.1*   Recent Labs  Lab 10/09/18 0417  WBC 13.0*  HGB 8.0*  HCT 23.3*  PLT 7*      

## 2018-10-09 NOTE — Progress Notes (Signed)
CCM unable to successfully obtain arterial line. RN aware.

## 2018-10-09 NOTE — Progress Notes (Signed)
Turned off Fentanyl gtt at 80mcg/hr per CCM MD Nelda Marseille request, pt minimally responsive to painful stimuli, only response is facial grimacing with eyes fluttering, slight flexion noted, pt will bite down on ETT tube and cough over vent without sedation.   Currently on 100% FiO2, resp rate 34, Peep 10.   ABG ordered.

## 2018-10-09 NOTE — Progress Notes (Signed)
eLink Physician-Brief Progress Note Patient Name: Gene Niska DOB: 1985/10/27 MRN: [id number]   Date of Service  10/09/2018  HPI/Events of Note  Notified of platelets at 7.  No signs of bleeding.  K 4.8, calcium 6.7, corrected for albumin is 8.5.  eICU Interventions  Transfuse 1 unit platelets.  No need for K and Ca repletion.     Intervention Category Intermediate Interventions: Thrombocytopenia - evaluation and management  Elsie Lincoln 10/09/2018, 6:29 AM

## 2018-10-10 ENCOUNTER — Telehealth: Payer: Self-pay | Admitting: *Deleted

## 2018-10-10 ENCOUNTER — Inpatient Hospital Stay (HOSPITAL_COMMUNITY): Payer: 59

## 2018-10-10 DIAGNOSIS — A419 Sepsis, unspecified organism: Secondary | ICD-10-CM

## 2018-10-10 DIAGNOSIS — R6521 Severe sepsis with septic shock: Secondary | ICD-10-CM

## 2018-10-10 LAB — GLUCOSE, CAPILLARY
Glucose-Capillary: 111 mg/dL — ABNORMAL HIGH (ref 70–99)
Glucose-Capillary: 125 mg/dL — ABNORMAL HIGH (ref 70–99)
Glucose-Capillary: 149 mg/dL — ABNORMAL HIGH (ref 70–99)
Glucose-Capillary: 192 mg/dL — ABNORMAL HIGH (ref 70–99)
Glucose-Capillary: 196 mg/dL — ABNORMAL HIGH (ref 70–99)
Glucose-Capillary: 205 mg/dL — ABNORMAL HIGH (ref 70–99)

## 2018-10-10 LAB — BLOOD GAS, ARTERIAL
Acid-Base Excess: 1.3 mmol/L (ref 0.0–2.0)
Bicarbonate: 26 mmol/L (ref 20.0–28.0)
Drawn by: 232811
FIO2: 100
MECHVT: 420 mL
O2 Saturation: 99.6 %
PEEP: 10 cmH2O
Patient temperature: 36.5
RATE: 34 resp/min
pCO2 arterial: 43.2 mmHg (ref 32.0–48.0)
pH, Arterial: 7.393 (ref 7.350–7.450)
pO2, Arterial: 292 mmHg — ABNORMAL HIGH (ref 83.0–108.0)

## 2018-10-10 LAB — PREPARE PLATELET PHERESIS: Unit division: 0

## 2018-10-10 LAB — RENAL FUNCTION PANEL
Albumin: 1.8 g/dL — ABNORMAL LOW (ref 3.5–5.0)
Albumin: 1.8 g/dL — ABNORMAL LOW (ref 3.5–5.0)
Anion gap: 8 (ref 5–15)
Anion gap: 8 (ref 5–15)
BUN: 24 mg/dL — ABNORMAL HIGH (ref 6–20)
BUN: 23 mg/dL — ABNORMAL HIGH (ref 6–20)
CO2: 26 mmol/L (ref 22–32)
CO2: 27 mmol/L (ref 22–32)
Calcium: 7.2 mg/dL — ABNORMAL LOW (ref 8.9–10.3)
Calcium: 7.3 mg/dL — ABNORMAL LOW (ref 8.9–10.3)
Chloride: 103 mmol/L (ref 98–111)
Chloride: 104 mmol/L (ref 98–111)
Creatinine, Ser: 0.97 mg/dL (ref 0.44–1.00)
Creatinine, Ser: 0.92 mg/dL (ref 0.44–1.00)
GFR calc Af Amer: >60 mL/min (ref >60)
GFR calc Af Amer: >60 mL/min (ref >60)
GFR calc non Af Amer: >60 mL/min (ref >60)
GFR calc non Af Amer: >60 mL/min (ref >60)
Glucose, Bld: 120 mg/dL — ABNORMAL HIGH (ref 70–99)
Glucose, Bld: 206 mg/dL — ABNORMAL HIGH (ref 70–99)
Phosphorus: 3.5 mg/dL (ref 2.5–4.6)
Phosphorus: 2.8 mg/dL (ref 2.5–4.6)
Potassium: 4.5 mmol/L (ref 3.5–5.1)
Potassium: 4 mmol/L (ref 3.5–5.1)
Sodium: 137 mmol/L (ref 135–145)
Sodium: 139 mmol/L (ref 135–145)

## 2018-10-10 LAB — CBC
HCT: 21 % — ABNORMAL LOW (ref 36.0–46.0)
Hemoglobin: 7 g/dL — ABNORMAL LOW (ref 12.0–15.0)
MCH: 30 pg (ref 26.0–34.0)
MCHC: 33.3 g/dL (ref 30.0–36.0)
MCV: 90.1 fL (ref 80.0–100.0)
Platelets: 5 10*3/uL — CL (ref 150–400)
RBC: 2.33 MIL/uL — ABNORMAL LOW (ref 3.87–5.11)
RDW: 18.4 % — ABNORMAL HIGH (ref 11.5–15.5)
WBC: 14.6 10*3/uL — ABNORMAL HIGH (ref 4.0–10.5)
nRBC: 11.6 % — ABNORMAL HIGH (ref 0.0–0.2)

## 2018-10-10 LAB — PREPARE RBC (CROSSMATCH)

## 2018-10-10 LAB — APTT: aPTT: 40 seconds — ABNORMAL HIGH (ref 24–36)

## 2018-10-10 LAB — PHOSPHORUS: Phosphorus: 3.6 mg/dL (ref 2.5–4.6)

## 2018-10-10 LAB — CULTURE, BAL-QUANTITATIVE W GRAM STAIN: Culture: 50000 — AB

## 2018-10-10 LAB — BPAM PLATELET PHERESIS
Blood Product Expiration Date: 202009042359
ISSUE DATE / TIME: 202009020640
Unit Type and Rh: 7300

## 2018-10-10 LAB — HEMOGLOBIN AND HEMATOCRIT, BLOOD
HCT: 25.3 % — ABNORMAL LOW (ref 36.0–46.0)
Hemoglobin: 8.5 g/dL — ABNORMAL LOW (ref 12.0–15.0)

## 2018-10-10 LAB — MAGNESIUM: Magnesium: 2.6 mg/dL — ABNORMAL HIGH (ref 1.7–2.4)

## 2018-10-10 MED ORDER — VITAL HIGH PROTEIN PO LIQD: 1000.0000 mL | ORAL | Status: DC

## 2018-10-10 MED ORDER — FENTANYL BOLUS VIA INFUSION
25.0000 ug | INTRAVENOUS | Status: DC | PRN
  Administered 2018-10-10 – 2018-10-14 (×21): 25 ug via INTRAVENOUS
  Filled 2018-10-10: qty 25

## 2018-10-10 MED ORDER — VITAL HIGH PROTEIN PO LIQD
1000.0000 mL | ORAL | Status: AC
  Administered 2018-10-10 – 2018-10-15 (×6): 1000 mL

## 2018-10-10 MED ORDER — PRO-STAT SUGAR FREE PO LIQD
30.0000 mL | Freq: Two times a day (BID) | ORAL | Status: DC
  Administered 2018-10-10 – 2018-10-16 (×12): 30 mL
  Filled 2018-10-10 (×12): qty 30

## 2018-10-10 MED ORDER — SODIUM CHLORIDE 0.9% IV SOLUTION
Freq: Once | INTRAVENOUS | Status: AC
  Administered 2018-10-10: 12:00:00 via INTRAVENOUS

## 2018-10-10 NOTE — Progress Notes (Signed)
Patient agitated with bath, biting on ETT tube/coughing over vent, no current continuous sedation. Purposeful movement noted to hands, patient reaching towards face/ETT tube, non-purposeful movement noted to bilateral legs.  Eyes open but pt is not tracking movement, does not follow commands. ST 110s with agitation, will cough and vagal and HR will drop to 50s. 2mg  PRN Versed given for comfort/relief.

## 2018-10-10 NOTE — Telephone Encounter (Signed)
[facility] "Hospital Consult Form" completed.   Critical Care Team notified patient currently established with our practice.  "Request to speak with provider who will be seeing patient today or inpatient hematology clinic provider."      Collaborative nurse notified.  Form submitted/placed for provider review with clinic.

## 2018-10-10 NOTE — Progress Notes (Signed)
[facility] Progress Note  Subjective: on vent, on CRRT no issues overnight w/ filters - remains on pressors x 2 - FiO2 down 50% - I/O even yesterday - UOP none yest - Wt 58kg (admit 51kg, peak 62kg)  Vitals:   10/10/18 1400 10/10/18 1405 10/10/18 1415 10/10/18 1447  BP: 103/61  (!) 98/57 (!) 73/41  Pulse: 90 90 90 (!) 101  Resp: (!) 32 (!) 31 (!) 32 (!) 30  Temp: 98.6 F (37 C) 98.6 F (37 C) 98.6 F (37 C) 99 F (37.2 C)  TempSrc:   Bladder Bladder  SpO2: 91% 92% 91%   Weight:      Height:        Inpatient medications: . sodium chloride   Intravenous Once  . sodium chloride   Intravenous Once  . atovaquone  1,500 mg Oral Daily  . azithromycin  1,200 mg Oral Q Tue  . benzonatate  100 mg Oral TID  . chlorhexidine  15 mL Mouth Rinse BID  . chlorhexidine gluconate (MEDLINE KIT)  15 mL Mouth Rinse BID  . Chlorhexidine Gluconate Cloth  6 each Topical Daily  . emtricitabine-tenofovir AF  1 tablet Per Tube Daily   And  . dolutegravir  50 mg Oral Daily  . feeding supplement (PRO-STAT SUGAR FREE 64)  30 mL Per Tube BID  . guaiFENesin-dextromethorphan  7.5 mL Per Tube Q6H  . insulin aspart  2-6 Units Subcutaneous Q4H  . mouth rinse  15 mL Mouth Rinse 10 times per day  . methylPREDNISolone (SOLU-MEDROL) injection  80 mg Intravenous Q12H  . metoCLOPramide (REGLAN) injection  5 mg Intravenous Once  . mometasone-formoterol  2 puff Inhalation BID  . pantoprazole sodium  40 mg Per Tube Daily  . sodium chloride flush  10-40 mL Intracatheter Q12H   .  prismasol BGK 4/2.5 500 mL/hr at 10/10/18 1343  .  prismasol BGK 4/2.5 300 mL/hr at 10/10/18 0700  . sodium chloride 500 mL (10/10/18 1246)  . sodium chloride    . anticoagulant sodium citrate    . ceFEPime (MAXIPIME) IV Stopped (10/10/18 7412)  . dexmedetomidine (PRECEDEX) IV infusion 0.8 mcg/kg/hr (10/10/18 1500)  . feeding supplement (VITAL HIGH PROTEIN) 50 mL/hr at 10/10/18 1100  . fentaNYL infusion INTRAVENOUS  50 mcg/hr (10/10/18 1500)  . norepinephrine (LEVOPHED) Adult infusion 10 mcg/min (10/10/18 1500)  . prismasol BGK 4/2.5 1,800 mL/hr at 10/10/18 1159  . vasopressin (PITRESSIN) infusion - *FOR SHOCK* 0.03 Units/min (10/10/18 1500)   sodium chloride, Place/Maintain arterial line **AND** sodium chloride, acetaminophen (TYLENOL) oral liquid 160 mg/5 mL, albuterol, alteplase, anticoagulant sodium citrate, fentaNYL (SUBLIMAZE) injection, midazolam, morphine injection, sodium chloride, sodium chloride flush    Exam: Gen intubated and sedated L IJ temp hd cath No rash, cyanosis or gangrene Sclera anicteric, throat w ett  No jvd or bruits Chest coarse rales/ rhonchi bilat  RRR no MRG, tachy Abd soft ntnd no mass or ascites +bs GU foley draining dark brown urine minimal amts MS no joint effusions or deformity Ext 1-2+ bilat LE/UE edema, no wounds or ulcers Neuro is sedated as above    Home meds:  - atovaquone 1.5 gm qd/ azithromycin 1.2 gm weekly  - bictegravir-emtricitabine-tenofovir qd  - doxycyxline 100 bid/ cefdinir 300 bid  - prednisone taper 40 qd > 10 qd     UA 8/25 negative UA  UNa 33, UCreat 94    Renal US 9/2 >  11- 12 cm kidneys w/ ^'d echo and no hydro  bilat    Assessment: 1. AKI - due to shock/ hypoperfusion w/ ATN. CRRT started 10/08/18. Mild -mod edema on exam. Remains in shock on pressors.  2. Shock /  Hypotension - on pressors 3. Hyperkalemia - resolved 4. Metabolic acidosis - resolved 5. Recurrent PNA/ resp failure - hx CAP , PJP  6. AIDS /HIV 7. Severe thrombocytopenia   Plan - cont CRRT, no heparin    Rob [facility] 10/10/2018, 3:06 PM  Iron/TIBC/Ferritin/ %Sat    Component Value Date/Time   IRON 15 (L) 09/05/2018 0640   TIBC 146 (L) 09/05/2018 0640   FERRITIN 9,379 (H) 10/02/2018 1112   IRONPCTSAT 10 (L) 09/05/2018 0640   Recent Labs  Lab 10/07/18 1800  10/10/18 0424  NA  --    < > 137  K  --    < > 4.5  CL  --     < > 103  CO2  --    < > 26  GLUCOSE  --    < > 120*  BUN  --    < > 24*  CREATININE  --    < > 0.97  CALCIUM  --    < > 7.2*  PHOS  --    < > 3.5  3.6  ALBUMIN  --    < > 1.8*  INR 1.9*  --   --    < > = values in this interval not displayed.   Recent Labs  Lab 10/08/18 1053  AST 129*  ALT 24  ALKPHOS 291*  BILITOT 4.4*  PROT 5.1*   Recent Labs  Lab 10/10/18 0424  WBC 14.6*  HGB 7.0*  HCT 21.0*  PLT 5*

## 2018-10-10 NOTE — Progress Notes (Signed)
Brief Hematology Note:  Spoke with PCCM NP.  They have asked if okay from a hematology standpoint to discontinue steroids.  I discussed with Dr. Irene Limbo.  May taper steroids from our perspective.  She has not had a response with her hemoglobin or platelets with steroids.  Please check with infectious disease as to whether they feel strongly that steroids should be continued.  Mikey Bussing, DNP, AGPCNP-BC, AOCNP

## 2018-10-10 NOTE — Progress Notes (Addendum)
NAMEMadailein Short, MRN:  [id number], DOB:  Dec 05, 1985, LOS: 4 ADMISSION DATE:  10/05/2018, CONSULTATION DATE:  8/30 REFERRING MD:  Dr. Craig Short, CHIEF COMPLAINT:  SOB   Brief History   33 y/o F, former smoker (quit 2018) admitted 8/29 with progressive shortness of breath. Decompensated with acute respiratory failure on 8/31 requiring intubation and initiation of 2 pressors. Worsening AKI and metabolic acidosis requiring CRRT on 9/1. As of 9/36 BAL culture positive for Staphylococcus Epidermidis   Recent Evaluation   06/14/2018-07/16/2018 Bordetella bronchus infection. Underwent FOB 07/12/2018 s PCP studiesBiopsy scant lung parenchyma with fibrosis and reactive changes. Required 4 PRBC and 3 platelet infusion.  07/31/2026-08/22/2018 sepsis from suspected PJP pneumonia. Treated with steroids clindamycin and primaquine. Required 8 PRBC and 7 platelet transfusion. RVP positive for routine coronavirus.  09/03/2018-09/13/2018 generalized weakness and deconditioning. Required 3 PRBC 8 platelet transfusion.  7/30/2020IVIGwithout any improvement in platelet count. Given prolonged prednisone taper.  09/11/18 Bone marrow Bx all micro studies neg   10/01/2018-10/04/2018 presented with anemia and poor IV access. Given 2 PRBC 1 platelet transfusion. Required pressors. Treated with ceftriaxone and doxy for ? CAP.   Significant Hospital Events   8/29 Admit  8/30 PCCM consulted,started solumedrol 8/30 am @ 80 mg q 12h 8/31 Intubated, FOB 9/1 CRRT   Consults:  PCCM 8/30 ID 8/30  Hem/Onc 8/30 Nephrology 9/1  Procedures:  R IJ CVC 8/30 >> ETT 8/31 >> FOB 8/31 >> L HD cath 9/1 >>  Significant Diagnostic Tests:  Head CT 8/31 >> negative for any acute intracranial process  Renal ultrasound 9/2 >> No hydronephrosis, medical renal disease   Micro Data:  COVID 19 pcr 8/29>>neg BCx 2 8/29 >> no growth 2 days  PCP8/31>> neg 9/1 FOB BAL 8/31 >> Pending but 50,000 Coag neg staph    Antimicrobials:  Bactrim 8/29 >>8/30 Maxepime 8/29 >> PO Atovaquone 8/30 >> PO Azithromycin 9/1 >>  Interim history/subjective:  RN reports re-occurant episodes of bradycardia when biting her ET tube. 1.4L out with CRRT, no urine output. Remains on full vent support with 2 pressors   Objective   Blood pressure (!) 101/54, pulse 95, temperature 98.1 F (36.7 C), resp. rate (!) 23, height _0  (1.549 m), weight 58.3 kg, SpO2 99 %. CVP:  [0 mmHg-8 mmHg] 7 mmHg  Vent Mode: PRVC FiO2 (%):  [70 %-100 %] 70 % Set Rate:  [34 bmp-37 bmp] 37 bmp Vt Set:  [420 mL] 420 mL PEEP:  [10 cmH20] 10 cmH20 Plateau Pressure:  [34 cmH20-40 cmH20] 38 cmH20   Intake/Output Summary (Last 24 hours) at 10/10/2018 0824 Last data filed at 10/10/2018 0800 Gross per 24 hour  Intake 1224.63 ml  Output 1497 ml  Net -272.37 ml   Filed Weights   10/06/18 0941 10/09/18 0412 10/10/18 0306  Weight: 55.4 kg 62 kg 58.3 kg   Examination: General: Young African American female lying in bed, critically ill appearing  HENT: MM pink and dry with slight blood tinged secretions in tubing  Neuro: On fentanyl drip, will open eyes to verbal stimuli but will not track  Cardiovascular: s1s2 regular rate and rhythm, no murmur or rub  Pulm: Tachypnea, bilaterally lung sounds with coarse crack, no accessory muscle use seen GI: OG tube in place,start TF, soft active bowel sounds on all quadrants  Extremities: Warm/dry, trace 1+ edema in all extremities  Skin: No rashes or lesions   Assessment & Plan:  Acute Recurrent Hypoxemic Respiratory Failure  -In the setting  of bacterial infection and diffuse alveolar hemorrhage   P: Remain on full vent support with PRVC, VT 420 Wean PEEP / FiO2 for sats > 90% Continue IV steroids, titrate per Hem/Onc  Follow CVP VAP protocol in place   Recurrent Bilateral Pulmonary Infiltrates  -BAL positive for Staph Epidermidis with continued hemoptysis  P: Follow BAL for PJP ABX per ID   Await cultures   Profound Persistent Thrombocytopenia  -Extensive workup completed by Hemo/Onc with BM BX suggesting "normal bone marrow". Currently on stress dose steroids, initially started for possible sarcoid but no granulomas on prior biospy P: Heme/ONC following  Taper steroids per Hem/Onc  Transfuse for plt less than 10  Symptomatic Anemia  -with nl BM hematopoesis suggests peripheral consumption. Further decline possible secondary to initiation of CRRT and continued hemoptysis  P: Follow CBC  Transfuse per ICU guidelines   AKI AGMA Hyperkalemia  Hypocalcemia  Likely related to volume depletion vs drug side effects, no evidence of a pulmonary/renal syndrome. Currently on CRRT, 1.4L out with no urine output seen overnight. FENa 0.5%, renal ultrasound neg P: Nephrology consulted for evaluation  HD cathter in place  Continue to trend BMP/ renal output  Follow electrolytes and replace as needed  Ensure adequate renal profusion and avoid nephrotoxins   Circulatory Shock -some component of sedation, +/- sepsis  P: Follow CVP  Levophed and vasopressin for MAP > 65   HIV  HIV viral load undetectable but CD4 count remains low   P: Per ID   Best practice:  Diet: Initiating TF via OG tube Pain/Anxiety/Delirium protocol (if indicated): Fentanyl drip  VAP protocol (if indicated): In place  DVT prophylaxis: SCD's, allergy to heparin  GI prophylaxis: PPI Glucose control: N/A Mobility: BR Code Status: Full Family Communication: Both husband and mom updated extensively at bedside and continue to express desire for aggressive measures    Disposition: ICU  Labs   CBC: Recent Labs  Lab 10/04/18 0514  10/06/18 1153 10/06/18 2000 10/07/18 0350 10/08/18 0335 10/09/18 0417 10/10/18 0424  WBC 20.4*   < > 21.2*  --  20.1* 24.7* 13.0* 14.6*  NEUTROABS 12.5*  --   --   --   --   --   --   --   HGB 8.5*   < > 6.8* 12.0 12.3 9.8* 8.0* 7.0*  HCT 26.0*   < > 21.5* 36.0  36.6 29.6* 23.3* 21.0*  MCV 92.5   < > 94.3  --  89.9 91.4 87.9 90.1  PLT 13*   < > 17*  --  7* 12* 7* 5*   < > = values in this interval not displayed.    Basic Metabolic Panel: Recent Labs  Lab 10/04/18 0514  10/08/18 0335  10/08/18 1053 10/08/18 1758 10/08/18 2102 10/09/18 0102 10/09/18 0417 10/09/18 1722 10/10/18 0424  NA 134*   < > 144   < > 146* 142  --   --  139 139 137  K 4.1   < > 7.0*   < > 6.1* 4.9 4.7 4.8 4.8 4.6 4.5  CL 110   < > 116*   < > 115* 107  --   --  105 104 103  CO2 16*   < > 14*   < > 18* 22  --   --  _0 GLUCOSE 111*   < > 157*   < > 116* 116*  --   --  138* 128* 120*  BUN 43*   < >  92*   < > 95* 82*  --   --  47* 33* 24*  CREATININE 1.05*   < > 2.42*   < > 2.73* 2.14*  --   --  1.35* 1.10* 0.97  CALCIUM 7.5*   < > 6.3*   < > 6.0* 5.9*  --   --  6.7* 6.9* 7.2*  MG 2.3  --  2.8*  --   --   --   --   --  2.4  --  2.6*  PHOS  --   --  11.8*  --   --  9.4*  --   --  5.7* 4.3 3.5  3.6   < > = values in this interval not displayed.   GFR: Estimated Creatinine Clearance: 69 mL/min (by C-G formula based on SCr of 0.97 mg/dL). Recent Labs  Lab 10/05/18 2313 10/06/18 0113  10/07/18 0350 10/08/18 0105 10/08/18 0335 10/09/18 0417 10/10/18 0424  WBC  --   --    < > 20.1*  --  24.7* 13.0* 14.6*  LATICACIDVEN 2.6* 1.2  --   --  1.2  --   --   --    < > = values in this interval not displayed.    Liver Function Tests: Recent Labs  Lab 10/04/18 0514 10/05/18 0120 10/07/18 1209 10/08/18 0956 10/08/18 1053 10/08/18 1758 10/09/18 0417 10/09/18 1722 10/10/18 0424  AST 18 28 119* 110* 129*  --   --   --   --   ALT _0 --   --   --   --   ALKPHOS 125 191* 187* 254* 291*  --   --   --   --   BILITOT 1.6* 3.1* 3.5* 3.7* 4.4*  --   --   --   --   PROT 6.2* 6.0* 6.0* 4.4* 5.1*  --   --   --   --   ALBUMIN 1.7* 1.6* 2.0* 1.4* 1.5* 1.8* 1.8* 1.7* 1.8*   No results for input(s): LIPASE, AMYLASE in the last 168 hours. No results for  input(s): AMMONIA in the last 168 hours.  ABG    Component Value Date/Time   PHART 7.393 10/10/2018 0420   PCO2ART 43.2 10/10/2018 0420   PO2ART 292 (H) 10/10/2018 0420   HCO3 26.0 10/10/2018 0420   TCO2 15 (L) 10/06/2018 0300   ACIDBASEDEF 10.9 (H) 10/08/2018 1139   O2SAT 99.6 10/10/2018 0420     Coagulation Profile: Recent Labs  Lab 10/05/18 0120 10/07/18 1800  INR 1.4* 1.9*    Cardiac Enzymes: No results for input(s): CKTOTAL, CKMB, CKMBINDEX, TROPONINI in the last 168 hours.  HbA1C: Hgb A1c MFr Bld  Date/Time Value Ref Range Status  08/09/2018 02:28 AM 5.5 4.8 - 5.6 % Final    Comment:    (NOTE) Pre diabetes:          5.7%-6.4% Diabetes:              >6.4% Glycemic control for   <7.0% adults with diabetes   08/02/2018 06:46 PM 4.7 (L) 4.8 - 5.6 % Final    Comment:    (NOTE) Pre diabetes:          5.7%-6.4% Diabetes:              >6.4% Glycemic control for   <7.0% adults with diabetes     CBG: Recent Labs  Lab 10/09/18 1704 10/09/18 2014 10/09/18 2353 10/10/18 0355 10/10/18  [place]    Critical care time:     CRITICAL CARE Performed by: Johnsie Cancel   Total critical care time: 40 minutes  Critical care time was exclusive of separately billable procedures and treating other patients.  Critical care was necessary to treat or prevent imminent or life-threatening deterioration.  Critical care was time spent personally by me on the following activities: development of treatment plan with patient and/or surrogate as well as nursing, discussions with consultants, evaluation of patient's response to treatment, examination of patient, obtaining history from patient or surrogate, ordering and performing treatments and interventions, ordering and review of laboratory studies, ordering and review of radiographic studies, pulse oximetry and re-evaluation of patient's condition.  Johnsie Cancel, NP-C [facility] Pgr: [phone] or if no answer [phone] 10/10/18   Attending Note:  33 year old female with HIV who presents to PCCM with respiratory failure, renal failure and AMS.  Patient remains profoundly thrombocytopenic but FiO2 demand is improving.  On exam, coarse BS diffusely.  I reviewed CXR myself, ETT is in a good position and infiltrate noted.  Discussed with PCCM-NP.  Family not bedside.  Decrease PEEP to 8 and FiO2 to 50%.  Continue CRRT fluid even at this point.  Levophed for BP support.  Continue support for now.  The patient is critically ill with multiple organ systems failure and requires high complexity decision making for assessment and support, frequent evaluation and titration of therapies, application of advanced monitoring technologies and extensive interpretation of multiple databases.   Critical Care Time devoted to patient care services described in this note is  32  Minutes. This time reflects time of care of this signee Dr Jennet Maduro. This critical care time does not reflect procedure time, or teaching time or supervisory time of PA/NP/Med student/Med Resident etc but could involve care discussion time.  Rush Farmer, M.D. [facility] Pulmonary/Critical Care Medicine. Pager: [phone]. After hours pager: [phone].

## 2018-10-10 NOTE — Progress Notes (Signed)
Nutrition Follow-up  DOCUMENTATION CODES:   Not applicable  INTERVENTION:  - will order TF: Vital High Protein @ 50 ml/hr with 30 ml prostat BID.  - this regimen will provide 1400 kcal (95% estimated kcal need), 135 grams protein, and 1003 ml free water.   NUTRITION DIAGNOSIS:   Inadequate oral intake related to inability to eat as evidenced by NPO status. -ongoing  GOAL:   Patient will meet greater than or equal to 90% of their needs -unmet at this time  MONITOR:   Vent status, TF tolerance, Labs, Weight trends, I & O's  REASON FOR ASSESSMENT:   Consult Enteral/tube feeding initiation and management  ASSESSMENT:   33 y.o. female with medical history significant for advanced HIV, normocytic anemia, thrombocytopenia, and recent admission for sepsis d/t suspected pneumocystis pneumonia. She presented to the ED from the [facility] on 8/25 for evaluation of symptomatic anemia, hypotension, and SOB. She reported on and off symptoms of lightheadedness, generalized weakness, SOB, nonproductive cough over the last 3-4 days. She reported a single episode of fever with diaphoresis. She denied any chest pain, N/V, abdominal pain, diarrhea, or dysuria. She went to the [facility] for routine Nplate injection and transfusion of RBCs. She was therefore sent to the ED for possible need of central line and blood transfusion.  Significant Events: 8/29- admission 8/31- intubation and OGT placement 9/1- HD cath placement; CRRT initiation   Used admission weight (51.3 kg) as weight +7 kg since that time. Patient remains intubated with OGT in place. Spoke with RN who reports that patient is still not making urine/having urine output. RN aware of plan for TF start today.   Per notes: - acute recurrent hypoxemic respiratory failure with recurrent pulmonary infiltrates - persistent thrombocytopenia--Hematology following; ID recommending lymph and lung biopsy when possible - symptomatic  anemia - AKI--now on CRRT - circulatory shock - HIV   Patient is currently intubated on ventilator support MV: 13.9 L/min Temp (24hrs), Avg:97.5 F (36.4 C), Min:96.4 F (35.8 C), Max:98.1 F (36.7 C) Propofol: none BP: 103/55 and MAP: 69  Labs reviewed; CBGs: 111 and 125 mg/dl, BUN: 24 mg/dl, Ca: 7.2 mg/dl, Mg: 2.6 mg/dl; K and Phos now WDL. Medications reviewed; sliding scale novolog, 80 mg solu-medrol BID. Drips; fentanyl @ 25 mcg/hr, levo @ 8 mcg/min, vaso @ 0.03 units/min.     NUTRITION - FOCUSED PHYSICAL EXAM:  completed; no muscle and no fat wasting, moderate edema to all extremities.   Diet Order:   Diet Order            Diet NPO time specified  Diet effective now              EDUCATION NEEDS:   No education needs have been identified at this time  Skin:  Skin Assessment: Reviewed RN Assessment  Last BM:  9/2  Height:   Ht Readings from Last 1 Encounters:  10/06/18 5\' 1"  (1.549 m)    Weight:   Wt Readings from Last 1 Encounters:  10/10/18 58.3 kg    Ideal Body Weight:  47.7 kg  BMI:  Body mass index is 24.29 kg/m.  Estimated Nutritional Needs:   Kcal:  1468 kcal  Protein:  >/= 128 grams (2.5 grams/kg)  Fluid:  >/= 1.8 L/day     Jarome Matin, MS, RD, LDN, [facility] Inpatient Clinical Dietitian Pager # [phone] After hours/weekend pager # [phone]

## 2018-10-10 NOTE — Progress Notes (Signed)
Patient ID: Krystal Short, female   DOB: [birth year], 33 y.o.   MRN: [id number]          [facility] for Infectious Disease  Date of Admission:  10/05/2018           Day 5 cefepime ASSESSMENT: The cause of her progressive multisystem illness remains uncertain.  I do not think that the coagulase-negative staph growing from BAL fluid is likely to be pathogenic.  Likewise, her CMV viral load is up at 144,000 but her overall clinical and radiographic picture does not suggest invasive CMV disease.  CMV viremia is common following an acute infection.  I would not start her on CMV therapy.  I do not think that this is all due to an HIV related opportunistic infection.  I do not think that her HIV medications (Biktarvy, atovaquone and azithromycin) are causing her cytopenias.  PLAN: 1. Continue cefepime  2. Continue Biktarvy, atovaquone and azithromycin  Active Problems:   Pulmonary infiltrates   Lymphadenopathy, generalized   Acute respiratory failure with hypoxemia (HCC)   Symptomatic anemia   Thrombocytopenia (HCC)   HIV disease (HCC)   Molluscum contagiosum   ASCUS with positive high risk HPV cervical   AKI (acute kidney injury) ([place])   Elevated bilirubin   Anemia   Elevated liver enzymes   AIDS (acquired immune deficiency syndrome) (HCC)   ARDS (adult respiratory distress syndrome) (HCC)   Scheduled Meds: . sodium chloride   Intravenous Once  . sodium chloride   Intravenous Once  . sodium chloride   Intravenous Once  . atovaquone  1,500 mg Oral Daily  . azithromycin  1,200 mg Oral Q Tue  . benzonatate  100 mg Oral TID  . chlorhexidine  15 mL Mouth Rinse BID  . chlorhexidine gluconate (MEDLINE KIT)  15 mL Mouth Rinse BID  . Chlorhexidine Gluconate Cloth  6 each Topical Daily  . emtricitabine-tenofovir AF  1 tablet Per Tube Daily   And  . dolutegravir  50 mg Oral Daily  . feeding supplement (PRO-STAT SUGAR FREE 64)  30 mL Per Tube BID  . guaiFENesin-dextromethorphan  7.5  mL Per Tube Q6H  . insulin aspart  2-6 Units Subcutaneous Q4H  . mouth rinse  15 mL Mouth Rinse 10 times per day  . methylPREDNISolone (SOLU-MEDROL) injection  80 mg Intravenous Q12H  . metoCLOPramide (REGLAN) injection  5 mg Intravenous Once  . mometasone-formoterol  2 puff Inhalation BID  . pantoprazole sodium  40 mg Per Tube Daily  . sodium chloride flush  10-40 mL Intracatheter Q12H   Continuous Infusions: .  prismasol BGK 4/2.5 500 mL/hr at 10/10/18 0327  .  prismasol BGK 4/2.5 300 mL/hr at 10/10/18 0700  . sodium chloride 250 mL (10/09/18 2018)  . sodium chloride    . anticoagulant sodium citrate    . ceFEPime (MAXIPIME) IV Stopped (10/10/18 1779)  . dexmedetomidine (PRECEDEX) IV infusion 0.4 mcg/kg/hr (10/10/18 1028)  . feeding supplement (VITAL HIGH PROTEIN) 50 mL/hr at 10/10/18 1100  . fentaNYL infusion INTRAVENOUS 25 mcg/hr (10/10/18 1100)  . norepinephrine (LEVOPHED) Adult infusion 8 mcg/min (10/10/18 1100)  . prismasol BGK 4/2.5 1,800 mL/hr at 10/10/18 0900  . vasopressin (PITRESSIN) infusion - *FOR SHOCK* Stopped (10/10/18 0940)   PRN Meds:.sodium chloride, Place/Maintain arterial line **AND** sodium chloride, acetaminophen (TYLENOL) oral liquid 160 mg/5 mL, albuterol, alteplase, anticoagulant sodium citrate, fentaNYL (SUBLIMAZE) injection, midazolam, morphine injection, sodium chloride, sodium chloride flush  Review of Systems: Review of Systems  Unable to  perform ROS: Intubated    Allergies  Allergen Reactions  . Heparin Other (See Comments)    Unknown reaction    OBJECTIVE: Vitals:   10/10/18 1000 10/10/18 1030 10/10/18 1051 10/10/18 1100  BP: (!) 144/95 (!) 107/57  113/70  Pulse: 90 (!) 107  (!) 101  Resp: (!) 22 (!) 24  (!) 27  Temp: 97.9 F (36.6 C) 97.9 F (36.6 C)  98.1 F (36.7 C)  TempSrc:      SpO2: 91% 90% 90% 90%  Weight:      Height:       Body mass index is 24.29 kg/m.  Physical Exam Constitutional:      Comments: She remains on the  ventilator.  Does not follow commands.  Cardiovascular:     Rate and Rhythm: Normal rate and regular rhythm.     Heart sounds: No murmur. No friction rub.  Pulmonary:     Breath sounds: Rhonchi present. No wheezing or rales.  Abdominal:     General: There is distension.     Comments: Absent bowel sounds.  Skin:    Comments: Papules consistent with molluscum contagiosum on face.     Lab Results Lab Results  Component Value Date   WBC 14.6 (H) 10/10/2018   HGB 7.0 (L) 10/10/2018   HCT 21.0 (L) 10/10/2018   MCV 90.1 10/10/2018   PLT 5 (LL) 10/10/2018    Lab Results  Component Value Date   CREATININE 0.97 10/10/2018   BUN 24 (H) 10/10/2018   NA 137 10/10/2018   K 4.5 10/10/2018   CL 103 10/10/2018   CO2 26 10/10/2018    Lab Results  Component Value Date   ALT 24 10/08/2018   AST 129 (H) 10/08/2018   ALKPHOS 291 (H) 10/08/2018   BILITOT 4.4 (H) 10/08/2018     Microbiology: Recent Results (from the past 240 hour(s))  Blood Culture (routine x 2)     Status: None   Collection Time: 10/01/18  6:32 PM   Specimen: BLOOD RIGHT ARM  Result Value Ref Range Status   Specimen Description   Final    BLOOD RIGHT ARM Performed at [facility], [place] [address], [place], [place] [address]    Special Requests   Final    BOTTLES DRAWN AEROBIC AND ANAEROBIC Blood Culture adequate volume Performed at [place] [address], [place], [place] [address]    Culture   Final    NO GROWTH 5 DAYS Performed at [facility], [place] [address], [place], [place] [address]    Report Status 10/06/2018 FINAL  Final  Urine culture     Status: Abnormal   Collection Time: 10/01/18  6:32 PM   Specimen: In/Out Cath Urine  Result Value Ref Range Status   Specimen Description   Final    IN/OUT CATH URINE Performed at [place] [address], [place], [place] [address]    Special Requests   Final    NONE Performed at [facility], [place] [address] [place],  [address]    Culture (A)  Final    <10,000 COLONIES/mL INSIGNIFICANT GROWTH Performed at [place] [address], [place],  [address]    Report Status 10/02/2018 FINAL  Final  Novel Coronavirus, NAA (Hosp order, Send-out to Ref Lab; TAT 18-24 hrs     Status: None   Collection Time: 10/01/18  6:55 PM  Result Value Ref Range  Status   SARS-CoV-2, NAA NOT DETECTED NOT DETECTED Final    Comment: (NOTE) This test was developed and its performance characteristics determined by [employer]. This test has not been FDA cleared or approved. This test has been authorized by FDA under an Emergency Use Authorization (EUA). This test is only authorized for the duration of time the declaration that circumstances exist justifying the authorization of the emergency use of in vitro diagnostic tests for detection of SARS-CoV-2 virus and/or diagnosis of COVID-19 infection under section 564(b)(1) of the Act, 21 U.S.C. 885OYD-7(A)(1), unless the authorization is terminated or revoked sooner. When diagnostic testing is negative, the possibility of a false negative result should be considered in the context of a patient's recent exposures and the presence of clinical signs and symptoms consistent with COVID-19. An individual without symptoms of COVID-19 and who is not shedding SARS-CoV-2 virus would expect to have a negative (not detected) result in this assay. Performed  At: [facility] [place], [place] [id number] Rush Farmer MD CN:[id number]    [place]  Final    Comment: Performed at [facility] [address], [place], [place] [address]  MRSA PCR Screening     Status: None   Collection Time: 10/01/18 10:44 PM   Specimen: Nasal Mucosa; Nasopharyngeal  Result Value Ref Range Status   MRSA by PCR NEGATIVE NEGATIVE Final    Comment:        The  GeneXpert MRSA Assay (FDA approved for NASAL specimens only), is one component of a comprehensive MRSA colonization surveillance program. It is not intended to diagnose MRSA infection nor to guide or monitor treatment for MRSA infections. Performed at [facility], [place] [address], [place], [place] [address]   Blood Culture (routine x 2)     Status: None   Collection Time: 10/02/18 11:00 AM   Specimen: BLOOD RIGHT HAND  Result Value Ref Range Status   Specimen Description   Final    BLOOD RIGHT HAND Performed at [place] [address], [place], [place] [address]    Special Requests   Final    BOTTLES DRAWN AEROBIC AND ANAEROBIC Blood Culture adequate volume Performed at [place] [address] [place], [place] [address]    Culture   Final    NO GROWTH 5 DAYS Performed at [facility], [place] [address] [place], [place] [address]    Report Status 10/07/2018 FINAL  Final  Culture, blood (Routine x 2)     Status: None (Preliminary result)   Collection Time: 10/05/18  1:20 AM   Specimen: BLOOD  Result Value Ref Range Status   Specimen Description   Final    BLOOD PICC LINE Performed at [place] [address] [place], [place] [address]    Special Requests   Final    BOTTLES DRAWN AEROBIC AND ANAEROBIC Blood Culture adequate volume Performed at [place] [address], [place], [place] [address]    Culture   Final    NO GROWTH 3 DAYS Performed at [facility], [place] [address], [place], [place] [address]    Report Status PENDING  Incomplete  Culture, blood (Routine x 2)     Status: None (Preliminary result)   Collection Time: 10/05/18 11:13 PM   Specimen: BLOOD  Result Value Ref Range Status   Specimen Description   Final    BLOOD LEFT ARM Performed  at [facility], [place], [place] [address] [place], [place] [address]    Special Requests    Final    BOTTLES DRAWN AEROBIC AND ANAEROBIC Blood Culture adequate volume Performed at [place] [address] [place], [place] [address]    Culture   Final    NO GROWTH 3 DAYS Performed at [facility], [place] [address], [place], [place] [address]    Report Status PENDING  Incomplete  SARS Coronavirus 2 [facility] order, Performed in [facility] hospital lab) Nasopharyngeal Nasopharyngeal Swab     Status: None   Collection Time: 10/05/18 11:22 PM   Specimen: Nasopharyngeal Swab  Result Value Ref Range Status   SARS Coronavirus 2 NEGATIVE NEGATIVE Final    Comment: (NOTE) If result is NEGATIVE SARS-CoV-2 target nucleic acids are NOT DETECTED. The SARS-CoV-2 RNA is generally detectable in upper and lower  respiratory specimens during the acute phase of infection. The lowest  concentration of SARS-CoV-2 viral copies this assay can detect is 250  copies / mL. A negative result does not preclude SARS-CoV-2 infection  and should not be used as the sole basis for treatment or other  patient management decisions.  A negative result may occur with  improper specimen collection / handling, submission of specimen other  than nasopharyngeal swab, presence of viral mutation(s) within the  areas targeted by this assay, and inadequate number of viral copies  (<250 copies / mL). A negative result must be combined with clinical  observations, patient history, and epidemiological information. If result is POSITIVE SARS-CoV-2 target nucleic acids are DETECTED. The SARS-CoV-2 RNA is generally detectable in upper and lower  respiratory specimens dur ing the acute phase of infection.  Positive  results are indicative of active infection with SARS-CoV-2.  Clinical  correlation with patient history and other diagnostic information is  necessary to determine patient infection status.  Positive results do  not rule out bacterial infection or co-infection with other viruses.  If result is PRESUMPTIVE POSTIVE SARS-CoV-2 nucleic acids MAY BE PRESENT.   A presumptive positive result was obtained on the submitted specimen  and confirmed on repeat testing.  While 2019 novel coronavirus  (SARS-CoV-2) nucleic acids may be present in the submitted sample  additional confirmatory testing may be necessary for epidemiological  and / or clinical management purposes  to differentiate between  SARS-CoV-2 and other Sarbecovirus currently known to infect humans.  If clinically indicated additional testing with an alternate test  methodology [phone]) is advised. The SARS-CoV-2 RNA is generally  detectable in upper and lower respiratory sp ecimens during the acute  phase of infection. The expected result is Negative. Fact Sheet for Patients:  StrictlyIdeas.no Fact Sheet for Healthcare Providers: BankingDealers.co.za This test is not yet approved or cleared by the [place] FDA and has been authorized for detection and/or diagnosis of SARS-CoV-2 by FDA under an Emergency Use Authorization (EUA).  This EUA will remain in effect (meaning this test can be used) for the duration of the COVID-19 declaration under Section 564(b)(1) of the Act, 21 U.S.C. section 360bbb-3(b)(1), unless the authorization is terminated or revoked sooner. Performed at [facility], [place] [address] [place], [place] [address]   Culture, bal-quantitative     Status: Abnormal   Collection Time: 10/07/18 10:16 AM   Specimen: Bronchoalveolar Lavage; Respiratory  Result Value Ref Range Status   Specimen Description   Final    BRONCHIAL ALVEOLAR LAVAGE Performed at [facility],  [place] [address], [place], [place] [address]    Special Requests   Final    Immunocompromised Performed at [facility], [place] [address] [place], [place] [address]    Gram Stain   Final    FEW SQUAMOUS EPITHELIAL CELLS  PRESENT MODERATE WBC PRESENT, PREDOMINANTLY MONONUCLEAR FEW GRAM POSITIVE COCCI Performed at [facility], [place] [address] [place], [place] [address]    Culture 50,000 COLONIES/mL STAPHYLOCOCCUS EPIDERMIDIS (A)  Final   Report Status 10/10/2018 FINAL  Final   Organism ID, Bacteria STAPHYLOCOCCUS EPIDERMIDIS (A)  Final      Susceptibility   Staphylococcus epidermidis - MIC*    CIPROFLOXACIN 4 RESISTANT Resistant     ERYTHROMYCIN >=8 RESISTANT Resistant     GENTAMICIN 4 SENSITIVE Sensitive     OXACILLIN >=4 RESISTANT Resistant     TETRACYCLINE >=16 RESISTANT Resistant     VANCOMYCIN 1 SENSITIVE Sensitive     TRIMETH/SULFA 80 RESISTANT Resistant     CLINDAMYCIN >=8 RESISTANT Resistant     RIFAMPIN <=0.5 SENSITIVE Sensitive     Inducible Clindamycin NEGATIVE Sensitive     * 50,000 COLONIES/mL STAPHYLOCOCCUS EPIDERMIDIS  Pneumocystis smear by DFA     Status: None   Collection Time: 10/07/18 10:16 AM   Specimen: Bronchial Alveolar Lavage; Respiratory  Result Value Ref Range Status   Specimen Source-PJSRC BRONCHIAL ALVEOLAR LAVAGE  Final   Pneumocystis jiroveci Ag NEGATIVE  Final    Comment: Performed at [place] Performed at [place] [address] [place],  [address]     Michel Bickers, [facility] [phone] pager   [phone] cell 10/10/2018, 11:21 AM

## 2018-10-11 DIAGNOSIS — D761 Hemophagocytic lymphohistiocytosis: Secondary | ICD-10-CM

## 2018-10-11 LAB — HEPATIC FUNCTION PANEL
ALT: 21 U/L (ref 0–44)
AST: 56 U/L — ABNORMAL HIGH (ref 15–41)
Albumin: 1.9 g/dL — ABNORMAL LOW (ref 3.5–5.0)
Alkaline Phosphatase: 374 U/L — ABNORMAL HIGH (ref 38–126)
Bilirubin, Direct: 1.9 mg/dL — ABNORMAL HIGH (ref 0.0–0.2)
Indirect Bilirubin: 1.9 mg/dL — ABNORMAL HIGH (ref 0.3–0.9)
Total Bilirubin: 3.8 mg/dL — ABNORMAL HIGH (ref 0.3–1.2)
Total Protein: 5.7 g/dL — ABNORMAL LOW (ref 6.5–8.1)

## 2018-10-11 LAB — PREPARE PLATELET PHERESIS: Unit division: 0

## 2018-10-11 LAB — CBC WITH DIFFERENTIAL/PLATELET
Abs Immature Granulocytes: 0.8 10*3/uL — ABNORMAL HIGH (ref 0.00–0.07)
Band Neutrophils: 6 %
Basophils Absolute: 0 10*3/uL (ref 0.0–0.1)
Basophils Relative: 0 %
Eosinophils Absolute: 0 10*3/uL (ref 0.0–0.5)
Eosinophils Relative: 0 %
HCT: 23.9 % — ABNORMAL LOW (ref 36.0–46.0)
Hemoglobin: 7.9 g/dL — ABNORMAL LOW (ref 12.0–15.0)
Lymphocytes Relative: 5 %
Lymphs Abs: 0.8 10*3/uL (ref 0.7–4.0)
MCH: 29.9 pg (ref 26.0–34.0)
MCHC: 33.1 g/dL (ref 30.0–36.0)
MCV: 90.5 fL (ref 80.0–100.0)
Monocytes Absolute: 0.9 10*3/uL (ref 0.1–1.0)
Monocytes Relative: 6 %
Myelocytes: 5 %
Neutro Abs: 12.6 10*3/uL — ABNORMAL HIGH (ref 1.7–7.7)
Neutrophils Relative %: 78 %
Platelets: 7 10*3/uL — CL (ref 150–400)
RBC: 2.64 MIL/uL — ABNORMAL LOW (ref 3.87–5.11)
RDW: 17.3 % — ABNORMAL HIGH (ref 11.5–15.5)
WBC: 15 10*3/uL — ABNORMAL HIGH (ref 4.0–10.5)
nRBC: 13.3 % — ABNORMAL HIGH (ref 0.0–0.2)

## 2018-10-11 LAB — RENAL FUNCTION PANEL
Albumin: 1.7 g/dL — ABNORMAL LOW (ref 3.5–5.0)
Albumin: 1.9 g/dL — ABNORMAL LOW (ref 3.5–5.0)
Anion gap: 9 (ref 5–15)
Anion gap: 10 (ref 5–15)
BUN: 27 mg/dL — ABNORMAL HIGH (ref 6–20)
BUN: 27 mg/dL — ABNORMAL HIGH (ref 6–20)
CO2: 25 mmol/L (ref 22–32)
CO2: 24 mmol/L (ref 22–32)
Calcium: 7.1 mg/dL — ABNORMAL LOW (ref 8.9–10.3)
Calcium: 7.3 mg/dL — ABNORMAL LOW (ref 8.9–10.3)
Chloride: 105 mmol/L (ref 98–111)
Chloride: 104 mmol/L (ref 98–111)
Creatinine, Ser: 0.94 mg/dL (ref 0.44–1.00)
Creatinine, Ser: 0.91 mg/dL (ref 0.44–1.00)
GFR calc Af Amer: >60 mL/min (ref >60)
GFR calc Af Amer: >60 mL/min (ref >60)
GFR calc non Af Amer: >60 mL/min (ref >60)
GFR calc non Af Amer: >60 mL/min (ref >60)
Glucose, Bld: 207 mg/dL — ABNORMAL HIGH (ref 70–99)
Glucose, Bld: 209 mg/dL — ABNORMAL HIGH (ref 70–99)
Phosphorus: 1.7 mg/dL — ABNORMAL LOW (ref 2.5–4.6)
Phosphorus: 1.3 mg/dL — ABNORMAL LOW (ref 2.5–4.6)
Potassium: 3.5 mmol/L (ref 3.5–5.1)
Potassium: 3.2 mmol/L — ABNORMAL LOW (ref 3.5–5.1)
Sodium: 139 mmol/L (ref 135–145)
Sodium: 138 mmol/L (ref 135–145)

## 2018-10-11 LAB — TRIGLYCERIDES: Triglycerides: 347 mg/dL — ABNORMAL HIGH (ref <150)

## 2018-10-11 LAB — FUNGUS CULTURE WITH STAIN

## 2018-10-11 LAB — GLUCOSE, CAPILLARY
Glucose-Capillary: 128 mg/dL — ABNORMAL HIGH (ref 70–99)
Glucose-Capillary: 168 mg/dL — ABNORMAL HIGH (ref 70–99)
Glucose-Capillary: 187 mg/dL — ABNORMAL HIGH (ref 70–99)
Glucose-Capillary: 192 mg/dL — ABNORMAL HIGH (ref 70–99)
Glucose-Capillary: 211 mg/dL — ABNORMAL HIGH (ref 70–99)
Glucose-Capillary: 218 mg/dL — ABNORMAL HIGH (ref 70–99)

## 2018-10-11 LAB — DIC (DISSEMINATED INTRAVASCULAR COAGULATION)PANEL
D-Dimer, Quant: 13.78 ug/mL-FEU — ABNORMAL HIGH (ref 0.00–0.50)
Fibrinogen: 332 mg/dL (ref 210–475)
INR: 1.3 — ABNORMAL HIGH (ref 0.8–1.2)
Platelets: 6 10*3/uL — CL (ref 150–400)
Prothrombin Time: 15.9 seconds — ABNORMAL HIGH (ref 11.4–15.2)
Smear Review: NONE SEEN
aPTT: 31 seconds (ref 24–36)

## 2018-10-11 LAB — CULTURE, BLOOD (ROUTINE X 2)
Culture: NO GROWTH
Culture: NO GROWTH
Special Requests: ADEQUATE
Special Requests: ADEQUATE

## 2018-10-11 LAB — FUNGUS CULTURE RESULT

## 2018-10-11 LAB — MAGNESIUM: Magnesium: 2.4 mg/dL (ref 1.7–2.4)

## 2018-10-11 LAB — BPAM PLATELET PHERESIS
Blood Product Expiration Date: 202009052359
ISSUE DATE / TIME: 202009031439
Unit Type and Rh: 2800

## 2018-10-11 LAB — FUNGAL ORGANISM REFLEX

## 2018-10-11 LAB — LACTATE DEHYDROGENASE: LDH: 541 U/L — ABNORMAL HIGH (ref 98–192)

## 2018-10-11 LAB — FERRITIN: Ferritin: >7500 ng/mL — ABNORMAL HIGH (ref 11–307)

## 2018-10-11 LAB — APTT: aPTT: 32 seconds (ref 24–36)

## 2018-10-11 MED ORDER — SODIUM CHLORIDE 0.9% IV SOLUTION: Freq: Once | INTRAVENOUS | Status: AC

## 2018-10-11 MED ORDER — DEXAMETHASONE SODIUM PHOSPHATE 10 MG/ML IJ SOLN
20.0000 mg | Freq: Every day | INTRAMUSCULAR | Status: AC
  Administered 2018-10-11 – 2018-10-14 (×4): 20 mg via INTRAVENOUS
  Filled 2018-10-11 (×7): qty 2

## 2018-10-11 NOTE — Progress Notes (Signed)
NAME:  Krystal Short, MRN:  [id number], DOB:  [birth year], LOS: 5 ADMISSION DATE:  10/05/2018, CONSULTATION DATE:  10/06/2018 REFERRING MD:  Dr. Craig Guess, CHIEF COMPLAINT:  SOB   Brief History   Krystal Short is a 33 yo F with history of HIV/AIDS admitted on 8/29 with progressive shortness of breath. History of multiple admissions over the last year for waxing / waning pulmonary infiltrates. Unfortunately patient decompensated on 8/31 requiring initiation of mechanical intubation and later pressure support and CRRT.    Recent Evaluation  06/14/2018-07/16/2018 Bordetella bronchus infection. Underwent FOB 07/12/2018 s PCP studiesBiopsy scant lung parenchyma with fibrosis and reactive changes. Required 4 PRBC and 3 platelet infusion.  07/31/2026-08/22/2018 sepsis from suspected PJP pneumonia. Treated with steroids clindamycin and primaquine. Required 8 PRBC and 7 platelet transfusion. RVP positive for routine coronavirus.  09/03/2018-09/13/2018 generalized weakness and deconditioning. Required 3 PRBC 8 platelet transfusion.  7/30/2020IVIGwithout any improvement in platelet count. Given prolonged prednisone taper.  09/11/18 Bone marrowBx all micro studies neg   10/01/2018-10/04/2018 presented with anemia and poor IV access. Given 2 PRBC 1 platelet transfusion. Required pressors. Treated with ceftriaxone and doxy for ? CAP.  Significant Hospital Events   8/29 Admit  8/30 PCCM consulted,started solumedrol 8/30 am @ 80 mg q 12h 8/31 Intubated, FOB 9/1 CRRT   Consults:  PCCM 8/30 ID 8/30 Hem/Onc 8/30 Nephrology 9/1  Procedures:  R IJ CVC 8/30 >> ETT 8/31 >> FOB 8/31 >> L HD cath 9/1 >>  Significant Diagnostic Tests:  Head CT 8/31 >> negative for any acute intracranial process Renal ultrasound 9/2 >> No hydronephrosis, medical renal disease   Micro Data:  COVID 19 pcr 8/29>>neg BCx 2 8/29 >>no growth 2 days PCP8/31>>neg 9/1 FOB BAL 8/31 >> Pending but 50,000 Coag  neg staph  Antimicrobials:  Bactrim 8/29 >>8/30 Maxepime 8/29 >> PO Atovaquone 8/30 >> PO Azithromycin 9/1 >>  Interim history/subjective:  RN reports multiple lose stools overnight after starting TF, rectal tube inserted. Afebrile, 2.8L out with CRRT, 310 urine output. Remains on full vent support with two pressors   Objective   Blood pressure (!) 82/48, pulse 88, temperature (!) 97.5 F (36.4 C), temperature source Bladder, resp. rate (!) 30, height _0  (1.549 m), weight 57.7 kg, SpO2 99 %. CVP:  [6 mmHg-9 mmHg] 9 mmHg  Vent Mode: PRVC FiO2 (%):  [50 %] 50 % Set Rate:  [30 bmp-34 bmp] 30 bmp Vt Set:  [420 mL] 420 mL PEEP:  [8 cmH20-10 cmH20] 10 cmH20 Plateau Pressure:  [22 cmH20-40 cmH20] 28 cmH20   Intake/Output Summary (Last 24 hours) at 10/11/2018 0092 Last data filed at 10/11/2018 0900 Gross per 24 hour  Intake 3029.07 ml  Output 3501 ml  Net -471.93 ml   Filed Weights   10/09/18 0412 10/10/18 0306 10/11/18 0452  Weight: 62 kg 58.3 kg 57.7 kg    Examination: General:Sedated young African American female lying in bed, critically ill appearing HENT:Bite block and ETT in place, mucus membranes pink and dry, bloody secretions in suction tubing Neuro:Right visual gaze, no purposeful movement on exam today, on fentanyl drip, will no track  Cardiovascular:s1s2 regular rate and rhythm, no murmur or rub  Pulm:Bilaterally lung sounds with coarse crack, no accessory muscle use seen GI:OG tube in place, continueTF, soft active bowel sounds on all quadrants, rectal tube in place   Extremities: Warm/dry, trace 1+ edema in all extremities  Skin: No rashes or lesions  Resolved Hospital Problem list  Assessment & Plan:  AcuteRecurrentHypoxemic Respiratory Failure -In the setting of advanced HIV/AIDS now with Staph Epid seen on BAL and diffuse alveloar hemorrhage  P: Continue full vent support with PRVC, VT 420 VAP protocol in place Follow CVP Continue steroids,  per Hemo/Onc and ID  RecurrentBilateralPulmonaryInfiltrates -Staph Epid seen on BAL, PJP neg. BC with no growth at 4 days  P: ABX per ID   Acute encephalopathy  -In the setting of advanced HIV/AIDS, respiratory and renal failure, and profound thrombocytopenia. Head CT neg for any acute abnormalities  P: Obtain MRI brian today Utilize lowest amount of sedation possible    ProfoundPersistentThrombocytopenia -Extensive workup completed by Hemo/Onc with BM BX suggesting "normal bone marrow". Currently on stress dose steroids, initially started for possible sarcoid but no granulomas on prior biospy P: Steroids taper per Hem/Onc and ID Transfuse plt less than 10 Monitor for excessive bleeding   SymptomaticAnemia -with nl BM hematopoesis suggests peripheral consumption. Further decline possible secondary to initiation of CRRT and continued hemoptysis  P: ICU transfusion guidelines  Follow CBC daily   AKI AGMA Hyperkalemia  Hypocalcemia  Likelyrelated to volume depletion vs drug side effects, no evidence of a pulmonary/renal syndrome. Currently on CRRT 2.8L out with 310 urine output seen overnight. FENa 0.5%, renal ultrasound neg P: Nephrology following for CRRT  Avoid nephrotoxins, ensure adequate renal perfusion  Follow BMP and renal output daily Follow and replace electrolytes as needed    CirculatoryShock -likely some component of sedation, +/- sepsis  P: Continue pressor support and wean as able  Monitor CVP  HIV  HIV viral load undetectable but CD4 count remains low   P: Per ID  Best practice:  Diet:TF via OG tube Pain/Anxiety/Delirium protocol (if indicated):Fentanyl dripand Precedex VAP protocol (if indicated):In place DVT prophylaxis:SCD's, allergy to heparin GI prophylaxis:PPI Glucose control:N/A Mobility:BR Code Status:Full Family Communication:Both husband and mom updated daily at bedside and continue to express desire for  aggressive measures    Disposition:ICU  Labs   CBC: Recent Labs  Lab 10/07/18 0350 10/08/18 0335 10/09/18 0417 10/10/18 0424 10/10/18 1701 10/11/18 0455  WBC 20.1* 24.7* 13.0* 14.6*  --  15.0*  NEUTROABS  --   --   --   --   --  12.6*  HGB 12.3 9.8* 8.0* 7.0* 8.5* 7.9*  HCT 36.6 29.6* 23.3* 21.0* 25.3* 23.9*  MCV 89.9 91.4 87.9 90.1  --  90.5  PLT 7* 12* 7* 5*  --  7*    Basic Metabolic Panel: Recent Labs  Lab 10/08/18 0335  10/09/18 0417 10/09/18 1722 10/10/18 0424 10/10/18 1701 10/11/18 0455  NA 144   < > 139 139 137 139 139  K 7.0*   < > 4.8 4.6 4.5 4.0 3.5  CL 116*   < > 105 104 103 104 105  CO2 14*   < > _0 GLUCOSE 157*   < > 138* 128* 120* 206* 207*  BUN 92*   < > 47* 33* 24* 23* 27*  CREATININE 2.42*   < > 1.35* 1.10* 0.97 0.92 0.94  CALCIUM 6.3*   < > 6.7* 6.9* 7.2* 7.3* 7.1*  MG 2.8*  --  2.4  --  2.6*  --  2.4  PHOS 11.8*   < > 5.7* 4.3 3.5  3.6 2.8 1.7*   < > = values in this interval not displayed.   GFR: Estimated Creatinine Clearance: 70.9 mL/min (by C-G formula based on SCr of 0.94 mg/dL).  Recent Labs  Lab 10/05/18 2313 10/06/18 0113  10/08/18 0105 10/08/18 0335 10/09/18 0417 10/10/18 0424 10/11/18 0455  WBC  --   --    < >  --  24.7* 13.0* 14.6* 15.0*  LATICACIDVEN 2.6* 1.2  --  1.2  --   --   --   --    < > = values in this interval not displayed.    Liver Function Tests: Recent Labs  Lab 10/05/18 0120 10/07/18 1209 10/08/18 0956 10/08/18 1053  10/09/18 0417 10/09/18 1722 10/10/18 0424 10/10/18 1701 10/11/18 0455  AST 28 119* 110* 129*  --   --   --   --   --   --   ALT _0 --   --   --   --   --   --   ALKPHOS 191* 187* 254* 291*  --   --   --   --   --   --   BILITOT 3.1* 3.5* 3.7* 4.4*  --   --   --   --   --   --   PROT 6.0* 6.0* 4.4* 5.1*  --   --   --   --   --   --   ALBUMIN 1.6* 2.0* 1.4* 1.5*   < > 1.8* 1.7* 1.8* 1.8* 1.7*   < > = values in this interval not displayed.   No results for  input(s): LIPASE, AMYLASE in the last 168 hours. No results for input(s): AMMONIA in the last 168 hours.  ABG    Component Value Date/Time   PHART 7.393 10/10/2018 0420   PCO2ART 43.2 10/10/2018 0420   PO2ART 292 (H) 10/10/2018 0420   HCO3 26.0 10/10/2018 0420   TCO2 15 (L) 10/06/2018 0300   ACIDBASEDEF 10.9 (H) 10/08/2018 1139   O2SAT 99.6 10/10/2018 0420     Coagulation Profile: Recent Labs  Lab 10/05/18 0120 10/07/18 1800  INR 1.4* 1.9*    Cardiac Enzymes: No results for input(s): CKTOTAL, CKMB, CKMBINDEX, TROPONINI in the last 168 hours.  HbA1C: Hgb A1c MFr Bld  Date/Time Value Ref Range Status  08/09/2018 02:28 AM 5.5 4.8 - 5.6 % Final    Comment:    (NOTE) Pre diabetes:          5.7%-6.4% Diabetes:              >6.4% Glycemic control for   <7.0% adults with diabetes   08/02/2018 06:46 PM 4.7 (L) 4.8 - 5.6 % Final    Comment:    (NOTE) Pre diabetes:          5.7%-6.4% Diabetes:              >6.4% Glycemic control for   <7.0% adults with diabetes     CBG: Recent Labs  Lab 10/10/18 1648 10/10/18 1937 10/10/18 2347 10/11/18 0350 10/11/18 0739  GLUCAP 196* 205* 192* 211* 168*     Critical care time:    CRITICAL CARE Performed by: Johnsie Cancel   Total critical care time: 45 minutes  Critical care time was exclusive of separately billable procedures and treating other patients.  Critical care was necessary to treat or prevent imminent or life-threatening deterioration.  Critical care was time spent personally by me on the following activities: development of treatment plan with patient and/or surrogate as well as nursing, discussions with consultants, evaluation of patient's response to treatment, examination of patient, obtaining history from patient or surrogate,  ordering and performing treatments and interventions, ordering and review of laboratory studies, ordering and review of radiographic studies, pulse oximetry and re-evaluation of  patient's condition.   Johnsie Cancel, NP-C [facility] Pgr: [phone] or if no answer [phone] 10/11/18

## 2018-10-11 NOTE — Progress Notes (Addendum)
Patient ID: Krystal Short, female   DOB: 11/28/1985, 33 y.o.   MRN: [id number]          [facility]  Date of Admission:  10/05/2018           Day 6 cefepime ASSESSMENT: The cause of her progressive multisystem illness remains uncertain.  It is okay with me to begin tapering her steroids.  PLAN: 1. Continue cefepime for 1 more day for possible healthcare associated pneumonia (stop order placed) 2. Continue Biktarvy, atovaquone and azithromycin 3. Please call Dr. Carlyle Basques [phone]) for any infectious disease questions this weekend  Active Problems:   Pulmonary infiltrates   Lymphadenopathy, generalized   Acute respiratory failure with hypoxemia (HCC)   Symptomatic anemia   Thrombocytopenia (HCC)   HIV disease (HCC)   Molluscum contagiosum   ASCUS with positive high risk HPV cervical   AKI (acute kidney injury) ([place])   Elevated bilirubin   Anemia   Elevated liver enzymes   AIDS (acquired immune deficiency syndrome) (HCC)   ARDS (adult respiratory distress syndrome) (HCC)   Scheduled Meds: . atovaquone  1,500 mg Oral Daily  . azithromycin  1,200 mg Oral Q Tue  . benzonatate  100 mg Oral TID  . chlorhexidine  15 mL Mouth Rinse BID  . chlorhexidine gluconate (MEDLINE KIT)  15 mL Mouth Rinse BID  . Chlorhexidine Gluconate Cloth  6 each Topical Daily  . emtricitabine-tenofovir AF  1 tablet Per Tube Daily   And  . dolutegravir  50 mg Oral Daily  . feeding supplement (PRO-STAT SUGAR FREE 64)  30 mL Per Tube BID  . guaiFENesin-dextromethorphan  7.5 mL Per Tube Q6H  . insulin aspart  2-6 Units Subcutaneous Q4H  . mouth rinse  15 mL Mouth Rinse 10 times per day  . methylPREDNISolone (SOLU-MEDROL) injection  80 mg Intravenous Q12H  . metoCLOPramide (REGLAN) injection  5 mg Intravenous Once  . mometasone-formoterol  2 puff Inhalation BID  . pantoprazole sodium  40 mg Per Tube Daily  . sodium chloride flush  10-40 mL Intracatheter Q12H    Continuous Infusions: .  prismasol BGK 4/2.5 400 mL/hr at 10/11/18 0216  .  prismasol BGK 4/2.5 200 mL/hr at 10/11/18 0216  . sodium chloride 500 mL (10/10/18 1246)  . sodium chloride    . anticoagulant sodium citrate    . ceFEPime (MAXIPIME) IV Stopped (10/11/18 6568)  . dexmedetomidine (PRECEDEX) IV infusion 0.8 mcg/kg/hr (10/11/18 0729)  . feeding supplement (VITAL HIGH PROTEIN) 1,000 mL (10/11/18 0856)  . fentaNYL infusion INTRAVENOUS 25 mcg/hr (10/11/18 1000)  . norepinephrine (LEVOPHED) Adult infusion 2 mcg/min (10/11/18 1000)  . prismasol BGK 4/2.5 1,400 mL/hr at 10/11/18 0800  . vasopressin (PITRESSIN) infusion - *FOR SHOCK* Stopped (10/10/18 1546)   PRN Meds:.sodium chloride, Place/Maintain arterial line **AND** sodium chloride, acetaminophen (TYLENOL) oral liquid 160 mg/5 mL, albuterol, alteplase, anticoagulant sodium citrate, fentaNYL, midazolam, morphine injection, sodium chloride, sodium chloride flush  Review of Systems: Review of Systems  Unable to perform ROS: Intubated    Allergies  Allergen Reactions  . Heparin Other (See Comments)    Unknown reaction    OBJECTIVE: Vitals:   10/11/18 0815 10/11/18 0826 10/11/18 0830 10/11/18 0900  BP: 98/68  123/88 (!) 82/48  Pulse: 69  85 88  Resp: (!) 29  (!) 31 (!) 30  Temp: (!) 97.3 F (36.3 C)  (!) 97.3 F (36.3 C) (!) 97.5 F (36.4 C)  TempSrc:    Bladder  SpO2: 100% 99% 98% 99%  Weight:      Height:       Body mass index is 24.04 kg/m.  Physical Exam Constitutional:      Comments: She remains on the ventilator.   Cardiovascular:     Rate and Rhythm: Normal rate and regular rhythm.     Heart sounds: No murmur. No friction rub.  Pulmonary:     Breath sounds: No wheezing, rhonchi or rales.  Abdominal:     General: There is distension.     Palpations: Abdomen is soft.     Comments: Absent bowel sounds.     Lab Results Lab Results  Component Value Date   WBC 15.0 (H) 10/11/2018   HGB 7.9 (L)  10/11/2018   HCT 23.9 (L) 10/11/2018   MCV 90.5 10/11/2018   PLT 7 (LL) 10/11/2018    Lab Results  Component Value Date   CREATININE 0.94 10/11/2018   BUN 27 (H) 10/11/2018   NA 139 10/11/2018   K 3.5 10/11/2018   CL 105 10/11/2018   CO2 25 10/11/2018    Lab Results  Component Value Date   ALT 24 10/08/2018   AST 129 (H) 10/08/2018   ALKPHOS 291 (H) 10/08/2018   BILITOT 4.4 (H) 10/08/2018     Microbiology: Recent Results (from the past 240 hour(s))  Blood Culture (routine x 2)     Status: None   Collection Time: 10/01/18  6:32 PM   Specimen: BLOOD RIGHT ARM  Result Value Ref Range Status   Specimen Description   Final    BLOOD RIGHT ARM Performed at [facility], [place] [address], [place], [place] [address]    Special Requests   Final    BOTTLES DRAWN AEROBIC AND ANAEROBIC Blood Culture adequate volume Performed at [place] [address], [place], [place] [address]    Culture   Final    NO GROWTH 5 DAYS Performed at [facility], [place] [address] [place], [place] [address]    Report Status 10/06/2018 FINAL  Final  Urine culture     Status: Abnormal   Collection Time: 10/01/18  6:32 PM   Specimen: In/Out Cath Urine  Result Value Ref Range Status   Specimen Description   Final    IN/OUT CATH URINE Performed at [place] [address] [place], [place] [address]    Special Requests   Final    NONE Performed at [facility], [place] [address] [place], [place] [address]    Culture (A)  Final    <10,000 COLONIES/mL INSIGNIFICANT GROWTH Performed at [place] [address] [place], [place] [address]    Report Status 10/02/2018 FINAL  Final  Novel Coronavirus, NAA (Hosp order, Send-out to Ref Lab; TAT 18-24 hrs     Status: None   Collection Time: 10/01/18  6:55 PM  Result Value Ref Range Status   SARS-CoV-2, NAA NOT DETECTED NOT DETECTED Final    Comment: (NOTE)  This test was developed and its performance characteristics determined by [employer]. This test has not been FDA cleared or approved. This test has been authorized by FDA under an Emergency Use Authorization (EUA). This test is only authorized for the duration of time the declaration that circumstances exist justifying the authorization of the emergency use of in vitro diagnostic tests for detection of SARS-CoV-2 virus and/or diagnosis of COVID-19 infection under section 564(b)(1) of the Act, 21  U.S.C. 360bbb-3(b)(1), unless the authorization is terminated or revoked sooner. When diagnostic testing is negative, the possibility of a false negative result should be considered in the context of a patient's recent exposures and the presence of clinical signs and symptoms consistent with COVID-19. An individual without symptoms of COVID-19 and who is not shedding SARS-CoV-2 virus would expect to have a negative (not detected) result in this assay. Performed  At: [facility] [place], [place] [id number] Rush Farmer MD BJ:[id number]    [place]  Final    Comment: Performed at [place] [address], [place], [place] [address]  MRSA PCR Screening     Status: None   Collection Time: 10/01/18 10:44 PM   Specimen: Nasal Mucosa; Nasopharyngeal  Result Value Ref Range Status   MRSA by PCR NEGATIVE NEGATIVE Final    Comment:        The GeneXpert MRSA Assay (FDA approved for NASAL specimens only), is one component of a comprehensive MRSA colonization surveillance program. It is not intended to diagnose MRSA infection nor to guide or monitor treatment for MRSA infections. Performed at [facility], [place] [address] [place], [place] [address]   Blood Culture (routine x 2)     Status: None   Collection Time: 10/02/18 11:00 AM   Specimen: BLOOD RIGHT HAND  Result Value Ref Range Status    Specimen Description   Final    BLOOD RIGHT HAND Performed at [place] [address], [place], [place] [address]    Special Requests   Final    BOTTLES DRAWN AEROBIC AND ANAEROBIC Blood Culture adequate volume Performed at [place] [address], [place], [place] [address]    Culture   Final    NO GROWTH 5 DAYS Performed at [facility], [place] [address] [place], [place] [address]    Report Status 10/07/2018 FINAL  Final  Culture, blood (Routine x 2)     Status: None (Preliminary result)   Collection Time: 10/05/18  1:20 AM   Specimen: BLOOD  Result Value Ref Range Status   Specimen Description   Final    BLOOD PICC LINE Performed at [place] [address], [place], [place] [address]    Special Requests   Final    BOTTLES DRAWN AEROBIC AND ANAEROBIC Blood Culture adequate volume Performed at [place] [address], [place], [place] [address]    Culture   Final    NO GROWTH 4 DAYS Performed at [facility], [place] [address], [place], [place] [address]    Report Status PENDING  Incomplete  Culture, blood (Routine x 2)     Status: None (Preliminary result)   Collection Time: 10/05/18 11:13 PM   Specimen: BLOOD  Result Value Ref Range Status   Specimen Description   Final    BLOOD LEFT ARM Performed at [place] [address] [place], [place] [address]    Special Requests   Final    BOTTLES DRAWN AEROBIC AND ANAEROBIC Blood Culture adequate volume Performed at [place] [address] [place], [place] [address]    Culture   Final    NO GROWTH 4 DAYS Performed at [facility], [place] [address] [place],  [address]    Report Status PENDING  Incomplete  SARS Coronavirus 2 [facility] order, Performed in [facility] hospital lab) Nasopharyngeal  Nasopharyngeal Swab     Status: None   Collection Time: 10/05/18  11:22 PM   Specimen: Nasopharyngeal Swab  Result Value Ref Range Status   SARS Coronavirus 2 NEGATIVE NEGATIVE Final    Comment: (NOTE) If result is NEGATIVE SARS-CoV-2 target nucleic acids are NOT DETECTED. The SARS-CoV-2 RNA is generally detectable in upper and lower  respiratory specimens during the acute phase of infection. The lowest  concentration of SARS-CoV-2 viral copies this assay can detect is 250  copies / mL. A negative result does not preclude SARS-CoV-2 infection  and should not be used as the sole basis for treatment or other  patient management decisions.  A negative result may occur with  improper specimen collection / handling, submission of specimen other  than nasopharyngeal swab, presence of viral mutation(s) within the  areas targeted by this assay, and inadequate number of viral copies  (<250 copies / mL). A negative result must be combined with clinical  observations, patient history, and epidemiological information. If result is POSITIVE SARS-CoV-2 target nucleic acids are DETECTED. The SARS-CoV-2 RNA is generally detectable in upper and lower  respiratory specimens dur ing the acute phase of infection.  Positive  results are indicative of active infection with SARS-CoV-2.  Clinical  correlation with patient history and other diagnostic information is  necessary to determine patient infection status.  Positive results do  not rule out bacterial infection or co-infection with other viruses. If result is PRESUMPTIVE POSTIVE SARS-CoV-2 nucleic acids MAY BE PRESENT.   A presumptive positive result was obtained on the submitted specimen  and confirmed on repeat testing.  While 2019 novel coronavirus  (SARS-CoV-2) nucleic acids may be present in the submitted sample  additional confirmatory testing may be necessary for epidemiological  and / or clinical management purposes  to differentiate between  SARS-CoV-2 and other Sarbecovirus currently known to infect  humans.  If clinically indicated additional testing with an alternate test  methodology [id number]) is advised. The SARS-CoV-2 RNA is generally  detectable in upper and lower respiratory sp ecimens during the acute  phase of infection. The expected result is Negative. Fact Sheet for Patients:  StrictlyIdeas.no Fact Sheet for Healthcare Providers: BankingDealers.co.za This test is not yet approved or cleared by the [place] FDA and has been authorized for detection and/or diagnosis of SARS-CoV-2 by FDA under an Emergency Use Authorization (EUA).  This EUA will remain in effect (meaning this test can be used) for the duration of the COVID-19 declaration under Section 564(b)(1) of the Act, 21 U.S.C. section 360bbb-3(b)(1), unless the authorization is terminated or revoked sooner. Performed at [facility], [place] [address] [place], [place] [address]   Culture, bal-quantitative     Status: Abnormal   Collection Time: 10/07/18 10:16 AM   Specimen: Bronchoalveolar Lavage; Respiratory  Result Value Ref Range Status   Specimen Description   Final    BRONCHIAL ALVEOLAR LAVAGE Performed at [place] [address], [place], [place] [address]    Special Requests   Final    Immunocompromised Performed at [facility], [place] [address], [place], [place] [address]    Gram Stain   Final    FEW SQUAMOUS EPITHELIAL CELLS PRESENT MODERATE WBC PRESENT, PREDOMINANTLY MONONUCLEAR FEW GRAM POSITIVE COCCI Performed at [facility], [place] [address], [place], [place] [address]    Culture 50,000 COLONIES/mL STAPHYLOCOCCUS EPIDERMIDIS (A)  Final   Report Status 10/10/2018 FINAL  Final   Organism ID,  Bacteria STAPHYLOCOCCUS EPIDERMIDIS (A)  Final      Susceptibility   Staphylococcus epidermidis - MIC*    CIPROFLOXACIN 4 RESISTANT Resistant     ERYTHROMYCIN >=8 RESISTANT Resistant      GENTAMICIN 4 SENSITIVE Sensitive     OXACILLIN >=4 RESISTANT Resistant     TETRACYCLINE >=16 RESISTANT Resistant     VANCOMYCIN 1 SENSITIVE Sensitive     TRIMETH/SULFA 80 RESISTANT Resistant     CLINDAMYCIN >=8 RESISTANT Resistant     RIFAMPIN <=0.5 SENSITIVE Sensitive     Inducible Clindamycin NEGATIVE Sensitive     * 50,000 COLONIES/mL STAPHYLOCOCCUS EPIDERMIDIS  Pneumocystis smear by DFA     Status: None   Collection Time: 10/07/18 10:16 AM   Specimen: Bronchial Alveolar Lavage; Respiratory  Result Value Ref Range Status   Specimen Source-PJSRC BRONCHIAL ALVEOLAR LAVAGE  Final   Pneumocystis jiroveci Ag NEGATIVE  Final    Comment: Performed at [place] Performed at [place] [address] [place], [place] [address]     Michel Bickers, [place] [facility] [phone] pager   [phone] cell 10/11/2018, 10:05 AM

## 2018-10-11 NOTE — Progress Notes (Signed)
HEMATOLOGY-ONCOLOGY PROGRESS NOTE  SUBJECTIVE:   Krystal Short was seen in the medical ICU for hematology follow-up today.  She continues to remain intubated and on ventilator.  Having renal replacement therapy at bedside.  Still with significant anemia and thrombocytopenia needing transfusion support. She is critically ill and has very significant complex medical issues. Infectious diseases following and the patient continues to be on cefepime for possible healthcare acquired pneumonia. She continues to have elevated ferritin levels and inflammatory markers -no overt fevers.  REVIEW OF SYSTEMS:   Unable to obtain review of systems secondary to patient condition.  I have reviewed the past medical history, past surgical history, social history and family history with the patient and they are unchanged from previous note.   PHYSICAL EXAMINATION:  Vitals:   10/11/18 1530 10/11/18 1538  BP: 103/66   Pulse: 73   Resp: (!) 30   Temp: 98.6 F (37 C)   SpO2: 97% 98%   Filed Weights   10/09/18 0412 10/10/18 0306 10/11/18 0452  Weight: 136 lb 11 oz (62 kg) 128 lb 8.5 oz (58.3 kg) 127 lb 3.3 oz (57.7 kg)  .Temp (48hrs), Avg:97.7 F (36.5 C), Min:96.4 F (35.8 C), Max:99.5 F (37.5 C)    Intake/Output from previous day: 09/03 0701 - 09/04 0700 In: 2910.8 [I.V.:850.3; Blood:562.5; NG/GT:1298; IV Piggyback:200] Out: 3150 [Urine:5; Stool:310]  GENERAL: Intubated, sedated LUNGS: Coarse breath sounds in the anterior lung fields HEART: regular rate & rhythm and no murmurs and no lower extremity edema ABDOMEN:abdomen soft, non-tender and normal bowel sounds Musculoskeletal:no cyanosis of digits and no clubbing  NEURO: Sedated  LABORATORY DATA:  I have reviewed the data as listed CMP Latest Ref Rng & Units 10/11/2018 10/10/2018 10/10/2018  Glucose 70 - 99 mg/dL 207(H) 206(H) 120(H)  BUN 6 - 20 mg/dL 27(H) 23(H) 24(H)  Creatinine 0.44 - 1.00 mg/dL 0.94 0.92 0.97  Sodium 135 - 145 mmol/L 139  139 137  Potassium 3.5 - 5.1 mmol/L 3.5 4.0 4.5  Chloride 98 - 111 mmol/L 105 104 103  CO2 22 - 32 mmol/L _0 Calcium 8.9 - 10.3 mg/dL 7.1(L) 7.3(L) 7.2(L)  Total Protein 6.5 - 8.1 g/dL 5.7(L) - -  Total Bilirubin 0.3 - 1.2 mg/dL 3.8(H) - -  Alkaline Phos 38 - 126 U/L 374(H) - -  AST 15 - 41 U/L 56(H) - -  ALT 0 - 44 U/L 21 - -   . CBC Latest Ref Rng & Units 10/11/2018 10/11/2018 10/10/2018  WBC 4.0 - 10.5 K/uL - 15.0(H) -  Hemoglobin 12.0 - 15.0 g/dL - 7.9(L) 8.5(L)  Hematocrit 36.0 - 46.0 % - 23.9(L) 25.3(L)  Platelets 150 - 400 K/uL 6(LL) 7(LL) -         Component     Latest Ref Rng & Units 10/07/2018  EBV DNA QN by PCR     Negative copies/mL 1,033  log10 EBV DNA Qn PCR     log10 copy/mL 3.014  CMV Quant DNA PCR (Plasma)     Negative IU/mL 144,000  Log10 CMV Qn DNA Pl     log10 IU/mL 5.158  Interleukin-6, Plasma     0.0 - 12.2 pg/mL 771.6 (H)   Dg Chest 2 View  Result Date: 10/05/2018 CLINICAL DATA:  Suspected sepsis EXAM: CHEST - 2 VIEW COMPARISON:  10/02/2018, 10/01/2018, 09/19/2018, 05/28/2018, 06/03/2017, 03/11/2013, 10/15/2017, 07/12/2018 FINDINGS: Small right-sided pleural effusion. Underlying reticular interstitial changes suggesting component of chronic disease, however there is interim worsening of bilateral  right greater than left airspace disease, suspicious for acute edema or pneumonia superimposed on chronic underlying disease. Stable cardiomediastinal silhouette. No pneumothorax. IMPRESSION: 1. Interval worsening of bilateral right greater than left airspace disease, suspicious for acute edema or pneumonia superimposed on underlying chronic disease. Small right-sided pleural effusion. Electronically Signed   By: Donavan Foil M.D.   On: 10/05/2018 23:52   Dg Chest 2 View  Result Date: 09/19/2018 CLINICAL DATA:  Hypotensive, recent pneumonia EXAM: CHEST - 2 VIEW COMPARISON:  Chest radiograph 08/26/2018 FINDINGS: Heart size within normal limits. Ill-defined  and subtly nodular pulmonary opacities are again demonstrated, more prominent within the right lung. Findings are similar to prior chest radiograph 09/03/2018 and suspicious for infection. No evidence of pneumothorax or pleural effusion. No acute bony abnormality. IMPRESSION: Ill-defined and subtly nodular pulmonary opacities are similar to prior exam 08/26/2018 and suspicious for infection, suspected atypical pneumonia. Electronically Signed   By: Kellie Simmering   On: 09/19/2018 13:33   Dg Abd 1 View  Result Date: 10/07/2018 CLINICAL DATA:  Abdominal distention. OG tube placement. EXAM: ABDOMEN - 1 VIEW COMPARISON:  CT scan of the abdomen dated 08/02/2018 FINDINGS: There is gaseous distention of the stomach. The tip of the OG tube lies in the distal stomach. No dilated large or small bowel. Hepatosplenomegaly. Infiltrates at the lung bases. Bones are normal. IMPRESSION: OG tube tip lies in the distal stomach. Gaseous distention of the stomach. Electronically Signed   By: Lorriane Shire M.D.   On: 10/07/2018 09:58   Ct Head Wo Contrast  Result Date: 10/07/2018 CLINICAL DATA:  Altered mental status, intubation, HIV EXAM: CT HEAD WITHOUT CONTRAST TECHNIQUE: Contiguous axial images were obtained from the base of the skull through the vertex without intravenous contrast. COMPARISON:  None. FINDINGS: Brain: No evidence of acute infarction, hemorrhage, hydrocephalus, extra-axial collection or mass lesion/mass effect. Vascular: No hyperdense vessel or unexpected calcification. Skull: Normal. Negative for fracture or focal lesion. Sinuses/Orbits: No acute finding. Other: None. IMPRESSION: No acute intracranial pathology. Electronically Signed   By: Eddie Candle M.D.   On: 10/07/2018 16:28   US Renal  Result Date: 10/09/2018 CLINICAL DATA:  Acute kidney injury. EXAM: RENAL / URINARY TRACT ULTRASOUND COMPLETE COMPARISON:  08/08/2018 FINDINGS: Right Kidney: Renal measurements: 11.2 x 4.4 x 5.8 cm = volume: 149 mL.  Echogenicity is diffusely increased. No hydronephrosis. Left Kidney: Renal measurements: 12.4 x 5.5 x 4.6 cm = volume: 163 mL. Diffusely increased echogenicity without hydronephrosis. Bladder: Decompressed by Foley catheter. Note: Small to moderate bilateral pleural effusions noted. IMPRESSION: 1. Bilateral echogenic kidneys without hydronephrosis. Imaging features compatible with medical renal disease. 2. Bilateral pleural effusions. Electronically Signed   By: Misty Stanley M.D.   On: 10/09/2018 20:03   Dg Chest Port 1 View  Result Date: 10/10/2018 CLINICAL DATA:  ET positioning EXAM: PORTABLE CHEST 1 VIEW COMPARISON:  Radiograph October 08, 2018 FINDINGS: Endotracheal tube is positioned in the mid trachea approximately 4 cm from the carina right IJ catheter terminates at the superior cavoatrial junction. Left IJ dual lumen catheter terminates at the caval-brachiocephalic confluence. Transesophageal tube tip and side port distal to the GE junction. Pacer pad overlies the left chest. Persistent bilateral airspace opacities demonstrate some interval clearing in the upper lobes. Basilar opacities are similar to prior. Cardiomediastinal silhouette is largely obscured by overlying opacity. No visible pneumothorax or discernible effusion. No acute osseous or soft tissue abnormality. IMPRESSION: 1. Satisfactory positioning of the lines and tubes, as above. 2. Persistent  bilateral airspace opacities with some interval clearing in the upper lobes. Electronically Signed   By: Lovena Le M.D.   On: 10/10/2018 05:27   Dg Chest Port 1 View  Result Date: 10/09/2018 CLINICAL DATA:  ARDS EXAM: PORTABLE CHEST 1 VIEW COMPARISON:  Radiograph 10/08/2018 FINDINGS: Pacer pads overlie the chest. Right IJ catheter tip terminates at the level of the superior cavoatrial junction. A left IJ dual lumen catheter tip terminates near the brachiocephalic-caval confluence. Endotracheal tube is positioned in the mid trachea, 3.8 cm from  the carina. A transesophageal tube tip and side port are distal to the GE junction. There are diffuse consolidative opacities throughout both lungs on a background of more hazy interstitial opacity. Suspect small right effusion. No pneumothorax. Cardiac silhouette is largely obscured by the overlying opacities. IMPRESSION: Extensive bilateral airspace opacities, similar in extent to most recent comparison. Stable, satisfactory positioning of lines and tubes, as above Electronically Signed   By: Lovena Le M.D.   On: 10/09/2018 05:40   Dg Chest Port 1 View  Result Date: 10/08/2018 CLINICAL DATA:  CVL placement EXAM: PORTABLE CHEST 1 VIEW COMPARISON:  10/08/2018, 10/07/2018, 10/05/2018 FINDINGS: Endotracheal tube tip is about 4.3 cm superior to the carina. Esophageal tube tip below the diaphragm but non included. Right IJ central venous catheter tip over the SVC. Left IJ central venous catheter tip over the upper SVC. No left pneumothorax. Extensive bilateral interstitial and alveolar disease. Obscured cardiomediastinal silhouette. Probable small effusions. IMPRESSION: 1. New left IJ central venous catheter with tip over the SVC. No pneumothorax. Support lines and tubes otherwise grossly stable in position 2. No significant change in extensive interstitial and alveolar disease bilaterally. Electronically Signed   By: Donavan Foil M.D.   On: 10/08/2018 14:06   Dg Chest Port 1 View  Result Date: 10/08/2018 CLINICAL DATA:  Check endotracheal tube placement EXAM: PORTABLE CHEST 1 VIEW COMPARISON:  10/07/2018 FINDINGS: Cardiac shadow is stable. Right jugular central line, gastric catheter and endotracheal tube are again seen. The endotracheal tube now lies 0.6 cm above the carina slightly withdrawn when compared with the prior exam. Persistent bilateral airspace opacities are noted worst in the right lung base stable from the previous day. Small right-sided effusion is noted superiorly. Bony abnormality is noted.  IMPRESSION: Tubes and lines as described above. Stable airspace opacities bilaterally. Electronically Signed   By: Inez Catalina M.D.   On: 10/08/2018 08:43   Portable Chest X-ray  Result Date: 10/07/2018 CLINICAL DATA:  Intubation EXAM: PORTABLE CHEST 1 VIEW COMPARISON:  October 06, 2018 FINDINGS: ET tube is 3 cm above the level of carina. Enteric tube is seen coursing below the diaphragm. A right-sided PICC is seen at the superior cavoatrial junction. Patchy airspace opacities are seen throughout both lungs, right greater than left. The cardiomediastinal silhouette is unchanged. IMPRESSION: ET tube 3 cm above the carina. Unchanged patchy airspace opacities, right greater than left. Electronically Signed   By: Prudencio Pair M.D.   On: 10/07/2018 10:05   Dg Chest Port 1 View  Result Date: 10/06/2018 CLINICAL DATA:  33 year old female with centralized placement. EXAM: PORTABLE CHEST 1 VIEW COMPARISON:  Chest radiograph dated 10/05/2018 FINDINGS: Right IJ central venous line with tip over the cavoatrial junction. No pneumothorax. Bilateral airspace opacities similar to prior radiograph. There is a small right pleural effusion. No acute osseous pathology. IMPRESSION: 1. Interval placement of a right IJ central venous line with tip in the region of the cavoatrial junction. No pneumothorax.  2. No significant interval change in the bilateral airspace opacities. Electronically Signed   By: Anner Crete M.D.   On: 10/06/2018 04:04   Dg Chest Port 1 View  Result Date: 10/02/2018 CLINICAL DATA:  Tachypnea EXAM: PORTABLE CHEST 1 VIEW COMPARISON:  Yesterday FINDINGS: Generalized reticular pulmonary opacity, present on multiple prior studies. No pleural fluid or pneumothorax. Normal heart size. IMPRESSION: Chronic reticular pulmonary opacity without acute superimposed finding. Electronically Signed   By: Monte Fantasia M.D.   On: 10/02/2018 05:45   Dg Chest Port 1 View  Result Date: 10/01/2018 CLINICAL DATA:   Cough, pancytopenia EXAM: PORTABLE CHEST 1 VIEW COMPARISON:  09/19/2018 FINDINGS: Stable cardiomediastinal contours. Extensive, diffuse reticulonodular opacities throughout both lungs, markedly progressed from prior. No pleural effusion. No pneumothorax. Osseous structures intact. IMPRESSION: Extensive reticulonodular opacities throughout both lungs suggestive of an atypical infection in an immunocompromised patient. Electronically Signed   By: Davina Poke M.D.   On: 10/01/2018 18:38   Vas [place] Lower Extremity Venous (dvt)  Result Date: 10/07/2018  Lower Venous Study Indications: SOB, and Edema.  Risk Factors: Advanced HIV/AIDS. Limitations: Lights on in room, patient newly vented. Comparison Study: Prior study from 06/20/17 is available for comparison Performing Technologist: Sharion Dove RVS  Examination Guidelines: A complete evaluation includes B-mode imaging, spectral Doppler, color Doppler, and power Doppler as needed of all accessible portions of each vessel. Bilateral testing is considered an integral part of a complete examination. Limited examinations for reoccurring indications may be performed as noted.  +---------+---------------+---------+-----------+----------+--------------+  RIGHT     Compressibility Phasicity Spontaneity Properties Thrombus Aging  +---------+---------------+---------+-----------+----------+--------------+  CFV       Full            Yes       Yes                                    +---------+---------------+---------+-----------+----------+--------------+  SFJ       Full                                                             +---------+---------------+---------+-----------+----------+--------------+  FV Prox   Full                                                             +---------+---------------+---------+-----------+----------+--------------+  FV Mid    Full                                                              +---------+---------------+---------+-----------+----------+--------------+  FV Distal Full                                                             +---------+---------------+---------+-----------+----------+--------------+  PFV       Full                                                             +---------+---------------+---------+-----------+----------+--------------+  POP       Full            Yes       Yes                                    +---------+---------------+---------+-----------+----------+--------------+  PTV       Full                                                             +---------+---------------+---------+-----------+----------+--------------+  PERO      Full                                                             +---------+---------------+---------+-----------+----------+--------------+   +---------+---------------+---------+-----------+----------+--------------+  LEFT      Compressibility Phasicity Spontaneity Properties Thrombus Aging  +---------+---------------+---------+-----------+----------+--------------+  CFV       Full            Yes       Yes                                    +---------+---------------+---------+-----------+----------+--------------+  SFJ       Full                                                             +---------+---------------+---------+-----------+----------+--------------+  FV Prox   Full                                                             +---------+---------------+---------+-----------+----------+--------------+  FV Mid    Full                                                             +---------+---------------+---------+-----------+----------+--------------+  FV Distal Full                                                             +---------+---------------+---------+-----------+----------+--------------+  PFV       Full                                                              +---------+---------------+---------+-----------+----------+--------------+  POP       Full            Yes       Yes                                    +---------+---------------+---------+-----------+----------+--------------+  PTV       Full                                                             +---------+---------------+---------+-----------+----------+--------------+  PERO                                                       Not visualized  +---------+---------------+---------+-----------+----------+--------------+     Summary: Right: Findings appear essentially unchanged compared to previous examination. There is no evidence of deep vein thrombosis in the lower extremity. Left: Findings appear essentially unchanged compared to previous examination. There is no evidence of deep vein thrombosis in the lower extremity. However, portions of this examination were limited- see technologist comments above.  *See table(s) above for measurements and observations. Electronically signed by Monica Martinez MD on 10/07/2018 at 6:03:03 PM.    Final    09/11/2018 Bone Marrow Biopsy      09/11/2018 [employer]   ASSESSMENT AND PLAN:  This is a 33 year old unfortunate African-American female with a very complex medical history and course with HIV AIDS CD4 count less than 50 on ART with recurrent admissions for sepsis and possible PJP with  1. Anemia-transfusion dependent 2. Thrombocytopenia-significant thrombocytopenia currently transfusion dependent 3.  Leukocytosis-with elevated procalcitonin levels and significantly elevated interleukin-6 level suggesting severe inflammation. 4.  PJP recently, recurrent sepsis, etiology of pulmonary infiltrates unclear 5.  HIV/AIDS on Biktarvy. On Atovaquone for PCP prophylaxis and Azithromycin for MAI prophylaxis. 6. Splenomegaly and some lymphadenopathy noted on previous imaging  7.  Acute respiratory failure currently intubated and on a mechanical  ventilator.   Patient with pancytopenia with extensive workup including 2 BM Bx's . Patients blood counts havent had a chance for sustained improvement on account of recurrent sepsis, medication causing BM suppression and cytopenia, HIV/AIDS related dysplastic changes on last BM Bx. She has had improvement in her condition with previous antibiotic treatments and brief use of steroids and PJP treatment. Bone marrow examination has not shown overt infectious etiology, was negative for MAI or other fungal processes. No overt evidence of lymphoma in the bone marrow biopsy. Lymphadenopathy and splenomegaly could be from her HIV AIDS as well as recurrent infections. Currently cultures remain negative and patient is on empiric treatment for healthcare acquired pneumonia.  Significantly elevated interleukin-6 levels suggest severe inflammatory state.  This could  be from recurrent infections or macrophage activation syndrome related to HIV. Also has elevated ferritin level in the setting of significant transfusion history and from acute inflammation. Triglyceride levels have fluctuated the last ones are elevated. LDH levels are elevated but this could be in the setting of alternative pulmonary process. She has had very poor nutritional status on account of recurrent admissions sepsis AIDS and other considerations.  This can also play into her bone marrow suppression.  She has had increasing blood CMV titers which infectious disease is aware of but do not feel this is the primary driver of her lung findings or bone marrow suppression.  They have also sent out parvovirus testing to evaluate for this is a sign of bone marrow suppression.  Patient did not respond to IVIG previously to suggest immune cytopenias. -Has not responded much to Nplate for possible immune thrombocytopenia.  Plan -The patient has a very complicated medical presentation with multiple possible etiologies for her cytopenias. -She had  elevated procalcitonin levels which could suggest a bacterial process and she is on empiric treatment initially for PGP and now with cefepime for healthcare acquired pneumonia. -Procalcitonin elevation can also occur in the setting of severe cytokine release and severe inflammation such as can happen with secondary HL H in the setting of HIV and recurrent infections. -There was no overt evidence of hemophagocytosis on her latest bone marrow biopsy but this alone is not necessary for a diagnosis. -Though there are several other potential explanations for some of her findings and the presence of negative cultures and infectious disease team not thinking of an overt CMV or other viral process as an etiology of her presentation and given significant inflammatory marker elevation hyper ferritin anemia hypertriglyceridemia and previously elevated liver functions and other potential organ dysfunction from cytokine release cannot rule out the possibility of secondary HLH syndrome. -Previously had recommended consideration of transfer to tertiary care facility if consistent with family's wishes and goals of care and palliative care involvement. -Supportive transfusions for hgb<7.5 and PLT<20k in the setting of sepsis. -Ordered soluble CD 25 testing on peripheral blood. -As a last ditch effort in this critically ill patient with grave prognosis, in the absence of any other overt infectious etiology will plan to treat as secondary [place] with high-dose dexamethasone and if tolerated and stable subsequent addition of etoposide.  This certainly increases her risk of worsening sepsis or viral processes in the setting of immunosuppression from HIV. -We will switch to dexamethasone from Solu-Medrol at about 10 mg/m for 3 to 4 days and then gradually taper. -We will plan to add low-dose etoposide in 3-4 days if no concerns with worsening sepsis. -We shall trend ferritin triglyceride LDH blood counts to evaluate for  improvement. -Even if patient improved long-term prognosis would remain concerning and she will need to pursue hematologic cares at a tertiary care facility for continued management of her cytopenias and possible secondary [place] depending on goals of care. -Would recommend palliative care involvement for ongoing goals of care discussion with the family in the setting of patient's poor overall health and critical illness. -Unclear role of Nplate at this time there was no clear improvement in her platelet counts with Nplate and given broader cytopenias this is unlikely to be just immune thrombocytopenia.  We will hold off on Nplate at this time. -I had a detailed discussion for more than 45 minutes with the patient's husband Krystal Short to give him a detailed understanding of the patient's critical condition and grave  prognosis and all the lab findings and clinical course during this hospitalization.  He also had multiple questions about her previous admissions which were addressed to the best of my ability. -He is aware and is consenting for an approach to treat this as secondary [place].  We discussed starting the high-dose dexamethasone and reassessing her cytopenias and response to treatment by Tuesday, 10/15/2018 to determine if it would be reasonable to consider treating with etoposide based on absence of any other fevers or infectious processes and depending on her cytopenias.  Total time spent 70 minutes more than 50% of the time on direct patient evaluation and discussion with patient's husband.

## 2018-10-11 NOTE — Progress Notes (Signed)
[facility] Progress Note  Subjective: on vent, on CRRT no issues overnight w/ filters - down to 1 pressor - FiO2 stable 50% - I/O even yesterday, labs OK , phos borderline - UOP none yest - Wt 57kg (admit 51kg, peak 62kg)  Vitals:   10/11/18 1528 10/11/18 1530 10/11/18 1538 10/11/18 1600  BP: 105/63 103/66    Pulse: 72 73    Resp: (!) 30 (!) 30    Temp: 98.6 F (37 C) 98.6 F (37 C)    TempSrc: Bladder   Bladder  SpO2: 97% 97% 98%   Weight:      Height:        Inpatient medications: . atovaquone  1,500 mg Oral Daily  . azithromycin  1,200 mg Oral Q Tue  . benzonatate  100 mg Oral TID  . chlorhexidine  15 mL Mouth Rinse BID  . chlorhexidine gluconate (MEDLINE KIT)  15 mL Mouth Rinse BID  . Chlorhexidine Gluconate Cloth  6 each Topical Daily  . dexamethasone  20 mg Intravenous Daily  . emtricitabine-tenofovir AF  1 tablet Per Tube Daily   And  . dolutegravir  50 mg Oral Daily  . feeding supplement (PRO-STAT SUGAR FREE 64)  30 mL Per Tube BID  . guaiFENesin-dextromethorphan  7.5 mL Per Tube Q6H  . insulin aspart  2-6 Units Subcutaneous Q4H  . mouth rinse  15 mL Mouth Rinse 10 times per day  . metoCLOPramide (REGLAN) injection  5 mg Intravenous Once  . mometasone-formoterol  2 puff Inhalation BID  . pantoprazole sodium  40 mg Per Tube Daily  . sodium chloride flush  10-40 mL Intracatheter Q12H   .  prismasol BGK 4/2.5 400 mL/hr at 10/11/18 0216  .  prismasol BGK 4/2.5 200 mL/hr at 10/11/18 1524  . sodium chloride 500 mL (10/10/18 1246)  . sodium chloride    . anticoagulant sodium citrate    . ceFEPime (MAXIPIME) IV Stopped (10/11/18 9604)  . dexmedetomidine (PRECEDEX) IV infusion 0.4 mcg/kg/hr (10/11/18 1340)  . feeding supplement (VITAL HIGH PROTEIN) 1,000 mL (10/11/18 0856)  . fentaNYL infusion INTRAVENOUS 50 mcg/hr (10/11/18 1700)  . norepinephrine (LEVOPHED) Adult infusion 3 mcg/min (10/11/18 1700)  . prismasol BGK 4/2.5 1,400 mL/hr at 10/11/18  1519  . vasopressin (PITRESSIN) infusion - *FOR SHOCK* Stopped (10/10/18 1546)   sodium chloride, Place/Maintain arterial line **AND** sodium chloride, acetaminophen (TYLENOL) oral liquid 160 mg/5 mL, albuterol, alteplase, anticoagulant sodium citrate, fentaNYL, midazolam, sodium chloride, sodium chloride flush    Exam: Gen intubated and sedated L IJ temp hd cath Sclera anicteric, throat w ett  No jvd or bruits Chest coarse rales/ rhonchi bilat  RRR no MRG, tachy Abd soft ntnd no mass or ascites +bs GU foley draining dark brown urine minimal amts MS no joint effusions or deformity Ext 1-2+ bilat LE/UE edema, no wounds or ulcers Neuro is sedated as above    Home meds:  - atovaquone 1.5 gm qd/ azithromycin 1.2 gm weekly  - bictegravir-emtricitabine-tenofovir qd  - doxycyxline 100 bid/ cefdinir 300 bid  - prednisone taper 40 qd > 10 qd     UA 8/25 negative UA  UNa 33, UCreat 94    Renal US 9/2 >  11- 12 cm kidneys w/ ^'d echo and no hydro bilat    Assessment: 1. AKI - due to shock/ hypoperfusion w/ ATN. CRRT started 10/08/18. Mild -mod edema on exam. Remains in shock on pressors. Wt's down slightly , keeping even. Cont CRRT.  2. Shock /  Hypotension - pressors down to 1 3. Hyperkalemia - resolved 4. Metabolic acidosis - resolved 5. Recurrent PNA/ resp failure - Per ID  6. AIDS /HIV 7. Severe thrombocytopenia   Plan - as above.     [facility] 10/11/2018, 5:07 PM  Iron/TIBC/Ferritin/ %Sat    Component Value Date/Time   IRON 15 (L) 09/05/2018 0640   TIBC 146 (L) 09/05/2018 0640   FERRITIN >7,500 (H) 10/11/2018 1227   IRONPCTSAT 10 (L) 09/05/2018 0640   Recent Labs  Lab 10/11/18 0455 10/11/18 1227  NA 139  --   K 3.5  --   CL 105  --   CO2 25  --   GLUCOSE 207*  --   BUN 27*  --   CREATININE 0.94  --   CALCIUM 7.1*  --   PHOS 1.7*  --   ALBUMIN 1.7* 1.9*  INR  --  1.3*   Recent Labs  Lab 10/11/18 1227  AST 56*  ALT  21  ALKPHOS 374*  BILITOT 3.8*  PROT 5.7*   Recent Labs  Lab 10/11/18 0455 10/11/18 1227  WBC 15.0*  --   HGB 7.9*  --   HCT 23.9*  --   PLT 7* 6*

## 2018-10-12 ENCOUNTER — Inpatient Hospital Stay (HOSPITAL_COMMUNITY): Payer: 59

## 2018-10-12 LAB — CBC WITH DIFFERENTIAL/PLATELET
Abs Immature Granulocytes: 2.59 10*3/uL — ABNORMAL HIGH (ref 0.00–0.07)
Basophils Absolute: 0.1 10*3/uL (ref 0.0–0.1)
Basophils Relative: 0 %
Eosinophils Absolute: 0 10*3/uL (ref 0.0–0.5)
Eosinophils Relative: 0 %
HCT: 23.3 % — ABNORMAL LOW (ref 36.0–46.0)
Hemoglobin: 7.6 g/dL — ABNORMAL LOW (ref 12.0–15.0)
Immature Granulocytes: 15 %
Lymphocytes Relative: 8 %
Lymphs Abs: 1.5 10*3/uL (ref 0.7–4.0)
MCH: 30 pg (ref 26.0–34.0)
MCHC: 32.6 g/dL (ref 30.0–36.0)
MCV: 92.1 fL (ref 80.0–100.0)
Monocytes Absolute: 0.6 10*3/uL (ref 0.1–1.0)
Monocytes Relative: 3 %
Neutro Abs: 13.1 10*3/uL — ABNORMAL HIGH (ref 1.7–7.7)
Neutrophils Relative %: 74 %
Platelets: 11 10*3/uL — CL (ref 150–400)
RBC: 2.53 MIL/uL — ABNORMAL LOW (ref 3.87–5.11)
RDW: 17.3 % — ABNORMAL HIGH (ref 11.5–15.5)
WBC: 17.8 10*3/uL — ABNORMAL HIGH (ref 4.0–10.5)
nRBC: 18.3 % — ABNORMAL HIGH (ref 0.0–0.2)

## 2018-10-12 LAB — GLUCOSE, CAPILLARY
Glucose-Capillary: 179 mg/dL — ABNORMAL HIGH (ref 70–99)
Glucose-Capillary: 179 mg/dL — ABNORMAL HIGH (ref 70–99)
Glucose-Capillary: 172 mg/dL — ABNORMAL HIGH (ref 70–99)
Glucose-Capillary: 209 mg/dL — ABNORMAL HIGH (ref 70–99)
Glucose-Capillary: 201 mg/dL — ABNORMAL HIGH (ref 70–99)

## 2018-10-12 LAB — RENAL FUNCTION PANEL
Albumin: 1.8 g/dL — ABNORMAL LOW (ref 3.5–5.0)
Albumin: 1.8 g/dL — ABNORMAL LOW (ref 3.5–5.0)
Anion gap: 10 (ref 5–15)
Anion gap: 6 (ref 5–15)
BUN: 27 mg/dL — ABNORMAL HIGH (ref 6–20)
BUN: 30 mg/dL — ABNORMAL HIGH (ref 6–20)
CO2: 25 mmol/L (ref 22–32)
CO2: 25 mmol/L (ref 22–32)
Calcium: 7.5 mg/dL — ABNORMAL LOW (ref 8.9–10.3)
Calcium: 7.1 mg/dL — ABNORMAL LOW (ref 8.9–10.3)
Chloride: 102 mmol/L (ref 98–111)
Chloride: 106 mmol/L (ref 98–111)
Creatinine, Ser: 0.9 mg/dL (ref 0.44–1.00)
Creatinine, Ser: 0.87 mg/dL (ref 0.44–1.00)
GFR calc Af Amer: >60 mL/min (ref >60)
GFR calc Af Amer: >60 mL/min (ref >60)
GFR calc non Af Amer: >60 mL/min (ref >60)
GFR calc non Af Amer: >60 mL/min (ref >60)
Glucose, Bld: 191 mg/dL — ABNORMAL HIGH (ref 70–99)
Glucose, Bld: 223 mg/dL — ABNORMAL HIGH (ref 70–99)
Phosphorus: 1.3 mg/dL — ABNORMAL LOW (ref 2.5–4.6)
Phosphorus: 1 mg/dL — CL (ref 2.5–4.6)
Potassium: 3.4 mmol/L — ABNORMAL LOW (ref 3.5–5.1)
Potassium: 4.1 mmol/L (ref 3.5–5.1)
Sodium: 137 mmol/L (ref 135–145)
Sodium: 137 mmol/L (ref 135–145)

## 2018-10-12 LAB — COMPREHENSIVE METABOLIC PANEL
ALT: 25 U/L (ref 0–44)
AST: 57 U/L — ABNORMAL HIGH (ref 15–41)
Albumin: 1.8 g/dL — ABNORMAL LOW (ref 3.5–5.0)
Alkaline Phosphatase: 406 U/L — ABNORMAL HIGH (ref 38–126)
Anion gap: 11 (ref 5–15)
BUN: 27 mg/dL — ABNORMAL HIGH (ref 6–20)
CO2: 25 mmol/L (ref 22–32)
Calcium: 7.5 mg/dL — ABNORMAL LOW (ref 8.9–10.3)
Chloride: 102 mmol/L (ref 98–111)
Creatinine, Ser: 0.9 mg/dL (ref 0.44–1.00)
GFR calc Af Amer: >60 mL/min (ref >60)
GFR calc non Af Amer: >60 mL/min (ref >60)
Glucose, Bld: 179 mg/dL — ABNORMAL HIGH (ref 70–99)
Potassium: 3.4 mmol/L — ABNORMAL LOW (ref 3.5–5.1)
Sodium: 138 mmol/L (ref 135–145)
Total Bilirubin: 3.4 mg/dL — ABNORMAL HIGH (ref 0.3–1.2)
Total Protein: 5.5 g/dL — ABNORMAL LOW (ref 6.5–8.1)

## 2018-10-12 LAB — MAGNESIUM: Magnesium: 2.5 mg/dL — ABNORMAL HIGH (ref 1.7–2.4)

## 2018-10-12 LAB — C-REACTIVE PROTEIN: CRP: 13.5 mg/dL — ABNORMAL HIGH (ref <1.0)

## 2018-10-12 LAB — HAPTOGLOBIN: Haptoglobin: 92 mg/dL (ref 33–278)

## 2018-10-12 LAB — APTT: aPTT: 32 seconds (ref 24–36)

## 2018-10-12 MED ORDER — POTASSIUM CHLORIDE 10 MEQ/50ML IV SOLN
10.0000 meq | INTRAVENOUS | Status: AC
  Administered 2018-10-12 (×4): 10 meq via INTRAVENOUS
  Filled 2018-10-12 (×4): qty 50

## 2018-10-12 MED ORDER — SODIUM PHOSPHATES 45 MMOLE/15ML IV SOLN
30.0000 mmol | Freq: Once | INTRAVENOUS | Status: AC
  Administered 2018-10-12: 18:00:00 30 mmol via INTRAVENOUS
  Filled 2018-10-12: qty 10

## 2018-10-12 NOTE — Progress Notes (Signed)
[facility] Progress Note  Subjective: on vent, on CRRT no issues overnight w/ filters   Vitals:   10/12/18 1000 10/12/18 1030 10/12/18 1100 10/12/18 1130  BP: (!) 87/60 100/63 100/67 111/80  Pulse: 82 89 86 91  Resp: (!) 30 (!) 30 (!) 30 (!) 30  Temp: (!) 97.3 F (36.3 C) (!) 97 F (36.1 C) (!) 97 F (36.1 C) (!) 97 F (36.1 C)  TempSrc:   Core   SpO2: 95% 100% 100% 99%  Weight:      Height:        Inpatient medications: . atovaquone  1,500 mg Oral Daily  . azithromycin  1,200 mg Oral Q Tue  . benzonatate  100 mg Oral TID  . chlorhexidine  15 mL Mouth Rinse BID  . chlorhexidine gluconate (MEDLINE KIT)  15 mL Mouth Rinse BID  . Chlorhexidine Gluconate Cloth  6 each Topical Daily  . dexamethasone  20 mg Intravenous Daily  . emtricitabine-tenofovir AF  1 tablet Per Tube Daily   And  . dolutegravir  50 mg Oral Daily  . feeding supplement (PRO-STAT SUGAR FREE 64)  30 mL Per Tube BID  . guaiFENesin-dextromethorphan  7.5 mL Per Tube Q6H  . insulin aspart  2-6 Units Subcutaneous Q4H  . mouth rinse  15 mL Mouth Rinse 10 times per day  . metoCLOPramide (REGLAN) injection  5 mg Intravenous Once  . mometasone-formoterol  2 puff Inhalation BID  . pantoprazole sodium  40 mg Per Tube Daily  . sodium chloride flush  10-40 mL Intracatheter Q12H   .  prismasol BGK 4/2.5 400 mL/hr at 10/11/18 0216  .  prismasol BGK 4/2.5 200 mL/hr at 10/12/18 0414  . sodium chloride 10 mL/hr at 10/11/18 1737  . sodium chloride    . anticoagulant sodium citrate    . ceFEPime (MAXIPIME) IV Stopped (10/12/18 6269)  . dexmedetomidine (PRECEDEX) IV infusion 0.4 mcg/kg/hr (10/12/18 0504)  . feeding supplement (VITAL HIGH PROTEIN) 1,000 mL (10/12/18 0906)  . fentaNYL infusion INTRAVENOUS 50 mcg/hr (10/12/18 1100)  . norepinephrine (LEVOPHED) Adult infusion Stopped (10/12/18 0749)  . prismasol BGK 4/2.5 1,400 mL/hr at 10/12/18 0918  . vasopressin (PITRESSIN) infusion - *FOR SHOCK* Stopped  (10/10/18 1546)   sodium chloride, Place/Maintain arterial line **AND** sodium chloride, acetaminophen (TYLENOL) oral liquid 160 mg/5 mL, albuterol, alteplase, anticoagulant sodium citrate, fentaNYL, midazolam, sodium chloride, sodium chloride flush    Exam: Gen intubated and sedated L IJ temp hd cath Sclera anicteric, throat w ett  No jvd or bruits Chest coarse rales/ rhonchi bilat  RRR no MRG, tachy Abd soft ntnd no mass or ascites +bs GU foley draining dark brown urine minimal amts MS no joint effusions or deformity Ext 1-2+ bilat LE/UE edema, no wounds or ulcers Neuro is sedated as above    Home meds:  - atovaquone 1.5 gm qd/ azithromycin 1.2 gm weekly  - bictegravir-emtricitabine-tenofovir qd  - doxycyxline 100 bid/ cefdinir 300 bid  - prednisone taper 40 qd > 10 qd     UA 8/25 negative UA  UNa 33, UCreat 94    Renal US 9/2 >  11- 12 cm kidneys w/ ^'d echo and no hydro bilat  Yest/ today - min pressor support - 1.4 L out yest net - FiO2 down 40% - Wt 57kg (admit 51kg, peak 62kg)  Assessment: 1. AKI - due to shock/ hypoperfusion w/ ATN. CRRT started 10/08/18. Mild -mod edema on exam. Shock improving, mostly off pressors. Starting to  tolerate UF. Cont CRRT, ok to ^UF 50- 100 cc/hr as tolerated.  2. Shock /  Hypotension - pressors 0- 1 today 3. Hyperkalemia - resolved 4. Metabolic acidosis - resolved 5. Recurrent PNA/ resp failure - Per ID  6. AIDS /HIV 7. Severe thrombocytopenia   Plan - as above.     [facility] 10/12/2018, 11:43 AM  Iron/TIBC/Ferritin/ %Sat    Component Value Date/Time   IRON 15 (L) 09/05/2018 0640   TIBC 146 (L) 09/05/2018 0640   FERRITIN >7,500 (H) 10/11/2018 1227   IRONPCTSAT 10 (L) 09/05/2018 0640   Recent Labs  Lab 10/11/18 1227  10/12/18 0407 10/12/18 0910  NA  --    < > 137 138  K  --    < > 3.4* 3.4*  CL  --    < > 102 102  CO2  --    < > 25 25  GLUCOSE  --    < > 191* 179*  BUN  --    <  > 27* 27*  CREATININE  --    < > 0.90 0.90  CALCIUM  --    < > 7.5* 7.5*  PHOS  --    < > 1.3*  --   ALBUMIN 1.9*   < > 1.8* 1.8*  INR 1.3*  --   --   --    < > = values in this interval not displayed.   Recent Labs  Lab 10/12/18 0910  AST 57*  ALT 25  ALKPHOS 406*  BILITOT 3.4*  PROT 5.5*   Recent Labs  Lab 10/12/18 0407  WBC 17.8*  HGB 7.6*  HCT 23.3*  PLT 11*

## 2018-10-12 NOTE — Progress Notes (Addendum)
NAME:  Krystal Short, MRN:  [id number], DOB:  1985-09-30, LOS: 6 ADMISSION DATE:  10/05/2018, CONSULTATION DATE:  10/06/2018 REFERRING MD:  Dr. Craig Guess, CHIEF COMPLAINT:  SOB   Brief History   Mrs. Krystal Short is a 33 yo F with history of HIV/AIDS admitted on 8/29 with progressive shortness of breath. History of multiple admissions over the last year for waxing / waning pulmonary infiltrates. Unfortunately patient decompensated on 8/31 requiring initiation of mechanical intubation and later pressure support and CRRT.    Recent Evaluation  03/08/18-03/10/18 Acute respiratory failure secondary to pneumonia (rhinovirus). tx with rocephin/ zithromax, Bactrim for PCP. Macrocytic anemia   04/22/2018- Flu A infection. Treated with tamiflu as outpatient  05/28/18- 05/31/18 admitted with respiratory failure, lung infiltrates.  Treated with Bactrim, prednisone with improvement  06/14/2018-07/16/2018 Bordetella bronchoseptica infection. Underwent FOB 07/12/2018 s PCP studiesBiopsy scant lung parenchyma with fibrosis and reactive changes. Required 4 PRBC and 3 platelet infusion.  07/31/2018-08/22/2018 sepsis from suspected PJP pneumonia. Treated with steroids clindamycin and primaquine. Required 8 PRBC and 7 platelet transfusion. RVP positive for routine coronavirus.  09/03/2018-09/13/2018 generalized weakness and deconditioning. Required 3 PRBC 8 platelet transfusion.  7/30/2020IVIGwithout any improvement in platelet count. Given prolonged prednisone taper.  09/11/18 Bone marrowBx all micro studies neg   10/01/2018-10/04/2018 presented with anemia and poor IV access. Given 2 PRBC 1 platelet transfusion. Required pressors. Treated with ceftriaxone and doxy for ? CAP.  Significant Hospital Events   8/29 Admit  8/30 PCCM consulted,started solumedrol 8/30 am @ 80 mg q 12h 8/31 Intubated, FOB 9/1 CRRT  9/5 Weaning pressors  Consults:  PCCM 8/30 ID 8/30 Hem/Onc 8/30 Nephrology 9/1  Procedures:   R IJ CVC 8/30 >> ETT 8/31 >> FOB 8/31 >> L HD cath 9/1 >>  Significant Diagnostic Tests:  Head CT 8/31 >> negative for any acute intracranial process Renal ultrasound 9/2 >> No hydronephrosis, medical renal disease   Micro Data:  COVID 19 pcr 8/29>>neg BCx 2 8/29 >>no growth 2 days PCP8/31>>neg 9/1 FOB BAL 8/31 >> 50,000 Coag neg staph, PJP negative  Antimicrobials:  Bactrim 8/29 >>8/30 Maxepime 8/29 >> PO Atovaquone 8/30 >> PO Azithromycin 9/1 >>  Interim history/subjective:  Afebrile.  She been weaned off Levophed though she needed that intermittently overnight when she got sedation medication. Continues on CVVH with that negative fluid output PEEP/FiO2 weaning down. Transfused 1 unit platelets yesterday.  Objective   Blood pressure 93/60, pulse 82, temperature (!) 97.5 F (36.4 C), temperature source Core, resp. rate (!) 30, height _0  (1.549 m), weight 55.8 kg, SpO2 100 %.    Vent Mode: PRVC FiO2 (%):  [40 %-50 %] 40 % Set Rate:  [30 bmp] 30 bmp Vt Set:  [420 mL] 420 mL PEEP:  [8 cmH20-10 cmH20] 8 cmH20 Plateau Pressure:  [21 cmH20-33 cmH20] 26 cmH20   Intake/Output Summary (Last 24 hours) at 10/12/2018 0828 Last data filed at 10/12/2018 0800 Gross per 24 hour  Intake 2305.27 ml  Output 3761 ml  Net -1455.73 ml   Filed Weights   10/10/18 0306 10/11/18 0452 10/12/18 0500  Weight: 58.3 kg 57.7 kg 55.8 kg    Examination: Gen:      No acute distress HEENT:  EOMI, sclera anicteric, ET tube Neck:     No masses; no thyromegaly Lungs:    Clear to auscultation bilaterally; normal respiratory effort CV:         Regular rate and rhythm; no murmurs Abd:      +  bowel sounds; soft, non-tender; no palpable masses, no distension Ext:    1+ edema; adequate peripheral perfusion Skin:      Warm and dry; no rash Neuro: Opens eyes to commands when off sedation.  Chest x-ray 9/4-personally reviewed showing improvement in pulmonary infiltrates.  Resolved Hospital  Problem list     Assessment & Plan:  AcuteRecurrentHypoxemic Respiratory Failure -In the setting of advanced HIV/AIDS now with Staph Epid seen on BAL with bloody secretion P: Wean down PEEP/FiO2 We will start pressure support weans as tolerated Follow intermittent chest x-ray  RecurrentBilateralPulmonaryInfiltrates -Staph Epid seen on BAL, PJP neg. BC with no growth at 4 days  P: ABX per ID   Acute encephalopathy  -In the setting of advanced HIV/AIDS, respiratory and renal failure, and profound thrombocytopenia. Head CT neg for any acute abnormalities  P: Wean down sedation  ProfoundPersistentThrombocytopenia, anemia -Extensive workup completed by Hemo/Onc with BM BX suggesting normal bone marrow. HIT ab negative. Currently on steroids, initially started for possible sarcoid but no granulomas on prior biospy P: Appreciate input from Dr. Irene Limbo, Hematology Still unclear as to why she has had recurrent admissions with lung infiltrates, organ failure, pancytopenia The most likely explanation is this is secondary to HIV infection, inflammatory state. Possibly could be secondary Holt from HIV or viral infections (noted to have flu infection, coronavirus infection earlier this year).  She has been ruled out several times for SARS-CoV-2 by PCR. We will check SARS-CoV-2 IgG She does meet several of the criteria is for HLH including hepatosplenomegaly, cytopenias, elevated LDH, triglycerides, elevated LFTs though there is no hemophagocytosis on bone marrow Awaiting soluble IL2Ra levels On increased dose of steroids yesterday and will consider etoposide next week If no improvement then consider transfer to a tertiary facility. Discussed with family 9/5  AKI AGMA Hyperkalemia  Hypocalcemia  Likelyrelated to volume depletion vs drug side effects, no evidence of a pulmonary/renal syndrome. Currently on CRRT 2.8L out with 310 urine output seen overnight. FENa 0.5%, renal ultrasound  neg P: Continue CRRT Volume removal as able.  She still appears volume overloaded on examination. Suspect some of the pulmonary infiltrates may be due to pulmonary edema  CirculatoryShock -likely some component of sedation, +/- sepsis  P: Weaned off pressors.  HIV  HIV viral load undetectable but CD4 count remains low   P: Antibiotics and antiretroviral medication per ID.  Best practice:  Diet:TF via OG tube Pain/Anxiety/Delirium protocol (if indicated):Fentanyl dripand Precedex VAP protocol (if indicated):In place DVT prophylaxis:SCD's, allergy to heparin GI prophylaxis:PPI Glucose control:N/A Mobility:BR Code Status:Full Family Communication:Both husband and mom updated daily at bedside and continue to express desire for aggressive measures    Disposition:ICU  Labs   CBC: Recent Labs  Lab 10/08/18 0335 10/09/18 0417 10/10/18 0424 10/10/18 1701 10/11/18 0455 10/11/18 1227 10/12/18 0407  WBC 24.7* 13.0* 14.6*  --  15.0*  --  17.8*  NEUTROABS  --   --   --   --  12.6*  --  13.1*  HGB 9.8* 8.0* 7.0* 8.5* 7.9*  --  7.6*  HCT 29.6* 23.3* 21.0* 25.3* 23.9*  --  23.3*  MCV 91.4 87.9 90.1  --  90.5  --  92.1  PLT 12* 7* 5*  --  7* 6* 11*    Basic Metabolic Panel: Recent Labs  Lab 10/08/18 0335  10/09/18 0417  10/10/18 0424 10/10/18 1701 10/11/18 0455 10/11/18 1840 10/12/18 0407  NA 144   < > 139   < > 137  139 139 138 137  K 7.0*   < > 4.8   < > 4.5 4.0 3.5 3.2* 3.4*  CL 116*   < > 105   < > 103 104 105 104 102  CO2 14*   < > 23   < > _0 GLUCOSE 157*   < > 138*   < > 120* 206* 207* 209* 191*  BUN 92*   < > 47*   < > 24* 23* 27* 27* 27*  CREATININE 2.42*   < > 1.35*   < > 0.97 0.92 0.94 0.91 0.90  CALCIUM 6.3*   < > 6.7*   < > 7.2* 7.3* 7.1* 7.3* 7.5*  MG 2.8*  --  2.4  --  2.6*  --  2.4  --  2.5*  PHOS 11.8*   < > 5.7*   < > 3.5  3.6 2.8 1.7* 1.3* 1.3*   < > = values in this interval not displayed.   GFR: Estimated Creatinine  Clearance: 68.3 mL/min (by C-G formula based on SCr of 0.9 mg/dL). Recent Labs  Lab 10/05/18 2313 10/06/18 0113  10/08/18 0105  10/09/18 0417 10/10/18 0424 10/11/18 0455 10/12/18 0407  WBC  --   --    < >  --    < > 13.0* 14.6* 15.0* 17.8*  LATICACIDVEN 2.6* 1.2  --  1.2  --   --   --   --   --    < > = values in this interval not displayed.    Liver Function Tests: Recent Labs  Lab 10/07/18 1209 10/08/18 0956 10/08/18 1053  10/10/18 1701 10/11/18 0455 10/11/18 1227 10/11/18 1840 10/12/18 0407  AST 119* 110* 129*  --   --   --  56*  --   --   ALT _1 --   --   --  21  --   --   ALKPHOS 187* 254* 291*  --   --   --  374*  --   --   BILITOT 3.5* 3.7* 4.4*  --   --   --  3.8*  --   --   PROT 6.0* 4.4* 5.1*  --   --   --  5.7*  --   --   ALBUMIN 2.0* 1.4* 1.5*   < > 1.8* 1.7* 1.9* 1.9* 1.8*   < > = values in this interval not displayed.   No results for input(s): LIPASE, AMYLASE in the last 168 hours. No results for input(s): AMMONIA in the last 168 hours.  ABG    Component Value Date/Time   PHART 7.393 10/10/2018 0420   PCO2ART 43.2 10/10/2018 0420   PO2ART 292 (H) 10/10/2018 0420   HCO3 26.0 10/10/2018 0420   TCO2 15 (L) 10/06/2018 0300   ACIDBASEDEF 10.9 (H) 10/08/2018 1139   O2SAT 99.6 10/10/2018 0420     Coagulation Profile: Recent Labs  Lab 10/07/18 1800 10/11/18 1227  INR 1.9* 1.3*    Cardiac Enzymes: No results for input(s): CKTOTAL, CKMB, CKMBINDEX, TROPONINI in the last 168 hours.  HbA1C: Hgb A1c MFr Bld  Date/Time Value Ref Range Status  08/09/2018 02:28 AM 5.5 4.8 - 5.6 % Final    Comment:    (NOTE) Pre diabetes:          5.7%-6.4% Diabetes:              >6.4% Glycemic control for   <7.0% adults  with diabetes   08/02/2018 06:46 PM 4.7 (L) 4.8 - 5.6 % Final    Comment:    (NOTE) Pre diabetes:          5.7%-6.4% Diabetes:              >6.4% Glycemic control for   <7.0% adults with diabetes     CBG: Recent Labs  Lab  10/11/18 1538 10/11/18 1935 10/11/18 2355 10/12/18 0405 10/12/18 0724  GLUCAP 187* 192* 218* 179* 179*     Critical care time: 40    The patient is critically ill with multiple organ system failure and requires high complexity decision making for assessment and support, frequent evaluation and titration of therapies, advanced monitoring, review of radiographic studies and interpretation of complex data.   Critical Care Time devoted to patient care services, exclusive of separately billable procedures, described in this note is 45 minutes.   Marshell Garfinkel MD [facility] Pager [phone] If no answer call [phone] 10/12/2018, 8:32 AM

## 2018-10-12 NOTE — Progress Notes (Signed)
CRITICAL VALUE ALERT  Critical Value:  Phosphorus 1.0  Date & Time Notied:  10/12/2018  Provider Notified: Dr. Vaughan Browner  Orders Received/Actions taken: per Dr. Vaughan Browner, he will place order for Ph replacement

## 2018-10-13 ENCOUNTER — Inpatient Hospital Stay (HOSPITAL_COMMUNITY): Payer: 59

## 2018-10-13 LAB — RENAL FUNCTION PANEL
Albumin: 1.9 g/dL — ABNORMAL LOW (ref 3.5–5.0)
Albumin: 2 g/dL — ABNORMAL LOW (ref 3.5–5.0)
Anion gap: 13 (ref 5–15)
Anion gap: 9 (ref 5–15)
BUN: 33 mg/dL — ABNORMAL HIGH (ref 6–20)
BUN: 37 mg/dL — ABNORMAL HIGH (ref 6–20)
CO2: 24 mmol/L (ref 22–32)
CO2: 25 mmol/L (ref 22–32)
Calcium: 7.5 mg/dL — ABNORMAL LOW (ref 8.9–10.3)
Calcium: 7.1 mg/dL — ABNORMAL LOW (ref 8.9–10.3)
Chloride: 100 mmol/L (ref 98–111)
Chloride: 102 mmol/L (ref 98–111)
Creatinine, Ser: 0.98 mg/dL (ref 0.44–1.00)
Creatinine, Ser: 0.99 mg/dL (ref 0.44–1.00)
GFR calc Af Amer: >60 mL/min (ref >60)
GFR calc Af Amer: >60 mL/min (ref >60)
GFR calc non Af Amer: >60 mL/min (ref >60)
GFR calc non Af Amer: >60 mL/min (ref >60)
Glucose, Bld: 168 mg/dL — ABNORMAL HIGH (ref 70–99)
Glucose, Bld: 270 mg/dL — ABNORMAL HIGH (ref 70–99)
Phosphorus: 2.3 mg/dL — ABNORMAL LOW (ref 2.5–4.6)
Phosphorus: 3.6 mg/dL (ref 2.5–4.6)
Potassium: 3.6 mmol/L (ref 3.5–5.1)
Potassium: 4.2 mmol/L (ref 3.5–5.1)
Sodium: 137 mmol/L (ref 135–145)
Sodium: 136 mmol/L (ref 135–145)

## 2018-10-13 LAB — CBC
HCT: 20.1 % — ABNORMAL LOW (ref 36.0–46.0)
Hemoglobin: 6.6 g/dL — CL (ref 12.0–15.0)
MCH: 30.7 pg (ref 26.0–34.0)
MCHC: 32.8 g/dL (ref 30.0–36.0)
MCV: 93.5 fL (ref 80.0–100.0)
Platelets: 10 10*3/uL — CL (ref 150–400)
RBC: 2.15 MIL/uL — ABNORMAL LOW (ref 3.87–5.11)
RDW: 17.4 % — ABNORMAL HIGH (ref 11.5–15.5)
WBC: 18.6 10*3/uL — ABNORMAL HIGH (ref 4.0–10.5)
nRBC: 19.2 % — ABNORMAL HIGH (ref 0.0–0.2)

## 2018-10-13 LAB — COMPREHENSIVE METABOLIC PANEL
ALT: 32 U/L (ref 0–44)
AST: 59 U/L — ABNORMAL HIGH (ref 15–41)
Albumin: 2.1 g/dL — ABNORMAL LOW (ref 3.5–5.0)
Alkaline Phosphatase: 442 U/L — ABNORMAL HIGH (ref 38–126)
Anion gap: 10 (ref 5–15)
BUN: 31 mg/dL — ABNORMAL HIGH (ref 6–20)
CO2: 23 mmol/L (ref 22–32)
Calcium: 7.6 mg/dL — ABNORMAL LOW (ref 8.9–10.3)
Chloride: 104 mmol/L (ref 98–111)
Creatinine, Ser: 0.96 mg/dL (ref 0.44–1.00)
GFR calc Af Amer: >60 mL/min (ref >60)
GFR calc non Af Amer: >60 mL/min (ref >60)
Glucose, Bld: 187 mg/dL — ABNORMAL HIGH (ref 70–99)
Potassium: 3.4 mmol/L — ABNORMAL LOW (ref 3.5–5.1)
Sodium: 137 mmol/L (ref 135–145)
Total Bilirubin: 3.4 mg/dL — ABNORMAL HIGH (ref 0.3–1.2)
Total Protein: 6 g/dL — ABNORMAL LOW (ref 6.5–8.1)

## 2018-10-13 LAB — GLUCOSE, CAPILLARY
Glucose-Capillary: 151 mg/dL — ABNORMAL HIGH (ref 70–99)
Glucose-Capillary: 154 mg/dL — ABNORMAL HIGH (ref 70–99)
Glucose-Capillary: 197 mg/dL — ABNORMAL HIGH (ref 70–99)
Glucose-Capillary: 161 mg/dL — ABNORMAL HIGH (ref 70–99)
Glucose-Capillary: 244 mg/dL — ABNORMAL HIGH (ref 70–99)
Glucose-Capillary: 221 mg/dL — ABNORMAL HIGH (ref 70–99)

## 2018-10-13 LAB — BLOOD GAS, ARTERIAL
Acid-Base Excess: 2.8 mmol/L — ABNORMAL HIGH (ref 0.0–2.0)
Bicarbonate: 25.9 mmol/L (ref 20.0–28.0)
Drawn by: 308601
FIO2: 40
MECHVT: 420 mL
O2 Saturation: 98.4 %
PEEP: 5 cmH2O
Patient temperature: 36.8
RATE: 30 resp/min
pCO2 arterial: 34.3 mmHg (ref 32.0–48.0)
pH, Arterial: 7.49 — ABNORMAL HIGH (ref 7.350–7.450)
pO2, Arterial: 99.6 mmHg (ref 83.0–108.0)

## 2018-10-13 LAB — TRIGLYCERIDES: Triglycerides: 307 mg/dL — ABNORMAL HIGH (ref <150)

## 2018-10-13 LAB — BPAM PLATELET PHERESIS
Blood Product Expiration Date: 202009062359
ISSUE DATE / TIME: 202009041458
Unit Type and Rh: 5100

## 2018-10-13 LAB — PREPARE PLATELET PHERESIS: Unit division: 0

## 2018-10-13 LAB — FERRITIN: Ferritin: >7500 ng/mL — ABNORMAL HIGH (ref 11–307)

## 2018-10-13 LAB — C-REACTIVE PROTEIN: CRP: 12.3 mg/dL — ABNORMAL HIGH (ref <1.0)

## 2018-10-13 LAB — PHOSPHORUS: Phosphorus: 2.2 mg/dL — ABNORMAL LOW (ref 2.5–4.6)

## 2018-10-13 LAB — LACTATE DEHYDROGENASE: LDH: 411 U/L — ABNORMAL HIGH (ref 98–192)

## 2018-10-13 LAB — FIBRINOGEN: Fibrinogen: 342 mg/dL (ref 210–475)

## 2018-10-13 LAB — MISC LABCORP TEST (SEND OUT): Labcorp test code: 164055

## 2018-10-13 LAB — PREPARE RBC (CROSSMATCH)

## 2018-10-13 LAB — APTT: aPTT: 29 seconds (ref 24–36)

## 2018-10-13 LAB — MAGNESIUM: Magnesium: 2.3 mg/dL (ref 1.7–2.4)

## 2018-10-13 MED ORDER — SODIUM PHOSPHATES 45 MMOLE/15ML IV SOLN
20.0000 mmol | Freq: Once | INTRAVENOUS | Status: AC
  Administered 2018-10-13: 20 mmol via INTRAVENOUS
  Filled 2018-10-13: qty 6.67

## 2018-10-13 MED ORDER — SODIUM CHLORIDE 0.9% IV SOLUTION: Freq: Once | INTRAVENOUS | Status: DC

## 2018-10-13 NOTE — Progress Notes (Signed)
[facility] Progress Note  Subjective: on vent, on CRRT no issues overnight w/ filters   Vitals:   10/13/18 0615 10/13/18 0700 10/13/18 0800 10/13/18 0815  BP: 110/68 105/63 114/75 (!) 91/55  Pulse: 100 100 (!) 103 97  Resp: (!) 30 (!) 30 (!) 30 (!) 30  Temp: 98.2 F (36.8 C) 97.9 F (36.6 C) 98 F (36.7 C) 98.1 F (36.7 C)  TempSrc:   Bladder Bladder  SpO2: 96% 97% 97% 97%  Weight:      Height:        Inpatient medications: . sodium chloride   Intravenous Once  . atovaquone  1,500 mg Oral Daily  . azithromycin  1,200 mg Oral Q Tue  . benzonatate  100 mg Oral TID  . chlorhexidine  15 mL Mouth Rinse BID  . chlorhexidine gluconate (MEDLINE KIT)  15 mL Mouth Rinse BID  . Chlorhexidine Gluconate Cloth  6 each Topical Daily  . dexamethasone  20 mg Intravenous Daily  . emtricitabine-tenofovir AF  1 tablet Per Tube Daily   And  . dolutegravir  50 mg Oral Daily  . feeding supplement (PRO-STAT SUGAR FREE 64)  30 mL Per Tube BID  . guaiFENesin-dextromethorphan  7.5 mL Per Tube Q6H  . insulin aspart  2-6 Units Subcutaneous Q4H  . mouth rinse  15 mL Mouth Rinse 10 times per day  . metoCLOPramide (REGLAN) injection  5 mg Intravenous Once  . mometasone-formoterol  2 puff Inhalation BID  . pantoprazole sodium  40 mg Per Tube Daily  . sodium chloride flush  10-40 mL Intracatheter Q12H   .  prismasol BGK 4/2.5 400 mL/hr at 10/11/18 0216  .  prismasol BGK 4/2.5 200 mL/hr at 10/12/18 1314  . sodium chloride 10 mL/hr at 10/11/18 1737  . sodium chloride    . anticoagulant sodium citrate    . dexmedetomidine (PRECEDEX) IV infusion 0.4 mcg/kg/hr (10/13/18 0800)  . feeding supplement (VITAL HIGH PROTEIN) 1,000 mL (10/13/18 0502)  . fentaNYL infusion INTRAVENOUS 25 mcg/hr (10/13/18 0800)  . norepinephrine (LEVOPHED) Adult infusion Stopped (10/13/18 2440)  . prismasol BGK 4/2.5 1,000 mL/hr at 10/12/18 1817  . vasopressin (PITRESSIN) infusion - *FOR SHOCK* Stopped (10/10/18  1546)   sodium chloride, Place/Maintain arterial line **AND** sodium chloride, acetaminophen (TYLENOL) oral liquid 160 mg/5 mL, albuterol, alteplase, anticoagulant sodium citrate, fentaNYL, midazolam, sodium chloride, sodium chloride flush    Exam: Gen intubated , more alert and interactive L IJ temp hd cath Sclera anicteric, throat w ett  No jvd or bruits Chest coarse rales/ rhonchi bilat  RRR no MRG, tachy Abd soft ntnd no mass or ascites +bs GU foley draining dark brown urine minimal amts MS no joint effusions or deformity Ext 1-2+ bilat LE/UE edema, no wounds or ulcers Neuro is sedated as above    Home meds:  - atovaquone 1.5 gm qd/ azithromycin 1.2 gm weekly  - bictegravir-emtricitabine-tenofovir qd  - doxycyxline 100 bid/ cefdinir 300 bid  - prednisone taper 40 qd > 10 qd     UA 8/25 negative UA  UNa 33, UCreat 94    Renal US 9/2 >  11- 12 cm kidneys w/ ^'d echo and no hydro bilat  Yest/ today - min pressor support - 1.1 L net neg yest  Assessment: 1. AKI - due to shock/ hypoperfusion w/ ATN. CRRT started 10/08/18. Mild -mod edema on exam. Shock improved on low dose pressor x1. Min UOP. Cont CRRT w/ UF as tolerated 50- 100 cc/hr.  No anticoag needed. 2. Shock /  Hypotension - pressors down to low dose levo only 3. Resp failure/ pulm infiltrates - per ID etiology unclear, on empiric IV abx. CXR today improved infiltrates. Weaning vent support actively.  4. Anemia - sp 4u prbcs so far 5. Thrombocytopenia - sp 6u plts so far 6. Recurrent PNA/ resp failure - Per ID  7. AIDS /HIV  Plan - as above.     [facility] 10/13/2018, 8:41 AM  Iron/TIBC/Ferritin/ %Sat    Component Value Date/Time   IRON 15 (L) 09/05/2018 0640   TIBC 146 (L) 09/05/2018 0640   FERRITIN >7,500 (H) 10/13/2018 0343   IRONPCTSAT 10 (L) 09/05/2018 0640   Recent Labs  Lab 10/11/18 1227  10/13/18 0343  NA  --    < > 137  K  --    < > 3.6  CL  --    < > 100   CO2  --    < > 24  GLUCOSE  --    < > 168*  BUN  --    < > 33*  CREATININE  --    < > 0.98  CALCIUM  --    < > 7.5*  PHOS  --    < > 2.2*  2.3*  ALBUMIN 1.9*   < > 1.9*  INR 1.3*  --   --    < > = values in this interval not displayed.   Recent Labs  Lab 10/12/18 0910  AST 57*  ALT 25  ALKPHOS 406*  BILITOT 3.4*  PROT 5.5*   Recent Labs  Lab 10/13/18 0343  WBC 18.6*  HGB 6.6*  HCT 20.1*  PLT 10*

## 2018-10-13 NOTE — Progress Notes (Signed)
eLink Physician-Brief Progress Note Patient Name: Krystal Short DOB: April 18, 1985 MRN: [id number]   Date of Service  10/13/2018  HPI/Events of Note  33 yr old AIDS, anemia, chr low plt AM Hg < 7 and plt at 10. On Vent for resp failure /PNS.  eICU Interventions  - transfuse PRBC and platelet for now and follow levels.      Intervention Category Intermediate Interventions: Diagnostic test evaluation;Thrombocytopenia - evaluation and management  Elmer Sow 10/13/2018, 4:34 AM

## 2018-10-13 NOTE — Progress Notes (Signed)
NAMEJosalyn Short, MRN:  [id number], DOB:  21-Jan-1986, LOS: 7 ADMISSION DATE:  10/05/2018, CONSULTATION DATE:  10/06/2018 REFERRING MD:  Dr. Craig Guess, CHIEF COMPLAINT:  SOB   Brief History   Krystal Short is a 33 yo F with history of HIV/AIDS admitted on 8/29 with progressive shortness of breath. History of multiple admissions over the last year for waxing / waning pulmonary infiltrates. Unfortunately patient decompensated on 8/31 requiring initiation of mechanical intubation and later pressure support and CRRT.    Recent Evaluation  03/08/18-03/10/18 Acute respiratory failure secondary to pneumonia (rhinovirus). tx with rocephin/ zithromax, Bactrim for PCP. Macrocytic anemia   04/22/2018- Flu A infection. Treated with tamiflu as outpatient  05/28/18- 05/31/18 admitted with respiratory failure, lung infiltrates.  Treated with Bactrim, prednisone with improvement  06/14/2018-07/16/2018 Bordetella bronchoseptica infection. Underwent FOB 07/12/2018. PJP test not done as BAL was lost.Biopsy scant lung parenchyma with fibrosis and reactive changes. Required 4 PRBC and 3 platelet infusion.  07/31/2018-08/22/2018 sepsis from suspected PJP pneumonia. Treated with steroids clindamycin and primaquine. Required 8 PRBC and 7 platelet transfusion. RVP positive for routine coronavirus.  09/03/2018-09/13/2018 generalized weakness and deconditioning. Required 3 PRBC 8 platelet transfusion.  7/30/2020IVIGwithout any improvement in platelet count. Given prolonged prednisone taper.  09/11/18 Bone marrowBx all micro studies neg   10/01/2018-10/04/2018 presented with anemia and poor IV access. Given 2 PRBC 1 platelet transfusion. Required pressors. Treated with ceftriaxone and doxy for ? CAP.  Significant Hospital Events   8/29 Admit  8/30 PCCM consulted,started solumedrol 8/30 am @ 80 mg q 12h 8/31 Intubated, FOB 9/1 CRRT  9/4 High dose decadron 20 mg IV qd started by heme due to concern of secondary  [place]. Send soluble CD 25 levels 9/5 Weaning pressors, starting PSV weans, improving CXR  Consults:  PCCM 8/30 ID 8/30 Hem/Onc 8/30 Nephrology 9/1  Procedures:  R IJ CVC 8/30 >> ETT 8/31 >> FOB 8/31 >> L HD cath 9/1 >>  Significant Diagnostic Tests:  Head CT 8/31 >> negative for any acute intracranial process Renal ultrasound 9/2 >> No hydronephrosis, medical renal disease   Chest x-ray 9/6-improving pulmonary infiltrates.  Personally reviewed  Micro Data:  COVID 19 pcr 8/29>>neg BCx 2 8/29 >>no growth 2 days PCP8/31>>neg 9/1 FOB BAL 8/31 >> 50,000 Coag neg staph, PJP negative  Antimicrobials:  Bactrim 8/29 >>8/30 Maxepime 8/29 >> PO Atovaquone 8/30 >> PO Azithromycin 9/1 >>  Interim history/subjective:  On Levophed overnight.  Now off pressors today morning Getting 1 unit PRBC for low hemoglobin Awake on the vent, responsive.  Objective   Blood pressure (!) 91/55, pulse 97, temperature 98.1 F (36.7 C), temperature source Bladder, resp. rate (!) 30, height _0  (1.549 m), weight 58.3 kg, SpO2 97 %.    Vent Mode: PRVC FiO2 (%):  [40 %] 40 % Set Rate:  [30 bmp] 30 bmp Vt Set:  [420 mL] 420 mL PEEP:  [5 cmH20] 5 cmH20 Pressure Support:  [10 cmH20] 10 cmH20 Plateau Pressure:  [21 cmH20-26 cmH20] 25 cmH20   Intake/Output Summary (Last 24 hours) at 10/13/2018 0839 Last data filed at 10/13/2018 0815 Gross per 24 hour  Intake 2824.63 ml  Output 3791 ml  Net -966.37 ml   Filed Weights   10/11/18 0452 10/12/18 0500 10/13/18 0500  Weight: 57.7 kg 55.8 kg 58.3 kg    Examination: Gen:      No acute distress HEENT:  EOMI, sclera anicteric, ET tube Neck:     No masses; no thyromegaly  Lungs:    Clear to auscultation bilaterally; normal respiratory effort CV:         Regular rate and rhythm; no murmurs Abd:      + bowel sounds; soft, non-tender; no palpable masses, no distension Ext:    1+ edema; adequate peripheral perfusion Skin:      Warm and dry; no  rash Neuro: Awake, responsive.  No focal deficits.  Resolved Hospital Problem list     Assessment & Plan:  AcuteRecurrentHypoxemic Respiratory Failure -In the setting of advanced HIV/AIDS now with Staph Epid seen on BAL with bloody secretion P: Continue pressure support weans. Chest x-ray is improving.  We are hopeful of extubation within the next 24 to 48 hours  RecurrentBilateralPulmonaryInfiltrates -Staph Epid seen on BAL, PJP neg. BC with no growth at 4 days  P: ABX per ID   Acute encephalopathy > Improved -In the setting of advanced HIV/AIDS, respiratory and renal failure, and profound thrombocytopenia. Head CT neg for any acute abnormalities  P: Wean down sedation  ProfoundPersistentThrombocytopenia, anemia -Extensive workup completed by Hemo/Onc with BM BX suggesting normal bone marrow. HIT ab negative, no MAHA. Currently on steroids, initially started for possible sarcoid but no granulomas on prior biospy P: Appreciate input from Dr. Irene Limbo, Hematology Still unclear as to why she has had recurrent admissions with lung infiltrates, organ failure, pancytopenia The most likely explanation is this is secondary to HIV infection, inflammatory state. Possibly could be secondary [place] from HIV or viral infections (noted to have flu infection, coronavirus infection earlier this year).  She has been ruled out several times for SARS-CoV-2 by PCR. We will check SARS-CoV-2 IgG She does meet several of the criteria is for [place] including hepatosplenomegaly, cytopenias, elevated LDH, triglycerides, elevated LFTs.  Oncology note from July notes hemophagocytosis in bone marrow Awaiting soluble IL2Ra levels Continue increased dose of Decadron.  Consider etoposide If no improvement then consider transfer to a tertiary facility. Will discuss with heme next week. Discussed with family daily  AKI AGMA Hyperkalemia  Hypocalcemia  Likelyrelated to volume depletion vs drug side effects,  no evidence of a pulmonary/renal syndrome. Currently on CRRT 2.8L out with 310 urine output seen overnight. FENa 0.5%, renal ultrasound neg P: Continue CRRT Volume removal as able.  She still appears volume overloaded on examination. Suspect some of the pulmonary infiltrates may be due to pulmonary edema  CirculatoryShock -likely some component of sedation, +/- sepsis  P: Weaned off pressors.  HIV  HIV viral load undetectable but CD4 count remains low   P: Antibiotics and antiretroviral medication per ID.  Best practice:  Diet:TF via OG tube Pain/Anxiety/Delirium protocol (if indicated):Fentanyl dripand Precedex VAP protocol (if indicated):In place DVT prophylaxis:SCD's, allergy to heparin GI prophylaxis:PPI Glucose control:N/A Mobility:BR Code Status:Full Family Communication:Both husband and mom updated daily at bedside and continue to express desire for aggressive measures    Disposition:ICU  Labs   CBC: Recent Labs  Lab 10/09/18 0417 10/10/18 0424 10/10/18 1701 10/11/18 0455 10/11/18 1227 10/12/18 0407 10/13/18 0343  WBC 13.0* 14.6*  --  15.0*  --  17.8* 18.6*  NEUTROABS  --   --   --  12.6*  --  13.1*  --   HGB 8.0* 7.0* 8.5* 7.9*  --  7.6* 6.6*  HCT 23.3* 21.0* 25.3* 23.9*  --  23.3* 20.1*  MCV 87.9 90.1  --  90.5  --  92.1 93.5  PLT 7* 5*  --  7* 6* 11* 10*    Basic  Metabolic Panel: Recent Labs  Lab 10/09/18 0417  10/10/18 0424  10/11/18 0455 10/11/18 1840 10/12/18 0407 10/12/18 0910 10/12/18 1600 10/13/18 0343  NA 139   < > 137   < > 139 138 137 138 137 137  K 4.8   < > 4.5   < > 3.5 3.2* 3.4* 3.4* 4.1 3.6  CL 105   < > 103   < > 105 104 102 102 106 100  CO2 23   < > 26   < > _0 GLUCOSE 138*   < > 120*   < > 207* 209* 191* 179* 223* 168*  BUN 47*   < > 24*   < > 27* 27* 27* 27* 30* 33*  CREATININE 1.35*   < > 0.97   < > 0.94 0.91 0.90 0.90 0.87 0.98  CALCIUM 6.7*   < > 7.2*   < > 7.1* 7.3* 7.5* 7.5* 7.1* 7.5*  MG  2.4  --  2.6*  --  2.4  --  2.5*  --   --  2.3  PHOS 5.7*   < > 3.5  3.6   < > 1.7* 1.3* 1.3*  --  1.0* 2.2*  2.3*   < > = values in this interval not displayed.   GFR: Estimated Creatinine Clearance: 68.3 mL/min (by C-G formula based on SCr of 0.98 mg/dL). Recent Labs  Lab 10/08/18 0105  10/10/18 0424 10/11/18 0455 10/12/18 0407 10/13/18 0343  WBC  --    < > 14.6* 15.0* 17.8* 18.6*  LATICACIDVEN 1.2  --   --   --   --   --    < > = values in this interval not displayed.    Liver Function Tests: Recent Labs  Lab 10/07/18 1209 10/08/18 0956 10/08/18 1053  10/11/18 1227 10/11/18 1840 10/12/18 0407 10/12/18 0910 10/12/18 1600 10/13/18 0343  AST 119* 110* 129*  --  56*  --   --  57*  --   --   ALT _1 --  21  --   --  25  --   --   ALKPHOS 187* 254* 291*  --  374*  --   --  406*  --   --   BILITOT 3.5* 3.7* 4.4*  --  3.8*  --   --  3.4*  --   --   PROT 6.0* 4.4* 5.1*  --  5.7*  --   --  5.5*  --   --   ALBUMIN 2.0* 1.4* 1.5*   < > 1.9* 1.9* 1.8* 1.8* 1.8* 1.9*   < > = values in this interval not displayed.   No results for input(s): LIPASE, AMYLASE in the last 168 hours. No results for input(s): AMMONIA in the last 168 hours.  ABG    Component Value Date/Time   PHART 7.490 (H) 10/13/2018 0430   PCO2ART 34.3 10/13/2018 0430   PO2ART 99.6 10/13/2018 0430   HCO3 25.9 10/13/2018 0430   TCO2 15 (L) 10/06/2018 0300   ACIDBASEDEF 10.9 (H) 10/08/2018 1139   O2SAT 98.4 10/13/2018 0430     Coagulation Profile: Recent Labs  Lab 10/07/18 1800 10/11/18 1227  INR 1.9* 1.3*    Cardiac Enzymes: No results for input(s): CKTOTAL, CKMB, CKMBINDEX, TROPONINI in the last 168 hours.  HbA1C: Hgb A1c MFr Bld  Date/Time Value Ref Range Status  08/09/2018 02:28 AM 5.5 4.8 - 5.6 % Final  Comment:    (NOTE) Pre diabetes:          5.7%-6.4% Diabetes:              >6.4% Glycemic control for   <7.0% adults with diabetes   08/02/2018 06:46 PM 4.7 (L) 4.8 - 5.6 % Final     Comment:    (NOTE) Pre diabetes:          5.7%-6.4% Diabetes:              >6.4% Glycemic control for   <7.0% adults with diabetes     CBG: Recent Labs  Lab 10/12/18 1124 10/12/18 1619 10/12/18 1935 10/13/18 0418 10/13/18 0746  GLUCAP 172* 209* 201* 151* 154*     Critical care time: 70    The patient is critically ill with multiple organ system failure and requires high complexity decision making for assessment and support, frequent evaluation and titration of therapies, advanced monitoring, review of radiographic studies and interpretation of complex data.   Critical Care Time devoted to patient care services, exclusive of separately billable procedures, described in this note is 45 minutes.   Marshell Garfinkel MD [facility] Pager [phone] If no answer call [phone] 10/13/2018, 8:39 AM

## 2018-10-14 ENCOUNTER — Inpatient Hospital Stay (HOSPITAL_COMMUNITY): Payer: 59

## 2018-10-14 DIAGNOSIS — M7989 Other specified soft tissue disorders: Secondary | ICD-10-CM

## 2018-10-14 LAB — TYPE AND SCREEN
ABO/RH(D): O POS
Antibody Screen: NEGATIVE
Unit division: 0
Unit division: 0

## 2018-10-14 LAB — APTT: aPTT: 30 seconds (ref 24–36)

## 2018-10-14 LAB — BPAM RBC
Blood Product Expiration Date: 202010042359
Blood Product Expiration Date: 202010062359
ISSUE DATE / TIME: 202009031154
ISSUE DATE / TIME: 202009060543
Unit Type and Rh: 5100
Unit Type and Rh: 5100

## 2018-10-14 LAB — RENAL FUNCTION PANEL
Albumin: 2.1 g/dL — ABNORMAL LOW (ref 3.5–5.0)
Albumin: 2.1 g/dL — ABNORMAL LOW (ref 3.5–5.0)
Anion gap: 10 (ref 5–15)
Anion gap: 11 (ref 5–15)
BUN: 35 mg/dL — ABNORMAL HIGH (ref 6–20)
BUN: 35 mg/dL — ABNORMAL HIGH (ref 6–20)
CO2: 25 mmol/L (ref 22–32)
CO2: 25 mmol/L (ref 22–32)
Calcium: 7.6 mg/dL — ABNORMAL LOW (ref 8.9–10.3)
Calcium: 7.4 mg/dL — ABNORMAL LOW (ref 8.9–10.3)
Chloride: 101 mmol/L (ref 98–111)
Chloride: 100 mmol/L (ref 98–111)
Creatinine, Ser: 0.95 mg/dL (ref 0.44–1.00)
Creatinine, Ser: 1 mg/dL (ref 0.44–1.00)
GFR calc Af Amer: >60 mL/min (ref >60)
GFR calc Af Amer: >60 mL/min (ref >60)
GFR calc non Af Amer: >60 mL/min (ref >60)
GFR calc non Af Amer: >60 mL/min (ref >60)
Glucose, Bld: 162 mg/dL — ABNORMAL HIGH (ref 70–99)
Glucose, Bld: 282 mg/dL — ABNORMAL HIGH (ref 70–99)
Phosphorus: 2.4 mg/dL — ABNORMAL LOW (ref 2.5–4.6)
Phosphorus: 9.6 mg/dL — ABNORMAL HIGH (ref 2.5–4.6)
Potassium: 3.7 mmol/L (ref 3.5–5.1)
Potassium: 4.2 mmol/L (ref 3.5–5.1)
Sodium: 136 mmol/L (ref 135–145)
Sodium: 136 mmol/L (ref 135–145)

## 2018-10-14 LAB — BLOOD GAS, ARTERIAL
Acid-Base Excess: 2.8 mmol/L — ABNORMAL HIGH (ref 0.0–2.0)
Bicarbonate: 25.9 mmol/L (ref 20.0–28.0)
Delivery systems: POSITIVE
Drawn by: 308601
Expiratory PAP: 8
FIO2: 100
Inspiratory PAP: 14
Mode: POSITIVE
O2 Saturation: 100 %
Patient temperature: 36.9
pCO2 arterial: 35.2 mmHg (ref 32.0–48.0)
pH, Arterial: 7.48 — ABNORMAL HIGH (ref 7.350–7.450)
pO2, Arterial: 467 mmHg — ABNORMAL HIGH (ref 83.0–108.0)

## 2018-10-14 LAB — GLUCOSE, CAPILLARY
Glucose-Capillary: 118 mg/dL — ABNORMAL HIGH (ref 70–99)
Glucose-Capillary: 122 mg/dL — ABNORMAL HIGH (ref 70–99)
Glucose-Capillary: 133 mg/dL — ABNORMAL HIGH (ref 70–99)
Glucose-Capillary: 151 mg/dL — ABNORMAL HIGH (ref 70–99)
Glucose-Capillary: 157 mg/dL — ABNORMAL HIGH (ref 70–99)
Glucose-Capillary: 186 mg/dL — ABNORMAL HIGH (ref 70–99)

## 2018-10-14 LAB — CBC
HCT: 26.4 % — ABNORMAL LOW (ref 36.0–46.0)
Hemoglobin: 8.8 g/dL — ABNORMAL LOW (ref 12.0–15.0)
MCH: 30.6 pg (ref 26.0–34.0)
MCHC: 33.3 g/dL (ref 30.0–36.0)
MCV: 91.7 fL (ref 80.0–100.0)
Platelets: 13 10*3/uL — CL (ref 150–400)
RBC: 2.88 MIL/uL — ABNORMAL LOW (ref 3.87–5.11)
RDW: 16.6 % — ABNORMAL HIGH (ref 11.5–15.5)
WBC: 21 10*3/uL — ABNORMAL HIGH (ref 4.0–10.5)
nRBC: 13.5 % — ABNORMAL HIGH (ref 0.0–0.2)

## 2018-10-14 LAB — MAGNESIUM: Magnesium: 2.4 mg/dL (ref 1.7–2.4)

## 2018-10-14 LAB — LACTATE DEHYDROGENASE: LDH: 382 U/L — ABNORMAL HIGH (ref 98–192)

## 2018-10-14 LAB — FERRITIN: Ferritin: >7500 ng/mL — ABNORMAL HIGH (ref 11–307)

## 2018-10-14 LAB — PHOSPHORUS: Phosphorus: 2.5 mg/dL (ref 2.5–4.6)

## 2018-10-14 LAB — TRIGLYCERIDES: Triglycerides: 286 mg/dL — ABNORMAL HIGH (ref <150)

## 2018-10-14 MED ORDER — SODIUM PHOSPHATES 45 MMOLE/15ML IV SOLN
20.0000 mmol | Freq: Once | INTRAVENOUS | Status: AC
  Administered 2018-10-14: 20 mmol via INTRAVENOUS
  Filled 2018-10-14: qty 6.67

## 2018-10-14 MED ORDER — LIDOCAINE 5 % EX PTCH
1.0000 | MEDICATED_PATCH | CUTANEOUS | Status: DC
  Administered 2018-10-14 – 2018-10-23 (×4): 1 via TRANSDERMAL
  Filled 2018-10-14 (×10): qty 1

## 2018-10-14 MED ORDER — ALBUTEROL SULFATE (2.5 MG/3ML) 0.083% IN NEBU
2.5000 mg | INHALATION_SOLUTION | RESPIRATORY_TRACT | Status: DC | PRN
  Administered 2018-10-14: 22:00:00 2.5 mg via RESPIRATORY_TRACT

## 2018-10-14 NOTE — Procedures (Signed)
Extubation Procedure Note  Patient Details:   Name: Krystal Short DOB: 1985-09-24 MRN: [id number]   Airway Documentation:  Airway 7.5 mm (Active)  Secured at (cm) 21 cm 10/14/18 0804  Measured From Lips 10/14/18 [place] 10/14/18 0804  Secured By [employer] 10/14/18 0804  Tube Holder Repositioned Yes 10/14/18 0804  Cuff Pressure (cm H2O) 24 cm H2O 10/14/18 0804  Site Condition Dry 10/14/18 0804   Vent end date: 10/14/18 Vent end time: 1015   Evaluation  O2 sats: stable throughout Complications: No apparent complications Patient did tolerate procedure well. Bilateral Breath Sounds: Diminished   Yes    Extubated to 4 L [place]; no stridor noted. RT will continue to monitor patient.   Lamonte Sakai 10/14/2018, 10:20 AM

## 2018-10-14 NOTE — Progress Notes (Addendum)
Patient ID: Krystal Short, female   DOB: Apr 13, 1985, 33 y.o.   MRN: [id number] [facility] Progress Note   Assessment/ Plan:   1. Acute kidney Injury: Suspected to be secondary to ATN from septic shock.  On CRRT and without any evident renal recovery yet.  Hemodynamic status improving and likely will permit transition to IHD if hemodialysis need is prolonged. 2.  Acute/recurrent hypoxemic respiratory failure: With bronchoalveolar lavage showing Staphylococcus epidermidis for which he is on antimicrobial coverage.  Testing for PGP negative. 3.  Thrombocytopenia/anemia: Ongoing work-up including bone marrow biopsy recently undertaken.  Additional management per hematology.  Anemia likely exacerbated by acute/critical illness. 4.  Hyperphosphatemia: Secondary to CRRT losses/GI losses, will replace intravenously  Subjective:   No acute events noted overnight, tolerating CRRT without problems.  Urine output 92 cc overnight.   Objective:   BP 109/75   Pulse (!) 102   Temp 98.8 F (37.1 C)   Resp (!) 24   Ht _0  (1.549 m)   Wt 51.8 kg   LMP  (LMP Unknown) Comment: pt is on depo  SpO2 98%   BMI 21.58 kg/m   Intake/Output Summary (Last 24 hours) at 10/14/2018 1231 Last data filed at 10/14/2018 1200 Gross per 24 hour  Intake 1581.59 ml  Output 3398 ml  Net -1816.41 ml   Weight change: -6.5 kg  Physical Exam: Gen: Comfortably comfortably resting in bed, on CRRT, husband at bedside-updated.  Molluscum rash noted on face. CVS: Pulse regular tachycardia, S1 and S2 with ejection systolic murmur Resp: Coarse breath sounds bilaterally without distinct rales or rhonchi Abd: Soft, flat, nontender Ext: Trace-1+ edema over both legs  Imaging: Dg Chest Port 1 View  Result Date: 10/14/2018 CLINICAL DATA:  resp failure EXAM: PORTABLE CHEST 1 VIEW COMPARISON:  10/13/2018 FINDINGS: Endotracheal tube is in place, tip 4.8 centimeters above the carina. RIGHT IJ central line tip overlies  the superior vena cava. LEFT IJ central line tip overlies the superior vena cava. Patient is rotated towards the LEFT. Nasogastric tube tip is off the image. The heart is normal in size. Diffuse ground-glass opacities are identified throughout the lungs bilaterally without significant change. No new consolidation. IMPRESSION: Stable bilateral pulmonary infiltrates. Electronically Signed   By: Nolon Nations M.D.   On: 10/14/2018 09:03   Dg Chest Port 1 View  Result Date: 10/13/2018 CLINICAL DATA:  Acute kidney injury secondary to shock and hypoperfusion. Intubated patient. EXAM: PORTABLE CHEST 1 VIEW COMPARISON:  Single-view of the chest 10/12/2018 and 10/10/2018. FINDINGS: Support tubes and lines are unchanged and project in good position. Diffuse hazy bilateral airspace disease has improved. No pneumothorax or pleural fluid. Heart size is normal. IMPRESSION: No change in support apparatus. Improved bilateral airspace disease. Electronically Signed   By: Inge Rise M.D.   On: 10/13/2018 08:33   Vas [place] Upper Extremity Venous Duplex  Result Date: 10/14/2018 UPPER VENOUS STUDY  Indications: Edema Limitations: Bandages and Edema and size of the veins. Comparison Study: No previous study available Performing Technologist: Toma Copier RVS  Examination Guidelines: A complete evaluation includes B-mode imaging, spectral Doppler, color Doppler, and power Doppler as needed of all accessible portions of each vessel. Bilateral testing is considered an integral part of a complete examination. Limited examinations for reoccurring indications may be performed as noted.  Right Findings: +----------+------------+---------+-----------+----------+---------------------+ RIGHT     CompressiblePhasicitySpontaneousProperties       Summary        +----------+------------+---------+-----------+----------+---------------------+ IJV  Not visualized due to                                                         dialysis access    +----------+------------+---------+-----------+----------+---------------------+ Subclavian    Full       Yes       Yes                                    +----------+------------+---------+-----------+----------+---------------------+ Axillary      Full       Yes       Yes                                    +----------+------------+---------+-----------+----------+---------------------+ Brachial      Full       Yes       Yes                                    +----------+------------+---------+-----------+----------+---------------------+ Radial        Full                                                        +----------+------------+---------+-----------+----------+---------------------+ Ulnar         Full                                                        +----------+------------+---------+-----------+----------+---------------------+ Cephalic                                            Not visualized due to                                                     edema and size of the                                                             vein          +----------+------------+---------+-----------+----------+---------------------+ Basilic                                             Not visualized due to  the size of the vein  +----------+------------+---------+-----------+----------+---------------------+ Technically limited due to dialysis access in the neck, size and edema.  Left Findings: +----------+------------+---------+-----------+----------+---------------------+ LEFT      CompressiblePhasicitySpontaneousProperties       Summary        +----------+------------+---------+-----------+----------+---------------------+ IJV                                                 Not visualized due to                                                          IV placement      +----------+------------+---------+-----------+----------+---------------------+ Subclavian                                          Not visualized due to                                                        bandages for IV                                                              placement       +----------+------------+---------+-----------+----------+---------------------+ Axillary      Full       Yes       Yes                                    +----------+------------+---------+-----------+----------+---------------------+ Brachial      Full       Yes       Yes                                    +----------+------------+---------+-----------+----------+---------------------+ Radial        Full                                                        +----------+------------+---------+-----------+----------+---------------------+ Ulnar         Full                                                        +----------+------------+---------+-----------+----------+---------------------+ Cephalic  Not visualize due to                                                         size and edema     +----------+------------+---------+-----------+----------+---------------------+ Basilic                                             Not visualized due to                                                        size and edema     +----------+------------+---------+-----------+----------+---------------------+ Technically limited due to bandages, IV placement, size. and edema  Summary:  Right: No evidence of deep vein thrombosis in the upper extremity. However, unable to visualize all veins. See comments listed above. No evidence of superficial vein thrombosis . However, unable to visualize See comments listed above. No evidence of  thrombosis in the subclavian. This was a limited study.  Left: No evidence of deep vein thrombosis in the upper extremity. However, unable to visualize all veins. See comments listed avone. No evidence of superficial vein thrombosis . However, unable to visualize all veins. See comments listed avone. Unable to visualize the subclavian/ See comments listed abone. This was a limited study.  *See table(s) above for measurements and observations.     Preliminary     Labs: BMET Recent Labs  Lab 10/11/18 0455 10/11/18 1840 10/12/18 0407 10/12/18 0910 10/12/18 1600 10/13/18 0343 10/13/18 0939 10/13/18 1716 10/14/18 0404  NA 139 138 137 138 137 137 137 136 136  K 3.5 3.2* 3.4* 3.4* 4.1 3.6 3.4* 4.2 3.7  CL 105 104 102 102 106 100 104 102 101  CO_0  GLUCOSE 207* 209* 191* 179* 223* 168* 187* 270* 162*  BUN 27* 27* 27* 27* 30* 33* 31* 37* 35*  CREATININE 0.94 0.91 0.90 0.90 0.87 0.98 0.96 0.99 0.95  CALCIUM 7.1* 7.3* 7.5* 7.5* 7.1* 7.5* 7.6* 7.1* 7.6*  PHOS 1.7* 1.3* 1.3*  --  1.0* 2.2*  2.3*  --  3.6 2.4*  2.5   CBC Recent Labs  Lab 10/11/18 0455 10/11/18 1227 10/12/18 0407 10/13/18 0343 10/14/18 0404  WBC 15.0*  --  17.8* 18.6* 21.0*  NEUTROABS 12.6*  --  13.1*  --   --   HGB 7.9*  --  7.6* 6.6* 8.8*  HCT 23.9*  --  23.3* 20.1* 26.4*  MCV 90.5  --  92.1 93.5 91.7  PLT 7* 6* 11* 10* 13*    Medications:    . atovaquone  1,500 mg Oral Daily  . azithromycin  1,200 mg Oral Q Tue  . benzonatate  100 mg Oral TID  . chlorhexidine  15 mL Mouth Rinse BID  . chlorhexidine gluconate (MEDLINE KIT)  15 mL Mouth Rinse BID  . Chlorhexidine Gluconate Cloth  6 each Topical Daily  . emtricitabine-tenofovir AF  1 tablet Per Tube Daily   And  . dolutegravir  50 mg Oral Daily  .  feeding supplement (PRO-STAT SUGAR FREE 64)  30 mL Per Tube BID  . guaiFENesin-dextromethorphan  7.5 mL Per Tube Q6H  . insulin aspart  2-6 Units Subcutaneous Q4H  . mouth rinse  15 mL  Mouth Rinse 10 times per day  . mometasone-formoterol  2 puff Inhalation BID  . pantoprazole sodium  40 mg Per Tube Daily  . sodium chloride flush  10-40 mL Intracatheter Q12H   Elmarie Shiley, MD 10/14/2018, 12:31 PM

## 2018-10-14 NOTE — Progress Notes (Addendum)
NAME:  Krystal Short, MRN:  [id number], DOB:  September 16, 1985, LOS: 8 ADMISSION DATE:  10/05/2018, CONSULTATION DATE:  10/06/2018 REFERRING MD:  Dr. Craig Guess, CHIEF COMPLAINT:  SOB   Brief History   Krystal Short is a 33 yo F with history of HIV/AIDS admitted on 8/29 with progressive shortness of breath. History of multiple admissions over the last year for waxing / waning pulmonary infiltrates. Unfortunately patient decompensated on 8/31 requiring initiation of mechanical intubation and later pressure support and CRRT.    Recent Evaluation  03/08/18-03/10/18 Acute respiratory failure secondary to pneumonia (rhinovirus). tx with rocephin/ zithromax, Bactrim for PCP. Macrocytic anemia   04/22/2018- Flu A infection. Treated with tamiflu as outpatient  05/28/18- 05/31/18 admitted with respiratory failure, lung infiltrates.  Treated with Bactrim, prednisone with improvement  06/14/2018-07/16/2018 Bordetella bronchoseptica infection. Underwent FOB 07/12/2018. PJP test not done as BAL was lost.Biopsy scant lung parenchyma with fibrosis and reactive changes. Required 4 PRBC and 3 platelet infusion.  07/31/2018-08/22/2018 sepsis from suspected PJP pneumonia. Treated with steroids clindamycin and primaquine. Required 8 PRBC and 7 platelet transfusion. RVP positive for routine coronavirus.  09/03/2018-09/13/2018 generalized weakness and deconditioning. Required 3 PRBC 8 platelet transfusion.  7/30/2020IVIGwithout any improvement in platelet count. Given prolonged prednisone taper.  09/11/18 Bone marrowBx all micro studies neg   10/01/2018-10/04/2018 presented with anemia and poor IV access. Given 2 PRBC 1 platelet transfusion. Required pressors. Treated with ceftriaxone and doxy for ? CAP.  Significant Hospital Events   8/29 Admit  8/30 PCCM consulted,started solumedrol 8/30 am @ 80 mg q 12h 8/31 Intubated, FOB 9/1 CRRT  9/4 High dose decadron 20 mg IV qd started by heme due to concern of secondary  [place]. Send soluble CD 25/ IL2Ra levels 9/5 Weaning pressors, starting PSV weans, improving CXR  Consults:  PCCM 8/30 ID 8/30 Hem/Onc 8/30 Nephrology 9/1  Procedures:  R IJ CVC 8/30 >> ETT 8/31 >> FOB 8/31 >> L HD cath 9/1 >>  Significant Diagnostic Tests:  Head CT 8/31 >> negative for any acute intracranial process Renal ultrasound 9/2 >> No hydronephrosis, medical renal disease   Chest x-ray 9/6-improving pulmonary infiltrates.  Personally reviewed  Micro Data:  COVID 19 pcr 8/29>>neg SARS COV2 IgG 9/5- neg  BCx 2 8/29 >>no growth 2 days PCP8/31>>neg 9/1 FOB BAL 8/31 >> 50,000 Coag neg staph, PJP negative  Antimicrobials:  Bactrim 8/29 >>8/30 Maxepime 8/29 >> 9/5 PO Atovaquone 8/30 >> PO Azithromycin 9/1 >>  Interim history/subjective:  Awake on the vent.  No acute events Weaning on pressure support 8/5 yesterday  Objective   Blood pressure 108/65, pulse (!) 106, temperature 97.9 F (36.6 C), resp. rate (!) 30, height _0  (1.549 m), weight 51.8 kg, SpO2 98 %.    Vent Mode: PRVC FiO2 (%):  [40 %] 40 % Set Rate:  [30 bmp] 30 bmp Vt Set:  [420 mL] 420 mL PEEP:  [5 cmH20] 5 cmH20 Pressure Support:  [8 cmH20] 8 cmH20 Plateau Pressure:  [23 cmH20-26 cmH20] 26 cmH20   Intake/Output Summary (Last 24 hours) at 10/14/2018 0824 Last data filed at 10/14/2018 0800 Gross per 24 hour  Intake 1780.34 ml  Output 3734 ml  Net -1953.66 ml   Filed Weights   10/12/18 0500 10/13/18 0500 10/14/18 0402  Weight: 55.8 kg 58.3 kg 51.8 kg    Examination: Gen:      No acute distress HEENT:  EOMI, sclera anicteric, ETT Neck:     No masses; no thyromegaly Lungs:  Clear to auscultation bilaterally; normal respiratory effort CV:         Regular rate and rhythm; no murmurs Abd:      + bowel sounds; soft, non-tender; no palpable masses, no distension Ext:    1+ edema, upper extremity edema left greater than right Skin:      Warm and dry; no rash Neuro: Awake,  responsive  Resolved Hospital Problem list     Assessment & Plan:  AcuteRecurrentHypoxemic Respiratory Failure -In the setting of advanced HIV/AIDS now with Staph Epid seen on BAL with bloody secretion P: Continue pressure support weans. Hopeful for extubation today Chest x ray is improving.   RecurrentBilateralPulmonaryInfiltrates -Staph Epid seen on BAL, PJP neg. BC with no growth P: ABX per ID   Acute encephalopathy > Improved -In the setting of advanced HIV/AIDS, respiratory and renal failure, and profound thrombocytopenia. Head CT neg for any acute abnormalities  P: Wean down sedation  ProfoundPersistentThrombocytopenia, anemia -Extensive workup completed by Hemo/Onc with BM BX suggesting normal bone marrow. HIT ab negative, no MAHA. Currently on steroids, initially started for possible sarcoid but no granulomas on prior biospy P: Appreciate input from Dr. Irene Limbo, Hematology Still unclear as to why she has had recurrent admissions with lung infiltrates, organ failure, pancytopenia The most likely explanation is this is secondary to HIV infection, inflammatory state. Possibly could be secondary [place] from HIV or viral infections (noted to have flu infection, coronavirus infection earlier this year).  She has been ruled out several times for SARS-CoV-2 by PCR and SARS-CoV-2 IgG is negative She does meet several of the criteria is for [place] including hepatosplenomegaly, cytopenias, elevated LDH, triglycerides, elevated LFTs.  Oncology note from July notes hemophagocytosis in bone marrow Awaiting soluble IL2Ra (CD 25) levels Plts have stabilized and she has not required daily platelet transfusions since high dose decadron was initiated on friday Will discuss with heme tomorrow about next steps. Etoposide vs transfer to tertiary facility  AKI AGMA Hyperkalemia  Hypocalcemia  Likelyrelated to volume depletion vs drug side effects, no evidence of a pulmonary/renal syndrome.  Currently on CRRT 2.8L out with 310 urine output seen overnight. FENa 0.5%, renal ultrasound neg P: Continue CRRT Volume removal as able.  She still appears volume overloaded on examination. Suspect some of the pulmonary infiltrates may be due to pulmonary edema  CirculatoryShock -likely some component of sedation, +/- sepsis  P: Weaned off pressors.  HIV  HIV viral load undetectable but CD4 count remains low   P: Antibiotics and antiretroviral medication per ID.  Upper extremity edema left greater than right P: Check duplex US to evaluate for DVT.  Best practice:  Diet:TF via OG tube Pain/Anxiety/Delirium protocol (if indicated):Fentanyl dripand Precedex VAP protocol (if indicated):In place DVT prophylaxis:SCD's, allergy to heparin GI prophylaxis:PPI Glucose control:N/A Mobility:BR Code Status:Full Family Communication:Both husband and mom updated daily at bedside and continue to express desire for aggressive measures    Disposition:ICU  Labs   CBC: Recent Labs  Lab 10/10/18 0424 10/10/18 1701 10/11/18 0455 10/11/18 1227 10/12/18 0407 10/13/18 0343 10/14/18 0404  WBC 14.6*  --  15.0*  --  17.8* 18.6* 21.0*  NEUTROABS  --   --  12.6*  --  13.1*  --   --   HGB 7.0* 8.5* 7.9*  --  7.6* 6.6* 8.8*  HCT 21.0* 25.3* 23.9*  --  23.3* 20.1* 26.4*  MCV 90.1  --  90.5  --  92.1 93.5 91.7  PLT 5*  --  7*  6* 11* 10* 13*    Basic Metabolic Panel: Recent Labs  Lab 10/10/18 0424  10/11/18 0455  10/12/18 0407  10/12/18 1600 10/13/18 0343 10/13/18 0939 10/13/18 1716 10/14/18 0404  NA 137   < > 139   < > 137   < > 137 137 137 136 136  K 4.5   < > 3.5   < > 3.4*   < > 4.1 3.6 3.4* 4.2 3.7  CL 103   < > 105   < > 102   < > 106 100 104 102 101  CO2 26   < > 25   < > 25   < > _0 GLUCOSE 120*   < > 207*   < > 191*   < > 223* 168* 187* 270* 162*  BUN 24*   < > 27*   < > 27*   < > 30* 33* 31* 37* 35*  CREATININE 0.97   < > 0.94   < > 0.90   < >  0.87 0.98 0.96 0.99 0.95  CALCIUM 7.2*   < > 7.1*   < > 7.5*   < > 7.1* 7.5* 7.6* 7.1* 7.6*  MG 2.6*  --  2.4  --  2.5*  --   --  2.3  --   --  2.4  PHOS 3.5  3.6   < > 1.7*   < > 1.3*  --  1.0* 2.2*  2.3*  --  3.6 2.4*  2.5   < > = values in this interval not displayed.   GFR: Estimated Creatinine Clearance: 64.7 mL/min (by C-G formula based on SCr of 0.95 mg/dL). Recent Labs  Lab 10/08/18 0105  10/11/18 0455 10/12/18 0407 10/13/18 0343 10/14/18 0404  WBC  --    < > 15.0* 17.8* 18.6* 21.0*  LATICACIDVEN 1.2  --   --   --   --   --    < > = values in this interval not displayed.    Liver Function Tests: Recent Labs  Lab 10/08/18 0956 10/08/18 1053  10/11/18 1227  10/12/18 0910 10/12/18 1600 10/13/18 0343 10/13/18 0939 10/13/18 1716 10/14/18 0404  AST 110* 129*  --  56*  --  57*  --   --  59*  --   --   ALT 20 24  --  21  --  25  --   --  32  --   --   ALKPHOS 254* 291*  --  374*  --  406*  --   --  442*  --   --   BILITOT 3.7* 4.4*  --  3.8*  --  3.4*  --   --  3.4*  --   --   PROT 4.4* 5.1*  --  5.7*  --  5.5*  --   --  6.0*  --   --   ALBUMIN 1.4* 1.5*   < > 1.9*   < > 1.8* 1.8* 1.9* 2.1* 2.0* 2.1*   < > = values in this interval not displayed.   No results for input(s): LIPASE, AMYLASE in the last 168 hours. No results for input(s): AMMONIA in the last 168 hours.  ABG    Component Value Date/Time   PHART 7.490 (H) 10/13/2018 0430   PCO2ART 34.3 10/13/2018 0430   PO2ART 99.6 10/13/2018 0430   HCO3 25.9 10/13/2018 0430   TCO2 15 (L) 10/06/2018 0300   ACIDBASEDEF  10.9 (H) 10/08/2018 1139   O2SAT 98.4 10/13/2018 0430     Coagulation Profile: Recent Labs  Lab 10/07/18 1800 10/11/18 1227  INR 1.9* 1.3*    Cardiac Enzymes: No results for input(s): CKTOTAL, CKMB, CKMBINDEX, TROPONINI in the last 168 hours.  HbA1C: Hgb A1c MFr Bld  Date/Time Value Ref Range Status  08/09/2018 02:28 AM 5.5 4.8 - 5.6 % Final    Comment:    (NOTE) Pre diabetes:           5.7%-6.4% Diabetes:              >6.4% Glycemic control for   <7.0% adults with diabetes   08/02/2018 06:46 PM 4.7 (L) 4.8 - 5.6 % Final    Comment:    (NOTE) Pre diabetes:          5.7%-6.4% Diabetes:              >6.4% Glycemic control for   <7.0% adults with diabetes     CBG: Recent Labs  Lab 10/13/18 1556 10/13/18 1955 10/14/18 0026 10/14/18 0402 10/14/18 0730  GLUCAP 244* 221* 157* 118* 151*     Critical care time: 79    The patient is critically ill with multiple organ system failure and requires high complexity decision making for assessment and support, frequent evaluation and titration of therapies, advanced monitoring, review of radiographic studies and interpretation of complex data.   Critical Care Time devoted to patient care services, exclusive of separately billable procedures, described in this note is 45 minutes.   Marshell Garfinkel MD [facility] Pager [phone] If no answer call [phone] 10/14/2018, 8:24 AM

## 2018-10-14 NOTE — Progress Notes (Signed)
Received oral for swallow evaluation, thank you. Given prolonged intubation 8/31-10/14/2018 will see pt on Tuesday.    Luanna Salk, [place] [facility] SLP Acute Rehab Services Pager [phone] Office [phone]

## 2018-10-14 NOTE — Progress Notes (Signed)
Wasted 110 ml Fentanyl in stericycle with Farrell Ours, RN.

## 2018-10-14 NOTE — Progress Notes (Addendum)
[facility] Progress Note Patient Name: Krystal Short DOB: 04/12/1985 MRN: [id number]   Date of Service  10/14/2018  HPI/Events of Note  Acute episode of SOB and decreased sats into 80's. Placed on BiPAP --> Sat now = 100% and RR = 27.  eICU Interventions  Will order: 1. BiPAP Ipap 15/Epap 5. Keep sats >= 92%. 2. ABG at 10:30 PM. 3. Repeat portable CXR in AM.     Intervention Category Major Interventions: Sepsis - evaluation and management  Sommer,Steven Eugene 10/14/2018, 9:29 PM

## 2018-10-14 NOTE — Progress Notes (Addendum)
Bilateral upper extremity venous duplex completed. Preliminary results in Chart review CV Proc. [facility], [place] 10/14/2018 11:42 AM

## 2018-10-15 ENCOUNTER — Other Ambulatory Visit: Payer: Self-pay | Admitting: Hematology

## 2018-10-15 ENCOUNTER — Inpatient Hospital Stay (HOSPITAL_COMMUNITY): Payer: 59

## 2018-10-15 ENCOUNTER — Inpatient Hospital Stay: Payer: 59 | Attending: Hematology

## 2018-10-15 ENCOUNTER — Inpatient Hospital Stay: Payer: 59

## 2018-10-15 ENCOUNTER — Ambulatory Visit: Payer: 59 | Admitting: Family

## 2018-10-15 LAB — RENAL FUNCTION PANEL
Albumin: 2.4 g/dL — ABNORMAL LOW (ref 3.5–5.0)
Albumin: 2.3 g/dL — ABNORMAL LOW (ref 3.5–5.0)
Anion gap: 10 (ref 5–15)
Anion gap: 10 (ref 5–15)
BUN: 27 mg/dL — ABNORMAL HIGH (ref 6–20)
BUN: 27 mg/dL — ABNORMAL HIGH (ref 6–20)
CO2: 25 mmol/L (ref 22–32)
CO2: 25 mmol/L (ref 22–32)
Calcium: 7.7 mg/dL — ABNORMAL LOW (ref 8.9–10.3)
Calcium: 7.9 mg/dL — ABNORMAL LOW (ref 8.9–10.3)
Chloride: 102 mmol/L (ref 98–111)
Chloride: 101 mmol/L (ref 98–111)
Creatinine, Ser: 1.06 mg/dL — ABNORMAL HIGH (ref 0.44–1.00)
Creatinine, Ser: 1.04 mg/dL — ABNORMAL HIGH (ref 0.44–1.00)
GFR calc Af Amer: >60 mL/min (ref >60)
GFR calc Af Amer: >60 mL/min (ref >60)
GFR calc non Af Amer: >60 mL/min (ref >60)
GFR calc non Af Amer: >60 mL/min (ref >60)
Glucose, Bld: 102 mg/dL — ABNORMAL HIGH (ref 70–99)
Glucose, Bld: 177 mg/dL — ABNORMAL HIGH (ref 70–99)
Phosphorus: 3.6 mg/dL (ref 2.5–4.6)
Phosphorus: 2.3 mg/dL — ABNORMAL LOW (ref 2.5–4.6)
Potassium: 3.9 mmol/L (ref 3.5–5.1)
Potassium: 3.8 mmol/L (ref 3.5–5.1)
Sodium: 137 mmol/L (ref 135–145)
Sodium: 136 mmol/L (ref 135–145)

## 2018-10-15 LAB — CBC
HCT: 25 % — ABNORMAL LOW (ref 36.0–46.0)
Hemoglobin: 8.1 g/dL — ABNORMAL LOW (ref 12.0–15.0)
MCH: 30.3 pg (ref 26.0–34.0)
MCHC: 32.4 g/dL (ref 30.0–36.0)
MCV: 93.6 fL (ref 80.0–100.0)
Platelets: 16 10*3/uL — CL (ref 150–400)
RBC: 2.67 MIL/uL — ABNORMAL LOW (ref 3.87–5.11)
RDW: 17.4 % — ABNORMAL HIGH (ref 11.5–15.5)
WBC: 25.3 10*3/uL — ABNORMAL HIGH (ref 4.0–10.5)
nRBC: 6.4 % — ABNORMAL HIGH (ref 0.0–0.2)

## 2018-10-15 LAB — BPAM PLATELET PHERESIS
Blood Product Expiration Date: 202009072359
Unit Type and Rh: 6200

## 2018-10-15 LAB — GLUCOSE, CAPILLARY
Glucose-Capillary: 117 mg/dL — ABNORMAL HIGH (ref 70–99)
Glucose-Capillary: 126 mg/dL — ABNORMAL HIGH (ref 70–99)
Glucose-Capillary: 61 mg/dL — ABNORMAL LOW (ref 70–99)
Glucose-Capillary: 92 mg/dL (ref 70–99)
Glucose-Capillary: 93 mg/dL (ref 70–99)
Glucose-Capillary: 97 mg/dL (ref 70–99)

## 2018-10-15 LAB — BASIC METABOLIC PANEL
Anion gap: 10 (ref 5–15)
BUN: 28 mg/dL — ABNORMAL HIGH (ref 6–20)
CO2: 24 mmol/L (ref 22–32)
Calcium: 7.6 mg/dL — ABNORMAL LOW (ref 8.9–10.3)
Chloride: 103 mmol/L (ref 98–111)
Creatinine, Ser: 1.13 mg/dL — ABNORMAL HIGH (ref 0.44–1.00)
GFR calc Af Amer: >60 mL/min (ref >60)
GFR calc non Af Amer: >60 mL/min (ref >60)
Glucose, Bld: 100 mg/dL — ABNORMAL HIGH (ref 70–99)
Potassium: 4 mmol/L (ref 3.5–5.1)
Sodium: 137 mmol/L (ref 135–145)

## 2018-10-15 LAB — MAGNESIUM: Magnesium: 2.4 mg/dL (ref 1.7–2.4)

## 2018-10-15 LAB — ADAMTS13 ACTIVITY REFLEX

## 2018-10-15 LAB — PREPARE PLATELET PHERESIS: Unit division: 0

## 2018-10-15 LAB — MISC LABCORP TEST (SEND OUT)
Labcorp test code: 142455
Labcorp test code: 142455

## 2018-10-15 LAB — APTT: aPTT: 32 seconds (ref 24–36)

## 2018-10-15 LAB — C4 COMPLEMENT: Complement C4, Body Fluid: 24 mg/dL (ref 14–44)

## 2018-10-15 LAB — C3 COMPLEMENT: C3 Complement: 127 mg/dL (ref 82–167)

## 2018-10-15 LAB — ADAMTS13 ACTIVITY: Adamts 13 Activity: 34.2 % — ABNORMAL LOW (ref >66.8)

## 2018-10-15 LAB — PHOSPHORUS: Phosphorus: 3.6 mg/dL (ref 2.5–4.6)

## 2018-10-15 MED ORDER — ROMIPLOSTIM 250 MCG ~~LOC~~ SOLR
5.0000 ug/kg | SUBCUTANEOUS | Status: DC
  Administered 2018-10-15 – 2018-10-22 (×2): 250 ug via SUBCUTANEOUS
  Filled 2018-10-15 (×2): qty 0.5

## 2018-10-15 MED ORDER — DEXTROSE 50 % IV SOLN
INTRAVENOUS | Status: AC
  Filled 2018-10-15: qty 50

## 2018-10-15 MED ORDER — DEXTROSE 50 % IV SOLN
12.5000 g | Freq: Once | INTRAVENOUS | Status: AC
  Administered 2018-10-15: 08:00:00 12.5 g via INTRAVENOUS

## 2018-10-15 NOTE — Progress Notes (Signed)
NAMELashena Short, MRN:  [id number], DOB:  1986/01/02, LOS: 9 ADMISSION DATE:  10/05/2018, CONSULTATION DATE:  10/06/2018 REFERRING MD:  Dr. Craig Guess, CHIEF COMPLAINT:  SOB   Brief History   Krystal Short is a 33 yo F with history of HIV/AIDS admitted on 8/29 with progressive shortness of breath. History of multiple admissions over the last year for waxing / waning pulmonary infiltrates. Unfortunately patient decompensated on 8/31 requiring initiation of mechanical intubation and later pressure support and CRRT.    Recent Evaluation  03/08/18-03/10/18 Acute respiratory failure secondary to pneumonia (rhinovirus). tx with rocephin/ zithromax, Bactrim for PCP. Macrocytic anemia   04/22/2018- Flu A infection. Treated with tamiflu as outpatient  05/28/18- 05/31/18 admitted with respiratory failure, lung infiltrates.  Treated with Bactrim, prednisone with improvement  06/14/2018-07/16/2018 Bordetella bronchoseptica infection. Underwent FOB 07/12/2018. PJP test not done as BAL was lost.Biopsy scant lung parenchyma with fibrosis and reactive changes. Required 4 PRBC and 3 platelet infusion.  07/31/2018-08/22/2018 sepsis from suspected PJP pneumonia. Treated with steroids clindamycin and primaquine. Required 8 PRBC and 7 platelet transfusion. RVP positive for routine coronavirus.  09/03/2018-09/13/2018 generalized weakness and deconditioning. Required 3 PRBC 8 platelet transfusion.  7/30/2020IVIGwithout any improvement in platelet count. Given prolonged prednisone taper.  09/11/18 Bone marrowBx all micro studies neg   10/01/2018-10/04/2018 presented with anemia and poor IV access. Given 2 PRBC 1 platelet transfusion. Required pressors. Treated with ceftriaxone and doxy for ? CAP.  Significant Hospital Events   8/29 Admit  8/30 PCCM consulted,started solumedrol 8/30 am @ 80 mg q 12h 8/31 Intubated, FOB 9/1 CRRT  9/4 High dose decadron 20 mg IV qd started by heme due to concern of secondary  [place]. Send soluble CD 25/ IL2Ra levels 9/5 Weaning pressors, starting PSV weans, improving CXR 9/7 - Awake on the vent.  No acute events. Weaning on pressure support 8/5 yesterday  Consults:  PCCM 8/30 ID 8/30 Hem/Onc 8/30 Nephrology 9/1  Procedures:  R IJ CVC 8/30 >> ETT 8/31 >> FOB 8/31 >> L HD cath 9/1 >>  Significant Diagnostic Tests:  Head CT 8/31 >> negative for any acute intracranial process Renal ultrasound 9/2 >> No hydronephrosis, medical renal disease   Chest x-ray 9/6-improving pulmonary infiltrates.  Personally reviewed  Micro Data:  COVID 19 pcr 8/29>>neg SARS COV2 IgG 9/5- neg  BCx 2 8/29 >>no growth 2 days PCP8/31>>neg 9/1 FOB BAL 8/31 >> 50,000 Coag neg staph, PJP negative  Antimicrobials:  Bactrim 8/29 >>8/30 Maxepime 8/29 >> 9/5 PO Atovaquone 8/30 >> PO Azithromycin 9/1 >>  Interim history/subjective:   10/15/2018 - extubated yesterda. Needed bipap lat night for resp "spell". Now on [place] and doing well. Denies vaping . Admits to using DOWN PILLOW  At home. However, denies  any organic antigen (mold, birds, mildew, humidifer) but also wanted to confirm with husband. Called husband but did not pick up  SLP pending - cough was  Hoarse and weak at bedside  ANuric and on CRRT    - Objective   Blood pressure (!) 93/56, pulse 98, temperature 97.7 F (36.5 C), resp. rate (!) 23, height _0  (1.549 m), weight 49.6 kg, SpO2 100 %.    Vent Mode: BIPAP FiO2 (%):  [30 %-100 %] 30 % Set Rate:  [14 bmp] 14 bmp PEEP:  [8 cmH20] 8 cmH20   Intake/Output Summary (Last 24 hours) at 10/15/2018 0925 Last data filed at 10/15/2018 0800 Gross per 24 hour  Intake 557.15 ml  Output 2508 ml  Net -1950.85 ml   Filed Weights   10/13/18 0500 10/14/18 0402 10/15/18 0347  Weight: 58.3 kg 51.8 kg 49.6 kg     General Appearance:  Looks very deconditioned, frail, thin. Off vent Head:  Normocephalic, without obvious abnormality, atraumatic Eyes:  PERRL - yes,  conjunctiva/corneas - muddy     Ears:  Normal external ear canals, both ears Nose:  G tube - no but has [place] Throat:  ETT TUBE - no , OG tube - no Neck:  Supple,  No enlargement/tenderness/nodules Lungs: Clear to auscultation bilaterally but just anterior exam without distress Heart:  S1 and S2 normal, no murmur, CVP - no.  Pressors - no Abdomen:  Soft, no masses, no organomegaly Genitalia / Rectal:  Not done Extremities:  Extremities- intact Skin:  ntact in exposed areas . Sacral area - not examined Neurologic:  Sedation - none -> RASS - +1 . Moves all 4s - yes. CAM-ICU - neg . Orientation - x3+      Resolved Hospital Problem list   CirculatoryShock -likely some component of sedation, +/- sepsis  P: Weaned off pressors  Assessment & Plan:  AcuteRecurrentHypoxemic Respiratory Failure--In the setting of advanced HIV/AIDS now with Staph Epid seen on BAL with bloody secretion  10/15/2018 - remains extubated since 10/14/2018. On 4LNC - pulse ox 99% without distress. Possible down feathered pillow exposure. CXR better. COnsideration for diagnosis of recurrent acute hypersenitivity pneumonitis  P: O2 for pulse ox > 88% biPAP prn if needed Await husband input on down pillow/blanket use    RecurrentBilateralPulmonaryInfiltrates -Staph Epid seen on BAL, PJP neg. BC with no growth P: ABX per ID   Acute encephalopathy > Improved -In the setting of advanced HIV/AIDS, respiratory and renal failure, and profound thrombocytopenia. Head CT neg for any acute abnormalities   10/15/2018 - resolved. Normal mental status and writing  P: Dc prn fent  ProfoundPersistentThrombocytopenia, anemia -Extensive workup completed by Hemo/Onc with BM BX suggesting normal bone marrow. HIT ab negative, no MAHA. Currently on steroids, initially started for possible sarcoid but no granulomas on prior biospy  Still unclear as to why she has had recurrent admissions with lung infiltrates, organ  failure, pancytopenia The most likely explanation is this is secondary to HIV infection, inflammatory state. Possibly could be secondary [place] from HIV or viral infections (noted to have flu infection, coronavirus infection earlier this year).  She has been ruled out several times for SARS-CoV-2 by PCR and SARS-CoV-2 IgG is negative She does meet several of the criteria is for [place] including hepatosplenomegaly, cytopenias, elevated LDH, triglycerides, elevated LFTs.  Oncology note from July notes hemophagocytosis in bone marrow  Started high dose decadron 10/11/2018 by heemo  10/15/2018 -  Platelets slowly impriving  P: Appreciate input from Dr. Irene Limbo, Hematology; Etoposide vs transfer to tertiary facility Awaiting soluble IL2Ra (CD 25) levels  Anemia of chronic and critical illness  - PRBC for hgb </= 6.9gm%    - exceptions are   -  if ACS susepcted/confirmed then transfuse for hgb </= 8.0gm%,  or    -  active bleeding with hemodynamic instability, then transfuse regardless of hemoglobin value   At at all times try to transfuse 1 unit prbc as possible with exception of active hemorrhage     AKI AGMA Hyperkalemia  Hypocalcemia  Likelyrelated to volume depletion vs drug side effects, no evidence of a pulmonary/renal syndrome. Currently on CRRT 2.8L out with 310 urine output seen overnight. FENa 0.5%, renal ultrasound neg  -  10/15/2018 - anuric and on CRRT  P: Continue CRRT - per renal - might go to iHD   HIV  HIV viral load undetectable but CD4 count remains low   P: Antibiotics and antiretroviral medication per ID.  Upper extremity edema left greater than right P: Await Check duplex US to evaluate for DVT.   Malnutrition  - low albumin state  Plan  -await SLP and decide on TF  Transaminitis   9/8  - improving but last check several days ago  Plan  - repeat check  Best practice:  Diet:TF via OG tube Pain/Anxiety/Delirium protocol (if indicated):none VAP protocol  (if indicated):In place DVT prophylaxis:SCD's, allergy to heparin GI prophylaxis:PPI Glucose control:N/A Mobility:BR Code Status:Full Family Communication:Both husband and mom updated daily at bedside and continue to express desire for aggressive measures   - as of 9/.08/2018. On 10/15/2018 - calld husband but wen tto VM  Disposition:ICU . Asked Dr Cyndia Skeeters to assume primary 10/16/18 now that patient is extubated. CCM will follow for pulmonary issue as needed     ATTESTATION & SIGNATURE   The patient Krystal Short is critically ill with multiple organ systems failure and requires high complexity decision making for assessment and support, frequent evaluation and titration of therapies, application of advanced monitoring technologies and extensive interpretation of multiple databases.   Critical Care Time devoted to patient care services described in this note is  30  Minutes. This time reflects time of care of this signee Dr Brand Males. This critical care time does not reflect procedure time, or teaching time or supervisory time of PA/NP/Med student/Med Resident etc but could involve care discussion time     Dr. Brand Males, M.D., [facility].C.P Pulmonary and Critical Care Medicine Staff Physician [facility] Pager: [phone], If no answer or between  15:00h - 7:00h: call [phone]  10/15/2018 9:56 AM    LABS    PULMONARY Recent Labs  Lab 10/08/18 1139 10/09/18 1528 10/10/18 0420 10/13/18 0430 10/14/18 2230  PHART 7.230* 7.332* 7.393 7.490* 7.480*  PCO2ART 38.6 49.9* 43.2 34.3 35.2  PO2ART 86.7 50.9* 292* 99.6 467*  HCO3 15.7* 25.7 26.0 25.9 25.9  O2SAT 96.6 81.9 99.6 98.4 100.0    CBC Recent Labs  Lab 10/13/18 0343 10/14/18 0404 10/15/18 0330  HGB 6.6* 8.8* 8.1*  HCT 20.1* 26.4* 25.0*  WBC 18.6* 21.0* 25.3*  PLT 10* 13* 16*    COAGULATION Recent Labs  Lab 10/11/18 1227  INR 1.3*    CARDIAC   No results for input(s): TROPONINI in the last 168 hours. No results for input(s): PROBNP in the last 168 hours.   CHEMISTRY Recent Labs  Lab 10/11/18 0455  10/12/18 0407  10/13/18 0343 10/13/18 0939 10/13/18 1716 10/14/18 0404 10/14/18 1708 10/15/18 0330  NA 139   < > 137   < > 137 137 136 136 136 137  137  K 3.5   < > 3.4*   < > 3.6 3.4* 4.2 3.7 4.2 3.9  4.0  CL 105   < > 102   < > 100 104 102 101 100 102  103  CO2 25   < > 25   < > _0 GLUCOSE 207*   < > 191*   < > 168* 187* 270* 162* 282* 102*  100*  BUN 27*   < > 27*   < > 33* 31*  37* 35* 35* 27*  28*  CREATININE 0.94   < > 0.90   < > 0.98 0.96 0.99 0.95 1.00 1.06*  1.13*  CALCIUM 7.1*   < > 7.5*   < > 7.5* 7.6* 7.1* 7.6* 7.4* 7.7*  7.6*  MG 2.4  --  2.5*  --  2.3  --   --  2.4  --  2.4  PHOS 1.7*   < > 1.3*   < > 2.2*  2.3*  --  3.6 2.4*  2.5 9.6* 3.6  3.6   < > = values in this interval not displayed.   Estimated Creatinine Clearance: 58 mL/min (A) (by C-G formula based on SCr of 1.06 mg/dL (H)).   LIVER Recent Labs  Lab 10/08/18 0956 10/08/18 1053  10/11/18 1227  10/12/18 0910  10/13/18 0939 10/13/18 1716 10/14/18 0404 10/14/18 1708 10/15/18 0330  AST 110* 129*  --  56*  --  57*  --  59*  --   --   --   --   ALT 20 24  --  21  --  25  --  32  --   --   --   --   ALKPHOS 254* 291*  --  374*  --  406*  --  442*  --   --   --   --   BILITOT 3.7* 4.4*  --  3.8*  --  3.4*  --  3.4*  --   --   --   --   PROT 4.4* 5.1*  --  5.7*  --  5.5*  --  6.0*  --   --   --   --   ALBUMIN 1.4* 1.5*   < > 1.9*   < > 1.8*   < > 2.1* 2.0* 2.1* 2.1* 2.4*  INR  --   --   --  1.3*  --   --   --   --   --   --   --   --    < > = values in this interval not displayed.     INFECTIOUS No results for input(s): LATICACIDVEN, PROCALCITON in the last 168 hours.   ENDOCRINE CBG (last 3)  Recent Labs    10/15/18 0005 10/15/18 0731 10/15/18 0829  GLUCAP 97 61* 92         IMAGING x48h  -  image(s) personally visualized  -   highlighted in bold Dg Chest Port 1 View  Result Date: 10/15/2018 CLINICAL DATA:  Hypoxia. EXAM: PORTABLE CHEST 1 VIEW COMPARISON:  Radiograph of October 14, 2018. FINDINGS: The heart size and mediastinal contours are within normal limits. Endotracheal and nasogastric tubes have been removed. Bilateral internal jugular catheters are unchanged in position. Stable bilateral bibasilar opacities are noted concerning for possible atelectasis or inflammation. The visualized skeletal structures are unremarkable. IMPRESSION: Endotracheal and nasogastric tubes have been removed. Stable bilateral lung opacities as described above. Electronically Signed   By: Marijo Conception M.D.   On: 10/15/2018 07:08   Dg Chest Port 1 View  Result Date: 10/14/2018 CLINICAL DATA:  resp failure EXAM: PORTABLE CHEST 1 VIEW COMPARISON:  10/13/2018 FINDINGS: Endotracheal tube is in place, tip 4.8 centimeters above the carina. RIGHT IJ central line tip overlies the superior vena cava. LEFT IJ central line tip overlies the superior vena cava. Patient is rotated towards the LEFT. Nasogastric tube tip is off the image. The heart is normal in size. Diffuse ground-glass opacities  are identified throughout the lungs bilaterally without significant change. No new consolidation. IMPRESSION: Stable bilateral pulmonary infiltrates. Electronically Signed   By: Nolon Nations M.D.   On: 10/14/2018 09:03   Vas [place] Upper Extremity Venous Duplex  Result Date: 10/14/2018 UPPER VENOUS STUDY  Indications: Edema Limitations: Bandages and Edema and size of the veins. Comparison Study: No previous study available Performing Technologist: Toma Copier RVS  Examination Guidelines: A complete evaluation includes B-mode imaging, spectral Doppler, color Doppler, and power Doppler as needed of all accessible portions of each vessel. Bilateral testing is considered an integral part of a complete examination. Limited  examinations for reoccurring indications may be performed as noted.  Right Findings: +----------+------------+---------+-----------+----------+---------------------+ RIGHT     CompressiblePhasicitySpontaneousProperties       Summary        +----------+------------+---------+-----------+----------+---------------------+ IJV                                                 Not visualized due to                                                        dialysis access    +----------+------------+---------+-----------+----------+---------------------+ Subclavian    Full       Yes       Yes                                    +----------+------------+---------+-----------+----------+---------------------+ Axillary      Full       Yes       Yes                                    +----------+------------+---------+-----------+----------+---------------------+ Brachial      Full       Yes       Yes                                    +----------+------------+---------+-----------+----------+---------------------+ Radial        Full                                                        +----------+------------+---------+-----------+----------+---------------------+ Ulnar         Full                                                        +----------+------------+---------+-----------+----------+---------------------+ Cephalic                                            Not  visualized due to                                                     edema and size of the                                                             vein          +----------+------------+---------+-----------+----------+---------------------+ Basilic                                             Not visualized due to                                                     the size of the vein  +----------+------------+---------+-----------+----------+---------------------+  Technically limited due to dialysis access in the neck, size and edema.  Left Findings: +----------+------------+---------+-----------+----------+---------------------+ LEFT      CompressiblePhasicitySpontaneousProperties       Summary        +----------+------------+---------+-----------+----------+---------------------+ IJV                                                 Not visualized due to                                                         IV placement      +----------+------------+---------+-----------+----------+---------------------+ Subclavian                                          Not visualized due to                                                        bandages for IV                                                              placement       +----------+------------+---------+-----------+----------+---------------------+ Axillary      Full       Yes       Yes                                    +----------+------------+---------+-----------+----------+---------------------+  Brachial      Full       Yes       Yes                                    +----------+------------+---------+-----------+----------+---------------------+ Radial        Full                                                        +----------+------------+---------+-----------+----------+---------------------+ Ulnar         Full                                                        +----------+------------+---------+-----------+----------+---------------------+ Cephalic                                            Not visualize due to                                                         size and edema     +----------+------------+---------+-----------+----------+---------------------+ Basilic                                             Not visualized due to                                                        size and edema      +----------+------------+---------+-----------+----------+---------------------+ Technically limited due to bandages, IV placement, size. and edema  Summary:  Right: No evidence of deep vein thrombosis in the upper extremity. However, unable to visualize all veins. See comments listed above. No evidence of superficial vein thrombosis . However, unable to visualize See comments listed above. No evidence of thrombosis in the subclavian. This was a limited study.  Left: No evidence of deep vein thrombosis in the upper extremity. However, unable to visualize all veins. See comments listed avone. No evidence of superficial vein thrombosis . However, unable to visualize all veins. See comments listed avone. Unable to visualize the subclavian/ See comments listed abone. This was a limited study.  *See table(s) above for measurements and observations.     Preliminary

## 2018-10-15 NOTE — Evaluation (Signed)
Clinical/Bedside Swallow Evaluation Patient Details  Name: Krystal Short MRN: [id number] Date of Birth: October 15, 1985  Today's Date: 10/15/2018 Time: SLP Start Time (ACUTE ONLY): 1140 SLP Stop Time (ACUTE ONLY): 1210 SLP Time Calculation (min) (ACUTE ONLY): 30 min  Past Medical History:  Past Medical History:  Diagnosis Date  . Acute hyponatremia 06/03/2017  . Anemia   . CAP (community acquired pneumonia) 06/03/2017  . Facial dermatitis 06/03/2017  . HIV (human immunodeficiency virus infection) ([place])    diagnosed in April 2019  . Pleurisy 06/03/2017  . 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 ([employer])   . Thrush of mouth and esophagus ([place])   . UTI (urinary tract infection)    Past Surgical History:  Past Surgical History:  Procedure Laterality Date  . BIOPSY  07/12/2018   Procedure: BIOPSY;  Surgeon: Laurin Coder, MD;  Location: [facility];  Service: Endoscopy;;  . BRONCHIAL WASHINGS  07/12/2018   Procedure: BRONCHIAL WASHINGS;  Surgeon: Laurin Coder, MD;  Location: [facility];  Service: Endoscopy;;  . ENDOBRONCHIAL ULTRASOUND N/A 07/12/2018   Procedure: ENDOBRONCHIAL ULTRASOUND;  Surgeon: Laurin Coder, MD;  Location: [facility];  Service: Endoscopy;  Laterality: N/A;  . FINE NEEDLE ASPIRATION BIOPSY  07/12/2018   Procedure: FINE NEEDLE ASPIRATION BIOPSY;  Surgeon: Laurin Coder, MD;  Location: [facility];  Service: Endoscopy;;  . NO PAST SURGERIES    . VIDEO BRONCHOSCOPY N/A 07/12/2018   Procedure: VIDEO BRONCHOSCOPY WITHOUT FLUORO;  Surgeon: Laurin Coder, MD;  Location: [facility];  Service: Endoscopy;  Laterality: N/A;   HPI:  33 yo F with history of HIV/AIDS admitted on 8/29 with progressive shortness of breath. History of multiple admissions over the last year for waxing / waning pulmonary infiltrates. Patient decompensated on 8/31 requiring initiation of mechanical intubation and later pressure support and CRRT, was extubated 10/14/2018.   Swallow eval ordered.  Pt denies h/o dysphagia.  .   Assessment / Plan / Recommendation Clinical Impression  Pt presents with indications concerning for pharyngeal dysphagia including aphonia (whisper) and bruising on soft palate (on left) concerning for pharyngeal edema.  Pt also with retained oral secretions that she is not swallowing.  Provided her with single ice chips which she did allow time to melt and swallow.   Patient did present with delayed cough that was productive to viscous yellow tinged secretions.  At this time, do not recommend pt have a po diet due to her prolonged intubation, probable pharyngeal edema, aphonia and positive indication of aspiration.  Advise pt be allowed single ice chips - after oral care.  Advised if pt reflexively coughing with ice to cough to clear and rest.  Reviewed goals with pt to prepare for transiting to po diet - including - phonation ability, swallowing saliva and strong volitional cough.  Mother in room and was educated to findings recommendations.  SlP provided pt with communication board and reviewed free communication apps for her iphone to faciliate communication. SLP Visit Diagnosis: Dysphagia, pharyngeal phase (R13.13)    Aspiration Risk  Risk for inadequate nutrition/hydration;Severe aspiration risk    Diet Recommendation NPO;Ice chips PRN after oral care   Medication Administration: Via alternative means Compensations: Slow rate;Small sips/bites    Other  Recommendations Oral Care Recommendations: Oral care QID   Follow up Recommendations        Frequency and Duration min 2x/week  2 weeks       Prognosis Prognosis for Safe Diet Advancement: Good  Swallow Study   General Date of Onset: 10/15/18 HPI: 33 yo F with history of HIV/AIDS admitted on 8/29 with progressive shortness of breath. History of multiple admissions over the last year for waxing / waning pulmonary infiltrates. Patient decompensated on 8/31 requiring initiation  of mechanical intubation and later pressure support and CRRT, was extubated 10/14/2018.  Swallow eval ordered.  Pt denies h/o dysphagia.  . Type of Study: Bedside Swallow Evaluation Diet Prior to this Study: NPO Temperature Spikes Noted: No Respiratory Status: Room air History of Recent Intubation: Yes Length of Intubations (days): 8 days Date extubated: 10/14/18 Behavior/Cognition: Alert;Cooperative Oral Cavity Assessment: Excessive secretions(bruising posterior oral cavity - soft palate on left) Oral Care Completed by SLP: No Oral Cavity - Dentition: Adequate natural dentition Vision: Functional for self-feeding Self-Feeding Abilities: Able to feed self Patient Positioning: Upright in bed Baseline Vocal Quality: (whisper) Volitional Cough: Strong(strong reflexive cough, cleared yellow tinged secretions) Volitional Swallow: Able to elicit    Oral/Motor/Sensory Function Overall Oral Motor/Sensory Function: Within functional limits   Ice Chips Ice chips: Impaired Presentation: Spoon Pharyngeal Phase Impairments: Cough - Delayed Other Comments: delayed cough that was productive to yellow tinged viscous secretions   Thin Liquid Thin Liquid: Not tested    Nectar Thick Nectar Thick Liquid: Not tested   Honey Thick Honey Thick Liquid: Not tested   Puree Puree: Not tested   Solid     Solid: Not tested      Macario Golds 10/15/2018,12:27 PM  Luanna Salk, [place] Pager [phone] Office [phone]

## 2018-10-15 NOTE — TOC Progression Note (Signed)
Transition of Care [facility]) - Progression Note    Patient Details  Name: Krystal Short MRN: [id number] Date of Birth: [birth year]  Transition of Care [facility]) CM/SW Contact  Aveline Daus, Marjie Skiff, RN Phone [phone] 10/15/2018, 11:16 AM  Clinical Narrative:    Pt was extubated yesterday. TOC continues to follow for disposition needs   Readmission Risk Interventions Readmission Risk Prevention Plan 09/10/2018 08/08/2018  Transportation Screening Complete Complete  Medication Review [employer] photographer) Complete Complete  PCP or Specialist appointment within 3-5 days of discharge Not Complete Not Complete  PCP/Specialist Appt Not Complete comments Not close to dc Not ready for DC  HRI or [place] Not Complete Not Complete  HRI or Home Care Consult Pt Refusal Comments NA NA  SW Recovery Care/Counseling Consult Not Complete Not Complete  SW Consult Not Complete Comments NA NA  Palliative Care Screening Not Complete Not Complete  Comments NA NA  Skilled Nursing Facility Not Complete Not Complete  SNF Comments NA NA  Some recent data might be hidden

## 2018-10-15 NOTE — Progress Notes (Signed)
Patient ID: Krystal Short, female   DOB: 1985-09-16, 33 y.o.   MRN: [id number]          [facility]    Date of Admission:  10/05/2018     As she has on previous admissions, she is improving slowly.  She has completed empiric therapy for HCAP.  She is afebrile and her infiltrates are clearing.  I plan on continuing her Biktarvy, atovaquone and azithromycin.  She has follow-up in our clinic on 11/05/2018.  I will sign off now.         Michel Bickers, MD [facility] for [facility] [phone] pager   [phone] cell 10/15/2018, 12:20 PM

## 2018-10-15 NOTE — Progress Notes (Signed)
Spoke with patient and husband regarding down pillows/blankets within home.   To the best of their knowledge there are no down/feather blankets within the home however patient purchased 2 new down/feather pillows approx. 7 months ago and are they are only being used in their personal bedroom.   Husband to confirm brand once returning to home, will pass along to oncoming RN to follow up on brand of pillows.

## 2018-10-15 NOTE — Progress Notes (Signed)
Pt placed on bipap for increased rr 30+ and  increased wob with o2 sats mid-high 80s.  RN aware, present at bedside.  RT will continue to monitor and assess pt.

## 2018-10-15 NOTE — Progress Notes (Signed)
Patient ID: Krystal Short, female   DOB: [birth year], 33 y.o.   MRN: [id number] [facility] Progress Note   Assessment/ Plan:   1. Acute kidney Injury: Suspected to be secondary to ATN from septic shock.  On CRRT and without any evident renal recovery yet-anuric.  With soft blood pressures but without the need for pressor support.  She likely has dense ATN versus cortical necrosis based on sluggish progress so far.  Plan to discontinue CRRT in the next 24 hours or so and consider transition to IHD. 2.  Acute/recurrent hypoxemic respiratory failure: With bronchoalveolar lavage showing Staphylococcus epidermidis for which he is on antimicrobial coverage.  Testing for PJP negative. 3.  Thrombocytopenia/anemia: Ongoing work-up including bone marrow biopsy recently undertaken.  Additional management per hematology.  Anemia likely exacerbated by acute/critical illness. 4.  Hyperphosphatemia: Secondary to CRRT losses/GI losses, will replace intravenously  Subjective:   Without acute events overnight, anuric.  Denies any shortness of breath or chest pain.   Objective:   BP (!) 85/59   Pulse (!) 104   Temp 97.9 F (36.6 C)   Resp (!) 22   Ht _0  (1.549 m)   Wt 49.6 kg   LMP  (LMP Unknown) Comment: pt is on depo  SpO2 95%   BMI 20.66 kg/m   Intake/Output Summary (Last 24 hours) at 10/15/2018 1158 Last data filed at 10/15/2018 1100 Gross per 24 hour  Intake 452.15 ml  Output 2613 ml  Net -2160.85 ml   Weight change: -2.2 kg  Physical Exam: Gen: Comfortably comfortably resting in bed, on CRRT, molluscum rash on face.  Family at bedside. CVS: Pulse regular tachycardia, S1 and S2 with ejection systolic murmur Resp: Coarse breath sounds bilaterally without distinct rales or rhonchi Abd: Soft, flat, nontender Ext: Trace-1+ edema over both legs  Imaging: Dg Chest Port 1 View  Result Date: 10/15/2018 CLINICAL DATA:  Hypoxia. EXAM: PORTABLE CHEST 1 VIEW COMPARISON:  Radiograph of  October 14, 2018. FINDINGS: The heart size and mediastinal contours are within normal limits. Endotracheal and nasogastric tubes have been removed. Bilateral internal jugular catheters are unchanged in position. Stable bilateral bibasilar opacities are noted concerning for possible atelectasis or inflammation. The visualized skeletal structures are unremarkable. IMPRESSION: Endotracheal and nasogastric tubes have been removed. Stable bilateral lung opacities as described above. Electronically Signed   By: Marijo Conception M.D.   On: 10/15/2018 07:08   Dg Chest Port 1 View  Result Date: 10/14/2018 CLINICAL DATA:  resp failure EXAM: PORTABLE CHEST 1 VIEW COMPARISON:  10/13/2018 FINDINGS: Endotracheal tube is in place, tip 4.8 centimeters above the carina. RIGHT IJ central line tip overlies the superior vena cava. LEFT IJ central line tip overlies the superior vena cava. Patient is rotated towards the LEFT. Nasogastric tube tip is off the image. The heart is normal in size. Diffuse ground-glass opacities are identified throughout the lungs bilaterally without significant change. No new consolidation. IMPRESSION: Stable bilateral pulmonary infiltrates. Electronically Signed   By: Nolon Nations M.D.   On: 10/14/2018 09:03   Vas [place] Upper Extremity Venous Duplex  Result Date: 10/14/2018 UPPER VENOUS STUDY  Indications: Edema Limitations: Bandages and Edema and size of the veins. Comparison Study: No previous study available Performing Technologist: Toma Copier RVS  Examination Guidelines: A complete evaluation includes B-mode imaging, spectral Doppler, color Doppler, and power Doppler as needed of all accessible portions of each vessel. Bilateral testing is considered an integral part of a complete examination. Limited examinations  for reoccurring indications may be performed as noted.  Right Findings: +----------+------------+---------+-----------+----------+---------------------+ RIGHT      CompressiblePhasicitySpontaneousProperties       Summary        +----------+------------+---------+-----------+----------+---------------------+ IJV                                                 Not visualized due to                                                        dialysis access    +----------+------------+---------+-----------+----------+---------------------+ Subclavian    Full       Yes       Yes                                    +----------+------------+---------+-----------+----------+---------------------+ Axillary      Full       Yes       Yes                                    +----------+------------+---------+-----------+----------+---------------------+ Brachial      Full       Yes       Yes                                    +----------+------------+---------+-----------+----------+---------------------+ Radial        Full                                                        +----------+------------+---------+-----------+----------+---------------------+ Ulnar         Full                                                        +----------+------------+---------+-----------+----------+---------------------+ Cephalic                                            Not visualized due to                                                     edema and size of the  vein          +----------+------------+---------+-----------+----------+---------------------+ Basilic                                             Not visualized due to                                                     the size of the vein  +----------+------------+---------+-----------+----------+---------------------+ Technically limited due to dialysis access in the neck, size and edema.  Left Findings: +----------+------------+---------+-----------+----------+---------------------+ LEFT       CompressiblePhasicitySpontaneousProperties       Summary        +----------+------------+---------+-----------+----------+---------------------+ IJV                                                 Not visualized due to                                                         IV placement      +----------+------------+---------+-----------+----------+---------------------+ Subclavian                                          Not visualized due to                                                        bandages for IV                                                              placement       +----------+------------+---------+-----------+----------+---------------------+ Axillary      Full       Yes       Yes                                    +----------+------------+---------+-----------+----------+---------------------+ Brachial      Full       Yes       Yes                                    +----------+------------+---------+-----------+----------+---------------------+ Radial        Full                                                        +----------+------------+---------+-----------+----------+---------------------+  Ulnar         Full                                                        +----------+------------+---------+-----------+----------+---------------------+ Cephalic                                            Not visualize due to                                                         size and edema     +----------+------------+---------+-----------+----------+---------------------+ Basilic                                             Not visualized due to                                                        size and edema     +----------+------------+---------+-----------+----------+---------------------+ Technically limited due to bandages, IV placement, size. and edema  Summary:  Right: No evidence  of deep vein thrombosis in the upper extremity. However, unable to visualize all veins. See comments listed above. No evidence of superficial vein thrombosis . However, unable to visualize See comments listed above. No evidence of thrombosis in the subclavian. This was a limited study.  Left: No evidence of deep vein thrombosis in the upper extremity. However, unable to visualize all veins. See comments listed avone. No evidence of superficial vein thrombosis . However, unable to visualize all veins. See comments listed avone. Unable to visualize the subclavian/ See comments listed abone. This was a limited study.  *See table(s) above for measurements and observations.     Preliminary     Labs: BMET Recent Labs  Lab 10/12/18 0407  10/12/18 1600 10/13/18 0343 10/13/18 0939 10/13/18 1716 10/14/18 0404 10/14/18 1708 10/15/18 0330  NA 137   < > 137 137 137 136 136 136 137  137  K 3.4*   < > 4.1 3.6 3.4* 4.2 3.7 4.2 3.9  4.0  CL 102   < > 106 100 104 102 101 100 102  103  CO2 25   < > _0 GLUCOSE 191*   < > 223* 168* 187* 270* 162* 282* 102*  100*  BUN 27*   < > 30* 33* 31* 37* 35* 35* 27*  28*  CREATININE 0.90   < > 0.87 0.98 0.96 0.99 0.95 1.00 1.06*  1.13*  CALCIUM 7.5*   < > 7.1* 7.5* 7.6* 7.1* 7.6* 7.4* 7.7*  7.6*  PHOS 1.3*  --  1.0* 2.2*  2.3*  --  3.6 2.4*  2.5 9.6* 3.6  3.6   < > = values  in this interval not displayed.   CBC Recent Labs  Lab 10/11/18 0455  10/12/18 0407 10/13/18 0343 10/14/18 0404 10/15/18 0330  WBC 15.0*  --  17.8* 18.6* 21.0* 25.3*  NEUTROABS 12.6*  --  13.1*  --   --   --   HGB 7.9*  --  7.6* 6.6* 8.8* 8.1*  HCT 23.9*  --  23.3* 20.1* 26.4* 25.0*  MCV 90.5  --  92.1 93.5 91.7 93.6  PLT 7*   < > 11* 10* 13* 16*   < > = values in this interval not displayed.    Medications:    . atovaquone  1,500 mg Oral Daily  . azithromycin  1,200 mg Oral Q Tue  . benzonatate  100 mg Oral TID  . chlorhexidine  15 mL Mouth Rinse  BID  . chlorhexidine gluconate (MEDLINE KIT)  15 mL Mouth Rinse BID  . Chlorhexidine Gluconate Cloth  6 each Topical Daily  . emtricitabine-tenofovir AF  1 tablet Per Tube Daily   And  . dolutegravir  50 mg Oral Daily  . feeding supplement (PRO-STAT SUGAR FREE 64)  30 mL Per Tube BID  . guaiFENesin-dextromethorphan  7.5 mL Per Tube Q6H  . insulin aspart  2-6 Units Subcutaneous Q4H  . lidocaine  1 patch Transdermal Q24H  . mouth rinse  15 mL Mouth Rinse 10 times per day  . mometasone-formoterol  2 puff Inhalation BID  . pantoprazole sodium  40 mg Per Tube Daily  . sodium chloride flush  10-40 mL Intracatheter Q12H   Elmarie Shiley, MD 10/15/2018, 11:58 AM

## 2018-10-16 ENCOUNTER — Inpatient Hospital Stay (HOSPITAL_COMMUNITY): Payer: 59

## 2018-10-16 DIAGNOSIS — D761 Hemophagocytic lymphohistiocytosis: Secondary | ICD-10-CM

## 2018-10-16 DIAGNOSIS — R748 Abnormal levels of other serum enzymes: Secondary | ICD-10-CM

## 2018-10-16 DIAGNOSIS — E440 Moderate protein-calorie malnutrition: Secondary | ICD-10-CM

## 2018-10-16 DIAGNOSIS — G9341 Metabolic encephalopathy: Secondary | ICD-10-CM

## 2018-10-16 DIAGNOSIS — E872 Acidosis, unspecified: Secondary | ICD-10-CM

## 2018-10-16 DIAGNOSIS — Z9289 Personal history of other medical treatment: Secondary | ICD-10-CM

## 2018-10-16 LAB — CBC WITH DIFFERENTIAL/PLATELET
Abs Immature Granulocytes: 1.73 10*3/uL — ABNORMAL HIGH (ref 0.00–0.07)
Basophils Absolute: 0.1 10*3/uL (ref 0.0–0.1)
Basophils Relative: 0 %
Eosinophils Absolute: 0 10*3/uL (ref 0.0–0.5)
Eosinophils Relative: 0 %
HCT: 24.6 % — ABNORMAL LOW (ref 36.0–46.0)
Hemoglobin: 7.8 g/dL — ABNORMAL LOW (ref 12.0–15.0)
Immature Granulocytes: 10 %
Lymphocytes Relative: 6 %
Lymphs Abs: 1 10*3/uL (ref 0.7–4.0)
MCH: 30.6 pg (ref 26.0–34.0)
MCHC: 31.7 g/dL (ref 30.0–36.0)
MCV: 96.5 fL (ref 80.0–100.0)
Monocytes Absolute: 0.7 10*3/uL (ref 0.1–1.0)
Monocytes Relative: 4 %
Neutro Abs: 13.4 10*3/uL — ABNORMAL HIGH (ref 1.7–7.7)
Neutrophils Relative %: 80 %
Platelets: 21 10*3/uL — CL (ref 150–400)
RBC: 2.55 MIL/uL — ABNORMAL LOW (ref 3.87–5.11)
RDW: 18.6 % — ABNORMAL HIGH (ref 11.5–15.5)
WBC: 16.8 10*3/uL — ABNORMAL HIGH (ref 4.0–10.5)
nRBC: 10.8 % — ABNORMAL HIGH (ref 0.0–0.2)

## 2018-10-16 LAB — HEPATIC FUNCTION PANEL
ALT: 35 U/L (ref 0–44)
AST: 58 U/L — ABNORMAL HIGH (ref 15–41)
Albumin: 2.4 g/dL — ABNORMAL LOW (ref 3.5–5.0)
Alkaline Phosphatase: 370 U/L — ABNORMAL HIGH (ref 38–126)
Bilirubin, Direct: 0.9 mg/dL — ABNORMAL HIGH (ref 0.0–0.2)
Indirect Bilirubin: 1.6 mg/dL — ABNORMAL HIGH (ref 0.3–0.9)
Total Bilirubin: 2.5 mg/dL — ABNORMAL HIGH (ref 0.3–1.2)
Total Protein: 6.8 g/dL (ref 6.5–8.1)

## 2018-10-16 LAB — COMPLEMENT, TOTAL: Compl, Total (CH50): >60 U/mL (ref >41)

## 2018-10-16 LAB — RENAL FUNCTION PANEL
Albumin: 2.5 g/dL — ABNORMAL LOW (ref 3.5–5.0)
Anion gap: 7 (ref 5–15)
BUN: 31 mg/dL — ABNORMAL HIGH (ref 6–20)
CO2: 26 mmol/L (ref 22–32)
Calcium: 7.7 mg/dL — ABNORMAL LOW (ref 8.9–10.3)
Chloride: 101 mmol/L (ref 98–111)
Creatinine, Ser: 1.09 mg/dL — ABNORMAL HIGH (ref 0.44–1.00)
GFR calc Af Amer: >60 mL/min (ref >60)
GFR calc non Af Amer: >60 mL/min (ref >60)
Glucose, Bld: 173 mg/dL — ABNORMAL HIGH (ref 70–99)
Phosphorus: 2.4 mg/dL — ABNORMAL LOW (ref 2.5–4.6)
Potassium: 4.2 mmol/L (ref 3.5–5.1)
Sodium: 134 mmol/L — ABNORMAL LOW (ref 135–145)

## 2018-10-16 LAB — GLUCOSE, CAPILLARY
Glucose-Capillary: 115 mg/dL — ABNORMAL HIGH (ref 70–99)
Glucose-Capillary: 134 mg/dL — ABNORMAL HIGH (ref 70–99)
Glucose-Capillary: 140 mg/dL — ABNORMAL HIGH (ref 70–99)
Glucose-Capillary: 222 mg/dL — ABNORMAL HIGH (ref 70–99)
Glucose-Capillary: 249 mg/dL — ABNORMAL HIGH (ref 70–99)
Glucose-Capillary: 254 mg/dL — ABNORMAL HIGH (ref 70–99)
Glucose-Capillary: 95 mg/dL (ref 70–99)

## 2018-10-16 LAB — MISC LABCORP TEST (SEND OUT): Labcorp test code: 139326

## 2018-10-16 LAB — APTT: aPTT: 34 seconds (ref 24–36)

## 2018-10-16 LAB — MAGNESIUM: Magnesium: 2.5 mg/dL — ABNORMAL HIGH (ref 1.7–2.4)

## 2018-10-16 MED ORDER — FENTANYL CITRATE (PF) 100 MCG/2ML IJ SOLN: 12.5000 ug | INTRAMUSCULAR | Status: DC | PRN

## 2018-10-16 MED ORDER — DEXAMETHASONE SODIUM PHOSPHATE 4 MG/ML IJ SOLN
16.0000 mg | INTRAMUSCULAR | Status: DC
  Administered 2018-10-16 – 2018-10-21 (×6): 16 mg via INTRAVENOUS
  Filled 2018-10-16 (×6): qty 4

## 2018-10-16 MED ORDER — VITAL 1.5 CAL PO LIQD
1000.0000 mL | ORAL | Status: DC
  Administered 2018-10-16 – 2018-10-17 (×2): 1000 mL
  Filled 2018-10-16 (×7): qty 1000

## 2018-10-16 MED ORDER — SODIUM CHLORIDE 0.9 % IV BOLUS
500.0000 mL | Freq: Once | INTRAVENOUS | Status: AC
  Administered 2018-10-16: 500 mL via INTRAVENOUS

## 2018-10-16 MED ORDER — PRO-STAT SUGAR FREE PO LIQD
30.0000 mL | Freq: Four times a day (QID) | ORAL | Status: DC
  Administered 2018-10-16 – 2018-10-23 (×12): 30 mL
  Filled 2018-10-16 (×15): qty 30

## 2018-10-16 NOTE — Progress Notes (Signed)
eLink Physician-Brief Progress Note Patient Name: Krystal Short DOB: September 02, 1985 MRN: [id number]   Date of Service  10/16/2018  HPI/Events of Note  Pain not relieved by tylenol  eICU Interventions  Fentanyl 12.5 mg iv Q 4 hours prn pain not relieved by Tylenol     Intervention Category Intermediate Interventions: Pain - evaluation and management  Frederik Pear 10/16/2018, 8:32 PM

## 2018-10-16 NOTE — Progress Notes (Signed)
Patient BP 88/54 (64). HR elevated at 127 bpm. Keeping patient even this hour until BP stabilizes. Ramaswamy MD to call Renal Posey Pronto, MD) this AM. Mother at bedside and updated.

## 2018-10-16 NOTE — Progress Notes (Signed)
Nutrition Follow-up  DOCUMENTATION CODES:   Not applicable  INTERVENTION:  - will switch TF order: Vital 1.5 @ 45 ml/hr with 30 ml prostat QID. - this regimen will provide 2020 kcal, 133 grams protein, and 825 ml free water. - free water flush, if desired, to be per MD/Nephrology given patient on CRRT.    NUTRITION DIAGNOSIS:   Inadequate oral intake related to inability to eat as evidenced by NPO status. -ongoing  GOAL:   Patient will meet greater than or equal to 90% of their needs -will be met with TF regimen  MONITOR:   TF tolerance, Labs, Weight trends  ASSESSMENT:   33 y.o. female with medical history significant for advanced HIV, normocytic anemia, thrombocytopenia, and recent admission for sepsis d/t suspected pneumocystis pneumonia. She presented to the ED from the [place] on 8/25 for evaluation of symptomatic anemia, hypotension, and SOB. She reported on and off symptoms of lightheadedness, generalized weakness, SOB, nonproductive cough over the last 3-4 days. She reported a single episode of fever with diaphoresis. She denied any chest pain, N/V, abdominal pain, diarrhea, or dysuria. She went to the [facility] for routine Nplate injection and transfusion of RBCs. She was therefore sent to the ED for possible need of central line and blood transfusion.  Significant Events: 8/29- admission 8/31- intubation and OGT placement 9/1- HD cath placement; CRRT initiation 9/7- extubation, OGT removal 9/8- small bore NGT placement   Patient now off of the vent on room air at the time of RD visit. Small bore NGT in place and patient is receiving Vital High Protein @ 55 ml/hr with 30 ml prostat BID. This regimen is providing 1520 kcal, 145 grams protein, and 1103 ml free water. Patient denied any nausea or abdominal pain or pressure this AM. Spoke with RN who reports that there have been no issues with TF tolerance. Alerted her to plan to change TF regimen now that estimated  needs have been adjusted s/p extubation; estimated needs based on current weight (50.7 kg). SLP worked with patient yesterday and recommended patient remain NPO.   Per notes: - encephalopathy--improved - profound persistent thrombocytopenia/anemia - anemia of chronic and critical illness - AKI--remains on CRRT, possible switch to iHD - HIV--viral load undetectable - malnutrition d/t hypoalbuminemia - transaminitis    Labs reviewed; Na: 134 mmol/l, BUN: 31 mg/dl, creatinine: 1.09 mg/dl, Ca: 7.7 mg/dl, Phos: 2.4 mg/dl, Mg: 2.5 mg/dl, Alk Phos elevated.  Medications reviewed; sliding scale novolog.     Diet Order:   Diet Order            Diet NPO time specified  Diet effective now              EDUCATION NEEDS:   No education needs have been identified at this time  Skin:  Skin Assessment: Reviewed RN Assessment  Last BM:  9/9  Height:   Ht Readings from Last 1 Encounters:  10/06/18 5' 1"  (1.549 m)    Weight:   Wt Readings from Last 1 Encounters:  10/16/18 50.7 kg    Ideal Body Weight:  47.7 kg  BMI:  Body mass index is 21.12 kg/m.  Estimated Nutritional Needs:   Kcal:  1927-2130 kcal (38-42 kcal/kg)  Protein:  >/= 128 grams (2.5 grams/kg)  Fluid:  >/= 1.8 L/day     Jarome Matin, MS, RD, LDN, [facility] Inpatient Clinical Dietitian Pager # [phone] After hours/weekend pager # [phone]

## 2018-10-16 NOTE — Progress Notes (Signed)
HEMATOLOGY-ONCOLOGY PROGRESS NOTE  DOS 10/15/2018  SUBJECTIVE:   [name] was seen in the medical ICU for hematology follow-up late evening of 10/15/2018.  Her husband and mother were at bedside.  She has now been extubated and is on 2 L of oxygen.  Platelets slightly better at 16k.  We discussed that her soluble interleukin-2 receptor assays were elevated confirming concerns for secondary Leonard.  Discussed use of Nplate to try to boost her platelets prior to consideration of adding reduced dose etoposide.  Family and patient were agreeable to restarting Nplate and would like to hold off on starting etoposide for the next few days.    REVIEW OF SYSTEMS:   Unable to obtain review of systems secondary to patient condition.  I have reviewed the past medical history, past surgical history, social history and family history with the patient and they are unchanged from previous note.   PHYSICAL EXAMINATION:  Vitals:   10/16/18 1000 10/16/18 1003  BP:  (!) 88/49  Pulse: (!) 124 (!) 121  Resp: (!) 27 (!) 29  Temp: 99.3 F (37.4 C) 99.3 F (37.4 C)  SpO2: 98% 97%   Filed Weights   10/14/18 0402 10/15/18 0347 10/16/18 0420  Weight: 114 lb 3.2 oz (51.8 kg) 109 lb 5.6 oz (49.6 kg) 111 lb 12.4 oz (50.7 kg)  .Temp (48hrs), Avg:98.7 F (37.1 C), Min:97.7 F (36.5 C), Max:99.5 F (37.5 C)    Intake/Output from previous day: 09/08 0701 - 09/09 0700 In: 1280 [I.V.:230; NG/GT:1050] Out: 3486 [Urine:15; Stool:50]  GENERAL: Intubated, sedated LUNGS: Coarse breath sounds in the anterior lung fields HEART: regular rate & rhythm and no murmurs and no lower extremity edema ABDOMEN:abdomen soft, non-tender and normal bowel sounds Musculoskeletal:no cyanosis of digits and no clubbing  NEURO: Sedated  LABORATORY DATA:  I have reviewed the data as listed CMP Latest Ref Rng & Units 10/15/2018 10/15/2018  Glucose 70 - 99 mg/dL 177(H) 102(H)  BUN 6 - 20 mg/dL 27(H) 27(H)  Creatinine 0.44 - 1.00  mg/dL 1.04(H) 1.06(H)  Sodium 135 - 145 mmol/L 136 137  Potassium 3.5 - 5.1 mmol/L 3.8 3.9  Chloride 98 - 111 mmol/L 101 102  CO2 22 - 32 mmol/L 25 25  Calcium 8.9 - 10.3 mg/dL 7.9(L) 7.7(L)  Total Protein 6.5 - 8.1 g/dL - -  Total Bilirubin 0.3 - 1.2 mg/dL - -  Alkaline Phos 38 - 126 U/L - -  AST 15 - 41 U/L - -  ALT 0 - 44 U/L - -   . CBC Latest Ref Rng & Units 10/15/2018 10/14/2018  WBC 4.0 - 10.5 K/uL 25.3(H) 21.0(H)  Hemoglobin 12.0 - 15.0 g/dL 8.1(L) 8.8(L)  Hematocrit 36.0 - 46.0 % 25.0(L) 26.4(L)  Platelets 150 - 400 K/uL 16(LL) 13(LL)         Component     Latest Ref Rng & Units 10/07/2018  EBV DNA QN by PCR     Negative copies/mL 1,033  log10 EBV DNA Qn PCR     log10 copy/mL 3.014  CMV Quant DNA PCR (Plasma)     Negative IU/mL 144,000  Log10 CMV Qn DNA Pl     log10 IU/mL 5.158  Interleukin-6, Plasma     0.0 - 12.2 pg/mL 771.6 (H)   [facility] test code [id number]   [facility] test name SARS COV2 AB,IGG   Comment: Performed at [facility], [place] [address] [place], [place] [address]  Misc [facility] result COMMENT   Comment: (NOTE)  Test Ordered: [id number] SARS-CoV-2 Antibody, IgG  SARS-CoV-2 Antibody, IgG    Negative        [facility] test name INTERLEUKIN 2 SOLUBLE RECEPTOR A   Comment: Performed at [facility], [place] [address], [place], [place] [address]  Misc [facility] result COMMENT   Comment: (NOTE)  Test Ordered: [id number] IL-2 Receptor Alpha  IL-2 Receptor Alpha      6069    [facility] ] U/mL   BN    Reference Range: [id number]        Dg Chest 2 View  Result Date: 10/05/2018 CLINICAL DATA:  Suspected sepsis EXAM: CHEST - 2 VIEW COMPARISON:  10/02/2018, 10/01/2018, 09/19/2018, 05/28/2018, 06/03/2017, 03/11/2013, 10/15/2017, 07/12/2018 FINDINGS: Small right-sided pleural effusion. Underlying reticular interstitial changes suggesting component of chronic disease, however there is interim worsening of bilateral right  greater than left airspace disease, suspicious for acute edema or pneumonia superimposed on chronic underlying disease. Stable cardiomediastinal silhouette. No pneumothorax. IMPRESSION: 1. Interval worsening of bilateral right greater than left airspace disease, suspicious for acute edema or pneumonia superimposed on underlying chronic disease. Small right-sided pleural effusion. Electronically Signed   By: Donavan Foil M.D.   On: 10/05/2018 23:52   Dg Chest 2 View  Result Date: 09/19/2018 CLINICAL DATA:  Hypotensive, recent pneumonia EXAM: CHEST - 2 VIEW COMPARISON:  Chest radiograph 08/26/2018 FINDINGS: Heart size within normal limits. Ill-defined and subtly nodular pulmonary opacities are again demonstrated, more prominent within the right lung. Findings are similar to prior chest radiograph 09/03/2018 and suspicious for infection. No evidence of pneumothorax or pleural effusion. No acute bony abnormality. IMPRESSION: Ill-defined and subtly nodular pulmonary opacities are similar to prior exam 08/26/2018 and suspicious for infection, suspected atypical pneumonia. Electronically Signed   By: Kellie Simmering   On: 09/19/2018 13:33   Dg Abd 1 View  Result Date: 10/15/2018 CLINICAL DATA:  Check feeding tube placement EXAM: ABDOMEN - 1 VIEW COMPARISON:  10/07/2018 FINDINGS: Nasogastric catheter has been removed. A weighted feeding catheter is now seen within the mid stomach. Scattered large and small bowel gas is noted. No bony abnormality is seen. IMPRESSION: Feeding catheter within the mid stomach. Electronically Signed   By: Inez Catalina M.D.   On: 10/15/2018 14:05   Dg Abd 1 View  Result Date: 10/07/2018 CLINICAL DATA:  Abdominal distention. OG tube placement. EXAM: ABDOMEN - 1 VIEW COMPARISON:  CT scan of the abdomen dated 08/02/2018 FINDINGS: There is gaseous distention of the stomach. The tip of the OG tube lies in the distal stomach. No dilated large or small bowel. Hepatosplenomegaly. Infiltrates at  the lung bases. Bones are normal. IMPRESSION: OG tube tip lies in the distal stomach. Gaseous distention of the stomach. Electronically Signed   By: Lorriane Shire M.D.   On: 10/07/2018 09:58   Ct Head Wo Contrast  Result Date: 10/07/2018 CLINICAL DATA:  Altered mental status, intubation, HIV EXAM: CT HEAD WITHOUT CONTRAST TECHNIQUE: Contiguous axial images were obtained from the base of the skull through the vertex without intravenous contrast. COMPARISON:  None. FINDINGS: Brain: No evidence of acute infarction, hemorrhage, hydrocephalus, extra-axial collection or mass lesion/mass effect. Vascular: No hyperdense vessel or unexpected calcification. Skull: Normal. Negative for fracture or focal lesion. Sinuses/Orbits: No acute finding. Other: None. IMPRESSION: No acute intracranial pathology. Electronically Signed   By: Eddie Candle M.D.   On: 10/07/2018 16:28   US Renal  Result Date: 10/09/2018 CLINICAL DATA:  Acute kidney injury. EXAM: RENAL / URINARY TRACT ULTRASOUND COMPLETE  COMPARISON:  08/08/2018 FINDINGS: Right Kidney: Renal measurements: 11.2 x 4.4 x 5.8 cm = volume: 149 mL. Echogenicity is diffusely increased. No hydronephrosis. Left Kidney: Renal measurements: 12.4 x 5.5 x 4.6 cm = volume: 163 mL. Diffusely increased echogenicity without hydronephrosis. Bladder: Decompressed by Foley catheter. Note: Small to moderate bilateral pleural effusions noted. IMPRESSION: 1. Bilateral echogenic kidneys without hydronephrosis. Imaging features compatible with medical renal disease. 2. Bilateral pleural effusions. Electronically Signed   By: Misty Stanley M.D.   On: 10/09/2018 20:03   Dg Chest Port 1 View  Result Date: 10/15/2018 CLINICAL DATA:  Hypoxia. EXAM: PORTABLE CHEST 1 VIEW COMPARISON:  Radiograph of October 14, 2018. FINDINGS: The heart size and mediastinal contours are within normal limits. Endotracheal and nasogastric tubes have been removed. Bilateral internal jugular catheters are unchanged in  position. Stable bilateral bibasilar opacities are noted concerning for possible atelectasis or inflammation. The visualized skeletal structures are unremarkable. IMPRESSION: Endotracheal and nasogastric tubes have been removed. Stable bilateral lung opacities as described above. Electronically Signed   By: Marijo Conception M.D.   On: 10/15/2018 07:08   Dg Chest Port 1 View  Result Date: 10/14/2018 CLINICAL DATA:  resp failure EXAM: PORTABLE CHEST 1 VIEW COMPARISON:  10/13/2018 FINDINGS: Endotracheal tube is in place, tip 4.8 centimeters above the carina. RIGHT IJ central line tip overlies the superior vena cava. LEFT IJ central line tip overlies the superior vena cava. Patient is rotated towards the LEFT. Nasogastric tube tip is off the image. The heart is normal in size. Diffuse ground-glass opacities are identified throughout the lungs bilaterally without significant change. No new consolidation. IMPRESSION: Stable bilateral pulmonary infiltrates. Electronically Signed   By: Nolon Nations M.D.   On: 10/14/2018 09:03   Dg Chest Port 1 View  Result Date: 10/13/2018 CLINICAL DATA:  Acute kidney injury secondary to shock and hypoperfusion. Intubated patient. EXAM: PORTABLE CHEST 1 VIEW COMPARISON:  Single-view of the chest 10/12/2018 and 10/10/2018. FINDINGS: Support tubes and lines are unchanged and project in good position. Diffuse hazy bilateral airspace disease has improved. No pneumothorax or pleural fluid. Heart size is normal. IMPRESSION: No change in support apparatus. Improved bilateral airspace disease. Electronically Signed   By: Inge Rise M.D.   On: 10/13/2018 08:33   Dg Chest Port 1 View  Result Date: 10/12/2018 CLINICAL DATA:  Acute respiratory failure EXAM: PORTABLE CHEST 1 VIEW COMPARISON:  Two days ago FINDINGS: Endotracheal tube tip between the clavicular heads and carina. Bilateral central line in the IJ with tips at the SVC. The orogastric tube reaches the stomach. Diffuse hazy  opacification of the lungs but significantly improved aeration from prior. No visible pleural fluid or air leak. IMPRESSION: 1. Unremarkable hardware positioning. 2. Diffuse airspace disease that is improved from 2 days ago. Electronically Signed   By: Monte Fantasia M.D.   On: 10/12/2018 08:16   Dg Chest Port 1 View  Result Date: 10/10/2018 CLINICAL DATA:  ET positioning EXAM: PORTABLE CHEST 1 VIEW COMPARISON:  Radiograph October 08, 2018 FINDINGS: Endotracheal tube is positioned in the mid trachea approximately 4 cm from the carina right IJ catheter terminates at the superior cavoatrial junction. Left IJ dual lumen catheter terminates at the caval-brachiocephalic confluence. Transesophageal tube tip and side port distal to the GE junction. Pacer pad overlies the left chest. Persistent bilateral airspace opacities demonstrate some interval clearing in the upper lobes. Basilar opacities are similar to prior. Cardiomediastinal silhouette is largely obscured by overlying opacity. No visible pneumothorax  or discernible effusion. No acute osseous or soft tissue abnormality. IMPRESSION: 1. Satisfactory positioning of the lines and tubes, as above. 2. Persistent bilateral airspace opacities with some interval clearing in the upper lobes. Electronically Signed   By: Lovena Le M.D.   On: 10/10/2018 05:27   Dg Chest Port 1 View  Result Date: 10/09/2018 CLINICAL DATA:  ARDS EXAM: PORTABLE CHEST 1 VIEW COMPARISON:  Radiograph 10/08/2018 FINDINGS: Pacer pads overlie the chest. Right IJ catheter tip terminates at the level of the superior cavoatrial junction. A left IJ dual lumen catheter tip terminates near the brachiocephalic-caval confluence. Endotracheal tube is positioned in the mid trachea, 3.8 cm from the carina. A transesophageal tube tip and side port are distal to the GE junction. There are diffuse consolidative opacities throughout both lungs on a background of more hazy interstitial opacity. Suspect small  right effusion. No pneumothorax. Cardiac silhouette is largely obscured by the overlying opacities. IMPRESSION: Extensive bilateral airspace opacities, similar in extent to most recent comparison. Stable, satisfactory positioning of lines and tubes, as above Electronically Signed   By: Lovena Le M.D.   On: 10/09/2018 05:40   Dg Chest Port 1 View  Result Date: 10/08/2018 CLINICAL DATA:  CVL placement EXAM: PORTABLE CHEST 1 VIEW COMPARISON:  10/08/2018, 10/07/2018, 10/05/2018 FINDINGS: Endotracheal tube tip is about 4.3 cm superior to the carina. Esophageal tube tip below the diaphragm but non included. Right IJ central venous catheter tip over the SVC. Left IJ central venous catheter tip over the upper SVC. No left pneumothorax. Extensive bilateral interstitial and alveolar disease. Obscured cardiomediastinal silhouette. Probable small effusions. IMPRESSION: 1. New left IJ central venous catheter with tip over the SVC. No pneumothorax. Support lines and tubes otherwise grossly stable in position 2. No significant change in extensive interstitial and alveolar disease bilaterally. Electronically Signed   By: Donavan Foil M.D.   On: 10/08/2018 14:06   Dg Chest Port 1 View  Result Date: 10/08/2018 CLINICAL DATA:  Check endotracheal tube placement EXAM: PORTABLE CHEST 1 VIEW COMPARISON:  10/07/2018 FINDINGS: Cardiac shadow is stable. Right jugular central line, gastric catheter and endotracheal tube are again seen. The endotracheal tube now lies 0.6 cm above the carina slightly withdrawn when compared with the prior exam. Persistent bilateral airspace opacities are noted worst in the right lung base stable from the previous day. Small right-sided effusion is noted superiorly. Bony abnormality is noted. IMPRESSION: Tubes and lines as described above. Stable airspace opacities bilaterally. Electronically Signed   By: Inez Catalina M.D.   On: 10/08/2018 08:43   Portable Chest X-ray  Result Date:  10/07/2018 CLINICAL DATA:  Intubation EXAM: PORTABLE CHEST 1 VIEW COMPARISON:  October 06, 2018 FINDINGS: ET tube is 3 cm above the level of carina. Enteric tube is seen coursing below the diaphragm. A right-sided PICC is seen at the superior cavoatrial junction. Patchy airspace opacities are seen throughout both lungs, right greater than left. The cardiomediastinal silhouette is unchanged. IMPRESSION: ET tube 3 cm above the carina. Unchanged patchy airspace opacities, right greater than left. Electronically Signed   By: Prudencio Pair M.D.   On: 10/07/2018 10:05   Dg Chest Port 1 View  Result Date: 10/06/2018 CLINICAL DATA:  33 year old female with centralized placement. EXAM: PORTABLE CHEST 1 VIEW COMPARISON:  Chest radiograph dated 10/05/2018 FINDINGS: Right IJ central venous line with tip over the cavoatrial junction. No pneumothorax. Bilateral airspace opacities similar to prior radiograph. There is a small right pleural effusion. No acute osseous  pathology. IMPRESSION: 1. Interval placement of a right IJ central venous line with tip in the region of the cavoatrial junction. No pneumothorax. 2. No significant interval change in the bilateral airspace opacities. Electronically Signed   By: Anner Crete M.D.   On: 10/06/2018 04:04   Dg Chest Port 1 View  Result Date: 10/02/2018 CLINICAL DATA:  Tachypnea EXAM: PORTABLE CHEST 1 VIEW COMPARISON:  Yesterday FINDINGS: Generalized reticular pulmonary opacity, present on multiple prior studies. No pleural fluid or pneumothorax. Normal heart size. IMPRESSION: Chronic reticular pulmonary opacity without acute superimposed finding. Electronically Signed   By: Monte Fantasia M.D.   On: 10/02/2018 05:45   Dg Chest Port 1 View  Result Date: 10/01/2018 CLINICAL DATA:  Cough, pancytopenia EXAM: PORTABLE CHEST 1 VIEW COMPARISON:  09/19/2018 FINDINGS: Stable cardiomediastinal contours. Extensive, diffuse reticulonodular opacities throughout both lungs, markedly  progressed from prior. No pleural effusion. No pneumothorax. Osseous structures intact. IMPRESSION: Extensive reticulonodular opacities throughout both lungs suggestive of an atypical infection in an immunocompromised patient. Electronically Signed   By: Davina Poke M.D.   On: 10/01/2018 18:38   Vas [place] Lower Extremity Venous (dvt)  Result Date: 10/07/2018  Lower Venous Study Indications: SOB, and Edema.  Risk Factors: Advanced HIV/AIDS. Limitations: Lights on in room, patient newly vented. Comparison Study: Prior study from 06/20/17 is available for comparison Performing Technologist: Sharion Dove RVS  Examination Guidelines: A complete evaluation includes B-mode imaging, spectral Doppler, color Doppler, and power Doppler as needed of all accessible portions of each vessel. Bilateral testing is considered an integral part of a complete examination. Limited examinations for reoccurring indications may be performed as noted.  +---------+---------------+---------+-----------+----------+--------------+  RIGHT     Compressibility Phasicity Spontaneity Properties Thrombus Aging  +---------+---------------+---------+-----------+----------+--------------+  CFV       Full            Yes       Yes                                    +---------+---------------+---------+-----------+----------+--------------+  SFJ       Full                                                             +---------+---------------+---------+-----------+----------+--------------+  FV Prox   Full                                                             +---------+---------------+---------+-----------+----------+--------------+  FV Mid    Full                                                             +---------+---------------+---------+-----------+----------+--------------+  FV Distal Full                                                             +---------+---------------+---------+-----------+----------+--------------+  PFV        Full                                                             +---------+---------------+---------+-----------+----------+--------------+  POP       Full            Yes       Yes                                    +---------+---------------+---------+-----------+----------+--------------+  PTV       Full                                                             +---------+---------------+---------+-----------+----------+--------------+  PERO      Full                                                             +---------+---------------+---------+-----------+----------+--------------+   +---------+---------------+---------+-----------+----------+--------------+  LEFT      Compressibility Phasicity Spontaneity Properties Thrombus Aging  +---------+---------------+---------+-----------+----------+--------------+  CFV       Full            Yes       Yes                                    +---------+---------------+---------+-----------+----------+--------------+  SFJ       Full                                                             +---------+---------------+---------+-----------+----------+--------------+  FV Prox   Full                                                             +---------+---------------+---------+-----------+----------+--------------+  FV Mid    Full                                                             +---------+---------------+---------+-----------+----------+--------------+  FV Distal Full                                                             +---------+---------------+---------+-----------+----------+--------------+  PFV       Full                                                             +---------+---------------+---------+-----------+----------+--------------+  POP       Full            Yes       Yes                                    +---------+---------------+---------+-----------+----------+--------------+  PTV       Full                                                              +---------+---------------+---------+-----------+----------+--------------+  PERO                                                       Not visualized  +---------+---------------+---------+-----------+----------+--------------+     Summary: Right: Findings appear essentially unchanged compared to previous examination. There is no evidence of deep vein thrombosis in the lower extremity. Left: Findings appear essentially unchanged compared to previous examination. There is no evidence of deep vein thrombosis in the lower extremity. However, portions of this examination were limited- see technologist comments above.  *See table(s) above for measurements and observations. Electronically signed by Monica Martinez MD on 10/07/2018 at 6:03:03 PM.    Final    Vas [place] Upper Extremity Venous Duplex  Result Date: 10/15/2018 UPPER VENOUS STUDY  Indications: Edema Limitations: Bandages and Edema and size of the veins. Comparison Study: No previous study available Performing Technologist: Toma Copier RVS  Examination Guidelines: A complete evaluation includes B-mode imaging, spectral Doppler, color Doppler, and power Doppler as needed of all accessible portions of each vessel. Bilateral testing is considered an integral part of a complete examination. Limited examinations for reoccurring indications may be performed as noted.  Right Findings: +----------+------------+---------+-----------+----------+---------------------+  RIGHT      Compressible Phasicity Spontaneous Properties        Summary         +----------+------------+---------+-----------+----------+---------------------+  IJV                                                      Not visualized due to                                                               dialysis access     +----------+------------+---------+-----------+----------+---------------------+  Subclavian     Full  Yes        Yes                                        +----------+------------+---------+-----------+----------+---------------------+  Axillary       Full        Yes        Yes                                       +----------+------------+---------+-----------+----------+---------------------+  Brachial       Full        Yes        Yes                                       +----------+------------+---------+-----------+----------+---------------------+  Radial         Full                                                             +----------+------------+---------+-----------+----------+---------------------+  Ulnar          Full                                                             +----------+------------+---------+-----------+----------+---------------------+  Cephalic                                                 Not visualized due to                                                            edema and size of the                                                                    vein           +----------+------------+---------+-----------+----------+---------------------+  Basilic                                                  Not visualized due to  the size of the vein   +----------+------------+---------+-----------+----------+---------------------+ Technically limited due to dialysis access in the neck, size and edema.  Left Findings: +----------+------------+---------+-----------+----------+---------------------+  LEFT       Compressible Phasicity Spontaneous Properties        Summary         +----------+------------+---------+-----------+----------+---------------------+  IJV                                                      Not visualized due to                                                                IV placement       +----------+------------+---------+-----------+----------+---------------------+  Subclavian                                               Not visualized due  to                                                               bandages for IV                                                                     placement        +----------+------------+---------+-----------+----------+---------------------+  Axillary       Full        Yes        Yes                                       +----------+------------+---------+-----------+----------+---------------------+  Brachial       Full        Yes        Yes                                       +----------+------------+---------+-----------+----------+---------------------+  Radial         Full                                                             +----------+------------+---------+-----------+----------+---------------------+  Ulnar          Full                                                             +----------+------------+---------+-----------+----------+---------------------+  Cephalic                                                 Not visualize due to                                                                size and edema      +----------+------------+---------+-----------+----------+---------------------+  Basilic                                                  Not visualized due to                                                               size and edema      +----------+------------+---------+-----------+----------+---------------------+ Technically limited due to bandages, IV placement, size. and edema  Summary:  Right: No evidence of deep vein thrombosis in the upper extremity. However, unable to visualize all veins. See comments listed above. No evidence of superficial vein thrombosis . However, unable to visualize See comments listed above. No evidence of thrombosis in the subclavian. This was a limited study.  Left: No evidence of deep vein thrombosis in the upper extremity. However, unable to visualize all veins. See comments listed avone. No evidence of superficial vein thrombosis .  However, unable to visualize all veins. See comments listed avone. Unable to visualize the subclavian/ See comments listed abone. This was a limited study.  *See table(s) above for measurements and observations.  Diagnosing physician: Servando Snare MD Electronically signed by Servando Snare MD on 10/15/2018 at 1:15:33 PM.    Final    09/11/2018 Bone Marrow Biopsy      09/11/2018 [employer]   ASSESSMENT AND PLAN:  This is a 33 year old unfortunate African-American female with a very complex medical history and course with HIV AIDS CD4 count less than 50 on ART with recurrent admissions for sepsis and possible PJP with  1. Anemia-transfusion dependent 2. Thrombocytopenia-significant thrombocytopenia currently transfusion dependent 3.  Leukocytosis-with elevated procalcitonin levels and significantly elevated interleukin-6 level suggesting severe inflammation. 4.  PJP recently, recurrent sepsis, etiology of pulmonary infiltrates unclear 5.  HIV/AIDS on Biktarvy. On Atovaquone for PCP prophylaxis and Azithromycin for MAI prophylaxis. 6. Splenomegaly and some lymphadenopathy noted on previous imaging  7.  Acute respiratory failure currently intubated and on a mechanical ventilator. 8. Concern for secondary [place]  Patient with pancytopenia with extensive workup including 2 BM Bx's . Patients blood counts havent had a chance for sustained improvement on account of recurrent sepsis, medication causing BM suppression and cytopenia, HIV/AIDS related dysplastic changes on last BM Bx. She has had improvement in her condition with previous antibiotic treatments and brief use of steroids and PJP treatment. Bone marrow examination has not shown overt infectious etiology, was negative for MAI or  other fungal processes. No overt evidence of lymphoma in the bone marrow biopsy. Lymphadenopathy and splenomegaly could be from her HIV AIDS as well as recurrent infections. Currently cultures remain negative and  patient is on empiric treatment for healthcare acquired pneumonia.  Significantly elevated interleukin-6 levels suggest severe inflammatory state.  This could be from recurrent infections or macrophage activation syndrome related to HIV. Also has elevated ferritin level in the setting of significant transfusion history and from acute inflammation. Triglyceride levels have fluctuated the last ones are elevated. LDH levels are elevated but this could be in the setting of alternative pulmonary process. She has had very poor nutritional status on account of recurrent admissions sepsis AIDS and other considerations.  This can also play into her bone marrow suppression.  She has had increasing blood CMV titers which infectious disease is aware of but do not feel this is the primary driver of her lung findings or bone marrow suppression.  They have also sent out parvovirus testing to evaluate for this is a sign of bone marrow suppression.  Patient did not respond to IVIG previously to suggest immune cytopenias.  Plan -Patient is now extubated and is doing clinically better. -Still requiring CRRT for fluid removal -Husband and mother updated at bedside. -Her platelets have improved some to 16k -Discussed and restarted the patient on weekly Nplate at 5 mcg/kg to help boost her platelets to reduce platelet transfusion needs and to potentially provide some safety margin for the possible use of etoposide. -Continue current dose of dexamethasone will start weaning off in 7 days -Discussed elevated soluble interleukin-2 receptor levels.  These are certainly also additional concern for secondary [place] though not definitely elevated more than 10,000.  If more than 10,000 this would be even more specific.  However the patient will has been on steroids and that could have potentially had a bearing on these levels as well. -Discussed the use of low-dose etoposide and the risks and benefits.  Patient and family would  like to wait for a few more days to evaluate her clinical course and platelets prior to restarting this. -They would also like to discuss with the MICU team if transfer to a tertiary care facility is feasible given her condition.  This transfer will need to be at an ICU to ICU level since her primary hematology oncology service will likely not accept a direct transfer. -We shall continue to follow -Appreciate excellent pulmonary and critical care services  Total time spent 35 minutes more than 50% of the time on direct patient evaluation and discussion with patient's husband.

## 2018-10-16 NOTE — Progress Notes (Signed)
Patient ID: Krystal Short, female   DOB: 09-16-1985, 33 y.o.   MRN: [id number] [facility] Progress Note   Assessment/ Plan:   1. Acute kidney Injury: Suspected to be secondary to ATN from septic shock.  On CRRT and without any evident renal recovery yet-anuric.  Suspect dense ATN versus cortical necrosis based on her lack of recovery so far.  With her current labs/volume status/hypotension, will discontinue CRRT this afternoon and monitor labs.  Okay to remove her Foley catheter and follow with bladder scan every morning. 2.  Acute/recurrent hypoxemic respiratory failure: With bronchoalveolar lavage showing Staphylococcus epidermidis for which he is on antimicrobial coverage.  Testing for PJP negative. 3.  Thrombocytopenia/anemia: Ongoing work-up including bone marrow biopsy recently undertaken.  Additional management per hematology.  Anemia likely exacerbated by acute/critical illness. 4.  Hyperphosphatemia: Secondary to CRRT losses/GI losses, will replace intravenously  Subjective:   Some hypotension earlier today requiring discontinuation of UF on CRRT as well as saline bolus.  Remains off pressors.   Objective:   BP 112/66   Pulse (!) 119   Temp 98.6 F (37 C)   Resp (!) 26   Ht _0  (1.549 m)   Wt 50.7 kg   LMP  (LMP Unknown) Comment: pt is on depo  SpO2 98%   BMI 21.12 kg/m   Intake/Output Summary (Last 24 hours) at 10/16/2018 1304 Last data filed at 10/16/2018 1200 Gross per 24 hour  Intake 2097.5 ml  Output 3529 ml  Net -1431.5 ml   Weight change: 1.1 kg  Physical Exam: Gen: Resting comfortably in bed overnight, on CRRT. CVS: Pulse regular tachycardia, S1 and S2 with ejection systolic murmur Resp: Coarse breath sounds bilaterally without distinct rales or rhonchi Abd: Soft, flat, nontender Ext: Trace-1+ edema over both legs  Imaging: Dg Abd 1 View  Result Date: 10/15/2018 CLINICAL DATA:  Check feeding tube placement EXAM: ABDOMEN - 1 VIEW COMPARISON:   10/07/2018 FINDINGS: Nasogastric catheter has been removed. A weighted feeding catheter is now seen within the mid stomach. Scattered large and small bowel gas is noted. No bony abnormality is seen. IMPRESSION: Feeding catheter within the mid stomach. Electronically Signed   By: Inez Catalina M.D.   On: 10/15/2018 14:05   Dg Chest Port 1 View  Result Date: 10/15/2018 CLINICAL DATA:  Hypoxia. EXAM: PORTABLE CHEST 1 VIEW COMPARISON:  Radiograph of October 14, 2018. FINDINGS: The heart size and mediastinal contours are within normal limits. Endotracheal and nasogastric tubes have been removed. Bilateral internal jugular catheters are unchanged in position. Stable bilateral bibasilar opacities are noted concerning for possible atelectasis or inflammation. The visualized skeletal structures are unremarkable. IMPRESSION: Endotracheal and nasogastric tubes have been removed. Stable bilateral lung opacities as described above. Electronically Signed   By: Marijo Conception M.D.   On: 10/15/2018 07:08    Labs: BMET Recent Labs  Lab 10/13/18 0343 10/13/18 0939 10/13/18 1716 10/14/18 0404 10/14/18 1708 10/15/18 0330 10/15/18 1732 10/16/18 0350  NA 137 137 136 136 136 137  137 136 134*  K 3.6 3.4* 4.2 3.7 4.2 3.9  4.0 3.8 4.2  CL 100 104 102 101 100 102  103 101 101  CO2 _1 GLUCOSE 168* 187* 270* 162* 282* 102*  100* 177* 173*  BUN 33* 31* 37* 35* 35* 27*  28* 27* 31*  CREATININE 0.98 0.96 0.99 0.95 1.00 1.06*  1.13* 1.04* 1.09*  CALCIUM 7.5* 7.6* 7.1*  7.6* 7.4* 7.7*  7.6* 7.9* 7.7*  PHOS 2.2*  2.3*  --  3.6 2.4*  2.5 9.6* 3.6  3.6 2.3* 2.4*   CBC Recent Labs  Lab 10/11/18 0455  10/12/18 0407 10/13/18 0343 10/14/18 0404 10/15/18 0330 10/16/18 0725  WBC 15.0*  --  17.8* 18.6* 21.0* 25.3* 16.8*  NEUTROABS 12.6*  --  13.1*  --   --   --  13.4*  HGB 7.9*  --  7.6* 6.6* 8.8* 8.1* 7.8*  HCT 23.9*  --  23.3* 20.1* 26.4* 25.0* 24.6*  MCV 90.5  --  92.1 93.5 91.7  93.6 96.5  PLT 7*   < > 11* 10* 13* 16* 21*   < > = values in this interval not displayed.    Medications:    . atovaquone  1,500 mg Oral Daily  . azithromycin  1,200 mg Oral Q Tue  . benzonatate  100 mg Oral TID  . chlorhexidine  15 mL Mouth Rinse BID  . chlorhexidine gluconate (MEDLINE KIT)  15 mL Mouth Rinse BID  . Chlorhexidine Gluconate Cloth  6 each Topical Daily  . emtricitabine-tenofovir AF  1 tablet Per Tube Daily   And  . dolutegravir  50 mg Oral Daily  . feeding supplement (PRO-STAT SUGAR FREE 64)  30 mL Per Tube QID  . guaiFENesin-dextromethorphan  7.5 mL Per Tube Q6H  . insulin aspart  2-6 Units Subcutaneous Q4H  . lidocaine  1 patch Transdermal Q24H  . mouth rinse  15 mL Mouth Rinse 10 times per day  . mometasone-formoterol  2 puff Inhalation BID  . pantoprazole sodium  40 mg Per Tube Daily  . romiPLOStim  5 mcg/kg Subcutaneous Weekly  . sodium chloride flush  10-40 mL Intracatheter Q12H   Elmarie Shiley, MD 10/16/2018, 1:04 PM

## 2018-10-16 NOTE — Progress Notes (Signed)
PROGRESS NOTE    Krystal Short  RCV:[id number] DOB: 1985/11/15 DOA: 10/05/2018 PCP: Lajean Manes, MD   Brief Narrative:  Patient is an unfortunate 33 year old female history of HIV/AIDS, chronic anemia/thrombocytopenia requiring multiple transfusions in the past, who was admitted on 10/05/2018 with progressive shortness of breath and acute recurrent hypoxemic respiratory failure.  Patient noted have been treated for PGP pneumonia, bacterial pneumonia and with steroids in the past.  Patient noted to have some bone marrow suppression as well as platelets of just 9 which trended down from 16 early on in the month.  Patient underwent a bone marrow biopsy, BAL.  Patient decompensated during the hospitalization and on 10/07/2018 required initiation of mechanical ventilation with pressure support requiring pressors and CRRT.  Patient noted to be having metabolic acidosis, acute kidney injury and subsequently anuric.  Nephrology consulted, ID consulted and are following.  Patient on PCCM service.  Patient extubated 10/14/2018 and patient transferred to hospitalist service.  PCCM following along.    Recent Evaluation  03/08/18-03/10/18 Acute respiratory failure secondary to pneumonia (rhinovirus). tx with rocephin/ zithromax, Bactrim for PCP. Macrocytic anemia   04/22/2018- Flu A infection. Treated with tamiflu as outpatient  05/28/18- 05/31/18 admitted with respiratory failure, lung infiltrates.  Treated with Bactrim, prednisone with improvement  06/14/2018-07/16/2018 Bordetella bronchoseptica infection. Underwent FOB 07/12/2018. PJP test not done as BAL was lost.Biopsy scant lung parenchyma with fibrosis and reactive changes. Required 4 PRBC and 3 platelet infusion.  07/31/2018-08/22/2018 sepsis from suspected PJP pneumonia. Treated with steroids clindamycin and primaquine. Required 8 PRBC and 7 platelet transfusion. RVP positive for routine coronavirus.  09/03/2018-09/13/2018 generalized weakness and  deconditioning. Required 3 PRBC 8 platelet transfusion.  7/30/2020IVIGwithout any improvement in platelet count. Given prolonged prednisone taper.  09/11/18 Bone marrowBx all micro studies neg   10/01/2018-10/04/2018 presented with anemia and poor IV access. Given 2 PRBC 1 platelet transfusion. Required pressors. Treated with ceftriaxone and doxy for ? CAP.   Assessment & Plan:   Active Problems:   HIV disease ([place])   Molluscum contagiosum   Symptomatic anemia   Thrombocytopenia (HCC)   ASCUS with positive high risk HPV cervical   Pulmonary infiltrates   Lymphadenopathy, generalized   Acute respiratory failure with hypoxemia (HCC)   AKI (acute kidney injury) (HCC)   Elevated bilirubin   Anemia   Elevated liver enzymes   AIDS (acquired immune deficiency syndrome) (HCC)   ARDS (adult respiratory distress syndrome) (HCC)   Alba (hemophagocytic lymphohistiocytosis) ([place])   Compensated metabolic acidosis   History of ETT   Acute metabolic encephalopathy   Hypophosphatemia   Protein-calorie malnutrition, moderate (HCC)   1 acute recurrent respiratory failure with hypoxemia/recurrent bilateral pulmonary infiltrates Questionable etiology.  Felt to be secondary to acute lung injury/hypersensitivity pneumonitis with questionable etiology.  Patient noted to have recurrent bilateral pulmonary infiltrates.  Patient intubated and recently extubated on 10/14/2018.  BAL done positive for staph epidermidis.  PGP was negative.  Blood cultures with no growth to date.  Patient received a days worth of Bactrim.  Received IV Maxipime from 10/05/2018>>> 10/12/2018.  Patient now on oral azithromycin per ID recommendations.  Patient on 3 L nasal cannula.  BiPAP as needed.  Patient being followed by ID.  Pulmonary following.  2.  Profound persistent thrombocytopenia/anemia Hemoglobin currently at 7.8.  Platelet count at 21,000.  Patient noted to have had extensive work-up by heme-onc with bone marrow  biopsy with a normal bone marrow.  HIT antibody was negative.  M AHA negative.  Patient initially started on steroids for possible sarcoidosis but no granulomas noted on prior biopsies.  Patient is which have been tapered off.  Patient status post transfusion of 6 units of platelets and 3 units of packed red blood cells.  Transfusion threshold hemoglobin less than 7 and platelet count less than 20,000.  Patient to be restarted on weekly Nplate to help boost her platelets per hematology.  Patient was on Decadron 20 mg daily which ended on 10/14/2018.  Will defer resumption of steroids to hematology and PCCM. Follow.  3.  Acute kidney injury/metabolic acidosis/hyperkalemia/hypocalcemia Felt secondary to ATN from septic shock.  Patient anuric.  Patient with soft borderline blood pressures however currently off pressors.  Patient on CRRT.  Nephrology following and planning to discontinue CRRT in the next 24 to 48 hours and consider transitioning to IHD.  Per nephrology.  4.  Hypophosphatemia Felt secondary to CRRT losses/GI losses.  Per nephrology.  5.  HIV/AIDS On Biktarvy.  Continue atovaquone for PCP prophylaxis and azithromycin for MAI prophylaxis.  Per ID.  6.  Concern for secondary [facility] Per hematology and PCCM.  7.  Acute metabolic encephalopathy In the setting of advanced HIV/AIDS acute respiratory failure and acute kidney injury with profound anemia and thrombocytopenia.  Head CT negative.  Improved.  8.  Moderate protein calorie malnutrition Patient started on tube feeds.  9.  Transaminitis Slowly improving.   DVT prophylaxis: Prevalon boots Code Status: Full Family Communication: Updated patient.  No family at bedside. Disposition Plan: Remain in the ICU.   Consultants:  PCCM 8/30 ID 8/30 Hem/Onc 8/30 Nephrology 9/1   Procedures:  R IJ CVC 8/30 >> ETT 8/31 >>10/14/2018 FOB 8/31 >> L HD cath 9/1 >>  Significant Diagnostic Tests:  Head CT 8/31 >> negative for any acute  intracranial process Renal ultrasound 9/2 >> No hydronephrosis, medical renal disease  Chest x-ray 9/6-improving pulmonary infiltrates.   Significant Hospital Events   8/29 Admit  8/30 PCCM consulted,started solumedrol 8/30 am @ 80 mg q 12h 8/31 Intubated, FOB 9/1 CRRT 9/4 High dose decadron 20 mg IV qd started by heme due to concern of secondary [place]. Send soluble CD 25/ IL2Ra levels 9/5 Weaning pressors, starting PSV weans, improving CXR 9/7 - Awake on the vent.  No acute events. Weaning on pressure support    Antimicrobials:  Bactrim 8/29 >>8/30 Maxepime 8/29 >> 9/5 PO Atovaquone 8/30 >> PO Azithromycin 9/1 >>   Subjective: Patient laying in bed.  Patient denies chest pain.  Patient denies any shortness of breath.  Patient on CRRT.  Objective: Vitals:   10/16/18 1003 10/16/18 1009 10/16/18 1030 10/16/18 1031  BP: (!) 88/49 (!) 88/54 (!) 82/49 (!) 80/53  Pulse: (!) 121 (!) 127 (!) 125 (!) 124  Resp: (!) 29 (!) 27 (!) 30 (!) 30  Temp: 99.3 F (37.4 C)  99.5 F (37.5 C) 99.5 F (37.5 C)  TempSrc:      SpO2: 97% 96% 95% 96%  Weight:      Height:        Intake/Output Summary (Last 24 hours) at 10/16/2018 1041 Last data filed at 10/16/2018 1000 Gross per 24 hour  Intake 1630 ml  Output 3661 ml  Net -2031 ml   Filed Weights   10/14/18 0402 10/15/18 0347 10/16/18 0420  Weight: 51.8 kg 49.6 kg 50.7 kg    Examination:  General exam: Appears calm and comfortable.  Frail.  Panda tube in. Respiratory system: Clear to auscultation anterior lung fields.  No wheezes, no crackles, no rhonchi.Marland Kitchen Respiratory effort normal. Cardiovascular system: Tachycardic.  No JVD, no murmurs, no rubs, no gallops.  No lower extremity edema.  Gastrointestinal system: Abdomen is nondistended, soft and nontender. No organomegaly or masses felt. Normal bowel sounds heard. Central nervous system: Alert and oriented. No focal neurological deficits. Extremities: Right fifth toe on the base  blackened.  Pulses noted per Doppler.  Prevalon boots. Skin: No rashes, lesions or ulcers Psychiatry: Judgement and insight appear normal. Mood & affect appropriate.     Data Reviewed: I have personally reviewed following labs and imaging studies  CBC: Recent Labs  Lab 10/11/18 0455  10/12/18 0407 10/13/18 0343 10/14/18 0404 10/15/18 0330 10/16/18 0725  WBC 15.0*  --  17.8* 18.6* 21.0* 25.3* 16.8*  NEUTROABS 12.6*  --  13.1*  --   --   --  13.4*  HGB 7.9*  --  7.6* 6.6* 8.8* 8.1* 7.8*  HCT 23.9*  --  23.3* 20.1* 26.4* 25.0* 24.6*  MCV 90.5  --  92.1 93.5 91.7 93.6 96.5  PLT 7*   < > 11* 10* 13* 16* 21*   < > = values in this interval not displayed.   Basic Metabolic Panel: Recent Labs  Lab 10/12/18 0407  10/13/18 0343  10/14/18 0404 10/14/18 1708 10/15/18 0330 10/15/18 1732 10/16/18 0350  NA 137   < > 137   < > 136 136 137   137 136 134*  K 3.4*   < > 3.6   < > 3.7 4.2 3.9   4.0 3.8 4.2  CL 102   < > 100   < > 101 100 102   103 101 101  CO2 25   < > 24   < > _0 GLUCOSE 191*   < > 168*   < > 162* 282* 102*   100* 177* 173*  BUN 27*   < > 33*   < > 35* 35* 27*   28* 27* 31*  CREATININE 0.90   < > 0.98   < > 0.95 1.00 1.06*   1.13* 1.04* 1.09*  CALCIUM 7.5*   < > 7.5*   < > 7.6* 7.4* 7.7*   7.6* 7.9* 7.7*  MG 2.5*  --  2.3  --  2.4  --  2.4  --  2.5*  PHOS 1.3*   < > 2.2*   2.3*   < > 2.4*   2.5 9.6* 3.6   3.6 2.3* 2.4*   < > = values in this interval not displayed.   GFR: Estimated Creatinine Clearance: 56.4 mL/min (A) (by C-G formula based on SCr of 1.09 mg/dL (H)). Liver Function Tests: Recent Labs  Lab 10/11/18 1227  10/12/18 0910  10/13/18 0939  10/14/18 0404 10/14/18 1708 10/15/18 0330 10/15/18 1732 10/16/18 0350  AST 56*  --  57*  --  59*  --   --   --   --   --  58*  ALT 21  --  25  --  32  --   --   --   --   --  35  ALKPHOS 374*  --  406*  --  442*  --   --   --   --   --  370*  BILITOT 3.8*  --  3.4*  --  3.4*  --   --   --   --    --  2.5*  PROT 5.7*  --  5.5*  --  6.0*  --   --   --   --   --  6.8  ALBUMIN 1.9*   < > 1.8*   < > 2.1*   < > 2.1* 2.1* 2.4* 2.3* 2.4*   2.5*   < > = values in this interval not displayed.   No results for input(s): LIPASE, AMYLASE in the last 168 hours. No results for input(s): AMMONIA in the last 168 hours. Coagulation Profile: Recent Labs  Lab 10/11/18 1227  INR 1.3*   Cardiac Enzymes: No results for input(s): CKTOTAL, CKMB, CKMBINDEX, TROPONINI in the last 168 hours. BNP (last 3 results) No results for input(s): PROBNP in the last 8760 hours. HbA1C: No results for input(s): HGBA1C in the last 72 hours. CBG: Recent Labs  Lab 10/15/18 0731 10/15/18 0829 10/15/18 1243 10/15/18 1642 10/15/18 1955  GLUCAP 61* 92 93 126* 117*   Lipid Profile: Recent Labs    10/14/18 1708  TRIG 286*   Thyroid Function Tests: No results for input(s): TSH, T4TOTAL, FREET4, T3FREE, THYROIDAB in the last 72 hours. Anemia Panel: Recent Labs    10/14/18 1708  FERRITIN >7,500*   Sepsis Labs: No results for input(s): PROCALCITON, LATICACIDVEN in the last 168 hours.  Recent Results (from the past 240 hour(s))  Culture, bal-quantitative     Status: Abnormal   Collection Time: 10/07/18 10:16 AM   Specimen: Bronchoalveolar Lavage; Respiratory  Result Value Ref Range Status   Specimen Description   Final    BRONCHIAL ALVEOLAR LAVAGE Performed at [facility] [address] [place], [place] [address]    Special Requests   Final    Immunocompromised Performed at [facility], [place] [address] [place], [place] [address]    Gram Stain   Final    FEW SQUAMOUS EPITHELIAL CELLS PRESENT MODERATE WBC PRESENT, PREDOMINANTLY MONONUCLEAR FEW GRAM POSITIVE COCCI Performed at [facility], [place] [address] [place], [place] [address]    Culture 50,000 COLONIES/mL STAPHYLOCOCCUS EPIDERMIDIS (A)  Final   Report Status 10/10/2018 FINAL  Final   Organism  ID, Bacteria STAPHYLOCOCCUS EPIDERMIDIS (A)  Final      Susceptibility   Staphylococcus epidermidis - MIC*    CIPROFLOXACIN 4 RESISTANT Resistant     ERYTHROMYCIN >=8 RESISTANT Resistant     GENTAMICIN 4 SENSITIVE Sensitive     OXACILLIN >=4 RESISTANT Resistant     TETRACYCLINE >=16 RESISTANT Resistant     VANCOMYCIN 1 SENSITIVE Sensitive     TRIMETH/SULFA 80 RESISTANT Resistant     CLINDAMYCIN >=8 RESISTANT Resistant     RIFAMPIN <=0.5 SENSITIVE Sensitive     Inducible Clindamycin NEGATIVE Sensitive     * 50,000 COLONIES/mL STAPHYLOCOCCUS EPIDERMIDIS  Pneumocystis smear by DFA     Status: None   Collection Time: 10/07/18 10:16 AM   Specimen: Bronchial Alveolar Lavage; Respiratory  Result Value Ref Range Status   Specimen Source-PJSRC BRONCHIAL ALVEOLAR LAVAGE  Final   Pneumocystis jiroveci Ag NEGATIVE  Final    Comment: Performed at [facility] Performed at [facility] [address], [place], [place] [address]          Radiology Studies: Dg Abd 1 View  Result Date: 10/15/2018 CLINICAL DATA:  Check feeding tube placement EXAM: ABDOMEN - 1 VIEW COMPARISON:  10/07/2018 FINDINGS: Nasogastric catheter has been removed. A weighted feeding catheter is now seen within the mid stomach. Scattered  large and small bowel gas is noted. No bony abnormality is seen. IMPRESSION: Feeding catheter within the mid stomach. Electronically Signed   By: Inez Catalina M.D.   On: 10/15/2018 14:05   Dg Chest Port 1 View  Result Date: 10/15/2018 CLINICAL DATA:  Hypoxia. EXAM: PORTABLE CHEST 1 VIEW COMPARISON:  Radiograph of October 14, 2018. FINDINGS: The heart size and mediastinal contours are within normal limits. Endotracheal and nasogastric tubes have been removed. Bilateral internal jugular catheters are unchanged in position. Stable bilateral bibasilar opacities are noted concerning for possible atelectasis or inflammation. The visualized skeletal structures are  unremarkable. IMPRESSION: Endotracheal and nasogastric tubes have been removed. Stable bilateral lung opacities as described above. Electronically Signed   By: Marijo Conception M.D.   On: 10/15/2018 07:08   Vas [place] Upper Extremity Venous Duplex  Result Date: 10/15/2018 UPPER VENOUS STUDY  Indications: Edema Limitations: Bandages and Edema and size of the veins. Comparison Study: No previous study available Performing Technologist: Toma Copier RVS  Examination Guidelines: A complete evaluation includes B-mode imaging, spectral Doppler, color Doppler, and power Doppler as needed of all accessible portions of each vessel. Bilateral testing is considered an integral part of a complete examination. Limited examinations for reoccurring indications may be performed as noted.  Right Findings: +----------+------------+---------+-----------+----------+---------------------+  RIGHT      Compressible Phasicity Spontaneous Properties        Summary         +----------+------------+---------+-----------+----------+---------------------+  IJV                                                      Not visualized due to                                                               dialysis access     +----------+------------+---------+-----------+----------+---------------------+  Subclavian     Full        Yes        Yes                                       +----------+------------+---------+-----------+----------+---------------------+  Axillary       Full        Yes        Yes                                       +----------+------------+---------+-----------+----------+---------------------+  Brachial       Full        Yes        Yes                                       +----------+------------+---------+-----------+----------+---------------------+  Radial         Full                                                             +----------+------------+---------+-----------+----------+---------------------+  Ulnar           Full                                                             +----------+------------+---------+-----------+----------+---------------------+  Cephalic                                                 Not visualized due to                                                            edema and size of the                                                                    vein           +----------+------------+---------+-----------+----------+---------------------+  Basilic                                                  Not visualized due to                                                            the size of the vein   +----------+------------+---------+-----------+----------+---------------------+ Technically limited due to dialysis access in the neck, size and edema.  Left Findings: +----------+------------+---------+-----------+----------+---------------------+  LEFT       Compressible Phasicity Spontaneous Properties        Summary         +----------+------------+---------+-----------+----------+---------------------+  IJV                                                      Not visualized due to                                                                IV placement       +----------+------------+---------+-----------+----------+---------------------+  Subclavian  Not visualized due to                                                               bandages for IV                                                                     placement        +----------+------------+---------+-----------+----------+---------------------+  Axillary       Full        Yes        Yes                                       +----------+------------+---------+-----------+----------+---------------------+  Brachial       Full        Yes        Yes                                       +----------+------------+---------+-----------+----------+---------------------+  Radial          Full                                                             +----------+------------+---------+-----------+----------+---------------------+  Ulnar          Full                                                             +----------+------------+---------+-----------+----------+---------------------+  Cephalic                                                 Not visualize due to                                                                size and edema      +----------+------------+---------+-----------+----------+---------------------+  Basilic                                                  Not visualized due  to                                                               size and edema      +----------+------------+---------+-----------+----------+---------------------+ Technically limited due to bandages, IV placement, size. and edema  Summary:  Right: No evidence of deep vein thrombosis in the upper extremity. However, unable to visualize all veins. See comments listed above. No evidence of superficial vein thrombosis . However, unable to visualize See comments listed above. No evidence of thrombosis in the subclavian. This was a limited study.  Left: No evidence of deep vein thrombosis in the upper extremity. However, unable to visualize all veins. See comments listed avone. No evidence of superficial vein thrombosis . However, unable to visualize all veins. See comments listed avone. Unable to visualize the subclavian/ See comments listed abone. This was a limited study.  *See table(s) above for measurements and observations.  Diagnosing physician: Servando Snare MD Electronically signed by Servando Snare MD on 10/15/2018 at 1:15:33 PM.    Final         Scheduled Meds:  atovaquone  1,500 mg Oral Daily   azithromycin  1,200 mg Oral Q Tue   benzonatate  100 mg Oral TID   chlorhexidine  15 mL Mouth Rinse BID   chlorhexidine gluconate (MEDLINE KIT)  15 mL Mouth Rinse BID    Chlorhexidine Gluconate Cloth  6 each Topical Daily   emtricitabine-tenofovir AF  1 tablet Per Tube Daily   And   dolutegravir  50 mg Oral Daily   feeding supplement (PRO-STAT SUGAR FREE 64)  30 mL Per Tube BID   guaiFENesin-dextromethorphan  7.5 mL Per Tube Q6H   insulin aspart  2-6 Units Subcutaneous Q4H   lidocaine  1 patch Transdermal Q24H   mouth rinse  15 mL Mouth Rinse 10 times per day   mometasone-formoterol  2 puff Inhalation BID   pantoprazole sodium  40 mg Per Tube Daily   romiPLOStim  5 mcg/kg Subcutaneous Weekly   sodium chloride flush  10-40 mL Intracatheter Q12H   Continuous Infusions:   prismasol BGK 4/2.5 400 mL/hr at 10/16/18 0030    prismasol BGK 4/2.5 200 mL/hr at 10/15/18 2330   sodium chloride 10 mL/hr at 10/11/18 1737   sodium chloride     anticoagulant sodium citrate     feeding supplement (VITAL HIGH PROTEIN) 1,000 mL (10/15/18 1512)   prismasol BGK 4/2.5 1,000 mL/hr at 10/16/18 0744   sodium chloride Stopped (10/16/18 1239)     LOS: 10 days    Time spent: 45 minutes    Irine Seal, MD [facility]  If 7PM-7AM, please contact night-coverage [email or web address] 10/16/2018, 10:41 AM

## 2018-10-16 NOTE — Progress Notes (Signed)
SLP Cancellation Note  Patient Details Name: Krystal Short MRN: [id number] DOB: 10/23/1985   Cancelled treatment:       Reason Eval/Treat Not Completed: Other (comment);Fatigue/lethargy limiting ability to participate(patient currently sleeping, mom sleeping rubbing pt's hand, note pt has small bore feeding tube at this time; RN reports pt has not had ice chips today - she also states RN last night was not comfortable providing the pt ice chips. rec MBS when ready)   Macario Golds 10/16/2018, 1:52 PM  Luanna Salk, [facility] SLP Acute Rehab Services Pager [phone] Office [phone]

## 2018-10-16 NOTE — Progress Notes (Signed)
NAMEKenneth Short, MRN:  [id number], DOB:  12/15/1985, LOS: 96 ADMISSION DATE:  10/05/2018, CONSULTATION DATE:  10/06/2018 REFERRING MD:  Dr. Craig Guess, CHIEF COMPLAINT:  SOB   Brief History   Krystal Short is a 33 yo F with history of HIV/AIDS admitted on 8/29 with progressive shortness of breath. History of multiple admissions over the last year for waxing / waning pulmonary infiltrates. Unfortunately patient decompensated on 8/31 requiring initiation of mechanical intubation and later pressure support and CRRT.    Ferritin consistently hight - Results for Krystal Short, Krystal Short (MRN [id number]) as of 10/16/2018 09:40  Ref. Range 05/30/2018 09:04 07/04/2018 14:15 08/02/2018 11:24 09/05/2018 06:40 10/02/2018 11:12 10/11/2018 12:27 10/13/2018 03:43 10/14/2018 17:08  Ferritin Latest Ref Range: 11 - 307 ng/mL 603 (H) 1,106 (H) 5,223 (H) 4,590 (H) 9,379 (H) >7,500 (H) >7,500 (H) >7,500 (H)    Recent Evaluation  03/08/18-03/10/18 Acute respiratory failure secondary to pneumonia (rhinovirus). tx with rocephin/ zithromax, Bactrim for PCP. Macrocytic anemia   04/22/2018- Flu A infection. Treated with tamiflu as outpatient  05/28/18- 05/31/18 admitted with respiratory failure, lung infiltrates.  Treated with Bactrim, prednisone with improvement  06/14/2018-07/16/2018 Bordetella bronchoseptica infection. Underwent FOB 07/12/2018. PJP test not done as BAL was lost.Biopsy scant lung parenchyma with fibrosis and reactive changes. Required 4 PRBC and 3 platelet infusion. Bm bx -> with hemophagotycosis. June 2020 CT abd - with mild hepatosplenomegaly  07/31/2018-08/22/2018 sepsis from suspected PJP pneumonia. Treated with steroids clindamycin and primaquine. Required 8 PRBC and 7 platelet transfusion. RVP positive for routine coronavirus.  09/03/2018-09/13/2018 generalized weakness and deconditioning. Required 3 PRBC 8 platelet transfusion.  7/30/2020IVIGwithout any improvement in platelet count. Given prolonged prednisone  taper.  09/11/18 Bone marrowBx all micro studies neg   10/01/2018-10/04/2018 presented with anemia and poor IV access. Given 2 PRBC 1 platelet transfusion. Required pressors. Treated with ceftriaxone and doxy for ? CAP.  Significant Hospital Events   8/29 Admit  8/30 PCCM consulted,started solumedrol 8/30 am @ 80 mg q 12h 8/31 Intubated, FOB 9/1 CRRT  9/4 High dose decadron 20 mg IV qd started by heme due to concern of secondary [place]. Send soluble CD 25/ IL2Ra levels - returned on 9/9 as high > 6000 9/5 Weaning pressors, starting PSV weans, improving CXR 9/7 - Awake on the vent.  No acute events. Weaning on pressure support 8/5 yesterday 9/8 -  extubated yesterda. Needed bipap lat night for resp "spell". Now on [place] and doing well. Denies vaping . Admits to using DOWN PILLOW (feather) At home x 7 months - new. However, denies  any organic antigen (mold, birds, mildew, humidifer) but also wanted to confirm with husband. Called husband but did not pick up. SLP pending - cough was  Hoarse and weak at bedside. ANuric and on CRRT  Consults:  PCCM 8/30 ID 8/30 Hem/Onc 8/30 Nephrology 9/1  Procedures:  R IJ CVC 8/30 >> ETT 8/31 >> FOB 8/31 >> L HD cath 9/1 >>  Significant Diagnostic Tests:  Head CT 8/31 >> negative for any acute intracranial process Renal ultrasound 9/2 >> No hydronephrosis, medical renal disease   Chest x-ray 9/6-improving pulmonary infiltrates.  Personally reviewed  Micro Data:  COVID 19 pcr 8/29>>neg SARS COV2 IgG 9/5- neg  BCx 2 8/29 >>no growth 2 days PCP8/31>>neg 9/1 FOB BAL 8/31 >> 50,000 Coag neg staph, PJP negative  Antimicrobials:  Bactrim 8/29 >>8/30 Maxepime 8/29 >> 9/5 PO Atovaquone 8/30 >> PO Azithromycin 9/1 >>  Interim history/subjective:    9/9  -  down to 2L [place]. Per RN =SPB runing soft and MAP barely 65. ANuric. On negative balance with CRRT. Also, each night cRRT filter clots she gets more hypoxemic. Per Dr Vaughan Browner - soluble  IL2RA +++ > 2SD (http://saintantoine.aphp.fr/score/ -> H score 261 -> 99.6% prob for [place])    Objective   Blood pressure 99/61, pulse (!) 124, temperature 99.3 F (37.4 C), resp. rate (!) 30, height _0  (1.549 m), weight 50.7 kg, SpO2 93 %.    Vent Mode: BIPAP FiO2 (%):  [30 %-40 %] 30 % Set Rate:  [14 bmp] 14 bmp PEEP:  [8 cmH20] 8 cmH20   Intake/Output Summary (Last 24 hours) at 10/16/2018 0954 Last data filed at 10/16/2018 0900 Gross per 24 hour  Intake 1380 ml  Output 3634 ml  Net -2254 ml   Filed Weights   10/14/18 0402 10/15/18 0347 10/16/18 0420  Weight: 51.8 kg 49.6 kg 50.7 kg     General Appearance:  Looks emacitated, but stable Head:  Normocephalic, without obvious abnormality, atraumatic, ? Molluscum on face Eyes:  PERRL - yes, conjunctiva/corneas - muddy     Ears:  Normal external ear canals, both ears Nose:  G tube - no bu has nasal cannula 02 Throat:  ETT TUBE - no , OG tube - no Neck:  Supple,  No enlargement/tenderness/nodules Lungs: Clear to auscultation bilaterally, Heart:  S1 and S2 normal, no murmur, CVP - no.  Pressors - n Abdomen:  Soft, no masses, no organomegaly Genitalia / Rectal:  Not done Extremities:  Extremities- intact Skin:  ntact in exposed areas . Sacral area - not examined Neurologic:  Sedation - none -> RASS - +1 . Moves all 4s - yes. CAM-ICU - neg . Orientation - x3+       Resolved Hospital Problem list   CirculatoryShock -likely some component of sedation, +/- sepsis  P: Weaned off pressors  Assessment & Plan:  AcuteRecurrentHypoxemic Respiratory Failure--In the setting of advanced HIV/AIDS now with Staph Epid seen on BAL with bloody secretion . Marland Kitchen High concern for [place] (hemophagocytic lymphohistiocytosis)  - ? Triggered by down feather pillow recurrent hypersensitivity pneumonitis .  Extubated 10/14/2018  10/16/2018 - Improved CXR and hypoxemia down to 2L [place]  P: O2 for pulse ox > 88% biPAP prn if needed Husband to discard  down pillows Get repeat CT chest     Acute encephalopathy > Improved -In the setting of advanced HIV/AIDS, respiratory and renal failure, and profound thrombocytopenia. Head CT neg for any acute abnormalities   10/16/2018 - resolved x 24h. 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 -> (http://saintantoine.aphp.fr/score/ -> H score 261 -> 99.6% prob for [place]) - ? Triggered by resp failure due to down pillow pneumonitis    - HIT ab negative, no MAHA.  - Started high dose decadron 10/11/2018 by heme     10/16/2018 -  Platelets slowly impriving  P: Formal heme consult - Dr Barbaraann Faster called - ? Role for steroids+ /- etoposide   Anemia of chronic and critical illness and [place]  - PRBC for hgb </= 6.9gm%    - exceptions are   -  if ACS susepcted/confirmed then transfuse for hgb </= 8.0gm%,  or    -  active bleeding with hemodynamic instability, then transfuse regardless of hemoglobin value   At at all times try to transfuse 1 unit prbc as possible with exception of active hemorrhage     AKI g  - 10/16/2018 - anuric and on CRRT. RN is asking if foley still needed . RN reporting soft BP but in negative balance.   P: Continue CRRT - per renal - might go to [facility]  Dr Posey Pronto to address housekeeping issue   HIV  HIV viral load undetectable but CD4 count remains low   P: Antibiotics and antiretroviral medication per ID.  Upper extremity edema left greater than right   - 10/14/2018  UE dvt negative  P: monitor   Malnutrition  - low albumin state   9/9 - failed  Swallow  Plan  per [facility]   Transaminitis  - might be feature of [place]  9/9 - improving  Plan  - repeat check as needed   Best practice:  Diet:TF via OG tube Pain/Anxiety/Delirium protocol (if indicated):none VAP protocol (if indicated):In place DVT prophylaxis:SCD's, allergy to heparin GI prophylaxis:PPI Glucose control:N/A Mobility:BR Code Status:Full Family Communication:Both husband and mom updated daily at bedside and continue to express desire for aggressive measures   - as of 9/.08/2018. On 10/15/2018 - calld husband but wen tto VM  Disposition:ICU but [facility] primary - PCCM will see prn   ATTESTATION & SIGNATURE    Dr. Brand Males, M.D., [facility].C.P Pulmonary and Critical Care Medicine Staff Physician [facility] Pager: [phone], If no answer or between  15:00h - 7:00h: call [phone]  10/16/2018 9:54 AM    LABS    PULMONARY Recent Labs  Lab 10/09/18 1528 10/10/18 0420 10/13/18 0430 10/14/18 2230  PHART 7.332* 7.393 7.490* 7.480*  PCO2ART 49.9* 43.2 34.3 35.2  PO2ART 50.9* 292* 99.6 467*  HCO3 25.7 26.0 25.9 25.9  O2SAT 81.9 99.6 98.4 100.0    CBC Recent Labs  Lab 10/14/18 0404 10/15/18 0330 10/16/18 0725  HGB 8.8* 8.1* 7.8*  HCT 26.4* 25.0* 24.6*  WBC 21.0* 25.3* 16.8*  PLT 13* 16* 21*    COAGULATION Recent Labs  Lab 10/11/18 1227  INR 1.3*    CARDIAC  No results for input(s): TROPONINI in the last 168 hours. No results for input(s): PROBNP in the last 168 hours.   CHEMISTRY Recent Labs  Lab 10/12/18 0407  10/13/18 0343  10/14/18 0404 10/14/18 1708 10/15/18 0330 10/15/18 1732 10/16/18 0350  NA 137   < > 137   < > 136 136 137  137 136 134*  K 3.4*   < > 3.6   < > 3.7 4.2 3.9  4.0 3.8 4.2  CL 102   < > 100   < > 101 100 102  103 101 101  CO2 25   < > 24   < > _0 GLUCOSE 191*   < > 168*   < > 162* 282* 102*  100* 177* 173*  BUN 27*   < > 33*   < > 35* 35* 27*  28* 27* 31*  CREATININE 0.90   < > 0.98   < > 0.95 1.00 1.06*  1.13* 1.04* 1.09*  CALCIUM 7.5*   < > 7.5*    < > 7.6* 7.4* 7.7*  7.6* 7.9* 7.7*  MG 2.5*  --  2.3  --  2.4  --  2.4  --  2.5*  PHOS 1.3*   < > 2.2*  2.3*   < > 2.4*  2.5 9.6* 3.6  3.6 2.3* 2.4*   < > = values in this interval not displayed.   Estimated Creatinine Clearance: 56.4 mL/min (A) (by C-G formula based on SCr of 1.09 mg/dL (H)).   LIVER Recent Labs  Lab 10/11/18 1227  10/12/18 0910  10/13/18 0939  10/14/18 0404 10/14/18 1708 10/15/18 0330 10/15/18 1732 10/16/18 0350  AST 56*  --  57*  --  59*  --   --   --   --   --  58*  ALT 21  --  25  --  32  --   --   --   --   --  35  ALKPHOS 374*  --  406*  --  442*  --   --   --   --   --  370*  BILITOT 3.8*  --  3.4*  --  3.4*  --   --   --   --   --  2.5*  PROT 5.7*  --  5.5*  --  6.0*  --   --   --   --   --  6.8  ALBUMIN 1.9*   < > 1.8*   < > 2.1*   < > 2.1* 2.1* 2.4* 2.3* 2.4*  2.5*  INR 1.3*  --   --   --   --   --   --   --   --   --   --    < > = values in this interval not displayed.     INFECTIOUS No results for input(s): LATICACIDVEN, PROCALCITON in the last 168 hours.   ENDOCRINE CBG (last 3)  Recent Labs    10/15/18 1243 10/15/18 1642 10/15/18 1955  GLUCAP 93 126* 117*         IMAGING x48h  - image(s) personally visualized  -   highlighted in bold Dg Abd 1 View  Result Date: 10/15/2018 CLINICAL DATA:  Check feeding tube placement EXAM: ABDOMEN - 1 VIEW COMPARISON:  10/07/2018 FINDINGS: Nasogastric catheter has been removed. A weighted feeding catheter is now seen within the mid stomach. Scattered large and small bowel gas is noted. No bony abnormality is seen. IMPRESSION: Feeding catheter within the mid stomach. Electronically Signed   By: Inez Catalina M.D.   On: 10/15/2018 14:05   Dg Chest Port 1 View  Result Date: 10/15/2018 CLINICAL DATA:  Hypoxia. EXAM: PORTABLE CHEST 1 VIEW COMPARISON:  Radiograph of October 14, 2018. FINDINGS: The heart size and mediastinal contours are within normal limits. Endotracheal and nasogastric tubes have  been removed. Bilateral internal jugular catheters are unchanged in position. Stable bilateral bibasilar opacities are noted concerning for possible atelectasis or inflammation. The visualized skeletal structures are unremarkable. IMPRESSION: Endotracheal and nasogastric tubes have been removed. Stable bilateral lung opacities as described above. Electronically Signed   By: Marijo Conception M.D.   On: 10/15/2018 07:08   Vas [place] Upper Extremity Venous Duplex  Result Date: 10/15/2018 UPPER VENOUS STUDY  Indications: Edema Limitations: Bandages and Edema and size of the veins. Comparison Study: No previous study available Performing Technologist: Toma Copier RVS  Examination Guidelines: A complete evaluation includes B-mode imaging, spectral Doppler, color Doppler, and power Doppler as needed of all accessible portions of each vessel. Bilateral testing is considered an integral part of a complete examination. Limited examinations for reoccurring indications may be  performed as noted.  Right Findings: +----------+------------+---------+-----------+----------+---------------------+ RIGHT     CompressiblePhasicitySpontaneousProperties       Summary        +----------+------------+---------+-----------+----------+---------------------+ IJV                                                 Not visualized due to                                                        dialysis access    +----------+------------+---------+-----------+----------+---------------------+ Subclavian    Full       Yes       Yes                                    +----------+------------+---------+-----------+----------+---------------------+ Axillary      Full       Yes       Yes                                    +----------+------------+---------+-----------+----------+---------------------+ Brachial      Full       Yes       Yes                                     +----------+------------+---------+-----------+----------+---------------------+ Radial        Full                                                        +----------+------------+---------+-----------+----------+---------------------+ Ulnar         Full                                                        +----------+------------+---------+-----------+----------+---------------------+ Cephalic                                            Not visualized due to                                                     edema and size of the  vein          +----------+------------+---------+-----------+----------+---------------------+ Basilic                                             Not visualized due to                                                     the size of the vein  +----------+------------+---------+-----------+----------+---------------------+ Technically limited due to dialysis access in the neck, size and edema.  Left Findings: +----------+------------+---------+-----------+----------+---------------------+ LEFT      CompressiblePhasicitySpontaneousProperties       Summary        +----------+------------+---------+-----------+----------+---------------------+ IJV                                                 Not visualized due to                                                         IV placement      +----------+------------+---------+-----------+----------+---------------------+ Subclavian                                          Not visualized due to                                                        bandages for IV                                                              placement       +----------+------------+---------+-----------+----------+---------------------+ Axillary      Full       Yes       Yes                                     +----------+------------+---------+-----------+----------+---------------------+ Brachial      Full       Yes       Yes                                    +----------+------------+---------+-----------+----------+---------------------+ Radial        Full                                                        +----------+------------+---------+-----------+----------+---------------------+  Ulnar         Full                                                        +----------+------------+---------+-----------+----------+---------------------+ Cephalic                                            Not visualize due to                                                         size and edema     +----------+------------+---------+-----------+----------+---------------------+ Basilic                                             Not visualized due to                                                        size and edema     +----------+------------+---------+-----------+----------+---------------------+ Technically limited due to bandages, IV placement, size. and edema  Summary:  Right: No evidence of deep vein thrombosis in the upper extremity. However, unable to visualize all veins. See comments listed above. No evidence of superficial vein thrombosis . However, unable to visualize See comments listed above. No evidence of thrombosis in the subclavian. This was a limited study.  Left: No evidence of deep vein thrombosis in the upper extremity. However, unable to visualize all veins. See comments listed avone. No evidence of superficial vein thrombosis . However, unable to visualize all veins. See comments listed avone. Unable to visualize the subclavian/ See comments listed abone. This was a limited study.  *See table(s) above for measurements and observations.  Diagnosing physician: Servando Snare MD Electronically signed by Servando Snare MD on 10/15/2018 at 1:15:33 PM.    Final

## 2018-10-16 NOTE — Progress Notes (Signed)
Per Faythe Dingwall RN, patient given bath last night 10/15/2018.

## 2018-10-16 NOTE — Progress Notes (Addendum)
HEMATOLOGY-ONCOLOGY PROGRESS NOTE  SUBJECTIVE:   The patient's mother is at the bedside at the time of my visit.  Patient is resting quietly.  She remains on 2 L of oxygen.  She does not have any bleeding today.  The patient's mother does not report any other issues to me today.  REVIEW OF SYSTEMS:   Unable to obtain review of systems secondary to patient condition.  I have reviewed the past medical history, past surgical history, social history and family history with the patient and they are unchanged from previous note.   PHYSICAL EXAMINATION:  Vitals:   10/16/18 1430 10/16/18 1500  BP: (!) 106/53 (!) 102/49  Pulse: (!) 114 (!) 120  Resp: (!) 29 (!) 23  Temp:    SpO2: 99% 100%   Filed Weights   10/14/18 0402 10/15/18 0347 10/16/18 0420  Weight: 114 lb 3.2 oz (51.8 kg) 109 lb 5.6 oz (49.6 kg) 111 lb 12.4 oz (50.7 kg)  .Temp (48hrs), Avg:98.7 F (37.1 C), Min:97.7 F (36.5 C), Max:99.7 F (37.6 C)    Intake/Output from previous day: 09/08 0701 - 09/09 0700 In: 1280 [I.V.:230; NG/GT:1050] Out: 3486 [Urine:15; Stool:50]  GENERAL: Sleeping quietly, no distress LUNGS: Clear to auscultation  HEART: Tachycardic and no lower extremity edema ABDOMEN:abdomen soft, non-tender and normal bowel sounds Musculoskeletal:no cyanosis of digits and no clubbing  NEURO: Opens eyes  LABORATORY DATA:  I have reviewed the data as listed  . CBC Latest Ref Rng & Units 10/16/2018 10/15/2018 10/14/2018  WBC 4.0 - 10.5 K/uL 16.8(H) 25.3(H) 21.0(H)  Hemoglobin 12.0 - 15.0 g/dL 7.8(L) 8.1(L) 8.8(L)  Hematocrit 36.0 - 46.0 % 24.6(L) 25.0(L) 26.4(L)  Platelets 150 - 400 K/uL 21(LL) 16(LL) 13(LL)    . CMP Latest Ref Rng & Units 10/16/2018 10/15/2018 10/15/2018  Glucose 70 - 99 mg/dL 173(H) 177(H) 102(H)  BUN 6 - 20 mg/dL 31(H) 27(H) 27(H)  Creatinine 0.44 - 1.00 mg/dL 1.09(H) 1.04(H) 1.06(H)  Sodium 135 - 145 mmol/L 134(L) 136 137  Potassium 3.5 - 5.1 mmol/L 4.2 3.8 3.9  Chloride 98 - 111 mmol/L  101 101 102  CO2 22 - 32 mmol/L '26 25 25  '$ Calcium 8.9 - 10.3 mg/dL 7.7(L) 7.9(L) 7.7(L)  Total Protein 6.5 - 8.1 g/dL 6.8 - -  Total Bilirubin 0.3 - 1.2 mg/dL 2.5(H) - -  Alkaline Phos 38 - 126 U/L 370(H) - -  AST 15 - 41 U/L 58(H) - -  ALT 0 - 44 U/L 35 - -           Component     Latest Ref Rng & Units 10/07/2018  EBV DNA QN by PCR     Negative copies/mL 1,033  log10 EBV DNA Qn PCR     log10 copy/mL 3.014  CMV Quant DNA PCR (Plasma)     Negative IU/mL 144,000  Log10 CMV Qn DNA Pl     log10 IU/mL 5.158  Interleukin-6, Plasma     0.0 - 12.2 pg/mL 771.6 (H)   [facility] test code [id number]   [facility] test name SARS COV2 AB,IGG   Comment: Performed at [facility], [place] [address], [place], [place] [address]  Misc [facility] result COMMENT   Comment: (NOTE)  Test Ordered: [id number] SARS-CoV-2 Antibody, IgG  SARS-CoV-2 Antibody, IgG    Negative        [facility] test name INTERLEUKIN 2 SOLUBLE RECEPTOR A   Comment: Performed at [facility], [place] [address] [place],  [address]  Misc [facility] result COMMENT   Comment: (NOTE)  Test Ordered: [id number] IL-2 Receptor Alpha  IL-2 Receptor Alpha      6069    [facility] ] U/mL   BN    Reference Range: [id number]        Dg Chest 2 View  Result Date: 10/05/2018 CLINICAL DATA:  Suspected sepsis EXAM: CHEST - 2 VIEW COMPARISON:  10/02/2018, 10/01/2018, 09/19/2018, 05/28/2018, 06/03/2017, 03/11/2013, 10/15/2017, 07/12/2018 FINDINGS: Small right-sided pleural effusion. Underlying reticular interstitial changes suggesting component of chronic disease, however there is interim worsening of bilateral right greater than left airspace disease, suspicious for acute edema or pneumonia superimposed on chronic underlying disease. Stable cardiomediastinal silhouette. No pneumothorax. IMPRESSION: 1. Interval worsening of bilateral right greater than left airspace disease, suspicious for acute edema or  pneumonia superimposed on underlying chronic disease. Small right-sided pleural effusion. Electronically Signed   By: Donavan Foil M.D.   On: 10/05/2018 23:52   Dg Chest 2 View  Result Date: 09/19/2018 CLINICAL DATA:  Hypotensive, recent pneumonia EXAM: CHEST - 2 VIEW COMPARISON:  Chest radiograph 08/26/2018 FINDINGS: Heart size within normal limits. Ill-defined and subtly nodular pulmonary opacities are again demonstrated, more prominent within the right lung. Findings are similar to prior chest radiograph 09/03/2018 and suspicious for infection. No evidence of pneumothorax or pleural effusion. No acute bony abnormality. IMPRESSION: Ill-defined and subtly nodular pulmonary opacities are similar to prior exam 08/26/2018 and suspicious for infection, suspected atypical pneumonia. Electronically Signed   By: Kellie Simmering   On: 09/19/2018 13:33   Dg Abd 1 View  Result Date: 10/15/2018 CLINICAL DATA:  Check feeding tube placement EXAM: ABDOMEN - 1 VIEW COMPARISON:  10/07/2018 FINDINGS: Nasogastric catheter has been removed. A weighted feeding catheter is now seen within the mid stomach. Scattered large and small bowel gas is noted. No bony abnormality is seen. IMPRESSION: Feeding catheter within the mid stomach. Electronically Signed   By: Inez Catalina M.D.   On: 10/15/2018 14:05   Dg Abd 1 View  Result Date: 10/07/2018 CLINICAL DATA:  Abdominal distention. OG tube placement. EXAM: ABDOMEN - 1 VIEW COMPARISON:  CT scan of the abdomen dated 08/02/2018 FINDINGS: There is gaseous distention of the stomach. The tip of the OG tube lies in the distal stomach. No dilated large or small bowel. Hepatosplenomegaly. Infiltrates at the lung bases. Bones are normal. IMPRESSION: OG tube tip lies in the distal stomach. Gaseous distention of the stomach. Electronically Signed   By: Lorriane Shire M.D.   On: 10/07/2018 09:58   Ct Head Wo Contrast  Result Date: 10/07/2018 CLINICAL DATA:  Altered mental status, intubation,  HIV EXAM: CT HEAD WITHOUT CONTRAST TECHNIQUE: Contiguous axial images were obtained from the base of the skull through the vertex without intravenous contrast. COMPARISON:  None. FINDINGS: Brain: No evidence of acute infarction, hemorrhage, hydrocephalus, extra-axial collection or mass lesion/mass effect. Vascular: No hyperdense vessel or unexpected calcification. Skull: Normal. Negative for fracture or focal lesion. Sinuses/Orbits: No acute finding. Other: None. IMPRESSION: No acute intracranial pathology. Electronically Signed   By: Eddie Candle M.D.   On: 10/07/2018 16:28   US Renal  Result Date: 10/09/2018 CLINICAL DATA:  Acute kidney injury. EXAM: RENAL / URINARY TRACT ULTRASOUND COMPLETE COMPARISON:  08/08/2018 FINDINGS: Right Kidney: Renal measurements: 11.2 x 4.4 x 5.8 cm = volume: 149 mL. Echogenicity is diffusely increased. No hydronephrosis. Left Kidney: Renal measurements: 12.4 x 5.5 x 4.6 cm = volume: 163 mL. Diffusely increased echogenicity without hydronephrosis. Bladder: Decompressed by  Foley catheter. Note: Small to moderate bilateral pleural effusions noted. IMPRESSION: 1. Bilateral echogenic kidneys without hydronephrosis. Imaging features compatible with medical renal disease. 2. Bilateral pleural effusions. Electronically Signed   By: Misty Stanley M.D.   On: 10/09/2018 20:03   Dg Chest Port 1 View  Result Date: 10/15/2018 CLINICAL DATA:  Hypoxia. EXAM: PORTABLE CHEST 1 VIEW COMPARISON:  Radiograph of October 14, 2018. FINDINGS: The heart size and mediastinal contours are within normal limits. Endotracheal and nasogastric tubes have been removed. Bilateral internal jugular catheters are unchanged in position. Stable bilateral bibasilar opacities are noted concerning for possible atelectasis or inflammation. The visualized skeletal structures are unremarkable. IMPRESSION: Endotracheal and nasogastric tubes have been removed. Stable bilateral lung opacities as described above.  Electronically Signed   By: Marijo Conception M.D.   On: 10/15/2018 07:08   Dg Chest Port 1 View  Result Date: 10/14/2018 CLINICAL DATA:  resp failure EXAM: PORTABLE CHEST 1 VIEW COMPARISON:  10/13/2018 FINDINGS: Endotracheal tube is in place, tip 4.8 centimeters above the carina. RIGHT IJ central line tip overlies the superior vena cava. LEFT IJ central line tip overlies the superior vena cava. Patient is rotated towards the LEFT. Nasogastric tube tip is off the image. The heart is normal in size. Diffuse ground-glass opacities are identified throughout the lungs bilaterally without significant change. No new consolidation. IMPRESSION: Stable bilateral pulmonary infiltrates. Electronically Signed   By: Nolon Nations M.D.   On: 10/14/2018 09:03   Dg Chest Port 1 View  Result Date: 10/13/2018 CLINICAL DATA:  Acute kidney injury secondary to shock and hypoperfusion. Intubated patient. EXAM: PORTABLE CHEST 1 VIEW COMPARISON:  Single-view of the chest 10/12/2018 and 10/10/2018. FINDINGS: Support tubes and lines are unchanged and project in good position. Diffuse hazy bilateral airspace disease has improved. No pneumothorax or pleural fluid. Heart size is normal. IMPRESSION: No change in support apparatus. Improved bilateral airspace disease. Electronically Signed   By: Inge Rise M.D.   On: 10/13/2018 08:33   Dg Chest Port 1 View  Result Date: 10/12/2018 CLINICAL DATA:  Acute respiratory failure EXAM: PORTABLE CHEST 1 VIEW COMPARISON:  Two days ago FINDINGS: Endotracheal tube tip between the clavicular heads and carina. Bilateral central line in the IJ with tips at the SVC. The orogastric tube reaches the stomach. Diffuse hazy opacification of the lungs but significantly improved aeration from prior. No visible pleural fluid or air leak. IMPRESSION: 1. Unremarkable hardware positioning. 2. Diffuse airspace disease that is improved from 2 days ago. Electronically Signed   By: Monte Fantasia M.D.   On:  10/12/2018 08:16   Dg Chest Port 1 View  Result Date: 10/10/2018 CLINICAL DATA:  ET positioning EXAM: PORTABLE CHEST 1 VIEW COMPARISON:  Radiograph October 08, 2018 FINDINGS: Endotracheal tube is positioned in the mid trachea approximately 4 cm from the carina right IJ catheter terminates at the superior cavoatrial junction. Left IJ dual lumen catheter terminates at the caval-brachiocephalic confluence. Transesophageal tube tip and side port distal to the GE junction. Pacer pad overlies the left chest. Persistent bilateral airspace opacities demonstrate some interval clearing in the upper lobes. Basilar opacities are similar to prior. Cardiomediastinal silhouette is largely obscured by overlying opacity. No visible pneumothorax or discernible effusion. No acute osseous or soft tissue abnormality. IMPRESSION: 1. Satisfactory positioning of the lines and tubes, as above. 2. Persistent bilateral airspace opacities with some interval clearing in the upper lobes. Electronically Signed   By: Lovena Le M.D.   On:  10/10/2018 05:27   Dg Chest Port 1 View  Result Date: 10/09/2018 CLINICAL DATA:  ARDS EXAM: PORTABLE CHEST 1 VIEW COMPARISON:  Radiograph 10/08/2018 FINDINGS: Pacer pads overlie the chest. Right IJ catheter tip terminates at the level of the superior cavoatrial junction. A left IJ dual lumen catheter tip terminates near the brachiocephalic-caval confluence. Endotracheal tube is positioned in the mid trachea, 3.8 cm from the carina. A transesophageal tube tip and side port are distal to the GE junction. There are diffuse consolidative opacities throughout both lungs on a background of more hazy interstitial opacity. Suspect small right effusion. No pneumothorax. Cardiac silhouette is largely obscured by the overlying opacities. IMPRESSION: Extensive bilateral airspace opacities, similar in extent to most recent comparison. Stable, satisfactory positioning of lines and tubes, as above Electronically Signed    By: Lovena Le M.D.   On: 10/09/2018 05:40   Dg Chest Port 1 View  Result Date: 10/08/2018 CLINICAL DATA:  CVL placement EXAM: PORTABLE CHEST 1 VIEW COMPARISON:  10/08/2018, 10/07/2018, 10/05/2018 FINDINGS: Endotracheal tube tip is about 4.3 cm superior to the carina. Esophageal tube tip below the diaphragm but non included. Right IJ central venous catheter tip over the SVC. Left IJ central venous catheter tip over the upper SVC. No left pneumothorax. Extensive bilateral interstitial and alveolar disease. Obscured cardiomediastinal silhouette. Probable small effusions. IMPRESSION: 1. New left IJ central venous catheter with tip over the SVC. No pneumothorax. Support lines and tubes otherwise grossly stable in position 2. No significant change in extensive interstitial and alveolar disease bilaterally. Electronically Signed   By: Donavan Foil M.D.   On: 10/08/2018 14:06   Dg Chest Port 1 View  Result Date: 10/08/2018 CLINICAL DATA:  Check endotracheal tube placement EXAM: PORTABLE CHEST 1 VIEW COMPARISON:  10/07/2018 FINDINGS: Cardiac shadow is stable. Right jugular central line, gastric catheter and endotracheal tube are again seen. The endotracheal tube now lies 0.6 cm above the carina slightly withdrawn when compared with the prior exam. Persistent bilateral airspace opacities are noted worst in the right lung base stable from the previous day. Small right-sided effusion is noted superiorly. Bony abnormality is noted. IMPRESSION: Tubes and lines as described above. Stable airspace opacities bilaterally. Electronically Signed   By: Inez Catalina M.D.   On: 10/08/2018 08:43   Portable Chest X-ray  Result Date: 10/07/2018 CLINICAL DATA:  Intubation EXAM: PORTABLE CHEST 1 VIEW COMPARISON:  October 06, 2018 FINDINGS: ET tube is 3 cm above the level of carina. Enteric tube is seen coursing below the diaphragm. A right-sided PICC is seen at the superior cavoatrial junction. Patchy airspace opacities are seen  throughout both lungs, right greater than left. The cardiomediastinal silhouette is unchanged. IMPRESSION: ET tube 3 cm above the carina. Unchanged patchy airspace opacities, right greater than left. Electronically Signed   By: Prudencio Pair M.D.   On: 10/07/2018 10:05   Dg Chest Port 1 View  Result Date: 10/06/2018 CLINICAL DATA:  33 year old female with centralized placement. EXAM: PORTABLE CHEST 1 VIEW COMPARISON:  Chest radiograph dated 10/05/2018 FINDINGS: Right IJ central venous line with tip over the cavoatrial junction. No pneumothorax. Bilateral airspace opacities similar to prior radiograph. There is a small right pleural effusion. No acute osseous pathology. IMPRESSION: 1. Interval placement of a right IJ central venous line with tip in the region of the cavoatrial junction. No pneumothorax. 2. No significant interval change in the bilateral airspace opacities. Electronically Signed   By: Anner Crete M.D.   On: 10/06/2018  04:04   Dg Chest Port 1 View  Result Date: 10/02/2018 CLINICAL DATA:  Tachypnea EXAM: PORTABLE CHEST 1 VIEW COMPARISON:  Yesterday FINDINGS: Generalized reticular pulmonary opacity, present on multiple prior studies. No pleural fluid or pneumothorax. Normal heart size. IMPRESSION: Chronic reticular pulmonary opacity without acute superimposed finding. Electronically Signed   By: Monte Fantasia M.D.   On: 10/02/2018 05:45   Dg Chest Port 1 View  Result Date: 10/01/2018 CLINICAL DATA:  Cough, pancytopenia EXAM: PORTABLE CHEST 1 VIEW COMPARISON:  09/19/2018 FINDINGS: Stable cardiomediastinal contours. Extensive, diffuse reticulonodular opacities throughout both lungs, markedly progressed from prior. No pleural effusion. No pneumothorax. Osseous structures intact. IMPRESSION: Extensive reticulonodular opacities throughout both lungs suggestive of an atypical infection in an immunocompromised patient. Electronically Signed   By: Davina Poke M.D.   On: 10/01/2018 18:38    Vas [place] Lower Extremity Venous (dvt)  Result Date: 10/07/2018  Lower Venous Study Indications: SOB, and Edema.  Risk Factors: Advanced HIV/AIDS. Limitations: Lights on in room, patient newly vented. Comparison Study: Prior study from 06/20/17 is available for comparison Performing Technologist: Sharion Dove RVS  Examination Guidelines: A complete evaluation includes B-mode imaging, spectral Doppler, color Doppler, and power Doppler as needed of all accessible portions of each vessel. Bilateral testing is considered an integral part of a complete examination. Limited examinations for reoccurring indications may be performed as noted.  +---------+---------------+---------+-----------+----------+--------------+  RIGHT     Compressibility Phasicity Spontaneity Properties Thrombus Aging  +---------+---------------+---------+-----------+----------+--------------+  CFV       Full            Yes       Yes                                    +---------+---------------+---------+-----------+----------+--------------+  SFJ       Full                                                             +---------+---------------+---------+-----------+----------+--------------+  FV Prox   Full                                                             +---------+---------------+---------+-----------+----------+--------------+  FV Mid    Full                                                             +---------+---------------+---------+-----------+----------+--------------+  FV Distal Full                                                             +---------+---------------+---------+-----------+----------+--------------+  PFV       Full                                                             +---------+---------------+---------+-----------+----------+--------------+  PFV       Full                                                             +---------+---------------+---------+-----------+----------+--------------+  POP       Full            Yes       Yes                                    +---------+---------------+---------+-----------+----------+--------------+  PTV        Full                                                             +---------+---------------+---------+-----------+----------+--------------+  PERO      Full                                                             +---------+---------------+---------+-----------+----------+--------------+   +---------+---------------+---------+-----------+----------+--------------+  LEFT      Compressibility Phasicity Spontaneity Properties Thrombus Aging  +---------+---------------+---------+-----------+----------+--------------+  CFV       Full            Yes       Yes                                    +---------+---------------+---------+-----------+----------+--------------+  SFJ       Full                                                             +---------+---------------+---------+-----------+----------+--------------+  FV Prox   Full                                                             +---------+---------------+---------+-----------+----------+--------------+  FV Mid    Full                                                             +---------+---------------+---------+-----------+----------+--------------+  FV Distal Full                                                             +---------+---------------+---------+-----------+----------+--------------+  POP       Full            Yes       Yes                                    +---------+---------------+---------+-----------+----------+--------------+  PTV       Full                                                             +---------+---------------+---------+-----------+----------+--------------+  PERO                                                       Not visualized  +---------+---------------+---------+-----------+----------+--------------+     Summary: Right: Findings appear essentially unchanged  compared to previous examination. There is no evidence of deep vein thrombosis in the lower extremity. Left: Findings appear essentially unchanged compared to previous examination. There is no evidence of deep vein thrombosis in the lower extremity. However, portions of this examination were limited- see technologist comments above.  *See table(s) above for measurements and observations. Electronically signed by Monica Martinez MD on 10/07/2018 at 6:03:03 PM.    Final    Vas [place] Upper Extremity Venous Duplex  Result Date: 10/15/2018 UPPER VENOUS STUDY  Indications: Edema Limitations: Bandages and Edema and size of the veins. Comparison Study: No previous study available Performing Technologist: Toma Copier RVS  Examination Guidelines: A complete evaluation includes B-mode imaging, spectral Doppler, color Doppler, and power Doppler as needed of all accessible portions of each vessel. Bilateral testing is considered an integral part of a complete examination. Limited examinations for reoccurring indications may be performed as noted.  Right Findings: +----------+------------+---------+-----------+----------+---------------------+  RIGHT      Compressible Phasicity Spontaneous Properties        Summary         +----------+------------+---------+-----------+----------+---------------------+  IJV                                                      Not visualized due to                                                               dialysis access     +----------+------------+---------+-----------+----------+---------------------+  Subclavian     Full        Yes        Yes                                       +----------+------------+---------+-----------+----------+---------------------+  Axillary       Full        Yes  Yes                                       +----------+------------+---------+-----------+----------+---------------------+  Brachial       Full        Yes        Yes                                        +----------+------------+---------+-----------+----------+---------------------+  Radial         Full                                                             +----------+------------+---------+-----------+----------+---------------------+  Ulnar          Full                                                             +----------+------------+---------+-----------+----------+---------------------+  Cephalic                                                 Not visualized due to                                                            edema and size of the                                                                    vein           +----------+------------+---------+-----------+----------+---------------------+  Basilic                                                  Not visualized due to                                                            the size of the vein   +----------+------------+---------+-----------+----------+---------------------+ Technically limited due to dialysis access in the neck, size and edema.  Left Findings: +----------+------------+---------+-----------+----------+---------------------+  LEFT       Compressible Phasicity Spontaneous Properties  Summary         +----------+------------+---------+-----------+----------+---------------------+  IJV                                                      Not visualized due to                                                                IV placement       +----------+------------+---------+-----------+----------+---------------------+  Subclavian                                               Not visualized due to                                                               bandages for IV                                                                     placement        +----------+------------+---------+-----------+----------+---------------------+  Axillary       Full        Yes        Yes                                        +----------+------------+---------+-----------+----------+---------------------+  Brachial       Full        Yes        Yes                                       +----------+------------+---------+-----------+----------+---------------------+  Radial         Full                                                             +----------+------------+---------+-----------+----------+---------------------+  Ulnar          Full                                                             +----------+------------+---------+-----------+----------+---------------------+  Cephalic                                                 Not visualize due to                                                                size and edema      +----------+------------+---------+-----------+----------+---------------------+  Basilic                                                  Not visualized due to                                                               size and edema      +----------+------------+---------+-----------+----------+---------------------+ Technically limited due to bandages, IV placement, size. and edema  Summary:  Right: No evidence of deep vein thrombosis in the upper extremity. However, unable to visualize all veins. See comments listed above. No evidence of superficial vein thrombosis . However, unable to visualize See comments listed above. No evidence of thrombosis in the subclavian. This was a limited study.  Left: No evidence of deep vein thrombosis in the upper extremity. However, unable to visualize all veins. See comments listed avone. No evidence of superficial vein thrombosis . However, unable to visualize all veins. See comments listed avone. Unable to visualize the subclavian/ See comments listed abone. This was a limited study.  *See table(s) above for measurements and observations.  Diagnosing physician: Servando Snare MD Electronically signed by Servando Snare MD on 10/15/2018 at 1:15:33  PM.    Final    09/11/2018 Bone Marrow Biopsy      09/11/2018 [employer]   ASSESSMENT AND PLAN:  This is a 33 year old unfortunate African-American female with a very complex medical history and course with HIV AIDS CD4 count less than 50 on ART with recurrent admissions for sepsis and possible PJP with  1. Anemia-transfusion dependent 2. Thrombocytopenia-significant thrombocytopenia currently transfusion dependent 3.  Leukocytosis-with elevated procalcitonin levels and significantly elevated interleukin-6 level suggesting severe inflammation. 4.  PJP recently, recurrent sepsis, etiology of pulmonary infiltrates unclear 5.  HIV/AIDS on Biktarvy. On Atovaquone for PCP prophylaxis and Azithromycin for MAI prophylaxis. 6. Splenomegaly and some lymphadenopathy noted on previous imaging  7.  Acute respiratory failure currently intubated and on a mechanical ventilator. 8. Concern for secondary [place]  Patient with pancytopenia with extensive workup including 2 BM Bx's . Patients blood counts havent had a chance for sustained improvement on account of recurrent sepsis, medication causing BM suppression and cytopenia, HIV/AIDS related dysplastic changes on last BM Bx. She has had improvement in her condition with previous antibiotic treatments and brief use of steroids and PJP treatment. Bone marrow examination has not shown overt infectious etiology, was negative for MAI or  other fungal processes. No overt evidence of lymphoma in the bone marrow biopsy. Lymphadenopathy and splenomegaly could be from her HIV AIDS as well as recurrent infections. Currently cultures remain negative and patient is on empiric treatment for healthcare acquired pneumonia.  Significantly elevated interleukin-6 levels suggest severe inflammatory state.  This could be from recurrent infections or macrophage activation syndrome related to HIV. Also has elevated ferritin level in the setting of significant transfusion  history and from acute inflammation. Triglyceride levels have fluctuated the last ones are elevated. LDH levels are elevated but this could be in the setting of alternative pulmonary process. She has had very poor nutritional status on account of recurrent admissions sepsis AIDS and other considerations.  This can also play into her bone marrow suppression.  She has had increasing blood CMV titers which infectious disease is aware of but do not feel this is the primary driver of her lung findings or bone marrow suppression.  They have also sent out parvovirus testing to evaluate for this is a sign of bone marrow suppression.  Patient did not respond to IVIG previously to suggest immune cytopenias.  Plan -Patient is now extubated and is doing clinically better. -Still requiring CRRT for fluid removal with plans to discontinue later today -Mother updated at bedside. -Her platelets have improved some to 21k -Continue on weekly Nplate at 5 mcg/kg to help boost her platelets to reduce platelet transfusion needs and to potentially provide some safety margin for the possible use of Etoposide. -Continue dexamethasone at 16 mg daily for 7 days and then will continue to taper -Discussed elevated soluble interleukin-2 receptor levels.  These are certainly also additional concern for secondary [place] though not definitely elevated more than 10,000.  If more than 10,000 this would be even more specific.  However the patient will has been on steroids and that could have potentially had a bearing on these levels as well. -Discussed the use of low-dose etoposide and the risks and benefits.  Patient and family would like to wait for a few more days to evaluate her clinical course and platelets prior to restarting this. -The patient may also benefit from transfer to a tertiary care facility if feasible given her condition.   -We shall continue to follow -Appreciate assistance from [facility] and hospitalist  services.  Mikey Bussing, DNP, AGPCNP-BC, AOCNP   ADDENDUM  .Patient was Personally and independently interviewed, examined and relevant elements of the history of present illness were reviewed in details and an assessment and plan was created. All elements of the patient's history of present illness , assessment and plan were discussed in details with Mikey Bussing, DNP. The above documentation reflects our combined findings assessment and plan.  Sullivan Lone MD MS

## 2018-10-16 NOTE — Progress Notes (Signed)
Per Dr. Posey Pronto with nephrology, d/c CRRT today at 3pm. Remove foley and monitor for 24 hours. Orders in for bladder scan at 8am on 10/17/18. Foley removed by this RN and CRRT will be stopped at 3pm.

## 2018-10-16 NOTE — Progress Notes (Signed)
Patient temp 99.68 via bladder. Blood warmer removed from CRRT.

## 2018-10-17 DIAGNOSIS — D638 Anemia in other chronic diseases classified elsewhere: Secondary | ICD-10-CM

## 2018-10-17 LAB — CBC WITH DIFFERENTIAL/PLATELET
Abs Immature Granulocytes: 0.89 10*3/uL — ABNORMAL HIGH (ref 0.00–0.07)
Basophils Absolute: 0 10*3/uL (ref 0.0–0.1)
Basophils Relative: 0 %
Eosinophils Absolute: 0 10*3/uL (ref 0.0–0.5)
Eosinophils Relative: 0 %
HCT: 19.3 % — ABNORMAL LOW (ref 36.0–46.0)
Hemoglobin: 6.1 g/dL — CL (ref 12.0–15.0)
Immature Granulocytes: 5 %
Lymphocytes Relative: 7 %
Lymphs Abs: 1.2 10*3/uL (ref 0.7–4.0)
MCH: 30.8 pg (ref 26.0–34.0)
MCHC: 31.6 g/dL (ref 30.0–36.0)
MCV: 97.5 fL (ref 80.0–100.0)
Monocytes Absolute: 0.9 10*3/uL (ref 0.1–1.0)
Monocytes Relative: 6 %
Neutro Abs: 13.5 10*3/uL — ABNORMAL HIGH (ref 1.7–7.7)
Neutrophils Relative %: 82 %
Platelets: 26 10*3/uL — CL (ref 150–400)
RBC: 1.98 MIL/uL — ABNORMAL LOW (ref 3.87–5.11)
RDW: 19.1 % — ABNORMAL HIGH (ref 11.5–15.5)
WBC: 16.5 10*3/uL — ABNORMAL HIGH (ref 4.0–10.5)
nRBC: 7.1 % — ABNORMAL HIGH (ref 0.0–0.2)

## 2018-10-17 LAB — RENAL FUNCTION PANEL
Albumin: 2.3 g/dL — ABNORMAL LOW (ref 3.5–5.0)
Anion gap: 12 (ref 5–15)
BUN: 58 mg/dL — ABNORMAL HIGH (ref 6–20)
CO2: 22 mmol/L (ref 22–32)
Calcium: 7.3 mg/dL — ABNORMAL LOW (ref 8.9–10.3)
Chloride: 104 mmol/L (ref 98–111)
Creatinine, Ser: 1.57 mg/dL — ABNORMAL HIGH (ref 0.44–1.00)
GFR calc Af Amer: 50 mL/min — ABNORMAL LOW (ref >60)
GFR calc non Af Amer: 43 mL/min — ABNORMAL LOW (ref >60)
Glucose, Bld: 123 mg/dL — ABNORMAL HIGH (ref 70–99)
Phosphorus: 2.6 mg/dL (ref 2.5–4.6)
Potassium: 4.4 mmol/L (ref 3.5–5.1)
Sodium: 138 mmol/L (ref 135–145)

## 2018-10-17 LAB — GLUCOSE, CAPILLARY
Glucose-Capillary: 104 mg/dL — ABNORMAL HIGH (ref 70–99)
Glucose-Capillary: 134 mg/dL — ABNORMAL HIGH (ref 70–99)
Glucose-Capillary: 162 mg/dL — ABNORMAL HIGH (ref 70–99)
Glucose-Capillary: 165 mg/dL — ABNORMAL HIGH (ref 70–99)
Glucose-Capillary: 170 mg/dL — ABNORMAL HIGH (ref 70–99)
Glucose-Capillary: 184 mg/dL — ABNORMAL HIGH (ref 70–99)
Glucose-Capillary: 192 mg/dL — ABNORMAL HIGH (ref 70–99)
Glucose-Capillary: 205 mg/dL — ABNORMAL HIGH (ref 70–99)
Glucose-Capillary: 206 mg/dL — ABNORMAL HIGH (ref 70–99)
Glucose-Capillary: 208 mg/dL — ABNORMAL HIGH (ref 70–99)
Glucose-Capillary: 225 mg/dL — ABNORMAL HIGH (ref 70–99)
Glucose-Capillary: 227 mg/dL — ABNORMAL HIGH (ref 70–99)

## 2018-10-17 LAB — PREPARE RBC (CROSSMATCH)

## 2018-10-17 LAB — HEMOGLOBIN AND HEMATOCRIT, BLOOD
HCT: 25.1 % — ABNORMAL LOW (ref 36.0–46.0)
Hemoglobin: 8.1 g/dL — ABNORMAL LOW (ref 12.0–15.0)

## 2018-10-17 LAB — MAGNESIUM: Magnesium: 2 mg/dL (ref 1.7–2.4)

## 2018-10-17 LAB — APTT: aPTT: 38 seconds — ABNORMAL HIGH (ref 24–36)

## 2018-10-17 MED ORDER — CALCIUM GLUCONATE-NACL 1-0.675 GM/50ML-% IV SOLN
1.0000 g | Freq: Once | INTRAVENOUS | Status: AC
  Administered 2018-10-17: 1000 mg via INTRAVENOUS
  Filled 2018-10-17: qty 50

## 2018-10-17 MED ORDER — SODIUM CHLORIDE 0.9% IV SOLUTION: Freq: Once | INTRAVENOUS | Status: DC

## 2018-10-17 MED ORDER — ORAL CARE MOUTH RINSE
15.0000 mL | Freq: Two times a day (BID) | OROMUCOSAL | Status: DC
  Administered 2018-10-18 – 2018-10-23 (×5): 15 mL via OROMUCOSAL

## 2018-10-17 MED ORDER — INSULIN REGULAR(HUMAN) IN NACL 100-0.9 UT/100ML-% IV SOLN
INTRAVENOUS | Status: DC
  Administered 2018-10-17: 01:00:00 1.9 [IU]/h via INTRAVENOUS

## 2018-10-17 MED ORDER — ACETAMINOPHEN 325 MG PO TABS: 650.0000 mg | ORAL_TABLET | Freq: Once | ORAL | Status: DC

## 2018-10-17 MED ORDER — CHLORHEXIDINE GLUCONATE 0.12 % MT SOLN
15.0000 mL | Freq: Two times a day (BID) | OROMUCOSAL | Status: DC
  Administered 2018-10-18 – 2018-10-23 (×11): 15 mL via OROMUCOSAL
  Filled 2018-10-17 (×9): qty 15

## 2018-10-17 MED ORDER — SODIUM CHLORIDE 0.9% IV SOLUTION
Freq: Once | INTRAVENOUS | Status: AC
  Administered 2018-10-17: 11:00:00 via INTRAVENOUS

## 2018-10-17 MED ORDER — DIPHENHYDRAMINE HCL 50 MG/ML IJ SOLN: 25.0000 mg | Freq: Once | INTRAMUSCULAR | Status: DC

## 2018-10-17 NOTE — Progress Notes (Signed)
[facility] Progress Note Patient Name: Vesper Breitenstein DOB: 10/06/1985 MRN: [id number]   Date of Service  10/17/2018  HPI/Events of Note  Hemoglobin 6.1, platelets 26, pt with hx of HIV? AIDS and chronic anemia/ thrombocytopenia. Not overtly bleeding currently.  eICU Interventions  Transfuse 2 units of PRBC now, no indication for platelet transfusion currently, but will monitor closely.        Kerry Kass Ogan 10/17/2018, 5:45 AM

## 2018-10-17 NOTE — Progress Notes (Signed)
Patient ID: Krystal Short, female   DOB: 1985/08/10, 33 y.o.   MRN: [id number] [facility] Progress Note   Assessment/ Plan:   1. Acute kidney Injury: Suspected to be secondary to ATN versus cortical necrosis from septic shock.  Now off CRRT with some urine output noted.  Labs reviewed/patient examined and she does not have any indications for renal replacement therapy at this time.  We will continue to monitor daily labs and follow urine output.  Anticipate blood transfusion will help improve renal perfusion as well. 2.  Acute/recurrent hypoxemic respiratory failure: With bronchoalveolar lavage showing Staphylococcus epidermidis for which he is on antimicrobial coverage.  Testing for PJP negative. 3.  Thrombocytopenia/anemia: Ongoing work-up including bone marrow biopsy recently undertaken.  Additional management per hematology.  Getting PRBC transfusion at this time. 4.  Hypophosphatemia: Secondary to CRRT losses/GI losses, replaced via intravenous route  Subjective:   Anemic on labs this morning-getting PRBC transfusion.  With some urine output earlier this morning.   Objective:   BP (!) 110/55   Pulse (!) 116   Temp 97.9 F (36.6 C) (Oral)   Resp (!) 33   Ht _0  (1.549 m)   Wt 48.6 kg   LMP  (LMP Unknown) Comment: pt is on depo  SpO2 100%   BMI 20.24 kg/m   Intake/Output Summary (Last 24 hours) at 10/17/2018 1229 Last data filed at 10/17/2018 1000 Gross per 24 hour  Intake 1228.04 ml  Output 353 ml  Net 875.04 ml   Weight change: -2.1 kg  Physical Exam: Gen: Resting comfortably in bed overnight, mother at bedside giving her foot reflexology CVS: Pulse regular tachycardia, S1 and S2 with ejection systolic murmur Resp: Coarse breath sounds bilaterally without distinct rales or rhonchi Abd: Soft, flat, nontender Ext: Trace-1+ edema over both legs  Imaging: Dg Abd 1 View  Result Date: 10/15/2018 CLINICAL DATA:  Check feeding tube placement EXAM: ABDOMEN - 1  VIEW COMPARISON:  10/07/2018 FINDINGS: Nasogastric catheter has been removed. A weighted feeding catheter is now seen within the mid stomach. Scattered large and small bowel gas is noted. No bony abnormality is seen. IMPRESSION: Feeding catheter within the mid stomach. Electronically Signed   By: Inez Catalina M.D.   On: 10/15/2018 14:05   Ct Chest Wo Contrast  Result Date: 10/16/2018 CLINICAL DATA:  Acute respiratory failure EXAM: CT CHEST WITHOUT CONTRAST TECHNIQUE: Multidetector CT imaging of the chest was performed following the standard protocol without IV contrast. COMPARISON:  None. FINDINGS: Cardiovascular: Heart is normal size.  Aorta is normal caliber. Mediastinum/Nodes: Enlarged mediastinal lymph nodes. Index right paratracheal node has a short axis diameter of 11 mm. These are stable since prior study. No axillary adenopathy. Difficult to assess the hila without intravenous contrast. Lungs/Pleura: Extensive interstitial thickening, ground-glass opacities. More confluent consolidation seen in the left upper lobe and both lower lobes. Trace bilateral effusions. Upper Abdomen: Imaging into the upper abdomen shows no acute findings. Probable splenomegaly. The spleen is not imaged in its entirety. Musculoskeletal: Chest wall soft tissues are unremarkable. No acute bony abnormality. IMPRESSION: Diffuse ground-glass airspace disease throughout the lungs with interlobular septal thickening. More confluent consolidation in the left upper lobe and both superior segments of the lower lobes. This is similar to prior study and presumably reflects atypical infection there is mediastinal adenopathy which is also stable. Sarcoidosis could have a similar appearance in the appropriate clinical setting. Trace bilateral effusions. Probable splenomegaly. The spleen is not imaged in its entirety but  appears enlarged in the visualized upper abdomen. Recommend clinical correlation. Electronically Signed   By: Rolm Baptise  M.D.   On: 10/16/2018 19:05    Labs: BMET Recent Labs  Lab 10/13/18 1716 10/14/18 0404 10/14/18 1708 10/15/18 0330 10/15/18 1732 10/16/18 0350 10/17/18 0423  NA 136 136 136 137  137 136 134* 138  K 4.2 3.7 4.2 3.9  4.0 3.8 4.2 4.4  CL 102 101 100 102  103 101 101 104  CO2 _0 GLUCOSE 270* 162* 282* 102*  100* 177* 173* 123*  BUN 37* 35* 35* 27*  28* 27* 31* 58*  CREATININE 0.99 0.95 1.00 1.06*  1.13* 1.04* 1.09* 1.57*  CALCIUM 7.1* 7.6* 7.4* 7.7*  7.6* 7.9* 7.7* 7.3*  PHOS 3.6 2.4*  2.5 9.6* 3.6  3.6 2.3* 2.4* 2.6   CBC Recent Labs  Lab 10/11/18 0455  10/12/18 0407  10/14/18 0404 10/15/18 0330 10/16/18 0725 10/17/18 0423  WBC 15.0*  --  17.8*   < > 21.0* 25.3* 16.8* 16.5*  NEUTROABS 12.6*  --  13.1*  --   --   --  13.4* 13.5*  HGB 7.9*  --  7.6*   < > 8.8* 8.1* 7.8* 6.1*  HCT 23.9*  --  23.3*   < > 26.4* 25.0* 24.6* 19.3*  MCV 90.5  --  92.1   < > 91.7 93.6 96.5 97.5  PLT 7*   < > 11*   < > 13* 16* 21* 26*   < > = values in this interval not displayed.    Medications:    . atovaquone  1,500 mg Oral Daily  . azithromycin  1,200 mg Oral Q Tue  . benzonatate  100 mg Oral TID  . chlorhexidine  15 mL Mouth Rinse BID  . chlorhexidine gluconate (MEDLINE KIT)  15 mL Mouth Rinse BID  . Chlorhexidine Gluconate Cloth  6 each Topical Daily  . dexamethasone  16 mg Intravenous Q24H  . emtricitabine-tenofovir AF  1 tablet Per Tube Daily   And  . dolutegravir  50 mg Oral Daily  . feeding supplement (PRO-STAT SUGAR FREE 64)  30 mL Per Tube QID  . guaiFENesin-dextromethorphan  7.5 mL Per Tube Q6H  . insulin aspart  2-6 Units Subcutaneous Q4H  . lidocaine  1 patch Transdermal Q24H  . mouth rinse  15 mL Mouth Rinse 10 times per day  . mometasone-formoterol  2 puff Inhalation BID  . pantoprazole sodium  40 mg Per Tube Daily  . romiPLOStim  5 mcg/kg Subcutaneous Weekly  . sodium chloride flush  10-40 mL Intracatheter Q12H   Elmarie Shiley,  MD 10/17/2018, 12:29 PM

## 2018-10-17 NOTE — Progress Notes (Signed)
NAMESymphanie Short, MRN:  [id number], DOB:  May 15, 1985, LOS: 18 ADMISSION DATE:  10/05/2018, CONSULTATION DATE:  10/06/2018 REFERRING MD:  Dr. Craig Guess, CHIEF COMPLAINT:  SOB   Brief History   Krystal Short is a 33 yo F with history of HIV/AIDS admitted on 8/29 with progressive shortness of breath. History of multiple admissions over the last year for waxing / waning pulmonary infiltrates. Unfortunately patient decompensated on 8/31 requiring initiation of mechanical intubation and later pressure support and CRRT.    Ferritin consistently hight - Results for Krystal Short, Krystal Short (MRN [id number]) as of 10/16/2018 09:40  Ref. Range 05/30/2018 09:04 07/04/2018 14:15 08/02/2018 11:24 09/05/2018 06:40 10/02/2018 11:12 10/11/2018 12:27 10/13/2018 03:43 10/14/2018 17:08  Ferritin Latest Ref Range: 11 - 307 ng/mL 603 (H) 1,106 (H) 5,223 (H) 4,590 (H) 9,379 (H) >7,500 (H) >7,500 (H) >7,500 (H)    Recent Evaluation  03/08/18-03/10/18 Acute respiratory failure secondary to pneumonia (rhinovirus). tx with rocephin/ zithromax, Bactrim for PCP. Macrocytic anemia   04/22/2018- Flu A infection. Treated with tamiflu as outpatient  05/28/18- 05/31/18 admitted with respiratory failure, lung infiltrates.  Treated with Bactrim, prednisone with improvement  06/14/2018-07/16/2018 Bordetella bronchoseptica infection. Underwent FOB 07/12/2018. PJP test not done as BAL was lost.Biopsy scant lung parenchyma with fibrosis and reactive changes. Required 4 PRBC and 3 platelet infusion. Bm bx -> with hemophagotycosis. June 2020 CT abd - with mild hepatosplenomegaly  07/31/2018-08/22/2018 sepsis from suspected PJP pneumonia. Treated with steroids clindamycin and primaquine. Required 8 PRBC and 7 platelet transfusion. RVP positive for routine coronavirus.  09/03/2018-09/13/2018 generalized weakness and deconditioning. Required 3 PRBC 8 platelet transfusion.  7/30/2020IVIGwithout any improvement in platelet count. Given prolonged prednisone  taper.  09/11/18 Bone marrowBx all micro studies neg   10/01/2018-10/04/2018 presented with anemia and poor IV access. Given 2 PRBC 1 platelet transfusion. Required pressors. Treated with ceftriaxone and doxy for ? CAP.  Significant Hospital Events   8/29 Admit  8/30 PCCM consulted,started solumedrol 8/30 am @ 80 mg q 12h 8/31 Intubated, FOB 9/1 CRRT  9/4 High dose decadron 20 mg IV qd started by heme due to concern of secondary [place]. Send soluble CD 25/ IL2Ra levels - returned on 9/9 as high > 6000 9/5 Weaning pressors, starting PSV weans, improving CXR 9/7 - Awake on the vent.  No acute events. Weaning on pressure support 8/5 yesterday 9/8 -  extubated yesterda. Needed bipap lat night for resp "spell". Now on [place] and doing well. Denies vaping . Admits to using DOWN PILLOW (feather) At home x 7 months - new. However, denies  any organic antigen (mold, birds, mildew, humidifer) but also wanted to confirm with husband. Called husband but did not pick up. SLP pending - cough was  Hoarse and weak at bedside. ANuric and on CRRT   9/9  - down to 2L [place]. Per RN =SPB runing soft and MAP barely 65. ANuric. On negative balance with CRRT. Also, each night cRRT filter clots she gets more hypoxemic. Per Dr Vaughan Browner - soluble IL2RA +++ > 2SD (http://saintantoine.aphp.fr/score/ -> H score 261 -> 99.6% prob for [facility])     Consults:  PCCM 8/30 ID 8/30 Hem/Onc 8/30 Nephrology 9/1  Procedures:  R IJ CVC 8/30 >> ETT 8/31 >> FOB 8/31 >> L HD cath 9/1 >>  Significant Diagnostic Tests:  Head CT 8/31 >> negative for any acute intracranial process Renal ultrasound 9/2 >> No hydronephrosis, medical renal disease   Chest x-ray 9/6-improving pulmonary infiltrates.  Personally reviewed  Micro Data:  COVID 19 pcr 8/29>>neg SARS COV2 IgG 9/5- neg  BCx 2 8/29 >>no growth 2 days PCP8/31>>neg 9/1 FOB BAL 8/31 >> 50,000 Coag neg staph, PJP negative  Antimicrobials:  Bactrim 8/29 >>8/30 Maxepime  8/29 >> 9/5 PO Atovaquone 8/30 >> PO Azithromycin 9/1 >>  Interim history/subjective:   9/10 - on [place] o2. Off CRRT - making urine.Not on pressors. Mom at bedside - confirms they are going to get the down pillows thrown out. Asked about other organic antigen exposure. CT with ILD findings esp in  UL. On high dose decadron by Heme for [place].   Objective   Blood pressure 117/61, pulse (!) 121, temperature 97.9 F (36.6 C), temperature source Oral, resp. rate (!) 25, height _0  (1.549 m), weight 48.6 kg, SpO2 100 %.        Intake/Output Summary (Last 24 hours) at 10/17/2018 1218 Last data filed at 10/17/2018 1000 Gross per 24 hour  Intake 1228.04 ml  Output 353 ml  Net 875.04 ml   Filed Weights   10/15/18 0347 10/16/18 0420 10/17/18 0405  Weight: 49.6 kg 50.7 kg 48.6 kg    Emaciated Stable Coughs AxOx3 No distress Moved all 4s     Resolved Hospital Problem list   CirculatoryShock -likely some component of sedation, +/- sepsis  P: Weaned off pressors  Assessment & Plan:  AcuteRecurrentHypoxemic Respiratory Failure--In the setting of advanced HIV/AIDS now with Staph Epid seen on BAL with bloody secretion . Mar[place] Kitchen High prob for [place] (hemophagocytic lymphohistiocytosis)  - ? Triggered by down feather pillow recurrent hypersensitivity pneumonitis .  Extubated 10/14/2018  10/17/2018 - On [place]. CT chest with ILD - not consistent with UIP but has alternative pattern per ATS - mostly in UL (as in HP, sarcoid)  P: O2 for pulse ox > 88% biPAP prn if needed Mom advised to get rid of down pillow      Acute encephalopathy > Improved -In the setting of advanced HIV/AIDS, respiratory and renal failure, and profound thrombocytopenia. Head CT neg for any acute abnormalities   10/17/2018 - resolved x 48h. 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 -> (http://saintantoine.aphp.fr/score/ -> H score 261 -> 99.6% prob for [place]) - ? Triggered by resp failure due to down pillow pneumonitis    - HIT ab negative, no MAHA.  - Started high dose decadron 10/11/2018 by heme and restarted 10/15/2000     10/17/2018 -  Platelets slowly impriving  P: Per hemo consult   Anemia of chronic and critical illness and HLH  - s/p PRBC 10/17/2018    PLAN  - asked RN to limit to 1 unit only  - PRBC for hgb </= 6.9gm%    - exceptions are   -  if ACS susepcted/confirmed then transfuse for hgb </= 8.0gm%,  or    -  active bleeding with hemodynamic instability, then transfuse regardless of hemoglobin value   At at all times try to transfuse 1 unit prbc as possible with exception of active hemorrhage     AKI g  - 10/17/2018 - making urine per mom  P: Per renal   HIV  HIV viral load undetectable but CD4 count remains low   P: Antibiotics and antiretroviral  medication per ID.  Upper extremity edema left greater than right   - 10/14/2018  UE dvt negative  P: monitor   Malnutrition  - low albumin state   9/9 - failed  Swallow  Plan  per [facility]   Transaminitis  - might be feature of [place]  9/9 - improving  Plan  - repeat check as needed  Best practice:  Diet:TF via OG tube Pain/Anxiety/Delirium protocol (if indicated):none VAP protocol (if indicated):In place DVT prophylaxis:SCD's, allergy to heparin GI prophylaxis:PPI Glucose control:N/A Mobility:BR Code Status:Full Family Communication: Mom updatd 10/17/2018   Disposition: ccm will sign off  - will arrange clinic followup     Future Appointments  Date Time Provider [place]  10/22/2018 10:15 AM [facility] LAB 6 [facility] None  10/22/2018 10:45 AM [facility] [place] FLUSH [facility] None   11/05/2018  4:15 PM [place], FNP [facility]  12/02/2018  3:30 PM [facility] NURSE [facility] None       ATTESTATION & SIGNATURE    Dr. Brand Males, M.D., [facility].C.P Pulmonary and Critical Care Medicine Staff Physician [facility] Pager: [phone], If no answer or between  15:00h - 7:00h: call [phone]  10/17/2018 12:18 PM    LABS    PULMONARY Recent Labs  Lab 10/13/18 0430 10/14/18 2230  PHART 7.490* 7.480*  PCO2ART 34.3 35.2  PO2ART 99.6 467*  HCO3 25.9 25.9  O2SAT 98.4 100.0    CBC Recent Labs  Lab 10/15/18 0330 10/16/18 0725 10/17/18 0423  HGB 8.1* 7.8* 6.1*  HCT 25.0* 24.6* 19.3*  WBC 25.3* 16.8* 16.5*  PLT 16* 21* 26*    COAGULATION Recent Labs  Lab 10/11/18 1227  INR 1.3*    CARDIAC  No results for input(s): TROPONINI in the last 168 hours. No results for input(s): PROBNP in the last 168 hours.   CHEMISTRY Recent Labs  Lab 10/13/18 0343  10/14/18 0404 10/14/18 1708 10/15/18 0330 10/15/18 1732 10/16/18 0350 10/17/18 0423  NA 137   < > 136 136 137  137 136 134* 138  K 3.6   < > 3.7 4.2 3.9  4.0 3.8 4.2 4.4  CL 100   < > 101 100 102  103 101 101 104  CO2 24   < > _0 GLUCOSE 168*   < > 162* 282* 102*  100* 177* 173* 123*  BUN 33*   < > 35* 35* 27*  28* 27* 31* 58*  CREATININE 0.98   < > 0.95 1.00 1.06*  1.13* 1.04* 1.09* 1.57*  CALCIUM 7.5*   < > 7.6* 7.4* 7.7*  7.6* 7.9* 7.7* 7.3*  MG 2.3  --  2.4  --  2.4  --  2.5* 2.0  PHOS 2.2*  2.3*   < > 2.4*  2.5 9.6* 3.6  3.6 2.3* 2.4* 2.6   < > = values in this interval not displayed.   Estimated Creatinine Clearance: 39.2 mL/min (A) (by C-G formula based on SCr of 1.57 mg/dL (H)).   LIVER Recent Labs  Lab 10/11/18 1227  10/12/18 0910  10/13/18 0939  10/14/18 1708 10/15/18 0330 10/15/18 1732 10/16/18 0350 10/17/18 0423  AST 56*  --  57*  --  59*  --   --   --   --  58*  --    ALT 21  --  25  --  32  --   --   --   --  35  --   ALKPHOS 374*  --  406*  --  442*  --   --   --   --  370*  --   BILITOT 3.8*  --  3.4*  --  3.4*  --   --   --   --  2.5*  --   PROT 5.7*  --  5.5*  --  6.0*  --   --   --   --  6.8  --   ALBUMIN 1.9*   < > 1.8*   < > 2.1*   < > 2.1* 2.4* 2.3* 2.4*  2.5* 2.3*  INR 1.3*  --   --   --   --   --   --   --   --   --   --    < > = values in this interval not displayed.     INFECTIOUS No results for input(s): LATICACIDVEN, PROCALCITON in the last 168 hours.   ENDOCRINE CBG (last 3)  Recent Labs    10/17/18 0700 10/17/18 0817 10/17/18 0923  GLUCAP 184* 170* 165*         IMAGING x48h  - image(s) personally visualized  -   highlighted in bold Dg Abd 1 View  Result Date: 10/15/2018 CLINICAL DATA:  Check feeding tube placement EXAM: ABDOMEN - 1 VIEW COMPARISON:  10/07/2018 FINDINGS: Nasogastric catheter has been removed. A weighted feeding catheter is now seen within the mid stomach. Scattered large and small bowel gas is noted. No bony abnormality is seen. IMPRESSION: Feeding catheter within the mid stomach. Electronically Signed   By: Inez Catalina M.D.   On: 10/15/2018 14:05   Ct Chest Wo Contrast  Result Date: 10/16/2018 CLINICAL DATA:  Acute respiratory failure EXAM: CT CHEST WITHOUT CONTRAST TECHNIQUE: Multidetector CT imaging of the chest was performed following the standard protocol without IV contrast. COMPARISON:  None. FINDINGS: Cardiovascular: Heart is normal size.  Aorta is normal caliber. Mediastinum/Nodes: Enlarged mediastinal lymph nodes. Index right paratracheal node has a Short axis diameter of 11 mm. These are stable since prior study. No axillary adenopathy. Difficult to assess the hila without intravenous contrast. Lungs/Pleura: Extensive interstitial thickening, ground-glass opacities. More confluent consolidation seen in the left upper lobe and both lower lobes. Trace bilateral effusions. Upper Abdomen: Imaging into  the upper abdomen shows no acute findings. Probable splenomegaly. The spleen is not imaged in its entirety. Musculoskeletal: Chest wall soft tissues are unremarkable. No acute bony abnormality. IMPRESSION: Diffuse ground-glass airspace disease throughout the lungs with interlobular septal thickening. More confluent consolidation in the left upper lobe and both superior segments of the lower lobes. This is similar to prior study and presumably reflects atypical infection there is mediastinal adenopathy which is also stable. Sarcoidosis could have a similar appearance in the appropriate clinical setting. Trace bilateral effusions. Probable splenomegaly. The spleen is not imaged in its entirety but appears enlarged in the visualized upper abdomen. Recommend clinical correlation. Electronically Signed   By: Rolm Baptise M.D.   On: 10/16/2018 19:05

## 2018-10-17 NOTE — Progress Notes (Signed)
  Speech Language Pathology Treatment: Dysphagia  Patient Details Name: Krystal Short MRN: [id number] DOB: [birth year] Today's Date: 10/17/2018 Time: 0712-0750 SLP Time Calculation (min) (ACUTE ONLY): 38 min  Assessment / Plan / Recommendation Clinical Impression  Pt fully alert and engaged this am.  She reports baseline cough most notably in the middle of the night.  RN provided oral care after which SLP provided single ice chips and nectar liquids.  After single ice chip bolus provided, pt uses caution and allows it to fully melt before swallowing.  She did cough and expectorate viscous secretions x1.  Upon trials of nectar liquid via tsp, pt demonstrates consistent s/s of aspiration c/b cough but cough is not productive. Ptt continues with whispered voice but is improved compared to initial evaluation.  Pt will benefit from [facility] when ready to allow instrumental evaluation.  Pt informed of recommendations and advised to continue ice chips, attempts at voice and strengthening "hock" for pharyngeal clearance. Using teach back, pt informed and agreeable to plan.    HPI HPI: 33 yo F with history of HIV/AIDS admitted on 8/29 with progressive shortness of breath. History of multiple admissions over the last year for waxing / waning pulmonary infiltrates. Patient decompensated on 8/31 requiring initiation of mechanical intubation and later pressure support and CRRT, was extubated 10/14/2018.  Swallow eval ordered.  Pt denies h/o dysphagia.  .Pt was taken off CRRT yesterday.  Today is tachycardic.  She reports she is receiving ice chips and her spouse gave her "lots of them" yesterday.  Pt now has a small bore feeding tube and is receiving nutrition. Follow up to determine readiness for po and/or instrumental evaluation.      SLP Plan  Continue with current plan of care;MBS(mbs when clinically indicated prior to po diet initiation)       Recommendations  Diet recommendations: Other(comment)(ice  chips) Medication Administration: Via alternative means Supervision: Full supervision/cueing for compensatory strategies Compensations: Slow rate;Small sips/bites Postural Changes and/or Swallow Maneuvers: Seated upright 90 degrees;Upright 30-60 min after meal                Oral Care Recommendations: Oral care QID Follow up Recommendations: Other (comment)(tbd) SLP Visit Diagnosis: Dysphagia, pharyngeal phase (R13.13) Plan: Continue with current plan of care;MBS(mbs when clinically indicated prior to po diet initiation)       GO                Macario Golds 10/17/2018, 9:12 AM   Luanna Salk, MS [facility] SLP Acute Rehab Services Pager [phone] Office [phone]

## 2018-10-17 NOTE — Progress Notes (Signed)
CRITICAL VALUE ALERT  Critical Value:Hemaglobin at 6.1  Date & Time Notied:  [id number] 10/17/18  Provider Notified: Warren Lacy notified  Orders Received/Actions taken: Waiting for orders; RN will continue to monitor

## 2018-10-17 NOTE — Progress Notes (Signed)
Hematology/Oncology short note  Labs reviewed today  . CBC Latest Ref Rng & Units 10/17/2018 10/16/2018 10/15/2018  WBC 4.0 - 10.5 K/uL 16.5(H) 16.8(H) 25.3(H)  Hemoglobin 12.0 - 15.0 g/dL 6.1(LL) 7.8(L) 8.1(L)  Hematocrit 36.0 - 46.0 % 19.3(L) 24.6(L) 25.0(L)  Platelets 150 - 400 K/uL 26(LL) 21(LL) 16(LL)   . CMP Latest Ref Rng & Units 10/17/2018 10/16/2018 10/15/2018  Glucose 70 - 99 mg/dL 123(H) 173(H) 177(H)  BUN 6 - 20 mg/dL 58(H) 31(H) 27(H)  Creatinine 0.44 - 1.00 mg/dL 1.57(H) 1.09(H) 1.04(H)  Sodium 135 - 145 mmol/L 138 134(L) 136  Potassium 3.5 - 5.1 mmol/L 4.4 4.2 3.8  Chloride 98 - 111 mmol/L 104 101 101  CO2 22 - 32 mmol/L 22 26 25   Calcium 8.9 - 10.3 mg/dL 7.3(L) 7.7(L) 7.9(L)  Total Protein 6.5 - 8.1 g/dL - 6.8 -  Total Bilirubin 0.3 - 1.2 mg/dL - 2.5(H) -  Alkaline Phos 38 - 126 U/L - 370(H) -  AST 15 - 41 U/L - 58(H) -  ALT 0 - 44 U/L - 35 -   Plan -transfuse PRBC PRN prn for hgb<7.5 -transfuse platelets prn for PLT<10k or if bleeding -continue Nplate weekly -continue Dexamethasone 16mg  for 7 days then will taper daily with GI prophylaxis -have recommended consideration of transfer to tertiary care academic center for continued management of secondary [place] and complex cares. -if transfer not possible will consider adding weekly low dose Etoposide from Monday if counts stable.  Ronny Ruddell unbillable.

## 2018-10-17 NOTE — Progress Notes (Signed)
Patient ID: Krystal Short, female   DOB: Feb 07, 1986, 33 y.o.   MRN: [id number]                                                                PROGRESS NOTE                                                                                                                                                                                                             Patient Demographics:    Krystal Short, is a 33 y.o. female, DOB - [birth year], ZDG:[id number]  Admit date - 10/05/2018   Admitting Physician Neena Rhymes, MD  Outpatient Primary MD for the patient is Lajean Manes, MD  LOS - 11  Outpatient Specialists:     Chief Complaint  Patient presents with   Shortness of Breath       Brief Narrative  Patient is an unfortunate 33 year old female history of HIV/AIDS, chronic anemia/thrombocytopenia requiring multiple transfusions in the past, who was admitted on 10/05/2018 with progressive shortness of breath and acute recurrent hypoxemic respiratory failure.  Patient noted have been treated for PGP pneumonia, bacterial pneumonia and with steroids in the past.  Patient noted to have some bone marrow suppression as well as platelets of just 9 which trended down from 16 early on in the month.  Patient underwent a bone marrow biopsy, BAL.  Patient decompensated during the hospitalization and on 10/07/2018 required initiation of mechanical ventilation with pressure support requiring pressors and CRRT.  Patient noted to be having metabolic acidosis, acute kidney injury and subsequently anuric.  Nephrology consulted, ID consulted and are following.  Patient on PCCM service.  Patient extubated 10/14/2018 and patient transferred to hospitalist service.  PCCM following along.    Recent Evaluation  03/08/18-03/10/18 Acute respiratory failure secondary to pneumonia (rhinovirus). tx with rocephin/ zithromax, Bactrim for PCP. Macrocytic anemia   04/22/2018- Flu A infection. Treated with tamiflu as  outpatient  05/28/18- 05/31/18 admitted with respiratory failure, lung infiltrates. Treated with Bactrim, prednisone with improvement  06/14/2018-07/16/2018 Bordetella bronchoseptica infection. Underwent FOB 07/12/2018. PJP test not done as BAL was lost.Biopsy scant lung parenchyma with fibrosis and reactive changes. Required 4 PRBC and 3 platelet infusion.  07/31/2018-08/22/2018 sepsis from suspected PJP pneumonia. Treated with steroids clindamycin and primaquine. Required 8 PRBC and 7  platelet transfusion. RVP positive for routine coronavirus.  09/03/2018-09/13/2018 generalized weakness and deconditioning. Required 3 PRBC 8 platelet transfusion.  7/30/2020IVIGwithout any improvement in platelet count. Given prolonged prednisone taper.  09/11/18 Bone marrowBx all micro studies neg   10/01/2018-10/04/2018 presented with anemia and poor IV access. Given 2 PRBC 1 platelet transfusion. Required pressors. Treated with ceftriaxone and doxy for ? CAP.     Subjective:    Krystal Short today has been afebrile overnite,  Slight cough. Didn't require bipap overnite.  Her sugar was high and E -link placed her on iv insulin.  CRRT stopped yesterday.  Pt seems to be overall improving.    No headache, No chest pain, No abdominal pain - No Nausea, No new weakness tingling or numbness, No SOB.    Assessment  & Plan :    Active Problems:   HIV disease ([place])   Molluscum contagiosum   Symptomatic anemia   Thrombocytopenia (HCC)   ASCUS with positive high risk HPV cervical   Pulmonary infiltrates   Lymphadenopathy, generalized   Acute respiratory failure with hypoxemia (HCC)   AKI (acute kidney injury) (HCC)   Elevated bilirubin   Anemia   Elevated liver enzymes   AIDS (acquired immune deficiency syndrome) (HCC)   ARDS (adult respiratory distress syndrome) (HCC)   [place] (hemophagocytic lymphohistiocytosis) ([place])   Compensated metabolic acidosis   History of ETT   Acute metabolic  encephalopathy   Hypophosphatemia   Protein-calorie malnutrition, moderate ([place])   1. Acute recurrent respiratory failure with hypoxemia/recurrent bilateral pulmonary infiltrates Questionable etiology.  Felt to be secondary to acute lung injury/hypersensitivity pneumonitis with questionable etiology.  Patient noted to have recurrent bilateral pulmonary infiltrates.  Patient intubated and recently extubated on 10/14/2018.   BAL done positive for staph epidermidis.  PGP was negative.  Blood cultures with no growth to date.  Patient received a days worth of Bactrim.  Received IV Maxipime from 10/05/2018>>> 10/12/2018.  Patient now on oral azithromycin per ID recommendations.  Patient on 3 L nasal cannula.  BiPAP as needed.  Patient being followed by ID.  Pulmonary following.  2.  Profound persistent thrombocytopenia/anemia Hemoglobin currently at 7.8.  Platelet count at 21,000.  Patient noted to have had extensive work-up by heme-onc with bone marrow biopsy with a normal bone marrow.  HIT antibody was negative.  M AHA negative.  Patient initially started on steroids for possible sarcoidosis but no granulomas noted on prior biopsies.  Patient is which have been tapered off.  Patient status post transfusion of 6 units of platelets and 3 units of packed red blood cells.  Transfusion threshold hemoglobin less than 7 and platelet count less than 20,000.  Patient to be restarted on weekly Nplate to help boost her platelets per hematology.  Patient was on Decadron 20 mg daily which ended on 10/14/2018.  Will defer resumption of steroids to hematology and PCCM.  Transfuse 2 units prbc for Hgb 6.1 (10/17/2018) check cbc in am  3.  Acute kidney injury/metabolic acidosis/hyperkalemia/hypocalcemia Felt secondary to ATN from septic shock.  Patient anuric.  Patient with soft borderline blood pressures however currently off pressors.  Patient prev on  CRRT. CRRT discontinued on 9/9    Nephrology following and consider  transitioning to IHD.  Per nephrology.  4.  Hypophosphatemia Felt secondary to CRRT losses/GI losses.  Per nephrology.  5.  HIV/AIDS On [place]ik[place].  Continue atovaquone for PCP prophylaxis and azithromycin for MAI prophylaxis.  Per ID.  6.  Concern for secondary  [place] Per hematology and PCCM.  7.  Acute metabolic encephalopathy In the setting of advanced HIV/AIDS acute respiratory failure and acute kidney injury with profound anemia and thrombocytopenia.  Head CT negative.  Improved.  8.  Moderate protein calorie malnutrition Patient started on tube feeds.  9.  Transaminitis Slowly improving.  10. Hyperglycemia Cont insulin GTT   11. Hypocalcemia (corrected calcium low) Calcium gluconate 1gm iv x1 Check renal function panel in am   DVT prophylaxis: Prevalon boots Code Status: Full Family Communication: Updated patient.  No family at bedside. Disposition Plan: Remain in the ICU.   Consultants:  PCCM 8/30 ID 8/30 Hem/Onc 8/30 Nephrology 9/1   Procedures:  R IJ CVC 8/30 >> ETT 8/31 >>10/14/2018 FOB 8/31 >> L HD cath 9/1 >>  Significant Diagnostic Tests:  Head CT 8/31 >> negative for any acute intracranial process Renal ultrasound 9/2 >> No hydronephrosis, medical renal disease  Chest x-ray 9/6-improving pulmonary infiltrates.   Significant Hospital Events   8/29 Admit  8/30 PCCM consulted,started solumedrol 8/30 am @ 80 mg q 12h 8/31 Intubated, FOB 9/1 CRRT 9/4 High dose decadron 20 mg IV qd started by heme due to concern of secondary [place]. Send soluble CD 25/ IL2Ra levels 9/5 Weaning pressors, starting PSV weans, improving CXR 9/7 -Awake on the vent. No acute events.Weaning on pressure support  9/9 CRRT discontinued  Antimicrobials:  Bactrim 8/29 >>8/30 Maxepime 8/29 >> 9/5 PO Atovaquone 8/30 >> PO Azithromycin 9/1 >>        Lab Results  Component Value Date   PLT 26 (LL) 10/17/2018       Anti-infectives (From  admission, onward)   Start     Dose/Rate Route Frequency Ordered Stop   10/09/18 1000  emtricitabine-tenofovir AF (DESCOVY) 200-25 MG per tablet 1 tablet     1 tablet Per Tube Daily 10/08/18 1448     10/09/18 1000  dolutegravir (TIVICAY) tablet 50 mg     50 mg Oral Daily 10/08/18 1448     10/09/18 0600  ceFEPIme (MAXIPIME) 2 g in sodium chloride 0.9 % 100 mL IVPB  Status:  Discontinued     2 g 200 mL/hr over 30 Minutes Intravenous Every 24 hours 10/08/18 0740 10/08/18 1450   10/08/18 1800  ceFEPIme (MAXIPIME) 2 g in sodium chloride 0.9 % 100 mL IVPB     2 g 200 mL/hr over 30 Minutes Intravenous Every 12 hours 10/08/18 1450 10/12/18 1746   10/08/18 1000  azithromycin (ZITHROMAX) tablet 1,200 mg     1,200 mg Oral Every Tue 10/06/18 0520     10/06/18 1800  ceFEPIme (MAXIPIME) 2 g in sodium chloride 0.9 % 100 mL IVPB  Status:  Discontinued     2 g 200 mL/hr over 30 Minutes Intravenous Every 12 hours 10/06/18 0632 10/08/18 0740   10/06/18 1000  atovaquone (MEPRON) 750 MG/5ML suspension 1,500 mg     1,500 mg Oral Daily 10/06/18 0520     10/06/18 1000  bictegravir-emtricitabine-tenofovir AF (BIKTARVY) 50-200-25 MG per tablet 1 tablet  Status:  Discontinued     1 tablet Oral Daily 10/06/18 0520 10/08/18 1448   10/06/18 0045  ceFEPIme (MAXIPIME) 2 g in sodium chloride 0.9 % 100 mL IVPB  Status:  Discontinued     2 g 200 mL/hr over 30 Minutes Intravenous Every 8 hours 10/06/18 0032 10/06/18 0632   10/06/18 0030  sulfamethoxazole-trimethoprim (BACTRIM) 260 mg of trimethoprim in dextrose 5 % 250 mL IVPB  Status:  Discontinued  260 mg of trimethoprim 266.3 mL/hr over 60 Minutes Intravenous Every 8 hours 10/06/18 0029 10/06/18 0801        Objective:   Vitals:   10/17/18 0515 10/17/18 0530 10/17/18 0545 10/17/18 0600  BP:    113/62  Pulse: (!) 120 (!) 114 (!) 119 (!) 121  Resp: (!) 29 (!) 25 (!) 32 (!) 33  Temp:      TempSrc:      SpO2: 100% 100% 99% 100%  Weight:      Height:         Wt Readings from Last 3 Encounters:  10/17/18 48.6 kg  10/04/18 49.3 kg  09/24/18 50.3 kg     Intake/Output Summary (Last 24 hours) at 10/17/2018 0638 Last data filed at 10/17/2018 0400 Gross per 24 hour  Intake 1197.25 ml  Output 915 ml  Net 282.25 ml     Physical Exam  General exam: Appears calm and comfortable.  Frail.  Panda tube in. Respiratory system: Clear to auscultation anterior lung fields.  No wheezes, no crackles, no rhonchi.Marland Kitchen Respiratory effort normal. Cardiovascular system: Tachycardic.  No JVD, no murmurs, no rubs, no gallops.  No lower extremity edema.  Gastrointestinal system: Abdomen is nondistended, soft and nontender. No organomegaly or masses felt. Normal bowel sounds heard. Central nervous system: Alert and oriented. Non focal neurological deficits. Extremities: Right fifth toe on the base blackened.  Pulses noted per Doppler.  Prevalon boots. Skin: No rashes, lesions or ulcers Psychiatry: Judgement and insight appear normal. Mood & affect appropriate.  R IJ in place, L IJ ?hickman   Data Review:    CBC Recent Labs  Lab 10/11/18 0455  10/12/18 0407 10/13/18 0343 10/14/18 0404 10/15/18 0330 10/16/18 0725 10/17/18 0423  WBC 15.0*  --  17.8* 18.6* 21.0* 25.3* 16.8* 16.5*  HGB 7.9*  --  7.6* 6.6* 8.8* 8.1* 7.8* 6.1*  HCT 23.9*  --  23.3* 20.1* 26.4* 25.0* 24.6* 19.3*  PLT 7*   < > 11* 10* 13* 16* 21* 26*  MCV 90.5  --  92.1 93.5 91.7 93.6 96.5 97.5  MCH 29.9  --  30.0 30.7 30.6 30.3 30.6 30.8  MCHC 33.1  --  32.6 32.8 33.3 32.4 31.7 31.6  RDW 17.3*  --  17.3* 17.4* 16.6* 17.4* 18.6* 19.1*  LYMPHSABS 0.8  --  1.5  --   --   --  1.0 1.2  MONOABS 0.9  --  0.6  --   --   --  0.7 0.9  EOSABS 0.0  --  0.0  --   --   --  0.0 0.0  BASOSABS 0.0  --  0.1  --   --   --  0.1 0.0   < > = values in this interval not displayed.    Chemistries  Recent Labs  Lab 10/11/18 1227  10/12/18 0910  10/13/18 0343 10/13/18 0939  10/14/18 0404 10/14/18 1708  10/15/18 0330 10/15/18 1732 10/16/18 0350 10/17/18 0423  NA  --    < > 138   < > 137 137   < > 136 136 137   137 136 134* 138  K  --    < > 3.4*   < > 3.6 3.4*   < > 3.7 4.2 3.9   4.0 3.8 4.2 4.4  CL  --    < > 102   < > 100 104   < > 101 100 102   103 101 101 104  CO2  --    < > 25   < > 24 23   < > _0 GLUCOSE  --    < > 179*   < > 168* 187*   < > 162* 282* 102*   100* 177* 173* 123*  BUN  --    < > 27*   < > 33* 31*   < > 35* 35* 27*   28* 27* 31* 58*  CREATININE  --    < > 0.90   < > 0.98 0.96   < > 0.95 1.00 1.06*   1.13* 1.04* 1.09* 1.57*  CALCIUM  --    < > 7.5*   < > 7.5* 7.6*   < > 7.6* 7.4* 7.7*   7.6* 7.9* 7.7* 7.3*  MG  --    < >  --   --  2.3  --   --  2.4  --  2.4  --  2.5* 2.0  AST 56*  --  57*  --   --  59*  --   --   --   --   --  58*  --   ALT 21  --  25  --   --  32  --   --   --   --   --  35  --   ALKPHOS 374*  --  406*  --   --  442*  --   --   --   --   --  370*  --   BILITOT 3.8*  --  3.4*  --   --  3.4*  --   --   --   --   --  2.5*  --    < > = values in this interval not displayed.   ------------------------------------------------------------------------------------------------------------------ Recent Labs    10/14/18 1708  TRIG 286*    Lab Results  Component Value Date   HGBA1C 5.5 08/09/2018   ------------------------------------------------------------------------------------------------------------------ No results for input(s): TSH, T4TOTAL, T3FREE, THYROIDAB in the last 72 hours.  Invalid input(s): FREET3 ------------------------------------------------------------------------------------------------------------------ Recent Labs    10/14/18 1708  FERRITIN >7,500*    Coagulation profile Recent Labs  Lab 10/11/18 1227  INR 1.3*    No results for input(s): DDIMER in the last 72 hours.  Cardiac Enzymes No results for input(s): CKMB, TROPONINI, MYOGLOBIN in the last 168 hours.  Invalid input(s):  CK ------------------------------------------------------------------------------------------------------------------    Component Value Date/Time   BNP 279.0 (H) 10/02/2018 1112    Inpatient Medications  Scheduled Meds:  sodium chloride   Intravenous Once   sodium chloride   Intravenous Once   atovaquone  1,500 mg Oral Daily   azithromycin  1,200 mg Oral Q Tue   benzonatate  100 mg Oral TID   chlorhexidine  15 mL Mouth Rinse BID   chlorhexidine gluconate (MEDLINE KIT)  15 mL Mouth Rinse BID   Chlorhexidine Gluconate Cloth  6 each Topical Daily   dexamethasone  16 mg Intravenous Q24H   diphenhydrAMINE  25 mg Intravenous Once   emtricitabine-tenofovir AF  1 tablet Per Tube Daily   And   dolutegravir  50 mg Oral Daily   feeding supplement (PRO-STAT SUGAR FREE 64)  30 mL Per Tube QID   guaiFENesin-dextromethorphan  7.5 mL Per Tube Q6H   insulin aspart  2-6 Units Subcutaneous Q4H   lidocaine  1 patch Transdermal Q24H   mouth rinse  15  mL Mouth Rinse 10 times per day   mometasone-formoterol  2 puff Inhalation BID   pantoprazole sodium  40 mg Per Tube Daily   romiPLOStim  5 mcg/kg Subcutaneous Weekly   sodium chloride flush  10-40 mL Intracatheter Q12H   Continuous Infusions:   prismasol BGK 4/2.5 Stopped (10/16/18 1430)    prismasol BGK 4/2.5 200 mL/hr at 10/15/18 2330   sodium chloride 10 mL/hr at 10/11/18 1737   sodium chloride     anticoagulant sodium citrate     feeding supplement (VITAL 1.5 CAL) 1,000 mL (10/16/18 1217)   insulin 1 Units/hr (10/17/18 0602)   prismasol BGK 4/2.5 1,000 mL/hr at 10/16/18 1257   PRN Meds:.sodium chloride, Place/Maintain arterial line **AND** sodium chloride, acetaminophen (TYLENOL) oral liquid 160 mg/5 mL, albuterol, alteplase, anticoagulant sodium citrate, fentaNYL (SUBLIMAZE) injection, midazolam, sodium chloride, sodium chloride flush  Micro Results Recent Results (from the past 240 hour(s))  Culture,  bal-quantitative     Status: Abnormal   Collection Time: 10/07/18 10:16 AM   Specimen: Bronchoalveolar Lavage; Respiratory  Result Value Ref Range Status   Specimen Description   Final    BRONCHIAL ALVEOLAR LAVAGE Performed at [facility] [address] [place], [place] [address]    Special Requests   Final    Immunocompromised Performed at [facility], [place] [address] [place], [place] [address]    Gram Stain   Final    FEW SQUAMOUS EPITHELIAL CELLS PRESENT MODERATE WBC PRESENT, PREDOMINANTLY MONONUCLEAR FEW GRAM POSITIVE COCCI Performed at [facility], [place] [address], [place], [place] [address]    Culture 50,000 COLONIES/mL STAPHYLOCOCCUS EPIDERMIDIS (A)  Final   Report Status 10/10/2018 FINAL  Final   Organism ID, Bacteria STAPHYLOCOCCUS EPIDERMIDIS (A)  Final      Susceptibility   Staphylococcus epidermidis - MIC*    CIPROFLOXACIN 4 RESISTANT Resistant     ERYTHROMYCIN >=8 RESISTANT Resistant     GENTAMICIN 4 SENSITIVE Sensitive     OXACILLIN >=4 RESISTANT Resistant     TETRACYCLINE >=16 RESISTANT Resistant     VANCOMYCIN 1 SENSITIVE Sensitive     TRIMETH/SULFA 80 RESISTANT Resistant     CLINDAMYCIN >=8 RESISTANT Resistant     RIFAMPIN <=0.5 SENSITIVE Sensitive     Inducible Clindamycin NEGATIVE Sensitive     * 50,000 COLONIES/mL STAPHYLOCOCCUS EPIDERMIDIS  Pneumocystis smear by DFA     Status: None   Collection Time: 10/07/18 10:16 AM   Specimen: Bronchial Alveolar Lavage; Respiratory  Result Value Ref Range Status   Specimen Source-PJSRC BRONCHIAL ALVEOLAR LAVAGE  Final   Pneumocystis jiroveci Ag NEGATIVE  Final    Comment: Performed at [facility] Performed at [facility] [address], [place], [place] [address]     Radiology Reports Dg Chest 2 View  Result Date: 10/05/2018 CLINICAL DATA:  Suspected sepsis EXAM: CHEST - 2 VIEW COMPARISON:  10/02/2018, 10/01/2018, 09/19/2018,  05/28/2018, 06/03/2017, 03/11/2013, 10/15/2017, 07/12/2018 FINDINGS: Small right-sided pleural effusion. Underlying reticular interstitial changes suggesting component of chronic disease, however there is interim worsening of bilateral right greater than left airspace disease, suspicious for acute edema or pneumonia superimposed on chronic underlying disease. Stable cardiomediastinal silhouette. No pneumothorax. IMPRESSION: 1. Interval worsening of bilateral right greater than left airspace disease, suspicious for acute edema or pneumonia superimposed on underlying chronic disease. Small right-sided pleural effusion. Electronically Signed   By: Donavan Foil M.D.   On: 10/05/2018 23:52   Dg  Chest 2 View  Result Date: 09/19/2018 CLINICAL DATA:  Hypotensive, recent pneumonia EXAM: CHEST - 2 VIEW COMPARISON:  Chest radiograph 08/26/2018 FINDINGS: Heart size within normal limits. Ill-defined and subtly nodular pulmonary opacities are again demonstrated, more prominent within the right lung. Findings are similar to prior chest radiograph 09/03/2018 and suspicious for infection. No evidence of pneumothorax or pleural effusion. No acute bony abnormality. IMPRESSION: Ill-defined and subtly nodular pulmonary opacities are similar to prior exam 08/26/2018 and suspicious for infection, suspected atypical pneumonia. Electronically Signed   By: Kellie Simmering   On: 09/19/2018 13:33   Dg Abd 1 View  Result Date: 10/15/2018 CLINICAL DATA:  Check feeding tube placement EXAM: ABDOMEN - 1 VIEW COMPARISON:  10/07/2018 FINDINGS: Nasogastric catheter has been removed. A weighted feeding catheter is now seen within the mid stomach. Scattered large and small bowel gas is noted. No bony abnormality is seen. IMPRESSION: Feeding catheter within the mid stomach. Electronically Signed   By: Inez Catalina M.D.   On: 10/15/2018 14:05   Dg Abd 1 View  Result Date: 10/07/2018 CLINICAL DATA:  Abdominal distention. OG tube placement. EXAM:  ABDOMEN - 1 VIEW COMPARISON:  CT scan of the abdomen dated 08/02/2018 FINDINGS: There is gaseous distention of the stomach. The tip of the OG tube lies in the distal stomach. No dilated large or small bowel. Hepatosplenomegaly. Infiltrates at the lung bases. Bones are normal. IMPRESSION: OG tube tip lies in the distal stomach. Gaseous distention of the stomach. Electronically Signed   By: Lorriane Shire M.D.   On: 10/07/2018 09:58   Ct Head Wo Contrast  Result Date: 10/07/2018 CLINICAL DATA:  Altered mental status, intubation, HIV EXAM: CT HEAD WITHOUT CONTRAST TECHNIQUE: Contiguous axial images were obtained from the base of the skull through the vertex without intravenous contrast. COMPARISON:  None. FINDINGS: Brain: No evidence of acute infarction, hemorrhage, hydrocephalus, extra-axial collection or mass lesion/mass effect. Vascular: No hyperdense vessel or unexpected calcification. Skull: Normal. Negative for fracture or focal lesion. Sinuses/Orbits: No acute finding. Other: None. IMPRESSION: No acute intracranial pathology. Electronically Signed   By: Eddie Candle M.D.   On: 10/07/2018 16:28   Ct Chest Wo Contrast  Result Date: 10/16/2018 CLINICAL DATA:  Acute respiratory failure EXAM: CT CHEST WITHOUT CONTRAST TECHNIQUE: Multidetector CT imaging of the chest was performed following the standard protocol without IV contrast. COMPARISON:  None. FINDINGS: Cardiovascular: Heart is normal size.  Aorta is normal caliber. Mediastinum/Nodes: Enlarged mediastinal lymph nodes. Index right paratracheal node has a short axis diameter of 11 mm. These are stable since prior study. No axillary adenopathy. Difficult to assess the hila without intravenous contrast. Lungs/Pleura: Extensive interstitial thickening, ground-glass opacities. More confluent consolidation seen in the left upper lobe and both lower lobes. Trace bilateral effusions. Upper Abdomen: Imaging into the upper abdomen shows no acute findings. Probable  splenomegaly. The spleen is not imaged in its entirety. Musculoskeletal: Chest wall soft tissues are unremarkable. No acute bony abnormality. IMPRESSION: Diffuse ground-glass airspace disease throughout the lungs with interlobular septal thickening. More confluent consolidation in the left upper lobe and both superior segments of the lower lobes. This is similar to prior study and presumably reflects atypical infection there is mediastinal adenopathy which is also stable. Sarcoidosis could have a similar appearance in the appropriate clinical setting. Trace bilateral effusions. Probable splenomegaly. The spleen is not imaged in its entirety but appears enlarged in the visualized upper abdomen. Recommend clinical correlation. Electronically Signed   By: Rolm Baptise  M.D.   On: 10/16/2018 19:05   US Renal  Result Date: 10/09/2018 CLINICAL DATA:  Acute kidney injury. EXAM: RENAL / URINARY TRACT ULTRASOUND COMPLETE COMPARISON:  08/08/2018 FINDINGS: Right Kidney: Renal measurements: 11.2 x 4.4 x 5.8 cm = volume: 149 mL. Echogenicity is diffusely increased. No hydronephrosis. Left Kidney: Renal measurements: 12.4 x 5.5 x 4.6 cm = volume: 163 mL. Diffusely increased echogenicity without hydronephrosis. Bladder: Decompressed by Foley catheter. Note: Small to moderate bilateral pleural effusions noted. IMPRESSION: 1. Bilateral echogenic kidneys without hydronephrosis. Imaging features compatible with medical renal disease. 2. Bilateral pleural effusions. Electronically Signed   By: Misty Stanley M.D.   On: 10/09/2018 20:03   Dg Chest Port 1 View  Result Date: 10/15/2018 CLINICAL DATA:  Hypoxia. EXAM: PORTABLE CHEST 1 VIEW COMPARISON:  Radiograph of October 14, 2018. FINDINGS: The heart size and mediastinal contours are within normal limits. Endotracheal and nasogastric tubes have been removed. Bilateral internal jugular catheters are unchanged in position. Stable bilateral bibasilar opacities are noted concerning for  possible atelectasis or inflammation. The visualized skeletal structures are unremarkable. IMPRESSION: Endotracheal and nasogastric tubes have been removed. Stable bilateral lung opacities as described above. Electronically Signed   By: Marijo Conception M.D.   On: 10/15/2018 07:08   Dg Chest Port 1 View  Result Date: 10/14/2018 CLINICAL DATA:  resp failure EXAM: PORTABLE CHEST 1 VIEW COMPARISON:  10/13/2018 FINDINGS: Endotracheal tube is in place, tip 4.8 centimeters above the carina. RIGHT IJ central line tip overlies the superior vena cava. LEFT IJ central line tip overlies the superior vena cava. Patient is rotated towards the LEFT. Nasogastric tube tip is off the image. The heart is normal in size. Diffuse ground-glass opacities are identified throughout the lungs bilaterally without significant change. No new consolidation. IMPRESSION: Stable bilateral pulmonary infiltrates. Electronically Signed   By: Nolon Nations M.D.   On: 10/14/2018 09:03   Dg Chest Port 1 View  Result Date: 10/13/2018 CLINICAL DATA:  Acute kidney injury secondary to shock and hypoperfusion. Intubated patient. EXAM: PORTABLE CHEST 1 VIEW COMPARISON:  Single-view of the chest 10/12/2018 and 10/10/2018. FINDINGS: Support tubes and lines are unchanged and project in good position. Diffuse hazy bilateral airspace disease has improved. No pneumothorax or pleural fluid. Heart size is normal. IMPRESSION: No change in support apparatus. Improved bilateral airspace disease. Electronically Signed   By: Inge Rise M.D.   On: 10/13/2018 08:33   Dg Chest Port 1 View  Result Date: 10/12/2018 CLINICAL DATA:  Acute respiratory failure EXAM: PORTABLE CHEST 1 VIEW COMPARISON:  Two days ago FINDINGS: Endotracheal tube tip between the clavicular heads and carina. Bilateral central line in the IJ with tips at the SVC. The orogastric tube reaches the stomach. Diffuse hazy opacification of the lungs but significantly improved aeration from  prior. No visible pleural fluid or air leak. IMPRESSION: 1. Unremarkable hardware positioning. 2. Diffuse airspace disease that is improved from 2 days ago. Electronically Signed   By: Monte Fantasia M.D.   On: 10/12/2018 08:16   Dg Chest Port 1 View  Result Date: 10/10/2018 CLINICAL DATA:  ET positioning EXAM: PORTABLE CHEST 1 VIEW COMPARISON:  Radiograph October 08, 2018 FINDINGS: Endotracheal tube is positioned in the mid trachea approximately 4 cm from the carina right IJ catheter terminates at the superior cavoatrial junction. Left IJ dual lumen catheter terminates at the caval-brachiocephalic confluence. Transesophageal tube tip and side port distal to the GE junction. Pacer pad overlies the left chest. Persistent bilateral  airspace opacities demonstrate some interval clearing in the upper lobes. Basilar opacities are similar to prior. Cardiomediastinal silhouette is largely obscured by overlying opacity. No visible pneumothorax or discernible effusion. No acute osseous or soft tissue abnormality. IMPRESSION: 1. Satisfactory positioning of the lines and tubes, as above. 2. Persistent bilateral airspace opacities with some interval clearing in the upper lobes. Electronically Signed   By: Lovena Le M.D.   On: 10/10/2018 05:27   Dg Chest Port 1 View  Result Date: 10/09/2018 CLINICAL DATA:  ARDS EXAM: PORTABLE CHEST 1 VIEW COMPARISON:  Radiograph 10/08/2018 FINDINGS: Pacer pads overlie the chest. Right IJ catheter tip terminates at the level of the superior cavoatrial junction. A left IJ dual lumen catheter tip terminates near the brachiocephalic-caval confluence. Endotracheal tube is positioned in the mid trachea, 3.8 cm from the carina. A transesophageal tube tip and side port are distal to the GE junction. There are diffuse consolidative opacities throughout both lungs on a background of more hazy interstitial opacity. Suspect small right effusion. No pneumothorax. Cardiac silhouette is largely  obscured by the overlying opacities. IMPRESSION: Extensive bilateral airspace opacities, similar in extent to most recent comparison. Stable, satisfactory positioning of lines and tubes, as above Electronically Signed   By: Lovena Le M.D.   On: 10/09/2018 05:40   Dg Chest Port 1 View  Result Date: 10/08/2018 CLINICAL DATA:  CVL placement EXAM: PORTABLE CHEST 1 VIEW COMPARISON:  10/08/2018, 10/07/2018, 10/05/2018 FINDINGS: Endotracheal tube tip is about 4.3 cm superior to the carina. Esophageal tube tip below the diaphragm but non included. Right IJ central venous catheter tip over the SVC. Left IJ central venous catheter tip over the upper SVC. No left pneumothorax. Extensive bilateral interstitial and alveolar disease. Obscured cardiomediastinal silhouette. Probable small effusions. IMPRESSION: 1. New left IJ central venous catheter with tip over the SVC. No pneumothorax. Support lines and tubes otherwise grossly stable in position 2. No significant change in extensive interstitial and alveolar disease bilaterally. Electronically Signed   By: Donavan Foil M.D.   On: 10/08/2018 14:06   Dg Chest Port 1 View  Result Date: 10/08/2018 CLINICAL DATA:  Check endotracheal tube placement EXAM: PORTABLE CHEST 1 VIEW COMPARISON:  10/07/2018 FINDINGS: Cardiac shadow is stable. Right jugular central line, gastric catheter and endotracheal tube are again seen. The endotracheal tube now lies 0.6 cm above the carina slightly withdrawn when compared with the prior exam. Persistent bilateral airspace opacities are noted worst in the right lung base stable from the previous day. Small right-sided effusion is noted superiorly. Bony abnormality is noted. IMPRESSION: Tubes and lines as described above. Stable airspace opacities bilaterally. Electronically Signed   By: Inez Catalina M.D.   On: 10/08/2018 08:43   Portable Chest X-ray  Result Date: 10/07/2018 CLINICAL DATA:  Intubation EXAM: PORTABLE CHEST 1 VIEW COMPARISON:   October 06, 2018 FINDINGS: ET tube is 3 cm above the level of carina. Enteric tube is seen coursing below the diaphragm. A right-sided PICC is seen at the superior cavoatrial junction. Patchy airspace opacities are seen throughout both lungs, right greater than left. The cardiomediastinal silhouette is unchanged. IMPRESSION: ET tube 3 cm above the carina. Unchanged patchy airspace opacities, right greater than left. Electronically Signed   By: Prudencio Pair M.D.   On: 10/07/2018 10:05   Dg Chest Port 1 View  Result Date: 10/06/2018 CLINICAL DATA:  33 year old female with centralized placement. EXAM: PORTABLE CHEST 1 VIEW COMPARISON:  Chest radiograph dated 10/05/2018 FINDINGS: Right IJ central  venous line with tip over the cavoatrial junction. No pneumothorax. Bilateral airspace opacities similar to prior radiograph. There is a small right pleural effusion. No acute osseous pathology. IMPRESSION: 1. Interval placement of a right IJ central venous line with tip in the region of the cavoatrial junction. No pneumothorax. 2. No significant interval change in the bilateral airspace opacities. Electronically Signed   By: Anner Crete M.D.   On: 10/06/2018 04:04   Dg Chest Port 1 View  Result Date: 10/02/2018 CLINICAL DATA:  Tachypnea EXAM: PORTABLE CHEST 1 VIEW COMPARISON:  Yesterday FINDINGS: Generalized reticular pulmonary opacity, present on multiple prior studies. No pleural fluid or pneumothorax. Normal heart size. IMPRESSION: Chronic reticular pulmonary opacity without acute superimposed finding. Electronically Signed   By: Monte Fantasia M.D.   On: 10/02/2018 05:45   Dg Chest Port 1 View  Result Date: 10/01/2018 CLINICAL DATA:  Cough, pancytopenia EXAM: PORTABLE CHEST 1 VIEW COMPARISON:  09/19/2018 FINDINGS: Stable cardiomediastinal contours. Extensive, diffuse reticulonodular opacities throughout both lungs, markedly progressed from prior. No pleural effusion. No pneumothorax. Osseous structures  intact. IMPRESSION: Extensive reticulonodular opacities throughout both lungs suggestive of an atypical infection in an immunocompromised patient. Electronically Signed   By: Davina Poke M.D.   On: 10/01/2018 18:38   Vas [place] Lower Extremity Venous (dvt)  Result Date: 10/07/2018  Lower Venous Study Indications: SOB, and Edema.  Risk Factors: Advanced HIV/AIDS. Limitations: Lights on in room, patient newly vented. Comparison Study: Prior study from 06/20/17 is available for comparison Performing Technologist: Sharion Dove RVS  Examination Guidelines: A complete evaluation includes B-mode imaging, spectral Doppler, color Doppler, and power Doppler as needed of all accessible portions of each vessel. Bilateral testing is considered an integral part of a complete examination. Limited examinations for reoccurring indications may be performed as noted.  +---------+---------------+---------+-----------+----------+--------------+  RIGHT     Compressibility Phasicity Spontaneity Properties Thrombus Aging  +---------+---------------+---------+-----------+----------+--------------+  CFV       Full            Yes       Yes                                    +---------+---------------+---------+-----------+----------+--------------+  SFJ       Full                                                             +---------+---------------+---------+-----------+----------+--------------+  FV Prox   Full                                                             +---------+---------------+---------+-----------+----------+--------------+  FV Mid    Full                                                             +---------+---------------+---------+-----------+----------+--------------+  FV Distal Full                                                             +---------+---------------+---------+-----------+----------+--------------+  PFV       Full                                                              +---------+---------------+---------+-----------+----------+--------------+  POP       Full            Yes       Yes                                    +---------+---------------+---------+-----------+----------+--------------+  PTV       Full                                                             +---------+---------------+---------+-----------+----------+--------------+  PERO      Full                                                             +---------+---------------+---------+-----------+----------+--------------+   +---------+---------------+---------+-----------+----------+--------------+  LEFT      Compressibility Phasicity Spontaneity Properties Thrombus Aging  +---------+---------------+---------+-----------+----------+--------------+  CFV       Full            Yes       Yes                                    +---------+---------------+---------+-----------+----------+--------------+  SFJ       Full                                                             +---------+---------------+---------+-----------+----------+--------------+  FV Prox   Full                                                             +---------+---------------+---------+-----------+----------+--------------+  FV Mid    Full                                                             +---------+---------------+---------+-----------+----------+--------------+  FV Distal Full                                                             +---------+---------------+---------+-----------+----------+--------------+  PFV       Full                                                             +---------+---------------+---------+-----------+----------+--------------+  POP       Full            Yes       Yes                                    +---------+---------------+---------+-----------+----------+--------------+  PTV       Full                                                              +---------+---------------+---------+-----------+----------+--------------+  PERO                                                       Not visualized  +---------+---------------+---------+-----------+----------+--------------+     Summary: Right: Findings appear essentially unchanged compared to previous examination. There is no evidence of deep vein thrombosis in the lower extremity. Left: Findings appear essentially unchanged compared to previous examination. There is no evidence of deep vein thrombosis in the lower extremity. However, portions of this examination were limited- see technologist comments above.  *See table(s) above for measurements and observations. Electronically signed by Monica Martinez MD on 10/07/2018 at 6:03:03 PM.    Final    Vas [place] Upper Extremity Venous Duplex  Result Date: 10/15/2018 UPPER VENOUS STUDY  Indications: Edema Limitations: Bandages and Edema and size of the veins. Comparison Study: No previous study available Performing Technologist: Toma Copier RVS  Examination Guidelines: A complete evaluation includes B-mode imaging, spectral Doppler, color Doppler, and power Doppler as needed of all accessible portions of each vessel. Bilateral testing is considered an integral part of a complete examination. Limited examinations for reoccurring indications may be performed as noted.  Right Findings: +----------+------------+---------+-----------+----------+---------------------+  RIGHT      Compressible Phasicity Spontaneous Properties        Summary         +----------+------------+---------+-----------+----------+---------------------+  IJV                                                      Not visualized due to                                                               dialysis access     +----------+------------+---------+-----------+----------+---------------------+  Subclavian     Full  Yes        Yes                                        +----------+------------+---------+-----------+----------+---------------------+  Axillary       Full        Yes        Yes                                       +----------+------------+---------+-----------+----------+---------------------+  Brachial       Full        Yes        Yes                                       +----------+------------+---------+-----------+----------+---------------------+  Radial         Full                                                             +----------+------------+---------+-----------+----------+---------------------+  Ulnar          Full                                                             +----------+------------+---------+-----------+----------+---------------------+  Cephalic                                                 Not visualized due to                                                            edema and size of the                                                                    vein           +----------+------------+---------+-----------+----------+---------------------+  Basilic                                                  Not visualized due to  the size of the vein   +----------+------------+---------+-----------+----------+---------------------+ Technically limited due to dialysis access in the neck, size and edema.  Left Findings: +----------+------------+---------+-----------+----------+---------------------+  LEFT       Compressible Phasicity Spontaneous Properties        Summary         +----------+------------+---------+-----------+----------+---------------------+  IJV                                                      Not visualized due to                                                                IV placement       +----------+------------+---------+-----------+----------+---------------------+  Subclavian                                               Not visualized due  to                                                               bandages for IV                                                                     placement        +----------+------------+---------+-----------+----------+---------------------+  Axillary       Full        Yes        Yes                                       +----------+------------+---------+-----------+----------+---------------------+  Brachial       Full        Yes        Yes                                       +----------+------------+---------+-----------+----------+---------------------+  Radial         Full                                                             +----------+------------+---------+-----------+----------+---------------------+  Ulnar          Full                                                             +----------+------------+---------+-----------+----------+---------------------+  Cephalic                                                 Not visualize due to                                                                size and edema      +----------+------------+---------+-----------+----------+---------------------+  Basilic                                                  Not visualized due to                                                               size and edema      +----------+------------+---------+-----------+----------+---------------------+ Technically limited due to bandages, IV placement, size. and edema  Summary:  Right: No evidence of deep vein thrombosis in the upper extremity. However, unable to visualize all veins. See comments listed above. No evidence of superficial vein thrombosis . However, unable to visualize See comments listed above. No evidence of thrombosis in the subclavian. This was a limited study.  Left: No evidence of deep vein thrombosis in the upper extremity. However, unable to visualize all veins. See comments listed avone. No evidence of superficial vein thrombosis .  However, unable to visualize all veins. See comments listed avone. Unable to visualize the subclavian/ See comments listed abone. This was a limited study.  *See table(s) above for measurements and observations.  Diagnosing physician: Servando Snare MD Electronically signed by Servando Snare MD on 10/15/2018 at 1:15:33 PM.    Final     Time Spent in minutes  30   [name] M.D on 10/17/2018 at 6:38 AM  Between 7am to 7pm - Pager - [phone]    After 7pm go to [email or web address] - password [facility]  [facility] -  Office  [phone]

## 2018-10-17 NOTE — Progress Notes (Signed)
Paged Dr. Maudie Mercury about patient needing a PICC line to replace the right IJ. The right IJ has been in 9 days.

## 2018-10-18 ENCOUNTER — Inpatient Hospital Stay: Payer: Self-pay

## 2018-10-18 ENCOUNTER — Inpatient Hospital Stay (HOSPITAL_COMMUNITY): Payer: 59

## 2018-10-18 LAB — MAGNESIUM: Magnesium: 1.8 mg/dL (ref 1.7–2.4)

## 2018-10-18 LAB — CBC
HCT: 25.2 % — ABNORMAL LOW (ref 36.0–46.0)
Hemoglobin: 8.1 g/dL — ABNORMAL LOW (ref 12.0–15.0)
MCH: 29.9 pg (ref 26.0–34.0)
MCHC: 32.1 g/dL (ref 30.0–36.0)
MCV: 93 fL (ref 80.0–100.0)
Platelets: 30 10*3/uL — ABNORMAL LOW (ref 150–400)
RBC: 2.71 MIL/uL — ABNORMAL LOW (ref 3.87–5.11)
RDW: 18.7 % — ABNORMAL HIGH (ref 11.5–15.5)
WBC: 13.5 10*3/uL — ABNORMAL HIGH (ref 4.0–10.5)
nRBC: 4.4 % — ABNORMAL HIGH (ref 0.0–0.2)

## 2018-10-18 LAB — HYPERSENSITIVITY PNEUMONITIS
A. Pullulans Abs: NEGATIVE
A.Fumigatus #1 Abs: NEGATIVE
Micropolyspora faeni, IgG: NEGATIVE
Pigeon Serum Abs: NEGATIVE
Thermoact. Saccharii: NEGATIVE
Thermoactinomyces vulgaris, IgG: NEGATIVE

## 2018-10-18 LAB — APTT: aPTT: 34 seconds (ref 24–36)

## 2018-10-18 LAB — RENAL FUNCTION PANEL
Albumin: 2.3 g/dL — ABNORMAL LOW (ref 3.5–5.0)
Anion gap: 13 (ref 5–15)
BUN: 78 mg/dL — ABNORMAL HIGH (ref 6–20)
CO2: 23 mmol/L (ref 22–32)
Calcium: 8.3 mg/dL — ABNORMAL LOW (ref 8.9–10.3)
Chloride: 106 mmol/L (ref 98–111)
Creatinine, Ser: 1.52 mg/dL — ABNORMAL HIGH (ref 0.44–1.00)
GFR calc Af Amer: 52 mL/min — ABNORMAL LOW (ref >60)
GFR calc non Af Amer: 45 mL/min — ABNORMAL LOW (ref >60)
Glucose, Bld: 150 mg/dL — ABNORMAL HIGH (ref 70–99)
Phosphorus: 3.4 mg/dL (ref 2.5–4.6)
Potassium: 3.7 mmol/L (ref 3.5–5.1)
Sodium: 142 mmol/L (ref 135–145)

## 2018-10-18 LAB — GLUCOSE, CAPILLARY
Glucose-Capillary: 142 mg/dL — ABNORMAL HIGH (ref 70–99)
Glucose-Capillary: 78 mg/dL (ref 70–99)
Glucose-Capillary: 79 mg/dL (ref 70–99)
Glucose-Capillary: 73 mg/dL (ref 70–99)
Glucose-Capillary: 96 mg/dL (ref 70–99)
Glucose-Capillary: 131 mg/dL — ABNORMAL HIGH (ref 70–99)
Glucose-Capillary: 255 mg/dL — ABNORMAL HIGH (ref 70–99)

## 2018-10-18 MED ORDER — PHENOL 1.4 % MT LIQD
1.0000 | OROMUCOSAL | Status: DC | PRN
  Filled 2018-10-18: qty 177

## 2018-10-18 MED ORDER — SODIUM CHLORIDE 0.9% FLUSH
10.0000 mL | Freq: Two times a day (BID) | INTRAVENOUS | Status: DC
  Administered 2018-10-18: 22:00:00 20 mL
  Administered 2018-10-19 – 2018-10-23 (×7): 10 mL

## 2018-10-18 MED ORDER — SODIUM CHLORIDE 0.9% FLUSH: 10.0000 mL | INTRAVENOUS | Status: DC | PRN

## 2018-10-18 MED ORDER — BICTEGRAVIR-EMTRICITAB-TENOFOV 50-200-25 MG PO TABS
1.0000 | ORAL_TABLET | Freq: Every day | ORAL | Status: DC
  Administered 2018-10-19 – 2018-10-23 (×5): 1 via ORAL
  Filled 2018-10-18 (×5): qty 1

## 2018-10-18 NOTE — Progress Notes (Signed)
PT Cancellation Note  Patient Details Name: Krystal Short MRN: [id number] DOB: August 24, 1985   Cancelled Treatment:    Reason Eval/Treat Not Completed: Other (comment)(RN requested PT check back later as pt is about to have PICC line placed. Will follow.)  Philomena Doheny PT 10/18/2018  Acute Rehabilitation Services Pager [phone] Office [phone]

## 2018-10-18 NOTE — Progress Notes (Signed)
Per Renal (Dr. Posey Pronto) patient can receive PICC placement.

## 2018-10-18 NOTE — Progress Notes (Addendum)
Speech therapy came to evaluate patient this AM. Gave pudding and water. Patient able to swallow AM meds with pudding with RN and Speech at bedside. Barium swallow study today. Will hold off on replacing cortrack tube until after results of barium swallow study.

## 2018-10-18 NOTE — Progress Notes (Signed)
Patient ID: Krystal Short, female   DOB: [birth year], 33 y.o.   MRN: [id number] [facility] Progress Note   Assessment/ Plan:   1. Acute kidney Injury: Suspected to be secondary to ATN versus cortical necrosis from septic shock.  Appears to be showing renal recovery with improving urine output and relatively flat BUN/creatinine.  No indications to restart renal replacement therapy and will continue to follow daily labs to decide on need for intervention.  She is slightly hypervolemic without any respiratory compromise. 2.  Acute/recurrent hypoxemic respiratory failure: With bronchoalveolar lavage showing Staphylococcus epidermidis for which he is on antimicrobial coverage.  Testing for PJP negative. 3.  Thrombocytopenia/anemia: Ongoing work-up including bone marrow biopsy recently undertaken.  Additional management per hematology.  Status post PRBC transfusion. 4.  Hypophosphatemia: Secondary to CRRT losses/GI losses, replaced via intravenous route and now within normal range.  Subjective:   Without acute events overnight, improving urine output noted.   Objective:   BP 112/84   Pulse 92   Temp 97.8 F (36.6 C) (Oral)   Resp (!) 31   Ht 5' 1" (1.549 m)   Wt 46.7 kg   LMP  (LMP Unknown) Comment: pt is on depo  SpO2 100%   BMI 19.45 kg/m   Intake/Output Summary (Last 24 hours) at 10/18/2018 1214 Last data filed at 10/18/2018 0500 Gross per 24 hour  Intake 1461.83 ml  Output 800 ml  Net 661.83 ml   Weight change: -1.9 kg  Physical Exam: Gen: Resting comfortably in bed-on oxygen via nasal cannula, mother at bedside CVS: Pulse regular rhythm, normal rate, S1 and S2 with ejection systolic murmur Resp: Diminished breath sounds over both bases without distinct rales or rhonchi Abd: Soft, flat, nontender Ext: No palpable edema over lower extremities, some trace dependent edema noted  Imaging: Ct Chest Wo Contrast  Result Date: 10/16/2018 CLINICAL DATA:  Acute respiratory  failure EXAM: CT CHEST WITHOUT CONTRAST TECHNIQUE: Multidetector CT imaging of the chest was performed following the standard protocol without IV contrast. COMPARISON:  None. FINDINGS: Cardiovascular: Heart is normal size.  Aorta is normal caliber. Mediastinum/Nodes: Enlarged mediastinal lymph nodes. Index right paratracheal node has a short axis diameter of 11 mm. These are stable since prior study. No axillary adenopathy. Difficult to assess the hila without intravenous contrast. Lungs/Pleura: Extensive interstitial thickening, ground-glass opacities. More confluent consolidation seen in the left upper lobe and both lower lobes. Trace bilateral effusions. Upper Abdomen: Imaging into the upper abdomen shows no acute findings. Probable splenomegaly. The spleen is not imaged in its entirety. Musculoskeletal: Chest wall soft tissues are unremarkable. No acute bony abnormality. IMPRESSION: Diffuse ground-glass airspace disease throughout the lungs with interlobular septal thickening. More confluent consolidation in the left upper lobe and both superior segments of the lower lobes. This is similar to prior study and presumably reflects atypical infection there is mediastinal adenopathy which is also stable. Sarcoidosis could have a similar appearance in the appropriate clinical setting. Trace bilateral effusions. Probable splenomegaly. The spleen is not imaged in its entirety but appears enlarged in the visualized upper abdomen. Recommend clinical correlation. Electronically Signed   By: Rolm Baptise M.D.   On: 10/16/2018 19:05   Dg Swallowing Func-speech Pathology  Result Date: 10/18/2018 Objective Swallowing Evaluation: Type of Study: MBS-Modified Barium Swallow Study  Patient Details Name: Krystal Short MRN: [id number] Date of Birth: 1985-10-15 Today's Date: 10/18/2018 Time: SLP Start Time (ACUTE ONLY): 1000 -SLP Stop Time (ACUTE ONLY): 1015 SLP Time Calculation (min) (ACUTE ONLY):  15 min Past Medical History: Past  Medical History: Diagnosis Date . Acute hyponatremia 06/03/2017 . Anemia  . CAP (community acquired pneumonia) 06/03/2017 . Facial dermatitis 06/03/2017 . HIV (human immunodeficiency virus infection) ([place])   diagnosed in April 2019 . Pleurisy 06/03/2017 . 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 ([place])  . Thrush of mouth and esophagus ([place])  . UTI (urinary tract infection)  Past Surgical History: Past Surgical History: Procedure Laterality Date . BIOPSY  07/12/2018  Procedure: BIOPSY;  Surgeon: Laurin Coder, MD;  Location: [facility];  Service: Endoscopy;; . BRONCHIAL WASHINGS  07/12/2018  Procedure: BRONCHIAL WASHINGS;  Surgeon: Laurin Coder, MD;  Location: [facility];  Service: Endoscopy;; . ENDOBRONCHIAL ULTRASOUND N/A 07/12/2018  Procedure: ENDOBRONCHIAL ULTRASOUND;  Surgeon: Laurin Coder, MD;  Location: [facility];  Service: Endoscopy;  Laterality: N/A; . FINE NEEDLE ASPIRATION BIOPSY  07/12/2018  Procedure: FINE NEEDLE ASPIRATION BIOPSY;  Surgeon: Laurin Coder, MD;  Location: [facility];  Service: Endoscopy;; . NO PAST SURGERIES   . VIDEO BRONCHOSCOPY N/A 07/12/2018  Procedure: VIDEO BRONCHOSCOPY WITHOUT FLUORO;  Surgeon: Laurin Coder, MD;  Location: [facility];  Service: Endoscopy;  Laterality: N/A; HPI: 33 yo F with history of HIV/AIDS admitted on 8/29 with progressive shortness of breath. History of multiple admissions over the last year for waxing / waning pulmonary infiltrates. Patient decompensated on 8/31 requiring initiation of mechanical intubation and later pressure support and CRRT, was extubated 10/14/2018.  Swallow eval ordered.  Pt denies h/o dysphagia.  .Pt was taken off CRRT yesterday.  Today is tachycardic.  She reports she is receiving ice chips and her spouse gave her "lots of them" yesterday.  Pt had a small bore feeding tube for nutrition, however, this was found out thursday 10/17/2018 night. Pt to have MBS today.  Subjective: Pt seen in radiology for  MBS. RN in attendance. Assessment / Plan / Recommendation CHL IP CLINICAL IMPRESSIONS 10/18/2018 Clinical Impression Pt presents with essentially normal oropharyngeal swallow function. One episode of very trace flash penetration was noted with large straw sips, however, penetrate cleared spontaneously, and no other episodes of penetration were noted. Otherwise, normal swallow without oral residue, post-swallow pharyngeal residue, or aspiration noted. Recommend beginning regular texture solids and thin liquids, meds whole with liquid. Safe swallow precautions posted at [facility] and reviewed with pt and her mother. SLP will follow for diet tolerance and education.  SLP Visit Diagnosis Dysphagia, oropharyngeal phase (R13.12)     Impact on safety and function Mild aspiration risk   CHL IP TREATMENT RECOMMENDATION 10/18/2018 Treatment Recommendations Therapy as outlined in treatment plan below   Prognosis 10/15/2018 Prognosis for Safe Diet Advancement Good     CHL IP DIET RECOMMENDATION 10/18/2018 SLP Diet Recommendations Regular solids Liquid Administration via Straw;Cup Medication Administration Whole meds with liquid Compensations Slow rate;Small sips/bites;Minimize environmental distractions Postural Changes Seated upright at 90 degrees   CHL IP OTHER RECOMMENDATIONS 10/18/2018 Recommended Consults -- Oral Care Recommendations Oral care QID Other Recommendations --   CHL IP FOLLOW UP RECOMMENDATIONS 10/18/2018 Follow up Recommendations None   CHL IP FREQUENCY AND DURATION 10/18/2018 Speech Therapy Frequency (ACUTE ONLY) min 1 x/week Treatment Duration 1 week;2 weeks      CHL IP ORAL PHASE 10/18/2018 Oral Phase WFL  CHL IP PHARYNGEAL PHASE 10/18/2018 Pharyngeal Phase Impaired Pharyngeal- Nectar Cup WFL Pharyngeal- Thin Cup Reduced airway/laryngeal closure;Penetration/Aspiration during swallow Pharyngeal Material enters airway, remains ABOVE vocal cords then ejected out;Material does not enter airway Pharyngeal- Puree  WFL Pharyngeal-  Regular WFL  CHL IP CERVICAL ESOPHAGEAL PHASE 10/18/2018 Cervical Esophageal Phase [facility] B. Quentin [facility], CCC-SLP Speech Language Pathologist [phone] [name] 10/18/2018, 10:40 AM              [place] Ekg Site Rite  Result Date: 10/18/2018 If [facility] image not attached, placement could not be confirmed due to current cardiac rhythm.   Labs: BMET Recent Labs  Lab 10/14/18 0404 10/14/18 1708 10/15/18 0330 10/15/18 1732 10/16/18 0350 10/17/18 0423 10/18/18 0425  NA 136 136 137  137 136 134* 138 142  K 3.7 4.2 3.9  4.0 3.8 4.2 4.4 3.7  CL 101 100 102  103 101 101 104 106  CO2 _0 GLUCOSE 162* 282* 102*  100* 177* 173* 123* 150*  BUN 35* 35* 27*  28* 27* 31* 58* 78*  CREATININE 0.95 1.00 1.06*  1.13* 1.04* 1.09* 1.57* 1.52*  CALCIUM 7.6* 7.4* 7.7*  7.6* 7.9* 7.7* 7.3* 8.3*  PHOS 2.4*  2.5 9.6* 3.6  3.6 2.3* 2.4* 2.6 3.4   CBC Recent Labs  Lab 10/12/18 0407  10/15/18 0330 10/16/18 0725 10/17/18 0423 10/17/18 1500 10/18/18 0425  WBC 17.8*   < > 25.3* 16.8* 16.5*  --  13.5*  NEUTROABS 13.1*  --   --  13.4* 13.5*  --   --   HGB 7.6*   < > 8.1* 7.8* 6.1* 8.1* 8.1*  HCT 23.3*   < > 25.0* 24.6* 19.3* 25.1* 25.2*  MCV 92.1   < > 93.6 96.5 97.5  --  93.0  PLT 11*   < > 16* 21* 26*  --  30*   < > = values in this interval not displayed.    Medications:    . atovaquone  1,500 mg Oral Daily  . azithromycin  1,200 mg Oral Q Tue  . benzonatate  100 mg Oral TID  . chlorhexidine  15 mL Mouth Rinse BID  . Chlorhexidine Gluconate Cloth  6 each Topical Daily  . dexamethasone  16 mg Intravenous Q24H  . emtricitabine-tenofovir AF  1 tablet Per Tube Daily   And  . dolutegravir  50 mg Oral Daily  . feeding supplement (PRO-STAT SUGAR FREE 64)  30 mL Per Tube QID  . guaiFENesin-dextromethorphan  7.5 mL Per Tube Q6H  . insulin aspart  2-6 Units Subcutaneous Q4H  . lidocaine  1 patch Transdermal Q24H  . mouth rinse  15 mL Mouth Rinse q12n4p  .  mometasone-formoterol  2 puff Inhalation BID  . pantoprazole sodium  40 mg Per Tube Daily  . romiPLOStim  5 mcg/kg Subcutaneous Weekly  . sodium chloride flush  10-40 mL Intracatheter Q12H   Elmarie Shiley, MD 10/18/2018, 12:14 PM

## 2018-10-18 NOTE — Progress Notes (Signed)
Modified Barium Swallow Progress Note  Patient Details  Name: Krystal Short MRN: [id number] Date of Birth: Oct 29, 1985  Today's Date: 10/18/2018  Modified Barium Swallow completed.  Full report located under Chart Review in the Imaging Section.  Brief recommendations include the following:  Clinical Impression  Pt presents with essentially normal oropharyngeal swallow function. One episode of very trace flash penetration was noted with large straw sips, however, penetrate cleared spontaneously, and no other episodes of penetration were noted. Otherwise, normal swallow without oral residue, post-swallow pharyngeal residue, or aspiration noted. Recommend beginning regular texture solids and thin liquids, meds whole with liquid. Safe swallow precautions posted at [facility] and reviewed with pt and her mother. SLP will follow for diet tolerance and education.    Swallow Evaluation Recommendations  SLP Diet Recommendations: Regular solids   Liquid Administration via: Straw;Cup   Medication Administration: Whole meds with liquid   Supervision: Patient able to self feed;Staff to assist with self feeding;Full supervision/cueing for compensatory strategies   Compensations: Slow rate;Small sips/bites;Minimize environmental distractions   Postural Changes: Seated upright at 90 degrees   Oral Care Recommendations: Oral care QID     Celia B. Quentin Ore [facility], [place] Speech Language Pathologist [phone]   [name] 10/18/2018,10:42 AM

## 2018-10-18 NOTE — Progress Notes (Signed)
Patient ID: Krystal Short, female   DOB: 1986/01/30, 33 y.o.   MRN: [id number]                                                                PROGRESS NOTE                                                                                                                                                                                                             Patient Demographics:    Krystal Short, is a 33 y.o. female, DOB - 1985/08/13, FGH:[id number]  Admit date - 10/05/2018   Admitting Physician Krystal Rhymes, MD  Outpatient Primary MD for the patient is Krystal Manes, MD  LOS - 12  Outpatient Specialists:     Chief Complaint  Patient presents with   Shortness of Breath       Brief Narrative  Patient is an unfortunate 33 year old female history of HIV/AIDS, chronic anemia/thrombocytopenia requiring multiple transfusions in the past, who was admitted on 10/05/2018 with progressive shortness of breath and acute recurrent hypoxemic respiratory failure. Patient noted have been treated for PGP pneumonia, bacterial pneumonia and with steroids in the past. Patient noted to have some bone marrow suppression as well as platelets of just 9 which trended down from 16 early on in the month. Patient underwent a bone marrow biopsy, BAL. Patient decompensated during the hospitalization and on 10/07/2018 required initiation of mechanical ventilation with pressure support requiring pressors and CRRT. Patient noted to be having metabolic acidosis, acute kidney injury and subsequently anuric. Nephrology consulted, ID consulted and are following. Patient on PCCM service. Patient extubated 10/14/2018 and patient transferred to hospitalist service. PCCM following along.  Recent Evaluation  03/08/18-03/10/18 Acute respiratory failure secondary to pneumonia (rhinovirus). tx with rocephin/ zithromax, Bactrim for PCP. Macrocytic anemia   04/22/2018- Flu A infection. Treated with tamiflu as  outpatient  05/28/18- 05/31/18 admitted with respiratory failure, lung infiltrates. Treated with Bactrim, prednisone with improvement  06/14/2018-07/16/2018 Bordetella bronchoseptica infection. Underwent FOB 07/12/2018. PJP test not done as BAL was lost.Biopsy scant lung parenchyma with fibrosis and reactive changes. Required 4 PRBC and 3 platelet infusion.  07/31/2018-08/22/2018 sepsis from suspected PJP pneumonia. Treated with steroids clindamycin and primaquine. Required 8 PRBC and 7 platelet transfusion. RVP positive for routine coronavirus.  09/03/2018-09/13/2018 generalized weakness  and deconditioning. Required 3 PRBC 8 platelet transfusion.  7/30/2020IVIGwithout any improvement in platelet count. Given prolonged prednisone taper.  09/11/18 Bone marrowBx all micro studies neg   10/01/2018-10/04/2018 presented with anemia and poor IV access. Given 2 PRBC 1 platelet transfusion. Required pressors. Treated with ceftriaxone and doxy for ? CAP.    Subjective:    Krystal Short today has pulled out her cortrac overnite.  ? Slight confusion.  Per RN, scheduled to be replaced this am.  Afebrile,  Hr improved. Pox low, but doesn't appear to be picking up on finger per RN, and pt declined to use o2 [place].  Overall patient feels like she is doing better.   No headache, No chest pain, No abdominal pain - No Nausea, No new weakness tingling or numbness, No Cough - SOB.    Assessment  & Plan :    Active Problems:   HIV disease (Krystal Short)   Molluscum contagiosum   Symptomatic anemia   Thrombocytopenia (HCC)   ASCUS with positive high risk HPV cervical   Pulmonary infiltrates   Lymphadenopathy, generalized   Acute respiratory failure with hypoxemia (HCC)   AKI (acute kidney injury) (HCC)   Elevated bilirubin   Anemia   Elevated liver enzymes   AIDS (acquired immune deficiency syndrome) (HCC)   ARDS (adult respiratory distress syndrome) (HCC)   [place] (hemophagocytic lymphohistiocytosis) ([place])    Compensated metabolic acidosis   History of ETT   Acute metabolic encephalopathy   Hypophosphatemia   Protein-calorie malnutrition, moderate ([place])   1. Acute recurrent respiratory failure with hypoxemia/recurrent bilateralpulmonary infiltrates Questionable etiology. Felt to be secondary to acute lung injury/hypersensitivity pneumonitis with questionable etiology. Patient noted to have recurrent bilateral pulmonary infiltrates. Patient intubated and recently extubated on 10/14/2018.  BAL done positive for staph epidermidis. PGP was negative. Blood cultures with no growth to date. Patient received a days worth of Bactrim. Received IV Maxipime from 10/05/2018>>>10/12/2018. Patient now on oral azithromycin per ID recommendations. Patient on 3 L nasal cannula. BiPAP as needed. Patient being followed by ID.Pulmonary following.  2. Profound persistent thrombocytopenia/anemia Hemoglobin currently at 7.8. Platelet count at 21,000. Patient noted to have had extensive work-up by heme-onc with bone marrow biopsy with a normal bone marrow. HIT antibody was negative. M AHA negative. Patient initially started on steroids for possible sarcoidosis but no granulomas noted on prior biopsies. Patient is which have been tapered off. Patient status post transfusion of 6 units of platelets and 3 units of packed red blood cells. Transfusion threshold hemoglobin less than 7 and platelet count less than 20,000. Patient to be restarted on weekly Nplate to help boost her platelets per hematology. Patient was on Decadron 20 mg daily which ended on 10/14/2018. Will defer resumption of steroids to hematology and PCCM.  Transfuse 2 units prbc for Hgb 6.1 (10/17/2018) -> 8.1 (10/18/2018) check cbc in am    3. Acute kidney injury/metabolic acidosis/hyperkalemia/hypocalcemia Felt secondary to ATN from septic shock. Patient anuric. Patient with soft borderline blood pressures however currently off pressors.  Patient prev on  CRRT. CRRT discontinued on 9/9   Nephrology following pt is now making some urine. Further Rec per nephrology.  4. Hypophosphatemia Felt secondary to CRRT losses/GI losses. Per nephrology.  5. HIV/AIDS On Biktarvy. Continue atovaquone for PCP prophylaxis and azithromycin for MAI prophylaxis. Per ID.  6. Concern for secondary [facility] Per hematology and PCCM.  7. Acute metabolic encephalopathy In the setting of advanced HIV/AIDS acute respiratory failure and acute kidney injury with profound  anemia and thrombocytopenia. Head CT negative. Improved.  8. Moderate protein calorie malnutrition Patient started on tube feeds.  9. Transaminitis Slowly improving.  10. Hyperglycemia Transitioned from insuling GTT to SSI (9/10)  11. Hypocalcemia (corrected calcium low) Calcium gluconate 1gm iv x1 (9/10) Check renal function panel in am  12.  Deconditioning PT to evaluate and tx (9/11)  DVT prophylaxis:Prevalon boots Code Status:Full Family Communication:Updated patient. No family at bedside. Disposition Plan:Remain in the ICU.   Consultants: PCCM 8/30 ID 8/30 Hem/Onc 8/30 Nephrology 9/1   Procedures: R IJ CVC 8/30 >> ETT 8/31 >>10/14/2018 FOB 8/31 >> L HD cath 9/1 >>  Significant Diagnostic Tests:  Head CT 8/31 >> negative for any acute intracranial process Renal ultrasound 9/2 >> No hydronephrosis, medical renal disease  Chest x-ray 9/6-improving pulmonary infiltrates.   CT chest 10/16/2018  IMPRESSION: Diffuse ground-glass airspace disease throughout the lungs with interlobular septal thickening. More confluent consolidation in the left upper lobe and both superior segments of the lower lobes. This is similar to prior study and presumably reflects atypical infection there is mediastinal adenopathy which is also stable. Sarcoidosis could have a similar appearance in the appropriate clinical setting.Trace bilateral  effusions.     Probable splenomegaly. The spleen is notimaged in its entirety but appears enlarged in the visualized upper abdomen. Recommend clinical correlation.  Significant Hospital Events   8/29 Admit  8/30 PCCM consulted,started solumedrol 8/30 am @ 80 mg q 12h 8/31 Intubated, FOB 9/1 CRRT 9/4 High dose decadron 20 mg IV qd started by heme due to concern of secondary [place]. Send soluble CD 25/ IL2Ra levels 9/5 Weaning pressors, starting PSV weans, improving CXR 9/7 -Awake on the vent. No acute events.Weaning on pressure support 9/9 CRRT discontinued  Antimicrobials: Bactrim 8/29 >>8/30 Maxepime 8/29 >> 9/5 PO Atovaquone 8/30 >> PO Azithromycin 9/1 >>        Lab Results  Component Value Date   PLT 30 (L) 10/18/2018       Anti-infectives (From admission, onward)   Start     Dose/Rate Route Frequency Ordered Stop   10/09/18 1000  emtricitabine-tenofovir AF (DESCOVY) 200-25 MG per tablet 1 tablet     1 tablet Per Tube Daily 10/08/18 1448     10/09/18 1000  dolutegravir (TIVICAY) tablet 50 mg     50 mg Oral Daily 10/08/18 1448     10/09/18 0600  ceFEPIme (MAXIPIME) 2 g in sodium chloride 0.9 % 100 mL IVPB  Status:  Discontinued     2 g 200 mL/hr over 30 Minutes Intravenous Every 24 hours 10/08/18 0740 10/08/18 1450   10/08/18 1800  ceFEPIme (MAXIPIME) 2 g in sodium chloride 0.9 % 100 mL IVPB     2 g 200 mL/hr over 30 Minutes Intravenous Every 12 hours 10/08/18 1450 10/12/18 1746   10/08/18 1000  azithromycin (ZITHROMAX) tablet 1,200 mg     1,200 mg Oral Every Tue 10/06/18 0520     10/06/18 1800  ceFEPIme (MAXIPIME) 2 g in sodium chloride 0.9 % 100 mL IVPB  Status:  Discontinued     2 g 200 mL/hr over 30 Minutes Intravenous Every 12 hours 10/06/18 0632 10/08/18 0740   10/06/18 1000  atovaquone (MEPRON) 750 MG/5ML suspension 1,500 mg     1,500 mg Oral Daily 10/06/18 0520     10/06/18 1000  bictegravir-emtricitabine-tenofovir AF (BIKTARVY) 50-200-25 MG  per tablet 1 tablet  Status:  Discontinued     1 tablet Oral Daily 10/06/18 0520 10/08/18  1448   10/06/18 0045  ceFEPIme (MAXIPIME) 2 g in sodium chloride 0.9 % 100 mL IVPB  Status:  Discontinued     2 g 200 mL/hr over 30 Minutes Intravenous Every 8 hours 10/06/18 0032 10/06/18 0632   10/06/18 0030  sulfamethoxazole-trimethoprim (BACTRIM) 260 mg of trimethoprim in dextrose 5 % 250 mL IVPB  Status:  Discontinued     260 mg of trimethoprim 266.3 mL/hr over 60 Minutes Intravenous Every 8 hours 10/06/18 0029 10/06/18 0801        Objective:   Vitals:   10/18/18 0300 10/18/18 0350 10/18/18 0400 10/18/18 0500  BP: 121/76  111/76 114/66  Pulse: 78  97 70  Resp: (!) 21  (!) 27 (!) 27  Temp:  98.9 F (37.2 C)    TempSrc:  Axillary    SpO2: 100%  97% 95%  Weight:      Height:        Wt Readings from Last 3 Encounters:  10/18/18 46.7 kg  10/04/18 49.3 kg  09/24/18 50.3 kg     Intake/Output Summary (Last 24 hours) at 10/18/2018 0602 Last data filed at 10/18/2018 0500 Gross per 24 hour  Intake 2430.12 ml  Output 1050 ml  Net 1380.12 ml     Physical Exam  Awake Alert, Oriented X 3, No new F.N deficits, Normal affect [place].AT,PERRAL Supple Neck,No JVD, No cervical lymphadenopathy appriciated.  Symmetrical Chest wall movement, Good air movement bilaterally, CTAB RRR,No Gallops,Rubs or new Murmurs, No Parasternal Heave +ve B.Sounds, Abd Soft, No tenderness, No organomegaly appriciated, No rebound - guarding or rigidity. No Cyanosis, Clubbing or edema, No new Rash or bruise   cortrac out    Data Review:    CBC Recent Labs  Lab 10/12/18 0407  10/14/18 0404 10/15/18 0330 10/16/18 0725 10/17/18 0423 10/17/18 1500 10/18/18 0425  WBC 17.8*   < > 21.0* 25.3* 16.8* 16.5*  --  13.5*  HGB 7.6*   < > 8.8* 8.1* 7.8* 6.1* 8.1* 8.1*  HCT 23.3*   < > 26.4* 25.0* 24.6* 19.3* 25.1* 25.2*  PLT 11*   < > 13* 16* 21* 26*  --  30*  MCV 92.1   < > 91.7 93.6 96.5 97.5  --  93.0  MCH 30.0    < > 30.6 30.3 30.6 30.8  --  29.9  MCHC 32.6   < > 33.3 32.4 31.7 31.6  --  32.1  RDW 17.3*   < > 16.6* 17.4* 18.6* 19.1*  --  18.7*  LYMPHSABS 1.5  --   --   --  1.0 1.2  --   --   MONOABS 0.6  --   --   --  0.7 0.9  --   --   EOSABS 0.0  --   --   --  0.0 0.0  --   --   BASOSABS 0.1  --   --   --  0.1 0.0  --   --    < > = values in this interval not displayed.    Chemistries  Recent Labs  Lab 10/11/18 1227  10/12/18 0910  10/13/18 0939  10/14/18 0404  10/15/18 0330 10/15/18 1732 10/16/18 0350 10/17/18 0423 10/18/18 0425  NA  --    < > 138   < > 137   < > 136   < > 137   137 136 134* 138 142  K  --    < > 3.4*   < >  3.4*   < > 3.7   < > 3.9   4.0 3.8 4.2 4.4 3.7  CL  --    < > 102   < > 104   < > 101   < > 102   103 101 101 104 106  CO2  --    < > 25   < > 23   < > 25   < > _0 GLUCOSE  --    < > 179*   < > 187*   < > 162*   < > 102*   100* 177* 173* 123* 150*  BUN  --    < > 27*   < > 31*   < > 35*   < > 27*   28* 27* 31* 58* 78*  CREATININE  --    < > 0.90   < > 0.96   < > 0.95   < > 1.06*   1.13* 1.04* 1.09* 1.57* 1.52*  CALCIUM  --    < > 7.5*   < > 7.6*   < > 7.6*   < > 7.7*   7.6* 7.9* 7.7* 7.3* 8.3*  MG  --    < >  --    < >  --   --  2.4  --  2.4  --  2.5* 2.0 1.8  AST 56*  --  57*  --  59*  --   --   --   --   --  58*  --   --   ALT 21  --  25  --  32  --   --   --   --   --  35  --   --   ALKPHOS 374*  --  406*  --  442*  --   --   --   --   --  370*  --   --   BILITOT 3.8*  --  3.4*  --  3.4*  --   --   --   --   --  2.5*  --   --    < > = values in this interval not displayed.   ------------------------------------------------------------------------------------------------------------------ No results for input(s): CHOL, HDL, LDLCALC, TRIG, CHOLHDL, LDLDIRECT in the last 72 hours.  Lab Results  Component Value Date   HGBA1C 5.5 08/09/2018    ------------------------------------------------------------------------------------------------------------------ No results for input(s): TSH, T4TOTAL, T3FREE, THYROIDAB in the last 72 hours.  Invalid input(s): FREET3 ------------------------------------------------------------------------------------------------------------------ No results for input(s): VITAMINB12, FOLATE, FERRITIN, TIBC, IRON, RETICCTPCT in the last 72 hours.  Coagulation profile Recent Labs  Lab 10/11/18 1227  INR 1.3*    No results for input(s): DDIMER in the last 72 hours.  Cardiac Enzymes No results for input(s): CKMB, TROPONINI, MYOGLOBIN in the last 168 hours.  Invalid input(s): CK ------------------------------------------------------------------------------------------------------------------    Component Value Date/Time   BNP 279.0 (H) 10/02/2018 1112    Inpatient Medications  Scheduled Meds:  atovaquone  1,500 mg Oral Daily   azithromycin  1,200 mg Oral Q Tue   benzonatate  100 mg Oral TID   chlorhexidine  15 mL Mouth Rinse BID   Chlorhexidine Gluconate Cloth  6 each Topical Daily   dexamethasone  16 mg Intravenous Q24H   emtricitabine-tenofovir AF  1 tablet Per Tube Daily   And   dolutegravir  50 mg Oral Daily   feeding supplement (PRO-STAT SUGAR FREE 64)  30 mL Per Tube QID   guaiFENesin-dextromethorphan  7.5 mL Per Tube Q6H   insulin aspart  2-6 Units Subcutaneous Q4H   lidocaine  1 patch Transdermal Q24H   mouth rinse  15 mL Mouth Rinse q12n4p   mometasone-formoterol  2 puff Inhalation BID   pantoprazole sodium  40 mg Per Tube Daily   romiPLOStim  5 mcg/kg Subcutaneous Weekly   sodium chloride flush  10-40 mL Intracatheter Q12H   Continuous Infusions:   prismasol BGK 4/2.5 Stopped (10/16/18 1430)    prismasol BGK 4/2.5 200 mL/hr at 10/15/18 2330   sodium chloride 10 mL/hr at 10/11/18 1737   sodium chloride Stopped (10/18/18 0000)   anticoagulant sodium  citrate     feeding supplement (VITAL 1.5 CAL) Stopped (10/18/18 0430)   prismasol BGK 4/2.5 1,000 mL/hr at 10/16/18 1257   PRN Meds:.sodium chloride, Place/Maintain arterial line **AND** sodium chloride, acetaminophen (TYLENOL) oral liquid 160 mg/5 mL, albuterol, alteplase, anticoagulant sodium citrate, fentaNYL (SUBLIMAZE) injection, midazolam, sodium chloride, sodium chloride flush  Micro Results No results found for this or any previous visit (from the past 240 hour(s)).  Radiology Reports Dg Chest 2 View  Result Date: 10/05/2018 CLINICAL DATA:  Suspected sepsis EXAM: CHEST - 2 VIEW COMPARISON:  10/02/2018, 10/01/2018, 09/19/2018, 05/28/2018, 06/03/2017, 03/11/2013, 10/15/2017, 07/12/2018 FINDINGS: Small right-sided pleural effusion. Underlying reticular interstitial changes suggesting component of chronic disease, however there is interim worsening of bilateral right greater than left airspace disease, suspicious for acute edema or pneumonia superimposed on chronic underlying disease. Stable cardiomediastinal silhouette. No pneumothorax. IMPRESSION: 1. Interval worsening of bilateral right greater than left airspace disease, suspicious for acute edema or pneumonia superimposed on underlying chronic disease. Small right-sided pleural effusion. Electronically Signed   By: Donavan Foil M.D.   On: 10/05/2018 23:52   Dg Chest 2 View  Result Date: 09/19/2018 CLINICAL DATA:  Hypotensive, recent pneumonia EXAM: CHEST - 2 VIEW COMPARISON:  Chest radiograph 08/26/2018 FINDINGS: Heart size within normal limits. Ill-defined and subtly nodular pulmonary opacities are again demonstrated, more prominent within the right lung. Findings are similar to prior chest radiograph 09/03/2018 and suspicious for infection. No evidence of pneumothorax or pleural effusion. No acute bony abnormality. IMPRESSION: Ill-defined and subtly nodular pulmonary opacities are similar to prior exam 08/26/2018 and suspicious for  infection, suspected atypical pneumonia. Electronically Signed   By: Kellie Simmering   On: 09/19/2018 13:33   Dg Abd 1 View  Result Date: 10/15/2018 CLINICAL DATA:  Check feeding tube placement EXAM: ABDOMEN - 1 VIEW COMPARISON:  10/07/2018 FINDINGS: Nasogastric catheter has been removed. A weighted feeding catheter is now seen within the mid stomach. Scattered large and small bowel gas is noted. No bony abnormality is seen. IMPRESSION: Feeding catheter within the mid stomach. Electronically Signed   By: Inez Catalina M.D.   On: 10/15/2018 14:05   Dg Abd 1 View  Result Date: 10/07/2018 CLINICAL DATA:  Abdominal distention. OG tube placement. EXAM: ABDOMEN - 1 VIEW COMPARISON:  CT scan of the abdomen dated 08/02/2018 FINDINGS: There is gaseous distention of the stomach. The tip of the OG tube lies in the distal stomach. No dilated large or small bowel. Hepatosplenomegaly. Infiltrates at the lung bases. Bones are normal. IMPRESSION: OG tube tip lies in the distal stomach. Gaseous distention of the stomach. Electronically Signed   By: Lorriane Shire M.D.   On: 10/07/2018 09:58   Ct Head Wo Contrast  Result Date: 10/07/2018 CLINICAL DATA:  Altered mental status, intubation, HIV  EXAM: CT HEAD WITHOUT CONTRAST TECHNIQUE: Contiguous axial images were obtained from the base of the skull through the vertex without intravenous contrast. COMPARISON:  None. FINDINGS: Brain: No evidence of acute infarction, hemorrhage, hydrocephalus, extra-axial collection or mass lesion/mass effect. Vascular: No hyperdense vessel or unexpected calcification. Skull: Normal. Negative for fracture or focal lesion. Sinuses/Orbits: No acute finding. Other: None. IMPRESSION: No acute intracranial pathology. Electronically Signed   By: Eddie Candle M.D.   On: 10/07/2018 16:28   Ct Chest Wo Contrast  Result Date: 10/16/2018 CLINICAL DATA:  Acute respiratory failure EXAM: CT CHEST WITHOUT CONTRAST TECHNIQUE: Multidetector CT imaging of the  chest was performed following the standard protocol without IV contrast. COMPARISON:  None. FINDINGS: Cardiovascular: Heart is normal size.  Aorta is normal caliber. Mediastinum/Nodes: Enlarged mediastinal lymph nodes. Index right paratracheal node has a short axis diameter of 11 mm. These are stable since prior study. No axillary adenopathy. Difficult to assess the hila without intravenous contrast. Lungs/Pleura: Extensive interstitial thickening, ground-glass opacities. More confluent consolidation seen in the left upper lobe and both lower lobes. Trace bilateral effusions. Upper Abdomen: Imaging into the upper abdomen shows no acute findings. Probable splenomegaly. The spleen is not imaged in its entirety. Musculoskeletal: Chest wall soft tissues are unremarkable. No acute bony abnormality. IMPRESSION: Diffuse ground-glass airspace disease throughout the lungs with interlobular septal thickening. More confluent consolidation in the left upper lobe and both superior segments of the lower lobes. This is similar to prior study and presumably reflects atypical infection there is mediastinal adenopathy which is also stable. Sarcoidosis could have a similar appearance in the appropriate clinical setting. Trace bilateral effusions. Probable splenomegaly. The spleen is not imaged in its entirety but appears enlarged in the visualized upper abdomen. Recommend clinical correlation. Electronically Signed   By: Rolm Baptise M.D.   On: 10/16/2018 19:05   US Renal  Result Date: 10/09/2018 CLINICAL DATA:  Acute kidney injury. EXAM: RENAL / URINARY TRACT ULTRASOUND COMPLETE COMPARISON:  08/08/2018 FINDINGS: Right Kidney: Renal measurements: 11.2 x 4.4 x 5.8 cm = volume: 149 mL. Echogenicity is diffusely increased. No hydronephrosis. Left Kidney: Renal measurements: 12.4 x 5.5 x 4.6 cm = volume: 163 mL. Diffusely increased echogenicity without hydronephrosis. Bladder: Decompressed by Foley catheter. Note: Small to moderate  bilateral pleural effusions noted. IMPRESSION: 1. Bilateral echogenic kidneys without hydronephrosis. Imaging features compatible with medical renal disease. 2. Bilateral pleural effusions. Electronically Signed   By: Misty Stanley M.D.   On: 10/09/2018 20:03   Dg Chest Port 1 View  Result Date: 10/15/2018 CLINICAL DATA:  Hypoxia. EXAM: PORTABLE CHEST 1 VIEW COMPARISON:  Radiograph of October 14, 2018. FINDINGS: The heart size and mediastinal contours are within normal limits. Endotracheal and nasogastric tubes have been removed. Bilateral internal jugular catheters are unchanged in position. Stable bilateral bibasilar opacities are noted concerning for possible atelectasis or inflammation. The visualized skeletal structures are unremarkable. IMPRESSION: Endotracheal and nasogastric tubes have been removed. Stable bilateral lung opacities as described above. Electronically Signed   By: Marijo Conception M.D.   On: 10/15/2018 07:08   Dg Chest Port 1 View  Result Date: 10/14/2018 CLINICAL DATA:  resp failure EXAM: PORTABLE CHEST 1 VIEW COMPARISON:  10/13/2018 FINDINGS: Endotracheal tube is in place, tip 4.8 centimeters above the carina. RIGHT IJ central line tip overlies the superior vena cava. LEFT IJ central line tip overlies the superior vena cava. Patient is rotated towards the LEFT. Nasogastric tube tip is off the image. The heart is  normal in size. Diffuse ground-glass opacities are identified throughout the lungs bilaterally without significant change. No new consolidation. IMPRESSION: Stable bilateral pulmonary infiltrates. Electronically Signed   By: Nolon Nations M.D.   On: 10/14/2018 09:03   Dg Chest Port 1 View  Result Date: 10/13/2018 CLINICAL DATA:  Acute kidney injury secondary to shock and hypoperfusion. Intubated patient. EXAM: PORTABLE CHEST 1 VIEW COMPARISON:  Single-view of the chest 10/12/2018 and 10/10/2018. FINDINGS: Support tubes and lines are unchanged and project in good position.  Diffuse hazy bilateral airspace disease has improved. No pneumothorax or pleural fluid. Heart size is normal. IMPRESSION: No change in support apparatus. Improved bilateral airspace disease. Electronically Signed   By: Inge Rise M.D.   On: 10/13/2018 08:33   Dg Chest Port 1 View  Result Date: 10/12/2018 CLINICAL DATA:  Acute respiratory failure EXAM: PORTABLE CHEST 1 VIEW COMPARISON:  Two days ago FINDINGS: Endotracheal tube tip between the clavicular heads and carina. Bilateral central line in the IJ with tips at the SVC. The orogastric tube reaches the stomach. Diffuse hazy opacification of the lungs but significantly improved aeration from prior. No visible pleural fluid or air leak. IMPRESSION: 1. Unremarkable hardware positioning. 2. Diffuse airspace disease that is improved from 2 days ago. Electronically Signed   By: Monte Fantasia M.D.   On: 10/12/2018 08:16   Dg Chest Port 1 View  Result Date: 10/10/2018 CLINICAL DATA:  ET positioning EXAM: PORTABLE CHEST 1 VIEW COMPARISON:  Radiograph October 08, 2018 FINDINGS: Endotracheal tube is positioned in the mid trachea approximately 4 cm from the carina right IJ catheter terminates at the superior cavoatrial junction. Left IJ dual lumen catheter terminates at the caval-brachiocephalic confluence. Transesophageal tube tip and side port distal to the GE junction. Pacer pad overlies the left chest. Persistent bilateral airspace opacities demonstrate some interval clearing in the upper lobes. Basilar opacities are similar to prior. Cardiomediastinal silhouette is largely obscured by overlying opacity. No visible pneumothorax or discernible effusion. No acute osseous or soft tissue abnormality. IMPRESSION: 1. Satisfactory positioning of the lines and tubes, as above. 2. Persistent bilateral airspace opacities with some interval clearing in the upper lobes. Electronically Signed   By: Lovena Le M.D.   On: 10/10/2018 05:27   Dg Chest Port 1  View  Result Date: 10/09/2018 CLINICAL DATA:  ARDS EXAM: PORTABLE CHEST 1 VIEW COMPARISON:  Radiograph 10/08/2018 FINDINGS: Pacer pads overlie the chest. Right IJ catheter tip terminates at the level of the superior cavoatrial junction. A left IJ dual lumen catheter tip terminates near the brachiocephalic-caval confluence. Endotracheal tube is positioned in the mid trachea, 3.8 cm from the carina. A transesophageal tube tip and side port are distal to the GE junction. There are diffuse consolidative opacities throughout both lungs on a background of more hazy interstitial opacity. Suspect small right effusion. No pneumothorax. Cardiac silhouette is largely obscured by the overlying opacities. IMPRESSION: Extensive bilateral airspace opacities, similar in extent to most recent comparison. Stable, satisfactory positioning of lines and tubes, as above Electronically Signed   By: Lovena Le M.D.   On: 10/09/2018 05:40   Dg Chest Port 1 View  Result Date: 10/08/2018 CLINICAL DATA:  CVL placement EXAM: PORTABLE CHEST 1 VIEW COMPARISON:  10/08/2018, 10/07/2018, 10/05/2018 FINDINGS: Endotracheal tube tip is about 4.3 cm superior to the carina. Esophageal tube tip below the diaphragm but non included. Right IJ central venous catheter tip over the SVC. Left IJ central venous catheter tip over the upper SVC.  No left pneumothorax. Extensive bilateral interstitial and alveolar disease. Obscured cardiomediastinal silhouette. Probable small effusions. IMPRESSION: 1. New left IJ central venous catheter with tip over the SVC. No pneumothorax. Support lines and tubes otherwise grossly stable in position 2. No significant change in extensive interstitial and alveolar disease bilaterally. Electronically Signed   By: Donavan Foil M.D.   On: 10/08/2018 14:06   Dg Chest Port 1 View  Result Date: 10/08/2018 CLINICAL DATA:  Check endotracheal tube placement EXAM: PORTABLE CHEST 1 VIEW COMPARISON:  10/07/2018 FINDINGS: Cardiac  shadow is stable. Right jugular central line, gastric catheter and endotracheal tube are again seen. The endotracheal tube now lies 0.6 cm above the carina slightly withdrawn when compared with the prior exam. Persistent bilateral airspace opacities are noted worst in the right lung base stable from the previous day. Small right-sided effusion is noted superiorly. Bony abnormality is noted. IMPRESSION: Tubes and lines as described above. Stable airspace opacities bilaterally. Electronically Signed   By: Inez Catalina M.D.   On: 10/08/2018 08:43   Portable Chest X-ray  Result Date: 10/07/2018 CLINICAL DATA:  Intubation EXAM: PORTABLE CHEST 1 VIEW COMPARISON:  October 06, 2018 FINDINGS: ET tube is 3 cm above the level of carina. Enteric tube is seen coursing below the diaphragm. A right-sided PICC is seen at the superior cavoatrial junction. Patchy airspace opacities are seen throughout both lungs, right greater than left. The cardiomediastinal silhouette is unchanged. IMPRESSION: ET tube 3 cm above the carina. Unchanged patchy airspace opacities, right greater than left. Electronically Signed   By: Prudencio Pair M.D.   On: 10/07/2018 10:05   Dg Chest Port 1 View  Result Date: 10/06/2018 CLINICAL DATA:  33 year old female with centralized placement. EXAM: PORTABLE CHEST 1 VIEW COMPARISON:  Chest radiograph dated 10/05/2018 FINDINGS: Right IJ central venous line with tip over the cavoatrial junction. No pneumothorax. Bilateral airspace opacities similar to prior radiograph. There is a small right pleural effusion. No acute osseous pathology. IMPRESSION: 1. Interval placement of a right IJ central venous line with tip in the region of the cavoatrial junction. No pneumothorax. 2. No significant interval change in the bilateral airspace opacities. Electronically Signed   By: Anner Crete M.D.   On: 10/06/2018 04:04   Dg Chest Port 1 View  Result Date: 10/02/2018 CLINICAL DATA:  Tachypnea EXAM: PORTABLE CHEST  1 VIEW COMPARISON:  Yesterday FINDINGS: Generalized reticular pulmonary opacity, present on multiple prior studies. No pleural fluid or pneumothorax. Normal heart size. IMPRESSION: Chronic reticular pulmonary opacity without acute superimposed finding. Electronically Signed   By: Monte Fantasia M.D.   On: 10/02/2018 05:45   Dg Chest Port 1 View  Result Date: 10/01/2018 CLINICAL DATA:  Cough, pancytopenia EXAM: PORTABLE CHEST 1 VIEW COMPARISON:  09/19/2018 FINDINGS: Stable cardiomediastinal contours. Extensive, diffuse reticulonodular opacities throughout both lungs, markedly progressed from prior. No pleural effusion. No pneumothorax. Osseous structures intact. IMPRESSION: Extensive reticulonodular opacities throughout both lungs suggestive of an atypical infection in an immunocompromised patient. Electronically Signed   By: Davina Poke M.D.   On: 10/01/2018 18:38   Vas [place] Lower Extremity Venous (dvt)  Result Date: 10/07/2018  Lower Venous Study Indications: SOB, and Edema.  Risk Factors: Advanced HIV/AIDS. Limitations: Lights on in room, patient newly vented. Comparison Study: Prior study from 06/20/17 is available for comparison Performing Technologist: Sharion Dove RVS  Examination Guidelines: A complete evaluation includes B-mode imaging, spectral Doppler, color Doppler, and power Doppler as needed of all accessible portions of each vessel.  Bilateral testing is considered an integral part of a complete examination. Limited examinations for reoccurring indications may be performed as noted.  +---------+---------------+---------+-----------+----------+--------------+  RIGHT     Compressibility Phasicity Spontaneity Properties Thrombus Aging  +---------+---------------+---------+-----------+----------+--------------+  CFV       Full            Yes       Yes                                    +---------+---------------+---------+-----------+----------+--------------+  SFJ       Full                                                              +---------+---------------+---------+-----------+----------+--------------+  FV Prox   Full                                                             +---------+---------------+---------+-----------+----------+--------------+  FV Mid    Full                                                             +---------+---------------+---------+-----------+----------+--------------+  FV Distal Full                                                             +---------+---------------+---------+-----------+----------+--------------+  PFV       Full                                                             +---------+---------------+---------+-----------+----------+--------------+  POP       Full            Yes       Yes                                    +---------+---------------+---------+-----------+----------+--------------+  PTV       Full                                                             +---------+---------------+---------+-----------+----------+--------------+  PERO      Full                                                             +---------+---------------+---------+-----------+----------+--------------+   +---------+---------------+---------+-----------+----------+--------------+  LEFT      Compressibility Phasicity Spontaneity Properties Thrombus Aging  +---------+---------------+---------+-----------+----------+--------------+  CFV       Full            Yes       Yes                                    +---------+---------------+---------+-----------+----------+--------------+  SFJ       Full                                                             +---------+---------------+---------+-----------+----------+--------------+  FV Prox   Full                                                             +---------+---------------+---------+-----------+----------+--------------+  FV Mid    Full                                                              +---------+---------------+---------+-----------+----------+--------------+  FV Distal Full                                                             +---------+---------------+---------+-----------+----------+--------------+  PFV       Full                                                             +---------+---------------+---------+-----------+----------+--------------+  POP       Full            Yes       Yes                                    +---------+---------------+---------+-----------+----------+--------------+  PTV       Full                                                             +---------+---------------+---------+-----------+----------+--------------+  PERO  Not visualized  +---------+---------------+---------+-----------+----------+--------------+     Summary: Right: Findings appear essentially unchanged compared to previous examination. There is no evidence of deep vein thrombosis in the lower extremity. Left: Findings appear essentially unchanged compared to previous examination. There is no evidence of deep vein thrombosis in the lower extremity. However, portions of this examination were limited- see technologist comments above.  *See table(s) above for measurements and observations. Electronically signed by Monica Martinez MD on 10/07/2018 at 6:03:03 PM.    Final    Vas [place] Upper Extremity Venous Duplex  Result Date: 10/15/2018 UPPER VENOUS STUDY  Indications: Edema Limitations: Bandages and Edema and size of the veins. Comparison Study: No previous study available Performing Technologist: Toma Copier RVS  Examination Guidelines: A complete evaluation includes B-mode imaging, spectral Doppler, color Doppler, and power Doppler as needed of all accessible portions of each vessel. Bilateral testing is considered an integral part of a complete examination. Limited examinations for reoccurring indications may be performed as  noted.  Right Findings: +----------+------------+---------+-----------+----------+---------------------+  RIGHT      Compressible Phasicity Spontaneous Properties        Summary         +----------+------------+---------+-----------+----------+---------------------+  IJV                                                      Not visualized due to                                                               dialysis access     +----------+------------+---------+-----------+----------+---------------------+  Subclavian     Full        Yes        Yes                                       +----------+------------+---------+-----------+----------+---------------------+  Axillary       Full        Yes        Yes                                       +----------+------------+---------+-----------+----------+---------------------+  Brachial       Full        Yes        Yes                                       +----------+------------+---------+-----------+----------+---------------------+  Radial         Full                                                             +----------+------------+---------+-----------+----------+---------------------+  Ulnar  Full                                                             +----------+------------+---------+-----------+----------+---------------------+  Cephalic                                                 Not visualized due to                                                            edema and size of the                                                                    vein           +----------+------------+---------+-----------+----------+---------------------+  Basilic                                                  Not visualized due to                                                            the size of the vein   +----------+------------+---------+-----------+----------+---------------------+ Technically limited due to dialysis access in the neck, size  and edema.  Left Findings: +----------+------------+---------+-----------+----------+---------------------+  LEFT       Compressible Phasicity Spontaneous Properties        Summary         +----------+------------+---------+-----------+----------+---------------------+  IJV                                                      Not visualized due to                                                                IV placement       +----------+------------+---------+-----------+----------+---------------------+  Subclavian  Not visualized due to                                                               bandages for IV                                                                     placement        +----------+------------+---------+-----------+----------+---------------------+  Axillary       Full        Yes        Yes                                       +----------+------------+---------+-----------+----------+---------------------+  Brachial       Full        Yes        Yes                                       +----------+------------+---------+-----------+----------+---------------------+  Radial         Full                                                             +----------+------------+---------+-----------+----------+---------------------+  Ulnar          Full                                                             +----------+------------+---------+-----------+----------+---------------------+  Cephalic                                                 Not visualize due to                                                                size and edema      +----------+------------+---------+-----------+----------+---------------------+  Basilic                                                  Not visualized due to  size and edema       +----------+------------+---------+-----------+----------+---------------------+ Technically limited due to bandages, IV placement, size. and edema  Summary:  Right: No evidence of deep vein thrombosis in the upper extremity. However, unable to visualize all veins. See comments listed above. No evidence of superficial vein thrombosis . However, unable to visualize See comments listed above. No evidence of thrombosis in the subclavian. This was a limited study.  Left: No evidence of deep vein thrombosis in the upper extremity. However, unable to visualize all veins. See comments listed avone. No evidence of superficial vein thrombosis . However, unable to visualize all veins. See comments listed avone. Unable to visualize the subclavian/ See comments listed abone. This was a limited study.  *See table(s) above for measurements and observations.  Diagnosing physician: Servando Snare MD Electronically signed by Servando Snare MD on 10/15/2018 at 1:15:33 PM.    Final     Time Spent in minutes  30   [name] M.D on 10/18/2018 at 6:02 AM  Between 7am to 7pm - Pager - [phone]    After 7pm go to [email or web address] - password [facility]  [facility] -  Office  [phone]

## 2018-10-18 NOTE — Evaluation (Signed)
Physical Therapy Evaluation Patient Details Name: Krystal Short MRN: [id number] DOB: 1985/02/26 Today's Date: 10/18/2018   History of Present Illness  33 year old female history of HIV/AIDS, chronic anemia/thrombocytopenia requiring multiple transfusions in the past, who was admitted on 10/05/2018 with progressive shortness of breath and acute recurrent hypoxemic respiratory failure. Pt on ventilator 10/07/18-10/14/18. Dx of metabolic acidosis, AKI, acute metabolic encephalopathy, thrombyocytopenia  Clinical Impression  Pt admitted with above diagnosis. Min assist for bed mobility, min assist to pivot from bed to recliner. Instructed pt in BLE strengthening exercises. HR 125 max with activity, SaO2 99% on 2L O2 with activity. Good progress expected.  Pt currently with functional limitations due to the deficits listed below (see PT Problem List). Pt will benefit from skilled PT to increase their independence and safety with mobility to allow discharge to the venue listed below.       Follow Up Recommendations Supervision for mobility/OOB;Home health PT    Equipment Recommendations  Rolling walker with 5" wheels    Recommendations for Other Services       Precautions / Restrictions Precautions Precautions: Fall Restrictions Weight Bearing Restrictions: No      Mobility  Bed Mobility   Bed Mobility: Supine to Sit;Sit to Supine     Supine to sit: Min assist Sit to supine: Min assist   General bed mobility comments: assist to raise trunk; assist for LEs into bed  Transfers Overall transfer level: Needs assistance Equipment used: 1 person hand held assist Transfers: Sit to/from Stand;Stand Pivot Transfers Sit to Stand: Min assist Stand pivot transfers: Min assist       General transfer comment: min A to rise and steady, HR 125 with SPT, SaO2 99% on 2L O2  Ambulation/Gait             General Gait Details: deferred 2* fatigue with activity  Stairs             Wheelchair Mobility    Modified Rankin (Stroke Patients Only)       Balance Overall balance assessment: Needs assistance   Sitting balance-Leahy Scale: Fair     Standing balance support: Single extremity supported Standing balance-Leahy Scale: Poor Standing balance comment: single UE support for static/dynamic standing                             Pertinent Vitals/Pain Pain Assessment: No/denies pain    Home Living Family/patient expects to be discharged to:: Private residence Living Arrangements: Spouse/significant other;Parent Available Help at Discharge: Family;Available 24 hours/day Type of Home: Mobile home Home Access: Stairs to enter Entrance Stairs-Rails: Left Entrance Stairs-Number of Steps: 5 Home Layout: One level Home Equipment: None      Prior Function Level of Independence: Independent         Comments: at times spouse has to help with IADL's     Hand Dominance        Extremity/Trunk Assessment   Upper Extremity Assessment Upper Extremity Assessment: Generalized weakness    Lower Extremity Assessment Lower Extremity Assessment: Generalized weakness(B knee ext -4/5)    Cervical / Trunk Assessment Cervical / Trunk Assessment: Normal  Communication   Communication: No difficulties  Cognition Arousal/Alertness: Lethargic Behavior During Therapy: WFL for tasks assessed/performed Overall Cognitive Status: Impaired/Different from baseline  General Comments: increased time to respond to commands and questions, mildly lethargic      General Comments      Exercises General Exercises - Lower Extremity Ankle Circles/Pumps: AROM;Both;10 reps;Supine Long Arc Quad: AROM;Both;10 reps;Seated   Assessment/Plan    PT Assessment Patient needs continued PT services  PT Problem List Decreased strength;Decreased activity tolerance;Decreased balance;Decreased mobility       PT  Treatment Interventions Gait training;Stair training;Functional mobility training;Therapeutic exercise;Therapeutic activities    PT Goals (Current goals can be found in the Care Plan section)  Acute Rehab PT Goals Patient Stated Goal: to get stronger, walk, play with the kids PT Goal Formulation: With patient/family Time For Goal Achievement: 10/25/18 Potential to Achieve Goals: Fair    Frequency Min 3X/week   Barriers to discharge        Co-evaluation               AM-PAC PT "6 Clicks" Mobility  Outcome Measure Help needed turning from your back to your side while in a flat bed without using bedrails?: A Little Help needed moving from lying on your back to sitting on the side of a flat bed without using bedrails?: A Little Help needed moving to and from a bed to a chair (including a wheelchair)?: A Little Help needed standing up from a chair using your arms (e.g., wheelchair or bedside chair)?: A Little Help needed to walk in hospital room?: A Lot Help needed climbing 3-5 steps with a railing? : Total 6 Click Score: 15    End of Session Equipment Utilized During Treatment: Gait belt Activity Tolerance: Patient tolerated treatment well Patient left: with call bell/phone within reach;in chair;with family/visitor present Nurse Communication: Mobility status PT Visit Diagnosis: Unsteadiness on feet (R26.81);Difficulty in walking, not elsewhere classified (R26.2);Muscle weakness (generalized) (M62.81)    Time: [id number] PT Time Calculation (min) (ACUTE ONLY): 14 min   Charges:   PT Evaluation $PT Eval Low Complexity: 1 Low         Blondell Reveal Kistler PT 10/18/2018  Acute Rehabilitation Services Pager [phone] Office [phone]

## 2018-10-18 NOTE — Progress Notes (Signed)
eLink Physician-Brief Progress Note Patient Name: Krystal Short DOB: [birth year] MRN: [id number]   Date of Service  10/18/2018  HPI/Events of Note  Pt requesting for spray for throat.  Pt passed her swallow study and is on a regular diet.  eICU Interventions  Chloraseptic spray ordered.     Intervention Category Minor Interventions: Other:  Elsie Lincoln 10/18/2018, 8:38 PM

## 2018-10-18 NOTE — Progress Notes (Signed)
  Speech Language Pathology Treatment: Dysphagia  Patient Details Name: Krystal Short MRN: [id number] DOB: [birth year] Today's Date: 10/18/2018 Time: 0900-0920 SLP Time Calculation (min) (ACUTE ONLY): 20 min  Assessment / Plan / Recommendation Clinical Impression  Pt improved this morning per RN. After oral care, pt was given trials of ice chips, thin liquid, and puree. No overt s/s aspiration on any consistency, including 3oz water challenge. RN present to provide po meds, which pt tolerated in puree. Cortrak was found in pt bed last night. Will proceed with MBS today (at noon per radiology), and hopefully avoid having to replace feeding tube. RN informed of plan.   HPI HPI: 33 yo F with history of HIV/AIDS admitted on 8/29 with progressive shortness of breath. History of multiple admissions over the last year for waxing / waning pulmonary infiltrates. Patient decompensated on 8/31 requiring initiation of mechanical intubation and later pressure support and CRRT, was extubated 10/14/2018.  Swallow eval ordered.  Pt denies h/o dysphagia.  .Pt was taken off CRRT yesterday.  Today is tachycardic.  She reports she is receiving ice chips and her spouse gave her "lots of them" yesterday.  Pt had a small bore feeding tube for nutrition, however, this was found out thursday 10/17/2018 night. Pt to have MBS today.      SLP Plan  Continue with current plan of care;MBS       Recommendations  Diet recommendations: Other(comment)(pending MBS)                Oral Care Recommendations: Oral care QID Follow up Recommendations: Other (comment)(TBD) SLP Visit Diagnosis: Dysphagia, pharyngeal phase (R13.13) Plan: Continue with current plan of care;MBS       GO              [name] B. Quentin Ore [facility], CCC-SLP Speech Language Pathologist [phone]  [name] 10/18/2018, 9:21 AM

## 2018-10-18 NOTE — Plan of Care (Addendum)
  Problem: Clinical Measurements: Goal: Ability to maintain clinical measurements within normal limits will improve Outcome: Progressing Goal: Diagnostic test results will improve Outcome: Progressing Goal: Respiratory complications will improve Outcome: Progressing Goal: Cardiovascular complication will be avoided Outcome: Progressing   Problem: Nutrition: Goal: Adequate nutrition will be maintained Outcome: Progressing   

## 2018-10-18 NOTE — Progress Notes (Signed)
Brief Pharmacy note:   Passed Barium swallow, diet ordered, meds to po  Will transition back to po Biktarvy, discontinue Descovy/Tivicay used for tube administration.  Minda Ditto PharmD Pager [phone] 10/18/2018, 3:39 PM

## 2018-10-18 NOTE — Progress Notes (Signed)
Peripherally Inserted Central Catheter/Midline Placement  The IV Nurse has discussed with the patient and/or persons authorized to consent for the patient, the purpose of this procedure and the potential benefits and risks involved with this procedure.  The benefits include less needle sticks, lab draws from the catheter, and the patient may be discharged home with the catheter. Risks include, but not limited to, infection, bleeding, blood clot (thrombus formation), and puncture of an artery; nerve damage and irregular heartbeat and possibility to perform a PICC exchange if needed/ordered by physician.  Alternatives to this procedure were also discussed.  Bard Power PICC patient education guide, fact sheet on infection prevention and patient information card has been provided to patient /or left at bedside.    PICC/Midline Placement Documentation  PICC Double Lumen 10/18/18 PICC Left Brachial 39 cm 0 cm (Active)  Indication for Insertion or Continuance of Line Prolonged intravenous therapies 10/18/18 1603  Exposed Catheter (cm) 0 cm 10/18/18 1603  Site Assessment Clean;Dry;Intact 10/18/18 1603  Lumen #1 Status Flushed;Saline locked;Blood return noted 10/18/18 1603  Lumen #2 Status Flushed;Saline locked;Blood return noted 10/18/18 1603  Dressing Type Transparent;Securing device 10/18/18 1603  Dressing Status Clean;Dry;Intact;Antimicrobial disc in place 10/18/18 1603  Dressing Intervention New dressing 10/18/18 1603  Dressing Change Due 10/25/18 10/18/18 1603    OK to insert PICC line on the left arm as per [name] MD    Valentina Shaggy Ramos 10/18/2018, 4:22 PM

## 2018-10-18 NOTE — Progress Notes (Addendum)
Found cortrac tube in patient's bed. Tube feeding @ 45cc/hr stopped. Pt will be unable to get her 6AM dose of guaifenesin. Pt received 2 units insulin @0415  for CBG-142. Will recheck CBG at 0600. Schorr, NP notified.

## 2018-10-19 DIAGNOSIS — R140 Abdominal distension (gaseous): Secondary | ICD-10-CM

## 2018-10-19 DIAGNOSIS — J9600 Acute respiratory failure, unspecified whether with hypoxia or hypercapnia: Secondary | ICD-10-CM

## 2018-10-19 LAB — RENAL FUNCTION PANEL
Albumin: 2.3 g/dL — ABNORMAL LOW (ref 3.5–5.0)
Anion gap: 12 (ref 5–15)
BUN: 67 mg/dL — ABNORMAL HIGH (ref 6–20)
CO2: 20 mmol/L — ABNORMAL LOW (ref 22–32)
Calcium: 8.1 mg/dL — ABNORMAL LOW (ref 8.9–10.3)
Chloride: 110 mmol/L (ref 98–111)
Creatinine, Ser: 1.27 mg/dL — ABNORMAL HIGH (ref 0.44–1.00)
GFR calc Af Amer: >60 mL/min (ref >60)
GFR calc non Af Amer: 56 mL/min — ABNORMAL LOW (ref >60)
Glucose, Bld: 123 mg/dL — ABNORMAL HIGH (ref 70–99)
Phosphorus: 3.2 mg/dL (ref 2.5–4.6)
Potassium: 3.4 mmol/L — ABNORMAL LOW (ref 3.5–5.1)
Sodium: 142 mmol/L (ref 135–145)

## 2018-10-19 LAB — CBC
HCT: 25.4 % — ABNORMAL LOW (ref 36.0–46.0)
Hemoglobin: 8.2 g/dL — ABNORMAL LOW (ref 12.0–15.0)
MCH: 30.7 pg (ref 26.0–34.0)
MCHC: 32.3 g/dL (ref 30.0–36.0)
MCV: 95.1 fL (ref 80.0–100.0)
Platelets: 45 10*3/uL — ABNORMAL LOW (ref 150–400)
RBC: 2.67 MIL/uL — ABNORMAL LOW (ref 3.87–5.11)
RDW: 18.9 % — ABNORMAL HIGH (ref 11.5–15.5)
WBC: 12.3 10*3/uL — ABNORMAL HIGH (ref 4.0–10.5)
nRBC: 6.4 % — ABNORMAL HIGH (ref 0.0–0.2)

## 2018-10-19 LAB — GLUCOSE, CAPILLARY
Glucose-Capillary: 177 mg/dL — ABNORMAL HIGH (ref 70–99)
Glucose-Capillary: 63 mg/dL — ABNORMAL LOW (ref 70–99)
Glucose-Capillary: 117 mg/dL — ABNORMAL HIGH (ref 70–99)
Glucose-Capillary: 77 mg/dL (ref 70–99)
Glucose-Capillary: 161 mg/dL — ABNORMAL HIGH (ref 70–99)
Glucose-Capillary: 139 mg/dL — ABNORMAL HIGH (ref 70–99)
Glucose-Capillary: 239 mg/dL — ABNORMAL HIGH (ref 70–99)

## 2018-10-19 LAB — MAGNESIUM: Magnesium: 1.2 mg/dL — ABNORMAL LOW (ref 1.7–2.4)

## 2018-10-19 MED ORDER — MAGNESIUM SULFATE 2 GM/50ML IV SOLN
2.0000 g | Freq: Once | INTRAVENOUS | Status: AC
  Administered 2018-10-19: 07:00:00 2 g via INTRAVENOUS
  Filled 2018-10-19: qty 50

## 2018-10-19 MED ORDER — POTASSIUM CHLORIDE 10 MEQ/100ML IV SOLN
10.0000 meq | INTRAVENOUS | Status: AC
  Administered 2018-10-19 (×4): 10 meq via INTRAVENOUS
  Filled 2018-10-19 (×4): qty 100

## 2018-10-19 NOTE — Progress Notes (Signed)
Pt noted to have lower blood sugars in the early mornings as a trend.  Also noted that patient has had frequent PVCs, bigeminy, trigeminy over past couple of days.

## 2018-10-19 NOTE — Progress Notes (Signed)
Pt cbg is 63.  Pt alert and oriented able to take pos.  Pt drank whole cup of apple juice.  CBG at 0430 was 117

## 2018-10-19 NOTE — Progress Notes (Signed)
[facility] NP on call paged with potassium and magnesium levels.

## 2018-10-19 NOTE — Progress Notes (Signed)
Patient ID: Krystal Short, female   DOB: March 07, 1985, 33 y.o.   MRN: [id number] [facility] Progress Note   Assessment/ Plan:   1. Acute kidney Injury: Suspected to be secondary to ATN from septic shock.  Urine output not completely charted overnight however, creatinine continues to trend down and reportedly she has been making good amounts of urine.  No acute electrolyte abnormalities to prompt intervention/indicate dialysis-we will discontinue dialysis catheter. 2.  Acute/recurrent hypoxemic respiratory failure: With bronchoalveolar lavage showing Staphylococcus epidermidis for which he is on antimicrobial coverage.  Testing for PJP negative. 3.  Thrombocytopenia/anemia: Ongoing work-up including bone marrow biopsy recently undertaken.  Additional management per hematology.  Status post PRBC transfusion. 4.  Hypokalemia: Secondary to recovering renal function/tubular handling defect-replace via oral route.  Ongoing renal recovery, will sign off at this time-recommend follow-up with her primary care provider to assess labs following discharge.  Subjective:   Without acute events overnight, reports to be feeling better, denies any chest pain or shortness of breath.   Objective:   BP 125/81 (BP Location: Right Arm)   Pulse (!) 102   Temp 98.3 F (36.8 C) (Oral)   Resp (!) 23   Ht _0  (1.549 m)   Wt 48.1 kg   LMP  (LMP Unknown) Comment: pt is on depo  SpO2 (!) 86%   BMI 20.04 kg/m   Intake/Output Summary (Last 24 hours) at 10/19/2018 1158 Last data filed at 10/19/2018 0000 Gross per 24 hour  Intake 450 ml  Output -  Net 450 ml   Weight change: 1.4 kg  Physical Exam: Gen: Resting comfortably in bed-on oxygen via nasal cannula, mother at bedside CVS: Pulse regular rhythm, normal rate, S1 and S2 with ejection systolic murmur Resp: Diminished breath sounds over both bases without distinct rales or rhonchi Abd: Soft, flat, nontender Ext: No palpable edema over lower  extremities, some trace dependent edema noted  Imaging: Dg Swallowing Func-speech Pathology  Result Date: 10/18/2018 Objective Swallowing Evaluation: Type of Study: MBS-Modified Barium Swallow Study  Patient Details Name: Krystal Short MRN: [id number] Date of Birth: 01-07-1986 Today's Date: 10/18/2018 Time: SLP Start Time (ACUTE ONLY): 1000 -SLP Stop Time (ACUTE ONLY): 1015 SLP Time Calculation (min) (ACUTE ONLY): 15 min Past Medical History: Past Medical History: Diagnosis Date . Acute hyponatremia 06/03/2017 . Anemia  . CAP (community acquired pneumonia) 06/03/2017 . Facial dermatitis 06/03/2017 . HIV (human immunodeficiency virus infection) ([place])   diagnosed in April 2019 . Pleurisy 06/03/2017 . 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 ([place])  . Thrush of mouth and esophagus ([place])  . UTI (urinary tract infection)  Past Surgical History: Past Surgical History: Procedure Laterality Date . BIOPSY  07/12/2018  Procedure: BIOPSY;  Surgeon: Laurin Coder, MD;  Location: [facility];  Service: Endoscopy;; . BRONCHIAL WASHINGS  07/12/2018  Procedure: BRONCHIAL WASHINGS;  Surgeon: Laurin Coder, MD;  Location: [facility];  Service: Endoscopy;; . ENDOBRONCHIAL ULTRASOUND N/A 07/12/2018  Procedure: ENDOBRONCHIAL ULTRASOUND;  Surgeon: Laurin Coder, MD;  Location: [facility];  Service: Endoscopy;  Laterality: N/A; . FINE NEEDLE ASPIRATION BIOPSY  07/12/2018  Procedure: FINE NEEDLE ASPIRATION BIOPSY;  Surgeon: Laurin Coder, MD;  Location: [facility];  Service: Endoscopy;; . NO PAST SURGERIES   . VIDEO BRONCHOSCOPY N/A 07/12/2018  Procedure: VIDEO BRONCHOSCOPY WITHOUT FLUORO;  Surgeon: Laurin Coder, MD;  Location: [facility];  Service: Endoscopy;  Laterality: N/A; HPI: 33 yo F with history of HIV/AIDS admitted on 8/29 with progressive  shortness of breath. History of multiple admissions over the last year for waxing / waning pulmonary infiltrates. Patient decompensated on 8/31 requiring  initiation of mechanical intubation and later pressure support and CRRT, was extubated 10/14/2018.  Swallow eval ordered.  Pt denies h/o dysphagia.  .Pt was taken off CRRT yesterday.  Today is tachycardic.  She reports she is receiving ice chips and her spouse gave her "lots of them" yesterday.  Pt had a small bore feeding tube for nutrition, however, this was found out thursday 10/17/2018 night. Pt to have MBS today.  Subjective: Pt seen in radiology for MBS. RN in attendance. Assessment / Plan / Recommendation CHL IP CLINICAL IMPRESSIONS 10/18/2018 Clinical Impression Pt presents with essentially normal oropharyngeal swallow function. One episode of very trace flash penetration was noted with large straw sips, however, penetrate cleared spontaneously, and no other episodes of penetration were noted. Otherwise, normal swallow without oral residue, post-swallow pharyngeal residue, or aspiration noted. Recommend beginning regular texture solids and thin liquids, meds whole with liquid. Safe swallow precautions posted at [facility] and reviewed with pt and her mother. SLP will follow for diet tolerance and education.  SLP Visit Diagnosis Dysphagia, oropharyngeal phase (R13.12)     Impact on safety and function Mild aspiration risk   CHL IP TREATMENT RECOMMENDATION 10/18/2018 Treatment Recommendations Therapy as outlined in treatment plan below   Prognosis 10/15/2018 Prognosis for Safe Diet Advancement Good     CHL IP DIET RECOMMENDATION 10/18/2018 SLP Diet Recommendations Regular solids Liquid Administration via Straw;Cup Medication Administration Whole meds with liquid Compensations Slow rate;Small sips/bites;Minimize environmental distractions Postural Changes Seated upright at 90 degrees   CHL IP OTHER RECOMMENDATIONS 10/18/2018 Recommended Consults -- Oral Care Recommendations Oral care QID Other Recommendations --   CHL IP FOLLOW UP RECOMMENDATIONS 10/18/2018 Follow up Recommendations None   CHL IP FREQUENCY AND DURATION  10/18/2018 Speech Therapy Frequency (ACUTE ONLY) min 1 x/week Treatment Duration 1 week;2 weeks      CHL IP ORAL PHASE 10/18/2018 Oral Phase WFL  CHL IP PHARYNGEAL PHASE 10/18/2018 Pharyngeal Phase Impaired Pharyngeal- Nectar Cup WFL Pharyngeal- Thin Cup Reduced airway/laryngeal closure;Penetration/Aspiration during swallow Pharyngeal Material enters airway, remains ABOVE vocal cords then ejected out;Material does not enter airway Pharyngeal- Puree WFL Pharyngeal- Regular WFL  CHL IP CERVICAL ESOPHAGEAL PHASE 10/18/2018 Cervical Esophageal Phase [facility] Celia B. Quentin O[facility], CCC-SLP Speech Language Pathologist [phone] [name] 10/18/2018, 10:40 AM              [place] Ekg Site Rite  Result Date: 10/18/2018 If [facility] image not attached, placement could not be confirmed due to current cardiac rhythm.   Labs: BMET Recent Labs  Lab 10/14/18 1708 10/15/18 0330 10/15/18 1732 10/16/18 0350 10/17/18 0423 10/18/18 0425 10/19/18 0430  NA 136 137  137 136 134* 138 142 142  K 4.2 3.9  4.0 3.8 4.2 4.4 3.7 3.4*  CL 100 102  103 101 101 104 106 110  CO2 _0 20*  GLUCOSE 282* 102*  100* 177* 173* 123* 150* 123*  BUN 35* 27*  28* 27* 31* 58* 78* 67*  CREATININE 1.00 1.06*  1.13* 1.04* 1.09* 1.57* 1.52* 1.27*  CALCIUM 7.4* 7.7*  7.6* 7.9* 7.7* 7.3* 8.3* 8.1*  PHOS 9.6* 3.6  3.6 2.3* 2.4* 2.6 3.4 3.2   CBC Recent Labs  Lab 10/16/18 0725 10/17/18 0423 10/17/18 1500 10/18/18 0425 10/19/18 0430  WBC 16.8* 16.5*  --  13.5* 12.3*  NEUTROABS  13.4* 13.5*  --   --   --   HGB 7.8* 6.1* 8.1* 8.1* 8.2*  HCT 24.6* 19.3* 25.1* 25.2* 25.4*  MCV 96.5 97.5  --  93.0 95.1  PLT 21* 26*  --  30* 45*    Medications:    . atovaquone  1,500 mg Oral Daily  . azithromycin  1,200 mg Oral Q Tue  . benzonatate  100 mg Oral TID  . bictegravir-emtricitabine-tenofovir AF  1 tablet Oral Daily  . chlorhexidine  15 mL Mouth Rinse BID  . Chlorhexidine Gluconate Cloth  6 each Topical Daily   . dexamethasone  16 mg Intravenous Q24H  . feeding supplement (PRO-STAT SUGAR FREE 64)  30 mL Per Tube QID  . guaiFENesin-dextromethorphan  7.5 mL Per Tube Q6H  . insulin aspart  2-6 Units Subcutaneous Q4H  . lidocaine  1 patch Transdermal Q24H  . mouth rinse  15 mL Mouth Rinse q12n4p  . mometasone-formoterol  2 puff Inhalation BID  . pantoprazole sodium  40 mg Per Tube Daily  . romiPLOStim  5 mcg/kg Subcutaneous Weekly  . sodium chloride flush  10-40 mL Intracatheter Q12H   Elmarie Shiley, MD 10/19/2018, 11:58 AM

## 2018-10-19 NOTE — Progress Notes (Signed)
[facility]                                                                              Patient Demographics  Krystal Short, is a 33 y.o. female, DOB - [birth year], EPP:[id number]  Admit date - 10/05/2018   Admitting Physician Neena Rhymes, MD  Outpatient Primary MD for the patient is Lajean Manes, MD  Outpatient specialists:   LOS - 13  days   Medical records reviewed and are as summarized below:    Chief Complaint  Patient presents with   Shortness of Breath       Brief summary   Patient is an unfortunate 33 year old female history of HIV/AIDS, chronic anemia/thrombocytopenia requiring multiple transfusions in the past, who was admitted on 10/05/2018 with progressive shortness of breath and acute recurrent hypoxemic respiratory failure. Patient noted have been treated for PGP pneumonia, bacterial pneumonia and with steroids in the past. Patient noted to have some bone marrow suppression as well as platelets of just 9 which trended down from 16 early on in the month. Patient underwent a bone marrow biopsy, BAL. Patient decompensated during the hospitalization and on 10/07/2018 required initiation of mechanical ventilation with pressure support requiring pressors and CRRT. Patient noted to be having metabolic acidosis, acute kidney injury and subsequently anuric. Nephrology consulted, ID consulted and are following. Patient on PCCM service. Patient extubated 10/14/2018 and patient transferred to hospitalist service. PCCM following along.  [facility] assumed care on 9/9  Significant hospital events 8/29 patient was admitted with progressive shortness of breath and respiratory failure 8/30 PCCM was consulted,started solumedrol  80 mg q 12h 8/31 Intubated, FOB 9/1 started on CRRT 9/4 High dose decadron 20 mg IV qd started by heme due to concern of secondary [place]. Send soluble CD 25/ IL2Ra levels - returned on 9/9 as high > 6000 9/5 Weaning pressors, starting  PSV weans, improving CXR 9/7 - Awake on the vent.  No acute events. Weaning on pressure support, extubated on 9/7 9/8 -  Needed bipap respiratory distress. Now on [place] and doing well. Denies vaping . Admits to using DOWN PILLOW (feather) At home x 7 months - new. However, denies  any organic antigen (mold, birds, mildew, humidifer)  9/9  - down to 2L [place]. Per RN =SPB runing soft and MAP barely 65.  Anuric on negative balance with CRRT. Also, each night cRRT filter clots she gets more hypoxemic.  Assessment & Plan   Acute respiratory failure, recurrent with hypoxemia, recurrent bilateral pulmonary infiltrates -Unclear etiology, felt secondary to acute lung injury, possibly hypersensitivity pneumonitis.  -Extubated on 9/7, BAL done, positive for staph epidermidis, PGP negative -Blood cultures negative so far, patient received Bactrim, IV cefepime 8/29> 9/5 -Per ID recommendations, now on oral Zithromax -O2 sat stable 99% on 2 L, intermittent BiPAP if needed.  Pulmonology, ID following   Acute kidney injury, metabolic acidosis, hyperkalemia, hypocalcemia -Felt secondary to ATN versus cortical necrosis from septic shock, was anuric -Nephrology was consulted, was placed on CRRT on 9/1, now off -Nephrology following, recommended continue strict I's and O's, daily labs, packed RBC transfusion to improve renal perfusion as well  Hypophosphatemia Secondary to CRRT losses, GI losses, replaced via IV  Persistent profound thrombocytopenia, anemia -Patient noted to have had extensive work-up by heme-onc with bone marrow biopsy with normal bone marrow.   -HIT antibody negative, MAHA negative -Patient was initially started on steroids for possible sarcoidosis but no granulomas noted on prior biopsies, tapered off -Status post 6 units platelets, 3 units packed RBC transfusion. -Hematology, Dr. Irene Limbo following, patient was placed on Decadron 20 mg daily, ended on 9/7 -Hemoglobin stable 8.2, platelets 45 K,  improving  HIV/AIDS On Biktarvy, continue atovaquone for PCP prophylaxis, Zithromax for MAI prophylaxis -Per ID  Acute metabolic encephalopathy In the setting of advanced HIV/AIDS, respiratory failure, acute kidney injury, profound anemia, thrombocytopenia -CT head negative, improving  Moderate protein calorie malnutrition - on regular diet -SLP evaluation done on 9/11, feeding tube was out on 9/10   Transaminitis -Improving  Severe generalized debility: -PT OT evaluation when medically stable   Code Status: Full code DVT Prophylaxis: Prevalon boots, SCDs  family Communication: Discussed all imaging results, lab results, explained to the patient   Disposition Plan: Remains inpatient, high risk for deterioration, stays in stepdown  Time Spent in minutes   40 minutes  Procedures:  Intubation, extubation CRRT  Consultants:   PCCM Infectious disease Heme oncology Nephrology  Antimicrobials:   Anti-infectives (From admission, onward)   Start     Dose/Rate Route Frequency Ordered Stop   10/19/18 1000  bictegravir-emtricitabine-tenofovir AF (BIKTARVY) 50-200-25 MG per tablet 1 tablet     1 tablet Oral Daily 10/18/18 1538     10/09/18 1000  emtricitabine-tenofovir AF (DESCOVY) 200-25 MG per tablet 1 tablet  Status:  Discontinued     1 tablet Per Tube Daily 10/08/18 1448 10/18/18 1538   10/09/18 1000  dolutegravir (TIVICAY) tablet 50 mg  Status:  Discontinued     50 mg Oral Daily 10/08/18 1448 10/18/18 1538   10/09/18 0600  ceFEPIme (MAXIPIME) 2 g in sodium chloride 0.9 % 100 mL IVPB  Status:  Discontinued     2 g 200 mL/hr over 30 Minutes Intravenous Every 24 hours 10/08/18 0740 10/08/18 1450   10/08/18 1800  ceFEPIme (MAXIPIME) 2 g in sodium chloride 0.9 % 100 mL IVPB     2 g 200 mL/hr over 30 Minutes Intravenous Every 12 hours 10/08/18 1450 10/12/18 1746   10/08/18 1000  azithromycin (ZITHROMAX) tablet 1,200 mg     1,200 mg Oral Every Tue 10/06/18 0520     10/06/18  1800  ceFEPIme (MAXIPIME) 2 g in sodium chloride 0.9 % 100 mL IVPB  Status:  Discontinued     2 g 200 mL/hr over 30 Minutes Intravenous Every 12 hours 10/06/18 0632 10/08/18 0740   10/06/18 1000  atovaquone (MEPRON) 750 MG/5ML suspension 1,500 mg     1,500 mg Oral Daily 10/06/18 0520     10/06/18 1000  bictegravir-emtricitabine-tenofovir AF (BIKTARVY) 50-200-25 MG per tablet 1 tablet  Status:  Discontinued     1 tablet Oral Daily 10/06/18 0520 10/08/18 1448   10/06/18 0045  ceFEPIme (MAXIPIME) 2 g in sodium chloride 0.9 % 100 mL IVPB  Status:  Discontinued     2 g 200 mL/hr over 30 Minutes Intravenous Every 8 hours 10/06/18 0032 10/06/18 0632   10/06/18 0030  sulfamethoxazole-trimethoprim (BACTRIM) 260 mg of trimethoprim in dextrose 5 % 250 mL IVPB  Status:  Discontinued     260 mg of trimethoprim 266.3 mL/hr over 60 Minutes Intravenous Every 8 hours 10/06/18  3875 10/06/18 0801          Medications  Scheduled Meds:  atovaquone  1,500 mg Oral Daily   azithromycin  1,200 mg Oral Q Tue   benzonatate  100 mg Oral TID   bictegravir-emtricitabine-tenofovir AF  1 tablet Oral Daily   chlorhexidine  15 mL Mouth Rinse BID   Chlorhexidine Gluconate Cloth  6 each Topical Daily   dexamethasone  16 mg Intravenous Q24H   feeding supplement (PRO-STAT SUGAR FREE 64)  30 mL Per Tube QID   guaiFENesin-dextromethorphan  7.5 mL Per Tube Q6H   insulin aspart  2-6 Units Subcutaneous Q4H   lidocaine  1 patch Transdermal Q24H   mouth rinse  15 mL Mouth Rinse q12n4p   mometasone-formoterol  2 puff Inhalation BID   pantoprazole sodium  40 mg Per Tube Daily   romiPLOStim  5 mcg/kg Subcutaneous Weekly   sodium chloride flush  10-40 mL Intracatheter Q12H   Continuous Infusions:  sodium chloride 10 mL/hr at 10/11/18 1737   sodium chloride Stopped (10/18/18 0000)   anticoagulant sodium citrate     feeding supplement (VITAL 1.5 CAL) Stopped (10/18/18 0430)   PRN Meds:.sodium  chloride, Place/Maintain arterial line **AND** sodium chloride, acetaminophen (TYLENOL) oral liquid 160 mg/5 mL, albuterol, alteplase, anticoagulant sodium citrate, fentaNYL (SUBLIMAZE) injection, midazolam, phenol, sodium chloride flush      Subjective:   Arayah Outten was seen and examined today.  Very deconditioned, weak congested cough.  Alert and oriented.  Patient denies dizziness, abdominal pain, N/V/D/C, tingling. No acute events overnight.  No fevers or chills.  Objective:   Vitals:   10/19/18 0400 10/19/18 0500 10/19/18 0720 10/19/18 0800  BP: 111/71 107/61    Pulse: 80 (!) 112    Resp: (!) 30 (!) 21    Temp:    98.3 F (36.8 C)  TempSrc:    Oral  SpO2: 100% 100% 99%   Weight: 48.1 kg     Height:        Intake/Output Summary (Last 24 hours) at 10/19/2018 1023 Last data filed at 10/19/2018 0000 Gross per 24 hour  Intake 450 ml  Output --  Net 450 ml     Wt Readings from Last 3 Encounters:  10/19/18 48.1 kg  10/04/18 49.3 kg  09/24/18 50.3 kg     Exam  General: Alert and oriented, NAD  Eyes:   HEENT:  Atraumatic, normocephali  Cardiovascular: S1 S2 auscultated, no murmurs, RRR  Respiratory: Bilateral coarse breath sounds  Gastrointestinal: Soft, nontender, nondistended, + bowel sounds  Ext: 1+ pedal edema bilaterally  Neuro:   Musculoskeletal: No digital cyanosis, clubbing  Skin: No rashes  Psych: Flat affect   Data Reviewed:  I have personally reviewed following labs and imaging studies  Micro Results No results found for this or any previous visit (from the past 240 hour(s)).  Radiology Reports Dg Chest 2 View  Result Date: 10/05/2018 CLINICAL DATA:  Suspected sepsis EXAM: CHEST - 2 VIEW COMPARISON:  10/02/2018, 10/01/2018, 09/19/2018, 05/28/2018, 06/03/2017, 03/11/2013, 10/15/2017, 07/12/2018 FINDINGS: Small right-sided pleural effusion. Underlying reticular interstitial changes suggesting component of chronic disease, however there  is interim worsening of bilateral right greater than left airspace disease, suspicious for acute edema or pneumonia superimposed on chronic underlying disease. Stable cardiomediastinal silhouette. No pneumothorax. IMPRESSION: 1. Interval worsening of bilateral right greater than left airspace disease, suspicious for acute edema or pneumonia superimposed on underlying chronic disease. Small right-sided pleural effusion. Electronically Signed   By: Maudie Mercury  Francoise Ceo M.D.   On: 10/05/2018 23:52   Dg Chest 2 View  Result Date: 09/19/2018 CLINICAL DATA:  Hypotensive, recent pneumonia EXAM: CHEST - 2 VIEW COMPARISON:  Chest radiograph 08/26/2018 FINDINGS: Heart size within normal limits. Ill-defined and subtly nodular pulmonary opacities are again demonstrated, more prominent within the right lung. Findings are similar to prior chest radiograph 09/03/2018 and suspicious for infection. No evidence of pneumothorax or pleural effusion. No acute bony abnormality. IMPRESSION: Ill-defined and subtly nodular pulmonary opacities are similar to prior exam 08/26/2018 and suspicious for infection, suspected atypical pneumonia. Electronically Signed   By: Kellie Simmering   On: 09/19/2018 13:33   Dg Abd 1 View  Result Date: 10/15/2018 CLINICAL DATA:  Check feeding tube placement EXAM: ABDOMEN - 1 VIEW COMPARISON:  10/07/2018 FINDINGS: Nasogastric catheter has been removed. A weighted feeding catheter is now seen within the mid stomach. Scattered large and small bowel gas is noted. No bony abnormality is seen. IMPRESSION: Feeding catheter within the mid stomach. Electronically Signed   By: Inez Catalina M.D.   On: 10/15/2018 14:05   Dg Abd 1 View  Result Date: 10/07/2018 CLINICAL DATA:  Abdominal distention. OG tube placement. EXAM: ABDOMEN - 1 VIEW COMPARISON:  CT scan of the abdomen dated 08/02/2018 FINDINGS: There is gaseous distention of the stomach. The tip of the OG tube lies in the distal stomach. No dilated large or small  bowel. Hepatosplenomegaly. Infiltrates at the lung bases. Bones are normal. IMPRESSION: OG tube tip lies in the distal stomach. Gaseous distention of the stomach. Electronically Signed   By: Lorriane Shire M.D.   On: 10/07/2018 09:58   Ct Head Wo Contrast  Result Date: 10/07/2018 CLINICAL DATA:  Altered mental status, intubation, HIV EXAM: CT HEAD WITHOUT CONTRAST TECHNIQUE: Contiguous axial images were obtained from the base of the skull through the vertex without intravenous contrast. COMPARISON:  None. FINDINGS: Brain: No evidence of acute infarction, hemorrhage, hydrocephalus, extra-axial collection or mass lesion/mass effect. Vascular: No hyperdense vessel or unexpected calcification. Skull: Normal. Negative for fracture or focal lesion. Sinuses/Orbits: No acute finding. Other: None. IMPRESSION: No acute intracranial pathology. Electronically Signed   By: Eddie Candle M.D.   On: 10/07/2018 16:28   Ct Chest Wo Contrast  Result Date: 10/16/2018 CLINICAL DATA:  Acute respiratory failure EXAM: CT CHEST WITHOUT CONTRAST TECHNIQUE: Multidetector CT imaging of the chest was performed following the standard protocol without IV contrast. COMPARISON:  None. FINDINGS: Cardiovascular: Heart is normal size.  Aorta is normal caliber. Mediastinum/Nodes: Enlarged mediastinal lymph nodes. Index right paratracheal node has a short axis diameter of 11 mm. These are stable since prior study. No axillary adenopathy. Difficult to assess the hila without intravenous contrast. Lungs/Pleura: Extensive interstitial thickening, ground-glass opacities. More confluent consolidation seen in the left upper lobe and both lower lobes. Trace bilateral effusions. Upper Abdomen: Imaging into the upper abdomen shows no acute findings. Probable splenomegaly. The spleen is not imaged in its entirety. Musculoskeletal: Chest wall soft tissues are unremarkable. No acute bony abnormality. IMPRESSION: Diffuse ground-glass airspace disease  throughout the lungs with interlobular septal thickening. More confluent consolidation in the left upper lobe and both superior segments of the lower lobes. This is similar to prior study and presumably reflects atypical infection there is mediastinal adenopathy which is also stable. Sarcoidosis could have a similar appearance in the appropriate clinical setting. Trace bilateral effusions. Probable splenomegaly. The spleen is not imaged in its entirety but appears enlarged in the visualized upper abdomen. Recommend  clinical correlation. Electronically Signed   By: Rolm Baptise M.D.   On: 10/16/2018 19:05   US Renal  Result Date: 10/09/2018 CLINICAL DATA:  Acute kidney injury. EXAM: RENAL / URINARY TRACT ULTRASOUND COMPLETE COMPARISON:  08/08/2018 FINDINGS: Right Kidney: Renal measurements: 11.2 x 4.4 x 5.8 cm = volume: 149 mL. Echogenicity is diffusely increased. No hydronephrosis. Left Kidney: Renal measurements: 12.4 x 5.5 x 4.6 cm = volume: 163 mL. Diffusely increased echogenicity without hydronephrosis. Bladder: Decompressed by Foley catheter. Note: Small to moderate bilateral pleural effusions noted. IMPRESSION: 1. Bilateral echogenic kidneys without hydronephrosis. Imaging features compatible with medical renal disease. 2. Bilateral pleural effusions. Electronically Signed   By: Misty Stanley M.D.   On: 10/09/2018 20:03   Dg Chest Port 1 View  Result Date: 10/15/2018 CLINICAL DATA:  Hypoxia. EXAM: PORTABLE CHEST 1 VIEW COMPARISON:  Radiograph of October 14, 2018. FINDINGS: The heart size and mediastinal contours are within normal limits. Endotracheal and nasogastric tubes have been removed. Bilateral internal jugular catheters are unchanged in position. Stable bilateral bibasilar opacities are noted concerning for possible atelectasis or inflammation. The visualized skeletal structures are unremarkable. IMPRESSION: Endotracheal and nasogastric tubes have been removed. Stable bilateral lung opacities as  described above. Electronically Signed   By: Marijo Conception M.D.   On: 10/15/2018 07:08   Dg Chest Port 1 View  Result Date: 10/14/2018 CLINICAL DATA:  resp failure EXAM: PORTABLE CHEST 1 VIEW COMPARISON:  10/13/2018 FINDINGS: Endotracheal tube is in place, tip 4.8 centimeters above the carina. RIGHT IJ central line tip overlies the superior vena cava. LEFT IJ central line tip overlies the superior vena cava. Patient is rotated towards the LEFT. Nasogastric tube tip is off the image. The heart is normal in size. Diffuse ground-glass opacities are identified throughout the lungs bilaterally without significant change. No new consolidation. IMPRESSION: Stable bilateral pulmonary infiltrates. Electronically Signed   By: Nolon Nations M.D.   On: 10/14/2018 09:03   Dg Chest Port 1 View  Result Date: 10/13/2018 CLINICAL DATA:  Acute kidney injury secondary to shock and hypoperfusion. Intubated patient. EXAM: PORTABLE CHEST 1 VIEW COMPARISON:  Single-view of the chest 10/12/2018 and 10/10/2018. FINDINGS: Support tubes and lines are unchanged and project in good position. Diffuse hazy bilateral airspace disease has improved. No pneumothorax or pleural fluid. Heart size is normal. IMPRESSION: No change in support apparatus. Improved bilateral airspace disease. Electronically Signed   By: Inge Rise M.D.   On: 10/13/2018 08:33   Dg Chest Port 1 View  Result Date: 10/12/2018 CLINICAL DATA:  Acute respiratory failure EXAM: PORTABLE CHEST 1 VIEW COMPARISON:  Two days ago FINDINGS: Endotracheal tube tip between the clavicular heads and carina. Bilateral central line in the IJ with tips at the SVC. The orogastric tube reaches the stomach. Diffuse hazy opacification of the lungs but significantly improved aeration from prior. No visible pleural fluid or air leak. IMPRESSION: 1. Unremarkable hardware positioning. 2. Diffuse airspace disease that is improved from 2 days ago. Electronically Signed   By: Monte Fantasia M.D.   On: 10/12/2018 08:16   Dg Chest Port 1 View  Result Date: 10/10/2018 CLINICAL DATA:  ET positioning EXAM: PORTABLE CHEST 1 VIEW COMPARISON:  Radiograph October 08, 2018 FINDINGS: Endotracheal tube is positioned in the mid trachea approximately 4 cm from the carina right IJ catheter terminates at the superior cavoatrial junction. Left IJ dual lumen catheter terminates at the caval-brachiocephalic confluence. Transesophageal tube tip and side port distal to the  GE junction. Pacer pad overlies the left chest. Persistent bilateral airspace opacities demonstrate some interval clearing in the upper lobes. Basilar opacities are similar to prior. Cardiomediastinal silhouette is largely obscured by overlying opacity. No visible pneumothorax or discernible effusion. No acute osseous or soft tissue abnormality. IMPRESSION: 1. Satisfactory positioning of the lines and tubes, as above. 2. Persistent bilateral airspace opacities with some interval clearing in the upper lobes. Electronically Signed   By: Lovena Le M.D.   On: 10/10/2018 05:27   Dg Chest Port 1 View  Result Date: 10/09/2018 CLINICAL DATA:  ARDS EXAM: PORTABLE CHEST 1 VIEW COMPARISON:  Radiograph 10/08/2018 FINDINGS: Pacer pads overlie the chest. Right IJ catheter tip terminates at the level of the superior cavoatrial junction. A left IJ dual lumen catheter tip terminates near the brachiocephalic-caval confluence. Endotracheal tube is positioned in the mid trachea, 3.8 cm from the carina. A transesophageal tube tip and side port are distal to the GE junction. There are diffuse consolidative opacities throughout both lungs on a background of more hazy interstitial opacity. Suspect small right effusion. No pneumothorax. Cardiac silhouette is largely obscured by the overlying opacities. IMPRESSION: Extensive bilateral airspace opacities, similar in extent to most recent comparison. Stable, satisfactory positioning of lines and tubes, as above  Electronically Signed   By: Lovena Le M.D.   On: 10/09/2018 05:40   Dg Chest Port 1 View  Result Date: 10/08/2018 CLINICAL DATA:  CVL placement EXAM: PORTABLE CHEST 1 VIEW COMPARISON:  10/08/2018, 10/07/2018, 10/05/2018 FINDINGS: Endotracheal tube tip is about 4.3 cm superior to the carina. Esophageal tube tip below the diaphragm but non included. Right IJ central venous catheter tip over the SVC. Left IJ central venous catheter tip over the upper SVC. No left pneumothorax. Extensive bilateral interstitial and alveolar disease. Obscured cardiomediastinal silhouette. Probable small effusions. IMPRESSION: 1. New left IJ central venous catheter with tip over the SVC. No pneumothorax. Support lines and tubes otherwise grossly stable in position 2. No significant change in extensive interstitial and alveolar disease bilaterally. Electronically Signed   By: Donavan Foil M.D.   On: 10/08/2018 14:06   Dg Chest Port 1 View  Result Date: 10/08/2018 CLINICAL DATA:  Check endotracheal tube placement EXAM: PORTABLE CHEST 1 VIEW COMPARISON:  10/07/2018 FINDINGS: Cardiac shadow is stable. Right jugular central line, gastric catheter and endotracheal tube are again seen. The endotracheal tube now lies 0.6 cm above the carina slightly withdrawn when compared with the prior exam. Persistent bilateral airspace opacities are noted worst in the right lung base stable from the previous day. Small right-sided effusion is noted superiorly. Bony abnormality is noted. IMPRESSION: Tubes and lines as described above. Stable airspace opacities bilaterally. Electronically Signed   By: Inez Catalina M.D.   On: 10/08/2018 08:43   Portable Chest X-ray  Result Date: 10/07/2018 CLINICAL DATA:  Intubation EXAM: PORTABLE CHEST 1 VIEW COMPARISON:  October 06, 2018 FINDINGS: ET tube is 3 cm above the level of carina. Enteric tube is seen coursing below the diaphragm. A right-sided PICC is seen at the superior cavoatrial junction. Patchy  airspace opacities are seen throughout both lungs, right greater than left. The cardiomediastinal silhouette is unchanged. IMPRESSION: ET tube 3 cm above the carina. Unchanged patchy airspace opacities, right greater than left. Electronically Signed   By: Prudencio Pair M.D.   On: 10/07/2018 10:05   Dg Chest Port 1 View  Result Date: 10/06/2018 CLINICAL DATA:  33 year old female with centralized placement. EXAM: PORTABLE CHEST 1 VIEW  COMPARISON:  Chest radiograph dated 10/05/2018 FINDINGS: Right IJ central venous line with tip over the cavoatrial junction. No pneumothorax. Bilateral airspace opacities similar to prior radiograph. There is a small right pleural effusion. No acute osseous pathology. IMPRESSION: 1. Interval placement of a right IJ central venous line with tip in the region of the cavoatrial junction. No pneumothorax. 2. No significant interval change in the bilateral airspace opacities. Electronically Signed   By: Anner Crete M.D.   On: 10/06/2018 04:04   Dg Chest Port 1 View  Result Date: 10/02/2018 CLINICAL DATA:  Tachypnea EXAM: PORTABLE CHEST 1 VIEW COMPARISON:  Yesterday FINDINGS: Generalized reticular pulmonary opacity, present on multiple prior studies. No pleural fluid or pneumothorax. Normal heart size. IMPRESSION: Chronic reticular pulmonary opacity without acute superimposed finding. Electronically Signed   By: Monte Fantasia M.D.   On: 10/02/2018 05:45   Dg Chest Port 1 View  Result Date: 10/01/2018 CLINICAL DATA:  Cough, pancytopenia EXAM: PORTABLE CHEST 1 VIEW COMPARISON:  09/19/2018 FINDINGS: Stable cardiomediastinal contours. Extensive, diffuse reticulonodular opacities throughout both lungs, markedly progressed from prior. No pleural effusion. No pneumothorax. Osseous structures intact. IMPRESSION: Extensive reticulonodular opacities throughout both lungs suggestive of an atypical infection in an immunocompromised patient. Electronically Signed   By: Davina Poke  M.D.   On: 10/01/2018 18:38   Dg Swallowing Func-speech Pathology  Result Date: 10/18/2018 Objective Swallowing Evaluation: Type of Study: MBS-Modified Barium Swallow Study  Patient Details Name: Aneesha Holloran MRN: [id number] Date of Birth: April 06, 1985 Today's Date: 10/18/2018 Time: SLP Start Time (ACUTE ONLY): 1000 -SLP Stop Time (ACUTE ONLY): 6712 SLP Time Calculation (min) (ACUTE ONLY): 15 min Past Medical History: Past Medical History: Diagnosis Date  Acute hyponatremia 06/03/2017  Anemia   CAP (community acquired pneumonia) 06/03/2017  Facial dermatitis 06/03/2017  HIV (human immunodeficiency virus infection) ([place])   diagnosed in April 2019  Pleurisy 06/03/2017  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 ([place])   Thrush of mouth and esophagus ([place])   UTI (urinary tract infection)  Past Surgical History: Past Surgical History: Procedure Laterality Date  BIOPSY  07/12/2018  Procedure: BIOPSY;  Surgeon: Laurin Coder, MD;  Location: [facility];  Service: Endoscopy;;  BRONCHIAL WASHINGS  07/12/2018  Procedure: BRONCHIAL WASHINGS;  Surgeon: Laurin Coder, MD;  Location: [facility];  Service: Endoscopy;;  ENDOBRONCHIAL ULTRASOUND N/A 07/12/2018  Procedure: ENDOBRONCHIAL ULTRASOUND;  Surgeon: Laurin Coder, MD;  Location: [facility];  Service: Endoscopy;  Laterality: N/A;  FINE NEEDLE ASPIRATION BIOPSY  07/12/2018  Procedure: FINE NEEDLE ASPIRATION BIOPSY;  Surgeon: Laurin Coder, MD;  Location: [facility];  Service: Endoscopy;;  NO PAST SURGERIES    VIDEO BRONCHOSCOPY N/A 07/12/2018  Procedure: VIDEO BRONCHOSCOPY WITHOUT FLUORO;  Surgeon: Laurin Coder, MD;  Location: [facility];  Service: Endoscopy;  Laterality: N/A; HPI: 33 yo F with history of HIV/AIDS admitted on 8/29 with progressive shortness of breath. History of multiple admissions over the last year for waxing / waning pulmonary infiltrates. Patient decompensated on 8/31 requiring initiation of mechanical  intubation and later pressure support and CRRT, was extubated 10/14/2018.  Swallow eval ordered.  Pt denies h/o dysphagia.  .Pt was taken off CRRT yesterday.  Today is tachycardic.  She reports she is receiving ice chips and her spouse gave her "lots of them" yesterday.  Pt had a small bore feeding tube for nutrition, however, this was found out thursday 10/17/2018 night. Pt to have MBS today.  Subjective: Pt seen in radiology for MBS.  RN in attendance. Assessment / Plan / Recommendation CHL IP CLINICAL IMPRESSIONS 10/18/2018 Clinical Impression Pt presents with essentially normal oropharyngeal swallow function. One episode of very trace flash penetration was noted with large straw sips, however, penetrate cleared spontaneously, and no other episodes of penetration were noted. Otherwise, normal swallow without oral residue, post-swallow pharyngeal residue, or aspiration noted. Recommend beginning regular texture solids and thin liquids, meds whole with liquid. Safe swallow precautions posted at [facility] and reviewed with pt and her mother. SLP will follow for diet tolerance and education.  SLP Visit Diagnosis Dysphagia, oropharyngeal phase (R13.12)     Impact on safety and function Mild aspiration risk   CHL IP TREATMENT RECOMMENDATION 10/18/2018 Treatment Recommendations Therapy as outlined in treatment plan below   Prognosis 10/15/2018 Prognosis for Safe Diet Advancement Good     CHL IP DIET RECOMMENDATION 10/18/2018 SLP Diet Recommendations Regular solids Liquid Administration via Straw;Cup Medication Administration Whole meds with liquid Compensations Slow rate;Small sips/bites;Minimize environmental distractions Postural Changes Seated upright at 90 degrees   CHL IP OTHER RECOMMENDATIONS 10/18/2018 Recommended Consults -- Oral Care Recommendations Oral care QID Other Recommendations --   CHL IP FOLLOW UP RECOMMENDATIONS 10/18/2018 Follow up Recommendations None   CHL IP FREQUENCY AND DURATION 10/18/2018 Speech Therapy  Frequency (ACUTE ONLY) min 1 x/week Treatment Duration 1 week;2 weeks      CHL IP ORAL PHASE 10/18/2018 Oral Phase WFL  CHL IP PHARYNGEAL PHASE 10/18/2018 Pharyngeal Phase Impaired Pharyngeal- Nectar Cup WFL Pharyngeal- Thin Cup Reduced airway/laryngeal closure;Penetration/Aspiration during swallow Pharyngeal Material enters airway, remains ABOVE vocal cords then ejected out;Material does not enter airway Pharyngeal- Puree WFL Pharyngeal- Regular WFL  CHL IP CERVICAL ESOPHAGEAL PHASE 10/18/2018 Cervical Esophageal Phase [facility] Celia B. Quentin [facility], CCC-SLP Speech Language Pathologist [phone] [name] 10/18/2018, 10:40 AM              Vas [place] Lower Extremity Venous (dvt)  Result Date: 10/07/2018  Lower Venous Study Indications: SOB, and Edema.  Risk Factors: Advanced HIV/AIDS. Limitations: Lights on in room, patient newly vented. Comparison Study: Prior study from 06/20/17 is available for comparison Performing Technologist: Sharion Dove RVS  Examination Guidelines: A complete evaluation includes B-mode imaging, spectral Doppler, color Doppler, and power Doppler as needed of all accessible portions of each vessel. Bilateral testing is considered an integral part of a complete examination. Limited examinations for reoccurring indications may be performed as noted.  +---------+---------------+---------+-----------+----------+--------------+  RIGHT     Compressibility Phasicity Spontaneity Properties Thrombus Aging  +---------+---------------+---------+-----------+----------+--------------+  CFV       Full            Yes       Yes                                    +---------+---------------+---------+-----------+----------+--------------+  SFJ       Full                                                             +---------+---------------+---------+-----------+----------+--------------+  FV Prox   Full                                                              +---------+---------------+---------+-----------+----------+--------------+  FV Mid    Full                                                             +---------+---------------+---------+-----------+----------+--------------+  FV Distal Full                                                             +---------+---------------+---------+-----------+----------+--------------+  PFV       Full                                                             +---------+---------------+---------+-----------+----------+--------------+  POP       Full            Yes       Yes                                    +---------+---------------+---------+-----------+----------+--------------+  PTV       Full                                                             +---------+---------------+---------+-----------+----------+--------------+  PERO      Full                                                             +---------+---------------+---------+-----------+----------+--------------+   +---------+---------------+---------+-----------+----------+--------------+  LEFT      Compressibility Phasicity Spontaneity Properties Thrombus Aging  +---------+---------------+---------+-----------+----------+--------------+  CFV       Full            Yes       Yes                                    +---------+---------------+---------+-----------+----------+--------------+  SFJ       Full                                                             +---------+---------------+---------+-----------+----------+--------------+  FV Prox   Full                                                             +---------+---------------+---------+-----------+----------+--------------+  FV Mid    Full                                                             +---------+---------------+---------+-----------+----------+--------------+  FV Distal Full                                                              +---------+---------------+---------+-----------+----------+--------------+  PFV       Full                                                             +---------+---------------+---------+-----------+----------+--------------+  POP       Full            Yes       Yes                                    +---------+---------------+---------+-----------+----------+--------------+  PTV       Full                                                             +---------+---------------+---------+-----------+----------+--------------+  PERO                                                       Not visualized  +---------+---------------+---------+-----------+----------+--------------+     Summary: Right: Findings appear essentially unchanged compared to previous examination. There is no evidence of deep vein thrombosis in the lower extremity. Left: Findings appear essentially unchanged compared to previous examination. There is no evidence of deep vein thrombosis in the lower extremity. However, portions of this examination were limited- see technologist comments above.  *See table(s) above for measurements and observations. Electronically signed by Monica Martinez MD on 10/07/2018 at 6:03:03 PM.    Final    Vas [place] Upper Extremity Venous Duplex  Result Date: 10/15/2018 UPPER VENOUS STUDY  Indications: Edema Limitations: Bandages and Edema and size of the veins. Comparison Study: No previous study available Performing Technologist: Toma Copier RVS  Examination Guidelines: A complete evaluation includes B-mode imaging, spectral Doppler, color Doppler, and power Doppler as needed of all accessible portions of each vessel. Bilateral testing is considered an integral part of a complete examination. Limited examinations for reoccurring indications may be performed as noted.  Right Findings: +----------+------------+---------+-----------+----------+---------------------+  RIGHT       Compressible Phasicity Spontaneous Properties        Summary         +----------+------------+---------+-----------+----------+---------------------+  IJV  Not visualized due to                                                               dialysis access     +----------+------------+---------+-----------+----------+---------------------+  Subclavian     Full        Yes        Yes                                       +----------+------------+---------+-----------+----------+---------------------+  Axillary       Full        Yes        Yes                                       +----------+------------+---------+-----------+----------+---------------------+  Brachial       Full        Yes        Yes                                       +----------+------------+---------+-----------+----------+---------------------+  Radial         Full                                                             +----------+------------+---------+-----------+----------+---------------------+  Ulnar          Full                                                             +----------+------------+---------+-----------+----------+---------------------+  Cephalic                                                 Not visualized due to                                                            edema and size of the                                                                    vein           +----------+------------+---------+-----------+----------+---------------------+  Basilic                                                  Not visualized due to                                                            the size of the vein   +----------+------------+---------+-----------+----------+---------------------+ Technically limited due to dialysis access in the neck, size and edema.  Left Findings: +----------+------------+---------+-----------+----------+---------------------+  LEFT        Compressible Phasicity Spontaneous Properties        Summary         +----------+------------+---------+-----------+----------+---------------------+  IJV                                                      Not visualized due to                                                                IV placement       +----------+------------+---------+-----------+----------+---------------------+  Subclavian                                               Not visualized due to                                                               bandages for IV                                                                     placement        +----------+------------+---------+-----------+----------+---------------------+  Axillary       Full        Yes        Yes                                       +----------+------------+---------+-----------+----------+---------------------+  Brachial       Full        Yes        Yes                                       +----------+------------+---------+-----------+----------+---------------------+  Radial         Full                                                             +----------+------------+---------+-----------+----------+---------------------+  Ulnar          Full                                                             +----------+------------+---------+-----------+----------+---------------------+  Cephalic                                                 Not visualize due to                                                                size and edema      +----------+------------+---------+-----------+----------+---------------------+  Basilic                                                  Not visualized due to                                                               size and edema      +----------+------------+---------+-----------+----------+---------------------+ Technically limited due to bandages, IV placement, size. and edema  Summary:  Right: No evidence  of deep vein thrombosis in the upper extremity. However, unable to visualize all veins. See comments listed above. No evidence of superficial vein thrombosis . However, unable to visualize See comments listed above. No evidence of thrombosis in the subclavian. This was a limited study.  Left: No evidence of deep vein thrombosis in the upper extremity. However, unable to visualize all veins. See comments listed avone. No evidence of superficial vein thrombosis . However, unable to visualize all veins. See comments listed avone. Unable to visualize the subclavian/ See comments listed abone. This was a limited study.  *See table(s) above for measurements and observations.  Diagnosing physician: Servando Snare MD Electronically signed by Servando Snare MD on 10/15/2018 at 1:15:33 PM.    Final    [place] Ekg Site Rite  Result Date: 10/18/2018 If Site Rite image not attached, placement could not be confirmed due to current cardiac rhythm.   Lab Data:  CBC: Recent Labs  Lab 10/15/18 0330 10/16/18 0725 10/17/18 0423 10/17/18 1500 10/18/18 0425 10/19/18 0430  WBC 25.3* 16.8* 16.5*  --  13.5* 12.3*  NEUTROABS  --  13.4* 13.5*  --   --   --   HGB 8.1* 7.8* 6.1* 8.1* 8.1* 8.2*  HCT 25.0* 24.6* 19.3* 25.1* 25.2* 25.4*  MCV 93.6 96.5 97.5  --  93.0 95.1  PLT 16* 21* 26*  --  30* 45*   Basic Metabolic Panel: Recent Labs  Lab 10/15/18 0330 10/15/18 1732 10/16/18 0350 10/17/18 0423 10/18/18 0425 10/19/18 0430  NA 137   137 136 134* 138 142 142  K 3.9   4.0 3.8 4.2 4.4 3.7 3.4*  CL 102   103 101 101 104 106 110  CO2 _0 20*  GLUCOSE 102*   100* 177* 173* 123* 150* 123*  BUN 27*   28* 27* 31* 58* 78* 67*  CREATININE 1.06*   1.13* 1.04* 1.09* 1.57* 1.52* 1.27*  CALCIUM 7.7*   7.6* 7.9* 7.7* 7.3* 8.3* 8.1*  MG 2.4  --  2.5* 2.0 1.8 1.2*  PHOS 3.6   3.6 2.3* 2.4* 2.6 3.4 3.2   GFR: Estimated Creatinine Clearance: 48.4 mL/min (A) (by C-G formula based on SCr of 1.27 mg/dL (H)). Liver  Function Tests: Recent Labs  Lab 10/13/18 0939  10/15/18 1732 10/16/18 0350 10/17/18 0423 10/18/18 0425 10/19/18 0430  AST 59*  --   --  58*  --   --   --   ALT 32  --   --  35  --   --   --   ALKPHOS 442*  --   --  370*  --   --   --   BILITOT 3.4*  --   --  2.5*  --   --   --   PROT 6.0*  --   --  6.8  --   --   --   ALBUMIN 2.1*   < > 2.3* 2.4*   2.5* 2.3* 2.3* 2.3*   < > = values in this interval not displayed.   No results for input(s): LIPASE, AMYLASE in the last 168 hours. No results for input(s): AMMONIA in the last 168 hours. Coagulation Profile: No results for input(s): INR, PROTIME in the last 168 hours. Cardiac Enzymes: No results for input(s): CKTOTAL, CKMB, CKMBINDEX, TROPONINI in the last 168 hours. BNP (last 3 results) No results for input(s): PROBNP in the last 8760 hours. HbA1C: No results for input(s): HGBA1C in the last 72 hours. CBG: Recent Labs  Lab 10/18/18 1950 10/18/18 2330 10/19/18 0250 10/19/18 0431 10/19/18 0757  GLUCAP 255* 177* 63* 117* 77   Lipid Profile: No results for input(s): CHOL, HDL, LDLCALC, TRIG, CHOLHDL, LDLDIRECT in the last 72 hours. Thyroid Function Tests: No results for input(s): TSH, T4TOTAL, FREET4, T3FREE, THYROIDAB in the last 72 hours. Anemia Panel: No results for input(s): VITAMINB12, FOLATE, FERRITIN, TIBC, IRON, RETICCTPCT in the last 72 hours. Urine analysis:    Component Value Date/Time   COLORURINE AMBER (A) 10/01/2018 1832   APPEARANCEUR HAZY (A) 10/01/2018 1832   LABSPEC 1.006 10/01/2018 1832   PHURINE 5.0 10/01/2018 1832   GLUCOSEU NEGATIVE 10/01/2018 1832   HGBUR NEGATIVE 10/01/2018 1832   BILIRUBINUR NEGATIVE 10/01/2018 1832   KETONESUR NEGATIVE 10/01/2018 [place] NEGATIVE 10/01/2018 1832   UROBILINOGEN 1.0 12/03/2011 0550   NITRITE NEGATIVE 10/01/2018 1832   LEUKOCYTESUR NEGATIVE 10/01/2018 1832     Tomica Arseneault M.D. [facility] 10/19/2018, 10:23 AM  Pager: [phone] Between  7am to 7pm - call Pager - [phone]  After 7pm go to [email or web address] - password [email or web address]  Call night coverage person covering after 7pm

## 2018-10-19 NOTE — Progress Notes (Signed)
HEMATOLOGY-ONCOLOGY PROGRESS NOTE DOS  SUBJECTIVE:   Patient was seen evening on 9/11. Resting comfortable in bed on 2L/min [place] oxygen. Eating fruit cup. Notes that he is feeling brighter and appetite is improving. No fevers. Tolerating Dexamethasone without any acute issues. Some throat soreness from intubation. Platelets are progressively improving. Off CRRT - renal following.  REVIEW OF SYSTEMS:   Fatigue grade 1, no acute SOB at rest, no fevers.  I have reviewed the past medical history, past surgical history, social history and family history with the patient and they are unchanged from previous note.   PHYSICAL EXAMINATION:  Vitals:   10/19/18 0720 10/19/18 0800  BP:    Pulse:    Resp:    Temp:  98.3 F (36.8 C)  SpO2: 99%    Filed Weights   10/17/18 0405 10/18/18 0100 10/19/18 0400  Weight: 107 lb 2.3 oz (48.6 kg) 102 lb 15.3 oz (46.7 kg) 106 lb 0.7 oz (48.1 kg)  .Temp (48hrs), Avg:98.3 F (36.8 C), Min:97.7 F (36.5 C), Max:99.1 F (37.3 C)    Intake/Output from previous day: 09/11 0701 - 09/12 0700 In: 450 [P.O.:450] Out: -   GENERAL: sitting up comfortably in bed, no distress LUNGS: Clear to auscultation  HEART: Tachycardic and 1-2+ lower extremity edema ABDOMEN:abdomen soft, non-tender and normal bowel sounds Musculoskeletal:no cyanosis of digits and no clubbing  NEURO: alert oriented x 3  LABORATORY DATA:  I have reviewed the data as listed  . CBC Latest Ref Rng & Units 10/19/2018 10/18/2018 10/17/2018  WBC 4.0 - 10.5 K/uL 12.3(H) 13.5(H) -  Hemoglobin 12.0 - 15.0 g/dL 8.2(L) 8.1(L) 8.1(L)  Hematocrit 36.0 - 46.0 % 25.4(L) 25.2(L) 25.1(L)  Platelets 150 - 400 K/uL 45(L) 30(L) -    . CMP Latest Ref Rng & Units 10/19/2018 10/18/2018 10/17/2018  Glucose 70 - 99 mg/dL 123(H) 150(H) 123(H)  BUN 6 - 20 mg/dL 67(H) 78(H) 58(H)  Creatinine 0.44 - 1.00 mg/dL 1.27(H) 1.52(H) 1.57(H)  Sodium 135 - 145 mmol/L 142 142 138  Potassium 3.5 - 5.1 mmol/L 3.4(L) 3.7  4.4  Chloride 98 - 111 mmol/L 110 106 104  CO2 22 - 32 mmol/L 20(L) 23 22  Calcium 8.9 - 10.3 mg/dL 8.1(L) 8.3(L) 7.3(L)  Total Protein 6.5 - 8.1 g/dL - - -  Total Bilirubin 0.3 - 1.2 mg/dL - - -  Alkaline Phos 38 - 126 U/L - - -  AST 15 - 41 U/L - - -  ALT 0 - 44 U/L - - -           Component     Latest Ref Rng & Units 10/07/2018  EBV DNA QN by PCR     Negative copies/mL 1,033  log10 EBV DNA Qn PCR     log10 copy/mL 3.014  CMV Quant DNA PCR (Plasma)     Negative IU/mL 144,000  Log10 CMV Qn DNA Pl     log10 IU/mL 5.158  Interleukin-6, Plasma     0.0 - 12.2 pg/mL 771.6 (H)   [facility] test code [id number]   [facility] test name SARS COV2 AB,IGG   Comment: Performed at [facility], [place] [address], [place], [place] [address]  Misc [facility] result COMMENT   Comment: (NOTE)  Test Ordered: [id number] SARS-CoV-2 Antibody, IgG  SARS-CoV-2 Antibody, IgG    Negative        [facility] test name INTERLEUKIN 2 SOLUBLE RECEPTOR A   Comment: Performed at [facility], [place] [address]  [place], [place] [address]  [place] result COMMENT   Comment: (NOTE)  Test Ordered: [id number] IL-2 Receptor Alpha  IL-2 Receptor Alpha      6069    [facility] ] U/mL   BN    Reference Range: [id number]        Dg Chest 2 View  Result Date: 10/05/2018 CLINICAL DATA:  Suspected sepsis EXAM: CHEST - 2 VIEW COMPARISON:  10/02/2018, 10/01/2018, 09/19/2018, 05/28/2018, 06/03/2017, 03/11/2013, 10/15/2017, 07/12/2018 FINDINGS: Small right-sided pleural effusion. Underlying reticular interstitial changes suggesting component of chronic disease, however there is interim worsening of bilateral right greater than left airspace disease, suspicious for acute edema or pneumonia superimposed on chronic underlying disease. Stable cardiomediastinal silhouette. No pneumothorax. IMPRESSION: 1. Interval worsening of bilateral right greater than left airspace disease, suspicious for  acute edema or pneumonia superimposed on underlying chronic disease. Small right-sided pleural effusion. Electronically Signed   By: Donavan Foil M.D.   On: 10/05/2018 23:52   Dg Chest 2 View  Result Date: 09/19/2018 CLINICAL DATA:  Hypotensive, recent pneumonia EXAM: CHEST - 2 VIEW COMPARISON:  Chest radiograph 08/26/2018 FINDINGS: Heart size within normal limits. Ill-defined and subtly nodular pulmonary opacities are again demonstrated, more prominent within the right lung. Findings are similar to prior chest radiograph 09/03/2018 and suspicious for infection. No evidence of pneumothorax or pleural effusion. No acute bony abnormality. IMPRESSION: Ill-defined and subtly nodular pulmonary opacities are similar to prior exam 08/26/2018 and suspicious for infection, suspected atypical pneumonia. Electronically Signed   By: Kellie Simmering   On: 09/19/2018 13:33   Dg Abd 1 View  Result Date: 10/15/2018 CLINICAL DATA:  Check feeding tube placement EXAM: ABDOMEN - 1 VIEW COMPARISON:  10/07/2018 FINDINGS: Nasogastric catheter has been removed. A weighted feeding catheter is now seen within the mid stomach. Scattered large and small bowel gas is noted. No bony abnormality is seen. IMPRESSION: Feeding catheter within the mid stomach. Electronically Signed   By: Inez Catalina M.D.   On: 10/15/2018 14:05   Dg Abd 1 View  Result Date: 10/07/2018 CLINICAL DATA:  Abdominal distention. OG tube placement. EXAM: ABDOMEN - 1 VIEW COMPARISON:  CT scan of the abdomen dated 08/02/2018 FINDINGS: There is gaseous distention of the stomach. The tip of the OG tube lies in the distal stomach. No dilated large or small bowel. Hepatosplenomegaly. Infiltrates at the lung bases. Bones are normal. IMPRESSION: OG tube tip lies in the distal stomach. Gaseous distention of the stomach. Electronically Signed   By: Lorriane Shire M.D.   On: 10/07/2018 09:58   Ct Head Wo Contrast  Result Date: 10/07/2018 CLINICAL DATA:  Altered mental  status, intubation, HIV EXAM: CT HEAD WITHOUT CONTRAST TECHNIQUE: Contiguous axial images were obtained from the base of the skull through the vertex without intravenous contrast. COMPARISON:  None. FINDINGS: Brain: No evidence of acute infarction, hemorrhage, hydrocephalus, extra-axial collection or mass lesion/mass effect. Vascular: No hyperdense vessel or unexpected calcification. Skull: Normal. Negative for fracture or focal lesion. Sinuses/Orbits: No acute finding. Other: None. IMPRESSION: No acute intracranial pathology. Electronically Signed   By: Eddie Candle M.D.   On: 10/07/2018 16:28   Ct Chest Wo Contrast  Result Date: 10/16/2018 CLINICAL DATA:  Acute respiratory failure EXAM: CT CHEST WITHOUT CONTRAST TECHNIQUE: Multidetector CT imaging of the chest was performed following the standard protocol without IV contrast. COMPARISON:  None. FINDINGS: Cardiovascular: Heart is normal size.  Aorta is normal caliber. Mediastinum/Nodes: Enlarged mediastinal lymph nodes. Index right paratracheal node has a short  axis diameter of 11 mm. These are stable since prior study. No axillary adenopathy. Difficult to assess the hila without intravenous contrast. Lungs/Pleura: Extensive interstitial thickening, ground-glass opacities. More confluent consolidation seen in the left upper lobe and both lower lobes. Trace bilateral effusions. Upper Abdomen: Imaging into the upper abdomen shows no acute findings. Probable splenomegaly. The spleen is not imaged in its entirety. Musculoskeletal: Chest wall soft tissues are unremarkable. No acute bony abnormality. IMPRESSION: Diffuse ground-glass airspace disease throughout the lungs with interlobular septal thickening. More confluent consolidation in the left upper lobe and both superior segments of the lower lobes. This is similar to prior study and presumably reflects atypical infection there is mediastinal adenopathy which is also stable. Sarcoidosis could have a similar  appearance in the appropriate clinical setting. Trace bilateral effusions. Probable splenomegaly. The spleen is not imaged in its entirety but appears enlarged in the visualized upper abdomen. Recommend clinical correlation. Electronically Signed   By: Rolm Baptise M.D.   On: 10/16/2018 19:05   US Renal  Result Date: 10/09/2018 CLINICAL DATA:  Acute kidney injury. EXAM: RENAL / URINARY TRACT ULTRASOUND COMPLETE COMPARISON:  08/08/2018 FINDINGS: Right Kidney: Renal measurements: 11.2 x 4.4 x 5.8 cm = volume: 149 mL. Echogenicity is diffusely increased. No hydronephrosis. Left Kidney: Renal measurements: 12.4 x 5.5 x 4.6 cm = volume: 163 mL. Diffusely increased echogenicity without hydronephrosis. Bladder: Decompressed by Foley catheter. Note: Small to moderate bilateral pleural effusions noted. IMPRESSION: 1. Bilateral echogenic kidneys without hydronephrosis. Imaging features compatible with medical renal disease. 2. Bilateral pleural effusions. Electronically Signed   By: Misty Stanley M.D.   On: 10/09/2018 20:03   Dg Chest Port 1 View  Result Date: 10/15/2018 CLINICAL DATA:  Hypoxia. EXAM: PORTABLE CHEST 1 VIEW COMPARISON:  Radiograph of October 14, 2018. FINDINGS: The heart size and mediastinal contours are within normal limits. Endotracheal and nasogastric tubes have been removed. Bilateral internal jugular catheters are unchanged in position. Stable bilateral bibasilar opacities are noted concerning for possible atelectasis or inflammation. The visualized skeletal structures are unremarkable. IMPRESSION: Endotracheal and nasogastric tubes have been removed. Stable bilateral lung opacities as described above. Electronically Signed   By: Marijo Conception M.D.   On: 10/15/2018 07:08   Dg Chest Port 1 View  Result Date: 10/14/2018 CLINICAL DATA:  resp failure EXAM: PORTABLE CHEST 1 VIEW COMPARISON:  10/13/2018 FINDINGS: Endotracheal tube is in place, tip 4.8 centimeters above the carina. RIGHT IJ central  line tip overlies the superior vena cava. LEFT IJ central line tip overlies the superior vena cava. Patient is rotated towards the LEFT. Nasogastric tube tip is off the image. The heart is normal in size. Diffuse ground-glass opacities are identified throughout the lungs bilaterally without significant change. No new consolidation. IMPRESSION: Stable bilateral pulmonary infiltrates. Electronically Signed   By: Nolon Nations M.D.   On: 10/14/2018 09:03   Dg Chest Port 1 View  Result Date: 10/13/2018 CLINICAL DATA:  Acute kidney injury secondary to shock and hypoperfusion. Intubated patient. EXAM: PORTABLE CHEST 1 VIEW COMPARISON:  Single-view of the chest 10/12/2018 and 10/10/2018. FINDINGS: Support tubes and lines are unchanged and project in good position. Diffuse hazy bilateral airspace disease has improved. No pneumothorax or pleural fluid. Heart size is normal. IMPRESSION: No change in support apparatus. Improved bilateral airspace disease. Electronically Signed   By: Inge Rise M.D.   On: 10/13/2018 08:33   Dg Chest Port 1 View  Result Date: 10/12/2018 CLINICAL DATA:  Acute respiratory failure  EXAM: PORTABLE CHEST 1 VIEW COMPARISON:  Two days ago FINDINGS: Endotracheal tube tip between the clavicular heads and carina. Bilateral central line in the IJ with tips at the SVC. The orogastric tube reaches the stomach. Diffuse hazy opacification of the lungs but significantly improved aeration from prior. No visible pleural fluid or air leak. IMPRESSION: 1. Unremarkable hardware positioning. 2. Diffuse airspace disease that is improved from 2 days ago. Electronically Signed   By: Monte Fantasia M.D.   On: 10/12/2018 08:16   Dg Chest Port 1 View  Result Date: 10/10/2018 CLINICAL DATA:  ET positioning EXAM: PORTABLE CHEST 1 VIEW COMPARISON:  Radiograph October 08, 2018 FINDINGS: Endotracheal tube is positioned in the mid trachea approximately 4 cm from the carina right IJ catheter terminates at the  superior cavoatrial junction. Left IJ dual lumen catheter terminates at the caval-brachiocephalic confluence. Transesophageal tube tip and side port distal to the GE junction. Pacer pad overlies the left chest. Persistent bilateral airspace opacities demonstrate some interval clearing in the upper lobes. Basilar opacities are similar to prior. Cardiomediastinal silhouette is largely obscured by overlying opacity. No visible pneumothorax or discernible effusion. No acute osseous or soft tissue abnormality. IMPRESSION: 1. Satisfactory positioning of the lines and tubes, as above. 2. Persistent bilateral airspace opacities with some interval clearing in the upper lobes. Electronically Signed   By: Lovena Le M.D.   On: 10/10/2018 05:27   Dg Chest Port 1 View  Result Date: 10/09/2018 CLINICAL DATA:  ARDS EXAM: PORTABLE CHEST 1 VIEW COMPARISON:  Radiograph 10/08/2018 FINDINGS: Pacer pads overlie the chest. Right IJ catheter tip terminates at the level of the superior cavoatrial junction. A left IJ dual lumen catheter tip terminates near the brachiocephalic-caval confluence. Endotracheal tube is positioned in the mid trachea, 3.8 cm from the carina. A transesophageal tube tip and side port are distal to the GE junction. There are diffuse consolidative opacities throughout both lungs on a background of more hazy interstitial opacity. Suspect small right effusion. No pneumothorax. Cardiac silhouette is largely obscured by the overlying opacities. IMPRESSION: Extensive bilateral airspace opacities, similar in extent to most recent comparison. Stable, satisfactory positioning of lines and tubes, as above Electronically Signed   By: Lovena Le M.D.   On: 10/09/2018 05:40   Dg Chest Port 1 View  Result Date: 10/08/2018 CLINICAL DATA:  CVL placement EXAM: PORTABLE CHEST 1 VIEW COMPARISON:  10/08/2018, 10/07/2018, 10/05/2018 FINDINGS: Endotracheal tube tip is about 4.3 cm superior to the carina. Esophageal tube tip  below the diaphragm but non included. Right IJ central venous catheter tip over the SVC. Left IJ central venous catheter tip over the upper SVC. No left pneumothorax. Extensive bilateral interstitial and alveolar disease. Obscured cardiomediastinal silhouette. Probable small effusions. IMPRESSION: 1. New left IJ central venous catheter with tip over the SVC. No pneumothorax. Support lines and tubes otherwise grossly stable in position 2. No significant change in extensive interstitial and alveolar disease bilaterally. Electronically Signed   By: Donavan Foil M.D.   On: 10/08/2018 14:06   Dg Chest Port 1 View  Result Date: 10/08/2018 CLINICAL DATA:  Check endotracheal tube placement EXAM: PORTABLE CHEST 1 VIEW COMPARISON:  10/07/2018 FINDINGS: Cardiac shadow is stable. Right jugular central line, gastric catheter and endotracheal tube are again seen. The endotracheal tube now lies 0.6 cm above the carina slightly withdrawn when compared with the prior exam. Persistent bilateral airspace opacities are noted worst in the right lung base stable from the previous day. Small right-sided  effusion is noted superiorly. Bony abnormality is noted. IMPRESSION: Tubes and lines as described above. Stable airspace opacities bilaterally. Electronically Signed   By: Inez Catalina M.D.   On: 10/08/2018 08:43   Portable Chest X-ray  Result Date: 10/07/2018 CLINICAL DATA:  Intubation EXAM: PORTABLE CHEST 1 VIEW COMPARISON:  October 06, 2018 FINDINGS: ET tube is 3 cm above the level of carina. Enteric tube is seen coursing below the diaphragm. A right-sided PICC is seen at the superior cavoatrial junction. Patchy airspace opacities are seen throughout both lungs, right greater than left. The cardiomediastinal silhouette is unchanged. IMPRESSION: ET tube 3 cm above the carina. Unchanged patchy airspace opacities, right greater than left. Electronically Signed   By: Prudencio Pair M.D.   On: 10/07/2018 10:05   Dg Chest Port 1  View  Result Date: 10/06/2018 CLINICAL DATA:  33 year old female with centralized placement. EXAM: PORTABLE CHEST 1 VIEW COMPARISON:  Chest radiograph dated 10/05/2018 FINDINGS: Right IJ central venous line with tip over the cavoatrial junction. No pneumothorax. Bilateral airspace opacities similar to prior radiograph. There is a small right pleural effusion. No acute osseous pathology. IMPRESSION: 1. Interval placement of a right IJ central venous line with tip in the region of the cavoatrial junction. No pneumothorax. 2. No significant interval change in the bilateral airspace opacities. Electronically Signed   By: Anner Crete M.D.   On: 10/06/2018 04:04   Dg Chest Port 1 View  Result Date: 10/02/2018 CLINICAL DATA:  Tachypnea EXAM: PORTABLE CHEST 1 VIEW COMPARISON:  Yesterday FINDINGS: Generalized reticular pulmonary opacity, present on multiple prior studies. No pleural fluid or pneumothorax. Normal heart size. IMPRESSION: Chronic reticular pulmonary opacity without acute superimposed finding. Electronically Signed   By: Monte Fantasia M.D.   On: 10/02/2018 05:45   Dg Chest Port 1 View  Result Date: 10/01/2018 CLINICAL DATA:  Cough, pancytopenia EXAM: PORTABLE CHEST 1 VIEW COMPARISON:  09/19/2018 FINDINGS: Stable cardiomediastinal contours. Extensive, diffuse reticulonodular opacities throughout both lungs, markedly progressed from prior. No pleural effusion. No pneumothorax. Osseous structures intact. IMPRESSION: Extensive reticulonodular opacities throughout both lungs suggestive of an atypical infection in an immunocompromised patient. Electronically Signed   By: Davina Poke M.D.   On: 10/01/2018 18:38   Dg Swallowing Func-speech Pathology  Result Date: 10/18/2018 Objective Swallowing Evaluation: Type of Study: MBS-Modified Barium Swallow Study  Patient Details Name: Krystal Short MRN: [id number] Date of Birth: 21-Nov-1985 Today's Date: 10/18/2018 Time: SLP Start Time (ACUTE ONLY): 1000  -SLP Stop Time (ACUTE ONLY): 4742 SLP Time Calculation (min) (ACUTE ONLY): 15 min Past Medical History: Past Medical History: Diagnosis Date  Acute hyponatremia 06/03/2017  Anemia   CAP (community acquired pneumonia) 06/03/2017  Facial dermatitis 06/03/2017  HIV (human immunodeficiency virus infection) ([place])   diagnosed in April 2019  Pleurisy 06/03/2017  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 ([place])   Thrush of mouth and esophagus ([place])   UTI (urinary tract infection)  Past Surgical History: Past Surgical History: Procedure Laterality Date  BIOPSY  07/12/2018  Procedure: BIOPSY;  Surgeon: Laurin Coder, MD;  Location: [facility];  Service: Endoscopy;;  BRONCHIAL WASHINGS  07/12/2018  Procedure: BRONCHIAL WASHINGS;  Surgeon: Laurin Coder, MD;  Location: [facility];  Service: Endoscopy;;  ENDOBRONCHIAL ULTRASOUND N/A 07/12/2018  Procedure: ENDOBRONCHIAL ULTRASOUND;  Surgeon: Laurin Coder, MD;  Location: [facility];  Service: Endoscopy;  Laterality: N/A;  FINE NEEDLE ASPIRATION BIOPSY  07/12/2018  Procedure: FINE NEEDLE ASPIRATION BIOPSY;  Surgeon: Laurin Coder, MD;  Location: [facility];  Service: Endoscopy;;  NO PAST SURGERIES    VIDEO BRONCHOSCOPY N/A 07/12/2018  Procedure: VIDEO BRONCHOSCOPY WITHOUT FLUORO;  Surgeon: Laurin Coder, MD;  Location: [facility];  Service: Endoscopy;  Laterality: N/A; HPI: 33 yo F with history of HIV/AIDS admitted on 8/29 with progressive shortness of breath. History of multiple admissions over the last year for waxing / waning pulmonary infiltrates. Patient decompensated on 8/31 requiring initiation of mechanical intubation and later pressure support and CRRT, was extubated 10/14/2018.  Swallow eval ordered.  Pt denies h/o dysphagia.  .Pt was taken off CRRT yesterday.  Today is tachycardic.  She reports she is receiving ice chips and her spouse gave her "lots of them" yesterday.  Pt had a small bore feeding tube for nutrition,  however, this was found out thursday 10/17/2018 night. Pt to have MBS today.  Subjective: Pt seen in radiology for MBS. RN in attendance. Assessment / Plan / Recommendation CHL IP CLINICAL IMPRESSIONS 10/18/2018 Clinical Impression Pt presents with essentially normal oropharyngeal swallow function. One episode of very trace flash penetration was noted with large straw sips, however, penetrate cleared spontaneously, and no other episodes of penetration were noted. Otherwise, normal swallow without oral residue, post-swallow pharyngeal residue, or aspiration noted. Recommend beginning regular texture solids and thin liquids, meds whole with liquid. Safe swallow precautions posted at [facility] and reviewed with pt and her mother. SLP will follow for diet tolerance and education.  SLP Visit Diagnosis Dysphagia, oropharyngeal phase (R13.12)     Impact on safety and function Mild aspiration risk   CHL IP TREATMENT RECOMMENDATION 10/18/2018 Treatment Recommendations Therapy as outlined in treatment plan below   Prognosis 10/15/2018 Prognosis for Safe Diet Advancement Good     CHL IP DIET RECOMMENDATION 10/18/2018 SLP Diet Recommendations Regular solids Liquid Administration via Straw;Cup Medication Administration Whole meds with liquid Compensations Slow rate;Small sips/bites;Minimize environmental distractions Postural Changes Seated upright at 90 degrees   CHL IP OTHER RECOMMENDATIONS 10/18/2018 Recommended Consults -- Oral Care Recommendations Oral care QID Other Recommendations --   CHL IP FOLLOW UP RECOMMENDATIONS 10/18/2018 Follow up Recommendations None   CHL IP FREQUENCY AND DURATION 10/18/2018 Speech Therapy Frequency (ACUTE ONLY) min 1 x/week Treatment Duration 1 week;2 weeks      CHL IP ORAL PHASE 10/18/2018 Oral Phase WFL  CHL IP PHARYNGEAL PHASE 10/18/2018 Pharyngeal Phase Impaired Pharyngeal- Nectar Cup WFL Pharyngeal- Thin Cup Reduced airway/laryngeal closure;Penetration/Aspiration during swallow Pharyngeal Material enters  airway, remains ABOVE vocal cords then ejected out;Material does not enter airway Pharyngeal- Puree WFL Pharyngeal- Regular WFL  CHL IP CERVICAL ESOPHAGEAL PHASE 10/18/2018 Cervical Esophageal Phase [facility] Celia B. Quentin Ore [facility], CCC-SLP Speech Language Pathologist [phone] [name] 10/18/2018, 10:40 AM              Vas [place] Lower Extremity Venous (dvt)  Result Date: 10/07/2018  Lower Venous Study Indications: SOB, and Edema.  Risk Factors: Advanced HIV/AIDS. Limitations: Lights on in room, patient newly vented. Comparison Study: Prior study from 06/20/17 is available for comparison Performing Technologist: Sharion Dove RVS  Examination Guidelines: A complete evaluation includes B-mode imaging, spectral Doppler, color Doppler, and power Doppler as needed of all accessible portions of each vessel. Bilateral testing is considered an integral part of a complete examination. Limited examinations for reoccurring indications may be performed as noted.  +---------+---------------+---------+-----------+----------+--------------+  RIGHT     Compressibility Phasicity Spontaneity Properties Thrombus Aging  +---------+---------------+---------+-----------+----------+--------------+  CFV       Full  Yes       Yes                                    +---------+---------------+---------+-----------+----------+--------------+  SFJ       Full                                                             +---------+---------------+---------+-----------+----------+--------------+  FV Prox   Full                                                             +---------+---------------+---------+-----------+----------+--------------+  FV Mid    Full                                                             +---------+---------------+---------+-----------+----------+--------------+  FV Distal Full                                                              +---------+---------------+---------+-----------+----------+--------------+  PFV       Full                                                             +---------+---------------+---------+-----------+----------+--------------+  POP       Full            Yes       Yes                                    +---------+---------------+---------+-----------+----------+--------------+  PTV       Full                                                             +---------+---------------+---------+-----------+----------+--------------+  PERO      Full                                                             +---------+---------------+---------+-----------+----------+--------------+   +---------+---------------+---------+-----------+----------+--------------+  LEFT      Compressibility Phasicity  Spontaneity Properties Thrombus Aging  +---------+---------------+---------+-----------+----------+--------------+  CFV       Full            Yes       Yes                                    +---------+---------------+---------+-----------+----------+--------------+  SFJ       Full                                                             +---------+---------------+---------+-----------+----------+--------------+  FV Prox   Full                                                             +---------+---------------+---------+-----------+----------+--------------+  FV Mid    Full                                                             +---------+---------------+---------+-----------+----------+--------------+  FV Distal Full                                                             +---------+---------------+---------+-----------+----------+--------------+  PFV       Full                                                             +---------+---------------+---------+-----------+----------+--------------+  POP       Full            Yes       Yes                                     +---------+---------------+---------+-----------+----------+--------------+  PTV       Full                                                             +---------+---------------+---------+-----------+----------+--------------+  PERO  Not visualized  +---------+---------------+---------+-----------+----------+--------------+     Summary: Right: Findings appear essentially unchanged compared to previous examination. There is no evidence of deep vein thrombosis in the lower extremity. Left: Findings appear essentially unchanged compared to previous examination. There is no evidence of deep vein thrombosis in the lower extremity. However, portions of this examination were limited- see technologist comments above.  *See table(s) above for measurements and observations. Electronically signed by Monica Martinez MD on 10/07/2018 at 6:03:03 PM.    Final    Vas [place] Upper Extremity Venous Duplex  Result Date: 10/15/2018 UPPER VENOUS STUDY  Indications: Edema Limitations: Bandages and Edema and size of the veins. Comparison Study: No previous study available Performing Technologist: Toma Copier RVS  Examination Guidelines: A complete evaluation includes B-mode imaging, spectral Doppler, color Doppler, and power Doppler as needed of all accessible portions of each vessel. Bilateral testing is considered an integral part of a complete examination. Limited examinations for reoccurring indications may be performed as noted.  Right Findings: +----------+------------+---------+-----------+----------+---------------------+  RIGHT      Compressible Phasicity Spontaneous Properties        Summary         +----------+------------+---------+-----------+----------+---------------------+  IJV                                                      Not visualized due to                                                               dialysis access      +----------+------------+---------+-----------+----------+---------------------+  Subclavian     Full        Yes        Yes                                       +----------+------------+---------+-----------+----------+---------------------+  Axillary       Full        Yes        Yes                                       +----------+------------+---------+-----------+----------+---------------------+  Brachial       Full        Yes        Yes                                       +----------+------------+---------+-----------+----------+---------------------+  Radial         Full                                                             +----------+------------+---------+-----------+----------+---------------------+  Ulnar  Full                                                             +----------+------------+---------+-----------+----------+---------------------+  Cephalic                                                 Not visualized due to                                                            edema and size of the                                                                    vein           +----------+------------+---------+-----------+----------+---------------------+  Basilic                                                  Not visualized due to                                                            the size of the vein   +----------+------------+---------+-----------+----------+---------------------+ Technically limited due to dialysis access in the neck, size and edema.  Left Findings: +----------+------------+---------+-----------+----------+---------------------+  LEFT       Compressible Phasicity Spontaneous Properties        Summary         +----------+------------+---------+-----------+----------+---------------------+  IJV                                                      Not visualized due to                                                                IV placement        +----------+------------+---------+-----------+----------+---------------------+  Subclavian  Not visualized due to                                                               bandages for IV                                                                     placement        +----------+------------+---------+-----------+----------+---------------------+  Axillary       Full        Yes        Yes                                       +----------+------------+---------+-----------+----------+---------------------+  Brachial       Full        Yes        Yes                                       +----------+------------+---------+-----------+----------+---------------------+  Radial         Full                                                             +----------+------------+---------+-----------+----------+---------------------+  Ulnar          Full                                                             +----------+------------+---------+-----------+----------+---------------------+  Cephalic                                                 Not visualize due to                                                                size and edema      +----------+------------+---------+-----------+----------+---------------------+  Basilic                                                  Not visualized due to  size and edema      +----------+------------+---------+-----------+----------+---------------------+ Technically limited due to bandages, IV placement, size. and edema  Summary:  Right: No evidence of deep vein thrombosis in the upper extremity. However, unable to visualize all veins. See comments listed above. No evidence of superficial vein thrombosis . However, unable to visualize See comments listed above. No evidence of thrombosis in the subclavian. This was a limited study.  Left: No evidence of  deep vein thrombosis in the upper extremity. However, unable to visualize all veins. See comments listed avone. No evidence of superficial vein thrombosis . However, unable to visualize all veins. See comments listed avone. Unable to visualize the subclavian/ See comments listed abone. This was a limited study.  *See table(s) above for measurements and observations.  Diagnosing physician: Servando Snare MD Electronically signed by Servando Snare MD on 10/15/2018 at 1:15:33 PM.    Final    [place] Ekg Site Rite  Result Date: 10/18/2018 If Site Rite image not attached, placement could not be confirmed due to current cardiac rhythm.  09/11/2018 Bone Marrow Biopsy      09/11/2018 [facility]   ASSESSMENT AND PLAN:  This is a 33 year old unfortunate African-American female with a very complex medical history and course with HIV AIDS CD4 count less than 50 on ART with recurrent admissions for sepsis and possible PJP with  1. Anemia-transfusion dependent 2. Thrombocytopenia-significant thrombocytopenia currently transfusion dependent 3.  Leukocytosis-with elevated procalcitonin levels and significantly elevated interleukin-6 level suggesting severe inflammation. 4.  PJP recently, recurrent sepsis, etiology of pulmonary infiltrates unclear 5.  HIV/AIDS on Biktarvy. On Atovaquone for PCP prophylaxis and Azithromycin for MAI prophylaxis. 6. Splenomegaly and some lymphadenopathy noted on previous imaging  7.  Acute respiratory failure currently intubated and on a mechanical ventilator. 8. Concern for secondary [place]  Patient with pancytopenia with extensive workup including 2 BM Bx's . Patients blood counts havent had a chance for sustained improvement on account of recurrent sepsis, medication causing BM suppression and cytopenia, HIV/AIDS related dysplastic changes on last BM Bx. She has had improvement in her condition with previous antibiotic treatments and brief use of steroids and PJP treatment. Bone  marrow examination has not shown overt infectious etiology, was negative for MAI or other fungal processes. No overt evidence of lymphoma in the bone marrow biopsy. Lymphadenopathy and splenomegaly could be from her HIV AIDS as well as recurrent infections. Currently cultures remain negative and patient is on empiric treatment for healthcare acquired pneumonia.  Significantly elevated interleukin-6 levels suggest severe inflammatory state.  This could be from recurrent infections or macrophage activation syndrome related to HIV. Also has elevated ferritin level in the setting of significant transfusion history and from acute inflammation. Triglyceride levels have fluctuated the last ones are elevated. LDH levels are elevated but this could be in the setting of alternative pulmonary process. She has had very poor nutritional status on account of recurrent admissions sepsis AIDS and other considerations.  This can also play into her bone marrow suppression.  She has had increasing blood CMV titers which infectious disease is aware of but do not feel this is the primary driver of her lung findings or bone marrow suppression.  They have also sent out parvovirus testing to evaluate for this is a sign of bone marrow suppression.  Patient did not respond to IVIG previously to suggest immune cytopenias.  Plan -Patient appears to have clinical improvement - CRRT on hold per nephrology -- monitoring renal function. -Her platelets have improved some  to 30k-->45k -Continue on weekly Nplate at 5 mcg/kg to help boost her platelets to reduce platelet transfusion needs and to potentially provide some safety margin for the possible use of Etoposide. -Continue dexamethasone at 16 mg daily for 7 days and then will continue to taper -continue to recommend transfer to tertiary care facility for mx of secondary [place] and other complex cares. -If patient/her family refuses transfer or transfer not possible/accepted by  outside facility will need to continue treating for secondary [place]. -CT chest on 9/9- sill with significant infiltrates -- still unclear etiology. There was consider of previous lung biopsy with improving platelets would recommend hospital co-ordinate pulmonary input on this ? Need for lung biopsy. Also would need need rpt CMV titers to monitor this in the setting of high dose steroids. Would need ID input on this prior to start Etoposide and will need continued ID monitoring of CMV titer in the setting of steroids and chemotherapy to determine need for presumptive treatment of CMV since this could flare and cause symptomatic disease with high dose steroids and Etoposide and subsequent cyclosporine as a part of secondayr [place] treatment.  -We shall continue to follow on monday -Appreciate assistance from [facility] and hospitalist services.  Sullivan Lone MD MS  . The total time spent in the appointment was 35 minutes and more than 50% was on counseling and direct patient cares.

## 2018-10-20 LAB — RENAL FUNCTION PANEL
Albumin: 2.4 g/dL — ABNORMAL LOW (ref 3.5–5.0)
Anion gap: 12 (ref 5–15)
BUN: 39 mg/dL — ABNORMAL HIGH (ref 6–20)
CO2: 20 mmol/L — ABNORMAL LOW (ref 22–32)
Calcium: 8 mg/dL — ABNORMAL LOW (ref 8.9–10.3)
Chloride: 110 mmol/L (ref 98–111)
Creatinine, Ser: 0.94 mg/dL (ref 0.44–1.00)
GFR calc Af Amer: >60 mL/min (ref >60)
GFR calc non Af Amer: >60 mL/min (ref >60)
Glucose, Bld: 164 mg/dL — ABNORMAL HIGH (ref 70–99)
Phosphorus: 2.8 mg/dL (ref 2.5–4.6)
Potassium: 3.9 mmol/L (ref 3.5–5.1)
Sodium: 142 mmol/L (ref 135–145)

## 2018-10-20 LAB — GLUCOSE, CAPILLARY
Glucose-Capillary: 129 mg/dL — ABNORMAL HIGH (ref 70–99)
Glucose-Capillary: 163 mg/dL — ABNORMAL HIGH (ref 70–99)
Glucose-Capillary: 50 mg/dL — ABNORMAL LOW (ref 70–99)
Glucose-Capillary: 85 mg/dL (ref 70–99)

## 2018-10-20 LAB — APTT: aPTT: 29 seconds (ref 24–36)

## 2018-10-20 LAB — MAGNESIUM: Magnesium: 1.2 mg/dL — ABNORMAL LOW (ref 1.7–2.4)

## 2018-10-20 MED ORDER — MAGNESIUM SULFATE 50 % IJ SOLN: 4.0000 g | Freq: Once | INTRAVENOUS | Status: DC

## 2018-10-20 MED ORDER — MAGNESIUM SULFATE 4 GM/100ML IV SOLN
4.0000 g | Freq: Once | INTRAVENOUS | Status: AC
  Administered 2018-10-20: 12:00:00 4 g via INTRAVENOUS
  Filled 2018-10-20: qty 100

## 2018-10-20 MED ORDER — MAGNESIUM OXIDE 400 (241.3 MG) MG PO TABS
400.0000 mg | ORAL_TABLET | Freq: Two times a day (BID) | ORAL | Status: DC
  Administered 2018-10-20 – 2018-10-23 (×7): 400 mg via ORAL
  Filled 2018-10-20 (×7): qty 1

## 2018-10-20 MED ORDER — POTASSIUM CHLORIDE CRYS ER 20 MEQ PO TBCR: 40.0000 meq | EXTENDED_RELEASE_TABLET | Freq: Once | ORAL | Status: DC

## 2018-10-20 NOTE — Progress Notes (Signed)
[facility]                                                                              Patient Demographics  Krystal Short, is a 33 y.o. female, DOB - 1986/01/07, EXN:[id number]  Admit date - 10/05/2018   Admitting Physician Neena Rhymes, MD  Outpatient Primary MD for the patient is Lajean Manes, MD  Outpatient specialists:   LOS - 14  days   Medical records reviewed and are as summarized below:    Chief Complaint  Patient presents with   Shortness of Breath       Brief summary   Patient is an unfortunate 33 year old female history of HIV/AIDS, chronic anemia/thrombocytopenia requiring multiple transfusions in the past, who was admitted on 10/05/2018 with progressive shortness of breath and acute recurrent hypoxemic respiratory failure. Patient noted have been treated for PGP pneumonia, bacterial pneumonia and with steroids in the past. Patient noted to have some bone marrow suppression as well as platelets of just 9 which trended down from 16 early on in the month. Patient underwent a bone marrow biopsy, BAL. Patient decompensated during the hospitalization and on 10/07/2018 required initiation of mechanical ventilation with pressure support requiring pressors and CRRT. Patient noted to be having metabolic acidosis, acute kidney injury and subsequently anuric. Nephrology consulted, ID consulted and are following. Patient on PCCM service. Patient extubated 10/14/2018 and patient transferred to hospitalist service. PCCM following along.  [facility] assumed care on 9/9  Significant hospital events 8/29 patient was admitted with progressive shortness of breath and respiratory failure 8/30 PCCM was consulted,started solumedrol  80 mg q 12h 8/31 Intubated, FOB 9/1 started on CRRT 9/4 High dose decadron 20 mg IV qd started by heme due to concern of secondary [place]. Send soluble CD 25/ IL2Ra levels - returned on 9/9 as high > 6000 9/5 Weaning pressors, starting  PSV weans, improving CXR 9/7 - Awake on the vent.  No acute events. Weaning on pressure support, extubated on 9/7 9/8 -  Needed bipap respiratory distress. Now on [place] and doing well. Denies vaping . Admits to using DOWN PILLOW (feather) At home x 7 months - new. However, denies  any organic antigen (mold, birds, mildew, humidifer)  9/9  - down to 2L [place]. Per RN =SPB runing soft and MAP barely 65.  Anuric on negative balance with CRRT. Also, each night cRRT filter clots she gets more hypoxemic.  Assessment & Plan   Acute respiratory failure, recurrent with hypoxemia, recurrent bilateral pulmonary infiltrates -Unclear etiology, felt secondary to acute lung injury, possibly hypersensitivity pneumonitis.  -Extubated on 9/7, BAL done, positive for staph epidermidis, PGP negative -Blood cultures negative so far, patient received Bactrim, IV cefepime 8/29> 9/5 -Per ID recommendations, now on oral Zithromax -O2 sats stable 93% on room air has not required BiPAP in last 48 hours  -Pulmonology ID following   Acute kidney injury, metabolic acidosis, hyperkalemia, hypocalcemia -Felt secondary to ATN versus cortical necrosis from septic shock, was anuric -Nephrology was consulted, was placed on CRRT on 9/1, now off - recommended continue strict I's and O's, daily labs, packed RBC transfusion to improve renal  perfusion as well -Creatinine improved to 0.9, nephrology signed off  Hypomagnesemia Replaced IV this morning, also placed on mag oxide 400 mg twice daily  Hypophosphatemia Secondary to CRRT losses, GI losses, replaced via IV  Persistent profound thrombocytopenia, anemia -Patient noted to have had extensive work-up by heme-onc with bone marrow biopsy with normal bone marrow.   -HIT antibody negative, MAHA negative -Patient was initially started on steroids for possible sarcoidosis but no granulomas noted on prior biopsies, tapered off -Status post 6 units platelets, 3 units packed RBC  transfusion. -Hematology, Dr. Irene Limbo following, patient was placed on Decadron 20 mg daily, ended on 9/7 -Obtain CBC  HIV/AIDS On Biktarvy, continue atovaquone for PCP prophylaxis, Zithromax for MAI prophylaxis -Per ID  Acute metabolic encephalopathy In the setting of advanced HIV/AIDS, respiratory failure, acute kidney injury, profound anemia, thrombocytopenia -CT head negative, improving  Moderate protein calorie malnutrition - on regular diet -SLP evaluation done on 9/11, feeding tube was out on 9/10   Transaminitis -Improving  Severe generalized debility: -PT OT evaluation when medically stable Increase mobility, out of bed to chair   Code Status: Full code DVT Prophylaxis: Prevalon boots, SCDs  family Communication: Discussed all imaging results, lab results, explained to the patient   Disposition Plan: Remains inpatient, high risk for deterioration, continue stepdown today  Time Spent in minutes   35 minutes  Procedures:  Intubation, extubation CRRT  Consultants:   PCCM Infectious disease Heme oncology Nephrology  Antimicrobials:   Anti-infectives (From admission, onward)   Start     Dose/Rate Route Frequency Ordered Stop   10/19/18 1000  bictegravir-emtricitabine-tenofovir AF (BIKTARVY) 50-200-25 MG per tablet 1 tablet     1 tablet Oral Daily 10/18/18 1538     10/09/18 1000  emtricitabine-tenofovir AF (DESCOVY) 200-25 MG per tablet 1 tablet  Status:  Discontinued     1 tablet Per Tube Daily 10/08/18 1448 10/18/18 1538   10/09/18 1000  dolutegravir (TIVICAY) tablet 50 mg  Status:  Discontinued     50 mg Oral Daily 10/08/18 1448 10/18/18 1538   10/09/18 0600  ceFEPIme (MAXIPIME) 2 g in sodium chloride 0.9 % 100 mL IVPB  Status:  Discontinued     2 g 200 mL/hr over 30 Minutes Intravenous Every 24 hours 10/08/18 0740 10/08/18 1450   10/08/18 1800  ceFEPIme (MAXIPIME) 2 g in sodium chloride 0.9 % 100 mL IVPB     2 g 200 mL/hr over 30 Minutes Intravenous Every  12 hours 10/08/18 1450 10/12/18 1746   10/08/18 1000  azithromycin (ZITHROMAX) tablet 1,200 mg     1,200 mg Oral Every Tue 10/06/18 0520     10/06/18 1800  ceFEPIme (MAXIPIME) 2 g in sodium chloride 0.9 % 100 mL IVPB  Status:  Discontinued     2 g 200 mL/hr over 30 Minutes Intravenous Every 12 hours 10/06/18 0632 10/08/18 0740   10/06/18 1000  atovaquone (MEPRON) 750 MG/5ML suspension 1,500 mg     1,500 mg Oral Daily 10/06/18 0520     10/06/18 1000  bictegravir-emtricitabine-tenofovir AF (BIKTARVY) 50-200-25 MG per tablet 1 tablet  Status:  Discontinued     1 tablet Oral Daily 10/06/18 0520 10/08/18 1448   10/06/18 0045  ceFEPIme (MAXIPIME) 2 g in sodium chloride 0.9 % 100 mL IVPB  Status:  Discontinued     2 g 200 mL/hr over 30 Minutes Intravenous Every 8 hours 10/06/18 0032 10/06/18 0632   10/06/18 0030  sulfamethoxazole-trimethoprim (BACTRIM) 260 mg of trimethoprim in  dextrose 5 % 250 mL IVPB  Status:  Discontinued     260 mg of trimethoprim 266.3 mL/hr over 60 Minutes Intravenous Every 8 hours 10/06/18 0029 10/06/18 0801         Medications  Scheduled Meds:  atovaquone  1,500 mg Oral Daily   azithromycin  1,200 mg Oral Q Tue   benzonatate  100 mg Oral TID   bictegravir-emtricitabine-tenofovir AF  1 tablet Oral Daily   chlorhexidine  15 mL Mouth Rinse BID   Chlorhexidine Gluconate Cloth  6 each Topical Daily   dexamethasone  16 mg Intravenous Q24H   feeding supplement (PRO-STAT SUGAR FREE 64)  30 mL Per Tube QID   guaiFENesin-dextromethorphan  7.5 mL Per Tube Q6H   lidocaine  1 patch Transdermal Q24H   mouth rinse  15 mL Mouth Rinse q12n4p   mometasone-formoterol  2 puff Inhalation BID   pantoprazole sodium  40 mg Per Tube Daily   romiPLOStim  5 mcg/kg Subcutaneous Weekly   sodium chloride flush  10-40 mL Intracatheter Q12H   Continuous Infusions:  sodium chloride 10 mL/hr at 10/11/18 1737   sodium chloride Stopped (10/18/18 0000)   anticoagulant  sodium citrate     feeding supplement (VITAL 1.5 CAL) Stopped (10/18/18 0430)   magnesium sulfate bolus IVPB 4 g (10/20/18 1205)   PRN Meds:.sodium chloride, Place/Maintain arterial line **AND** sodium chloride, acetaminophen (TYLENOL) oral liquid 160 mg/5 mL, albuterol, alteplase, anticoagulant sodium citrate, fentaNYL (SUBLIMAZE) injection, midazolam, phenol, sodium chloride flush      Subjective:   Krystal Short was seen and examined today.  Very deconditioned, weak otherwise no acute issues overnight.  Alert and oriented.  Patient denies dizziness, abdominal pain, N/V/D/C, tingling.  No fevers..  Objective:   Vitals:   10/20/18 0900 10/20/18 1147 10/20/18 1200 10/20/18 1207  BP: 126/86  116/73   Pulse:      Resp: (!) 27  (!) 26 (!) 27  Temp:  97.9 F (36.6 C)    TempSrc:  Oral    SpO2:   93% 92%  Weight:      Height:        Intake/Output Summary (Last 24 hours) at 10/20/2018 1318 Last data filed at 10/19/2018 1700 Gross per 24 hour  Intake 400 ml  Output --  Net 400 ml     Wt Readings from Last 3 Encounters:  10/20/18 51.1 kg  10/04/18 49.3 kg  09/24/18 50.3 kg    Physical Exam  General: Alert and oriented x 3, NAD, sick appearing  Eyes:   HEENT:  Atraumatic, normocephalic  Cardiovascular: S1 S2 clear, RRR. No pedal edema b/l  Respiratory: CTAB, no wheezing, rales or rhonchi  Gastrointestinal: Soft, nontender, nondistended, NBS  Ext: no pedal edema bilaterally  Neuro: no new deficits  Musculoskeletal: No cyanosis, clubbing  Skin: No rashes  Psych: Normal affect and demeanor, alert and oriented x3      Data Reviewed:  I have personally reviewed following labs and imaging studies  Micro Results No results found for this or any previous visit (from the past 240 hour(s)).  Radiology Reports Dg Chest 2 View  Result Date: 10/05/2018 CLINICAL DATA:  Suspected sepsis EXAM: CHEST - 2 VIEW COMPARISON:  10/02/2018, 10/01/2018, 09/19/2018,  05/28/2018, 06/03/2017, 03/11/2013, 10/15/2017, 07/12/2018 FINDINGS: Small right-sided pleural effusion. Underlying reticular interstitial changes suggesting component of chronic disease, however there is interim worsening of bilateral right greater than left airspace disease, suspicious for acute edema or pneumonia superimposed on chronic underlying  disease. Stable cardiomediastinal silhouette. No pneumothorax. IMPRESSION: 1. Interval worsening of bilateral right greater than left airspace disease, suspicious for acute edema or pneumonia superimposed on underlying chronic disease. Small right-sided pleural effusion. Electronically Signed   By: Donavan Foil M.D.   On: 10/05/2018 23:52   Dg Abd 1 View  Result Date: 10/15/2018 CLINICAL DATA:  Check feeding tube placement EXAM: ABDOMEN - 1 VIEW COMPARISON:  10/07/2018 FINDINGS: Nasogastric catheter has been removed. A weighted feeding catheter is now seen within the mid stomach. Scattered large and small bowel gas is noted. No bony abnormality is seen. IMPRESSION: Feeding catheter within the mid stomach. Electronically Signed   By: Inez Catalina M.D.   On: 10/15/2018 14:05   Dg Abd 1 View  Result Date: 10/07/2018 CLINICAL DATA:  Abdominal distention. OG tube placement. EXAM: ABDOMEN - 1 VIEW COMPARISON:  CT scan of the abdomen dated 08/02/2018 FINDINGS: There is gaseous distention of the stomach. The tip of the OG tube lies in the distal stomach. No dilated large or small bowel. Hepatosplenomegaly. Infiltrates at the lung bases. Bones are normal. IMPRESSION: OG tube tip lies in the distal stomach. Gaseous distention of the stomach. Electronically Signed   By: Lorriane Shire M.D.   On: 10/07/2018 09:58   Ct Head Wo Contrast  Result Date: 10/07/2018 CLINICAL DATA:  Altered mental status, intubation, HIV EXAM: CT HEAD WITHOUT CONTRAST TECHNIQUE: Contiguous axial images were obtained from the base of the skull through the vertex without intravenous contrast.  COMPARISON:  None. FINDINGS: Brain: No evidence of acute infarction, hemorrhage, hydrocephalus, extra-axial collection or mass lesion/mass effect. Vascular: No hyperdense vessel or unexpected calcification. Skull: Normal. Negative for fracture or focal lesion. Sinuses/Orbits: No acute finding. Other: None. IMPRESSION: No acute intracranial pathology. Electronically Signed   By: Eddie Candle M.D.   On: 10/07/2018 16:28   Ct Chest Wo Contrast  Result Date: 10/16/2018 CLINICAL DATA:  Acute respiratory failure EXAM: CT CHEST WITHOUT CONTRAST TECHNIQUE: Multidetector CT imaging of the chest was performed following the standard protocol without IV contrast. COMPARISON:  None. FINDINGS: Cardiovascular: Heart is normal size.  Aorta is normal caliber. Mediastinum/Nodes: Enlarged mediastinal lymph nodes. Index right paratracheal node has a short axis diameter of 11 mm. These are stable since prior study. No axillary adenopathy. Difficult to assess the hila without intravenous contrast. Lungs/Pleura: Extensive interstitial thickening, ground-glass opacities. More confluent consolidation seen in the left upper lobe and both lower lobes. Trace bilateral effusions. Upper Abdomen: Imaging into the upper abdomen shows no acute findings. Probable splenomegaly. The spleen is not imaged in its entirety. Musculoskeletal: Chest wall soft tissues are unremarkable. No acute bony abnormality. IMPRESSION: Diffuse ground-glass airspace disease throughout the lungs with interlobular septal thickening. More confluent consolidation in the left upper lobe and both superior segments of the lower lobes. This is similar to prior study and presumably reflects atypical infection there is mediastinal adenopathy which is also stable. Sarcoidosis could have a similar appearance in the appropriate clinical setting. Trace bilateral effusions. Probable splenomegaly. The spleen is not imaged in its entirety but appears enlarged in the visualized upper  abdomen. Recommend clinical correlation. Electronically Signed   By: Rolm Baptise M.D.   On: 10/16/2018 19:05   US Renal  Result Date: 10/09/2018 CLINICAL DATA:  Acute kidney injury. EXAM: RENAL / URINARY TRACT ULTRASOUND COMPLETE COMPARISON:  08/08/2018 FINDINGS: Right Kidney: Renal measurements: 11.2 x 4.4 x 5.8 cm = volume: 149 mL. Echogenicity is diffusely increased. No hydronephrosis. Left Kidney:  Renal measurements: 12.4 x 5.5 x 4.6 cm = volume: 163 mL. Diffusely increased echogenicity without hydronephrosis. Bladder: Decompressed by Foley catheter. Note: Small to moderate bilateral pleural effusions noted. IMPRESSION: 1. Bilateral echogenic kidneys without hydronephrosis. Imaging features compatible with medical renal disease. 2. Bilateral pleural effusions. Electronically Signed   By: Misty Stanley M.D.   On: 10/09/2018 20:03   Dg Chest Port 1 View  Result Date: 10/15/2018 CLINICAL DATA:  Hypoxia. EXAM: PORTABLE CHEST 1 VIEW COMPARISON:  Radiograph of October 14, 2018. FINDINGS: The heart size and mediastinal contours are within normal limits. Endotracheal and nasogastric tubes have been removed. Bilateral internal jugular catheters are unchanged in position. Stable bilateral bibasilar opacities are noted concerning for possible atelectasis or inflammation. The visualized skeletal structures are unremarkable. IMPRESSION: Endotracheal and nasogastric tubes have been removed. Stable bilateral lung opacities as described above. Electronically Signed   By: Marijo Conception M.D.   On: 10/15/2018 07:08   Dg Chest Port 1 View  Result Date: 10/14/2018 CLINICAL DATA:  resp failure EXAM: PORTABLE CHEST 1 VIEW COMPARISON:  10/13/2018 FINDINGS: Endotracheal tube is in place, tip 4.8 centimeters above the carina. RIGHT IJ central line tip overlies the superior vena cava. LEFT IJ central line tip overlies the superior vena cava. Patient is rotated towards the LEFT. Nasogastric tube tip is off the image. The heart  is normal in size. Diffuse ground-glass opacities are identified throughout the lungs bilaterally without significant change. No new consolidation. IMPRESSION: Stable bilateral pulmonary infiltrates. Electronically Signed   By: Nolon Nations M.D.   On: 10/14/2018 09:03   Dg Chest Port 1 View  Result Date: 10/13/2018 CLINICAL DATA:  Acute kidney injury secondary to shock and hypoperfusion. Intubated patient. EXAM: PORTABLE CHEST 1 VIEW COMPARISON:  Single-view of the chest 10/12/2018 and 10/10/2018. FINDINGS: Support tubes and lines are unchanged and project in good position. Diffuse hazy bilateral airspace disease has improved. No pneumothorax or pleural fluid. Heart size is normal. IMPRESSION: No change in support apparatus. Improved bilateral airspace disease. Electronically Signed   By: Inge Rise M.D.   On: 10/13/2018 08:33   Dg Chest Port 1 View  Result Date: 10/12/2018 CLINICAL DATA:  Acute respiratory failure EXAM: PORTABLE CHEST 1 VIEW COMPARISON:  Two days ago FINDINGS: Endotracheal tube tip between the clavicular heads and carina. Bilateral central line in the IJ with tips at the SVC. The orogastric tube reaches the stomach. Diffuse hazy opacification of the lungs but significantly improved aeration from prior. No visible pleural fluid or air leak. IMPRESSION: 1. Unremarkable hardware positioning. 2. Diffuse airspace disease that is improved from 2 days ago. Electronically Signed   By: Monte Fantasia M.D.   On: 10/12/2018 08:16   Dg Chest Port 1 View  Result Date: 10/10/2018 CLINICAL DATA:  ET positioning EXAM: PORTABLE CHEST 1 VIEW COMPARISON:  Radiograph October 08, 2018 FINDINGS: Endotracheal tube is positioned in the mid trachea approximately 4 cm from the carina right IJ catheter terminates at the superior cavoatrial junction. Left IJ dual lumen catheter terminates at the caval-brachiocephalic confluence. Transesophageal tube tip and side port distal to the GE junction. Pacer pad  overlies the left chest. Persistent bilateral airspace opacities demonstrate some interval clearing in the upper lobes. Basilar opacities are similar to prior. Cardiomediastinal silhouette is largely obscured by overlying opacity. No visible pneumothorax or discernible effusion. No acute osseous or soft tissue abnormality. IMPRESSION: 1. Satisfactory positioning of the lines and tubes, as above. 2. Persistent bilateral airspace opacities  with some interval clearing in the upper lobes. Electronically Signed   By: Lovena Le M.D.   On: 10/10/2018 05:27   Dg Chest Port 1 View  Result Date: 10/09/2018 CLINICAL DATA:  ARDS EXAM: PORTABLE CHEST 1 VIEW COMPARISON:  Radiograph 10/08/2018 FINDINGS: Pacer pads overlie the chest. Right IJ catheter tip terminates at the level of the superior cavoatrial junction. A left IJ dual lumen catheter tip terminates near the brachiocephalic-caval confluence. Endotracheal tube is positioned in the mid trachea, 3.8 cm from the carina. A transesophageal tube tip and side port are distal to the GE junction. There are diffuse consolidative opacities throughout both lungs on a background of more hazy interstitial opacity. Suspect small right effusion. No pneumothorax. Cardiac silhouette is largely obscured by the overlying opacities. IMPRESSION: Extensive bilateral airspace opacities, similar in extent to most recent comparison. Stable, satisfactory positioning of lines and tubes, as above Electronically Signed   By: Lovena Le M.D.   On: 10/09/2018 05:40   Dg Chest Port 1 View  Result Date: 10/08/2018 CLINICAL DATA:  CVL placement EXAM: PORTABLE CHEST 1 VIEW COMPARISON:  10/08/2018, 10/07/2018, 10/05/2018 FINDINGS: Endotracheal tube tip is about 4.3 cm superior to the carina. Esophageal tube tip below the diaphragm but non included. Right IJ central venous catheter tip over the SVC. Left IJ central venous catheter tip over the upper SVC. No left pneumothorax. Extensive bilateral  interstitial and alveolar disease. Obscured cardiomediastinal silhouette. Probable small effusions. IMPRESSION: 1. New left IJ central venous catheter with tip over the SVC. No pneumothorax. Support lines and tubes otherwise grossly stable in position 2. No significant change in extensive interstitial and alveolar disease bilaterally. Electronically Signed   By: Donavan Foil M.D.   On: 10/08/2018 14:06   Dg Chest Port 1 View  Result Date: 10/08/2018 CLINICAL DATA:  Check endotracheal tube placement EXAM: PORTABLE CHEST 1 VIEW COMPARISON:  10/07/2018 FINDINGS: Cardiac shadow is stable. Right jugular central line, gastric catheter and endotracheal tube are again seen. The endotracheal tube now lies 0.6 cm above the carina slightly withdrawn when compared with the prior exam. Persistent bilateral airspace opacities are noted worst in the right lung base stable from the previous day. Small right-sided effusion is noted superiorly. Bony abnormality is noted. IMPRESSION: Tubes and lines as described above. Stable airspace opacities bilaterally. Electronically Signed   By: Inez Catalina M.D.   On: 10/08/2018 08:43   Portable Chest X-ray  Result Date: 10/07/2018 CLINICAL DATA:  Intubation EXAM: PORTABLE CHEST 1 VIEW COMPARISON:  October 06, 2018 FINDINGS: ET tube is 3 cm above the level of carina. Enteric tube is seen coursing below the diaphragm. A right-sided PICC is seen at the superior cavoatrial junction. Patchy airspace opacities are seen throughout both lungs, right greater than left. The cardiomediastinal silhouette is unchanged. IMPRESSION: ET tube 3 cm above the carina. Unchanged patchy airspace opacities, right greater than left. Electronically Signed   By: Prudencio Pair M.D.   On: 10/07/2018 10:05   Dg Chest Port 1 View  Result Date: 10/06/2018 CLINICAL DATA:  33 year old female with centralized placement. EXAM: PORTABLE CHEST 1 VIEW COMPARISON:  Chest radiograph dated 10/05/2018 FINDINGS: Right IJ  central venous line with tip over the cavoatrial junction. No pneumothorax. Bilateral airspace opacities similar to prior radiograph. There is a small right pleural effusion. No acute osseous pathology. IMPRESSION: 1. Interval placement of a right IJ central venous line with tip in the region of the cavoatrial junction. No pneumothorax. 2. No significant  interval change in the bilateral airspace opacities. Electronically Signed   By: Anner Crete M.D.   On: 10/06/2018 04:04   Dg Chest Port 1 View  Result Date: 10/02/2018 CLINICAL DATA:  Tachypnea EXAM: PORTABLE CHEST 1 VIEW COMPARISON:  Yesterday FINDINGS: Generalized reticular pulmonary opacity, present on multiple prior studies. No pleural fluid or pneumothorax. Normal heart size. IMPRESSION: Chronic reticular pulmonary opacity without acute superimposed finding. Electronically Signed   By: Monte Fantasia M.D.   On: 10/02/2018 05:45   Dg Chest Port 1 View  Result Date: 10/01/2018 CLINICAL DATA:  Cough, pancytopenia EXAM: PORTABLE CHEST 1 VIEW COMPARISON:  09/19/2018 FINDINGS: Stable cardiomediastinal contours. Extensive, diffuse reticulonodular opacities throughout both lungs, markedly progressed from prior. No pleural effusion. No pneumothorax. Osseous structures intact. IMPRESSION: Extensive reticulonodular opacities throughout both lungs suggestive of an atypical infection in an immunocompromised patient. Electronically Signed   By: Davina Poke M.D.   On: 10/01/2018 18:38   Dg Swallowing Func-speech Pathology  Result Date: 10/18/2018 Objective Swallowing Evaluation: Type of Study: MBS-Modified Barium Swallow Study  Patient Details Name: Rhylee Pucillo MRN: [id number] Date of Birth: 1985-11-11 Today's Date: 10/18/2018 Time: SLP Start Time (ACUTE ONLY): 1000 -SLP Stop Time (ACUTE ONLY): 8295 SLP Time Calculation (min) (ACUTE ONLY): 15 min Past Medical History: Past Medical History: Diagnosis Date  Acute hyponatremia 06/03/2017  Anemia   CAP  (community acquired pneumonia) 06/03/2017  Facial dermatitis 06/03/2017  HIV (human immunodeficiency virus infection) ([place])   diagnosed in April 2019  Pleurisy 06/03/2017  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 ([place])   Thrush of mouth and esophagus ([place])   UTI (urinary tract infection)  Past Surgical History: Past Surgical History: Procedure Laterality Date  BIOPSY  07/12/2018  Procedure: BIOPSY;  Surgeon: Laurin Coder, MD;  Location: [facility];  Service: Endoscopy;;  BRONCHIAL WASHINGS  07/12/2018  Procedure: BRONCHIAL WASHINGS;  Surgeon: Laurin Coder, MD;  Location: [facility];  Service: Endoscopy;;  ENDOBRONCHIAL ULTRASOUND N/A 07/12/2018  Procedure: ENDOBRONCHIAL ULTRASOUND;  Surgeon: Laurin Coder, MD;  Location: [facility];  Service: Endoscopy;  Laterality: N/A;  FINE NEEDLE ASPIRATION BIOPSY  07/12/2018  Procedure: FINE NEEDLE ASPIRATION BIOPSY;  Surgeon: Laurin Coder, MD;  Location: [facility];  Service: Endoscopy;;  NO PAST SURGERIES    VIDEO BRONCHOSCOPY N/A 07/12/2018  Procedure: VIDEO BRONCHOSCOPY WITHOUT FLUORO;  Surgeon: Laurin Coder, MD;  Location: [facility];  Service: Endoscopy;  Laterality: N/A; HPI: 33 yo F with history of HIV/AIDS admitted on 8/29 with progressive shortness of breath. History of multiple admissions over the last year for waxing / waning pulmonary infiltrates. Patient decompensated on 8/31 requiring initiation of mechanical intubation and later pressure support and CRRT, was extubated 10/14/2018.  Swallow eval ordered.  Pt denies h/o dysphagia.  .Pt was taken off CRRT yesterday.  Today is tachycardic.  She reports she is receiving ice chips and her spouse gave her "lots of them" yesterday.  Pt had a small bore feeding tube for nutrition, however, this was found out thursday 10/17/2018 night. Pt to have MBS today.  Subjective: Pt seen in radiology for MBS. RN in attendance. Assessment / Plan / Recommendation CHL IP CLINICAL  IMPRESSIONS 10/18/2018 Clinical Impression Pt presents with essentially normal oropharyngeal swallow function. One episode of very trace flash penetration was noted with large straw sips, however, penetrate cleared spontaneously, and no other episodes of penetration were noted. Otherwise, normal swallow without oral residue, post-swallow pharyngeal residue, or aspiration noted. Recommend beginning regular  texture solids and thin liquids, meds whole with liquid. Safe swallow precautions posted at [facility] and reviewed with pt and her mother. SLP will follow for diet tolerance and education.  SLP Visit Diagnosis Dysphagia, oropharyngeal phase (R13.12)     Impact on safety and function Mild aspiration risk   CHL IP TREATMENT RECOMMENDATION 10/18/2018 Treatment Recommendations Therapy as outlined in treatment plan below   Prognosis 10/15/2018 Prognosis for Safe Diet Advancement Good     CHL IP DIET RECOMMENDATION 10/18/2018 SLP Diet Recommendations Regular solids Liquid Administration via Straw;Cup Medication Administration Whole meds with liquid Compensations Slow rate;Small sips/bites;Minimize environmental distractions Postural Changes Seated upright at 90 degrees   CHL IP OTHER RECOMMENDATIONS 10/18/2018 Recommended Consults -- Oral Care Recommendations Oral care QID Other Recommendations --   CHL IP FOLLOW UP RECOMMENDATIONS 10/18/2018 Follow up Recommendations None   CHL IP FREQUENCY AND DURATION 10/18/2018 Speech Therapy Frequency (ACUTE ONLY) min 1 x/week Treatment Duration 1 week;2 weeks      CHL IP ORAL PHASE 10/18/2018 Oral Phase WFL  CHL IP PHARYNGEAL PHASE 10/18/2018 Pharyngeal Phase Impaired Pharyngeal- Nectar Cup WFL Pharyngeal- Thin Cup Reduced airway/laryngeal closure;Penetration/Aspiration during swallow Pharyngeal Material enters airway, remains ABOVE vocal cords then ejected out;Material does not enter airway Pharyngeal- Puree WFL Pharyngeal- Regular WFL  CHL IP CERVICAL ESOPHAGEAL PHASE 10/18/2018 Cervical  Esophageal Phase [facility] Celia B. Quentin Ore [facility], CCC-SLP Speech Language Pathologist [phone] [name] 10/18/2018, 10:40 AM              Vas [place] Lower Extremity Venous (dvt)  Result Date: 10/07/2018  Lower Venous Study Indications: SOB, and Edema.  Risk Factors: Advanced HIV/AIDS. Limitations: Lights on in room, patient newly vented. Comparison Study: Prior study from 06/20/17 is available for comparison Performing Technologist: Sharion Dove RVS  Examination Guidelines: A complete evaluation includes B-mode imaging, spectral Doppler, color Doppler, and power Doppler as needed of all accessible portions of each vessel. Bilateral testing is considered an integral part of a complete examination. Limited examinations for reoccurring indications may be performed as noted.  +---------+---------------+---------+-----------+----------+--------------+  RIGHT     Compressibility Phasicity Spontaneity Properties Thrombus Aging  +---------+---------------+---------+-----------+----------+--------------+  CFV       Full            Yes       Yes                                    +---------+---------------+---------+-----------+----------+--------------+  SFJ       Full                                                             +---------+---------------+---------+-----------+----------+--------------+  FV Prox   Full                                                             +---------+---------------+---------+-----------+----------+--------------+  FV Mid    Full                                                             +---------+---------------+---------+-----------+----------+--------------+  FV Distal Full                                                             +---------+---------------+---------+-----------+----------+--------------+  PFV       Full                                                             +---------+---------------+---------+-----------+----------+--------------+  POP       Full             Yes       Yes                                    +---------+---------------+---------+-----------+----------+--------------+  PTV       Full                                                             +---------+---------------+---------+-----------+----------+--------------+  PERO      Full                                                             +---------+---------------+---------+-----------+----------+--------------+   +---------+---------------+---------+-----------+----------+--------------+  LEFT      Compressibility Phasicity Spontaneity Properties Thrombus Aging  +---------+---------------+---------+-----------+----------+--------------+  CFV       Full            Yes       Yes                                    +---------+---------------+---------+-----------+----------+--------------+  SFJ       Full                                                             +---------+---------------+---------+-----------+----------+--------------+  FV Prox   Full                                                             +---------+---------------+---------+-----------+----------+--------------+  FV Mid    Full                                                             +---------+---------------+---------+-----------+----------+--------------+  FV Distal Full                                                             +---------+---------------+---------+-----------+----------+--------------+  PFV       Full                                                             +---------+---------------+---------+-----------+----------+--------------+  POP       Full            Yes       Yes                                    +---------+---------------+---------+-----------+----------+--------------+  PTV       Full                                                             +---------+---------------+---------+-----------+----------+--------------+  PERO                                                       Not  visualized  +---------+---------------+---------+-----------+----------+--------------+     Summary: Right: Findings appear essentially unchanged compared to previous examination. There is no evidence of deep vein thrombosis in the lower extremity. Left: Findings appear essentially unchanged compared to previous examination. There is no evidence of deep vein thrombosis in the lower extremity. However, portions of this examination were limited- see technologist comments above.  *See table(s) above for measurements and observations. Electronically signed by Monica Martinez MD on 10/07/2018 at 6:03:03 PM.    Final    Vas [place] Upper Extremity Venous Duplex  Result Date: 10/15/2018 UPPER VENOUS STUDY  Indications: Edema Limitations: Bandages and Edema and size of the veins. Comparison Study: No previous study available Performing Technologist: Toma Copier RVS  Examination Guidelines: A complete evaluation includes B-mode imaging, spectral Doppler, color Doppler, and power Doppler as needed of all accessible portions of each vessel. Bilateral testing is considered an integral part of a complete examination. Limited examinations for reoccurring indications may be performed as noted.  Right Findings: +----------+------------+---------+-----------+----------+---------------------+  RIGHT      Compressible Phasicity Spontaneous Properties        Summary         +----------+------------+---------+-----------+----------+---------------------+  IJV                                                      Not visualized due to  dialysis access     +----------+------------+---------+-----------+----------+---------------------+  Subclavian     Full        Yes        Yes                                       +----------+------------+---------+-----------+----------+---------------------+  Axillary       Full        Yes        Yes                                        +----------+------------+---------+-----------+----------+---------------------+  Brachial       Full        Yes        Yes                                       +----------+------------+---------+-----------+----------+---------------------+  Radial         Full                                                             +----------+------------+---------+-----------+----------+---------------------+  Ulnar          Full                                                             +----------+------------+---------+-----------+----------+---------------------+  Cephalic                                                 Not visualized due to                                                            edema and size of the                                                                    vein           +----------+------------+---------+-----------+----------+---------------------+  Basilic                                                  Not visualized due to  the size of the vein   +----------+------------+---------+-----------+----------+---------------------+ Technically limited due to dialysis access in the neck, size and edema.  Left Findings: +----------+------------+---------+-----------+----------+---------------------+  LEFT       Compressible Phasicity Spontaneous Properties        Summary         +----------+------------+---------+-----------+----------+---------------------+  IJV                                                      Not visualized due to                                                                IV placement       +----------+------------+---------+-----------+----------+---------------------+  Subclavian                                               Not visualized due to                                                               bandages for IV                                                                     placement         +----------+------------+---------+-----------+----------+---------------------+  Axillary       Full        Yes        Yes                                       +----------+------------+---------+-----------+----------+---------------------+  Brachial       Full        Yes        Yes                                       +----------+------------+---------+-----------+----------+---------------------+  Radial         Full                                                             +----------+------------+---------+-----------+----------+---------------------+  Ulnar          Full                                                             +----------+------------+---------+-----------+----------+---------------------+  Cephalic                                                 Not visualize due to                                                                size and edema      +----------+------------+---------+-----------+----------+---------------------+  Basilic                                                  Not visualized due to                                                               size and edema      +----------+------------+---------+-----------+----------+---------------------+ Technically limited due to bandages, IV placement, size. and edema  Summary:  Right: No evidence of deep vein thrombosis in the upper extremity. However, unable to visualize all veins. See comments listed above. No evidence of superficial vein thrombosis . However, unable to visualize See comments listed above. No evidence of thrombosis in the subclavian. This was a limited study.  Left: No evidence of deep vein thrombosis in the upper extremity. However, unable to visualize all veins. See comments listed avone. No evidence of superficial vein thrombosis . However, unable to visualize all veins. See comments listed avone. Unable to visualize the subclavian/ See comments listed abone. This was a limited study.  *See  table(s) above for measurements and observations.  Diagnosing physician: Servando Snare MD Electronically signed by Servando Snare MD on 10/15/2018 at 1:15:33 PM.    Final    [place] Ekg Site Rite  Result Date: 10/18/2018 If Site Rite image not attached, placement could not be confirmed due to current cardiac rhythm.   Lab Data:  CBC: Recent Labs  Lab 10/15/18 0330 10/16/18 0725 10/17/18 0423 10/17/18 1500 10/18/18 0425 10/19/18 0430  WBC 25.3* 16.8* 16.5*  --  13.5* 12.3*  NEUTROABS  --  13.4* 13.5*  --   --   --   HGB 8.1* 7.8* 6.1* 8.1* 8.1* 8.2*  HCT 25.0* 24.6* 19.3* 25.1* 25.2* 25.4*  MCV 93.6 96.5 97.5  --  93.0 95.1  PLT 16* 21* 26*  --  30* 45*   Basic Metabolic Panel: Recent Labs  Lab 10/16/18 0350 10/17/18 0423 10/18/18 0425 10/19/18 0430 10/20/18 0616 10/20/18 0617  NA 134* 138 142 142 142  --   K 4.2 4.4 3.7 3.4* 3.9  --   CL 101 104 106 110 110  --   CO2 _0 20* 20*  --   GLUCOSE 173* 123* 150* 123* 164*  --   BUN 31* 58* 78* 67* 39*  --   CREATININE 1.09* 1.57* 1.52* 1.27* 0.94  --   CALCIUM 7.7*  7.3* 8.3* 8.1* 8.0*  --   MG 2.5* 2.0 1.8 1.2*  --  1.2*  PHOS 2.4* 2.6 3.4 3.2 2.8  --    GFR: Estimated Creatinine Clearance: 65.4 mL/min (by C-G formula based on SCr of 0.94 mg/dL). Liver Function Tests: Recent Labs  Lab 10/16/18 0350 10/17/18 0423 10/18/18 0425 10/19/18 0430 10/20/18 0616  AST 58*  --   --   --   --   ALT 35  --   --   --   --   ALKPHOS 370*  --   --   --   --   BILITOT 2.5*  --   --   --   --   PROT 6.8  --   --   --   --   ALBUMIN 2.4*   2.5* 2.3* 2.3* 2.3* 2.4*   No results for input(s): LIPASE, AMYLASE in the last 168 hours. No results for input(s): AMMONIA in the last 168 hours. Coagulation Profile: No results for input(s): INR, PROTIME in the last 168 hours. Cardiac Enzymes: No results for input(s): CKTOTAL, CKMB, CKMBINDEX, TROPONINI in the last 168 hours. BNP (last 3 results) No results for input(s): PROBNP in the last  8760 hours. HbA1C: No results for input(s): HGBA1C in the last 72 hours. CBG: Recent Labs  Lab 10/19/18 2025 10/19/18 2350 10/20/18 0419 10/20/18 0523 10/20/18 0743  GLUCAP 239* 163* 50* 129* 85   Lipid Profile: No results for input(s): CHOL, HDL, LDLCALC, TRIG, CHOLHDL, LDLDIRECT in the last 72 hours. Thyroid Function Tests: No results for input(s): TSH, T4TOTAL, FREET4, T3FREE, THYROIDAB in the last 72 hours. Anemia Panel: No results for input(s): VITAMINB12, FOLATE, FERRITIN, TIBC, IRON, RETICCTPCT in the last 72 hours. Urine analysis:    Component Value Date/Time   COLORURINE AMBER (A) 10/01/2018 1832   APPEARANCEUR HAZY (A) 10/01/2018 1832   LABSPEC 1.006 10/01/2018 1832   PHURINE 5.0 10/01/2018 1832   GLUCOSEU NEGATIVE 10/01/2018 1832   HGBUR NEGATIVE 10/01/2018 1832   BILIRUBINUR NEGATIVE 10/01/2018 1832   KETONESUR NEGATIVE 10/01/2018 [place] NEGATIVE 10/01/2018 1832   UROBILINOGEN 1.0 12/03/2011 0550   NITRITE NEGATIVE 10/01/2018 1832   LEUKOCYTESUR NEGATIVE 10/01/2018 1832     Aniston Christman M.D. [facility] 10/20/2018, 1:18 PM  Pager: [phone] Between 7am to 7pm - call Pager - [phone]  After 7pm go to [email or web address] - password [email or web address]  Call night coverage person covering after 7pm

## 2018-10-21 LAB — GLUCOSE, CAPILLARY
Glucose-Capillary: 59 mg/dL — ABNORMAL LOW (ref 70–99)
Glucose-Capillary: 79 mg/dL (ref 70–99)

## 2018-10-21 LAB — RENAL FUNCTION PANEL
Albumin: 2.7 g/dL — ABNORMAL LOW (ref 3.5–5.0)
Anion gap: 8 (ref 5–15)
BUN: 29 mg/dL — ABNORMAL HIGH (ref 6–20)
CO2: 23 mmol/L (ref 22–32)
Calcium: 7.8 mg/dL — ABNORMAL LOW (ref 8.9–10.3)
Chloride: 113 mmol/L — ABNORMAL HIGH (ref 98–111)
Creatinine, Ser: 0.72 mg/dL (ref 0.44–1.00)
GFR calc Af Amer: >60 mL/min (ref >60)
GFR calc non Af Amer: >60 mL/min (ref >60)
Glucose, Bld: 90 mg/dL (ref 70–99)
Phosphorus: 4 mg/dL (ref 2.5–4.6)
Potassium: 4 mmol/L (ref 3.5–5.1)
Sodium: 144 mmol/L (ref 135–145)

## 2018-10-21 LAB — BPAM RBC
Blood Product Expiration Date: 202010102359
Blood Product Expiration Date: 202010112359
ISSUE DATE / TIME: 202009100951
Unit Type and Rh: 5100
Unit Type and Rh: 5100

## 2018-10-21 LAB — TYPE AND SCREEN
ABO/RH(D): O POS
Antibody Screen: NEGATIVE
Unit division: 0
Unit division: 0

## 2018-10-21 LAB — APTT: aPTT: 30 seconds (ref 24–36)

## 2018-10-21 LAB — MAGNESIUM: Magnesium: 1.7 mg/dL (ref 1.7–2.4)

## 2018-10-21 MED ORDER — DEXTROSE 50 % IV SOLN: 25.0000 mL | INTRAVENOUS | Status: DC | PRN

## 2018-10-21 MED ORDER — DEXTROSE-NACL 5-0.45 % IV SOLN
INTRAVENOUS | Status: DC
  Administered 2018-10-21: 09:00:00 via INTRAVENOUS

## 2018-10-21 MED ORDER — ENSURE ENLIVE PO LIQD
237.0000 mL | Freq: Two times a day (BID) | ORAL | Status: DC
  Administered 2018-10-21 – 2018-10-23 (×4): 237 mL via ORAL

## 2018-10-21 NOTE — Progress Notes (Signed)
Physical Therapy Treatment Patient Details Name: Krystal Short MRN: [id number] DOB: 1985/11/25 Today's Date: 10/21/2018    History of Present Illness 33 year old female history of HIV/AIDS, chronic anemia/thrombocytopenia requiring multiple transfusions in the past, who was admitted on 10/05/2018 with progressive shortness of breath and acute recurrent hypoxemic respiratory failure. Pt on ventilator 10/07/18-10/14/18. Dx of metabolic acidosis, AKI, acute metabolic encephalopathy, thrombyocytopenia    PT Comments    Progressing with mobility.    Follow Up Recommendations  Supervision for mobility/OOB;Home health PT     Equipment Recommendations  None recommended by PT    Recommendations for Other Services       Precautions / Restrictions Precautions Precautions: Fall Restrictions Weight Bearing Restrictions: No    Mobility  Bed Mobility               General bed mobility comments: oob in recliner  Transfers Overall transfer level: Needs assistance Equipment used: Rolling walker (2 wheeled) Transfers: Sit to/from Stand Sit to Stand: Min guard         General transfer comment: Min guard for safety.  Ambulation/Gait Ambulation/Gait assistance: Min guard Gait Distance (Feet): 150 Feet Assistive device: Rolling walker (2 wheeled) Gait Pattern/deviations: Step-through pattern;Decreased stride length     General Gait Details: Min guard for safety. Slow gait speed. No LOB with RW use   Stairs             Wheelchair Mobility    Modified Rankin (Stroke Patients Only)       Balance Overall balance assessment: Needs assistance         Standing balance support: Bilateral upper extremity supported Standing balance-Leahy Scale: Poor                              Cognition Arousal/Alertness: Awake/alert Behavior During Therapy: WFL for tasks assessed/performed Overall Cognitive Status: Within Functional Limits for tasks assessed                                         Exercises      General Comments        Pertinent Vitals/Pain Pain Assessment: Faces Faces Pain Scale: No hurt    Home Living                      Prior Function            PT Goals (current goals can now be found in the care plan section) Progress towards PT goals: Progressing toward goals    Frequency    Min 3X/week      PT Plan Current plan remains appropriate    Co-evaluation              AM-PAC PT "6 Clicks" Mobility   Outcome Measure  Help needed turning from your back to your side while in a flat bed without using bedrails?: A Little Help needed moving from lying on your back to sitting on the side of a flat bed without using bedrails?: A Little Help needed moving to and from a bed to a chair (including a wheelchair)?: A Little Help needed standing up from a chair using your arms (e.g., wheelchair or bedside chair)?: A Little Help needed to walk in hospital room?: A Little Help needed climbing 3-5 steps with a railing? : A Little 6 Click  Score: 18    End of Session Equipment Utilized During Treatment: Gait belt Activity Tolerance: Patient tolerated treatment well Patient left: in chair;with call bell/phone within reach;with family/visitor present   PT Visit Diagnosis: Unsteadiness on feet (R26.81);Difficulty in walking, not elsewhere classified (R26.2);Muscle weakness (generalized) (M62.81)     Time: [id number] PT Time Calculation (min) (ACUTE ONLY): 14 min  Charges:  $Gait Training: 8-22 mins                        Weston Anna, PT Acute Rehabilitation Services Pager: [phone] Office: [phone]

## 2018-10-21 NOTE — Progress Notes (Signed)
Per MD request, got patient up to chair.  Breakfast is in front of patient and she is eating.  D5 gtt started per order.

## 2018-10-21 NOTE — Progress Notes (Addendum)
[facility]                                                                              Patient Demographics  Krystal Short, is a 33 y.o. female, DOB - Nov 13, 1985, XIH:[id number]  Admit date - 10/05/2018   Admitting Physician Neena Rhymes, MD  Outpatient Primary MD for the patient is Lajean Manes, MD  Outpatient specialists:   LOS - 15  days   Medical records reviewed and are as summarized below:    Chief Complaint  Patient presents with   Shortness of Breath       Brief summary   Patient is an unfortunate 33 year old female history of HIV/AIDS, chronic anemia/thrombocytopenia requiring multiple transfusions in the past, who was admitted on 10/05/2018 with progressive shortness of breath and acute recurrent hypoxemic respiratory failure. Patient noted have been treated for PGP pneumonia, bacterial pneumonia and with steroids in the past. Patient noted to have some bone marrow suppression as well as platelets of just 9 which trended down from 16 early on in the month. Patient underwent a bone marrow biopsy, BAL. Patient decompensated during the hospitalization and on 10/07/2018 required initiation of mechanical ventilation with pressure support requiring pressors and CRRT. Patient noted to be having metabolic acidosis, acute kidney injury and subsequently anuric. Nephrology consulted, ID consulted and are following. Patient on PCCM service. Patient extubated 10/14/2018 and patient transferred to hospitalist service. PCCM following along.  [facility] assumed care on 9/9  Significant hospital events 8/29 patient was admitted with progressive shortness of breath and respiratory failure 8/30 PCCM was consulted,started solumedrol  80 mg q 12h 8/31 Intubated, FOB 9/1 started on CRRT 9/4 High dose decadron 20 mg IV qd started by heme due to concern of secondary [place]. Send soluble CD 25/ IL2Ra levels - returned on 9/9 as high > 6000 9/5 Weaning pressors, starting  PSV weans, improving CXR 9/7 - Awake on the vent.  No acute events. Weaning on pressure support, extubated on 9/7 9/8 -  Needed bipap respiratory distress. Now on [place] and doing well. Denies vaping . Admits to using DOWN PILLOW (feather) At home x 7 months - new. However, denies  any organic antigen (mold, birds, mildew, humidifer)  9/9  - down to 2L [place]. Per RN =SPB runing soft and MAP barely 65.  Anuric on negative balance with CRRT. Also, each night cRRT filter clots she gets more hypoxemic.  Assessment & Plan   Acute respiratory failure, recurrent with hypoxemia, recurrent bilateral pulmonary infiltrates -Unclear etiology, felt secondary to acute lung injury, possibly hypersensitivity pneumonitis.  -Extubated on 9/7, BAL done, positive for staph epidermidis, PGP negative -Blood cultures negative so far, patient received Bactrim, IV cefepime 8/29> 9/5 -Per ID recommendations, now on oral Zithromax -O2 sats stable, 100% on room air   Acute kidney injury, metabolic acidosis, hyperkalemia, hypocalcemia -Felt secondary to ATN versus cortical necrosis from septic shock, was anuric -Nephrology was consulted, was placed on CRRT on 9/1, now off - recommended continue strict I's and O's, daily labs, packed RBC transfusion as needed to improve renal perfusion as well -Creatinine resolved, patient tolerating diet, 0.72.  Nephrology has signed off.  Hypomagnesemia Stable, continue oral replacement  Hypophosphatemia Improved, secondary to CRRT losses, GI losses, replaced via IV  Persistent profound thrombocytopenia, anemia -Patient noted to have had extensive work-up by heme-onc with bone marrow biopsy with normal bone marrow.   -HIT antibody negative, MAHA negative -Patient was initially started on steroids for possible sarcoidosis but no granulomas noted on prior biopsies, tapered off -Status post 6 units platelets, 3 units packed RBC transfusion. -Hematology, Dr. Irene Limbo following, patient was  placed on Decadron 20 mg daily, ended on 9/7 -Follow CBC  HIV/AIDS On Biktarvy, continue atovaquone for PCP prophylaxis, Zithromax for MAI prophylaxis -Per ID  Acute metabolic encephalopathy In the setting of advanced HIV/AIDS, respiratory failure, acute kidney injury, profound anemia, thrombocytopenia -CT head negative, improving  Moderate protein calorie malnutrition - on regular diet -SLP evaluation done on 9/11, feeding tube was out on 9/10 -Hypoglycemia this a.m., will add Ensure twice daily, D5 drip at 50 cc/hr until eating at least 75% meals.   Transaminitis -Improving  Severe generalized debility: -PT OT evaluation, out of bed to chair, increase mobility   Code Status: Full code DVT Prophylaxis: Prevalon boots, SCDs  family Communication: Discussed all imaging results, lab results, explained to the patient. Called patient's husband, unable to make contact, left detailed voicemail message.  Disposition Plan: Much improved in the last 24 hours, will transfer to telemetry unit  Time Spent in minutes   35 minutes  Procedures:  Intubation, extubation CRRT  Consultants:   PCCM Infectious disease Heme oncology Nephrology  Antimicrobials:   Anti-infectives (From admission, onward)   Start     Dose/Rate Route Frequency Ordered Stop   10/19/18 1000  bictegravir-emtricitabine-tenofovir AF (BIKTARVY) 50-200-25 MG per tablet 1 tablet     1 tablet Oral Daily 10/18/18 1538     10/09/18 1000  emtricitabine-tenofovir AF (DESCOVY) 200-25 MG per tablet 1 tablet  Status:  Discontinued     1 tablet Per Tube Daily 10/08/18 1448 10/18/18 1538   10/09/18 1000  dolutegravir (TIVICAY) tablet 50 mg  Status:  Discontinued     50 mg Oral Daily 10/08/18 1448 10/18/18 1538   10/09/18 0600  ceFEPIme (MAXIPIME) 2 g in sodium chloride 0.9 % 100 mL IVPB  Status:  Discontinued     2 g 200 mL/hr over 30 Minutes Intravenous Every 24 hours 10/08/18 0740 10/08/18 1450   10/08/18 1800  ceFEPIme  (MAXIPIME) 2 g in sodium chloride 0.9 % 100 mL IVPB     2 g 200 mL/hr over 30 Minutes Intravenous Every 12 hours 10/08/18 1450 10/12/18 1746   10/08/18 1000  azithromycin (ZITHROMAX) tablet 1,200 mg     1,200 mg Oral Every Tue 10/06/18 0520     10/06/18 1800  ceFEPIme (MAXIPIME) 2 g in sodium chloride 0.9 % 100 mL IVPB  Status:  Discontinued     2 g 200 mL/hr over 30 Minutes Intravenous Every 12 hours 10/06/18 0632 10/08/18 0740   10/06/18 1000  atovaquone (MEPRON) 750 MG/5ML suspension 1,500 mg     1,500 mg Oral Daily 10/06/18 0520     10/06/18 1000  bictegravir-emtricitabine-tenofovir AF (BIKTARVY) 50-200-25 MG per tablet 1 tablet  Status:  Discontinued     1 tablet Oral Daily 10/06/18 0520 10/08/18 1448   10/06/18 0045  ceFEPIme (MAXIPIME) 2 g in sodium chloride 0.9 % 100 mL IVPB  Status:  Discontinued     2 g 200 mL/hr over 30 Minutes Intravenous Every 8 hours 10/06/18  2951 10/06/18 8841   10/06/18 0030  sulfamethoxazole-trimethoprim (BACTRIM) 260 mg of trimethoprim in dextrose 5 % 250 mL IVPB  Status:  Discontinued     260 mg of trimethoprim 266.3 mL/hr over 60 Minutes Intravenous Every 8 hours 10/06/18 0029 10/06/18 0801         Medications  Scheduled Meds:  atovaquone  1,500 mg Oral Daily   azithromycin  1,200 mg Oral Q Tue   benzonatate  100 mg Oral TID   bictegravir-emtricitabine-tenofovir AF  1 tablet Oral Daily   chlorhexidine  15 mL Mouth Rinse BID   Chlorhexidine Gluconate Cloth  6 each Topical Daily   dexamethasone  16 mg Intravenous Q24H   feeding supplement (PRO-STAT SUGAR FREE 64)  30 mL Per Tube QID   guaiFENesin-dextromethorphan  7.5 mL Per Tube Q6H   lidocaine  1 patch Transdermal Q24H   magnesium oxide  400 mg Oral BID   mouth rinse  15 mL Mouth Rinse q12n4p   mometasone-formoterol  2 puff Inhalation BID   pantoprazole sodium  40 mg Per Tube Daily   romiPLOStim  5 mcg/kg Subcutaneous Weekly   sodium chloride flush  10-40 mL Intracatheter  Q12H   Continuous Infusions:  sodium chloride 10 mL/hr at 10/11/18 1737   sodium chloride Stopped (10/18/18 0000)   anticoagulant sodium citrate     dextrose 5 % and 0.45% NaCl 50 mL/hr at 10/21/18 1200   feeding supplement (VITAL 1.5 CAL) Stopped (10/18/18 0430)   PRN Meds:.sodium chloride, Place/Maintain arterial line **AND** sodium chloride, acetaminophen (TYLENOL) oral liquid 160 mg/5 mL, albuterol, alteplase, anticoagulant sodium citrate, dextrose, fentaNYL (SUBLIMAZE) injection, midazolam, phenol, sodium chloride flush      Subjective:   Krystal Short was seen and examined today.  Much alert and awake and oriented today, no fevers.  Feels improving.  Hypoglycemia this morning, CBG 59.  Denies any nausea vomiting or diarrhea.  Tolerating regular diet but not a good appetite.  Patient denies dizziness, abdominal pain, chest pain or shortness of breath   Objective:   Vitals:   10/21/18 0900 10/21/18 1000 10/21/18 1100 10/21/18 1200  BP: 112/80 126/87 122/84 119/80  Pulse:      Resp: (!) 29 (!) 22 (!) 25 (!) 26  Temp:      TempSrc:      SpO2: 99% 94% 100% 97%  Weight:      Height:        Intake/Output Summary (Last 24 hours) at 10/21/2018 1251 Last data filed at 10/21/2018 1200 Gross per 24 hour  Intake 536.91 ml  Output 250 ml  Net 286.91 ml     Wt Readings from Last 3 Encounters:  10/21/18 47.2 kg  10/04/18 49.3 kg  09/24/18 50.3 kg    Physical Exam  General: Alert and oriented x 3, NAD, pleasant and cooperative  Eyes:  HEENT:  Atraumatic, normocephalic  Cardiovascular: S1 S2 clear, no murmurs, RRR. No pedal edema b/l  Respiratory: CTAB, no wheezing, rales or rhonchi  Gastrointestinal: Soft, nontender, nondistended, NBS  Ext: no pedal edema bilaterally  Neuro: no new deficits  Musculoskeletal: No cyanosis, clubbing  Skin: No rashes  Psych: Normal affect and demeanor, alert and oriented x3        Data Reviewed:  I have personally  reviewed following labs and imaging studies  Micro Results No results found for this or any previous visit (from the past 240 hour(s)).  Radiology Reports Dg Chest 2 View  Result Date: 10/05/2018 CLINICAL DATA:  Suspected sepsis EXAM: CHEST - 2 VIEW COMPARISON:  10/02/2018, 10/01/2018, 09/19/2018, 05/28/2018, 06/03/2017, 03/11/2013, 10/15/2017, 07/12/2018 FINDINGS: Small right-sided pleural effusion. Underlying reticular interstitial changes suggesting component of chronic disease, however there is interim worsening of bilateral right greater than left airspace disease, suspicious for acute edema or pneumonia superimposed on chronic underlying disease. Stable cardiomediastinal silhouette. No pneumothorax. IMPRESSION: 1. Interval worsening of bilateral right greater than left airspace disease, suspicious for acute edema or pneumonia superimposed on underlying chronic disease. Small right-sided pleural effusion. Electronically Signed   By: Donavan Foil M.D.   On: 10/05/2018 23:52   Dg Abd 1 View  Result Date: 10/15/2018 CLINICAL DATA:  Check feeding tube placement EXAM: ABDOMEN - 1 VIEW COMPARISON:  10/07/2018 FINDINGS: Nasogastric catheter has been removed. A weighted feeding catheter is now seen within the mid stomach. Scattered large and small bowel gas is noted. No bony abnormality is seen. IMPRESSION: Feeding catheter within the mid stomach. Electronically Signed   By: Inez Catalina M.D.   On: 10/15/2018 14:05   Dg Abd 1 View  Result Date: 10/07/2018 CLINICAL DATA:  Abdominal distention. OG tube placement. EXAM: ABDOMEN - 1 VIEW COMPARISON:  CT scan of the abdomen dated 08/02/2018 FINDINGS: There is gaseous distention of the stomach. The tip of the OG tube lies in the distal stomach. No dilated large or small bowel. Hepatosplenomegaly. Infiltrates at the lung bases. Bones are normal. IMPRESSION: OG tube tip lies in the distal stomach. Gaseous distention of the stomach. Electronically Signed   By:  Lorriane Shire M.D.   On: 10/07/2018 09:58   Ct Head Wo Contrast  Result Date: 10/07/2018 CLINICAL DATA:  Altered mental status, intubation, HIV EXAM: CT HEAD WITHOUT CONTRAST TECHNIQUE: Contiguous axial images were obtained from the base of the skull through the vertex without intravenous contrast. COMPARISON:  None. FINDINGS: Brain: No evidence of acute infarction, hemorrhage, hydrocephalus, extra-axial collection or mass lesion/mass effect. Vascular: No hyperdense vessel or unexpected calcification. Skull: Normal. Negative for fracture or focal lesion. Sinuses/Orbits: No acute finding. Other: None. IMPRESSION: No acute intracranial pathology. Electronically Signed   By: Eddie Candle M.D.   On: 10/07/2018 16:28   Ct Chest Wo Contrast  Result Date: 10/16/2018 CLINICAL DATA:  Acute respiratory failure EXAM: CT CHEST WITHOUT CONTRAST TECHNIQUE: Multidetector CT imaging of the chest was performed following the standard protocol without IV contrast. COMPARISON:  None. FINDINGS: Cardiovascular: Heart is normal size.  Aorta is normal caliber. Mediastinum/Nodes: Enlarged mediastinal lymph nodes. Index right paratracheal node has a short axis diameter of 11 mm. These are stable since prior study. No axillary adenopathy. Difficult to assess the hila without intravenous contrast. Lungs/Pleura: Extensive interstitial thickening, ground-glass opacities. More confluent consolidation seen in the left upper lobe and both lower lobes. Trace bilateral effusions. Upper Abdomen: Imaging into the upper abdomen shows no acute findings. Probable splenomegaly. The spleen is not imaged in its entirety. Musculoskeletal: Chest wall soft tissues are unremarkable. No acute bony abnormality. IMPRESSION: Diffuse ground-glass airspace disease throughout the lungs with interlobular septal thickening. More confluent consolidation in the left upper lobe and both superior segments of the lower lobes. This is similar to prior study and  presumably reflects atypical infection there is mediastinal adenopathy which is also stable. Sarcoidosis could have a similar appearance in the appropriate clinical setting. Trace bilateral effusions. Probable splenomegaly. The spleen is not imaged in its entirety but appears enlarged in the visualized upper abdomen. Recommend clinical correlation. Electronically Signed   By: Lennette Bihari  [place] M.D.   On: 10/16/2018 19:05   US Renal  Result Date: 10/09/2018 CLINICAL DATA:  Acute kidney injury. EXAM: RENAL / URINARY TRACT ULTRASOUND COMPLETE COMPARISON:  08/08/2018 FINDINGS: Right Kidney: Renal measurements: 11.2 x 4.4 x 5.8 cm = volume: 149 mL. Echogenicity is diffusely increased. No hydronephrosis. Left Kidney: Renal measurements: 12.4 x 5.5 x 4.6 cm = volume: 163 mL. Diffusely increased echogenicity without hydronephrosis. Bladder: Decompressed by Foley catheter. Note: Small to moderate bilateral pleural effusions noted. IMPRESSION: 1. Bilateral echogenic kidneys without hydronephrosis. Imaging features compatible with medical renal disease. 2. Bilateral pleural effusions. Electronically Signed   By: Misty Stanley M.D.   On: 10/09/2018 20:03   Dg Chest Port 1 View  Result Date: 10/15/2018 CLINICAL DATA:  Hypoxia. EXAM: PORTABLE CHEST 1 VIEW COMPARISON:  Radiograph of October 14, 2018. FINDINGS: The heart size and mediastinal contours are within normal limits. Endotracheal and nasogastric tubes have been removed. Bilateral internal jugular catheters are unchanged in position. Stable bilateral bibasilar opacities are noted concerning for possible atelectasis or inflammation. The visualized skeletal structures are unremarkable. IMPRESSION: Endotracheal and nasogastric tubes have been removed. Stable bilateral lung opacities as described above. Electronically Signed   By: Marijo Conception M.D.   On: 10/15/2018 07:08   Dg Chest Port 1 View  Result Date: 10/14/2018 CLINICAL DATA:  resp failure EXAM: PORTABLE CHEST 1  VIEW COMPARISON:  10/13/2018 FINDINGS: Endotracheal tube is in place, tip 4.8 centimeters above the carina. RIGHT IJ central line tip overlies the superior vena cava. LEFT IJ central line tip overlies the superior vena cava. Patient is rotated towards the LEFT. Nasogastric tube tip is off the image. The heart is normal in size. Diffuse ground-glass opacities are identified throughout the lungs bilaterally without significant change. No new consolidation. IMPRESSION: Stable bilateral pulmonary infiltrates. Electronically Signed   By: Nolon Nations M.D.   On: 10/14/2018 09:03   Dg Chest Port 1 View  Result Date: 10/13/2018 CLINICAL DATA:  Acute kidney injury secondary to shock and hypoperfusion. Intubated patient. EXAM: PORTABLE CHEST 1 VIEW COMPARISON:  Single-view of the chest 10/12/2018 and 10/10/2018. FINDINGS: Support tubes and lines are unchanged and project in good position. Diffuse hazy bilateral airspace disease has improved. No pneumothorax or pleural fluid. Heart size is normal. IMPRESSION: No change in support apparatus. Improved bilateral airspace disease. Electronically Signed   By: Inge Rise M.D.   On: 10/13/2018 08:33   Dg Chest Port 1 View  Result Date: 10/12/2018 CLINICAL DATA:  Acute respiratory failure EXAM: PORTABLE CHEST 1 VIEW COMPARISON:  Two days ago FINDINGS: Endotracheal tube tip between the clavicular heads and carina. Bilateral central line in the IJ with tips at the SVC. The orogastric tube reaches the stomach. Diffuse hazy opacification of the lungs but significantly improved aeration from prior. No visible pleural fluid or air leak. IMPRESSION: 1. Unremarkable hardware positioning. 2. Diffuse airspace disease that is improved from 2 days ago. Electronically Signed   By: Monte Fantasia M.D.   On: 10/12/2018 08:16   Dg Chest Port 1 View  Result Date: 10/10/2018 CLINICAL DATA:  ET positioning EXAM: PORTABLE CHEST 1 VIEW COMPARISON:  Radiograph October 08, 2018  FINDINGS: Endotracheal tube is positioned in the mid trachea approximately 4 cm from the carina right IJ catheter terminates at the superior cavoatrial junction. Left IJ dual lumen catheter terminates at the caval-brachiocephalic confluence. Transesophageal tube tip and side port distal to the GE junction. Pacer pad overlies the left chest. Persistent  bilateral airspace opacities demonstrate some interval clearing in the upper lobes. Basilar opacities are similar to prior. Cardiomediastinal silhouette is largely obscured by overlying opacity. No visible pneumothorax or discernible effusion. No acute osseous or soft tissue abnormality. IMPRESSION: 1. Satisfactory positioning of the lines and tubes, as above. 2. Persistent bilateral airspace opacities with some interval clearing in the upper lobes. Electronically Signed   By: Lovena Le M.D.   On: 10/10/2018 05:27   Dg Chest Port 1 View  Result Date: 10/09/2018 CLINICAL DATA:  ARDS EXAM: PORTABLE CHEST 1 VIEW COMPARISON:  Radiograph 10/08/2018 FINDINGS: Pacer pads overlie the chest. Right IJ catheter tip terminates at the level of the superior cavoatrial junction. A left IJ dual lumen catheter tip terminates near the brachiocephalic-caval confluence. Endotracheal tube is positioned in the mid trachea, 3.8 cm from the carina. A transesophageal tube tip and side port are distal to the GE junction. There are diffuse consolidative opacities throughout both lungs on a background of more hazy interstitial opacity. Suspect small right effusion. No pneumothorax. Cardiac silhouette is largely obscured by the overlying opacities. IMPRESSION: Extensive bilateral airspace opacities, similar in extent to most recent comparison. Stable, satisfactory positioning of lines and tubes, as above Electronically Signed   By: Lovena Le M.D.   On: 10/09/2018 05:40   Dg Chest Port 1 View  Result Date: 10/08/2018 CLINICAL DATA:  CVL placement EXAM: PORTABLE CHEST 1 VIEW COMPARISON:   10/08/2018, 10/07/2018, 10/05/2018 FINDINGS: Endotracheal tube tip is about 4.3 cm superior to the carina. Esophageal tube tip below the diaphragm but non included. Right IJ central venous catheter tip over the SVC. Left IJ central venous catheter tip over the upper SVC. No left pneumothorax. Extensive bilateral interstitial and alveolar disease. Obscured cardiomediastinal silhouette. Probable small effusions. IMPRESSION: 1. New left IJ central venous catheter with tip over the SVC. No pneumothorax. Support lines and tubes otherwise grossly stable in position 2. No significant change in extensive interstitial and alveolar disease bilaterally. Electronically Signed   By: Donavan Foil M.D.   On: 10/08/2018 14:06   Dg Chest Port 1 View  Result Date: 10/08/2018 CLINICAL DATA:  Check endotracheal tube placement EXAM: PORTABLE CHEST 1 VIEW COMPARISON:  10/07/2018 FINDINGS: Cardiac shadow is stable. Right jugular central line, gastric catheter and endotracheal tube are again seen. The endotracheal tube now lies 0.6 cm above the carina slightly withdrawn when compared with the prior exam. Persistent bilateral airspace opacities are noted worst in the right lung base stable from the previous day. Small right-sided effusion is noted superiorly. Bony abnormality is noted. IMPRESSION: Tubes and lines as described above. Stable airspace opacities bilaterally. Electronically Signed   By: Inez Catalina M.D.   On: 10/08/2018 08:43   Portable Chest X-ray  Result Date: 10/07/2018 CLINICAL DATA:  Intubation EXAM: PORTABLE CHEST 1 VIEW COMPARISON:  October 06, 2018 FINDINGS: ET tube is 3 cm above the level of carina. Enteric tube is seen coursing below the diaphragm. A right-sided PICC is seen at the superior cavoatrial junction. Patchy airspace opacities are seen throughout both lungs, right greater than left. The cardiomediastinal silhouette is unchanged. IMPRESSION: ET tube 3 cm above the carina. Unchanged patchy airspace  opacities, right greater than left. Electronically Signed   By: Prudencio Pair M.D.   On: 10/07/2018 10:05   Dg Chest Port 1 View  Result Date: 10/06/2018 CLINICAL DATA:  33 year old female with centralized placement. EXAM: PORTABLE CHEST 1 VIEW COMPARISON:  Chest radiograph dated 10/05/2018 FINDINGS: Right IJ  central venous line with tip over the cavoatrial junction. No pneumothorax. Bilateral airspace opacities similar to prior radiograph. There is a small right pleural effusion. No acute osseous pathology. IMPRESSION: 1. Interval placement of a right IJ central venous line with tip in the region of the cavoatrial junction. No pneumothorax. 2. No significant interval change in the bilateral airspace opacities. Electronically Signed   By: Anner Crete M.D.   On: 10/06/2018 04:04   Dg Chest Port 1 View  Result Date: 10/02/2018 CLINICAL DATA:  Tachypnea EXAM: PORTABLE CHEST 1 VIEW COMPARISON:  Yesterday FINDINGS: Generalized reticular pulmonary opacity, present on multiple prior studies. No pleural fluid or pneumothorax. Normal heart size. IMPRESSION: Chronic reticular pulmonary opacity without acute superimposed finding. Electronically Signed   By: Monte Fantasia M.D.   On: 10/02/2018 05:45   Dg Chest Port 1 View  Result Date: 10/01/2018 CLINICAL DATA:  Cough, pancytopenia EXAM: PORTABLE CHEST 1 VIEW COMPARISON:  09/19/2018 FINDINGS: Stable cardiomediastinal contours. Extensive, diffuse reticulonodular opacities throughout both lungs, markedly progressed from prior. No pleural effusion. No pneumothorax. Osseous structures intact. IMPRESSION: Extensive reticulonodular opacities throughout both lungs suggestive of an atypical infection in an immunocompromised patient. Electronically Signed   By: Davina Poke M.D.   On: 10/01/2018 18:38   Dg Swallowing Func-speech Pathology  Result Date: 10/18/2018 Objective Swallowing Evaluation: Type of Study: MBS-Modified Barium Swallow Study  Patient Details  Name: Hibah Odonnell MRN: [id number] Date of Birth: 1985/12/03 Today's Date: 10/18/2018 Time: SLP Start Time (ACUTE ONLY): 1000 -SLP Stop Time (ACUTE ONLY): 7989 SLP Time Calculation (min) (ACUTE ONLY): 15 min Past Medical History: Past Medical History: Diagnosis Date  Acute hyponatremia 06/03/2017  Anemia   CAP (community acquired pneumonia) 06/03/2017  Facial dermatitis 06/03/2017  HIV (human immunodeficiency virus infection) ([place])   diagnosed in April 2019  Pleurisy 06/03/2017  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 ([place])   Thrush of mouth and esophagus ([place])   UTI (urinary tract infection)  Past Surgical History: Past Surgical History: Procedure Laterality Date  BIOPSY  07/12/2018  Procedure: BIOPSY;  Surgeon: Laurin Coder, MD;  Location: [facility];  Service: Endoscopy;;  BRONCHIAL WASHINGS  07/12/2018  Procedure: BRONCHIAL WASHINGS;  Surgeon: Laurin Coder, MD;  Location: [facility];  Service: Endoscopy;;  ENDOBRONCHIAL ULTRASOUND N/A 07/12/2018  Procedure: ENDOBRONCHIAL ULTRASOUND;  Surgeon: Laurin Coder, MD;  Location: [facility];  Service: Endoscopy;  Laterality: N/A;  FINE NEEDLE ASPIRATION BIOPSY  07/12/2018  Procedure: FINE NEEDLE ASPIRATION BIOPSY;  Surgeon: Laurin Coder, MD;  Location: [facility];  Service: Endoscopy;;  NO PAST SURGERIES    VIDEO BRONCHOSCOPY N/A 07/12/2018  Procedure: VIDEO BRONCHOSCOPY WITHOUT FLUORO;  Surgeon: Laurin Coder, MD;  Location: [facility];  Service: Endoscopy;  Laterality: N/A; HPI: 32 yo F with history of HIV/AIDS admitted on 8/29 with progressive shortness of breath. History of multiple admissions over the last year for waxing / waning pulmonary infiltrates. Patient decompensated on 8/31 requiring initiation of mechanical intubation and later pressure support and CRRT, was extubated 10/14/2018.  Swallow eval ordered.  Pt denies h/o dysphagia.  .Pt was taken off CRRT yesterday.  Today is tachycardic.  She reports she is  receiving ice chips and her spouse gave her "lots of them" yesterday.  Pt had a small bore feeding tube for nutrition, however, this was found out thursday 10/17/2018 night. Pt to have MBS today.  Subjective: Pt seen in radiology for MBS. RN in attendance. Assessment / Plan / Recommendation [facility]  IP CLINICAL IMPRESSIONS 10/18/2018 Clinical Impression Pt presents with essentially normal oropharyngeal swallow function. One episode of very trace flash penetration was noted with large straw sips, however, penetrate cleared spontaneously, and no other episodes of penetration were noted. Otherwise, normal swallow without oral residue, post-swallow pharyngeal residue, or aspiration noted. Recommend beginning regular texture solids and thin liquids, meds whole with liquid. Safe swallow precautions posted at [facility] and reviewed with pt and her mother. SLP will follow for diet tolerance and education.  SLP Visit Diagnosis Dysphagia, oropharyngeal phase (R13.12)     Impact on safety and function Mild aspiration risk   [facility] IP TREATMENT RECOMMENDATION 10/18/2018 Treatment Recommendations Therapy as outlined in treatment plan below   Prognosis 10/15/2018 Prognosis for Safe Diet Advancement Good     [facility] IP DIET RECOMMENDATION 10/18/2018 SLP Diet Recommendations Regular solids Liquid Administration via Straw;Cup Medication Administration Whole meds with liquid Compensations Slow rate;Small sips/bites;Minimize environmental distractions Postural Changes Seated upright at 90 degrees   CH[facility] IP OTHER RECOMMENDATIONS 10/18/2018 Recommended Consults -- Oral Care Recommendations Oral care QID Other Recommendations --   [facility] IP FOLLOW UP RECOMMENDATIONS 10/18/2018 Follow up Recommendations None   [facility] IP FREQUENCY AND DURATION 10/18/2018 Speech Therapy Frequency (ACUTE ONLY) min 1 x/week Treatment Duration 1 week;2 weeks      CHL IP ORAL PHASE 10/18/2018 Oral Phase WFL  CHL IP PHARYNGEAL PHASE 10/18/2018 Pharyngeal Phase Impaired Pharyngeal- Nectar Cup WFL  Pharyngeal- Thin Cup Reduced airway/laryngeal closure;Penetration/Aspiration during swallow Pharyngeal Material enters airway, remains ABOVE vocal cords then ejected out;Material does not enter airway Pharyngeal- Puree WFL Pharyngeal- Regular WFL  CHL IP CERVICAL ESOPHAGEAL PHASE 10/18/2018 Cervical Esophageal Phase [facility] Celia B. Quentin Ore [facility], CCC-SLP Speech Language Pathologist [phone] [name] 10/18/2018, 10:40 AM              Vas [place] Lower Extremity Venous (dvt)  Result Date: 10/07/2018  Lower Venous Study Indications: SOB, and Edema.  Risk Factors: Advanced HIV/AIDS. Limitations: Lights on in room, patient newly vented. Comparison Study: Prior study from 06/20/17 is available for comparison Performing Technologist: Sharion Dove RVS  Examination Guidelines: A complete evaluation includes B-mode imaging, spectral Doppler, color Doppler, and power Doppler as needed of all accessible portions of each vessel. Bilateral testing is considered an integral part of a complete examination. Limited examinations for reoccurring indications may be performed as noted.  +---------+---------------+---------+-----------+----------+--------------+  RIGHT     Compressibility Phasicity Spontaneity Properties Thrombus Aging  +---------+---------------+---------+-----------+----------+--------------+  CFV       Full            Yes       Yes                                    +---------+---------------+---------+-----------+----------+--------------+  SFJ       Full                                                             +---------+---------------+---------+-----------+----------+--------------+  FV Prox   Full                                                             +---------+---------------+---------+-----------+----------+--------------+  FV Mid    Full                                                             +---------+---------------+---------+-----------+----------+--------------+  FV Distal Full                                                              +---------+---------------+---------+-----------+----------+--------------+  PFV       Full                                                             +---------+---------------+---------+-----------+----------+--------------+  POP       Full            Yes       Yes                                    +---------+---------------+---------+-----------+----------+--------------+  PTV       Full                                                             +---------+---------------+---------+-----------+----------+--------------+  PERO      Full                                                             +---------+---------------+---------+-----------+----------+--------------+   +---------+---------------+---------+-----------+----------+--------------+  LEFT      Compressibility Phasicity Spontaneity Properties Thrombus Aging  +---------+---------------+---------+-----------+----------+--------------+  CFV       Full            Yes       Yes                                    +---------+---------------+---------+-----------+----------+--------------+  SFJ       Full                                                             +---------+---------------+---------+-----------+----------+--------------+  FV Prox   Full                                                             +---------+---------------+---------+-----------+----------+--------------+  FV Mid    Full                                                             +---------+---------------+---------+-----------+----------+--------------+  FV Distal Full                                                             +---------+---------------+---------+-----------+----------+--------------+  PFV       Full                                                             +---------+---------------+---------+-----------+----------+--------------+  POP       Full            Yes       Yes                                     +---------+---------------+---------+-----------+----------+--------------+  PTV       Full                                                             +---------+---------------+---------+-----------+----------+--------------+  PERO                                                       Not visualized  +---------+---------------+---------+-----------+----------+--------------+     Summary: Right: Findings appear essentially unchanged compared to previous examination. There is no evidence of deep vein thrombosis in the lower extremity. Left: Findings appear essentially unchanged compared to previous examination. There is no evidence of deep vein thrombosis in the lower extremity. However, portions of this examination were limited- see technologist comments above.  *See table(s) above for measurements and observations. Electronically signed by Monica Martinez MD on 10/07/2018 at 6:03:03 PM.    Final    Vas [place] Upper Extremity Venous Duplex  Result Date: 10/15/2018 UPPER VENOUS STUDY  Indications: Edema Limitations: Bandages and Edema and size of the veins. Comparison Study: No previous study available Performing Technologist: Toma Copier RVS  Examination Guidelines: A complete evaluation includes B-mode imaging, spectral Doppler, color Doppler, and power Doppler as needed of all accessible portions of each vessel. Bilateral testing is considered an integral part of a complete examination. Limited examinations for reoccurring indications may be performed as noted.  Right Findings: +----------+------------+---------+-----------+----------+---------------------+  RIGHT      Compressible Phasicity Spontaneous Properties        Summary         +----------+------------+---------+-----------+----------+---------------------+  IJV  Not visualized due to                                                               dialysis access      +----------+------------+---------+-----------+----------+---------------------+  Subclavian     Full        Yes        Yes                                       +----------+------------+---------+-----------+----------+---------------------+  Axillary       Full        Yes        Yes                                       +----------+------------+---------+-----------+----------+---------------------+  Brachial       Full        Yes        Yes                                       +----------+------------+---------+-----------+----------+---------------------+  Radial         Full                                                             +----------+------------+---------+-----------+----------+---------------------+  Ulnar          Full                                                             +----------+------------+---------+-----------+----------+---------------------+  Cephalic                                                 Not visualized due to                                                            edema and size of the                                                                    vein           +----------+------------+---------+-----------+----------+---------------------+  Basilic                                                  Not visualized due to                                                            the size of the vein   +----------+------------+---------+-----------+----------+---------------------+ Technically limited due to dialysis access in the neck, size and edema.  Left Findings: +----------+------------+---------+-----------+----------+---------------------+  LEFT       Compressible Phasicity Spontaneous Properties        Summary         +----------+------------+---------+-----------+----------+---------------------+  IJV                                                      Not visualized due to                                                                IV placement        +----------+------------+---------+-----------+----------+---------------------+  Subclavian                                               Not visualized due to                                                               bandages for IV                                                                     placement        +----------+------------+---------+-----------+----------+---------------------+  Axillary       Full        Yes        Yes                                       +----------+------------+---------+-----------+----------+---------------------+  Brachial       Full        Yes        Yes                                       +----------+------------+---------+-----------+----------+---------------------+  Radial         Full                                                             +----------+------------+---------+-----------+----------+---------------------+  Ulnar          Full                                                             +----------+------------+---------+-----------+----------+---------------------+  Cephalic                                                 Not visualize due to                                                                size and edema      +----------+------------+---------+-----------+----------+---------------------+  Basilic                                                  Not visualized due to                                                               size and edema      +----------+------------+---------+-----------+----------+---------------------+ Technically limited due to bandages, IV placement, size. and edema  Summary:  Right: No evidence of deep vein thrombosis in the upper extremity. However, unable to visualize all veins. See comments listed above. No evidence of superficial vein thrombosis . However, unable to visualize See comments listed above. No evidence of thrombosis in the subclavian. This was a limited study.  Left: No evidence of  deep vein thrombosis in the upper extremity. However, unable to visualize all veins. See comments listed avone. No evidence of superficial vein thrombosis . However, unable to visualize all veins. See comments listed avone. Unable to visualize the subclavian/ See comments listed abone. This was a limited study.  *See table(s) above for measurements and observations.  Diagnosing physician: Servando Snare MD Electronically signed by Servando Snare MD on 10/15/2018 at 1:15:33 PM.    Final    [place] Ekg Site Rite  Result Date: 10/18/2018 If Site Rite image not attached, placement could not be confirmed due to current cardiac rhythm.   Lab Data:  CBC: Recent Labs  Lab 10/15/18 0330 10/16/18 0725 10/17/18 0423 10/17/18 1500 10/18/18 0425 10/19/18 0430  WBC 25.3* 16.8* 16.5*  --  13.5* 12.3*  NEUTROABS  --  13.4* 13.5*  --   --   --   HGB 8.1* 7.8* 6.1* 8.1* 8.1* 8.2*  HCT 25.0* 24.6* 19.3* 25.1* 25.2* 25.4*  MCV 93.6 96.5 97.5  --  93.0 95.1  PLT 16* 21* 26*  --  30* 45*   Basic Metabolic Panel: Recent Labs  Lab 10/17/18 0423 10/18/18 0425 10/19/18 0430 10/20/18 0616 10/20/18 0617 10/21/18 0519  NA 138 142 142 142  --  144  K 4.4 3.7 3.4* 3.9  --  4.0  CL 104 106 110 110  --  113*  CO2 22 23 20* 20*  --  23  GLUCOSE 123* 150* 123* 164*  --  90  BUN 58* 78* 67* 39*  --  29*  CREATININE 1.57* 1.52* 1.27* 0.94  --  0.72  CALCIUM 7.3* 8.3* 8.1* 8.0*  --  7.8*  MG 2.0 1.8 1.2*  --  1.2* 1.7  PHOS 2.6 3.4 3.2 2.8  --  4.0   GFR: Estimated Creatinine Clearance: 75.9 mL/min (by C-G formula based on SCr of 0.72 mg/dL). Liver Function Tests: Recent Labs  Lab 10/16/18 0350 10/17/18 0423 10/18/18 0425 10/19/18 0430 10/20/18 0616 10/21/18 0519  AST 58*  --   --   --   --   --   ALT 35  --   --   --   --   --   ALKPHOS 370*  --   --   --   --   --   BILITOT 2.5*  --   --   --   --   --   PROT 6.8  --   --   --   --   --   ALBUMIN 2.4*   2.5* 2.3* 2.3* 2.3* 2.4* 2.7*   No results for  input(s): LIPASE, AMYLASE in the last 168 hours. No results for input(s): AMMONIA in the last 168 hours. Coagulation Profile: No results for input(s): INR, PROTIME in the last 168 hours. Cardiac Enzymes: No results for input(s): CKTOTAL, CKMB, CKMBINDEX, TROPONINI in the last 168 hours. BNP (last 3 results) No results for input(s): PROBNP in the last 8760 hours. HbA1C: No results for input(s): HGBA1C in the last 72 hours. CBG: Recent Labs  Lab 10/20/18 0419 10/20/18 0523 10/20/18 0743 10/21/18 0737 10/21/18 0826  GLUCAP 50* 129* 85 59* 79   Lipid Profile: No results for input(s): CHOL, HDL, LDLCALC, TRIG, CHOLHDL, LDLDIRECT in the last 72 hours. Thyroid Function Tests: No results for input(s): TSH, T4TOTAL, FREET4, T3FREE, THYROIDAB in the last 72 hours. Anemia Panel: No results for input(s): VITAMINB12, FOLATE, FERRITIN, TIBC, IRON, RETICCTPCT in the last 72 hours. Urine analysis:    Component Value Date/Time   COLORURINE AMBER (A) 10/01/2018 1832   APPEARANCEUR HAZY (A) 10/01/2018 1832   LABSPEC 1.006 10/01/2018 1832   PHURINE 5.0 10/01/2018 1832   GLUCOSEU NEGATIVE 10/01/2018 1832   HGBUR NEGATIVE 10/01/2018 1832   BILIRUBINUR NEGATIVE 10/01/2018 1832   KETONESUR NEGATIVE 10/01/2018 [place] NEGATIVE 10/01/2018 1832   UROBILINOGEN 1.0 12/03/2011 0550   NITRITE NEGATIVE 10/01/2018 1832   LEUKOCYTESUR NEGATIVE 10/01/2018 1832     Breeana Sawtelle M.D. [facility] 10/21/2018, 12:51 PM  Pager: [phone] Between 7am to 7pm - call Pager - [phone]  After 7pm go to [email or web address] - password [email or web address]  Call night coverage person covering after 7pm

## 2018-10-21 NOTE — Progress Notes (Signed)
BG 59 - Krystal Short&OX4 and asymptomatic.  Gave apple juice.  Will recheck BG.

## 2018-10-22 ENCOUNTER — Inpatient Hospital Stay: Payer: 59

## 2018-10-22 DIAGNOSIS — B59 Pneumocystosis: Secondary | ICD-10-CM | POA: Diagnosis not present

## 2018-10-22 DIAGNOSIS — A419 Sepsis, unspecified organism: Secondary | ICD-10-CM | POA: Diagnosis not present

## 2018-10-22 DIAGNOSIS — R161 Splenomegaly, not elsewhere classified: Secondary | ICD-10-CM

## 2018-10-22 DIAGNOSIS — N170 Acute kidney failure with tubular necrosis: Secondary | ICD-10-CM | POA: Diagnosis not present

## 2018-10-22 DIAGNOSIS — R590 Localized enlarged lymph nodes: Secondary | ICD-10-CM

## 2018-10-22 DIAGNOSIS — Z20828 Contact with and (suspected) exposure to other viral communicable diseases: Secondary | ICD-10-CM | POA: Diagnosis not present

## 2018-10-22 DIAGNOSIS — J80 Acute respiratory distress syndrome: Secondary | ICD-10-CM | POA: Diagnosis not present

## 2018-10-22 DIAGNOSIS — R6521 Severe sepsis with septic shock: Secondary | ICD-10-CM | POA: Diagnosis not present

## 2018-10-22 DIAGNOSIS — Z0184 Encounter for antibody response examination: Secondary | ICD-10-CM | POA: Diagnosis not present

## 2018-10-22 DIAGNOSIS — B20 Human immunodeficiency virus [HIV] disease: Secondary | ICD-10-CM | POA: Diagnosis not present

## 2018-10-22 DIAGNOSIS — G9341 Metabolic encephalopathy: Secondary | ICD-10-CM | POA: Diagnosis not present

## 2018-10-22 LAB — RENAL FUNCTION PANEL
Albumin: 2.4 g/dL — ABNORMAL LOW (ref 3.5–5.0)
Anion gap: 10 (ref 5–15)
BUN: 16 mg/dL (ref 6–20)
CO2: 20 mmol/L — ABNORMAL LOW (ref 22–32)
Calcium: 7.4 mg/dL — ABNORMAL LOW (ref 8.9–10.3)
Chloride: 111 mmol/L (ref 98–111)
Creatinine, Ser: 0.58 mg/dL (ref 0.44–1.00)
GFR calc Af Amer: >60 mL/min (ref >60)
GFR calc non Af Amer: >60 mL/min (ref >60)
Glucose, Bld: 82 mg/dL (ref 70–99)
Phosphorus: 3.7 mg/dL (ref 2.5–4.6)
Potassium: 3.6 mmol/L (ref 3.5–5.1)
Sodium: 141 mmol/L (ref 135–145)

## 2018-10-22 LAB — CBC WITH DIFFERENTIAL/PLATELET
Abs Immature Granulocytes: 0.07 10*3/uL (ref 0.00–0.07)
Basophils Absolute: 0 10*3/uL (ref 0.0–0.1)
Basophils Relative: 0 %
Eosinophils Absolute: 0.4 10*3/uL (ref 0.0–0.5)
Eosinophils Relative: 4 %
HCT: 22.9 % — ABNORMAL LOW (ref 36.0–46.0)
Hemoglobin: 7.1 g/dL — ABNORMAL LOW (ref 12.0–15.0)
Immature Granulocytes: 1 %
Lymphocytes Relative: 10 %
Lymphs Abs: 0.8 10*3/uL (ref 0.7–4.0)
MCH: 30.6 pg (ref 26.0–34.0)
MCHC: 31 g/dL (ref 30.0–36.0)
MCV: 98.7 fL (ref 80.0–100.0)
Monocytes Absolute: 0.4 10*3/uL (ref 0.1–1.0)
Monocytes Relative: 6 %
Neutro Abs: 6.3 10*3/uL (ref 1.7–7.7)
Neutrophils Relative %: 79 %
Platelets: 58 10*3/uL — ABNORMAL LOW (ref 150–400)
RBC: 2.32 MIL/uL — ABNORMAL LOW (ref 3.87–5.11)
RDW: 19.3 % — ABNORMAL HIGH (ref 11.5–15.5)
WBC: 7.9 10*3/uL (ref 4.0–10.5)
nRBC: 1.1 % — ABNORMAL HIGH (ref 0.0–0.2)

## 2018-10-22 LAB — GLUCOSE, CAPILLARY: Glucose-Capillary: 151 mg/dL — ABNORMAL HIGH (ref 70–99)

## 2018-10-22 LAB — CMV DNA, QUANTITATIVE, PCR
CMV DNA Quant: 35400 IU/mL
Log10 CMV Qn DNA Pl: 4.549 log10 IU/mL

## 2018-10-22 LAB — BASIC METABOLIC PANEL
Anion gap: 6 (ref 5–15)
BUN: 16 mg/dL (ref 6–20)
CO2: 22 mmol/L (ref 22–32)
Calcium: 7.4 mg/dL — ABNORMAL LOW (ref 8.9–10.3)
Chloride: 112 mmol/L — ABNORMAL HIGH (ref 98–111)
Creatinine, Ser: 0.58 mg/dL (ref 0.44–1.00)
GFR calc Af Amer: >60 mL/min (ref >60)
GFR calc non Af Amer: >60 mL/min (ref >60)
Glucose, Bld: 81 mg/dL (ref 70–99)
Potassium: 3.6 mmol/L (ref 3.5–5.1)
Sodium: 140 mmol/L (ref 135–145)

## 2018-10-22 LAB — C-REACTIVE PROTEIN: CRP: 1.6 mg/dL — ABNORMAL HIGH (ref <1.0)

## 2018-10-22 LAB — SEDIMENTATION RATE: Sed Rate: 54 mm/hr — ABNORMAL HIGH (ref 0–22)

## 2018-10-22 LAB — FERRITIN: Ferritin: 5272 ng/mL — ABNORMAL HIGH (ref 11–307)

## 2018-10-22 MED ORDER — DEXAMETHASONE SODIUM PHOSPHATE 4 MG/ML IJ SOLN
12.0000 mg | INTRAMUSCULAR | Status: DC
  Administered 2018-10-22 – 2018-10-23 (×2): 12 mg via INTRAVENOUS
  Filled 2018-10-22 (×2): qty 3

## 2018-10-22 NOTE — Progress Notes (Signed)
[facility]                                                                              Patient Demographics  Krystal Short, is a 33 y.o. female, DOB - March 30, 1985, OIB:[id number]  Admit date - 10/05/2018   Admitting Physician Neena Rhymes, MD  Outpatient Primary MD for the patient is Lajean Manes, MD  Outpatient specialists:   LOS - 16  days   Medical records reviewed and are as summarized below:    Chief Complaint  Patient presents with   Shortness of Breath       Brief summary   Patient is an unfortunate 33 year old female history of HIV/AIDS, chronic anemia/thrombocytopenia requiring multiple transfusions in the past, who was admitted on 10/05/2018 with progressive shortness of breath and acute recurrent hypoxemic respiratory failure. Patient noted have been treated for PGP pneumonia, bacterial pneumonia and with steroids in the past. Patient noted to have some bone marrow suppression as well as platelets of just 9 which trended down from 16 early on in the month. Patient underwent a bone marrow biopsy, BAL. Patient decompensated during the hospitalization and on 10/07/2018 required initiation of mechanical ventilation with pressure support requiring pressors and CRRT. Patient noted to be having metabolic acidosis, acute kidney injury and subsequently anuric. Nephrology consulted, ID consulted and are following. Patient on PCCM service. Patient extubated 10/14/2018 and patient transferred to hospitalist service. PCCM following along.  [facility] assumed care on 9/9  Significant hospital events 8/29 patient was admitted with progressive shortness of breath and respiratory failure 8/30 PCCM was consulted,started solumedrol  80 mg q 12h 8/31 Intubated, FOB 9/1 started on CRRT 9/4 High dose decadron 20 mg IV qd started by heme due to concern of secondary [place]. Send soluble CD 25/ IL2Ra levels - returned on 9/9 as high > 6000 9/5 Weaning pressors, starting  PSV weans, improving CXR 9/7 - Awake on the vent.  No acute events. Weaning on pressure support, extubated on 9/7 9/8 -  Needed bipap respiratory distress. Now on [place] and doing well. Denies vaping . Admits to using DOWN PILLOW (feather) At home x 7 months - new. However, denies  any organic antigen (mold, birds, mildew, humidifer)  9/9  - down to 2L [place]. Per RN =SPB runing soft and MAP barely 65.  Anuric on negative balance with CRRT. Also, each night cRRT filter clots she gets more hypoxemic.  Recent Evaluation  03/08/18-03/10/18 Acute respiratory failure secondary to pneumonia (rhinovirus). tx with rocephin/ zithromax, Bactrim for PCP. Macrocytic anemia   04/22/2018- Flu A infection. Treated with tamiflu as outpatient  05/28/18- 05/31/18 admitted with respiratory failure, lung infiltrates.  Treated with Bactrim, prednisone with improvement  06/14/2018-07/16/2018 Bordetella bronchoseptica infection. Underwent FOB 07/12/2018. PJP test not done as BAL was lost.Biopsy scant lung parenchyma with fibrosis and reactive changes. Required 4 PRBC and 3 platelet infusion.  07/31/2018-08/22/2018 sepsis from suspected PJP pneumonia. Treated with steroids clindamycin and primaquine. Required 8 PRBC and 7 platelet transfusion. RVP positive for routine coronavirus.  09/03/2018-09/13/2018 generalized weakness and deconditioning. Required 3 PRBC 8 platelet transfusion.  7/30/2020IVIGwithout any improvement in platelet count. Given  prolonged prednisone taper.  09/11/18 Bone marrowBx all micro studies neg   10/01/2018-10/04/2018 presented with anemia and poor IV access. Given 2 PRBC 1 platelet transfusion. Required pressors. Treated with ceftriaxone and doxy for ? CAP.   Assessment & Plan   Acute respiratory failure, recurrent with hypoxemia, recurrent bilateral pulmonary infiltrates -Unclear etiology, felt secondary to acute lung injury, possibly hypersensitivity pneumonitis.  -Extubated on 9/7, BAL done,  positive for staph epidermidis, PGP negative -Blood cultures negative so far, patient received Bactrim, IV cefepime 8/29> 9/5 -Per ID recommendations, now on oral Zithromax -O2 sat stable, 100% on room air, per mother at bedside, patient appears to have progressively improved   Acute kidney injury, metabolic acidosis, hyperkalemia, hypocalcemia -Felt secondary to ATN versus cortical necrosis from septic shock, was anuric -Nephrology was consulted, was placed on CRRT on 9/1, now off - recommended continue strict I's and O's, daily labs, packed RBC transfusion as needed to improve renal perfusion as well -Creatinine resolved, patient tolerating diet, 0.72.  Nephrology has signed off.  Hypomagnesemia Stable, continue oral replacement  Hypophosphatemia Improved, secondary to CRRT losses, GI losses, replaced via IV  Persistent profound thrombocytopenia, anemia -Patient noted to have had extensive work-up by heme-onc with bone marrow biopsy with normal bone marrow on 8/5.  HIT antibody negative, MAHA negative -Patient was initially started on steroids for possible sarcoidosis but no granulomas noted on prior biopsies, tapered off.  Per ID, pulmonology, possibly could be secondary [place] from HIV or viral infections, has been ruled out several times for COVID,  -Status post 6 units platelets, 3 units packed RBC transfusion, transfuse for hemoglobin less than 7, platelet count < 20. -Hematology, Dr. Irene Limbo following, patient was placed on Decadron 20 mg daily, ended on 9/7 -Defer resumption of steroids to hematology and pulmonology. -Platelet count improving  HIV/AIDS On Biktarvy, continue atovaquone for PCP prophylaxis, Zithromax for MAI prophylaxis -Per ID  Acute metabolic encephalopathy In the setting of advanced HIV/AIDS, respiratory failure, acute kidney injury, profound anemia, thrombocytopenia -CT head negative, improving -Alert and oriented, at baseline per mother at the  bedside  Moderate protein calorie malnutrition - on regular diet -SLP evaluation done on 9/11, feeding tube was out on 9/10  Transaminitis -Improving  Severe generalized debility: -PT OT evaluation, out of bed to chair, increase mobility   Code Status: Full code DVT Prophylaxis: Prevalon boots, SCDs  family Communication: Discussed all imaging results, lab results, explained to the patient.  Discussed with patient's mother in detail at the bedside.  She feels patient has much improved from the time of admission.  Patient is also working with PT.  Disposition Plan: Hopefully DC in next 24 to 48 hours, somewhat nauseous today Time Spent in minutes   35 minutes  Procedures:  Intubation, extubation CRRT  Consultants:   PCCM Infectious disease Heme oncology Nephrology  Antimicrobials:   Anti-infectives (From admission, onward)   Start     Dose/Rate Route Frequency Ordered Stop   10/19/18 1000  bictegravir-emtricitabine-tenofovir AF (BIKTARVY) 50-200-25 MG per tablet 1 tablet     1 tablet Oral Daily 10/18/18 1538     10/09/18 1000  emtricitabine-tenofovir AF (DESCOVY) 200-25 MG per tablet 1 tablet  Status:  Discontinued     1 tablet Per Tube Daily 10/08/18 1448 10/18/18 1538   10/09/18 1000  dolutegravir (TIVICAY) tablet 50 mg  Status:  Discontinued     50 mg Oral Daily 10/08/18 1448 10/18/18 1538   10/09/18 0600  ceFEPIme (MAXIPIME) 2 g  in sodium chloride 0.9 % 100 mL IVPB  Status:  Discontinued     2 g 200 mL/hr over 30 Minutes Intravenous Every 24 hours 10/08/18 0740 10/08/18 1450   10/08/18 1800  ceFEPIme (MAXIPIME) 2 g in sodium chloride 0.9 % 100 mL IVPB     2 g 200 mL/hr over 30 Minutes Intravenous Every 12 hours 10/08/18 1450 10/12/18 1746   10/08/18 1000  azithromycin (ZITHROMAX) tablet 1,200 mg     1,200 mg Oral Every Tue 10/06/18 0520     10/06/18 1800  ceFEPIme (MAXIPIME) 2 g in sodium chloride 0.9 % 100 mL IVPB  Status:  Discontinued     2 g 200 mL/hr over 30  Minutes Intravenous Every 12 hours 10/06/18 0632 10/08/18 0740   10/06/18 1000  atovaquone (MEPRON) 750 MG/5ML suspension 1,500 mg     1,500 mg Oral Daily 10/06/18 0520     10/06/18 1000  bictegravir-emtricitabine-tenofovir AF (BIKTARVY) 50-200-25 MG per tablet 1 tablet  Status:  Discontinued     1 tablet Oral Daily 10/06/18 0520 10/08/18 1448   10/06/18 0045  ceFEPIme (MAXIPIME) 2 g in sodium chloride 0.9 % 100 mL IVPB  Status:  Discontinued     2 g 200 mL/hr over 30 Minutes Intravenous Every 8 hours 10/06/18 0032 10/06/18 0632   10/06/18 0030  sulfamethoxazole-trimethoprim (BACTRIM) 260 mg of trimethoprim in dextrose 5 % 250 mL IVPB  Status:  Discontinued     260 mg of trimethoprim 266.3 mL/hr over 60 Minutes Intravenous Every 8 hours 10/06/18 0029 10/06/18 0801         Medications  Scheduled Meds:  atovaquone  1,500 mg Oral Daily   azithromycin  1,200 mg Oral Q Tue   benzonatate  100 mg Oral TID   bictegravir-emtricitabine-tenofovir AF  1 tablet Oral Daily   chlorhexidine  15 mL Mouth Rinse BID   Chlorhexidine Gluconate Cloth  6 each Topical Daily   dexamethasone  12 mg Intravenous Q24H   feeding supplement (ENSURE ENLIVE)  237 mL Oral BID BM   feeding supplement (PRO-STAT SUGAR FREE 64)  30 mL Per Tube QID   guaiFENesin-dextromethorphan  7.5 mL Per Tube Q6H   lidocaine  1 patch Transdermal Q24H   magnesium oxide  400 mg Oral BID   mouth rinse  15 mL Mouth Rinse q12n4p   mometasone-formoterol  2 puff Inhalation BID   pantoprazole sodium  40 mg Per Tube Daily   romiPLOStim  5 mcg/kg Subcutaneous Weekly   sodium chloride flush  10-40 mL Intracatheter Q12H   Continuous Infusions:  sodium chloride 10 mL/hr at 10/11/18 1737   sodium chloride Stopped (10/18/18 0000)   dextrose 5 % and 0.45% NaCl 50 mL/hr at 10/21/18 1200   PRN Meds:.sodium chloride, Place/Maintain arterial line **AND** sodium chloride, acetaminophen (TYLENOL) oral liquid 160 mg/5 mL,  albuterol, dextrose, fentaNYL (SUBLIMAZE) injection, midazolam, phenol, sodium chloride flush      Subjective:   Sakia Distel was seen and examined today.  Alert awake and oriented, tolerating diet, feels a lot better.  No fevers.  + Nausea today but no diarrhea.  No fevers.  Per mother at the bedside appetite is improving.  Patient denies dizziness, abdominal pain, chest pain or shortness of breath   Objective:   Vitals:   10/21/18 2053 10/22/18 0523 10/22/18 0841 10/22/18 1456  BP: 121/87 115/80  (!) 114/92  Pulse: 93 93  (!) 104  Resp: 20 18  (!) 24  Temp: 98.5 F (  36.9 C) 98.5 F (36.9 C)  98.8 F (37.1 C)  TempSrc: Oral Oral  Oral  SpO2: 99% 100% 100% 100%  Weight:      Height:        Intake/Output Summary (Last 24 hours) at 10/22/2018 1510 Last data filed at 10/21/2018 2053 Gross per 24 hour  Intake 120 ml  Output 0 ml  Net 120 ml     Wt Readings from Last 3 Encounters:  10/21/18 47.2 kg  10/04/18 49.3 kg  09/24/18 50.3 kg   Physical Exam  General: Alert and oriented x 3, NAD  Eyes:   HEENT:  Atraumatic, normocephalic  Cardiovascular: S1 S2 clear, no murmurs, RRR. No pedal edema b/l  Respiratory: CTAB, no wheezing, rales or rhonchi  Gastrointestinal: Soft, nontender, nondistended, NBS  Ext: no pedal edema bilaterally  Neuro: no new deficits  Musculoskeletal: No cyanosis, clubbing  Skin: No rashes  Psych: Normal affect and demeanor, alert and oriented x3   Data Reviewed:  I have personally reviewed following labs and imaging studies  Micro Results No results found for this or any previous visit (from the past 240 hour(s)).  Radiology Reports Dg Chest 2 View  Result Date: 10/05/2018 CLINICAL DATA:  Suspected sepsis EXAM: CHEST - 2 VIEW COMPARISON:  10/02/2018, 10/01/2018, 09/19/2018, 05/28/2018, 06/03/2017, 03/11/2013, 10/15/2017, 07/12/2018 FINDINGS: Small right-sided pleural effusion. Underlying reticular interstitial changes suggesting  component of chronic disease, however there is interim worsening of bilateral right greater than left airspace disease, suspicious for acute edema or pneumonia superimposed on chronic underlying disease. Stable cardiomediastinal silhouette. No pneumothorax. IMPRESSION: 1. Interval worsening of bilateral right greater than left airspace disease, suspicious for acute edema or pneumonia superimposed on underlying chronic disease. Small right-sided pleural effusion. Electronically Signed   By: Donavan Foil M.D.   On: 10/05/2018 23:52   Dg Abd 1 View  Result Date: 10/15/2018 CLINICAL DATA:  Check feeding tube placement EXAM: ABDOMEN - 1 VIEW COMPARISON:  10/07/2018 FINDINGS: Nasogastric catheter has been removed. A weighted feeding catheter is now seen within the mid stomach. Scattered large and small bowel gas is noted. No bony abnormality is seen. IMPRESSION: Feeding catheter within the mid stomach. Electronically Signed   By: Inez Catalina M.D.   On: 10/15/2018 14:05   Dg Abd 1 View  Result Date: 10/07/2018 CLINICAL DATA:  Abdominal distention. OG tube placement. EXAM: ABDOMEN - 1 VIEW COMPARISON:  CT scan of the abdomen dated 08/02/2018 FINDINGS: There is gaseous distention of the stomach. The tip of the OG tube lies in the distal stomach. No dilated large or small bowel. Hepatosplenomegaly. Infiltrates at the lung bases. Bones are normal. IMPRESSION: OG tube tip lies in the distal stomach. Gaseous distention of the stomach. Electronically Signed   By: Lorriane Shire M.D.   On: 10/07/2018 09:58   Ct Head Wo Contrast  Result Date: 10/07/2018 CLINICAL DATA:  Altered mental status, intubation, HIV EXAM: CT HEAD WITHOUT CONTRAST TECHNIQUE: Contiguous axial images were obtained from the base of the skull through the vertex without intravenous contrast. COMPARISON:  None. FINDINGS: Brain: No evidence of acute infarction, hemorrhage, hydrocephalus, extra-axial collection or mass lesion/mass effect. Vascular: No  hyperdense vessel or unexpected calcification. Skull: Normal. Negative for fracture or focal lesion. Sinuses/Orbits: No acute finding. Other: None. IMPRESSION: No acute intracranial pathology. Electronically Signed   By: Eddie Candle M.D.   On: 10/07/2018 16:28   Ct Chest Wo Contrast  Result Date: 10/16/2018 CLINICAL DATA:  Acute respiratory failure EXAM:  CT CHEST WITHOUT CONTRAST TECHNIQUE: Multidetector CT imaging of the chest was performed following the standard protocol without IV contrast. COMPARISON:  None. FINDINGS: Cardiovascular: Heart is normal size.  Aorta is normal caliber. Mediastinum/Nodes: Enlarged mediastinal lymph nodes. Index right paratracheal node has a short axis diameter of 11 mm. These are stable since prior study. No axillary adenopathy. Difficult to assess the hila without intravenous contrast. Lungs/Pleura: Extensive interstitial thickening, ground-glass opacities. More confluent consolidation seen in the left upper lobe and both lower lobes. Trace bilateral effusions. Upper Abdomen: Imaging into the upper abdomen shows no acute findings. Probable splenomegaly. The spleen is not imaged in its entirety. Musculoskeletal: Chest wall soft tissues are unremarkable. No acute bony abnormality. IMPRESSION: Diffuse ground-glass airspace disease throughout the lungs with interlobular septal thickening. More confluent consolidation in the left upper lobe and both superior segments of the lower lobes. This is similar to prior study and presumably reflects atypical infection there is mediastinal adenopathy which is also stable. Sarcoidosis could have a similar appearance in the appropriate clinical setting. Trace bilateral effusions. Probable splenomegaly. The spleen is not imaged in its entirety but appears enlarged in the visualized upper abdomen. Recommend clinical correlation. Electronically Signed   By: Rolm Baptise M.D.   On: 10/16/2018 19:05   US Renal  Result Date: 10/09/2018 CLINICAL  DATA:  Acute kidney injury. EXAM: RENAL / URINARY TRACT ULTRASOUND COMPLETE COMPARISON:  08/08/2018 FINDINGS: Right Kidney: Renal measurements: 11.2 x 4.4 x 5.8 cm = volume: 149 mL. Echogenicity is diffusely increased. No hydronephrosis. Left Kidney: Renal measurements: 12.4 x 5.5 x 4.6 cm = volume: 163 mL. Diffusely increased echogenicity without hydronephrosis. Bladder: Decompressed by Foley catheter. Note: Small to moderate bilateral pleural effusions noted. IMPRESSION: 1. Bilateral echogenic kidneys without hydronephrosis. Imaging features compatible with medical renal disease. 2. Bilateral pleural effusions. Electronically Signed   By: Misty Stanley M.D.   On: 10/09/2018 20:03   Dg Chest Port 1 View  Result Date: 10/15/2018 CLINICAL DATA:  Hypoxia. EXAM: PORTABLE CHEST 1 VIEW COMPARISON:  Radiograph of October 14, 2018. FINDINGS: The heart size and mediastinal contours are within normal limits. Endotracheal and nasogastric tubes have been removed. Bilateral internal jugular catheters are unchanged in position. Stable bilateral bibasilar opacities are noted concerning for possible atelectasis or inflammation. The visualized skeletal structures are unremarkable. IMPRESSION: Endotracheal and nasogastric tubes have been removed. Stable bilateral lung opacities as described above. Electronically Signed   By: Marijo Conception M.D.   On: 10/15/2018 07:08   Dg Chest Port 1 View  Result Date: 10/14/2018 CLINICAL DATA:  resp failure EXAM: PORTABLE CHEST 1 VIEW COMPARISON:  10/13/2018 FINDINGS: Endotracheal tube is in place, tip 4.8 centimeters above the carina. RIGHT IJ central line tip overlies the superior vena cava. LEFT IJ central line tip overlies the superior vena cava. Patient is rotated towards the LEFT. Nasogastric tube tip is off the image. The heart is normal in size. Diffuse ground-glass opacities are identified throughout the lungs bilaterally without significant change. No new consolidation.  IMPRESSION: Stable bilateral pulmonary infiltrates. Electronically Signed   By: Nolon Nations M.D.   On: 10/14/2018 09:03   Dg Chest Port 1 View  Result Date: 10/13/2018 CLINICAL DATA:  Acute kidney injury secondary to shock and hypoperfusion. Intubated patient. EXAM: PORTABLE CHEST 1 VIEW COMPARISON:  Single-view of the chest 10/12/2018 and 10/10/2018. FINDINGS: Support tubes and lines are unchanged and project in good position. Diffuse hazy bilateral airspace disease has improved. No pneumothorax or pleural  fluid. Heart size is normal. IMPRESSION: No change in support apparatus. Improved bilateral airspace disease. Electronically Signed   By: Inge Rise M.D.   On: 10/13/2018 08:33   Dg Chest Port 1 View  Result Date: 10/12/2018 CLINICAL DATA:  Acute respiratory failure EXAM: PORTABLE CHEST 1 VIEW COMPARISON:  Two days ago FINDINGS: Endotracheal tube tip between the clavicular heads and carina. Bilateral central line in the IJ with tips at the SVC. The orogastric tube reaches the stomach. Diffuse hazy opacification of the lungs but significantly improved aeration from prior. No visible pleural fluid or air leak. IMPRESSION: 1. Unremarkable hardware positioning. 2. Diffuse airspace disease that is improved from 2 days ago. Electronically Signed   By: Monte Fantasia M.D.   On: 10/12/2018 08:16   Dg Chest Port 1 View  Result Date: 10/10/2018 CLINICAL DATA:  ET positioning EXAM: PORTABLE CHEST 1 VIEW COMPARISON:  Radiograph October 08, 2018 FINDINGS: Endotracheal tube is positioned in the mid trachea approximately 4 cm from the carina right IJ catheter terminates at the superior cavoatrial junction. Left IJ dual lumen catheter terminates at the caval-brachiocephalic confluence. Transesophageal tube tip and side port distal to the GE junction. Pacer pad overlies the left chest. Persistent bilateral airspace opacities demonstrate some interval clearing in the upper lobes. Basilar opacities are similar  to prior. Cardiomediastinal silhouette is largely obscured by overlying opacity. No visible pneumothorax or discernible effusion. No acute osseous or soft tissue abnormality. IMPRESSION: 1. Satisfactory positioning of the lines and tubes, as above. 2. Persistent bilateral airspace opacities with some interval clearing in the upper lobes. Electronically Signed   By: Lovena Le M.D.   On: 10/10/2018 05:27   Dg Chest Port 1 View  Result Date: 10/09/2018 CLINICAL DATA:  ARDS EXAM: PORTABLE CHEST 1 VIEW COMPARISON:  Radiograph 10/08/2018 FINDINGS: Pacer pads overlie the chest. Right IJ catheter tip terminates at the level of the superior cavoatrial junction. A left IJ dual lumen catheter tip terminates near the brachiocephalic-caval confluence. Endotracheal tube is positioned in the mid trachea, 3.8 cm from the carina. A transesophageal tube tip and side port are distal to the GE junction. There are diffuse consolidative opacities throughout both lungs on a background of more hazy interstitial opacity. Suspect small right effusion. No pneumothorax. Cardiac silhouette is largely obscured by the overlying opacities. IMPRESSION: Extensive bilateral airspace opacities, similar in extent to most recent comparison. Stable, satisfactory positioning of lines and tubes, as above Electronically Signed   By: Lovena Le M.D.   On: 10/09/2018 05:40   Dg Chest Port 1 View  Result Date: 10/08/2018 CLINICAL DATA:  CVL placement EXAM: PORTABLE CHEST 1 VIEW COMPARISON:  10/08/2018, 10/07/2018, 10/05/2018 FINDINGS: Endotracheal tube tip is about 4.3 cm superior to the carina. Esophageal tube tip below the diaphragm but non included. Right IJ central venous catheter tip over the SVC. Left IJ central venous catheter tip over the upper SVC. No left pneumothorax. Extensive bilateral interstitial and alveolar disease. Obscured cardiomediastinal silhouette. Probable small effusions. IMPRESSION: 1. New left IJ central venous catheter  with tip over the SVC. No pneumothorax. Support lines and tubes otherwise grossly stable in position 2. No significant change in extensive interstitial and alveolar disease bilaterally. Electronically Signed   By: Donavan Foil M.D.   On: 10/08/2018 14:06   Dg Chest Port 1 View  Result Date: 10/08/2018 CLINICAL DATA:  Check endotracheal tube placement EXAM: PORTABLE CHEST 1 VIEW COMPARISON:  10/07/2018 FINDINGS: Cardiac shadow is stable. Right jugular central  line, gastric catheter and endotracheal tube are again seen. The endotracheal tube now lies 0.6 cm above the carina slightly withdrawn when compared with the prior exam. Persistent bilateral airspace opacities are noted worst in the right lung base stable from the previous day. Small right-sided effusion is noted superiorly. Bony abnormality is noted. IMPRESSION: Tubes and lines as described above. Stable airspace opacities bilaterally. Electronically Signed   By: Inez Catalina M.D.   On: 10/08/2018 08:43   Portable Chest X-ray  Result Date: 10/07/2018 CLINICAL DATA:  Intubation EXAM: PORTABLE CHEST 1 VIEW COMPARISON:  October 06, 2018 FINDINGS: ET tube is 3 cm above the level of carina. Enteric tube is seen coursing below the diaphragm. A right-sided PICC is seen at the superior cavoatrial junction. Patchy airspace opacities are seen throughout both lungs, right greater than left. The cardiomediastinal silhouette is unchanged. IMPRESSION: ET tube 3 cm above the carina. Unchanged patchy airspace opacities, right greater than left. Electronically Signed   By: Prudencio Pair M.D.   On: 10/07/2018 10:05   Dg Chest Port 1 View  Result Date: 10/06/2018 CLINICAL DATA:  33 year old female with centralized placement. EXAM: PORTABLE CHEST 1 VIEW COMPARISON:  Chest radiograph dated 10/05/2018 FINDINGS: Right IJ central venous line with tip over the cavoatrial junction. No pneumothorax. Bilateral airspace opacities similar to prior radiograph. There is a small  right pleural effusion. No acute osseous pathology. IMPRESSION: 1. Interval placement of a right IJ central venous line with tip in the region of the cavoatrial junction. No pneumothorax. 2. No significant interval change in the bilateral airspace opacities. Electronically Signed   By: Anner Crete M.D.   On: 10/06/2018 04:04   Dg Chest Port 1 View  Result Date: 10/02/2018 CLINICAL DATA:  Tachypnea EXAM: PORTABLE CHEST 1 VIEW COMPARISON:  Yesterday FINDINGS: Generalized reticular pulmonary opacity, present on multiple prior studies. No pleural fluid or pneumothorax. Normal heart size. IMPRESSION: Chronic reticular pulmonary opacity without acute superimposed finding. Electronically Signed   By: Monte Fantasia M.D.   On: 10/02/2018 05:45   Dg Chest Port 1 View  Result Date: 10/01/2018 CLINICAL DATA:  Cough, pancytopenia EXAM: PORTABLE CHEST 1 VIEW COMPARISON:  09/19/2018 FINDINGS: Stable cardiomediastinal contours. Extensive, diffuse reticulonodular opacities throughout both lungs, markedly progressed from prior. No pleural effusion. No pneumothorax. Osseous structures intact. IMPRESSION: Extensive reticulonodular opacities throughout both lungs suggestive of an atypical infection in an immunocompromised patient. Electronically Signed   By: Davina Poke M.D.   On: 10/01/2018 18:38   Dg Swallowing Func-speech Pathology  Result Date: 10/18/2018 Objective Swallowing Evaluation: Type of Study: MBS-Modified Barium Swallow Study  Patient Details Name: Janelli Welling MRN: [id number] Date of Birth: August 09, 1985 Today's Date: 10/18/2018 Time: SLP Start Time (ACUTE ONLY): 1000 -SLP Stop Time (ACUTE ONLY): 9024 SLP Time Calculation (min) (ACUTE ONLY): 15 min Past Medical History: Past Medical History: Diagnosis Date  Acute hyponatremia 06/03/2017  Anemia   CAP (community acquired pneumonia) 06/03/2017  Facial dermatitis 06/03/2017  HIV (human immunodeficiency virus infection) ([place])   diagnosed in April 2019   Pleurisy 06/03/2017  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 ([place])   Thrush of mouth and esophagus ([place])   UTI (urinary tract infection)  Past Surgical History: Past Surgical History: Procedure Laterality Date  BIOPSY  07/12/2018  Procedure: BIOPSY;  Surgeon: Laurin Coder, MD;  Location: [facility];  Service: Endoscopy;;  BRONCHIAL WASHINGS  07/12/2018  Procedure: BRONCHIAL WASHINGS;  Surgeon: Laurin Coder, MD;  Location: [facility];  Service: Endoscopy;;  ENDOBRONCHIAL ULTRASOUND N/A 07/12/2018  Procedure: ENDOBRONCHIAL ULTRASOUND;  Surgeon: Laurin Coder, MD;  Location: [facility];  Service: Endoscopy;  Laterality: N/A;  FINE NEEDLE ASPIRATION BIOPSY  07/12/2018  Procedure: FINE NEEDLE ASPIRATION BIOPSY;  Surgeon: Laurin Coder, MD;  Location: [facility];  Service: Endoscopy;;  NO PAST SURGERIES    VIDEO BRONCHOSCOPY N/A 07/12/2018  Procedure: VIDEO BRONCHOSCOPY WITHOUT FLUORO;  Surgeon: Laurin Coder, MD;  Location: [facility];  Service: Endoscopy;  Laterality: N/A; HPI: 33 yo F with history of HIV/AIDS admitted on 8/29 with progressive shortness of breath. History of multiple admissions over the last year for waxing / waning pulmonary infiltrates. Patient decompensated on 8/31 requiring initiation of mechanical intubation and later pressure support and CRRT, was extubated 10/14/2018.  Swallow eval ordered.  Pt denies h/o dysphagia.  .Pt was taken off CRRT yesterday.  Today is tachycardic.  She reports she is receiving ice chips and her spouse gave her "lots of them" yesterday.  Pt had a small bore feeding tube for nutrition, however, this was found out thursday 10/17/2018 night. Pt to have MBS today.  Subjective: Pt seen in radiology for MBS. RN in attendance. Assessment / Plan / Recommendation CHL IP CLINICAL IMPRESSIONS 10/18/2018 Clinical Impression Pt presents with essentially normal oropharyngeal swallow function. One episode of very trace flash penetration was  noted with large straw sips, however, penetrate cleared spontaneously, and no other episodes of penetration were noted. Otherwise, normal swallow without oral residue, post-swallow pharyngeal residue, or aspiration noted. Recommend beginning regular texture solids and thin liquids, meds whole with liquid. Safe swallow precautions posted at [facility] and reviewed with pt and her mother. SLP will follow for diet tolerance and education.  SLP Visit Diagnosis Dysphagia, oropharyngeal phase (R13.12)     Impact on safety and function Mild aspiration risk   CHL IP TREATMENT RECOMMENDATION 10/18/2018 Treatment Recommendations Therapy as outlined in treatment plan below   Prognosis 10/15/2018 Prognosis for Safe Diet Advancement Good     CHL IP DIET RECOMMENDATION 10/18/2018 SLP Diet Recommendations Regular solids Liquid Administration via Straw;Cup Medication Administration Whole meds with liquid Compensations Slow rate;Small sips/bites;Minimize environmental distractions Postural Changes Seated upright at 90 degrees   CHL IP OTHER RECOMMENDATIONS 10/18/2018 Recommended Consults -- Oral Care Recommendations Oral care QID Other Recommendations --   CHL IP FOLLOW UP RECOMMENDATIONS 10/18/2018 Follow up Recommendations None   CHL IP FREQUENCY AND DURATION 10/18/2018 Speech Therapy Frequency (ACUTE ONLY) min 1 x/week Treatment Duration 1 week;2 weeks      CHL IP ORAL PHASE 10/18/2018 Oral Phase WFL  CHL IP PHARYNGEAL PHASE 10/18/2018 Pharyngeal Phase Impaired Pharyngeal- Nectar Cup WFL Pharyngeal- Thin Cup Reduced airway/laryngeal closure;Penetration/Aspiration during swallow Pharyngeal Material enters airway, remains ABOVE vocal cords then ejected out;Material does not enter airway Pharyngeal- Puree WFL Pharyngeal- Regular WFL  CHL IP CERVICAL ESOPHAGEAL PHASE 10/18/2018 Cervical Esophageal Phase [facility] B. Quentin [facility], CCC-SLP Speech Language Pathologist [phone] [name] 10/18/2018, 10:40 AM              Vas [place] Lower Extremity  Venous (dvt)  Result Date: 10/07/2018  Lower Venous Study Indications: SOB, and Edema.  Risk Factors: Advanced HIV/AIDS. Limitations: Lights on in room, patient newly vented. Comparison Study: Prior study from 06/20/17 is available for comparison Performing Technologist: Sharion Dove RVS  Examination Guidelines: A complete evaluation includes B-mode imaging, spectral Doppler, color Doppler, and power Doppler as needed of all accessible portions of each vessel. Bilateral testing is  considered an integral part of a complete examination. Limited examinations for reoccurring indications may be performed as noted.  +---------+---------------+---------+-----------+----------+--------------+  RIGHT     Compressibility Phasicity Spontaneity Properties Thrombus Aging  +---------+---------------+---------+-----------+----------+--------------+  CFV       Full            Yes       Yes                                    +---------+---------------+---------+-----------+----------+--------------+  SFJ       Full                                                             +---------+---------------+---------+-----------+----------+--------------+  FV Prox   Full                                                             +---------+---------------+---------+-----------+----------+--------------+  FV Mid    Full                                                             +---------+---------------+---------+-----------+----------+--------------+  FV Distal Full                                                             +---------+---------------+---------+-----------+----------+--------------+  PFV       Full                                                             +---------+---------------+---------+-----------+----------+--------------+  POP       Full            Yes       Yes                                    +---------+---------------+---------+-----------+----------+--------------+  PTV       Full                                                              +---------+---------------+---------+-----------+----------+--------------+  PERO      Full                                                             +---------+---------------+---------+-----------+----------+--------------+   +---------+---------------+---------+-----------+----------+--------------+  LEFT      Compressibility Phasicity Spontaneity Properties Thrombus Aging  +---------+---------------+---------+-----------+----------+--------------+  CFV       Full            Yes       Yes                                    +---------+---------------+---------+-----------+----------+--------------+  SFJ       Full                                                             +---------+---------------+---------+-----------+----------+--------------+  FV Prox   Full                                                             +---------+---------------+---------+-----------+----------+--------------+  FV Mid    Full                                                             +---------+---------------+---------+-----------+----------+--------------+  FV Distal Full                                                             +---------+---------------+---------+-----------+----------+--------------+  PFV       Full                                                             +---------+---------------+---------+-----------+----------+--------------+  POP       Full            Yes       Yes                                    +---------+---------------+---------+-----------+----------+--------------+  PTV       Full                                                             +---------+---------------+---------+-----------+----------+--------------+  PERO  Not visualized  +---------+---------------+---------+-----------+----------+--------------+     Summary: Right: Findings appear essentially unchanged compared to previous  examination. There is no evidence of deep vein thrombosis in the lower extremity. Left: Findings appear essentially unchanged compared to previous examination. There is no evidence of deep vein thrombosis in the lower extremity. However, portions of this examination were limited- see technologist comments above.  *See table(s) above for measurements and observations. Electronically signed by Monica Martinez MD on 10/07/2018 at 6:03:03 PM.    Final    Vas [place] Upper Extremity Venous Duplex  Result Date: 10/15/2018 UPPER VENOUS STUDY  Indications: Edema Limitations: Bandages and Edema and size of the veins. Comparison Study: No previous study available Performing Technologist: Toma Copier RVS  Examination Guidelines: A complete evaluation includes B-mode imaging, spectral Doppler, color Doppler, and power Doppler as needed of all accessible portions of each vessel. Bilateral testing is considered an integral part of a complete examination. Limited examinations for reoccurring indications may be performed as noted.  Right Findings: +----------+------------+---------+-----------+----------+---------------------+  RIGHT      Compressible Phasicity Spontaneous Properties        Summary         +----------+------------+---------+-----------+----------+---------------------+  IJV                                                      Not visualized due to                                                               dialysis access     +----------+------------+---------+-----------+----------+---------------------+  Subclavian     Full        Yes        Yes                                       +----------+------------+---------+-----------+----------+---------------------+  Axillary       Full        Yes        Yes                                       +----------+------------+---------+-----------+----------+---------------------+  Brachial       Full        Yes        Yes                                        +----------+------------+---------+-----------+----------+---------------------+  Radial         Full                                                             +----------+------------+---------+-----------+----------+---------------------+  Ulnar  Full                                                             +----------+------------+---------+-----------+----------+---------------------+  Cephalic                                                 Not visualized due to                                                            edema and size of the                                                                    vein           +----------+------------+---------+-----------+----------+---------------------+  Basilic                                                  Not visualized due to                                                            the size of the vein   +----------+------------+---------+-----------+----------+---------------------+ Technically limited due to dialysis access in the neck, size and edema.  Left Findings: +----------+------------+---------+-----------+----------+---------------------+  LEFT       Compressible Phasicity Spontaneous Properties        Summary         +----------+------------+---------+-----------+----------+---------------------+  IJV                                                      Not visualized due to                                                                IV placement       +----------+------------+---------+-----------+----------+---------------------+  Subclavian  Not visualized due to                                                               bandages for IV                                                                     placement        +----------+------------+---------+-----------+----------+---------------------+  Axillary       Full        Yes        Yes                                        +----------+------------+---------+-----------+----------+---------------------+  Brachial       Full        Yes        Yes                                       +----------+------------+---------+-----------+----------+---------------------+  Radial         Full                                                             +----------+------------+---------+-----------+----------+---------------------+  Ulnar          Full                                                             +----------+------------+---------+-----------+----------+---------------------+  Cephalic                                                 Not visualize due to                                                                size and edema      +----------+------------+---------+-----------+----------+---------------------+  Basilic                                                  Not visualized due  to                                                               size and edema      +----------+------------+---------+-----------+----------+---------------------+ Technically limited due to bandages, IV placement, size. and edema  Summary:  Right: No evidence of deep vein thrombosis in the upper extremity. However, unable to visualize all veins. See comments listed above. No evidence of superficial vein thrombosis . However, unable to visualize See comments listed above. No evidence of thrombosis in the subclavian. This was a limited study.  Left: No evidence of deep vein thrombosis in the upper extremity. However, unable to visualize all veins. See comments listed avone. No evidence of superficial vein thrombosis . However, unable to visualize all veins. See comments listed avone. Unable to visualize the subclavian/ See comments listed abone. This was a limited study.  *See table(s) above for measurements and observations.  Diagnosing physician: Servando Snare MD Electronically signed by Servando Snare MD on 10/15/2018 at 1:15:33 PM.    Final     [place] Ekg Site Rite  Result Date: 10/18/2018 If Site Rite image not attached, placement could not be confirmed due to current cardiac rhythm.   Lab Data:  CBC: Recent Labs  Lab 10/16/18 0725 10/17/18 0423 10/17/18 1500 10/18/18 0425 10/19/18 0430 10/22/18 0436  WBC 16.8* 16.5*  --  13.5* 12.3* 7.9  NEUTROABS 13.4* 13.5*  --   --   --  6.3  HGB 7.8* 6.1* 8.1* 8.1* 8.2* 7.1*  HCT 24.6* 19.3* 25.1* 25.2* 25.4* 22.9*  MCV 96.5 97.5  --  93.0 95.1 98.7  PLT 21* 26*  --  30* 45* 58*   Basic Metabolic Panel: Recent Labs  Lab 10/17/18 0423 10/18/18 0425 10/19/18 0430 10/20/18 0616 10/20/18 0617 10/21/18 0519 10/22/18 0436  NA 138 142 142 142  --  144 141   140  K 4.4 3.7 3.4* 3.9  --  4.0 3.6   3.6  CL 104 106 110 110  --  113* 111   112*  CO2 22 23 20* 20*  --  23 20*   22  GLUCOSE 123* 150* 123* 164*  --  90 82   81  BUN 58* 78* 67* 39*  --  29* 16   16  CREATININE 1.57* 1.52* 1.27* 0.94  --  0.72 0.58   0.58  CALCIUM 7.3* 8.3* 8.1* 8.0*  --  7.8* 7.4*   7.4*  MG 2.0 1.8 1.2*  --  1.2* 1.7  --   PHOS 2.6 3.4 3.2 2.8  --  4.0 3.7   GFR: Estimated Creatinine Clearance: 75.9 mL/min (by C-G formula based on SCr of 0.58 mg/dL). Liver Function Tests: Recent Labs  Lab 10/16/18 0350  10/18/18 0425 10/19/18 0430 10/20/18 0616 10/21/18 0519 10/22/18 0436  AST 58*  --   --   --   --   --   --   ALT 35  --   --   --   --   --   --   ALKPHOS 370*  --   --   --   --   --   --   BILITOT 2.5*  --   --   --   --   --   --  PROT 6.8  --   --   --   --   --   --   ALBUMIN 2.4*   2.5*   < > 2.3* 2.3* 2.4* 2.7* 2.4*   < > = values in this interval not displayed.   No results for input(s): LIPASE, AMYLASE in the last 168 hours. No results for input(s): AMMONIA in the last 168 hours. Coagulation Profile: No results for input(s): INR, PROTIME in the last 168 hours. Cardiac Enzymes: No results for input(s): CKTOTAL, CKMB, CKMBINDEX, TROPONINI in the last 168 hours. BNP (last 3  results) No results for input(s): PROBNP in the last 8760 hours. HbA1C: No results for input(s): HGBA1C in the last 72 hours. CBG: Recent Labs  Lab 10/20/18 0523 10/20/18 0743 10/21/18 0737 10/21/18 0826 10/22/18 0954  GLUCAP 129* 85 59* 79 151*   Lipid Profile: No results for input(s): CHOL, HDL, LDLCALC, TRIG, CHOLHDL, LDLDIRECT in the last 72 hours. Thyroid Function Tests: No results for input(s): TSH, T4TOTAL, FREET4, T3FREE, THYROIDAB in the last 72 hours. Anemia Panel: Recent Labs    10/22/18 0436  FERRITIN 5,272*   Urine analysis:    Component Value Date/Time   COLORURINE AMBER (A) 10/01/2018 1832   APPEARANCEUR HAZY (A) 10/01/2018 1832   LABSPEC 1.006 10/01/2018 1832   PHURINE 5.0 10/01/2018 1832   GLUCOSEU NEGATIVE 10/01/2018 1832   HGBUR NEGATIVE 10/01/2018 1832   BILIRUBINUR NEGATIVE 10/01/2018 [employer] 10/01/2018 [place] NEGATIVE 10/01/2018 1832   UROBILINOGEN 1.0 12/03/2011 0550   NITRITE NEGATIVE 10/01/2018 1832   LEUKOCYTESUR NEGATIVE 10/01/2018 1832     Ranferi Clingan M.D. [facility] 10/22/2018, 3:10 PM  Pager: [phone] Between 7am to 7pm - call Pager - [phone]  After 7pm go to [email or web address] - password [email or web address]  Call night coverage person covering after 7pm

## 2018-10-22 NOTE — Progress Notes (Signed)
  Speech Language Pathology Treatment: Dysphagia  Patient Details Name: Krystal Short MRN: [id number] DOB: 1985/10/24 Today's Date: 10/22/2018 Time: 1810-1830 SLP Time Calculation (min) (ACUTE ONLY): 20 min  Assessment / Plan / Recommendation Clinical Impression  Today pt is demonstrating much improved phonatory ability and no indication of aspiration with po observed. Her voice is improved *whispered voice only on 10/17/2018 but she is demonstrating some phonation at this time.  SLP advised pt and her mother to record pt on their smart phone restating the same phrase approximately once a week or decreased increments if indicated to assess for vocal improvement. If she does not improve, ENT OP referral may be indicated.  Using teach back, pt and her mother effectively understood.  Reviewed MBS with pt and her mother revealing a strong pharyngeal swallow with no residuals.  Oral transiting minimally slow on MBS but suspect is more efficient at this time given ongoing medical improvement.  RN reports good tolerance of po intake.  Observed pt consuming liquids without indication of dysphagia.  Pt and her mother deny pt having any symptoms of dysphagia/coughing associated with po.  Mother reports pt's coughing is improved today compared to yesterday, SLP hopes cough is contributing to pt's ongoing dysphonia and phonation will continue to improve.  Per pt, cough over the last few days has been productive to white secretions.  No SLP follow up indicated at this time.    HPI HPI: 33 yo F with history of HIV/AIDS admitted on 8/29 with progressive shortness of breath. History of multiple admissions over the last year for waxing / waning pulmonary infiltrates. Patient decompensated on 8/31 requiring initiation of mechanical intubation and later pressure support and CRRT, was extubated 10/14/2018.  Swallow eval ordered.  Pt denies h/o dysphagia.  .Pt was taken off CRRT yesterday.  Today is tachycardic.  She reports  she is receiving ice chips and her spouse gave her "lots of them" yesterday.  Pt had a small bore feeding tube for nutrition, however, this was found out thursday 10/17/2018 night. Pt had MBS on 10/18/2018 with functional swallow with diet recommendation of regular/thin.  Today seen to assure po tolerance and for final education.      SLP Plan  All goals met       Recommendations  Medication Administration: Whole meds with liquid Supervision: Patient able to self feed Compensations: Slow rate;Small sips/bites Postural Changes and/or Swallow Maneuvers: Seated upright 90 degrees;Upright 30-60 min after meal                Oral Care Recommendations: Oral care QID Follow up Recommendations: Other (comment)(TBD) Plan: All goals met       GO              Luanna Salk, MS [facility] SLP Acute Rehab Services Pager [phone] Office [phone]   Macario Golds 10/22/2018, 8:32 PM

## 2018-10-22 NOTE — Progress Notes (Addendum)
HEMATOLOGY-ONCOLOGY PROGRESS NOTE  SUBJECTIVE:  Feeling better overall.  Now out of the ICU.  Try to work with physical therapy and gain her strength back.  She has not noted any bleeding.  Afebrile.  REVIEW OF SYSTEMS:   Fatigue grade 1, no acute SOB at rest, no fevers.  I have reviewed the past medical history, past surgical history, social history and family history with the patient and they are unchanged from previous note.   PHYSICAL EXAMINATION:  Vitals:   10/22/18 0523 10/22/18 0841  BP: 115/80   Pulse: 93   Resp: 18   Temp: 98.5 F (36.9 C)   SpO2: 100% 100%   Filed Weights   10/19/18 0400 10/20/18 0423 10/21/18 0500  Weight: 106 lb 0.7 oz (48.1 kg) 112 lb 10.5 oz (51.1 kg) 104 lb 0.9 oz (47.2 kg)  .Temp (48hrs), Avg:98.3 F (36.8 C), Min:97.5 F (36.4 C), Max:98.9 F (37.2 C)    Intake/Output from previous day: 09/14 0701 - 09/15 0700 In: 521.9 [P.O.:360; I.V.:161.9] Out: 250 [Urine:250]  GENERAL: sitting up comfortably in bed, no distress LUNGS: Clear to auscultation  HEART: Tachycardic and trace lower extremity edema ABDOMEN:abdomen soft, non-tender and normal bowel sounds Musculoskeletal:no cyanosis of digits and no clubbing  NEURO: alert oriented x 3  LABORATORY DATA:  I have reviewed the data as listed  . CBC Latest Ref Rng & Units 10/22/2018 10/19/2018 10/18/2018  WBC 4.0 - 10.5 K/uL 7.9 12.3(H) 13.5(H)  Hemoglobin 12.0 - 15.0 g/dL 7.1(L) 8.2(L) 8.1(L)  Hematocrit 36.0 - 46.0 % 22.9(L) 25.4(L) 25.2(L)  Platelets 150 - 400 K/uL 58(L) 45(L) 30(L)    . CMP Latest Ref Rng & Units 10/22/2018 10/22/2018 10/21/2018  Glucose 70 - 99 mg/dL 82 81 90  BUN 6 - 20 mg/dL 16 16 29(H)  Creatinine 0.44 - 1.00 mg/dL 0.58 0.58 0.72  Sodium 135 - 145 mmol/L 141 140 144  Potassium 3.5 - 5.1 mmol/L 3.6 3.6 4.0  Chloride 98 - 111 mmol/L 111 112(H) 113(H)  CO2 22 - 32 mmol/L 20(L) 22 23  Calcium 8.9 - 10.3 mg/dL 7.4(L) 7.4(L) 7.8(L)  Total Protein 6.5 - 8.1 g/dL - -  -  Total Bilirubin 0.3 - 1.2 mg/dL - - -  Alkaline Phos 38 - 126 U/L - - -  AST 15 - 41 U/L - - -  ALT 0 - 44 U/L - - -           Component     Latest Ref Rng & Units 10/07/2018  EBV DNA QN by PCR     Negative copies/mL 1,033  log10 EBV DNA Qn PCR     log10 copy/mL 3.014  CMV Quant DNA PCR (Plasma)     Negative IU/mL 144,000  Log10 CMV Qn DNA Pl     log10 IU/mL 5.158  Interleukin-6, Plasma     0.0 - 12.2 pg/mL 771.6 (H)   [facility] test code [id number]   [facility] test name SARS COV2 AB,IGG   Comment: Performed at [facility], [place] [address] [place], [place] [address]  Misc [facility] result COMMENT   Comment: (NOTE)  Test Ordered: 220254 SARS-CoV-2 Antibody, IgG  SARS-CoV-2 Antibody, IgG    Negative        [facility] test name INTERLEUKIN 2 SOLUBLE RECEPTOR A   Comment: Performed at [facility], [place] [address] [place], [place] [address]  Misc [facility] result COMMENT   Comment: (NOTE)  Test Ordered: [phone] IL-2 Receptor Alpha  IL-2 Receptor Alpha      [id number]    [facility] ] U/mL   BN    Reference Range: [id number]        Dg Chest 2 View  Result Date: 10/05/2018 CLINICAL DATA:  Suspected sepsis EXAM: CHEST - 2 VIEW COMPARISON:  10/02/2018, 10/01/2018, 09/19/2018, 05/28/2018, 06/03/2017, 03/11/2013, 10/15/2017, 07/12/2018 FINDINGS: Small right-sided pleural effusion. Underlying reticular interstitial changes suggesting component of chronic disease, however there is interim worsening of bilateral right greater than left airspace disease, suspicious for acute edema or pneumonia superimposed on chronic underlying disease. Stable cardiomediastinal silhouette. No pneumothorax. IMPRESSION: 1. Interval worsening of bilateral right greater than left airspace disease, suspicious for acute edema or pneumonia superimposed on underlying chronic disease. Small right-sided pleural effusion. Electronically Signed   By: Donavan Foil M.D.   On:  10/05/2018 23:52   Dg Abd 1 View  Result Date: 10/15/2018 CLINICAL DATA:  Check feeding tube placement EXAM: ABDOMEN - 1 VIEW COMPARISON:  10/07/2018 FINDINGS: Nasogastric catheter has been removed. A weighted feeding catheter is now seen within the mid stomach. Scattered large and small bowel gas is noted. No bony abnormality is seen. IMPRESSION: Feeding catheter within the mid stomach. Electronically Signed   By: Inez Catalina M.D.   On: 10/15/2018 14:05   Dg Abd 1 View  Result Date: 10/07/2018 CLINICAL DATA:  Abdominal distention. OG tube placement. EXAM: ABDOMEN - 1 VIEW COMPARISON:  CT scan of the abdomen dated 08/02/2018 FINDINGS: There is gaseous distention of the stomach. The tip of the OG tube lies in the distal stomach. No dilated large or small bowel. Hepatosplenomegaly. Infiltrates at the lung bases. Bones are normal. IMPRESSION: OG tube tip lies in the distal stomach. Gaseous distention of the stomach. Electronically Signed   By: Lorriane Shire M.D.   On: 10/07/2018 09:58   Ct Head Wo Contrast  Result Date: 10/07/2018 CLINICAL DATA:  Altered mental status, intubation, HIV EXAM: CT HEAD WITHOUT CONTRAST TECHNIQUE: Contiguous axial images were obtained from the base of the skull through the vertex without intravenous contrast. COMPARISON:  None. FINDINGS: Brain: No evidence of acute infarction, hemorrhage, hydrocephalus, extra-axial collection or mass lesion/mass effect. Vascular: No hyperdense vessel or unexpected calcification. Skull: Normal. Negative for fracture or focal lesion. Sinuses/Orbits: No acute finding. Other: None. IMPRESSION: No acute intracranial pathology. Electronically Signed   By: Eddie Candle M.D.   On: 10/07/2018 16:28   Ct Chest Wo Contrast  Result Date: 10/16/2018 CLINICAL DATA:  Acute respiratory failure EXAM: CT CHEST WITHOUT CONTRAST TECHNIQUE: Multidetector CT imaging of the chest was performed following the standard protocol without IV contrast. COMPARISON:  None.  FINDINGS: Cardiovascular: Heart is normal size.  Aorta is normal caliber. Mediastinum/Nodes: Enlarged mediastinal lymph nodes. Index right paratracheal node has a short axis diameter of 11 mm. These are stable since prior study. No axillary adenopathy. Difficult to assess the hila without intravenous contrast. Lungs/Pleura: Extensive interstitial thickening, ground-glass opacities. More confluent consolidation seen in the left upper lobe and both lower lobes. Trace bilateral effusions. Upper Abdomen: Imaging into the upper abdomen shows no acute findings. Probable splenomegaly. The spleen is not imaged in its entirety. Musculoskeletal: Chest wall soft tissues are unremarkable. No acute bony abnormality. IMPRESSION: Diffuse ground-glass airspace disease throughout the lungs with interlobular septal thickening. More confluent consolidation in the left upper lobe and both superior segments of the lower lobes. This is similar to prior study and presumably reflects atypical infection there is mediastinal adenopathy which is also stable.  Sarcoidosis could have a similar appearance in the appropriate clinical setting. Trace bilateral effusions. Probable splenomegaly. The spleen is not imaged in its entirety but appears enlarged in the visualized upper abdomen. Recommend clinical correlation. Electronically Signed   By: Rolm Baptise M.D.   On: 10/16/2018 19:05   US Renal  Result Date: 10/09/2018 CLINICAL DATA:  Acute kidney injury. EXAM: RENAL / URINARY TRACT ULTRASOUND COMPLETE COMPARISON:  08/08/2018 FINDINGS: Right Kidney: Renal measurements: 11.2 x 4.4 x 5.8 cm = volume: 149 mL. Echogenicity is diffusely increased. No hydronephrosis. Left Kidney: Renal measurements: 12.4 x 5.5 x 4.6 cm = volume: 163 mL. Diffusely increased echogenicity without hydronephrosis. Bladder: Decompressed by Foley catheter. Note: Small to moderate bilateral pleural effusions noted. IMPRESSION: 1. Bilateral echogenic kidneys without  hydronephrosis. Imaging features compatible with medical renal disease. 2. Bilateral pleural effusions. Electronically Signed   By: Misty Stanley M.D.   On: 10/09/2018 20:03   Dg Chest Port 1 View  Result Date: 10/15/2018 CLINICAL DATA:  Hypoxia. EXAM: PORTABLE CHEST 1 VIEW COMPARISON:  Radiograph of October 14, 2018. FINDINGS: The heart size and mediastinal contours are within normal limits. Endotracheal and nasogastric tubes have been removed. Bilateral internal jugular catheters are unchanged in position. Stable bilateral bibasilar opacities are noted concerning for possible atelectasis or inflammation. The visualized skeletal structures are unremarkable. IMPRESSION: Endotracheal and nasogastric tubes have been removed. Stable bilateral lung opacities as described above. Electronically Signed   By: Marijo Conception M.D.   On: 10/15/2018 07:08   Dg Chest Port 1 View  Result Date: 10/14/2018 CLINICAL DATA:  resp failure EXAM: PORTABLE CHEST 1 VIEW COMPARISON:  10/13/2018 FINDINGS: Endotracheal tube is in place, tip 4.8 centimeters above the carina. RIGHT IJ central line tip overlies the superior vena cava. LEFT IJ central line tip overlies the superior vena cava. Patient is rotated towards the LEFT. Nasogastric tube tip is off the image. The heart is normal in size. Diffuse ground-glass opacities are identified throughout the lungs bilaterally without significant change. No new consolidation. IMPRESSION: Stable bilateral pulmonary infiltrates. Electronically Signed   By: Nolon Nations M.D.   On: 10/14/2018 09:03   Dg Chest Port 1 View  Result Date: 10/13/2018 CLINICAL DATA:  Acute kidney injury secondary to shock and hypoperfusion. Intubated patient. EXAM: PORTABLE CHEST 1 VIEW COMPARISON:  Single-view of the chest 10/12/2018 and 10/10/2018. FINDINGS: Support tubes and lines are unchanged and project in good position. Diffuse hazy bilateral airspace disease has improved. No pneumothorax or pleural  fluid. Heart size is normal. IMPRESSION: No change in support apparatus. Improved bilateral airspace disease. Electronically Signed   By: Inge Rise M.D.   On: 10/13/2018 08:33   Dg Chest Port 1 View  Result Date: 10/12/2018 CLINICAL DATA:  Acute respiratory failure EXAM: PORTABLE CHEST 1 VIEW COMPARISON:  Two days ago FINDINGS: Endotracheal tube tip between the clavicular heads and carina. Bilateral central line in the IJ with tips at the SVC. The orogastric tube reaches the stomach. Diffuse hazy opacification of the lungs but significantly improved aeration from prior. No visible pleural fluid or air leak. IMPRESSION: 1. Unremarkable hardware positioning. 2. Diffuse airspace disease that is improved from 2 days ago. Electronically Signed   By: Monte Fantasia M.D.   On: 10/12/2018 08:16   Dg Chest Port 1 View  Result Date: 10/10/2018 CLINICAL DATA:  ET positioning EXAM: PORTABLE CHEST 1 VIEW COMPARISON:  Radiograph October 08, 2018 FINDINGS: Endotracheal tube is positioned in the mid trachea approximately  4 cm from the carina right IJ catheter terminates at the superior cavoatrial junction. Left IJ dual lumen catheter terminates at the caval-brachiocephalic confluence. Transesophageal tube tip and side port distal to the GE junction. Pacer pad overlies the left chest. Persistent bilateral airspace opacities demonstrate some interval clearing in the upper lobes. Basilar opacities are similar to prior. Cardiomediastinal silhouette is largely obscured by overlying opacity. No visible pneumothorax or discernible effusion. No acute osseous or soft tissue abnormality. IMPRESSION: 1. Satisfactory positioning of the lines and tubes, as above. 2. Persistent bilateral airspace opacities with some interval clearing in the upper lobes. Electronically Signed   By: Lovena Le M.D.   On: 10/10/2018 05:27   Dg Chest Port 1 View  Result Date: 10/09/2018 CLINICAL DATA:  ARDS EXAM: PORTABLE CHEST 1 VIEW COMPARISON:   Radiograph 10/08/2018 FINDINGS: Pacer pads overlie the chest. Right IJ catheter tip terminates at the level of the superior cavoatrial junction. A left IJ dual lumen catheter tip terminates near the brachiocephalic-caval confluence. Endotracheal tube is positioned in the mid trachea, 3.8 cm from the carina. A transesophageal tube tip and side port are distal to the GE junction. There are diffuse consolidative opacities throughout both lungs on a background of more hazy interstitial opacity. Suspect small right effusion. No pneumothorax. Cardiac silhouette is largely obscured by the overlying opacities. IMPRESSION: Extensive bilateral airspace opacities, similar in extent to most recent comparison. Stable, satisfactory positioning of lines and tubes, as above Electronically Signed   By: Lovena Le M.D.   On: 10/09/2018 05:40   Dg Chest Port 1 View  Result Date: 10/08/2018 CLINICAL DATA:  CVL placement EXAM: PORTABLE CHEST 1 VIEW COMPARISON:  10/08/2018, 10/07/2018, 10/05/2018 FINDINGS: Endotracheal tube tip is about 4.3 cm superior to the carina. Esophageal tube tip below the diaphragm but non included. Right IJ central venous catheter tip over the SVC. Left IJ central venous catheter tip over the upper SVC. No left pneumothorax. Extensive bilateral interstitial and alveolar disease. Obscured cardiomediastinal silhouette. Probable small effusions. IMPRESSION: 1. New left IJ central venous catheter with tip over the SVC. No pneumothorax. Support lines and tubes otherwise grossly stable in position 2. No significant change in extensive interstitial and alveolar disease bilaterally. Electronically Signed   By: Donavan Foil M.D.   On: 10/08/2018 14:06   Dg Chest Port 1 View  Result Date: 10/08/2018 CLINICAL DATA:  Check endotracheal tube placement EXAM: PORTABLE CHEST 1 VIEW COMPARISON:  10/07/2018 FINDINGS: Cardiac shadow is stable. Right jugular central line, gastric catheter and endotracheal tube are again  seen. The endotracheal tube now lies 0.6 cm above the carina slightly withdrawn when compared with the prior exam. Persistent bilateral airspace opacities are noted worst in the right lung base stable from the previous day. Small right-sided effusion is noted superiorly. Bony abnormality is noted. IMPRESSION: Tubes and lines as described above. Stable airspace opacities bilaterally. Electronically Signed   By: Inez Catalina M.D.   On: 10/08/2018 08:43   Portable Chest X-ray  Result Date: 10/07/2018 CLINICAL DATA:  Intubation EXAM: PORTABLE CHEST 1 VIEW COMPARISON:  October 06, 2018 FINDINGS: ET tube is 3 cm above the level of carina. Enteric tube is seen coursing below the diaphragm. A right-sided PICC is seen at the superior cavoatrial junction. Patchy airspace opacities are seen throughout both lungs, right greater than left. The cardiomediastinal silhouette is unchanged. IMPRESSION: ET tube 3 cm above the carina. Unchanged patchy airspace opacities, right greater than left. Electronically Signed   By:  Prudencio Pair M.D.   On: 10/07/2018 10:05   Dg Chest Port 1 View  Result Date: 10/06/2018 CLINICAL DATA:  33 year old female with centralized placement. EXAM: PORTABLE CHEST 1 VIEW COMPARISON:  Chest radiograph dated 10/05/2018 FINDINGS: Right IJ central venous line with tip over the cavoatrial junction. No pneumothorax. Bilateral airspace opacities similar to prior radiograph. There is a small right pleural effusion. No acute osseous pathology. IMPRESSION: 1. Interval placement of a right IJ central venous line with tip in the region of the cavoatrial junction. No pneumothorax. 2. No significant interval change in the bilateral airspace opacities. Electronically Signed   By: Anner Crete M.D.   On: 10/06/2018 04:04   Dg Chest Port 1 View  Result Date: 10/02/2018 CLINICAL DATA:  Tachypnea EXAM: PORTABLE CHEST 1 VIEW COMPARISON:  Yesterday FINDINGS: Generalized reticular pulmonary opacity, present on  multiple prior studies. No pleural fluid or pneumothorax. Normal heart size. IMPRESSION: Chronic reticular pulmonary opacity without acute superimposed finding. Electronically Signed   By: Monte Fantasia M.D.   On: 10/02/2018 05:45   Dg Chest Port 1 View  Result Date: 10/01/2018 CLINICAL DATA:  Cough, pancytopenia EXAM: PORTABLE CHEST 1 VIEW COMPARISON:  09/19/2018 FINDINGS: Stable cardiomediastinal contours. Extensive, diffuse reticulonodular opacities throughout both lungs, markedly progressed from prior. No pleural effusion. No pneumothorax. Osseous structures intact. IMPRESSION: Extensive reticulonodular opacities throughout both lungs suggestive of an atypical infection in an immunocompromised patient. Electronically Signed   By: Davina Poke M.D.   On: 10/01/2018 18:38   Dg Swallowing Func-speech Pathology  Result Date: 10/18/2018 Objective Swallowing Evaluation: Type of Study: MBS-Modified Barium Swallow Study  Patient Details Name: Krystal Short MRN: [id number] Date of Birth: [birth year] Today's Date: 10/18/2018 Time: SLP Start Time (ACUTE ONLY): 1000 -SLP Stop Time (ACUTE ONLY): 7939 SLP Time Calculation (min) (ACUTE ONLY): 15 min Past Medical History: Past Medical History: Diagnosis Date  Acute hyponatremia 06/03/2017  Anemia   CAP (community acquired pneumonia) 06/03/2017  Facial dermatitis 06/03/2017  HIV (human immunodeficiency virus infection) ([place])   diagnosed in April 2019  Pleurisy 06/03/2017  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 ([place])   Thrush of mouth and esophagus ([place])   UTI (urinary tract infection)  Past Surgical History: Past Surgical History: Procedure Laterality Date  BIOPSY  07/12/2018  Procedure: BIOPSY;  Surgeon: Laurin Coder, MD;  Location: [facility];  Service: Endoscopy;;  BRONCHIAL WASHINGS  07/12/2018  Procedure: BRONCHIAL WASHINGS;  Surgeon: Laurin Coder, MD;  Location: [facility];  Service: Endoscopy;;  ENDOBRONCHIAL ULTRASOUND N/A  07/12/2018  Procedure: ENDOBRONCHIAL ULTRASOUND;  Surgeon: Laurin Coder, MD;  Location: [facility];  Service: Endoscopy;  Laterality: N/A;  FINE NEEDLE ASPIRATION BIOPSY  07/12/2018  Procedure: FINE NEEDLE ASPIRATION BIOPSY;  Surgeon: Laurin Coder, MD;  Location: [facility];  Service: Endoscopy;;  NO PAST SURGERIES    VIDEO BRONCHOSCOPY N/A 07/12/2018  Procedure: VIDEO BRONCHOSCOPY WITHOUT FLUORO;  Surgeon: Laurin Coder, MD;  Location: [facility];  Service: Endoscopy;  Laterality: N/A; HPI: 33 yo F with history of HIV/AIDS admitted on 8/29 with progressive shortness of breath. History of multiple admissions over the last year for waxing / waning pulmonary infiltrates. Patient decompensated on 8/31 requiring initiation of mechanical intubation and later pressure support and CRRT, was extubated 10/14/2018.  Swallow eval ordered.  Pt denies h/o dysphagia.  .Pt was taken off CRRT yesterday.  Today is tachycardic.  She reports she is receiving ice chips and her spouse gave her "lots of  them" yesterday.  Pt had a small bore feeding tube for nutrition, however, this was found out thursday 10/17/2018 night. Pt to have MBS today.  Subjective: Pt seen in radiology for MBS. RN in attendance. Assessment / Plan / Recommendation CHL IP CLINICAL IMPRESSIONS 10/18/2018 Clinical Impression Pt presents with essentially normal oropharyngeal swallow function. One episode of very trace flash penetration was noted with large straw sips, however, penetrate cleared spontaneously, and no other episodes of penetration were noted. Otherwise, normal swallow without oral residue, post-swallow pharyngeal residue, or aspiration noted. Recommend beginning regular texture solids and thin liquids, meds whole with liquid. Safe swallow precautions posted at [facility] and reviewed with pt and her mother. SLP will follow for diet tolerance and education.  SLP Visit Diagnosis Dysphagia, oropharyngeal phase (R13.12)     Impact on safety and  function Mild aspiration risk   CHL IP TREATMENT RECOMMENDATION 10/18/2018 Treatment Recommendations Therapy as outlined in treatment plan below   Prognosis 10/15/2018 Prognosis for Safe Diet Advancement Good     CHL IP DIET RECOMMENDATION 10/18/2018 SLP Diet Recommendations Regular solids Liquid Administration via Straw;Cup Medication Administration Whole meds with liquid Compensations Slow rate;Small sips/bites;Minimize environmental distractions Postural Changes Seated upright at 90 degrees   CHL IP OTHER RECOMMENDATIONS 10/18/2018 Recommended Consults -- Oral Care Recommendations Oral care QID Other Recommendations --   CHL IP FOLLOW UP RECOMMENDATIONS 10/18/2018 Follow up Recommendations None   CHL IP FREQUENCY AND DURATION 10/18/2018 Speech Therapy Frequency (ACUTE ONLY) min 1 x/week Treatment Duration 1 week;2 weeks      CHL IP ORAL PHASE 10/18/2018 Oral Phase WFL  CHL IP PHARYNGEAL PHASE 10/18/2018 Pharyngeal Phase Impaired Pharyngeal- Nectar Cup WFL Pharyngeal- Thin Cup Reduced airway/laryngeal closure;Penetration/Aspiration during swallow Pharyngeal Material enters airway, remains ABOVE vocal cords then ejected out;Material does not enter airway Pharyngeal- Puree WFL Pharyngeal- Regular WFL  CHL IP CERVICAL ESOPHAGEAL PHASE 10/18/2018 Cervical Esophageal Phase [facility] Celia B. Quentin Ore [facility], CCC-SLP Speech Language Pathologist [phone] [name] 10/18/2018, 10:40 AM              Vas [place] Lower Extremity Venous (dvt)  Result Date: 10/07/2018  Lower Venous Study Indications: SOB, and Edema.  Risk Factors: Advanced HIV/AIDS. Limitations: Lights on in room, patient newly vented. Comparison Study: Prior study from 06/20/17 is available for comparison Performing Technologist: Sharion Dove RVS  Examination Guidelines: A complete evaluation includes B-mode imaging, spectral Doppler, color Doppler, and power Doppler as needed of all accessible portions of each vessel. Bilateral testing is considered an integral part of  a complete examination. Limited examinations for reoccurring indications may be performed as noted.  +---------+---------------+---------+-----------+----------+--------------+  RIGHT     Compressibility Phasicity Spontaneity Properties Thrombus Aging  +---------+---------------+---------+-----------+----------+--------------+  CFV       Full            Yes       Yes                                    +---------+---------------+---------+-----------+----------+--------------+  SFJ       Full                                                             +---------+---------------+---------+-----------+----------+--------------+  FV Prox   Full                                                             +---------+---------------+---------+-----------+----------+--------------+  FV Mid    Full                                                             +---------+---------------+---------+-----------+----------+--------------+  FV Distal Full                                                             +---------+---------------+---------+-----------+----------+--------------+  PFV       Full                                                             +---------+---------------+---------+-----------+----------+--------------+  POP       Full            Yes       Yes                                    +---------+---------------+---------+-----------+----------+--------------+  PTV       Full                                                             +---------+---------------+---------+-----------+----------+--------------+  PERO      Full                                                             +---------+---------------+---------+-----------+----------+--------------+   +---------+---------------+---------+-----------+----------+--------------+  LEFT      Compressibility Phasicity Spontaneity Properties Thrombus Aging  +---------+---------------+---------+-----------+----------+--------------+  CFV       Full             Yes       Yes                                    +---------+---------------+---------+-----------+----------+--------------+  SFJ       Full                                                             +---------+---------------+---------+-----------+----------+--------------+  FV Prox   Full                                                             +---------+---------------+---------+-----------+----------+--------------+  FV Mid    Full                                                             +---------+---------------+---------+-----------+----------+--------------+  FV Distal Full                                                             +---------+---------------+---------+-----------+----------+--------------+  PFV       Full                                                             +---------+---------------+---------+-----------+----------+--------------+  POP       Full            Yes       Yes                                    +---------+---------------+---------+-----------+----------+--------------+  PTV       Full                                                             +---------+---------------+---------+-----------+----------+--------------+  PERO                                                       Not visualized  +---------+---------------+---------+-----------+----------+--------------+     Summary: Right: Findings appear essentially unchanged compared to previous examination. There is no evidence of deep vein thrombosis in the lower extremity. Left: Findings appear essentially unchanged compared to previous examination. There is no evidence of deep vein thrombosis in the lower extremity. However, portions of this examination were limited- see technologist comments above.  *See table(s) above for measurements and observations. Electronically signed by Monica Martinez MD on 10/07/2018 at 6:03:03 PM.    Final    Vas [place] Upper Extremity Venous Duplex  Result Date:  10/15/2018 UPPER VENOUS STUDY  Indications: Edema Limitations: Bandages and Edema and size of the veins. Comparison Study: No previous study available Performing Technologist: Toma Copier RVS  Examination Guidelines: A complete evaluation includes B-mode imaging, spectral Doppler, color Doppler, and power Doppler  as needed of all accessible portions of each vessel. Bilateral testing is considered an integral part of a complete examination. Limited examinations for reoccurring indications may be performed as noted.  Right Findings: +----------+------------+---------+-----------+----------+---------------------+  RIGHT      Compressible Phasicity Spontaneous Properties        Summary         +----------+------------+---------+-----------+----------+---------------------+  IJV                                                      Not visualized due to                                                               dialysis access     +----------+------------+---------+-----------+----------+---------------------+  Subclavian     Full        Yes        Yes                                       +----------+------------+---------+-----------+----------+---------------------+  Axillary       Full        Yes        Yes                                       +----------+------------+---------+-----------+----------+---------------------+  Brachial       Full        Yes        Yes                                       +----------+------------+---------+-----------+----------+---------------------+  Radial         Full                                                             +----------+------------+---------+-----------+----------+---------------------+  Ulnar          Full                                                             +----------+------------+---------+-----------+----------+---------------------+  Cephalic                                                 Not visualized due to  edema and size of the                                                                    vein           +----------+------------+---------+-----------+----------+---------------------+  Basilic                                                  Not visualized due to                                                            the size of the vein   +----------+------------+---------+-----------+----------+---------------------+ Technically limited due to dialysis access in the neck, size and edema.  Left Findings: +----------+------------+---------+-----------+----------+---------------------+  LEFT       Compressible Phasicity Spontaneous Properties        Summary         +----------+------------+---------+-----------+----------+---------------------+  IJV                                                      Not visualized due to                                                                IV placement       +----------+------------+---------+-----------+----------+---------------------+  Subclavian                                               Not visualized due to                                                               bandages for IV                                                                     placement        +----------+------------+---------+-----------+----------+---------------------+  Axillary       Full        Yes  Yes                                       +----------+------------+---------+-----------+----------+---------------------+  Brachial       Full        Yes        Yes                                       +----------+------------+---------+-----------+----------+---------------------+  Radial         Full                                                             +----------+------------+---------+-----------+----------+---------------------+  Ulnar          Full                                                              +----------+------------+---------+-----------+----------+---------------------+  Cephalic                                                 Not visualize due to                                                                size and edema      +----------+------------+---------+-----------+----------+---------------------+  Basilic                                                  Not visualized due to                                                               size and edema      +----------+------------+---------+-----------+----------+---------------------+ Technically limited due to bandages, IV placement, size. and edema  Summary:  Right: No evidence of deep vein thrombosis in the upper extremity. However, unable to visualize all veins. See comments listed above. No evidence of superficial vein thrombosis . However, unable to visualize See comments listed above. No evidence of thrombosis in the subclavian. This was a limited study.  Left: No evidence of deep vein thrombosis in the upper extremity. However, unable to visualize all veins. See comments listed avone. No evidence of superficial vein thrombosis . However, unable to visualize all veins. See comments  listed avone. Unable to visualize the subclavian/ See comments listed abone. This was a limited study.  *See table(s) above for measurements and observations.  Diagnosing physician: Servando Snare MD Electronically signed by Servando Snare MD on 10/15/2018 at 1:15:33 PM.    Final    [place] Ekg Site Rite  Result Date: 10/18/2018 If Site Rite image not attached, placement could not be confirmed due to current cardiac rhythm.  09/11/2018 Bone Marrow Biopsy      09/11/2018 [facility]   ASSESSMENT AND PLAN:  This is a 33 year old unfortunate African-American female with a very complex medical history and course with HIV AIDS CD4 count less than 50 on ART with recurrent admissions for sepsis and possible PJP with  1. Anemia-transfusion dependent 2.  Thrombocytopenia-significant thrombocytopenia currently transfusion dependent 3.  Leukocytosis-with elevated procalcitonin levels and significantly elevated interleukin-6 level suggesting severe inflammation. 4.  PJP recently, recurrent sepsis, etiology of pulmonary infiltrates unclear 5.  HIV/AIDS on Biktarvy. On Atovaquone for PCP prophylaxis and Azithromycin for MAI prophylaxis. 6. Splenomegaly and some lymphadenopathy noted on previous imaging  7.  Acute respiratory failure currently intubated and on a mechanical ventilator. 8. Concern for secondary [place]  Patient with pancytopenia with extensive workup including 2 BM Bx's . Patients blood counts havent had a chance for sustained improvement on account of recurrent sepsis, medication causing BM suppression and cytopenia, HIV/AIDS related dysplastic changes on last BM Bx. She has had improvement in her condition with previous antibiotic treatments and brief use of steroids and PJP treatment. Bone marrow examination has not shown overt infectious etiology, was negative for MAI or other fungal processes. No overt evidence of lymphoma in the bone marrow biopsy. Lymphadenopathy and splenomegaly could be from her HIV AIDS as well as recurrent infections. Currently cultures remain negative and patient is on empiric treatment for healthcare acquired pneumonia.  Significantly elevated interleukin-6 levels suggest severe inflammatory state.  This could be from recurrent infections or macrophage activation syndrome related to HIV. Also has elevated ferritin level in the setting of significant transfusion history and from acute inflammation. Triglyceride levels have fluctuated the last ones are elevated. LDH levels are elevated but this could be in the setting of alternative pulmonary process. She has had very poor nutritional status on account of recurrent admissions sepsis AIDS and other considerations.  This can also play into her bone marrow  suppression.  She has had increasing blood CMV titers which infectious disease is aware of but do not feel this is the primary driver of her lung findings or bone marrow suppression.  They have also sent out parvovirus testing to evaluate for this is a sign of bone marrow suppression.  Patient did not respond to IVIG previously to suggest immune cytopenias.  Ferritin and C-reactive protein are declining.  Plan -Patient continues to have clinical improvement -Renal function has normalized.  Off CRRT. -Her platelets continue to improve and are up to 58,000 today. -Continue on weekly Nplate at 5 mcg/kg to help boost her platelets to reduce platelet transfusion needs and to potentially provide some safety margin for the possible use of Etoposide. -Continue dexamethasone 12 mg daily and then will continue to taper -continue to recommend transfer to tertiary care facility for mx of secondary [place] and other complex cares. -If patient/her family refuses transfer or transfer not possible/accepted by outside facility will need to continue treating for secondary [place]. -CT chest on 9/9- sill with significant infiltrates -- still unclear etiology. There was consider of previous lung biopsy  with improving platelets would recommend hospital co-ordinate pulmonary input on this ? Need for lung biopsy. Also would need need rpt CMV titers to monitor this in the setting of high dose steroids. Would need ID input on this prior to start Etoposide and will need continued ID monitoring of CMV titer in the setting of steroids and chemotherapy to determine need for presumptive treatment of CMV since this could flare and cause symptomatic disease with high dose steroids and Etoposide and subsequent cyclosporine as a part of secondayr [place] treatment.  -We will continue to follow -Appreciate assistance from other specialties and hospitalist services.  Mikey Bussing, DNP, AGPCNP-BC, AOCNP   Addendum  .Patient was  Personally and independently interviewed, examined and relevant elements of the history of present illness were reviewed in details and an assessment and plan was created. All elements of the patient's history of present illness , assessment and plan were discussed in details with Mikey Bussing, DNP. The above documentation reflects our combined findings assessment and plan.  Was seen with her mother at bedside the evening of 10/22/2018.  She notes she is feeling much better has been eating very well.  Breathing as well as off oxygen.  Notes that she feels much better.  Voice is still a little hoarse.  Platelets continue to improve and are up to 58k She notes that the hospitalist is planning to discharge her tomorrow i.e. 10/23/2018. She will get her weekly dose of Nplate later today or tomorrow as scheduled. We will refer [facility] to [facility] for second opinion/transfer of care for continued management of secondary [place] as outpatient We will set her up for outpatient follow-up in 1 week with labs and for continued weekly Nplate and continued steroid taper as outpatient Would discharge on dexamethasone 12 mg daily for 1 week then 10 mg daily for 1 week.  We shall decide further taper as outpatient. Consideration of etoposide as outpatient. Appreciate excellent hospitalist cares. Would recommend transfusion to hemoglobin of more than 8 prior to discharge to reduce risk of re-hospitalization prior to clinic followup  Sullivan Lone MD MS

## 2018-10-22 NOTE — Progress Notes (Addendum)
OT Cancellation Note  Patient Details Name: Krystal Short MRN: [id number] DOB: 04/07/1985   Cancelled Treatment:    Reason Eval/Treat Not Completed: Other (comment); pt currently with increased nausea, provided ginger ale and pt mother in with pt. Will follow up for OT eval as schedule permits.  Lou Cal, OT [facility] Pager [phone] Office [phone]   Raymondo Band 10/22/2018, 10:42 AM

## 2018-10-23 LAB — BASIC METABOLIC PANEL
Anion gap: 5 (ref 5–15)
BUN: 10 mg/dL (ref 6–20)
CO2: 20 mmol/L — ABNORMAL LOW (ref 22–32)
Calcium: 7.1 mg/dL — ABNORMAL LOW (ref 8.9–10.3)
Chloride: 110 mmol/L (ref 98–111)
Creatinine, Ser: 0.64 mg/dL (ref 0.44–1.00)
GFR calc Af Amer: >60 mL/min (ref >60)
GFR calc non Af Amer: >60 mL/min (ref >60)
Glucose, Bld: 85 mg/dL (ref 70–99)
Potassium: 3.3 mmol/L — ABNORMAL LOW (ref 3.5–5.1)
Sodium: 135 mmol/L (ref 135–145)

## 2018-10-23 LAB — CBC
HCT: 22.5 % — ABNORMAL LOW (ref 36.0–46.0)
Hemoglobin: 7.1 g/dL — ABNORMAL LOW (ref 12.0–15.0)
MCH: 31.1 pg (ref 26.0–34.0)
MCHC: 31.6 g/dL (ref 30.0–36.0)
MCV: 98.7 fL (ref 80.0–100.0)
Platelets: 56 10*3/uL — ABNORMAL LOW (ref 150–400)
RBC: 2.28 MIL/uL — ABNORMAL LOW (ref 3.87–5.11)
RDW: 19.9 % — ABNORMAL HIGH (ref 11.5–15.5)
WBC: 7.5 10*3/uL (ref 4.0–10.5)
nRBC: 0.9 % — ABNORMAL HIGH (ref 0.0–0.2)

## 2018-10-23 LAB — GLUCOSE, CAPILLARY: Glucose-Capillary: 94 mg/dL (ref 70–99)

## 2018-10-23 LAB — RENAL FUNCTION PANEL
Albumin: 2.4 g/dL — ABNORMAL LOW (ref 3.5–5.0)
Anion gap: 7 (ref 5–15)
BUN: 10 mg/dL (ref 6–20)
CO2: 19 mmol/L — ABNORMAL LOW (ref 22–32)
Calcium: 6.9 mg/dL — ABNORMAL LOW (ref 8.9–10.3)
Chloride: 110 mmol/L (ref 98–111)
Creatinine, Ser: 0.54 mg/dL (ref 0.44–1.00)
GFR calc Af Amer: >60 mL/min (ref >60)
GFR calc non Af Amer: >60 mL/min (ref >60)
Glucose, Bld: 82 mg/dL (ref 70–99)
Phosphorus: 2.8 mg/dL (ref 2.5–4.6)
Potassium: 3.2 mmol/L — ABNORMAL LOW (ref 3.5–5.1)
Sodium: 136 mmol/L (ref 135–145)

## 2018-10-23 LAB — PREPARE RBC (CROSSMATCH)

## 2018-10-23 MED ORDER — SODIUM CHLORIDE 0.9% IV SOLUTION
Freq: Once | INTRAVENOUS | Status: AC
  Administered 2018-10-23: 13:00:00 via INTRAVENOUS

## 2018-10-23 MED ORDER — MAGNESIUM OXIDE 400 (241.3 MG) MG PO TABS: 400.0000 mg | ORAL_TABLET | Freq: Two times a day (BID) | ORAL | 0 refills | Status: AC

## 2018-10-23 MED ORDER — DEXAMETHASONE 6 MG PO TABS: 12.0000 mg | ORAL_TABLET | Freq: Every day | ORAL | 0 refills | Status: DC

## 2018-10-23 MED ORDER — POTASSIUM CHLORIDE CRYS ER 20 MEQ PO TBCR: 20.0000 meq | EXTENDED_RELEASE_TABLET | Freq: Every day | ORAL | 0 refills | Status: AC

## 2018-10-23 MED ORDER — POTASSIUM CHLORIDE CRYS ER 20 MEQ PO TBCR
40.0000 meq | EXTENDED_RELEASE_TABLET | Freq: Once | ORAL | Status: AC
  Administered 2018-10-23: 40 meq via ORAL
  Filled 2018-10-23: qty 2

## 2018-10-23 MED ORDER — LIDOCAINE 5 % EX PTCH: 1.0000 | MEDICATED_PATCH | CUTANEOUS | 0 refills | Status: DC

## 2018-10-23 MED ORDER — CHLORHEXIDINE GLUCONATE 0.12 % MT SOLN: 15.0000 mL | Freq: Two times a day (BID) | OROMUCOSAL | 0 refills | Status: AC

## 2018-10-23 MED ORDER — PANTOPRAZOLE SODIUM 40 MG PO TBEC: 40.0000 mg | DELAYED_RELEASE_TABLET | Freq: Every day | ORAL | 1 refills | Status: AC

## 2018-10-23 MED ORDER — ENSURE ENLIVE PO LIQD: 237.0000 mL | Freq: Two times a day (BID) | ORAL | 12 refills | Status: AC

## 2018-10-23 MED ORDER — ATOVAQUONE 750 MG/5ML PO SUSP: 1500.0000 mg | Freq: Every day | ORAL | 2 refills | Status: AC

## 2018-10-23 NOTE — Discharge Summary (Signed)
Physician Discharge Summary  Krystal Short ZJI:[id number] DOB: 02/01/1986 DOA: 10/05/2018  PCP: Lajean Manes, MD  Admit date: 10/05/2018 Discharge date: 10/23/2018  Admitted From: Home  Discharge disposition: Home with home health   Recommendations for Outpatient Follow-Up:   Follow up with your primary care provider in one week.  Follow-up with your infectious disease doctor as has been scheduled by you.  Follow-up with Dr. Irene Limbo, hemato-oncology in the clinic in 1 week (office to set up appointment).  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  Diet recommendation:, heart healthy.    Wound care: None.  Code status: Full.   History of Present Illness:   Patient is a 33 year old female history of HIV/AIDS, chronic anemia/thrombocytopenia requiring multiple transfusions in the past, who was admitted on 10/05/2018 with progressive shortness of breath and acute recurrent hypoxemic respiratory failure. Patient noted have been treated for PGP pneumonia, bacterial pneumonia and with steroids in the past. Patient noted to have some bone marrow suppression as well as platelets of just 9 which trended down from 16 early on in the month. Patient underwent a bone marrow biopsy, BAL. Patient decompensated during the  hospitalization and on 10/07/2018 required initiation of mechanical ventilation with pressure support requiring pressors and CRRT. Patient noted to be having metabolic acidosis, acute kidney injury and subsequently anuric. Nephrology and ID consulted. Patient extubated 10/14/2018 and patient transferred to hospitalist service.   Recent Evaluation  03/08/18-03/10/18 Acute respiratory failure secondary to pneumonia (rhinovirus). tx with rocephin/ zithromax, Bactrim for PCP. Macrocytic anemia   04/22/2018- Flu A infection. Treated with tamiflu as outpatient  05/28/18- 05/31/18 admitted with respiratory failure, lung infiltrates. Treated with Bactrim, prednisone with improvement  06/14/2018-07/16/2018 Bordetella bronchoseptica infection. Underwent FOB 07/12/2018. PJP test not done as BAL was lost.Biopsy scant lung parenchyma with fibrosis and reactive changes. Required 4 PRBC and 3 platelet infusion.  07/31/2018-08/22/2018 sepsis from suspected PJP pneumonia. Treated with steroids clindamycin and primaquine. Required 8 PRBC and 7 platelet transfusion. RVP positive for routine coronavirus.  09/03/2018-09/13/2018 generalized weakness and deconditioning. Required 3 PRBC 8 platelet transfusion.  7/30/2020IVIGwithout any improvement in platelet count. Given prolonged prednisone taper.  09/11/18 Bone marrowBx all micro studies neg   10/01/2018-10/04/2018 presented with anemia and poor IV access. Given 2 PRBC 1 platelet transfusion. Required pressors. Treated with ceftriaxone and doxy for ? CAP.    Hospital Course:  Following conditions were addressed during hospitalization,  Acute respiratory failure, recurrent with hypoxemia, recurrent bilateral pulmonary infiltrates -Unclear etiology, felt secondary to acute lung injury, possibly hypersensitivity pneumonitis.  -Extubated on 9/7, BAL done, positive for staph epidermidis, PGP negative -Blood cultures negative so far, patient received Bactrim, IV  cefepime 8/29> 9/5 -Per ID recommendations,changed to Zithromax -Prior to discharge, O2 sat stable, 100% on room air.  Patient will follow-up with pulmonary as outpatient.  Acute kidney injury, metabolic  acidosis, hyperkalemia, hypocalcemia -Felt secondary to ATN versus cortical necrosis from septic shock, was anuric -Nephrology was consulted, was placed on CRRT on 9/1, then was subsequently discontinued.  Acute kidney injury has resolved at this time.  Hypomagnesemia Replenished in the hospital  Hypophosphatemia Improved,  Persistent profound thrombocytopenia, anemia -Patient noted to have had extensive work-up by heme-onc with bone marrow biopsy with normal bone marrow on 8/5.  HIT antibody negative, MAHA negative -Patient was initially started on steroids for possible sarcoidosis but no granulomas noted on prior biopsies, tapered off.  Per ID, pulmonology, possibly could be secondary [place] from HIV or viral infections, has been ruled out several times for COVID. Status post 6 units platelets, 3 units packed RBC transfusion during hospitalization. Hematology, Dr. Irene Limbo followed during hospitalization. Patient was placed on Decadron 20 mg daily, followed by taper. Spoke with Dr Irene Limbo, hemato-oncology and recommended Decadron 12 mg for the next 1 week and subsequently will be on 10 mg for the next week.  This will be managed by oncology after 1 week.  HIV/AIDS On Biktarvy, continue atovaquone for PCP prophylaxis, Zithromax for MAI prophylaxis  Acute metabolic encephalopathy In the setting of advanced HIV/AIDS, respiratory failure, acute kidney injury, profound anemia, thrombocytopenia -CT head negative, improved and the patient is at baseline at this time.  Moderate protein calorie malnutrition - on regular diet -SLP evaluation done on 9/11, feeding tube was out on 9/10  Elevated LFTs Improved  Severe generalized debility: -PT OT evaluation was done.  Home PT on  discharge  Disposition.  At this time, patient is stable for disposition home.  Patient will have to follow-up with primary care physician, pulmonary physician and oncology as outpatient.  She does have an appointment with infectious disease that she will need to follow-up with. Spoke with the patient's husband and updated him about the clinical condition and plan for disposition home.  Patient's husband is aware of all the appointments and the need for follow-up.   Medical Consultants:   PCCM Infectious disease Hematooncology Nephrology  Subjective:   Today, patient feels okay.  Wishes to go home.  Denies any shortness of breath, nausea, vomiting or abdominal pain.  Seen by oncology as well.  Discharge Exam:   Vitals:   10/23/18 0818 10/23/18 0953  BP:  114/86  Pulse:  99  Resp:  16  Temp:  98.7 F (37.1 C)  SpO2: 99% 100%   Vitals:   10/22/18 2113 10/23/18 0432 10/23/18 0818 10/23/18 0953  BP: 122/81 (!) 119/91  114/86  Pulse: 82 92  99  Resp: _0 Temp: 98.4 F (36.9 C) 98.2 F (36.8 C)  98.7 F (37.1 C)  TempSrc: Oral Oral    SpO2: 100% 100% 99% 100%  Weight:      Height:       Body mass index is 19.66 kg/m.  General exam: Appears calm and comfortable ,Not in distress, thinly built HEENT:PERRL,Oral mucosa moist, hoarse voice Respiratory system: Bilateral equal air entry, normal vesicular breath sounds, no wheezes or crackles  Cardiovascular system: S1 & S2 heard, RRR.  Gastrointestinal system: Abdomen is nondistended, soft and nontender. No organomegaly or masses felt. Normal bowel sounds heard. Central nervous system: Alert and oriented. No focal neurological deficits. Extremities: No edema, no clubbing ,no cyanosis, distal peripheral pulses palpable. Skin: No rashes, lesions or ulcers,no icterus ,no pallor MSK: Normal muscle bulk,tone ,power    Procedures:    Intubation, extubation CRRT  The results of  significant diagnostics from this  hospitalization (including imaging, microbiology, ancillary and laboratory) are listed below for reference.     Diagnostic Studies:   Dg Chest 2 View  Result Date: 10/05/2018 CLINICAL DATA:  Suspected sepsis EXAM: CHEST - 2 VIEW COMPARISON:  10/02/2018, 10/01/2018, 09/19/2018, 05/28/2018, 06/03/2017, 03/11/2013, 10/15/2017, 07/12/2018 FINDINGS: Small right-sided pleural effusion. Underlying reticular interstitial changes suggesting component of chronic disease, however there is interim worsening of bilateral right greater than left airspace disease, suspicious for acute edema or pneumonia superimposed on chronic underlying disease. Stable cardiomediastinal silhouette. No pneumothorax. IMPRESSION: 1. Interval worsening of bilateral right greater than left airspace disease, suspicious for acute edema or pneumonia superimposed on underlying chronic disease. Small right-sided pleural effusion. Electronically Signed   By: Donavan Foil M.D.   On: 10/05/2018 23:52   Dg Chest Port 1 View  Result Date: 10/06/2018 CLINICAL DATA:  33 year old female with centralized placement. EXAM: PORTABLE CHEST 1 VIEW COMPARISON:  Chest radiograph dated 10/05/2018 FINDINGS: Right IJ central venous line with tip over the cavoatrial junction. No pneumothorax. Bilateral airspace opacities similar to prior radiograph. There is a small right pleural effusion. No acute osseous pathology. IMPRESSION: 1. Interval placement of a right IJ central venous line with tip in the region of the cavoatrial junction. No pneumothorax. 2. No significant interval change in the bilateral airspace opacities. Electronically Signed   By: Anner Crete M.D.   On: 10/06/2018 04:04     Labs:   Basic Metabolic Panel: Recent Labs  Lab 10/17/18 0423 10/18/18 0425 10/19/18 0430 10/20/18 2725 10/20/18 0617 10/21/18 0519 10/22/18 0436 10/23/18 0509  NA 138 142 142 142  --  144 141   140 136   135  K 4.4 3.7 3.4* 3.9  --  4.0 3.6   3.6 3.2*    3.3*  CL 104 106 110 110  --  113* 111   112* 110   110  CO2 22 23 20* 20*  --  23 20*   22 19*   20*  GLUCOSE 123* 150* 123* 164*  --  90 82   81 82   85  BUN 58* 78* 67* 39*  --  29* _0 CREATININE 1.57* 1.52* 1.27* 0.94  --  0.72 0.58   0.58 0.54   0.64  CALCIUM 7.3* 8.3* 8.1* 8.0*  --  7.8* 7.4*   7.4* 6.9*   7.1*  MG 2.0 1.8 1.2*  --  1.2* 1.7  --   --   PHOS 2.6 3.4 3.2 2.8  --  4.0 3.7 2.8   GFR Estimated Creatinine Clearance: 75.9 mL/min (by C-G formula based on SCr of 0.54 mg/dL). Liver Function Tests: Recent Labs  Lab 10/19/18 0430 10/20/18 0616 10/21/18 0519 10/22/18 0436 10/23/18 0509  ALBUMIN 2.3* 2.4* 2.7* 2.4* 2.4*   No results for input(s): LIPASE, AMYLASE in the last 168 hours. No results for input(s): AMMONIA in the last 168 hours. Coagulation profile No results for input(s): INR, PROTIME in the last 168 hours.  CBC: Recent Labs  Lab 10/17/18 0423 10/17/18 1500 10/18/18 0425 10/19/18 0430 10/22/18 0436 10/23/18 0509  WBC 16.5*  --  13.5* 12.3* 7.9 7.5  NEUTROABS 13.5*  --   --   --  6.3  --   HGB 6.1* 8.1* 8.1* 8.2* 7.1* 7.1*  HCT 19.3* 25.1* 25.2* 25.4* 22.9* 22.5*  MCV 97.5  --  93.0 95.1 98.7 98.7  PLT 26*  --  30* 45* 58* 56*   Cardiac Enzymes: No results for input(s): CKTOTAL, CKMB, CKMBINDEX, TROPONINI in the last 168 hours. BNP: Invalid input(s): POCBNP CBG: Recent Labs  Lab 10/20/18 0743 10/21/18 0737 10/21/18 0826 10/22/18 0954 10/23/18 0743  GLUCAP 85 59* 79 151* 94   D-Dimer No results for input(s): DDIMER in the last 72 hours. Hgb A1c No results for input(s): HGBA1C in the last 72 hours. Lipid Profile No results for input(s): CHOL, HDL, LDLCALC, TRIG, CHOLHDL, LDLDIRECT in the last 72 hours. Thyroid function studies No results for input(s): TSH, T4TOTAL, T3FREE, THYROIDAB in the last 72 hours.  Invalid input(s): FREET3 Anemia work up Recent Labs    10/22/18 [place]*   Microbiology No  results found for this or any previous visit (from the past 240 hour(s)).   Discharge Instructions:   Discharge Instructions    Call MD for:  persistant nausea and vomiting   Complete by: As directed    Call MD for:  severe uncontrolled pain   Complete by: As directed    Call MD for:  temperature >100.4   Complete by: As directed    Diet general   Complete by: As directed    Discharge instructions   Complete by: As directed    Follow-up with Dr. Irene Limbo hemato-oncology within 1 week to discuss about further treatment of blood issues.  Follow-up with your infectious disease which has been scheduled for Tuesday.  Follow-up with your primary care physician within 1 week.  Continue to take medications as prescribed including Decadron.  If you have worsening symptoms or witness any bleeding please seek medical attention.   Increase activity slowly   Complete by: As directed      Allergies as of 10/23/2018      Reactions   Heparin Other (See Comments)   Unknown reaction      Medication List    STOP taking these medications   cefdinir 300 MG capsule Commonly known as: OMNICEF   doxycycline 100 MG tablet Commonly known as: VIBRA-TABS   predniSONE 10 MG tablet Commonly known as: DELTASONE     TAKE these medications   albuterol 108 (90 Base) MCG/ACT inhaler Commonly known as: VENTOLIN HFA Inhale 2 puffs into the lungs every 6 (six) hours as needed for wheezing or shortness of breath.   atovaquone 750 MG/5ML suspension Commonly known as: MEPRON Take 10 mLs (1,500 mg total) by mouth daily.   azithromycin 600 MG tablet Commonly known as: ZITHROMAX Take 2 tablets (1,200 mg total) by mouth once a week. What changed: when to take this   benzonatate 100 MG capsule Commonly known as: TESSALON Take 1 capsule (100 mg total) by mouth 3 (three) times daily.   Biktarvy 50-200-25 MG Tabs tablet Generic drug: bictegravir-emtricitabine-tenofovir AF Take 1 tablet by mouth daily.    chlorhexidine 0.12 % solution Commonly known as: PERIDEX 15 mLs by Mouth Rinse route 2 (two) times daily.   dexamethasone 6 MG tablet Commonly known as: Decadron Take 2 tablets (12 mg total) by mouth daily for 7 days.   dextromethorphan-guaiFENesin 30-600 MG 12hr tablet Commonly known as: MUCINEX DM Take 1 tablet by mouth 2 (two) times daily.   feeding supplement (ENSURE ENLIVE) Liqd Take 237 mLs by mouth 2 (two) times daily between meals.   lidocaine 5 % Commonly known as: LIDODERM Place 1 patch onto the skin daily. Remove & Discard patch within 12 hours or as directed by MD   magnesium oxide 400 (  241.3 Mg) MG tablet Commonly known as: MAG-OX Take 1 tablet (400 mg total) by mouth 2 (two) times daily.   pantoprazole 40 MG tablet Commonly known as: Protonix Take 1 tablet (40 mg total) by mouth daily.   potassium chloride SA 20 MEQ tablet Commonly known as: K-DUR Take 1 tablet (20 mEq total) by mouth daily.      Follow-up Information    Lauraine Rinne, NP Follow up on 10/31/2018.   Specialty: Pulmonary Disease Why: 10/31/2018 at 1100 Contact information: [place] [address] [phone]        Marshell Garfinkel, MD Follow up on 11/19/2018.   Specialty: Pulmonary Disease Why: 11/19/2018 at 1130 Contact information: [place] [address] [phone]           Time coordinating discharge: 39 minutes  Signed:  Eryka Dolinger  [facility] 10/23/2018, 10:16 AM

## 2018-10-23 NOTE — Plan of Care (Signed)
Stable for dc home.

## 2018-10-23 NOTE — Evaluation (Signed)
Occupational Therapy Evaluation Patient Details Name: Krystal Short MRN: [id number] DOB: [birth year] Today's Date: 10/23/2018    History of Present Illness 33 year old female history of HIV/AIDS, chronic anemia/thrombocytopenia requiring multiple transfusions in the past, who was admitted on 10/05/2018 with progressive shortness of breath and acute recurrent hypoxemic respiratory failure. Pt on ventilator 10/07/18-10/14/18. Dx of metabolic acidosis, AKI, acute metabolic encephalopathy, thrombyocytopenia   Clinical Impression   Pt was admitted for the above and she had complex course including vent and CRRT. Plan is for home today with 24/7. Would recommend HHOT to follow her at home to restore her previous independent level.    Follow Up Recommendations  Supervision/Assistance - 24 hour;HHOT   Equipment Recommendations  None recommended by OT(has 3:1)    Recommendations for Other Services       Precautions / Restrictions Precautions Precautions: Fall Restrictions Weight Bearing Restrictions: No      Mobility Bed Mobility Overal bed mobility: Modified Independent                Transfers   Equipment used: Rolling walker (2 wheeled)   Sit to Stand: Min guard         General transfer comment: Min guard for safety.    Balance                                           ADL either performed or assessed with clinical judgement   ADL Overall ADL's : Needs assistance/impaired                                       General ADL Comments: ambulated to bathroom with min guard, and min guard and set up for ADLs; pt donned socks.  She has a picc line, so assist only to manage this for dressing     Vision         Perception     Praxis      Pertinent Vitals/Pain Pain Assessment: No/denies pain     Hand Dominance     Extremity/Trunk Assessment Upper Extremity Assessment Upper Extremity Assessment: Generalized weakness(grossly 3+  to 4-/5)           Communication Communication Communication: No difficulties   Cognition Arousal/Alertness: Awake/alert Behavior During Therapy: WFL for tasks assessed/performed Overall Cognitive Status: Within Functional Limits for tasks assessed                                     General Comments       Exercises     Shoulder Instructions      Home Living Family/patient expects to be discharged to:: Private residence Living Arrangements: Spouse/significant other;Parent Available Help at Discharge: Family;Available 24 hours/day               Bathroom Shower/Tub: Teacher, early years/pre: Standard     Home Equipment: Bedside commode          Prior Functioning/Environment Level of Independence: Independent        Comments: most of the time; spouse occasionallly helps with adls.  Pt states he, mother or brother will be with her at all times upon d/c        OT Problem List:  OT Treatment/Interventions:      OT Goals(Current goals can be found in the care plan section) Acute Rehab OT Goals Patient Stated Goal: to get stronger, walk, play with the kids OT Goal Formulation: All assessment and education complete, DC therapy  OT Frequency:     Barriers to D/C:            Co-evaluation              AM-PAC OT "6 Clicks" Daily Activity     Outcome Measure Help from another person eating meals?: None Help from another person taking care of personal grooming?: A Little Help from another person toileting, which includes using toliet, bedpan, or urinal?: A Little Help from another person bathing (including washing, rinsing, drying)?: A Little Help from another person to put on and taking off regular upper body clothing?: A Little Help from another person to put on and taking off regular lower body clothing?: A Little 6 Click Score: 19   End of Session    Activity Tolerance: Patient tolerated treatment well Patient  left: in bed;with call bell/phone within reach  OT Visit Diagnosis: Unsteadiness on feet (R26.81)                Time: [id number] OT Time Calculation (min): 20 min Charges:  OT General Charges $OT Visit: 1 Visit OT Evaluation $OT Eval Low Complexity: 1 Low  Lesle Chris, OTR/L Acute Rehabilitation Services [phone] [facility] pager [phone] office 10/23/2018  [place] 10/23/2018, 10:27 AM

## 2018-10-23 NOTE — Discharge Instructions (Signed)
Acute Respiratory Failure, Adult ° °Acute respiratory failure occurs when there is not enough oxygen passing from your lungs to your body. When this happens, your lungs have trouble removing carbon dioxide from the blood. This causes your blood oxygen level to drop too low as carbon dioxide builds up. °Acute respiratory failure is a medical emergency. It can develop quickly, but it is temporary if treated promptly. Your lung capacity, or how much air your lungs can hold, may improve with time, exercise, and treatment. °What are the causes? °There are many possible causes of acute respiratory failure, including: °· Lung injury. °· Chest injury or damage to the ribs or tissues near the lungs. °· Lung conditions that affect the flow of air and blood into and out of the lungs, such as pneumonia, acute respiratory distress syndrome, and cystic fibrosis. °· Medical conditions, such as strokes or spinal cord injuries, that affect the muscles and nerves that control breathing. °· Blood infection (sepsis). °· Inflammation of the pancreas (pancreatitis). °· A blood clot in the lungs (pulmonary embolism). °· A large-volume blood transfusion. °· Burns. °· Near-drowning. °· Seizure. °· Smoke inhalation. °· Reaction to medicines. °· Alcohol or drug overdose. °What increases the risk? °This condition is more likely to develop in people who have: °· A blocked airway. °· Asthma. °· A condition or disease that damages or weakens the muscles, nerves, bones, or tissues that are involved in breathing. °· A serious infection. °· A health problem that blocks the unconscious reflex that is involved in breathing, such as hypothyroidism or sleep apnea. °· A lung injury or trauma. °What are the signs or symptoms? °Trouble breathing is the main symptom of acute respiratory failure. Symptoms may also include: °· Rapid breathing. °· Restlessness or anxiety. °· Skin, lips, or fingernails that appear blue (cyanosis). °· Rapid heart  rate. °· Abnormal heart rhythms (arrhythmias). °· Confusion or changes in behavior. °· Tiredness or loss of energy. °· Feeling sleepy or having a loss of consciousness. °How is this diagnosed? °Your health care provider can diagnose acute respiratory failure with a medical history and physical exam. During the exam, your health care provider will listen to your heart and check for crackling or wheezing sounds in your lungs. Your may also have tests to confirm the diagnosis and determine what is causing respiratory failure. These tests may include: °· Measuring the amount of oxygen in your blood (pulse oximetry). The measurement comes from a small device that is placed on your finger, earlobe, or toe. °· Other blood tests to measure blood gases and to look for signs of infection. °· Sampling your cerebral spinal fluid or tracheal fluid to check for infections. °· Chest X-ray to look for fluid in spaces that should be filled with air. °· Electrocardiogram (ECG) to look at the heart's electrical activity. °How is this treated? °Treatment for this condition usually takes places in a hospital intensive care unit (ICU). Treatment depends on what is causing the condition. It may include one or more treatments until your symptoms improve. Treatment may include: °· Supplemental oxygen. Extra oxygen is given through a tube in the nose, a face mask, or a hood. °· A device such as a continuous positive airway pressure (CPAP) or bi-level positive airway pressure (BiPAP or BPAP) machine. This treatment uses mild air pressure to keep the airways open. A mask or other device will be placed over your nose or mouth. A tube that is connected to a motor will deliver oxygen through the   mask. °· Ventilator. This treatment helps move air into and out of the lungs. This may be done with a bag and mask or a machine. For this treatment, a tube is placed in your windpipe (trachea) so air and oxygen can flow to the lungs. °· Extracorporeal  membrane oxygenation (ECMO). This treatment temporarily takes over the function of the heart and lungs, supplying oxygen and removing carbon dioxide. ECMO gives the lungs a chance to recover. It may be used if a ventilator is not effective. °· Tracheostomy. This is a procedure that creates a hole in the neck to insert a breathing tube. °· Receiving fluids and medicines. °· Rocking the bed to help breathing. °Follow these instructions at home: °· Take over-the-counter and prescription medicines only as told by your health care provider. °· Return to normal activities as told by your health care provider. Ask your health care provider what activities are safe for you. °· Keep all follow-up visits as told by your health care provider. This is important. °How is this prevented? °Treating infections and medical conditions that may lead to acute respiratory failure can help prevent the condition from developing. °Contact a health care provider if: °· You have a fever. °· Your symptoms do not improve or they get worse. °Get help right away if: °· You are having trouble breathing. °· You lose consciousness. °· Your have cyanosis or turn blue. °· You develop a rapid heart rate. °· You are confused. °These symptoms may represent a serious problem that is an emergency. Do not wait to see if the symptoms will go away. Get medical help right away. Call your local emergency services (911 in the [place] Do not drive yourself to the hospital. °This information is not intended to replace advice given to you by your health care provider. Make sure you discuss any questions you have with your health care provider. °Document Released: 01/28/2013 Document Revised: 01/05/2017 Document Reviewed: 08/11/2015 °Elsevier Patient Education © 2020 [employer] ° °

## 2018-10-23 NOTE — Progress Notes (Signed)
Nutrition Follow-up  INTERVENTION:   -Ensure Enlive po BID, each supplement provides 350 kcal and 20 grams of protein -d/c Prostat  NUTRITION DIAGNOSIS:   Inadequate oral intake related to inability to eat as evidenced by NPO status.  Now on regular diet.  GOAL:   Patient will meet greater than or equal to 90% of their needs  Progressing.  MONITOR:   PO intake, Supplement acceptance, Labs, Weight trends, I & O's   ASSESSMENT:   33 y.o. female with medical history significant for advanced HIV, normocytic anemia, thrombocytopenia, and recent admission for sepsis d/t suspected pneumocystis pneumonia. She presented to the ED from the [facility] on 8/25 for evaluation of symptomatic anemia, hypotension, and SOB. She reported on and off symptoms of lightheadedness, generalized weakness, SOB, nonproductive cough over the last 3-4 days. She reported a single episode of fever with diaphoresis. She denied any chest pain, N/V, abdominal pain, diarrhea, or dysuria. She went to the [facility] for routine Nplate injection and transfusion of RBCs. She was therefore sent to the ED for possible need of central line and blood transfusion.  Significant Events: 8/29- admission 8/31- intubation and OGT placement 9/1- HD cath placement; CRRT initiation 9/7- extubation, OGT removal 9/8- small bore NGT placement 9/10 -small bore tube removed, CRRT off 9/11- MBS, diet advanced to regular 9/12 -HD cath removed  **RD working remotely**  Patient now tolerating regular diet. Currently eating with improved appetite. Pt is drinking Ensure and Prostat supplements. Per chart review, pt may be discharging today.   Admission weight: 122 lbs. Current weight: 104 lbs. I/Os: -3.9L since 9/2  Medications: MAG-OX tablet BID Labs reviewed: CBGS: 94-151 Low K Mg/Phos WNL  Diet Order:   Diet Order            Diet general        Diet regular Room service appropriate? Yes; Fluid consistency: Thin   Diet effective now              EDUCATION NEEDS:   No education needs have been identified at this time  Skin:  Skin Assessment: Reviewed RN Assessment  Last BM:  9/14  Height:   Ht Readings from Last 1 Encounters:  10/06/18 5\' 1"  (1.549 m)    Weight:   Wt Readings from Last 1 Encounters:  10/21/18 47.2 kg    Ideal Body Weight:  47.7 kg  BMI:  Body mass index is 19.66 kg/m.  Estimated Nutritional Needs:   Kcal:  1400-1600  Protein:  70-80g  Fluid:  1.6L/day  Clayton Bibles, MS, RD, LDN Inpatient Clinical Dietitian Pager: [phone] After Hours Pager: [phone]

## 2018-10-24 ENCOUNTER — Telehealth: Payer: Self-pay | Admitting: *Deleted

## 2018-10-24 ENCOUNTER — Telehealth: Payer: Self-pay | Admitting: Hematology

## 2018-10-24 LAB — TYPE AND SCREEN
ABO/RH(D): O POS
Antibody Screen: NEGATIVE
Unit division: 0

## 2018-10-24 LAB — BPAM RBC
Blood Product Expiration Date: 202010162359
ISSUE DATE / TIME: 202009161339
Unit Type and Rh: 5100

## 2018-10-24 NOTE — Telephone Encounter (Signed)
Scheduled appt per 9/16 sch message - unable to reach pt . Left message with apt date and time

## 2018-10-24 NOTE — Telephone Encounter (Signed)
At Dr.Kale's request, contacted [facility] [phone] to refer patient for evaluation for secondary [place]. Spoke with Amy in referrals. Patient contact information given.  Amy requested recent notes and labs. Request for records to be faxed to [facility] sent  to [facility] [place] fax # [phone].

## 2018-10-25 ENCOUNTER — Telehealth: Payer: Self-pay | Admitting: *Deleted

## 2018-10-25 ENCOUNTER — Other Ambulatory Visit: Payer: Self-pay | Admitting: *Deleted

## 2018-10-25 LAB — ACID FAST CULTURE WITH REFLEXED SENSITIVITIES (MYCOBACTERIA): Acid Fast Culture: NEGATIVE

## 2018-10-25 NOTE — Patient Outreach (Signed)
[facility]) Care Management  10/25/2018  Krystal Short [birth year] [id number]  Transition of care call  Referral received: 10/02/18 Initial outreach: 10/25/18 Insurance: [employer] Plan  Subjective: This RNCM very familiar with this patient due to frequent hospitalizations. Reached Krystal Short via mobile number but her voice was very hoarse and raspy due to prolonged intubation during her hospitalization. She said her Mom was with her, that she has all of her discharge medications and follow up appointments arranged. This RNCM requested permission to call her on 10/28/18 to complete the  transition of care assessment and she agreed.   Objective: Krystal Short was hospitalized at [facility] from 8/29-9/16 for pneumonia (pulmonary secretions positive for staph epidermidis) with acute respiratory failure and ARDS requiring intubation from 8/31- 9/7. She underwent bronchoscopy on 8/31. She required hemodialysis catheter insertion on 10/08/18 due to acute renal failure and need for short term dialysis. This was Krystal Short's 7th inpatient hospitalization in 2020 with the most recent one 8/25-8/28. Comorbidities include:HIV/AIDS- initially diagnosed in April 2019, anemia, hilar adenopathy, molluscum contagiosum and thrombocytopenia  Plan: Will call Krystal Short on 9/21 to complete transition of care assessment.  Barrington Ellison RN,CCM,CDE [place] Management Coordinator Office Phone [phone] Office Fax [phone]

## 2018-10-25 NOTE — Telephone Encounter (Signed)
Records faxed for referral to [facility] - Release [id number]

## 2018-10-28 ENCOUNTER — Encounter: Payer: Self-pay | Admitting: *Deleted

## 2018-10-28 ENCOUNTER — Other Ambulatory Visit: Payer: Self-pay | Admitting: *Deleted

## 2018-10-28 NOTE — Patient Outreach (Addendum)
[facility]) Care Management  10/28/2018  Krystal Short September 06, 1985 [id number]  Transition of care call/case closure  Referral received: 10/02/18 Initial outreach: 10/25/18 Insurance: [employer] Choice Plan   Subjective: Follow up telephone call to patient's home/mobile number in order to complete transition of care assessment; 2 HIPAA identifiers verified. Explained purpose of call and completed transition of care assessment.  [name] states she her voice remains very raspy, but says her throat is not sore. She says  she is using her albuterol rescue inhaler anytime she feels short of breath, states she used it one time yesterday. She says she is drinking Glucerna as a nutritional supplement instead of Ensure.    Objective: Krystal Short was hospitalized at [facility] from 8/29-9/16 for pneumonia (pulmonary secretions positive for staph epidermidis) with acute respiratory failure and ARDS requiring intubation from 8/31- 9/7. She underwent bronchoscopy on 8/31. She required hemodialysis catheter insertion on 10/08/18 due to acute renal failure and need for short term dialysis. This was Krystal Short's 7th inpatient hospitalization in 2020 with the most recent prior to this one from 8/25-8/28. Comorbidities include:HIV/AIDS- initially diagnosed in April 2019, anemia, hilar adenopathy, molluscum contagiosum and thrombocytopenia  Assessment:  Patient voices good understanding of all discharge instructions.  See transition of care flowsheet for assessment details.   Plan:  Reviewed hospital discharge diagnosis of bilateral pneumonia and treatment plan using hospital discharge instructions, assessing medication adherence, reviewing problems requiring provider notification, and discussing the importance of follow up with primary care provider and specialists as directed. Advised Krystal Short that this RNCM has free Glucerna samples that can be delivered to her specialist office by this RNCM on  11/05/18; [name] agreed and voiced appreciation.  No ongoing care management needs identified so will close case to [place] Management services. Per patient request asuccessful outreach letterwill not be mailed to her home address since she received one in April and one in June. She verifies that she has this RNCM's contact information should future care management needs arise.  Barrington Ellison RN,CCM,CDE [place] Management Coordinator Office Phone [phone] Office Fax [phone]

## 2018-10-29 ENCOUNTER — Emergency Department (HOSPITAL_COMMUNITY): Payer: 59

## 2018-10-29 ENCOUNTER — Encounter (HOSPITAL_COMMUNITY): Payer: Self-pay | Admitting: Emergency Medicine

## 2018-10-29 ENCOUNTER — Other Ambulatory Visit: Payer: Self-pay

## 2018-10-29 ENCOUNTER — Inpatient Hospital Stay (HOSPITAL_COMMUNITY)
Admission: EM | Admit: 2018-10-29 | Discharge: 2018-11-02 | DRG: 974 | Disposition: A | Discharge status: Other Acute Inpatient Hospital | Payer: 59 | Attending: Pulmonary Disease | Admitting: Pulmonary Disease

## 2018-10-29 DIAGNOSIS — D696 Thrombocytopenia, unspecified: Secondary | ICD-10-CM | POA: Diagnosis not present

## 2018-10-29 DIAGNOSIS — D6959 Other secondary thrombocytopenia: Secondary | ICD-10-CM | POA: Diagnosis present

## 2018-10-29 DIAGNOSIS — Z825 Family history of asthma and other chronic lower respiratory diseases: Secondary | ICD-10-CM

## 2018-10-29 DIAGNOSIS — N186 End stage renal disease: Secondary | ICD-10-CM | POA: Diagnosis not present

## 2018-10-29 DIAGNOSIS — I959 Hypotension, unspecified: Secondary | ICD-10-CM | POA: Diagnosis not present

## 2018-10-29 DIAGNOSIS — R6520 Severe sepsis without septic shock: Secondary | ICD-10-CM | POA: Diagnosis not present

## 2018-10-29 DIAGNOSIS — J984 Other disorders of lung: Secondary | ICD-10-CM | POA: Diagnosis present

## 2018-10-29 DIAGNOSIS — Z87891 Personal history of nicotine dependence: Secondary | ICD-10-CM

## 2018-10-29 DIAGNOSIS — R161 Splenomegaly, not elsewhere classified: Secondary | ICD-10-CM | POA: Diagnosis not present

## 2018-10-29 DIAGNOSIS — J150 Pneumonia due to Klebsiella pneumoniae: Secondary | ICD-10-CM | POA: Diagnosis not present

## 2018-10-29 DIAGNOSIS — A419 Sepsis, unspecified organism: Principal | ICD-10-CM | POA: Diagnosis present

## 2018-10-29 DIAGNOSIS — J189 Pneumonia, unspecified organism: Secondary | ICD-10-CM | POA: Diagnosis not present

## 2018-10-29 DIAGNOSIS — R590 Localized enlarged lymph nodes: Secondary | ICD-10-CM | POA: Diagnosis not present

## 2018-10-29 DIAGNOSIS — B20 Human immunodeficiency virus [HIV] disease: Secondary | ICD-10-CM | POA: Diagnosis not present

## 2018-10-29 DIAGNOSIS — D761 Hemophagocytic lymphohistiocytosis: Secondary | ICD-10-CM | POA: Diagnosis not present

## 2018-10-29 DIAGNOSIS — Z79899 Other long term (current) drug therapy: Secondary | ICD-10-CM | POA: Diagnosis not present

## 2018-10-29 DIAGNOSIS — E872 Acidosis: Secondary | ICD-10-CM | POA: Diagnosis not present

## 2018-10-29 DIAGNOSIS — Z20828 Contact with and (suspected) exposure to other viral communicable diseases: Secondary | ICD-10-CM | POA: Diagnosis present

## 2018-10-29 DIAGNOSIS — R509 Fever, unspecified: Secondary | ICD-10-CM

## 2018-10-29 DIAGNOSIS — K828 Other specified diseases of gallbladder: Secondary | ICD-10-CM | POA: Diagnosis not present

## 2018-10-29 DIAGNOSIS — N179 Acute kidney failure, unspecified: Secondary | ICD-10-CM | POA: Diagnosis not present

## 2018-10-29 DIAGNOSIS — R17 Unspecified jaundice: Secondary | ICD-10-CM | POA: Diagnosis present

## 2018-10-29 DIAGNOSIS — Z888 Allergy status to other drugs, medicaments and biological substances status: Secondary | ICD-10-CM

## 2018-10-29 DIAGNOSIS — B259 Cytomegaloviral disease, unspecified: Secondary | ICD-10-CM | POA: Diagnosis not present

## 2018-10-29 DIAGNOSIS — J9601 Acute respiratory failure with hypoxia: Secondary | ICD-10-CM

## 2018-10-29 DIAGNOSIS — D638 Anemia in other chronic diseases classified elsewhere: Secondary | ICD-10-CM | POA: Diagnosis not present

## 2018-10-29 DIAGNOSIS — R162 Hepatomegaly with splenomegaly, not elsewhere classified: Secondary | ICD-10-CM | POA: Diagnosis not present

## 2018-10-29 DIAGNOSIS — R6521 Severe sepsis with septic shock: Secondary | ICD-10-CM | POA: Diagnosis not present

## 2018-10-29 DIAGNOSIS — D649 Anemia, unspecified: Secondary | ICD-10-CM | POA: Diagnosis not present

## 2018-10-29 DIAGNOSIS — R918 Other nonspecific abnormal finding of lung field: Secondary | ICD-10-CM | POA: Diagnosis not present

## 2018-10-29 DIAGNOSIS — Z8701 Personal history of pneumonia (recurrent): Secondary | ICD-10-CM | POA: Diagnosis not present

## 2018-10-29 DIAGNOSIS — R55 Syncope and collapse: Secondary | ICD-10-CM | POA: Diagnosis not present

## 2018-10-29 DIAGNOSIS — R000 Tachycardia, unspecified: Secondary | ICD-10-CM | POA: Diagnosis not present

## 2018-10-29 DIAGNOSIS — A4159 Other Gram-negative sepsis: Secondary | ICD-10-CM | POA: Diagnosis not present

## 2018-10-29 DIAGNOSIS — R531 Weakness: Secondary | ICD-10-CM | POA: Diagnosis not present

## 2018-10-29 DIAGNOSIS — Z209 Contact with and (suspected) exposure to unspecified communicable disease: Secondary | ICD-10-CM | POA: Diagnosis not present

## 2018-10-29 DIAGNOSIS — R0902 Hypoxemia: Secondary | ICD-10-CM | POA: Diagnosis not present

## 2018-10-29 DIAGNOSIS — J9690 Respiratory failure, unspecified, unspecified whether with hypoxia or hypercapnia: Secondary | ICD-10-CM | POA: Diagnosis not present

## 2018-10-29 DIAGNOSIS — Z9289 Personal history of other medical treatment: Secondary | ICD-10-CM

## 2018-10-29 DIAGNOSIS — N170 Acute kidney failure with tubular necrosis: Secondary | ICD-10-CM | POA: Diagnosis not present

## 2018-10-29 DIAGNOSIS — I5031 Acute diastolic (congestive) heart failure: Secondary | ICD-10-CM | POA: Diagnosis not present

## 2018-10-29 DIAGNOSIS — T80218A Other infection due to central venous catheter, initial encounter: Secondary | ICD-10-CM | POA: Diagnosis not present

## 2018-10-29 DIAGNOSIS — R0689 Other abnormalities of breathing: Secondary | ICD-10-CM | POA: Diagnosis not present

## 2018-10-29 DIAGNOSIS — R0600 Dyspnea, unspecified: Secondary | ICD-10-CM | POA: Diagnosis not present

## 2018-10-29 DIAGNOSIS — A689 Relapsing fever, unspecified: Secondary | ICD-10-CM | POA: Diagnosis not present

## 2018-10-29 DIAGNOSIS — K529 Noninfective gastroenteritis and colitis, unspecified: Secondary | ICD-10-CM | POA: Diagnosis not present

## 2018-10-29 DIAGNOSIS — Z4682 Encounter for fitting and adjustment of non-vascular catheter: Secondary | ICD-10-CM | POA: Diagnosis not present

## 2018-10-29 LAB — CBC WITH DIFFERENTIAL/PLATELET
Abs Immature Granulocytes: 0 10*3/uL (ref 0.00–0.07)
Basophils Absolute: 0 10*3/uL (ref 0.0–0.1)
Basophils Relative: 0 %
Eosinophils Absolute: 0 10*3/uL (ref 0.0–0.5)
Eosinophils Relative: 0 %
HCT: 24.9 % — ABNORMAL LOW (ref 36.0–46.0)
Hemoglobin: 8.1 g/dL — ABNORMAL LOW (ref 12.0–15.0)
Immature Granulocytes: 0 %
Lymphocytes Relative: 5 %
Lymphs Abs: 0.5 10*3/uL — ABNORMAL LOW (ref 0.7–4.0)
MCH: 31.4 pg (ref 26.0–34.0)
MCHC: 32.5 g/dL (ref 30.0–36.0)
MCV: 96.5 fL (ref 80.0–100.0)
Monocytes Absolute: 0.3 10*3/uL (ref 0.1–1.0)
Monocytes Relative: 3 %
Neutro Abs: 8.6 10*3/uL — ABNORMAL HIGH (ref 1.7–7.7)
Neutrophils Relative %: 92 %
Platelets: 52 10*3/uL — ABNORMAL LOW (ref 150–400)
RBC: 2.58 MIL/uL — ABNORMAL LOW (ref 3.87–5.11)
RDW: 23.2 % — ABNORMAL HIGH (ref 11.5–15.5)
WBC: 9.4 10*3/uL (ref 4.0–10.5)
nRBC: 0 /100 WBC
nRBC: 0.2 % (ref 0.0–0.2)

## 2018-10-29 LAB — COMPREHENSIVE METABOLIC PANEL
ALT: 33 U/L (ref 0–44)
AST: 63 U/L — ABNORMAL HIGH (ref 15–41)
Albumin: 2.4 g/dL — ABNORMAL LOW (ref 3.5–5.0)
Alkaline Phosphatase: 390 U/L — ABNORMAL HIGH (ref 38–126)
Anion gap: 13 (ref 5–15)
BUN: 18 mg/dL (ref 6–20)
CO2: 16 mmol/L — ABNORMAL LOW (ref 22–32)
Calcium: 8.5 mg/dL — ABNORMAL LOW (ref 8.9–10.3)
Chloride: 104 mmol/L (ref 98–111)
Creatinine, Ser: 1.07 mg/dL — ABNORMAL HIGH (ref 0.44–1.00)
GFR calc Af Amer: >60 mL/min (ref >60)
GFR calc non Af Amer: >60 mL/min (ref >60)
Glucose, Bld: 119 mg/dL — ABNORMAL HIGH (ref 70–99)
Potassium: 4.8 mmol/L (ref 3.5–5.1)
Sodium: 133 mmol/L — ABNORMAL LOW (ref 135–145)
Total Bilirubin: 4.6 mg/dL — ABNORMAL HIGH (ref 0.3–1.2)
Total Protein: 6.4 g/dL — ABNORMAL LOW (ref 6.5–8.1)

## 2018-10-29 LAB — RESPIRATORY PANEL BY PCR

## 2018-10-29 LAB — ABO/RH: ABO/RH(D): O POS

## 2018-10-29 LAB — LACTIC ACID, PLASMA
Lactic Acid, Venous: 6.4 mmol/L (ref 0.5–1.9)
Lactic Acid, Venous: 2.2 mmol/L (ref 0.5–1.9)

## 2018-10-29 LAB — PROTIME-INR
INR: 1.5 — ABNORMAL HIGH (ref 0.8–1.2)
Prothrombin Time: 18.2 seconds — ABNORMAL HIGH (ref 11.4–15.2)

## 2018-10-29 LAB — CORTISOL: Cortisol, Plasma: 11.3 ug/dL

## 2018-10-29 LAB — SARS CORONAVIRUS 2 BY RT PCR (HOSPITAL ORDER, PERFORMED IN ~~LOC~~ HOSPITAL LAB): SARS Coronavirus 2: NEGATIVE

## 2018-10-29 LAB — APTT: aPTT: 24 seconds (ref 24–36)

## 2018-10-29 MED ORDER — SODIUM CHLORIDE 0.9 % IV BOLUS (SEPSIS)
500.0000 mL | Freq: Once | INTRAVENOUS | Status: AC
  Administered 2018-10-29: 500 mL via INTRAVENOUS

## 2018-10-29 MED ORDER — VANCOMYCIN HCL IN DEXTROSE 1-5 GM/200ML-% IV SOLN: 1000.0000 mg | Freq: Once | INTRAVENOUS | Status: DC

## 2018-10-29 MED ORDER — SODIUM CHLORIDE 0.9 % IV BOLUS
1000.0000 mL | Freq: Once | INTRAVENOUS | Status: AC
  Administered 2018-10-29: 18:00:00 1000 mL via INTRAVENOUS

## 2018-10-29 MED ORDER — NOREPINEPHRINE 4 MG/250ML-% IV SOLN: 5.0000 ug/min | INTRAVENOUS | Status: DC

## 2018-10-29 MED ORDER — SODIUM CHLORIDE 0.9 % IV BOLUS (SEPSIS)
250.0000 mL | Freq: Once | INTRAVENOUS | Status: AC
  Administered 2018-10-29: 17:00:00 250 mL via INTRAVENOUS

## 2018-10-29 MED ORDER — NOREPINEPHRINE 4 MG/250ML-% IV SOLN
0.0000 ug/min | INTRAVENOUS | Status: DC
  Administered 2018-10-29: 5 ug/min via INTRAVENOUS
  Filled 2018-10-29: qty 250

## 2018-10-29 MED ORDER — SODIUM CHLORIDE 0.9 % IV SOLN
500.0000 mg | INTRAVENOUS | Status: DC
  Administered 2018-10-29 – 2018-10-30 (×2): 500 mg via INTRAVENOUS
  Filled 2018-10-29 (×4): qty 500

## 2018-10-29 MED ORDER — CHLORHEXIDINE GLUCONATE CLOTH 2 % EX PADS
6.0000 | MEDICATED_PAD | Freq: Every day | CUTANEOUS | Status: DC
  Administered 2018-10-31 (×2): 6 via TOPICAL

## 2018-10-29 MED ORDER — SODIUM CHLORIDE 0.9 % IV SOLN
2.0000 g | Freq: Three times a day (TID) | INTRAVENOUS | Status: DC
  Administered 2018-10-30 – 2018-10-31 (×5): 2 g via INTRAVENOUS
  Filled 2018-10-29 (×5): qty 2

## 2018-10-29 MED ORDER — SODIUM CHLORIDE 0.9 % IV BOLUS (SEPSIS)
1000.0000 mL | Freq: Once | INTRAVENOUS | Status: AC
  Administered 2018-10-29: 1000 mL via INTRAVENOUS

## 2018-10-29 MED ORDER — SODIUM CHLORIDE 0.9 % IV SOLN
INTRAVENOUS | Status: DC
  Administered 2018-10-29 – 2018-11-01 (×5): via INTRAVENOUS

## 2018-10-29 MED ORDER — SODIUM CHLORIDE 0.9 % IV SOLN: 2.0000 g | Freq: Once | INTRAVENOUS | Status: DC

## 2018-10-29 MED ORDER — SODIUM CHLORIDE 0.9 % IV BOLUS (SEPSIS)
500.0000 mL | Freq: Once | INTRAVENOUS | Status: AC
  Administered 2018-10-29: 19:00:00 500 mL via INTRAVENOUS

## 2018-10-29 MED ORDER — VANCOMYCIN HCL 10 G IV SOLR
1250.0000 mg | Freq: Once | INTRAVENOUS | Status: AC
  Administered 2018-10-29: 17:00:00 1250 mg via INTRAVENOUS
  Filled 2018-10-29 (×2): qty 1250

## 2018-10-29 MED ORDER — SODIUM CHLORIDE 0.9 % IV SOLN
2.0000 g | Freq: Once | INTRAVENOUS | Status: AC
  Administered 2018-10-29: 16:00:00 2 g via INTRAVENOUS
  Filled 2018-10-29: qty 2

## 2018-10-29 MED ORDER — METHYLPREDNISOLONE SODIUM SUCC 125 MG IJ SOLR
60.0000 mg | Freq: Two times a day (BID) | INTRAMUSCULAR | Status: AC
  Administered 2018-10-29 – 2018-10-30 (×3): 60 mg via INTRAVENOUS
  Filled 2018-10-29 (×3): qty 2

## 2018-10-29 MED ORDER — SODIUM CHLORIDE 0.9 % IV BOLUS (SEPSIS)
250.0000 mL | Freq: Once | INTRAVENOUS | Status: AC
  Administered 2018-10-29: 22:00:00 250 mL via INTRAVENOUS

## 2018-10-29 MED ORDER — SULFAMETHOXAZOLE-TRIMETHOPRIM 400-80 MG/5ML IV SOLN
20.0000 mg/kg/d | Freq: Three times a day (TID) | INTRAVENOUS | Status: DC
  Administered 2018-10-29 – 2018-10-31 (×5): 338.72 mg via INTRAVENOUS
  Filled 2018-10-29 (×10): qty 21.17

## 2018-10-29 MED ORDER — VANCOMYCIN HCL 10 G IV SOLR
1250.0000 mg | INTRAVENOUS | Status: DC
  Filled 2018-10-29: qty 1250

## 2018-10-29 NOTE — Progress Notes (Signed)
Notified bedside nurse of need to draw lactic acid.  

## 2018-10-29 NOTE — ED Provider Notes (Signed)
[facility] EMERGENCY DEPARTMENT Provider Note   CSN: [id number] Arrival date & time: 10/29/18  1424     History   Chief Complaint No chief complaint on file.   HPI Krystal Short is a 33 y.o. female.     HPI Patient presents 1 week after being discharged from this facility now with concern for generalized illness.  Patient arrives with her mother who assists with the HPI. Patient states that after discharge she was doing generally well, but today, awoke with generalized illness.  On she has been compliant with her medications since discharge. She notes that the only recent notable change prior to last hospitalization was development of facial lesions consistent with dermatitis. Today, since onset she has been progressively weak, without new dyspnea, without focal pain, without vomiting, without diarrhea. With these concerns she presents for evaluation.  Past Medical History:  Diagnosis Date  . Acute hyponatremia 06/03/2017  . Anemia   . CAP (community acquired pneumonia) 06/03/2017  . Facial dermatitis 06/03/2017  . HIV (human immunodeficiency virus infection) ([place])    diagnosed in April 2019  . Pleurisy 06/03/2017  . 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 ([place])   . Thrush of mouth and esophagus ([place])   . UTI (urinary tract infection)     Patient Active Problem List   Diagnosis Date Noted  . Compensated metabolic acidosis   . History of ETT   . Acute metabolic encephalopathy   . Hypophosphatemia   . Protein-calorie malnutrition, moderate ([place])   . [place] (hemophagocytic lymphohistiocytosis) ([place])   . Elevated liver enzymes 10/08/2018  . AIDS (acquired immune deficiency syndrome) ([place])   . ARDS (adult respiratory distress syndrome) ([place])   . Anemia   . Elevated bilirubin   . AKI (acute kidney injury) ([place])   . Acute respiratory failure with hypoxemia ([place]) 08/03/2018  . Fever 08/02/2018  . Hemophagocytosis present in bone marrow ([place])    . Pulmonary infiltrates 07/08/2018  . Lymphadenopathy, generalized 07/08/2018  . Symptomatic anemia 06/10/2018  . Thrombocytopenia ([place]) 06/10/2018  . ASCUS with positive high risk HPV cervical 06/10/2018  . Suspected pneumocystis pneumonia ([place]) 03/08/2018  . Molluscum contagiosum 06/27/2017  . HIV disease ([place])   . Hyponatremia 06/03/2017    Past Surgical History:  Procedure Laterality Date  . BIOPSY  07/12/2018   Procedure: BIOPSY;  Surgeon: Laurin Coder, MD;  Location: [facility];  Service: Endoscopy;;  . BRONCHIAL WASHINGS  07/12/2018   Procedure: BRONCHIAL WASHINGS;  Surgeon: Laurin Coder, MD;  Location: [facility];  Service: Endoscopy;;  . ENDOBRONCHIAL ULTRASOUND N/A 07/12/2018   Procedure: ENDOBRONCHIAL ULTRASOUND;  Surgeon: Laurin Coder, MD;  Location: [facility];  Service: Endoscopy;  Laterality: N/A;  . FINE NEEDLE ASPIRATION BIOPSY  07/12/2018   Procedure: FINE NEEDLE ASPIRATION BIOPSY;  Surgeon: Laurin Coder, MD;  Location: [facility];  Service: Endoscopy;;  . NO PAST SURGERIES    . VIDEO BRONCHOSCOPY N/A 07/12/2018   Procedure: VIDEO BRONCHOSCOPY WITHOUT FLUORO;  Surgeon: Laurin Coder, MD;  Location: [facility];  Service: Endoscopy;  Laterality: N/A;     OB History    Gravida  0   Para  0   Term  0   Preterm  0   AB  0   Living  0     SAB  0   TAB  0   Ectopic  0   Multiple  0   Live Births  0  Home Medications    Prior to Admission medications   Medication Sig Start Date End Date Taking? Authorizing Provider  albuterol (VENTOLIN HFA) 108 (90 Base) MCG/ACT inhaler Inhale 2 puffs into the lungs every 6 (six) hours as needed for wheezing or shortness of breath.    [provider]  atovaquone (MEPRON) 750 MG/5ML suspension Take 10 mLs (1,500 mg total) by mouth daily. 10/23/18 11/22/18  Pokhrel, Corrie Mckusick, MD  azithromycin (ZITHROMAX) 600 MG tablet Take 2 tablets (1,200 mg total) by mouth once a  week. Patient taking differently: Take 1,200 mg by mouth every Tuesday.  09/17/18   Oswald Hillock, MD  benzonatate (TESSALON) 100 MG capsule Take 1 capsule (100 mg total) by mouth 3 (three) times daily. 10/04/18   Lavina Hamman, MD  bictegravir-emtricitabine-tenofovir AF (BIKTARVY) 50-200-25 MG TABS tablet Take 1 tablet by mouth daily. 09/18/18   [place], FNP  chlorhexidine (PERIDEX) 0.12 % solution 15 mLs by Mouth Rinse route 2 (two) times daily. 10/23/18   Pokhrel, Corrie Mckusick, MD  dexamethasone (DECADRON) 6 MG tablet Take 2 tablets (12 mg total) by mouth daily for 7 days. 10/23/18 10/30/18  Pokhrel, Corrie Mckusick, MD  dextromethorphan-guaiFENesin (MUCINEX DM) 30-600 MG 12hr tablet Take 1 tablet by mouth 2 (two) times daily. 10/04/18   Lavina Hamman, MD  feeding supplement, ENSURE ENLIVE, (ENSURE ENLIVE) LIQD Take 237 mLs by mouth 2 (two) times daily between meals. 10/23/18   Pokhrel, [place], MD  lidocaine (LIDODERM) 5 % Place 1 patch onto the skin daily. Remove & Discard patch within 12 hours or as directed by MD 10/23/18   Pokhrel, Corrie Mckusick, MD  magnesium oxide (MAG-OX) 400 (241.3 Mg) MG tablet Take 1 tablet (400 mg total) by mouth 2 (two) times daily. 10/23/18   Pokhrel, Corrie Mckusick, MD  pantoprazole (PROTONIX) 40 MG tablet Take 1 tablet (40 mg total) by mouth daily. 10/23/18 10/23/19  Pokhrel, Corrie Mckusick, MD  potassium chloride SA (K-DUR) 20 MEQ tablet Take 1 tablet (20 mEq total) by mouth daily. 10/23/18   Pokhrel, Corrie Mckusick, MD    Family History Family History  Problem Relation Age of Onset  . Healthy Father   . Healthy Mother   . Asthma Paternal Grandmother     Social History Social History   Tobacco Use  . Smoking status: Former Smoker    Packs/day: 0.25    Years: 8.00    Pack years: 2.00    Types: Cigarettes, Cigars    Quit date: 12/25/2016    Years since quitting: 1.8  . Smokeless tobacco: Never Used  Substance Use Topics  . Alcohol use: Yes    Comment: weekend/socially  . Drug use: No      Allergies   Heparin   Review of Systems Review of Systems  Constitutional:       Per HPI, otherwise negative  HENT:       Per HPI, otherwise negative  Respiratory:       Per HPI, otherwise negative  Cardiovascular:       Per HPI, otherwise negative  Gastrointestinal: Negative for vomiting.  Endocrine:       Negative aside from HPI  Genitourinary:       Neg aside from HPI   Musculoskeletal:       Per HPI, otherwise negative  Skin: Negative.   Allergic/Immunologic: Positive for immunocompromised state.  Neurological: Positive for weakness. Negative for syncope.     Physical Exam Updated Vital Signs BP (!) 100/59 (BP Location: Right Arm)  Pulse (!) 172   Temp (!) 104.8 F (40.4 C) (Rectal)   Resp (!) 28   Ht 5' 1.5" (1.562 m)   Wt 50.8 kg   LMP  (LMP Unknown) Comment: pt is on depo  SpO2 99%   BMI 20.82 kg/m   Physical Exam Vitals signs and nursing note reviewed.  Constitutional:      Appearance: She is well-developed. She is ill-appearing and diaphoretic.     Comments: Thin young female awake and alert  HENT:     Head: Normocephalic and atraumatic.  Eyes:     Conjunctiva/sclera: Conjunctivae normal.  Cardiovascular:     Rate and Rhythm: Regular rhythm. Tachycardia present.  Pulmonary:     Effort: Tachypnea present.  Abdominal:     General: There is no distension.     Tenderness: There is no abdominal tenderness.  Skin:    General: Skin is warm.       Neurological:     Mental Status: She is alert and oriented to person, place, and time.     Cranial Nerves: No cranial nerve deficit.      ED Treatments / Results  Labs (all labs ordered are listed, but only abnormal results are displayed) Labs Reviewed  COMPREHENSIVE METABOLIC PANEL - Abnormal; Notable for the following components:      Result Value   Sodium 133 (*)    CO2 16 (*)    Glucose, Bld 119 (*)    Creatinine, Ser 1.07 (*)    Calcium 8.5 (*)    Total Protein 6.4 (*)    Albumin 2.4  (*)    AST 63 (*)    Alkaline Phosphatase 390 (*)    Total Bilirubin 4.6 (*)    All other components within normal limits  LACTIC ACID, PLASMA - Abnormal; Notable for the following components:   Lactic Acid, Venous 6.4 (*)    All other components within normal limits  PROTIME-INR - Abnormal; Notable for the following components:   Prothrombin Time 18.2 (*)    INR 1.5 (*)    All other components within normal limits  CULTURE, BLOOD (ROUTINE X 2)  CULTURE, BLOOD (ROUTINE X 2)  SARS CORONAVIRUS 2 (HOSPITAL ORDER, [facility])  APTT  CBC WITH DIFFERENTIAL/PLATELET  LACTIC ACID, PLASMA  CBC WITH DIFFERENTIAL/PLATELET    EKG EKG Interpretation  Date/Time:  Tuesday October 29 2018 14:54:11 EDT Ventricular Rate:  167 PR Interval:    QRS Duration: 73 QT Interval:  258 QTC Calculation: 430 R Axis:   55 Text Interpretation:  Sinus tachycardia Ventricular premature complex Probable left atrial enlargement Abnormal ECG Confirmed by Carmin Muskrat [phone]) on 10/29/2018 3:15:50 PM   Radiology Dg Chest Port 1 View  Result Date: 10/29/2018 CLINICAL DATA:  Sepsis. EXAM: PORTABLE CHEST 1 VIEW COMPARISON:  Single-view of the chest 10/15/2018 and 07/04/2018. PA and lateral chest 08/12/2018. CT chest 10/16/2018. FINDINGS: Hazy bilateral pulmonary opacities appear unchanged compared to the 07/04/2018 examination. No consolidative process, pneumothorax or effusion. Heart size is normal. IMPRESSION: Chronic hazy pulmonary opacities may be secondary to chronic interstitial lung disease or sarcoidosis. No acute abnormality is identified. Electronically Signed   By: Inge Rise M.D.   On: 10/29/2018 15:32    Procedures Procedures (including critical care time)  Medications Ordered in ED Medications  sodium chloride 0.9 % bolus 1,000 mL (0 mLs Intravenous Stopped 10/29/18 1538)    And  sodium chloride 0.9 % bolus 500 mL (500 mLs Intravenous  New Bag/Given 10/29/18  1559)    And  sodium chloride 0.9 % bolus 250 mL (has no administration in time range)  ceFEPIme (MAXIPIME) 2 g in sodium chloride 0.9 % 100 mL IVPB (2 g Intravenous New Bag/Given 10/29/18 1556)  vancomycin (VANCOCIN) 1,250 mg in sodium chloride 0.9 % 250 mL IVPB (has no administration in time range)  ceFEPIme (MAXIPIME) 2 g in sodium chloride 0.9 % 100 mL IVPB (has no administration in time range)  vancomycin (VANCOCIN) 1,250 mg in sodium chloride 0.9 % 250 mL IVPB (has no administration in time range)     Initial Impression / Assessment and Plan / ED Course  I have reviewed the triage vital signs and the nursing notes.  Pertinent labs & imaging results that were available during my care of the patient were reviewed by me and considered in my medical decision making (see chart for details).    On initial exam patient is tachycardic, with rate greater than 160, hypotensive, 80/50, and febrile. With concern for sepsis patient received broad-spectrum antibiotics empirically, oxygen supplementation and IV fluids, per protocol.   4:03 PM Heart rates now about the same, though blood pressure is improved 100/60. Initial findings notable for lactic acidosis, greater than 6, x-ray concerning for possible pneumonia.  This 33 year old female with immunocompromise state, HIV, after recent hospitalization presents with a new generalized illness, is found to have initial signs and symptoms concerning for sepsis. Patient received initial broad-spectrum antibiotics, fluids, oxygen, required admission for further monitoring, management. COVID test pending.   Final Clinical Impressions(s) / ED Diagnoses   Final diagnoses:  Sepsis ([place])   CRITICAL CARE Performed by: Carmin Muskrat Total critical care time: 35 minutes Critical care time was exclusive of separately billable procedures and treating other patients. Critical care was necessary to treat or prevent imminent or life-threatening  deterioration. Critical care was time spent personally by me on the following activities: development of treatment plan with patient and/or surrogate as well as nursing, discussions with consultants, evaluation of patient's response to treatment, examination of patient, obtaining history from patient or surrogate, ordering and performing treatments and interventions, ordering and review of laboratory studies, ordering and review of radiographic studies, pulse oximetry and re-evaluation of patient's condition.    Carmin Muskrat, MD 10/29/18 [id number]

## 2018-10-29 NOTE — Progress Notes (Signed)
Code sepsis initiated again at 1810.   Initial code sepsis started at 1505.

## 2018-10-29 NOTE — ED Provider Notes (Signed)
Care assumed from Dr. Vanita Panda at shift change.  Patient with history of HIV disease/AIDS with recent admission for sepsis to [facility] long requiring intubation and pressor support.  Patient presents today with complaints of fever and not feeling well.  She arrived here febrile with a temp of 104.8, Dr. Vanita Panda initiated work-up including cultures of blood and urine and laboratory studies.  Patient is hypotensive, tachycardic despite sepsis bolus of fluids.  Care was discussed with Dr. Nelda Marseille from critical care.  Patient has been evaluated and will be admitted to the ICU for further management.  CRITICAL CARE Performed by: Veryl Speak Total critical care time: 35 minutes Critical care time was exclusive of separately billable procedures and treating other patients. Critical care was necessary to treat or prevent imminent or life-threatening deterioration. Critical care was time spent personally by me on the following activities: development of treatment plan with patient and/or surrogate as well as nursing, discussions with consultants, evaluation of patient's response to treatment, examination of patient, obtaining history from patient or surrogate, ordering and performing treatments and interventions, ordering and review of laboratory studies, ordering and review of radiographic studies, pulse oximetry and re-evaluation of patient's condition.    Veryl Speak, MD 10/29/18 2149

## 2018-10-29 NOTE — H&P (Addendum)
NAMEMiski Short, MRN:  [id number], DOB:  05-01-1985, LOS: 0 ADMISSION DATE:  10/29/2018, CONSULTATION DATE:  9/22 REFERRING MD: Dr. Stark Jock EDP, CHIEF COMPLAINT: Hypotension  Brief History   33 year old female with HIV/AIDS.  Recently admitted for respiratory failure requiring mechanical ventilation.  She had diffuse pulmonary infiltrates of uncertain etiology.  She was treated with broad-spectrum antibiotics and was mentally discharged home.  Now presenting with fevers and found to be hypotensive and admitted to ICU.  History of present illness   33 year old female with past medical history as below, which is significant for HIV/AIDS, chronic thrombocytopenia, pneumonia secondary to PGP, and oral thrush.  She was recently admitted to [facility] from 8/29 through for respiratory failure secondary to bilateral infiltrates required mechanical ventilation.  BAL was positive for staph epidermidis and there was also concern for [place].  She was treated with empiric antibiotics and steroids.  She is able to be extubated on 9/7.  Course was also complicated by acute kidney injury and thrombocytopenia.  She was discharged home on 9/16.  She then again presented to [facility] emergency department on 9/22.  She had reportedly been doing very well since discharge until 9/22 when she awoke with generalized illness.  She had reportedly been compliant with her discharge medications.  Reason for presentation was generalized weakness.  She denied dyspnea, nausea/vomiting, chest pain, cough, abdominal pain.  Upon arrival to the emergency department she was noted to be hypotensive.  There was of course concern for sepsis she was noted on broad-spectrum antibiotics and treated with IV fluids.  Despite 2 L of IV fluid she remained hypotensive and PCCM was asked to admit.  Past Medical History   has a past medical history of Acute hyponatremia (06/03/2017), Anemia, CAP (community acquired pneumonia) (06/03/2017),  Facial dermatitis (06/03/2017), HIV (human immunodeficiency virus infection) ([place]), Pleurisy (06/03/2017), 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 ([place]), Thrush of mouth and esophagus ([place]), and UTI (urinary tract infection).  Significant Hospital Events   8/30-9/16 admit for respiratory failure 9/22  Admit for shock  Consults:  ID  Procedures:    Significant Diagnostic Tests:  RUQ [place] 9/22 >  Micro Data:  Blood 9/22 > Urine 9/22 > Sputum 9/22 >  Antimicrobials:  Cefepime 9/22 > Vancomycin 9/22 > Azithromycin 9/22 > Bactrim 9/22 >  Interim history/subjective:    Objective   Blood pressure (!) 71/41, pulse (!) 140, temperature (!) 104.8 F (40.4 C), temperature source Rectal, resp. rate (!) 30, height 5' 1.5" (1.562 m), weight 50.8 kg, SpO2 100 %.       No intake or output data in the 24 hours ending 10/29/18 1804 Filed Weights   10/29/18 1442  Weight: 50.8 kg    Examination: General: frail young adult female in NAD HENT: [place]/AT, PERRL, no JVD. Pustules on face, scleral icterus.  Lungs: Clear, no distress.  Cardiovascular: RRR, no MRG Abdomen: Soft, non-tender, non-distended Extremities: No acute deformity or ROM limitation Neuro: Alert oriented non-focal  Resolved Hospital Problem list     Assessment & Plan:   Shock: presumably septic +/- hypovolemic, however, no clear source. She is compromised secondary to HIV/AIDS with recent CD4 count 41. Concern for PJP. BAL was negative on last admission and was positive for staph epidermidis.  - Admit to ICU - Empiric antibiotics as above, empiric steroids - Follow cultures - Aggressive IVF resuscitation - norepinephrine for MAP goal > 90mHg - Trend lactic  HIV/AIDS: most recent CD4 58 -  ID consulted - send CD4, Viral load  Scleral icterus - LFT - RUQ ultrasound  Anemia Thrombocytopenia - both at recent baseline - Follow CBC - no role for transfusion at this point.  - Followed by hematology  as outpatient   Best practice:  Diet: NPO Pain/Anxiety/Delirium protocol (if indicated): NA VAP protocol (if indicated): NA DVT prophylaxis: SDCs GI prophylaxis: NA Glucose control: NA Mobility: BR Code Status: FULL Family Communication: patient and family updated bedside in ED Disposition: ICU  Labs   CBC: Recent Labs  Lab 10/23/18 0509 10/29/18 1548  WBC 7.5 9.4  NEUTROABS  --  8.6*  HGB 7.1* 8.1*  HCT 22.5* 24.9*  MCV 98.7 96.5  PLT 56* 52*    Basic Metabolic Panel: Recent Labs  Lab 10/23/18 0509 10/29/18 1505  NA 136  135 133*  K 3.2*  3.3* 4.8  CL 110  110 104  CO2 19*  20* 16*  GLUCOSE 82  85 119*  BUN _0 CREATININE 0.54  0.64 1.07*  CALCIUM 6.9*  7.1* 8.5*  PHOS 2.8  --    GFR: Estimated Creatinine Clearance: 58.9 mL/min (A) (by C-G formula based on SCr of 1.07 mg/dL (H)). Recent Labs  Lab 10/23/18 0509 10/29/18 1505 10/29/18 1548  WBC 7.5  --  9.4  LATICACIDVEN  --  6.4*  --     Liver Function Tests: Recent Labs  Lab 10/23/18 0509 10/29/18 1505  AST  --  63*  ALT  --  33  ALKPHOS  --  390*  BILITOT  --  4.6*  PROT  --  6.4*  ALBUMIN 2.4* 2.4*   No results for input(s): LIPASE, AMYLASE in the last 168 hours. No results for input(s): AMMONIA in the last 168 hours.  ABG    Component Value Date/Time   PHART 7.480 (H) 10/14/2018 2230   PCO2ART 35.2 10/14/2018 2230   PO2ART 467 (H) 10/14/2018 2230   HCO3 25.9 10/14/2018 2230   TCO2 15 (L) 10/06/2018 0300   ACIDBASEDEF 10.9 (H) 10/08/2018 1139   O2SAT 100.0 10/14/2018 2230     Coagulation Profile: Recent Labs  Lab 10/29/18 1505  INR 1.5*    Cardiac Enzymes: No results for input(s): CKTOTAL, CKMB, CKMBINDEX, TROPONINI in the last 168 hours.  HbA1C: Hgb A1c MFr Bld  Date/Time Value Ref Range Status  08/09/2018 02:28 AM 5.5 4.8 - 5.6 % Final    Comment:    (NOTE) Pre diabetes:          5.7%-6.4% Diabetes:              >6.4% Glycemic control for   <7.0%  adults with diabetes   08/02/2018 06:46 PM 4.7 (L) 4.8 - 5.6 % Final    Comment:    (NOTE) Pre diabetes:          5.7%-6.4% Diabetes:              >6.4% Glycemic control for   <7.0% adults with diabetes     CBG: Recent Labs  Lab 10/23/18 [place] 94    Review of Systems:    Bolds are positive  Constitutional: weight loss, gain, night sweats, Fevers, chills, fatigue .  HEENT: headaches, Sore throat, sneezing, nasal congestion, post nasal drip, Difficulty swallowing, Tooth/dental problems, visual complaints visual changes, ear ache CV:  chest pain, radiates:,Orthopnea, PND, swelling in lower extremities, dizziness, palpitations, syncope.  GI  heartburn, indigestion, abdominal pain, nausea, vomiting, diarrhea, change in  bowel habits, loss of appetite, bloody stools.  Resp: cough, productive: , hemoptysis, dyspnea, chest pain, pleuritic.  Skin: rash or itching or icterus GU: dysuria, change in color of urine, urgency or frequency. flank pain, hematuria  MS: joint pain or swelling. decreased range of motion  Psych: change in mood or affect. depression or anxiety.  Neuro: difficulty with speech, weakness, numbness, ataxia    Past Medical History  She,  has a past medical history of Acute hyponatremia (06/03/2017), Anemia, CAP (community acquired pneumonia) (06/03/2017), Facial dermatitis (06/03/2017), HIV (human immunodeficiency virus infection) ([place]), Pleurisy (06/03/2017), 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 ([place]), Thrush of mouth and esophagus ([place]), and UTI (urinary tract infection).   Surgical History    Past Surgical History:  Procedure Laterality Date  . BIOPSY  07/12/2018   Procedure: BIOPSY;  Surgeon: Laurin Coder, MD;  Location: [facility] ENDOSCOPY;  Service: Endoscopy;;  . BRONCHIAL WASHINGS  07/12/2018   Procedure: BRONCHIAL WASHINGS;  Surgeon: Laurin Coder, MD;  Location: [facility];  Service: Endoscopy;;  . ENDOBRONCHIAL ULTRASOUND N/A  07/12/2018   Procedure: ENDOBRONCHIAL ULTRASOUND;  Surgeon: Laurin Coder, MD;  Location: [facility];  Service: Endoscopy;  Laterality: N/A;  . FINE NEEDLE ASPIRATION BIOPSY  07/12/2018   Procedure: FINE NEEDLE ASPIRATION BIOPSY;  Surgeon: Laurin Coder, MD;  Location: [facility];  Service: Endoscopy;;  . NO PAST SURGERIES    . VIDEO BRONCHOSCOPY N/A 07/12/2018   Procedure: VIDEO BRONCHOSCOPY WITHOUT FLUORO;  Surgeon: Laurin Coder, MD;  Location: [facility];  Service: Endoscopy;  Laterality: N/A;     Social History   reports that she quit smoking about 22 months ago. Her smoking use included cigarettes and cigars. She has a 2.00 pack-year smoking history. She has never used smokeless tobacco. She reports current alcohol use. She reports that she does not use drugs.   Family History   Her family history includes Asthma in her paternal grandmother; Healthy in her father and mother.   Allergies Allergies  Allergen Reactions  . Heparin Other (See Comments)    Unknown reaction     Home Medications  Prior to Admission medications   Medication Sig Start Date End Date Taking? Authorizing Provider  albuterol (VENTOLIN HFA) 108 (90 Base) MCG/ACT inhaler Inhale 2 puffs into the lungs every 6 (six) hours as needed for wheezing or shortness of breath.    [provider]  atovaquone (MEPRON) 750 MG/5ML suspension Take 10 mLs (1,500 mg total) by mouth daily. 10/23/18 11/22/18  Pokhrel, Corrie Mckusick, MD  azithromycin (ZITHROMAX) 600 MG tablet Take 2 tablets (1,200 mg total) by mouth once a week. Patient taking differently: Take 1,200 mg by mouth every Tuesday.  09/17/18   Oswald Hillock, MD  benzonatate (TESSALON) 100 MG capsule Take 1 capsule (100 mg total) by mouth 3 (three) times daily. 10/04/18   Lavina Hamman, MD  bictegravir-emtricitabine-tenofovir AF (BIKTARVY) 50-200-25 MG TABS tablet Take 1 tablet by mouth daily. 09/18/18   [place], FNP  chlorhexidine (PERIDEX) 0.12  % solution 15 mLs by Mouth Rinse route 2 (two) times daily. 10/23/18   Pokhrel, Corrie Mckusick, MD  dexamethasone (DECADRON) 6 MG tablet Take 2 tablets (12 mg total) by mouth daily for 7 days. 10/23/18 10/30/18  Pokhrel, Corrie Mckusick, MD  dextromethorphan-guaiFENesin (MUCINEX DM) 30-600 MG 12hr tablet Take 1 tablet by mouth 2 (two) times daily. 10/04/18   Lavina Hamman, MD  feeding supplement, ENSURE ENLIVE, (ENSURE ENLIVE) LIQD Take 237 mLs  by mouth 2 (two) times daily between meals. 10/23/18   Pokhrel, [place], MD  lidocaine (LIDODERM) 5 % Place 1 patch onto the skin daily. Remove & Discard patch within 12 hours or as directed by MD 10/23/18   Pokhrel, Corrie Mckusick, MD  magnesium oxide (MAG-OX) 400 (241.3 Mg) MG tablet Take 1 tablet (400 mg total) by mouth 2 (two) times daily. 10/23/18   Pokhrel, Corrie Mckusick, MD  pantoprazole (PROTONIX) 40 MG tablet Take 1 tablet (40 mg total) by mouth daily. 10/23/18 10/23/19  Pokhrel, Corrie Mckusick, MD  potassium chloride SA (K-DUR) 20 MEQ tablet Take 1 tablet (20 mEq total) by mouth daily. 10/23/18   Flora Lipps, MD     Critical care time: 40 mins     Georgann Housekeeper, [facility] Pager [phone] or [phone]  10/29/2018 6:31 PM  Attending Note:  33 year old female with AIDS who presents to PCCM with septic shock.  She was recently hospitalized for respiratory failure and was intubated.  On exam, lungs are clear with blisters on the face with scleral icterus.  I reviewed CXR myself, no acute disease noted.  Discussed with PCCM-NP and ID-MD.  Will admit to the ICU.  Start vanc, cefepime, zithromax and bactrim.  Pressors peripherally for now if needed but will fluid resuscitate first.  ID consult called.  Family and patient updated bedside.  The patient is critically ill with multiple organ systems failure and requires high complexity decision making for assessment and support, frequent evaluation and titration of therapies, application of advanced monitoring  technologies and extensive interpretation of multiple databases.   Critical Care Time devoted to patient care services described in this note is  35  Minutes. This time reflects time of care of this signee Dr Jennet Maduro. This critical care time does not reflect procedure time, or teaching time or supervisory time of PA/NP/Med student/Med Resident etc but could involve care discussion time.  Rush Farmer, M.D. [facility] Pulmonary/Critical Care Medicine. Pager: [phone]. After hours pager: [phone].

## 2018-10-29 NOTE — Progress Notes (Addendum)
Pharmacy Antibiotic Note  Krystal Short is a 33 y.o. female admitted on 10/29/2018 with sepsis-HCAP. Recently discharge on 9/16 for sepsis and was intubated. PMH significant for HIV/AIDs (Azithro PTA for MAC prophylaxis), chronic thrombocytopenia, hx of PCP pneumonia and bacterial pneumonia. Pharmacy has been consulted for vancomycin and cefepime dosing. CCM consulted pharmacy for bactrim dosing for PCP. Ferbile, WBC, 9.4, Scr 0.54, K 4.8.  Plan: Cefepime 2 g IV 1x then 2 g IV q8h Vancomycin 1250 mg IV 1x then 1250 mg q24h (estAUC 490, goal AUC 400-550, Scr used 0.8) Bactrim 338.72 mg IV q8h - (20 mg/kg) Monitor clinical improvement, renal function, potassium Monitor C/s  Height: 5' 1.5" (156.2 cm) Weight: 112 lb (50.8 kg) IBW/kg (Calculated) : 48.95  Temp (24hrs), Avg:104.8 F (40.4 C), Min:104.8 F (40.4 C), Max:104.8 F (40.4 C)  Recent Labs  Lab 10/23/18 0509  WBC 7.5  CREATININE 0.54  0.64    Estimated Creatinine Clearance: 78.8 mL/min (by C-G formula based on SCr of 0.54 mg/dL).    Allergies  Allergen Reactions  . Heparin Other (See Comments)    Unknown reaction    Antimicrobials this admission: Cefepime 9/22 >> Vancomycin 9/22 >> Bactrim 9/22 >>   Microbiology results: 9/22 BCx: pending 9/22 COVID: pending   Thank you for allowing pharmacy to be a part of this patient's care.  Lorel Monaco, PharmD PGY1 Ambulatory Care Resident [employer] # [phone]

## 2018-10-29 NOTE — ED Notes (Addendum)
Attempted to get blood but was not able to. RN notified.

## 2018-10-29 NOTE — ED Triage Notes (Signed)
Pt here for tachycardia and fever. Pt recently discharged on Wednesday for sepsis and was intubated. Pt not in respiratory distress at this time. Negative for COVID last admission

## 2018-10-30 ENCOUNTER — Inpatient Hospital Stay (HOSPITAL_COMMUNITY): Payer: 59

## 2018-10-30 ENCOUNTER — Encounter (HOSPITAL_COMMUNITY): Payer: Self-pay | Admitting: Oncology

## 2018-10-30 ENCOUNTER — Encounter: Payer: Self-pay | Admitting: *Deleted

## 2018-10-30 DIAGNOSIS — D649 Anemia, unspecified: Secondary | ICD-10-CM

## 2018-10-30 DIAGNOSIS — R918 Other nonspecific abnormal finding of lung field: Secondary | ICD-10-CM

## 2018-10-30 DIAGNOSIS — Z87891 Personal history of nicotine dependence: Secondary | ICD-10-CM

## 2018-10-30 DIAGNOSIS — D696 Thrombocytopenia, unspecified: Secondary | ICD-10-CM

## 2018-10-30 DIAGNOSIS — Z888 Allergy status to other drugs, medicaments and biological substances status: Secondary | ICD-10-CM

## 2018-10-30 DIAGNOSIS — Z8701 Personal history of pneumonia (recurrent): Secondary | ICD-10-CM

## 2018-10-30 LAB — CBC
HCT: 22.2 % — ABNORMAL LOW (ref 36.0–46.0)
Hemoglobin: 7.2 g/dL — ABNORMAL LOW (ref 12.0–15.0)
MCH: 31.3 pg (ref 26.0–34.0)
MCHC: 32.4 g/dL (ref 30.0–36.0)
MCV: 96.5 fL (ref 80.0–100.0)
Platelets: 41 10*3/uL — ABNORMAL LOW (ref 150–400)
RBC: 2.3 MIL/uL — ABNORMAL LOW (ref 3.87–5.11)
RDW: 23.6 % — ABNORMAL HIGH (ref 11.5–15.5)
WBC: 13 10*3/uL — ABNORMAL HIGH (ref 4.0–10.5)
nRBC: 0 % (ref 0.0–0.2)

## 2018-10-30 LAB — URINALYSIS, ROUTINE W REFLEX MICROSCOPIC
Bacteria, UA: NONE SEEN
Bilirubin Urine: NEGATIVE
Glucose, UA: NEGATIVE mg/dL
Hgb urine dipstick: NEGATIVE
Ketones, ur: NEGATIVE mg/dL
Leukocytes,Ua: NEGATIVE
Nitrite: NEGATIVE
Protein, ur: NEGATIVE mg/dL
Specific Gravity, Urine: 1.012 (ref 1.005–1.030)
pH: 7 (ref 5.0–8.0)

## 2018-10-30 LAB — COMPREHENSIVE METABOLIC PANEL
ALT: 23 U/L (ref 0–44)
AST: 25 U/L (ref 15–41)
Albumin: 1.5 g/dL — ABNORMAL LOW (ref 3.5–5.0)
Alkaline Phosphatase: 216 U/L — ABNORMAL HIGH (ref 38–126)
Anion gap: 6 (ref 5–15)
BUN: 16 mg/dL (ref 6–20)
CO2: 14 mmol/L — ABNORMAL LOW (ref 22–32)
Calcium: 6 mg/dL — CL (ref 8.9–10.3)
Chloride: 120 mmol/L — ABNORMAL HIGH (ref 98–111)
Creatinine, Ser: 0.74 mg/dL (ref 0.44–1.00)
GFR calc Af Amer: >60 mL/min (ref >60)
GFR calc non Af Amer: >60 mL/min (ref >60)
Glucose, Bld: 174 mg/dL — ABNORMAL HIGH (ref 70–99)
Potassium: 3.6 mmol/L (ref 3.5–5.1)
Sodium: 140 mmol/L (ref 135–145)
Total Bilirubin: 2.3 mg/dL — ABNORMAL HIGH (ref 0.3–1.2)
Total Protein: 4.7 g/dL — ABNORMAL LOW (ref 6.5–8.1)

## 2018-10-30 LAB — RAPID URINE DRUG SCREEN, HOSP PERFORMED
Amphetamines: NOT DETECTED
Barbiturates: NOT DETECTED
Benzodiazepines: NOT DETECTED
Cocaine: NOT DETECTED
Opiates: NOT DETECTED
Tetrahydrocannabinol: NOT DETECTED

## 2018-10-30 LAB — APTT: aPTT: 41 seconds — ABNORMAL HIGH (ref 24–36)

## 2018-10-30 LAB — TRIGLYCERIDES: Triglycerides: 116 mg/dL (ref <150)

## 2018-10-30 LAB — LACTIC ACID, PLASMA
Lactic Acid, Venous: 4.7 mmol/L (ref 0.5–1.9)
Lactic Acid, Venous: 4.6 mmol/L (ref 0.5–1.9)
Lactic Acid, Venous: 4.9 mmol/L (ref 0.5–1.9)

## 2018-10-30 LAB — PROTIME-INR
INR: 1.9 — ABNORMAL HIGH (ref 0.8–1.2)
Prothrombin Time: 21.9 seconds — ABNORMAL HIGH (ref 11.4–15.2)

## 2018-10-30 LAB — PHOSPHORUS: Phosphorus: 3.6 mg/dL (ref 2.5–4.6)

## 2018-10-30 LAB — C-REACTIVE PROTEIN: CRP: 23.5 mg/dL — ABNORMAL HIGH (ref <1.0)

## 2018-10-30 LAB — GLUCOSE, CAPILLARY
Glucose-Capillary: 178 mg/dL — ABNORMAL HIGH (ref 70–99)
Glucose-Capillary: 218 mg/dL — ABNORMAL HIGH (ref 70–99)
Glucose-Capillary: 97 mg/dL (ref 70–99)
Glucose-Capillary: 98 mg/dL (ref 70–99)

## 2018-10-30 LAB — PROCALCITONIN: Procalcitonin: >150 ng/mL

## 2018-10-30 LAB — SEDIMENTATION RATE: Sed Rate: 107 mm/hr — ABNORMAL HIGH (ref 0–22)

## 2018-10-30 LAB — MAGNESIUM: Magnesium: 1 mg/dL — ABNORMAL LOW (ref 1.7–2.4)

## 2018-10-30 LAB — FERRITIN: Ferritin: >7500 ng/mL — ABNORMAL HIGH (ref 11–307)

## 2018-10-30 LAB — LACTATE DEHYDROGENASE: LDH: 470 U/L — ABNORMAL HIGH (ref 98–192)

## 2018-10-30 MED ORDER — SODIUM BICARBONATE 8.4 % IV SOLN
150.0000 meq | Freq: Once | INTRAVENOUS | Status: AC
  Administered 2018-10-30: 18:00:00 150 meq via INTRAVENOUS
  Filled 2018-10-30: qty 150

## 2018-10-30 MED ORDER — MAGNESIUM SULFATE 4 GM/100ML IV SOLN
4.0000 g | Freq: Once | INTRAVENOUS | Status: AC
  Administered 2018-10-30: 08:00:00 4 g via INTRAVENOUS
  Filled 2018-10-30: qty 100

## 2018-10-30 MED ORDER — INSULIN ASPART 100 UNIT/ML ~~LOC~~ SOLN
0.0000 [IU] | Freq: Four times a day (QID) | SUBCUTANEOUS | Status: DC
  Administered 2018-10-30 – 2018-10-31 (×2): 2 [IU] via SUBCUTANEOUS

## 2018-10-30 MED ORDER — MAGIC MOUTHWASH W/LIDOCAINE
5.0000 mL | Freq: Four times a day (QID) | ORAL | Status: DC
  Administered 2018-10-30 – 2018-11-01 (×5): 5 mL via ORAL
  Filled 2018-10-30 (×14): qty 5

## 2018-10-30 MED ORDER — POTASSIUM CHLORIDE 10 MEQ/100ML IV SOLN: 10.0000 meq | INTRAVENOUS | Status: DC

## 2018-10-30 MED ORDER — ROMIPLOSTIM 250 MCG ~~LOC~~ SOLR
2.0000 ug/kg | Freq: Once | SUBCUTANEOUS | Status: AC
  Administered 2018-10-30: 17:00:00 100 ug via SUBCUTANEOUS
  Filled 2018-10-30: qty 0.2

## 2018-10-30 MED ORDER — POTASSIUM CHLORIDE 10 MEQ/100ML IV SOLN
10.0000 meq | INTRAVENOUS | Status: AC
  Administered 2018-10-30 (×2): 10 meq via INTRAVENOUS
  Filled 2018-10-30 (×2): qty 100

## 2018-10-30 NOTE — Consult Note (Addendum)
[place]  [phone] Fax:([phone] [place]  Referring MD: Dr. Jennet Maduro  Reason for Referral: Anemia, thrombocytopenia, secondary [place]  HPI: Ms. Natarajan is a 33 year old female who is well-known to the hematology practice.  This is now her sixth hospital admission since May 2020.  She has a past medical history significant for HIV/AIDS, chronic thrombocytopenia, pneumonia secondary to PGP.  She was recently hospitalized at [facility] long hospital from 10/05/2018 through 10/23/2018.  During her most recent hospitalization, she had respiratory failure secondary to bilateral infiltrates and required mechanical ventilation.  During her hospitalization, there was concern for secondary HL H.  She was treated with antibiotics and steroid.  Course was complicated by acute kidney injury and thrombocytopenia.  During her hospitalization, she received high-dose dexamethasone starting at 20 mg IV daily for secondary HLH.  Dose was slowly tapered and she was discharged home on dexamethasone 12 milligrams daily. She states that she was taking this as directed. The patient presented to the emergency room on 10/29/2018 secondary to a high fever.  The patient's mother is at the bedside and states that she had a fever up to 105 at home.  She reported shortness of breath and cough.  She also had generalized weakness at home.  She was hypotensive in the emergency room and was noted to have an elevated lactic acid up to 6.4.  She was pancultured and started on IV antibiotics.  She was admitted to the ICU.   Today, she reports that she is feeling better.  She feels as though her breathing has improved.  Reports an ongoing nonproductive cough.  Still has some hoarseness in her voice.  Denies headaches and dizziness.  No chest discomfort.  Denies abdominal pain, nausea, vomiting.  Denies bleeding.  Hematology was asked see the patient to make recommendations regarding her  anemia, thrombocytopenia, and secondary [place].  Past Medical History:  Diagnosis Date   Acute hyponatremia 06/03/2017   Anemia    CAP (community acquired pneumonia) 06/03/2017   Facial dermatitis 06/03/2017   HIV (human immunodeficiency virus infection) ([place])    diagnosed in April 2019   Pleurisy 06/03/2017   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 ([place])    Thrush of mouth and esophagus ([place])    UTI (urinary tract infection)   :    Past Surgical History:  Procedure Laterality Date   BIOPSY  07/12/2018   Procedure: BIOPSY;  Surgeon: Laurin Coder, MD;  Location: [facility];  Service: Endoscopy;;   BRONCHIAL WASHINGS  07/12/2018   Procedure: BRONCHIAL WASHINGS;  Surgeon: Laurin Coder, MD;  Location: [facility];  Service: Endoscopy;;   ENDOBRONCHIAL ULTRASOUND N/A 07/12/2018   Procedure: ENDOBRONCHIAL ULTRASOUND;  Surgeon: Laurin Coder, MD;  Location: [facility];  Service: Endoscopy;  Laterality: N/A;   FINE NEEDLE ASPIRATION BIOPSY  07/12/2018   Procedure: FINE NEEDLE ASPIRATION BIOPSY;  Surgeon: Laurin Coder, MD;  Location: [facility];  Service: Endoscopy;;   NO PAST SURGERIES     VIDEO BRONCHOSCOPY N/A 07/12/2018   Procedure: VIDEO BRONCHOSCOPY WITHOUT FLUORO;  Surgeon: Laurin Coder, MD;  Location: [facility];  Service: Endoscopy;  Laterality: N/A;  :   CURRENT MEDS: Current Facility-Administered Medications  Medication Dose Route Frequency Provider Last Rate Last Dose   0.9 %  sodium chloride infusion   Intravenous Continuous Rush Farmer, MD 100 mL/hr at 10/30/18 0600     azithromycin (ZITHROMAX) 500 mg  in sodium chloride 0.9 % 250 mL IVPB  500 mg Intravenous Q24H Rush Farmer, MD   Stopped at 10/29/18 2020   ceFEPIme (MAXIPIME) 2 g in sodium chloride 0.9 % 100 mL IVPB  2 g Intravenous Q8H Nwogu, Ivy A, RPH 200 mL/hr at 10/30/18 0945 2 g at 10/30/18 0945   Chlorhexidine Gluconate Cloth 2 % PADS 6 each  6 each  Topical Daily Rush Farmer, MD       insulin aspart (novoLOG) injection 0-15 Units  0-15 Units Subcutaneous Q6H Minor, Grace Bushy, NP   2 Units at 10/30/18 1252   methylPREDNISolone sodium succinate (SOLU-MEDROL) 125 mg/2 mL injection 60 mg  60 mg Intravenous Q12H Rush Farmer, MD   60 mg at 10/30/18 7048   norepinephrine (LEVOPHED) 60m in 2510mpremix infusion  0-10 mcg/min Intravenous Titrated YaRush FarmerMD   Stopped at 10/30/18 0214   sulfamethoxazole-trimethoprim (BACTRIM) 338.72 mg in dextrose 5 % 500 mL IVPB  20 mg/kg/day Intravenous Q8H Nwogu, Ivy A, RPH 347.4 mL/hr at 10/30/18 1218 338.72 mg at 10/30/18 1218      Allergies  Allergen Reactions   Heparin Other (See Comments)    Unknown reaction  :  Family History  Problem Relation Age of Onset   Healthy Father    Healthy Mother    Asthma Paternal Grandmother   :  Social History   Socioeconomic History   Marital status: Married    Spouse name: GrBelenda Cruise Number of children: Not on file   Years of education: college   Highest education level: Bachelor's degree (e.g., BA, AB, BS)  Occupational History   Occupation: [employer]  Employer: [employer]   Financial resource strain: Not hard at all   Food insecurity    Worry: Never true    Inability: Never true   Transportation needs    Medical: No    Non-medical: No  Tobacco Use   Smoking status: Former Smoker    Packs/day: 0.25    Years: 8.00    Pack years: 2.00    Types: Cigarettes, Cigars    Quit date: 12/25/2016    Years since quitting: 1.8   Smokeless tobacco: Never Used  Substance and Sexual Activity   Alcohol use: Yes    Comment: weekend/socially   Drug use: No   Sexual activity: Not Currently    Birth control/protection: Injection  Lifestyle   Physical activity    Days per week: 0 days    Minutes per session: 0 min   Stress: Not at all  Relationships   Social connections    Talks on  phone: Once a week    Gets together: Once a week    Attends religious service: More than 4 times per year    Active member of club or organization: No    Attends meetings of clubs or organizations: Never    Relationship status: Married   Intimate partner violence    Fear of current or ex partner: No    Emotionally abused: No    Physically abused: No    Forced sexual activity: No  Other Topics Concern   Not on file  Social History Narrative   Patient does not drive, if her husband cannot provide transportation she takes uses [employer] services   She has not smoked since 2018  :  REVIEW OF SYSTEMS: A comprehensive 14 point review of systems was negative except as noted  in HPI.  Exam: Patient Vitals for the past 24 hrs:  BP Temp Temp src Pulse Resp SpO2 Height Weight  10/30/18 1100 104/68 -- -- (!) 122 (!) 31 100 % -- --  10/30/18 1000 119/84 -- -- (!) 128 (!) 26 100 % -- --  10/30/18 0900 111/79 -- -- (!) 121 (!) 21 100 % -- --  10/30/18 0807 -- 98 F (36.7 C) Oral -- -- -- -- --  10/30/18 0800 116/82 -- -- (!) 121 (!) 35 100 % -- --  10/30/18 0700 108/81 -- -- (!) 117 (!) 30 100 % -- --  10/30/18 0630 114/84 -- -- (!) 119 (!) 28 100 % -- --  10/30/18 0600 112/90 -- -- (!) 113 (!) 32 100 % -- --  10/30/18 0500 114/89 -- -- (!) 110 (!) 29 100 % -- 110 lb 0.2 oz (49.9 kg)  10/30/18 0345 (!) 116/91 -- -- (!) 106 (!) 27 100 % -- --  10/30/18 0342 -- 97.8 F (36.6 C) Oral -- -- -- -- --  10/30/18 0230 (!) 121/95 -- -- (!) 102 (!) 33 100 % -- --  10/30/18 0215 111/84 -- -- (!) 105 (!) 29 100 % -- --  10/30/18 0200 122/82 -- -- 99 (!) 27 100 % -- --  10/30/18 0145 125/83 -- -- 99 (!) 27 100 % -- --  10/30/18 0130 124/88 -- -- 92 (!) 27 100 % -- --  10/30/18 0115 123/84 -- -- 92 (!) 30 100 % -- --  10/30/18 0100 112/84 -- -- 93 (!) 29 100 % -- --  10/30/18 0030 120/80 -- -- 99 (!) 32 100 % -- --  10/30/18 0015 106/76 -- -- (!) 101 (!) 30 100 % -- --  10/30/18 0000 116/83 -- -- (!) 102  (!) 23 100 % -- --  10/29/18 2356 -- 97.9 F (36.6 C) Oral -- -- -- -- --  10/29/18 2315 106/83 -- -- (!) 102 (!) 29 100 % -- --  10/29/18 2245 128/78 -- -- -- (!) 27 -- -- --  10/29/18 2156 127/80 -- -- (!) 125 (!) 36 100 % -- --  10/29/18 2130 112/72 -- -- (!) 126 (!) 32 100 % -- --  10/29/18 2115 129/70 -- -- -- (!) 31 -- -- --  10/29/18 2100 (!) 101/54 -- -- (!) 126 (!) 30 100 % -- --  10/29/18 2046 109/71 -- -- (!) 126 (!) 31 100 % -- --  10/29/18 2045 111/70 -- -- -- (!) 29 -- -- --  10/29/18 2015 100/61 -- -- (!) 130 (!) 36 100 % -- --  10/29/18 2000 (!) 102/57 -- -- -- (!) 31 -- -- --  10/29/18 1900 (!) 88/46 -- -- (!) 130 (!) 32 100 % -- --  10/29/18 1830 (!) 90/46 -- -- (!) 130 (!) 31 100 % -- --  10/29/18 1815 (!) 73/41 -- -- -- (!) 29 -- -- --  10/29/18 1800 (!) 71/41 -- -- -- (!) 30 -- -- --  10/29/18 1755 (!) 80/47 -- -- -- (!) 30 -- -- --  10/29/18 1751 (!) 80/47 -- -- -- (!) 28 -- -- --  10/29/18 1745 (!) 70/41 -- -- -- (!) 30 -- -- --  10/29/18 1725 (!) 83/40 -- -- (!) 140 (!) 26 100 % -- --  10/29/18 1720 (!) 89/52 -- -- -- (!) 25 -- -- --  10/29/18 1715 (!) 83/53 -- -- -- (!) 33 -- -- --  10/29/18 1710 (!) 91/53 -- -- -- (!) 28 -- -- --  10/29/18 1700 (!) 85/48 -- -- -- (!) 32 -- -- --  10/29/18 1655 (!) 85/51 -- -- (!) 140 (!) 35 99 % -- --  10/29/18 1650 (!) 83/51 -- -- -- (!) 22 -- -- --  10/29/18 1645 (!) 83/53 -- -- -- (!) 29 -- -- --  10/29/18 1540 (!) 100/59 -- -- (!) 172 (!) 28 99 % -- --  10/29/18 1530 (!) 97/44 -- -- -- (!) 30 -- -- --  10/29/18 1525 (!) 91/55 -- -- -- (!) 34 -- -- --  10/29/18 1520 (!) 98/48 -- -- -- (!) 25 -- -- --  10/29/18 1515 (!) 96/54 -- -- -- (!) 31 -- -- --  10/29/18 1500 (!) 89/52 -- -- -- (!) 37 -- -- --  10/29/18 1456 (!) 88/48 -- -- -- (!) 34 -- -- --  10/29/18 1452 (!) 84/52 (!) 104.8 F (40.4 C) Rectal (!) 172 (!) 37 100 % -- --  10/29/18 1445 (!) 84/52 -- -- -- (!) 31 -- -- --  10/29/18 1442 (!) 84/51 -- -- (!) 173  (!) 39 -- 5' 1.5" (1.562 m) 112 lb (50.8 kg)    General:  Alert, in no acute distress.   Eyes: No scleral icterus   ENT:  There were no oropharyngeal lesions.   Neck was without thyromegaly.   Lymphatics:  Negative cervical, supraclavicular or axillary adenopathy.   Respiratory: Coarse rhonchi.  Appears short of breath at times. Cardiovascular: Tachycardic, no murmurs.  Trace bilateral lower extremity edema. GI:  abdomen was soft, flat, nontender, nondistended, without organomegaly.  Musculoskeletal:  no spinal tenderness of palpation of vertebral spine.   Skin exam was without ecchymosis, petechiae.   Neuro exam was nonfocal. Patient was alert and oriented.  Attention was good.   Language was appropriate.  Mood was normal without depression.  Speech was not pressured.  Thought content was not tangential.    LABS:  Lab Results  Component Value Date   WBC 13.0 (H) 10/30/2018   HGB 7.2 (L) 10/30/2018   HCT 22.2 (L) 10/30/2018   PLT 41 (L) 10/30/2018   GLUCOSE 174 (H) 10/30/2018   CHOL 123 10/07/2018   TRIG 286 (H) 10/14/2018   HDL <10 (L) 10/07/2018   LDLCALC NOT CALCULATED 10/07/2018   ALT 23 10/30/2018   AST 25 10/30/2018   NA 140 10/30/2018   K 3.6 10/30/2018   CL 120 (H) 10/30/2018   CREATININE 0.74 10/30/2018   BUN 16 10/30/2018   CO2 14 (L) 10/30/2018   INR 1.9 (H) 10/30/2018   HGBA1C 5.5 08/09/2018    Dg Chest 2 View  Result Date: 10/05/2018 CLINICAL DATA:  Suspected sepsis EXAM: CHEST - 2 VIEW COMPARISON:  10/02/2018, 10/01/2018, 09/19/2018, 05/28/2018, 06/03/2017, 03/11/2013, 10/15/2017, 07/12/2018 FINDINGS: Small right-sided pleural effusion. Underlying reticular interstitial changes suggesting component of chronic disease, however there is interim worsening of bilateral right greater than left airspace disease, suspicious for acute edema or pneumonia superimposed on chronic underlying disease. Stable cardiomediastinal silhouette. No pneumothorax. IMPRESSION: 1.  Interval worsening of bilateral right greater than left airspace disease, suspicious for acute edema or pneumonia superimposed on underlying chronic disease. Small right-sided pleural effusion. Electronically Signed   By: Donavan Foil M.D.   On: 10/05/2018 23:52   Dg Abd 1 View  Result Date: 10/15/2018 CLINICAL DATA:  Check feeding tube placement EXAM: ABDOMEN - 1 VIEW COMPARISON:  10/07/2018 FINDINGS:  Nasogastric catheter has been removed. A weighted feeding catheter is now seen within the mid stomach. Scattered large and small bowel gas is noted. No bony abnormality is seen. IMPRESSION: Feeding catheter within the mid stomach. Electronically Signed   By: Inez Catalina M.D.   On: 10/15/2018 14:05   Dg Abd 1 View  Result Date: 10/07/2018 CLINICAL DATA:  Abdominal distention. OG tube placement. EXAM: ABDOMEN - 1 VIEW COMPARISON:  CT scan of the abdomen dated 08/02/2018 FINDINGS: There is gaseous distention of the stomach. The tip of the OG tube lies in the distal stomach. No dilated large or small bowel. Hepatosplenomegaly. Infiltrates at the lung bases. Bones are normal. IMPRESSION: OG tube tip lies in the distal stomach. Gaseous distention of the stomach. Electronically Signed   By: Lorriane Shire M.D.   On: 10/07/2018 09:58   Ct Head Wo Contrast  Result Date: 10/07/2018 CLINICAL DATA:  Altered mental status, intubation, HIV EXAM: CT HEAD WITHOUT CONTRAST TECHNIQUE: Contiguous axial images were obtained from the base of the skull through the vertex without intravenous contrast. COMPARISON:  None. FINDINGS: Brain: No evidence of acute infarction, hemorrhage, hydrocephalus, extra-axial collection or mass lesion/mass effect. Vascular: No hyperdense vessel or unexpected calcification. Skull: Normal. Negative for fracture or focal lesion. Sinuses/Orbits: No acute finding. Other: None. IMPRESSION: No acute intracranial pathology. Electronically Signed   By: Eddie Candle M.D.   On: 10/07/2018 16:28   Ct Chest  Wo Contrast  Result Date: 10/16/2018 CLINICAL DATA:  Acute respiratory failure EXAM: CT CHEST WITHOUT CONTRAST TECHNIQUE: Multidetector CT imaging of the chest was performed following the standard protocol without IV contrast. COMPARISON:  None. FINDINGS: Cardiovascular: Heart is normal size.  Aorta is normal caliber. Mediastinum/Nodes: Enlarged mediastinal lymph nodes. Index right paratracheal node has a short axis diameter of 11 mm. These are stable since prior study. No axillary adenopathy. Difficult to assess the hila without intravenous contrast. Lungs/Pleura: Extensive interstitial thickening, ground-glass opacities. More confluent consolidation seen in the left upper lobe and both lower lobes. Trace bilateral effusions. Upper Abdomen: Imaging into the upper abdomen shows no acute findings. Probable splenomegaly. The spleen is not imaged in its entirety. Musculoskeletal: Chest wall soft tissues are unremarkable. No acute bony abnormality. IMPRESSION: Diffuse ground-glass airspace disease throughout the lungs with interlobular septal thickening. More confluent consolidation in the left upper lobe and both superior segments of the lower lobes. This is similar to prior study and presumably reflects atypical infection there is mediastinal adenopathy which is also stable. Sarcoidosis could have a similar appearance in the appropriate clinical setting. Trace bilateral effusions. Probable splenomegaly. The spleen is not imaged in its entirety but appears enlarged in the visualized upper abdomen. Recommend clinical correlation. Electronically Signed   By: Rolm Baptise M.D.   On: 10/16/2018 19:05   US Renal  Result Date: 10/09/2018 CLINICAL DATA:  Acute kidney injury. EXAM: RENAL / URINARY TRACT ULTRASOUND COMPLETE COMPARISON:  08/08/2018 FINDINGS: Right Kidney: Renal measurements: 11.2 x 4.4 x 5.8 cm = volume: 149 mL. Echogenicity is diffusely increased. No hydronephrosis. Left Kidney: Renal measurements: 12.4 x  5.5 x 4.6 cm = volume: 163 mL. Diffusely increased echogenicity without hydronephrosis. Bladder: Decompressed by Foley catheter. Note: Small to moderate bilateral pleural effusions noted. IMPRESSION: 1. Bilateral echogenic kidneys without hydronephrosis. Imaging features compatible with medical renal disease. 2. Bilateral pleural effusions. Electronically Signed   By: Misty Stanley M.D.   On: 10/09/2018 20:03   Dg Chest Port 1 View  Result Date: 10/30/2018  CLINICAL DATA:  Endotracheal tube. EXAM: PORTABLE CHEST 1 VIEW COMPARISON:  Radiograph of October 29, 2018. FINDINGS: The heart size and mediastinal contours are within normal limits. No pneumothorax or significant pleural effusion is noted. Increased bilateral diffuse interstitial densities are noted which is concerning for acute inflammation or edema . The visualized skeletal structures are unremarkable. IMPRESSION: Increased bilateral diffuse interstitial densities are noted concerning for acute edema or inflammation. Electronically Signed   By: Marijo Conception M.D.   On: 10/30/2018 07:25   Dg Chest Port 1 View  Result Date: 10/29/2018 CLINICAL DATA:  Sepsis. EXAM: PORTABLE CHEST 1 VIEW COMPARISON:  Single-view of the chest 10/15/2018 and 07/04/2018. PA and lateral chest 08/12/2018. CT chest 10/16/2018. FINDINGS: Hazy bilateral pulmonary opacities appear unchanged compared to the 07/04/2018 examination. No consolidative process, pneumothorax or effusion. Heart size is normal. IMPRESSION: Chronic hazy pulmonary opacities may be secondary to chronic interstitial lung disease or sarcoidosis. No acute abnormality is identified. Electronically Signed   By: Inge Rise M.D.   On: 10/29/2018 15:32   Dg Chest Port 1 View  Result Date: 10/15/2018 CLINICAL DATA:  Hypoxia. EXAM: PORTABLE CHEST 1 VIEW COMPARISON:  Radiograph of October 14, 2018. FINDINGS: The heart size and mediastinal contours are within normal limits. Endotracheal and nasogastric tubes  have been removed. Bilateral internal jugular catheters are unchanged in position. Stable bilateral bibasilar opacities are noted concerning for possible atelectasis or inflammation. The visualized skeletal structures are unremarkable. IMPRESSION: Endotracheal and nasogastric tubes have been removed. Stable bilateral lung opacities as described above. Electronically Signed   By: Marijo Conception M.D.   On: 10/15/2018 07:08   Dg Chest Port 1 View  Result Date: 10/14/2018 CLINICAL DATA:  resp failure EXAM: PORTABLE CHEST 1 VIEW COMPARISON:  10/13/2018 FINDINGS: Endotracheal tube is in place, tip 4.8 centimeters above the carina. RIGHT IJ central line tip overlies the superior vena cava. LEFT IJ central line tip overlies the superior vena cava. Patient is rotated towards the LEFT. Nasogastric tube tip is off the image. The heart is normal in size. Diffuse ground-glass opacities are identified throughout the lungs bilaterally without significant change. No new consolidation. IMPRESSION: Stable bilateral pulmonary infiltrates. Electronically Signed   By: Nolon Nations M.D.   On: 10/14/2018 09:03   Dg Chest Port 1 View  Result Date: 10/13/2018 CLINICAL DATA:  Acute kidney injury secondary to shock and hypoperfusion. Intubated patient. EXAM: PORTABLE CHEST 1 VIEW COMPARISON:  Single-view of the chest 10/12/2018 and 10/10/2018. FINDINGS: Support tubes and lines are unchanged and project in good position. Diffuse hazy bilateral airspace disease has improved. No pneumothorax or pleural fluid. Heart size is normal. IMPRESSION: No change in support apparatus. Improved bilateral airspace disease. Electronically Signed   By: Inge Rise M.D.   On: 10/13/2018 08:33   Dg Chest Port 1 View  Result Date: 10/12/2018 CLINICAL DATA:  Acute respiratory failure EXAM: PORTABLE CHEST 1 VIEW COMPARISON:  Two days ago FINDINGS: Endotracheal tube tip between the clavicular heads and carina. Bilateral central line in the IJ with  tips at the SVC. The orogastric tube reaches the stomach. Diffuse hazy opacification of the lungs but significantly improved aeration from prior. No visible pleural fluid or air leak. IMPRESSION: 1. Unremarkable hardware positioning. 2. Diffuse airspace disease that is improved from 2 days ago. Electronically Signed   By: Monte Fantasia M.D.   On: 10/12/2018 08:16   Dg Chest Port 1 View  Result Date: 10/10/2018 CLINICAL DATA:  ET  positioning EXAM: PORTABLE CHEST 1 VIEW COMPARISON:  Radiograph October 08, 2018 FINDINGS: Endotracheal tube is positioned in the mid trachea approximately 4 cm from the carina right IJ catheter terminates at the superior cavoatrial junction. Left IJ dual lumen catheter terminates at the caval-brachiocephalic confluence. Transesophageal tube tip and side port distal to the GE junction. Pacer pad overlies the left chest. Persistent bilateral airspace opacities demonstrate some interval clearing in the upper lobes. Basilar opacities are similar to prior. Cardiomediastinal silhouette is largely obscured by overlying opacity. No visible pneumothorax or discernible effusion. No acute osseous or soft tissue abnormality. IMPRESSION: 1. Satisfactory positioning of the lines and tubes, as above. 2. Persistent bilateral airspace opacities with some interval clearing in the upper lobes. Electronically Signed   By: Lovena Le M.D.   On: 10/10/2018 05:27   Dg Chest Port 1 View  Result Date: 10/09/2018 CLINICAL DATA:  ARDS EXAM: PORTABLE CHEST 1 VIEW COMPARISON:  Radiograph 10/08/2018 FINDINGS: Pacer pads overlie the chest. Right IJ catheter tip terminates at the level of the superior cavoatrial junction. A left IJ dual lumen catheter tip terminates near the brachiocephalic-caval confluence. Endotracheal tube is positioned in the mid trachea, 3.8 cm from the carina. A transesophageal tube tip and side port are distal to the GE junction. There are diffuse consolidative opacities throughout both  lungs on a background of more hazy interstitial opacity. Suspect small right effusion. No pneumothorax. Cardiac silhouette is largely obscured by the overlying opacities. IMPRESSION: Extensive bilateral airspace opacities, similar in extent to most recent comparison. Stable, satisfactory positioning of lines and tubes, as above Electronically Signed   By: Lovena Le M.D.   On: 10/09/2018 05:40   Dg Chest Port 1 View  Result Date: 10/08/2018 CLINICAL DATA:  CVL placement EXAM: PORTABLE CHEST 1 VIEW COMPARISON:  10/08/2018, 10/07/2018, 10/05/2018 FINDINGS: Endotracheal tube tip is about 4.3 cm superior to the carina. Esophageal tube tip below the diaphragm but non included. Right IJ central venous catheter tip over the SVC. Left IJ central venous catheter tip over the upper SVC. No left pneumothorax. Extensive bilateral interstitial and alveolar disease. Obscured cardiomediastinal silhouette. Probable small effusions. IMPRESSION: 1. New left IJ central venous catheter with tip over the SVC. No pneumothorax. Support lines and tubes otherwise grossly stable in position 2. No significant change in extensive interstitial and alveolar disease bilaterally. Electronically Signed   By: Donavan Foil M.D.   On: 10/08/2018 14:06   Dg Chest Port 1 View  Result Date: 10/08/2018 CLINICAL DATA:  Check endotracheal tube placement EXAM: PORTABLE CHEST 1 VIEW COMPARISON:  10/07/2018 FINDINGS: Cardiac shadow is stable. Right jugular central line, gastric catheter and endotracheal tube are again seen. The endotracheal tube now lies 0.6 cm above the carina slightly withdrawn when compared with the prior exam. Persistent bilateral airspace opacities are noted worst in the right lung base stable from the previous day. Small right-sided effusion is noted superiorly. Bony abnormality is noted. IMPRESSION: Tubes and lines as described above. Stable airspace opacities bilaterally. Electronically Signed   By: Inez Catalina M.D.   On:  10/08/2018 08:43   Portable Chest X-ray  Result Date: 10/07/2018 CLINICAL DATA:  Intubation EXAM: PORTABLE CHEST 1 VIEW COMPARISON:  October 06, 2018 FINDINGS: ET tube is 3 cm above the level of carina. Enteric tube is seen coursing below the diaphragm. A right-sided PICC is seen at the superior cavoatrial junction. Patchy airspace opacities are seen throughout both lungs, right greater than left. The cardiomediastinal silhouette is  unchanged. IMPRESSION: ET tube 3 cm above the carina. Unchanged patchy airspace opacities, right greater than left. Electronically Signed   By: Prudencio Pair M.D.   On: 10/07/2018 10:05   Dg Chest Port 1 View  Result Date: 10/06/2018 CLINICAL DATA:  33 year old female with centralized placement. EXAM: PORTABLE CHEST 1 VIEW COMPARISON:  Chest radiograph dated 10/05/2018 FINDINGS: Right IJ central venous line with tip over the cavoatrial junction. No pneumothorax. Bilateral airspace opacities similar to prior radiograph. There is a small right pleural effusion. No acute osseous pathology. IMPRESSION: 1. Interval placement of a right IJ central venous line with tip in the region of the cavoatrial junction. No pneumothorax. 2. No significant interval change in the bilateral airspace opacities. Electronically Signed   By: Anner Crete M.D.   On: 10/06/2018 04:04   Dg Chest Port 1 View  Result Date: 10/02/2018 CLINICAL DATA:  Tachypnea EXAM: PORTABLE CHEST 1 VIEW COMPARISON:  Yesterday FINDINGS: Generalized reticular pulmonary opacity, present on multiple prior studies. No pleural fluid or pneumothorax. Normal heart size. IMPRESSION: Chronic reticular pulmonary opacity without acute superimposed finding. Electronically Signed   By: Monte Fantasia M.D.   On: 10/02/2018 05:45   Dg Chest Port 1 View  Result Date: 10/01/2018 CLINICAL DATA:  Cough, pancytopenia EXAM: PORTABLE CHEST 1 VIEW COMPARISON:  09/19/2018 FINDINGS: Stable cardiomediastinal contours. Extensive, diffuse  reticulonodular opacities throughout both lungs, markedly progressed from prior. No pleural effusion. No pneumothorax. Osseous structures intact. IMPRESSION: Extensive reticulonodular opacities throughout both lungs suggestive of an atypical infection in an immunocompromised patient. Electronically Signed   By: Davina Poke M.D.   On: 10/01/2018 18:38   Dg Swallowing Func-speech Pathology  Result Date: 10/18/2018 Objective Swallowing Evaluation: Type of Study: MBS-Modified Barium Swallow Study  Patient Details Name: Chieko Neises MRN: [id number] Date of Birth: 07-18-1985 Today's Date: 10/18/2018 Time: SLP Start Time (ACUTE ONLY): 1000 -SLP Stop Time (ACUTE ONLY): 4158 SLP Time Calculation (min) (ACUTE ONLY): 15 min Past Medical History: Past Medical History: Diagnosis Date  Acute hyponatremia 06/03/2017  Anemia   CAP (community acquired pneumonia) 06/03/2017  Facial dermatitis 06/03/2017  HIV (human immunodeficiency virus infection) ([place])   diagnosed in April 2019  Pleurisy 06/03/2017  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 ([place])   Thrush of mouth and esophagus ([place])   UTI (urinary tract infection)  Past Surgical History: Past Surgical History: Procedure Laterality Date  BIOPSY  07/12/2018  Procedure: BIOPSY;  Surgeon: Laurin Coder, MD;  Location: [facility];  Service: Endoscopy;;  BRONCHIAL WASHINGS  07/12/2018  Procedure: BRONCHIAL WASHINGS;  Surgeon: Laurin Coder, MD;  Location: [facility];  Service: Endoscopy;;  ENDOBRONCHIAL ULTRASOUND N/A 07/12/2018  Procedure: ENDOBRONCHIAL ULTRASOUND;  Surgeon: Laurin Coder, MD;  Location: [facility];  Service: Endoscopy;  Laterality: N/A;  FINE NEEDLE ASPIRATION BIOPSY  07/12/2018  Procedure: FINE NEEDLE ASPIRATION BIOPSY;  Surgeon: Laurin Coder, MD;  Location: [facility];  Service: Endoscopy;;  NO PAST SURGERIES    VIDEO BRONCHOSCOPY N/A 07/12/2018  Procedure: VIDEO BRONCHOSCOPY WITHOUT FLUORO;  Surgeon: Laurin Coder, MD;  Location: [facility];  Service: Endoscopy;  Laterality: N/A; HPI: 33 yo F with history of HIV/AIDS admitted on 8/29 with progressive shortness of breath. History of multiple admissions over the last year for waxing / waning pulmonary infiltrates. Patient decompensated on 8/31 requiring initiation of mechanical intubation and later pressure support and CRRT, was extubated 10/14/2018.  Swallow eval ordered.  Pt denies h/o dysphagia.  .Pt was taken  off CRRT yesterday.  Today is tachycardic.  She reports she is receiving ice chips and her spouse gave her "lots of them" yesterday.  Pt had a small bore feeding tube for nutrition, however, this was found out thursday 10/17/2018 night. Pt to have MBS today.  Subjective: Pt seen in radiology for MBS. RN in attendance. Assessment / Plan / Recommendation CHL IP CLINICAL IMPRESSIONS 10/18/2018 Clinical Impression Pt presents with essentially normal oropharyngeal swallow function. One episode of very trace flash penetration was noted with large straw sips, however, penetrate cleared spontaneously, and no other episodes of penetration were noted. Otherwise, normal swallow without oral residue, post-swallow pharyngeal residue, or aspiration noted. Recommend beginning regular texture solids and thin liquids, meds whole with liquid. Safe swallow precautions posted at [facility] and reviewed with pt and her mother. SLP will follow for diet tolerance and education.  SLP Visit Diagnosis Dysphagia, oropharyngeal phase (R13.12)     Impact on safety and function Mild aspiration risk   CHL IP TREATMENT RECOMMENDATION 10/18/2018 Treatment Recommendations Therapy as outlined in treatment plan below   Prognosis 10/15/2018 Prognosis for Safe Diet Advancement Good     CHL IP DIET RECOMMENDATION 10/18/2018 SLP Diet Recommendations Regular solids Liquid Administration via Straw;Cup Medication Administration Whole meds with liquid Compensations Slow rate;Small sips/bites;Minimize environmental  distractions Postural Changes Seated upright at 90 degrees   CHL IP OTHER RECOMMENDATIONS 10/18/2018 Recommended Consults -- Oral Care Recommendations Oral care QID Other Recommendations --   CHL IP FOLLOW UP RECOMMENDATIONS 10/18/2018 Follow up Recommendations None   CHL IP FREQUENCY AND DURATION 10/18/2018 Speech Therapy Frequency (ACUTE ONLY) min 1 x/week Treatment Duration 1 week;2 weeks      CHL IP ORAL PHASE 10/18/2018 Oral Phase WFL  CHL IP PHARYNGEAL PHASE 10/18/2018 Pharyngeal Phase Impaired Pharyngeal- Nectar Cup WFL Pharyngeal- Thin Cup Reduced airway/laryngeal closure;Penetration/Aspiration during swallow Pharyngeal Material enters airway, remains ABOVE vocal cords then ejected out;Material does not enter airway Pharyngeal- Puree WFL Pharyngeal- Regular WFL  CHL IP CERVICAL ESOPHAGEAL PHASE 10/18/2018 Cervical Esophageal Phase [facility], CCC-SLP Speech Language Pathologist [phone] [name] 10/18/2018, 10:40 AM              Vas [place] Lower Extremity Venous (dvt)  Result Date: 10/07/2018  Lower Venous Study Indications: SOB, and Edema.  Risk Factors: Advanced HIV/AIDS. Limitations: Lights on in room, patient newly vented. Comparison Study: Prior study from 06/20/17 is available for comparison Performing Technologist: Sharion Dove RVS  Examination Guidelines: A complete evaluation includes B-mode imaging, spectral Doppler, color Doppler, and power Doppler as needed of all accessible portions of each vessel. Bilateral testing is considered an integral part of a complete examination. Limited examinations for reoccurring indications may be performed as noted.  +---------+---------------+---------+-----------+----------+--------------+  RIGHT     Compressibility Phasicity Spontaneity Properties Thrombus Aging  +---------+---------------+---------+-----------+----------+--------------+  CFV       Full            Yes       Yes                                     +---------+---------------+---------+-----------+----------+--------------+  SFJ       Full                                                             +---------+---------------+---------+-----------+----------+--------------+  FV Prox   Full                                                             +---------+---------------+---------+-----------+----------+--------------+  FV Mid    Full                                                             +---------+---------------+---------+-----------+----------+--------------+  FV Distal Full                                                             +---------+---------------+---------+-----------+----------+--------------+  PFV       Full                                                             +---------+---------------+---------+-----------+----------+--------------+  POP       Full            Yes       Yes                                    +---------+---------------+---------+-----------+----------+--------------+  PTV       Full                                                             +---------+---------------+---------+-----------+----------+--------------+  PERO      Full                                                             +---------+---------------+---------+-----------+----------+--------------+   +---------+---------------+---------+-----------+----------+--------------+  LEFT      Compressibility Phasicity Spontaneity Properties Thrombus Aging  +---------+---------------+---------+-----------+----------+--------------+  CFV       Full            Yes       Yes                                    +---------+---------------+---------+-----------+----------+--------------+  SFJ       Full                                                             +---------+---------------+---------+-----------+----------+--------------+  FV Prox   Full                                                              +---------+---------------+---------+-----------+----------+--------------+  FV Mid    Full                                                             +---------+---------------+---------+-----------+----------+--------------+  FV Distal Full                                                             +---------+---------------+---------+-----------+----------+--------------+  PFV       Full                                                             +---------+---------------+---------+-----------+----------+--------------+  POP       Full            Yes       Yes                                    +---------+---------------+---------+-----------+----------+--------------+  PTV       Full                                                             +---------+---------------+---------+-----------+----------+--------------+  PERO                                                       Not visualized  +---------+---------------+---------+-----------+----------+--------------+     Summary: Right: Findings appear essentially unchanged compared to previous examination. There is no evidence of deep vein thrombosis in the lower extremity. Left: Findings appear essentially unchanged compared to previous examination. There is no evidence of deep vein thrombosis in the lower extremity. However, portions of this examination were limited- see technologist comments above.  *See table(s) above for measurements and observations. Electronically signed by Monica Martinez MD on 10/07/2018 at 6:03:03 PM.    Final    Vas [place] Upper Extremity Venous Duplex  Result Date: 10/15/2018 UPPER VENOUS STUDY  Indications: Edema Limitations: Bandages and Edema and size of the veins. Comparison Study: No previous study available Performing Technologist: Toma Copier RVS  Examination Guidelines: A complete evaluation includes B-mode imaging, spectral Doppler, color Doppler, and power Doppler  as needed of all accessible portions of each  vessel. Bilateral testing is considered an integral part of a complete examination. Limited examinations for reoccurring indications may be performed as noted.  Right Findings: +----------+------------+---------+-----------+----------+---------------------+  RIGHT      Compressible Phasicity Spontaneous Properties        Summary         +----------+------------+---------+-----------+----------+---------------------+  IJV                                                      Not visualized due to                                                               dialysis access     +----------+------------+---------+-----------+----------+---------------------+  Subclavian     Full        Yes        Yes                                       +----------+------------+---------+-----------+----------+---------------------+  Axillary       Full        Yes        Yes                                       +----------+------------+---------+-----------+----------+---------------------+  Brachial       Full        Yes        Yes                                       +----------+------------+---------+-----------+----------+---------------------+  Radial         Full                                                             +----------+------------+---------+-----------+----------+---------------------+  Ulnar          Full                                                             +----------+------------+---------+-----------+----------+---------------------+  Cephalic                                                 Not visualized due to  edema and size of the                                                                    vein           +----------+------------+---------+-----------+----------+---------------------+  Basilic                                                  Not visualized due to                                                            the size of  the vein   +----------+------------+---------+-----------+----------+---------------------+ Technically limited due to dialysis access in the neck, size and edema.  Left Findings: +----------+------------+---------+-----------+----------+---------------------+  LEFT       Compressible Phasicity Spontaneous Properties        Summary         +----------+------------+---------+-----------+----------+---------------------+  IJV                                                      Not visualized due to                                                                IV placement       +----------+------------+---------+-----------+----------+---------------------+  Subclavian                                               Not visualized due to                                                               bandages for IV                                                                     placement        +----------+------------+---------+-----------+----------+---------------------+  Axillary       Full        Yes  Yes                                       +----------+------------+---------+-----------+----------+---------------------+  Brachial       Full        Yes        Yes                                       +----------+------------+---------+-----------+----------+---------------------+  Radial         Full                                                             +----------+------------+---------+-----------+----------+---------------------+  Ulnar          Full                                                             +----------+------------+---------+-----------+----------+---------------------+  Cephalic                                                 Not visualize due to                                                                size and edema      +----------+------------+---------+-----------+----------+---------------------+  Basilic                                                  Not  visualized due to                                                               size and edema      +----------+------------+---------+-----------+----------+---------------------+ Technically limited due to bandages, IV placement, size. and edema  Summary:  Right: No evidence of deep vein thrombosis in the upper extremity. However, unable to visualize all veins. See comments listed above. No evidence of superficial vein thrombosis . However, unable to visualize See comments listed above. No evidence of thrombosis in the subclavian. This was a limited study.  Left: No evidence of deep vein thrombosis in the upper extremity. However, unable to visualize all veins. See comments listed avone. No evidence of superficial vein thrombosis . However, unable to visualize all veins. See comments  listed avone. Unable to visualize the subclavian/ See comments listed abone. This was a limited study.  *See table(s) above for measurements and observations.  Diagnosing physician: Servando Snare MD Electronically signed by Servando Snare MD on 10/15/2018 at 1:15:33 PM.    Final    [place] Ekg Site Rite  Result Date: 10/18/2018 If Site Rite image not attached, placement could not be confirmed due to current cardiac rhythm.  US Abdomen Limited Ruq  Result Date: 10/29/2018 CLINICAL DATA:  Fever, concern for sepsis. EXAM: ULTRASOUND ABDOMEN LIMITED RIGHT UPPER QUADRANT COMPARISON:  None. FINDINGS: Gallbladder: No stones. Gallbladder wall is thickened at 6 mm. Negative sonographic Murphy's. Common bile duct: Diameter: Normal caliber, 2 mm Liver: No focal lesion identified. Within normal limits in parenchymal echogenicity. Portal vein is patent on color Doppler imaging with normal direction of blood flow towards the liver. Other: None. IMPRESSION: Thickened gallbladder wall without visible stones or sonographic Murphy sign. This could reflect chronic cholecystitis. Electronically Signed   By: Rolm Baptise M.D.   On: 10/29/2018 20:14    ASSESSMENT AND PLAN:  This is a 34 year old unfortunate African-American female with a very complex medical history and course with HIV AIDS CD4 count less than 50 on ART with recurrent admissions for sepsis and possible PJP with  1. Anemia-transfusion dependent 2. Thrombocytopenia-significant thrombocytopenia currently transfusion dependent 3.  Leukocytosis-with elevated procalcitonin levels and significantly elevated interleukin-6 level suggesting severe inflammation. 4.  PJP recently, recurrent sepsis, etiology of pulmonary infiltrates unclear 5.  HIV/AIDS on Biktarvy. On Atovaquone for PCP prophylaxis and Azithromycin for MAI prophylaxis. 6. Splenomegaly and some lymphadenopathy noted on previous imaging  7.  Acute respiratory failure currently intubated and on a mechanical ventilator. 8. Concern for secondary [place]  Patient with pancytopenia with extensive workup including 2 BM Bx's . Patients blood counts havent had a chance for sustained improvement on account of recurrent sepsis, medication causing BM suppression and cytopenia, HIV/AIDS related dysplastic changes on last BM Bx. She has had improvement in her condition with previous antibiotic treatments and brief use of steroids and PJP treatment. Bone marrow examination has not shown overt infectious etiology, was negative for MAI or other fungal processes. No overt evidence of lymphoma in the bone marrow biopsy. Lymphadenopathy and splenomegaly could be from her HIV AIDS as well as recurrent infections. Blood cultures are negative to date.  Respiratory panel negative.  Chest x-ray with increased bilateral diffuse interstitial densities concerning for acute edema or inflammation. Patient currently admitted for presumably septic shock without clear source.  On IV antibiotics and IV fluids.  Significantly elevated interleukin-6 levels suggest severe inflammatory state.  This could be from recurrent infections or macrophage activation  syndrome related to HIV. Also has elevated ferritin level in the setting of significant transfusion history and from acute inflammation. Triglyceride levels have fluctuated the last ones are elevated. LDH levels have been elevated but this could be in the setting of alternative pulmonary process. She has had very poor nutritional status on account of recurrent admissions sepsis AIDS and other considerations.  This can also play into her bone marrow suppression.  She has had increasing blood CMV titers which infectious disease is aware of but do not feel this is the primary driver of her lung findings or bone marrow suppression.  They have also sent out parvovirus testing to evaluate for this is a sign of bone marrow suppression.  Patient did not respond to IVIG previously to suggest immune cytopenias.  Ferritin and C-reactive protein  were declining prior to discharge during last admission.  Plan -Patient  now with recurrent infection/sepsis. -Renal function remains normal.  Has required CRRT in the past. -Platelets were stable on admission but are now drifting down.  Likely dropping secondary to acute infection/sepsis and medications such as Bactrim and cefepime. -The patient has been on Nplate 5 mcg/kg to help increase her platelets reduce platelet transfusion needs.  We will continue this as an inpatient. -Transfuse platelets for platelet count less than 20,000.  No transfusion is indicated today. -The patient has required intermittent blood transfusions.  Hemoglobin is 7.2 today.  Recommend PRBC transfusion for hemoglobin less than 7.5. -The patient was previously on dexamethasone for treatment of her secondary HL H.  She was discharged home on a dose of 12 mg daily for 1 week and should now be taking dexamethasone 10 mg daily.  I note that she is currently on Solu-Medrol from the PCCM team.  Will defer to them to change from Solu-Medrol dexamethasone if they feel change is  appropriate. -As outlined previously during her last hospital admission, there is concern for secondary [place].  Treatment would require low-dose etoposide.  However, her anemia and thrombocytopenia have been a barrier to starting this.  Additionally, she now has developed recurrent infection/sepsis which makes giving chemotherapy prohibitive.  Will check inflammatory markers including LDH, ferritin, triglycerides, and sed rate. -We continue to recommend transfer to tertiary care facility for management of secondary [place] and other complex care. -CT chest on 9/9- sill with significant infiltrates -- still unclear etiology. There was consider of previous lung biopsy with improving platelets would recommend hospital co-ordinate pulmonary input on this ? Need for lung biopsy. Also would need need rpt CMV titers to monitor this in the setting of high dose steroids. Would need ID input on this prior to start Etoposide and will need continued ID monitoring of CMV titer in the setting of steroids and chemotherapy to determine need for presumptive treatment of CMV since this could flare and cause symptomatic disease with high dose steroids and Etoposide and subsequent cyclosporine as a part of secondayr [place] treatment.  -We will continue to follow -Appreciate assistance from PCCM.  Thank you for this referral.  Mikey Bussing, DNP, AGPCNP-BC, AOCNP  ADDENDUM  .Patient was Personally and independently interviewed, examined and relevant elements of the history of present illness were reviewed in details and an assessment and plan was created. All elements of the patient's history of present illness , assessment and plan were discussed in details with Mikey Bussing, DNP. The above documentation reflects our combined findings assessment and plan.  Patient is well-known from previous admissions.  Very complex patient with recurrent hospitalizations for sepsis and the possibility of secondary HL H.  Readmitted with  high-grade fevers and elevated lactate levels concerning for sepsis in the setting of significant immunosuppression from end-stage AIDS and steroid use and compromised nutrition. On admission her platelets were relatively stable at 52k with a hemoglobin of 8.1 and normal WBC count of 9.4k. We expect this to drop in the setting of sepsis and the use of multiple new antimicrobials including Bactrim. We recommend continuing to taper down her dexamethasone by 2 to 4 mg a week. She will currently be on 10 mg dexamethasone daily or equivalent. We unequivocally recommend transfer to a tertiary care academic center.  Other than gradually tapering down her steroids and weekly Nplate we do not have any additional hematologic recommendations at this time.  Etoposide or cyclosporine addition  at this time would not be recommended from our perspective due to significant issues with her immune suppression cytopenias and risk of worsening sepsis.  These decisions will need to be made in the context of tertiary care academic center.  Sullivan Lone MD MS

## 2018-10-30 NOTE — Progress Notes (Signed)
[facility] Progress Note Patient Name: Lidiya Fossen DOB: 08/27/1985 MRN: [id number]   Date of Service  10/30/2018  HPI/Events of Note  Mg++ = 1.0 and Creatinine = 0.74.   eICU Interventions  Will replace Mg++.      Intervention Category Major Interventions: Electrolyte abnormality - evaluation and management  Sommer,Steven Eugene 10/30/2018, 6:28 AM

## 2018-10-30 NOTE — Consult Note (Signed)
[facility]ious Disease       Reason for Consult: fever    Referring Physician: Dr. Nelda Marseille  Active Problems:   HIV infection ([place])   HCAP (healthcare-associated pneumonia)   Septic shock ([place])   . Chlorhexidine Gluconate Cloth  6 each Topical Daily  . insulin aspart  0-15 Units Subcutaneous Q6H  . methylPREDNISolone (SOLU-MEDROL) injection  60 mg Intravenous Q12H    Recommendations: Continue current care  Except I will stop vancomycin with negative MRSA screen CD4 count (already ordered) Viral load  ? Would a HRCT add anything to the diagnosis of possible sarcoid or other etiology  Assessment: She has a recurrence of her yet undescribed respiratory predominate illness.   She has had multiple admissions this year (7 prior to this one) with essentially the same issue,  Sob, fever, hypotension often requiring pressor support, elevated lactate, thrombocytopenia and bilateral pulmonary findings.  She has been treated multiple times for PJP pneumonia but work up has been negative.  She seems to have responded to steroids (though with multiple other interventions as well) and sent out with steroids.  She has had a bone marrow biopsy x 2, a lung biopsy and no etiology identified.  She has had persistent pulmonary infiltrates and thrombocytopenia.   Only positive cultures is the Staph epi in lungs which I don't suspect is significant  Antibiotics: Cefepime, vancomycin, bactrim, azithromycin  HPI: Krystal Short is a 33 y.o. female with HIV well-controlled on Biktarvy but with persistently low CD4 count with now her 8th admission for sob, hypoxemia, hypotension, lactic acidosis of unknown etiology.  She has a fever at home and was almost 105 here.  Some delirium with the fever.  WBC up at 13.  Resuscitated and on IV steroids as well and improved overnight.  She is now on nasal cannula, alert and feeling better.  She has had a negative work up to date.  CXR now looks much  improved compared to what it has been (independently reviewed).    Review of Systems:  Constitutional: positive for fevers, chills and malaise Respiratory: positive for cough or dyspnea on exertion, negative for sputum or hemoptysis Gastrointestinal: negative for nausea and diarrhea Neurological: negative for gait problems All other systems reviewed and are negative    Past Medical History:  Diagnosis Date  . Acute hyponatremia 06/03/2017  . Anemia   . CAP (community acquired pneumonia) 06/03/2017  . Facial dermatitis 06/03/2017  . HIV (human immunodeficiency virus infection) ([place])    diagnosed in April 2019  . Pleurisy 06/03/2017  . 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 ([place])   . Thrush of mouth and esophagus ([place])   . UTI (urinary tract infection)     Social History   Tobacco Use  . Smoking status: Former Smoker    Packs/day: 0.25    Years: 8.00    Pack years: 2.00    Types: Cigarettes, Cigars    Quit date: 12/25/2016    Years since quitting: 1.8  . Smokeless tobacco: Never Used  Substance Use Topics  . Alcohol use: Yes    Comment: weekend/socially  . Drug use: No    Family History  Problem Relation Age of Onset  . Healthy Father   . Healthy Mother   . Asthma Paternal Grandmother     Allergies  Allergen Reactions  . Heparin Other (See Comments)    Unknown reaction    Physical Exam: Constitutional: alert Vitals:   10/30/18  9381 10/30/18 0900  BP:  111/79  Pulse:  (!) 121  Resp:  (!) 21  Temp: 98 F (36.7 C)   SpO2:  100%   EYES: anicteric ENMT: no thrush Cardiovascular: Cor Tachy Respiratory: some increased respiratory distress, speaking in full sentences; some congestion noted GI: soft Musculoskeletal: no pedal edema noted Skin: molluscum rash on face Hematologic: no cervical lad  Lab Results  Component Value Date   WBC 13.0 (H) 10/30/2018   HGB 7.2 (L) 10/30/2018   HCT 22.2 (L) 10/30/2018   MCV 96.5 10/30/2018   PLT 41 (L)  10/30/2018    Lab Results  Component Value Date   CREATININE 0.74 10/30/2018   BUN 16 10/30/2018   NA 140 10/30/2018   K 3.6 10/30/2018   CL 120 (H) 10/30/2018   CO2 14 (L) 10/30/2018    Lab Results  Component Value Date   ALT 23 10/30/2018   AST 25 10/30/2018   ALKPHOS 216 (H) 10/30/2018     Microbiology: Recent Results (from the past 240 hour(s))  SARS Coronavirus 2 [facility] order, Performed in [facility] hospital lab) Nasopharyngeal Nasopharyngeal Swab     Status: None   Collection Time: 10/29/18  3:36 PM   Specimen: Nasopharyngeal Swab  Result Value Ref Range Status   SARS Coronavirus 2 NEGATIVE NEGATIVE Final    Comment: (NOTE) If result is NEGATIVE SARS-CoV-2 target nucleic acids are NOT DETECTED. The SARS-CoV-2 RNA is generally detectable in upper and lower  respiratory specimens during the acute phase of infection. The lowest  concentration of SARS-CoV-2 viral copies this assay can detect is 250  copies / mL. A negative result does not preclude SARS-CoV-2 infection  and should not be used as the sole basis for treatment or other  patient management decisions.  A negative result may occur with  improper specimen collection / handling, submission of specimen other  than nasopharyngeal swab, presence of viral mutation(s) within the  areas targeted by this assay, and inadequate number of viral copies  (<250 copies / mL). A negative result must be combined with clinical  observations, patient history, and epidemiological information. If result is POSITIVE SARS-CoV-2 target nucleic acids are DETECTED. The SARS-CoV-2 RNA is generally detectable in upper and lower  respiratory specimens dur ing the acute phase of infection.  Positive  results are indicative of active infection with SARS-CoV-2.  Clinical  correlation with patient history and other diagnostic information is  necessary to determine patient infection status.  Positive results do  not rule out bacterial  infection or co-infection with other viruses. If result is PRESUMPTIVE POSTIVE SARS-CoV-2 nucleic acids MAY BE PRESENT.   A presumptive positive result was obtained on the submitted specimen  and confirmed on repeat testing.  While 2019 novel coronavirus  (SARS-CoV-2) nucleic acids may be present in the submitted sample  additional confirmatory testing may be necessary for epidemiological  and / or clinical management purposes  to differentiate between  SARS-CoV-2 and other Sarbecovirus currently known to infect humans.  If clinically indicated additional testing with an alternate test  methodology [phone]) is advised. The SARS-CoV-2 RNA is generally  detectable in upper and lower respiratory sp ecimens during the acute  phase of infection. The expected result is Negative. Fact Sheet for Patients:  StrictlyIdeas.no Fact Sheet for Healthcare Providers: BankingDealers.co.za This test is not yet approved or cleared by the [place] FDA and has been authorized for detection and/or diagnosis of SARS-CoV-2 by FDA under an Emergency Use  Authorization (EUA).  This EUA will remain in effect (meaning this test can be used) for the duration of the COVID-19 declaration under Section 564(b)(1) of the Act, 21 U.S.C. section 360bbb-3(b)(1), unless the authorization is terminated or revoked sooner. Performed at [facility], [place] [address], [place], [place] [address]   Respiratory Panel by PCR     Status: None   Collection Time: 10/29/18  9:09 PM   Specimen: Nasopharyngeal Swab; Respiratory  Result Value Ref Range Status   Adenovirus NOT DETECTED NOT DETECTED Final   Coronavirus 229E NOT DETECTED NOT DETECTED Final    Comment: (NOTE) The Coronavirus on the Respiratory Panel, DOES NOT test for the novel  Coronavirus (2019 nCoV)    Coronavirus HKU1 NOT DETECTED NOT DETECTED Final   Coronavirus NL63 NOT DETECTED NOT DETECTED Final    Coronavirus OC43 NOT DETECTED NOT DETECTED Final   Metapneumovirus NOT DETECTED NOT DETECTED Final   Rhinovirus / Enterovirus NOT DETECTED NOT DETECTED Final   Influenza A NOT DETECTED NOT DETECTED Final   Influenza B NOT DETECTED NOT DETECTED Final   Parainfluenza Virus 1 NOT DETECTED NOT DETECTED Final   Parainfluenza Virus 2 NOT DETECTED NOT DETECTED Final   Parainfluenza Virus 3 NOT DETECTED NOT DETECTED Final   Parainfluenza Virus 4 NOT DETECTED NOT DETECTED Final   Respiratory Syncytial Virus NOT DETECTED NOT DETECTED Final   Bordetella pertussis NOT DETECTED NOT DETECTED Final   Chlamydophila pneumoniae NOT DETECTED NOT DETECTED Final   Mycoplasma pneumoniae NOT DETECTED NOT DETECTED Final    Comment: Performed at [facility], [place]. [address] [place], [place] [address]     W , [place] [facility] Infectious Disease [facility] [email or web address] 10/30/2018, 10:35 AM

## 2018-10-30 NOTE — Progress Notes (Signed)
NAMEJisela Short, MRN:  [id number], DOB:  08-11-1985, LOS: 1 ADMISSION DATE:  10/29/2018, CONSULTATION DATE:  9/22 REFERRING MD: Dr. Stark Jock EDP, CHIEF COMPLAINT: Hypotension  Brief History   33 year old female with HIV/AIDS.  Recently admitted for respiratory failure requiring mechanical ventilation.  She had diffuse pulmonary infiltrates of uncertain etiology.  She was treated with broad-spectrum antibiotics and was mentally discharged home.  Now presenting with fevers and found to be hypotensive and admitted to ICU.  History of present illness   33 year old female with past medical history as below, which is significant for HIV/AIDS, chronic thrombocytopenia, pneumonia secondary to PGP, and oral thrush.  She was recently admitted to [facility] from 8/29 through for respiratory failure secondary to bilateral infiltrates required mechanical ventilation.  BAL was positive for staph epidermidis and there was also concern for [place].  She was treated with empiric antibiotics and steroids.  She is able to be extubated on 9/7.  Course was also complicated by acute kidney injury and thrombocytopenia.  She was discharged home on 9/16.  She then again presented to [facility] emergency department on 9/22.  She had reportedly been doing very well since discharge until 9/22 when she awoke with generalized illness.  She had reportedly been compliant with her discharge medications.  Reason for presentation was generalized weakness.  She denied dyspnea, nausea/vomiting, chest pain, cough, abdominal pain.  Upon arrival to the emergency department she was noted to be hypotensive.  There was of course concern for sepsis she was noted on broad-spectrum antibiotics and treated with IV fluids.  Despite 2 L of IV fluid she remained hypotensive and PCCM was asked to admit.  Past Medical History   has a past medical history of Acute hyponatremia (06/03/2017), Anemia, CAP (community acquired pneumonia) (06/03/2017),  Facial dermatitis (06/03/2017), HIV (human immunodeficiency virus infection) ([place]), Pleurisy (06/03/2017), 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 ([place]), Thrush of mouth and esophagus ([place]), and UTI (urinary tract infection).  Significant Hospital Events   8/30-9/16 admit for respiratory failure 9/22  Admit for shock  Consults:  ID  Procedures:    Significant Diagnostic Tests:  RUQ [place] 9/22 > changes consistent with gallbladder wall thickening  Micro Data:  Blood 9/22 > Urine 9/22 > Sputum 9/22 > 10/29/2018 RVP >>negative Antimicrobials:  Cefepime 9/22 > Vancomycin 9/22 > Azithromycin 9/22 > Bactrim 9/22 >  Interim history/subjective:  Awake alert no acute distress she is off vasopressor support  Objective   Blood pressure 111/79, pulse (!) 121, temperature 98 F (36.7 C), temperature source Oral, resp. rate (!) 21, height 5' 1.5" (1.562 m), weight 49.9 kg, SpO2 100 %.        Intake/Output Summary (Last 24 hours) at 10/30/2018 0914 Last data filed at 10/30/2018 0636 Gross per 24 hour  Intake 2868.81 ml  Output 525 ml  Net 2343.81 ml   Filed Weights   10/29/18 1442 10/30/18 0500  Weight: 50.8 kg 49.9 kg    Examination: General: Awake and alert frail-appearing female HEENT: Hoarse voice noted.  No JVD or lymphadenopathy is appreciated Neuro: Grossly intact moves all extremities follows commands. CV: Heart sounds are regular regular rate rhythm PULM: Diminished in the bases coarse rhonchi bilaterally GI: soft, bsx4 active  Extremities: warm/dry, 1+ edema  Skin: no rashes or lesions   Resolved Hospital Problem list     Assessment & Plan:   Shock: presumably septic +/- hypovolemic, however, no clear source. She is compromised secondary to HIV/AIDS  with recent CD4 count 41. Concern for PJP. BAL was negative on last admission and was positive for staph epidermidis.  Monitor to intensive care Continue antimicrobial therapy Follow culture data Fluid  resuscitation Vasopressors as needed Monitor lactic acid will repeat x1 10/30/2018  HIV/AIDS: most recent CD4 58 ID consult CD4 is pending  Scleral icterus LFTs improved Right upper quadrant ultrasound reveals thickened gallbladder wall without visible stones or sonographic Murphy sign.  Could reflect chronic cholecystitis  Anemia Thrombocytopenia - both at recent baseline Continue to monitor CBC She is followed by hematology they were reconsulted on 10/30/2018 she is followed by Dr. Irene Limbo.   Best practice:  Diet: Start heart healthy diet Pain/Anxiety/Delirium protocol (if indicated): NA VAP protocol (if indicated): NA DVT prophylaxis: SDCs GI prophylaxis: NA Glucose control: NA Mobility: BR Code Status: FULL Family Communication: patient 10/30/2018 patient updated at bedside Disposition: ICU  Labs   CBC: Recent Labs  Lab 10/29/18 1548 10/30/18 0502  WBC 9.4 13.0*  NEUTROABS 8.6*  --   HGB 8.1* 7.2*  HCT 24.9* 22.2*  MCV 96.5 96.5  PLT 52* 41*    Basic Metabolic Panel: Recent Labs  Lab 10/29/18 1505 10/30/18 0502  NA 133* 140  K 4.8 3.6  CL 104 120*  CO2 16* 14*  GLUCOSE 119* 174*  BUN 18 16  CREATININE 1.07* 0.74  CALCIUM 8.5* 6.0*  MG  --  1.0*  PHOS  --  3.6   GFR: Estimated Creatinine Clearance: 78.8 mL/min (by C-G formula based on SCr of 0.74 mg/dL). Recent Labs  Lab 10/29/18 1505 10/29/18 1548 10/29/18 2105 10/30/18 0502  PROCALCITON  --   --   --  >150.00  WBC  --  9.4  --  13.0*  LATICACIDVEN 6.4*  --  2.2* 4.7*    Liver Function Tests: Recent Labs  Lab 10/29/18 1505 10/30/18 0502  AST 63* 25  ALT 33 23  ALKPHOS 390* 216*  BILITOT 4.6* 2.3*  PROT 6.4* 4.7*  ALBUMIN 2.4* 1.5*   No results for input(s): LIPASE, AMYLASE in the last 168 hours. No results for input(s): AMMONIA in the last 168 hours.  ABG    Component Value Date/Time   PHART 7.480 (H) 10/14/2018 2230   PCO2ART 35.2 10/14/2018 2230   PO2ART 467 (H) 10/14/2018  2230   HCO3 25.9 10/14/2018 2230   TCO2 15 (L) 10/06/2018 0300   ACIDBASEDEF 10.9 (H) 10/08/2018 1139   O2SAT 100.0 10/14/2018 2230     Coagulation Profile: Recent Labs  Lab 10/29/18 1505 10/30/18 0502  INR 1.5* 1.9*    Cardiac Enzymes: No results for input(s): CKTOTAL, CKMB, CKMBINDEX, TROPONINI in the last 168 hours.  HbA1C: Hgb A1c MFr Bld  Date/Time Value Ref Range Status  08/09/2018 02:28 AM 5.5 4.8 - 5.6 % Final    Comment:    (NOTE) Pre diabetes:          5.7%-6.4% Diabetes:              >6.4% Glycemic control for   <7.0% adults with diabetes   08/02/2018 06:46 PM 4.7 (L) 4.8 - 5.6 % Final    Comment:    (NOTE) Pre diabetes:          5.7%-6.4% Diabetes:              >6.4% Glycemic control for   <7.0% adults with diabetes     CBG: Recent Labs  Lab 10/29/18 2358  GLUCAP 218*  Critical care time: 45 mins     Richardson Landry  ACNP Maryanna Shape PCCM Pager [phone] till 1 pm If no answer page [phone] 10/30/2018, 9:14 AM

## 2018-10-30 NOTE — Progress Notes (Signed)
[facility] ADULT ICU REPLACEMENT PROTOCOL FOR AM LAB REPLACEMENT ONLY  The patient does apply for the [facility] Adult ICU Electrolyte Replacment Protocol based on the criteria listed below:   1. Is GFR >/= 40 ml/min? Yes.    Patient's GFR today is >60 2. Is urine output >/= 0.5 ml/kg/hr for the last 6 hours? Yes.   Patient's UOP is 1.7 ml/kg/hr 3. Is BUN < 60 mg/dL? Yes.    Patient's BUN today is 16 4. Abnormal electrolyte(s): K-3.6 5. Ordered repletion with: per protocol 6. If a panic level lab has been reported, has the CCM MD in charge been notified? Yes.  .   Physician:  Dr. Terrill Mohr, Philis Nettle 10/30/2018 6:21 AM

## 2018-10-31 ENCOUNTER — Inpatient Hospital Stay (HOSPITAL_COMMUNITY): Payer: 59

## 2018-10-31 ENCOUNTER — Inpatient Hospital Stay: Payer: 59 | Admitting: Pulmonary Disease

## 2018-10-31 DIAGNOSIS — R0902 Hypoxemia: Secondary | ICD-10-CM

## 2018-10-31 DIAGNOSIS — D761 Hemophagocytic lymphohistiocytosis: Secondary | ICD-10-CM

## 2018-10-31 DIAGNOSIS — B20 Human immunodeficiency virus [HIV] disease: Secondary | ICD-10-CM

## 2018-10-31 LAB — HELPER T-LYMPH-CD4 (ARMC ONLY)
% CD 4 Pos. Lymph.: 2.2 % — ABNORMAL LOW (ref 30.8–58.5)
Absolute CD 4 Helper: 26 /uL — ABNORMAL LOW (ref 359–1519)
Basophils Absolute: 0 10*3/uL (ref 0.0–0.2)
Basos: 0 %
EOS (ABSOLUTE): 0 10*3/uL (ref 0.0–0.4)
Eos: 0 %
Hematocrit: 20.9 % — ABNORMAL LOW (ref 34.0–46.6)
Hemoglobin: 7.4 g/dL — ABNORMAL LOW (ref 11.1–15.9)
Lymphocytes Absolute: 1.2 10*3/uL (ref 0.7–3.1)
Lymphs: 9 %
MCH: 31 pg (ref 26.6–33.0)
MCHC: 35.4 g/dL (ref 31.5–35.7)
MCV: 87 fL (ref 79–97)
Monocytes Absolute: 0.6 10*3/uL (ref 0.1–0.9)
Monocytes: 5 %
NRBC: 1 % — ABNORMAL HIGH (ref 0–0)
Neutrophils Absolute: 10.8 10*3/uL — ABNORMAL HIGH (ref 1.4–7.0)
Neutrophils: 84 %
Platelets: 41 10*3/uL — ABNORMAL LOW (ref 150–450)
RBC: 2.39 x10E6/uL — ABNORMAL LOW (ref 3.77–5.28)
RDW: 18.3 % — ABNORMAL HIGH (ref 11.7–15.4)
WBC: 12.9 10*3/uL — ABNORMAL HIGH (ref 3.4–10.8)

## 2018-10-31 LAB — GLUCOSE, CAPILLARY
Glucose-Capillary: 136 mg/dL — ABNORMAL HIGH (ref 70–99)
Glucose-Capillary: 77 mg/dL (ref 70–99)
Glucose-Capillary: 82 mg/dL (ref 70–99)
Glucose-Capillary: 83 mg/dL (ref 70–99)

## 2018-10-31 LAB — BASIC METABOLIC PANEL
Anion gap: 12 (ref 5–15)
BUN: 15 mg/dL (ref 6–20)
CO2: 14 mmol/L — ABNORMAL LOW (ref 22–32)
Calcium: 7.5 mg/dL — ABNORMAL LOW (ref 8.9–10.3)
Chloride: 108 mmol/L (ref 98–111)
Creatinine, Ser: 0.77 mg/dL (ref 0.44–1.00)
GFR calc Af Amer: >60 mL/min (ref >60)
GFR calc non Af Amer: >60 mL/min (ref >60)
Glucose, Bld: 181 mg/dL — ABNORMAL HIGH (ref 70–99)
Potassium: 4.3 mmol/L (ref 3.5–5.1)
Sodium: 134 mmol/L — ABNORMAL LOW (ref 135–145)

## 2018-10-31 LAB — IMMATURE CELLS: Metamyelocytes: 2 % — ABNORMAL HIGH (ref 0–0)

## 2018-10-31 LAB — MAGNESIUM: Magnesium: 1.8 mg/dL (ref 1.7–2.4)

## 2018-10-31 LAB — LACTIC ACID, PLASMA: Lactic Acid, Venous: 3.9 mmol/L (ref 0.5–1.9)

## 2018-10-31 LAB — PHOSPHORUS: Phosphorus: 3.1 mg/dL (ref 2.5–4.6)

## 2018-10-31 MED ORDER — SULFAMETHOXAZOLE-TRIMETHOPRIM 400-80 MG PO TABS
1.0000 | ORAL_TABLET | Freq: Every day | ORAL | Status: DC
  Administered 2018-11-01: 1 via ORAL
  Filled 2018-10-31 (×2): qty 1

## 2018-10-31 MED ORDER — DEXAMETHASONE 4 MG PO TABS
10.0000 mg | ORAL_TABLET | Freq: Every day | ORAL | Status: DC
  Administered 2018-10-31 – 2018-11-01 (×2): 10 mg via ORAL
  Filled 2018-10-31 (×2): qty 3

## 2018-10-31 MED ORDER — BICTEGRAVIR-EMTRICITAB-TENOFOV 50-200-25 MG PO TABS
1.0000 | ORAL_TABLET | Freq: Every day | ORAL | Status: DC
  Administered 2018-10-31 – 2018-11-01 (×2): 1 via ORAL
  Filled 2018-10-31 (×3): qty 1

## 2018-10-31 MED FILL — BIKTARVY 50-200-25 MG TABS: 50-200-25 | 30 days supply | Qty: 30 | Fill #3

## 2018-10-31 NOTE — Progress Notes (Signed)
NAMEDaylene Short, MRN:  [id number], DOB:  [birth year], LOS: 2 ADMISSION DATE:  10/29/2018, CONSULTATION DATE:  9/22 REFERRING MD: Dr. Stark Short EDP, CHIEF COMPLAINT: Hypotension  Brief History   33 year old female with HIV/AIDS.  Recently admitted for respiratory failure requiring mechanical ventilation.  She had diffuse pulmonary infiltrates of uncertain etiology.  She was treated with broad-spectrum antibiotics and was mentally discharged home.  Now presenting with fevers and found to be hypotensive and admitted to ICU.  History of present illness   33 year old female with past medical history as below, which is significant for HIV/AIDS, chronic thrombocytopenia, pneumonia secondary to PGP, and oral thrush.  She was recently admitted to [facility] from 8/29 through for respiratory failure secondary to bilateral infiltrates required mechanical ventilation.  BAL was positive for staph epidermidis and there was also concern for [place].  She was treated with empiric antibiotics and steroids.  She is able to be extubated on 9/7.  Course was also complicated by acute kidney injury and thrombocytopenia.  She was discharged home on 9/16.  She then again presented to [facility] emergency department on 9/22.  She had reportedly been doing very well since discharge until 9/22 when she awoke with generalized illness.  She had reportedly been compliant with her discharge medications.  Reason for presentation was generalized weakness.  She denied dyspnea, nausea/vomiting, chest pain, cough, abdominal pain.  Upon arrival to the emergency department she was noted to be hypotensive.  There was of course concern for sepsis she was noted on broad-spectrum antibiotics and treated with IV fluids.  Despite 2 L of IV fluid she remained hypotensive and PCCM was asked to admit.  Past Medical History   has a past medical history of Acute hyponatremia (06/03/2017), Anemia, CAP (community acquired pneumonia) (06/03/2017),  Facial dermatitis (06/03/2017), HIV (human immunodeficiency virus infection) ([place]), Pleurisy (06/03/2017), 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 ([place]), Thrush of mouth and esophagus ([place]), and UTI (urinary tract infection).  Significant Hospital Events   8/30-9/16 admit for respiratory failure 9/22  Admit for shock  Consults:  ID  Procedures:    Significant Diagnostic Tests:  RUQ [place] 9/22 > changes consistent with gallbladder wall thickening  Micro Data:  Blood 9/22 > Urine 9/22 > Sputum 9/22 > 10/29/2018 RVP >>negative Antimicrobials:  Cefepime 9/22 > Vancomycin 9/22 > Azithromycin 9/22 > Bactrim 9/22 >  Interim history/subjective:  Awake alert no acute distress she is off vasopressor support  Objective   Blood pressure 124/82, pulse (!) 120, temperature 98.3 F (36.8 C), temperature source Oral, resp. rate (!) 30, height 5' 1.5" (1.562 m), weight 50.9 kg, SpO2 100 %.        Intake/Output Summary (Last 24 hours) at 10/31/2018 0908 Last data filed at 10/31/2018 0600 Gross per 24 hour  Intake 3522.01 ml  Output 650 ml  Net 2872.01 ml   Filed Weights   10/29/18 1442 10/30/18 0500 10/31/18 0500  Weight: 50.8 kg 49.9 kg 50.9 kg    Examination: General: Awake alert eating no acute distress HEENT no JVD or lymphadenopathy is appreciated Neuro: Grossly intact CV: Heart sounds are regular sinus tach 122 PULM: Tachypneic with sats 100% GI: soft, bsx4 active  Extremities: warm/dry, 1 edema  Skin: no rashes or lesions    Resolved Hospital Problem list     Assessment & Plan:   Shock: presumably septic +/- hypovolemic, however, no clear source. She is compromised secondary to HIV/AIDS with recent CD4 count 41. Concern  for PJP. BAL was negative on last admission and was positive for staph epidermidis.   Hemodynamically stable but remains tachycardic and frail Monitor in ICU for 24 hours She may be able to stepdown unit Transfer to [facility] hospitalist  service 11/01/2018  HIV/AIDS: most recent CD4 58 ID is following Scleral icterus Continue to monitor  Anemia chronic transfusion dependent Thrombocytopenia  Recent Labs    10/29/18 1548 10/30/18 0502  HGB 8.1* 7.2*    Continue to monitor Appreciate hematology's input Transfuse per protocol   Best practice:  Diet: Start heart healthy diet Pain/Anxiety/Delirium protocol (if indicated): NA VAP protocol (if indicated): NA DVT prophylaxis: SDCs GI prophylaxis: NA Glucose control: NA Mobility: BR Code Status: FULL Family Communication: Patient updated at bedside Disposition: ICU  Labs   CBC: Recent Labs  Lab 10/29/18 1548 10/30/18 0502  WBC 9.4 13.0*  NEUTROABS 8.6*  --   HGB 8.1* 7.2*  HCT 24.9* 22.2*  MCV 96.5 96.5  PLT 52* 41*    Basic Metabolic Panel: Recent Labs  Lab 10/29/18 1505 10/30/18 0502 10/31/18 0425  NA 133* 140 134*  K 4.8 3.6 4.3  CL 104 120* 108  CO2 16* 14* 14*  GLUCOSE 119* 174* 181*  BUN _0 CREATININE 1.07* 0.74 0.77  CALCIUM 8.5* 6.0* 7.5*  MG  --  1.0* 1.8  PHOS  --  3.6 3.1   GFR: Estimated Creatinine Clearance: 78.8 mL/min (by C-G formula based on SCr of 0.77 mg/dL). Recent Labs  Lab 10/29/18 1548 10/29/18 2105 10/30/18 0502 10/30/18 1238 10/30/18 1900  PROCALCITON  --   --  >150.00  --   --   WBC 9.4  --  13.0*  --   --   LATICACIDVEN  --  2.2* 4.7* 4.6* 4.9*    Liver Function Tests: Recent Labs  Lab 10/29/18 1505 10/30/18 0502  AST 63* 25  ALT 33 23  ALKPHOS 390* 216*  BILITOT 4.6* 2.3*  PROT 6.4* 4.7*  ALBUMIN 2.4* 1.5*   No results for input(s): LIPASE, AMYLASE in the last 168 hours. No results for input(s): AMMONIA in the last 168 hours.  ABG    Component Value Date/Time   PHART 7.480 (H) 10/14/2018 2230   PCO2ART 35.2 10/14/2018 2230   PO2ART 467 (H) 10/14/2018 2230   HCO3 25.9 10/14/2018 2230   TCO2 15 (L) 10/06/2018 0300   ACIDBASEDEF 10.9 (H) 10/08/2018 1139   O2SAT 100.0 10/14/2018  2230     Coagulation Profile: Recent Labs  Lab 10/29/18 1505 10/30/18 0502  INR 1.5* 1.9*    Cardiac Enzymes: No results for input(s): CKTOTAL, CKMB, CKMBINDEX, TROPONINI in the last 168 hours.  HbA1C: Hgb A1c MFr Bld  Date/Time Value Ref Range Status  08/09/2018 02:28 AM 5.5 4.8 - 5.6 % Final    Comment:    (NOTE) Pre diabetes:          5.7%-6.4% Diabetes:              >6.4% Glycemic control for   <7.0% adults with diabetes   08/02/2018 06:46 PM 4.7 (L) 4.8 - 5.6 % Final    Comment:    (NOTE) Pre diabetes:          5.7%-6.4% Diabetes:              >6.4% Glycemic control for   <7.0% adults with diabetes     CBG: Recent Labs  Lab 10/29/18 2358 10/30/18 1242 10/30/18 1806 10/30/18 2336 10/31/18  [place] 97 136*     Critical care time: 35 mins     Richardson Landry  ACNP Maryanna Shape PCCM Pager [phone] till 1 pm If no answer page [phone] 10/31/2018, 9:08 AM

## 2018-10-31 NOTE — Progress Notes (Signed)
[facility]   Reason for visit: Follow up on hypoxemia  Interval History: remains in ICU, now though on nasal cannula and feeling better.  Afebrile.  No infection noted.  Asking for food.    Physical Exam: Constitutional:  Vitals:   10/31/18 0830 10/31/18 0845  BP: 121/77 124/82  Pulse: (!) 124 (!) 120  Resp: (!) 36 (!) 30  Temp:    SpO2: 99% 100%   patient appears in nad Eyes: anicteric HENT: on nasal cannula Respiratory: mild increased respiratory effort; CTA B Cardiovascular: tachy RR GI: soft, nt, nd Skin: molluscum rash  Review of Systems: Constitutional: negative for fevers and chills Respiratory: positive for cough, negative for sputum or hemoptysis Gastrointestinal: negative for diarrhea Musculoskeletal: negative for myalgias and arthralgias  Lab Results  Component Value Date   WBC 13.0 (H) 10/30/2018   HGB 7.2 (L) 10/30/2018   HCT 22.2 (L) 10/30/2018   MCV 96.5 10/30/2018   PLT 41 (L) 10/30/2018    Lab Results  Component Value Date   CREATININE 0.77 10/31/2018   BUN 15 10/31/2018   NA 134 (L) 10/31/2018   K 4.3 10/31/2018   CL 108 10/31/2018   CO2 14 (L) 10/31/2018    Lab Results  Component Value Date   ALT 23 10/30/2018   AST 25 10/30/2018   ALKPHOS 216 (H) 10/30/2018     Microbiology: Recent Results (from the past 240 hour(s))  Culture, blood (x 2)     Status: None (Preliminary result)   Collection Time: 10/29/18  3:01 PM   Specimen: BLOOD LEFT HAND  Result Value Ref Range Status   Specimen Description BLOOD LEFT HAND  Final   Special Requests   Final    BOTTLES DRAWN AEROBIC AND ANAEROBIC Blood Culture adequate volume   Culture   Final    NO GROWTH < 24 HOURS Performed at [facility], [address], [place], [place] [address]    Report Status PENDING  Incomplete  SARS Coronavirus 2 [facility] order, Performed in [facility] hospital lab) Nasopharyngeal Nasopharyngeal Swab     Status: None   Collection Time: 10/29/18  3:36 PM   Specimen: Nasopharyngeal Swab  Result Value Ref Range Status   SARS Coronavirus 2 NEGATIVE NEGATIVE Final    Comment: (NOTE) If result is NEGATIVE SARS-CoV-2 target nucleic acids are NOT DETECTED. The SARS-CoV-2 RNA is generally detectable in upper and lower  respiratory specimens during the acute phase of infection. The lowest  concentration of SARS-CoV-2 viral copies this assay can detect is 250  copies / mL. A negative result does not preclude SARS-CoV-2 infection  and should not be used as the sole basis for treatment or other  patient management decisions.  A negative result may occur with  improper specimen collection / handling, submission of specimen other  than nasopharyngeal swab, presence of viral mutation(s) within the  areas targeted by this assay, and inadequate number of viral copies  (<250 copies / mL). A negative result must be combined with clinical  observations, patient history, and epidemiological information. If result is POSITIVE SARS-CoV-2 target nucleic acids are DETECTED. The SARS-CoV-2 RNA is generally detectable in upper and lower  respiratory specimens dur ing the acute phase of infection.  Positive  results are indicative of active infection with SARS-CoV-2.  Clinical  correlation with patient history and other diagnostic information is  necessary to determine patient infection status.  Positive results do  not rule out  bacterial infection or co-infection with other viruses. If result is PRESUMPTIVE POSTIVE SARS-CoV-2 nucleic acids MAY BE PRESENT.   A presumptive positive result was obtained on the submitted specimen  and confirmed on repeat testing.  While 2019 novel coronavirus  (SARS-CoV-2) nucleic acids may be present in the submitted sample  additional confirmatory testing may be necessary for epidemiological  and / or clinical management purposes  to differentiate between  SARS-CoV-2 and other Sarbecovirus  currently known to infect humans.  If clinically indicated additional testing with an alternate test  methodology [phone] is advised. The SARS-CoV-2 RNA is generally  detectable in upper and lower respiratory sp ecimens during the acute  phase of infection. The expected result is Negative. Fact Sheet for Patients:  StrictlyIdeas.no Fact Sheet for Healthcare Providers: BankingDealers.co.za This test is not yet approved or cleared by the [place] FDA and has been authorized for detection and/or diagnosis of SARS-CoV-2 by FDA under an Emergency Use Authorization (EUA).  This EUA will remain in effect (meaning this test can be used) for the duration of the COVID-19 declaration under Section 564(b)(1) of the Act, 21 U.S.C. section 360bbb-3(b)(1), unless the authorization is terminated or revoked sooner. Performed at [facility], [place] [address], [place], [place] [address]   Culture, blood (x 2)     Status: None (Preliminary result)   Collection Time: 10/29/18  3:48 PM   Specimen: BLOOD  Result Value Ref Range Status   Specimen Description BLOOD SITE NOT SPECIFIED  Final   Special Requests   Final    BOTTLES DRAWN AEROBIC ONLY Blood Culture results may not be optimal due to an inadequate volume of blood received in culture bottles   Culture   Final    NO GROWTH < 24 HOURS Performed at [facility], [place] [address], [place], [place] [address]    Report Status PENDING  Incomplete  Respiratory Panel by PCR     Status: None   Collection Time: 10/29/18  9:09 PM   Specimen: Nasopharyngeal Swab; Respiratory  Result Value Ref Range Status   Adenovirus NOT DETECTED NOT DETECTED Final   Coronavirus 229E NOT DETECTED NOT DETECTED Final    Comment: (NOTE) The Coronavirus on the Respiratory Panel, DOES NOT test for the novel  Coronavirus (2019 nCoV)    Coronavirus HKU1 NOT DETECTED NOT DETECTED Final   Coronavirus NL63 NOT  DETECTED NOT DETECTED Final   Coronavirus OC43 NOT DETECTED NOT DETECTED Final   Metapneumovirus NOT DETECTED NOT DETECTED Final   Rhinovirus / Enterovirus NOT DETECTED NOT DETECTED Final   Influenza A NOT DETECTED NOT DETECTED Final   Influenza B NOT DETECTED NOT DETECTED Final   Parainfluenza Virus 1 NOT DETECTED NOT DETECTED Final   Parainfluenza Virus 2 NOT DETECTED NOT DETECTED Final   Parainfluenza Virus 3 NOT DETECTED NOT DETECTED Final   Parainfluenza Virus 4 NOT DETECTED NOT DETECTED Final   Respiratory Syncytial Virus NOT DETECTED NOT DETECTED Final   Bordetella pertussis NOT DETECTED NOT DETECTED Final   Chlamydophila pneumoniae NOT DETECTED NOT DETECTED Final   Mycoplasma pneumoniae NOT DETECTED NOT DETECTED Final    Comment: Performed at [facility], [place]. [address] [place],  [address]    Impression/Plan:  1. HIV - this has been well-controlled with a persistently suppressed viral load.  Her low CD4 count is reflective of ongoing illness, steroid use, low starting point.  She remains on Biktarvy and will continue.    2.  [place] - high ferritin, fever, hypotension.  Has been an ongoing issue with multiple hospitalizations.  Some may have been infection.  No obvious infection at this time.   I will stop the antibiotics She started solumedrol by me after discussion with Dr. Nelda Marseille, I will put her back on her dexamethasone dose per Dr. Irene Limbo at 10 mg daily.   I agree she will benefit from transfer to an academic center for further evaluation, treatment considerations.  I appreciate CCM help in transferring when possible.    3.  CMV - has remained high but unclear etiology.  No indication at this time for treatment but I do agree she will need prophylaxis/treatment if/when she gets further treatment with ganciclovir.    4. OI prophylaxis - I will put her back on Bactrim prophylaxis. CD4 pending this hospitalization but highest has been 58 despite well-controlled HIV.

## 2018-11-01 ENCOUNTER — Inpatient Hospital Stay (HOSPITAL_COMMUNITY): Payer: 59

## 2018-11-01 ENCOUNTER — Inpatient Hospital Stay: Payer: 59 | Admitting: Hematology

## 2018-11-01 ENCOUNTER — Inpatient Hospital Stay: Payer: 59

## 2018-11-01 ENCOUNTER — Ambulatory Visit: Payer: 59

## 2018-11-01 LAB — HIV-1 RNA QUANT-NO REFLEX-BLD
HIV 1 RNA Quant: <20 copies/mL
LOG10 HIV-1 RNA: UNDETERMINED log10copy/mL

## 2018-11-01 LAB — BASIC METABOLIC PANEL
Anion gap: 10 (ref 5–15)
BUN: 14 mg/dL (ref 6–20)
CO2: 13 mmol/L — ABNORMAL LOW (ref 22–32)
Calcium: 7.5 mg/dL — ABNORMAL LOW (ref 8.9–10.3)
Chloride: 112 mmol/L — ABNORMAL HIGH (ref 98–111)
Creatinine, Ser: 0.77 mg/dL (ref 0.44–1.00)
GFR calc Af Amer: >60 mL/min (ref >60)
GFR calc non Af Amer: >60 mL/min (ref >60)
Glucose, Bld: 117 mg/dL — ABNORMAL HIGH (ref 70–99)
Potassium: 5.1 mmol/L (ref 3.5–5.1)
Sodium: 135 mmol/L (ref 135–145)

## 2018-11-01 LAB — CBC WITH DIFFERENTIAL/PLATELET
Abs Immature Granulocytes: 0 10*3/uL (ref 0.00–0.07)
Band Neutrophils: 9 %
Basophils Absolute: 0 10*3/uL (ref 0.0–0.1)
Basophils Relative: 0 %
Eosinophils Absolute: 0 10*3/uL (ref 0.0–0.5)
Eosinophils Relative: 0 %
HCT: 20.4 % — ABNORMAL LOW (ref 36.0–46.0)
Hemoglobin: 6.8 g/dL — CL (ref 12.0–15.0)
Lymphocytes Relative: 7 %
Lymphs Abs: 1 10*3/uL (ref 0.7–4.0)
MCH: 30.2 pg (ref 26.0–34.0)
MCHC: 33.3 g/dL (ref 30.0–36.0)
MCV: 90.7 fL (ref 80.0–100.0)
Monocytes Absolute: 0.6 10*3/uL (ref 0.1–1.0)
Monocytes Relative: 4 %
Neutro Abs: 12.8 10*3/uL — ABNORMAL HIGH (ref 1.7–7.7)
Neutrophils Relative %: 80 %
Platelets: 23 10*3/uL — CL (ref 150–400)
RBC: 2.25 MIL/uL — ABNORMAL LOW (ref 3.87–5.11)
RDW: 21.9 % — ABNORMAL HIGH (ref 11.5–15.5)
WBC: 14.4 10*3/uL — ABNORMAL HIGH (ref 4.0–10.5)
nRBC: 0.1 % (ref 0.0–0.2)
nRBC: 2 /100 WBC — ABNORMAL HIGH

## 2018-11-01 LAB — PHOSPHORUS: Phosphorus: 4.2 mg/dL (ref 2.5–4.6)

## 2018-11-01 LAB — GLUCOSE, CAPILLARY
Glucose-Capillary: 78 mg/dL (ref 70–99)
Glucose-Capillary: 90 mg/dL (ref 70–99)

## 2018-11-01 LAB — MAGNESIUM: Magnesium: 1.6 mg/dL — ABNORMAL LOW (ref 1.7–2.4)

## 2018-11-01 LAB — PROCALCITONIN: Procalcitonin: 47.95 ng/mL

## 2018-11-01 LAB — LACTIC ACID, PLASMA: Lactic Acid, Venous: 4.4 mmol/L (ref 0.5–1.9)

## 2018-11-01 LAB — PREPARE RBC (CROSSMATCH)

## 2018-11-01 MED ORDER — SODIUM CHLORIDE 0.9% IV SOLUTION
Freq: Once | INTRAVENOUS | Status: AC
  Administered 2018-11-01: 21:00:00 via INTRAVENOUS

## 2018-11-01 MED ORDER — MAGNESIUM SULFATE 2 GM/50ML IV SOLN
2.0000 g | Freq: Once | INTRAVENOUS | Status: AC
  Administered 2018-11-01: 2 g via INTRAVENOUS
  Filled 2018-11-01: qty 50

## 2018-11-01 MED ORDER — SODIUM CHLORIDE 0.9% IV SOLUTION: Freq: Once | INTRAVENOUS | Status: DC

## 2018-11-01 NOTE — Progress Notes (Signed)
PROGRESS NOTE    Krystal Short  QIH:[id number] DOB: Oct 21, 1985 DOA: 10/29/2018 PCP: Lajean Manes, MD   Brief Narrative:  Patient is a 33 year old female with history of HIV, HLH(Hemophagocytic lymphohistiocytosis) , chronic thrombocytopenia, pneumonia secondary to PCP, oral thrush who was initially admitted here on 8/29 due to respiratory failure secondary to bilateral infiltrates.  She required mechanical ventilation.  BAL was positive for staph epidermidis and there was also concern for [place].  She was treated with empiric antibiotics and steroids.  She was extubated on 9/7 and was discharged home.  Hospital course was complicated by acute kidney injury and thrombocytopenia.  She again presented to the emergency department on 9/22.  This time she presented with generalized weakness.  On upon, arrival she was found to be hypotensive, there was concern for septic shock  She was admitted under PCCM service.  Started on broad-spectrum antibiotics.  ID consulted.  Oncology was also consulted for [facility].  Currently the antibiotics have been discontinued, because there is no obvious evidence of acute  infection.  She has been recommended to be transferred to tertiary care center.  Current plan is to transfer to [facility].  Waiting for bed.  Assessment & Plan:   Active Problems:   HIV infection ([place])   HCAP (healthcare-associated pneumonia)   Septic shock ([place])   Suspected septic shock: Presented with hypotension.  No clear source of infection.  Currently blood pressure stable.  Remains tachycardic. ID was following and antibiotics have been stopped.  Hemophagocytic lymphohistiocytosis: Oncology was following here.  She has severe anemia, thrombocytopenia due to this.  She has several hospitalizations this year.  She has transfusion dependent anemia, thrombocytopenia.  She has elevated white cell counts.   Currently on dexamethasone 10 mg daily. Elevated IL-6, ferritin, LDH. Will transfuse with  hemoglobin for less than 7.5.  Hemoglobin dropped to 6.8 today.  Will transfuse with 1 drop PRBC.  Platelets at 23.  Will transfuse for less than 20.  HIV/AIDS: On Biktarvy.  Has history of recent PCP pneumonia.  Continue prophylaxis.  She has splenomegaly and some lymphadenopathy on previous imagings.  She has high CMV titers.  No indication at this time for treatment as per ID.  Sinus tachycardia: We will continue to monitor.  Blood pressure stable.  Hypomagnesemia: Supplemented with magnesium.         DVT prophylaxis:SCD Code Status: Full Family Communication: None present at the bedside Disposition Plan: Transfer to [facility]   Consultants: PCCM, oncology, ID  Procedures: None  Antimicrobials:  Anti-infectives (From admission, onward)   Start     Dose/Rate Route Frequency Ordered Stop   11/01/18 1000  sulfamethoxazole-trimethoprim (BACTRIM) 400-80 MG per tablet 1 tablet     1 tablet Oral Daily 10/31/18 0941     10/31/18 1300  bictegravir-emtricitabine-tenofovir AF (BIKTARVY) 50-200-25 MG per tablet 1 tablet     1 tablet Oral Daily 10/31/18 1225     10/30/18 1630  vancomycin (VANCOCIN) 1,250 mg in sodium chloride 0.9 % 250 mL IVPB  Status:  Discontinued     1,250 mg 166.7 mL/hr over 90 Minutes Intravenous Every 24 hours 10/29/18 1535 10/30/18 1152   10/30/18 0000  ceFEPIme (MAXIPIME) 2 g in sodium chloride 0.9 % 100 mL IVPB  Status:  Discontinued     2 g 200 mL/hr over 30 Minutes Intravenous Every 8 hours 10/29/18 1535 10/31/18 0941   10/29/18 1845  sulfamethoxazole-trimethoprim (BACTRIM) 338.72 mg in dextrose 5 % 500 mL IVPB  Status:  Discontinued     20 mg/kg/day  50.8 kg 347.4 mL/hr over 90 Minutes Intravenous Every 8 hours 10/29/18 1842 10/31/18 0941   10/29/18 1815  vancomycin (VANCOCIN) IVPB 1000 mg/200 mL premix  Status:  Discontinued     1,000 mg 200 mL/hr over 60 Minutes Intravenous  Once 10/29/18 1810 10/29/18 1812   10/29/18 1815  ceFEPIme (MAXIPIME) 2 g in  sodium chloride 0.9 % 100 mL IVPB  Status:  Discontinued     2 g 200 mL/hr over 30 Minutes Intravenous  Once 10/29/18 1810 10/29/18 1813   10/29/18 1815  azithromycin (ZITHROMAX) 500 mg in sodium chloride 0.9 % 250 mL IVPB  Status:  Discontinued     500 mg 250 mL/hr over 60 Minutes Intravenous Every 24 hours 10/29/18 1810 10/31/18 0941   10/29/18 1515  vancomycin (VANCOCIN) IVPB 1000 mg/200 mL premix  Status:  Discontinued     1,000 mg 200 mL/hr over 60 Minutes Intravenous  Once 10/29/18 1506 10/29/18 1508   10/29/18 1515  ceFEPIme (MAXIPIME) 2 g in sodium chloride 0.9 % 100 mL IVPB     2 g 200 mL/hr over 30 Minutes Intravenous  Once 10/29/18 1506 10/29/18 1641   10/29/18 1515  vancomycin (VANCOCIN) 1,250 mg in sodium chloride 0.9 % 250 mL IVPB     1,250 mg 166.7 mL/hr over 90 Minutes Intravenous  Once 10/29/18 1508 10/29/18 1826      Subjective:  Patient seen and examined the bedside this morning.  Blood pressure stable.  In sinus tachycardia.  Complains of some sore throat.  Denies any shortness of breath or chest pain.  Objective: Vitals:   11/01/18 1145 11/01/18 1200 11/01/18 1215 11/01/18 1220  BP: 123/86 114/86 115/86   Pulse: (!) 119 (!) 125 (!) 123 (!) 125  Resp: (!) 36 (!) 36 (!) 39 (!) 30  Temp:  98.2 F (36.8 C)  98.3 F (36.8 C)  TempSrc:  Oral  Oral  SpO2: 100% 100% 100% 100%  Weight:      Height:        Intake/Output Summary (Last 24 hours) at 11/01/2018 1343 Last data filed at 11/01/2018 1210 Gross per 24 hour  Intake 2442.11 ml  Output 350 ml  Net 2092.11 ml   Filed Weights   10/30/18 0500 10/31/18 0500 11/01/18 0500  Weight: 49.9 kg 50.9 kg 52.3 kg    Examination:  General exam: Generalized weakness, deconditioned/debilitated HEENT:PERRL,Oral mucosa moist, Ear/Nose normal on gross exam, molluscum contagiosum lesions on the face Respiratory system: Bilateral equal air entry, normal vesicular breath sounds, no wheezes or crackles  Cardiovascular  system: Sinus tachycardia. No JVD, murmurs, rubs, gallops or clicks.  Trace pedal edema. Gastrointestinal system: Abdomen is nondistended, soft and nontender. No organomegaly or masses felt. Normal bowel sounds heard. Central nervous system: Alert and oriented. No focal neurological deficits. Extremities: Trace edema, no clubbing ,no cyanosis, distal peripheral pulses palpable. Skin: No rashes, lesions or ulcers,no icterus ,no pallor     Data Reviewed: I have personally reviewed following labs and imaging studies  CBC: Recent Labs  Lab 10/29/18 1548 10/30/18 0502 11/01/18 0832  WBC 9.4 12.9*   13.0* 14.4*  NEUTROABS 8.6* 10.8* 12.8*  HGB 8.1* 7.2*   7.4* 6.8*  HCT 24.9* 22.2*   20.9* 20.4*  MCV 96.5 96.5   87 90.7  PLT 52* 41*   41* 23*   Basic Metabolic Panel: Recent Labs  Lab 10/29/18 1505 10/30/18 0502 10/31/18 0425 11/01/18 0832  NA  133* 140 134* 135  K 4.8 3.6 4.3 5.1  CL 104 120* 108 112*  CO2 16* 14* 14* 13*  GLUCOSE 119* 174* 181* 117*  BUN _0 CREATININE 1.07* 0.74 0.77 0.77  CALCIUM 8.5* 6.0* 7.5* 7.5*  MG  --  1.0* 1.8 1.6*  PHOS  --  3.6 3.1 4.2   GFR: Estimated Creatinine Clearance: 78.8 mL/min (by C-G formula based on SCr of 0.77 mg/dL). Liver Function Tests: Recent Labs  Lab 10/29/18 1505 10/30/18 0502  AST 63* 25  ALT 33 23  ALKPHOS 390* 216*  BILITOT 4.6* 2.3*  PROT 6.4* 4.7*  ALBUMIN 2.4* 1.5*   No results for input(s): LIPASE, AMYLASE in the last 168 hours. No results for input(s): AMMONIA in the last 168 hours. Coagulation Profile: Recent Labs  Lab 10/29/18 1505 10/30/18 0502  INR 1.5* 1.9*   Cardiac Enzymes: No results for input(s): CKTOTAL, CKMB, CKMBINDEX, TROPONINI in the last 168 hours. BNP (last 3 results) No results for input(s): PROBNP in the last 8760 hours. HbA1C: No results for input(s): HGBA1C in the last 72 hours. CBG: Recent Labs  Lab 10/31/18 1200 10/31/18 1758 10/31/18 2348 11/01/18 0531  11/01/18 1153  GLUCAP 77 83 82 78 90   Lipid Profile: Recent Labs    10/30/18 1537  TRIG 116   Thyroid Function Tests: No results for input(s): TSH, T4TOTAL, FREET4, T3FREE, THYROIDAB in the last 72 hours. Anemia Panel: Recent Labs    10/30/18 1537  FERRITIN >7,500*   Sepsis Labs: Recent Labs  Lab 10/30/18 0502 10/30/18 1238 10/30/18 1900 10/31/18 0855 11/01/18 0832  PROCALCITON >150.00  --   --   --  47.95  LATICACIDVEN 4.7* 4.6* 4.9* 3.9*  --     Recent Results (from the past 240 hour(s))  Culture, blood (x 2)     Status: None (Preliminary result)   Collection Time: 10/29/18  3:01 PM   Specimen: BLOOD LEFT HAND  Result Value Ref Range Status   Specimen Description BLOOD LEFT HAND  Final   Special Requests   Final    BOTTLES DRAWN AEROBIC AND ANAEROBIC Blood Culture adequate volume   Culture   Final    NO GROWTH 2 DAYS Performed at [facility], [place] [address] [place], [place] [address]    Report Status PENDING  Incomplete  SARS Coronavirus 2 [facility] order, Performed in [facility] hospital lab) Nasopharyngeal Nasopharyngeal Swab     Status: None   Collection Time: 10/29/18  3:36 PM   Specimen: Nasopharyngeal Swab  Result Value Ref Range Status   SARS Coronavirus 2 NEGATIVE NEGATIVE Final    Comment: (NOTE) If result is NEGATIVE SARS-CoV-2 target nucleic acids are NOT DETECTED. The SARS-CoV-2 RNA is generally detectable in upper and lower  respiratory specimens during the acute phase of infection. The lowest  concentration of SARS-CoV-2 viral copies this assay can detect is 250  copies / mL. A negative result does not preclude SARS-CoV-2 infection  and should not be used as the sole basis for treatment or other  patient management decisions.  A negative result may occur with  improper specimen collection / handling, submission of specimen other  than nasopharyngeal swab, presence of viral mutation(s) within the  areas targeted by this assay, and  inadequate number of viral copies  (<250 copies / mL). A negative result must be combined with clinical  observations, patient history, and epidemiological information. If result is POSITIVE SARS-CoV-2 target nucleic  acids are DETECTED. The SARS-CoV-2 RNA is generally detectable in upper and lower  respiratory specimens dur ing the acute phase of infection.  Positive  results are indicative of active infection with SARS-CoV-2.  Clinical  correlation with patient history and other diagnostic information is  necessary to determine patient infection status.  Positive results do  not rule out bacterial infection or co-infection with other viruses. If result is PRESUMPTIVE POSTIVE SARS-CoV-2 nucleic acids MAY BE PRESENT.   A presumptive positive result was obtained on the submitted specimen  and confirmed on repeat testing.  While 2019 novel coronavirus  (SARS-CoV-2) nucleic acids may be present in the submitted sample  additional confirmatory testing may be necessary for epidemiological  and / or clinical management purposes  to differentiate between  SARS-CoV-2 and other Sarbecovirus currently known to infect humans.  If clinically indicated additional testing with an alternate test  methodology [phone]) is advised. The SARS-CoV-2 RNA is generally  detectable in upper and lower respiratory sp ecimens during the acute  phase of infection. The expected result is Negative. Fact Sheet for Patients:  StrictlyIdeas.no Fact Sheet for Healthcare Providers: BankingDealers.co.za This test is not yet approved or cleared by the [place] FDA and has been authorized for detection and/or diagnosis of SARS-CoV-2 by FDA under an Emergency Use Authorization (EUA).  This EUA will remain in effect (meaning this test can be used) for the duration of the COVID-19 declaration under Section 564(b)(1) of the Act, 21 U.S.C. section 360bbb-3(b)(1), unless the  authorization is terminated or revoked sooner. Performed at [facility], [place] [address], [place], [place] [address]   Culture, blood (x 2)     Status: None (Preliminary result)   Collection Time: 10/29/18  3:48 PM   Specimen: BLOOD  Result Value Ref Range Status   Specimen Description BLOOD SITE NOT SPECIFIED  Final   Special Requests   Final    BOTTLES DRAWN AEROBIC ONLY Blood Culture results may not be optimal due to an inadequate volume of blood received in culture bottles   Culture   Final    NO GROWTH 2 DAYS Performed at [facility], [place] [address], [place], [place] [address]    Report Status PENDING  Incomplete  Respiratory Panel by PCR     Status: None   Collection Time: 10/29/18  9:09 PM   Specimen: Nasopharyngeal Swab; Respiratory  Result Value Ref Range Status   Adenovirus NOT DETECTED NOT DETECTED Final   Coronavirus 229E NOT DETECTED NOT DETECTED Final    Comment: (NOTE) The Coronavirus on the Respiratory Panel, DOES NOT test for the novel  Coronavirus (2019 nCoV)    Coronavirus HKU1 NOT DETECTED NOT DETECTED Final   Coronavirus NL63 NOT DETECTED NOT DETECTED Final   Coronavirus OC43 NOT DETECTED NOT DETECTED Final   Metapneumovirus NOT DETECTED NOT DETECTED Final   Rhinovirus / Enterovirus NOT DETECTED NOT DETECTED Final   Influenza A NOT DETECTED NOT DETECTED Final   Influenza B NOT DETECTED NOT DETECTED Final   Parainfluenza Virus 1 NOT DETECTED NOT DETECTED Final   Parainfluenza Virus 2 NOT DETECTED NOT DETECTED Final   Parainfluenza Virus 3 NOT DETECTED NOT DETECTED Final   Parainfluenza Virus 4 NOT DETECTED NOT DETECTED Final   Respiratory Syncytial Virus NOT DETECTED NOT DETECTED Final   Bordetella pertussis NOT DETECTED NOT DETECTED Final   Chlamydophila pneumoniae NOT DETECTED NOT DETECTED Final   Mycoplasma pneumoniae NOT DETECTED NOT DETECTED Final  Comment: Performed at [facility], [place] [address] [place], [place] [address]           Radiology Studies: Dg Chest Port 1 View  Result Date: 11/01/2018 CLINICAL DATA:  Respiratory failure EXAM: PORTABLE CHEST 1 VIEW COMPARISON:  10/31/2018 FINDINGS: Again identified are widespread interstitial airspace opacities similar to prior study. No pneumothorax. There are trace bilateral pleural effusions. The heart size is stable. There is no acute osseous abnormality IMPRESSION: Stable scattered interstitial airspace opacities. Persistent trace bilateral pleural effusions. Electronically Signed   By: Constance Holster M.D.   On: 11/01/2018 07:55   Dg Chest Port 1 View  Result Date: 10/31/2018 CLINICAL DATA:  Respiratory failure. EXAM: PORTABLE CHEST 1 VIEW COMPARISON:  10/30/2018 FINDINGS: The cardiomediastinal silhouette is unchanged. Lung volumes are unchanged with similar appearance of widespread interstitial opacity throughout both lungs. No sizable pleural effusion or pneumothorax is identified. Nodular density in the left lung base likely represents a nipple shadow. IMPRESSION: Unchanged widespread interstitial lung opacities. Electronically Signed   By: Logan Bores M.D.   On: 10/31/2018 09:00        Scheduled Meds:  sodium chloride   Intravenous Once   bictegravir-emtricitabine-tenofovir AF  1 tablet Oral Daily   Chlorhexidine Gluconate Cloth  6 each Topical Daily   dexamethasone  10 mg Oral Daily   insulin aspart  0-15 Units Subcutaneous Q6H   magic mouthwash w/lidocaine  5 mL Oral QID   sulfamethoxazole-trimethoprim  1 tablet Oral Daily   Continuous Infusions:  magnesium sulfate bolus IVPB       LOS: 3 days    Time spent: 35 mins.More than 50% of that time was spent in counseling and/or coordination of care.      Shelly Coss, MD [facility] Pager [phone]  If 7PM-7AM, please contact night-coverage [email or web address] Password [facility] 11/01/2018, 1:43 PM

## 2018-11-01 NOTE — Progress Notes (Signed)
Physical Therapy Evaluation Patient Details Name: Krystal Short MRN: [id number] DOB: [birth year] Today's Date: 11/01/2018   History of Present Illness  Pt adm with septic shock and PNA. PMH - HIV, Hemophagocytic lymphohistiocytosis, chronic thrombocytopenia; recent adm for acute resp failure with intubation and acute kidney injury.   Clinical Impression  Pt admitted with above diagnosis and presents to PT with functional limitations due to deficits listed below (See PT problem list). Pt needs skilled PT to maximize independence and safety to allow discharge to home with family and HHPT once medically ready. May transfer to another hospital.      Follow Up Recommendations Home health PT;Supervision for mobility/OOB    Equipment Recommendations  None recommended by PT    Recommendations for Other Services       Precautions / Restrictions Precautions Precautions: Fall Restrictions Weight Bearing Restrictions: No      Mobility  Bed Mobility Overal bed mobility: Needs Assistance Bed Mobility: Supine to Sit     Supine to sit: Supervision;HOB elevated     General bed mobility comments: Incr time to perform. Assist for lines  Transfers Overall transfer level: Needs assistance Equipment used: Rolling walker (2 wheeled) Transfers: Sit to/from [facility] Sit to Stand: Min assist Stand pivot transfers: Min assist       General transfer comment: assist for balance and support. Stand pivot bed to bsc with walker  Ambulation/Gait Ambulation/Gait assistance: Min guard Gait Distance (Feet): 80 Feet Assistive device: Rolling walker (2 wheeled) Gait Pattern/deviations: Step-through pattern;Decreased stride length Gait velocity: decr Gait velocity interpretation: <1.31 ft/sec, indicative of household ambulator General Gait Details: Assist for safety and lines. Pt amb on 2L O2 with SpO2 > 94% and HR 140's  Stairs            Wheelchair Mobility     Modified Rankin (Stroke Patients Only)       Balance Overall balance assessment: Needs assistance Sitting-balance support: No upper extremity supported;Feet supported Sitting balance-Leahy Scale: Fair     Standing balance support: Bilateral upper extremity supported Standing balance-Leahy Scale: Poor Standing balance comment: walker and min guard for static standing                             Pertinent Vitals/Pain Pain Assessment: No/denies pain    Home Living Family/patient expects to be discharged to:: Private residence Living Arrangements: Spouse/significant other;Parent Available Help at Discharge: Family;Available 24 hours/day Type of Home: Mobile home Home Access: Stairs to enter Entrance Stairs-Rails: Left Entrance Stairs-Number of Steps: 5 Home Layout: One level Home Equipment: Walker - 2 wheels;Bedside commode      Prior Function Level of Independence: Independent with assistive device(s)         Comments: Uses rolling walker at times     Hand Dominance   Dominant Hand: Right    Extremity/Trunk Assessment   Upper Extremity Assessment Upper Extremity Assessment: Generalized weakness    Lower Extremity Assessment Lower Extremity Assessment: Generalized weakness       Communication   Communication: No difficulties  Cognition Arousal/Alertness: Awake/alert Behavior During Therapy: WFL for tasks assessed/performed Overall Cognitive Status: Within Functional Limits for tasks assessed                                        General Comments      Exercises  Assessment/Plan    PT Assessment Patient needs continued PT services  PT Problem List Decreased strength;Decreased activity tolerance;Decreased balance;Decreased mobility       PT Treatment Interventions DME instruction;Gait training;Functional mobility training;Therapeutic activities;Stair training;Therapeutic exercise;Balance training;Patient/family  education    PT Goals (Current goals can be found in the Care Plan section)  Acute Rehab PT Goals PT Goal Formulation: With patient Time For Goal Achievement: 11/15/18 Potential to Achieve Goals: Good    Frequency Min 3X/week   Barriers to discharge        Co-evaluation               AM-PAC PT "6 Clicks" Mobility  Outcome Measure Help needed turning from your back to your side while in a flat bed without using bedrails?: None Help needed moving from lying on your back to sitting on the side of a flat bed without using bedrails?: A Little Help needed moving to and from a bed to a chair (including a wheelchair)?: A Little Help needed standing up from a chair using your arms (e.g., wheelchair or bedside chair)?: A Little Help needed to walk in hospital room?: A Little Help needed climbing 3-5 steps with a railing? : A Lot 6 Click Score: 18    End of Session Equipment Utilized During Treatment: Oxygen Activity Tolerance: Patient tolerated treatment well Patient left: in chair;with call bell/phone within reach;with family/visitor present Nurse Communication: Mobility status PT Visit Diagnosis: Unsteadiness on feet (R26.81);Other abnormalities of gait and mobility (R26.89);Muscle weakness (generalized) (M62.81)    Time: 1422-1525(Pt on bedside commode for extended time.) PT Time Calculation (min) (ACUTE ONLY): 63 min   Charges:   PT Evaluation $PT Eval Moderate Complexity: 1 Mod PT Treatments $Gait Training: 8-22 mins        [place] Pager [phone] Office [place] 11/01/2018, 3:37 PM

## 2018-11-01 NOTE — Discharge Summary (Signed)
Physician Discharge Summary  Patient ID: Krystal Short MRN: [id number] DOB/AGE: 1985-05-24 33 y.o.  Admit date: 10/29/2018 Discharge date: 11/01/2018  Problem List Active Problems:   HIV infection ([place])   HCAP (healthcare-associated pneumonia)   Septic shock (HCC)  HPI: 33 year old female with past medical history as below, which is significant for HIV/AIDS, chronic thrombocytopenia, pneumonia secondary to PGP, and oral thrush.  She was recently admitted to [facility] from 8/29 through for respiratory failure secondary to bilateral infiltrates required mechanical ventilation.  BAL was positive for staph epidermidis and there was also concern for [place].  She was treated with empiric antibiotics and steroids.  She is able to be extubated on 9/7.  Course was also complicated by acute kidney injury and thrombocytopenia.  She was discharged home on 9/16.  She then again presented to [facility] emergency department on 9/22.  She had reportedly been doing very well since discharge until 9/22 when she awoke with generalized illness.  She had reportedly been compliant with her discharge medications.  Reason for presentation was generalized weakness.  She denied dyspnea, nausea/vomiting, chest pain, cough, abdominal pain.  Upon arrival to the emergency department she was noted to be hypotensive.  There was of course concern for sepsis she was noted on broad-spectrum antibiotics and treated with IV fluids.  Despite 2 L of IV fluid she remained hypotensive and PCCM was asked to admit.  Past Medical History   has a past medical history of Acute hyponatremia (06/03/2017), Anemia, CAP (community acquired pneumonia) (06/03/2017), Facial dermatitis (06/03/2017), HIV (human immunodeficiency virus infection) ([place]), Pleurisy (06/03/2017), 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 ([place]), Thrush of mouth and esophagus ([place]), and UTI (urinary tract infection).   Significant Hospital Events    8/30-9/16 admit for respiratory failure 9/22  Admit for shock 11/01/2018 transferred to hospitalist service.  She awaits transfer to [facility] Consults:  ID: Dr. Novella Olive Hematological oncology; Dr. Mariea Clonts Course:  Assessment & Plan:   Shock: presumably septic +/- hypovolemic, however, no clear source. She is compromised secondary to HIV/AIDS with recent CD4 count 41. Concern for PJP. BAL was negative on last admission and was positive for staph epidermidis.   Hemodynamically stable but remains tachycardic and frail She can transfer out of the intensive care unit She may be able to stepdown unit Transfer to [facility] hospitalist service 11/01/2018  HIV/AIDS: most recent CD4 58 on 09/09/2018 repeat CD4 is 26 on 10/30/2018 ID is following Continue her Bactrim and HIV medication  Scleral icterus Continue to monitor  Anemia chronic transfusion dependent Thrombocytopenia secondary to HL H  Recent Labs (last 2 labs)       Recent Labs    10/29/18 1548 10/30/18 0502  HGB 8.1* 7.2*      Continue to monitor Appreciate hematology's input Transfuse per protocol Continue steroids Transfer to [facility] she is been accepted at [facility] at discharge Lab Results  Component Value Date   CREATININE 0.77 11/01/2018   BUN 14 11/01/2018   NA 135 11/01/2018   K 5.1 11/01/2018   CL 112 (H) 11/01/2018   CO2 13 (L) 11/01/2018   Lab Results  Component Value Date   WBC 14.4 (H) 11/01/2018   HGB 6.8 (LL) 11/01/2018   HCT 20.4 (L) 11/01/2018   MCV 90.7 11/01/2018   PLT 23 (LL) 11/01/2018   Lab Results  Component Value Date   ALT 23 10/30/2018   AST  25 10/30/2018   ALKPHOS 216 (H) 10/30/2018   BILITOT 2.3 (H) 10/30/2018   Lab Results  Component Value Date   INR 1.9 (H) 10/30/2018   INR 1.5 (H) 10/29/2018   INR 1.3 (H) 10/11/2018    Current radiology studies Dg Chest Port 1 View  Result Date: 11/01/2018 CLINICAL  DATA:  Respiratory failure EXAM: PORTABLE CHEST 1 VIEW COMPARISON:  10/31/2018 FINDINGS: Again identified are widespread interstitial airspace opacities similar to prior study. No pneumothorax. There are trace bilateral pleural effusions. The heart size is stable. There is no acute osseous abnormality IMPRESSION: Stable scattered interstitial airspace opacities. Persistent trace bilateral pleural effusions. Electronically Signed   By: Constance Holster M.D.   On: 11/01/2018 07:55   Dg Chest Port 1 View  Result Date: 10/31/2018 CLINICAL DATA:  Respiratory failure. EXAM: PORTABLE CHEST 1 VIEW COMPARISON:  10/30/2018 FINDINGS: The cardiomediastinal silhouette is unchanged. Lung volumes are unchanged with similar appearance of widespread interstitial opacity throughout both lungs. No sizable pleural effusion or pneumothorax is identified. Nodular density in the left lung base likely represents a nipple shadow. IMPRESSION: Unchanged widespread interstitial lung opacities. Electronically Signed   By: Logan Bores M.D.   On: 10/31/2018 09:00  Medications: . sodium chloride   Intravenous Once  . bictegravir-emtricitabine-tenofovir AF  1 tablet Oral Daily  . Chlorhexidine Gluconate Cloth  6 each Topical Daily  . dexamethasone  10 mg Oral Daily  . insulin aspart  0-15 Units Subcutaneous Q6H  . magic mouthwash w/lidocaine  5 mL Oral QID  . sulfamethoxazole-trimethoprim  1 tablet Oral Daily   Disposition:  She awaits transfer to [facility] for available hematological specialist.  Pulmonary critical care has signed off at this time.  Due to this dictation is 11/01/2018 at 10:46 AM       Discharged Condition: poor  Time spent on discharge greater than 40 minutes.  Vital signs at Discharge. Temp:  [97.8 F (36.6 C)-98.8 F (37.1 C)] 98.8 F (37.1 C) (09/25 1039) Pulse Rate:  [116-135] 129 (09/25 1039) Resp:  [24-49] 35 (09/25 1039) BP: (106-127)/(70-93) 111/77 (09/25 1030) SpO2:   [95 %-100 %] 95 % (09/25 1015) Weight:  [52.3 kg] 52.3 kg (09/25 0500) Office follow up Special Information or instructions.  Per [facility]  Signed: Richardson Landry Deontrey Massi ACNP Maryanna Shape PCCM Pager [phone] till 1 pm If no answer page [phone] 11/01/2018, 10:44 AM

## 2018-11-02 DIAGNOSIS — E46 Unspecified protein-calorie malnutrition: Secondary | ICD-10-CM | POA: Diagnosis not present

## 2018-11-02 DIAGNOSIS — I953 Hypotension of hemodialysis: Secondary | ICD-10-CM | POA: Diagnosis not present

## 2018-11-02 DIAGNOSIS — J158 Pneumonia due to other specified bacteria: Secondary | ICD-10-CM | POA: Diagnosis not present

## 2018-11-02 DIAGNOSIS — J939 Pneumothorax, unspecified: Secondary | ICD-10-CM | POA: Diagnosis not present

## 2018-11-02 DIAGNOSIS — B377 Candidal sepsis: Secondary | ICD-10-CM | POA: Diagnosis not present

## 2018-11-02 DIAGNOSIS — R1319 Other dysphagia: Secondary | ICD-10-CM | POA: Diagnosis not present

## 2018-11-02 DIAGNOSIS — R1011 Right upper quadrant pain: Secondary | ICD-10-CM | POA: Diagnosis not present

## 2018-11-02 DIAGNOSIS — I313 Pericardial effusion (noninflammatory): Secondary | ICD-10-CM | POA: Diagnosis not present

## 2018-11-02 DIAGNOSIS — R410 Disorientation, unspecified: Secondary | ICD-10-CM | POA: Diagnosis not present

## 2018-11-02 DIAGNOSIS — Z452 Encounter for adjustment and management of vascular access device: Secondary | ICD-10-CM | POA: Diagnosis not present

## 2018-11-02 DIAGNOSIS — N179 Acute kidney failure, unspecified: Secondary | ICD-10-CM | POA: Diagnosis not present

## 2018-11-02 DIAGNOSIS — N049 Nephrotic syndrome with unspecified morphologic changes: Secondary | ICD-10-CM | POA: Diagnosis not present

## 2018-11-02 DIAGNOSIS — B449 Aspergillosis, unspecified: Secondary | ICD-10-CM | POA: Diagnosis not present

## 2018-11-02 DIAGNOSIS — D61818 Other pancytopenia: Secondary | ICD-10-CM | POA: Diagnosis not present

## 2018-11-02 DIAGNOSIS — R109 Unspecified abdominal pain: Secondary | ICD-10-CM | POA: Diagnosis not present

## 2018-11-02 DIAGNOSIS — J9383 Other pneumothorax: Secondary | ICD-10-CM | POA: Diagnosis not present

## 2018-11-02 DIAGNOSIS — R748 Abnormal levels of other serum enzymes: Secondary | ICD-10-CM | POA: Diagnosis not present

## 2018-11-02 DIAGNOSIS — N186 End stage renal disease: Secondary | ICD-10-CM | POA: Diagnosis not present

## 2018-11-02 DIAGNOSIS — R0789 Other chest pain: Secondary | ICD-10-CM | POA: Diagnosis not present

## 2018-11-02 DIAGNOSIS — D61810 Antineoplastic chemotherapy induced pancytopenia: Secondary | ICD-10-CM | POA: Diagnosis not present

## 2018-11-02 DIAGNOSIS — R579 Shock, unspecified: Secondary | ICD-10-CM | POA: Diagnosis not present

## 2018-11-02 DIAGNOSIS — A3781 Whooping cough due to other Bordetella species with pneumonia: Secondary | ICD-10-CM | POA: Diagnosis not present

## 2018-11-02 DIAGNOSIS — D761 Hemophagocytic lymphohistiocytosis: Secondary | ICD-10-CM | POA: Diagnosis not present

## 2018-11-02 DIAGNOSIS — D709 Neutropenia, unspecified: Secondary | ICD-10-CM | POA: Diagnosis not present

## 2018-11-02 DIAGNOSIS — R590 Localized enlarged lymph nodes: Secondary | ICD-10-CM | POA: Diagnosis not present

## 2018-11-02 DIAGNOSIS — N189 Chronic kidney disease, unspecified: Secondary | ICD-10-CM | POA: Diagnosis not present

## 2018-11-02 DIAGNOSIS — D899 Disorder involving the immune mechanism, unspecified: Secondary | ICD-10-CM | POA: Diagnosis not present

## 2018-11-02 DIAGNOSIS — L89319 Pressure ulcer of right buttock, unspecified stage: Secondary | ICD-10-CM | POA: Diagnosis not present

## 2018-11-02 DIAGNOSIS — N2889 Other specified disorders of kidney and ureter: Secondary | ICD-10-CM | POA: Diagnosis not present

## 2018-11-02 DIAGNOSIS — I959 Hypotension, unspecified: Secondary | ICD-10-CM | POA: Diagnosis not present

## 2018-11-02 DIAGNOSIS — E639 Nutritional deficiency, unspecified: Secondary | ICD-10-CM | POA: Diagnosis not present

## 2018-11-02 DIAGNOSIS — R000 Tachycardia, unspecified: Secondary | ICD-10-CM | POA: Diagnosis not present

## 2018-11-02 DIAGNOSIS — D696 Thrombocytopenia, unspecified: Secondary | ICD-10-CM | POA: Diagnosis not present

## 2018-11-02 DIAGNOSIS — J9691 Respiratory failure, unspecified with hypoxia: Secondary | ICD-10-CM | POA: Diagnosis not present

## 2018-11-02 DIAGNOSIS — E871 Hypo-osmolality and hyponatremia: Secondary | ICD-10-CM | POA: Diagnosis not present

## 2018-11-02 DIAGNOSIS — R918 Other nonspecific abnormal finding of lung field: Secondary | ICD-10-CM | POA: Diagnosis not present

## 2018-11-02 DIAGNOSIS — R9431 Abnormal electrocardiogram [ECG] [EKG]: Secondary | ICD-10-CM | POA: Diagnosis not present

## 2018-11-02 DIAGNOSIS — R718 Other abnormality of red blood cells: Secondary | ICD-10-CM | POA: Diagnosis not present

## 2018-11-02 DIAGNOSIS — R0600 Dyspnea, unspecified: Secondary | ICD-10-CM | POA: Diagnosis not present

## 2018-11-02 DIAGNOSIS — R933 Abnormal findings on diagnostic imaging of other parts of digestive tract: Secondary | ICD-10-CM | POA: Diagnosis not present

## 2018-11-02 DIAGNOSIS — I10 Essential (primary) hypertension: Secondary | ICD-10-CM | POA: Diagnosis not present

## 2018-11-02 DIAGNOSIS — J159 Unspecified bacterial pneumonia: Secondary | ICD-10-CM | POA: Diagnosis not present

## 2018-11-02 DIAGNOSIS — B258 Other cytomegaloviral diseases: Secondary | ICD-10-CM | POA: Diagnosis not present

## 2018-11-02 DIAGNOSIS — A0839 Other viral enteritis: Secondary | ICD-10-CM | POA: Diagnosis not present

## 2018-11-02 DIAGNOSIS — I081 Rheumatic disorders of both mitral and tricuspid valves: Secondary | ICD-10-CM | POA: Diagnosis not present

## 2018-11-02 DIAGNOSIS — T80218A Other infection due to central venous catheter, initial encounter: Secondary | ICD-10-CM | POA: Diagnosis not present

## 2018-11-02 DIAGNOSIS — L308 Other specified dermatitis: Secondary | ICD-10-CM | POA: Diagnosis not present

## 2018-11-02 DIAGNOSIS — L89329 Pressure ulcer of left buttock, unspecified stage: Secondary | ICD-10-CM | POA: Diagnosis not present

## 2018-11-02 DIAGNOSIS — J948 Other specified pleural conditions: Secondary | ICD-10-CM | POA: Diagnosis not present

## 2018-11-02 DIAGNOSIS — D6489 Other specified anemias: Secondary | ICD-10-CM | POA: Diagnosis not present

## 2018-11-02 DIAGNOSIS — B20 Human immunodeficiency virus [HIV] disease: Secondary | ICD-10-CM | POA: Diagnosis not present

## 2018-11-02 DIAGNOSIS — J189 Pneumonia, unspecified organism: Secondary | ICD-10-CM | POA: Diagnosis not present

## 2018-11-02 DIAGNOSIS — Z9911 Dependence on respirator [ventilator] status: Secondary | ICD-10-CM | POA: Diagnosis not present

## 2018-11-02 DIAGNOSIS — R571 Hypovolemic shock: Secondary | ICD-10-CM | POA: Diagnosis not present

## 2018-11-02 DIAGNOSIS — A419 Sepsis, unspecified organism: Secondary | ICD-10-CM | POA: Diagnosis not present

## 2018-11-02 DIAGNOSIS — A4159 Other Gram-negative sepsis: Secondary | ICD-10-CM | POA: Diagnosis not present

## 2018-11-02 DIAGNOSIS — R911 Solitary pulmonary nodule: Secondary | ICD-10-CM | POA: Diagnosis not present

## 2018-11-02 DIAGNOSIS — B001 Herpesviral vesicular dermatitis: Secondary | ICD-10-CM | POA: Diagnosis not present

## 2018-11-02 DIAGNOSIS — I5031 Acute diastolic (congestive) heart failure: Secondary | ICD-10-CM | POA: Diagnosis not present

## 2018-11-02 DIAGNOSIS — H02203 Unspecified lagophthalmos right eye, unspecified eyelid: Secondary | ICD-10-CM | POA: Diagnosis not present

## 2018-11-02 DIAGNOSIS — B3789 Other sites of candidiasis: Secondary | ICD-10-CM | POA: Diagnosis not present

## 2018-11-02 DIAGNOSIS — L258 Unspecified contact dermatitis due to other agents: Secondary | ICD-10-CM | POA: Diagnosis not present

## 2018-11-02 DIAGNOSIS — K567 Ileus, unspecified: Secondary | ICD-10-CM | POA: Diagnosis not present

## 2018-11-02 DIAGNOSIS — H3562 Retinal hemorrhage, left eye: Secondary | ICD-10-CM | POA: Diagnosis not present

## 2018-11-02 DIAGNOSIS — R633 Feeding difficulties: Secondary | ICD-10-CM | POA: Diagnosis not present

## 2018-11-02 DIAGNOSIS — H02206 Unspecified lagophthalmos left eye, unspecified eyelid: Secondary | ICD-10-CM | POA: Diagnosis not present

## 2018-11-02 DIAGNOSIS — J9601 Acute respiratory failure with hypoxia: Secondary | ICD-10-CM | POA: Diagnosis not present

## 2018-11-02 DIAGNOSIS — B379 Candidiasis, unspecified: Secondary | ICD-10-CM | POA: Diagnosis not present

## 2018-11-02 DIAGNOSIS — E8809 Other disorders of plasma-protein metabolism, not elsewhere classified: Secondary | ICD-10-CM | POA: Diagnosis not present

## 2018-11-02 DIAGNOSIS — K625 Hemorrhage of anus and rectum: Secondary | ICD-10-CM | POA: Diagnosis not present

## 2018-11-02 DIAGNOSIS — R6520 Severe sepsis without septic shock: Secondary | ICD-10-CM | POA: Diagnosis not present

## 2018-11-02 DIAGNOSIS — R188 Other ascites: Secondary | ICD-10-CM | POA: Diagnosis not present

## 2018-11-02 DIAGNOSIS — J9600 Acute respiratory failure, unspecified whether with hypoxia or hypercapnia: Secondary | ICD-10-CM | POA: Diagnosis not present

## 2018-11-02 DIAGNOSIS — B440 Invasive pulmonary aspergillosis: Secondary | ICD-10-CM | POA: Diagnosis not present

## 2018-11-02 DIAGNOSIS — Y95 Nosocomial condition: Secondary | ICD-10-CM | POA: Diagnosis not present

## 2018-11-02 DIAGNOSIS — J168 Pneumonia due to other specified infectious organisms: Secondary | ICD-10-CM | POA: Diagnosis not present

## 2018-11-02 DIAGNOSIS — J90 Pleural effusion, not elsewhere classified: Secondary | ICD-10-CM | POA: Diagnosis not present

## 2018-11-02 DIAGNOSIS — N170 Acute kidney failure with tubular necrosis: Secondary | ICD-10-CM | POA: Diagnosis not present

## 2018-11-02 DIAGNOSIS — B49 Unspecified mycosis: Secondary | ICD-10-CM | POA: Diagnosis not present

## 2018-11-02 DIAGNOSIS — B259 Cytomegaloviral disease, unspecified: Secondary | ICD-10-CM | POA: Diagnosis not present

## 2018-11-02 DIAGNOSIS — R040 Epistaxis: Secondary | ICD-10-CM | POA: Diagnosis not present

## 2018-11-02 DIAGNOSIS — K7689 Other specified diseases of liver: Secondary | ICD-10-CM | POA: Diagnosis not present

## 2018-11-02 DIAGNOSIS — Z21 Asymptomatic human immunodeficiency virus [HIV] infection status: Secondary | ICD-10-CM | POA: Diagnosis not present

## 2018-11-02 DIAGNOSIS — D72829 Elevated white blood cell count, unspecified: Secondary | ICD-10-CM | POA: Diagnosis not present

## 2018-11-02 DIAGNOSIS — L89312 Pressure ulcer of right buttock, stage 2: Secondary | ICD-10-CM | POA: Diagnosis not present

## 2018-11-02 DIAGNOSIS — Z9221 Personal history of antineoplastic chemotherapy: Secondary | ICD-10-CM | POA: Diagnosis not present

## 2018-11-02 DIAGNOSIS — E875 Hyperkalemia: Secondary | ICD-10-CM | POA: Diagnosis not present

## 2018-11-02 DIAGNOSIS — K5792 Diverticulitis of intestine, part unspecified, without perforation or abscess without bleeding: Secondary | ICD-10-CM | POA: Diagnosis not present

## 2018-11-02 DIAGNOSIS — Z4682 Encounter for fitting and adjustment of non-vascular catheter: Secondary | ICD-10-CM | POA: Diagnosis not present

## 2018-11-02 DIAGNOSIS — E8770 Fluid overload, unspecified: Secondary | ICD-10-CM | POA: Diagnosis not present

## 2018-11-02 DIAGNOSIS — R140 Abdominal distension (gaseous): Secondary | ICD-10-CM | POA: Diagnosis not present

## 2018-11-02 DIAGNOSIS — H189 Unspecified disorder of cornea: Secondary | ICD-10-CM | POA: Diagnosis not present

## 2018-11-02 DIAGNOSIS — L97521 Non-pressure chronic ulcer of other part of left foot limited to breakdown of skin: Secondary | ICD-10-CM | POA: Diagnosis not present

## 2018-11-02 DIAGNOSIS — D739 Disease of spleen, unspecified: Secondary | ICD-10-CM | POA: Diagnosis not present

## 2018-11-02 DIAGNOSIS — I493 Ventricular premature depolarization: Secondary | ICD-10-CM | POA: Diagnosis not present

## 2018-11-02 DIAGNOSIS — B961 Klebsiella pneumoniae [K. pneumoniae] as the cause of diseases classified elsewhere: Secondary | ICD-10-CM | POA: Diagnosis not present

## 2018-11-02 DIAGNOSIS — Z515 Encounter for palliative care: Secondary | ICD-10-CM | POA: Diagnosis not present

## 2018-11-02 DIAGNOSIS — R509 Fever, unspecified: Secondary | ICD-10-CM | POA: Diagnosis not present

## 2018-11-02 DIAGNOSIS — J9311 Primary spontaneous pneumothorax: Secondary | ICD-10-CM | POA: Diagnosis not present

## 2018-11-02 DIAGNOSIS — K130 Diseases of lips: Secondary | ICD-10-CM | POA: Diagnosis not present

## 2018-11-02 DIAGNOSIS — N172 Acute kidney failure with medullary necrosis: Secondary | ICD-10-CM | POA: Diagnosis not present

## 2018-11-02 DIAGNOSIS — K529 Noninfective gastroenteritis and colitis, unspecified: Secondary | ICD-10-CM | POA: Diagnosis not present

## 2018-11-02 DIAGNOSIS — R6521 Severe sepsis with septic shock: Secondary | ICD-10-CM | POA: Diagnosis not present

## 2018-11-02 DIAGNOSIS — A3711 Whooping cough due to Bordetella parapertussis with pneumonia: Secondary | ICD-10-CM | POA: Diagnosis not present

## 2018-11-02 DIAGNOSIS — D62 Acute posthemorrhagic anemia: Secondary | ICD-10-CM | POA: Diagnosis not present

## 2018-11-02 DIAGNOSIS — J984 Other disorders of lung: Secondary | ICD-10-CM | POA: Diagnosis not present

## 2018-11-02 DIAGNOSIS — R5081 Fever presenting with conditions classified elsewhere: Secondary | ICD-10-CM | POA: Diagnosis not present

## 2018-11-02 DIAGNOSIS — I517 Cardiomegaly: Secondary | ICD-10-CM | POA: Diagnosis not present

## 2018-11-02 DIAGNOSIS — R042 Hemoptysis: Secondary | ICD-10-CM | POA: Diagnosis not present

## 2018-11-02 DIAGNOSIS — R141 Gas pain: Secondary | ICD-10-CM | POA: Diagnosis not present

## 2018-11-02 DIAGNOSIS — Z4659 Encounter for fitting and adjustment of other gastrointestinal appliance and device: Secondary | ICD-10-CM | POA: Diagnosis not present

## 2018-11-02 DIAGNOSIS — R093 Abnormal sputum: Secondary | ICD-10-CM | POA: Diagnosis not present

## 2018-11-02 DIAGNOSIS — L97511 Non-pressure chronic ulcer of other part of right foot limited to breakdown of skin: Secondary | ICD-10-CM | POA: Diagnosis not present

## 2018-11-02 DIAGNOSIS — J17 Pneumonia in diseases classified elsewhere: Secondary | ICD-10-CM | POA: Diagnosis not present

## 2018-11-02 DIAGNOSIS — R945 Abnormal results of liver function studies: Secondary | ICD-10-CM | POA: Diagnosis not present

## 2018-11-02 DIAGNOSIS — R7881 Bacteremia: Secondary | ICD-10-CM | POA: Diagnosis not present

## 2018-11-02 DIAGNOSIS — D733 Abscess of spleen: Secondary | ICD-10-CM | POA: Diagnosis not present

## 2018-11-02 DIAGNOSIS — E872 Acidosis: Secondary | ICD-10-CM | POA: Diagnosis not present

## 2018-11-02 DIAGNOSIS — Z79899 Other long term (current) drug therapy: Secondary | ICD-10-CM | POA: Diagnosis not present

## 2018-11-02 DIAGNOSIS — D649 Anemia, unspecified: Secondary | ICD-10-CM | POA: Diagnosis not present

## 2018-11-02 DIAGNOSIS — D638 Anemia in other chronic diseases classified elsewhere: Secondary | ICD-10-CM | POA: Diagnosis not present

## 2018-11-02 DIAGNOSIS — R34 Anuria and oliguria: Secondary | ICD-10-CM | POA: Diagnosis not present

## 2018-11-02 DIAGNOSIS — B251 Cytomegaloviral hepatitis: Secondary | ICD-10-CM | POA: Diagnosis not present

## 2018-11-02 DIAGNOSIS — R791 Abnormal coagulation profile: Secondary | ICD-10-CM | POA: Diagnosis not present

## 2018-11-02 DIAGNOSIS — J150 Pneumonia due to Klebsiella pneumoniae: Secondary | ICD-10-CM | POA: Diagnosis not present

## 2018-11-02 DIAGNOSIS — R578 Other shock: Secondary | ICD-10-CM | POA: Diagnosis not present

## 2018-11-02 DIAGNOSIS — R1312 Dysphagia, oropharyngeal phase: Secondary | ICD-10-CM | POA: Diagnosis not present

## 2018-11-02 DIAGNOSIS — B371 Pulmonary candidiasis: Secondary | ICD-10-CM | POA: Diagnosis not present

## 2018-11-02 DIAGNOSIS — R160 Hepatomegaly, not elsewhere classified: Secondary | ICD-10-CM | POA: Diagnosis not present

## 2018-11-02 DIAGNOSIS — R809 Proteinuria, unspecified: Secondary | ICD-10-CM | POA: Diagnosis not present

## 2018-11-02 DIAGNOSIS — L97519 Non-pressure chronic ulcer of other part of right foot with unspecified severity: Secondary | ICD-10-CM | POA: Diagnosis not present

## 2018-11-02 LAB — CBC WITH DIFFERENTIAL/PLATELET
Abs Immature Granulocytes: 1.76 10*3/uL — ABNORMAL HIGH (ref 0.00–0.07)
Basophils Absolute: 0.1 10*3/uL (ref 0.0–0.1)
Basophils Relative: 0 %
Eosinophils Absolute: 0 10*3/uL (ref 0.0–0.5)
Eosinophils Relative: 0 %
HCT: 31.2 % — ABNORMAL LOW (ref 36.0–46.0)
Hemoglobin: 10.4 g/dL — ABNORMAL LOW (ref 12.0–15.0)
Immature Granulocytes: 11 %
Lymphocytes Relative: 8 %
Lymphs Abs: 1.2 10*3/uL (ref 0.7–4.0)
MCH: 30.3 pg (ref 26.0–34.0)
MCHC: 33.3 g/dL (ref 30.0–36.0)
MCV: 91 fL (ref 80.0–100.0)
Monocytes Absolute: 2 10*3/uL — ABNORMAL HIGH (ref 0.1–1.0)
Monocytes Relative: 13 %
Neutro Abs: 10.6 10*3/uL — ABNORMAL HIGH (ref 1.7–7.7)
Neutrophils Relative %: 68 %
Platelets: 18 10*3/uL — CL (ref 150–400)
RBC: 3.43 MIL/uL — ABNORMAL LOW (ref 3.87–5.11)
RDW: 19.6 % — ABNORMAL HIGH (ref 11.5–15.5)
WBC: 15.6 10*3/uL — ABNORMAL HIGH (ref 4.0–10.5)
nRBC: 0.3 % — ABNORMAL HIGH (ref 0.0–0.2)

## 2018-11-02 MED ORDER — SODIUM CHLORIDE 0.9 % IV SOLN: INTRAVENOUS | Status: DC

## 2018-11-03 DIAGNOSIS — I959 Hypotension, unspecified: Secondary | ICD-10-CM | POA: Diagnosis not present

## 2018-11-03 DIAGNOSIS — J9601 Acute respiratory failure with hypoxia: Secondary | ICD-10-CM | POA: Diagnosis not present

## 2018-11-03 DIAGNOSIS — R0600 Dyspnea, unspecified: Secondary | ICD-10-CM | POA: Diagnosis not present

## 2018-11-03 DIAGNOSIS — K5792 Diverticulitis of intestine, part unspecified, without perforation or abscess without bleeding: Secondary | ICD-10-CM | POA: Diagnosis not present

## 2018-11-03 DIAGNOSIS — B20 Human immunodeficiency virus [HIV] disease: Secondary | ICD-10-CM | POA: Diagnosis not present

## 2018-11-03 DIAGNOSIS — D696 Thrombocytopenia, unspecified: Secondary | ICD-10-CM | POA: Diagnosis not present

## 2018-11-03 DIAGNOSIS — B259 Cytomegaloviral disease, unspecified: Secondary | ICD-10-CM | POA: Diagnosis not present

## 2018-11-03 DIAGNOSIS — R000 Tachycardia, unspecified: Secondary | ICD-10-CM | POA: Diagnosis not present

## 2018-11-03 DIAGNOSIS — R918 Other nonspecific abnormal finding of lung field: Secondary | ICD-10-CM | POA: Diagnosis not present

## 2018-11-03 DIAGNOSIS — D649 Anemia, unspecified: Secondary | ICD-10-CM | POA: Diagnosis not present

## 2018-11-03 DIAGNOSIS — D761 Hemophagocytic lymphohistiocytosis: Secondary | ICD-10-CM | POA: Diagnosis not present

## 2018-11-03 LAB — TYPE AND SCREEN
ABO/RH(D): O POS
Antibody Screen: NEGATIVE
Unit division: 0

## 2018-11-03 LAB — PREPARE PLATELET PHERESIS
Unit division: 0
Unit division: 0

## 2018-11-03 LAB — CULTURE, BLOOD (ROUTINE X 2)
Culture: NO GROWTH
Culture: NO GROWTH
Special Requests: ADEQUATE

## 2018-11-03 LAB — BPAM PLATELET PHERESIS
Blood Product Expiration Date: 202009252359
Blood Product Expiration Date: 202009252359
ISSUE DATE / TIME: 202009252108
ISSUE DATE / TIME: 202009252108
Unit Type and Rh: 5100
Unit Type and Rh: 6200

## 2018-11-03 LAB — BPAM RBC
Blood Product Expiration Date: 202010242359
ISSUE DATE / TIME: 202009251000
Unit Type and Rh: 5100

## 2018-11-03 MED ORDER — GUAIFENESIN 100 MG/5ML PO SYRP
200.00 | ORAL_SOLUTION | ORAL | Status: DC
Start: ? — End: 2018-11-03

## 2018-11-03 MED ORDER — ATOVAQUONE 750 MG/5ML PO SUSP
1500.00 | ORAL | Status: DC
Start: 2018-11-05 — End: 2018-11-03

## 2018-11-03 MED ORDER — MAGNESIUM OXIDE 400 MG PO TABS
400.00 | ORAL_TABLET | ORAL | Status: DC
Start: 2018-11-20 — End: 2018-11-03

## 2018-11-03 MED ORDER — NYSTATIN 100000 UNIT/ML MT SUSP
500000.00 | OROMUCOSAL | Status: DC
Start: 2018-11-27 — End: 2018-11-03

## 2018-11-03 MED ORDER — OXYCODONE HCL 5 MG PO TABS
5.00 | ORAL_TABLET | ORAL | Status: DC
Start: ? — End: 2018-11-03

## 2018-11-03 MED ORDER — AZITHROMYCIN 250 MG PO TABS
250.00 | ORAL_TABLET | ORAL | Status: DC
Start: 2018-11-03 — End: 2018-11-03

## 2018-11-03 MED ORDER — KETOROLAC TROMETHAMINE 15 MG/ML IJ SOLN
15.00 | INTRAMUSCULAR | Status: DC
Start: ? — End: 2018-11-03

## 2018-11-03 MED ORDER — CHLORHEXIDINE GLUCONATE 0.12 % MT SOLN
15.00 | OROMUCOSAL | Status: DC
Start: 2018-11-03 — End: 2018-11-03

## 2018-11-03 MED ORDER — GENERIC EXTERNAL MEDICATION
10.00 | Status: DC
Start: 2018-11-03 — End: 2018-11-03

## 2018-11-03 MED ORDER — MELATONIN 3 MG PO TABS
6.00 | ORAL_TABLET | ORAL | Status: DC
Start: ? — End: 2018-11-03

## 2018-11-03 MED ORDER — BENZONATATE 100 MG PO CAPS
100.00 | ORAL_CAPSULE | ORAL | Status: DC
Start: ? — End: 2018-11-03

## 2018-11-03 MED ORDER — FOLIC ACID 1 MG PO TABS
1.00 | ORAL_TABLET | ORAL | Status: DC
Start: 2018-12-07 — End: 2018-11-03

## 2018-11-03 MED ORDER — ACETAMINOPHEN 500 MG PO TABS
1000.00 | ORAL_TABLET | ORAL | Status: DC
Start: ? — End: 2018-11-03

## 2018-11-03 MED ORDER — BICTEGRAVIR-EMTRICITAB-TENOFOV 50-200-25 MG PO TABS
1.00 | ORAL_TABLET | ORAL | Status: DC
Start: 2018-11-23 — End: 2018-11-03

## 2018-11-03 MED ORDER — ONDANSETRON HCL 4 MG/2ML IJ SOLN
4.00 | INTRAMUSCULAR | Status: DC
Start: ? — End: 2018-11-03

## 2018-11-03 MED ORDER — SODIUM BICARBONATE 650 MG PO TABS
650.00 | ORAL_TABLET | ORAL | Status: DC
Start: 2018-11-03 — End: 2018-11-03

## 2018-11-03 MED ORDER — LIDOCAINE 4 % EX PTCH
1.00 | MEDICATED_PATCH | CUTANEOUS | Status: DC
Start: 2019-01-12 — End: 2018-11-03

## 2018-11-03 MED ORDER — ALBUTEROL SULFATE (2.5 MG/3ML) 0.083% IN NEBU
2.50 | INHALATION_SOLUTION | RESPIRATORY_TRACT | Status: DC
Start: ? — End: 2018-11-03

## 2018-11-04 DIAGNOSIS — H02203 Unspecified lagophthalmos right eye, unspecified eyelid: Secondary | ICD-10-CM | POA: Diagnosis not present

## 2018-11-04 DIAGNOSIS — Z9911 Dependence on respirator [ventilator] status: Secondary | ICD-10-CM | POA: Diagnosis not present

## 2018-11-04 DIAGNOSIS — N172 Acute kidney failure with medullary necrosis: Secondary | ICD-10-CM | POA: Diagnosis not present

## 2018-11-04 DIAGNOSIS — I959 Hypotension, unspecified: Secondary | ICD-10-CM | POA: Diagnosis not present

## 2018-11-04 DIAGNOSIS — D62 Acute posthemorrhagic anemia: Secondary | ICD-10-CM | POA: Diagnosis not present

## 2018-11-04 DIAGNOSIS — E872 Acidosis: Secondary | ICD-10-CM | POA: Diagnosis not present

## 2018-11-04 DIAGNOSIS — D761 Hemophagocytic lymphohistiocytosis: Secondary | ICD-10-CM | POA: Diagnosis not present

## 2018-11-04 DIAGNOSIS — N179 Acute kidney failure, unspecified: Secondary | ICD-10-CM | POA: Diagnosis not present

## 2018-11-04 DIAGNOSIS — R579 Shock, unspecified: Secondary | ICD-10-CM | POA: Diagnosis not present

## 2018-11-04 DIAGNOSIS — E875 Hyperkalemia: Secondary | ICD-10-CM | POA: Diagnosis not present

## 2018-11-04 DIAGNOSIS — J9601 Acute respiratory failure with hypoxia: Secondary | ICD-10-CM | POA: Diagnosis not present

## 2018-11-04 DIAGNOSIS — H189 Unspecified disorder of cornea: Secondary | ICD-10-CM | POA: Diagnosis not present

## 2018-11-04 DIAGNOSIS — B20 Human immunodeficiency virus [HIV] disease: Secondary | ICD-10-CM | POA: Diagnosis not present

## 2018-11-04 DIAGNOSIS — Z4659 Encounter for fitting and adjustment of other gastrointestinal appliance and device: Secondary | ICD-10-CM | POA: Diagnosis not present

## 2018-11-04 DIAGNOSIS — H02206 Unspecified lagophthalmos left eye, unspecified eyelid: Secondary | ICD-10-CM | POA: Diagnosis not present

## 2018-11-04 DIAGNOSIS — H3562 Retinal hemorrhage, left eye: Secondary | ICD-10-CM | POA: Diagnosis not present

## 2018-11-04 DIAGNOSIS — K5792 Diverticulitis of intestine, part unspecified, without perforation or abscess without bleeding: Secondary | ICD-10-CM | POA: Diagnosis not present

## 2018-11-05 ENCOUNTER — Ambulatory Visit: Payer: 59 | Admitting: Family

## 2018-11-05 ENCOUNTER — Telehealth: Payer: Self-pay

## 2018-11-05 DIAGNOSIS — N172 Acute kidney failure with medullary necrosis: Secondary | ICD-10-CM | POA: Diagnosis not present

## 2018-11-05 DIAGNOSIS — J9601 Acute respiratory failure with hypoxia: Secondary | ICD-10-CM | POA: Diagnosis not present

## 2018-11-05 DIAGNOSIS — K5792 Diverticulitis of intestine, part unspecified, without perforation or abscess without bleeding: Secondary | ICD-10-CM | POA: Diagnosis not present

## 2018-11-05 DIAGNOSIS — D761 Hemophagocytic lymphohistiocytosis: Secondary | ICD-10-CM | POA: Diagnosis not present

## 2018-11-05 DIAGNOSIS — Z9911 Dependence on respirator [ventilator] status: Secondary | ICD-10-CM | POA: Diagnosis not present

## 2018-11-05 DIAGNOSIS — D62 Acute posthemorrhagic anemia: Secondary | ICD-10-CM | POA: Diagnosis not present

## 2018-11-05 DIAGNOSIS — B259 Cytomegaloviral disease, unspecified: Secondary | ICD-10-CM | POA: Diagnosis not present

## 2018-11-05 DIAGNOSIS — B20 Human immunodeficiency virus [HIV] disease: Secondary | ICD-10-CM | POA: Diagnosis not present

## 2018-11-05 DIAGNOSIS — E875 Hyperkalemia: Secondary | ICD-10-CM | POA: Diagnosis not present

## 2018-11-05 DIAGNOSIS — R579 Shock, unspecified: Secondary | ICD-10-CM | POA: Diagnosis not present

## 2018-11-05 DIAGNOSIS — N179 Acute kidney failure, unspecified: Secondary | ICD-10-CM | POA: Diagnosis not present

## 2018-11-05 MED ORDER — EPINEPHRINE PF 1 MG/ML IJ SOLN
0.30 | INTRAMUSCULAR | Status: DC
Start: ? — End: 2018-11-05

## 2018-11-05 MED ORDER — DIPHENHYDRAMINE HCL 50 MG/ML IJ SOLN
25.00 | INTRAMUSCULAR | Status: DC
Start: ? — End: 2018-11-05

## 2018-11-05 MED ORDER — GLUCOSE 40 % PO GEL
15.00 | ORAL | Status: DC
Start: ? — End: 2018-11-05

## 2018-11-05 MED ORDER — GENERIC EXTERNAL MEDICATION
Status: DC
Start: ? — End: 2018-11-05

## 2018-11-05 MED ORDER — SODIUM CHLORIDE 0.9 % IV SOLN
INTRAVENOUS | Status: DC
Start: ? — End: 2018-11-05

## 2018-11-05 MED ORDER — GENERIC EXTERNAL MEDICATION
10.00 | Status: DC
Start: 2018-11-16 — End: 2018-11-05

## 2018-11-05 MED ORDER — GENERIC EXTERNAL MEDICATION
3.38 | Status: DC
Start: 2018-11-08 — End: 2018-11-05

## 2018-11-05 MED ORDER — NOREPINEPHRINE-SODIUM CHLORIDE 16-0.9 MG/250ML-% IV SOLN
0.00 | INTRAVENOUS | Status: DC
Start: ? — End: 2018-11-05

## 2018-11-05 MED ORDER — AZITHROMYCIN 600 MG PO TABS
600.00 | ORAL_TABLET | ORAL | Status: DC
Start: 2018-12-16 — End: 2018-11-05

## 2018-11-05 MED ORDER — GENERIC EXTERNAL MEDICATION
40.00 | Status: DC
Start: 2018-12-02 — End: 2018-11-05

## 2018-11-05 MED ORDER — METHYLPREDNISOLONE SODIUM SUCC 125 MG IJ SOLR
125.00 | INTRAMUSCULAR | Status: DC
Start: ? — End: 2018-11-05

## 2018-11-05 MED ORDER — FENTANYL CITRATE (PF) 50 MCG/ML IJ SOLN
25.00 | INTRAMUSCULAR | Status: DC
Start: ? — End: 2018-11-05

## 2018-11-05 MED ORDER — DEXTROSE 10 % IV SOLN
125.00 | INTRAVENOUS | Status: DC
Start: ? — End: 2018-11-05

## 2018-11-05 MED ORDER — CHLORHEXIDINE GLUCONATE 0.12 % MT SOLN
15.00 | OROMUCOSAL | Status: DC
Start: ? — End: 2018-11-05

## 2018-11-05 MED ORDER — FENTANYL CITRATE (PF) 50 MCG/ML IJ SOLN
0.00 | INTRAMUSCULAR | Status: DC
Start: ? — End: 2018-11-05

## 2018-11-05 MED ORDER — GENERIC EXTERNAL MEDICATION
15.00 L | Status: DC
Start: ? — End: 2018-11-05

## 2018-11-05 MED ORDER — CHLORHEXIDINE GLUCONATE 0.12 % MT SOLN
15.00 | OROMUCOSAL | Status: DC
Start: 2018-11-08 — End: 2018-11-05

## 2018-11-05 NOTE — Telephone Encounter (Signed)
Patient walked into clinic today to drop off attending physician statement for long term disability. Terri Piedra, FNP was able to fill out form for patient. Husband would like form and office notes faxed to [facility] who is running claim. Faxed sent and confirmed. Fax: [phone]

## 2018-11-06 DIAGNOSIS — J9601 Acute respiratory failure with hypoxia: Secondary | ICD-10-CM | POA: Diagnosis not present

## 2018-11-06 DIAGNOSIS — R6521 Severe sepsis with septic shock: Secondary | ICD-10-CM | POA: Diagnosis not present

## 2018-11-06 DIAGNOSIS — J90 Pleural effusion, not elsewhere classified: Secondary | ICD-10-CM | POA: Diagnosis not present

## 2018-11-06 DIAGNOSIS — R590 Localized enlarged lymph nodes: Secondary | ICD-10-CM | POA: Diagnosis not present

## 2018-11-06 DIAGNOSIS — B259 Cytomegaloviral disease, unspecified: Secondary | ICD-10-CM | POA: Diagnosis not present

## 2018-11-06 DIAGNOSIS — D761 Hemophagocytic lymphohistiocytosis: Secondary | ICD-10-CM | POA: Diagnosis not present

## 2018-11-06 DIAGNOSIS — A419 Sepsis, unspecified organism: Secondary | ICD-10-CM | POA: Diagnosis not present

## 2018-11-06 DIAGNOSIS — R9431 Abnormal electrocardiogram [ECG] [EKG]: Secondary | ICD-10-CM | POA: Diagnosis not present

## 2018-11-06 DIAGNOSIS — K5792 Diverticulitis of intestine, part unspecified, without perforation or abscess without bleeding: Secondary | ICD-10-CM | POA: Diagnosis not present

## 2018-11-06 DIAGNOSIS — D899 Disorder involving the immune mechanism, unspecified: Secondary | ICD-10-CM | POA: Diagnosis not present

## 2018-11-06 DIAGNOSIS — A0839 Other viral enteritis: Secondary | ICD-10-CM | POA: Diagnosis not present

## 2018-11-06 DIAGNOSIS — N179 Acute kidney failure, unspecified: Secondary | ICD-10-CM | POA: Diagnosis not present

## 2018-11-06 DIAGNOSIS — J189 Pneumonia, unspecified organism: Secondary | ICD-10-CM | POA: Diagnosis not present

## 2018-11-06 DIAGNOSIS — R000 Tachycardia, unspecified: Secondary | ICD-10-CM | POA: Diagnosis not present

## 2018-11-06 DIAGNOSIS — R093 Abnormal sputum: Secondary | ICD-10-CM | POA: Diagnosis not present

## 2018-11-06 DIAGNOSIS — D696 Thrombocytopenia, unspecified: Secondary | ICD-10-CM | POA: Diagnosis not present

## 2018-11-06 DIAGNOSIS — B20 Human immunodeficiency virus [HIV] disease: Secondary | ICD-10-CM | POA: Diagnosis not present

## 2018-11-06 DIAGNOSIS — R933 Abnormal findings on diagnostic imaging of other parts of digestive tract: Secondary | ICD-10-CM | POA: Diagnosis not present

## 2018-11-06 DIAGNOSIS — R918 Other nonspecific abnormal finding of lung field: Secondary | ICD-10-CM | POA: Diagnosis not present

## 2018-11-06 MED ORDER — GENERIC EXTERNAL MEDICATION
Status: DC
Start: ? — End: 2018-11-06

## 2018-11-06 MED ORDER — SODIUM CHLORIDE 0.9 % IV SOLN
250.00 | INTRAVENOUS | Status: DC
Start: 2018-11-06 — End: 2018-11-06

## 2018-11-06 MED ORDER — GENERIC EXTERNAL MEDICATION
140.00 | Status: DC
Start: 2018-11-08 — End: 2018-11-06

## 2018-11-06 MED ORDER — ERYTHROMYCIN 5 MG/GM OP OINT
TOPICAL_OINTMENT | OPHTHALMIC | Status: DC
Start: 2019-01-11 — End: 2018-11-06

## 2018-11-06 MED ORDER — SULFAMETHOXAZOLE-TRIMETHOPRIM 200-40 MG/5ML PO SUSP
10.00 | ORAL | Status: DC
Start: 2018-11-15 — End: 2018-11-06

## 2018-11-07 DIAGNOSIS — J9601 Acute respiratory failure with hypoxia: Secondary | ICD-10-CM | POA: Diagnosis not present

## 2018-11-07 DIAGNOSIS — R590 Localized enlarged lymph nodes: Secondary | ICD-10-CM | POA: Diagnosis not present

## 2018-11-07 DIAGNOSIS — R9431 Abnormal electrocardiogram [ECG] [EKG]: Secondary | ICD-10-CM | POA: Diagnosis not present

## 2018-11-07 DIAGNOSIS — R093 Abnormal sputum: Secondary | ICD-10-CM | POA: Diagnosis not present

## 2018-11-07 DIAGNOSIS — D899 Disorder involving the immune mechanism, unspecified: Secondary | ICD-10-CM | POA: Diagnosis not present

## 2018-11-07 DIAGNOSIS — D696 Thrombocytopenia, unspecified: Secondary | ICD-10-CM | POA: Diagnosis not present

## 2018-11-07 DIAGNOSIS — B20 Human immunodeficiency virus [HIV] disease: Secondary | ICD-10-CM | POA: Diagnosis not present

## 2018-11-07 DIAGNOSIS — R933 Abnormal findings on diagnostic imaging of other parts of digestive tract: Secondary | ICD-10-CM | POA: Diagnosis not present

## 2018-11-07 DIAGNOSIS — B259 Cytomegaloviral disease, unspecified: Secondary | ICD-10-CM | POA: Diagnosis not present

## 2018-11-07 DIAGNOSIS — R000 Tachycardia, unspecified: Secondary | ICD-10-CM | POA: Diagnosis not present

## 2018-11-07 DIAGNOSIS — R918 Other nonspecific abnormal finding of lung field: Secondary | ICD-10-CM | POA: Diagnosis not present

## 2018-11-07 DIAGNOSIS — N179 Acute kidney failure, unspecified: Secondary | ICD-10-CM | POA: Diagnosis not present

## 2018-11-07 DIAGNOSIS — D649 Anemia, unspecified: Secondary | ICD-10-CM | POA: Diagnosis not present

## 2018-11-07 DIAGNOSIS — I10 Essential (primary) hypertension: Secondary | ICD-10-CM | POA: Diagnosis not present

## 2018-11-07 DIAGNOSIS — D761 Hemophagocytic lymphohistiocytosis: Secondary | ICD-10-CM | POA: Diagnosis not present

## 2018-11-08 DIAGNOSIS — J158 Pneumonia due to other specified bacteria: Secondary | ICD-10-CM | POA: Diagnosis not present

## 2018-11-08 DIAGNOSIS — D761 Hemophagocytic lymphohistiocytosis: Secondary | ICD-10-CM | POA: Diagnosis not present

## 2018-11-08 DIAGNOSIS — R590 Localized enlarged lymph nodes: Secondary | ICD-10-CM | POA: Diagnosis not present

## 2018-11-08 DIAGNOSIS — R918 Other nonspecific abnormal finding of lung field: Secondary | ICD-10-CM | POA: Diagnosis not present

## 2018-11-08 DIAGNOSIS — J9601 Acute respiratory failure with hypoxia: Secondary | ICD-10-CM | POA: Diagnosis not present

## 2018-11-08 DIAGNOSIS — R9431 Abnormal electrocardiogram [ECG] [EKG]: Secondary | ICD-10-CM | POA: Diagnosis not present

## 2018-11-08 DIAGNOSIS — R933 Abnormal findings on diagnostic imaging of other parts of digestive tract: Secondary | ICD-10-CM | POA: Diagnosis not present

## 2018-11-08 DIAGNOSIS — H3562 Retinal hemorrhage, left eye: Secondary | ICD-10-CM | POA: Diagnosis not present

## 2018-11-08 DIAGNOSIS — D899 Disorder involving the immune mechanism, unspecified: Secondary | ICD-10-CM | POA: Diagnosis not present

## 2018-11-08 DIAGNOSIS — B259 Cytomegaloviral disease, unspecified: Secondary | ICD-10-CM | POA: Diagnosis not present

## 2018-11-08 DIAGNOSIS — R093 Abnormal sputum: Secondary | ICD-10-CM | POA: Diagnosis not present

## 2018-11-08 DIAGNOSIS — N179 Acute kidney failure, unspecified: Secondary | ICD-10-CM | POA: Diagnosis not present

## 2018-11-08 DIAGNOSIS — I10 Essential (primary) hypertension: Secondary | ICD-10-CM | POA: Diagnosis not present

## 2018-11-08 DIAGNOSIS — D649 Anemia, unspecified: Secondary | ICD-10-CM | POA: Diagnosis not present

## 2018-11-08 DIAGNOSIS — D696 Thrombocytopenia, unspecified: Secondary | ICD-10-CM | POA: Diagnosis not present

## 2018-11-08 DIAGNOSIS — R000 Tachycardia, unspecified: Secondary | ICD-10-CM | POA: Diagnosis not present

## 2018-11-08 DIAGNOSIS — B20 Human immunodeficiency virus [HIV] disease: Secondary | ICD-10-CM | POA: Diagnosis not present

## 2018-11-08 MED ORDER — HYDRALAZINE HCL 20 MG/ML IJ SOLN
10.00 | INTRAMUSCULAR | Status: DC
Start: ? — End: 2018-11-08

## 2018-11-08 MED ORDER — DOXYCYCLINE HYCLATE 100 MG PO TABS
100.00 | ORAL_TABLET | ORAL | Status: DC
Start: 2018-11-15 — End: 2018-11-08

## 2018-11-08 MED ORDER — FENTANYL CITRATE (PF) 50 MCG/ML IJ SOLN
0.00 | INTRAMUSCULAR | Status: DC
Start: ? — End: 2018-11-08

## 2018-11-08 MED ORDER — GENERIC EXTERNAL MEDICATION
Status: DC
Start: ? — End: 2018-11-08

## 2018-11-08 MED ORDER — OXYCODONE HCL 5 MG PO TABS
5.00 | ORAL_TABLET | ORAL | Status: DC
Start: 2018-11-08 — End: 2018-11-08

## 2018-11-08 MED ORDER — GENERIC EXTERNAL MEDICATION
5.00 | Status: DC
Start: ? — End: 2018-11-08

## 2018-11-08 MED ORDER — GENERIC EXTERNAL MEDICATION
12.50 | Status: DC
Start: 2018-11-08 — End: 2018-11-08

## 2018-11-09 DIAGNOSIS — J158 Pneumonia due to other specified bacteria: Secondary | ICD-10-CM | POA: Diagnosis not present

## 2018-11-09 DIAGNOSIS — R9431 Abnormal electrocardiogram [ECG] [EKG]: Secondary | ICD-10-CM | POA: Diagnosis not present

## 2018-11-09 DIAGNOSIS — I10 Essential (primary) hypertension: Secondary | ICD-10-CM | POA: Diagnosis not present

## 2018-11-09 DIAGNOSIS — R093 Abnormal sputum: Secondary | ICD-10-CM | POA: Diagnosis not present

## 2018-11-09 DIAGNOSIS — B259 Cytomegaloviral disease, unspecified: Secondary | ICD-10-CM | POA: Diagnosis not present

## 2018-11-09 DIAGNOSIS — R590 Localized enlarged lymph nodes: Secondary | ICD-10-CM | POA: Diagnosis not present

## 2018-11-09 DIAGNOSIS — D761 Hemophagocytic lymphohistiocytosis: Secondary | ICD-10-CM | POA: Diagnosis not present

## 2018-11-09 DIAGNOSIS — J9601 Acute respiratory failure with hypoxia: Secondary | ICD-10-CM | POA: Diagnosis not present

## 2018-11-09 DIAGNOSIS — R918 Other nonspecific abnormal finding of lung field: Secondary | ICD-10-CM | POA: Diagnosis not present

## 2018-11-09 DIAGNOSIS — N179 Acute kidney failure, unspecified: Secondary | ICD-10-CM | POA: Diagnosis not present

## 2018-11-09 DIAGNOSIS — B20 Human immunodeficiency virus [HIV] disease: Secondary | ICD-10-CM | POA: Diagnosis not present

## 2018-11-10 DIAGNOSIS — N049 Nephrotic syndrome with unspecified morphologic changes: Secondary | ICD-10-CM | POA: Diagnosis not present

## 2018-11-10 DIAGNOSIS — H3562 Retinal hemorrhage, left eye: Secondary | ICD-10-CM | POA: Diagnosis not present

## 2018-11-10 DIAGNOSIS — D761 Hemophagocytic lymphohistiocytosis: Secondary | ICD-10-CM | POA: Diagnosis not present

## 2018-11-10 DIAGNOSIS — I953 Hypotension of hemodialysis: Secondary | ICD-10-CM | POA: Diagnosis not present

## 2018-11-10 DIAGNOSIS — N179 Acute kidney failure, unspecified: Secondary | ICD-10-CM | POA: Diagnosis not present

## 2018-11-10 DIAGNOSIS — D696 Thrombocytopenia, unspecified: Secondary | ICD-10-CM | POA: Diagnosis not present

## 2018-11-10 DIAGNOSIS — J9601 Acute respiratory failure with hypoxia: Secondary | ICD-10-CM | POA: Diagnosis not present

## 2018-11-10 DIAGNOSIS — E875 Hyperkalemia: Secondary | ICD-10-CM | POA: Diagnosis not present

## 2018-11-10 DIAGNOSIS — B20 Human immunodeficiency virus [HIV] disease: Secondary | ICD-10-CM | POA: Diagnosis not present

## 2018-11-10 DIAGNOSIS — R9431 Abnormal electrocardiogram [ECG] [EKG]: Secondary | ICD-10-CM | POA: Diagnosis not present

## 2018-11-10 DIAGNOSIS — E8770 Fluid overload, unspecified: Secondary | ICD-10-CM | POA: Diagnosis not present

## 2018-11-10 DIAGNOSIS — J158 Pneumonia due to other specified bacteria: Secondary | ICD-10-CM | POA: Diagnosis not present

## 2018-11-11 DIAGNOSIS — B20 Human immunodeficiency virus [HIV] disease: Secondary | ICD-10-CM | POA: Diagnosis not present

## 2018-11-11 DIAGNOSIS — D761 Hemophagocytic lymphohistiocytosis: Secondary | ICD-10-CM | POA: Diagnosis not present

## 2018-11-11 DIAGNOSIS — D696 Thrombocytopenia, unspecified: Secondary | ICD-10-CM | POA: Diagnosis not present

## 2018-11-11 DIAGNOSIS — N179 Acute kidney failure, unspecified: Secondary | ICD-10-CM | POA: Diagnosis not present

## 2018-11-11 DIAGNOSIS — D649 Anemia, unspecified: Secondary | ICD-10-CM | POA: Diagnosis not present

## 2018-11-11 MED ORDER — GENERIC EXTERNAL MEDICATION
12.50 | Status: DC
Start: 2018-11-19 — End: 2018-11-11

## 2018-11-11 MED ORDER — OXYCODONE HCL 5 MG PO TABS
5.00 | ORAL_TABLET | ORAL | Status: DC
Start: ? — End: 2018-11-11

## 2018-11-11 MED ORDER — GENERIC EXTERNAL MEDICATION
Status: DC
Start: ? — End: 2018-11-11

## 2018-11-11 MED ORDER — CARBOXYMETHYLCELLULOSE SODIUM 0.5 % OP SOLN
1.00 | OPHTHALMIC | Status: DC
Start: 2019-01-11 — End: 2018-11-11

## 2018-11-11 MED ORDER — REFRESH LACRI-LUBE OP OINT
TOPICAL_OINTMENT | OPHTHALMIC | Status: DC
Start: 2019-01-11 — End: 2018-11-11

## 2018-11-12 DIAGNOSIS — N179 Acute kidney failure, unspecified: Secondary | ICD-10-CM | POA: Diagnosis not present

## 2018-11-12 DIAGNOSIS — D696 Thrombocytopenia, unspecified: Secondary | ICD-10-CM | POA: Diagnosis not present

## 2018-11-12 DIAGNOSIS — N049 Nephrotic syndrome with unspecified morphologic changes: Secondary | ICD-10-CM | POA: Diagnosis not present

## 2018-11-12 DIAGNOSIS — R34 Anuria and oliguria: Secondary | ICD-10-CM | POA: Diagnosis not present

## 2018-11-12 DIAGNOSIS — B20 Human immunodeficiency virus [HIV] disease: Secondary | ICD-10-CM | POA: Diagnosis not present

## 2018-11-12 DIAGNOSIS — J158 Pneumonia due to other specified bacteria: Secondary | ICD-10-CM | POA: Diagnosis not present

## 2018-11-12 DIAGNOSIS — D61818 Other pancytopenia: Secondary | ICD-10-CM | POA: Diagnosis not present

## 2018-11-12 DIAGNOSIS — D649 Anemia, unspecified: Secondary | ICD-10-CM | POA: Diagnosis not present

## 2018-11-12 DIAGNOSIS — D761 Hemophagocytic lymphohistiocytosis: Secondary | ICD-10-CM | POA: Diagnosis not present

## 2018-11-12 DIAGNOSIS — R809 Proteinuria, unspecified: Secondary | ICD-10-CM | POA: Diagnosis not present

## 2018-11-12 MED ORDER — SODIUM CHLORIDE 0.9 % IV SOLN
250.00 | INTRAVENOUS | Status: DC
Start: ? — End: 2018-11-12

## 2018-11-12 MED ORDER — GENERIC EXTERNAL MEDICATION
160.00 | Status: DC
Start: 2018-11-12 — End: 2018-11-12

## 2018-11-12 MED ORDER — FLUCONAZOLE 200 MG PO TABS
200.00 | ORAL_TABLET | ORAL | Status: DC
Start: 2018-11-23 — End: 2018-11-12

## 2018-11-12 MED ORDER — GENERIC EXTERNAL MEDICATION
Status: DC
Start: ? — End: 2018-11-12

## 2018-11-13 DIAGNOSIS — D761 Hemophagocytic lymphohistiocytosis: Secondary | ICD-10-CM | POA: Diagnosis not present

## 2018-11-13 DIAGNOSIS — B20 Human immunodeficiency virus [HIV] disease: Secondary | ICD-10-CM | POA: Diagnosis not present

## 2018-11-13 DIAGNOSIS — N179 Acute kidney failure, unspecified: Secondary | ICD-10-CM | POA: Diagnosis not present

## 2018-11-13 DIAGNOSIS — D696 Thrombocytopenia, unspecified: Secondary | ICD-10-CM | POA: Diagnosis not present

## 2018-11-13 MED ORDER — GENERIC EXTERNAL MEDICATION
2.50 | Status: DC
Start: 2018-11-14 — End: 2018-11-13

## 2018-11-13 MED ORDER — GENERIC EXTERNAL MEDICATION
Status: DC
Start: ? — End: 2018-11-13

## 2018-11-13 MED ORDER — GENERIC EXTERNAL MEDICATION
160.00 | Status: DC
Start: 2018-11-13 — End: 2018-11-13

## 2018-11-13 MED ORDER — MAGNESIUM SULFATE 2 GM/50ML IV SOLN
2.00 | INTRAVENOUS | Status: DC
Start: 2018-11-13 — End: 2018-11-13

## 2018-11-14 DIAGNOSIS — D696 Thrombocytopenia, unspecified: Secondary | ICD-10-CM | POA: Diagnosis not present

## 2018-11-14 DIAGNOSIS — B259 Cytomegaloviral disease, unspecified: Secondary | ICD-10-CM | POA: Diagnosis not present

## 2018-11-14 DIAGNOSIS — B20 Human immunodeficiency virus [HIV] disease: Secondary | ICD-10-CM | POA: Diagnosis not present

## 2018-11-14 DIAGNOSIS — D761 Hemophagocytic lymphohistiocytosis: Secondary | ICD-10-CM | POA: Diagnosis not present

## 2018-11-14 DIAGNOSIS — N179 Acute kidney failure, unspecified: Secondary | ICD-10-CM | POA: Diagnosis not present

## 2018-11-14 DIAGNOSIS — A0839 Other viral enteritis: Secondary | ICD-10-CM | POA: Diagnosis not present

## 2018-11-14 MED ORDER — GENERIC EXTERNAL MEDICATION
Status: DC
Start: ? — End: 2018-11-14

## 2018-11-15 DIAGNOSIS — D696 Thrombocytopenia, unspecified: Secondary | ICD-10-CM | POA: Diagnosis not present

## 2018-11-15 DIAGNOSIS — J9601 Acute respiratory failure with hypoxia: Secondary | ICD-10-CM | POA: Diagnosis not present

## 2018-11-15 DIAGNOSIS — D761 Hemophagocytic lymphohistiocytosis: Secondary | ICD-10-CM | POA: Diagnosis not present

## 2018-11-15 DIAGNOSIS — R6521 Severe sepsis with septic shock: Secondary | ICD-10-CM | POA: Diagnosis not present

## 2018-11-15 DIAGNOSIS — B259 Cytomegaloviral disease, unspecified: Secondary | ICD-10-CM | POA: Diagnosis not present

## 2018-11-15 DIAGNOSIS — R571 Hypovolemic shock: Secondary | ICD-10-CM | POA: Diagnosis not present

## 2018-11-15 DIAGNOSIS — B20 Human immunodeficiency virus [HIV] disease: Secondary | ICD-10-CM | POA: Diagnosis not present

## 2018-11-15 MED ORDER — SODIUM CHLORIDE 0.9 % IV SOLN
250.00 | INTRAVENOUS | Status: DC
Start: ? — End: 2018-11-15

## 2018-11-15 MED ORDER — GENERIC EXTERNAL MEDICATION
5.00 | Status: DC
Start: 2018-11-19 — End: 2018-11-15

## 2018-11-15 MED ORDER — SULFAMETHOXAZOLE-TRIMETHOPRIM 800-160 MG PO TABS
1.00 | ORAL_TABLET | ORAL | Status: DC
Start: 2018-11-23 — End: 2018-11-15

## 2018-11-15 MED ORDER — OXYMETAZOLINE HCL 0.05 % NA SOLN
2.00 | NASAL | Status: DC
Start: ? — End: 2018-11-15

## 2018-11-15 MED ORDER — GENERIC EXTERNAL MEDICATION
Status: DC
Start: ? — End: 2018-11-15

## 2018-11-17 DIAGNOSIS — B259 Cytomegaloviral disease, unspecified: Secondary | ICD-10-CM | POA: Diagnosis not present

## 2018-11-17 DIAGNOSIS — R571 Hypovolemic shock: Secondary | ICD-10-CM | POA: Diagnosis not present

## 2018-11-17 DIAGNOSIS — R6521 Severe sepsis with septic shock: Secondary | ICD-10-CM | POA: Diagnosis not present

## 2018-11-17 DIAGNOSIS — D761 Hemophagocytic lymphohistiocytosis: Secondary | ICD-10-CM | POA: Diagnosis not present

## 2018-11-17 DIAGNOSIS — D696 Thrombocytopenia, unspecified: Secondary | ICD-10-CM | POA: Diagnosis not present

## 2018-11-17 DIAGNOSIS — J9601 Acute respiratory failure with hypoxia: Secondary | ICD-10-CM | POA: Diagnosis not present

## 2018-11-17 DIAGNOSIS — B20 Human immunodeficiency virus [HIV] disease: Secondary | ICD-10-CM | POA: Diagnosis not present

## 2018-11-18 DIAGNOSIS — B20 Human immunodeficiency virus [HIV] disease: Secondary | ICD-10-CM | POA: Diagnosis not present

## 2018-11-18 DIAGNOSIS — D761 Hemophagocytic lymphohistiocytosis: Secondary | ICD-10-CM | POA: Diagnosis not present

## 2018-11-18 DIAGNOSIS — D6489 Other specified anemias: Secondary | ICD-10-CM | POA: Diagnosis not present

## 2018-11-18 DIAGNOSIS — D696 Thrombocytopenia, unspecified: Secondary | ICD-10-CM | POA: Diagnosis not present

## 2018-11-18 MED ORDER — GENERIC EXTERNAL MEDICATION
8.00 | Status: DC
Start: 2018-11-18 — End: 2018-11-18

## 2018-11-18 MED ORDER — POTASSIUM CHLORIDE 20 MEQ/100ML IV SOLN
20.00 | INTRAVENOUS | Status: DC
Start: 2018-11-18 — End: 2018-11-18

## 2018-11-18 MED ORDER — GENERIC EXTERNAL MEDICATION
Status: DC
Start: ? — End: 2018-11-18

## 2018-11-18 MED ORDER — DEXAMETHASONE 4 MG PO TABS
8.00 | ORAL_TABLET | ORAL | Status: DC
Start: 2018-11-28 — End: 2018-11-18

## 2018-11-19 ENCOUNTER — Inpatient Hospital Stay: Payer: 59 | Admitting: Pulmonary Disease

## 2018-11-19 DIAGNOSIS — B259 Cytomegaloviral disease, unspecified: Secondary | ICD-10-CM | POA: Diagnosis not present

## 2018-11-19 DIAGNOSIS — R5081 Fever presenting with conditions classified elsewhere: Secondary | ICD-10-CM | POA: Diagnosis not present

## 2018-11-19 DIAGNOSIS — D709 Neutropenia, unspecified: Secondary | ICD-10-CM | POA: Diagnosis not present

## 2018-11-19 DIAGNOSIS — L89319 Pressure ulcer of right buttock, unspecified stage: Secondary | ICD-10-CM | POA: Diagnosis not present

## 2018-11-19 DIAGNOSIS — L89329 Pressure ulcer of left buttock, unspecified stage: Secondary | ICD-10-CM | POA: Diagnosis not present

## 2018-11-19 DIAGNOSIS — R109 Unspecified abdominal pain: Secondary | ICD-10-CM | POA: Diagnosis not present

## 2018-11-19 DIAGNOSIS — Z4682 Encounter for fitting and adjustment of non-vascular catheter: Secondary | ICD-10-CM | POA: Diagnosis not present

## 2018-11-19 DIAGNOSIS — B20 Human immunodeficiency virus [HIV] disease: Secondary | ICD-10-CM | POA: Diagnosis not present

## 2018-11-19 DIAGNOSIS — N179 Acute kidney failure, unspecified: Secondary | ICD-10-CM | POA: Diagnosis not present

## 2018-11-19 DIAGNOSIS — A0839 Other viral enteritis: Secondary | ICD-10-CM | POA: Diagnosis not present

## 2018-11-19 DIAGNOSIS — D696 Thrombocytopenia, unspecified: Secondary | ICD-10-CM | POA: Diagnosis not present

## 2018-11-19 DIAGNOSIS — L258 Unspecified contact dermatitis due to other agents: Secondary | ICD-10-CM | POA: Diagnosis not present

## 2018-11-19 DIAGNOSIS — J9601 Acute respiratory failure with hypoxia: Secondary | ICD-10-CM | POA: Diagnosis not present

## 2018-11-19 DIAGNOSIS — D761 Hemophagocytic lymphohistiocytosis: Secondary | ICD-10-CM | POA: Diagnosis not present

## 2018-11-19 DIAGNOSIS — R579 Shock, unspecified: Secondary | ICD-10-CM | POA: Diagnosis not present

## 2018-11-19 DIAGNOSIS — K625 Hemorrhage of anus and rectum: Secondary | ICD-10-CM | POA: Diagnosis not present

## 2018-11-19 DIAGNOSIS — D6489 Other specified anemias: Secondary | ICD-10-CM | POA: Diagnosis not present

## 2018-11-19 MED ORDER — GENERIC EXTERNAL MEDICATION
Status: DC
Start: ? — End: 2018-11-19

## 2018-11-19 MED ORDER — SODIUM CHLORIDE 0.9 % IV SOLN
250.00 | INTRAVENOUS | Status: DC
Start: ? — End: 2018-11-19

## 2018-11-19 MED ORDER — GLUCOSE 40 % PO GEL
15.00 | ORAL | Status: DC
Start: ? — End: 2018-11-19

## 2018-11-19 MED ORDER — DEXTROSE 10 % IV SOLN
125.00 | INTRAVENOUS | Status: DC
Start: ? — End: 2018-11-19

## 2018-11-19 MED ORDER — GENERIC EXTERNAL MEDICATION
2.00 | Status: DC
Start: 2018-11-20 — End: 2018-11-19

## 2018-11-19 MED ORDER — MAGNESIUM SULFATE 2 GM/50ML IV SOLN
2.00 | INTRAVENOUS | Status: DC
Start: 2018-11-19 — End: 2018-11-19

## 2018-11-19 MED ORDER — VANCOMYCIN HCL IN NACL 750-0.9 MG/150ML-% IV SOLN
15.00 | INTRAVENOUS | Status: DC
Start: 2018-11-19 — End: 2018-11-19

## 2018-11-20 DIAGNOSIS — D761 Hemophagocytic lymphohistiocytosis: Secondary | ICD-10-CM | POA: Diagnosis not present

## 2018-11-20 DIAGNOSIS — B259 Cytomegaloviral disease, unspecified: Secondary | ICD-10-CM | POA: Diagnosis not present

## 2018-11-20 DIAGNOSIS — R579 Shock, unspecified: Secondary | ICD-10-CM | POA: Diagnosis not present

## 2018-11-20 DIAGNOSIS — B20 Human immunodeficiency virus [HIV] disease: Secondary | ICD-10-CM | POA: Diagnosis not present

## 2018-11-20 DIAGNOSIS — A419 Sepsis, unspecified organism: Secondary | ICD-10-CM | POA: Diagnosis not present

## 2018-11-20 DIAGNOSIS — R7881 Bacteremia: Secondary | ICD-10-CM | POA: Diagnosis not present

## 2018-11-20 DIAGNOSIS — R5081 Fever presenting with conditions classified elsewhere: Secondary | ICD-10-CM | POA: Diagnosis not present

## 2018-11-20 DIAGNOSIS — N179 Acute kidney failure, unspecified: Secondary | ICD-10-CM | POA: Diagnosis not present

## 2018-11-20 DIAGNOSIS — J189 Pneumonia, unspecified organism: Secondary | ICD-10-CM | POA: Diagnosis not present

## 2018-11-20 DIAGNOSIS — R578 Other shock: Secondary | ICD-10-CM | POA: Diagnosis not present

## 2018-11-20 DIAGNOSIS — Z4659 Encounter for fitting and adjustment of other gastrointestinal appliance and device: Secondary | ICD-10-CM | POA: Diagnosis not present

## 2018-11-20 DIAGNOSIS — R6521 Severe sepsis with septic shock: Secondary | ICD-10-CM | POA: Diagnosis not present

## 2018-11-20 DIAGNOSIS — R000 Tachycardia, unspecified: Secondary | ICD-10-CM | POA: Diagnosis not present

## 2018-11-20 DIAGNOSIS — J9601 Acute respiratory failure with hypoxia: Secondary | ICD-10-CM | POA: Diagnosis not present

## 2018-11-20 DIAGNOSIS — D6489 Other specified anemias: Secondary | ICD-10-CM | POA: Diagnosis not present

## 2018-11-20 DIAGNOSIS — R571 Hypovolemic shock: Secondary | ICD-10-CM | POA: Diagnosis not present

## 2018-11-20 DIAGNOSIS — D709 Neutropenia, unspecified: Secondary | ICD-10-CM | POA: Diagnosis not present

## 2018-11-20 DIAGNOSIS — R9431 Abnormal electrocardiogram [ECG] [EKG]: Secondary | ICD-10-CM | POA: Diagnosis not present

## 2018-11-20 DIAGNOSIS — D696 Thrombocytopenia, unspecified: Secondary | ICD-10-CM | POA: Diagnosis not present

## 2018-11-20 DIAGNOSIS — B961 Klebsiella pneumoniae [K. pneumoniae] as the cause of diseases classified elsewhere: Secondary | ICD-10-CM | POA: Diagnosis not present

## 2018-11-20 DIAGNOSIS — A0839 Other viral enteritis: Secondary | ICD-10-CM | POA: Diagnosis not present

## 2018-11-20 DIAGNOSIS — J9600 Acute respiratory failure, unspecified whether with hypoxia or hypercapnia: Secondary | ICD-10-CM | POA: Diagnosis not present

## 2018-11-20 MED ORDER — GENERIC EXTERNAL MEDICATION
0.03 | Status: DC
Start: ? — End: 2018-11-20

## 2018-11-20 MED ORDER — GENERIC EXTERNAL MEDICATION
Status: DC
Start: ? — End: 2018-11-20

## 2018-11-20 MED ORDER — VANCOMYCIN HCL IN NACL 750-0.9 MG/150ML-% IV SOLN
15.00 | INTRAVENOUS | Status: DC
Start: 2018-11-20 — End: 2018-11-20

## 2018-11-20 MED ORDER — GENERIC EXTERNAL MEDICATION
1.00 | Status: DC
Start: 2018-11-22 — End: 2018-11-20

## 2018-11-20 MED ORDER — Medication
Status: DC
Start: ? — End: 2018-11-20

## 2018-11-20 MED ORDER — GENERIC EXTERNAL MEDICATION
50.00 | Status: DC
Start: ? — End: 2018-11-20

## 2018-11-20 MED ORDER — CHLORHEXIDINE GLUCONATE 0.12 % MT SOLN
15.00 | OROMUCOSAL | Status: DC
Start: 2018-12-01 — End: 2018-11-20

## 2018-11-20 MED ORDER — CHLORHEXIDINE GLUCONATE 0.12 % MT SOLN
15.00 | OROMUCOSAL | Status: DC
Start: ? — End: 2018-11-20

## 2018-11-20 MED ORDER — SODIUM BICARBONATE 650 MG PO TABS
1300.00 | ORAL_TABLET | ORAL | Status: DC
Start: 2018-11-21 — End: 2018-11-20

## 2018-11-20 MED ORDER — NALOXONE HCL 0.4 MG/ML IJ SOLN [OVERRIDE RECORD] [AGE NEONATE]
0.00 | Status: DC
Start: ? — End: 2018-11-20

## 2018-11-20 MED ORDER — GENERIC EXTERNAL MEDICATION
2.50 | Status: DC
Start: 2018-11-20 — End: 2018-11-20

## 2018-11-20 MED ORDER — GENERIC EXTERNAL MEDICATION
1.25 | Status: DC
Start: 2018-11-22 — End: 2018-11-20

## 2018-11-21 DIAGNOSIS — B20 Human immunodeficiency virus [HIV] disease: Secondary | ICD-10-CM | POA: Diagnosis not present

## 2018-11-21 DIAGNOSIS — B961 Klebsiella pneumoniae [K. pneumoniae] as the cause of diseases classified elsewhere: Secondary | ICD-10-CM | POA: Diagnosis not present

## 2018-11-21 DIAGNOSIS — R6521 Severe sepsis with septic shock: Secondary | ICD-10-CM | POA: Diagnosis not present

## 2018-11-21 DIAGNOSIS — D761 Hemophagocytic lymphohistiocytosis: Secondary | ICD-10-CM | POA: Diagnosis not present

## 2018-11-21 DIAGNOSIS — D6489 Other specified anemias: Secondary | ICD-10-CM | POA: Diagnosis not present

## 2018-11-21 DIAGNOSIS — R000 Tachycardia, unspecified: Secondary | ICD-10-CM | POA: Diagnosis not present

## 2018-11-21 DIAGNOSIS — J189 Pneumonia, unspecified organism: Secondary | ICD-10-CM | POA: Diagnosis not present

## 2018-11-21 DIAGNOSIS — A0839 Other viral enteritis: Secondary | ICD-10-CM | POA: Diagnosis not present

## 2018-11-21 DIAGNOSIS — B259 Cytomegaloviral disease, unspecified: Secondary | ICD-10-CM | POA: Diagnosis not present

## 2018-11-21 DIAGNOSIS — A419 Sepsis, unspecified organism: Secondary | ICD-10-CM | POA: Diagnosis not present

## 2018-11-21 DIAGNOSIS — J9601 Acute respiratory failure with hypoxia: Secondary | ICD-10-CM | POA: Diagnosis not present

## 2018-11-21 DIAGNOSIS — R579 Shock, unspecified: Secondary | ICD-10-CM | POA: Diagnosis not present

## 2018-11-21 DIAGNOSIS — D696 Thrombocytopenia, unspecified: Secondary | ICD-10-CM | POA: Diagnosis not present

## 2018-11-21 DIAGNOSIS — N179 Acute kidney failure, unspecified: Secondary | ICD-10-CM | POA: Diagnosis not present

## 2018-11-21 DIAGNOSIS — D709 Neutropenia, unspecified: Secondary | ICD-10-CM | POA: Diagnosis not present

## 2018-11-21 DIAGNOSIS — R9431 Abnormal electrocardiogram [ECG] [EKG]: Secondary | ICD-10-CM | POA: Diagnosis not present

## 2018-11-21 DIAGNOSIS — R7881 Bacteremia: Secondary | ICD-10-CM | POA: Diagnosis not present

## 2018-11-21 DIAGNOSIS — R5081 Fever presenting with conditions classified elsewhere: Secondary | ICD-10-CM | POA: Diagnosis not present

## 2018-11-21 MED ORDER — FENTANYL CITRATE (PF) 50 MCG/ML IJ SOLN
25.00 | INTRAMUSCULAR | Status: DC
Start: ? — End: 2018-11-21

## 2018-11-21 MED ORDER — CALCIUM ACETATE (PHOS BINDER) 667 MG PO CAPS
1334.00 | ORAL_CAPSULE | ORAL | Status: DC
Start: 2018-11-21 — End: 2018-11-21

## 2018-11-21 MED ORDER — GENERIC EXTERNAL MEDICATION
Status: DC
Start: ? — End: 2018-11-21

## 2018-11-21 MED ORDER — SODIUM CHLORIDE 0.9 % IV SOLN
250.00 | INTRAVENOUS | Status: DC
Start: ? — End: 2018-11-21

## 2018-11-22 DIAGNOSIS — B961 Klebsiella pneumoniae [K. pneumoniae] as the cause of diseases classified elsewhere: Secondary | ICD-10-CM | POA: Diagnosis not present

## 2018-11-22 DIAGNOSIS — B20 Human immunodeficiency virus [HIV] disease: Secondary | ICD-10-CM | POA: Diagnosis not present

## 2018-11-22 DIAGNOSIS — Z9911 Dependence on respirator [ventilator] status: Secondary | ICD-10-CM | POA: Diagnosis not present

## 2018-11-22 DIAGNOSIS — B259 Cytomegaloviral disease, unspecified: Secondary | ICD-10-CM | POA: Diagnosis not present

## 2018-11-22 DIAGNOSIS — A0839 Other viral enteritis: Secondary | ICD-10-CM | POA: Diagnosis not present

## 2018-11-22 DIAGNOSIS — R5081 Fever presenting with conditions classified elsewhere: Secondary | ICD-10-CM | POA: Diagnosis not present

## 2018-11-22 DIAGNOSIS — R579 Shock, unspecified: Secondary | ICD-10-CM | POA: Diagnosis not present

## 2018-11-22 DIAGNOSIS — D696 Thrombocytopenia, unspecified: Secondary | ICD-10-CM | POA: Diagnosis not present

## 2018-11-22 DIAGNOSIS — J189 Pneumonia, unspecified organism: Secondary | ICD-10-CM | POA: Diagnosis not present

## 2018-11-22 DIAGNOSIS — A419 Sepsis, unspecified organism: Secondary | ICD-10-CM | POA: Diagnosis not present

## 2018-11-22 DIAGNOSIS — J90 Pleural effusion, not elsewhere classified: Secondary | ICD-10-CM | POA: Diagnosis not present

## 2018-11-22 DIAGNOSIS — R918 Other nonspecific abnormal finding of lung field: Secondary | ICD-10-CM | POA: Diagnosis not present

## 2018-11-22 DIAGNOSIS — R6521 Severe sepsis with septic shock: Secondary | ICD-10-CM | POA: Diagnosis not present

## 2018-11-22 DIAGNOSIS — R7881 Bacteremia: Secondary | ICD-10-CM | POA: Diagnosis not present

## 2018-11-22 DIAGNOSIS — E8770 Fluid overload, unspecified: Secondary | ICD-10-CM | POA: Diagnosis not present

## 2018-11-22 DIAGNOSIS — D709 Neutropenia, unspecified: Secondary | ICD-10-CM | POA: Diagnosis not present

## 2018-11-22 DIAGNOSIS — D761 Hemophagocytic lymphohistiocytosis: Secondary | ICD-10-CM | POA: Diagnosis not present

## 2018-11-22 DIAGNOSIS — R9431 Abnormal electrocardiogram [ECG] [EKG]: Secondary | ICD-10-CM | POA: Diagnosis not present

## 2018-11-22 DIAGNOSIS — D6489 Other specified anemias: Secondary | ICD-10-CM | POA: Diagnosis not present

## 2018-11-22 DIAGNOSIS — N179 Acute kidney failure, unspecified: Secondary | ICD-10-CM | POA: Diagnosis not present

## 2018-11-22 DIAGNOSIS — J9601 Acute respiratory failure with hypoxia: Secondary | ICD-10-CM | POA: Diagnosis not present

## 2018-11-22 DIAGNOSIS — Z452 Encounter for adjustment and management of vascular access device: Secondary | ICD-10-CM | POA: Diagnosis not present

## 2018-11-22 MED ORDER — FENTANYL CITRATE (PF) 50 MCG/ML IJ SOLN
25.00 | INTRAMUSCULAR | Status: DC
Start: ? — End: 2018-11-22

## 2018-11-22 MED ORDER — GLUCOSE 40 % PO GEL
15.00 | ORAL | Status: DC
Start: ? — End: 2018-11-22

## 2018-11-22 MED ORDER — GENERIC EXTERNAL MEDICATION
15.00 L | Status: DC
Start: ? — End: 2018-11-22

## 2018-11-22 MED ORDER — GENERIC EXTERNAL MEDICATION
Status: DC
Start: ? — End: 2018-11-22

## 2018-11-22 MED ORDER — DEXTROSE 10 % IV SOLN
125.00 | INTRAVENOUS | Status: DC
Start: ? — End: 2018-11-22

## 2018-11-22 MED ORDER — INSULIN LISPRO 100 UNIT/ML ~~LOC~~ SOLN
1.00 | SUBCUTANEOUS | Status: DC
Start: 2018-12-24 — End: 2018-11-22

## 2018-11-22 MED ORDER — LORAZEPAM 0.5 MG PO TABS
0.50 | ORAL_TABLET | ORAL | Status: DC
Start: ? — End: 2018-11-22

## 2018-11-22 MED ORDER — NUTRISOURCE FIBER PO PACK
1.00 | PACK | ORAL | Status: DC
Start: 2018-11-27 — End: 2018-11-22

## 2018-11-22 MED ORDER — SEVELAMER CARBONATE 0.8 G PO PACK
1600.00 | PACK | ORAL | Status: DC
Start: 2018-11-23 — End: 2018-11-22

## 2018-11-23 DIAGNOSIS — D696 Thrombocytopenia, unspecified: Secondary | ICD-10-CM | POA: Diagnosis not present

## 2018-11-23 DIAGNOSIS — Z9911 Dependence on respirator [ventilator] status: Secondary | ICD-10-CM | POA: Diagnosis not present

## 2018-11-23 DIAGNOSIS — J9601 Acute respiratory failure with hypoxia: Secondary | ICD-10-CM | POA: Diagnosis not present

## 2018-11-23 DIAGNOSIS — B20 Human immunodeficiency virus [HIV] disease: Secondary | ICD-10-CM | POA: Diagnosis not present

## 2018-11-23 DIAGNOSIS — D709 Neutropenia, unspecified: Secondary | ICD-10-CM | POA: Diagnosis not present

## 2018-11-23 DIAGNOSIS — R5081 Fever presenting with conditions classified elsewhere: Secondary | ICD-10-CM | POA: Diagnosis not present

## 2018-11-23 DIAGNOSIS — A0839 Other viral enteritis: Secondary | ICD-10-CM | POA: Diagnosis not present

## 2018-11-23 DIAGNOSIS — B259 Cytomegaloviral disease, unspecified: Secondary | ICD-10-CM | POA: Diagnosis not present

## 2018-11-23 DIAGNOSIS — R7881 Bacteremia: Secondary | ICD-10-CM | POA: Diagnosis not present

## 2018-11-23 DIAGNOSIS — D6489 Other specified anemias: Secondary | ICD-10-CM | POA: Diagnosis not present

## 2018-11-23 DIAGNOSIS — D761 Hemophagocytic lymphohistiocytosis: Secondary | ICD-10-CM | POA: Diagnosis not present

## 2018-11-23 DIAGNOSIS — N179 Acute kidney failure, unspecified: Secondary | ICD-10-CM | POA: Diagnosis not present

## 2018-11-23 DIAGNOSIS — B961 Klebsiella pneumoniae [K. pneumoniae] as the cause of diseases classified elsewhere: Secondary | ICD-10-CM | POA: Diagnosis not present

## 2018-11-24 DIAGNOSIS — J90 Pleural effusion, not elsewhere classified: Secondary | ICD-10-CM | POA: Diagnosis not present

## 2018-11-24 DIAGNOSIS — J9601 Acute respiratory failure with hypoxia: Secondary | ICD-10-CM | POA: Diagnosis not present

## 2018-11-24 DIAGNOSIS — J158 Pneumonia due to other specified bacteria: Secondary | ICD-10-CM | POA: Diagnosis not present

## 2018-11-24 DIAGNOSIS — N179 Acute kidney failure, unspecified: Secondary | ICD-10-CM | POA: Diagnosis not present

## 2018-11-24 DIAGNOSIS — N049 Nephrotic syndrome with unspecified morphologic changes: Secondary | ICD-10-CM | POA: Diagnosis not present

## 2018-11-24 DIAGNOSIS — D61818 Other pancytopenia: Secondary | ICD-10-CM | POA: Diagnosis not present

## 2018-11-24 DIAGNOSIS — D649 Anemia, unspecified: Secondary | ICD-10-CM | POA: Diagnosis not present

## 2018-11-24 DIAGNOSIS — B20 Human immunodeficiency virus [HIV] disease: Secondary | ICD-10-CM | POA: Diagnosis not present

## 2018-11-24 DIAGNOSIS — E8809 Other disorders of plasma-protein metabolism, not elsewhere classified: Secondary | ICD-10-CM | POA: Diagnosis not present

## 2018-11-24 DIAGNOSIS — R918 Other nonspecific abnormal finding of lung field: Secondary | ICD-10-CM | POA: Diagnosis not present

## 2018-11-24 DIAGNOSIS — D6489 Other specified anemias: Secondary | ICD-10-CM | POA: Diagnosis not present

## 2018-11-24 DIAGNOSIS — D696 Thrombocytopenia, unspecified: Secondary | ICD-10-CM | POA: Diagnosis not present

## 2018-11-24 DIAGNOSIS — Z452 Encounter for adjustment and management of vascular access device: Secondary | ICD-10-CM | POA: Diagnosis not present

## 2018-11-24 DIAGNOSIS — D761 Hemophagocytic lymphohistiocytosis: Secondary | ICD-10-CM | POA: Diagnosis not present

## 2018-11-25 DIAGNOSIS — B259 Cytomegaloviral disease, unspecified: Secondary | ICD-10-CM | POA: Diagnosis not present

## 2018-11-25 DIAGNOSIS — N179 Acute kidney failure, unspecified: Secondary | ICD-10-CM | POA: Diagnosis not present

## 2018-11-25 DIAGNOSIS — I493 Ventricular premature depolarization: Secondary | ICD-10-CM | POA: Diagnosis not present

## 2018-11-25 DIAGNOSIS — R093 Abnormal sputum: Secondary | ICD-10-CM | POA: Diagnosis not present

## 2018-11-25 DIAGNOSIS — R7881 Bacteremia: Secondary | ICD-10-CM | POA: Diagnosis not present

## 2018-11-25 DIAGNOSIS — B961 Klebsiella pneumoniae [K. pneumoniae] as the cause of diseases classified elsewhere: Secondary | ICD-10-CM | POA: Diagnosis not present

## 2018-11-25 DIAGNOSIS — R6521 Severe sepsis with septic shock: Secondary | ICD-10-CM | POA: Diagnosis not present

## 2018-11-25 DIAGNOSIS — A0839 Other viral enteritis: Secondary | ICD-10-CM | POA: Diagnosis not present

## 2018-11-25 DIAGNOSIS — R579 Shock, unspecified: Secondary | ICD-10-CM | POA: Diagnosis not present

## 2018-11-25 DIAGNOSIS — D649 Anemia, unspecified: Secondary | ICD-10-CM | POA: Diagnosis not present

## 2018-11-25 DIAGNOSIS — J9601 Acute respiratory failure with hypoxia: Secondary | ICD-10-CM | POA: Diagnosis not present

## 2018-11-25 DIAGNOSIS — J9600 Acute respiratory failure, unspecified whether with hypoxia or hypercapnia: Secondary | ICD-10-CM | POA: Diagnosis not present

## 2018-11-25 DIAGNOSIS — D761 Hemophagocytic lymphohistiocytosis: Secondary | ICD-10-CM | POA: Diagnosis not present

## 2018-11-25 DIAGNOSIS — R791 Abnormal coagulation profile: Secondary | ICD-10-CM | POA: Diagnosis not present

## 2018-11-25 DIAGNOSIS — A419 Sepsis, unspecified organism: Secondary | ICD-10-CM | POA: Diagnosis not present

## 2018-11-25 DIAGNOSIS — Z9911 Dependence on respirator [ventilator] status: Secondary | ICD-10-CM | POA: Diagnosis not present

## 2018-11-25 DIAGNOSIS — D696 Thrombocytopenia, unspecified: Secondary | ICD-10-CM | POA: Diagnosis not present

## 2018-11-25 DIAGNOSIS — B20 Human immunodeficiency virus [HIV] disease: Secondary | ICD-10-CM | POA: Diagnosis not present

## 2018-11-26 DIAGNOSIS — J159 Unspecified bacterial pneumonia: Secondary | ICD-10-CM | POA: Diagnosis not present

## 2018-11-26 DIAGNOSIS — A419 Sepsis, unspecified organism: Secondary | ICD-10-CM | POA: Diagnosis not present

## 2018-11-26 DIAGNOSIS — R6521 Severe sepsis with septic shock: Secondary | ICD-10-CM | POA: Diagnosis not present

## 2018-11-26 DIAGNOSIS — B259 Cytomegaloviral disease, unspecified: Secondary | ICD-10-CM | POA: Diagnosis not present

## 2018-11-26 DIAGNOSIS — B961 Klebsiella pneumoniae [K. pneumoniae] as the cause of diseases classified elsewhere: Secondary | ICD-10-CM | POA: Diagnosis not present

## 2018-11-26 DIAGNOSIS — D696 Thrombocytopenia, unspecified: Secondary | ICD-10-CM | POA: Diagnosis not present

## 2018-11-26 DIAGNOSIS — D649 Anemia, unspecified: Secondary | ICD-10-CM | POA: Diagnosis not present

## 2018-11-26 DIAGNOSIS — I493 Ventricular premature depolarization: Secondary | ICD-10-CM | POA: Diagnosis not present

## 2018-11-26 DIAGNOSIS — D761 Hemophagocytic lymphohistiocytosis: Secondary | ICD-10-CM | POA: Diagnosis not present

## 2018-11-26 DIAGNOSIS — B20 Human immunodeficiency virus [HIV] disease: Secondary | ICD-10-CM | POA: Diagnosis not present

## 2018-11-26 DIAGNOSIS — N179 Acute kidney failure, unspecified: Secondary | ICD-10-CM | POA: Diagnosis not present

## 2018-11-26 DIAGNOSIS — R7881 Bacteremia: Secondary | ICD-10-CM | POA: Diagnosis not present

## 2018-11-26 DIAGNOSIS — J9600 Acute respiratory failure, unspecified whether with hypoxia or hypercapnia: Secondary | ICD-10-CM | POA: Diagnosis not present

## 2018-11-26 DIAGNOSIS — A0839 Other viral enteritis: Secondary | ICD-10-CM | POA: Diagnosis not present

## 2018-11-26 DIAGNOSIS — Z9911 Dependence on respirator [ventilator] status: Secondary | ICD-10-CM | POA: Diagnosis not present

## 2018-11-26 DIAGNOSIS — J9601 Acute respiratory failure with hypoxia: Secondary | ICD-10-CM | POA: Diagnosis not present

## 2018-11-26 DIAGNOSIS — R791 Abnormal coagulation profile: Secondary | ICD-10-CM | POA: Diagnosis not present

## 2018-11-27 DIAGNOSIS — B20 Human immunodeficiency virus [HIV] disease: Secondary | ICD-10-CM | POA: Diagnosis not present

## 2018-11-27 DIAGNOSIS — B961 Klebsiella pneumoniae [K. pneumoniae] as the cause of diseases classified elsewhere: Secondary | ICD-10-CM | POA: Diagnosis not present

## 2018-11-27 DIAGNOSIS — D649 Anemia, unspecified: Secondary | ICD-10-CM | POA: Diagnosis not present

## 2018-11-27 DIAGNOSIS — J9601 Acute respiratory failure with hypoxia: Secondary | ICD-10-CM | POA: Diagnosis not present

## 2018-11-27 DIAGNOSIS — D761 Hemophagocytic lymphohistiocytosis: Secondary | ICD-10-CM | POA: Diagnosis not present

## 2018-11-27 DIAGNOSIS — R7881 Bacteremia: Secondary | ICD-10-CM | POA: Diagnosis not present

## 2018-11-27 DIAGNOSIS — A419 Sepsis, unspecified organism: Secondary | ICD-10-CM | POA: Diagnosis not present

## 2018-11-27 DIAGNOSIS — R6521 Severe sepsis with septic shock: Secondary | ICD-10-CM | POA: Diagnosis not present

## 2018-11-27 DIAGNOSIS — N179 Acute kidney failure, unspecified: Secondary | ICD-10-CM | POA: Diagnosis not present

## 2018-11-27 DIAGNOSIS — R791 Abnormal coagulation profile: Secondary | ICD-10-CM | POA: Diagnosis not present

## 2018-11-27 DIAGNOSIS — J9600 Acute respiratory failure, unspecified whether with hypoxia or hypercapnia: Secondary | ICD-10-CM | POA: Diagnosis not present

## 2018-11-27 DIAGNOSIS — D696 Thrombocytopenia, unspecified: Secondary | ICD-10-CM | POA: Diagnosis not present

## 2018-11-27 MED ORDER — DOLUTEGRAVIR SODIUM 50 MG PO TABS
50.00 | ORAL_TABLET | ORAL | Status: DC
Start: 2018-12-13 — End: 2018-11-27

## 2018-11-27 MED ORDER — GENERIC EXTERNAL MEDICATION
Status: DC
Start: ? — End: 2018-11-27

## 2018-11-27 MED ORDER — GENERIC EXTERNAL MEDICATION
0.63 | Status: DC
Start: 2018-11-27 — End: 2018-11-27

## 2018-11-27 MED ORDER — SULFAMETHOXAZOLE-TRIMETHOPRIM 800-160 MG PO TABS
1.00 | ORAL_TABLET | ORAL | Status: DC
Start: 2018-12-16 — End: 2018-11-27

## 2018-11-27 MED ORDER — LAMIVUDINE 150 MG PO TABS
150.00 | ORAL_TABLET | ORAL | Status: DC
Start: 2018-12-11 — End: 2018-11-27

## 2018-11-27 MED ORDER — LOPERAMIDE HCL 1 MG/7.5ML PO LIQD
2.00 | ORAL | Status: DC
Start: ? — End: 2018-11-27

## 2018-11-27 MED ORDER — GENERIC EXTERNAL MEDICATION
80.00 | Status: DC
Start: ? — End: 2018-11-27

## 2018-11-27 MED ORDER — FLUCONAZOLE 50 MG PO TABS
100.00 | ORAL_TABLET | ORAL | Status: DC
Start: 2018-11-28 — End: 2018-11-27

## 2018-11-27 MED ORDER — GENERIC EXTERNAL MEDICATION
1.00 | Status: DC
Start: 2018-11-27 — End: 2018-11-27

## 2018-11-28 DIAGNOSIS — B20 Human immunodeficiency virus [HIV] disease: Secondary | ICD-10-CM | POA: Diagnosis not present

## 2018-11-28 DIAGNOSIS — B961 Klebsiella pneumoniae [K. pneumoniae] as the cause of diseases classified elsewhere: Secondary | ICD-10-CM | POA: Diagnosis not present

## 2018-11-28 DIAGNOSIS — D696 Thrombocytopenia, unspecified: Secondary | ICD-10-CM | POA: Diagnosis not present

## 2018-11-28 DIAGNOSIS — A419 Sepsis, unspecified organism: Secondary | ICD-10-CM | POA: Diagnosis not present

## 2018-11-28 DIAGNOSIS — R7881 Bacteremia: Secondary | ICD-10-CM | POA: Diagnosis not present

## 2018-11-28 DIAGNOSIS — D761 Hemophagocytic lymphohistiocytosis: Secondary | ICD-10-CM | POA: Diagnosis not present

## 2018-11-28 DIAGNOSIS — R140 Abdominal distension (gaseous): Secondary | ICD-10-CM | POA: Diagnosis not present

## 2018-11-28 DIAGNOSIS — N179 Acute kidney failure, unspecified: Secondary | ICD-10-CM | POA: Diagnosis not present

## 2018-11-28 DIAGNOSIS — J9601 Acute respiratory failure with hypoxia: Secondary | ICD-10-CM | POA: Diagnosis not present

## 2018-11-28 DIAGNOSIS — R6521 Severe sepsis with septic shock: Secondary | ICD-10-CM | POA: Diagnosis not present

## 2018-11-29 DIAGNOSIS — J9601 Acute respiratory failure with hypoxia: Secondary | ICD-10-CM | POA: Diagnosis not present

## 2018-11-29 DIAGNOSIS — D761 Hemophagocytic lymphohistiocytosis: Secondary | ICD-10-CM | POA: Diagnosis not present

## 2018-11-29 DIAGNOSIS — D696 Thrombocytopenia, unspecified: Secondary | ICD-10-CM | POA: Diagnosis not present

## 2018-11-29 DIAGNOSIS — A419 Sepsis, unspecified organism: Secondary | ICD-10-CM | POA: Diagnosis not present

## 2018-11-29 DIAGNOSIS — B961 Klebsiella pneumoniae [K. pneumoniae] as the cause of diseases classified elsewhere: Secondary | ICD-10-CM | POA: Diagnosis not present

## 2018-11-29 DIAGNOSIS — R7881 Bacteremia: Secondary | ICD-10-CM | POA: Diagnosis not present

## 2018-11-29 DIAGNOSIS — B20 Human immunodeficiency virus [HIV] disease: Secondary | ICD-10-CM | POA: Diagnosis not present

## 2018-11-29 DIAGNOSIS — R6521 Severe sepsis with septic shock: Secondary | ICD-10-CM | POA: Diagnosis not present

## 2018-11-29 DIAGNOSIS — N179 Acute kidney failure, unspecified: Secondary | ICD-10-CM | POA: Diagnosis not present

## 2018-11-30 DIAGNOSIS — D696 Thrombocytopenia, unspecified: Secondary | ICD-10-CM | POA: Diagnosis not present

## 2018-11-30 DIAGNOSIS — J9601 Acute respiratory failure with hypoxia: Secondary | ICD-10-CM | POA: Diagnosis not present

## 2018-11-30 DIAGNOSIS — A419 Sepsis, unspecified organism: Secondary | ICD-10-CM | POA: Diagnosis not present

## 2018-11-30 DIAGNOSIS — B20 Human immunodeficiency virus [HIV] disease: Secondary | ICD-10-CM | POA: Diagnosis not present

## 2018-11-30 DIAGNOSIS — D761 Hemophagocytic lymphohistiocytosis: Secondary | ICD-10-CM | POA: Diagnosis not present

## 2018-11-30 DIAGNOSIS — B961 Klebsiella pneumoniae [K. pneumoniae] as the cause of diseases classified elsewhere: Secondary | ICD-10-CM | POA: Diagnosis not present

## 2018-11-30 DIAGNOSIS — R141 Gas pain: Secondary | ICD-10-CM | POA: Diagnosis not present

## 2018-11-30 DIAGNOSIS — N179 Acute kidney failure, unspecified: Secondary | ICD-10-CM | POA: Diagnosis not present

## 2018-11-30 DIAGNOSIS — R7881 Bacteremia: Secondary | ICD-10-CM | POA: Diagnosis not present

## 2018-11-30 DIAGNOSIS — R6521 Severe sepsis with septic shock: Secondary | ICD-10-CM | POA: Diagnosis not present

## 2018-11-30 DIAGNOSIS — B379 Candidiasis, unspecified: Secondary | ICD-10-CM | POA: Diagnosis not present

## 2018-11-30 DIAGNOSIS — B259 Cytomegaloviral disease, unspecified: Secondary | ICD-10-CM | POA: Diagnosis not present

## 2018-11-30 DIAGNOSIS — A3781 Whooping cough due to other Bordetella species with pneumonia: Secondary | ICD-10-CM | POA: Diagnosis not present

## 2018-12-01 DIAGNOSIS — B961 Klebsiella pneumoniae [K. pneumoniae] as the cause of diseases classified elsewhere: Secondary | ICD-10-CM | POA: Diagnosis not present

## 2018-12-01 DIAGNOSIS — A3781 Whooping cough due to other Bordetella species with pneumonia: Secondary | ICD-10-CM | POA: Diagnosis not present

## 2018-12-01 DIAGNOSIS — D696 Thrombocytopenia, unspecified: Secondary | ICD-10-CM | POA: Diagnosis not present

## 2018-12-01 DIAGNOSIS — J9601 Acute respiratory failure with hypoxia: Secondary | ICD-10-CM | POA: Diagnosis not present

## 2018-12-01 DIAGNOSIS — N049 Nephrotic syndrome with unspecified morphologic changes: Secondary | ICD-10-CM | POA: Diagnosis not present

## 2018-12-01 DIAGNOSIS — I5031 Acute diastolic (congestive) heart failure: Secondary | ICD-10-CM | POA: Diagnosis not present

## 2018-12-01 DIAGNOSIS — D761 Hemophagocytic lymphohistiocytosis: Secondary | ICD-10-CM | POA: Diagnosis not present

## 2018-12-01 DIAGNOSIS — B20 Human immunodeficiency virus [HIV] disease: Secondary | ICD-10-CM | POA: Diagnosis not present

## 2018-12-01 DIAGNOSIS — D61818 Other pancytopenia: Secondary | ICD-10-CM | POA: Diagnosis not present

## 2018-12-01 DIAGNOSIS — D649 Anemia, unspecified: Secondary | ICD-10-CM | POA: Diagnosis not present

## 2018-12-01 DIAGNOSIS — Z4682 Encounter for fitting and adjustment of non-vascular catheter: Secondary | ICD-10-CM | POA: Diagnosis not present

## 2018-12-01 DIAGNOSIS — B379 Candidiasis, unspecified: Secondary | ICD-10-CM | POA: Diagnosis not present

## 2018-12-01 DIAGNOSIS — A3711 Whooping cough due to Bordetella parapertussis with pneumonia: Secondary | ICD-10-CM | POA: Diagnosis not present

## 2018-12-01 DIAGNOSIS — E8809 Other disorders of plasma-protein metabolism, not elsewhere classified: Secondary | ICD-10-CM | POA: Diagnosis not present

## 2018-12-01 DIAGNOSIS — R7881 Bacteremia: Secondary | ICD-10-CM | POA: Diagnosis not present

## 2018-12-01 DIAGNOSIS — B259 Cytomegaloviral disease, unspecified: Secondary | ICD-10-CM | POA: Diagnosis not present

## 2018-12-01 DIAGNOSIS — N179 Acute kidney failure, unspecified: Secondary | ICD-10-CM | POA: Diagnosis not present

## 2018-12-01 MED ORDER — GENERIC EXTERNAL MEDICATION
0.63 | Status: DC
Start: 2018-12-04 — End: 2018-12-01

## 2018-12-01 MED ORDER — GENERIC EXTERNAL MEDICATION
100.00 | Status: DC
Start: 2018-12-11 — End: 2018-12-01

## 2018-12-01 MED ORDER — OXYCODONE HCL 5 MG PO TABS
5.00 | ORAL_TABLET | ORAL | Status: DC
Start: ? — End: 2018-12-01

## 2018-12-01 MED ORDER — LIDOCAINE HCL URETHRAL/MUCOSAL 2 % EX GEL
CUTANEOUS | Status: DC
Start: ? — End: 2018-12-01

## 2018-12-01 MED ORDER — GENERIC EXTERNAL MEDICATION
1.00 | Status: DC
Start: 2018-12-02 — End: 2018-12-01

## 2018-12-02 ENCOUNTER — Ambulatory Visit: Payer: 59

## 2018-12-02 DIAGNOSIS — R791 Abnormal coagulation profile: Secondary | ICD-10-CM | POA: Diagnosis not present

## 2018-12-02 DIAGNOSIS — N179 Acute kidney failure, unspecified: Secondary | ICD-10-CM | POA: Diagnosis not present

## 2018-12-02 DIAGNOSIS — R109 Unspecified abdominal pain: Secondary | ICD-10-CM | POA: Diagnosis not present

## 2018-12-02 DIAGNOSIS — A3781 Whooping cough due to other Bordetella species with pneumonia: Secondary | ICD-10-CM | POA: Diagnosis not present

## 2018-12-02 DIAGNOSIS — K567 Ileus, unspecified: Secondary | ICD-10-CM | POA: Diagnosis not present

## 2018-12-02 DIAGNOSIS — D696 Thrombocytopenia, unspecified: Secondary | ICD-10-CM | POA: Diagnosis not present

## 2018-12-02 DIAGNOSIS — J9601 Acute respiratory failure with hypoxia: Secondary | ICD-10-CM | POA: Diagnosis not present

## 2018-12-02 DIAGNOSIS — B3789 Other sites of candidiasis: Secondary | ICD-10-CM | POA: Diagnosis not present

## 2018-12-02 DIAGNOSIS — R7881 Bacteremia: Secondary | ICD-10-CM | POA: Diagnosis not present

## 2018-12-02 DIAGNOSIS — B20 Human immunodeficiency virus [HIV] disease: Secondary | ICD-10-CM | POA: Diagnosis not present

## 2018-12-02 DIAGNOSIS — D761 Hemophagocytic lymphohistiocytosis: Secondary | ICD-10-CM | POA: Diagnosis not present

## 2018-12-02 DIAGNOSIS — B961 Klebsiella pneumoniae [K. pneumoniae] as the cause of diseases classified elsewhere: Secondary | ICD-10-CM | POA: Diagnosis not present

## 2018-12-02 DIAGNOSIS — B259 Cytomegaloviral disease, unspecified: Secondary | ICD-10-CM | POA: Diagnosis not present

## 2018-12-02 DIAGNOSIS — R579 Shock, unspecified: Secondary | ICD-10-CM | POA: Diagnosis not present

## 2018-12-02 DIAGNOSIS — B258 Other cytomegaloviral diseases: Secondary | ICD-10-CM | POA: Diagnosis not present

## 2018-12-03 DIAGNOSIS — B259 Cytomegaloviral disease, unspecified: Secondary | ICD-10-CM | POA: Diagnosis not present

## 2018-12-03 DIAGNOSIS — J9601 Acute respiratory failure with hypoxia: Secondary | ICD-10-CM | POA: Diagnosis not present

## 2018-12-03 DIAGNOSIS — B961 Klebsiella pneumoniae [K. pneumoniae] as the cause of diseases classified elsewhere: Secondary | ICD-10-CM | POA: Diagnosis not present

## 2018-12-03 DIAGNOSIS — A3781 Whooping cough due to other Bordetella species with pneumonia: Secondary | ICD-10-CM | POA: Diagnosis not present

## 2018-12-03 DIAGNOSIS — B20 Human immunodeficiency virus [HIV] disease: Secondary | ICD-10-CM | POA: Diagnosis not present

## 2018-12-03 DIAGNOSIS — B258 Other cytomegaloviral diseases: Secondary | ICD-10-CM | POA: Diagnosis not present

## 2018-12-03 DIAGNOSIS — B3789 Other sites of candidiasis: Secondary | ICD-10-CM | POA: Diagnosis not present

## 2018-12-03 DIAGNOSIS — D696 Thrombocytopenia, unspecified: Secondary | ICD-10-CM | POA: Diagnosis not present

## 2018-12-03 DIAGNOSIS — N179 Acute kidney failure, unspecified: Secondary | ICD-10-CM | POA: Diagnosis not present

## 2018-12-03 DIAGNOSIS — R579 Shock, unspecified: Secondary | ICD-10-CM | POA: Diagnosis not present

## 2018-12-03 DIAGNOSIS — R791 Abnormal coagulation profile: Secondary | ICD-10-CM | POA: Diagnosis not present

## 2018-12-03 DIAGNOSIS — R7881 Bacteremia: Secondary | ICD-10-CM | POA: Diagnosis not present

## 2018-12-03 DIAGNOSIS — D761 Hemophagocytic lymphohistiocytosis: Secondary | ICD-10-CM | POA: Diagnosis not present

## 2018-12-04 DIAGNOSIS — B3789 Other sites of candidiasis: Secondary | ICD-10-CM | POA: Diagnosis not present

## 2018-12-04 DIAGNOSIS — B258 Other cytomegaloviral diseases: Secondary | ICD-10-CM | POA: Diagnosis not present

## 2018-12-04 DIAGNOSIS — A3781 Whooping cough due to other Bordetella species with pneumonia: Secondary | ICD-10-CM | POA: Diagnosis not present

## 2018-12-04 DIAGNOSIS — R1312 Dysphagia, oropharyngeal phase: Secondary | ICD-10-CM | POA: Diagnosis not present

## 2018-12-04 DIAGNOSIS — N179 Acute kidney failure, unspecified: Secondary | ICD-10-CM | POA: Diagnosis not present

## 2018-12-04 DIAGNOSIS — D696 Thrombocytopenia, unspecified: Secondary | ICD-10-CM | POA: Diagnosis not present

## 2018-12-04 DIAGNOSIS — R633 Feeding difficulties: Secondary | ICD-10-CM | POA: Diagnosis not present

## 2018-12-04 DIAGNOSIS — B20 Human immunodeficiency virus [HIV] disease: Secondary | ICD-10-CM | POA: Diagnosis not present

## 2018-12-04 DIAGNOSIS — B961 Klebsiella pneumoniae [K. pneumoniae] as the cause of diseases classified elsewhere: Secondary | ICD-10-CM | POA: Diagnosis not present

## 2018-12-04 DIAGNOSIS — E8770 Fluid overload, unspecified: Secondary | ICD-10-CM | POA: Diagnosis not present

## 2018-12-04 DIAGNOSIS — J90 Pleural effusion, not elsewhere classified: Secondary | ICD-10-CM | POA: Diagnosis not present

## 2018-12-04 DIAGNOSIS — D761 Hemophagocytic lymphohistiocytosis: Secondary | ICD-10-CM | POA: Diagnosis not present

## 2018-12-04 DIAGNOSIS — J9601 Acute respiratory failure with hypoxia: Secondary | ICD-10-CM | POA: Diagnosis not present

## 2018-12-04 MED ORDER — GENERIC EXTERNAL MEDICATION
Status: DC
Start: ? — End: 2018-12-04

## 2018-12-04 MED ORDER — GENERIC EXTERNAL MEDICATION
Status: DC
Start: 2018-12-04 — End: 2018-12-04

## 2018-12-04 MED ORDER — LOPERAMIDE HCL 1 MG/7.5ML PO LIQD
2.00 | ORAL | Status: DC
Start: ? — End: 2018-12-04

## 2018-12-04 MED ORDER — PSYLLIUM 51.7 % PO PACK
1.00 | PACK | ORAL | Status: DC
Start: 2019-01-12 — End: 2018-12-04

## 2018-12-04 MED ORDER — DEXAMETHASONE 4 MG PO TABS
4.00 | ORAL_TABLET | ORAL | Status: DC
Start: 2018-12-13 — End: 2018-12-04

## 2018-12-04 MED ORDER — GENERIC EXTERNAL MEDICATION
5.00 | Status: DC
Start: 2018-12-05 — End: 2018-12-04

## 2018-12-04 MED ORDER — ACETAMINOPHEN 500 MG PO TABS
1000.00 | ORAL_TABLET | ORAL | Status: DC
Start: ? — End: 2018-12-04

## 2018-12-04 MED ORDER — GENERIC EXTERNAL MEDICATION
40.00 | Status: DC
Start: 2019-01-12 — End: 2018-12-04

## 2018-12-05 DIAGNOSIS — B20 Human immunodeficiency virus [HIV] disease: Secondary | ICD-10-CM | POA: Diagnosis not present

## 2018-12-05 DIAGNOSIS — D649 Anemia, unspecified: Secondary | ICD-10-CM | POA: Diagnosis not present

## 2018-12-05 DIAGNOSIS — D761 Hemophagocytic lymphohistiocytosis: Secondary | ICD-10-CM | POA: Diagnosis not present

## 2018-12-05 DIAGNOSIS — J9601 Acute respiratory failure with hypoxia: Secondary | ICD-10-CM | POA: Diagnosis not present

## 2018-12-05 DIAGNOSIS — N189 Chronic kidney disease, unspecified: Secondary | ICD-10-CM | POA: Diagnosis not present

## 2018-12-05 DIAGNOSIS — N179 Acute kidney failure, unspecified: Secondary | ICD-10-CM | POA: Diagnosis not present

## 2018-12-05 DIAGNOSIS — B259 Cytomegaloviral disease, unspecified: Secondary | ICD-10-CM | POA: Diagnosis not present

## 2018-12-05 DIAGNOSIS — D696 Thrombocytopenia, unspecified: Secondary | ICD-10-CM | POA: Diagnosis not present

## 2018-12-05 DIAGNOSIS — J9600 Acute respiratory failure, unspecified whether with hypoxia or hypercapnia: Secondary | ICD-10-CM | POA: Diagnosis not present

## 2018-12-05 DIAGNOSIS — A0839 Other viral enteritis: Secondary | ICD-10-CM | POA: Diagnosis not present

## 2018-12-06 DIAGNOSIS — E8770 Fluid overload, unspecified: Secondary | ICD-10-CM | POA: Diagnosis not present

## 2018-12-06 DIAGNOSIS — D696 Thrombocytopenia, unspecified: Secondary | ICD-10-CM | POA: Diagnosis not present

## 2018-12-06 DIAGNOSIS — E8809 Other disorders of plasma-protein metabolism, not elsewhere classified: Secondary | ICD-10-CM | POA: Diagnosis not present

## 2018-12-06 DIAGNOSIS — B20 Human immunodeficiency virus [HIV] disease: Secondary | ICD-10-CM | POA: Diagnosis not present

## 2018-12-06 DIAGNOSIS — N179 Acute kidney failure, unspecified: Secondary | ICD-10-CM | POA: Diagnosis not present

## 2018-12-06 DIAGNOSIS — D761 Hemophagocytic lymphohistiocytosis: Secondary | ICD-10-CM | POA: Diagnosis not present

## 2018-12-06 DIAGNOSIS — J9601 Acute respiratory failure with hypoxia: Secondary | ICD-10-CM | POA: Diagnosis not present

## 2018-12-06 MED ORDER — SODIUM CITRATE 4 % VI SOSY
5.00 | PREFILLED_SYRINGE | Status: DC
Start: ? — End: 2018-12-06

## 2018-12-06 MED ORDER — GENERIC EXTERNAL MEDICATION
1.00 | Status: DC
Start: 2019-01-11 — End: 2018-12-06

## 2018-12-06 MED ORDER — LOPERAMIDE HCL 2 MG PO CAPS
2.00 | ORAL_CAPSULE | ORAL | Status: DC
Start: ? — End: 2018-12-06

## 2018-12-07 DIAGNOSIS — E8809 Other disorders of plasma-protein metabolism, not elsewhere classified: Secondary | ICD-10-CM | POA: Diagnosis not present

## 2018-12-07 DIAGNOSIS — D696 Thrombocytopenia, unspecified: Secondary | ICD-10-CM | POA: Diagnosis not present

## 2018-12-07 DIAGNOSIS — B20 Human immunodeficiency virus [HIV] disease: Secondary | ICD-10-CM | POA: Diagnosis not present

## 2018-12-07 DIAGNOSIS — N179 Acute kidney failure, unspecified: Secondary | ICD-10-CM | POA: Diagnosis not present

## 2018-12-07 DIAGNOSIS — D761 Hemophagocytic lymphohistiocytosis: Secondary | ICD-10-CM | POA: Diagnosis not present

## 2018-12-07 DIAGNOSIS — B259 Cytomegaloviral disease, unspecified: Secondary | ICD-10-CM | POA: Diagnosis not present

## 2018-12-07 DIAGNOSIS — D638 Anemia in other chronic diseases classified elsewhere: Secondary | ICD-10-CM | POA: Diagnosis not present

## 2018-12-08 DIAGNOSIS — N179 Acute kidney failure, unspecified: Secondary | ICD-10-CM | POA: Diagnosis not present

## 2018-12-08 DIAGNOSIS — D638 Anemia in other chronic diseases classified elsewhere: Secondary | ICD-10-CM | POA: Diagnosis not present

## 2018-12-08 DIAGNOSIS — D696 Thrombocytopenia, unspecified: Secondary | ICD-10-CM | POA: Diagnosis not present

## 2018-12-08 DIAGNOSIS — B259 Cytomegaloviral disease, unspecified: Secondary | ICD-10-CM | POA: Diagnosis not present

## 2018-12-08 DIAGNOSIS — E8809 Other disorders of plasma-protein metabolism, not elsewhere classified: Secondary | ICD-10-CM | POA: Diagnosis not present

## 2018-12-08 DIAGNOSIS — B20 Human immunodeficiency virus [HIV] disease: Secondary | ICD-10-CM | POA: Diagnosis not present

## 2018-12-08 DIAGNOSIS — D61810 Antineoplastic chemotherapy induced pancytopenia: Secondary | ICD-10-CM | POA: Diagnosis not present

## 2018-12-08 DIAGNOSIS — D761 Hemophagocytic lymphohistiocytosis: Secondary | ICD-10-CM | POA: Diagnosis not present

## 2018-12-09 DIAGNOSIS — B20 Human immunodeficiency virus [HIV] disease: Secondary | ICD-10-CM | POA: Diagnosis not present

## 2018-12-09 DIAGNOSIS — B377 Candidal sepsis: Secondary | ICD-10-CM | POA: Diagnosis not present

## 2018-12-09 DIAGNOSIS — J9601 Acute respiratory failure with hypoxia: Secondary | ICD-10-CM | POA: Diagnosis not present

## 2018-12-09 DIAGNOSIS — B3789 Other sites of candidiasis: Secondary | ICD-10-CM | POA: Diagnosis not present

## 2018-12-09 DIAGNOSIS — D696 Thrombocytopenia, unspecified: Secondary | ICD-10-CM | POA: Diagnosis not present

## 2018-12-09 DIAGNOSIS — R918 Other nonspecific abnormal finding of lung field: Secondary | ICD-10-CM | POA: Diagnosis not present

## 2018-12-09 DIAGNOSIS — N179 Acute kidney failure, unspecified: Secondary | ICD-10-CM | POA: Diagnosis not present

## 2018-12-09 DIAGNOSIS — K529 Noninfective gastroenteritis and colitis, unspecified: Secondary | ICD-10-CM | POA: Diagnosis not present

## 2018-12-09 DIAGNOSIS — R718 Other abnormality of red blood cells: Secondary | ICD-10-CM | POA: Diagnosis not present

## 2018-12-09 DIAGNOSIS — B258 Other cytomegaloviral diseases: Secondary | ICD-10-CM | POA: Diagnosis not present

## 2018-12-09 DIAGNOSIS — E8809 Other disorders of plasma-protein metabolism, not elsewhere classified: Secondary | ICD-10-CM | POA: Diagnosis not present

## 2018-12-09 DIAGNOSIS — D761 Hemophagocytic lymphohistiocytosis: Secondary | ICD-10-CM | POA: Diagnosis not present

## 2018-12-09 DIAGNOSIS — R911 Solitary pulmonary nodule: Secondary | ICD-10-CM | POA: Diagnosis not present

## 2018-12-09 MED ORDER — GENERIC EXTERNAL MEDICATION
Status: DC
Start: 2018-12-31 — End: 2018-12-09

## 2018-12-09 MED ORDER — DEXTROMETHORPHAN-GUAIFENESIN 10-100 MG/5ML PO SYRP
5.00 | ORAL_SOLUTION | ORAL | Status: DC
Start: ? — End: 2018-12-09

## 2018-12-09 MED ORDER — HYDROXYZINE HCL 25 MG PO TABS
25.00 | ORAL_TABLET | ORAL | Status: DC
Start: ? — End: 2018-12-09

## 2018-12-09 MED ORDER — GENERIC EXTERNAL MEDICATION
80.00 | Status: DC
Start: 2018-12-10 — End: 2018-12-09

## 2018-12-09 MED ORDER — GENERIC EXTERNAL MEDICATION
1.00 | Status: DC
Start: 2018-12-13 — End: 2018-12-09

## 2018-12-10 DIAGNOSIS — R718 Other abnormality of red blood cells: Secondary | ICD-10-CM | POA: Diagnosis not present

## 2018-12-10 DIAGNOSIS — R918 Other nonspecific abnormal finding of lung field: Secondary | ICD-10-CM | POA: Diagnosis not present

## 2018-12-10 DIAGNOSIS — J9601 Acute respiratory failure with hypoxia: Secondary | ICD-10-CM | POA: Diagnosis not present

## 2018-12-10 DIAGNOSIS — B377 Candidal sepsis: Secondary | ICD-10-CM | POA: Diagnosis not present

## 2018-12-10 DIAGNOSIS — R109 Unspecified abdominal pain: Secondary | ICD-10-CM | POA: Diagnosis not present

## 2018-12-10 DIAGNOSIS — R0789 Other chest pain: Secondary | ICD-10-CM | POA: Diagnosis not present

## 2018-12-10 DIAGNOSIS — D761 Hemophagocytic lymphohistiocytosis: Secondary | ICD-10-CM | POA: Diagnosis not present

## 2018-12-10 DIAGNOSIS — B3789 Other sites of candidiasis: Secondary | ICD-10-CM | POA: Diagnosis not present

## 2018-12-10 DIAGNOSIS — B20 Human immunodeficiency virus [HIV] disease: Secondary | ICD-10-CM | POA: Diagnosis not present

## 2018-12-10 DIAGNOSIS — K529 Noninfective gastroenteritis and colitis, unspecified: Secondary | ICD-10-CM | POA: Diagnosis not present

## 2018-12-10 DIAGNOSIS — N179 Acute kidney failure, unspecified: Secondary | ICD-10-CM | POA: Diagnosis not present

## 2018-12-10 DIAGNOSIS — B258 Other cytomegaloviral diseases: Secondary | ICD-10-CM | POA: Diagnosis not present

## 2018-12-10 DIAGNOSIS — R911 Solitary pulmonary nodule: Secondary | ICD-10-CM | POA: Diagnosis not present

## 2018-12-10 DIAGNOSIS — D696 Thrombocytopenia, unspecified: Secondary | ICD-10-CM | POA: Diagnosis not present

## 2018-12-10 MED ORDER — K-PHOS-NEUTRAL 155-852-130 MG PO TABS
500.00 | ORAL_TABLET | ORAL | Status: DC
Start: 2018-12-10 — End: 2018-12-10

## 2018-12-11 DIAGNOSIS — N179 Acute kidney failure, unspecified: Secondary | ICD-10-CM | POA: Diagnosis not present

## 2018-12-11 DIAGNOSIS — B377 Candidal sepsis: Secondary | ICD-10-CM | POA: Diagnosis not present

## 2018-12-11 DIAGNOSIS — B20 Human immunodeficiency virus [HIV] disease: Secondary | ICD-10-CM | POA: Diagnosis not present

## 2018-12-11 DIAGNOSIS — D761 Hemophagocytic lymphohistiocytosis: Secondary | ICD-10-CM | POA: Diagnosis not present

## 2018-12-11 DIAGNOSIS — K529 Noninfective gastroenteritis and colitis, unspecified: Secondary | ICD-10-CM | POA: Diagnosis not present

## 2018-12-11 DIAGNOSIS — R911 Solitary pulmonary nodule: Secondary | ICD-10-CM | POA: Diagnosis not present

## 2018-12-11 DIAGNOSIS — D696 Thrombocytopenia, unspecified: Secondary | ICD-10-CM | POA: Diagnosis not present

## 2018-12-11 DIAGNOSIS — R718 Other abnormality of red blood cells: Secondary | ICD-10-CM | POA: Diagnosis not present

## 2018-12-11 DIAGNOSIS — R918 Other nonspecific abnormal finding of lung field: Secondary | ICD-10-CM | POA: Diagnosis not present

## 2018-12-11 MED ORDER — SODIUM CHLORIDE 0.9 % IV SOLN
500.00 | INTRAVENOUS | Status: DC
Start: ? — End: 2018-12-11

## 2018-12-11 MED ORDER — LAMIVUDINE 150 MG PO TABS
300.00 | ORAL_TABLET | ORAL | Status: DC
Start: 2018-12-13 — End: 2018-12-11

## 2018-12-12 DIAGNOSIS — R718 Other abnormality of red blood cells: Secondary | ICD-10-CM | POA: Diagnosis not present

## 2018-12-12 DIAGNOSIS — B3789 Other sites of candidiasis: Secondary | ICD-10-CM | POA: Diagnosis not present

## 2018-12-12 DIAGNOSIS — D696 Thrombocytopenia, unspecified: Secondary | ICD-10-CM | POA: Diagnosis not present

## 2018-12-12 DIAGNOSIS — R911 Solitary pulmonary nodule: Secondary | ICD-10-CM | POA: Diagnosis not present

## 2018-12-12 DIAGNOSIS — B20 Human immunodeficiency virus [HIV] disease: Secondary | ICD-10-CM | POA: Diagnosis not present

## 2018-12-12 DIAGNOSIS — B379 Candidiasis, unspecified: Secondary | ICD-10-CM | POA: Diagnosis not present

## 2018-12-12 DIAGNOSIS — R918 Other nonspecific abnormal finding of lung field: Secondary | ICD-10-CM | POA: Diagnosis not present

## 2018-12-12 DIAGNOSIS — K529 Noninfective gastroenteritis and colitis, unspecified: Secondary | ICD-10-CM | POA: Diagnosis not present

## 2018-12-12 DIAGNOSIS — D761 Hemophagocytic lymphohistiocytosis: Secondary | ICD-10-CM | POA: Diagnosis not present

## 2018-12-12 DIAGNOSIS — Z9221 Personal history of antineoplastic chemotherapy: Secondary | ICD-10-CM | POA: Diagnosis not present

## 2018-12-12 DIAGNOSIS — B377 Candidal sepsis: Secondary | ICD-10-CM | POA: Diagnosis not present

## 2018-12-12 DIAGNOSIS — B251 Cytomegaloviral hepatitis: Secondary | ICD-10-CM | POA: Diagnosis not present

## 2018-12-12 DIAGNOSIS — N179 Acute kidney failure, unspecified: Secondary | ICD-10-CM | POA: Diagnosis not present

## 2018-12-13 DIAGNOSIS — D696 Thrombocytopenia, unspecified: Secondary | ICD-10-CM | POA: Diagnosis not present

## 2018-12-13 DIAGNOSIS — B20 Human immunodeficiency virus [HIV] disease: Secondary | ICD-10-CM | POA: Diagnosis not present

## 2018-12-13 DIAGNOSIS — R918 Other nonspecific abnormal finding of lung field: Secondary | ICD-10-CM | POA: Diagnosis not present

## 2018-12-13 DIAGNOSIS — D761 Hemophagocytic lymphohistiocytosis: Secondary | ICD-10-CM | POA: Diagnosis not present

## 2018-12-13 DIAGNOSIS — B377 Candidal sepsis: Secondary | ICD-10-CM | POA: Diagnosis not present

## 2018-12-13 DIAGNOSIS — R911 Solitary pulmonary nodule: Secondary | ICD-10-CM | POA: Diagnosis not present

## 2018-12-13 DIAGNOSIS — K529 Noninfective gastroenteritis and colitis, unspecified: Secondary | ICD-10-CM | POA: Diagnosis not present

## 2018-12-13 DIAGNOSIS — N179 Acute kidney failure, unspecified: Secondary | ICD-10-CM | POA: Diagnosis not present

## 2018-12-13 DIAGNOSIS — R718 Other abnormality of red blood cells: Secondary | ICD-10-CM | POA: Diagnosis not present

## 2018-12-13 DIAGNOSIS — Z9221 Personal history of antineoplastic chemotherapy: Secondary | ICD-10-CM | POA: Diagnosis not present

## 2018-12-13 DIAGNOSIS — B3789 Other sites of candidiasis: Secondary | ICD-10-CM | POA: Diagnosis not present

## 2018-12-13 DIAGNOSIS — B251 Cytomegaloviral hepatitis: Secondary | ICD-10-CM | POA: Diagnosis not present

## 2018-12-13 DIAGNOSIS — B379 Candidiasis, unspecified: Secondary | ICD-10-CM | POA: Diagnosis not present

## 2018-12-13 MED ORDER — LACTATED RINGERS IV SOLN
INTRAVENOUS | Status: DC
Start: ? — End: 2018-12-13

## 2018-12-13 MED ORDER — VORICONAZOLE 200 MG PO TABS
200.00 | ORAL_TABLET | ORAL | Status: DC
Start: 2018-12-13 — End: 2018-12-13

## 2018-12-13 MED ORDER — SODIUM BICARBONATE 650 MG PO TABS
650.00 | ORAL_TABLET | ORAL | Status: DC
Start: 2018-12-13 — End: 2018-12-13

## 2018-12-14 DIAGNOSIS — E871 Hypo-osmolality and hyponatremia: Secondary | ICD-10-CM | POA: Diagnosis not present

## 2018-12-14 DIAGNOSIS — N179 Acute kidney failure, unspecified: Secondary | ICD-10-CM | POA: Diagnosis not present

## 2018-12-14 DIAGNOSIS — B259 Cytomegaloviral disease, unspecified: Secondary | ICD-10-CM | POA: Diagnosis not present

## 2018-12-14 DIAGNOSIS — R911 Solitary pulmonary nodule: Secondary | ICD-10-CM | POA: Diagnosis not present

## 2018-12-14 DIAGNOSIS — B379 Candidiasis, unspecified: Secondary | ICD-10-CM | POA: Diagnosis not present

## 2018-12-14 DIAGNOSIS — J984 Other disorders of lung: Secondary | ICD-10-CM | POA: Diagnosis not present

## 2018-12-14 DIAGNOSIS — R000 Tachycardia, unspecified: Secondary | ICD-10-CM | POA: Diagnosis not present

## 2018-12-14 DIAGNOSIS — D761 Hemophagocytic lymphohistiocytosis: Secondary | ICD-10-CM | POA: Diagnosis not present

## 2018-12-14 DIAGNOSIS — B20 Human immunodeficiency virus [HIV] disease: Secondary | ICD-10-CM | POA: Diagnosis not present

## 2018-12-14 DIAGNOSIS — B251 Cytomegaloviral hepatitis: Secondary | ICD-10-CM | POA: Diagnosis not present

## 2018-12-14 DIAGNOSIS — B377 Candidal sepsis: Secondary | ICD-10-CM | POA: Diagnosis not present

## 2018-12-15 DIAGNOSIS — J984 Other disorders of lung: Secondary | ICD-10-CM | POA: Diagnosis not present

## 2018-12-15 DIAGNOSIS — B377 Candidal sepsis: Secondary | ICD-10-CM | POA: Diagnosis not present

## 2018-12-15 DIAGNOSIS — B259 Cytomegaloviral disease, unspecified: Secondary | ICD-10-CM | POA: Diagnosis not present

## 2018-12-15 DIAGNOSIS — N179 Acute kidney failure, unspecified: Secondary | ICD-10-CM | POA: Diagnosis not present

## 2018-12-15 DIAGNOSIS — R911 Solitary pulmonary nodule: Secondary | ICD-10-CM | POA: Diagnosis not present

## 2018-12-15 DIAGNOSIS — R000 Tachycardia, unspecified: Secondary | ICD-10-CM | POA: Diagnosis not present

## 2018-12-15 DIAGNOSIS — E871 Hypo-osmolality and hyponatremia: Secondary | ICD-10-CM | POA: Diagnosis not present

## 2018-12-15 DIAGNOSIS — B20 Human immunodeficiency virus [HIV] disease: Secondary | ICD-10-CM | POA: Diagnosis not present

## 2018-12-15 DIAGNOSIS — D761 Hemophagocytic lymphohistiocytosis: Secondary | ICD-10-CM | POA: Diagnosis not present

## 2018-12-16 DIAGNOSIS — R918 Other nonspecific abnormal finding of lung field: Secondary | ICD-10-CM | POA: Diagnosis not present

## 2018-12-16 DIAGNOSIS — R911 Solitary pulmonary nodule: Secondary | ICD-10-CM | POA: Diagnosis not present

## 2018-12-16 DIAGNOSIS — N179 Acute kidney failure, unspecified: Secondary | ICD-10-CM | POA: Diagnosis not present

## 2018-12-16 DIAGNOSIS — J984 Other disorders of lung: Secondary | ICD-10-CM | POA: Diagnosis not present

## 2018-12-16 DIAGNOSIS — E871 Hypo-osmolality and hyponatremia: Secondary | ICD-10-CM | POA: Diagnosis not present

## 2018-12-16 DIAGNOSIS — B259 Cytomegaloviral disease, unspecified: Secondary | ICD-10-CM | POA: Diagnosis not present

## 2018-12-16 DIAGNOSIS — D761 Hemophagocytic lymphohistiocytosis: Secondary | ICD-10-CM | POA: Diagnosis not present

## 2018-12-16 DIAGNOSIS — R000 Tachycardia, unspecified: Secondary | ICD-10-CM | POA: Diagnosis not present

## 2018-12-16 DIAGNOSIS — B377 Candidal sepsis: Secondary | ICD-10-CM | POA: Diagnosis not present

## 2018-12-16 DIAGNOSIS — B20 Human immunodeficiency virus [HIV] disease: Secondary | ICD-10-CM | POA: Diagnosis not present

## 2018-12-16 DIAGNOSIS — D696 Thrombocytopenia, unspecified: Secondary | ICD-10-CM | POA: Diagnosis not present

## 2018-12-16 MED ORDER — DEXAMETHASONE 4 MG PO TABS
4.00 | ORAL_TABLET | ORAL | Status: DC
Start: 2018-12-17 — End: 2018-12-16

## 2018-12-16 MED ORDER — GENERIC EXTERNAL MEDICATION
1.00 | Status: DC
Start: ? — End: 2018-12-16

## 2018-12-16 MED ORDER — GENERIC EXTERNAL MEDICATION
1.00 | Status: DC
Start: 2019-01-06 — End: 2018-12-16

## 2018-12-16 MED ORDER — LAMIVUDINE 150 MG PO TABS
300.00 | ORAL_TABLET | ORAL | Status: DC
Start: 2019-01-07 — End: 2018-12-16

## 2018-12-16 MED ORDER — FENTANYL CITRATE (PF) 50 MCG/ML IJ SOLN
50.00 | INTRAMUSCULAR | Status: DC
Start: ? — End: 2018-12-16

## 2018-12-16 MED ORDER — VORICONAZOLE 200 MG PO TABS
200.00 | ORAL_TABLET | ORAL | Status: DC
Start: 2019-01-06 — End: 2018-12-16

## 2018-12-16 MED ORDER — SULFAMETHOXAZOLE-TRIMETHOPRIM 800-160 MG PO TABS
1.00 | ORAL_TABLET | ORAL | Status: DC
Start: 2019-01-08 — End: 2018-12-16

## 2018-12-16 MED ORDER — SODIUM CHLORIDE 0.9 % IV SOLN
250.00 | INTRAVENOUS | Status: DC
Start: ? — End: 2018-12-16

## 2018-12-16 MED ORDER — AZITHROMYCIN 600 MG PO TABS
600.00 | ORAL_TABLET | ORAL | Status: DC
Start: 2018-12-19 — End: 2018-12-16

## 2018-12-16 MED ORDER — OXYCODONE HCL 5 MG/5ML PO SOLN
5.00 | ORAL | Status: DC
Start: ? — End: 2018-12-16

## 2018-12-16 MED ORDER — DOLUTEGRAVIR SODIUM 50 MG PO TABS
50.00 | ORAL_TABLET | ORAL | Status: DC
Start: 2019-01-07 — End: 2018-12-16

## 2018-12-17 DIAGNOSIS — R918 Other nonspecific abnormal finding of lung field: Secondary | ICD-10-CM | POA: Diagnosis not present

## 2018-12-17 DIAGNOSIS — B377 Candidal sepsis: Secondary | ICD-10-CM | POA: Diagnosis not present

## 2018-12-17 DIAGNOSIS — E871 Hypo-osmolality and hyponatremia: Secondary | ICD-10-CM | POA: Diagnosis not present

## 2018-12-17 DIAGNOSIS — N179 Acute kidney failure, unspecified: Secondary | ICD-10-CM | POA: Diagnosis not present

## 2018-12-17 DIAGNOSIS — D761 Hemophagocytic lymphohistiocytosis: Secondary | ICD-10-CM | POA: Diagnosis not present

## 2018-12-17 DIAGNOSIS — R911 Solitary pulmonary nodule: Secondary | ICD-10-CM | POA: Diagnosis not present

## 2018-12-17 DIAGNOSIS — B20 Human immunodeficiency virus [HIV] disease: Secondary | ICD-10-CM | POA: Diagnosis not present

## 2018-12-17 DIAGNOSIS — J984 Other disorders of lung: Secondary | ICD-10-CM | POA: Diagnosis not present

## 2018-12-17 DIAGNOSIS — R000 Tachycardia, unspecified: Secondary | ICD-10-CM | POA: Diagnosis not present

## 2018-12-17 DIAGNOSIS — B001 Herpesviral vesicular dermatitis: Secondary | ICD-10-CM | POA: Diagnosis not present

## 2018-12-17 DIAGNOSIS — D696 Thrombocytopenia, unspecified: Secondary | ICD-10-CM | POA: Diagnosis not present

## 2018-12-17 DIAGNOSIS — B259 Cytomegaloviral disease, unspecified: Secondary | ICD-10-CM | POA: Diagnosis not present

## 2018-12-17 MED ORDER — LACTATED RINGERS IV SOLN
INTRAVENOUS | Status: DC
Start: ? — End: 2018-12-17

## 2018-12-17 MED ORDER — DEXAMETHASONE 2 MG PO TABS
2.00 | ORAL_TABLET | ORAL | Status: DC
Start: 2018-12-25 — End: 2018-12-17

## 2018-12-18 DIAGNOSIS — B379 Candidiasis, unspecified: Secondary | ICD-10-CM | POA: Diagnosis not present

## 2018-12-18 DIAGNOSIS — J939 Pneumothorax, unspecified: Secondary | ICD-10-CM | POA: Diagnosis not present

## 2018-12-18 DIAGNOSIS — L89329 Pressure ulcer of left buttock, unspecified stage: Secondary | ICD-10-CM | POA: Diagnosis not present

## 2018-12-18 DIAGNOSIS — B377 Candidal sepsis: Secondary | ICD-10-CM | POA: Diagnosis not present

## 2018-12-18 DIAGNOSIS — R000 Tachycardia, unspecified: Secondary | ICD-10-CM | POA: Diagnosis not present

## 2018-12-18 DIAGNOSIS — R911 Solitary pulmonary nodule: Secondary | ICD-10-CM | POA: Diagnosis not present

## 2018-12-18 DIAGNOSIS — B251 Cytomegaloviral hepatitis: Secondary | ICD-10-CM | POA: Diagnosis not present

## 2018-12-18 DIAGNOSIS — R1312 Dysphagia, oropharyngeal phase: Secondary | ICD-10-CM | POA: Diagnosis not present

## 2018-12-18 DIAGNOSIS — R109 Unspecified abdominal pain: Secondary | ICD-10-CM | POA: Diagnosis not present

## 2018-12-18 DIAGNOSIS — R918 Other nonspecific abnormal finding of lung field: Secondary | ICD-10-CM | POA: Diagnosis not present

## 2018-12-18 DIAGNOSIS — R633 Feeding difficulties: Secondary | ICD-10-CM | POA: Diagnosis not present

## 2018-12-18 DIAGNOSIS — B20 Human immunodeficiency virus [HIV] disease: Secondary | ICD-10-CM | POA: Diagnosis not present

## 2018-12-18 DIAGNOSIS — D739 Disease of spleen, unspecified: Secondary | ICD-10-CM | POA: Diagnosis not present

## 2018-12-18 DIAGNOSIS — J9601 Acute respiratory failure with hypoxia: Secondary | ICD-10-CM | POA: Diagnosis not present

## 2018-12-18 DIAGNOSIS — E871 Hypo-osmolality and hyponatremia: Secondary | ICD-10-CM | POA: Diagnosis not present

## 2018-12-18 DIAGNOSIS — D696 Thrombocytopenia, unspecified: Secondary | ICD-10-CM | POA: Diagnosis not present

## 2018-12-18 DIAGNOSIS — B259 Cytomegaloviral disease, unspecified: Secondary | ICD-10-CM | POA: Diagnosis not present

## 2018-12-18 DIAGNOSIS — D761 Hemophagocytic lymphohistiocytosis: Secondary | ICD-10-CM | POA: Diagnosis not present

## 2018-12-18 DIAGNOSIS — R160 Hepatomegaly, not elsewhere classified: Secondary | ICD-10-CM | POA: Diagnosis not present

## 2018-12-18 DIAGNOSIS — J984 Other disorders of lung: Secondary | ICD-10-CM | POA: Diagnosis not present

## 2018-12-18 DIAGNOSIS — N179 Acute kidney failure, unspecified: Secondary | ICD-10-CM | POA: Diagnosis not present

## 2018-12-18 DIAGNOSIS — L89319 Pressure ulcer of right buttock, unspecified stage: Secondary | ICD-10-CM | POA: Diagnosis not present

## 2018-12-18 MED ORDER — MAGNESIUM SULFATE 2 GM/50ML IV SOLN
2.00 | INTRAVENOUS | Status: DC
Start: 2018-12-18 — End: 2018-12-18

## 2018-12-19 ENCOUNTER — Other Ambulatory Visit: Payer: Self-pay | Admitting: *Deleted

## 2018-12-19 DIAGNOSIS — D761 Hemophagocytic lymphohistiocytosis: Secondary | ICD-10-CM | POA: Diagnosis not present

## 2018-12-19 DIAGNOSIS — B20 Human immunodeficiency virus [HIV] disease: Secondary | ICD-10-CM | POA: Diagnosis not present

## 2018-12-19 DIAGNOSIS — B259 Cytomegaloviral disease, unspecified: Secondary | ICD-10-CM | POA: Diagnosis not present

## 2018-12-19 DIAGNOSIS — N179 Acute kidney failure, unspecified: Secondary | ICD-10-CM | POA: Diagnosis not present

## 2018-12-19 DIAGNOSIS — B377 Candidal sepsis: Secondary | ICD-10-CM | POA: Diagnosis not present

## 2018-12-19 DIAGNOSIS — B49 Unspecified mycosis: Secondary | ICD-10-CM | POA: Diagnosis not present

## 2018-12-19 DIAGNOSIS — J939 Pneumothorax, unspecified: Secondary | ICD-10-CM | POA: Diagnosis not present

## 2018-12-19 DIAGNOSIS — J17 Pneumonia in diseases classified elsewhere: Secondary | ICD-10-CM | POA: Diagnosis not present

## 2018-12-19 DIAGNOSIS — R911 Solitary pulmonary nodule: Secondary | ICD-10-CM | POA: Diagnosis not present

## 2018-12-19 DIAGNOSIS — R000 Tachycardia, unspecified: Secondary | ICD-10-CM | POA: Diagnosis not present

## 2018-12-19 DIAGNOSIS — E871 Hypo-osmolality and hyponatremia: Secondary | ICD-10-CM | POA: Diagnosis not present

## 2018-12-19 DIAGNOSIS — J984 Other disorders of lung: Secondary | ICD-10-CM | POA: Diagnosis not present

## 2018-12-19 DIAGNOSIS — D696 Thrombocytopenia, unspecified: Secondary | ICD-10-CM | POA: Diagnosis not present

## 2018-12-19 NOTE — Patient Outreach (Signed)
[name] [facility]) Care Management  12/19/2018  Krystal Short 1985/07/08 [id number]   Monthly telephonic UMR case management review with UMR RNCMs and Dr. Alonza Bogus, [place] Director. Update provided patient remains in patient at [place] electronic record anticipate need for rehab at discharge.  No transition of care call yet.       [name], RN, [place] Management Coordinator  [phone]- Mobile [phone]- Toll Free Main Office

## 2018-12-20 DIAGNOSIS — N179 Acute kidney failure, unspecified: Secondary | ICD-10-CM | POA: Diagnosis not present

## 2018-12-20 DIAGNOSIS — R000 Tachycardia, unspecified: Secondary | ICD-10-CM | POA: Diagnosis not present

## 2018-12-20 DIAGNOSIS — B20 Human immunodeficiency virus [HIV] disease: Secondary | ICD-10-CM | POA: Diagnosis not present

## 2018-12-20 DIAGNOSIS — J984 Other disorders of lung: Secondary | ICD-10-CM | POA: Diagnosis not present

## 2018-12-20 DIAGNOSIS — B001 Herpesviral vesicular dermatitis: Secondary | ICD-10-CM | POA: Diagnosis not present

## 2018-12-20 DIAGNOSIS — E871 Hypo-osmolality and hyponatremia: Secondary | ICD-10-CM | POA: Diagnosis not present

## 2018-12-20 DIAGNOSIS — B259 Cytomegaloviral disease, unspecified: Secondary | ICD-10-CM | POA: Diagnosis not present

## 2018-12-20 DIAGNOSIS — D761 Hemophagocytic lymphohistiocytosis: Secondary | ICD-10-CM | POA: Diagnosis not present

## 2018-12-20 DIAGNOSIS — R1011 Right upper quadrant pain: Secondary | ICD-10-CM | POA: Diagnosis not present

## 2018-12-20 DIAGNOSIS — J9691 Respiratory failure, unspecified with hypoxia: Secondary | ICD-10-CM | POA: Diagnosis not present

## 2018-12-20 DIAGNOSIS — D696 Thrombocytopenia, unspecified: Secondary | ICD-10-CM | POA: Diagnosis not present

## 2018-12-20 DIAGNOSIS — R918 Other nonspecific abnormal finding of lung field: Secondary | ICD-10-CM | POA: Diagnosis not present

## 2018-12-20 DIAGNOSIS — J9601 Acute respiratory failure with hypoxia: Secondary | ICD-10-CM | POA: Diagnosis not present

## 2018-12-20 DIAGNOSIS — J939 Pneumothorax, unspecified: Secondary | ICD-10-CM | POA: Diagnosis not present

## 2018-12-20 DIAGNOSIS — R911 Solitary pulmonary nodule: Secondary | ICD-10-CM | POA: Diagnosis not present

## 2018-12-20 DIAGNOSIS — B377 Candidal sepsis: Secondary | ICD-10-CM | POA: Diagnosis not present

## 2018-12-21 DIAGNOSIS — J9601 Acute respiratory failure with hypoxia: Secondary | ICD-10-CM | POA: Diagnosis not present

## 2018-12-21 DIAGNOSIS — J948 Other specified pleural conditions: Secondary | ICD-10-CM | POA: Diagnosis not present

## 2018-12-21 DIAGNOSIS — B001 Herpesviral vesicular dermatitis: Secondary | ICD-10-CM | POA: Diagnosis not present

## 2018-12-21 DIAGNOSIS — R1011 Right upper quadrant pain: Secondary | ICD-10-CM | POA: Diagnosis not present

## 2018-12-21 DIAGNOSIS — R918 Other nonspecific abnormal finding of lung field: Secondary | ICD-10-CM | POA: Diagnosis not present

## 2018-12-21 DIAGNOSIS — D761 Hemophagocytic lymphohistiocytosis: Secondary | ICD-10-CM | POA: Diagnosis not present

## 2018-12-21 DIAGNOSIS — J939 Pneumothorax, unspecified: Secondary | ICD-10-CM | POA: Diagnosis not present

## 2018-12-21 DIAGNOSIS — B259 Cytomegaloviral disease, unspecified: Secondary | ICD-10-CM | POA: Diagnosis not present

## 2018-12-21 DIAGNOSIS — J9691 Respiratory failure, unspecified with hypoxia: Secondary | ICD-10-CM | POA: Diagnosis not present

## 2018-12-21 DIAGNOSIS — R042 Hemoptysis: Secondary | ICD-10-CM | POA: Diagnosis not present

## 2018-12-21 DIAGNOSIS — B20 Human immunodeficiency virus [HIV] disease: Secondary | ICD-10-CM | POA: Diagnosis not present

## 2018-12-21 DIAGNOSIS — R040 Epistaxis: Secondary | ICD-10-CM | POA: Diagnosis not present

## 2018-12-21 DIAGNOSIS — B371 Pulmonary candidiasis: Secondary | ICD-10-CM | POA: Diagnosis not present

## 2018-12-21 DIAGNOSIS — J189 Pneumonia, unspecified organism: Secondary | ICD-10-CM | POA: Diagnosis not present

## 2018-12-21 DIAGNOSIS — J9383 Other pneumothorax: Secondary | ICD-10-CM | POA: Diagnosis not present

## 2018-12-22 DIAGNOSIS — D761 Hemophagocytic lymphohistiocytosis: Secondary | ICD-10-CM | POA: Diagnosis not present

## 2018-12-22 DIAGNOSIS — J9601 Acute respiratory failure with hypoxia: Secondary | ICD-10-CM | POA: Diagnosis not present

## 2018-12-22 DIAGNOSIS — J948 Other specified pleural conditions: Secondary | ICD-10-CM | POA: Diagnosis not present

## 2018-12-22 DIAGNOSIS — R918 Other nonspecific abnormal finding of lung field: Secondary | ICD-10-CM | POA: Diagnosis not present

## 2018-12-22 DIAGNOSIS — J939 Pneumothorax, unspecified: Secondary | ICD-10-CM | POA: Diagnosis not present

## 2018-12-22 DIAGNOSIS — B20 Human immunodeficiency virus [HIV] disease: Secondary | ICD-10-CM | POA: Diagnosis not present

## 2018-12-22 DIAGNOSIS — B001 Herpesviral vesicular dermatitis: Secondary | ICD-10-CM | POA: Diagnosis not present

## 2018-12-22 DIAGNOSIS — R1011 Right upper quadrant pain: Secondary | ICD-10-CM | POA: Diagnosis not present

## 2018-12-22 DIAGNOSIS — J189 Pneumonia, unspecified organism: Secondary | ICD-10-CM | POA: Diagnosis not present

## 2018-12-22 DIAGNOSIS — B371 Pulmonary candidiasis: Secondary | ICD-10-CM | POA: Diagnosis not present

## 2018-12-22 DIAGNOSIS — J9691 Respiratory failure, unspecified with hypoxia: Secondary | ICD-10-CM | POA: Diagnosis not present

## 2018-12-22 DIAGNOSIS — R040 Epistaxis: Secondary | ICD-10-CM | POA: Diagnosis not present

## 2018-12-22 DIAGNOSIS — B259 Cytomegaloviral disease, unspecified: Secondary | ICD-10-CM | POA: Diagnosis not present

## 2018-12-23 DIAGNOSIS — D72829 Elevated white blood cell count, unspecified: Secondary | ICD-10-CM | POA: Diagnosis not present

## 2018-12-23 DIAGNOSIS — J9601 Acute respiratory failure with hypoxia: Secondary | ICD-10-CM | POA: Diagnosis not present

## 2018-12-23 DIAGNOSIS — B20 Human immunodeficiency virus [HIV] disease: Secondary | ICD-10-CM | POA: Diagnosis not present

## 2018-12-23 DIAGNOSIS — J189 Pneumonia, unspecified organism: Secondary | ICD-10-CM | POA: Diagnosis not present

## 2018-12-23 DIAGNOSIS — N179 Acute kidney failure, unspecified: Secondary | ICD-10-CM | POA: Diagnosis not present

## 2018-12-23 DIAGNOSIS — Y95 Nosocomial condition: Secondary | ICD-10-CM | POA: Diagnosis not present

## 2018-12-23 DIAGNOSIS — R1011 Right upper quadrant pain: Secondary | ICD-10-CM | POA: Diagnosis not present

## 2018-12-23 DIAGNOSIS — B449 Aspergillosis, unspecified: Secondary | ICD-10-CM | POA: Diagnosis not present

## 2018-12-23 DIAGNOSIS — B259 Cytomegaloviral disease, unspecified: Secondary | ICD-10-CM | POA: Diagnosis not present

## 2018-12-23 DIAGNOSIS — D761 Hemophagocytic lymphohistiocytosis: Secondary | ICD-10-CM | POA: Diagnosis not present

## 2018-12-23 DIAGNOSIS — J948 Other specified pleural conditions: Secondary | ICD-10-CM | POA: Diagnosis not present

## 2018-12-23 DIAGNOSIS — R918 Other nonspecific abnormal finding of lung field: Secondary | ICD-10-CM | POA: Diagnosis not present

## 2018-12-24 MED ORDER — VANCOMYCIN HCL IN NACL 750-0.9 MG/150ML-% IV SOLN
750.00 | INTRAVENOUS | Status: DC
Start: 2018-12-25 — End: 2018-12-24

## 2018-12-24 MED ORDER — GENERIC EXTERNAL MEDICATION
20.00 | Status: DC
Start: 2019-01-12 — End: 2018-12-24

## 2018-12-24 MED ORDER — GENERIC EXTERNAL MEDICATION
20.00 | Status: DC
Start: ? — End: 2018-12-24

## 2018-12-24 MED ORDER — PHENYLEPHRINE HCL 0.25 % NA SOLN
1.00 | NASAL | Status: DC
Start: ? — End: 2018-12-24

## 2018-12-24 MED ORDER — LABETALOL HCL 5 MG/ML IV SOLN
10.00 | INTRAVENOUS | Status: DC
Start: ? — End: 2018-12-24

## 2018-12-24 MED ORDER — GENERIC EXTERNAL MEDICATION
1.00 | Status: DC
Start: 2019-01-11 — End: 2018-12-24

## 2018-12-24 MED ORDER — VALGANCICLOVIR HCL 450 MG PO TABS
900.00 | ORAL_TABLET | ORAL | Status: DC
Start: 2018-12-24 — End: 2018-12-24

## 2018-12-24 MED ORDER — GENERIC EXTERNAL MEDICATION
1.00 | Status: DC
Start: 2018-12-27 — End: 2018-12-24

## 2018-12-24 MED ORDER — DEXAMETHASONE 0.5 MG PO TABS
1.50 | ORAL_TABLET | ORAL | Status: DC
Start: 2018-12-27 — End: 2018-12-24

## 2018-12-24 MED ORDER — MUPIROCIN 2 % EX OINT
TOPICAL_OINTMENT | CUTANEOUS | Status: DC
Start: 2018-12-24 — End: 2018-12-24

## 2018-12-26 MED ORDER — GENERIC EXTERNAL MEDICATION
5.00 | Status: DC
Start: 2019-01-10 — End: 2018-12-26

## 2018-12-26 MED ORDER — DEXTROSE 5 % IV SOLN
INTRAVENOUS | Status: DC
Start: ? — End: 2018-12-26

## 2018-12-27 MED ORDER — GENERIC EXTERNAL MEDICATION
100.00 | Status: DC
Start: 2018-12-31 — End: 2018-12-27

## 2018-12-27 MED ORDER — DEXAMETHASONE 0.5 MG PO TABS
1.00 | ORAL_TABLET | ORAL | Status: DC
Start: 2018-12-31 — End: 2018-12-27

## 2018-12-30 MED ORDER — GENERIC EXTERNAL MEDICATION
1.00 | Status: DC
Start: 2019-01-07 — End: 2018-12-30

## 2018-12-30 MED ORDER — VANCOMYCIN HCL IN NACL 750-0.9 MG/150ML-% IV SOLN
750.00 | INTRAVENOUS | Status: DC
Start: 2018-12-31 — End: 2018-12-30

## 2018-12-31 MED ORDER — K-PHOS-NEUTRAL 155-852-130 MG PO TABS
500.00 | ORAL_TABLET | ORAL | Status: DC
Start: 2018-12-31 — End: 2018-12-31

## 2018-12-31 MED ORDER — MAGNESIUM SULFATE 2 GM/50ML IV SOLN
2.00 | INTRAVENOUS | Status: DC
Start: 2018-12-31 — End: 2018-12-31

## 2019-01-06 MED ORDER — VORICONAZOLE 200 MG PO TABS
200.00 | ORAL_TABLET | ORAL | Status: DC
Start: 2019-01-11 — End: 2019-01-06

## 2019-01-06 MED ORDER — METOPROLOL TARTRATE 25 MG PO TABS
25.00 | ORAL_TABLET | ORAL | Status: DC
Start: 2019-01-11 — End: 2019-01-06

## 2019-01-06 MED ORDER — GENERIC EXTERNAL MEDICATION
1.00 | Status: DC
Start: 2019-01-11 — End: 2019-01-06

## 2019-01-06 MED ORDER — DEXAMETHASONE 0.5 MG PO TABS
0.50 | ORAL_TABLET | ORAL | Status: DC
Start: 2019-01-07 — End: 2019-01-06

## 2019-01-06 MED ORDER — DOLUTEGRAVIR SODIUM 50 MG PO TABS
50.00 | ORAL_TABLET | ORAL | Status: DC
Start: 2019-01-12 — End: 2019-01-06

## 2019-01-06 MED ORDER — LAMIVUDINE 150 MG PO TABS
300.00 | ORAL_TABLET | ORAL | Status: DC
Start: 2019-01-12 — End: 2019-01-06

## 2019-01-06 MED ORDER — SULFAMETHOXAZOLE-TRIMETHOPRIM 800-160 MG PO TABS
1.00 | ORAL_TABLET | ORAL | Status: DC
Start: 2019-01-13 — End: 2019-01-06

## 2019-01-06 MED ORDER — DEXAMETHASONE 0.5 MG PO TABS
0.50 | ORAL_TABLET | ORAL | Status: DC
Start: 2019-01-11 — End: 2019-01-06

## 2019-01-06 MED ORDER — GUAIFENESIN 100 MG/5ML PO LIQD
200.00 | ORAL | Status: DC
Start: ? — End: 2019-01-06

## 2019-01-10 MED ORDER — PRONUTRA PO POWD
900.00 | ORAL | Status: DC
Start: 2019-01-11 — End: 2019-01-10

## 2019-01-10 MED ORDER — BL ANTI-ITCH EX
2.00 | CUTANEOUS | Status: DC
Start: 2019-01-10 — End: 2019-01-10

## 2019-01-14 ENCOUNTER — Encounter: Payer: Self-pay | Admitting: *Deleted

## 2019-01-14 ENCOUNTER — Other Ambulatory Visit: Payer: Self-pay | Admitting: *Deleted

## 2019-01-14 NOTE — Patient Outreach (Signed)
[name]) Care Management  01/14/2019  Krystal Short 01/08/1986 [id number]   Transition of care call  Referral received:12/04/18 Initial outreach:01/14/19 Insurance: [facility] choice plan    Subjective: Initial unsuccessful  telephone call to patient's preferred number in order to complete transition of care assessment;no answer able to leave a HIPAA compliant message for return call.   1500 Returned call to patient,   2 HIPAA identifiers verified. Explained purpose of call and completed transition of care assessment.  Krystal Short states that she is doing okay, her voice is very low, raspy ,request that I speak with her mother,  she gave consent that I speak also with her mother Krystal Short also on return calls . Patient mother discussed patient prolonged hospital stay, history requiring intubation 4 times.  Patient mother states that they are working together to get patient back on her feet. She discussed patient  pain in her back  is controlled with use of prn medications. She discussed patient decrease in cough, denies shortness of breath, states her breathing is calm not heavy and not too fast. Discussed if they had pulse oximetry and how used to measure oxygen level  , she states no, provided information on where to purchase in community, drug stores, [employer], [place], she states they would be interested in obtaining one, they monitor patient blood pressure and temperature at home, states all is good. Discussed other equipment need identified at discharge, recommended wheelchair and ramp, she states they decided not to pursue that as their goal is to get her back to walking and wheelchairs are available at MD offices. She discussed patient husband and brother carried her from car into home, up four steps. She reports Krystal Short is walking about 12 to 15 feet twice a day in the home with family support and use of walker, she sits up on side of bed to have meals .  She discussed  orders for home health physical therapy, but has not received a call yet. Patient mother reports working on exercises that patient was doing while in the hospital twice daily.  She reports patient has good appetite and eating a regular diet and tolerating well. She reports previous wound site at patient sacral area almost healed, she is sleeping in a regular bed and they are keeping her turned about every 3 hours and continuing wound care ointment daily . Krystal Short KitchenShe denies Krystal Short having  bowel or bladder problems states both are working well.  Patient spouse, brother and mother are providing 24 hours care during recovery.   Patient mother states that patient's husband has been following up with  patient benefits has got extension on her leave. She does not have the hospital indemnity She uses a  [facility] outpatient pharmacy.  She denies educational needs related to staying safe during the COVID 19 pandemic.    Objective:  Krystal Short  was hospitalized at [facility]  From 11/02/18 to 01/11/19 ( Transferred from [place] after hospital admission 10/29/18 -11/02/18)  For HIV AIDS, [place] ( Hemophagocytic lymphohistiocytosis ) pneumothorax,Respiratory failure, intubation ,  Aspergillus  Fungal Pneumonia, CMV  Comorbidities include: HIV AIDS,  chronic thrombocytopenia,HLH. This was Krystal Short 8th hospital admission on this year.  She  was discharged to home on 01/11/19 with home health physical and occupational therapy arranged  by [facility] home health.Noted recommended DME of wheelchair and ramp. .Patient , family declined need for wheelchair or ramp at this time, patient mother verifies that they have a prescription for wheelchair  if anything changes.  Call to [place] at [place] [facility] to follow up on patient home health, he states patient was accepted on 12/5 for  home health  PT/OT but on Sunday they had a change in staff being available and reached out case manager at [place] explained that he has  rerouted order to see if they will be able accept patient . He will contact me on next day.  Explained to patient mother that I would contacting home health agency and will follow up with her .     Assessment:  Patient mother voices good understanding of all discharge instructions. She has contacted Oncology office today  regarding questions she has about medications, she has all the ordered medications prescribed and filled at hospital prior to discharge,  but noted not having prescriptions for medications on list for lasix, potassium , magnesium and protonix, that patient my have been on previously. She also communicated with oncology office regarding weekly lab work recommended at discharge , CMV and Galactomanan if it needs to be done on this Wednesday or if 12/14 is soon enough. She verbalized that will be able to take patient to have labs done on this week if recommended.  See transition of care flowsheet for assessment details.   Plan:  Reviewed hospital discharge diagnosis of fungal pneumonia due to aspergillosis, Anemia, AIDS, HLH   and discharge treatment plan using hospital discharge instructions, assessing medication adherence, reviewing problems requiring provider notification, and discussing the importance of follow up with , primary care provider and/or specialists as directed. Reviewed [place]'s announcements that all [place] members will receive the Healthy Lifestyle Premium rate in 2021.  Will plan follow up call in the next 2 business day to follow up on home health, verify patient has scheduled office visit with infectious disease as well as already scheduled appointment with Oncology.   Will send successful outreach letter to patient .    Krystal Draft, RN, [place] Management Coordinator  [phone]- Mobile [phone]- Toll Free Main Office

## 2019-01-15 ENCOUNTER — Other Ambulatory Visit: Payer: Self-pay | Admitting: *Deleted

## 2019-01-15 DIAGNOSIS — D761 Hemophagocytic lymphohistiocytosis: Secondary | ICD-10-CM | POA: Diagnosis not present

## 2019-01-15 NOTE — Patient Outreach (Signed)
[facility]) Care Management  01/15/2019  Elmer Alessi Mar 04, 1985 [id number]   Transition of care call Follow up call   Referral received:12/04/18 Initial outreach: 01/14/19 Insurance: [place]    Subjective: Telephone outreach call to patient , no answer able to leave a HIPAA compliant message for return call.  Placed call to patient mother Nathen May, that Trissa Fankhauser has given agreement that we make speak with as she is assisting with arranging follow up appointments, and providing care for [place],  HIPAA verified x 2 identifiers. She reports Vanity doing really good on today, still sitting on the side of the bed since eating breakfast.   -Discussed follow up regarding home health spoke with Ronalee Belts from [facility] regarding home health discharge referral on yesterday , awaiting return call today.   -- I  placed call to [place] office able to leave a message with [place] CMA- to follow up on discharge recommendations  .Discussed weekly lab work on wednesdays for  [employer] and [place]., with results to be sent to Infection Disease at [facility], [phone] Fax# per discharge instructions to have local doctor perform   Also discussed patient mother questions regarding medication on list, that she did not receive prescription for at discharge and questions whether to continue.  Patient has orders for home health physical and occupational therapy only.   -Discussed with Nathen May , patient need for  follow up appointments with Infectious Disease, she reports having contact number and will call today to arrange office visit. She also states she believes Isley may have received a phone call from [place] office on yesterday, she will follow up with her and she also plans to contact office.    Objective:  .[place] was hospitalized at [facility]  From 11/02/18 to 01/11/19 ( Transferred from [place] after  hospital admission 10/29/18 -11/02/18)  For HIV AIDS, [place] ( Hemophagocytic lymphohistiocytosis ) pneumothorax,Respiratory failure, intubation ,  Aspergillus  Fungal Pneumonia, CMV  Comorbidities include: HIV AIDS,  chronic thrombocytopenia,HLH. This was J[place] 8th hospital admission on this year.  She  was discharged to home on 01/11/19 with home health physical and occupational therapy arranged  by [facility] home [facility].Noted recommended DME of wheelchair and ramp. .Patient , family declined need for wheelchair or ramp at this time, patient mother verifies that they have a prescription for wheelchair if anything changes.    Assessment:  Patient/ Mother  voices good understanding of all discharge instructions and is assisting Lorianne with follow up appointments and providing care in home.   1030 Return call from [place] from [facility] home health verifying home health physical therapy services will begin on today.  -Returned call to patient contact number , her mother Allea Hertzberg answers phone and verifies home health agency has spoken with patient spouse Marya Amsler to arrange initial visit date. She also reports arranging lab visit today at [facility] , patient brother to assist. Discussed call to PCP office regarding further arrangements for lab work to be done locally.  -Patient mother verifies patient is interested in receiving Glucerna samples arranged by previous Care Manager Kelli Churn. Explained that samples  could be delivered to her provider office, she is agreeable. Will follow up with Kelli Churn , and PCP regarding product being delivered there.     Plan:  Will plan follow up call in the next 3 business day to verify home health follow up and discuss any additional care management  needs.  [name], RN, [place] Management Coordinator  [phone]- Mobile [phone]- Toll Free Main Office

## 2019-01-16 ENCOUNTER — Other Ambulatory Visit: Payer: Self-pay | Admitting: *Deleted

## 2019-01-16 DIAGNOSIS — J9601 Acute respiratory failure with hypoxia: Secondary | ICD-10-CM | POA: Diagnosis not present

## 2019-01-16 DIAGNOSIS — L89322 Pressure ulcer of left buttock, stage 2: Secondary | ICD-10-CM | POA: Diagnosis not present

## 2019-01-16 DIAGNOSIS — J9383 Other pneumothorax: Secondary | ICD-10-CM | POA: Diagnosis not present

## 2019-01-16 DIAGNOSIS — B20 Human immunodeficiency virus [HIV] disease: Secondary | ICD-10-CM | POA: Diagnosis not present

## 2019-01-16 DIAGNOSIS — B259 Cytomegaloviral disease, unspecified: Secondary | ICD-10-CM | POA: Diagnosis not present

## 2019-01-16 DIAGNOSIS — D61818 Other pancytopenia: Secondary | ICD-10-CM | POA: Diagnosis not present

## 2019-01-16 DIAGNOSIS — N179 Acute kidney failure, unspecified: Secondary | ICD-10-CM | POA: Diagnosis not present

## 2019-01-16 DIAGNOSIS — B441 Other pulmonary aspergillosis: Secondary | ICD-10-CM | POA: Diagnosis not present

## 2019-01-16 DIAGNOSIS — D761 Hemophagocytic lymphohistiocytosis: Secondary | ICD-10-CM | POA: Diagnosis not present

## 2019-01-16 MED FILL — BIKTARVY 50-200-25 MG TABS: 50-200-25 | 30 days supply | Qty: 30 | Fill #0

## 2019-01-16 NOTE — Patient Outreach (Signed)
[facility]) Care Management  01/16/2019  Krystal Short [birth year] [id number]   Monthly telephonic UMR case management review with UMR RNCMs and Dr Alonza Bogus. Update provided to include:   Discussed transition of care call on 01/14/19, following patient discharge home from [facility] after admission 9/26-12/5/20, for Sepsis, Aspergillus fungal pneumonia, respiratory failure, intubations, Acute kidney injury, CRRT, HIV/AIDS, Hemophagocytic lymphohistiocytosis.  Discussed care coordination of home health orders with [facility] home health as arranged at discharge  for home health physical and occupational therapy only.  Care coordination call to PCP office regarding recommendations for weekly lab work for  CMV and galactomanan . Discussed patient planned follow up with hematalogy/oncology and Infectious disease at [facility].  Reviewed transition of care call and patient condition review at telephone visit.   Plan Will follow up on care coordination needs : Awaiting return call from PCP office regarding arrangement of weekly lab work per discharge instructions.  Planned follow up call to patient within next 2 business days.    [name], RN, [place] Management Coordinator  [phone]- Mobile [phone]- Toll Free Main Office

## 2019-01-16 NOTE — Patient Outreach (Signed)
[facility]) Care Management  01/16/2019  Krystal Short [birth year] [id number]   Transition of care call Care Coordination   Referral received:12/04/18 Initial outreach: 01/14/19 Insurance: [employer]    Subjective :  Received return call from Krystal Short, at [place] office for follow up on call to office on 12/9 regarding discharge orders from [facility] Dr. Porfirio Short, Infectious disease requesting weekly Wednesday labs for  galatomanan and cmv to  have PCP office perform and fax results to Infection Disease at [facility] at [phone], [name] or Krystal Short. Krystal Short states per [name] practice is not a reference lab and recommends that infectious disease place orders  to [place] to complete lab work and send results to specialist. Also discussed with Krystal Short patient has home health orders for just home health PT/OT, possibly RN can be added, she discussed since home health orders by hematology, home health would be getting orders from that practice.  - Discussed patient mother Krystal Short,  is managing patient medications, and has questions regarding , medication on list that she was on prior to admission, that she is unsure if she should be taking , Potassium, Magnesium, Protonix and zofran, She states new medications prescribed by [facility] was filled at discharge prior to patient leaving hospital, she has contact hematology office and they recommended contacting PCP regarding upon listed medications .  - Discussed [facility] care management has arranged for samples of Glucerna through [facility] , that can't be delivered to patient home, Krystal Short is agreeable that glucerna can be delivered to [name] office, reviewed that I would update patient/familly.    Care Coordination  Placed call to [facility] , Infection Disease, spoke with Krystal Short , RN regarding [name] discharge instructions   requested weekly Wednesday labs and follow up with PCP office regarding recommending lab order be sent to as [facility] with information on contact information on sending results. I also discussed with RN patient has home health PT/OT orders only with Krystal Short, no home health RN order, discussed possibly of labs being drawn by home health if availability  but will need MD order. Provided [place] home heatlh contact number at [phone].   Placed call to Krystal Short, HIPAA verified x 2 identifiers.  She reports doing okay on today just completed home health physical therapy session . Discussed with Krystal Short discharge instructions for weekly labs recommended by infection disease, to follow up her pneumonia infection condition she states understanding. Reviewed call to PCP and follow up call to infection disease department regarding arranging lab work , discussed recommendation for labs being drawn locally at lab such as [facility], she suggest that I speak with her mother who is busy at the moment. Krystal Short verifies that she has a PICC line in place care provided  at hematology office.  Discussed with Krystal Short as previous arrangement by Krystal Churn, RN, CM , samples of glucerna she is agreeable to supply being delivered to [place] office for pick up.  Discussed will coordinate with Krystal Short and her call when it has been delivered for pick up.   1228 Return call to Krystal Short , patient mother explained follow up call to PCP office, and [facility] infectious disease office as above. She understands labs done yesterday were by hematology and Infection disease has weekly orders to follow up pneumonia. Provided her with contact number for infection disease to schedule patient appointment.   Objective  [place]  [facility] from [place] after hospital admission 10/29/18 -11/02/18)ForHIV AIDS, [place] ( Hemophagocytic  lymphohistiocytosis ) pneumothorax,Respiratory failure, intubation , Aspergillus Fungal Pneumonia, CMV Comorbidities include: HIV AIDS, chronic thrombocytopenia,HLH. This was Admire 8th hospital admission on this year. Shewas discharged to home on 01/11/19 with home health physical and occupational therapy with [place] home [facility].  Assessment  Will benefit from continued outreach for care coordination following discharge.   Plan Will plan follow up call to patient regarding coordination plans for weekly labs recommended by infection disease as new communication received.  Will plan call in the next week as follow up to assess for additional care needs.    [name], RN, [place] Management Coordinator  [phone]- Mobile [phone]- Toll Free Main Office

## 2019-01-18 DIAGNOSIS — R509 Fever, unspecified: Secondary | ICD-10-CM | POA: Diagnosis not present

## 2019-01-18 DIAGNOSIS — J9692 Respiratory failure, unspecified with hypercapnia: Secondary | ICD-10-CM | POA: Diagnosis not present

## 2019-01-18 DIAGNOSIS — A419 Sepsis, unspecified organism: Secondary | ICD-10-CM | POA: Diagnosis not present

## 2019-01-18 DIAGNOSIS — R579 Shock, unspecified: Secondary | ICD-10-CM | POA: Diagnosis not present

## 2019-01-18 DIAGNOSIS — J9602 Acute respiratory failure with hypercapnia: Secondary | ICD-10-CM | POA: Diagnosis not present

## 2019-01-18 DIAGNOSIS — R0902 Hypoxemia: Secondary | ICD-10-CM | POA: Diagnosis not present

## 2019-01-18 DIAGNOSIS — R000 Tachycardia, unspecified: Secondary | ICD-10-CM | POA: Diagnosis not present

## 2019-01-18 DIAGNOSIS — J189 Pneumonia, unspecified organism: Secondary | ICD-10-CM | POA: Diagnosis not present

## 2019-01-18 DIAGNOSIS — Z9911 Dependence on respirator [ventilator] status: Secondary | ICD-10-CM | POA: Diagnosis not present

## 2019-01-18 DIAGNOSIS — D761 Hemophagocytic lymphohistiocytosis: Secondary | ICD-10-CM | POA: Diagnosis not present

## 2019-01-18 DIAGNOSIS — B258 Other cytomegaloviral diseases: Secondary | ICD-10-CM | POA: Diagnosis not present

## 2019-01-18 DIAGNOSIS — E639 Nutritional deficiency, unspecified: Secondary | ICD-10-CM | POA: Diagnosis not present

## 2019-01-18 DIAGNOSIS — Z515 Encounter for palliative care: Secondary | ICD-10-CM | POA: Diagnosis not present

## 2019-01-18 DIAGNOSIS — J159 Unspecified bacterial pneumonia: Secondary | ICD-10-CM | POA: Diagnosis not present

## 2019-01-18 DIAGNOSIS — Z20828 Contact with and (suspected) exposure to other viral communicable diseases: Secondary | ICD-10-CM | POA: Diagnosis not present

## 2019-01-18 DIAGNOSIS — R05 Cough: Secondary | ICD-10-CM | POA: Diagnosis not present

## 2019-01-18 DIAGNOSIS — R7881 Bacteremia: Secondary | ICD-10-CM | POA: Diagnosis not present

## 2019-01-18 DIAGNOSIS — R0682 Tachypnea, not elsewhere classified: Secondary | ICD-10-CM | POA: Diagnosis not present

## 2019-01-18 DIAGNOSIS — J9691 Respiratory failure, unspecified with hypoxia: Secondary | ICD-10-CM | POA: Diagnosis not present

## 2019-01-18 DIAGNOSIS — N179 Acute kidney failure, unspecified: Secondary | ICD-10-CM | POA: Diagnosis not present

## 2019-01-18 DIAGNOSIS — R6521 Severe sepsis with septic shock: Secondary | ICD-10-CM | POA: Diagnosis not present

## 2019-01-18 DIAGNOSIS — E872 Acidosis: Secondary | ICD-10-CM | POA: Diagnosis not present

## 2019-01-18 DIAGNOSIS — R0602 Shortness of breath: Secondary | ICD-10-CM | POA: Diagnosis not present

## 2019-01-18 DIAGNOSIS — B259 Cytomegaloviral disease, unspecified: Secondary | ICD-10-CM | POA: Diagnosis not present

## 2019-01-18 DIAGNOSIS — B449 Aspergillosis, unspecified: Secondary | ICD-10-CM | POA: Diagnosis not present

## 2019-01-18 DIAGNOSIS — J984 Other disorders of lung: Secondary | ICD-10-CM | POA: Diagnosis not present

## 2019-01-18 DIAGNOSIS — E878 Other disorders of electrolyte and fluid balance, not elsewhere classified: Secondary | ICD-10-CM | POA: Diagnosis not present

## 2019-01-18 DIAGNOSIS — J9601 Acute respiratory failure with hypoxia: Secondary | ICD-10-CM | POA: Diagnosis not present

## 2019-01-18 DIAGNOSIS — B20 Human immunodeficiency virus [HIV] disease: Secondary | ICD-10-CM | POA: Diagnosis not present

## 2019-01-18 DIAGNOSIS — J90 Pleural effusion, not elsewhere classified: Secondary | ICD-10-CM | POA: Diagnosis not present

## 2019-01-18 DIAGNOSIS — B441 Other pulmonary aspergillosis: Secondary | ICD-10-CM | POA: Diagnosis not present

## 2019-01-18 DIAGNOSIS — D696 Thrombocytopenia, unspecified: Secondary | ICD-10-CM | POA: Diagnosis not present

## 2019-01-18 DIAGNOSIS — D7389 Other diseases of spleen: Secondary | ICD-10-CM | POA: Diagnosis not present

## 2019-01-18 DIAGNOSIS — J9600 Acute respiratory failure, unspecified whether with hypoxia or hypercapnia: Secondary | ICD-10-CM | POA: Diagnosis not present

## 2019-01-20 MED ORDER — AZITHROMYCIN 250 MG PO TABS
500.00 | ORAL_TABLET | ORAL | Status: DC
Start: 2019-01-22 — End: 2019-01-20

## 2019-01-20 MED ORDER — GLUCOSE 40 % PO GEL
15.00 | ORAL | Status: DC
Start: ? — End: 2019-01-20

## 2019-01-20 MED ORDER — POTASSIUM CHLORIDE CRYS ER 20 MEQ PO TBCR
20.00 | EXTENDED_RELEASE_TABLET | ORAL | Status: DC
Start: 2019-01-21 — End: 2019-01-20

## 2019-01-20 MED ORDER — INFLUENZA VAC SPLIT QUAD 0.5 ML IM SUSY
0.50 | PREFILLED_SYRINGE | INTRAMUSCULAR | Status: DC
Start: ? — End: 2019-01-20

## 2019-01-20 MED ORDER — GENERIC EXTERNAL MEDICATION
15.00 | Status: DC
Start: 2019-01-22 — End: 2019-01-20

## 2019-01-20 MED ORDER — ACETAMINOPHEN 325 MG PO TABS
650.00 | ORAL_TABLET | ORAL | Status: DC
Start: ? — End: 2019-01-20

## 2019-01-20 MED ORDER — OXYCODONE HCL 5 MG PO TABS
5.00 | ORAL_TABLET | ORAL | Status: DC
Start: ? — End: 2019-01-20

## 2019-01-20 MED ORDER — SODIUM CHLORIDE 0.9 % IV SOLN
250.00 | INTRAVENOUS | Status: DC
Start: ? — End: 2019-01-20

## 2019-01-20 MED ORDER — LIDOCAINE 4 % EX PTCH
1.00 | MEDICATED_PATCH | CUTANEOUS | Status: DC
Start: ? — End: 2019-01-20

## 2019-01-20 MED ORDER — MUPIROCIN 2 % EX OINT
TOPICAL_OINTMENT | CUTANEOUS | Status: DC
Start: 2019-01-22 — End: 2019-01-20

## 2019-01-20 MED ORDER — DEXTROSE 10 % IV SOLN
125.00 | INTRAVENOUS | Status: DC
Start: ? — End: 2019-01-20

## 2019-01-20 MED ORDER — VALGANCICLOVIR HCL 450 MG PO TABS
900.00 | ORAL_TABLET | ORAL | Status: DC
Start: 2019-01-23 — End: 2019-01-20

## 2019-01-20 MED ORDER — DERMULCERA EX OINT
1.00 | TOPICAL_OINTMENT | CUTANEOUS | Status: DC
Start: 2019-01-24 — End: 2019-01-20

## 2019-01-20 MED ORDER — GENERIC EXTERNAL MEDICATION
3.38 | Status: DC
Start: 2019-01-20 — End: 2019-01-20

## 2019-01-20 MED ORDER — SULFAMETHOXAZOLE-TRIMETHOPRIM 800-160 MG PO TABS
1.00 | ORAL_TABLET | ORAL | Status: DC
Start: 2019-01-27 — End: 2019-01-20

## 2019-01-20 MED ORDER — CHLORHEXIDINE GLUCONATE 0.12 % MT SOLN
15.00 | OROMUCOSAL | Status: DC
Start: 2019-01-20 — End: 2019-01-20

## 2019-01-20 MED ORDER — BICTEGRAVIR-EMTRICITAB-TENOFOV 50-200-25 MG PO TABS
1.00 | ORAL_TABLET | ORAL | Status: DC
Start: 2019-01-25 — End: 2019-01-20

## 2019-01-20 MED ORDER — PANTOPRAZOLE SODIUM 40 MG PO TBEC
40.00 | DELAYED_RELEASE_TABLET | ORAL | Status: DC
Start: 2019-01-22 — End: 2019-01-20

## 2019-01-20 MED ORDER — MUPIROCIN 2 % EX OINT
TOPICAL_OINTMENT | CUTANEOUS | Status: DC
Start: 2019-01-20 — End: 2019-01-20

## 2019-01-20 MED ORDER — VORICONAZOLE 200 MG PO TABS
200.00 | ORAL_TABLET | ORAL | Status: DC
Start: 2019-01-23 — End: 2019-01-20

## 2019-01-20 MED ORDER — CARBOXYMETHYLCELLULOSE SODIUM 0.5 % OP SOLN
1.00 | OPHTHALMIC | Status: DC
Start: ? — End: 2019-01-20

## 2019-01-20 MED ORDER — MAGNESIUM OXIDE 400 MG PO TABS
400.00 | ORAL_TABLET | ORAL | Status: DC
Start: 2019-01-24 — End: 2019-01-20

## 2019-01-20 MED ORDER — ONDANSETRON HCL 4 MG PO TABS
4.00 | ORAL_TABLET | ORAL | Status: DC
Start: ? — End: 2019-01-20

## 2019-01-20 MED ORDER — CHLORHEXIDINE GLUCONATE 0.12 % MT SOLN
15.00 | OROMUCOSAL | Status: DC
Start: ? — End: 2019-01-20

## 2019-01-21 ENCOUNTER — Ambulatory Visit: Payer: Self-pay | Admitting: *Deleted

## 2019-01-21 MED ORDER — GENERIC EXTERNAL MEDICATION
1.00 | Status: DC
Start: 2019-01-21 — End: 2019-01-21

## 2019-01-21 MED ORDER — GENERIC EXTERNAL MEDICATION
Status: DC
Start: ? — End: 2019-01-21

## 2019-01-21 MED ORDER — IPRATROPIUM-ALBUTEROL 0.5-2.5 (3) MG/3ML IN SOLN
3.00 | RESPIRATORY_TRACT | Status: DC
Start: 2019-01-21 — End: 2019-01-21

## 2019-01-22 MED ORDER — CHLORHEXIDINE GLUCONATE 0.12 % MT SOLN
15.00 | OROMUCOSAL | Status: DC
Start: 2019-01-24 — End: 2019-01-22

## 2019-01-22 MED ORDER — FENTANYL CITRATE (PF) 50 MCG/ML IJ SOLN
25.00 | INTRAMUSCULAR | Status: DC
Start: ? — End: 2019-01-22

## 2019-01-22 MED ORDER — NOREPINEPHRINE-SODIUM CHLORIDE 16-0.9 MG/250ML-% IV SOLN
0.00 | INTRAVENOUS | Status: DC
Start: ? — End: 2019-01-22

## 2019-01-22 MED ORDER — GENERIC EXTERNAL MEDICATION
40.00 | Status: DC
Start: 2019-01-25 — End: 2019-01-22

## 2019-01-22 MED ORDER — IPRATROPIUM-ALBUTEROL 0.5-2.5 (3) MG/3ML IN SOLN
3.00 | RESPIRATORY_TRACT | Status: DC
Start: ? — End: 2019-01-22

## 2019-01-22 MED ORDER — GENERIC EXTERNAL MEDICATION
3.38 | Status: DC
Start: 2019-01-24 — End: 2019-01-22

## 2019-01-22 MED ORDER — CHLORHEXIDINE GLUCONATE 0.12 % MT SOLN
15.00 | OROMUCOSAL | Status: DC
Start: ? — End: 2019-01-22

## 2019-01-23 MED ORDER — VANCOMYCIN HCL IN DEXTROSE 750-5 MG/150ML-% IV SOLN
750.00 | INTRAVENOUS | Status: DC
Start: 2019-01-24 — End: 2019-01-23

## 2019-01-23 MED ORDER — VALGANCICLOVIR HCL 450 MG PO TABS
450.00 | ORAL_TABLET | ORAL | Status: DC
Start: 2019-01-25 — End: 2019-01-23

## 2019-01-23 MED ORDER — GENERIC EXTERNAL MEDICATION
Status: DC
Start: ? — End: 2019-01-23

## 2019-01-24 ENCOUNTER — Other Ambulatory Visit: Payer: Self-pay | Admitting: *Deleted

## 2019-01-24 MED ORDER — VORICONAZOLE 200 MG PO TABS
200.00 | ORAL_TABLET | ORAL | Status: DC
Start: 2019-01-24 — End: 2019-01-24

## 2019-01-24 NOTE — Patient Outreach (Signed)
[facility]) Care Management  01/24/2019  Layce Ryall May 03, 1985 [id number]   Care Coordination  Patient readmission    Noted patient readmission to [facility] on 01/18/19, Dx : Sepsis, Acute Respiratory Failure.   PMHx:Charlei was hospitalized at [facility]  From 11/02/18 to 01/11/19 ( Transferred from [place] after hospital admission 10/29/18 -11/02/18)  For HIV AIDS, [place] ( Hemophagocytic lymphohistiocytosis ) pneumothorax,Respiratory failure, intubation ,  Aspergillus  Fungal Pneumonia, CMV  Comorbidities include: HIV AIDS,  chronic thrombocytopenia,HLH    Plan  Will follow progress and disposition plans for transition of care .    [name], RN, [place] Management Coordinator  [phone]- Mobile [phone]- Toll Free Main Office

## 2019-01-27 ENCOUNTER — Other Ambulatory Visit: Payer: Self-pay | Admitting: *Deleted

## 2019-01-27 MED ORDER — FENTANYL CITRATE (PF) 50 MCG/ML IJ SOLN
0.00 | INTRAMUSCULAR | Status: DC
Start: ? — End: 2019-01-27

## 2019-01-27 MED ORDER — LORAZEPAM 2 MG/ML IJ SOLN
1.00 | INTRAMUSCULAR | Status: DC
Start: ? — End: 2019-01-27

## 2019-01-27 MED ORDER — GENERIC EXTERNAL MEDICATION
25.00 | Status: DC
Start: ? — End: 2019-01-27

## 2019-01-27 MED ORDER — GLYCOPYRROLATE 0.4 MG/2ML IJ SOLN
0.20 | INTRAMUSCULAR | Status: DC
Start: ? — End: 2019-01-27

## 2019-01-27 NOTE — Patient Outreach (Signed)
[facility]) Care Management  30-Jan-2019  Krystal Short [birth year] [id number]   Case Closure     Noted patient readmission to [facility] on 01/18/19, Dx : Sepsis, Acute Respiratory Failure.   [facility] Transferred from [place] after hospital admission 10/29/18 -11/02/18)ForHIV AIDS, [place] ( Hemophagocytic lymphohistiocytosis ) pneumothorax,Respiratory failure, intubation , Aspergillus Fungal Pneumonia, CMV Comorbidities include: HIV AIDS, chronic thrombocytopenia,HLH  01/30/2019 per care everywhere noted patient death on January 30, 2019 at 18 at [place] Will close case to [facility] care management services.    [name], RN,BSN, [place] Management Coordinator  [phone]- Mobile [phone]- Toll Free Main Office

## 2019-02-07 DEATH — deceased

## 2019-03-26 LAB — BLOOD GAS, ARTERIAL
Bicarbonate: 14.5 mmol/L — ABNORMAL LOW (ref 20.0–28.0)
Drawn by: 25788
FIO2: 70
MECHVT: 380 mL
O2 Saturation: 90.7 %
PEEP: 12 cmH2O
RATE: 30 resp/min
pCO2 arterial: 48.6 mmHg — ABNORMAL HIGH (ref 32.0–48.0)
pH, Arterial: 7.127 — CL (ref 7.350–7.450)
pO2, Arterial: 67.8 mmHg — ABNORMAL LOW (ref 83.0–108.0)

## 2022-01-19 ENCOUNTER — Encounter: Payer: Self-pay | Admitting: Hematology

## 2022-01-21 ENCOUNTER — Encounter: Payer: Self-pay | Admitting: Hematology
